# Patient Record
Sex: Male | Born: 1945
Health system: Southern US, Community
[De-identification: ages and names within clinical notes are randomized; demographics above are authoritative.]

## PROBLEM LIST (undated history)

## (undated) ENCOUNTER — Telehealth: Attending: Internal Medicine | Primary: Internal Medicine

## (undated) ENCOUNTER — Ambulatory Visit: Payer: MEDICARE

## (undated) ENCOUNTER — Ambulatory Visit

## (undated) ENCOUNTER — Encounter: Attending: Pharmacist | Primary: Pharmacist

## (undated) ENCOUNTER — Encounter: Attending: Oncology | Primary: Oncology

## (undated) ENCOUNTER — Encounter: Attending: Adult Health | Primary: Adult Health

## (undated) ENCOUNTER — Encounter: Attending: Internal Medicine | Primary: Internal Medicine

## (undated) ENCOUNTER — Telehealth: Attending: Adult Health | Primary: Adult Health

## (undated) ENCOUNTER — Telehealth

## (undated) ENCOUNTER — Encounter

## (undated) ENCOUNTER — Ambulatory Visit: Payer: MEDICARE | Attending: Internal Medicine | Primary: Internal Medicine

## (undated) ENCOUNTER — Ambulatory Visit: Payer: MEDICARE | Attending: Adult Health | Primary: Adult Health

## (undated) ENCOUNTER — Telehealth: Attending: Oncology | Primary: Oncology

## (undated) ENCOUNTER — Telehealth: Attending: Pharmacist | Primary: Pharmacist

## (undated) DIAGNOSIS — I4510 Unspecified right bundle-branch block: Secondary | ICD-10-CM

## (undated) DIAGNOSIS — C801 Malignant (primary) neoplasm, unspecified: Secondary | ICD-10-CM

## (undated) DIAGNOSIS — H409 Unspecified glaucoma: Secondary | ICD-10-CM

## (undated) DIAGNOSIS — N400 Enlarged prostate without lower urinary tract symptoms: Secondary | ICD-10-CM

## (undated) DIAGNOSIS — K5090 Crohn's disease, unspecified, without complications: Secondary | ICD-10-CM

## (undated) DIAGNOSIS — Z8619 Personal history of other infectious and parasitic diseases: Secondary | ICD-10-CM

## (undated) DIAGNOSIS — M109 Gout, unspecified: Secondary | ICD-10-CM

## (undated) DIAGNOSIS — D759 Disease of blood and blood-forming organs, unspecified: Secondary | ICD-10-CM

## (undated) DIAGNOSIS — R040 Epistaxis: Secondary | ICD-10-CM

## (undated) DIAGNOSIS — D7581 Myelofibrosis: Secondary | ICD-10-CM

## (undated) DIAGNOSIS — N189 Chronic kidney disease, unspecified: Secondary | ICD-10-CM

## (undated) HISTORY — DX: Unspecified glaucoma: H40.9

## (undated) HISTORY — DX: Personal history of other infectious and parasitic diseases: Z86.19

## (undated) HISTORY — DX: Crohn's disease, unspecified, without complications: K50.90

## (undated) HISTORY — DX: Benign prostatic hyperplasia without lower urinary tract symptoms: N40.0

## (undated) HISTORY — PX: MOHS SURGERY: SHX181

## (undated) HISTORY — PX: OTHER SURGICAL HISTORY: SHX169

## (undated) HISTORY — DX: Unspecified right bundle-branch block: I45.10

## (undated) HISTORY — DX: Gout, unspecified: M10.9

## (undated) MED ORDER — FOLIC ACID 1 MG TABLET: Freq: Every day | ORAL | 0 days | Status: SS

## (undated) MED ORDER — POSACONAZOLE ORAL
Freq: Every day | ORAL | 0 days
Start: ? — End: 2020-05-14

## (undated) MED ORDER — OMEGA 3-DHA-EPA-FISH OIL 300 MG-1,000 MG CAPSULE: Freq: Every day | ORAL | 0 days | Status: SS

## (undated) MED ORDER — CENTRUM SILVER ULTRA MEN'S 300 MCG-600 MCG-300 MCG TABLET: Freq: Every day | ORAL | 0.00000 days | Status: SS

## (undated) MED ORDER — CYANOCOBALAMIN (VIT B-12) 1,000 MCG TABLET: Freq: Every day | ORAL | 0 days

## (undated) MED ORDER — MIDODRINE 5 MG TABLET: Freq: Three times a day (TID) | ORAL | 0 days

## (undated) MED ORDER — ASPIRIN 81 MG CHEWABLE TABLET: ORAL | 0 days

## (undated) MED ORDER — POTASSIUM CHLORIDE ER 10 MEQ TABLET,EXTENDED RELEASE(PART/CRYST): Freq: Every evening | ORAL | 0.00000 days

## (undated) MED ORDER — CHOLECALCIFEROL (VITAMIN D3) 25 MCG (1,000 UNIT) TABLET: Freq: Every day | ORAL | 0 days | Status: SS

## (undated) MED ORDER — POTASSIUM CHLORIDE ER 20 MEQ TABLET,EXTENDED RELEASE(PART/CRYST): Freq: Every day | ORAL | 0 days

---

## 2006-09-22 HISTORY — PX: PROSTATE SURGERY: SHX751

## 2011-05-27 ENCOUNTER — Ambulatory Visit: Payer: Self-pay

## 2011-06-05 ENCOUNTER — Ambulatory Visit: Payer: Self-pay | Admitting: Family Medicine

## 2011-08-08 ENCOUNTER — Ambulatory Visit: Payer: Self-pay | Admitting: Unknown Physician Specialty

## 2011-08-08 LAB — HM COLONOSCOPY

## 2011-08-11 LAB — PATHOLOGY REPORT

## 2012-05-03 DIAGNOSIS — Z1159 Encounter for screening for other viral diseases: Secondary | ICD-10-CM | POA: Diagnosis not present

## 2012-05-03 DIAGNOSIS — Z136 Encounter for screening for cardiovascular disorders: Secondary | ICD-10-CM | POA: Diagnosis not present

## 2012-05-03 DIAGNOSIS — Z131 Encounter for screening for diabetes mellitus: Secondary | ICD-10-CM | POA: Diagnosis not present

## 2012-05-03 DIAGNOSIS — I4510 Unspecified right bundle-branch block: Secondary | ICD-10-CM | POA: Insufficient documentation

## 2012-05-03 DIAGNOSIS — Z0000 Encounter for general adult medical examination without abnormal findings: Secondary | ICD-10-CM | POA: Diagnosis not present

## 2012-05-03 DIAGNOSIS — Z23 Encounter for immunization: Secondary | ICD-10-CM | POA: Diagnosis not present

## 2012-05-03 DIAGNOSIS — Z125 Encounter for screening for malignant neoplasm of prostate: Secondary | ICD-10-CM | POA: Diagnosis not present

## 2012-05-05 DIAGNOSIS — Z85828 Personal history of other malignant neoplasm of skin: Secondary | ICD-10-CM | POA: Diagnosis not present

## 2012-05-05 DIAGNOSIS — L570 Actinic keratosis: Secondary | ICD-10-CM | POA: Diagnosis not present

## 2012-05-10 ENCOUNTER — Ambulatory Visit: Payer: Self-pay | Admitting: Family Medicine

## 2012-05-10 DIAGNOSIS — Z139 Encounter for screening, unspecified: Secondary | ICD-10-CM | POA: Diagnosis not present

## 2012-05-10 DIAGNOSIS — Z136 Encounter for screening for cardiovascular disorders: Secondary | ICD-10-CM | POA: Diagnosis not present

## 2012-06-18 DIAGNOSIS — Z23 Encounter for immunization: Secondary | ICD-10-CM | POA: Diagnosis not present

## 2012-07-14 DIAGNOSIS — H4010X0 Unspecified open-angle glaucoma, stage unspecified: Secondary | ICD-10-CM | POA: Diagnosis not present

## 2012-08-24 ENCOUNTER — Emergency Department: Payer: Self-pay | Admitting: Emergency Medicine

## 2012-08-24 DIAGNOSIS — R109 Unspecified abdominal pain: Secondary | ICD-10-CM | POA: Diagnosis not present

## 2012-08-24 DIAGNOSIS — K5090 Crohn's disease, unspecified, without complications: Secondary | ICD-10-CM | POA: Diagnosis not present

## 2012-08-24 DIAGNOSIS — R112 Nausea with vomiting, unspecified: Secondary | ICD-10-CM | POA: Diagnosis not present

## 2012-08-24 DIAGNOSIS — D72829 Elevated white blood cell count, unspecified: Secondary | ICD-10-CM | POA: Diagnosis not present

## 2012-08-24 LAB — CBC
HCT: 59.8 % — ABNORMAL HIGH (ref 40.0–52.0)
HGB: 19.3 g/dL — ABNORMAL HIGH (ref 13.0–18.0)
MCH: 27.1 pg (ref 26.0–34.0)
MCHC: 32.4 g/dL (ref 32.0–36.0)
Platelet: 551 10*3/uL — ABNORMAL HIGH (ref 150–440)
WBC: 21.8 10*3/uL — ABNORMAL HIGH (ref 3.8–10.6)

## 2012-08-24 LAB — COMPREHENSIVE METABOLIC PANEL
Albumin: 4.1 g/dL (ref 3.4–5.0)
Alkaline Phosphatase: 155 U/L — ABNORMAL HIGH (ref 50–136)
Calcium, Total: 9.6 mg/dL (ref 8.5–10.1)
Chloride: 108 mmol/L — ABNORMAL HIGH (ref 98–107)
Co2: 21 mmol/L (ref 21–32)
EGFR (African American): >60
Glucose: 113 mg/dL — ABNORMAL HIGH (ref 65–99)
Osmolality: 277 (ref 275–301)
Potassium: 4.2 mmol/L (ref 3.5–5.1)
SGOT(AST): 42 U/L — ABNORMAL HIGH (ref 15–37)
SGPT (ALT): 41 U/L (ref 12–78)
Sodium: 137 mmol/L (ref 136–145)

## 2012-08-24 LAB — URINALYSIS, COMPLETE
Bilirubin,UR: NEGATIVE
Blood: NEGATIVE
Glucose,UR: NEGATIVE mg/dL (ref 0–75)
Leukocyte Esterase: NEGATIVE
Nitrite: NEGATIVE
RBC,UR: 4 /HPF (ref 0–5)
Specific Gravity: 1.021 (ref 1.003–1.030)

## 2012-08-24 LAB — LIPASE, BLOOD: Lipase: 168 U/L (ref 73–393)

## 2012-08-27 DIAGNOSIS — K5090 Crohn's disease, unspecified, without complications: Secondary | ICD-10-CM | POA: Diagnosis not present

## 2013-10-12 DIAGNOSIS — D485 Neoplasm of uncertain behavior of skin: Secondary | ICD-10-CM | POA: Diagnosis not present

## 2013-10-12 DIAGNOSIS — Z85828 Personal history of other malignant neoplasm of skin: Secondary | ICD-10-CM | POA: Diagnosis not present

## 2013-10-12 DIAGNOSIS — L570 Actinic keratosis: Secondary | ICD-10-CM | POA: Diagnosis not present

## 2013-10-25 DIAGNOSIS — H4010X0 Unspecified open-angle glaucoma, stage unspecified: Secondary | ICD-10-CM | POA: Diagnosis not present

## 2013-11-01 DIAGNOSIS — C44320 Squamous cell carcinoma of skin of unspecified parts of face: Secondary | ICD-10-CM | POA: Diagnosis not present

## 2013-12-01 ENCOUNTER — Ambulatory Visit: Payer: Self-pay | Admitting: Family Medicine

## 2013-12-01 DIAGNOSIS — M19079 Primary osteoarthritis, unspecified ankle and foot: Secondary | ICD-10-CM | POA: Diagnosis not present

## 2013-12-01 DIAGNOSIS — M25579 Pain in unspecified ankle and joints of unspecified foot: Secondary | ICD-10-CM | POA: Diagnosis not present

## 2013-12-01 DIAGNOSIS — D1630 Benign neoplasm of short bones of unspecified lower limb: Secondary | ICD-10-CM | POA: Diagnosis not present

## 2013-12-01 DIAGNOSIS — Z125 Encounter for screening for malignant neoplasm of prostate: Secondary | ICD-10-CM | POA: Diagnosis not present

## 2013-12-01 DIAGNOSIS — M79609 Pain in unspecified limb: Secondary | ICD-10-CM | POA: Diagnosis not present

## 2013-12-28 DIAGNOSIS — M76829 Posterior tibial tendinitis, unspecified leg: Secondary | ICD-10-CM | POA: Diagnosis not present

## 2013-12-28 DIAGNOSIS — M109 Gout, unspecified: Secondary | ICD-10-CM | POA: Diagnosis not present

## 2014-01-04 DIAGNOSIS — M76829 Posterior tibial tendinitis, unspecified leg: Secondary | ICD-10-CM | POA: Diagnosis not present

## 2014-01-04 DIAGNOSIS — M109 Gout, unspecified: Secondary | ICD-10-CM | POA: Diagnosis not present

## 2014-01-19 DIAGNOSIS — M76829 Posterior tibial tendinitis, unspecified leg: Secondary | ICD-10-CM | POA: Diagnosis not present

## 2014-01-19 DIAGNOSIS — M25579 Pain in unspecified ankle and joints of unspecified foot: Secondary | ICD-10-CM | POA: Diagnosis not present

## 2014-01-30 DIAGNOSIS — Z85828 Personal history of other malignant neoplasm of skin: Secondary | ICD-10-CM | POA: Diagnosis not present

## 2014-01-30 DIAGNOSIS — L570 Actinic keratosis: Secondary | ICD-10-CM | POA: Diagnosis not present

## 2014-02-06 DIAGNOSIS — M76829 Posterior tibial tendinitis, unspecified leg: Secondary | ICD-10-CM | POA: Diagnosis not present

## 2014-02-06 DIAGNOSIS — M25579 Pain in unspecified ankle and joints of unspecified foot: Secondary | ICD-10-CM | POA: Diagnosis not present

## 2014-04-10 ENCOUNTER — Ambulatory Visit: Payer: Self-pay | Admitting: Family Medicine

## 2014-04-10 DIAGNOSIS — Z23 Encounter for immunization: Secondary | ICD-10-CM | POA: Diagnosis not present

## 2014-04-10 DIAGNOSIS — Z125 Encounter for screening for malignant neoplasm of prostate: Secondary | ICD-10-CM | POA: Diagnosis not present

## 2014-04-10 DIAGNOSIS — M549 Dorsalgia, unspecified: Secondary | ICD-10-CM | POA: Diagnosis not present

## 2014-04-10 DIAGNOSIS — Z131 Encounter for screening for diabetes mellitus: Secondary | ICD-10-CM | POA: Diagnosis not present

## 2014-04-10 DIAGNOSIS — M5137 Other intervertebral disc degeneration, lumbosacral region: Secondary | ICD-10-CM | POA: Diagnosis not present

## 2014-04-10 DIAGNOSIS — N419 Inflammatory disease of prostate, unspecified: Secondary | ICD-10-CM | POA: Diagnosis not present

## 2014-04-10 DIAGNOSIS — Z0000 Encounter for general adult medical examination without abnormal findings: Secondary | ICD-10-CM | POA: Diagnosis not present

## 2014-04-10 LAB — PSA: PSA: 2.1

## 2014-04-10 LAB — BASIC METABOLIC PANEL: GLUCOSE: 81 mg/dL

## 2014-05-02 DIAGNOSIS — H4010X0 Unspecified open-angle glaucoma, stage unspecified: Secondary | ICD-10-CM | POA: Diagnosis not present

## 2014-06-13 ENCOUNTER — Ambulatory Visit: Payer: Self-pay | Admitting: Physician Assistant

## 2014-06-13 DIAGNOSIS — L02619 Cutaneous abscess of unspecified foot: Secondary | ICD-10-CM | POA: Diagnosis not present

## 2014-06-13 DIAGNOSIS — L03119 Cellulitis of unspecified part of limb: Secondary | ICD-10-CM | POA: Diagnosis not present

## 2014-06-23 DIAGNOSIS — Z23 Encounter for immunization: Secondary | ICD-10-CM | POA: Diagnosis not present

## 2014-10-02 DIAGNOSIS — L821 Other seborrheic keratosis: Secondary | ICD-10-CM | POA: Diagnosis not present

## 2014-10-02 DIAGNOSIS — Z85828 Personal history of other malignant neoplasm of skin: Secondary | ICD-10-CM | POA: Diagnosis not present

## 2014-10-02 DIAGNOSIS — D485 Neoplasm of uncertain behavior of skin: Secondary | ICD-10-CM | POA: Diagnosis not present

## 2014-10-02 DIAGNOSIS — C44319 Basal cell carcinoma of skin of other parts of face: Secondary | ICD-10-CM | POA: Diagnosis not present

## 2014-10-02 DIAGNOSIS — X32XXXA Exposure to sunlight, initial encounter: Secondary | ICD-10-CM | POA: Diagnosis not present

## 2014-10-02 DIAGNOSIS — L570 Actinic keratosis: Secondary | ICD-10-CM | POA: Diagnosis not present

## 2014-10-16 DIAGNOSIS — L570 Actinic keratosis: Secondary | ICD-10-CM | POA: Diagnosis not present

## 2014-10-31 DIAGNOSIS — H4011X1 Primary open-angle glaucoma, mild stage: Secondary | ICD-10-CM | POA: Diagnosis not present

## 2014-11-15 DIAGNOSIS — L570 Actinic keratosis: Secondary | ICD-10-CM | POA: Diagnosis not present

## 2015-01-01 DIAGNOSIS — L814 Other melanin hyperpigmentation: Secondary | ICD-10-CM | POA: Diagnosis not present

## 2015-01-01 DIAGNOSIS — L908 Other atrophic disorders of skin: Secondary | ICD-10-CM | POA: Diagnosis not present

## 2015-01-01 DIAGNOSIS — C44219 Basal cell carcinoma of skin of left ear and external auricular canal: Secondary | ICD-10-CM | POA: Diagnosis not present

## 2015-01-01 DIAGNOSIS — L578 Other skin changes due to chronic exposure to nonionizing radiation: Secondary | ICD-10-CM | POA: Diagnosis not present

## 2015-02-22 DIAGNOSIS — K5010 Crohn's disease of large intestine without complications: Secondary | ICD-10-CM | POA: Diagnosis not present

## 2015-02-22 DIAGNOSIS — K5090 Crohn's disease, unspecified, without complications: Secondary | ICD-10-CM | POA: Diagnosis not present

## 2015-02-26 DIAGNOSIS — K5090 Crohn's disease, unspecified, without complications: Secondary | ICD-10-CM | POA: Diagnosis not present

## 2015-03-06 ENCOUNTER — Telehealth: Payer: Self-pay | Admitting: Family Medicine

## 2015-03-06 DIAGNOSIS — D751 Secondary polycythemia: Secondary | ICD-10-CM

## 2015-03-06 NOTE — Telephone Encounter (Signed)
Pt called because Dr. Edd Fabian office told pt they were sending [place] his labs results to see if Dr. Caryn Section felt they needed further review or if pt should go to a specialist. Pt stated he was concerned and wanted to know if he should come in for an OV or what Dr. Caryn Section thinks about the results. Thanks TNP

## 2015-03-06 NOTE — Telephone Encounter (Signed)
Please advise 

## 2015-03-06 NOTE — Telephone Encounter (Signed)
Please refer to hematology for evaluation of polycythemia. Please send note from [facility] from 02-26-15. Thanks.

## 2015-03-14 ENCOUNTER — Ambulatory Visit: Payer: Self-pay | Admitting: Hematology and Oncology

## 2015-03-27 ENCOUNTER — Telehealth: Payer: Self-pay | Admitting: Family Medicine

## 2015-03-28 ENCOUNTER — Encounter: Payer: Self-pay | Admitting: Family Medicine

## 2015-03-28 ENCOUNTER — Inpatient Hospital Stay: Payer: Medicare Other

## 2015-03-28 ENCOUNTER — Inpatient Hospital Stay: Payer: Medicare Other | Attending: Hematology and Oncology | Admitting: Hematology and Oncology

## 2015-03-28 ENCOUNTER — Encounter: Payer: Self-pay | Admitting: Hematology and Oncology

## 2015-03-28 VITALS — BP 146/82 | HR 71 | Temp 96.0°F | Resp 19 | Ht 72.0 in | Wt 178.5 lb

## 2015-03-28 DIAGNOSIS — D751 Secondary polycythemia: Secondary | ICD-10-CM | POA: Diagnosis not present

## 2015-03-28 DIAGNOSIS — K5090 Crohn's disease, unspecified, without complications: Secondary | ICD-10-CM | POA: Diagnosis not present

## 2015-03-28 DIAGNOSIS — R634 Abnormal weight loss: Secondary | ICD-10-CM | POA: Diagnosis not present

## 2015-03-28 DIAGNOSIS — Z148 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: Secondary | ICD-10-CM | POA: Diagnosis not present

## 2015-03-28 DIAGNOSIS — R61 Generalized hyperhidrosis: Secondary | ICD-10-CM | POA: Insufficient documentation

## 2015-03-28 DIAGNOSIS — Z79899 Other long term (current) drug therapy: Secondary | ICD-10-CM | POA: Insufficient documentation

## 2015-03-28 DIAGNOSIS — Z87891 Personal history of nicotine dependence: Secondary | ICD-10-CM | POA: Diagnosis not present

## 2015-03-28 LAB — CBC WITH DIFFERENTIAL/PLATELET
Basophils Absolute: 0.1 10*3/uL (ref 0–0.1)
Basophils Relative: 0 %
Eosinophils Absolute: 0.1 10*3/uL (ref 0–0.7)
Eosinophils Relative: 1 %
HCT: 62.1 % — ABNORMAL HIGH (ref 40.0–52.0)
Hemoglobin: 19.8 g/dL — ABNORMAL HIGH (ref 13.0–18.0)
Lymphocytes Relative: 10 %
Lymphs Abs: 1.2 10*3/uL (ref 1.0–3.6)
MCH: 25.1 pg — ABNORMAL LOW (ref 26.0–34.0)
MCHC: 31.9 g/dL — ABNORMAL LOW (ref 32.0–36.0)
MCV: 78.8 fL — ABNORMAL LOW (ref 80.0–100.0)
Monocytes Absolute: 1.5 10*3/uL — ABNORMAL HIGH (ref 0.2–1.0)
Monocytes Relative: 12 %
Neutro Abs: 9.4 10*3/uL — ABNORMAL HIGH (ref 1.4–6.5)
Neutrophils Relative %: 77 %
Platelets: 269 10*3/uL (ref 150–440)
RBC: 7.88 MIL/uL — ABNORMAL HIGH (ref 4.40–5.90)
RDW: 20.5 % — ABNORMAL HIGH (ref 11.5–14.5)
WBC: 12.3 10*3/uL — ABNORMAL HIGH (ref 3.8–10.6)

## 2015-03-28 NOTE — Progress Notes (Signed)
[place] [facility] day:  03/28/2015  Chief Complaint: Ruben [name] is a 69 y.o. male with polycythemia who is referred in consultation by Dr. Lelon Huh.  HPI: The patient has blood checked once a year by his primary care physician. Labs on 02/22/2015 revealed a hematocrit greater than 60, hemoglobin 19.6, platelets 298,000, and white count 13,700 with an [place] of 9720. Differential included 71% segs, 10% lymphs, 18% monocytes, and 1% eosinophils. Comprehensive metabolic panel revealed a creatinine of 1.2.  Alkaline phosphatase was slightly elevated at 100 (34-104).   Iron saturation was 17%.  TIBCwas 344 (normal).  Ferritin was 45. 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  Review of available labs notes a hematocrit of 59.8 and a hemoglobin of 19.3 on 08/24/2012.  The patient notes a history of regular blood donation at the [facility] in the past. He stopped donating blood about 6-7 years ago. He states that he has taken iron for years. He has also taken B12 a long time (since his diagnosis of Crohn's disease 28 years ago).   Symptomatically, he sweats a lot with working. He denies any respiratory symptoms. He has a 1-1/2 pack per week smoking history for 20 years (off-and-on).  He occasionally snores, but denies sleep apnea. He denies any cardiac history.  He denies any use of testosterone.  He has lost approximately 40 pounds since retirement. He has been more active with an improved diet. His weight has stabilized.  Past Medical History  Diagnosis Date  . Glaucoma   . Crohn's disease     No past surgical history on file.  Family History  Problem Relation Age of Onset  . Cancer Mother     Liver  . Cancer Father     Prostate  . Cancer Sister     Hodgkins lymphoma  . Cancer Paternal Aunt     Breast    Social History:  reports that he quit smoking about 11 years ago. His  smoking use included Cigarettes. He has a 5 pack-year smoking history. He does not have any smokeless tobacco history on file. He reports that he drinks about 1.2 oz of alcohol per week. He reports that he does not use illicit drugs.  He previously worked for [employer].  He volunteers for [employer].  The patient is accompanied by his wife, [name], today.  Allergies: No Known Allergies  Current Medications: Current Outpatient Prescriptions  Medication Sig Dispense Refill  . Cholecalciferol (VITAMIN D3) 1000 UNITS CAPS Take by mouth.    . Cyanocobalamin (RA VITAMIN B-12 TR) 1000 MCG TBCR Place one tablet under tongue once a day as needed.    . ferrous sulfate 325 (65 FE) MG tablet Take by mouth.    . folic acid (FOLVITE) 1 MG tablet TAKE 1 TABLET (1,000 MCG TOTAL) BY MOUTH ONCE DAILY. MUST KEEP APPT FOR FURTHER REFILLS.    Marland Kitchen latanoprost (XALATAN) 0.005 % ophthalmic solution Apply to eye.    . Multiple Vitamins-Minerals (CENTRUM SILVER ULTRA MENS) TABS Take by mouth.    . Omega-3 Fatty Acids (KP FISH OIL) 1200 MG CAPS Take by mouth.     No current facility-administered medications for this visit.    Review of Systems:  GENERAL:  Feels good.  Active.  No fevers or sweats.  Weight loss from 220# to 178# after retirement. PERFORMANCE STATUS (ECOG):  0 HEENT:  Runny nose secondary to  pollen.  No visual changes, sore throat, mouth sores or tenderness. Lungs: No shortness of breath or cough.  No hemoptysis. Cardiac:  No chest pain, palpitations, orthopnea, or PND. GI:  Diarrhea on occasion.  No nausea, vomiting, constipation, melena or hematochezia. GU:  No urgency, frequency, dysuria, or hematuria. Musculoskeletal:  No back pain.  Joints stiff at times.  No muscle tenderness. Extremities:  No pain or swelling. Skin:  Red face with working.  No rashes or skin changes. Neuro:  Occasional tingle in toes.  No headache, numbness or weakness, balance or coordination issues. Endocrine:   No diabetes, thyroid issues, hot flashes or night sweats. Psych:  No mood changes, depression or anxiety. Pain:  No focal pain. Review of systems:  All other systems reviewed and found to be negative.   Physical Exam: Blood pressure 146/82, pulse 71, temperature 96 F (35.6 C), temperature source Oral, resp. rate 19, height 6' (1.829 m), weight 178 lb 7.4 oz (80.95 kg), SpO2 96 %. GENERAL:  Well developed, well nourished, sitting comfortably in the exam room in no acute distress. MENTAL STATUS:  Alert and oriented to person, place and time. HEAD:  Pearline Cables hair.  Normocephalic, atraumatic, face symmetric, no Cushingoid features. EYES:  Glasses.  Blue eyes.  Pupils equal round and reactive to light and accomodation.  No conjunctivitis or scleral icterus. ENT:  Oropharynx clear without lesion.  Tongue normal. Mucous membranes moist.  RESPIRATORY:  Clear to auscultation without rales, wheezes or rhonchi. CARDIOVASCULAR:  Regular rate and rhythm without murmur, rub or gallop. ABDOMEN:  Soft, non-tender, with active bowel sounds, and no hepatosplenomegaly.  No masses. SKIN:  No rashes, ulcers or lesions. EXTREMITIES: No edema, no skin discoloration or tenderness.  No palpable cords. LYMPH NODES: No palpable cervical, supraclavicular, axillary or inguinal adenopathy  NEUROLOGICAL: Unremarkable. PSYCH:  Appropriate.   Appointment on 03/28/2015  Component Date Value Ref Range Status  . WBC 03/28/2015 12.3* 3.8 - 10.6 K/uL Final  . RBC 03/28/2015 7.88* 4.40 - 5.90 MIL/uL Final  . Hemoglobin 03/28/2015 19.8* 13.0 - 18.0 g/dL Final  . HCT 03/28/2015 62.1* 40.0 - 52.0 % Final  . MCV 03/28/2015 78.8* 80.0 - 100.0 fL Final  . MCH 03/28/2015 25.1* 26.0 - 34.0 pg Final  . MCHC 03/28/2015 31.9* 32.0 - 36.0 g/dL Final  . RDW 03/28/2015 20.5* 11.5 - 14.5 % Final  . Platelets 03/28/2015 269  150 - 440 K/uL Final  . Neutrophils Relative % 03/28/2015 77   Final  . Neutro Abs 03/28/2015 9.4* 1.4 - 6.5 K/uL  Final  . Lymphocytes Relative 03/28/2015 10   Final  . Lymphs Abs 03/28/2015 1.2  1.0 - 3.6 K/uL Final  . Monocytes Relative 03/28/2015 12   Final  . Monocytes Absolute 03/28/2015 1.5* 0.2 - 1.0 K/uL Final  . Eosinophils Relative 03/28/2015 1   Final  . Eosinophils Absolute 03/28/2015 0.1  0 - 0.7 K/uL Final  . Basophils Relative 03/28/2015 0   Final  . Basophils Absolute 03/28/2015 0.1  0 - 0.1 K/uL Final  . [facility] test code 03/28/2015 7187   Final  . [facility] test name 03/28/2015 CARBOXYHEMOGLOBIN   Final  . Misc [facility] result 03/28/2015 COMMENT   Final   Comment: (NOTE) Test Ordered: [id number] Carbon Monoxide, Blood Carbon Monoxide, Blood         2.6         [H ] %        BN     Reference Range:  0.0-1.9                                                          Environmental Exposure:                             Nonsmokers           <2.0                             Smokers              <9.0                            Occupational Exposure:                             BEI                   3.5                                Detection Limit =  0.2 Performed At: [facility] [address] [place], [place] [id number] Lindon Romp MD VW:[id number]     Assessment:  Ruben [name] is a 69 y.o. male with polycythemia dating back to 2013.  He has a minimal smoking history.  He denies any sleep apnea.  He has no known cardiac abnormalities (no exercise intolerance).  He does not take testosterone.  He previously donated blood at the [facility] (none in past 6-7 years).  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  Symptomatically, he feels well.  Exam is unremarkable.  Plan: 1. Discuss diagnosis of polycythemia and differential diagnosis.  Discuss likely diagnosis of PV and treatment (phlebotomy to maintain HCT < 45). 2. Discuss carrier state for hemochromatosis.  Discuss genetics.  No intervention necessary. 3. Labs today:  CBC  with diff, epo level, JAK2 with reflex, and carboxyhemoglobin. 4. Discontinue oral iron. 5. Phlebotomy 500 cc today. 6. RTC in 1 week for MD assess, review of work-up, labs (CBC) and +/- phlebotomy.  Lequita Asal, MD  03/28/2015, 10:00 AM

## 2015-03-29 LAB — MISC LABCORP TEST (SEND OUT): Labcorp test code: 7187

## 2015-04-01 ENCOUNTER — Encounter: Payer: Self-pay | Admitting: Hematology and Oncology

## 2015-04-02 LAB — JAK2  V617F QUAL. WITH REFLEX TO EXON 12

## 2015-04-02 LAB — ERYTHROPOIETIN: Erythropoietin: 1.1 m[IU]/mL — ABNORMAL LOW (ref 2.6–18.5)

## 2015-04-04 ENCOUNTER — Inpatient Hospital Stay: Payer: Medicare Other

## 2015-04-04 ENCOUNTER — Inpatient Hospital Stay (HOSPITAL_BASED_OUTPATIENT_CLINIC_OR_DEPARTMENT_OTHER): Payer: Medicare Other | Admitting: Hematology and Oncology

## 2015-04-04 ENCOUNTER — Other Ambulatory Visit: Payer: Self-pay

## 2015-04-04 VITALS — BP 134/73 | HR 58 | Temp 96.9°F | Resp 20 | Ht 72.0 in | Wt 177.5 lb

## 2015-04-04 DIAGNOSIS — Z148 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: Secondary | ICD-10-CM

## 2015-04-04 DIAGNOSIS — Z87891 Personal history of nicotine dependence: Secondary | ICD-10-CM

## 2015-04-04 DIAGNOSIS — D45 Polycythemia vera: Secondary | ICD-10-CM

## 2015-04-04 DIAGNOSIS — R634 Abnormal weight loss: Secondary | ICD-10-CM

## 2015-04-04 DIAGNOSIS — D751 Secondary polycythemia: Secondary | ICD-10-CM

## 2015-04-04 DIAGNOSIS — K5090 Crohn's disease, unspecified, without complications: Secondary | ICD-10-CM

## 2015-04-04 DIAGNOSIS — Z79899 Other long term (current) drug therapy: Secondary | ICD-10-CM

## 2015-04-04 DIAGNOSIS — R61 Generalized hyperhidrosis: Secondary | ICD-10-CM

## 2015-04-04 LAB — CBC WITH DIFFERENTIAL/PLATELET
Basophils Absolute: 0 10*3/uL (ref 0–0.1)
Basophils Relative: 0 %
Eosinophils Absolute: 0.1 10*3/uL (ref 0–0.7)
Eosinophils Relative: 1 %
HCT: 56.9 % — ABNORMAL HIGH (ref 40.0–52.0)
Hemoglobin: 18.2 g/dL — ABNORMAL HIGH (ref 13.0–18.0)
Lymphocytes Relative: 10 %
Lymphs Abs: 1.3 10*3/uL (ref 1.0–3.6)
MCH: 25.4 pg — ABNORMAL LOW (ref 26.0–34.0)
MCHC: 32 g/dL (ref 32.0–36.0)
MCV: 79.4 fL — ABNORMAL LOW (ref 80.0–100.0)
Monocytes Absolute: 1.7 10*3/uL — ABNORMAL HIGH (ref 0.2–1.0)
Monocytes Relative: 13 %
Neutro Abs: 10.2 10*3/uL — ABNORMAL HIGH (ref 1.4–6.5)
Neutrophils Relative %: 76 %
Platelets: 289 10*3/uL (ref 150–440)
RBC: 7.17 MIL/uL — ABNORMAL HIGH (ref 4.40–5.90)
RDW: 20.4 % — ABNORMAL HIGH (ref 11.5–14.5)
WBC: 13.3 10*3/uL — ABNORMAL HIGH (ref 3.8–10.6)

## 2015-04-04 NOTE — Progress Notes (Signed)
[facility] day:  04/04/2015  Chief Complaint: Ruben [name] is a 69 y.o. male with polycythemia who is seen for review of work-up and discussion regarding direction of therapy.  HPI: The patient  was last seen in the medical oncology clinic on 03/28/2015 for initial consultation for polycythemia.  He had polycythemia dating back to at least 2013.  He had a minimal smoking history.  He denied any sleep apnea.  He had no known cardiac abnormalities (no exercise intolerance).  He did not take testosterone.  He previously donated blood at the [employer] (none in past 6-7 years).  Hematocrit was 62.1 with a hemoglobin of 19.8.  White blood cell count was 12,300 and platelets were 269,000.  He was felt likely to have polycythemia rubra vera (PV).  He underwent a work-up.  Evalution confirmed polycythemia rubra vera.  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  Erythropoietin level was 1.1 (low).  Carboxyhemoglobin was slightly elevated at 2.6% (< 2%).  He underwent phlebotomy at last visit given his high hematocrit and likely diagnosis of PV.  He tolerated his phlebotomy well.  He denies any new complaints.  Past Medical History  Diagnosis Date  . Glaucoma   . Crohn's disease     No past surgical history on file.  Family History  Problem Relation Age of Onset  . Cancer Mother     Liver  . Cancer Father     Prostate  . Cancer Sister     Hodgkins lymphoma  . Cancer Paternal Aunt     Breast    Social History:  reports that he quit smoking about 11 years ago. His smoking use included Cigarettes. He has a 5 pack-year smoking history. He does not have any smokeless tobacco history on file. He reports that he drinks about 1.2 oz of alcohol per week. He reports that he does not use illicit drugs.  He previously worked for [employer].  He volunteers for [employer].  The patient is accompanied by his wife, [name], today.  Allergies: No  Known Allergies  Current Medications: Current Outpatient Prescriptions  Medication Sig Dispense Refill  . Cholecalciferol (VITAMIN D3) 1000 UNITS CAPS Take by mouth.    . Cyanocobalamin (RA VITAMIN B-12 TR) 1000 MCG TBCR Place one tablet under tongue once a day as needed.    . folic acid (FOLVITE) 1 MG tablet TAKE 1 TABLET (1,000 MCG TOTAL) BY MOUTH ONCE DAILY. MUST KEEP APPT FOR FURTHER REFILLS.    Marland Kitchen latanoprost (XALATAN) 0.005 % ophthalmic solution Apply to eye.    . Multiple Vitamins-Minerals (CENTRUM SILVER ULTRA MENS) TABS Take by mouth.    . Omega-3 Fatty Acids (KP FISH OIL) 1200 MG CAPS Take by mouth.     No current facility-administered medications for this visit.    Review of Systems:  GENERAL:  Feels good.  Active.  No fevers or sweats.  Weight stable. PERFORMANCE STATUS (ECOG):  0 HEENT:  Runny nose secondary to pollen.  No visual changes, sore throat, mouth sores or tenderness. Lungs: No shortness of breath or cough.  No hemoptysis. Cardiac:  No chest pain, palpitations, orthopnea, or PND. GI:  Diarrhea on occasion.  No nausea, vomiting, constipation, melena or hematochezia. GU:  No urgency, frequency, dysuria, or hematuria. Musculoskeletal:  No back pain.  Joints stiff at times.  No muscle tenderness. Extremities:  No pain or swelling. Skin:  No rashes or skin changes. Neuro:  Occasional tingle in toes.  No headache, numbness or weakness, balance or coordination issues. Endocrine:  No diabetes, thyroid issues, hot flashes or night sweats. Psych:  No mood changes, depression or anxiety. Pain:  No focal pain. Review of systems:  All other systems reviewed and found to be negative.   Physical Exam: Blood pressure 134/73, pulse 58, temperature 96.9 F (36.1 C), temperature source Tympanic, resp. rate 20, height 6' (1.829 m), weight 177 lb 7.5 oz (80.499 kg), SpO2 97 %. GENERAL:  Well developed, well nourished, sitting comfortably in the exam room in no acute  distress. MENTAL STATUS:  Alert and oriented to person, place and time. HEAD:  Pearline Cables hair.  Normocephalic, atraumatic, face symmetric, no Cushingoid features. EYES:  Glasses.  Blue eyes.  No conjunctivitis or scleral icterus. RESPIRATORY:  Clear to auscultation without rales, wheezes or rhonchi. CARDIOVASCULAR:  Regular rate and rhythm without murmur, rub or gallop. SKIN:  No rashes, ulcers or lesions. EXTREMITIES: No edema, no skin discoloration or tenderness.  No palpable cords. NEUROLOGICAL: Unremarkable. PSYCH:  Appropriate.   Appointment on 04/04/2015  Component Date Value Ref Range Status  . WBC 04/04/2015 13.3* 3.8 - 10.6 K/uL Final  . RBC 04/04/2015 7.17* 4.40 - 5.90 MIL/uL Final  . Hemoglobin 04/04/2015 18.2* 13.0 - 18.0 g/dL Final  . HCT 04/04/2015 56.9* 40.0 - 52.0 % Final  . MCV 04/04/2015 79.4* 80.0 - 100.0 fL Final  . MCH 04/04/2015 25.4* 26.0 - 34.0 pg Final  . MCHC 04/04/2015 32.0  32.0 - 36.0 g/dL Final  . RDW 04/04/2015 20.4* 11.5 - 14.5 % Final  . Platelets 04/04/2015 289  150 - 440 K/uL Final  . Neutrophils Relative % 04/04/2015 PENDING  43 - 77 % Incomplete  . Neutro Abs 04/04/2015 PENDING  1.7 - 7.7 K/uL Incomplete  . Band Neutrophils 04/04/2015 PENDING  0 - 10 % Incomplete  . Lymphocytes Relative 04/04/2015 PENDING  12 - 46 % Incomplete  . Lymphs Abs 04/04/2015 PENDING  0.7 - 4.0 K/uL Incomplete  . Monocytes Relative 04/04/2015 PENDING  3 - 12 % Incomplete  . Monocytes Absolute 04/04/2015 PENDING  0.1 - 1.0 K/uL Incomplete  . Eosinophils Relative 04/04/2015 PENDING  0 - 5 % Incomplete  . Eosinophils Absolute 04/04/2015 PENDING  0.0 - 0.7 K/uL Incomplete  . Basophils Relative 04/04/2015 PENDING  0 - 1 % Incomplete  . Basophils Absolute 04/04/2015 PENDING  0.0 - 0.1 K/uL Incomplete  . WBC Morphology 04/04/2015 PENDING   Incomplete  . RBC Morphology 04/04/2015 PENDING   Incomplete  . Smear Review 04/04/2015 PENDING   Incomplete  . Other 04/04/2015 PENDING    Incomplete  . nRBC 04/04/2015 PENDING  0 /100 WBC Incomplete  . Metamyelocytes Relative 04/04/2015 PENDING   Incomplete  . Myelocytes 04/04/2015 PENDING   Incomplete  . Promyelocytes Absolute 04/04/2015 PENDING   Incomplete  . 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  He has had polycythemia dating back to 2013.  Hematocrit was 62.1 with a hemoglobin of 19.8 on 03/28/2015.  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  Erythropoietin level was 1.1 (low).  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  He underwent phlebotomy on 03/28/2015.  Hematocrit goal is < 45.  Symptomatically, he feels well.  Exam is unremarkable.  Plan: 1. Discuss diagnosis of polycythemia natural history.  Discuss phlebotomy to maintain HCT < 45.  Begin baby  aspirin once a day.  Discuss potential use of hydroxyurea depending on other counts. 2. Labs today:  CBC with diff. 3. Phlebotomy 500 cc today. 4. Begin aspirin 81 mg a day. 5. Discuss avoiding dehydration. 6. RTC weekly for CBC +/- phlebotomy (500 cc) if HCT > 45. 7. RTC in 1 month for MD assess, labs (CBC with diff, ferritin, iron studies) and +/- phlebotomy.   Lequita Asal, MD  04/04/2015

## 2015-04-11 ENCOUNTER — Inpatient Hospital Stay: Payer: Medicare Other | Admitting: Hematology and Oncology

## 2015-04-11 ENCOUNTER — Inpatient Hospital Stay: Payer: Medicare Other

## 2015-04-11 ENCOUNTER — Ambulatory Visit: Payer: Medicare Other

## 2015-04-11 DIAGNOSIS — D45 Polycythemia vera: Secondary | ICD-10-CM

## 2015-04-11 DIAGNOSIS — D751 Secondary polycythemia: Secondary | ICD-10-CM | POA: Diagnosis not present

## 2015-04-11 DIAGNOSIS — K5090 Crohn's disease, unspecified, without complications: Secondary | ICD-10-CM | POA: Diagnosis not present

## 2015-04-11 DIAGNOSIS — Z79899 Other long term (current) drug therapy: Secondary | ICD-10-CM | POA: Diagnosis not present

## 2015-04-11 DIAGNOSIS — R61 Generalized hyperhidrosis: Secondary | ICD-10-CM | POA: Diagnosis not present

## 2015-04-11 DIAGNOSIS — Z87891 Personal history of nicotine dependence: Secondary | ICD-10-CM | POA: Diagnosis not present

## 2015-04-11 DIAGNOSIS — R634 Abnormal weight loss: Secondary | ICD-10-CM | POA: Diagnosis not present

## 2015-04-11 LAB — CBC
HCT: 54.7 % — ABNORMAL HIGH (ref 40.0–52.0)
Hemoglobin: 17.6 g/dL (ref 13.0–18.0)
MCH: 25.4 pg — ABNORMAL LOW (ref 26.0–34.0)
MCHC: 32.3 g/dL (ref 32.0–36.0)
MCV: 78.8 fL — ABNORMAL LOW (ref 80.0–100.0)
Platelets: 322 10*3/uL (ref 150–440)
RBC: 6.94 MIL/uL — ABNORMAL HIGH (ref 4.40–5.90)
RDW: 20.3 % — ABNORMAL HIGH (ref 11.5–14.5)
WBC: 15.4 10*3/uL — ABNORMAL HIGH (ref 3.8–10.6)

## 2015-04-11 NOTE — Progress Notes (Signed)
Pt here for 557m phlebotomy. HCT was 54.7 today. Pt tolerated procedure well.

## 2015-04-18 ENCOUNTER — Inpatient Hospital Stay: Payer: Medicare Other | Admitting: Hematology and Oncology

## 2015-04-18 ENCOUNTER — Inpatient Hospital Stay: Payer: Medicare Other

## 2015-04-18 VITALS — BP 124/65 | HR 61 | Temp 96.7°F | Resp 20

## 2015-04-18 DIAGNOSIS — K5090 Crohn's disease, unspecified, without complications: Secondary | ICD-10-CM | POA: Diagnosis not present

## 2015-04-18 DIAGNOSIS — Z87891 Personal history of nicotine dependence: Secondary | ICD-10-CM | POA: Diagnosis not present

## 2015-04-18 DIAGNOSIS — R634 Abnormal weight loss: Secondary | ICD-10-CM | POA: Diagnosis not present

## 2015-04-18 DIAGNOSIS — D45 Polycythemia vera: Secondary | ICD-10-CM

## 2015-04-18 DIAGNOSIS — D751 Secondary polycythemia: Secondary | ICD-10-CM | POA: Diagnosis not present

## 2015-04-18 DIAGNOSIS — Z148 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: Secondary | ICD-10-CM

## 2015-04-18 DIAGNOSIS — Z79899 Other long term (current) drug therapy: Secondary | ICD-10-CM | POA: Diagnosis not present

## 2015-04-18 DIAGNOSIS — R61 Generalized hyperhidrosis: Secondary | ICD-10-CM | POA: Diagnosis not present

## 2015-04-18 LAB — CBC
HCT: 50.9 % (ref 40.0–52.0)
Hemoglobin: 16.3 g/dL (ref 13.0–18.0)
MCH: 24.9 pg — ABNORMAL LOW (ref 26.0–34.0)
MCHC: 31.9 g/dL — ABNORMAL LOW (ref 32.0–36.0)
MCV: 77.9 fL — ABNORMAL LOW (ref 80.0–100.0)
Platelets: 289 10*3/uL (ref 150–440)
RBC: 6.54 MIL/uL — ABNORMAL HIGH (ref 4.40–5.90)
RDW: 19.9 % — ABNORMAL HIGH (ref 11.5–14.5)
WBC: 13.6 10*3/uL — ABNORMAL HIGH (ref 3.8–10.6)

## 2015-04-20 DIAGNOSIS — Z85828 Personal history of other malignant neoplasm of skin: Secondary | ICD-10-CM | POA: Diagnosis not present

## 2015-04-20 DIAGNOSIS — L570 Actinic keratosis: Secondary | ICD-10-CM | POA: Diagnosis not present

## 2015-04-20 DIAGNOSIS — L821 Other seborrheic keratosis: Secondary | ICD-10-CM | POA: Diagnosis not present

## 2015-04-20 DIAGNOSIS — X32XXXA Exposure to sunlight, initial encounter: Secondary | ICD-10-CM | POA: Diagnosis not present

## 2015-04-26 ENCOUNTER — Inpatient Hospital Stay: Payer: Medicare Other

## 2015-04-26 ENCOUNTER — Inpatient Hospital Stay: Payer: Medicare Other | Attending: Hematology and Oncology

## 2015-04-26 DIAGNOSIS — Z803 Family history of malignant neoplasm of breast: Secondary | ICD-10-CM | POA: Insufficient documentation

## 2015-04-26 DIAGNOSIS — K5090 Crohn's disease, unspecified, without complications: Secondary | ICD-10-CM | POA: Diagnosis not present

## 2015-04-26 DIAGNOSIS — Z807 Family history of other malignant neoplasms of lymphoid, hematopoietic and related tissues: Secondary | ICD-10-CM | POA: Diagnosis not present

## 2015-04-26 DIAGNOSIS — Z800 Family history of malignant neoplasm of digestive organs: Secondary | ICD-10-CM | POA: Insufficient documentation

## 2015-04-26 DIAGNOSIS — Z7982 Long term (current) use of aspirin: Secondary | ICD-10-CM | POA: Diagnosis not present

## 2015-04-26 DIAGNOSIS — Z8042 Family history of malignant neoplasm of prostate: Secondary | ICD-10-CM | POA: Diagnosis not present

## 2015-04-26 DIAGNOSIS — D45 Polycythemia vera: Secondary | ICD-10-CM | POA: Diagnosis not present

## 2015-04-26 DIAGNOSIS — Z87891 Personal history of nicotine dependence: Secondary | ICD-10-CM | POA: Diagnosis not present

## 2015-04-26 DIAGNOSIS — Z148 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: Secondary | ICD-10-CM

## 2015-04-26 LAB — CBC
HCT: 49.4 % (ref 40.0–52.0)
Hemoglobin: 15.5 g/dL (ref 13.0–18.0)
MCH: 24.1 pg — ABNORMAL LOW (ref 26.0–34.0)
MCHC: 31.4 g/dL — ABNORMAL LOW (ref 32.0–36.0)
MCV: 76.7 fL — ABNORMAL LOW (ref 80.0–100.0)
Platelets: 265 10*3/uL (ref 150–440)
RBC: 6.44 MIL/uL — ABNORMAL HIGH (ref 4.40–5.90)
RDW: 20.1 % — ABNORMAL HIGH (ref 11.5–14.5)
WBC: 12.6 10*3/uL — ABNORMAL HIGH (ref 3.8–10.6)

## 2015-04-30 DIAGNOSIS — H4011X1 Primary open-angle glaucoma, mild stage: Secondary | ICD-10-CM | POA: Diagnosis not present

## 2015-05-02 ENCOUNTER — Inpatient Hospital Stay: Payer: Medicare Other

## 2015-05-02 ENCOUNTER — Inpatient Hospital Stay: Payer: Medicare Other | Admitting: Hematology and Oncology

## 2015-05-02 VITALS — BP 120/69 | HR 63 | Temp 98.0°F | Resp 20

## 2015-05-02 DIAGNOSIS — Z148 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: Secondary | ICD-10-CM

## 2015-05-02 DIAGNOSIS — Z8042 Family history of malignant neoplasm of prostate: Secondary | ICD-10-CM | POA: Diagnosis not present

## 2015-05-02 DIAGNOSIS — Z87891 Personal history of nicotine dependence: Secondary | ICD-10-CM | POA: Diagnosis not present

## 2015-05-02 DIAGNOSIS — Z7982 Long term (current) use of aspirin: Secondary | ICD-10-CM | POA: Diagnosis not present

## 2015-05-02 DIAGNOSIS — K5090 Crohn's disease, unspecified, without complications: Secondary | ICD-10-CM | POA: Diagnosis not present

## 2015-05-02 DIAGNOSIS — Z800 Family history of malignant neoplasm of digestive organs: Secondary | ICD-10-CM | POA: Diagnosis not present

## 2015-05-02 DIAGNOSIS — D45 Polycythemia vera: Secondary | ICD-10-CM | POA: Diagnosis not present

## 2015-05-02 LAB — CBC WITH DIFFERENTIAL/PLATELET
Basophils Absolute: 0.1 10*3/uL (ref 0–0.1)
Basophils Relative: 1 %
Eosinophils Absolute: 0.1 10*3/uL (ref 0–0.7)
Eosinophils Relative: 1 %
HCT: 47.1 % (ref 40.0–52.0)
Hemoglobin: 14.8 g/dL (ref 13.0–18.0)
Lymphocytes Relative: 9 %
Lymphs Abs: 1.4 10*3/uL (ref 1.0–3.6)
MCH: 23.4 pg — ABNORMAL LOW (ref 26.0–34.0)
MCHC: 31.4 g/dL — ABNORMAL LOW (ref 32.0–36.0)
MCV: 74.5 fL — ABNORMAL LOW (ref 80.0–100.0)
Monocytes Absolute: 1.7 10*3/uL — ABNORMAL HIGH (ref 0.2–1.0)
Monocytes Relative: 11 %
Neutro Abs: 11.6 10*3/uL — ABNORMAL HIGH (ref 1.4–6.5)
Neutrophils Relative %: 78 %
Platelets: 267 10*3/uL (ref 150–440)
RBC: 6.32 MIL/uL — ABNORMAL HIGH (ref 4.40–5.90)
RDW: 19.9 % — ABNORMAL HIGH (ref 11.5–14.5)
WBC: 15 10*3/uL — ABNORMAL HIGH (ref 3.8–10.6)

## 2015-05-02 LAB — FERRITIN: Ferritin: 16 ng/mL — ABNORMAL LOW (ref 24–336)

## 2015-05-02 LAB — IRON AND TIBC
Iron: 26 ug/dL — ABNORMAL LOW (ref 45–182)
Saturation Ratios: 7 % — ABNORMAL LOW (ref 17.9–39.5)
TIBC: 358 ug/dL (ref 250–450)
UIBC: 332 ug/dL

## 2015-05-07 ENCOUNTER — Other Ambulatory Visit: Payer: Self-pay

## 2015-05-07 DIAGNOSIS — D45 Polycythemia vera: Secondary | ICD-10-CM

## 2015-05-09 ENCOUNTER — Inpatient Hospital Stay (HOSPITAL_BASED_OUTPATIENT_CLINIC_OR_DEPARTMENT_OTHER): Payer: Medicare Other | Admitting: Hematology and Oncology

## 2015-05-09 ENCOUNTER — Inpatient Hospital Stay: Payer: Medicare Other

## 2015-05-09 VITALS — BP 114/62 | HR 61 | Temp 95.6°F | Resp 18 | Ht 72.0 in | Wt 176.4 lb

## 2015-05-09 DIAGNOSIS — Z8042 Family history of malignant neoplasm of prostate: Secondary | ICD-10-CM | POA: Diagnosis not present

## 2015-05-09 DIAGNOSIS — Z87891 Personal history of nicotine dependence: Secondary | ICD-10-CM | POA: Diagnosis not present

## 2015-05-09 DIAGNOSIS — Z803 Family history of malignant neoplasm of breast: Secondary | ICD-10-CM

## 2015-05-09 DIAGNOSIS — Z148 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: Secondary | ICD-10-CM

## 2015-05-09 DIAGNOSIS — Z7982 Long term (current) use of aspirin: Secondary | ICD-10-CM

## 2015-05-09 DIAGNOSIS — Z800 Family history of malignant neoplasm of digestive organs: Secondary | ICD-10-CM | POA: Diagnosis not present

## 2015-05-09 DIAGNOSIS — K5090 Crohn's disease, unspecified, without complications: Secondary | ICD-10-CM

## 2015-05-09 DIAGNOSIS — D45 Polycythemia vera: Secondary | ICD-10-CM | POA: Diagnosis not present

## 2015-05-09 DIAGNOSIS — Z807 Family history of other malignant neoplasms of lymphoid, hematopoietic and related tissues: Secondary | ICD-10-CM

## 2015-05-09 LAB — IRON AND TIBC
Iron: 25 ug/dL — ABNORMAL LOW (ref 45–182)
Saturation Ratios: 7 % — ABNORMAL LOW (ref 17.9–39.5)
TIBC: 358 ug/dL (ref 250–450)
UIBC: 333 ug/dL

## 2015-05-09 LAB — CBC WITH DIFFERENTIAL/PLATELET
Band Neutrophils: 0 % (ref 0–10)
Basophils Absolute: 0 10*3/uL (ref 0.0–0.1)
Basophils Relative: 0 % (ref 0–1)
Blasts: 0 %
Eosinophils Absolute: 0.2 10*3/uL (ref 0.0–0.7)
Eosinophils Relative: 1 % (ref 0–5)
HCT: 43.9 % (ref 40.0–52.0)
Hemoglobin: 13.8 g/dL (ref 13.0–18.0)
Lymphocytes Relative: 7 % — ABNORMAL LOW (ref 12–46)
Lymphs Abs: 1.2 10*3/uL (ref 0.7–4.0)
MCH: 23 pg — ABNORMAL LOW (ref 26.0–34.0)
MCHC: 31.5 g/dL — ABNORMAL LOW (ref 32.0–36.0)
MCV: 73.2 fL — ABNORMAL LOW (ref 80.0–100.0)
Metamyelocytes Relative: 0 %
Monocytes Absolute: 2.2 10*3/uL — ABNORMAL HIGH (ref 0.1–1.0)
Monocytes Relative: 13 % — ABNORMAL HIGH (ref 3–12)
Myelocytes: 0 %
Neutro Abs: 13.2 10*3/uL — ABNORMAL HIGH (ref 1.7–7.7)
Neutrophils Relative %: 79 % — ABNORMAL HIGH (ref 43–77)
Platelets: 279 10*3/uL (ref 150–440)
Promyelocytes Absolute: 0 %
RBC: 6 MIL/uL — ABNORMAL HIGH (ref 4.40–5.90)
RDW: 20.6 % — ABNORMAL HIGH (ref 11.5–14.5)
WBC: 16.8 10*3/uL — ABNORMAL HIGH (ref 3.8–10.6)
nRBC: 0 /100 WBC

## 2015-05-09 LAB — FERRITIN: Ferritin: 17 ng/mL — ABNORMAL LOW (ref 24–336)

## 2015-05-09 NOTE — Progress Notes (Signed)
Pt reports no changes since last visit  

## 2015-05-25 DIAGNOSIS — H4011X1 Primary open-angle glaucoma, mild stage: Secondary | ICD-10-CM | POA: Diagnosis not present

## 2015-06-06 ENCOUNTER — Inpatient Hospital Stay: Payer: Medicare Other | Attending: Hematology and Oncology

## 2015-06-06 DIAGNOSIS — D45 Polycythemia vera: Secondary | ICD-10-CM

## 2015-06-06 DIAGNOSIS — D751 Secondary polycythemia: Secondary | ICD-10-CM | POA: Insufficient documentation

## 2015-06-06 LAB — CBC
HCT: 42.8 % (ref 40.0–52.0)
Hemoglobin: 13.1 g/dL (ref 13.0–18.0)
MCH: 20.5 pg — ABNORMAL LOW (ref 26.0–34.0)
MCHC: 30.7 g/dL — ABNORMAL LOW (ref 32.0–36.0)
MCV: 66.7 fL — ABNORMAL LOW (ref 80.0–100.0)
Platelets: 336 10*3/uL (ref 150–440)
RBC: 6.42 MIL/uL — ABNORMAL HIGH (ref 4.40–5.90)
RDW: 22.7 % — ABNORMAL HIGH (ref 11.5–14.5)
WBC: 24.2 10*3/uL — ABNORMAL HIGH (ref 3.8–10.6)

## 2015-07-12 DIAGNOSIS — Z23 Encounter for immunization: Secondary | ICD-10-CM | POA: Diagnosis not present

## 2015-07-31 ENCOUNTER — Encounter: Payer: Self-pay | Admitting: Hematology and Oncology

## 2015-07-31 NOTE — Progress Notes (Signed)
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  HPI: The patient  was last seen in the medical oncology clinic on 04/04/2015 for review of work-up.  Evalution confirmed polycythemia rubra vera.  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  Erythropoietin level was 1.1 (low).  Carboxyhemoglobin was slightly elevated at 2.6% (< 2%).  He began a weekly phlebotomy program to decrease his hematocrit below 45.   He has had 4 phlebotomies to date (last 05/02/2015).    Symptomatically, he is doing well. He continues to work for [employer] 1-2 days a week. He is avoiding dehydration by drinking lots of fluid.  He is taking a baby aspirin a day.  Past Medical History  Diagnosis Date  . Glaucoma   . Crohn's disease ([place])     No past surgical history on file.  Family History  Problem Relation Age of Onset  . Cancer Mother     Liver  . Cancer Father     Prostate  . Cancer Sister     Hodgkins lymphoma  . Cancer Paternal Aunt     Breast    Social History:  reports that he quit smoking about 11 years ago. His smoking use included Cigarettes. He has a 5 pack-year smoking history. He does not have any smokeless tobacco history on file. He reports that he drinks about 1.2 oz of alcohol per week. He reports that he does not use illicit drugs.  He previously worked for [employer].  He volunteers for [employer].  The patient is accompanied by his wife, [name], today.  Allergies: No Known Allergies  Current Medications: Current Outpatient Prescriptions  Medication Sig Dispense Refill  . aspirin 81 MG tablet Take 81 mg by mouth daily.    . Cholecalciferol (VITAMIN D3) 1000 UNITS CAPS Take by mouth.    . Cyanocobalamin (RA VITAMIN B-12 TR) 1000 MCG TBCR Place one tablet under tongue once a  day as needed.    . folic acid (FOLVITE) 1 MG tablet TAKE 1 TABLET (1,000 MCG TOTAL) BY MOUTH ONCE DAILY. MUST KEEP APPT FOR FURTHER REFILLS.    Marland Kitchen latanoprost (XALATAN) 0.005 % ophthalmic solution Apply to eye.    . Multiple Vitamins-Minerals (CENTRUM SILVER ULTRA MENS) TABS Take by mouth.    . Omega-3 Fatty Acids (KP FISH OIL) 1200 MG CAPS Take by mouth.     No current facility-administered medications for this visit.    Review of Systems:  GENERAL:  Feels good.  Active.  No fevers or sweats.  Weight stable. PERFORMANCE STATUS (ECOG):  0 HEENT:  Runny nose secondary to pollen.  No visual changes, sore throat, mouth sores or tenderness. Lungs: No shortness of breath or cough.  No hemoptysis. Cardiac:  No chest pain, palpitations, orthopnea, or PND. GI:  Diarrhea on occasion.  No nausea, vomiting, constipation, melena or hematochezia. GU:  No urgency, frequency, dysuria, or hematuria. Musculoskeletal:  No back pain.  Joints stiff at times.  No muscle tenderness. Extremities:  No pain or swelling. Skin:  No rashes or skin changes. Neuro:  Occasional tingle in toes.  No headache, numbness or weakness, balance or coordination issues. Endocrine:  No diabetes, thyroid issues, hot flashes or night sweats. Psych:  No mood changes, depression or anxiety. Pain:  No focal pain. Review  of systems:  All other systems reviewed and found to be negative.   Physical Exam: Blood pressure 114/62, pulse 61, temperature 95.6 F (35.3 C), temperature source Tympanic, resp. rate 18, height 6' (1.829 m), weight 176 lb 5.9 oz (80 kg), SpO2 99 %. GENERAL:  Well developed, well nourished, sitting comfortably in the exam room in no acute distress. MENTAL STATUS:  Alert and oriented to person, place and time. HEAD:  White hair.  Normocephalic, atraumatic, face symmetric, no Cushingoid features. EYES: Glasses. Blue eyes. Pupils equal round and reactive to light and accomodation. No conjunctivitis or scleral  icterus. ENT: Oropharynx clear without lesion. Tongue normal. Mucous membranes moist.  RESPIRATORY: Clear to auscultation without rales, wheezes or rhonchi. CARDIOVASCULAR: Regular rate and rhythm without murmur, rub or gallop. ABDOMEN: Soft, non-tender, with active bowel sounds, and no hepatosplenomegaly. No masses. SKIN: No rashes, ulcers or lesions. EXTREMITIES: No edema, no skin discoloration or tenderness. No palpable cords. LYMPH NODES: No palpable cervical, supraclavicular, axillary or inguinal adenopathy  NEUROLOGICAL: Unremarkable. PSYCH: Appropriate.   Appointment on 05/09/2015  Component Date Value Ref Range Status  . WBC 05/09/2015 16.8* 3.8 - 10.6 K/uL Final  . RBC 05/09/2015 6.00* 4.40 - 5.90 MIL/uL Final  . Hemoglobin 05/09/2015 13.8  13.0 - 18.0 g/dL Final  . HCT 05/09/2015 43.9  40.0 - 52.0 % Final  . MCV 05/09/2015 73.2* 80.0 - 100.0 fL Final  . MCH 05/09/2015 23.0* 26.0 - 34.0 pg Final  . MCHC 05/09/2015 31.5* 32.0 - 36.0 g/dL Final  . RDW 05/09/2015 20.6* 11.5 - 14.5 % Final  . Platelets 05/09/2015 279  150 - 440 K/uL Final  . Neutrophils Relative % 05/09/2015 79* 43 - 77 % Final  . Lymphocytes Relative 05/09/2015 7* 12 - 46 % Final  . Monocytes Relative 05/09/2015 13* 3 - 12 % Final  . Eosinophils Relative 05/09/2015 1  0 - 5 % Final  . Basophils Relative 05/09/2015 0  0 - 1 % Final  . Band Neutrophils 05/09/2015 0  0 - 10 % Final  . Metamyelocytes Relative 05/09/2015 0   Final  . Myelocytes 05/09/2015 0   Final  . Promyelocytes Absolute 05/09/2015 0   Final  . Blasts 05/09/2015 0   Final  . nRBC 05/09/2015 0  0 /100 WBC Final  . Neutro Abs 05/09/2015 13.2* 1.7 - 7.7 K/uL Final  . Lymphs Abs 05/09/2015 1.2  0.7 - 4.0 K/uL Final  . Monocytes Absolute 05/09/2015 2.2* 0.1 - 1.0 K/uL Final  . Eosinophils Absolute 05/09/2015 0.2  0.0 - 0.7 K/uL Final  . Basophils Absolute 05/09/2015 0.0  0.0 - 0.1 K/uL Final  . RBC Morphology 05/09/2015 MIXED RBC  POPULATION   Final   STOMATOCYTES  . Smear Review 05/09/2015 LARGE PLATELETS PRESENT   Final   Comment: GIANT PLATELETS SEEN RESULT REPEATED AND VERIFIED   . Ferritin 05/09/2015 17* 24 - 336 ng/mL Final  . Iron 05/09/2015 25* 45 - 182 ug/dL Final  . TIBC 05/09/2015 358  250 - 450 ug/dL Final  . Saturation Ratios 05/09/2015 7* 17.9 - 39.5 % Final  . 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  He has had polycythemia dating back to 2013.  Hematocrit was 62.1 with a hemoglobin of 19.8 on 03/28/2015.  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  Erythropoietin level was 1.1 (low).  He is a Administrator, sports  carrier (H63D).  He began a phlebotomy program on 03/28/2015 to maintain a hematocrit goal of < 45.  He last underwent phlebotomy on 05/02/2015.  He is on a baby aspirin.  Symptomatically, he feels well.  Exam is unremarkable.  He is iron deficient (ferritin 17).  Plan: 1. Labs today:  CBC with diff, ferritin, iron studies. 2. No phlebotomy today (hematocrit 43.9). 3. Continue aspirin 81 mg a day. 4. RTC in 1 month for CBC with diff. 5. RTC in 3 months for MD assess, labs (CBC with diff, ferritin) and +/- phlebotomy.   Lequita Asal, MD  05/09/2015

## 2015-08-01 ENCOUNTER — Inpatient Hospital Stay: Payer: Medicare Other | Attending: Hematology and Oncology

## 2015-08-01 ENCOUNTER — Inpatient Hospital Stay (HOSPITAL_BASED_OUTPATIENT_CLINIC_OR_DEPARTMENT_OTHER): Payer: Medicare Other | Admitting: Hematology and Oncology

## 2015-08-01 ENCOUNTER — Other Ambulatory Visit: Payer: Self-pay

## 2015-08-01 VITALS — BP 118/65 | HR 67 | Temp 97.7°F | Resp 18 | Ht 72.0 in | Wt 180.1 lb

## 2015-08-01 DIAGNOSIS — K5090 Crohn's disease, unspecified, without complications: Secondary | ICD-10-CM

## 2015-08-01 DIAGNOSIS — Z7982 Long term (current) use of aspirin: Secondary | ICD-10-CM | POA: Diagnosis not present

## 2015-08-01 DIAGNOSIS — Z86010 Personal history of colonic polyps: Secondary | ICD-10-CM | POA: Insufficient documentation

## 2015-08-01 DIAGNOSIS — D45 Polycythemia vera: Secondary | ICD-10-CM

## 2015-08-01 DIAGNOSIS — Z79899 Other long term (current) drug therapy: Secondary | ICD-10-CM

## 2015-08-01 DIAGNOSIS — Z87891 Personal history of nicotine dependence: Secondary | ICD-10-CM

## 2015-08-01 LAB — CBC WITH DIFFERENTIAL/PLATELET
Basophils Absolute: 0.3 10*3/uL — ABNORMAL HIGH (ref 0–0.1)
Basophils Relative: 1 %
Eosinophils Absolute: 0.3 10*3/uL (ref 0–0.7)
Eosinophils Relative: 1 %
HCT: 39.9 % — ABNORMAL LOW (ref 40.0–52.0)
Hemoglobin: 12 g/dL — ABNORMAL LOW (ref 13.0–18.0)
Lymphocytes Relative: 6 %
Lymphs Abs: 1.6 10*3/uL (ref 1.0–3.6)
MCH: 17.7 pg — ABNORMAL LOW (ref 26.0–34.0)
MCHC: 30 g/dL — ABNORMAL LOW (ref 32.0–36.0)
MCV: 59 fL — ABNORMAL LOW (ref 80.0–100.0)
Monocytes Absolute: 3.9 10*3/uL — ABNORMAL HIGH (ref 0.2–1.0)
Monocytes Relative: 15 %
Neutro Abs: 19.8 10*3/uL — ABNORMAL HIGH (ref 1.4–6.5)
Neutrophils Relative %: 77 %
Platelets: 236 10*3/uL (ref 150–440)
RBC: 6.77 MIL/uL — ABNORMAL HIGH (ref 4.40–5.90)
RDW: 22.5 % — ABNORMAL HIGH (ref 11.5–14.5)
WBC: 25.9 10*3/uL — ABNORMAL HIGH (ref 3.8–10.6)

## 2015-08-01 LAB — FERRITIN: Ferritin: 21 ng/mL — ABNORMAL LOW (ref 24–336)

## 2015-08-01 NOTE — Progress Notes (Signed)
[facility] day:  08/01/2015   Chief Complaint: Ruben [name] is a 69 y.o. 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  HPI: The patient  was last seen in the medical oncology clinic on 05/09/2015 for 1 month assessment after instituting a phlebotomy program.  He had 4 undergone phlebotomies (last 05/02/2015).  CBC included a hematocrit of 43.9, hemoglobin 13.8, MCV 73.2, platelets 279,000, white count 16,800 with an ANC of 13,200.  Hematocrit was less than 45, he did not undergo phlebotomy.   On 06/06/2015, hemoglobin was 42.8, hemoglobin 13.1, MCV 66.7, platelets 336,000 and white count 24,200.  During the interim, he has felt fine. He continues to take a baby aspirin a day. Regarding his diet he describes eating oatmeal, a salad or sandwich for lunch and regular dinner. He does not eat red meat. He is eating chicken and some fish. He states that he craves pretzels and ice cream.  He underwent colonoscopy about 3-4 years ago with Dr. Vira Agar. He sees him every 5 years for a history of polyps and Crohn's disease. He has been off oral iron since 04/25/2015.  Past Medical History  Diagnosis Date  . Glaucoma   . Crohn's disease ([place])     No past surgical history on file.  Family History  Problem Relation Age of Onset  . Cancer Mother     Liver  . Cancer Father     Prostate  . Cancer Sister     Hodgkins lymphoma  . Cancer Paternal Aunt     Breast    Social History:  reports that he quit smoking about 11 years ago. His smoking use included Cigarettes. He has a 5 pack-year smoking history. He does not have any smokeless tobacco history on file. He reports that he drinks about 1.2 oz of alcohol per week. He reports that he does not use illicit drugs.  He previously worked for [employer].  He volunteers for [employer].  The patient is accompanied by his  wife, [name], today.  Allergies: No Known Allergies  Current Medications: Current Outpatient Prescriptions  Medication Sig Dispense Refill  . aspirin 81 MG tablet Take 81 mg by mouth daily.    . Cholecalciferol (VITAMIN D3) 1000 UNITS CAPS Take by mouth.    . Cyanocobalamin (RA VITAMIN B-12 TR) 1000 MCG TBCR Place one tablet under tongue once a day as needed.    . folic acid (FOLVITE) 1 MG tablet TAKE 1 TABLET (1,000 MCG TOTAL) BY MOUTH ONCE DAILY. MUST KEEP APPT FOR FURTHER REFILLS.    Marland Kitchen latanoprost (XALATAN) 0.005 % ophthalmic solution Apply to eye.    . Multiple Vitamins-Minerals (CENTRUM SILVER ULTRA MENS) TABS Take by mouth.    . Omega-3 Fatty Acids (KP FISH OIL) 1200 MG CAPS Take by mouth.     No current facility-administered medications for this visit.    Review of Systems:  GENERAL:  Feels fine.  Active.  No fevers or sweats.  Weight stable. PERFORMANCE STATUS (ECOG):  0 HEENT:  No visual changes, runny nose, sore throat, mouth sores or tenderness. Lungs: No shortness of breath or cough.  No hemoptysis. Cardiac:  No chest pain, palpitations, orthopnea, or PND. GI:  Diarrhea on occasion.  He has 2 bowel movements a day.  No nausea, vomiting, constipation, melena or hematochezia.  Craves pretzels and ice cream. GU:  No urgency, frequency, dysuria,  or hematuria. Musculoskeletal:  No back pain.  Joints stiff at times.  No muscle tenderness. Extremities:  No pain or swelling. Skin:  No rashes or skin changes. Neuro:  Occasional tingle in toes.  No headache, numbness or weakness, balance or coordination issues. Endocrine:  No diabetes, thyroid issues, hot flashes or night sweats. Psych:  No mood changes, depression or anxiety. Pain:  No focal pain. Review of systems:  All other systems reviewed and found to be negative.   Physical Exam: Blood pressure 118/65, pulse 67, temperature 97.7 F (36.5 C), temperature source Tympanic, resp. rate 18, height 6' (1.829 m), weight 180 lb  1.9 oz (81.7 kg). GENERAL:  Well developed, well nourished, sitting comfortably in the exam room in no acute distress. MENTAL STATUS:  Alert and oriented to person, place and time. HEAD:  Pearline Cables hair.  Normocephalic, atraumatic, face symmetric, no Cushingoid features. EYES: Glasses. Blue eyes. Pupils equal round and reactive to light and accomodation. No conjunctivitis or scleral icterus. ENT: Oropharynx clear without lesion. Tongue normal. Mucous membranes moist.  RESPIRATORY: Clear to auscultation without rales, wheezes or rhonchi. CARDIOVASCULAR: Regular rate and rhythm without murmur, rub or gallop. ABDOMEN: Soft, non-tender, with active bowel sounds, and no hepatosplenomegaly. No masses. SKIN: No rashes, ulcers or lesions. EXTREMITIES: No edema, no skin discoloration or tenderness. No palpable cords. LYMPH NODES: No palpable cervical, supraclavicular, axillary or inguinal adenopathy  NEUROLOGICAL: Unremarkable. PSYCH: Appropriate.   Appointment on 08/01/2015  Component Date Value Ref Range Status  . WBC 08/01/2015 25.9* 3.8 - 10.6 K/uL Final  . RBC 08/01/2015 6.77* 4.40 - 5.90 MIL/uL Final  . Hemoglobin 08/01/2015 12.0* 13.0 - 18.0 g/dL Final  . HCT 08/01/2015 39.9* 40.0 - 52.0 % Final  . MCV 08/01/2015 59.0* 80.0 - 100.0 fL Final  . MCH 08/01/2015 17.7* 26.0 - 34.0 pg Final  . MCHC 08/01/2015 30.0* 32.0 - 36.0 g/dL Final   RESULT REPEATED AND VERIFIED  . RDW 08/01/2015 22.5* 11.5 - 14.5 % Final  . Platelets 08/01/2015 236  150 - 440 K/uL Final  . Neutrophils Relative % 08/01/2015 77   Final  . Lymphocytes Relative 08/01/2015 6   Final  . Monocytes Relative 08/01/2015 15   Final  . Eosinophils Relative 08/01/2015 1   Final  . Basophils Relative 08/01/2015 1   Final  . Neutro Abs 08/01/2015 19.8* 1.4 - 6.5 K/uL Final  . Lymphs Abs 08/01/2015 1.6  1.0 - 3.6 K/uL Final  . Monocytes Absolute 08/01/2015 3.9* 0.2 - 1.0 K/uL Final  . Eosinophils Absolute 08/01/2015 0.3  0  - 0.7 K/uL Final  . Basophils Absolute 08/01/2015 0.3* 0 - 0.1 K/uL Final    Assessment:  Ruben [name] is a 69 y.o. 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  He has had polycythemia dating back to 2013.  Hematocrit was 62.1 with a hemoglobin of 19.8 on 03/28/2015.  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  Erythropoietin level was 1.1 (low).  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  He began a phlebotomy program on 03/28/2015 to maintain a hematocrit goal of < 45.  He last underwent phlebotomy on 05/02/2015.  He is on a baby aspirin.  He is followed closely by GI (Dr. Vira Agar) for a history of polyps and Crohn's disease.  Last colonoscopy was 3-4 years ago.  He has become iron deficient on his phlebotomy program (ferritin 17 today).  He has pica (pretzels and ice cream).  Symptomatically, he feels fine.  Exam  is unremarkable.   Plan: 1. Labs today:  CBC with diff, ferritin, iron studies. 2. No phlebotomy today (hematocrit 39.9). 3. Continue aspirin 81 mg a day. 4. Guaiac cards x 3. 5. RTC in 1 month for labs (CBC with diff) +/- phlebtotomy. 6. RTC in 3 months for MD assess, labs (CBC with diff, ferritin) and +/- phlebotomy.   Lequita Asal, MD  05/09/2015

## 2015-08-01 NOTE — Progress Notes (Signed)
Follow-up of polycythemia vera. States he has been doing well.

## 2015-08-03 DIAGNOSIS — D45 Polycythemia vera: Secondary | ICD-10-CM | POA: Diagnosis not present

## 2015-08-03 DIAGNOSIS — Z7982 Long term (current) use of aspirin: Secondary | ICD-10-CM | POA: Diagnosis not present

## 2015-08-03 DIAGNOSIS — Z86010 Personal history of colonic polyps: Secondary | ICD-10-CM | POA: Diagnosis not present

## 2015-08-03 DIAGNOSIS — Z87891 Personal history of nicotine dependence: Secondary | ICD-10-CM | POA: Diagnosis not present

## 2015-08-03 DIAGNOSIS — K5090 Crohn's disease, unspecified, without complications: Secondary | ICD-10-CM | POA: Diagnosis not present

## 2015-08-03 DIAGNOSIS — Z79899 Other long term (current) drug therapy: Secondary | ICD-10-CM | POA: Diagnosis not present

## 2015-08-04 DIAGNOSIS — Z87891 Personal history of nicotine dependence: Secondary | ICD-10-CM | POA: Diagnosis not present

## 2015-08-04 DIAGNOSIS — Z7982 Long term (current) use of aspirin: Secondary | ICD-10-CM | POA: Diagnosis not present

## 2015-08-04 DIAGNOSIS — Z86010 Personal history of colonic polyps: Secondary | ICD-10-CM | POA: Diagnosis not present

## 2015-08-04 DIAGNOSIS — D45 Polycythemia vera: Secondary | ICD-10-CM | POA: Diagnosis not present

## 2015-08-04 DIAGNOSIS — Z79899 Other long term (current) drug therapy: Secondary | ICD-10-CM | POA: Diagnosis not present

## 2015-08-04 DIAGNOSIS — K5090 Crohn's disease, unspecified, without complications: Secondary | ICD-10-CM | POA: Diagnosis not present

## 2015-08-05 DIAGNOSIS — D45 Polycythemia vera: Secondary | ICD-10-CM | POA: Diagnosis not present

## 2015-08-05 DIAGNOSIS — K5090 Crohn's disease, unspecified, without complications: Secondary | ICD-10-CM | POA: Diagnosis not present

## 2015-08-05 DIAGNOSIS — Z7982 Long term (current) use of aspirin: Secondary | ICD-10-CM | POA: Diagnosis not present

## 2015-08-05 DIAGNOSIS — Z79899 Other long term (current) drug therapy: Secondary | ICD-10-CM | POA: Diagnosis not present

## 2015-08-05 DIAGNOSIS — Z86010 Personal history of colonic polyps: Secondary | ICD-10-CM | POA: Diagnosis not present

## 2015-08-05 DIAGNOSIS — Z87891 Personal history of nicotine dependence: Secondary | ICD-10-CM | POA: Diagnosis not present

## 2015-08-06 ENCOUNTER — Other Ambulatory Visit: Payer: Self-pay | Admitting: *Deleted

## 2015-08-06 ENCOUNTER — Ambulatory Visit: Payer: Medicare Other

## 2015-08-06 DIAGNOSIS — D751 Secondary polycythemia: Secondary | ICD-10-CM

## 2015-08-06 LAB — OCCULT BLOOD X 1 CARD TO LAB, STOOL
Fecal Occult Bld: NEGATIVE
Fecal Occult Bld: NEGATIVE
Fecal Occult Bld: NEGATIVE

## 2015-08-08 ENCOUNTER — Other Ambulatory Visit: Payer: Medicare Other

## 2015-08-08 ENCOUNTER — Ambulatory Visit: Payer: Medicare Other | Admitting: Hematology and Oncology

## 2015-08-13 ENCOUNTER — Inpatient Hospital Stay: Payer: Medicare Other

## 2015-08-15 ENCOUNTER — Other Ambulatory Visit: Payer: Medicare Other

## 2015-08-15 ENCOUNTER — Ambulatory Visit: Payer: Medicare Other | Admitting: Hematology and Oncology

## 2015-08-23 ENCOUNTER — Telehealth: Payer: Self-pay | Admitting: Hematology and Oncology

## 2015-08-23 DIAGNOSIS — L282 Other prurigo: Secondary | ICD-10-CM | POA: Insufficient documentation

## 2015-08-23 DIAGNOSIS — M549 Dorsalgia, unspecified: Secondary | ICD-10-CM | POA: Insufficient documentation

## 2015-08-23 DIAGNOSIS — N400 Enlarged prostate without lower urinary tract symptoms: Secondary | ICD-10-CM | POA: Insufficient documentation

## 2015-08-23 DIAGNOSIS — L309 Dermatitis, unspecified: Secondary | ICD-10-CM | POA: Insufficient documentation

## 2015-08-23 DIAGNOSIS — K5090 Crohn's disease, unspecified, without complications: Secondary | ICD-10-CM | POA: Insufficient documentation

## 2015-08-23 DIAGNOSIS — R972 Elevated prostate specific antigen [PSA]: Secondary | ICD-10-CM | POA: Insufficient documentation

## 2015-08-23 DIAGNOSIS — H409 Unspecified glaucoma: Secondary | ICD-10-CM | POA: Insufficient documentation

## 2015-08-23 DIAGNOSIS — Z87891 Personal history of nicotine dependence: Secondary | ICD-10-CM | POA: Insufficient documentation

## 2015-08-23 NOTE — Telephone Encounter (Signed)
She came to my desk today and said his diarhea is even worse than it was before. She said he looks very skeletal and weary. She said he can't really go anywhere because he is always in the bathroom. She would really like to know if they can change his appt this week (08/29/15) to lab/md/phleb. Instead of just lab/phleb. She also said he is seeing Dr. Caryn Section on 12/5 and she wants to make sure whatever he is doing there will not adversely affect his labs for [place] on the 7th. Please advise. Can we add him as an MD visit that day? Also, she told him that she was going to come by to talk to you about this so it is no longer "a secret." Thanks.

## 2015-08-23 NOTE — Telephone Encounter (Signed)
  Please call patient.  M

## 2015-08-24 DIAGNOSIS — K5010 Crohn's disease of large intestine without complications: Secondary | ICD-10-CM | POA: Diagnosis not present

## 2015-08-24 DIAGNOSIS — R197 Diarrhea, unspecified: Secondary | ICD-10-CM | POA: Diagnosis not present

## 2015-08-27 ENCOUNTER — Ambulatory Visit (INDEPENDENT_AMBULATORY_CARE_PROVIDER_SITE_OTHER): Payer: Medicare Other | Admitting: Family Medicine

## 2015-08-27 ENCOUNTER — Encounter: Payer: Self-pay | Admitting: Family Medicine

## 2015-08-27 VITALS — BP 128/72 | HR 72 | Temp 97.8°F | Resp 16 | Ht 72.0 in | Wt 174.0 lb

## 2015-08-27 DIAGNOSIS — Z0000 Encounter for general adult medical examination without abnormal findings: Secondary | ICD-10-CM | POA: Diagnosis not present

## 2015-08-27 DIAGNOSIS — I229 Subsequent ST elevation (STEMI) myocardial infarction of unspecified site: Secondary | ICD-10-CM

## 2015-08-27 DIAGNOSIS — R319 Hematuria, unspecified: Secondary | ICD-10-CM

## 2015-08-27 DIAGNOSIS — I214 Non-ST elevation (NSTEMI) myocardial infarction: Secondary | ICD-10-CM

## 2015-08-27 DIAGNOSIS — Z125 Encounter for screening for malignant neoplasm of prostate: Secondary | ICD-10-CM | POA: Diagnosis not present

## 2015-08-27 LAB — POCT URINALYSIS DIPSTICK
BILIRUBIN UA: NEGATIVE
GLUCOSE UA: NEGATIVE
KETONES UA: NEGATIVE
Nitrite, UA: NEGATIVE
SPEC GRAV UA: 1.025
Urobilinogen, UA: 0.2
pH, UA: 6.5

## 2015-08-27 NOTE — Telephone Encounter (Signed)
Unfortunately, we only see pt for polycythemia ruba vera. Please have pt follow up with PCP per MD

## 2015-08-27 NOTE — Progress Notes (Addendum)
Patient ID: Ruben [name], male   DOB: 11-09-1945, 69 y.o.   MRN: [id number]       Patient: Ruben [name], Male    DOB: [birth year], 69 y.o.   MRN: [id number] Visit Date: 08/27/2015  Today's Provider: Lelon Huh, MD   Chief Complaint  Patient presents with  . Annual Exam   Subjective:    Annual wellness visit Ruben [name] is a 69 y.o. male. He feels well. He reports exercising daily.Marland Kitchen He reports he is sleeping well. He reports that he sleeps on average 6-8 hours a night.   Last: Colonoscopy- 08/08/2011  EKG- 05/03/2012  Pneumovax- 05/03/2012  PVC13- 04/10/2014  Td-02/11/2002  Zoster- 08/10/2014  PSA- 04/10/2014 (2.1)  -----------------------------------------------------------  Hematuria He states he starting seeing blood in urine over the last 4-5 days an is urinating more frequently. Slight burning with urination. No fevers or chills. No flank pain.   He is being followed by Dr. Mike Gip for Polycythemia and having occasional phlebotomies.   Review of Systems  Constitutional: Negative.   HENT: Negative.   Eyes: Negative.   Respiratory: Negative.   Cardiovascular: Negative.   Gastrointestinal: Positive for diarrhea.  Endocrine: Negative.   Genitourinary: Positive for frequency and hematuria.  Musculoskeletal: Negative.   Skin: Negative.   Allergic/Immunologic: Negative.   Neurological: Negative.   Hematological: Negative.   Psychiatric/Behavioral: Negative.     Social History   Social History  . Marital Status: Married    Spouse Name: N/A  . Number of Children: 2  . Years of Education: N/A   Occupational History  . Retired    Social History Main Topics  . Smoking status: Former Smoker -- 0.25 packs/day for 20 years    Types: Cigarettes    Quit date: 09/28/2003  . Smokeless tobacco: Not on file  . Alcohol Use: 1.2 oz/week    2 Glasses of wine per week     Comment: 1 glass of red wine daily  . Drug Use: No  . Sexual Activity:  Not on file   Other Topics Concern  . Not on file   Social History Narrative    Patient Active Problem List   Diagnosis Date Noted  . Back pain with radiation 08/23/2015  . BPH (benign prostatic hyperplasia) 08/23/2015  . History of tobacco use 08/23/2015  . Glaucoma 08/23/2015  . Enlarged prostate 08/23/2015  . Elevated prostate specific antigen (PSA) 08/23/2015  . Dermatitis 08/23/2015  . Crohn disease ([place]) 08/23/2015  . Papular urticaria 08/23/2015  . Polycythemia rubra vera ([place]) 04/04/2015  . Erythrocytosis 03/28/2015  . 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 CC (Crohn's colitis) ([place]) 02/25/2015  . Bundle branch block, right 05/03/2012    Past Surgical History  Procedure Laterality Date  . Skin lesion basal cell removed    . Wart removal      from eyelid  . Prostate surgery  2008    Prostate Biopsy in [place] to elevated PSA.  per patient normal    His family history includes Cancer in his father, mother, paternal aunt, and sister.    Previous Medications   ASPIRIN 81 MG TABLET    Take 81 mg by mouth daily.   CHOLECALCIFEROL (VITAMIN D3) 1000 UNITS CAPS    Take by mouth.   CYANOCOBALAMIN (RA VITAMIN B-12 TR) 1000 MCG TBCR    Place one tablet under tongue once a day as needed.   FOLIC ACID (FOLVITE) 1 MG TABLET    TAKE 1 TABLET (1,000 MCG  TOTAL) BY MOUTH ONCE DAILY. MUST KEEP APPT FOR FURTHER REFILLS.   LATANOPROST (XALATAN) 0.005 % OPHTHALMIC SOLUTION    Apply to eye.   MULTIPLE VITAMINS-MINERALS (CENTRUM SILVER ULTRA MENS) TABS    Take by mouth.   OMEGA-3 FATTY ACIDS (KP FISH OIL) 1200 MG CAPS    Take by mouth.    Patient Care Team: [name], MD as PCP - General (Family Medicine) [name], MD as Referring Physician (Family Medicine) [name], MD (Gastroenterology) Estill Cotta, MD (Ophthalmology)     Objective:   Vitals: BP 128/72 mmHg  Pulse 72  Temp(Src) 97.8 F (36.6 C)  Resp 16  Ht 6' (1.829 m)  Wt 174 lb (78.926  kg)  BMI 23.59 kg/m2  SpO2 98%  Physical Exam   General Appearance:    Alert, cooperative, no distress, appears stated age  Head:    Normocephalic, without obvious abnormality, atraumatic  Eyes:    PERRL, conjunctiva/corneas clear, EOM's intact, fundi    benign, both eyes       Ears:    Normal TM's and external ear canals, both ears  Nose:   Nares normal, septum midline, mucosa normal, no drainage   or sinus tenderness  Throat:   Lips, mucosa, and tongue normal; teeth and gums normal  Neck:   Supple, symmetrical, trachea midline, no adenopathy;       thyroid:  No enlargement/tenderness/nodules; no carotid   bruit or JVD  Back:     Symmetric, no curvature, ROM normal, no CVA tenderness  Lungs:     Clear to auscultation bilaterally, respirations unlabored  Chest wall:    No tenderness or deformity  Heart:    Regular rate and rhythm, S1 and S2 normal, no murmur, rub   or gallop  Abdomen:     Soft, non-tender, bowel sounds active all four quadrants,    no masses, no organomegaly  Genitalia:    deferred  Rectal:    deferred  Extremities:   Extremities normal, atraumatic, no cyanosis or edema  Pulses:   2+ and symmetric all extremities  Skin:   Skin color, texture, turgor normal, no rashes or lesions  Lymph nodes:   Cervical, supraclavicular, and axillary nodes normal  Neurologic:   CNII-XII intact. Normal strength, sensation and reflexes      throughout    Activities of Daily Living In your present state of health, do you have any difficulty performing the following activities: 08/27/2015  Hearing? N  Vision? N  Difficulty concentrating or making decisions? N  Walking or climbing stairs? N  Dressing or bathing? N  Doing errands, shopping? N    Fall Risk Assessment Fall Risk  08/27/2015  Falls in the past year? No     Depression Screen PHQ 2/9 Scores 08/27/2015  PHQ - 2 Score 0    Cognitive Testing - 6-CIT  Correct? Score   What year is it? yes 0 0 or 4  What month is  it? yes 0 0 or 3  Memorize:    Pia Mau,  43,  [place],      What time is it? (within 1 hour) yes 0 0 or 3  Count backwards from 20 yes 0 0, 2, or 4  Name the months of the year yes 0 0, 2, or 4  Repeat name & address above yes 0 0, 2, 4, 6, 8, or 10       TOTAL SCORE  0/28  Interpretation:  Normal  Normal (0-7) Abnormal (8-28)    Results for orders placed or performed in visit on 08/27/15  PSA  Result Value Ref Range   Prostate Specific Ag, Serum 2.0 0.0 - 4.0 ng/mL  POCT Urinalysis Dipstick  Result Value Ref Range   Color, UA brown    Clarity, UA cloudy    Glucose, UA negative    Bilirubin, UA negative    Ketones, UA negative    Spec Grav, UA 1.025    Blood, UA hemolyzed large    pH, UA 6.5    Protein, UA 300(+++)    Urobilinogen, UA 0.2    Nitrite, UA negative    Leukocytes, UA small (1+) (A) Negative   RBC. TMTC WBC 5-10/hgf Many uric acid crystals.   Assessment & Plan:     Annual Wellness Visit  Reviewed patient's Family Medical History Reviewed and updated list of patient's medical providers Assessment of cognitive impairment was done Assessed patient's functional ability Established a written schedule for health screening [place] Completed and Reviewed  Exercise Activities and Dietary recommendations Goals    None      Immunization History  Administered Date(s) Administered  . Pneumococcal Conjugate-13 04/10/2014  . Pneumococcal Polysaccharide-23 05/03/2012  . Td 02/11/2002  . Zoster 07/10/2014    Health Maintenance  Topic Date Due  . Samul Dada  02/12/2012  . INFLUENZA VACCINE  12/21/2015 (Originally 04/23/2015)  . COLONOSCOPY  08/07/2021  . ZOSTAVAX  Completed  . Hepatitis C Screening  Completed  . PNA vac Low Risk Adult  Completed      Discussed health benefits of physical activity, and encouraged him to engage in regular exercise appropriate for his age and condition.      ------------------------------------------------------------------------------------------------------------  1. Subsequent Medicare Annual Wellness Visit  2. Hematuria If cultures negative will recheck u/a in about a week.  - POCT Urinalysis Dipstick - Urine Culture  3. Prostate cancer screening  - PSA

## 2015-08-28 LAB — PSA: Prostate Specific Ag, Serum: 2 ng/mL (ref 0.0–4.0)

## 2015-08-28 LAB — URINE CULTURE: ORGANISM ID, BACTERIA: NO GROWTH

## 2015-08-29 ENCOUNTER — Telehealth: Payer: Self-pay | Admitting: Family Medicine

## 2015-08-29 ENCOUNTER — Inpatient Hospital Stay: Payer: Medicare Other | Attending: Hematology and Oncology

## 2015-08-29 DIAGNOSIS — D45 Polycythemia vera: Secondary | ICD-10-CM | POA: Insufficient documentation

## 2015-08-29 LAB — CBC WITH DIFFERENTIAL/PLATELET
Band Neutrophils: 0 %
Basophils Absolute: 0 10*3/uL (ref 0–0.1)
Basophils Relative: 0 %
Blasts: 0 %
Eosinophils Absolute: 0.7 10*3/uL (ref 0–0.7)
Eosinophils Relative: 3 %
HCT: 43.9 % (ref 40.0–52.0)
Hemoglobin: 13.1 g/dL (ref 13.0–18.0)
Lymphocytes Relative: 10 %
Lymphs Abs: 2.5 10*3/uL (ref 1.0–3.6)
MCH: 18.2 pg — ABNORMAL LOW (ref 26.0–34.0)
MCHC: 29.8 g/dL — ABNORMAL LOW (ref 32.0–36.0)
MCV: 61.3 fL — ABNORMAL LOW (ref 80.0–100.0)
Metamyelocytes Relative: 0 %
Monocytes Absolute: 4.7 10*3/uL — ABNORMAL HIGH (ref 0.2–1.0)
Monocytes Relative: 19 %
Myelocytes: 0 %
Neutro Abs: 16.9 10*3/uL — ABNORMAL HIGH (ref 1.4–6.5)
Neutrophils Relative %: 68 %
Other: 0 %
Platelets: 263 10*3/uL (ref 150–440)
Promyelocytes Absolute: 0 %
RBC: 7.16 MIL/uL — ABNORMAL HIGH (ref 4.40–5.90)
RDW: 26.1 % — ABNORMAL HIGH (ref 11.5–14.5)
WBC: 24.8 10*3/uL — ABNORMAL HIGH (ref 3.8–10.6)
nRBC: 0 /100 WBC

## 2015-08-29 NOTE — Telephone Encounter (Signed)
-----   Message from April M Miller, CMA sent at 08/29/2015  9:03 AM EST ----- Regarding: Results.  Hey Dr. Caryn Section   Has the patient's urine culture results come in yet. I've already given him the PSA results.

## 2015-08-29 NOTE — Telephone Encounter (Signed)
There was no sign of infection on the culture, but there were many uric acid crystals, which is usually an indication of kidney or bladder stone. He should have a repeat urinalysis in 1 week to see if the blood clears up. If it does not we will need run some other tests.

## 2015-08-30 NOTE — Telephone Encounter (Signed)
Patient advised as below. Follow up appointment scheduled 09/03/2015 at 11am.

## 2015-09-01 ENCOUNTER — Other Ambulatory Visit
Admission: RE | Admit: 2015-09-01 | Discharge: 2015-09-01 | Disposition: A | Discharge status: Home / Self Care | Payer: Medicare Other | Source: Ambulatory Visit | Attending: Nurse Practitioner | Admitting: Nurse Practitioner

## 2015-09-01 DIAGNOSIS — K5010 Crohn's disease of large intestine without complications: Secondary | ICD-10-CM | POA: Insufficient documentation

## 2015-09-01 DIAGNOSIS — R197 Diarrhea, unspecified: Secondary | ICD-10-CM | POA: Diagnosis not present

## 2015-09-01 LAB — C DIFFICILE QUICK SCREEN W PCR REFLEX
C Diff antigen: NEGATIVE
C Diff interpretation: NEGATIVE
C Diff toxin: NEGATIVE

## 2015-09-03 ENCOUNTER — Encounter: Payer: Self-pay | Admitting: Family Medicine

## 2015-09-03 ENCOUNTER — Ambulatory Visit (INDEPENDENT_AMBULATORY_CARE_PROVIDER_SITE_OTHER): Payer: Medicare Other | Admitting: Family Medicine

## 2015-09-03 VITALS — BP 128/68 | HR 82 | Temp 97.6°F | Resp 16 | Wt 184.0 lb

## 2015-09-03 DIAGNOSIS — R8299 Other abnormal findings in urine: Secondary | ICD-10-CM | POA: Diagnosis not present

## 2015-09-03 DIAGNOSIS — R82998 Other abnormal findings in urine: Secondary | ICD-10-CM

## 2015-09-03 DIAGNOSIS — R319 Hematuria, unspecified: Secondary | ICD-10-CM | POA: Diagnosis not present

## 2015-09-03 LAB — POCT URINALYSIS DIPSTICK
Bilirubin, UA: NEGATIVE
Glucose, UA: NEGATIVE
KETONES UA: NEGATIVE
LEUKOCYTES UA: NEGATIVE
Nitrite, UA: NEGATIVE
PH UA: 5
PROTEIN UA: NEGATIVE
Spec Grav, UA: 1.025
UROBILINOGEN UA: NEGATIVE

## 2015-09-03 NOTE — Progress Notes (Signed)
       Patient: Ruben [name] Male    DOB: 1945/11/24   69 y.o.   MRN: [id number] Visit Date: 09/03/2015  Today's Provider: Lelon Huh, MD   Chief Complaint  Patient presents with  . Hematuria    recheck   Subjective:    HPI  Hematuria follow up: Patient was last seen 08/27/2015 for an annual wellness visit. A urinalysis was checked   showing large blood and many uric acid crystals  in his urine.  Urine culture was negative. He did report having gross blood in urine, diarrhea and lower abdominal pain prior to his recent office visit. He has had not symptoms since then.      No Known Allergies Previous Medications   ASPIRIN 81 MG TABLET    Take 81 mg by mouth daily.   CHOLECALCIFEROL (VITAMIN D3) 1000 UNITS CAPS    Take by mouth.   CYANOCOBALAMIN (RA VITAMIN B-12 TR) 1000 MCG TBCR    Place one tablet under tongue once a day as needed.   FOLIC ACID (FOLVITE) 1 MG TABLET    TAKE 1 TABLET (1,000 MCG TOTAL) BY MOUTH ONCE DAILY. MUST KEEP APPT FOR FURTHER REFILLS.   LATANOPROST (XALATAN) 0.005 % OPHTHALMIC SOLUTION    Apply to eye.   MULTIPLE VITAMINS-MINERALS (CENTRUM SILVER ULTRA MENS) TABS    Take by mouth.   OMEGA-3 FATTY ACIDS (KP FISH OIL) 1200 MG CAPS    Take by mouth.    Review of Systems  Constitutional: Negative for fever, chills and appetite change.  Respiratory: Negative for chest tightness, shortness of breath and wheezing.   Cardiovascular: Negative for chest pain and palpitations.  Gastrointestinal: Negative for nausea, vomiting and abdominal pain.    Social History  Substance Use Topics  . Smoking status: Former Smoker -- 0.25 packs/day for 20 years    Types: Cigarettes    Quit date: 09/28/2003  . Smokeless tobacco: Not on file  . Alcohol Use: 1.2 oz/week    2 Glasses of wine per week     Comment: 1 glass of red wine daily   Objective:   BP 128/68 mmHg  Pulse 82  Temp(Src) 97.6 F (36.4 C) (Oral)  Resp 16  Wt 184 lb (83.462 kg)  SpO2  99%  Physical Exam General appearance: alert, well developed, well nourished, cooperative and in no distress Head: Normocephalic, without obvious abnormality, atraumatic Lungs: Respirations even and unlabored Extremities: No gross deformities Skin: Skin color, texture, turgor normal. No rashes seen  Psych: Appropriate mood and affect. Neurologic: Mental status: Alert, oriented to person, place, and time, thought content appropriate.    Results for orders placed or performed in visit on 09/03/15  POCT Urinalysis Dipstick  Result Value Ref Range   Color, UA yellow    Clarity, UA clear    Glucose, UA Negative    Bilirubin, UA Negative    Ketones, UA Negative    Spec Grav, UA 1.025    Blood, UA Trace (Hem)    pH, UA 5.0    Protein, UA Negative    Urobilinogen, UA negative    Nitrite, UA Negative    Leukocytes, UA Negative Negative       Assessment & Plan:     1. Hematuria Resolved. Most like secondary to uric acid stone - POCT Urinalysis Dipstick  2. Uricosuria  - Uric acid       Lelon Huh, MD  [facility]

## 2015-09-04 LAB — URIC ACID
URIC ACID: 13.1 mg/dL — AB (ref 3.7–8.6)
URIC ACID: 11.9 mg/dL — AB (ref 3.7–8.6)

## 2015-09-04 LAB — STOOL CULTURE

## 2015-09-04 LAB — SPECIMEN STATUS REPORT

## 2015-09-05 ENCOUNTER — Telehealth: Payer: Self-pay | Admitting: Family Medicine

## 2015-09-05 DIAGNOSIS — E790 Hyperuricemia without signs of inflammatory arthritis and tophaceous disease: Secondary | ICD-10-CM | POA: Insufficient documentation

## 2015-09-05 LAB — O&P RESULT

## 2015-09-05 LAB — GIARDIA, EIA; OVA/PARASITE: Giardia Ag, Stl: NEGATIVE

## 2015-09-05 MED ORDER — ALLOPURINOL 100 MG PO TABS: 100.0000 mg | ORAL_TABLET | Freq: Every day | ORAL | Status: DC

## 2015-09-05 NOTE — Telephone Encounter (Signed)
Spoke with patient and advised him as below. Patient verbally voiced understanding. Medication sent into pharmacy.

## 2015-09-05 NOTE — Telephone Encounter (Signed)
Please advise patient that uric levels are very high. This is probably a result of polycythemia vera. This is associated with high risk of kidney stones and gout attacks and needs to be treated. He needs to start allopurinol 135m one a day, #30 no refills. . Need to recheck uric acid levels in one month to see how well it is working. We can contact him January for follow up

## 2015-09-06 ENCOUNTER — Telehealth: Payer: Self-pay | Admitting: Hematology and Oncology

## 2015-09-06 ENCOUNTER — Telehealth: Payer: Self-pay

## 2015-09-06 NOTE — Telephone Encounter (Signed)
  Ok to take allopurinol.   Please call patient.  M

## 2015-09-06 NOTE — Telephone Encounter (Signed)
Patient received a new Rx from Dr. Caryn Section and wants to make sure it is okay with you. Med is called Allopurinol 139m tablet. 1 per mouth daily. Will this be okay? Thanks!

## 2015-09-06 NOTE — Telephone Encounter (Signed)
Called pt per MD to report that it is ok to take allopurinol per MD.  Pt verbalized an understanding

## 2015-09-26 ENCOUNTER — Telehealth: Payer: Self-pay | Admitting: *Deleted

## 2015-09-26 ENCOUNTER — Ambulatory Visit (INDEPENDENT_AMBULATORY_CARE_PROVIDER_SITE_OTHER): Payer: Medicare Other | Admitting: Family Medicine

## 2015-09-26 ENCOUNTER — Encounter: Payer: Self-pay | Admitting: Family Medicine

## 2015-09-26 VITALS — BP 124/64 | HR 88 | Temp 98.0°F | Resp 16 | Ht 72.0 in | Wt 179.0 lb

## 2015-09-26 DIAGNOSIS — L03818 Cellulitis of other sites: Secondary | ICD-10-CM | POA: Diagnosis not present

## 2015-09-26 DIAGNOSIS — M25571 Pain in right ankle and joints of right foot: Secondary | ICD-10-CM | POA: Diagnosis not present

## 2015-09-26 MED ORDER — CEPHALEXIN 500 MG PO CAPS: 500.0000 mg | ORAL_CAPSULE | Freq: Four times a day (QID) | ORAL | Status: AC

## 2015-09-26 NOTE — Telephone Encounter (Signed)
Patient called office to schedule ov for swelling and redness in his right foot. Patient stated that his foot has been swollen for the past 3 weeks. He has a red spot above right ankle that started 3 weeks ago and has been slowly growing in size. Patient is having tingling in the bottom of his foot and pain when walking. No numbness. Scheduled appt for today at 3:45pm.

## 2015-09-26 NOTE — Progress Notes (Signed)
Patient: Ruben [name] Male    DOB: 11/16/1945   70 y.o.   MRN: [id number] Visit Date: 09/26/2015  Today's Provider: Lelon Huh, MD   Chief Complaint  Patient presents with  . Foot Swelling   Subjective:    HPI  Right foot has been swollen for the past 3 weeks. He has a red spot above inner aspect of right ankle that started 3 weeks ago and has been slowly growing in size. Patient is having tingling in the bottom of his foot and pain when walking. No numbness. Mild pain at rest.  Of interest is that he started taking allopurinol about 3 weeks ago due to very high serum uric acid in the setting of of uric acid crystals in his urine seen during an episode of hematuria (which has since completely resolved)  Lab Results  Component Value Date   LABURIC 11.9* 09/03/2015    No Known Allergies Previous Medications   ALLOPURINOL (ZYLOPRIM) 100 MG TABLET    Take 1 tablet (100 mg total) by mouth daily.   ASPIRIN 81 MG TABLET    Take 81 mg by mouth daily.   CHOLECALCIFEROL (VITAMIN D3) 1000 UNITS CAPS    Take by mouth.   CYANOCOBALAMIN (RA VITAMIN B-12 TR) 1000 MCG TBCR    Place one tablet under tongue once a day as needed.   FOLIC ACID (FOLVITE) 1 MG TABLET    TAKE 1 TABLET (1,000 MCG TOTAL) BY MOUTH ONCE DAILY. MUST KEEP APPT FOR FURTHER REFILLS.   LATANOPROST (XALATAN) 0.005 % OPHTHALMIC SOLUTION    Apply to eye.   MULTIPLE VITAMINS-MINERALS (CENTRUM SILVER ULTRA MENS) TABS    Take by mouth.   OMEGA-3 FATTY ACIDS (KP FISH OIL) 1200 MG CAPS    Take by mouth.    Review of Systems  Constitutional: Negative for fever, chills and appetite change.  Respiratory: Negative for chest tightness, shortness of breath and wheezing.   Cardiovascular: Negative for chest pain and palpitations.  Gastrointestinal: Negative for nausea, vomiting and abdominal pain.    Social History  Substance Use Topics  . Smoking status: Former Smoker -- 0.25 packs/day for 20 years    Types:  Cigarettes    Quit date: 09/28/2003  . Smokeless tobacco: Not on file  . Alcohol Use: 1.2 oz/week    2 Glasses of wine per week     Comment: 1 glass of red wine daily   Objective:   BP 124/64 mmHg  Pulse 88  Temp(Src) 98 F (36.7 C) (Oral)  Resp 16  Ht 6' (1.829 m)  Wt 179 lb (81.194 kg)  BMI 24.27 kg/m2  SpO2 97%  Physical Exam  General appearance: alert, well developed, well nourished, cooperative and in no distress Head: Normocephalic, without obvious abnormality, atraumatic Lungs: Respirations even and unlabored Extremities: No gross deformities Skin: About 3cm patch of tender purpura inner aspect right distal lower leg surround by about 1.5 cm blanching erythema. Moderate swelling in area down to ankle. Mild tenderness posterior ankle, but no calf tenderness.      Assessment & Plan:     1. Ankle pain, right Some erythema concerning for cellulitis. May also have have gout flare since recently started on allopurinol. If not much better in 2 days will consider starting colchicine.   2. Cellulitis of other specified site Will cover for cellulitis, consider adding anti-gout medication as above. Keep foot elevated when not ambulating.  - cephALEXin (KEFLEX) 500 MG capsule;  Take 1 capsule (500 mg total) by mouth 4 (four) times daily.  Dispense: 28 capsule; Refill: 0        Lelon Huh, MD  [facility]

## 2015-09-28 NOTE — Telephone Encounter (Signed)
-----   Message from [name], MD sent at 09/26/2015  4:30 PM EST ----- Regarding: call friday to see if ankle swelling is better.  Start colchicine if not.

## 2015-09-28 NOTE — Telephone Encounter (Signed)
Tried Calling patient. Left message to call back.

## 2015-09-28 NOTE — Telephone Encounter (Signed)
Please contact patient to see if ankle pain and swelling or improving since starting antibiotic Wednesday. If not we can send in rx for colchicine for gout.

## 2015-10-02 MED ORDER — COLCHICINE 0.6 MG PO TABS: ORAL_TABLET | ORAL | Status: DC

## 2015-10-02 NOTE — Telephone Encounter (Signed)
Have sent prescription to [facility], if this helps he should stay on it for a months. Need to stay on allopurinol as a preventative measure.

## 2015-10-02 NOTE — Telephone Encounter (Signed)
Spoke with patient's wide. Wife stated that she will have pt call us back.

## 2015-10-02 NOTE — Telephone Encounter (Signed)
Patient was notified. Expressed understanding.

## 2015-10-02 NOTE — Telephone Encounter (Signed)
Patient returned call. Patient stated that he still has swelling and pain in his ankle, not much improvement with antibiotic. He has two more days of the antibiotic left. Patient is willing to try the colchicine. [facility] [place].

## 2015-10-28 ENCOUNTER — Other Ambulatory Visit: Payer: Self-pay | Admitting: Hematology and Oncology

## 2015-10-28 DIAGNOSIS — D45 Polycythemia vera: Secondary | ICD-10-CM

## 2015-10-29 ENCOUNTER — Encounter: Payer: Self-pay | Admitting: Hematology and Oncology

## 2015-10-31 ENCOUNTER — Inpatient Hospital Stay: Payer: Medicare Other | Attending: Hematology and Oncology

## 2015-10-31 ENCOUNTER — Ambulatory Visit
Admission: RE | Admit: 2015-10-31 | Discharge: 2015-10-31 | Disposition: A | Discharge status: Home / Self Care | Payer: Medicare Other | Source: Ambulatory Visit | Attending: Family Medicine | Admitting: Family Medicine

## 2015-10-31 ENCOUNTER — Inpatient Hospital Stay (HOSPITAL_BASED_OUTPATIENT_CLINIC_OR_DEPARTMENT_OTHER): Payer: Medicare Other | Admitting: Family Medicine

## 2015-10-31 ENCOUNTER — Telehealth: Payer: Self-pay | Admitting: Hematology and Oncology

## 2015-10-31 ENCOUNTER — Ambulatory Visit
Admission: RE | Admit: 2015-10-31 | Discharge: 2015-10-31 | Disposition: A | Discharge status: Home / Self Care | Payer: Medicare Other | Source: Ambulatory Visit | Attending: Hematology and Oncology | Admitting: Hematology and Oncology

## 2015-10-31 ENCOUNTER — Encounter: Payer: Self-pay | Admitting: Family Medicine

## 2015-10-31 ENCOUNTER — Telehealth: Payer: Self-pay

## 2015-10-31 ENCOUNTER — Other Ambulatory Visit: Payer: Self-pay | Admitting: Family Medicine

## 2015-10-31 ENCOUNTER — Inpatient Hospital Stay: Payer: Medicare Other

## 2015-10-31 VITALS — BP 136/77 | HR 66 | Temp 97.6°F | Resp 18 | Ht 72.0 in | Wt 175.6 lb

## 2015-10-31 DIAGNOSIS — Z800 Family history of malignant neoplasm of digestive organs: Secondary | ICD-10-CM

## 2015-10-31 DIAGNOSIS — I4510 Unspecified right bundle-branch block: Secondary | ICD-10-CM | POA: Diagnosis not present

## 2015-10-31 DIAGNOSIS — M25571 Pain in right ankle and joints of right foot: Secondary | ICD-10-CM | POA: Insufficient documentation

## 2015-10-31 DIAGNOSIS — N400 Enlarged prostate without lower urinary tract symptoms: Secondary | ICD-10-CM | POA: Diagnosis not present

## 2015-10-31 DIAGNOSIS — Z79899 Other long term (current) drug therapy: Secondary | ICD-10-CM | POA: Insufficient documentation

## 2015-10-31 DIAGNOSIS — D45 Polycythemia vera: Secondary | ICD-10-CM | POA: Insufficient documentation

## 2015-10-31 DIAGNOSIS — M109 Gout, unspecified: Secondary | ICD-10-CM

## 2015-10-31 DIAGNOSIS — M25471 Effusion, right ankle: Secondary | ICD-10-CM | POA: Insufficient documentation

## 2015-10-31 DIAGNOSIS — D509 Iron deficiency anemia, unspecified: Secondary | ICD-10-CM | POA: Diagnosis not present

## 2015-10-31 DIAGNOSIS — Z8042 Family history of malignant neoplasm of prostate: Secondary | ICD-10-CM | POA: Insufficient documentation

## 2015-10-31 DIAGNOSIS — Z87891 Personal history of nicotine dependence: Secondary | ICD-10-CM | POA: Diagnosis not present

## 2015-10-31 DIAGNOSIS — Z808 Family history of malignant neoplasm of other organs or systems: Secondary | ICD-10-CM

## 2015-10-31 DIAGNOSIS — F5089 Other specified eating disorder: Secondary | ICD-10-CM | POA: Insufficient documentation

## 2015-10-31 DIAGNOSIS — Z807 Family history of other malignant neoplasms of lymphoid, hematopoietic and related tissues: Secondary | ICD-10-CM | POA: Insufficient documentation

## 2015-10-31 DIAGNOSIS — Z86010 Personal history of colonic polyps: Secondary | ICD-10-CM

## 2015-10-31 DIAGNOSIS — K5090 Crohn's disease, unspecified, without complications: Secondary | ICD-10-CM

## 2015-10-31 DIAGNOSIS — Z7982 Long term (current) use of aspirin: Secondary | ICD-10-CM | POA: Diagnosis not present

## 2015-10-31 DIAGNOSIS — M7989 Other specified soft tissue disorders: Secondary | ICD-10-CM | POA: Insufficient documentation

## 2015-10-31 LAB — CBC WITH DIFFERENTIAL/PLATELET
Basophils Absolute: 0.1 10*3/uL (ref 0–0.1)
Basophils Relative: 0 %
Eosinophils Absolute: 0.2 10*3/uL (ref 0–0.7)
Eosinophils Relative: 1 %
HCT: 42.7 % (ref 40.0–52.0)
Hemoglobin: 12.7 g/dL — ABNORMAL LOW (ref 13.0–18.0)
Lymphocytes Relative: 6 %
Lymphs Abs: 1.8 10*3/uL (ref 1.0–3.6)
MCH: 17.6 pg — ABNORMAL LOW (ref 26.0–34.0)
MCHC: 29.7 g/dL — ABNORMAL LOW (ref 32.0–36.0)
MCV: 59.3 fL — ABNORMAL LOW (ref 80.0–100.0)
Monocytes Absolute: 3.7 10*3/uL — ABNORMAL HIGH (ref 0.2–1.0)
Monocytes Relative: 13 %
Neutro Abs: 23.6 10*3/uL — ABNORMAL HIGH (ref 1.4–6.5)
Neutrophils Relative %: 80 %
Platelets: 383 10*3/uL (ref 150–440)
RBC: 7.2 MIL/uL — ABNORMAL HIGH (ref 4.40–5.90)
RDW: 24 % — ABNORMAL HIGH (ref 11.5–14.5)
WBC: 29.3 10*3/uL — ABNORMAL HIGH (ref 3.8–10.6)

## 2015-10-31 LAB — FERRITIN: Ferritin: 21 ng/mL — ABNORMAL LOW (ref 24–336)

## 2015-10-31 NOTE — Telephone Encounter (Signed)
Please revise order to ankle complete. Thanks!

## 2015-10-31 NOTE — Progress Notes (Signed)
[facility] day:  10/31/2015   Chief Complaint: Ruben [name] is a 70 y.o. 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  HPI: The patient  was last seen in the medical oncology clinic on 05/09/2015 for 1 month assessment after instituting a phlebotomy program.  He had 4 undergone phlebotomies (last 05/02/2015).  CBC included a hematocrit of 43.9, hemoglobin 13.8, MCV 73.2, platelets 279,000, white count 16,800 with an ANC of 13,200.  Hematocrit was less than 45, he did not undergo phlebotomy.   On 06/06/2015, hemoglobin was 42.8, hemoglobin 13.1, MCV 66.7, platelets 336,000 and white count 24,200.  During the interim, he has felt fine. He continues to take a baby aspirin a day. Regarding his diet he describes eating oatmeal, a salad or sandwich for lunch and regular dinner. He does not eat red meat. He is eating chicken and some fish. He states that he craves pretzels and ice cream.  He underwent colonoscopy about 3-4 years ago with Dr. Vira Agar. He sees him every 5 years for a history of polyps and Crohn's disease. He has been off oral iron since 04/25/2015. Overall patient is feeling fairly well other than swelling in his right ankle and pain. He has been treated for cellulitis as well as gout with no real improvement.  Past Medical History  Diagnosis Date  . Glaucoma   . Crohn's disease ([place])   . History of chicken pox   . Crohn disease ([place])   . BPH (benign prostatic hyperplasia)   . Glaucoma   . Right bundle branch block   . Gout     Past Surgical History  Procedure Laterality Date  . Skin lesion basal cell removed    . Wart removal      from eyelid  . Prostate surgery  2008    Prostate Biopsy in [place] to elevated PSA.  per patient normal    Family History  Problem Relation Age of Onset  . Cancer Mother     Liver  . Cancer Father     Prostate   . Cancer Sister     Hodgkins lymphoma  . Cancer Paternal Aunt     Breast    Social History:  reports that he quit smoking about 12 years ago. His smoking use included Cigarettes. He has a 5 pack-year smoking history. He does not have any smokeless tobacco history on file. He reports that he drinks about 1.2 oz of alcohol per week. He reports that he does not use illicit drugs.  He previously worked for [employer].  He volunteers for [employer].  The patient is accompanied by his wife, Ruben [name], today.  Allergies: No Known Allergies  Current Medications: Current Outpatient Prescriptions  Medication Sig Dispense Refill  . aspirin 81 MG tablet Take 81 mg by mouth daily.    . Cholecalciferol (VITAMIN D3) 1000 UNITS CAPS Take by mouth.    . Cyanocobalamin (RA VITAMIN B-12 TR) 1000 MCG TBCR Place one tablet under tongue once a day as needed.    . folic acid (FOLVITE) 1 MG tablet TAKE 1 TABLET (1,000 MCG TOTAL) BY MOUTH ONCE DAILY. MUST KEEP APPT FOR FURTHER REFILLS.    Marland Kitchen latanoprost (XALATAN) 0.005 % ophthalmic solution Apply to eye.    . Multiple Vitamins-Minerals (CENTRUM SILVER ULTRA MENS) TABS Take by mouth.    . Omega-3 Fatty Acids (KP FISH OIL) 1200  MG CAPS Take by mouth.    Marland Kitchen allopurinol (ZYLOPRIM) 100 MG tablet Take 1 tablet (100 mg total) by mouth daily. (Patient not taking: Reported on 10/31/2015) 30 tablet 0  . colchicine 0.6 MG tablet 2 tablets for one day, then one daily until gone (Patient not taking: Reported on 10/31/2015) 30 tablet 0   No current facility-administered medications for this visit.    Review of Systems:  GENERAL:  Feels fine.  Active.  No fevers or sweats.  Weight stable. PERFORMANCE STATUS (ECOG):  0 HEENT:  No visual changes, runny [name], sore throat, mouth sores or tenderness. Lungs: No shortness of breath or cough.  No hemoptysis. Cardiac:  No chest pain, palpitations, orthopnea, or PND. GI:  Diarrhea on occasion.  He has 2 bowel movements a day.  No  nausea, vomiting, constipation, melena or hematochezia.  Craves pretzels and ice cream. GU:  No urgency, frequency, dysuria, or hematuria. Musculoskeletal:  No back pain. Right ankle edema and tenderness  Skin:  No rashes or skin changes. Neuro:  Occasional tingle in toes.  No headache, numbness or weakness, balance or coordination issues. Endocrine:  No diabetes, thyroid issues, hot flashes or night sweats. Psych:  No mood changes, depression or anxiety. Pain:  No focal pain. Review of systems:  All other systems reviewed and found to be negative.   Physical Exam: Blood pressure 136/77, pulse 66, temperature 97.6 F (36.4 C), temperature source Tympanic, resp. rate 18, height 6' (1.829 m), weight 175 lb 9.5 oz (79.65 kg), SpO2 99 %. GENERAL:  Well developed, well nourished, sitting comfortably in the exam room in no acute distress. MENTAL STATUS:  Alert and oriented to person, place and time. HEAD:  Pearline Cables hair.  Normocephalic, atraumatic, face symmetric, no Cushingoid features. EYES: Glasses. Blue eyes. Pupils equal round and reactive to light and accomodation. No conjunctivitis or scleral icterus. ENT: Oropharynx clear without lesion. Tongue normal. Mucous membranes moist.  RESPIRATORY: Clear to auscultation without rales, wheezes or rhonchi. CARDIOVASCULAR: Regular rate and rhythm without murmur, rub or gallop. ABDOMEN: Soft, non-tender, with active bowel sounds, and no hepatosplenomegaly. No masses. SKIN: No rashes, ulcers or lesions. EXTREMITIES: Right ankle 2+ edema, tender to palpation, difficulty with ambulation. LYMPH NODES: No palpable cervical, supraclavicular, axillary or inguinal adenopathy  NEUROLOGICAL: Unremarkable. PSYCH: Appropriate.   Appointment on 10/31/2015  Component Date Value Ref Range Status  . WBC 10/31/2015 29.3* 3.8 - 10.6 K/uL Final  . RBC 10/31/2015 7.20* 4.40 - 5.90 MIL/uL Final  . Hemoglobin 10/31/2015 12.7* 13.0 - 18.0 g/dL Final  . HCT  10/31/2015 42.7  40.0 - 52.0 % Final  . MCV 10/31/2015 59.3* 80.0 - 100.0 fL Final  . MCH 10/31/2015 17.6* 26.0 - 34.0 pg Final  . MCHC 10/31/2015 29.7* 32.0 - 36.0 g/dL Final  . RDW 10/31/2015 24.0* 11.5 - 14.5 % Final  . Platelets 10/31/2015 383  150 - 440 K/uL Final  . Neutrophils Relative % 10/31/2015 80%   Final  . Neutro Abs 10/31/2015 23.6* 1.4 - 6.5 K/uL Final  . Lymphocytes Relative 10/31/2015 6%   Final  . Lymphs Abs 10/31/2015 1.8  1.0 - 3.6 K/uL Final  . Monocytes Relative 10/31/2015 13%   Final  . Monocytes Absolute 10/31/2015 3.7* 0.2 - 1.0 K/uL Final  . Eosinophils Relative 10/31/2015 1%   Final  . Eosinophils Absolute 10/31/2015 0.2  0 - 0.7 K/uL Final  . Basophils Relative 10/31/2015 0%   Final  . Basophils Absolute 10/31/2015 0.1  0 - 0.1 K/uL Final  . Ferritin 10/31/2015 21* 24 - 336 ng/mL Final    Assessment:  Ruben [name] is a 70 y.o. 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  He has had polycythemia dating back to 2013.  Hematocrit was 62.1 with a hemoglobin of 19.8 on 03/28/2015.  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  Erythropoietin level was 1.1 (low).  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  He began a phlebotomy program on 03/28/2015 to maintain a hematocrit goal of < 45.  He last underwent phlebotomy on 05/02/2015.  He is on a baby aspirin.  He is followed closely by GI (Dr. Vira Agar) for a history of polyps and Crohn's disease.  Last colonoscopy was 3-4 years ago.  He has become iron deficient on his phlebotomy program (ferritin 17 today).  He has pica (pretzels and ice cream).  Symptomatically, he feels fine.  Only concern on exam is right ankle swelling and tenderness.  Plan: 1. Labs today:  CBC with diff, ferritin, iron studies. 2. No phlebotomy today (hematocrit 42.7). 3. Continue aspirin 81 mg a day. 4. Guaiac negative 3. 5. Right ankle x-ray revealed tendinosis of Achilles tendon. Advised patient to elevate and wear brace for  support. 6. RTC monthly for labs (CBC with diff) +/- phlebtotomy. 7. RTC in 3 months for MD assess, labs (CBC with diff, ferritin) and +/- phlebotomy.   Evlyn Kanner, NP 10/31/2015

## 2015-10-31 NOTE — Telephone Encounter (Signed)
Called pt per NP pt does not need Phlebotomy.  No other concerns noted

## 2015-11-20 DIAGNOSIS — H401131 Primary open-angle glaucoma, bilateral, mild stage: Secondary | ICD-10-CM | POA: Diagnosis not present

## 2015-11-28 ENCOUNTER — Inpatient Hospital Stay: Payer: Medicare Other | Attending: Hematology and Oncology

## 2015-11-28 ENCOUNTER — Encounter: Payer: Self-pay | Admitting: *Deleted

## 2015-11-28 DIAGNOSIS — K5090 Crohn's disease, unspecified, without complications: Secondary | ICD-10-CM | POA: Insufficient documentation

## 2015-11-28 DIAGNOSIS — M1990 Unspecified osteoarthritis, unspecified site: Secondary | ICD-10-CM | POA: Diagnosis not present

## 2015-11-28 DIAGNOSIS — N400 Enlarged prostate without lower urinary tract symptoms: Secondary | ICD-10-CM | POA: Insufficient documentation

## 2015-11-28 DIAGNOSIS — Z808 Family history of malignant neoplasm of other organs or systems: Secondary | ICD-10-CM | POA: Insufficient documentation

## 2015-11-28 DIAGNOSIS — Z87891 Personal history of nicotine dependence: Secondary | ICD-10-CM | POA: Insufficient documentation

## 2015-11-28 DIAGNOSIS — Z7982 Long term (current) use of aspirin: Secondary | ICD-10-CM | POA: Insufficient documentation

## 2015-11-28 DIAGNOSIS — D45 Polycythemia vera: Secondary | ICD-10-CM | POA: Insufficient documentation

## 2015-11-28 DIAGNOSIS — Z800 Family history of malignant neoplasm of digestive organs: Secondary | ICD-10-CM | POA: Diagnosis not present

## 2015-11-28 DIAGNOSIS — M109 Gout, unspecified: Secondary | ICD-10-CM | POA: Insufficient documentation

## 2015-11-28 DIAGNOSIS — R040 Epistaxis: Secondary | ICD-10-CM | POA: Diagnosis not present

## 2015-11-28 DIAGNOSIS — D7581 Myelofibrosis: Secondary | ICD-10-CM | POA: Diagnosis not present

## 2015-11-28 DIAGNOSIS — Z79899 Other long term (current) drug therapy: Secondary | ICD-10-CM | POA: Insufficient documentation

## 2015-11-28 DIAGNOSIS — I4510 Unspecified right bundle-branch block: Secondary | ICD-10-CM | POA: Insufficient documentation

## 2015-11-28 DIAGNOSIS — Z8042 Family history of malignant neoplasm of prostate: Secondary | ICD-10-CM | POA: Diagnosis not present

## 2015-11-28 LAB — CBC WITH DIFFERENTIAL/PLATELET
BASOS ABS: 0 10*3/uL (ref 0–0.1)
BASOS PCT: 0 %
Band Neutrophils: 4 %
EOS ABS: 0 10*3/uL (ref 0–0.7)
EOS PCT: 0 %
HCT: 39.3 % — ABNORMAL LOW (ref 40.0–52.0)
HEMOGLOBIN: 11.9 g/dL — AB (ref 13.0–18.0)
Lymphocytes Relative: 4 %
Lymphs Abs: 1.3 10*3/uL (ref 1.0–3.6)
MCH: 17.9 pg — AB (ref 26.0–34.0)
MCHC: 30.3 g/dL — AB (ref 32.0–36.0)
MCV: 59.3 fL — ABNORMAL LOW (ref 80.0–100.0)
METAMYELOCYTES PCT: 2 %
MONO ABS: 5.4 10*3/uL — AB (ref 0.2–1.0)
MONOS PCT: 17 %
MYELOCYTES: 1 %
NEUTROS ABS: 25 10*3/uL — AB (ref 1.4–6.5)
Neutrophils Relative %: 71 %
Other: 1 %
PLATELETS: 463 10*3/uL — AB (ref 150–440)
RBC: 6.63 MIL/uL — AB (ref 4.40–5.90)
RDW: 23.9 % — ABNORMAL HIGH (ref 11.5–14.5)
WBC: 32 10*3/uL — AB (ref 3.8–10.6)
nRBC: 1 /100 WBC — ABNORMAL HIGH

## 2015-11-28 LAB — PATHOLOGIST SMEAR REVIEW

## 2015-11-28 LAB — FERRITIN: FERRITIN: 21 ng/mL — AB (ref 24–336)

## 2015-12-05 ENCOUNTER — Inpatient Hospital Stay: Payer: Medicare Other

## 2015-12-05 ENCOUNTER — Inpatient Hospital Stay (HOSPITAL_BASED_OUTPATIENT_CLINIC_OR_DEPARTMENT_OTHER): Payer: Medicare Other | Admitting: Hematology and Oncology

## 2015-12-05 VITALS — BP 136/77 | HR 73 | Temp 97.7°F | Resp 18 | Ht 72.0 in | Wt 186.5 lb

## 2015-12-05 DIAGNOSIS — Z87891 Personal history of nicotine dependence: Secondary | ICD-10-CM

## 2015-12-05 DIAGNOSIS — D45 Polycythemia vera: Secondary | ICD-10-CM

## 2015-12-05 DIAGNOSIS — K5090 Crohn's disease, unspecified, without complications: Secondary | ICD-10-CM | POA: Diagnosis not present

## 2015-12-05 DIAGNOSIS — Z7982 Long term (current) use of aspirin: Secondary | ICD-10-CM

## 2015-12-05 DIAGNOSIS — Z808 Family history of malignant neoplasm of other organs or systems: Secondary | ICD-10-CM

## 2015-12-05 DIAGNOSIS — M1990 Unspecified osteoarthritis, unspecified site: Secondary | ICD-10-CM

## 2015-12-05 DIAGNOSIS — N400 Enlarged prostate without lower urinary tract symptoms: Secondary | ICD-10-CM | POA: Diagnosis not present

## 2015-12-05 DIAGNOSIS — R040 Epistaxis: Secondary | ICD-10-CM | POA: Diagnosis not present

## 2015-12-05 DIAGNOSIS — I4510 Unspecified right bundle-branch block: Secondary | ICD-10-CM

## 2015-12-05 DIAGNOSIS — D7581 Myelofibrosis: Secondary | ICD-10-CM | POA: Diagnosis not present

## 2015-12-05 DIAGNOSIS — Z8042 Family history of malignant neoplasm of prostate: Secondary | ICD-10-CM

## 2015-12-05 DIAGNOSIS — Z79899 Other long term (current) drug therapy: Secondary | ICD-10-CM

## 2015-12-05 DIAGNOSIS — M109 Gout, unspecified: Secondary | ICD-10-CM

## 2015-12-05 DIAGNOSIS — Z800 Family history of malignant neoplasm of digestive organs: Secondary | ICD-10-CM

## 2015-12-05 LAB — COMPREHENSIVE METABOLIC PANEL
ALT: 18 U/L (ref 17–63)
AST: 34 U/L (ref 15–41)
Albumin: 3.7 g/dL (ref 3.5–5.0)
Alkaline Phosphatase: 97 U/L (ref 38–126)
Anion gap: 8 (ref 5–15)
BUN: 35 mg/dL — ABNORMAL HIGH (ref 6–20)
CO2: 21 mmol/L — ABNORMAL LOW (ref 22–32)
Calcium: 8.5 mg/dL — ABNORMAL LOW (ref 8.9–10.3)
Chloride: 106 mmol/L (ref 101–111)
Creatinine, Ser: 1.52 mg/dL — ABNORMAL HIGH (ref 0.61–1.24)
GFR calc Af Amer: 53 mL/min — ABNORMAL LOW (ref >60)
GFR calc non Af Amer: 45 mL/min — ABNORMAL LOW (ref >60)
Glucose, Bld: 112 mg/dL — ABNORMAL HIGH (ref 65–99)
Potassium: 4.7 mmol/L (ref 3.5–5.1)
Sodium: 135 mmol/L (ref 135–145)
Total Bilirubin: 0.5 mg/dL (ref 0.3–1.2)
Total Protein: 7.9 g/dL (ref 6.5–8.1)

## 2015-12-05 LAB — CBC WITH DIFFERENTIAL/PLATELET
Basophils Absolute: 0 10*3/uL (ref 0–0.1)
Basophils Relative: 0 %
Eosinophils Absolute: 0.2 10*3/uL (ref 0–0.7)
Eosinophils Relative: 1 %
HCT: 38.4 % — ABNORMAL LOW (ref 40.0–52.0)
Hemoglobin: 11.6 g/dL — ABNORMAL LOW (ref 13.0–18.0)
Lymphocytes Relative: 7 %
Lymphs Abs: 1.7 10*3/uL (ref 1.0–3.6)
MCH: 17.8 pg — ABNORMAL LOW (ref 26.0–34.0)
MCHC: 30.1 g/dL — ABNORMAL LOW (ref 32.0–36.0)
MCV: 59.2 fL — ABNORMAL LOW (ref 80.0–100.0)
Monocytes Absolute: 3.7 10*3/uL — ABNORMAL HIGH (ref 0.2–1.0)
Monocytes Relative: 15 %
Neutro Abs: 19 10*3/uL — ABNORMAL HIGH (ref 1.4–6.5)
Neutrophils Relative %: 77 %
Platelets: 315 10*3/uL (ref 150–440)
RBC: 6.48 MIL/uL — ABNORMAL HIGH (ref 4.40–5.90)
RDW: 24.5 % — ABNORMAL HIGH (ref 11.5–14.5)
WBC: 24.6 10*3/uL — ABNORMAL HIGH (ref 3.8–10.6)

## 2015-12-05 LAB — URIC ACID: Uric Acid, Serum: 11.3 mg/dL — ABNORMAL HIGH (ref 4.4–7.6)

## 2015-12-05 MED ORDER — HYDROXYUREA 500 MG PO CAPS: ORAL_CAPSULE | ORAL | Status: DC

## 2015-12-05 NOTE — Progress Notes (Signed)
[facility] day:  12/05/2015   Chief Complaint: Larell Baney is a 70 y.o. 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  At that time, he was seen for 3 month assessment after instituting a phlebotomy program.  At that time, he felt fine.  Exam was unremarkable. Labs included a hematocrit of hematocrit of 39.9, hemoglobin 12.0, platelets 236,000, WBC 25,900 with an ANC of 19,800.  MCV was 59.  He did not undergo phlebotomy (hematocrit goal < 45).  He has had folow-up counts monthly.  He has not required further phlebotomy.  Last CBC on 11/28/2015 revealed a hematocrit of 39.3, hemoglobin 11.9, platelets 463,000, WBC 32,000 with an ANC of 25,000.  Differential included 71% segs, 4% lymphocytes and 17% monocytes.  Peripheral smear revealed mild thrombocytosis, microcytic anemia, leukocytosis with predominantly mature neutrophils.  Monocytes were increased.  Rare blasts (<1%) were identified.  During the interim, he notes some bowel issues with diarrhea over Thanksgiving/Christmas.  Evaluation did not reveal anything.  He has had some right ankle swelling, initially thought to be gout.  He describes his ankle being red and being treated with cephalexin.  His uric acid has been elevated.  He is taking allopurinol.  He has not taken colchicine.  Symptomatically, he feels better.  His ankle pain has resolved/improved.  He states that he was diagnosed with tendonitis.  He comments that he has had some nose bleeds, but comments that it has been dry.  At one point, his wife notes that his nose was purple.  He has an occasional tingle in the bottom of his feet.  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  He denies any fevers, sweats or weight loss.  Past Medical History  Diagnosis Date  . Glaucoma   . Crohn's disease ([place])   . History of chicken pox   .  Crohn disease ([place])   . BPH (benign prostatic hyperplasia)   . Glaucoma   . Right bundle branch block   . Gout     Past Surgical History  Procedure Laterality Date  . Skin lesion basal cell removed    . Wart removal      from eyelid  . Prostate surgery  2008    Prostate Biopsy in [place] to elevated PSA.  per patient normal    Family History  Problem Relation Age of Onset  . Cancer Mother     Liver  . Cancer Father     Prostate  . Cancer Sister     Hodgkins lymphoma  . Cancer Paternal Aunt     Breast    Social History:  reports that he quit smoking about 12 years ago. His smoking use included Cigarettes. He has a 5 pack-year smoking history. He does not have any smokeless tobacco history on file. He reports that he drinks about 1.2 oz of alcohol per week. He reports that he does not use illicit drugs.  He previously worked for [employer].  He volunteers for [employer].  The patient is accompanied by his wife, [name], today.  Allergies: No Known Allergies  Current Medications: Current Outpatient Prescriptions  Medication Sig Dispense Refill  . aspirin 81 MG tablet Take 81 mg by mouth daily.    . Cholecalciferol 1000 units tablet Take 1,000 Units by mouth daily.    . Cyanocobalamin (  RA VITAMIN B-12 TR) 1000 MCG TBCR Place one tablet under tongue once a day as needed.    . folic acid (FOLVITE) 1 MG tablet TAKE 1 TABLET (1,000 MCG TOTAL) BY MOUTH ONCE DAILY. MUST KEEP APPT FOR FURTHER REFILLS.    . hydroxyurea (HYDREA) 500 MG capsule 500 mg po BID.  May take with food to minimize GI side effects. 60 capsule 0  . latanoprost (XALATAN) 0.005 % ophthalmic solution Apply 1 drop to eye daily.     . Multiple Vitamins-Minerals (CENTRUM SILVER ULTRA MENS) TABS Take by mouth.    . Omega-3 Fatty Acids (KP FISH OIL) 1200 MG CAPS Take by mouth.    . timolol (BETIMOL) 0.25 % ophthalmic solution Place 1 drop into both eyes daily.      No current facility-administered  medications for this visit.    Review of Systems:  GENERAL:  Feels better.  Active.  No fevers or sweats.  Weight up and down by 5 pounds. PERFORMANCE STATUS (ECOG):  0 HEENT:  Interval epistaxis.  No visual changes, runny nose, sore throat, mouth sores or tenderness. Lungs: No shortness of breath or cough.  No hemoptysis. Cardiac:  No chest pain, palpitations, orthopnea, or PND. GI:  Diarrhea over Thanksgiving/Christmas.  No nausea, vomiting, constipation, melena or hematochezia. GU:  No urgency, frequency, dysuria, or hematuria. Musculoskeletal:  No back pain.  Arthritis in hands.  Joints stiff at times.  No muscle tenderness. Extremities:  Tendonitis in right ankle.  No pain or swelling. Skin:  No rashes or skin changes. Neuro:  Tingle in bottom of feet.  No headache, numbness or weakness, balance or coordination issues. Endocrine:  No diabetes, thyroid issues, hot flashes or night sweats. Psych:  No mood changes, depression or anxiety. Pain:  No focal pain. Review of systems:  All other systems reviewed and found to be negative.   Physical Exam: Blood pressure 136/77, pulse 73, temperature 97.7 F (36.5 C), temperature source Tympanic, resp. rate 18, height 6' (1.829 m), weight 186 lb 8.2 oz (84.6 kg). GENERAL:  Well developed, well nourished, sitting comfortably in the exam room in no acute distress. MENTAL STATUS:  Alert and oriented to person, place and time. HEAD:  Pearline Cables hair.  Normocephalic, atraumatic, face symmetric, no Cushingoid features. EYES: Glasses. Blue eyes. Pupils equal round and reactive to light and accomodation. No conjunctivitis or scleral icterus. ENT: Oropharynx clear without lesion. Tongue normal. Mucous membranes moist.  RESPIRATORY: Clear to auscultation without rales, wheezes or rhonchi. CARDIOVASCULAR: Regular rate and rhythm without murmur, rub or gallop. ABDOMEN: Soft, non-tender, with active bowel sounds, and no hepatosplenomegaly. No  masses. SKIN: No rashes, ulcers or lesions. EXTREMITIES: No edema, no skin discoloration or tenderness. No palpable cords. LYMPH NODES: No palpable cervical, supraclavicular, axillary or inguinal adenopathy  NEUROLOGICAL: Unremarkable. PSYCH: Appropriate.   Appointment on 12/05/2015  Component Date Value Ref Range Status  . WBC 12/05/2015 24.6* 3.8 - 10.6 K/uL Final  . RBC 12/05/2015 6.48* 4.40 - 5.90 MIL/uL Final  . Hemoglobin 12/05/2015 11.6* 13.0 - 18.0 g/dL Final  . HCT 12/05/2015 38.4* 40.0 - 52.0 % Final  . MCV 12/05/2015 59.2* 80.0 - 100.0 fL Final  . MCH 12/05/2015 17.8* 26.0 - 34.0 pg Final  . MCHC 12/05/2015 30.1* 32.0 - 36.0 g/dL Final  . RDW 12/05/2015 24.5* 11.5 - 14.5 % Final  . Platelets 12/05/2015 315  150 - 440 K/uL Final  . Neutrophils Relative % 12/05/2015 77%   Final  . Neutro  Abs 12/05/2015 19.0* 1.4 - 6.5 K/uL Final  . Lymphocytes Relative 12/05/2015 7%   Final  . Lymphs Abs 12/05/2015 1.7  1.0 - 3.6 K/uL Corrected   CORRECTED ON 03/15 AT 2001: PREVIOUSLY REPORTED AS 1.6  . Monocytes Relative 12/05/2015 15%   Final  . Monocytes Absolute 12/05/2015 3.7* 0.2 - 1.0 K/uL Final  . Eosinophils Relative 12/05/2015 1%   Final  . Eosinophils Absolute 12/05/2015 0.2  0 - 0.7 K/uL Final  . Basophils Relative 12/05/2015 0%   Final  . Basophils Absolute 12/05/2015 0.0  0 - 0.1 K/uL Final  . Sodium 12/05/2015 135  135 - 145 mmol/L Final  . Potassium 12/05/2015 4.7  3.5 - 5.1 mmol/L Final  . Chloride 12/05/2015 106  101 - 111 mmol/L Final  . CO2 12/05/2015 21* 22 - 32 mmol/L Final  . Glucose, Bld 12/05/2015 112* 65 - 99 mg/dL Final  . BUN 12/05/2015 35* 6 - 20 mg/dL Final  . Creatinine, Ser 12/05/2015 1.52* 0.61 - 1.24 mg/dL Final  . Calcium 12/05/2015 8.5* 8.9 - 10.3 mg/dL Final  . Total Protein 12/05/2015 7.9  6.5 - 8.1 g/dL Final  . Albumin 12/05/2015 3.7  3.5 - 5.0 g/dL Final  . AST 12/05/2015 34  15 - 41 U/L Final  . ALT 12/05/2015 18  17 - 63 U/L Final  .  Alkaline Phosphatase 12/05/2015 97  38 - 126 U/L Final  . Total Bilirubin 12/05/2015 0.5  0.3 - 1.2 mg/dL Final  . GFR calc non Af Amer 12/05/2015 45* >60 mL/min Final  . GFR calc Af Amer 12/05/2015 53* >60 mL/min Final   Comment: (NOTE) The eGFR has been calculated using the CKD EPI equation. This calculation has not been validated in all clinical situations. eGFR's persistently <60 mL/min signify possible Chronic Kidney Disease.   . Anion gap 12/05/2015 8  5 - 15 Final  . Uric Acid, Serum 12/05/2015 11.3* 4.4 - 7.6 mg/dL Final    Assessment:  Dionicio Shelnutt is a 70 y.o. 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  He has had polycythemia dating back to 2013.  Hematocrit was 62.1 with a hemoglobin of 19.8 on 03/28/2015.  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  Erythropoietin level was 1.1 (low).  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  He began a phlebotomy program on 03/28/2015 to maintain a hematocrit goal of < 45.  He last underwent phlebotomy on 05/02/2015.  He is on a baby aspirin.  He is followed closely by GI (Dr. Vira Agar) for a history of polyps and Crohn's disease.  Last colonoscopy was 3-4 years ago.  He has become iron deficient on his phlebotomy program (ferritin 21 on 11/28/2015).   CBC on 11/28/2015 revealed a hematocrit of 39.3, hemoglobin 11.9, platelets 463,000, WBC 32,000 with an ANC of 25,000.  Differential included 71% segs, 4% lymphocytes and 17% monocytes.  Peripheral smear revealed leukocytosis with predominantly mature neutrophils, increased monocytes and rare blasts (<1%).  Symptomatically, he denies any B symptoms.  Exam is unremarkable.   Plan: 1.  Labs today:  CBC with diff, CMP, uric acid. 2.  Discuss recent CBC with persistent leukocytosis and rare blasts.  Discuss elevated platelet count (> goal of 400).  Discuss natural history of PV.  Discuss issues with transformation to AML and myelofibrosis typically occuring years from diagnosis.   Discuss plan for baseline bone marrow and initiation of hydroxyurea.  Discuss low risk of leukemic transformation (1-17%) with hydrea.  Discuss  typical dosing, but likely need for low dose (1-2 tabs a day).  Begin with 1 tablet a day.  Side effects reviewed. 3.  Hydroxyurea 500 mg a day; dis #60. 4.  Schedule bone marrow aspirate and biopsy. 5.  RTC weekly for CBC with diff. 6.  RTC in 2 weeks for MD assess, labs (CBC with diff), and review of bone marrow.   Lequita Asal, MD  12/05/2015

## 2015-12-05 NOTE — Patient Instructions (Signed)
Hydroxyurea capsules What is this medicine? HYDROXYUREA (hye drox ee yoor EE a) is a chemotherapy drug. This medicine is used to treat certain types of leukemias and head and neck cancer. It is also used to control the painful crises of sickle cell anemia. This medicine may be used for other purposes; ask your health care provider or pharmacist if you have questions. What should I tell my health care provider before I take this medicine? They need to know if you have any of these conditions: -HIV or AIDS -kidney disease or on hemodialysis -liver disease -low blood counts, like low white cell, platelet, or red cell counts -prior or current interferon therapy -recent or ongoing radiation therapy -an unusual or allergic reaction to hydroxyurea, other chemotherapy, other medicines, foods, dyes, or preservatives -pregnant or trying to get pregnant -breast-feeding How should I use this medicine? Take this medicine by mouth with a glass of water. Follow the directions on the prescription label. Take your medicine at regular intervals. Do not take it more often than directed. Do not stop taking except on your doctor's advice. People who are not taking this medicine should not be exposed to it. Wash your hands before and after handling your bottle or medicine. Caregivers should wear disposable gloves if they must touch the bottle or medicine. Clean up any medicine powder that spills with a damp disposable towel and throw the towel away in a closed container, such as a plastic bag. Talk to your pediatrician regarding the use of this medicine in children. Special care may be needed. Overdosage: If you think you have taken too much of this medicine contact a poison control center or emergency room at once. NOTE: This medicine is only for you. Do not share this medicine with others. What if I miss a dose? If you miss a dose, take it as soon as you can. If it is almost time for your next dose, take only that  dose. Do not take double or extra doses. What may interact with this medicine? This medicine may also interact with the following medications: -didanosine -stavudine -live virus vaccines This list may not describe all possible interactions. Give your health care provider a list of all the medicines, herbs, non-prescription drugs, or dietary supplements you use. Also tell them if you smoke, drink alcohol, or use illegal drugs. Some items may interact with your medicine. What should I watch for while using this medicine? This drug may make you feel generally unwell. This is not uncommon, as chemotherapy can affect healthy cells as well as cancer cells. Report any side effects. Continue your course of treatment even though you feel ill unless your doctor tells you to stop. You will receive regular blood tests during your treatment. Call your doctor or health care professional for advice if you get a fever, chills or sore throat, or other symptoms of a cold or flu. Do not treat yourself. This drug decreases your body's ability to fight infections. Try to avoid being around people who are sick. This medicine may increase your risk to bruise or bleed. Call your doctor or health care professional if you notice any unusual bleeding. Talk to your doctor about your risk of cancer. You may be more at risk for certain types of cancers if you take this medicine. You may need blood work done while you are taking this medicine. Do not become pregnant while taking this medicine or for at least 6 months after stopping it. Women should inform their  doctor if they wish to become pregnant or think they might be pregnant. Men should not father a child while taking this medicine and for at least a year after stopping it. There is a potential for serious side effects to an unborn child. Talk to your health care professional or pharmacist for more information. Do not breast-feed an infant while taking this medicine. This may  interfere with the ability to have or father a child. You should talk with your doctor or health care professional if you are concerned about your fertility. What side effects may I notice from receiving this medicine? Side effects that you should report to your doctor or health care professional as soon as possible: -allergic reactions like skin rash, itching or hives, swelling of the face, lips, or tongue -low blood counts - this medicine may decrease the number of white blood cells, red blood cells and platelets. You may be at increased risk for infections and bleeding. -signs of infection - fever or chills, cough, sore throat, pain or difficulty passing urine -signs of decreased platelets or bleeding - bruising, pinpoint red spots on the skin, black, tarry stools, blood in the urine -signs of decreased red blood cells - unusually weak or tired, fainting spells, lightheadedness -breathing problems -burning, redness or pain at the site of any radiation therapy -changes in skin color -confusion -mouth sores -pain, tingling, numbness in the hands or feet -seizures -skin ulcers -trouble passing urine or change in the amount of urine -vomiting Side effects that usually do not require medical attention (report to your doctor or health care professional if they continue or are bothersome): -headache -loss of appetite -red color to the face This list may not describe all possible side effects. Call your doctor for medical advice about side effects. You may report side effects to FDA at 1-800-FDA-1088. Where should I keep my medicine? Keep out of the reach of children. See product for storage instructions. Each product may have different instructions. Keep tightly closed. Throw away any unused medicine after the expiration date. NOTE: This sheet is a summary. It may not cover all possible information. If you have questions about this medicine, talk to your doctor, pharmacist, or health care  provider.    2016, Elsevier/[employer]. (2014-12-22 16:51:41)

## 2015-12-06 ENCOUNTER — Telehealth: Payer: Self-pay

## 2015-12-06 NOTE — Telephone Encounter (Addendum)
Called Pt per MD and faxed labs to PCP.  Informed MD that pt reported that he is not taking his Allopurinol.  I verbalized his Creatinine had increased and his uric acid is high.  I explained to pt that we had faxed labs to PCP and that he needs to follow up with them.  I also explained he may need to increase fluid intake per MD. I informed pt that per MD he is to take one Hydroxyurea a day.  Pt stated that the bottle says two.  I then again repeated to pt that per MD to start taking one a day. Pt verbalized no other concerns.  And verbalized he understanding on Hydroxyurea and  to follow up with PCP about it.  No other concerns noted

## 2015-12-07 ENCOUNTER — Other Ambulatory Visit: Payer: Self-pay

## 2015-12-07 ENCOUNTER — Telehealth: Payer: Self-pay | Admitting: Hematology and Oncology

## 2015-12-07 NOTE — Telephone Encounter (Signed)
At his last appointment he requested some changes be made to his chart regarding medicine on his list that he does not take and also incorrectly listed dosages of eye drops. Please contact patient's wife so that she can tell you correct information. Thanks! [phone]

## 2015-12-07 NOTE — Telephone Encounter (Signed)
Erroneous note encounter

## 2015-12-12 ENCOUNTER — Inpatient Hospital Stay: Payer: Medicare Other

## 2015-12-12 DIAGNOSIS — D45 Polycythemia vera: Secondary | ICD-10-CM | POA: Diagnosis not present

## 2015-12-12 DIAGNOSIS — D7581 Myelofibrosis: Secondary | ICD-10-CM | POA: Diagnosis not present

## 2015-12-12 DIAGNOSIS — M1990 Unspecified osteoarthritis, unspecified site: Secondary | ICD-10-CM | POA: Diagnosis not present

## 2015-12-12 DIAGNOSIS — K5090 Crohn's disease, unspecified, without complications: Secondary | ICD-10-CM | POA: Diagnosis not present

## 2015-12-12 DIAGNOSIS — R040 Epistaxis: Secondary | ICD-10-CM | POA: Diagnosis not present

## 2015-12-12 DIAGNOSIS — N400 Enlarged prostate without lower urinary tract symptoms: Secondary | ICD-10-CM | POA: Diagnosis not present

## 2015-12-12 LAB — CBC WITH DIFFERENTIAL/PLATELET
BASOS ABS: 0 10*3/uL (ref 0–0.1)
Basophils Relative: 0 %
EOS ABS: 0.2 10*3/uL (ref 0–0.7)
Eosinophils Relative: 1 %
HCT: 38.5 % — ABNORMAL LOW (ref 40.0–52.0)
HEMOGLOBIN: 11.7 g/dL — AB (ref 13.0–18.0)
LYMPHS ABS: 1.4 10*3/uL (ref 1.0–3.6)
MCH: 17.9 pg — AB (ref 26.0–34.0)
MCHC: 30.3 g/dL — ABNORMAL LOW (ref 32.0–36.0)
MCV: 59.1 fL — ABNORMAL LOW (ref 80.0–100.0)
Monocytes Absolute: 3 10*3/uL — ABNORMAL HIGH (ref 0.2–1.0)
Monocytes Relative: 12 %
Neutro Abs: 20 10*3/uL — ABNORMAL HIGH (ref 1.4–6.5)
PLATELETS: 259 10*3/uL (ref 150–440)
RBC: 6.52 MIL/uL — AB (ref 4.40–5.90)
RDW: 25 % — ABNORMAL HIGH (ref 11.5–14.5)
WBC: 24.5 10*3/uL — AB (ref 3.8–10.6)

## 2015-12-13 ENCOUNTER — Other Ambulatory Visit: Payer: Self-pay | Admitting: Radiology

## 2015-12-13 ENCOUNTER — Other Ambulatory Visit: Payer: Self-pay | Admitting: Physician Assistant

## 2015-12-14 ENCOUNTER — Ambulatory Visit
Admission: RE | Admit: 2015-12-14 | Discharge: 2015-12-14 | Disposition: A | Discharge status: Home / Self Care | Payer: Medicare Other | Source: Ambulatory Visit | Attending: Hematology and Oncology | Admitting: Hematology and Oncology

## 2015-12-14 DIAGNOSIS — D45 Polycythemia vera: Secondary | ICD-10-CM | POA: Diagnosis not present

## 2015-12-14 DIAGNOSIS — D509 Iron deficiency anemia, unspecified: Secondary | ICD-10-CM | POA: Diagnosis not present

## 2015-12-14 DIAGNOSIS — D471 Chronic myeloproliferative disease: Secondary | ICD-10-CM | POA: Diagnosis not present

## 2015-12-14 DIAGNOSIS — D72828 Other elevated white blood cell count: Secondary | ICD-10-CM | POA: Diagnosis not present

## 2015-12-14 DIAGNOSIS — D7589 Other specified diseases of blood and blood-forming organs: Secondary | ICD-10-CM | POA: Diagnosis not present

## 2015-12-14 DIAGNOSIS — Z7982 Long term (current) use of aspirin: Secondary | ICD-10-CM | POA: Insufficient documentation

## 2015-12-14 DIAGNOSIS — I4510 Unspecified right bundle-branch block: Secondary | ICD-10-CM | POA: Insufficient documentation

## 2015-12-14 DIAGNOSIS — H409 Unspecified glaucoma: Secondary | ICD-10-CM | POA: Insufficient documentation

## 2015-12-14 DIAGNOSIS — N400 Enlarged prostate without lower urinary tract symptoms: Secondary | ICD-10-CM | POA: Insufficient documentation

## 2015-12-14 DIAGNOSIS — M109 Gout, unspecified: Secondary | ICD-10-CM | POA: Insufficient documentation

## 2015-12-14 DIAGNOSIS — Z87891 Personal history of nicotine dependence: Secondary | ICD-10-CM | POA: Diagnosis not present

## 2015-12-14 DIAGNOSIS — D72829 Elevated white blood cell count, unspecified: Secondary | ICD-10-CM | POA: Diagnosis not present

## 2015-12-14 DIAGNOSIS — K5090 Crohn's disease, unspecified, without complications: Secondary | ICD-10-CM | POA: Insufficient documentation

## 2015-12-14 LAB — CBC
HCT: 38.2 % — ABNORMAL LOW (ref 40.0–52.0)
Hemoglobin: 11.7 g/dL — ABNORMAL LOW (ref 13.0–18.0)
MCH: 18.1 pg — AB (ref 26.0–34.0)
MCHC: 30.7 g/dL — AB (ref 32.0–36.0)
MCV: 58.7 fL — ABNORMAL LOW (ref 80.0–100.0)
PLATELETS: 267 10*3/uL (ref 150–440)
RBC: 6.5 MIL/uL — ABNORMAL HIGH (ref 4.40–5.90)
RDW: 25.4 % — ABNORMAL HIGH (ref 11.5–14.5)
WBC: 24.3 10*3/uL — ABNORMAL HIGH (ref 3.8–10.6)

## 2015-12-14 LAB — PROTIME-INR
INR: 1.32
PROTHROMBIN TIME: 16.5 s — AB (ref 11.4–15.0)

## 2015-12-14 LAB — APTT: aPTT: 46 seconds — ABNORMAL HIGH (ref 24–36)

## 2015-12-14 MED ORDER — HYDROCODONE-ACETAMINOPHEN 5-325 MG PO TABS
1.0000 | ORAL_TABLET | ORAL | Status: DC | PRN
  Filled 2015-12-14: qty 2

## 2015-12-14 MED ORDER — FENTANYL CITRATE (PF) 100 MCG/2ML IJ SOLN
INTRAMUSCULAR | Status: AC | PRN
  Administered 2015-12-14: 25 ug via INTRAVENOUS
  Administered 2015-12-14: 50 ug via INTRAVENOUS

## 2015-12-14 MED ORDER — SODIUM CHLORIDE 0.9 % IV SOLN
Freq: Once | INTRAVENOUS | Status: AC
  Administered 2015-12-14: 08:00:00 via INTRAVENOUS

## 2015-12-14 MED ORDER — MIDAZOLAM HCL 2 MG/2ML IJ SOLN
INTRAMUSCULAR | Status: AC | PRN
  Administered 2015-12-14 (×2): 1 mg via INTRAVENOUS

## 2015-12-14 NOTE — Procedures (Signed)
Successful CT guided bone marrow biopsy.  2 aspirates and 1 core obtained.  Minimal blood loss and no immediate complication.    

## 2015-12-14 NOTE — H&P (Signed)
Chief Complaint: Patient was seen in consultation today for bone marrow biopsy at the request of Colon C  Referring Physician(s): Corcoran,Melissa C   History of Present Illness: Ruben [name] is a 70 y.o. 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  Patient is essentially asymptomatic except for minor sinus problems.  He has loose stools associated with Crohn's disease but no Crohn's flare-up in over a year.    Past Medical History  Diagnosis Date  . Glaucoma   . Crohn's disease ([place])   . History of chicken pox   . Crohn disease ([place])   . BPH (benign prostatic hyperplasia)   . Glaucoma   . Right bundle branch block   . Gout     Past Surgical History  Procedure Laterality Date  . Skin lesion basal cell removed    . Wart removal      from eyelid  . Prostate surgery  2008    Prostate Biopsy in [place] to elevated PSA.  per patient normal    Allergies: Review of patient's allergies indicates no known allergies.  Medications: Prior to Admission medications   Medication Sig Start Date End Date Taking? Authorizing Provider  aspirin 81 MG tablet Take 81 mg by mouth daily.   Yes Historical Provider, MD  Cholecalciferol 1000 units tablet Take 1,000 Units by mouth daily.   Yes Historical Provider, MD  Cyanocobalamin (RA VITAMIN B-12 TR) 1000 MCG TBCR Place one tablet under tongue once a day as needed.   Yes Historical Provider, MD  folic acid (FOLVITE) 1 MG tablet TAKE 1 TABLET (1,000 MCG TOTAL) BY MOUTH ONCE DAILY. MUST KEEP APPT FOR FURTHER REFILLS. 03/20/15  Yes Historical Provider, MD  hydroxyurea (HYDREA) 500 MG capsule 500 mg po BID.  May take with food to minimize GI side effects. 12/05/15  Yes Lequita Asal, MD  latanoprost (XALATAN) 0.005 % ophthalmic solution Apply 1 drop to eye daily.    Yes Historical Provider, MD  Multiple Vitamins-Minerals (CENTRUM SILVER ULTRA MENS) TABS Take by mouth.   Yes Historical  Provider, MD  Omega-3 Fatty Acids (KP FISH OIL) 1200 MG CAPS Take by mouth.   Yes Historical Provider, MD  timolol (BETIMOL) 0.25 % ophthalmic solution Place 1 drop into both eyes daily.    Yes Historical Provider, MD     Family History  Problem Relation Age of Onset  . Cancer Mother     Liver  . Cancer Father     Prostate  . Cancer Sister     Hodgkins lymphoma  . Cancer Paternal Aunt     Breast    Social History   Social History  . Marital Status: Married    Spouse Name: N/A  . Number of Children: 2  . Years of Education: N/A   Occupational History  . Retired    Social History Main Topics  . Smoking status: Former Smoker -- 0.25 packs/day for 20 years    Types: Cigarettes    Quit date: 09/28/2003  . Smokeless tobacco: None  . Alcohol Use: 1.2 oz/week    2 Glasses of wine per week     Comment: 1 glass of red wine daily  . Drug Use: No  . Sexual Activity: Not Asked   Other Topics Concern  . None   Social History Narrative    ECOG Status: 0 - Asymptomatic  Review of Systems: A 12 point ROS discussed and pertinent positives are indicated in  the HPI above.  All other systems are negative.  Review of Systems  Constitutional: Negative.  Negative for fever and chills.  Respiratory: Negative.   Cardiovascular: Negative.   Gastrointestinal: Negative for abdominal pain and blood in stool.  Genitourinary: Negative.     Vital Signs: BP 134/68 mmHg  Pulse 70  Resp 18  Wt 180 lb (81.647 kg)  SpO2 99%  Physical Exam  Constitutional: He appears well-developed and well-nourished.  Cardiovascular: Normal rate and regular rhythm.   Subtle systolic murmur   Pulmonary/Chest: Effort normal and breath sounds normal.  Abdominal: Soft. He exhibits no distension. There is no tenderness.    Mallampati Score:  MD Evaluation Airway: WNL Heart: WNL Abdomen: WNL Chest/ Lungs: WNL ASA  Classification: 1 Mallampati/Airway Score: Two  Imaging: No results  found.  Labs:  CBC:  Recent Labs  10/31/15 0934 11/28/15 1035 12/05/15 1534 12/12/15 1402  WBC 29.3* 32.0* 24.6* 24.5*  HGB 12.7* 11.9* 11.6* 11.7*  HCT 42.7 39.3* 38.4* 38.5*  PLT 383 463* 315 259    COAGS:  Recent Labs  12/14/15 0732  INR 1.32  APTT 46*    BMP:  Recent Labs  12/05/15 1534  NA 135  K 4.7  CL 106  CO2 21*  GLUCOSE 112*  BUN 35*  CALCIUM 8.5*  CREATININE 1.52*  GFRNONAA 45*  GFRAA 53*    LIVER FUNCTION TESTS:  Recent Labs  12/05/15 1534  BILITOT 0.5  AST 34  ALT 18  ALKPHOS 97  PROT 7.9  ALBUMIN 3.7    TUMOR MARKERS: No results for input(s): AFPTM, CEA, CA199, CHROMGRNA in the last 8760 hours.  Assessment and Plan:  70 yo with polycythemia vera and scheduled for CT guided bone marrow biopsy.  Risks and benefits discussed with patient and informed consent obtained.  Plan for moderate sedation.   Electronically Signed: Carylon Perches 12/14/2015, 7:55 AM

## 2015-12-14 NOTE — Discharge Instructions (Signed)
Bone Marrow Aspiration and Bone Marrow Biopsy Bone marrow aspiration and bone marrow biopsy are procedures that are done to diagnose blood disorders. You may also have one of these procedures to help diagnose infections or some types of cancer. Bone marrow is the soft tissue that is inside your bones. Blood cells are produced in bone marrow. For bone marrow aspiration, a sample of tissue in liquid form is removed from inside your bone. For a bone marrow biopsy, a small core of bone marrow tissue is removed. Then these samples are examined under a microscope or tested in a lab. You may need these procedures if you have an abnormal complete blood count (CBC). The aspiration or biopsy sample is usually taken from the top of your hip bone. Sometimes, an aspiration sample is taken from your chest bone (sternum). LET YOUR HEALTH CARE PROVIDER KNOW ABOUT:  Any allergies you have.  All medicines you are taking, including vitamins, herbs, eye drops, creams, and over-the-counter medicines.  Previous problems you or members of your family have had with the use of anesthetics.  Any blood disorders you have.  Previous surgeries you have had.  Any medical conditions you may have.  Whether you are pregnant or you think that you may be pregnant. RISKS AND COMPLICATIONS Generally, this is a safe procedure. However, problems may occur, including:  Infection.  Bleeding. BEFORE THE PROCEDURE  Ask your health care provider about:  Changing or stopping your regular medicines. This is especially important if you are taking diabetes medicines or blood thinners.  Taking medicines such as aspirin and ibuprofen. These medicines can thin your blood. Do not take these medicines before your procedure if your health care provider instructs you not to.  Plan to have someone take you home after the procedure.  If you go home right after the procedure, plan to have someone with you for 24 hours. PROCEDURE   An  IV tube may be inserted into one of your veins.  The injection site will be cleaned with a germ-killing solution (antiseptic).  You will be given one or more of the following:  A medicine that helps you relax (sedative).  A medicine that numbs the area (local anesthetic).  The bone marrow sample will be removed as follows:  For an aspiration, a hollow needle will be inserted through your skin and into your bone. Bone marrow fluid will be drawn up into a syringe.  For a biopsy, your health care provider will use a hollow needle to remove a core of tissue from your bone marrow.  The needle will be removed.  A bandage (dressing) will be placed over the insertion site and taped in place. The procedure may vary among health care providers and hospitals. AFTER THE PROCEDURE  Your blood pressure, heart rate, breathing rate, and blood oxygen level will be monitored often until the medicines you were given have worn off.  Return to your normal activities as directed by your health care provider.   This information is not intended to replace advice given to you by your health care provider. Make sure you discuss any questions you have with your health care provider.   Document Released: 09/11/2004 Document Revised: 01/23/2015 Document Reviewed: 08/30/2014 Elsevier Interactive Patient Education 2016 [employer]  

## 2015-12-17 ENCOUNTER — Other Ambulatory Visit: Payer: Self-pay | Admitting: *Deleted

## 2015-12-17 DIAGNOSIS — D45 Polycythemia vera: Secondary | ICD-10-CM

## 2015-12-19 ENCOUNTER — Encounter: Payer: Self-pay | Admitting: Hematology and Oncology

## 2015-12-19 ENCOUNTER — Telehealth: Payer: Self-pay | Admitting: Hematology and Oncology

## 2015-12-19 ENCOUNTER — Inpatient Hospital Stay (HOSPITAL_BASED_OUTPATIENT_CLINIC_OR_DEPARTMENT_OTHER): Payer: Medicare Other | Admitting: Hematology and Oncology

## 2015-12-19 ENCOUNTER — Inpatient Hospital Stay: Payer: Medicare Other

## 2015-12-19 VITALS — BP 125/73 | HR 64 | Temp 95.8°F | Resp 18 | Ht 72.0 in | Wt 178.7 lb

## 2015-12-19 DIAGNOSIS — M1990 Unspecified osteoarthritis, unspecified site: Secondary | ICD-10-CM

## 2015-12-19 DIAGNOSIS — N400 Enlarged prostate without lower urinary tract symptoms: Secondary | ICD-10-CM

## 2015-12-19 DIAGNOSIS — Z87891 Personal history of nicotine dependence: Secondary | ICD-10-CM

## 2015-12-19 DIAGNOSIS — D7581 Myelofibrosis: Secondary | ICD-10-CM

## 2015-12-19 DIAGNOSIS — R040 Epistaxis: Secondary | ICD-10-CM

## 2015-12-19 DIAGNOSIS — D45 Polycythemia vera: Secondary | ICD-10-CM

## 2015-12-19 DIAGNOSIS — Z800 Family history of malignant neoplasm of digestive organs: Secondary | ICD-10-CM

## 2015-12-19 DIAGNOSIS — Z79899 Other long term (current) drug therapy: Secondary | ICD-10-CM

## 2015-12-19 DIAGNOSIS — E790 Hyperuricemia without signs of inflammatory arthritis and tophaceous disease: Secondary | ICD-10-CM

## 2015-12-19 DIAGNOSIS — Z8042 Family history of malignant neoplasm of prostate: Secondary | ICD-10-CM

## 2015-12-19 DIAGNOSIS — K5090 Crohn's disease, unspecified, without complications: Secondary | ICD-10-CM

## 2015-12-19 DIAGNOSIS — I4510 Unspecified right bundle-branch block: Secondary | ICD-10-CM

## 2015-12-19 DIAGNOSIS — Z7982 Long term (current) use of aspirin: Secondary | ICD-10-CM

## 2015-12-19 DIAGNOSIS — Z808 Family history of malignant neoplasm of other organs or systems: Secondary | ICD-10-CM

## 2015-12-19 DIAGNOSIS — M109 Gout, unspecified: Secondary | ICD-10-CM

## 2015-12-19 LAB — CBC WITH DIFFERENTIAL/PLATELET
Basophils Absolute: 0.1 10*3/uL (ref 0–0.1)
Basophils Relative: 0 %
Eosinophils Absolute: 0.3 10*3/uL (ref 0–0.7)
Eosinophils Relative: 1 %
HCT: 40 % (ref 40.0–52.0)
Hemoglobin: 12.1 g/dL — ABNORMAL LOW (ref 13.0–18.0)
Lymphocytes Relative: 7 %
Lymphs Abs: 2 10*3/uL (ref 1.0–3.6)
MCH: 17.9 pg — ABNORMAL LOW (ref 26.0–34.0)
MCHC: 30.1 g/dL — ABNORMAL LOW (ref 32.0–36.0)
MCV: 59.5 fL — ABNORMAL LOW (ref 80.0–100.0)
Monocytes Absolute: 3.7 10*3/uL — ABNORMAL HIGH (ref 0.2–1.0)
Monocytes Relative: 13 %
Neutro Abs: 23.2 10*3/uL — ABNORMAL HIGH (ref 1.4–6.5)
Neutrophils Relative %: 79 %
Platelets: 241 10*3/uL (ref 150–440)
RBC: 6.73 MIL/uL — ABNORMAL HIGH (ref 4.40–5.90)
RDW: 25.4 % — ABNORMAL HIGH (ref 11.5–14.5)
WBC: 29.4 10*3/uL — ABNORMAL HIGH (ref 3.8–10.6)

## 2015-12-19 LAB — BASIC METABOLIC PANEL
Anion gap: 1 — ABNORMAL LOW (ref 5–15)
BUN: 35 mg/dL — ABNORMAL HIGH (ref 6–20)
CO2: 27 mmol/L (ref 22–32)
Calcium: 8.6 mg/dL — ABNORMAL LOW (ref 8.9–10.3)
Chloride: 103 mmol/L (ref 101–111)
Creatinine, Ser: 1.69 mg/dL — ABNORMAL HIGH (ref 0.61–1.24)
GFR calc Af Amer: 46 mL/min — ABNORMAL LOW (ref >60)
GFR calc non Af Amer: 40 mL/min — ABNORMAL LOW (ref >60)
Glucose, Bld: 93 mg/dL (ref 65–99)
Potassium: 4.9 mmol/L (ref 3.5–5.1)
Sodium: 131 mmol/L — ABNORMAL LOW (ref 135–145)

## 2015-12-19 LAB — URIC ACID: Uric Acid, Serum: 12 mg/dL — ABNORMAL HIGH (ref 4.4–7.6)

## 2015-12-19 MED ORDER — ALLOPURINOL 300 MG PO TABS: 300.0000 mg | ORAL_TABLET | Freq: Every day | ORAL | Status: DC

## 2015-12-19 NOTE — Progress Notes (Signed)
Pt reports having a bone marrow no complications.  Otherwise no changes since last visit.  Pt has had an increase in diarrhea.  More than 4 bm's a day at times

## 2015-12-19 NOTE — Progress Notes (Signed)
[facility] day:  12/19/2015  Chief Complaint: Ruben [name] is a 70 y.o. 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  HPI: The patient  was last seen in the medical oncology clinic by me on 12/05/2015.  At that time, he was seen for reassessment.  He was doing well without need for phlebotomy since 05/02/2015.  Counts on 11/28/2015 revealed a hematocrit of 39.3, hemoglobin 11.9, platelets 463,000, WBC 32,000 with an ANC of 25,000.  Differential included 71% segs, 4% lymphocytes and 17% monocytes.  Peripheral smear revealed rare blasts (<1%).  As his platelet count was above 400,000 decision was made to start hydroxyurea at 500 mg a day.  We discussed a baseline bone marrow.  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  Marrow was packed (95-100% cellularity) with pan myelosis, multi-lineage dyspoiesis, and no significant increase in blasts.  There was moderate to focally marked reticulin fibrosis (grade 2-3/3).  Storage iron was not identified.  Flow cytometry revealed non-specific atypical myeloid findings with no increase in blasts.  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  Symptomatically, he has noted slight nausea with hydroxyurea. He has had some diarrhea.  He denies any fevers, sweats or weight loss.  He is not on allopurinol.   Past Medical History  Diagnosis Date  . Glaucoma   . Crohn's disease ([place])   . History of chicken pox   . Crohn disease ([place])   . BPH (benign prostatic hyperplasia)   . Glaucoma   . Right bundle branch block   . Gout     Past Surgical History  Procedure Laterality Date  . Skin lesion basal cell removed    . Wart removal      from  eyelid  . Prostate surgery  2008    Prostate Biopsy in [place] to elevated PSA.  per patient normal    Family History  Problem Relation Age of Onset  . Cancer Mother     Liver  . Cancer Father     Prostate  . Cancer Sister     Hodgkins lymphoma  . Cancer Paternal Aunt     Breast    Social History:  reports that he quit smoking about 12 years ago. His smoking use included Cigarettes. He has a 5 pack-year smoking history. He does not have any smokeless tobacco history on file. He reports that he drinks about 1.2 oz of alcohol per week. He reports that he does not use illicit drugs.  He previously worked for [employer].  He volunteers for [employer].  The patient is accompanied by his wife, [name], today.  Allergies: No Known Allergies  Current Medications: Current Outpatient Prescriptions  Medication Sig Dispense Refill  . aspirin 81 MG tablet Take 81 mg by mouth daily.    . Cholecalciferol 1000 units tablet Take 1,000 Units by mouth daily. Reported on 9/93/7169    . folic acid (FOLVITE) 1 MG tablet TAKE 1 TABLET (1,000 MCG TOTAL) BY MOUTH ONCE DAILY. MUST KEEP APPT FOR FURTHER REFILLS.    . hydroxyurea (HYDREA) 500 MG capsule 500 mg po BID.  May take with food to minimize GI side effects. 60 capsule 0  . latanoprost (XALATAN) 0.005 % ophthalmic solution Apply 1 drop to eye daily.     Marland Kitchen  Methylcobalamin 1000 MCG SUBL Place under the tongue.    . Multiple Vitamins-Minerals (CENTRUM SILVER ULTRA MENS) TABS Take by mouth.    . Omega-3 Fatty Acids (KP FISH OIL) 1200 MG CAPS Take by mouth.    . timolol (BETIMOL) 0.25 % ophthalmic solution Place 1 drop into both eyes daily.      No current facility-administered medications for this visit.    Review of Systems:  GENERAL:  Feels "about the same".  Active.  No fevers or sweats.  Weight up and down by 5 pounds. PERFORMANCE STATUS (ECOG):  0 HEENT:  No visual changes, runny nose, sore throat, mouth sores or  tenderness. Lungs: No shortness of breath or cough.  No hemoptysis. Cardiac:  No chest pain, palpitations, orthopnea, or PND. GI:  Mild nausea after hydroxyurea.  Little diarrhea.  No nausea, vomiting, constipation, melena or hematochezia. GU:  No urgency, frequency, dysuria, or hematuria. Musculoskeletal:  No back pain.  Arthritis in hands.  Joints stiff at times.  No muscle tenderness. Extremities:  Tendonitis in right ankle.  No pain or swelling. Skin:  No rashes or skin changes. Neuro:  Tingle in bottom of feet.  No headache, numbness or weakness, balance or coordination issues. Endocrine:  No diabetes, thyroid issues, hot flashes or night sweats. Psych:  No mood changes, depression or anxiety. Pain:  No focal pain. Review of systems:  All other systems reviewed and found to be negative.   Physical Exam: Blood pressure 125/73, pulse 64, temperature 95.8 F (35.4 C), temperature source Tympanic, resp. rate 18, height 6' (1.829 m), weight 178 lb 10.9 oz (81.05 kg). GENERAL:  Well developed, well nourished, sitting comfortably in the exam room in no acute distress. MENTAL STATUS:  Alert and oriented to person, place and time. HEAD:  Pearline Cables hair.  Normocephalic, atraumatic, face symmetric, no Cushingoid features. EYES: Glasses. Blue eyes. No conjunctivitis or scleral icterus. NEUROLOGICAL: Unremarkable. PSYCH: Appropriate.   Appointment on 12/19/2015  Component Date Value Ref Range Status  . WBC 12/19/2015 29.4* 3.8 - 10.6 K/uL Final  . RBC 12/19/2015 6.73* 4.40 - 5.90 MIL/uL Final  . Hemoglobin 12/19/2015 12.1* 13.0 - 18.0 g/dL Final  . HCT 12/19/2015 40.0  40.0 - 52.0 % Final  . MCV 12/19/2015 59.5* 80.0 - 100.0 fL Final  . MCH 12/19/2015 17.9* 26.0 - 34.0 pg Final  . MCHC 12/19/2015 30.1* 32.0 - 36.0 g/dL Final  . RDW 12/19/2015 25.4* 11.5 - 14.5 % Final  . Platelets 12/19/2015 241  150 - 440 K/uL Final  . Neutrophils Relative % 12/19/2015 79%   Final  . Neutro Abs 12/19/2015  23.2* 1.4 - 6.5 K/uL Final  . Lymphocytes Relative 12/19/2015 7%   Final  . Lymphs Abs 12/19/2015 2.0  1.0 - 3.6 K/uL Final  . Monocytes Relative 12/19/2015 13%   Final  . Monocytes Absolute 12/19/2015 3.7* 0.2 - 1.0 K/uL Final  . Eosinophils Relative 12/19/2015 1%   Final  . Eosinophils Absolute 12/19/2015 0.3  0 - 0.7 K/uL Final  . Basophils Relative 12/19/2015 0%   Final  . Basophils Absolute 12/19/2015 0.1  0 - 0.1 K/uL Final  . Sodium 12/19/2015 131* 135 - 145 mmol/L Final  . Potassium 12/19/2015 4.9  3.5 - 5.1 mmol/L Final  . Chloride 12/19/2015 103  101 - 111 mmol/L Final  . CO2 12/19/2015 27  22 - 32 mmol/L Final  . Glucose, Bld 12/19/2015 93  65 - 99 mg/dL Final  . BUN 12/19/2015 35*  6 - 20 mg/dL Final  . Creatinine, Ser 12/19/2015 1.69* 0.61 - 1.24 mg/dL Final  . Calcium 12/19/2015 8.6* 8.9 - 10.3 mg/dL Final  . GFR calc non Af Amer 12/19/2015 40* >60 mL/min Final  . GFR calc Af Amer 12/19/2015 46* >60 mL/min Final   Comment: (NOTE) The eGFR has been calculated using the CKD EPI equation. This calculation has not been validated in all clinical situations. eGFR's persistently <60 mL/min signify possible Chronic Kidney Disease.   . Anion gap 12/19/2015 1* 5 - 15 Final  . Uric Acid, Serum 12/19/2015 12.0* 4.4 - 7.6 mg/dL Final    Assessment:  Ruben [name] is a 70 y.o. 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  He has had polycythemia dating back to 2013.  Hematocrit was 62.1 with a hemoglobin of 19.8 on 03/28/2015.  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  Erythropoietin level was 1.1 (low).  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  He began a phlebotomy program on 03/28/2015 to maintain a hematocrit goal of < 45.  He last underwent phlebotomy on 05/02/2015.  He is on a baby aspirin.  He is followed closely by GI (Dr. Vira Agar) for a history of polyps and Crohn's disease.  Last colonoscopy was 3-4 years ago.  He has become iron deficient on his phlebotomy  program (ferritin 21 on 11/28/2015).   CBC on 11/28/2015 revealed a hematocrit of 39.3, hemoglobin 11.9, platelets 463,000, WBC 32,000 with an ANC of 25,000.  Differential included 71% segs, 4% lymphocytes and 17% monocytes.  Peripheral smear revealed leukocytosis with predominantly mature neutrophils, increased monocytes and rare blasts (<1%).  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  Marrow was packed (95-100% cellularity) with pan myelosis, multi-lineage dyspoiesis, and no significant increase in blasts.  There was moderate to focally marked reticulin fibrosis (grade 2-3/3).  Storage iron was not identified.  Flow cytometry revealed non-specific atypical myeloid findings with no increase in blasts.  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  He has been on hydroxyurea 500 mg a day since 12/05/2015.   CBC reveals a hematocrit of 40, hemoglobin 12.1, MCV 59.5, platelets 241,000, WBC 29,400 with AN ANC of 23,200.  BUN is 35 with a creatinine of 1.69 (Cr Cl 40 ml/min).  Uric acid is 12 (4.4-7.6).  Symptomatically, he denies any B symptoms.  He has had mild nausea secondary to hydroxyurea.  Exam is unremarkable. He has hyperuricemia.  Plan: 1.  Labs today:  CBC with diff, BMP, uric acid. 2.  Review interval bone marrow.  Discuss progression of PV to myelofibrosis and mutli-lineage dyspoiesis despite recent diagnosis.  Suspect disease has been present for a number of years.  Discuss prognostic score (INT-2).  Discuss biologic age and patient's excellent health.  Unclear if patient is a candidate for transplant or clinical trial.  No constitutional symptoms (doubt candidate for JAK inhibitor-Jakafi).   3.  Discuss using hydroxyurea for management of counts (hold at present time).  Discuss importance of hydration and  allopurinol. 4.  Rx:  Allopurinol 300 mg a day. 5.  Follow-up pending cytogenetics. 6.  RTC in 2 weeks for MD assess, labs (CBC with diff,MP, uric acid, LDH).   Lequita Asal, MD  12/19/2015, 12:41 PM

## 2015-12-20 NOTE — Telephone Encounter (Signed)
erroneous

## 2015-12-26 ENCOUNTER — Encounter: Payer: Self-pay | Admitting: Hematology and Oncology

## 2015-12-26 DIAGNOSIS — D7581 Myelofibrosis: Secondary | ICD-10-CM | POA: Insufficient documentation

## 2016-01-02 ENCOUNTER — Inpatient Hospital Stay: Payer: Medicare Other | Attending: Hematology and Oncology

## 2016-01-02 ENCOUNTER — Inpatient Hospital Stay (HOSPITAL_BASED_OUTPATIENT_CLINIC_OR_DEPARTMENT_OTHER): Payer: Medicare Other | Admitting: Hematology and Oncology

## 2016-01-02 VITALS — BP 131/78 | HR 65 | Temp 96.9°F | Resp 18 | Wt 180.3 lb

## 2016-01-02 DIAGNOSIS — Z87891 Personal history of nicotine dependence: Secondary | ICD-10-CM | POA: Insufficient documentation

## 2016-01-02 DIAGNOSIS — Z7982 Long term (current) use of aspirin: Secondary | ICD-10-CM | POA: Diagnosis not present

## 2016-01-02 DIAGNOSIS — M25572 Pain in left ankle and joints of left foot: Secondary | ICD-10-CM | POA: Diagnosis not present

## 2016-01-02 DIAGNOSIS — D45 Polycythemia vera: Secondary | ICD-10-CM | POA: Diagnosis not present

## 2016-01-02 DIAGNOSIS — D7581 Myelofibrosis: Secondary | ICD-10-CM | POA: Insufficient documentation

## 2016-01-02 DIAGNOSIS — K5090 Crohn's disease, unspecified, without complications: Secondary | ICD-10-CM | POA: Diagnosis not present

## 2016-01-02 DIAGNOSIS — Z808 Family history of malignant neoplasm of other organs or systems: Secondary | ICD-10-CM | POA: Insufficient documentation

## 2016-01-02 DIAGNOSIS — Z807 Family history of other malignant neoplasms of lymphoid, hematopoietic and related tissues: Secondary | ICD-10-CM | POA: Insufficient documentation

## 2016-01-02 DIAGNOSIS — Z8042 Family history of malignant neoplasm of prostate: Secondary | ICD-10-CM | POA: Insufficient documentation

## 2016-01-02 DIAGNOSIS — I4510 Unspecified right bundle-branch block: Secondary | ICD-10-CM | POA: Insufficient documentation

## 2016-01-02 DIAGNOSIS — Z800 Family history of malignant neoplasm of digestive organs: Secondary | ICD-10-CM

## 2016-01-02 DIAGNOSIS — M109 Gout, unspecified: Secondary | ICD-10-CM

## 2016-01-02 DIAGNOSIS — Z79899 Other long term (current) drug therapy: Secondary | ICD-10-CM | POA: Diagnosis not present

## 2016-01-02 DIAGNOSIS — N400 Enlarged prostate without lower urinary tract symptoms: Secondary | ICD-10-CM

## 2016-01-02 DIAGNOSIS — R197 Diarrhea, unspecified: Secondary | ICD-10-CM

## 2016-01-02 DIAGNOSIS — M25571 Pain in right ankle and joints of right foot: Secondary | ICD-10-CM | POA: Insufficient documentation

## 2016-01-02 DIAGNOSIS — E790 Hyperuricemia without signs of inflammatory arthritis and tophaceous disease: Secondary | ICD-10-CM

## 2016-01-02 LAB — COMPREHENSIVE METABOLIC PANEL
ALT: 13 U/L — ABNORMAL LOW (ref 17–63)
AST: 30 U/L (ref 15–41)
Albumin: 3.8 g/dL (ref 3.5–5.0)
Alkaline Phosphatase: 84 U/L (ref 38–126)
Anion gap: 5 (ref 5–15)
BUN: 31 mg/dL — ABNORMAL HIGH (ref 6–20)
CO2: 23 mmol/L (ref 22–32)
Calcium: 8.6 mg/dL — ABNORMAL LOW (ref 8.9–10.3)
Chloride: 105 mmol/L (ref 101–111)
Creatinine, Ser: 1.56 mg/dL — ABNORMAL HIGH (ref 0.61–1.24)
GFR calc Af Amer: 51 mL/min — ABNORMAL LOW (ref >60)
GFR calc non Af Amer: 44 mL/min — ABNORMAL LOW (ref >60)
Glucose, Bld: 101 mg/dL — ABNORMAL HIGH (ref 65–99)
Potassium: 4.7 mmol/L (ref 3.5–5.1)
Sodium: 133 mmol/L — ABNORMAL LOW (ref 135–145)
Total Bilirubin: 0.6 mg/dL (ref 0.3–1.2)
Total Protein: 7.7 g/dL (ref 6.5–8.1)

## 2016-01-02 LAB — CBC WITH DIFFERENTIAL/PLATELET
Basophils Absolute: 0.1 10*3/uL (ref 0–0.1)
Basophils Relative: 0 %
Eosinophils Absolute: 0.2 10*3/uL (ref 0–0.7)
Eosinophils Relative: 1 %
HCT: 36.9 % — ABNORMAL LOW (ref 40.0–52.0)
Hemoglobin: 11.2 g/dL — ABNORMAL LOW (ref 13.0–18.0)
Lymphocytes Relative: 6 %
Lymphs Abs: 1.5 10*3/uL (ref 1.0–3.6)
MCH: 18.6 pg — ABNORMAL LOW (ref 26.0–34.0)
MCHC: 30.4 g/dL — ABNORMAL LOW (ref 32.0–36.0)
MCV: 61.2 fL — ABNORMAL LOW (ref 80.0–100.0)
Monocytes Absolute: 4.7 10*3/uL — ABNORMAL HIGH (ref 0.2–1.0)
Monocytes Relative: 19 %
Neutro Abs: 18.7 10*3/uL — ABNORMAL HIGH (ref 1.4–6.5)
Neutrophils Relative %: 74 %
Platelets: 236 10*3/uL (ref 150–440)
RBC: 6.03 MIL/uL — ABNORMAL HIGH (ref 4.40–5.90)
RDW: 27.6 % — ABNORMAL HIGH (ref 11.5–14.5)
WBC: 25.1 10*3/uL — ABNORMAL HIGH (ref 3.8–10.6)

## 2016-01-02 LAB — URIC ACID: Uric Acid, Serum: 5.8 mg/dL (ref 4.4–7.6)

## 2016-01-02 LAB — LACTATE DEHYDROGENASE: LDH: 466 U/L — ABNORMAL HIGH (ref 98–192)

## 2016-01-02 LAB — FERRITIN: Ferritin: 24 ng/mL (ref 24–336)

## 2016-01-02 NOTE — Progress Notes (Signed)
States is having pain in both ankles that was 6 out of 10 this morning but is now 2 out of 10. Usually takes OTC pain reliever but has not taken today.

## 2016-01-06 ENCOUNTER — Encounter: Payer: Self-pay | Admitting: Hematology and Oncology

## 2016-01-06 NOTE — Progress Notes (Addendum)
[facility] day:  01/02/2016  Chief Complaint: Ruben [name] is a 70 y.o. 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  HPI: The patient  was last seen in the medical oncology clinic on 12/19/2015.  At that time, interval bone marrow was reviewed.  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  Symptomatically, he had slight nausea with hydroxyurea. He denies any fevers, sweats or weight loss.  He was not on allopurinol.  We discussed discontinuation of hydrea and institution of allopurinol.  He was to increase his fluid intake.    During the interim, he has felt about the same.  He notes bilateral ankle pain secondary to tendonitis.  He has chronic diarrhea.  He has been outside working with [employer] for [employer].   Past Medical History  Diagnosis Date  . Glaucoma   . Crohn's disease ([place])   . History of chicken pox   . Crohn disease ([place])   . BPH (benign prostatic hyperplasia)   . Glaucoma   . Right bundle branch block   . Gout     Past Surgical History  Procedure Laterality Date  . Skin lesion basal cell removed    . Wart removal      from eyelid  . Prostate surgery  2008    Prostate Biopsy in [place] to elevated PSA.  per patient normal    Family History  Problem Relation Age of Onset  . Cancer Mother     Liver  . Cancer Father     Prostate  . Cancer Sister     Hodgkins lymphoma  . Cancer Paternal Aunt     Breast    Social History:  reports that he quit smoking about 12 years ago. His smoking use included Cigarettes. He has a 5 pack-year smoking history. He does not have any smokeless tobacco history on file. He reports that he drinks about 1.2 oz of alcohol per week. He reports that he  does not use illicit drugs.  He previously worked for [employer].  He volunteers for [employer].  The patient is accompanied by his wife, [name], today.  Allergies: No Known Allergies  Current Medications: Current Outpatient Prescriptions  Medication Sig Dispense Refill  . allopurinol (ZYLOPRIM) 300 MG tablet Take 1 tablet (300 mg total) by mouth daily. 30 tablet 3  . aspirin 81 MG tablet Take 81 mg by mouth daily.    . Cholecalciferol 1000 units tablet Take 1,000 Units by mouth daily. Reported on 5/72/6203    . folic acid (FOLVITE) 1 MG tablet TAKE 1 TABLET (1,000 MCG TOTAL) BY MOUTH ONCE DAILY. MUST KEEP APPT FOR FURTHER REFILLS.    Marland Kitchen latanoprost (XALATAN) 0.005 % ophthalmic solution Apply 1 drop to eye daily.     . Multiple Vitamins-Minerals (CENTRUM SILVER ULTRA MENS) TABS Take by mouth.    . Omega-3 Fatty Acids (KP FISH OIL) 1200 MG CAPS Take by mouth.    . timolol (BETIMOL) 0.25 % ophthalmic solution Place 1 drop into both eyes daily.      No current facility-administered medications for this visit.    Review of Systems:  GENERAL:  Feels "about the same".  Very active.  No fevers or sweats.  Weight up 2 pounds. PERFORMANCE STATUS (ECOG):  0 HEENT:  No visual changes, runny nose, sore throat, mouth sores or tenderness. Lungs: No shortness of breath or cough.  No hemoptysis. Cardiac:  No chest pain, palpitations, orthopnea, or PND. GI:  Little diarrhea.  No nausea, vomiting, constipation, melena or hematochezia. GU:  No urgency, frequency, dysuria, or hematuria. Musculoskeletal:  No back pain.  Arthritis in hands.  Joints stiff at times.  No muscle tenderness. Extremities:  Tendonitis in ankles.  No pain or swelling. Skin:  No rashes or skin changes. Neuro:  Tingle in bottom of feet.  No headache, numbness or weakness, balance or coordination issues. Endocrine:  No diabetes, thyroid issues, hot flashes or night sweats. Psych:  No mood changes, depression or  anxiety. Pain:  No focal pain. Review of systems:  All other systems reviewed and found to be negative.   Physical Exam: Blood pressure 131/78, pulse 65, temperature 96.9 F (36.1 C), temperature source Tympanic, resp. rate 18, weight 180 lb 5.4 oz (81.8 kg). GENERAL:  Well developed, well nourished, sitting comfortably in the exam room in no acute distress. MENTAL STATUS:  Alert and oriented to person, place and time. HEAD:  Pearline Cables hair.  Normocephalic, atraumatic, face symmetric, no Cushingoid features. EYES: Glasses. Blue eyes. No conjunctivitis or scleral icterus. NEUROLOGICAL: Unremarkable. PSYCH: Appropriate.   Appointment on 01/02/2016  Component Date Value Ref Range Status  . WBC 01/02/2016 25.1* 3.8 - 10.6 K/uL Final  . RBC 01/02/2016 6.03* 4.40 - 5.90 MIL/uL Final  . Hemoglobin 01/02/2016 11.2* 13.0 - 18.0 g/dL Final  . HCT 01/02/2016 36.9* 40.0 - 52.0 % Final  . MCV 01/02/2016 61.2* 80.0 - 100.0 fL Final  . MCH 01/02/2016 18.6* 26.0 - 34.0 pg Final  . MCHC 01/02/2016 30.4* 32.0 - 36.0 g/dL Final  . RDW 01/02/2016 27.6* 11.5 - 14.5 % Final  . Platelets 01/02/2016 236  150 - 440 K/uL Final  . Neutrophils Relative % 01/02/2016 74%   Final  . Neutro Abs 01/02/2016 18.7* 1.4 - 6.5 K/uL Final  . Lymphocytes Relative 01/02/2016 6%   Final  . Lymphs Abs 01/02/2016 1.5  1.0 - 3.6 K/uL Final  . Monocytes Relative 01/02/2016 19%   Final  . Monocytes Absolute 01/02/2016 4.7* 0.2 - 1.0 K/uL Final  . Eosinophils Relative 01/02/2016 1%   Final  . Eosinophils Absolute 01/02/2016 0.2  0 - 0.7 K/uL Final  . Basophils Relative 01/02/2016 0%   Final  . Basophils Absolute 01/02/2016 0.1  0 - 0.1 K/uL Final  . Sodium 01/02/2016 133* 135 - 145 mmol/L Final  . Potassium 01/02/2016 4.7  3.5 - 5.1 mmol/L Final  . Chloride 01/02/2016 105  101 - 111 mmol/L Final  . CO2 01/02/2016 23  22 - 32 mmol/L Final  . Glucose, Bld 01/02/2016 101* 65 - 99 mg/dL Final  . BUN 01/02/2016 31* 6 - 20 mg/dL  Final  . Creatinine, Ser 01/02/2016 1.56* 0.61 - 1.24 mg/dL Final  . Calcium 01/02/2016 8.6* 8.9 - 10.3 mg/dL Final  . Total Protein 01/02/2016 7.7  6.5 - 8.1 g/dL Final  . Albumin 01/02/2016 3.8  3.5 - 5.0 g/dL Final  . AST 01/02/2016 30  15 - 41 U/L Final  . ALT 01/02/2016 13* 17 - 63 U/L Final  . Alkaline Phosphatase 01/02/2016 84  38 - 126 U/L Final  . Total Bilirubin 01/02/2016 0.6  0.3 - 1.2 mg/dL Final  . GFR calc non Af Amer 01/02/2016 44* >60 mL/min Final  . GFR calc Af Amer 01/02/2016 51* >  60 mL/min Final   Comment: (NOTE) The eGFR has been calculated using the CKD EPI equation. This calculation has not been validated in all clinical situations. eGFR's persistently <60 mL/min signify possible Chronic Kidney Disease.   . Anion gap 01/02/2016 5  5 - 15 Final  . Uric Acid, Serum 01/02/2016 5.8  4.4 - 7.6 mg/dL Final  . LDH 01/02/2016 466* 98 - 192 U/L Final  . Ferritin 01/02/2016 24  24 - 336 ng/mL Final    Assessment:  Ryer Asato is a 70 y.o. 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  He has had polycythemia dating back to 2013.  Hematocrit was 62.1 with a hemoglobin of 19.8 on 03/28/2015.  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  Erythropoietin level was 1.1 (low).  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  He began a phlebotomy program on 03/28/2015 to maintain a hematocrit goal of < 45.  He last underwent phlebotomy on 05/02/2015.  He is on a baby aspirin.  He is followed closely by GI (Dr. Vira Agar) for a history of polyps and Crohn's disease.  Last colonoscopy was 3-4 years ago.  He has become iron deficient on his phlebotomy program (ferritin 21 on 11/28/2015).   CBC on 11/28/2015 revealed a hematocrit of 39.3, hemoglobin 11.9, platelets 463,000, WBC 32,000 with an ANC of 25,000.  Differential included 71% segs, 4% lymphocytes and 17% monocytes.  Peripheral smear revealed leukocytosis with predominantly mature neutrophils, increased monocytes and rare  blasts (<1%).  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  Marrow was packed (95-100% cellularity) with pan myelosis, multi-lineage dyspoiesis, and no significant increase in blasts.  There was moderate to focally marked reticulin fibrosis (grade 2-3/3).  Storage iron was not identified.  Flow cytometry revealed non-specific atypical myeloid findings with no increase in blasts.  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  Cytogenetics were normal (46, XY).  He was briefly on hydroxyurea 500 mg a day (12/05/2015 - 12/19/2015).   He began allopurinol on 12/19/2015.  Uric acid has decreased from 12 to 5.8.  Creatinine has improved from 1.69 to 1.56.  LDH is 466.  Counts include a hematocrit of 36.9, platelets 236,000, and WBC of 25,100.  Symptomatically, he denies any B symptoms.  He has been active.  Exam is unremarkable.  Plan: 1.  Labs today:  CBC with diff, BMP, uric acid. 2.  Continue allopurinol. 3.  Avoid dehydration with outdoor work.  Push fluids. 4.  Review bone marrow findings and patient's questions. 5.  Phone follow-up with patient after contact from [facility].. 7.  RTC in 1 month for MD assess, labs (CBC with diff, BMP, uric acid).   Lequita Asal, MD  01/02/2016

## 2016-01-15 ENCOUNTER — Telehealth: Payer: Self-pay | Admitting: Hematology and Oncology

## 2016-01-15 NOTE — Telephone Encounter (Signed)
She said they were expecting a call back from Dr. Mike Gip on 4/12 or 4/13 after she talked to [facility] or [facility]. They would like someone to please call with an update. Thanks!

## 2016-01-15 NOTE — Telephone Encounter (Signed)
Spoke to Dr. Mike Gip and she is still working on this.  She got a phone call but the connection was so bad they could barely hear each other and then she got a text today stating they would call after clinic and it was running late and would about 7. So hopefully she will get call back and let pt know.  I called pt's house and spoke to wife and they have been given the update that we are still trying to get in touch to ask questions and will let pt know when we know

## 2016-01-23 ENCOUNTER — Telehealth: Payer: Self-pay | Admitting: *Deleted

## 2016-01-23 NOTE — Telephone Encounter (Signed)
Called pt and left message that Dr. Mike Gip got his message that he has not heard from [facility] and she texted Dr. Evelene Croon and got response late evening yest. That she had sent info to [facility] pt coordinator and pt should hear from them in next 3-4 days. If he has not heard back by Monday to call and we will f/u again if needed.

## 2016-01-30 ENCOUNTER — Inpatient Hospital Stay: Payer: Medicare Other | Attending: Hematology and Oncology

## 2016-01-30 ENCOUNTER — Inpatient Hospital Stay (HOSPITAL_BASED_OUTPATIENT_CLINIC_OR_DEPARTMENT_OTHER): Payer: Medicare Other | Admitting: Hematology and Oncology

## 2016-01-30 VITALS — BP 138/75 | HR 73 | Temp 95.6°F | Resp 18 | Ht 72.0 in | Wt 180.2 lb

## 2016-01-30 DIAGNOSIS — D45 Polycythemia vera: Secondary | ICD-10-CM | POA: Diagnosis not present

## 2016-01-30 DIAGNOSIS — Z800 Family history of malignant neoplasm of digestive organs: Secondary | ICD-10-CM | POA: Insufficient documentation

## 2016-01-30 DIAGNOSIS — Z808 Family history of malignant neoplasm of other organs or systems: Secondary | ICD-10-CM | POA: Diagnosis not present

## 2016-01-30 DIAGNOSIS — Z79899 Other long term (current) drug therapy: Secondary | ICD-10-CM

## 2016-01-30 DIAGNOSIS — Z7982 Long term (current) use of aspirin: Secondary | ICD-10-CM | POA: Diagnosis not present

## 2016-01-30 DIAGNOSIS — N400 Enlarged prostate without lower urinary tract symptoms: Secondary | ICD-10-CM | POA: Insufficient documentation

## 2016-01-30 DIAGNOSIS — M109 Gout, unspecified: Secondary | ICD-10-CM

## 2016-01-30 DIAGNOSIS — Z87891 Personal history of nicotine dependence: Secondary | ICD-10-CM

## 2016-01-30 DIAGNOSIS — Z85828 Personal history of other malignant neoplasm of skin: Secondary | ICD-10-CM | POA: Insufficient documentation

## 2016-01-30 DIAGNOSIS — D649 Anemia, unspecified: Secondary | ICD-10-CM | POA: Diagnosis not present

## 2016-01-30 DIAGNOSIS — K5090 Crohn's disease, unspecified, without complications: Secondary | ICD-10-CM | POA: Insufficient documentation

## 2016-01-30 DIAGNOSIS — E790 Hyperuricemia without signs of inflammatory arthritis and tophaceous disease: Secondary | ICD-10-CM

## 2016-01-30 DIAGNOSIS — D7581 Myelofibrosis: Secondary | ICD-10-CM | POA: Insufficient documentation

## 2016-01-30 DIAGNOSIS — Z803 Family history of malignant neoplasm of breast: Secondary | ICD-10-CM | POA: Diagnosis not present

## 2016-01-30 DIAGNOSIS — I4510 Unspecified right bundle-branch block: Secondary | ICD-10-CM | POA: Diagnosis not present

## 2016-01-30 LAB — FERRITIN: FERRITIN: 23 ng/mL — AB (ref 24–336)

## 2016-01-30 LAB — BASIC METABOLIC PANEL
Anion gap: 7 (ref 5–15)
BUN: 38 mg/dL — ABNORMAL HIGH (ref 6–20)
CO2: 22 mmol/L (ref 22–32)
Calcium: 8.6 mg/dL — ABNORMAL LOW (ref 8.9–10.3)
Chloride: 104 mmol/L (ref 101–111)
Creatinine, Ser: 1.62 mg/dL — ABNORMAL HIGH (ref 0.61–1.24)
GFR calc Af Amer: 49 mL/min — ABNORMAL LOW (ref >60)
GFR calc non Af Amer: 42 mL/min — ABNORMAL LOW (ref >60)
Glucose, Bld: 114 mg/dL — ABNORMAL HIGH (ref 65–99)
Potassium: 4.4 mmol/L (ref 3.5–5.1)
Sodium: 133 mmol/L — ABNORMAL LOW (ref 135–145)

## 2016-01-30 LAB — CBC WITH DIFFERENTIAL/PLATELET
Basophils Absolute: 0 10*3/uL (ref 0–0.1)
Basophils Relative: 0 %
Eosinophils Absolute: 0.2 10*3/uL (ref 0–0.7)
Eosinophils Relative: 1 %
HCT: 35.4 % — ABNORMAL LOW (ref 40.0–52.0)
Hemoglobin: 10.7 g/dL — ABNORMAL LOW (ref 13.0–18.0)
Lymphocytes Relative: 7 %
Lymphs Abs: 1.5 10*3/uL (ref 1.0–3.6)
MCH: 18.2 pg — ABNORMAL LOW (ref 26.0–34.0)
MCHC: 30.2 g/dL — ABNORMAL LOW (ref 32.0–36.0)
MCV: 60.3 fL — ABNORMAL LOW (ref 80.0–100.0)
Monocytes Absolute: 2.6 10*3/uL — ABNORMAL HIGH (ref 0.2–1.0)
Monocytes Relative: 12 %
Neutro Abs: 17.3 10*3/uL — ABNORMAL HIGH (ref 1.4–6.5)
Neutrophils Relative %: 80 %
Platelets: 228 10*3/uL (ref 150–440)
RBC: 5.86 MIL/uL (ref 4.40–5.90)
RDW: 25.7 % — ABNORMAL HIGH (ref 11.5–14.5)
WBC: 21.7 10*3/uL — ABNORMAL HIGH (ref 3.8–10.6)

## 2016-01-30 LAB — URIC ACID: Uric Acid, Serum: 5.1 mg/dL (ref 4.4–7.6)

## 2016-01-30 NOTE — Progress Notes (Addendum)
[facility]-  Cancer Center  Clinic day:  01/30/2016  Chief Complaint: Ruben [name] is a 70 y.o. 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  HPI: The patient  was last seen in the medical oncology clinic on 01/02/2016.  At that time, he denied any B symptoms.  He was active.  Exam was unremarkable.  CBC included a hematocrit of 36.9, hemoglobin 11.2, MCV 61.2, platelets 236,000, WBC 25,100 with an ANC of   He has remained off his hydroxyurea.  He is taking allopurinol.  Symptomatically, he notes that his only issue is his ankles and knees.  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  He has not had been seen at [facility] (appointment 03/03/2016).   Past Medical History  Diagnosis Date  . Glaucoma   . Crohn's disease (HCC)   . History of chicken pox   . Crohn disease (HCC)   . BPH (benign prostatic hyperplasia)   . Glaucoma   . Right bundle branch block   . Gout     Past Surgical History  Procedure Laterality Date  . Skin lesion basal cell removed    . Wart removal      from eyelid  . Prostate surgery  2008    Prostate Biopsy in [place] to elevated PSA.  per patient normal    Family History  Problem Relation Age of Onset  . Cancer Mother     Liver  . Cancer Father     Prostate  . Cancer Sister     Hodgkins lymphoma  . Cancer Paternal Aunt     Breast    Social History:  reports that he quit smoking about 12 years ago. His smoking use included Cigarettes. He has a 5 pack-year smoking history. He does not have any smokeless tobacco history on file. He reports that he drinks about 1.2 oz of alcohol per week. He reports that he does not use illicit drugs.  He previously worked for [employer].  He volunteers for [employer].  The patient is accompanied by his wife, [name], today.  Allergies: No Known Allergies  Current Medications: Current Outpatient Prescriptions  Medication Sig Dispense  Refill  . allopurinol (ZYLOPRIM) 300 MG tablet Take 1 tablet (300 mg total) by mouth daily. 30 tablet 3  . aspirin 81 MG tablet Take 81 mg by mouth daily.    . Cholecalciferol 1000 units tablet Take 1,000 Units by mouth daily. Reported on 12/19/2015    . folic acid (FOLVITE) 1 MG tablet TAKE 1 TABLET (1,000 MCG TOTAL) BY MOUTH ONCE DAILY. MUST KEEP APPT FOR FURTHER REFILLS.    . latanoprost (XALATAN) 0.005 % ophthalmic solution Apply 1 drop to eye daily.     . Omega-3 Fatty Acids (KP FISH OIL) 1200 MG CAPS Take by mouth.    . timolol (BETIMOL) 0.25 % ophthalmic solution Place 1 drop into both eyes daily.     . Multiple Vitamins-Minerals (CENTRUM SILVER ULTRA MENS) TABS Take by mouth. Reported on 01/30/2016     No current facility-administered medications for this visit.    Review of Systems:  GENERAL:  Feels "about the same".  Active.  No fevers or sweats.  Weight stable. PERFORMANCE STATUS (ECOG):  0 HEENT:  No visual changes, runny nose, sore throat, mouth sores or tenderness. Lungs: No shortness of breath or cough.  No hemoptysis. Cardiac:  No chest pain, palpitations, orthopnea, or PND. GI:  Diarrhea, improved.    No nausea, vomiting, constipation, melena or hematochezia. GU:  No urgency, frequency, dysuria, or hematuria. Musculoskeletal:  No back pain.  Arthritis in hands.  Joints stiff at times.  No muscle tenderness. Extremities:  Tendonitis in ankles.  Knee discomfort.  No swelling. Skin:  No rashes or skin changes. Neuro:  Tingle in bottom of feet.  No headache, numbness or weakness, balance or coordination issues. Endocrine:  No diabetes, thyroid issues, hot flashes or night sweats. Psych:  No mood changes, depression or anxiety. Pain:  No focal pain. Review of systems:  All other systems reviewed and found to be negative.   Physical Exam: Blood pressure 138/75, pulse 73, temperature 95.6 F (35.3 C), temperature source Tympanic, resp. rate 18, height 6' (1.829 m), weight 180 lb  3.6 oz (81.75 kg). GENERAL:  Well developed, well nourished, sitting comfortably in the exam room in no acute distress. MENTAL STATUS:  Alert and oriented to person, place and time. HEAD:  Gray hair.  Normocephalic, atraumatic, face symmetric, no Cushingoid features. EYES: Glasses. Blue eyes. Pupils equal round and reactive to light and accomodation.  No conjunctivitis or scleral icterus. ENT: Oropharynx clear without lesion. Tongue normal. Mucous membranes moist.  RESPIRATORY: Clear to auscultation without rales, wheezes or rhonchi. CARDIOVASCULAR: Regular rate and rhythm without murmur, rub or gallop. ABDOMEN: Soft, non-tender, with active bowel sounds, and no hepatosplenomegaly. No masses. SKIN: No rashes, ulcers or lesions. EXTREMITIES: No edema, no skin discoloration or tenderness. No palpable cords. LYMPH NODES: No palpable cervical, supraclavicular, axillary or inguinal adenopathy  NEUROLOGICAL: Unremarkable. PSYCH: Appropriate.   Clinical Support on 01/30/2016  Component Date Value Ref Range Status  . WBC 01/30/2016 21.7* 3.8 - 10.6 K/uL Final  . RBC 01/30/2016 5.86  4.40 - 5.90 MIL/uL Final  . Hemoglobin 01/30/2016 10.7* 13.0 - 18.0 g/dL Final   RESULT REPEATED AND VERIFIED  . HCT 01/30/2016 35.4* 40.0 - 52.0 % Final  . MCV 01/30/2016 60.3* 80.0 - 100.0 fL Final  . MCH 01/30/2016 18.2* 26.0 - 34.0 pg Final  . MCHC 01/30/2016 30.2* 32.0 - 36.0 g/dL Final  . RDW 01/30/2016 25.7* 11.5 - 14.5 % Final  . Platelets 01/30/2016 228  150 - 440 K/uL Final  . Neutrophils Relative % 01/30/2016 80%   Final  . Neutro Abs 01/30/2016 17.3* 1.4 - 6.5 K/uL Final  . Lymphocytes Relative 01/30/2016 7%   Final  . Lymphs Abs 01/30/2016 1.5  1.0 - 3.6 K/uL Final  . Monocytes Relative 01/30/2016 12%   Final  . Monocytes Absolute 01/30/2016 2.6* 0.2 - 1.0 K/uL Final  . Eosinophils Relative 01/30/2016 1%   Final  . Eosinophils Absolute 01/30/2016 0.2  0 - 0.7 K/uL Final  . Basophils  Relative 01/30/2016 0%   Final  . Basophils Absolute 01/30/2016 0.0  0 - 0.1 K/uL Final  . Sodium 01/30/2016 133* 135 - 145 mmol/L Final  . Potassium 01/30/2016 4.4  3.5 - 5.1 mmol/L Final  . Chloride 01/30/2016 104  101 - 111 mmol/L Final  . CO2 01/30/2016 22  22 - 32 mmol/L Final  . Glucose, Bld 01/30/2016 114* 65 - 99 mg/dL Final  . BUN 01/30/2016 38* 6 - 20 mg/dL Final  . Creatinine, Ser 01/30/2016 1.62* 0.61 - 1.24 mg/dL Final  . Calcium 01/30/2016 8.6* 8.9 - 10.3 mg/dL Final  . GFR calc non Af Amer 01/30/2016 42* >60 mL/min Final  . GFR calc Af Amer 01/30/2016 49* >60 mL/min Final   Comment: (NOTE) The eGFR has   been calculated using the CKD EPI equation. This calculation has not been validated in all clinical situations. eGFR's persistently <60 mL/min signify possible Chronic Kidney Disease.   . Anion gap 01/30/2016 7  5 - 15 Final  . Uric Acid, Serum 01/30/2016 5.1  4.4 - 7.6 mg/dL Final  . Ferritin 01/30/2016 23* 24 - 336 ng/mL Final    Assessment:  Mical William Lafortune is a 69 y.o. 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  He has had polycythemia dating back to 2013.  Hematocrit was 62.1 with a hemoglobin of 19.8 on 03/28/2015.  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  Erythropoietin level was 1.1 (low).  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  He began a phlebotomy program on 03/28/2015 to maintain a hematocrit goal of < 45.  He last underwent phlebotomy on 05/02/2015.  He is on a baby aspirin.  He is followed closely by GI (Dr. Elliott) for a history of polyps and Crohn's disease.  Last colonoscopy was 3-4 years ago.  He has become iron deficient on his phlebotomy program (ferritin 21 on 11/28/2015).   CBC on 11/28/2015 revealed a hematocrit of 39.3, hemoglobin 11.9, platelets 463,000, WBC 32,000 with an ANC of 25,000.  Differential included 71% segs, 4% lymphocytes and 17% monocytes.  Peripheral smear revealed leukocytosis with predominantly mature neutrophils,  increased monocytes and rare blasts (<1%).  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  Marrow was packed (95-100% cellularity) with pan myelosis, multi-lineage dyspoiesis, and no significant increase in blasts.  There was moderate to focally marked reticulin fibrosis (grade 2-3/3).  Storage iron was not identified.  Flow cytometry revealed non-specific atypical myeloid findings with no increase in blasts.  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  Cytogenetics were normal (46, XY).  He was briefly on hydroxyurea 500 mg a day (12/05/2015 - 12/19/2015).   He began allopurinol on 12/19/2015.  Uric acid has decreased from 12 to 5.8 to 5.1.  Creatinine is stable at 1.62 (CrCl 42 ml/min).  LDH was 466 on 01/02/2016.  Hematocrit is drifting down (36.9 to 35.4).  Platelets are 228,000 and WBC of 21,700.  Symptomatically, he denies any B symptoms.  He has some minor joint complaints.  Exam is unremarkable.  Plan: 1.  Labs today:  CBC with diff, BMP, uric acid. 2.  Continue allopurinol. 3.  Avoid dehydration with outdoor work.  Push fluids. 4.  Follow-up at [facility] on 03/03/2016. 5.  RTC in 2 months for MD assess, labs (CBC with diff).   Melissa C Corcoran, MD  01/30/2016, 1:54 PM  

## 2016-01-30 NOTE — Progress Notes (Signed)
Diarrhea has improved.  Pain in ankles and knees have worsened per pt.   Left ankle is worse today.  Pts wife reports back of leg on left leg was reddened this morning.

## 2016-02-12 ENCOUNTER — Telehealth: Payer: Self-pay | Admitting: Hematology and Oncology

## 2016-02-12 NOTE — Telephone Encounter (Signed)
Please call patient and his wife. They have some questions and would like to get feedback from Dr. Mike Gip. Thanks.

## 2016-02-13 DIAGNOSIS — D2261 Melanocytic nevi of right upper limb, including shoulder: Secondary | ICD-10-CM | POA: Diagnosis not present

## 2016-02-13 DIAGNOSIS — D225 Melanocytic nevi of trunk: Secondary | ICD-10-CM | POA: Diagnosis not present

## 2016-02-13 DIAGNOSIS — D2272 Melanocytic nevi of left lower limb, including hip: Secondary | ICD-10-CM | POA: Diagnosis not present

## 2016-02-13 DIAGNOSIS — X32XXXA Exposure to sunlight, initial encounter: Secondary | ICD-10-CM | POA: Diagnosis not present

## 2016-02-13 DIAGNOSIS — Z85828 Personal history of other malignant neoplasm of skin: Secondary | ICD-10-CM | POA: Diagnosis not present

## 2016-02-13 DIAGNOSIS — L570 Actinic keratosis: Secondary | ICD-10-CM | POA: Diagnosis not present

## 2016-02-13 NOTE — Telephone Encounter (Signed)
The patient received a report from Dr. Evelene Croon that they are seeing 6/12 that was a review of the bone marrow bx done at our facility and she wrote a lot of info they do not understand but at the end rec: maybe another BM in future.  They wanted to know why another one because of the expense.  I spoke to [place] and she states that maybe she is looking into clinical trial and one is needed or add. Testing that is needed and must have fresh specimen.  Mike Gip was going to text Dr. Evelene Croon and see if she could tell her and we would pass info to pt.  They are agreeable to plan, it was just they are worried and the appt is over 2 weeks away

## 2016-02-21 ENCOUNTER — Encounter: Payer: Self-pay | Admitting: Hematology and Oncology

## 2016-03-03 DIAGNOSIS — Z87891 Personal history of nicotine dependence: Secondary | ICD-10-CM | POA: Diagnosis not present

## 2016-03-03 DIAGNOSIS — Z8042 Family history of malignant neoplasm of prostate: Secondary | ICD-10-CM | POA: Diagnosis not present

## 2016-03-03 DIAGNOSIS — D479 Neoplasm of uncertain behavior of lymphoid, hematopoietic and related tissue, unspecified: Secondary | ICD-10-CM | POA: Diagnosis not present

## 2016-03-03 DIAGNOSIS — E611 Iron deficiency: Secondary | ICD-10-CM | POA: Diagnosis not present

## 2016-03-03 DIAGNOSIS — Z800 Family history of malignant neoplasm of digestive organs: Secondary | ICD-10-CM | POA: Diagnosis not present

## 2016-03-03 DIAGNOSIS — D472 Monoclonal gammopathy: Secondary | ICD-10-CM | POA: Diagnosis not present

## 2016-04-02 ENCOUNTER — Other Ambulatory Visit: Payer: Self-pay | Admitting: Hematology and Oncology

## 2016-04-02 ENCOUNTER — Encounter: Payer: Self-pay | Admitting: Hematology and Oncology

## 2016-04-02 ENCOUNTER — Inpatient Hospital Stay: Payer: Medicare Other | Attending: Hematology and Oncology

## 2016-04-02 ENCOUNTER — Inpatient Hospital Stay (HOSPITAL_BASED_OUTPATIENT_CLINIC_OR_DEPARTMENT_OTHER): Payer: Medicare Other | Admitting: Hematology and Oncology

## 2016-04-02 VITALS — BP 114/68 | HR 73 | Temp 97.1°F | Resp 18 | Wt 175.9 lb

## 2016-04-02 DIAGNOSIS — D45 Polycythemia vera: Secondary | ICD-10-CM | POA: Insufficient documentation

## 2016-04-02 DIAGNOSIS — Z86010 Personal history of colonic polyps: Secondary | ICD-10-CM | POA: Insufficient documentation

## 2016-04-02 DIAGNOSIS — D7581 Myelofibrosis: Secondary | ICD-10-CM

## 2016-04-02 DIAGNOSIS — I4510 Unspecified right bundle-branch block: Secondary | ICD-10-CM | POA: Insufficient documentation

## 2016-04-02 DIAGNOSIS — Z803 Family history of malignant neoplasm of breast: Secondary | ICD-10-CM | POA: Diagnosis not present

## 2016-04-02 DIAGNOSIS — Z8042 Family history of malignant neoplasm of prostate: Secondary | ICD-10-CM | POA: Insufficient documentation

## 2016-04-02 DIAGNOSIS — Z800 Family history of malignant neoplasm of digestive organs: Secondary | ICD-10-CM | POA: Diagnosis not present

## 2016-04-02 DIAGNOSIS — Z7982 Long term (current) use of aspirin: Secondary | ICD-10-CM | POA: Insufficient documentation

## 2016-04-02 DIAGNOSIS — M109 Gout, unspecified: Secondary | ICD-10-CM | POA: Insufficient documentation

## 2016-04-02 DIAGNOSIS — Z87891 Personal history of nicotine dependence: Secondary | ICD-10-CM | POA: Insufficient documentation

## 2016-04-02 DIAGNOSIS — D72829 Elevated white blood cell count, unspecified: Secondary | ICD-10-CM | POA: Diagnosis not present

## 2016-04-02 DIAGNOSIS — Z85828 Personal history of other malignant neoplasm of skin: Secondary | ICD-10-CM

## 2016-04-02 DIAGNOSIS — Z79899 Other long term (current) drug therapy: Secondary | ICD-10-CM | POA: Insufficient documentation

## 2016-04-02 DIAGNOSIS — N400 Enlarged prostate without lower urinary tract symptoms: Secondary | ICD-10-CM | POA: Insufficient documentation

## 2016-04-02 DIAGNOSIS — M2550 Pain in unspecified joint: Secondary | ICD-10-CM | POA: Diagnosis not present

## 2016-04-02 DIAGNOSIS — K5090 Crohn's disease, unspecified, without complications: Secondary | ICD-10-CM

## 2016-04-02 DIAGNOSIS — R5383 Other fatigue: Secondary | ICD-10-CM

## 2016-04-02 DIAGNOSIS — E790 Hyperuricemia without signs of inflammatory arthritis and tophaceous disease: Secondary | ICD-10-CM

## 2016-04-02 DIAGNOSIS — Z807 Family history of other malignant neoplasms of lymphoid, hematopoietic and related tissues: Secondary | ICD-10-CM | POA: Insufficient documentation

## 2016-04-02 LAB — CBC WITH DIFFERENTIAL/PLATELET
Basophils Absolute: 0 10*3/uL (ref 0–0.1)
Basophils Relative: 0 %
Eosinophils Absolute: 0.4 10*3/uL (ref 0–0.7)
Eosinophils Relative: 1 %
HCT: 35.7 % — ABNORMAL LOW (ref 40.0–52.0)
Hemoglobin: 10.6 g/dL — ABNORMAL LOW (ref 13.0–18.0)
Lymphocytes Relative: 4 %
Lymphs Abs: 1.7 10*3/uL (ref 1.0–3.6)
MCH: 17.7 pg — ABNORMAL LOW (ref 26.0–34.0)
MCHC: 29.7 g/dL — ABNORMAL LOW (ref 32.0–36.0)
MCV: 59.4 fL — ABNORMAL LOW (ref 80.0–100.0)
Monocytes Absolute: 5.2 10*3/uL — ABNORMAL HIGH (ref 0.2–1.0)
Monocytes Relative: 12 %
Neutro Abs: 36.4 10*3/uL — ABNORMAL HIGH (ref 1.4–6.5)
Neutrophils Relative %: 83 %
Platelets: 580 10*3/uL — ABNORMAL HIGH (ref 150–440)
RBC: 6.01 MIL/uL — ABNORMAL HIGH (ref 4.40–5.90)
RDW: 25.2 % — ABNORMAL HIGH (ref 11.5–14.5)
WBC: 43.7 10*3/uL — ABNORMAL HIGH (ref 3.8–10.6)

## 2016-04-02 NOTE — Progress Notes (Signed)
[place] Clinic day:  04/02/2016  Chief Complaint: Ruben [name] is a 70 y.o. 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  HPI: The patient  was last seen in the medical oncology clinic on 01/30/2016.  At that time,  he denied any B symptoms.  He had some minor joint complaints.  Exam was unremarkable.  CBC included a hematocrit of 35.4, hemoglobin 10.7, MCV 60.3, platelets 228,000, WBC 21,700 with an ANC of 17,300.  Uric acid was 5.1.  He was seen by Dr. Adriana Simas at [facility] on 03/03/2016.  Given his symptoms of fatigue and joint pain, consideration was made for starting ruxolitinib [facility]).  A starting dose of 10 mg BID was discussed.  She agreed with a baby aspirin and keeping his hematocrit < 45 to prevent thrombosis.  Prior bone marrow staining was felt sub-potimal.  He is scheduled for bone marrow at [facility] on Monday, 04/07/2016.  Symptomatically, he notes that his only issues are fatigue and arthitis.  He continues to Psychologist, occupational for [employer].     Past Medical History  Diagnosis Date  . Glaucoma   . Crohn's disease ([place])   . History of chicken pox   . Crohn disease ([place])   . BPH (benign prostatic hyperplasia)   . Glaucoma   . Right bundle branch block   . Gout     Past Surgical History  Procedure Laterality Date  . Skin lesion basal cell removed    . Wart removal      from eyelid  . Prostate surgery  2008    Prostate Biopsy in [place] to elevated PSA.  per patient normal    Family History  Problem Relation Age of Onset  . Cancer Mother     Liver  . Cancer Father     Prostate  . Cancer Sister     Hodgkins lymphoma  . Cancer Paternal Aunt     Breast    Social History:  reports that he quit smoking about 12 years ago. His smoking use included Cigarettes. He has a 5 pack-year smoking history. He does not have any smokeless tobacco history on file. He  reports that he drinks about 1.2 oz of alcohol per week. He reports that he does not use illicit drugs.  He previously worked for [employer].  He volunteers for [employer].  The patient is accompanied by his wife, Ruben [name], today.  Allergies: No Known Allergies  Current Medications: Current Outpatient Prescriptions  Medication Sig Dispense Refill  . allopurinol (ZYLOPRIM) 300 MG tablet Take 1 tablet (300 mg total) by mouth daily. 30 tablet 3  . aspirin 81 MG tablet Take 81 mg by mouth daily.    . Cholecalciferol 1000 units tablet Take 1,000 Units by mouth daily. Reported on 12/19/2015    . Cyanocobalamin (RA VITAMIN B-12 TR) 1000 MCG TBCR     . folic acid (FOLVITE) 1 MG tablet TAKE 1 TABLET (1,000 MCG TOTAL) BY MOUTH ONCE DAILY. MUST KEEP APPT FOR FURTHER REFILLS.    Marland Kitchen latanoprost (XALATAN) 0.005 % ophthalmic solution Apply 1 drop to eye daily.     . Multiple Vitamins-Minerals (CENTRUM SILVER ULTRA MENS) TABS Take by mouth. Reported on 01/30/2016    . Omega-3 Fatty Acids (KP FISH OIL) 1200 MG CAPS Take by mouth.    . timolol (BETIMOL) 0.25 % ophthalmic solution Place 1 drop into both  eyes daily.      No current facility-administered medications for this visit.    Review of Systems:  GENERAL:  Feels "about the same".  Active.  No fevers or sweats.  Weight stable. PERFORMANCE STATUS (ECOG):  0 HEENT:  No visual changes, runny [name], sore throat, mouth sores or tenderness. Lungs: No shortness of breath or cough.  No hemoptysis. Cardiac:  No chest pain, palpitations, orthopnea, or PND. GI:  Diarrhea, improved.  No nausea, vomiting, constipation, melena or hematochezia. GU:  No urgency, frequency, dysuria, or hematuria. Musculoskeletal:  No back pain.  Arthritis in hands.  Joints stiff at times.  No muscle tenderness. Extremities:  Tendonitis in ankles.  Knee discomfort.  No swelling. Skin:  No rashes or skin changes. Neuro:  Tingle in bottom of feet.  No headache, numbness or  weakness, balance or coordination issues. Endocrine:  No diabetes, thyroid issues, hot flashes or night sweats. Psych:  No mood changes, depression or anxiety. Pain:  No focal pain. Review of systems:  All other systems reviewed and found to be negative.   Physical Exam: Blood pressure 114/68, pulse 73, temperature 97.1 F (36.2 C), temperature source Tympanic, resp. rate 18, weight 175 lb 14.8 oz (79.8 kg). GENERAL:  Well developed, well nourished, sitting comfortably in the exam room in no acute distress. MENTAL STATUS:  Alert and oriented to person, place and time. HEAD:  Pearline Cables hair.  Normocephalic, atraumatic, face symmetric, no Cushingoid features. EYES: Glasses. Blue eyes. Pupils equal round and reactive to light and accomodation.  No conjunctivitis or scleral icterus. ENT: Oropharynx clear without lesion. Tongue normal. Mucous membranes moist.  RESPIRATORY: Clear to auscultation without rales, wheezes or rhonchi. CARDIOVASCULAR: Regular rate and rhythm without murmur, rub or gallop. ABDOMEN: Soft, non-tender, with active bowel sounds, and no hepatosplenomegaly. No masses. SKIN: No rashes, ulcers or lesions. EXTREMITIES: No edema, no skin discoloration or tenderness. No palpable cords. LYMPH NODES: No palpable cervical, supraclavicular, axillary or inguinal adenopathy  NEUROLOGICAL: Unremarkable. PSYCH: Appropriate.   Appointment on 04/02/2016  Component Date Value Ref Range Status  . WBC 04/02/2016 43.7* 3.8 - 10.6 K/uL Final  . RBC 04/02/2016 6.01* 4.40 - 5.90 MIL/uL Final  . Hemoglobin 04/02/2016 10.6* 13.0 - 18.0 g/dL Final   RESULT REPEATED AND VERIFIED  . HCT 04/02/2016 35.7* 40.0 - 52.0 % Final  . MCV 04/02/2016 59.4* 80.0 - 100.0 fL Final  . MCH 04/02/2016 17.7* 26.0 - 34.0 pg Final  . MCHC 04/02/2016 29.7* 32.0 - 36.0 g/dL Final  . RDW 04/02/2016 25.2* 11.5 - 14.5 % Final  . Platelets 04/02/2016 580* 150 - 440 K/uL Final  . Neutrophils Relative %  04/02/2016 83   Final  . Lymphocytes Relative 04/02/2016 4   Final  . Monocytes Relative 04/02/2016 12   Final  . Eosinophils Relative 04/02/2016 1   Final  . Basophils Relative 04/02/2016 0   Final  . Neutro Abs 04/02/2016 36.4* 1.4 - 6.5 K/uL Final  . Lymphs Abs 04/02/2016 1.7  1.0 - 3.6 K/uL Final  . Monocytes Absolute 04/02/2016 5.2* 0.2 - 1.0 K/uL Final  . Eosinophils Absolute 04/02/2016 0.4  0 - 0.7 K/uL Final  . Basophils Absolute 04/02/2016 0.0  0 - 0.1 K/uL Final    Assessment:  Equan Cogbill is a 70 y.o. 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  He has had polycythemia dating back to 2013.  Hematocrit was 62.1 with a hemoglobin of 19.8 on 03/28/2015.  JAK 2 testing on 03/28/2015  revealed the V617F mutation.  Erythropoietin level was 1.1 (low).  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  He began a phlebotomy program on 03/28/2015 to maintain a hematocrit goal of < 45.  He last underwent phlebotomy on 05/02/2015.  He is on a baby aspirin.  He is followed closely by GI (Dr. Vira Agar) for a history of polyps and Crohn's disease.  Last colonoscopy was 3-4 years ago.  He has become iron deficient on his phlebotomy program (ferritin 21 on 11/28/2015).   CBC on 11/28/2015 revealed a hematocrit of 39.3, hemoglobin 11.9, platelets 463,000, WBC 32,000 with an ANC of 25,000.  Differential included 71% segs, 4% lymphocytes and 17% monocytes.  Peripheral smear revealed leukocytosis with predominantly mature neutrophils, increased monocytes and rare blasts (<1%).  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  Marrow was packed (95-100% cellularity) with pan myelosis, multi-lineage dyspoiesis, and no significant increase in blasts.  There was moderate to focally marked reticulin fibrosis (grade 2-3/3).  Storage iron was not identified.  Flow cytometry revealed non-specific atypical myeloid  findings with no increase in blasts.  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  Cytogenetics were normal (46, XY).  He was briefly on hydroxyurea 500 mg a day (12/05/2015 - 12/19/2015).   He began allopurinol on 12/19/2015.  Uric acid has decreased from 12 to 5.8 to 5.1.  Creatinine is stable at 1.62 (CrCl 42 ml/min).  LDH was 466 on 01/02/2016.  Hematocrit is drifting down (36.9 to 35.4).  Platelets are 228,000 and WBC of 21,700.  Symptomatically, he is fatigued.  He denies any B symptoms.  He has some minor joint complaints.  Exam is stable.  Plan: 1.  Labs today:  CBC with diff. 2.  Discuss consult at [facility].  Discuss today's counts (rising platelet count).  No plan for Hydrea at this point.  Discuss possibility of Jak[place]i in the future. Await upcoming bone marrow. 3.  Phone follow-up with Dr. Adriana Simas. 4.  Follow-up bone marrow aspirate and biopsy on 04/07/2016. 5.  RTC in 1 month for MD assess and labs (CBC with diff, CMP, LDH, uric acid).   Ruben Asal, MD  04/02/2016, 9:01 AM

## 2016-04-02 NOTE — Progress Notes (Signed)
Patient is here for a follow up, no complaints. Asking if you have talked with doctors at [facility]?

## 2016-04-07 DIAGNOSIS — D45 Polycythemia vera: Secondary | ICD-10-CM | POA: Diagnosis not present

## 2016-04-07 DIAGNOSIS — D471 Chronic myeloproliferative disease: Secondary | ICD-10-CM | POA: Diagnosis not present

## 2016-04-16 ENCOUNTER — Other Ambulatory Visit: Payer: Self-pay | Admitting: Hematology and Oncology

## 2016-04-25 NOTE — Telephone Encounter (Signed)
error 

## 2016-05-05 DIAGNOSIS — D471 Chronic myeloproliferative disease: Secondary | ICD-10-CM | POA: Diagnosis not present

## 2016-05-05 DIAGNOSIS — M2550 Pain in unspecified joint: Secondary | ICD-10-CM | POA: Diagnosis not present

## 2016-05-05 DIAGNOSIS — Z79899 Other long term (current) drug therapy: Secondary | ICD-10-CM | POA: Diagnosis not present

## 2016-05-05 DIAGNOSIS — D7581 Myelofibrosis: Secondary | ICD-10-CM | POA: Diagnosis not present

## 2016-05-05 DIAGNOSIS — R61 Generalized hyperhidrosis: Secondary | ICD-10-CM | POA: Diagnosis not present

## 2016-05-05 DIAGNOSIS — D509 Iron deficiency anemia, unspecified: Secondary | ICD-10-CM | POA: Diagnosis not present

## 2016-05-05 DIAGNOSIS — D45 Polycythemia vera: Secondary | ICD-10-CM | POA: Diagnosis not present

## 2016-05-07 ENCOUNTER — Inpatient Hospital Stay (HOSPITAL_BASED_OUTPATIENT_CLINIC_OR_DEPARTMENT_OTHER): Payer: Medicare Other | Admitting: Hematology and Oncology

## 2016-05-07 ENCOUNTER — Encounter: Payer: Self-pay | Admitting: Hematology and Oncology

## 2016-05-07 ENCOUNTER — Inpatient Hospital Stay: Payer: Medicare Other | Attending: Hematology and Oncology

## 2016-05-07 VITALS — BP 115/71 | HR 72 | Temp 96.7°F | Resp 18 | Wt 176.5 lb

## 2016-05-07 DIAGNOSIS — Z7982 Long term (current) use of aspirin: Secondary | ICD-10-CM | POA: Insufficient documentation

## 2016-05-07 DIAGNOSIS — D45 Polycythemia vera: Secondary | ICD-10-CM

## 2016-05-07 DIAGNOSIS — M129 Arthropathy, unspecified: Secondary | ICD-10-CM | POA: Diagnosis not present

## 2016-05-07 DIAGNOSIS — F17210 Nicotine dependence, cigarettes, uncomplicated: Secondary | ICD-10-CM | POA: Diagnosis not present

## 2016-05-07 DIAGNOSIS — R5383 Other fatigue: Secondary | ICD-10-CM

## 2016-05-07 DIAGNOSIS — D7581 Myelofibrosis: Secondary | ICD-10-CM

## 2016-05-07 DIAGNOSIS — I4510 Unspecified right bundle-branch block: Secondary | ICD-10-CM

## 2016-05-07 DIAGNOSIS — Z79899 Other long term (current) drug therapy: Secondary | ICD-10-CM | POA: Insufficient documentation

## 2016-05-07 DIAGNOSIS — M1990 Unspecified osteoarthritis, unspecified site: Secondary | ICD-10-CM

## 2016-05-07 DIAGNOSIS — N400 Enlarged prostate without lower urinary tract symptoms: Secondary | ICD-10-CM | POA: Diagnosis not present

## 2016-05-07 DIAGNOSIS — Z86010 Personal history of colonic polyps: Secondary | ICD-10-CM | POA: Diagnosis not present

## 2016-05-07 DIAGNOSIS — K5090 Crohn's disease, unspecified, without complications: Secondary | ICD-10-CM | POA: Insufficient documentation

## 2016-05-07 MED ORDER — RUXOLITINIB PHOSPHATE 20 MG PO TABS: 20.0000 mg | ORAL_TABLET | Freq: Two times a day (BID) | ORAL | 3 refills | Status: DC

## 2016-05-07 NOTE — Progress Notes (Signed)
Patient is here for follow up he is doing well no problems.   His oxygen level was 100%

## 2016-05-07 NOTE — Progress Notes (Signed)
[employer] [employer] day:  05/07/2016  Chief Complaint: Ruben [name] is a 70 y.o. 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  HPI: The patient  was last seen in the medical oncology clinic on 071/10/2015.  At that time,  he was fatigued.  He denied any B symptoms.  He had some minor joint complaints.  Exam was stable.  CBC revealed a hematocrit of 35.7, hemoglobin 10.6, MCV 59.4, platelets 580,000, WBC 43,700 with an ANC of 36,400.    He underwent bone marrow aspirate and biopsy on 04/07/2016 at [facility].  Marrow revealed a hypercellular marrow (> 95%) with persistent involvement by myeloid neoplasm with 5% blasts.  There was mild reticulin fibrosis.  FISH t(9;22) results were normal.  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  Cytogenetics are pending.  He saw Dr. Adriana Simas at [facility].  He was felt to be at high risk of progressing to leukemia secondary to his multiple mutations.  Secondary to symptoms of sweats and joint pain, it was recommended that he begin ruxolitinib 20 mg po BID.   Hematocrit goal remains < 45.  Once ruxolitinib begins, he was felt to benefit from ferrous sulfate 325 mg 3 times a week.  He was also felt to be a possible candidate for an MD [place] clinical trial.  He has a follow-up with Dr. Evelene Croon on 08/04/2016.  Symptomatically, he notes ongoing issues with fatigue, sweats, and arthritis.  He continues to Psychologist, occupational for [employer].     Past Medical History:  Diagnosis Date  . BPH (benign prostatic hyperplasia)   . Crohn disease ([place])   . Crohn's disease ([place])   . Glaucoma   . Glaucoma   . Gout   . History of chicken pox   . Right bundle branch block     Past Surgical History:  Procedure Laterality Date  . PROSTATE SURGERY  2008   Prostate Biopsy in [place] to elevated PSA.  per patient normal  . Skin Lesion Basal cell  removed    . wart removal     from eyelid    Family History  Problem Relation Age of Onset  . Cancer Mother     Liver  . Cancer Father     Prostate  . Cancer Sister     Hodgkins lymphoma  . Cancer Paternal Aunt     Breast    Social History:  reports that he quit smoking about 12 years ago. His smoking use included Cigarettes. He has a 5.00 pack-year smoking history. He does not have any smokeless tobacco history on file. He reports that he drinks about 1.2 oz of alcohol per week . He reports that he does not use drugs.  He previously worked for [employer].  He volunteers for [employer].  The patient is accompanied by his wife, Ruben [name], today.  Allergies: No Known Allergies  Current Medications: Current Outpatient Prescriptions  Medication Sig Dispense Refill  . allopurinol (ZYLOPRIM) 300 MG tablet TAKE 1 TABLET (300 MG TOTAL) BY MOUTH DAILY. 30 tablet 3  . aspirin 81 MG tablet Take 81 mg by mouth daily.    . Cholecalciferol 1000 units tablet Take 1,000 Units by mouth daily. Reported on 12/19/2015    . Cyanocobalamin (RA VITAMIN B-12 TR) 1000 MCG TBCR     . folic acid (FOLVITE) 1 MG tablet TAKE 1 TABLET (1,000  MCG TOTAL) BY MOUTH ONCE DAILY. MUST KEEP APPT FOR FURTHER REFILLS.    Marland Kitchen latanoprost (XALATAN) 0.005 % ophthalmic solution Apply 1 drop to eye daily.     . Multiple Vitamins-Minerals (CENTRUM SILVER ULTRA MENS) TABS Take by mouth. Reported on 01/30/2016    . Omega-3 Fatty Acids (KP FISH OIL) 1200 MG CAPS Take by mouth.    . timolol (BETIMOL) 0.25 % ophthalmic solution Place 1 drop into both eyes daily.     . ruxolitinib phosphate (JAKAFI) 20 MG tablet Take 1 tablet (20 mg total) by mouth 2 (two) times daily. 60 tablet 3   No current facility-administered medications for this visit.     Review of Systems:  GENERAL:  Feels "ok".  Fatigue.  Active.  No fevers.  Sweats.  Weight up 1 pound. PERFORMANCE STATUS (ECOG):  1 HEENT:  No visual changes, runny [name], sore  throat, mouth sores or tenderness. Lungs: No shortness of breath or cough.  No hemoptysis. Cardiac:  No chest pain, palpitations, orthopnea, or PND. GI:  Diarrhea.  No nausea, vomiting, constipation, melena or hematochezia. GU:  No urgency, frequency, dysuria, or hematuria. Musculoskeletal:  No back pain.  Arthritis in hands.  Joints stiff at times.  No muscle tenderness. Extremities:  Tendonitis in ankles.  Knee discomfort.  No swelling. Skin:  No rashes or skin changes. Neuro:  Tingle in bottom of feet.  No headache, numbness or weakness, balance or coordination issues. Endocrine:  No diabetes, thyroid issues, hot flashes or night sweats. Psych:  No mood changes, depression or anxiety. Pain:  No focal pain. Review of systems:  All other systems reviewed and found to be negative.   Physical Exam: Blood pressure 115/71, pulse 72, temperature (!) 96.7 F (35.9 C), temperature source Tympanic, resp. rate 18, weight 176 lb 7.7 oz (80 kg), SpO2 100 %. GENERAL:  Well developed, well nourished, gentleman sitting comfortably in the exam room in no acute distress. MENTAL STATUS:  Alert and oriented to person, place and time. HEAD:  Pearline Cables hair.  Normocephalic, atraumatic, face symmetric, no Cushingoid features. EYES: Glasses. Blue eyes. Pupils equal round and reactive to light and accomodation.  No conjunctivitis or scleral icterus. ENT: Oropharynx clear without lesion. Tongue normal. Mucous membranes moist.  RESPIRATORY: Clear to auscultation without rales, wheezes or rhonchi. CARDIOVASCULAR: Regular rate and rhythm without murmur, rub or gallop. ABDOMEN: Soft, non-tender, with active bowel sounds, and no hepatosplenomegaly. No masses. SKIN: No rashes, ulcers or lesions. EXTREMITIES: No edema, no skin discoloration or tenderness. No palpable cords. LYMPH NODES: No palpable cervical, supraclavicular, axillary or inguinal adenopathy  NEUROLOGICAL: Unremarkable. PSYCH: Appropriate.     No visits with results within 3 Day(s) from this visit.  Latest known visit with results is:  Appointment on 04/02/2016  Component Date Value Ref Range Status  . WBC 04/02/2016 43.7* 3.8 - 10.6 K/uL Final  . RBC 04/02/2016 6.01* 4.40 - 5.90 MIL/uL Final  . Hemoglobin 04/02/2016 10.6* 13.0 - 18.0 g/dL Final  . HCT 04/02/2016 35.7* 40.0 - 52.0 % Final  . MCV 04/02/2016 59.4* 80.0 - 100.0 fL Final  . MCH 04/02/2016 17.7* 26.0 - 34.0 pg Final  . MCHC 04/02/2016 29.7* 32.0 - 36.0 g/dL Final  . RDW 04/02/2016 25.2* 11.5 - 14.5 % Final  . Platelets 04/02/2016 580* 150 - 440 K/uL Final  . Neutrophils Relative % 04/02/2016 83  % Final  . Lymphocytes Relative 04/02/2016 4  % Final  . Monocytes Relative 04/02/2016 12  %  Final  . Eosinophils Relative 04/02/2016 1  % Final  . Basophils Relative 04/02/2016 0  % Final  . Neutro Abs 04/02/2016 36.4* 1.4 - 6.5 K/uL Final  . Lymphs Abs 04/02/2016 1.7  1.0 - 3.6 K/uL Final  . Monocytes Absolute 04/02/2016 5.2* 0.2 - 1.0 K/uL Final  . Eosinophils Absolute 04/02/2016 0.4  0 - 0.7 K/uL Final  . Basophils Absolute 04/02/2016 0.0  0 - 0.1 K/uL Final    Assessment:  Ruben [name] is a 70 y.o. 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  He has had polycythemia dating back to 2013.  Hematocrit was 62.1 with a hemoglobin of 19.8 on 03/28/2015.  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  Erythropoietin level was 1.1 (low).  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  He began a phlebotomy program on 03/28/2015 to maintain a hematocrit goal of < 45.  He last underwent phlebotomy on 05/02/2015.  He is on a baby aspirin.  He is followed closely by GI (Dr. Vira Agar) for a history of polyps and Crohn's disease.  Last colonoscopy was 3-4 years ago.  He has become iron deficient on his phlebotomy program (ferritin 21 on 11/28/2015).   CBC on 11/28/2015 revealed a hematocrit of 39.3, hemoglobin 11.9, platelets 463,000, WBC 32,000 with an ANC of 25,000.   Differential included 71% segs, 4% lymphocytes and 17% monocytes.  Peripheral smear revealed leukocytosis with predominantly mature neutrophils, increased monocytes and rare blasts (<1%).  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  Marrow was packed (95-100% cellularity) with pan myelosis, multi-lineage dyspoiesis, and no significant increase in blasts.  There was moderate to focally marked reticulin fibrosis (grade 2-3/3).  Storage iron was not identified.  Flow cytometry revealed non-specific atypical myeloid findings with no increase in blasts.  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  Cytogenetics were normal (46, XY).  Bone marrow on 04/07/2016 at [facility] revealed a hypercellular marrow (> 95%) with persistent involvement by myeloid neoplasm with 5% blasts.  There was mild reticulin fibrosis.  FISH t(9;22) results were normal.  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  Cytogenetics are pending.  He was briefly on hydroxyurea 500 mg a day (12/05/2015 - 12/19/2015).   He began allopurinol on 12/19/2015.    Symptomatically, he is fatigued.  He has sweats and arthritis.  Exam is stable.  Plan: 1.  Review interval labs and bone marrow.  Discuss mutation panel noting IDH2, RUNX1, and SRSF2 in addition to JAK2 V617F consistent with clonal evolution.  Discuss Jakafi (ruxolitinib) for symptom management while reducing risk of thrombosis and not increasing risk of acute leukemia.  Discuss initiation of Jakafi at 20 mg BID based on myelofibrosis.  Discuss following blood counts.  Discuss adding a small amount of oral iron secondary to anemia associated with medication.  2.  Follow-up bone marrow cytogenetics. 3.  Rx:  ruxolitinib 20 mg po BID. 4.  Preauth and complete  paperwork for Jakafi. 5.  RTC for MD assess, labs (CBC with diff, CMP, ferritin, Mg) when drug available + chemo teaching.   Lequita Asal, MD  05/07/2016, 1:37 PM

## 2016-05-12 ENCOUNTER — Encounter: Payer: Self-pay | Admitting: Hematology and Oncology

## 2016-05-15 ENCOUNTER — Other Ambulatory Visit: Payer: Self-pay | Admitting: *Deleted

## 2016-05-19 DIAGNOSIS — H401131 Primary open-angle glaucoma, bilateral, mild stage: Secondary | ICD-10-CM | POA: Diagnosis not present

## 2016-05-23 ENCOUNTER — Other Ambulatory Visit: Payer: Self-pay | Admitting: Hematology and Oncology

## 2016-05-23 ENCOUNTER — Encounter: Payer: Self-pay | Admitting: Hematology and Oncology

## 2016-05-23 ENCOUNTER — Inpatient Hospital Stay: Payer: Medicare Other

## 2016-05-23 ENCOUNTER — Other Ambulatory Visit: Payer: Self-pay | Admitting: *Deleted

## 2016-05-23 ENCOUNTER — Ambulatory Visit: Payer: Medicare Other

## 2016-05-23 ENCOUNTER — Inpatient Hospital Stay: Payer: Medicare Other | Attending: Hematology and Oncology | Admitting: Hematology and Oncology

## 2016-05-23 ENCOUNTER — Telehealth: Payer: Self-pay | Admitting: *Deleted

## 2016-05-23 VITALS — BP 121/69 | HR 73 | Temp 97.8°F | Resp 18 | Wt 177.8 lb

## 2016-05-23 DIAGNOSIS — K5090 Crohn's disease, unspecified, without complications: Secondary | ICD-10-CM

## 2016-05-23 DIAGNOSIS — M129 Arthropathy, unspecified: Secondary | ICD-10-CM | POA: Insufficient documentation

## 2016-05-23 DIAGNOSIS — Z807 Family history of other malignant neoplasms of lymphoid, hematopoietic and related tissues: Secondary | ICD-10-CM | POA: Diagnosis not present

## 2016-05-23 DIAGNOSIS — Z87891 Personal history of nicotine dependence: Secondary | ICD-10-CM | POA: Diagnosis not present

## 2016-05-23 DIAGNOSIS — R61 Generalized hyperhidrosis: Secondary | ICD-10-CM | POA: Insufficient documentation

## 2016-05-23 DIAGNOSIS — D45 Polycythemia vera: Secondary | ICD-10-CM | POA: Insufficient documentation

## 2016-05-23 DIAGNOSIS — D7581 Myelofibrosis: Secondary | ICD-10-CM

## 2016-05-23 DIAGNOSIS — I4510 Unspecified right bundle-branch block: Secondary | ICD-10-CM | POA: Insufficient documentation

## 2016-05-23 DIAGNOSIS — N400 Enlarged prostate without lower urinary tract symptoms: Secondary | ICD-10-CM | POA: Diagnosis not present

## 2016-05-23 DIAGNOSIS — Z7982 Long term (current) use of aspirin: Secondary | ICD-10-CM

## 2016-05-23 DIAGNOSIS — R5383 Other fatigue: Secondary | ICD-10-CM | POA: Diagnosis not present

## 2016-05-23 DIAGNOSIS — Z8042 Family history of malignant neoplasm of prostate: Secondary | ICD-10-CM | POA: Insufficient documentation

## 2016-05-23 DIAGNOSIS — Z23 Encounter for immunization: Secondary | ICD-10-CM | POA: Diagnosis not present

## 2016-05-23 DIAGNOSIS — M109 Gout, unspecified: Secondary | ICD-10-CM | POA: Insufficient documentation

## 2016-05-23 DIAGNOSIS — Z803 Family history of malignant neoplasm of breast: Secondary | ICD-10-CM | POA: Diagnosis not present

## 2016-05-23 DIAGNOSIS — Z79899 Other long term (current) drug therapy: Secondary | ICD-10-CM | POA: Diagnosis not present

## 2016-05-23 DIAGNOSIS — Z800 Family history of malignant neoplasm of digestive organs: Secondary | ICD-10-CM | POA: Diagnosis not present

## 2016-05-23 LAB — COMPREHENSIVE METABOLIC PANEL
ALT: 12 U/L — ABNORMAL LOW (ref 17–63)
AST: 31 U/L (ref 15–41)
Albumin: 3.9 g/dL (ref 3.5–5.0)
Alkaline Phosphatase: 86 U/L (ref 38–126)
Anion gap: 6 (ref 5–15)
BUN: 41 mg/dL — ABNORMAL HIGH (ref 6–20)
CO2: 23 mmol/L (ref 22–32)
Calcium: 8.7 mg/dL — ABNORMAL LOW (ref 8.9–10.3)
Chloride: 108 mmol/L (ref 101–111)
Creatinine, Ser: 1.84 mg/dL — ABNORMAL HIGH (ref 0.61–1.24)
GFR calc Af Amer: 41 mL/min — ABNORMAL LOW (ref >60)
GFR calc non Af Amer: 36 mL/min — ABNORMAL LOW (ref >60)
Glucose, Bld: 94 mg/dL (ref 65–99)
Potassium: 4.8 mmol/L (ref 3.5–5.1)
Sodium: 137 mmol/L (ref 135–145)
Total Bilirubin: 0.4 mg/dL (ref 0.3–1.2)
Total Protein: 8.3 g/dL — ABNORMAL HIGH (ref 6.5–8.1)

## 2016-05-23 LAB — LACTATE DEHYDROGENASE: LDH: 596 U/L — ABNORMAL HIGH (ref 98–192)

## 2016-05-23 LAB — CBC WITH DIFFERENTIAL/PLATELET
Band Neutrophils: 3 %
Basophils Absolute: 0 10*3/uL (ref 0–0.1)
Basophils Relative: 0 %
Eosinophils Absolute: 0 10*3/uL (ref 0–0.7)
Eosinophils Relative: 0 %
HCT: 34.5 % — ABNORMAL LOW (ref 40.0–52.0)
Hemoglobin: 10.4 g/dL — ABNORMAL LOW (ref 13.0–18.0)
Lymphocytes Relative: 8 %
Lymphs Abs: 2.5 10*3/uL (ref 1.0–3.6)
MCH: 17.8 pg — ABNORMAL LOW (ref 26.0–34.0)
MCHC: 30.1 g/dL — ABNORMAL LOW (ref 32.0–36.0)
MCV: 58.9 fL — ABNORMAL LOW (ref 80.0–100.0)
Monocytes Absolute: 5.6 10*3/uL — ABNORMAL HIGH (ref 0.2–1.0)
Monocytes Relative: 18 %
Neutro Abs: 23.1 10*3/uL — ABNORMAL HIGH (ref 1.4–6.5)
Neutrophils Relative %: 71 %
Platelets: 326 10*3/uL (ref 150–440)
RBC: 5.86 MIL/uL (ref 4.40–5.90)
RDW: 27.7 % — ABNORMAL HIGH (ref 11.5–14.5)
WBC: 31.2 10*3/uL — ABNORMAL HIGH (ref 3.8–10.6)

## 2016-05-23 LAB — LIPID PANEL
Cholesterol: 78 mg/dL (ref 0–200)
HDL: 26 mg/dL — ABNORMAL LOW (ref >40)
LDL Cholesterol: 18 mg/dL (ref 0–99)
Total CHOL/HDL Ratio: 3 RATIO
Triglycerides: 168 mg/dL — ABNORMAL HIGH (ref <150)
VLDL: 34 mg/dL (ref 0–40)

## 2016-05-23 LAB — FERRITIN: Ferritin: 22 ng/mL — ABNORMAL LOW (ref 24–336)

## 2016-05-23 LAB — MAGNESIUM: Magnesium: 2.4 mg/dL (ref 1.7–2.4)

## 2016-05-23 LAB — URIC ACID: Uric Acid, Serum: 6.3 mg/dL (ref 4.4–7.6)

## 2016-05-23 NOTE — Telephone Encounter (Signed)
Called pt and let him know that creat elevated 1.8 today and he is usually elevated some and that he should increase fluids 2-3 glasses of water, juice, or liquids that do not have caffeine in it.  He said he has slacked off and he can inc. Is fluid intake.

## 2016-05-23 NOTE — Progress Notes (Signed)
[place] [facility] day:  05/23/2016  Chief Complaint: Ruben [name] is a 70 y.o. 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  HPI: The patient  was last seen in the medical oncology clinic on 05/07/2016.  At that time, he was fatigued.  He had sweats and arthritis.  Exam was stable.  We discussed initiation of Jakafi (ruxolitinib) for symptom management while reducing risk of thrombosis and not increasing risk of acute leukemia.  During the interim, he has felt the same.  He denies any new complaints.  Past Medical History:  Diagnosis Date  . BPH (benign prostatic hyperplasia)   . Crohn disease ([place])   . Crohn's disease ([place])   . Glaucoma   . Glaucoma   . Gout   . History of chicken pox   . Right bundle branch block     Past Surgical History:  Procedure Laterality Date  . PROSTATE SURGERY  2008   Prostate Biopsy in [place] to elevated PSA.  per patient normal  . Skin Lesion Basal cell removed    . wart removal     from eyelid    Family History  Problem Relation Age of Onset  . Cancer Mother     Liver  . Cancer Father     Prostate  . Cancer Sister     Hodgkins lymphoma  . Cancer Paternal Aunt     Breast    Social History:  reports that he quit smoking about 12 years ago. His smoking use included Cigarettes. He has a 5.00 pack-year smoking history. He does not have any smokeless tobacco history on file. He reports that he drinks about 1.2 oz of alcohol per week . He reports that he does not use drugs.  He previously worked for [employer].  He volunteers for [employer].  The patient is accompanied by his wife, Ruben [name], today.  Allergies: No Known Allergies  Current Medications: Current Outpatient Prescriptions  Medication Sig Dispense Refill  . allopurinol (ZYLOPRIM) 300 MG tablet TAKE 1 TABLET (300 MG TOTAL) BY MOUTH DAILY. 30 tablet 3  . aspirin 81 MG tablet Take 81 mg by mouth  daily.    . Cholecalciferol 1000 units tablet Take 1,000 Units by mouth daily. Reported on 12/19/2015    . Cyanocobalamin (RA VITAMIN B-12 TR) 1000 MCG TBCR     . folic acid (FOLVITE) 1 MG tablet TAKE 1 TABLET (1,000 MCG TOTAL) BY MOUTH ONCE DAILY. MUST KEEP APPT FOR FURTHER REFILLS.    Marland Kitchen latanoprost (XALATAN) 0.005 % ophthalmic solution Apply 1 drop to eye daily.     . Multiple Vitamins-Minerals (CENTRUM SILVER ULTRA MENS) TABS Take by mouth. Reported on 01/30/2016    . Omega-3 Fatty Acids (KP FISH OIL) 1200 MG CAPS Take by mouth.    . timolol (BETIMOL) 0.25 % ophthalmic solution Place 1 drop into both eyes daily.     . ruxolitinib phosphate (JAKAFI) 20 MG tablet Take 1 tablet (20 mg total) by mouth 2 (two) times daily. (Patient not taking: Reported on 05/23/2016) 60 tablet 3   No current facility-administered medications for this visit.     Review of Systems:  GENERAL:  Feels "the same".  Fatigue.  No fevers.  Sweats.  Weight up 1 pound. PERFORMANCE STATUS (ECOG):  1 HEENT:  No visual changes, runny [name], sore throat, mouth sores or tenderness. Lungs: No shortness of breath or cough.  No hemoptysis.  Cardiac:  No chest pain, palpitations, orthopnea, or PND. GI:  Diarrhea (no change).  No nausea, vomiting, constipation, melena or hematochezia. GU:  No urgency, frequency, dysuria, or hematuria. Musculoskeletal:  No back pain.  Arthritis in hands.  Joints stiff at times.  No muscle tenderness. Extremities:  Tendonitis in ankles.  Knee discomfort.  No swelling. Skin:  No rashes or skin changes. Neuro:  Tingle in bottom of feet.  No headache, numbness or weakness, balance or coordination issues. Endocrine:  No diabetes, thyroid issues, hot flashes or night sweats. Psych:  No mood changes, depression or anxiety. Pain:  No focal pain. Review of systems:  All other systems reviewed and found to be negative.   Physical Exam: Blood pressure 121/69, pulse 73, temperature 97.8 F (36.6 C),  temperature source Tympanic, resp. rate 18, weight 177 lb 12.8 oz (80.7 kg), SpO2 100 %. GENERAL:  Well developed, well nourished, gentleman sitting comfortably in the exam room in no acute distress. MENTAL STATUS:  Alert and oriented to person, place and time. HEAD:  Pearline Cables hair.  Normocephalic, atraumatic, face symmetric, no Cushingoid features. EYES: Glasses. Blue eyes. Pupils equal round and reactive to light and accomodation.  No conjunctivitis or scleral icterus. ENT: Oropharynx clear without lesion. Tongue normal. Mucous membranes moist.  RESPIRATORY: Clear to auscultation without rales, wheezes or rhonchi. CARDIOVASCULAR: Regular rate and rhythm without murmur, rub or gallop. ABDOMEN: Soft, non-tender, with active bowel sounds, and no hepatomegaly. Spleen palpable 1-2 fingerbreaths below left costal margin.  No masses. SKIN: No rashes, ulcers or lesions. EXTREMITIES: No edema, no skin discoloration or tenderness. No palpable cords. LYMPH NODES: No palpable cervical, supraclavicular, axillary or inguinal adenopathy  NEUROLOGICAL: Unremarkable. PSYCH: Appropriate.    No visits with results within 3 Day(s) from this visit.  Latest known visit with results is:  Appointment on 04/02/2016  Component Date Value Ref Range Status  . WBC 04/02/2016 43.7* 3.8 - 10.6 K/uL Final  . RBC 04/02/2016 6.01* 4.40 - 5.90 MIL/uL Final  . Hemoglobin 04/02/2016 10.6* 13.0 - 18.0 g/dL Final  . HCT 04/02/2016 35.7* 40.0 - 52.0 % Final  . MCV 04/02/2016 59.4* 80.0 - 100.0 fL Final  . MCH 04/02/2016 17.7* 26.0 - 34.0 pg Final  . MCHC 04/02/2016 29.7* 32.0 - 36.0 g/dL Final  . RDW 04/02/2016 25.2* 11.5 - 14.5 % Final  . Platelets 04/02/2016 580* 150 - 440 K/uL Final  . Neutrophils Relative % 04/02/2016 83  % Final  . Lymphocytes Relative 04/02/2016 4  % Final  . Monocytes Relative 04/02/2016 12  % Final  . Eosinophils Relative 04/02/2016 1  % Final  . Basophils Relative 04/02/2016 0  % Final   . Neutro Abs 04/02/2016 36.4* 1.4 - 6.5 K/uL Final  . Lymphs Abs 04/02/2016 1.7  1.0 - 3.6 K/uL Final  . Monocytes Absolute 04/02/2016 5.2* 0.2 - 1.0 K/uL Final  . Eosinophils Absolute 04/02/2016 0.4  0 - 0.7 K/uL Final  . Basophils Absolute 04/02/2016 0.0  0 - 0.1 K/uL Final    Assessment:  Ruben [name] is a 70 y.o. 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  He has had polycythemia dating back to 2013.  Hematocrit was 62.1 with a hemoglobin of 19.8 on 03/28/2015.  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  Erythropoietin level was 1.1 (low).  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  He began a phlebotomy program on 03/28/2015 to maintain a hematocrit goal of < 45.  He last underwent phlebotomy  on 05/02/2015.  He is on a baby aspirin.  He is followed closely by GI (Dr. Vira Agar) for a history of polyps and Crohn's disease.  Last colonoscopy was 3-4 years ago.  He has become iron deficient on his phlebotomy program (ferritin 21 on 11/28/2015).   CBC on 11/28/2015 revealed a hematocrit of 39.3, hemoglobin 11.9, platelets 463,000, WBC 32,000 with an ANC of 25,000.  Differential included 71% segs, 4% lymphocytes and 17% monocytes.  Peripheral smear revealed leukocytosis with predominantly mature neutrophils, increased monocytes and rare blasts (<1%).  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  Marrow was packed (95-100% cellularity) with pan myelosis, multi-lineage dyspoiesis, and no significant increase in blasts.  There was moderate to focally marked reticulin fibrosis (grade 2-3/3).  Storage iron was not identified.  Flow cytometry revealed non-specific atypical myeloid findings with no increase in blasts.  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  Cytogenetics were normal (46, XY).  Bone marrow on  04/07/2016 at [facility] revealed a hypercellular marrow (> 95%) with persistent involvement by myeloid neoplasm with 5% blasts.  There was mild reticulin fibrosis.  FISH t(9;22) results were normal.  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  Cytogenetics are pending.  He was briefly on hydroxyurea 500 mg a day (12/05/2015 - 12/19/2015).   He began allopurinol on 12/19/2015.    Symptomatically, he remains fatigued.  He has sweats and arthritis.  Exam is stable.  Plan: 1.  Labs today:  CBC with diff, CMP, ferritin, Mg, LDH, hepatitis B core antibody, hepatitis B surface atigen. 2.  Review treatment plan.  Discuss close monitoring of counts (every 2 weeks).  Recheck lipid panel 8-12 weeks after initiation of therapy.  Discuss annual dermatologic check for non-melanoma skin cancer. 3.  Begin ruxolitinib 20 mg po BID when available.  Patient to call company for delivery. 4.  Begin ferrous sulfate 383m po three times a week (after starting ruxolitinib). 5.  [place] and labs (CBC with diff, CMP, LDH).   MLequita Asal MD  05/23/2016 , 11:52 AM

## 2016-05-23 NOTE — Telephone Encounter (Signed)
-----   Message from Lequita Asal, MD sent at 05/23/2016  1:16 PM EDT ----- Regarding: call patient  Increase fluids.  M ----- Message ----- From: Interface, Lab In [place] Sent: 05/23/2016   1:08 PM To: Lequita Asal, MD

## 2016-05-23 NOTE — Progress Notes (Signed)
Pt here today and we felt that pt had got his Jakafi and he has been approved for copay asst but pt does not have med. I have went over the drug jakafi and he will take 20 mg tablets and take 1 tablet twice a day.  Went over side effects of anemia, thrombocytopenia, neutropenia, diarrhea , increase liver test, inc. Cholesterol, dizziness, bruising.  Pt should avoid grapefruit, grapefruit juice, any supplements that contain grapefruit. Pt should take medication whole with or without food.  [facility] and the shipment of drug will deliver 9/6 and pt will begin taking oral med 9/7. Pt states that I have answered all his questions and feels good about starting med next week

## 2016-05-24 LAB — HEPATITIS B CORE ANTIBODY, TOTAL: Hep B Core Total Ab: NEGATIVE

## 2016-05-24 LAB — HEPATITIS B SURFACE ANTIGEN: Hepatitis B Surface Ag: NEGATIVE

## 2016-05-25 ENCOUNTER — Encounter: Payer: Self-pay | Admitting: Hematology and Oncology

## 2016-05-28 ENCOUNTER — Encounter: Payer: Self-pay | Admitting: Hematology and Oncology

## 2016-05-29 DIAGNOSIS — H401131 Primary open-angle glaucoma, bilateral, mild stage: Secondary | ICD-10-CM | POA: Diagnosis not present

## 2016-06-11 ENCOUNTER — Encounter: Payer: Self-pay | Admitting: Hematology and Oncology

## 2016-06-11 ENCOUNTER — Other Ambulatory Visit: Payer: Self-pay | Admitting: *Deleted

## 2016-06-11 ENCOUNTER — Inpatient Hospital Stay (HOSPITAL_BASED_OUTPATIENT_CLINIC_OR_DEPARTMENT_OTHER): Payer: Medicare Other | Admitting: Hematology and Oncology

## 2016-06-11 ENCOUNTER — Inpatient Hospital Stay: Payer: Medicare Other

## 2016-06-11 VITALS — BP 128/75 | HR 58 | Temp 97.2°F | Resp 18 | Wt 175.0 lb

## 2016-06-11 DIAGNOSIS — D45 Polycythemia vera: Secondary | ICD-10-CM | POA: Diagnosis not present

## 2016-06-11 DIAGNOSIS — Z79899 Other long term (current) drug therapy: Secondary | ICD-10-CM

## 2016-06-11 DIAGNOSIS — M129 Arthropathy, unspecified: Secondary | ICD-10-CM

## 2016-06-11 DIAGNOSIS — R61 Generalized hyperhidrosis: Secondary | ICD-10-CM

## 2016-06-11 DIAGNOSIS — Z807 Family history of other malignant neoplasms of lymphoid, hematopoietic and related tissues: Secondary | ICD-10-CM

## 2016-06-11 DIAGNOSIS — D7581 Myelofibrosis: Secondary | ICD-10-CM

## 2016-06-11 DIAGNOSIS — N400 Enlarged prostate without lower urinary tract symptoms: Secondary | ICD-10-CM

## 2016-06-11 DIAGNOSIS — R5383 Other fatigue: Secondary | ICD-10-CM | POA: Diagnosis not present

## 2016-06-11 DIAGNOSIS — K5090 Crohn's disease, unspecified, without complications: Secondary | ICD-10-CM

## 2016-06-11 DIAGNOSIS — I4510 Unspecified right bundle-branch block: Secondary | ICD-10-CM

## 2016-06-11 DIAGNOSIS — M109 Gout, unspecified: Secondary | ICD-10-CM

## 2016-06-11 DIAGNOSIS — Z800 Family history of malignant neoplasm of digestive organs: Secondary | ICD-10-CM

## 2016-06-11 DIAGNOSIS — R972 Elevated prostate specific antigen [PSA]: Secondary | ICD-10-CM

## 2016-06-11 DIAGNOSIS — Z7982 Long term (current) use of aspirin: Secondary | ICD-10-CM

## 2016-06-11 DIAGNOSIS — Z8042 Family history of malignant neoplasm of prostate: Secondary | ICD-10-CM

## 2016-06-11 DIAGNOSIS — Z87891 Personal history of nicotine dependence: Secondary | ICD-10-CM

## 2016-06-11 DIAGNOSIS — Z803 Family history of malignant neoplasm of breast: Secondary | ICD-10-CM

## 2016-06-11 LAB — COMPREHENSIVE METABOLIC PANEL
ALT: 21 U/L (ref 17–63)
AST: 31 U/L (ref 15–41)
Albumin: 4.2 g/dL (ref 3.5–5.0)
Alkaline Phosphatase: 69 U/L (ref 38–126)
Anion gap: 8 (ref 5–15)
BUN: 45 mg/dL — ABNORMAL HIGH (ref 6–20)
CO2: 22 mmol/L (ref 22–32)
Calcium: 9.1 mg/dL (ref 8.9–10.3)
Chloride: 106 mmol/L (ref 101–111)
Creatinine, Ser: 1.77 mg/dL — ABNORMAL HIGH (ref 0.61–1.24)
GFR calc Af Amer: 43 mL/min — ABNORMAL LOW (ref >60)
GFR calc non Af Amer: 37 mL/min — ABNORMAL LOW (ref >60)
Glucose, Bld: 111 mg/dL — ABNORMAL HIGH (ref 65–99)
Potassium: 4.8 mmol/L (ref 3.5–5.1)
Sodium: 136 mmol/L (ref 135–145)
Total Bilirubin: 0.6 mg/dL (ref 0.3–1.2)
Total Protein: 7.8 g/dL (ref 6.5–8.1)

## 2016-06-11 LAB — CBC WITH DIFFERENTIAL/PLATELET
Basophils Absolute: 0.1 10*3/uL (ref 0–0.1)
Basophils Relative: 1 %
Eosinophils Absolute: 0.2 10*3/uL (ref 0–0.7)
Eosinophils Relative: 1 %
HCT: 34.8 % — ABNORMAL LOW (ref 40.0–52.0)
Hemoglobin: 10.5 g/dL — ABNORMAL LOW (ref 13.0–18.0)
Lymphocytes Relative: 10 %
Lymphs Abs: 2.1 10*3/uL (ref 1.0–3.6)
MCH: 18.1 pg — ABNORMAL LOW (ref 26.0–34.0)
MCHC: 30.3 g/dL — ABNORMAL LOW (ref 32.0–36.0)
MCV: 59.8 fL — ABNORMAL LOW (ref 80.0–100.0)
Monocytes Absolute: 3.6 10*3/uL — ABNORMAL HIGH (ref 0.2–1.0)
Monocytes Relative: 17 %
Neutro Abs: 15.9 10*3/uL — ABNORMAL HIGH (ref 1.4–6.5)
Neutrophils Relative %: 73 %
Platelets: 415 10*3/uL (ref 150–440)
RBC: 5.82 MIL/uL (ref 4.40–5.90)
RDW: 26.7 % — ABNORMAL HIGH (ref 11.5–14.5)
WBC: 21.9 10*3/uL — ABNORMAL HIGH (ref 3.8–10.6)

## 2016-06-11 NOTE — Progress Notes (Signed)
Patient states he is doing well on his Jakafi.  Offers no complaints today.

## 2016-06-11 NOTE — Progress Notes (Signed)
[name] day:  06/11/2016  Chief Complaint: Ruben [name] is a 70 y.o. 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  HPI: The patient  was last seen in the medical oncology clinic on 05/23/2016.  At that time, he remains fatigued.  He had sweats and arthritis.  Exam was stable.  We discussed initiation of Jakafi.  He was to call for delivery.  CBC on 05/23/2016 revealed a hematocrit of 34.5, hemoglobin 10.4, MCV 58.9, platelets 326,000, WBC 31,200 with an ANC of 23,100.  Creatinine was 1.84 (CrCl 36 ml/min).  Hepatitis surface antigen and hepatitis B core antibody total were negative.  Ferritin was 22.  Magnesium was 2.4.  Triglycerides were 168 (<150).  LDH was 596.  Uric acid was 6.3.  He states that he began [place] on 05/28/2016.  He is taking iron Mondays, Wednesdays, and Fridays.  He notes "no big issues". He feels that his knees and ankle pain are better.  He denies any fevers or sweats.  He denies any bruising or bleeding.   Past Medical History:  Diagnosis Date  . BPH (benign prostatic hyperplasia)   . Crohn disease ([place])   . Crohn's disease ([place])   . Glaucoma   . Glaucoma   . Gout   . History of chicken pox   . Right bundle branch block     Past Surgical History:  Procedure Laterality Date  . PROSTATE SURGERY  2008   Prostate Biopsy in [place] to elevated PSA.  per patient normal  . Skin Lesion Basal cell removed    . wart removal     from eyelid    Family History  Problem Relation Age of Onset  . Cancer Mother     Liver  . Cancer Father     Prostate  . Cancer Sister     Hodgkins lymphoma  . Cancer Paternal Aunt     Breast    Social History:  reports that he quit smoking about 12 years ago. His smoking use included Cigarettes. He has a 5.00 pack-year smoking history. He does not have any smokeless tobacco history on file. He reports that he drinks  about 1.2 oz of alcohol per week . He reports that he does not use drugs.  He previously worked for [employer].  He volunteers for [employer].  The patient is accompanied by his wife, Ruben [name], today.  Allergies: No Known Allergies  Current Medications: Current Outpatient Prescriptions  Medication Sig Dispense Refill  . allopurinol (ZYLOPRIM) 300 MG tablet TAKE 1 TABLET (300 MG TOTAL) BY MOUTH DAILY. 30 tablet 3  . aspirin 81 MG tablet Take 81 mg by mouth daily.    . Cholecalciferol 1000 units tablet Take 1,000 Units by mouth daily. Reported on 12/19/2015    . Cyanocobalamin (RA VITAMIN B-12 TR) 1000 MCG TBCR     . folic acid (FOLVITE) 1 MG tablet TAKE 1 TABLET (1,000 MCG TOTAL) BY MOUTH ONCE DAILY. MUST KEEP APPT FOR FURTHER REFILLS.    Marland Kitchen latanoprost (XALATAN) 0.005 % ophthalmic solution Apply 1 drop to eye daily.     . Multiple Vitamins-Minerals (CENTRUM SILVER ULTRA MENS) TABS Take by mouth. Reported on 01/30/2016    . Omega-3 Fatty Acids (KP FISH OIL) 1200 MG CAPS Take by mouth.    . ruxolitinib phosphate (JAKAFI) 20 MG tablet Take 1 tablet (20 mg total) by mouth 2 (two)  times daily. 60 tablet 3  . timolol (BETIMOL) 0.25 % ophthalmic solution Place 1 drop into both eyes daily.      No current facility-administered medications for this visit.     Review of Systems:  GENERAL:  Feels better.  No fevers.  Sweats.  Weight down 2 pounds. PERFORMANCE STATUS (ECOG):  1 HEENT:  No visual changes, runny [name], sore throat, mouth sores or tenderness. Lungs: No shortness of breath or cough.  No hemoptysis. Cardiac:  No chest pain, palpitations, orthopnea, or PND. GI:  Diarrhea (no change).  No nausea, vomiting, constipation, melena or hematochezia. GU:  No urgency, frequency, dysuria, or hematuria. Musculoskeletal:  No back pain.  Knee and ankle better.  No muscle tenderness. Extremities:  Tendonitis in ankles.  Knee pain better.  No swelling. Skin:  No rashes or skin changes. Neuro:   Tingle in bottom of feet.  No headache, numbness or weakness, balance or coordination issues. Endocrine:  No diabetes, thyroid issues, hot flashes or night sweats. Psych:  No mood changes, depression or anxiety. Pain:  Joint pain, better. Review of systems:  All other systems reviewed and found to be negative.   Physical Exam: Blood pressure 128/75, pulse (!) 58, temperature 97.2 F (36.2 C), temperature source Tympanic, resp. rate 18, weight 175 lb 0.7 oz (79.4 kg). GENERAL:  Well developed, well nourished, gentleman sitting comfortably in the exam room in no acute distress. MENTAL STATUS:  Alert and oriented to person, place and time. HEAD:  Pearline Cables hair.  Normocephalic, atraumatic, face symmetric, no Cushingoid features. EYES: Glasses. Blue eyes. Pupils equal round and reactive to light and accomodation.  No conjunctivitis or scleral icterus. ENT: Oropharynx clear without lesion. Tongue normal. Mucous membranes moist.  RESPIRATORY: Clear to auscultation without rales, wheezes or rhonchi. CARDIOVASCULAR: Regular rate and rhythm without murmur, rub or gallop. ABDOMEN: Soft, non-tender, with active bowel sounds, and no hepatomegaly. Spleen palpable 1-2 fingerbreaths below left costal margin.  No masses.  Suprapubic fullness/bladder. SKIN: No rashes, ulcers or lesions. EXTREMITIES: No edema, no skin discoloration or tenderness. No palpable cords. LYMPH NODES: No palpable cervical, supraclavicular, axillary or inguinal adenopathy  NEUROLOGICAL: Unremarkable. PSYCH: Appropriate.    Appointment on 06/11/2016  Component Date Value Ref Range Status  . Sodium 06/11/2016 136  135 - 145 mmol/L Final  . Potassium 06/11/2016 4.8  3.5 - 5.1 mmol/L Final  . Chloride 06/11/2016 106  101 - 111 mmol/L Final  . CO2 06/11/2016 22  22 - 32 mmol/L Final  . Glucose, Bld 06/11/2016 111* 65 - 99 mg/dL Final  . BUN 06/11/2016 45* 6 - 20 mg/dL Final  . Creatinine, Ser 06/11/2016 1.77* 0.61 - 1.24  mg/dL Final  . Calcium 06/11/2016 9.1  8.9 - 10.3 mg/dL Final  . Total Protein 06/11/2016 7.8  6.5 - 8.1 g/dL Final  . Albumin 06/11/2016 4.2  3.5 - 5.0 g/dL Final  . AST 06/11/2016 31  15 - 41 U/L Final  . ALT 06/11/2016 21  17 - 63 U/L Final  . Alkaline Phosphatase 06/11/2016 69  38 - 126 U/L Final  . Total Bilirubin 06/11/2016 0.6  0.3 - 1.2 mg/dL Final  . GFR calc non Af Amer 06/11/2016 37* >60 mL/min Final  . GFR calc Af Amer 06/11/2016 43* >60 mL/min Final   Comment: (NOTE) The eGFR has been calculated using the CKD EPI equation. This calculation has not been validated in all clinical situations. eGFR's persistently <60 mL/min signify possible Chronic Kidney Disease.   Marland Kitchen  Anion gap 06/11/2016 8  5 - 15 Final    Assessment:  Ruben [name] is a 70 y.o. 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  He has had polycythemia dating back to 2013.  Hematocrit was 62.1 with a hemoglobin of 19.8 on 03/28/2015.  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  Erythropoietin level was 1.1 (low).  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  He began a phlebotomy program on 03/28/2015 to maintain a hematocrit goal of < 45.  He last underwent phlebotomy on 05/02/2015.  He is on a baby aspirin.  He is followed closely by GI (Dr. Vira Agar) for a history of polyps and Crohn's disease.  Last colonoscopy was 3-4 years ago.  He has become iron deficient on his phlebotomy program (ferritin 21 on 11/28/2015).   CBC on 11/28/2015 revealed a hematocrit of 39.3, hemoglobin 11.9, platelets 463,000, WBC 32,000 with an ANC of 25,000.  Differential included 71% segs, 4% lymphocytes and 17% monocytes.  Peripheral smear revealed leukocytosis with predominantly mature neutrophils, increased monocytes and rare blasts (<1%).  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  Marrow was packed (95-100%  cellularity) with pan myelosis, multi-lineage dyspoiesis, and no significant increase in blasts.  There was moderate to focally marked reticulin fibrosis (grade 2-3/3).  Storage iron was not identified.  Flow cytometry revealed non-specific atypical myeloid findings with no increase in blasts.  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  Cytogenetics were normal (46, XY).  B[facility] marrow on 04/07/2016 at [facility] revealed a hypercellular marrow (> 95%) with persistent involvement by myeloid neoplasm with 5% blasts.  There was mild reticulin fibrosis.  FISH t(9;22) results were normal.  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  Cytogenetics are pending.  He was briefly on hydroxyurea 500 mg a day (12/05/2015 - 12/19/2015).   He began allopurinol on 12/19/2015.  He began [place] on 05/28/2016.  Symptomatically, he feels better.  Arthritis pain has diminished.  Exam is stable.  Plan: 1.  Labs today:  CBC with diff, CMP,LDH. 2.  Consult urology- prostate enlargement; unable to empty bladder completely. 3.  Continue Jakafi 20 mg BID. 4.  Continue ferrous sulfate 325 mg po q Mondays, Wednesday, Fridays. 5.  RTC in 2 weeks for labs (CBC with diff, BMP, PSA) 6.  RTC in 4 weeks for MD assess, labs (CBC with diff, CMP, uric acid, LDH).   Lequita Asal, MD  06/11/2016 , 12:16 PM

## 2016-06-15 ENCOUNTER — Encounter: Payer: Self-pay | Admitting: Hematology and Oncology

## 2016-06-25 ENCOUNTER — Inpatient Hospital Stay: Payer: Medicare Other | Attending: Hematology and Oncology

## 2016-06-25 ENCOUNTER — Other Ambulatory Visit: Payer: Self-pay | Admitting: Hematology and Oncology

## 2016-06-25 ENCOUNTER — Telehealth: Payer: Self-pay | Admitting: *Deleted

## 2016-06-25 ENCOUNTER — Other Ambulatory Visit: Payer: Self-pay | Admitting: *Deleted

## 2016-06-25 DIAGNOSIS — I4510 Unspecified right bundle-branch block: Secondary | ICD-10-CM | POA: Diagnosis not present

## 2016-06-25 DIAGNOSIS — R972 Elevated prostate specific antigen [PSA]: Secondary | ICD-10-CM

## 2016-06-25 DIAGNOSIS — Z79899 Other long term (current) drug therapy: Secondary | ICD-10-CM | POA: Insufficient documentation

## 2016-06-25 DIAGNOSIS — Z800 Family history of malignant neoplasm of digestive organs: Secondary | ICD-10-CM | POA: Insufficient documentation

## 2016-06-25 DIAGNOSIS — Z807 Family history of other malignant neoplasms of lymphoid, hematopoietic and related tissues: Secondary | ICD-10-CM | POA: Insufficient documentation

## 2016-06-25 DIAGNOSIS — Z86010 Personal history of colonic polyps: Secondary | ICD-10-CM | POA: Insufficient documentation

## 2016-06-25 DIAGNOSIS — Z87891 Personal history of nicotine dependence: Secondary | ICD-10-CM | POA: Diagnosis not present

## 2016-06-25 DIAGNOSIS — D45 Polycythemia vera: Secondary | ICD-10-CM | POA: Diagnosis not present

## 2016-06-25 DIAGNOSIS — K5090 Crohn's disease, unspecified, without complications: Secondary | ICD-10-CM | POA: Diagnosis not present

## 2016-06-25 DIAGNOSIS — Z803 Family history of malignant neoplasm of breast: Secondary | ICD-10-CM | POA: Diagnosis not present

## 2016-06-25 DIAGNOSIS — Z7982 Long term (current) use of aspirin: Secondary | ICD-10-CM | POA: Diagnosis not present

## 2016-06-25 DIAGNOSIS — Z8042 Family history of malignant neoplasm of prostate: Secondary | ICD-10-CM | POA: Diagnosis not present

## 2016-06-25 DIAGNOSIS — N400 Enlarged prostate without lower urinary tract symptoms: Secondary | ICD-10-CM | POA: Diagnosis not present

## 2016-06-25 DIAGNOSIS — M1990 Unspecified osteoarthritis, unspecified site: Secondary | ICD-10-CM | POA: Diagnosis not present

## 2016-06-25 DIAGNOSIS — M129 Arthropathy, unspecified: Secondary | ICD-10-CM | POA: Insufficient documentation

## 2016-06-25 DIAGNOSIS — M109 Gout, unspecified: Secondary | ICD-10-CM | POA: Diagnosis not present

## 2016-06-25 DIAGNOSIS — D7581 Myelofibrosis: Secondary | ICD-10-CM

## 2016-06-25 LAB — CBC WITH DIFFERENTIAL/PLATELET
Basophils Absolute: 0.5 10*3/uL — ABNORMAL HIGH (ref 0–0.1)
Basophils Relative: 1 %
Eosinophils Absolute: 0 10*3/uL (ref 0–0.7)
Eosinophils Relative: 0 %
HCT: 33.1 % — ABNORMAL LOW (ref 40.0–52.0)
Hemoglobin: 9.8 g/dL — ABNORMAL LOW (ref 13.0–18.0)
Lymphocytes Relative: 4 %
Lymphs Abs: 2.2 10*3/uL (ref 1.0–3.6)
MCH: 18.3 pg — ABNORMAL LOW (ref 26.0–34.0)
MCHC: 29.6 g/dL — ABNORMAL LOW (ref 32.0–36.0)
MCV: 61.7 fL — ABNORMAL LOW (ref 80.0–100.0)
Monocytes Absolute: 4.9 10*3/uL — ABNORMAL HIGH (ref 0.2–1.0)
Monocytes Relative: 9 %
Neutro Abs: 47.2 10*3/uL — ABNORMAL HIGH (ref 1.4–6.5)
Neutrophils Relative %: 86 %
Platelets: 1183 10*3/uL (ref 150–440)
RBC: 5.36 MIL/uL (ref 4.40–5.90)
RDW: 28.5 % — ABNORMAL HIGH (ref 11.5–14.5)
WBC: 54.8 10*3/uL (ref 3.8–10.6)

## 2016-06-25 LAB — BASIC METABOLIC PANEL
Anion gap: 7 (ref 5–15)
BUN: 40 mg/dL — ABNORMAL HIGH (ref 6–20)
CO2: 23 mmol/L (ref 22–32)
Calcium: 9.2 mg/dL (ref 8.9–10.3)
Chloride: 106 mmol/L (ref 101–111)
Creatinine, Ser: 1.94 mg/dL — ABNORMAL HIGH (ref 0.61–1.24)
GFR calc Af Amer: 39 mL/min — ABNORMAL LOW (ref >60)
GFR calc non Af Amer: 34 mL/min — ABNORMAL LOW (ref >60)
Glucose, Bld: 98 mg/dL (ref 65–99)
Potassium: 4.9 mmol/L (ref 3.5–5.1)
Sodium: 136 mmol/L (ref 135–145)

## 2016-06-25 NOTE — Telephone Encounter (Signed)
Called patient to inform him that his platelets are elevated.  MD recommends he go back on his hydroxurea - one tablet daily.  He is also to rtc next Wed. October 11 to have CBC.  Patient verbalized understanding.

## 2016-06-29 LAB — PATHOLOGIST SMEAR REVIEW

## 2016-07-02 ENCOUNTER — Telehealth: Payer: Self-pay | Admitting: *Deleted

## 2016-07-02 ENCOUNTER — Inpatient Hospital Stay: Payer: Medicare Other

## 2016-07-02 DIAGNOSIS — N400 Enlarged prostate without lower urinary tract symptoms: Secondary | ICD-10-CM | POA: Diagnosis not present

## 2016-07-02 DIAGNOSIS — D45 Polycythemia vera: Secondary | ICD-10-CM

## 2016-07-02 DIAGNOSIS — I4510 Unspecified right bundle-branch block: Secondary | ICD-10-CM | POA: Diagnosis not present

## 2016-07-02 DIAGNOSIS — K5090 Crohn's disease, unspecified, without complications: Secondary | ICD-10-CM | POA: Diagnosis not present

## 2016-07-02 DIAGNOSIS — M109 Gout, unspecified: Secondary | ICD-10-CM | POA: Diagnosis not present

## 2016-07-02 DIAGNOSIS — M129 Arthropathy, unspecified: Secondary | ICD-10-CM | POA: Diagnosis not present

## 2016-07-02 LAB — CBC WITH DIFFERENTIAL/PLATELET
Band Neutrophils: 7 %
Basophils Absolute: 0.3 10*3/uL — ABNORMAL HIGH (ref 0–0.1)
Basophils Relative: 1 %
Blasts: 0 %
Eosinophils Absolute: 0 10*3/uL (ref 0–0.7)
Eosinophils Relative: 0 %
HCT: 31.7 % — ABNORMAL LOW (ref 40.0–52.0)
Hemoglobin: 9.8 g/dL — ABNORMAL LOW (ref 13.0–18.0)
Lymphocytes Relative: 7 %
Lymphs Abs: 2.1 10*3/uL (ref 1.0–3.6)
MCH: 19.2 pg — ABNORMAL LOW (ref 26.0–34.0)
MCHC: 30.8 g/dL — ABNORMAL LOW (ref 32.0–36.0)
MCV: 62.2 fL — ABNORMAL LOW (ref 80.0–100.0)
Metamyelocytes Relative: 0 %
Monocytes Absolute: 2.1 10*3/uL — ABNORMAL HIGH (ref 0.2–1.0)
Monocytes Relative: 7 %
Myelocytes: 1 %
Neutro Abs: 26.1 10*3/uL — ABNORMAL HIGH (ref 1.4–6.5)
Neutrophils Relative %: 77 %
Other: 0 %
Platelets: 429 10*3/uL (ref 150–440)
Promyelocytes Absolute: 0 %
RBC: 5.1 MIL/uL (ref 4.40–5.90)
RDW: 30.4 % — ABNORMAL HIGH (ref 11.5–14.5)
WBC: 30.6 10*3/uL — ABNORMAL HIGH (ref 3.8–10.6)
nRBC: 2 /100 WBC — ABNORMAL HIGH

## 2016-07-02 NOTE — Telephone Encounter (Signed)
Called patient to inform him his counts are much improved.  MD wants to know how many 500 mg Hydrea he has taken?  Instructed patient to call back to [place] tomorrow and let us know.  Repeat labs in one week.

## 2016-07-04 ENCOUNTER — Telehealth: Payer: Self-pay | Admitting: *Deleted

## 2016-07-04 NOTE — Telephone Encounter (Signed)
Called to say that hydrea once a day pulled plt down to 429 from 1 million and it went down that much she would like him to hold the hydrea until he comes back next week and we check his values again.  She will tell pt

## 2016-07-09 ENCOUNTER — Inpatient Hospital Stay (HOSPITAL_BASED_OUTPATIENT_CLINIC_OR_DEPARTMENT_OTHER): Payer: Medicare Other | Admitting: Hematology and Oncology

## 2016-07-09 ENCOUNTER — Inpatient Hospital Stay: Payer: Medicare Other

## 2016-07-09 ENCOUNTER — Other Ambulatory Visit: Payer: Self-pay | Admitting: *Deleted

## 2016-07-09 VITALS — BP 124/73 | HR 57 | Temp 97.2°F | Ht 72.0 in | Wt 175.5 lb

## 2016-07-09 DIAGNOSIS — D45 Polycythemia vera: Secondary | ICD-10-CM

## 2016-07-09 DIAGNOSIS — M1990 Unspecified osteoarthritis, unspecified site: Secondary | ICD-10-CM

## 2016-07-09 DIAGNOSIS — Z87891 Personal history of nicotine dependence: Secondary | ICD-10-CM

## 2016-07-09 DIAGNOSIS — Z807 Family history of other malignant neoplasms of lymphoid, hematopoietic and related tissues: Secondary | ICD-10-CM

## 2016-07-09 DIAGNOSIS — M109 Gout, unspecified: Secondary | ICD-10-CM | POA: Diagnosis not present

## 2016-07-09 DIAGNOSIS — N400 Enlarged prostate without lower urinary tract symptoms: Secondary | ICD-10-CM | POA: Diagnosis not present

## 2016-07-09 DIAGNOSIS — K5090 Crohn's disease, unspecified, without complications: Secondary | ICD-10-CM | POA: Diagnosis not present

## 2016-07-09 DIAGNOSIS — R972 Elevated prostate specific antigen [PSA]: Secondary | ICD-10-CM

## 2016-07-09 DIAGNOSIS — I4510 Unspecified right bundle-branch block: Secondary | ICD-10-CM

## 2016-07-09 DIAGNOSIS — Z8042 Family history of malignant neoplasm of prostate: Secondary | ICD-10-CM

## 2016-07-09 DIAGNOSIS — Z800 Family history of malignant neoplasm of digestive organs: Secondary | ICD-10-CM

## 2016-07-09 DIAGNOSIS — M129 Arthropathy, unspecified: Secondary | ICD-10-CM | POA: Diagnosis not present

## 2016-07-09 DIAGNOSIS — Z86010 Personal history of colonic polyps: Secondary | ICD-10-CM

## 2016-07-09 DIAGNOSIS — D7581 Myelofibrosis: Secondary | ICD-10-CM

## 2016-07-09 DIAGNOSIS — Z803 Family history of malignant neoplasm of breast: Secondary | ICD-10-CM

## 2016-07-09 DIAGNOSIS — Z7982 Long term (current) use of aspirin: Secondary | ICD-10-CM

## 2016-07-09 DIAGNOSIS — Z79899 Other long term (current) drug therapy: Secondary | ICD-10-CM

## 2016-07-09 LAB — COMPREHENSIVE METABOLIC PANEL
ALT: 16 U/L — ABNORMAL LOW (ref 17–63)
AST: 36 U/L (ref 15–41)
Albumin: 4.2 g/dL (ref 3.5–5.0)
Alkaline Phosphatase: 67 U/L (ref 38–126)
Anion gap: 8 (ref 5–15)
BUN: 38 mg/dL — ABNORMAL HIGH (ref 6–20)
CO2: 24 mmol/L (ref 22–32)
Calcium: 9 mg/dL (ref 8.9–10.3)
Chloride: 104 mmol/L (ref 101–111)
Creatinine, Ser: 1.86 mg/dL — ABNORMAL HIGH (ref 0.61–1.24)
GFR calc Af Amer: 41 mL/min — ABNORMAL LOW (ref >60)
GFR calc non Af Amer: 35 mL/min — ABNORMAL LOW (ref >60)
Glucose, Bld: 79 mg/dL (ref 65–99)
Potassium: 4.9 mmol/L (ref 3.5–5.1)
Sodium: 136 mmol/L (ref 135–145)
Total Bilirubin: 0.4 mg/dL (ref 0.3–1.2)
Total Protein: 7.7 g/dL (ref 6.5–8.1)

## 2016-07-09 LAB — CBC WITH DIFFERENTIAL/PLATELET
Basophils Absolute: 0.1 10*3/uL (ref 0–0.1)
Basophils Relative: 1 %
Eosinophils Absolute: 0.2 10*3/uL (ref 0–0.7)
Eosinophils Relative: 1 %
HCT: 31.3 % — ABNORMAL LOW (ref 40.0–52.0)
Hemoglobin: 9.8 g/dL — ABNORMAL LOW (ref 13.0–18.0)
Lymphocytes Relative: 10 %
Lymphs Abs: 1.7 10*3/uL (ref 1.0–3.6)
MCH: 19.7 pg — ABNORMAL LOW (ref 26.0–34.0)
MCHC: 31.3 g/dL — ABNORMAL LOW (ref 32.0–36.0)
MCV: 62.9 fL — ABNORMAL LOW (ref 80.0–100.0)
Monocytes Absolute: 3.7 10*3/uL — ABNORMAL HIGH (ref 0.2–1.0)
Monocytes Relative: 22 %
Neutro Abs: 11.2 10*3/uL — ABNORMAL HIGH (ref 1.4–6.5)
Neutrophils Relative %: 66 %
Platelets: 268 10*3/uL (ref 150–440)
RBC: 4.98 MIL/uL (ref 4.40–5.90)
RDW: 33.7 % — ABNORMAL HIGH (ref 11.5–14.5)
WBC: 16.8 10*3/uL — ABNORMAL HIGH (ref 3.8–10.6)

## 2016-07-09 LAB — LACTATE DEHYDROGENASE: LDH: 663 U/L — ABNORMAL HIGH (ref 98–192)

## 2016-07-09 LAB — URIC ACID: Uric Acid, Serum: 5.6 mg/dL (ref 4.4–7.6)

## 2016-07-09 NOTE — Progress Notes (Signed)
Patient here today for labs and follow up. Patient states he is "normal for his condition."

## 2016-07-09 NOTE — Progress Notes (Signed)
[facility] day:  07/09/2016  Chief Complaint: Ruben [name] is a 70 y.o. 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  HPI: The patient  was last seen in the medical oncology clinic on 06/11/2016.  At that time, he felt better.  Arthritis pain had diminished.  Exam was stable. CBC included a hematocrit of 34.8, hemoglobin 10.5, MCV 59.8, platelets 415,000, white count 21,900 with an ANC of 15,900.  Urology was consulted for difficulty with bladder emptying.  CBC on 06/25/2016 included a hematocrit of 33.1, hemoglobin 9.1, platelets 1.18 million, white count 54,800 with an ANC of 47,200.  He was contacted regarding these labs. Referrals smear revealed marked thrombocytosis. Red cell showed poikilocytosis without nucleated red blood cells.  There was marked leukocytosis predominantly neutrophils. There was an occasional blast, less than 1%.  He was started on hydroxyurea 500 mg a day.  CBC on 07/02/2016 revealed a hematocrit of 31.7, hemoglobin 9.8, MCV 62.2, platelets 429,000, white count 30,600 with an ANC of 26,100. Hydroxyurea was stopped.  Symptomatically, he is doing well.  He notes the pain in his joints has dramatically improved.  His energy level is the same.  He has not yet seen in urology. He follows up with Adriana Simas on 08/04/2016 or 08/05/2016.   Past Medical History:  Diagnosis Date  . BPH (benign prostatic hyperplasia)   . Crohn disease ([place])   . Crohn's disease ([place])   . Glaucoma   . Glaucoma   . Gout   . History of chicken pox   . Right bundle branch block     Past Surgical History:  Procedure Laterality Date  . PROSTATE SURGERY  2008   Prostate Biopsy in [place] to elevated PSA.  per patient normal  . Skin Lesion Basal cell removed    . wart removal     from eyelid    Family History  Problem Relation Age of Onset  . Cancer Mother     Liver  .  Cancer Father     Prostate  . Cancer Sister     Hodgkins lymphoma  . Cancer Paternal Aunt     Breast    Social History:  reports that he quit smoking about 12 years ago. His smoking use included Cigarettes. He has a 5.00 pack-year smoking history. He does not have any smokeless tobacco history on file. He reports that he drinks about 1.2 oz of alcohol per week . He reports that he does not use drugs.  He previously worked for [employer].  He volunteers for [employer].  The patient is accompanied by his wife, [name], today.  Allergies: No Known Allergies  Current Medications: Current Outpatient Prescriptions  Medication Sig Dispense Refill  . allopurinol (ZYLOPRIM) 300 MG tablet TAKE 1 TABLET (300 MG TOTAL) BY MOUTH DAILY. 30 tablet 3  . aspirin 81 MG tablet Take 81 mg by mouth daily.    . Cyanocobalamin (RA VITAMIN B-12 TR) 1000 MCG TBCR     . ferrous sulfate 325 (65 FE) MG tablet Take 325 mg by mouth 3 (three) times a week.    . folic acid (FOLVITE) 1 MG tablet TAKE 1 TABLET (1,000 MCG TOTAL) BY MOUTH ONCE DAILY. MUST KEEP APPT FOR FURTHER REFILLS.    Marland Kitchen latanoprost (XALATAN) 0.005 % ophthalmic solution Apply 1 drop to eye daily.     . Multiple Vitamins-Minerals (CENTRUM SILVER ULTRA  MENS) TABS Take by mouth. Reported on 01/30/2016    . Omega-3 Fatty Acids (KP FISH OIL) 1200 MG CAPS Take by mouth.    . ruxolitinib phosphate (JAKAFI) 20 MG tablet Take 1 tablet (20 mg total) by mouth 2 (two) times daily. 60 tablet 3  . timolol (BETIMOL) 0.25 % ophthalmic solution Place 1 drop into both eyes daily.      No current facility-administered medications for this visit.     Review of Systems:  GENERAL:  Feels good.  No fevers or sweats.  Weight stable. PERFORMANCE STATUS (ECOG):  1 HEENT:  No visual changes, runny nose, sore throat, mouth sores or tenderness. Lungs: No shortness of breath or cough.  No hemoptysis. Cardiac:  No chest pain, palpitations, orthopnea, or PND. GI:   Diarrhea (no change).  No nausea, vomiting, constipation, melena or hematochezia. GU:  No urgency, frequency, dysuria, or hematuria. Musculoskeletal:  No back pain.  Knee and ankle better.  No muscle tenderness. Extremities:  Tendonitis in ankles.  Joints are better.  No swelling. Skin:  No rashes or skin changes. Neuro:  No headache, numbness or weakness, balance or coordination issues. Endocrine:  No diabetes, thyroid issues, hot flashes or night sweats. Psych:  No mood changes, depression or anxiety. Pain:  Joint pain, better. Review of systems:  All other systems reviewed and found to be negative.   Physical Exam: Blood pressure 124/73, pulse (!) 57, temperature 97.2 F (36.2 C), temperature source Tympanic, height 6' (1.829 m), weight 175 lb 7.8 oz (79.6 kg). GENERAL:  Well developed, well nourished, gentleman sitting comfortably in the exam room in no acute distress. MENTAL STATUS:  Alert and oriented to person, place and time. HEAD:  Pearline Cables hair.  Normocephalic, atraumatic, face symmetric, no Cushingoid features. EYES: Glasses. Blue eyes. Pupils equal round and reactive to light and accomodation.  No conjunctivitis or scleral icterus. ENT: Oropharynx clear without lesion. Tongue normal. Mucous membranes moist.  RESPIRATORY: Clear to auscultation without rales, wheezes or rhonchi. CARDIOVASCULAR: Regular rate and rhythm without murmur, rub or gallop. ABDOMEN: Soft, non-tender, with active bowel sounds, and no hepatomegaly. Spleen palpable 1-2 fingerbreaths below left costal margin.  No masses.  SKIN: No rashes, ulcers or lesions. EXTREMITIES: No edema, no skin discoloration or tenderness. No palpable cords. LYMPH NODES: No palpable cervical, supraclavicular, axillary or inguinal adenopathy  NEUROLOGICAL: Unremarkable. PSYCH: Appropriate.    Appointment on 07/09/2016  Component Date Value Ref Range Status  . WBC 07/09/2016 16.8* 3.8 - 10.6 K/uL Final  . RBC 07/09/2016  4.98  4.40 - 5.90 MIL/uL Final  . Hemoglobin 07/09/2016 9.8* 13.0 - 18.0 g/dL Final  . HCT 07/09/2016 31.3* 40.0 - 52.0 % Final  . MCV 07/09/2016 62.9* 80.0 - 100.0 fL Final  . MCH 07/09/2016 19.7* 26.0 - 34.0 pg Final  . MCHC 07/09/2016 31.3* 32.0 - 36.0 g/dL Final  . RDW 07/09/2016 33.7* 11.5 - 14.5 % Final  . Platelets 07/09/2016 268  150 - 440 K/uL Final  . Neutrophils Relative % 07/09/2016 66  % Final  . Neutro Abs 07/09/2016 11.2* 1.4 - 6.5 K/uL Final  . Lymphocytes Relative 07/09/2016 10  % Final  . Lymphs Abs 07/09/2016 1.7  1.0 - 3.6 K/uL Final  . Monocytes Relative 07/09/2016 22  % Final  . Monocytes Absolute 07/09/2016 3.7* 0.2 - 1.0 K/uL Final  . Eosinophils Relative 07/09/2016 1  % Final  . Eosinophils Absolute 07/09/2016 0.2  0 - 0.7 K/uL Final  . Basophils  Relative 07/09/2016 1  % Final  . Basophils Absolute 07/09/2016 0.1  0 - 0.1 K/uL Final  . Sodium 07/09/2016 136  135 - 145 mmol/L Final  . Potassium 07/09/2016 4.9  3.5 - 5.1 mmol/L Final  . Chloride 07/09/2016 104  101 - 111 mmol/L Final  . CO2 07/09/2016 24  22 - 32 mmol/L Final  . Glucose, Bld 07/09/2016 79  65 - 99 mg/dL Final  . BUN 07/09/2016 38* 6 - 20 mg/dL Final  . Creatinine, Ser 07/09/2016 1.86* 0.61 - 1.24 mg/dL Final  . Calcium 07/09/2016 9.0  8.9 - 10.3 mg/dL Final  . Total Protein 07/09/2016 7.7  6.5 - 8.1 g/dL Final  . Albumin 07/09/2016 4.2  3.5 - 5.0 g/dL Final  . AST 07/09/2016 36  15 - 41 U/L Final  . ALT 07/09/2016 16* 17 - 63 U/L Final  . Alkaline Phosphatase 07/09/2016 67  38 - 126 U/L Final  . Total Bilirubin 07/09/2016 0.4  0.3 - 1.2 mg/dL Final  . GFR calc non Af Amer 07/09/2016 35* >60 mL/min Final  . GFR calc Af Amer 07/09/2016 41* >60 mL/min Final   Comment: (NOTE) The eGFR has been calculated using the CKD EPI equation. This calculation has not been validated in all clinical situations. eGFR's persistently <60 mL/min signify possible Chronic Kidney Disease.   . Anion gap  07/09/2016 8  5 - 15 Final  . Uric Acid, Serum 07/09/2016 5.6  4.4 - 7.6 mg/dL Final  . LDH 07/09/2016 663* 98 - 192 U/L Final    Assessment:  Ruben [name] is a 70 y.o. 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  He has had polycythemia dating back to 2013.  Hematocrit was 62.1 with a hemoglobin of 19.8 on 03/28/2015.  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  Erythropoietin level was 1.1 (low).  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  He began a phlebotomy program on 03/28/2015 to maintain a hematocrit goal of < 45.  He last underwent phlebotomy on 05/02/2015.  He is on a baby aspirin.  He is followed closely by GI (Dr. Vira Agar) for a history of polyps and Crohn's disease.  Last colonoscopy was 3-4 years ago.  He has become iron deficient on his phlebotomy program (ferritin 21 on 11/28/2015).   CBC on 11/28/2015 revealed a hematocrit of 39.3, hemoglobin 11.9, platelets 463,000, WBC 32,000 with an ANC of 25,000.  Differential included 71% segs, 4% lymphocytes and 17% monocytes.  Peripheral smear revealed leukocytosis with predominantly mature neutrophils, increased monocytes and rare blasts (<1%).  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  Marrow was packed (95-100% cellularity) with pan myelosis, multi-lineage dyspoiesis, and no significant increase in blasts.  There was moderate to focally marked reticulin fibrosis (grade 2-3/3).  Storage iron was not identified.  Flow cytometry revealed non-specific atypical myeloid findings with no increase in blasts.  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  Cytogenetics were normal (46, XY).  B[facility] on [facility]/07/2016 [facility] revealed a hypercellular marrow (> 95%) with persistent involvement by myeloid neoplasm with 5% blasts.  There was mild reticulin  fibrosis.  FISH t(9;22) results were normal.  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  Cytogenetics are pending.  He was briefly on hydroxyurea 500 mg a day (12/05/2015 - 12/19/2015 and 06/25/2016 - 07/03/2016).  Platelet count increased to 1.18 million and white count to 54,800 without increased blasts on 06/25/2016.  CBC on 07/02/2016 revealed a platelets of 429,000 with a white count 30,600.  He began allopurinol on 12/19/2015.  He began [place] on 05/28/2016.  He has renal insufficiency (Cr 1.86; CrCl 35 ml/min).  Symptomatically, he feels good.  Arthritis pain is minimal.  Exam is stable.  Hematocrit is 31.3.  Platelet count is 268,000.  WBC is 16,800.  LDH is 663.  Plan: 1.  Labs today:  CBC with diff, CMP, LDH, uric acid, PSA. 2.  Discuss interval counts and close monitoring secondary to unexpected jump in counts and transient need for hydroxyurea. 3.  Continue ferrous sulfate 325 mg po q Mondays, Wednesday, Fridays. 4.  Follow-up with urology consult- prostate enlargement; unable to empty bladder completely. 5.  RTC in 1 week for labs (CBC with diff, BMP). 6.  RTC in 2 weeks for MD assessment and labs (CBC with diff, CMP, LDH, uric acid).   Lequita Asal, MD  07/09/2016 , 12:09 PM

## 2016-07-10 LAB — PSA: PSA: 2.02 ng/mL (ref 0.00–4.00)

## 2016-07-12 ENCOUNTER — Encounter: Payer: Self-pay | Admitting: Hematology and Oncology

## 2016-07-16 ENCOUNTER — Inpatient Hospital Stay: Payer: Medicare Other

## 2016-07-16 ENCOUNTER — Telehealth: Payer: Self-pay | Admitting: *Deleted

## 2016-07-16 DIAGNOSIS — D45 Polycythemia vera: Secondary | ICD-10-CM

## 2016-07-16 DIAGNOSIS — M109 Gout, unspecified: Secondary | ICD-10-CM | POA: Diagnosis not present

## 2016-07-16 DIAGNOSIS — N400 Enlarged prostate without lower urinary tract symptoms: Secondary | ICD-10-CM | POA: Diagnosis not present

## 2016-07-16 DIAGNOSIS — M129 Arthropathy, unspecified: Secondary | ICD-10-CM | POA: Diagnosis not present

## 2016-07-16 DIAGNOSIS — I4510 Unspecified right bundle-branch block: Secondary | ICD-10-CM | POA: Diagnosis not present

## 2016-07-16 DIAGNOSIS — K5090 Crohn's disease, unspecified, without complications: Secondary | ICD-10-CM | POA: Diagnosis not present

## 2016-07-16 LAB — CBC WITH DIFFERENTIAL/PLATELET
Band Neutrophils: 2 %
Basophils Absolute: 0.4 10*3/uL — ABNORMAL HIGH (ref 0–0.1)
Basophils Relative: 3 %
Eosinophils Absolute: 0.3 10*3/uL (ref 0–0.7)
Eosinophils Relative: 2 %
HCT: 30.9 % — ABNORMAL LOW (ref 40.0–52.0)
Hemoglobin: 9.8 g/dL — ABNORMAL LOW (ref 13.0–18.0)
Lymphocytes Relative: 12 %
Lymphs Abs: 1.5 10*3/uL (ref 1.0–3.6)
MCH: 20.5 pg — ABNORMAL LOW (ref 26.0–34.0)
MCHC: 31.5 g/dL — ABNORMAL LOW (ref 32.0–36.0)
MCV: 65 fL — ABNORMAL LOW (ref 80.0–100.0)
Metamyelocytes Relative: 1 %
Monocytes Absolute: 1.3 10*3/uL — ABNORMAL HIGH (ref 0.2–1.0)
Monocytes Relative: 10 %
Neutro Abs: 8.9 10*3/uL — ABNORMAL HIGH (ref 1.4–6.5)
Neutrophils Relative %: 67 %
Other: 3 %
Platelets: 348 10*3/uL (ref 150–440)
RBC: 4.76 MIL/uL (ref 4.40–5.90)
RDW: 34.8 % — ABNORMAL HIGH (ref 11.5–14.5)
WBC: 12.7 10*3/uL — ABNORMAL HIGH (ref 3.8–10.6)
nRBC: 1 /100 WBC — ABNORMAL HIGH

## 2016-07-16 LAB — BASIC METABOLIC PANEL
Anion gap: 8 (ref 5–15)
BUN: 40 mg/dL — ABNORMAL HIGH (ref 6–20)
CO2: 23 mmol/L (ref 22–32)
Calcium: 9.1 mg/dL (ref 8.9–10.3)
Chloride: 105 mmol/L (ref 101–111)
Creatinine, Ser: 2 mg/dL — ABNORMAL HIGH (ref 0.61–1.24)
GFR calc Af Amer: 37 mL/min — ABNORMAL LOW (ref >60)
GFR calc non Af Amer: 32 mL/min — ABNORMAL LOW (ref >60)
Glucose, Bld: 102 mg/dL — ABNORMAL HIGH (ref 65–99)
Potassium: 4.4 mmol/L (ref 3.5–5.1)
Sodium: 136 mmol/L (ref 135–145)

## 2016-07-16 LAB — PATHOLOGIST SMEAR REVIEW

## 2016-07-16 NOTE — Telephone Encounter (Signed)
-----   Message from Lequita Asal, MD sent at 07/16/2016  3:21 PM EDT ----- Regarding: Call patient  Creatinine increased. Please forward creatine to PCP.  Patient has bladder emptying issues. I would get a renal ultrasound to r/o hydronephrosis.  I believe he has an appt with urology.  M  ----- Message ----- From: Interface, Lab In [place] Sent: 07/16/2016   9:52 AM To: Lequita Asal, MD

## 2016-07-16 NOTE — Telephone Encounter (Signed)
Called patient to let him know his creatiine has increased.  Forwarded labs to PCP.  Patient states he does not have a urologist.  After talking with Judeen Hammans, RN, a referral was made but did not go through.  [facility] to make referral and they will call the patient with that appointment date and time.

## 2016-07-23 ENCOUNTER — Inpatient Hospital Stay: Payer: Medicare Other | Attending: Hematology and Oncology

## 2016-07-23 ENCOUNTER — Inpatient Hospital Stay (HOSPITAL_BASED_OUTPATIENT_CLINIC_OR_DEPARTMENT_OTHER): Payer: Medicare Other | Admitting: Hematology and Oncology

## 2016-07-23 ENCOUNTER — Other Ambulatory Visit: Payer: Self-pay | Admitting: *Deleted

## 2016-07-23 VITALS — BP 117/70 | HR 60 | Temp 97.0°F | Ht 72.0 in | Wt 179.6 lb

## 2016-07-23 DIAGNOSIS — D45 Polycythemia vera: Secondary | ICD-10-CM

## 2016-07-23 DIAGNOSIS — M129 Arthropathy, unspecified: Secondary | ICD-10-CM

## 2016-07-23 DIAGNOSIS — N189 Chronic kidney disease, unspecified: Secondary | ICD-10-CM | POA: Insufficient documentation

## 2016-07-23 DIAGNOSIS — Z7982 Long term (current) use of aspirin: Secondary | ICD-10-CM | POA: Diagnosis not present

## 2016-07-23 DIAGNOSIS — D7581 Myelofibrosis: Secondary | ICD-10-CM

## 2016-07-23 DIAGNOSIS — N1330 Unspecified hydronephrosis: Secondary | ICD-10-CM | POA: Insufficient documentation

## 2016-07-23 DIAGNOSIS — Z807 Family history of other malignant neoplasms of lymphoid, hematopoietic and related tissues: Secondary | ICD-10-CM

## 2016-07-23 DIAGNOSIS — D649 Anemia, unspecified: Secondary | ICD-10-CM | POA: Diagnosis not present

## 2016-07-23 DIAGNOSIS — R718 Other abnormality of red blood cells: Secondary | ICD-10-CM | POA: Diagnosis not present

## 2016-07-23 DIAGNOSIS — Z8042 Family history of malignant neoplasm of prostate: Secondary | ICD-10-CM | POA: Diagnosis not present

## 2016-07-23 DIAGNOSIS — N401 Enlarged prostate with lower urinary tract symptoms: Secondary | ICD-10-CM | POA: Diagnosis not present

## 2016-07-23 DIAGNOSIS — Z79899 Other long term (current) drug therapy: Secondary | ICD-10-CM | POA: Diagnosis not present

## 2016-07-23 DIAGNOSIS — F17210 Nicotine dependence, cigarettes, uncomplicated: Secondary | ICD-10-CM | POA: Diagnosis not present

## 2016-07-23 DIAGNOSIS — N400 Enlarged prostate without lower urinary tract symptoms: Secondary | ICD-10-CM | POA: Insufficient documentation

## 2016-07-23 DIAGNOSIS — Z800 Family history of malignant neoplasm of digestive organs: Secondary | ICD-10-CM

## 2016-07-23 DIAGNOSIS — N289 Disorder of kidney and ureter, unspecified: Secondary | ICD-10-CM | POA: Insufficient documentation

## 2016-07-23 DIAGNOSIS — K5090 Crohn's disease, unspecified, without complications: Secondary | ICD-10-CM | POA: Insufficient documentation

## 2016-07-23 DIAGNOSIS — I4510 Unspecified right bundle-branch block: Secondary | ICD-10-CM

## 2016-07-23 DIAGNOSIS — Z803 Family history of malignant neoplasm of breast: Secondary | ICD-10-CM | POA: Insufficient documentation

## 2016-07-23 DIAGNOSIS — Z86010 Personal history of colonic polyps: Secondary | ICD-10-CM | POA: Diagnosis not present

## 2016-07-23 LAB — COMPREHENSIVE METABOLIC PANEL
ALT: 15 U/L — ABNORMAL LOW (ref 17–63)
AST: 37 U/L (ref 15–41)
Albumin: 4.4 g/dL (ref 3.5–5.0)
Alkaline Phosphatase: 70 U/L (ref 38–126)
Anion gap: 7 (ref 5–15)
BUN: 32 mg/dL — ABNORMAL HIGH (ref 6–20)
CO2: 24 mmol/L (ref 22–32)
Calcium: 9.2 mg/dL (ref 8.9–10.3)
Chloride: 105 mmol/L (ref 101–111)
Creatinine, Ser: 1.87 mg/dL — ABNORMAL HIGH (ref 0.61–1.24)
GFR calc Af Amer: 41 mL/min — ABNORMAL LOW (ref >60)
GFR calc non Af Amer: 35 mL/min — ABNORMAL LOW (ref >60)
Glucose, Bld: 89 mg/dL (ref 65–99)
Potassium: 4.9 mmol/L (ref 3.5–5.1)
Sodium: 136 mmol/L (ref 135–145)
Total Bilirubin: 0.4 mg/dL (ref 0.3–1.2)
Total Protein: 8 g/dL (ref 6.5–8.1)

## 2016-07-23 LAB — CBC WITH DIFFERENTIAL/PLATELET
Basophils Absolute: 0 10*3/uL (ref 0–0.1)
Basophils Relative: 0 %
Eosinophils Absolute: 0.2 10*3/uL (ref 0–0.7)
Eosinophils Relative: 1 %
HCT: 31.4 % — ABNORMAL LOW (ref 40.0–52.0)
Hemoglobin: 9.9 g/dL — ABNORMAL LOW (ref 13.0–18.0)
Lymphocytes Relative: 10 %
Lymphs Abs: 1.5 10*3/uL (ref 1.0–3.6)
MCH: 21.2 pg — ABNORMAL LOW (ref 26.0–34.0)
MCHC: 31.5 g/dL — ABNORMAL LOW (ref 32.0–36.0)
MCV: 67.1 fL — ABNORMAL LOW (ref 80.0–100.0)
Monocytes Absolute: 2.4 10*3/uL — ABNORMAL HIGH (ref 0.2–1.0)
Monocytes Relative: 16 %
Neutro Abs: 11.2 10*3/uL — ABNORMAL HIGH (ref 1.4–6.5)
Neutrophils Relative %: 73 %
Platelets: 450 10*3/uL — ABNORMAL HIGH (ref 150–440)
RBC: 4.67 MIL/uL (ref 4.40–5.90)
RDW: 35.9 % — ABNORMAL HIGH (ref 11.5–14.5)
WBC: 15.3 10*3/uL — ABNORMAL HIGH (ref 3.8–10.6)

## 2016-07-23 LAB — URIC ACID: Uric Acid, Serum: 5.9 mg/dL (ref 4.4–7.6)

## 2016-07-23 LAB — LACTATE DEHYDROGENASE: LDH: 663 U/L — ABNORMAL HIGH (ref 98–192)

## 2016-07-23 NOTE — Progress Notes (Addendum)
[facility] day:  07/23/2016  Chief Complaint: Ruben [name] is a 70 y.o. male with polycythemia rubra vera (PV) who is seen for 2 week assessment on Jakafi (ruxolitinib).  HPI: The patient was last seen in the medical oncology clinic on 07/09/2016.  At that time, he was doing well. Joint pain had dramatically improved.  His energy level was the same.  He had not yet seen urology.   CBC revealed a hematocrit 31.3, hemoglobin 9.8, MCV 62.9, platelets 268,000, white count 16,800 with an ANC of 11,200.  Comprehensive metabolic panel included a BUN of 38 and a creatinine of 1.86.  LDH was 663.  CBC on 07/16/2016 included a hematocrit 30.9, hemoglobin 9.8, MCV 65, platelets 348,000, white count 12,700 with an ANC of 8900.  Peripheral smear revealed abnormal giant platelets. There was marked poikilocytosis without significant numbers of nucleated red blood cells. There were no myelocytes. There were rare blasts.  A few megakaryocytic nuclei were noted.  Basic metabolic panel included a BUN of 40 and a creatinine of 2.0.  He states that he has an appointment with urology on 08/08/2016.  He has an appointment with Dr. Evelene Croon on 08/04/2016.  Symptomatically, he denies any new complaints.  He denies any bruising or bleeding.  He notes some urinary frequency.   Past Medical History:  Diagnosis Date  . BPH (benign prostatic hyperplasia)   . Crohn disease ([place])   . Crohn's disease ([place])   . Glaucoma   . Glaucoma   . Gout   . History of chicken pox   . Right bundle branch block     Past Surgical History:  Procedure Laterality Date  . PROSTATE SURGERY  2008   Prostate Biopsy in [place] to elevated PSA.  per patient normal  . Skin Lesion Basal cell removed    . wart removal     from eyelid    Family History  Problem Relation Age of Onset  . Cancer Mother     Liver  . Cancer Father     Prostate  . Cancer Sister     Hodgkins lymphoma   . Cancer Paternal Aunt     Breast    Social History:  reports that he quit smoking about 12 years ago. His smoking use included Cigarettes. He has a 5.00 pack-year smoking history. He does not have any smokeless tobacco history on file. He reports that he drinks about 1.2 oz of alcohol per week . He reports that he does not use drugs.  He previously worked for [employer].  He volunteers for [employer].  He will be travelling to [facility] from 08/13/2016 - 08/17/2016.  The patient is accompanied by his wife, Ruben [name], today.  Allergies: No Known Allergies  Current Medications: Current Outpatient Prescriptions  Medication Sig Dispense Refill  . allopurinol (ZYLOPRIM) 300 MG tablet TAKE 1 TABLET (300 MG TOTAL) BY MOUTH DAILY. 30 tablet 3  . aspirin 81 MG tablet Take 81 mg by mouth daily.    . Cyanocobalamin (RA VITAMIN B-12 TR) 1000 MCG TBCR     . ferrous sulfate 325 (65 FE) MG tablet Take 325 mg by mouth 3 (three) times a week.    . folic acid (FOLVITE) 1 MG tablet TAKE 1 TABLET (1,000 MCG TOTAL) BY MOUTH ONCE DAILY. MUST KEEP APPT FOR FURTHER REFILLS.    Marland Kitchen latanoprost (XALATAN) 0.005 % ophthalmic solution Apply 1 drop to eye daily.     Marland Kitchen  Multiple Vitamins-Minerals (CENTRUM SILVER ULTRA MENS) TABS Take by mouth. Reported on 01/30/2016    . Omega-3 Fatty Acids (KP FISH OIL) 1200 MG CAPS Take by mouth.    . ruxolitinib phosphate (JAKAFI) 20 MG tablet Take 1 tablet (20 mg total) by mouth 2 (two) times daily. 60 tablet 3  . timolol (BETIMOL) 0.25 % ophthalmic solution Place 1 drop into both eyes daily.      No current facility-administered medications for this visit.     Review of Systems:  GENERAL:  Feels fine.  No fevers or sweats.  Weight up 4 pounds. PERFORMANCE STATUS (ECOG):  1 HEENT:  No visual changes, runny [name], sore throat, mouth sores or tenderness. Lungs: No shortness of breath or cough.  No hemoptysis. Cardiac:  No chest pain, palpitations, orthopnea, or PND. GI:   Diarrhea (chronic).  No nausea, vomiting, constipation, melena or hematochezia. GU:  Urinary frequency.  Nocturia.  No urgency, dysuria, or hematuria. Musculoskeletal:  No back pain.  Knee and ankle better.  No muscle tenderness. Extremities:  Tendonitis in ankles.  Joints feel better since initiation of treatment.  No swelling. Skin:  No rashes or skin changes. Neuro:  No headache, numbness or weakness, balance or coordination issues. Endocrine:  No diabetes, thyroid issues, hot flashes or night sweats. Psych:  No mood changes, depression or anxiety. Pain:  Joint pain, improved. Review of systems:  All other systems reviewed and found to be negative.   Physical Exam: Blood pressure 117/70, pulse 60, temperature 97 F (36.1 C), temperature source Tympanic, height 6' (1.829 m), weight 179 lb 9 oz (81.4 kg). GENERAL:  Well developed, well nourished, gentleman sitting comfortably in the exam room in no acute distress. MENTAL STATUS:  Alert and oriented to person, place and time. HEAD:  Pearline Cables hair.  Normocephalic, atraumatic, face symmetric, no Cushingoid features. EYES: Glasses. Blue eyes. Pupils equal round and reactive to light and accomodation.  No conjunctivitis or scleral icterus. ENT: Oropharynx clear without lesion. Tongue normal. Mucous membranes moist.  RESPIRATORY: Clear to auscultation without rales, wheezes or rhonchi. CARDIOVASCULAR: Regular rate and rhythm without murmur, rub or gallop. ABDOMEN: Soft, non-tender, with active bowel sounds, and no hepatomegaly. Spleen palpable 1-2 fingerbreaths below left costal margin (stable).  No masses.  SKIN: No rashes, ulcers or lesions. EXTREMITIES: No edema, no skin discoloration or tenderness. No palpable cords. LYMPH NODES: No palpable cervical, supraclavicular, axillary or inguinal adenopathy  NEUROLOGICAL: Unremarkable. PSYCH: Appropriate.    Appointment on 07/23/2016  Component Date Value Ref Range Status  . WBC  07/23/2016 15.3* 3.8 - 10.6 K/uL Final  . RBC 07/23/2016 4.67  4.40 - 5.90 MIL/uL Final  . Hemoglobin 07/23/2016 9.9* 13.0 - 18.0 g/dL Final  . HCT 07/23/2016 31.4* 40.0 - 52.0 % Final  . MCV 07/23/2016 67.1* 80.0 - 100.0 fL Final  . MCH 07/23/2016 21.2* 26.0 - 34.0 pg Final  . MCHC 07/23/2016 31.5* 32.0 - 36.0 g/dL Final  . RDW 07/23/2016 35.9* 11.5 - 14.5 % Final  . Platelets 07/23/2016 450* 150 - 440 K/uL Final  . Neutrophils Relative % 07/23/2016 73  % Final  . Lymphocytes Relative 07/23/2016 10  % Final  . Monocytes Relative 07/23/2016 16  % Final  . Eosinophils Relative 07/23/2016 1  % Final  . Basophils Relative 07/23/2016 0  % Final  . Neutro Abs 07/23/2016 11.2* 1.4 - 6.5 K/uL Final  . Lymphs Abs 07/23/2016 1.5  1.0 - 3.6 K/uL Final  . Monocytes Absolute  07/23/2016 2.4* 0.2 - 1.0 K/uL Final  . Eosinophils Absolute 07/23/2016 0.2  0 - 0.7 K/uL Final  . Basophils Absolute 07/23/2016 0.0  0 - 0.1 K/uL Final  . Sodium 07/23/2016 136  135 - 145 mmol/L Final  . Potassium 07/23/2016 4.9  3.5 - 5.1 mmol/L Final  . Chloride 07/23/2016 105  101 - 111 mmol/L Final  . CO2 07/23/2016 24  22 - 32 mmol/L Final  . Glucose, Bld 07/23/2016 89  65 - 99 mg/dL Final  . BUN 07/23/2016 32* 6 - 20 mg/dL Final  . Creatinine, Ser 07/23/2016 1.87* 0.61 - 1.24 mg/dL Final  . Calcium 07/23/2016 9.2  8.9 - 10.3 mg/dL Final  . Total Protein 07/23/2016 8.0  6.5 - 8.1 g/dL Final  . Albumin 07/23/2016 4.4  3.5 - 5.0 g/dL Final  . AST 07/23/2016 37  15 - 41 U/L Final  . ALT 07/23/2016 15* 17 - 63 U/L Final  . Alkaline Phosphatase 07/23/2016 70  38 - 126 U/L Final  . Total Bilirubin 07/23/2016 0.4  0.3 - 1.2 mg/dL Final  . GFR calc non Af Amer 07/23/2016 35* >60 mL/min Final  . GFR calc Af Amer 07/23/2016 41* >60 mL/min Final   Comment: (NOTE) The eGFR has been calculated using the CKD EPI equation. This calculation has not been validated in all clinical situations. eGFR's persistently <60 mL/min signify  possible Chronic Kidney Disease.   . Anion gap 07/23/2016 7  5 - 15 Final  . LDH 07/23/2016 663* 98 - 192 U/L Final  . Uric Acid, Serum 07/23/2016 5.9  4.4 - 7.6 mg/dL Final    Assessment:  Ruben [name] is a 70 y.o. 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  He has had polycythemia dating back to 2013.  Hematocrit was 62.1 with a hemoglobin of 19.8 on 03/28/2015.  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  Erythropoietin level was 1.1 (low).  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  He began a phlebotomy program on 03/28/2015 to maintain a hematocrit goal of < 45.  He last underwent phlebotomy on 05/02/2015.  He is on a baby aspirin.  He is followed closely by GI (Dr. Vira Agar) for a history of polyps and Crohn's disease.  Last colonoscopy was 3-4 years ago.  He has become iron deficient on his phlebotomy program (ferritin 21 on 11/28/2015).   CBC on 11/28/2015 revealed a hematocrit of 39.3, hemoglobin 11.9, platelets 463,000, WBC 32,000 with an ANC of 25,000.  Differential included 71% segs, 4% lymphocytes and 17% monocytes.  Peripheral smear revealed leukocytosis with predominantly mature neutrophils, increased monocytes and rare blasts (<1%).  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  Marrow was packed (95-100% cellularity) with pan myelosis, multi-lineage dyspoiesis, and no significant increase in blasts.  There was moderate to focally marked reticulin fibrosis (grade 2-3/3).  Storage iron was not identified.  Flow cytometry revealed non-specific atypical myeloid findings with no increase in blasts.  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  Cytogenetics were normal (46, XY).  Bone marrow on 04/07/2016 at [facility] revealed a hypercellular marrow (> 95%) with persistent involvement by myeloid  neoplasm with 5% blasts.  There was mild reticulin fibrosis.  FISH t(9;22) results were normal.  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  Cytogenetics are pending.  He was briefly on hydroxyurea  500 mg a day (12/05/2015 - 12/19/2015 and 06/25/2016 - 07/03/2016).   Platelet count increased to 1.18 million and white count to 54,800 without increased blasts on 06/25/2016.  CBC on 07/02/2016 revealed a platelets of 429,000 with a white count 30,600.  He began allopurinol on 12/19/2015.  He began Jakafi 20 mg BID on 05/28/2016.  He has renal insufficiency (Cr 1.86; CrCl 35 ml/min).  PSA was 2.02 on 07/09/2016.  He has bladder emptying issues.  Symptomatically, he feels good.  Arthritis pain is minimal.  Exam is stable.  Counts include a hematocrit 31.5, platelets 450,000 and WBC 15,300.  LDH is 663.  Plan: 1.  Labs today:  CBC with diff, CMP, LDH. 2.  Discuss counts and close monitoring secondary to unexpected jump in counts and transient need for hydroxyurea. 3.  Continue ferrous sulfate 325 mg po q Mondays, Wednesday, Fridays. 4.  Renal ultrasound- r/o hydronephrosis. 5.  Urology consult on 08/08/2016. 6.  Follow-up with Dr. Evelene Croon at [facility] on 08/04/2016. 7.  RTC in 1 week for CBC with diff. 8.  RTC in 4 weeks for MD assessment and labs (CBC with diff, CMP, LDH).   Lequita Asal, MD  07/23/2016 , 11:26 AM

## 2016-07-23 NOTE — Progress Notes (Signed)
No changes since last appointment

## 2016-07-24 ENCOUNTER — Other Ambulatory Visit: Payer: Self-pay | Admitting: Hematology and Oncology

## 2016-07-24 ENCOUNTER — Telehealth: Payer: Self-pay | Admitting: Hematology and Oncology

## 2016-07-24 DIAGNOSIS — N289 Disorder of kidney and ureter, unspecified: Secondary | ICD-10-CM

## 2016-07-24 NOTE — Telephone Encounter (Signed)
Patient needs [place] before seeing urologist on 11/10. The order in Epic is under class "Hospital Performed" which is the designation for inpatient only. They can't schedule it until you change the class to "Ancillary performed." Please change asap so pt can get scheduled. They are growing anxious about getting results in time for urology appt. Thanks.

## 2016-07-28 ENCOUNTER — Encounter: Payer: Self-pay | Admitting: Urology

## 2016-07-28 ENCOUNTER — Ambulatory Visit
Admission: RE | Admit: 2016-07-28 | Discharge: 2016-07-28 | Disposition: A | Discharge status: Home / Self Care | Payer: Medicare Other | Source: Ambulatory Visit | Attending: Hematology and Oncology | Admitting: Hematology and Oncology

## 2016-07-28 ENCOUNTER — Telehealth: Payer: Self-pay | Admitting: Urology

## 2016-07-28 ENCOUNTER — Ambulatory Visit (INDEPENDENT_AMBULATORY_CARE_PROVIDER_SITE_OTHER): Payer: Medicare Other | Admitting: Urology

## 2016-07-28 ENCOUNTER — Telehealth: Payer: Self-pay | Admitting: *Deleted

## 2016-07-28 VITALS — BP 153/81 | HR 65 | Ht 72.0 in | Wt 175.0 lb

## 2016-07-28 DIAGNOSIS — N1330 Unspecified hydronephrosis: Secondary | ICD-10-CM | POA: Diagnosis not present

## 2016-07-28 DIAGNOSIS — N138 Other obstructive and reflux uropathy: Secondary | ICD-10-CM

## 2016-07-28 DIAGNOSIS — N289 Disorder of kidney and ureter, unspecified: Secondary | ICD-10-CM | POA: Diagnosis not present

## 2016-07-28 DIAGNOSIS — N183 Chronic kidney disease, stage 3 unspecified: Secondary | ICD-10-CM

## 2016-07-28 DIAGNOSIS — R339 Retention of urine, unspecified: Secondary | ICD-10-CM

## 2016-07-28 DIAGNOSIS — N401 Enlarged prostate with lower urinary tract symptoms: Secondary | ICD-10-CM | POA: Diagnosis not present

## 2016-07-28 LAB — URINALYSIS, COMPLETE
BILIRUBIN UA: NEGATIVE
Glucose, UA: NEGATIVE
Ketones, UA: NEGATIVE
Leukocytes, UA: NEGATIVE
Nitrite, UA: NEGATIVE
PH UA: 5.5 (ref 5.0–7.5)
PROTEIN UA: NEGATIVE
Specific Gravity, UA: <1.005 — ABNORMAL LOW (ref 1.005–1.030)
UUROB: 0.2 mg/dL (ref 0.2–1.0)

## 2016-07-28 LAB — MICROSCOPIC EXAMINATION
Epithelial Cells (non renal): NONE SEEN /hpf (ref 0–10)
WBC UA: NONE SEEN /HPF (ref 0–?)

## 2016-07-28 MED ORDER — TAMSULOSIN HCL 0.4 MG PO CAPS: 0.4000 mg | ORAL_CAPSULE | Freq: Every day | ORAL | 11 refills | Status: DC

## 2016-07-28 NOTE — Telephone Encounter (Signed)
See previous quick note. I called urology and they could see him today in [place] but pt would have to get there in 45 min. I contacted pt and he can't get there today. He willl keep Friday visit.

## 2016-07-28 NOTE — Telephone Encounter (Signed)
Please call and remind this patient to pick up Flomax.  Sent to his pharmacy.  Hollice Espy, MD

## 2016-07-28 NOTE — Telephone Encounter (Signed)
Called report CLINICAL DATA:  Renal insufficiency  EXAM: RENAL / URINARY TRACT ULTRASOUND COMPLETE  COMPARISON:  CT 08/24/2012  FINDINGS: Right Kidney:  Length: 12.9 cm. There is severe hydronephrosis. No focal parenchymal lesion. Renal parenchyma is isoechoic compared to adjacent liver.  Left Kidney:  Length: 12.3 cm. Mild-moderately severe hydronephrosis. No focal lesion.  Bladder:  Distended.  Calculated postvoid  residual 1370 cc.  IMPRESSION: 1. Bilateral hydronephrosis right worse than left and a large postvoid residual suggesting bladder dysfunction or outlet obstruction.

## 2016-07-28 NOTE — Progress Notes (Signed)
07/28/2016 8:33 PM   Ruben [name] 30-Dec-1945 [id number]  Referring provider: [name], MD [id number] [place], [place] [address]  Chief Complaint  Patient presents with  . Urinary Retention    HPI: 70 year old male with history of polycythemia vera, Crohn's diease who was seen earlier today by medical oncology, Dr. Mike Gip.  He was initially referred to our office for further evaluation of BPH with incomplete bladder emptying. He underwent a renal bladder ultrasound today as part of that evaluation earlier today which shows bilateral hydronephrosis, right greater than left with massively distended bladder, calculated residual of 1370 cc.  Most recent PSA 2.02 on 07/09/2016.  He has had a recent upward trending creatinine, 0. 97 back in 2013 which is increased to 1.52 in March and slowly up to 1.87 most recently on 07/23/2016.  He does report that over the last year or so, he's had increasing issues urinating. He reports that he has not been able to fully empty is bladder and often return to the bathroom shortly after voiding 2 to incomplete bladder emptying. He gets up 2-4 times nightly to void. No incontinence or leakage. His stream is also weak.  He currently takes no medication for his prostate.  He did see a urologist about 15 years ago for elevated PSA. He underwent prostate biopsy which was negative. His PSA has since normalized.   PMH: Past Medical History:  Diagnosis Date  . BPH (benign prostatic hyperplasia)   . Crohn disease ([place])   . Crohn's disease ([place])   . Glaucoma   . Glaucoma   . Gout   . History of chicken pox   . Right bundle branch block     Surgical History: Past Surgical History:  Procedure Laterality Date  . PROSTATE SURGERY  2008   Prostate Biopsy in [place] to elevated PSA.  per patient normal  . Skin Lesion Basal cell removed    . wart removal     from eyelid    Home Medications:    Medication List         Accurate as of 07/28/16  8:33 PM. Always use your most recent med list.          allopurinol 300 MG tablet Commonly known as:  ZYLOPRIM TAKE 1 TABLET (300 MG TOTAL) BY MOUTH DAILY.   aspirin 81 MG tablet Take 81 mg by mouth daily.   CENTRUM SILVER ULTRA MENS Tabs Take by mouth. Reported on 01/30/2016   ferrous sulfate 325 (65 FE) MG tablet Take 325 mg by mouth 3 (three) times a week.   folic acid 1 MG tablet Commonly known as:  FOLVITE TAKE 1 TABLET (1,000 MCG TOTAL) BY MOUTH ONCE DAILY. MUST KEEP APPT FOR FURTHER REFILLS.   KP FISH OIL 1200 MG Caps Take by mouth.   latanoprost 0.005 % ophthalmic solution Commonly known as:  XALATAN Apply 1 drop to eye daily.   RA VITAMIN B-12 TR 1000 MCG Tbcr Generic drug:  Cyanocobalamin   ruxolitinib phosphate 20 MG tablet Commonly known as:  JAKAFI Take 1 tablet (20 mg total) by mouth 2 (two) times daily.   timolol 0.25 % ophthalmic solution Commonly known as:  BETIMOL Place 1 drop into both eyes daily.       Allergies: No Known Allergies  Family History: Family History  Problem Relation Age of Onset  . Cancer Mother     Liver  . Cancer Father     Prostate  .  Cancer Sister     Hodgkins lymphoma  . Cancer Paternal Aunt     Breast    Social History:  reports that he quit smoking about 12 years ago. His smoking use included Cigarettes. He has a 5.00 pack-year smoking history. He does not have any smokeless tobacco history on file. He reports that he drinks about 1.2 oz of alcohol per week . He reports that he does not use drugs.  ROS: UROLOGY Frequent Urination?: Yes Hard to postpone urination?: No Burning/pain with urination?: No Get up at night to urinate?: Yes Leakage of urine?: Yes Urine stream starts and stops?: Yes Trouble starting stream?: No Do you have to strain to urinate?: No Blood in urine?: No Urinary tract infection?: No Sexually transmitted disease?: No Injury to kidneys or bladder?:  No Painful intercourse?: No Weak stream?: Yes Erection problems?: No Penile pain?: No  Gastrointestinal Nausea?: No Vomiting?: No Indigestion/heartburn?: No Diarrhea?: No Constipation?: No  Constitutional Fever: No Night sweats?: No Weight loss?: No Fatigue?: No  Skin Skin rash/lesions?: No Itching?: No  Eyes Blurred vision?: No Double vision?: No  Ears/Nose/Throat Sore throat?: No Sinus problems?: No  Hematologic/Lymphatic Swollen glands?: No Easy bruising?: No  Cardiovascular Leg swelling?: No Chest pain?: No  Respiratory Cough?: No Shortness of breath?: No  Endocrine Excessive thirst?: No  Musculoskeletal Back pain?: No Joint pain?: No  Neurological Headaches?: No Dizziness?: No  Psychologic Depression?: No Anxiety?: No  Physical Exam: BP (!) 153/81   Pulse 65   Ht 6' (1.829 m)   Wt 175 lb (79.4 kg)   BMI 23.73 kg/m   Constitutional:  Alert and oriented, No acute distress.  Wife present today. HEENT: [place] AT, moist mucus membranes.  Trachea midline, no masses. Cardiovascular: No clubbing, cyanosis, or edema. Respiratory: Normal respiratory effort, no increased work of breathing. GI: Abdomen is soft, nontender.  Fullness and suprapubic area up to umbilicus appreciated. GU: Normal circumcised phallus with orthotopic meatus. Bilateral descended testicles. Skin: No rashes, bruises or suspicious lesions. Lymph: No cervical or inguinal adenopathy. Neurologic: Grossly intact, no focal deficits, moving all 4 extremities. Psychiatric: Normal mood and affect.  Laboratory Data: Lab Results  Component Value Date   WBC 15.3 (H) 07/23/2016   HGB 9.9 (L) 07/23/2016   HCT 31.4 (L) 07/23/2016   MCV 67.1 (L) 07/23/2016   PLT 450 (H) 07/23/2016    Lab Results  Component Value Date   CREATININE 1.87 (H) 07/23/2016    Lab Results  Component Value Date   PSA 2.02 07/09/2016   PSA 2.1 04/10/2014    Urinalysis Results for orders placed or  performed in visit on 07/28/16  Microscopic Examination  Result Value Ref Range   WBC, UA None seen 0 - 5 /hpf   RBC, UA 0-2 0 - 2 /hpf   Epithelial Cells (non renal) None seen 0 - 10 /hpf   Bacteria, UA Few (A) None seen/Few  Urinalysis, Complete  Result Value Ref Range   Specific Gravity, UA <1.005 (L) 1.005 - 1.030   pH, UA 5.5 5.0 - 7.5   Color, UA Yellow Yellow   Appearance Ur Clear Clear   Leukocytes, UA Negative Negative   Protein, UA Negative Negative/Trace   Glucose, UA Negative Negative   Ketones, UA Negative Negative   RBC, UA Trace (A) Negative   Bilirubin, UA Negative Negative   Urobilinogen, Ur 0.2 0.2 - 1.0 mg/dL   Nitrite, UA Negative Negative   Microscopic Examination See below:  Pertinent Imaging: CLINICAL DATA:  Renal insufficiency  EXAM: RENAL / URINARY TRACT ULTRASOUND COMPLETE  COMPARISON:  CT 08/24/2012  FINDINGS: Right Kidney:  Length: 12.9 cm. There is severe hydronephrosis. No focal parenchymal lesion. Renal parenchyma is isoechoic compared to adjacent liver.  Left Kidney:  Length: 12.3 cm. Mild-moderately severe hydronephrosis. No focal lesion.  Bladder:  Distended.  Calculated postvoid  residual 1370 cc.  IMPRESSION: 1. Bilateral hydronephrosis right worse than left and a large postvoid residual suggesting bladder dysfunction or outlet obstruction.   Electronically Signed   By: Lucrezia Europe M.D.   On: 07/28/2016 11:35  RUS personally reviewed today  Procedure: Patient's phallus was prepped and draped in the standard fashion. An 85 French coud catheter was advanced per urethra into the bladder. The balloon was inflated with 10 cc of sterile water. 2 L of clear yellow urine was drained from the bladder. The catheter was secured to the patient's left inner thigh. Catheter bag and leg bag teaching were performed.  Assessment & Plan:    1. Urinary retention 2000 L in bladder upon Foley placement today Bilateral  hydroureteronephrosis Recommend maintaining Foley catheter for at least a week voiding trial thereafter, will likely need to CIC and or UDS thereafter given suspected chronicity of retention  - US Renal; Future - Urinalysis, Complete - CULTURE, URINE COMPREHENSIVE  2. Bilateral hydronephrosis Plan for renal ultrasound in 1 week following catheter placement to ensure that this is the etiology of his hydronephrosis especially given his history of Crohn's - US Renal; Future  3. CKD (chronic kidney disease) stage 3, GFR 30-59 ml/min Possibly obstructive component. We'll recheck next visit after catheter has been in place.  4. BPH with obstruction/lower urinary tract symptoms DRE next visit, deferred today Consider addition of finasteride Start Flomax   Return in about 10 days (around 08/07/2016) for f/u RUS, DRE, BMP, possible voiding trial.  Hollice Espy, MD  [place] [address], [place], [place] [address] [phone]   I spent 45 min with this patient of which greater than 50% was spent in counseling and coordination of care with the patient.

## 2016-07-28 NOTE — Telephone Encounter (Signed)
I called [facility]rology and [facility] and she states the referral is in correct. I asked her if appt can be moved up due to the u/s results.  Sharyn Lull says that pt can come to [place] office in the next 45 min. They can see him today otherwise they do not have another appt until Friday when his appt is in [place].  I called pt and got him on cell phone and he states he can not make it today. I asked pt how he was doing on drinking fluids and was he having any issues with urinating and he states he can urinate but he goes frequently sometimes in the day.

## 2016-07-29 ENCOUNTER — Telehealth: Payer: Self-pay

## 2016-07-29 MED ORDER — TAMSULOSIN 0.4 MG CAPSULE
0 days
Start: 2016-07-29 — End: ?

## 2016-07-29 NOTE — Telephone Encounter (Signed)
LMOM- dont forget flomax as discussed at appt.

## 2016-07-29 NOTE — Telephone Encounter (Signed)
Pt called stating he is having a lot of blood in his urine and wanted to see if this is normal. Reinforced with pt bleeding with a catheter is normal. Reinforced with pt to drink plenty of fluids as this will help thin out the blood. Pt voiced understanding.

## 2016-07-30 ENCOUNTER — Inpatient Hospital Stay: Payer: Medicare Other

## 2016-07-30 DIAGNOSIS — R718 Other abnormality of red blood cells: Secondary | ICD-10-CM | POA: Diagnosis not present

## 2016-07-30 DIAGNOSIS — N1330 Unspecified hydronephrosis: Secondary | ICD-10-CM | POA: Diagnosis not present

## 2016-07-30 DIAGNOSIS — N401 Enlarged prostate with lower urinary tract symptoms: Secondary | ICD-10-CM | POA: Diagnosis not present

## 2016-07-30 DIAGNOSIS — D45 Polycythemia vera: Secondary | ICD-10-CM | POA: Diagnosis not present

## 2016-07-30 DIAGNOSIS — D649 Anemia, unspecified: Secondary | ICD-10-CM | POA: Diagnosis not present

## 2016-07-30 DIAGNOSIS — N189 Chronic kidney disease, unspecified: Secondary | ICD-10-CM | POA: Diagnosis not present

## 2016-07-30 LAB — COMPREHENSIVE METABOLIC PANEL
ALT: 15 U/L — ABNORMAL LOW (ref 17–63)
AST: 35 U/L (ref 15–41)
Albumin: 4 g/dL (ref 3.5–5.0)
Alkaline Phosphatase: 69 U/L (ref 38–126)
Anion gap: 6 (ref 5–15)
BUN: 37 mg/dL — ABNORMAL HIGH (ref 6–20)
CO2: 25 mmol/L (ref 22–32)
Calcium: 9.1 mg/dL (ref 8.9–10.3)
Chloride: 106 mmol/L (ref 101–111)
Creatinine, Ser: 1.92 mg/dL — ABNORMAL HIGH (ref 0.61–1.24)
GFR calc Af Amer: 39 mL/min — ABNORMAL LOW (ref >60)
GFR calc non Af Amer: 34 mL/min — ABNORMAL LOW (ref >60)
Glucose, Bld: 90 mg/dL (ref 65–99)
Potassium: 4.7 mmol/L (ref 3.5–5.1)
Sodium: 137 mmol/L (ref 135–145)
Total Bilirubin: 0.5 mg/dL (ref 0.3–1.2)
Total Protein: 7.6 g/dL (ref 6.5–8.1)

## 2016-07-30 LAB — CBC WITH DIFFERENTIAL/PLATELET
Band Neutrophils: 4 %
Basophils Absolute: 0.3 10*3/uL — ABNORMAL HIGH (ref 0–0.1)
Basophils Relative: 2 %
Eosinophils Absolute: 0.2 10*3/uL (ref 0–0.7)
Eosinophils Relative: 1 %
HCT: 32 % — ABNORMAL LOW (ref 40.0–52.0)
Hemoglobin: 10.1 g/dL — ABNORMAL LOW (ref 13.0–18.0)
Lymphocytes Relative: 9 %
Lymphs Abs: 1.6 10*3/uL (ref 1.0–3.6)
MCH: 21.5 pg — ABNORMAL LOW (ref 26.0–34.0)
MCHC: 31.4 g/dL — ABNORMAL LOW (ref 32.0–36.0)
MCV: 68.4 fL — ABNORMAL LOW (ref 80.0–100.0)
Monocytes Absolute: 2.1 10*3/uL — ABNORMAL HIGH (ref 0.2–1.0)
Monocytes Relative: 12 %
Myelocytes: 4 %
Neutro Abs: 13.2 10*3/uL — ABNORMAL HIGH (ref 1.4–6.5)
Neutrophils Relative %: 68 %
Platelets: 471 10*3/uL — ABNORMAL HIGH (ref 150–440)
RBC: 4.68 MIL/uL (ref 4.40–5.90)
RDW: 36 % — ABNORMAL HIGH (ref 11.5–14.5)
WBC: 17.4 10*3/uL — ABNORMAL HIGH (ref 3.8–10.6)

## 2016-07-30 LAB — LACTATE DEHYDROGENASE: LDH: 663 U/L — ABNORMAL HIGH (ref 98–192)

## 2016-08-01 ENCOUNTER — Ambulatory Visit: Payer: Medicare Other | Admitting: Urology

## 2016-08-01 LAB — CULTURE, URINE COMPREHENSIVE

## 2016-08-04 ENCOUNTER — Ambulatory Visit
Admission: RE | Admit: 2016-08-04 | Discharge: 2016-08-04 | Disposition: A | Discharge status: Home / Self Care | Payer: Medicare Other | Source: Ambulatory Visit | Attending: Urology | Admitting: Urology

## 2016-08-04 DIAGNOSIS — D469 Myelodysplastic syndrome, unspecified: Secondary | ICD-10-CM | POA: Diagnosis not present

## 2016-08-04 DIAGNOSIS — R339 Retention of urine, unspecified: Secondary | ICD-10-CM | POA: Insufficient documentation

## 2016-08-04 DIAGNOSIS — D509 Iron deficiency anemia, unspecified: Secondary | ICD-10-CM | POA: Diagnosis not present

## 2016-08-04 DIAGNOSIS — N2889 Other specified disorders of kidney and ureter: Secondary | ICD-10-CM | POA: Insufficient documentation

## 2016-08-04 DIAGNOSIS — M25561 Pain in right knee: Secondary | ICD-10-CM | POA: Diagnosis not present

## 2016-08-04 DIAGNOSIS — M25571 Pain in right ankle and joints of right foot: Secondary | ICD-10-CM | POA: Diagnosis not present

## 2016-08-04 DIAGNOSIS — N1330 Unspecified hydronephrosis: Secondary | ICD-10-CM | POA: Diagnosis not present

## 2016-08-04 DIAGNOSIS — Z86010 Personal history of colonic polyps: Secondary | ICD-10-CM | POA: Diagnosis not present

## 2016-08-04 DIAGNOSIS — Z79899 Other long term (current) drug therapy: Secondary | ICD-10-CM | POA: Diagnosis not present

## 2016-08-04 DIAGNOSIS — K5090 Crohn's disease, unspecified, without complications: Secondary | ICD-10-CM | POA: Diagnosis not present

## 2016-08-04 DIAGNOSIS — M25562 Pain in left knee: Secondary | ICD-10-CM | POA: Diagnosis not present

## 2016-08-04 DIAGNOSIS — D45 Polycythemia vera: Secondary | ICD-10-CM | POA: Diagnosis not present

## 2016-08-04 DIAGNOSIS — H918X9 Other specified hearing loss, unspecified ear: Secondary | ICD-10-CM | POA: Diagnosis not present

## 2016-08-04 DIAGNOSIS — D7581 Myelofibrosis: Secondary | ICD-10-CM | POA: Diagnosis not present

## 2016-08-04 DIAGNOSIS — Z87891 Personal history of nicotine dependence: Secondary | ICD-10-CM | POA: Diagnosis not present

## 2016-08-04 DIAGNOSIS — N183 Chronic kidney disease, stage 3 (moderate): Secondary | ICD-10-CM | POA: Diagnosis not present

## 2016-08-04 DIAGNOSIS — M25572 Pain in left ankle and joints of left foot: Secondary | ICD-10-CM | POA: Diagnosis not present

## 2016-08-04 DIAGNOSIS — H409 Unspecified glaucoma: Secondary | ICD-10-CM | POA: Diagnosis not present

## 2016-08-06 ENCOUNTER — Ambulatory Visit: Payer: Medicare Other | Admitting: Urology

## 2016-08-07 ENCOUNTER — Other Ambulatory Visit: Payer: Self-pay

## 2016-08-07 DIAGNOSIS — R339 Retention of urine, unspecified: Secondary | ICD-10-CM

## 2016-08-08 ENCOUNTER — Other Ambulatory Visit
Admission: RE | Admit: 2016-08-08 | Discharge: 2016-08-08 | Disposition: A | Discharge status: Home / Self Care | Payer: Medicare Other | Source: Ambulatory Visit | Attending: Urology | Admitting: Urology

## 2016-08-08 ENCOUNTER — Ambulatory Visit (INDEPENDENT_AMBULATORY_CARE_PROVIDER_SITE_OTHER): Payer: Medicare Other | Admitting: Urology

## 2016-08-08 VITALS — BP 133/73 | HR 84 | Ht 72.0 in | Wt 175.0 lb

## 2016-08-08 DIAGNOSIS — R339 Retention of urine, unspecified: Secondary | ICD-10-CM

## 2016-08-08 DIAGNOSIS — N183 Chronic kidney disease, stage 3 unspecified: Secondary | ICD-10-CM

## 2016-08-08 DIAGNOSIS — N1330 Unspecified hydronephrosis: Secondary | ICD-10-CM | POA: Diagnosis not present

## 2016-08-08 DIAGNOSIS — N138 Other obstructive and reflux uropathy: Secondary | ICD-10-CM

## 2016-08-08 DIAGNOSIS — N401 Enlarged prostate with lower urinary tract symptoms: Secondary | ICD-10-CM

## 2016-08-08 LAB — BASIC METABOLIC PANEL
ANION GAP: 8 (ref 5–15)
BUN: 35 mg/dL — AB (ref 6–20)
CALCIUM: 9.2 mg/dL (ref 8.9–10.3)
CO2: 23 mmol/L (ref 22–32)
Chloride: 104 mmol/L (ref 101–111)
Creatinine, Ser: 1.63 mg/dL — ABNORMAL HIGH (ref 0.61–1.24)
GFR calc Af Amer: 48 mL/min — ABNORMAL LOW (ref >60)
GFR, EST NON AFRICAN AMERICAN: 41 mL/min — AB (ref >60)
GLUCOSE: 101 mg/dL — AB (ref 65–99)
POTASSIUM: 5 mmol/L (ref 3.5–5.1)
Sodium: 135 mmol/L (ref 135–145)

## 2016-08-08 MED ORDER — FINASTERIDE 5 MG PO TABS: 5.0000 mg | ORAL_TABLET | Freq: Every day | ORAL | 3 refills | Status: DC

## 2016-08-08 NOTE — Progress Notes (Signed)
Catheter Removal  Patient is present today for a catheter removal.  49m of water was drained from the balloon. A 18FR coude foley cath was removed from the bladder no complications were noted . Patient tolerated well.  Preformed by: SFonnie Jarvis CMA   Continuous Intermittent Catheterization  Due to urinary retention patient is present today for a teaching of self I & O Catheterization. Patient was given detailed verbal and printed instructions of self catheterization. Patient was cleaned and prepped in a sterile fashion.  With instruction and assistance patient inserted a 14FR coude and urine return was noted 25 ml, urine was yellow in color. Patient tolerated well, no complications were noted Patient was given a sample bag with supplies to take home.  Instructions were given per Dr BErlene Quanfor patient to cath 3-4 times daily.  An order was placed with [facility] for catheters to be sent to the patient's home. Patient is to follow up 1 month. If patient is able to urinate on his own he will keep a bladder diary with residual volumes and bring to follow up for re-evaluation.  Preformed by: SFonnie Jarvis CMA  Additional Notes: 1 month

## 2016-08-08 NOTE — Progress Notes (Signed)
08/08/2016 5:15 PM   Janett Labella 05/30/1946 [id number]  Referring provider: [name], MD [address] [place], [place] [address]  Chief Complaint  Patient presents with  . Urinary Retention    HPI: 70 year old male with history of polycythemia vera, Crohn's diease who presented with massive chronic urinary retention (2 L in his bladder) who underwent Foley catheter placement on 08/07/2016. He returns to the office today for Foley catheter removal.   He underwent a renal bladder ultrasound ordered by Dr. Mike Gip on 07/28/16 which demonstrated bilateral hydronephrosis, right greater than left with massively distended bladder, calculated residual of 1370 cc. He was also noted to have upward trending creatinine, 0. 97 back in 2013 which is increased to 1.52 in March and slowly up to 1.87 most recently on 07/23/2016.  He presented to our office that same day at which time a Foley catheter was placed and 2 L history from his bladder.   At that point, his bladder was distended and he is only mildly uncomfortable.  Today, follow-up renal ultrasound shows dramatic improvement of his bilateral hydronephrosis with only trace right residual caliectasis with bilateral parenchymal echogenicity. His creatinine is also improved slightly today to 1.63. He's been tolerating the catheter but is anxious to have it removed.  Most recent PSA 2.02 on 07/09/2016.  He does report that over the last year or so, he's had increasing issues urinating. He reports that he has not been able to fully empty is bladder and often return to the bathroom shortly after voiding 2 to incomplete bladder emptying. He gets up 2-4 times nightly to void. No incontinence or leakage. His stream is also weak.  He did see a urologist about 15 years ago for elevated PSA. He underwent prostate biopsy which was negative. His PSA has since normalized.     PMH: Past Medical History:  Diagnosis Date  . BPH  (benign prostatic hyperplasia)   . Crohn disease ([place])   . Crohn's disease ([place])   . Glaucoma   . Glaucoma   . Gout   . History of chicken pox   . Right bundle branch block     Surgical History: Past Surgical History:  Procedure Laterality Date  . PROSTATE SURGERY  2008   Prostate Biopsy in [place] to elevated PSA.  per patient normal  . Skin Lesion Basal cell removed    . wart removal     from eyelid    Home Medications:    Medication List       Accurate as of 08/08/16  5:15 PM. Always use your most recent med list.          allopurinol 300 MG tablet Commonly known as:  ZYLOPRIM TAKE 1 TABLET (300 MG TOTAL) BY MOUTH DAILY.   aspirin 81 MG tablet Take 81 mg by mouth daily.   CENTRUM SILVER ULTRA MENS Tabs Take by mouth. Reported on 01/30/2016   ferrous sulfate 325 (65 FE) MG tablet Take 325 mg by mouth 3 (three) times a week.   finasteride 5 MG tablet Commonly known as:  PROSCAR Take 1 tablet (5 mg total) by mouth daily.   folic acid 1 MG tablet Commonly known as:  FOLVITE TAKE 1 TABLET (1,000 MCG TOTAL) BY MOUTH ONCE DAILY. MUST KEEP APPT FOR FURTHER REFILLS.   KP FISH OIL 1200 MG Caps Take by mouth.   latanoprost 0.005 % ophthalmic solution Commonly known as:  XALATAN Apply 1 drop to eye daily.  RA VITAMIN B-12 TR 1000 MCG Tbcr Generic drug:  Cyanocobalamin   ruxolitinib phosphate 20 MG tablet Commonly known as:  JAKAFI Take 1 tablet (20 mg total) by mouth 2 (two) times daily.   tamsulosin 0.4 MG Caps capsule Commonly known as:  FLOMAX Take 1 capsule (0.4 mg total) by mouth daily.   timolol 0.25 % ophthalmic solution Commonly known as:  BETIMOL Place 1 drop into both eyes daily.       Allergies: No Known Allergies  Family History: Family History  Problem Relation Age of Onset  . Cancer Mother     Liver  . Cancer Father     Prostate  . Cancer Sister     Hodgkins lymphoma  . Cancer Paternal Aunt     Breast    Social  History:  reports that he quit smoking about 12 years ago. His smoking use included Cigarettes. He has a 5.00 pack-year smoking history. He does not have any smokeless tobacco history on file. He reports that he drinks about 1.2 oz of alcohol per week . He reports that he does not use drugs.  ROS: UROLOGY Frequent Urination?: No Hard to postpone urination?: No Burning/pain with urination?: No Get up at night to urinate?: No Leakage of urine?: No Urine stream starts and stops?: No Trouble starting stream?: No Do you have to strain to urinate?: No Blood in urine?: No Urinary tract infection?: No Sexually transmitted disease?: No Injury to kidneys or bladder?: No Painful intercourse?: No Weak stream?: No Erection problems?: No Penile pain?: No  Gastrointestinal Nausea?: No Vomiting?: No Indigestion/heartburn?: No Constipation?: No  Constitutional Fever: No Night sweats?: No Weight loss?: No Fatigue?: No  Skin Skin rash/lesions?: No Itching?: No  Eyes Blurred vision?: No Double vision?: No  Ears/Nose/Throat Sore throat?: No Sinus problems?: No  Hematologic/Lymphatic Swollen glands?: No Easy bruising?: No  Cardiovascular Leg swelling?: No Chest pain?: No  Respiratory Cough?: No Shortness of breath?: No  Endocrine Excessive thirst?: No  Musculoskeletal Back pain?: No Joint pain?: No  Neurological Headaches?: No Dizziness?: No  Psychologic Depression?: No Anxiety?: No  Physical Exam: BP 133/73   Pulse 84   Ht 6' (1.829 m)   Wt 175 lb (79.4 kg)   BMI 23.73 kg/m   Constitutional:  Alert and oriented, No acute distress.  Wife present today. HEENT: [place] AT, moist mucus membranes.  Trachea midline, no masses. Cardiovascular: No clubbing, cyanosis, or edema. Respiratory: Normal respiratory effort, no increased work of breathing. GI: Abdomen is soft, nontender.   Rectal: Normal sphincter tone. Enlarged 50+ cc prostate, no obvious nodules,  nontender. Skin: No rashes, bruises or suspicious lesions. Lymph: No cervical or inguinal adenopathy. Neurologic: Grossly intact, no focal deficits, moving all 4 extremities. Psychiatric: Normal mood and affect.  Laboratory Data: Lab Results  Component Value Date   WBC 17.4 (H) 07/30/2016   HGB 10.1 (L) 07/30/2016   HCT 32.0 (L) 07/30/2016   MCV 68.4 (L) 07/30/2016   PLT 471 (H) 07/30/2016    Lab Results  Component Value Date   CREATININE 1.63 (H) 08/08/2016    Lab Results  Component Value Date   PSA 2.02 07/09/2016   PSA 2.1 04/10/2014    Urinalysis Results for orders placed or performed in visit on 07/30/16  CBC with Differential  Result Value Ref Range   WBC 17.4 (H) 3.8 - 10.6 K/uL   RBC 4.68 4.40 - 5.90 MIL/uL   Hemoglobin 10.1 (L) 13.0 - 18.0 g/dL  HCT 32.0 (L) 40.0 - 52.0 %   MCV 68.4 (L) 80.0 - 100.0 fL   MCH 21.5 (L) 26.0 - 34.0 pg   MCHC 31.4 (L) 32.0 - 36.0 g/dL   RDW 36.0 (H) 11.5 - 14.5 %   Platelets 471 (H) 150 - 440 K/uL   Neutrophils Relative % 68 %   Lymphocytes Relative 9 %   Monocytes Relative 12 %   Eosinophils Relative 1 %   Basophils Relative 2 %   Band Neutrophils 4 %   Myelocytes 4 %   Neutro Abs 13.2 (H) 1.4 - 6.5 K/uL   Lymphs Abs 1.6 1.0 - 3.6 K/uL   Monocytes Absolute 2.1 (H) 0.2 - 1.0 K/uL   Eosinophils Absolute 0.2 0 - 0.7 K/uL   Basophils Absolute 0.3 (H) 0 - 0.1 K/uL  Comprehensive metabolic panel  Result Value Ref Range   Sodium 137 135 - 145 mmol/L   Potassium 4.7 3.5 - 5.1 mmol/L   Chloride 106 101 - 111 mmol/L   CO2 25 22 - 32 mmol/L   Glucose, Bld 90 65 - 99 mg/dL   BUN 37 (H) 6 - 20 mg/dL   Creatinine, Ser 1.92 (H) 0.61 - 1.24 mg/dL   Calcium 9.1 8.9 - 10.3 mg/dL   Total Protein 7.6 6.5 - 8.1 g/dL   Albumin 4.0 3.5 - 5.0 g/dL   AST 35 15 - 41 U/L   ALT 15 (L) 17 - 63 U/L   Alkaline Phosphatase 69 38 - 126 U/L   Total Bilirubin 0.5 0.3 - 1.2 mg/dL   GFR calc non Af Amer 34 (L) >60 mL/min   GFR calc Af Amer 39  (L) >60 mL/min   Anion gap 6 5 - 15  Lactate dehydrogenase  Result Value Ref Range   LDH 663 (H) 98 - 192 U/L    Pertinent Imaging: CLINICAL DATA:  Urinary retention.  Follow-up.  EXAM: RENAL / URINARY TRACT ULTRASOUND COMPLETE  COMPARISON:  None.  FINDINGS: Right Kidney:  Length: 11.2 cm. Mild residual caliectasis. Increased parenchymal echogenicity. No mass or hydronephrosis visualized.  Left Kidney:  Length: 12.0 cm. Increased parenchymal echogenicity. No mass or hydronephrosis visualized.  Bladder:  Appears normal for degree of bladder distention.  IMPRESSION: 1. Bilateral increased parenchymal echogenicity. 2. Mild residual right-sided caliectasis   Electronically Signed   By: Kerby Moors M.D.   On: 08/04/2016 10:18  RUS personally reviewed today and with the patient and compared to previous renal ultrasound on 07/28/2016.   Assessment & Plan:    1. Urinary retention Presenting with 2000 L in bladder upon Foley placement, bilateral hydroureteronephrosis Lengthy discussion today about bladder management moving forward- suspect urinary retention long-standing/ chronic Differential diagnosis for massive urinary retention discussed, neurogenic bladder versus outlet obstruction or combination Foley catheter was removed today and patient was taught clean intermittent catheterization I have advised him that I would like him to self catheterize at least 3-4 times daily and keep the voiding diary recording spontaneous urine output volumes and catheterized volumes for post void residuals, ideally, keep bladder volume 500 cc or less Clean intermittent catheterization  indefinite for the time being We'll reassess in 4 weeks with review voiding diary Patient will likely need urodynamics depending on above to determine role of outlet procedure moving forward  2. Bilateral hydronephrosis Resolved with Foley catheter placement  3. CKD (chronic kidney  disease) stage 3, GFR 30-59 ml/min Slight improvement with Foley catheter, creatinine 1.6 down from 1.8  4. BPH with obstruction/lower urinary tract symptoms Continue Flomax Start finasteride in addition to above   Return in about 4 weeks (around 09/05/2016) for voiding diary review.  Hollice Espy, MD  [facility] [address], [place], [place] [address] [phone]

## 2016-08-10 ENCOUNTER — Other Ambulatory Visit: Payer: Self-pay | Admitting: Hematology and Oncology

## 2016-08-18 ENCOUNTER — Telehealth: Payer: Self-pay

## 2016-08-18 ENCOUNTER — Other Ambulatory Visit: Payer: Self-pay | Admitting: Hematology and Oncology

## 2016-08-18 NOTE — Telephone Encounter (Signed)
Pt requires a coude catheter due to inability to pass a straight catheter.

## 2016-08-19 ENCOUNTER — Other Ambulatory Visit: Payer: Self-pay | Admitting: *Deleted

## 2016-08-19 DIAGNOSIS — D45 Polycythemia vera: Secondary | ICD-10-CM

## 2016-08-20 ENCOUNTER — Inpatient Hospital Stay: Payer: Medicare Other

## 2016-08-20 ENCOUNTER — Encounter: Payer: Self-pay | Admitting: Hematology and Oncology

## 2016-08-20 ENCOUNTER — Inpatient Hospital Stay (HOSPITAL_BASED_OUTPATIENT_CLINIC_OR_DEPARTMENT_OTHER): Payer: Medicare Other | Admitting: Hematology and Oncology

## 2016-08-20 VITALS — BP 108/62 | HR 80 | Temp 97.7°F | Wt 173.5 lb

## 2016-08-20 DIAGNOSIS — N189 Chronic kidney disease, unspecified: Secondary | ICD-10-CM

## 2016-08-20 DIAGNOSIS — D7581 Myelofibrosis: Secondary | ICD-10-CM

## 2016-08-20 DIAGNOSIS — N1330 Unspecified hydronephrosis: Secondary | ICD-10-CM

## 2016-08-20 DIAGNOSIS — Z79899 Other long term (current) drug therapy: Secondary | ICD-10-CM

## 2016-08-20 DIAGNOSIS — R718 Other abnormality of red blood cells: Secondary | ICD-10-CM | POA: Diagnosis not present

## 2016-08-20 DIAGNOSIS — Z7982 Long term (current) use of aspirin: Secondary | ICD-10-CM

## 2016-08-20 DIAGNOSIS — I4510 Unspecified right bundle-branch block: Secondary | ICD-10-CM

## 2016-08-20 DIAGNOSIS — N400 Enlarged prostate without lower urinary tract symptoms: Secondary | ICD-10-CM

## 2016-08-20 DIAGNOSIS — F17210 Nicotine dependence, cigarettes, uncomplicated: Secondary | ICD-10-CM

## 2016-08-20 DIAGNOSIS — Z86010 Personal history of colonic polyps: Secondary | ICD-10-CM

## 2016-08-20 DIAGNOSIS — N289 Disorder of kidney and ureter, unspecified: Secondary | ICD-10-CM

## 2016-08-20 DIAGNOSIS — N401 Enlarged prostate with lower urinary tract symptoms: Secondary | ICD-10-CM

## 2016-08-20 DIAGNOSIS — D649 Anemia, unspecified: Secondary | ICD-10-CM

## 2016-08-20 DIAGNOSIS — D45 Polycythemia vera: Secondary | ICD-10-CM

## 2016-08-20 DIAGNOSIS — K5090 Crohn's disease, unspecified, without complications: Secondary | ICD-10-CM

## 2016-08-20 DIAGNOSIS — M129 Arthropathy, unspecified: Secondary | ICD-10-CM

## 2016-08-20 DIAGNOSIS — Z800 Family history of malignant neoplasm of digestive organs: Secondary | ICD-10-CM

## 2016-08-20 LAB — COMPREHENSIVE METABOLIC PANEL
ALT: 16 U/L — ABNORMAL LOW (ref 17–63)
AST: 35 U/L (ref 15–41)
Albumin: 3.8 g/dL (ref 3.5–5.0)
Alkaline Phosphatase: 74 U/L (ref 38–126)
Anion gap: 8 (ref 5–15)
BUN: 53 mg/dL — ABNORMAL HIGH (ref 6–20)
CO2: 22 mmol/L (ref 22–32)
Calcium: 9.1 mg/dL (ref 8.9–10.3)
Chloride: 106 mmol/L (ref 101–111)
Creatinine, Ser: 2.04 mg/dL — ABNORMAL HIGH (ref 0.61–1.24)
GFR calc Af Amer: 37 mL/min — ABNORMAL LOW (ref >60)
GFR calc non Af Amer: 32 mL/min — ABNORMAL LOW (ref >60)
Glucose, Bld: 101 mg/dL — ABNORMAL HIGH (ref 65–99)
Potassium: 5 mmol/L (ref 3.5–5.1)
Sodium: 136 mmol/L (ref 135–145)
Total Bilirubin: 0.5 mg/dL (ref 0.3–1.2)
Total Protein: 7.7 g/dL (ref 6.5–8.1)

## 2016-08-20 LAB — CBC WITH DIFFERENTIAL/PLATELET
Band Neutrophils: 2 %
Basophils Absolute: 0.3 10*3/uL — ABNORMAL HIGH (ref 0–0.1)
Basophils Relative: 1 %
Blasts: 0 %
Eosinophils Absolute: 0 10*3/uL (ref 0–0.7)
Eosinophils Relative: 0 %
HCT: 33.1 % — ABNORMAL LOW (ref 40.0–52.0)
Hemoglobin: 10.3 g/dL — ABNORMAL LOW (ref 13.0–18.0)
Lymphocytes Relative: 4 %
Lymphs Abs: 1.1 10*3/uL (ref 1.0–3.6)
MCH: 22.5 pg — ABNORMAL LOW (ref 26.0–34.0)
MCHC: 31.2 g/dL — ABNORMAL LOW (ref 32.0–36.0)
MCV: 71.9 fL — ABNORMAL LOW (ref 80.0–100.0)
Metamyelocytes Relative: 0 %
Monocytes Absolute: 4.8 10*3/uL — ABNORMAL HIGH (ref 0.2–1.0)
Monocytes Relative: 18 %
Myelocytes: 0 %
Neutro Abs: 20.6 10*3/uL — ABNORMAL HIGH (ref 1.4–6.5)
Neutrophils Relative %: 75 %
Other: 0 %
Platelets: 418 10*3/uL (ref 150–440)
Promyelocytes Absolute: 0 %
RBC: 4.61 MIL/uL (ref 4.40–5.90)
RDW: 32 % — ABNORMAL HIGH (ref 11.5–14.5)
WBC: 26.8 10*3/uL — ABNORMAL HIGH (ref 3.8–10.6)
nRBC: 0 /100 WBC

## 2016-08-20 LAB — LIPID PANEL
Cholesterol: 100 mg/dL (ref 0–200)
HDL: 32 mg/dL — ABNORMAL LOW (ref >40)
LDL Cholesterol: 31 mg/dL (ref 0–99)
Total CHOL/HDL Ratio: 3.1 RATIO
Triglycerides: 183 mg/dL — ABNORMAL HIGH (ref <150)
VLDL: 37 mg/dL (ref 0–40)

## 2016-08-20 LAB — LACTATE DEHYDROGENASE: LDH: 636 U/L — ABNORMAL HIGH (ref 98–192)

## 2016-08-20 LAB — URIC ACID: Uric Acid, Serum: 5.9 mg/dL (ref 4.4–7.6)

## 2016-08-20 NOTE — Progress Notes (Signed)
[facility] day:  08/20/2016  Chief Complaint: Mercer Peifer is a 70 y.o. male with polycythemia rubra vera (PV) who is seen for 4 week assessment on Jakafi (ruxolitinib).  HPI: The patient was last seen in the medical oncology clinic on 07/23/2016.  At that time,  he felt good.  Arthritis pain was minimal.  He noted ongoing bladder emptying issues.  Exam was stable.  Counts included a hematocrit 31.5, platelets 450,000 and WBC 15,300.  Creatinine was 1.87.  He continued Jakafi 20 mg BID.  He was referred for renal ultrasound.  Renal ultrasound on 07/28/2016 revealed bilateral hydronephrosis right worse than left and a large postvoid residual suggesting bladder dysfunction or outlet obstruction.   He was seen by Dr. Hollice Espy on 07/28/2016.  Foley catheter was placed. He had 2 liters of urine in his bladder.  He was started on Flomax.  He was seen in follow-up on 08/08/2016.  Follow-up renal ultrasound revealed improvement in bilateral hydronephrosis.  Creatinine was 1.67.  Foley catheter was removed.  He was instructed on self catheterization 3-4 times a day.  A voiding diary was requested.  Finasteride was added.  He saw Dr. Evelene Croon on 08/04/2016.  He was slightly more anemic, hgb 9.9 g/dL but asymptomatic. Recommendation was to continue ruxolitinib dose at 61m po BID. If hemoglobin continues to trend down; consider reducing dose to 163mpo BID.  Plan per protocol was to check lipids 12 weeks after starting Jakafi.  Herpes zoster vaccine was not recommended.  Inactivated influenza vaccine was recommended.  Annual dermatologic exam was discussed.  He has a follow-up appointment in 4-6 months.  CBC on 07/30/2016 revealed a hematocrit of 32.0, hemoglobin 10.1, platelets 471,000, WBC 17,400 with an ANC of 13,200.  Symptomatically, he denies any new complaints.  He denies any bruising or bleeding.  He is self catheterizing 4 times a day.  His  voiding diary is usually 300-400 cc with each catheterization during the day and 800 cc in the morning on awakening.    Past Medical History:  Diagnosis Date  . BPH (benign prostatic hyperplasia)   . [place] disease [place]  . Cro[place]'s disease ([place]  . Glaucoma   . Glaucoma   . Gout   . History of chicken pox   . Right bundle branch block     Past Surgical History:  Procedure Laterality Date  . PROSTATE SURGERY  2008   Prostate Biopsy in [place] to elevated PSA.  per patient normal  . Skin Lesion Basal cell removed    . wart removal     from eyelid    Family History  Problem Relation Age of Onset  . Cancer Mother     Liver  . Cancer Father     Prostate  . Cancer Sister     Hodgkins lymphoma  . Cancer Paternal Aunt     Breast    Social History:  reports that he quit smoking about 12 years ago. His smoking use included Cigarettes. He has a 5.00 pack-year smoking history. He does not have any smokeless tobacco history on file. He reports that he drinks about 1.2 oz of alcohol per week . He reports that he does not use drugs.  He previously worked for [employer] He volunteers for [employer].  [place]  The patient is accompanied by his wife, JuCharlett Nosetoday.  Allergies:  No Known Allergies  Current Medications: Current Outpatient Prescriptions  Medication Sig Dispense Refill  . allopurinol (ZYLOPRIM) 300 MG tablet TAKE 1 TABLET (300 MG TOTAL) BY MOUTH DAILY. 30 tablet 3  . aspirin 81 MG tablet Take 81 mg by mouth daily.    . Cyanocobalamin (RA VITAMIN B-12 TR) 1000 MCG TBCR     . ferrous sulfate 325 (65 FE) MG tablet Take 325 mg by mouth 3 (three) times a week.    . finasteride (PROSCAR) 5 MG tablet Take 1 tablet (5 mg total) by mouth daily. 90 tablet 3  . folic acid (FOLVITE) 1 MG tablet TAKE 1 TABLET (1,000 MCG TOTAL) BY MOUTH ONCE DAILY. MUST KEEP APPT FOR FURTHER REFILLS.    Marland Kitchen latanoprost (XALATAN)  0.005 % ophthalmic solution Apply 1 drop to eye daily.     . Multiple Vitamins-Minerals (CENTRUM SILVER ULTRA MENS) TABS Take by mouth. Reported on 01/30/2016    . Omega-3 Fatty Acids (KP FISH OIL) 1200 MG CAPS Take by mouth.    . ruxolitinib phosphate (JAKAFI) 20 MG tablet Take 1 tablet (20 mg total) by mouth 2 (two) times daily. 60 tablet 3  . tamsulosin (FLOMAX) 0.4 MG CAPS capsule Take 1 capsule (0.4 mg total) by mouth daily. 30 capsule 11  . timolol (BETIMOL) 0.25 % ophthalmic solution Place 1 drop into both eyes daily.      No current facility-administered medications for this visit.     Review of Systems:  GENERAL:  Feels fine.  No fevers or sweats.  Weight down 6 pounds. PERFORMANCE STATUS (ECOG):  1 HEENT:  No visual changes, runny nose, sore throat, mouth sores or tenderness. Lungs: No shortness of breath or cough.  No hemoptysis. Cardiac:  No chest pain, palpitations, orthopnea, or PND. GI:  Diarrhea (chronic).  No nausea, vomiting, constipation, melena or hematochezia. GU:  Self catheterization 4x/day.  Typical volume 300-400 cc (800 cc in AM).  No urgency, dysuria, or hematuria. Musculoskeletal:  No back pain.  Knee and ankle, improved.  No muscle tenderness. Extremities:  Tendonitis in ankles.  Joints feel better since initiation of treatment.  No swelling. Skin:  No rashes or skin changes. Neuro:  No headache, numbness or weakness, balance or coordination issues. Endocrine:  No diabetes, thyroid issues, hot flashes or night sweats. Psych:  No mood changes, depression or anxiety. Pain:  Joint pain, improved. Review of systems:  All other systems reviewed and found to be negative.   Physical Exam: Blood pressure 108/62, pulse 80, temperature 97.7 F (36.5 C), temperature source Tympanic, weight 173 lb 8 oz (78.7 kg). GENERAL:  Well developed, well nourished, gentleman sitting comfortably in the exam room in no acute distress. MENTAL STATUS:  Alert and oriented to person,  place and time. HEAD:  Pearline Cables hair.  Normocephalic, atraumatic, face symmetric, no Cushingoid features. EYES: Glasses. Blue eyes. Pupils equal round and reactive to light and accomodation.  No conjunctivitis or scleral icterus. ENT: Oropharynx clear without lesion. Tongue normal. Mucous membranes moist.  RESPIRATORY: Clear to auscultation without rales, wheezes or rhonchi. CARDIOVASCULAR: Regular rate and rhythm without murmur, rub or gallop. ABDOMEN: Soft, non-tender, with active bowel sounds, and no appreciable hepatosplenomegaly.  No masses.  SKIN: No rashes, ulcers or lesions. EXTREMITIES: No edema, no skin discoloration or tenderness. No palpable cords. LYMPH NODES: No palpable cervical, supraclavicular, axillary or inguinal adenopathy  NEUROLOGICAL: Unremarkable. PSYCH: Appropriate.    Appointment on 08/20/2016  Component Date Value Ref Range Status  . WBC  08/20/2016 26.8* 3.8 - 10.6 K/uL Final  . RBC 08/20/2016 4.61  4.40 - 5.90 MIL/uL Final  . Hemoglobin 08/20/2016 10.3* 13.0 - 18.0 g/dL Final  . HCT 08/20/2016 33.1* 40.0 - 52.0 % Final  . MCV 08/20/2016 71.9* 80.0 - 100.0 fL Final  . MCH 08/20/2016 22.5* 26.0 - 34.0 pg Final  . MCHC 08/20/2016 31.2* 32.0 - 36.0 g/dL Final  . RDW 08/20/2016 32.0* 11.5 - 14.5 % Final  . Platelets 08/20/2016 418  150 - 440 K/uL Final  . Neutrophils Relative % 08/20/2016 PENDING  % Incomplete  . Neutro Abs 08/20/2016 PENDING  1.7 - 7.7 K/uL Incomplete  . Band Neutrophils 08/20/2016 PENDING  % Incomplete  . Lymphocytes Relative 08/20/2016 PENDING  % Incomplete  . Lymphs Abs 08/20/2016 PENDING  0.7 - 4.0 K/uL Incomplete  . Monocytes Relative 08/20/2016 PENDING  % Incomplete  . Monocytes Absolute 08/20/2016 PENDING  0.1 - 1.0 K/uL Incomplete  . Eosinophils Relative 08/20/2016 PENDING  % Incomplete  . Eosinophils Absolute 08/20/2016 PENDING  0.0 - 0.7 K/uL Incomplete  . Basophils Relative 08/20/2016 PENDING  % Incomplete  . Basophils  Absolute 08/20/2016 PENDING  0.0 - 0.1 K/uL Incomplete  . WBC Morphology 08/20/2016 PENDING   Incomplete  . RBC Morphology 08/20/2016 PENDING   Incomplete  . Smear Review 08/20/2016 PENDING   Incomplete  . Other 08/20/2016 PENDING  % Incomplete  . nRBC 08/20/2016 PENDING  0 /100 WBC Incomplete  . Metamyelocytes Relative 08/20/2016 PENDING  % Incomplete  . Myelocytes 08/20/2016 PENDING  % Incomplete  . Promyelocytes Absolute 08/20/2016 PENDING  % Incomplete  . Blasts 08/20/2016 PENDING  % Incomplete  . Sodium 08/20/2016 136  135 - 145 mmol/L Final  . Potassium 08/20/2016 5.0  3.5 - 5.1 mmol/L Final  . Chloride 08/20/2016 106  101 - 111 mmol/L Final  . CO2 08/20/2016 22  22 - 32 mmol/L Final  . Glucose, Bld 08/20/2016 101* 65 - 99 mg/dL Final  . BUN 08/20/2016 53* 6 - 20 mg/dL Final  . Creatinine, Ser 08/20/2016 2.04* 0.61 - 1.24 mg/dL Final  . Calcium 08/20/2016 9.1  8.9 - 10.3 mg/dL Final  . Total Protein 08/20/2016 7.7  6.5 - 8.1 g/dL Final  . Albumin 08/20/2016 3.8  3.5 - 5.0 g/dL Final  . AST 08/20/2016 35  15 - 41 U/L Final  . ALT 08/20/2016 16* 17 - 63 U/L Final  . Alkaline Phosphatase 08/20/2016 74  38 - 126 U/L Final  . Total Bilirubin 08/20/2016 0.5  0.3 - 1.2 mg/dL Final  . GFR calc non Af Amer 08/20/2016 32* >60 mL/min Final  . GFR calc Af Amer 08/20/2016 37* >60 mL/min Final   Comment: (NOTE) The eGFR has been calculated using the CKD EPI equation. This calculation has not been validated in all clinical situations. eGFR's persistently <60 mL/min signify possible Chronic Kidney Disease.   . Anion gap 08/20/2016 8  5 - 15 Final  . LDH 08/20/2016 636* 98 - 192 U/L Final  . Uric Acid, Serum 08/20/2016 5.9  4.4 - 7.6 mg/dL Final    Assessment:  Iver Miklas is a 70 y.o. 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  He has had polycythemia dating back to 2013.  Hematocrit was 62.1 with a hemoglobin of 19.8 on 03/28/2015.  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  Erythropoietin level was 1.1 (low).  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  He began a phlebotomy program on 03/28/2015 to  maintain a hematocrit goal of < 45.  He last underwent phlebotomy on 05/02/2015.  He is on a baby aspirin.  He is followed closely by GI (Dr. Vira Agar) for a history of polyps and Crohn's disease.  Last colonoscopy was 3-4 years ago.  He has become iron deficient on his phlebotomy program (ferritin 21 on 11/28/2015).   CBC on 11/28/2015 revealed a hematocrit of 39.3, hemoglobin 11.9, platelets 463,000, WBC 32,000 with an ANC of 25,000.  Differential included 71% segs, 4% lymphocytes and 17% monocytes.  Peripheral smear revealed leukocytosis with predominantly mature neutrophils, increased monocytes and rare blasts (<1%).  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  Marrow was packed (95-100% cellularity) with pan myelosis, multi-lineage dyspoiesis, and no significant increase in blasts.  There was moderate to focally marked reticulin fibrosis (grade 2-3/3).  Storage iron was not identified.  Flow cytometry revealed non-specific atypical myeloid findings with no increase in blasts.  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  Cytogenetics were normal (46, XY).  B[facility] marrow on 04/07/2016 at [facility] revealed a hypercellular marrow (> 95%) with persistent involvement by myeloid neoplasm with 5% blasts.  There was mild reticulin fibrosis.  FISH t(9;22) results were normal.  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  Cytogenetics are pending.  He was briefly on hydroxyurea 500 mg a day (12/05/2015 - 12/19/2015 and 06/25/2016 - 07/03/2016).   Platelet count increased to 1.18 million and white count to 54,800 without increased blasts on  06/25/2016.  CBC on 07/02/2016 revealed a platelets of 429,000 with a white count 30,600.  He began allopurinol on 12/19/2015.  He began Jakafi 20 mg BID on 05/28/2016.  He has urinary retention.  Renal ultrasound on 07/28/2016 revealed bilateral hydronephrosis (right > left) and a large postvoid residual (2 liters) suggesting bladder dysfunction or outlet obstruction.  He underwent temporary Foley catheter placement.  He performs I/O self catheterizations.  He is on Flomax and finasteride.  PSA was 2.02 on 07/09/2016.  He has chronic renal insufficiency (Cr 1.86; CrCl 35 ml/min; Cr 2.04 today).   Symptomatically, he feels good.  Counts include a hematocrit 33.1, platelets 418,000 and WBC is 26,800.  LDH is 656.  Uric acid is normal.  Plan: 1.  Labs today:  CBC with diff, CMP, LDH, uric acid, lipid panel. 2.  Discuss stability in counts and plan to switch to monthly CBCs. 3.  Continue Jakafi 20 mg BID. 4.  Continue ferrous sulfate 325 mg po q Mondays, Wednesday, Fridays. 5.  Discuss interval urology evaluation and management. 6.  Discuss chronic kidney disease.  Discuss referral to nephrology for long term follow-up. 7.  Nephrology consult 8.  RTC in 1 month for MD assessment and labs (CBC with diff, CMP, LDH).   Lequita Asal, MD  08/20/2016 , 10:37 AM

## 2016-08-20 NOTE — Progress Notes (Signed)
BP sitting 99/59  HR 73.  Standing 108/62 HR 80.  Patient states he is not drinking as much since he is now doing self catherizations.

## 2016-08-21 ENCOUNTER — Encounter: Payer: Self-pay | Admitting: Hematology and Oncology

## 2016-08-21 NOTE — Telephone Encounter (Signed)
Documents faxed to [facility] for consult

## 2016-08-22 ENCOUNTER — Telehealth: Payer: Self-pay | Admitting: *Deleted

## 2016-08-22 NOTE — Telephone Encounter (Signed)
[facility] notified of consult for this pt, voiced that referral was received, [facility] will call pt with appt.

## 2016-08-26 NOTE — Telephone Encounter (Signed)
Got a fax that states pt appt in [place] to see Dr. Holley Raring is 09/23/16 at 11:40. Pt was contacted by nephrology office about the appt.

## 2016-09-03 ENCOUNTER — Ambulatory Visit: Payer: Medicare Other | Admitting: Hematology and Oncology

## 2016-09-03 ENCOUNTER — Other Ambulatory Visit: Payer: Medicare Other

## 2016-09-03 DIAGNOSIS — Z800 Family history of malignant neoplasm of digestive organs: Secondary | ICD-10-CM | POA: Diagnosis not present

## 2016-09-03 DIAGNOSIS — K5090 Crohn's disease, unspecified, without complications: Secondary | ICD-10-CM | POA: Diagnosis not present

## 2016-09-05 ENCOUNTER — Ambulatory Visit (INDEPENDENT_AMBULATORY_CARE_PROVIDER_SITE_OTHER): Payer: Medicare Other | Admitting: Urology

## 2016-09-05 ENCOUNTER — Other Ambulatory Visit
Admission: RE | Admit: 2016-09-05 | Discharge: 2016-09-05 | Disposition: A | Discharge status: Home / Self Care | Payer: Medicare Other | Source: Ambulatory Visit | Attending: Urology | Admitting: Urology

## 2016-09-05 ENCOUNTER — Encounter: Payer: Self-pay | Admitting: Urology

## 2016-09-05 VITALS — BP 120/72 | HR 77 | Ht 72.0 in | Wt 175.0 lb

## 2016-09-05 DIAGNOSIS — N401 Enlarged prostate with lower urinary tract symptoms: Secondary | ICD-10-CM | POA: Diagnosis not present

## 2016-09-05 DIAGNOSIS — R339 Retention of urine, unspecified: Secondary | ICD-10-CM | POA: Diagnosis not present

## 2016-09-05 DIAGNOSIS — N183 Chronic kidney disease, stage 3 unspecified: Secondary | ICD-10-CM

## 2016-09-05 DIAGNOSIS — N138 Other obstructive and reflux uropathy: Secondary | ICD-10-CM | POA: Diagnosis not present

## 2016-09-05 DIAGNOSIS — N1330 Unspecified hydronephrosis: Secondary | ICD-10-CM | POA: Diagnosis not present

## 2016-09-05 LAB — BASIC METABOLIC PANEL
Anion gap: 7 (ref 5–15)
BUN: 41 mg/dL — AB (ref 6–20)
CHLORIDE: 105 mmol/L (ref 101–111)
CO2: 23 mmol/L (ref 22–32)
Calcium: 8.8 mg/dL — ABNORMAL LOW (ref 8.9–10.3)
Creatinine, Ser: 1.75 mg/dL — ABNORMAL HIGH (ref 0.61–1.24)
GFR calc Af Amer: 44 mL/min — ABNORMAL LOW (ref >60)
GFR calc non Af Amer: 38 mL/min — ABNORMAL LOW (ref >60)
Glucose, Bld: 100 mg/dL — ABNORMAL HIGH (ref 65–99)
POTASSIUM: 4.9 mmol/L (ref 3.5–5.1)
SODIUM: 135 mmol/L (ref 135–145)

## 2016-09-05 NOTE — Progress Notes (Signed)
09/05/2016 5:40 PM   Ruben [name] 1946/07/08 [id number]  Referring provider: [name], MD [address] [place], [place], [place] [address]  Chief Complaint  Patient presents with  . Urinary Retention    1 month     HPI: 70 year old male with history of polycythemia vera, Crohn's diease who presented with massive chronic urinary retention (2 L in his bladder) who underwent Foley catheter placement on 08/07/2016 and now has been intermittently catheterizing.   He was also noted to have upward trending creatinine, 0. 97 back in 2013 which is increased to 1.52 in March and slowly up to 1.87 most recently on 07/23/2016.  His creatinine improved slightly with Foley to 1.63 but had risen back up to 2.042 weeks ago. Plan for recheck today.  Follow-up renal ultrasound shows dramatic improvement of his bilateral hydronephrosis with only trace right residual caliectasis with bilateral parenchymal echogenicity.   Most recent PSA 2.02 on 07/09/2016.  Rectal exam showed a benign, enlarged gland without nodules.  He does report that over the last year or so, he's had increasing issues urinating. He reports that he has not been able to fully empty is bladder and often return to the bathroom shortly after voiding 2 to incomplete bladder emptying. He gets up 2-4 times nightly to void. No incontinence or leakage. His stream is also weak.  He did see a urologist about 15 years ago for elevated PSA. He underwent prostate biopsy which was negative. His PSA has since normalized.  He's been self cathetering 4 times a day. In the morning, he will catheter for large volumes as much as 1 L. In the morning, he does have some urinary urgency but not able to generate a urinary stream. Otherwise throughout the day, he's on timed self catheter without any urge or spontaneous voiding. Volumes range from the 200-600 cc range.   PMH: Past Medical History:  Diagnosis Date  . BPH (benign prostatic  hyperplasia)   . Crohn disease ([place])   . Crohn's disease ([place])   . Glaucoma   . Glaucoma   . Gout   . History of chicken pox   . Right bundle branch block     Surgical History: Past Surgical History:  Procedure Laterality Date  . PROSTATE SURGERY  2008   Prostate Biopsy in [place] to elevated PSA.  per patient normal  . Skin Lesion Basal cell removed    . wart removal     from eyelid    Home Medications:  Allergies as of 09/05/2016   No Known Allergies     Medication List       Accurate as of 09/05/16  5:40 PM. Always use your most recent med list.          allopurinol 300 MG tablet Commonly known as:  ZYLOPRIM TAKE 1 TABLET (300 MG TOTAL) BY MOUTH DAILY.   aspirin 81 MG tablet Take 81 mg by mouth daily.   CENTRUM SILVER ULTRA MENS Tabs Take by mouth. Reported on 01/30/2016   ferrous sulfate 325 (65 FE) MG tablet Take 325 mg by mouth 3 (three) times a week.   finasteride 5 MG tablet Commonly known as:  PROSCAR Take 1 tablet (5 mg total) by mouth daily.   folic acid 1 MG tablet Commonly known as:  FOLVITE TAKE 1 TABLET (1,000 MCG TOTAL) BY MOUTH ONCE DAILY. MUST KEEP APPT FOR FURTHER REFILLS.   KP FISH OIL 1200 MG Caps Take by mouth.   latanoprost  0.005 % ophthalmic solution Commonly known as:  XALATAN Apply 1 drop to eye daily.   RA VITAMIN B-12 TR 1000 MCG Tbcr Generic drug:  Cyanocobalamin   ruxolitinib phosphate 20 MG tablet Commonly known as:  JAKAFI Take 1 tablet (20 mg total) by mouth 2 (two) times daily.   tamsulosin 0.4 MG Caps capsule Commonly known as:  FLOMAX Take 1 capsule (0.4 mg total) by mouth daily.   timolol 0.25 % ophthalmic solution Commonly known as:  BETIMOL Place 1 drop into both eyes daily.       Allergies: No Known Allergies  Family History: Family History  Problem Relation Age of Onset  . Cancer Mother     Liver  . Cancer Father     Prostate  . Cancer Sister     Hodgkins lymphoma  . Cancer Paternal  Aunt     Breast    Social History:  reports that he quit smoking about 12 years ago. His smoking use included Cigarettes. He has a 5.00 pack-year smoking history. He does not have any smokeless tobacco history on file. He reports that he drinks about 1.2 oz of alcohol per week . He reports that he does not use drugs.  ROS: UROLOGY Frequent Urination?: Yes Hard to postpone urination?: No Burning/pain with urination?: No Get up at night to urinate?: No Leakage of urine?: No Urine stream starts and stops?: No Trouble starting stream?: No Do you have to strain to urinate?: No Blood in urine?: No Urinary tract infection?: No Sexually transmitted disease?: No Injury to kidneys or bladder?: No Painful intercourse?: No Weak stream?: No Erection problems?: No Penile pain?: No  Gastrointestinal Nausea?: No Vomiting?: No Indigestion/heartburn?: No Diarrhea?: No Constipation?: No  Constitutional Fever: No Night sweats?: No Weight loss?: No Fatigue?: No  Skin Skin rash/lesions?: No Itching?: No  Eyes Blurred vision?: No Double vision?: No  Ears/Nose/Throat Sore throat?: No Sinus problems?: No  Hematologic/Lymphatic Swollen glands?: No Easy bruising?: No  Cardiovascular Leg swelling?: No Chest pain?: No  Respiratory Cough?: No Shortness of breath?: No  Endocrine Excessive thirst?: No  Musculoskeletal Back pain?: No Joint pain?: No  Neurological Headaches?: No Dizziness?: No  Psychologic Depression?: No Anxiety?: No  Physical Exam: BP 120/72   Pulse 77   Ht 6' (1.829 m)   Wt 175 lb (79.4 kg)   BMI 23.73 kg/m   Constitutional:  Alert and oriented, No acute distress.  Wife present today. HEENT: [place] AT, moist mucus membranes.  Trachea midline, no masses. Cardiovascular: No clubbing, cyanosis, or edema. Respiratory: Normal respiratory effort, no increased work of breathing. GI: Abdomen is soft, nontender.   Rectal: Normal sphincter tone. Enlarged  50+ cc prostate, no obvious nodules, nontender. Skin: No rashes, bruises or suspicious lesions. Lymph: No cervical or inguinal adenopathy. Neurologic: Grossly intact, no focal deficits, moving all 4 extremities. Psychiatric: Normal mood and affect.  Laboratory Data: Lab Results  Component Value Date   WBC 26.8 (H) 08/20/2016   HGB 10.3 (L) 08/20/2016   HCT 33.1 (L) 08/20/2016   MCV 71.9 (L) 08/20/2016   PLT 418 08/20/2016    Lab Results  Component Value Date   CREATININE 1.75 (H) 09/05/2016    Lab Results  Component Value Date   PSA 2.02 07/09/2016   PSA 2.1 04/10/2014    Urinalysis Results for orders placed or performed in visit on 52/84/13  Basic metabolic panel  Result Value Ref Range   Sodium 135 135 - 145 mmol/L  Potassium 4.9 3.5 - 5.1 mmol/L   Chloride 105 101 - 111 mmol/L   CO2 23 22 - 32 mmol/L   Glucose, Bld 100 (H) 65 - 99 mg/dL   BUN 41 (H) 6 - 20 mg/dL   Creatinine, Ser 1.75 (H) 0.61 - 1.24 mg/dL   Calcium 8.8 (L) 8.9 - 10.3 mg/dL   GFR calc non Af Amer 38 (L) >60 mL/min   GFR calc Af Amer 44 (L) >60 mL/min   Anion gap 7 5 - 15    Pertinent Imaging: No new imaging   Assessment & Plan:    1. Urinary retention Presenting with 2000 L in bladder upon Foley placement, bilateral hydroureteronephrosis Self cathetering 4 times a day with some large volumes, advised to increase frequency to up to 5-6 times daily keep volumes 500 cc or less Options moving forward discussed in detail. These include proceeding with urodynamics to further assess his overall bladder function, pressures, and appearance. This will give [place] information prognostically about the role of outlet procedure as well as overall bladder function. Ultimately, given that he is currently unable to void and will be unable to perform a pressure flow study, consideration of proceeding with an outlet procedure presumptively was also discussed. He would need a workup with prostate sizing and  cystoscopy for surgical planning purposes. We discussed that it unclear whether his retention will resolve after an outlet procedure as the underlying issue may in fact be related to neurogenic bladder as opposed to outlet obstruction.   2. Bilateral hydronephrosis Resolved with Foley catheter placement  3. CKD (chronic kidney disease) stage 3, GFR 30-59 ml/min Recheck today given upper trending creatinine to 2.04 -BMP  4. BPH with obstruction/lower urinary tract symptoms Continue Flomax and finaseride   He will call us and let us know how he like to proceed.  The meantime, continue self catheter.  Hollice Espy, MD  [facility] [address], [place], [place] [address] [phone]

## 2016-09-08 ENCOUNTER — Telehealth: Payer: Self-pay

## 2016-09-08 NOTE — Telephone Encounter (Signed)
-----   Message from Hollice Espy, MD sent at 09/05/2016  2:48 PM EST ----- Cr improved to 1.75, good news.  Hollice Espy, MD

## 2016-09-08 NOTE — Telephone Encounter (Signed)
LMOM- cr improved.

## 2016-09-23 DIAGNOSIS — N183 Chronic kidney disease, stage 3 (moderate): Secondary | ICD-10-CM | POA: Diagnosis not present

## 2016-09-23 DIAGNOSIS — N1330 Unspecified hydronephrosis: Secondary | ICD-10-CM | POA: Diagnosis not present

## 2016-09-24 ENCOUNTER — Inpatient Hospital Stay (HOSPITAL_BASED_OUTPATIENT_CLINIC_OR_DEPARTMENT_OTHER): Payer: Medicare Other | Admitting: Hematology and Oncology

## 2016-09-24 ENCOUNTER — Inpatient Hospital Stay: Payer: Medicare Other | Attending: Hematology and Oncology

## 2016-09-24 ENCOUNTER — Encounter: Payer: Self-pay | Admitting: Hematology and Oncology

## 2016-09-24 VITALS — BP 120/70 | HR 64 | Temp 97.2°F | Resp 18 | Wt 181.0 lb

## 2016-09-24 DIAGNOSIS — Z800 Family history of malignant neoplasm of digestive organs: Secondary | ICD-10-CM

## 2016-09-24 DIAGNOSIS — Z803 Family history of malignant neoplasm of breast: Secondary | ICD-10-CM | POA: Diagnosis not present

## 2016-09-24 DIAGNOSIS — I4510 Unspecified right bundle-branch block: Secondary | ICD-10-CM

## 2016-09-24 DIAGNOSIS — N1330 Unspecified hydronephrosis: Secondary | ICD-10-CM | POA: Diagnosis not present

## 2016-09-24 DIAGNOSIS — Z7982 Long term (current) use of aspirin: Secondary | ICD-10-CM | POA: Diagnosis not present

## 2016-09-24 DIAGNOSIS — N189 Chronic kidney disease, unspecified: Secondary | ICD-10-CM | POA: Insufficient documentation

## 2016-09-24 DIAGNOSIS — D7581 Myelofibrosis: Secondary | ICD-10-CM

## 2016-09-24 DIAGNOSIS — Z8619 Personal history of other infectious and parasitic diseases: Secondary | ICD-10-CM

## 2016-09-24 DIAGNOSIS — Z87891 Personal history of nicotine dependence: Secondary | ICD-10-CM | POA: Diagnosis not present

## 2016-09-24 DIAGNOSIS — Z85828 Personal history of other malignant neoplasm of skin: Secondary | ICD-10-CM | POA: Insufficient documentation

## 2016-09-24 DIAGNOSIS — K5090 Crohn's disease, unspecified, without complications: Secondary | ICD-10-CM | POA: Diagnosis not present

## 2016-09-24 DIAGNOSIS — E781 Pure hyperglyceridemia: Secondary | ICD-10-CM

## 2016-09-24 DIAGNOSIS — Z807 Family history of other malignant neoplasms of lymphoid, hematopoietic and related tissues: Secondary | ICD-10-CM

## 2016-09-24 DIAGNOSIS — Z8042 Family history of malignant neoplasm of prostate: Secondary | ICD-10-CM | POA: Diagnosis not present

## 2016-09-24 DIAGNOSIS — D45 Polycythemia vera: Secondary | ICD-10-CM | POA: Diagnosis not present

## 2016-09-24 DIAGNOSIS — Z86010 Personal history of colonic polyps: Secondary | ICD-10-CM

## 2016-09-24 DIAGNOSIS — Z148 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: Secondary | ICD-10-CM | POA: Diagnosis not present

## 2016-09-24 DIAGNOSIS — Z79899 Other long term (current) drug therapy: Secondary | ICD-10-CM

## 2016-09-24 DIAGNOSIS — M129 Arthropathy, unspecified: Secondary | ICD-10-CM | POA: Diagnosis not present

## 2016-09-24 DIAGNOSIS — N401 Enlarged prostate with lower urinary tract symptoms: Secondary | ICD-10-CM

## 2016-09-24 DIAGNOSIS — N289 Disorder of kidney and ureter, unspecified: Secondary | ICD-10-CM

## 2016-09-24 LAB — COMPREHENSIVE METABOLIC PANEL
ALT: 18 U/L (ref 17–63)
AST: 36 U/L (ref 15–41)
Albumin: 4.2 g/dL (ref 3.5–5.0)
Alkaline Phosphatase: 80 U/L (ref 38–126)
Anion gap: 6 (ref 5–15)
BUN: 32 mg/dL — ABNORMAL HIGH (ref 6–20)
CO2: 25 mmol/L (ref 22–32)
Calcium: 8.8 mg/dL — ABNORMAL LOW (ref 8.9–10.3)
Chloride: 104 mmol/L (ref 101–111)
Creatinine, Ser: 1.85 mg/dL — ABNORMAL HIGH (ref 0.61–1.24)
GFR calc Af Amer: 41 mL/min — ABNORMAL LOW (ref >60)
GFR calc non Af Amer: 36 mL/min — ABNORMAL LOW (ref >60)
Glucose, Bld: 98 mg/dL (ref 65–99)
Potassium: 4.8 mmol/L (ref 3.5–5.1)
Sodium: 135 mmol/L (ref 135–145)
Total Bilirubin: 0.4 mg/dL (ref 0.3–1.2)
Total Protein: 7.7 g/dL (ref 6.5–8.1)

## 2016-09-24 LAB — CBC WITH DIFFERENTIAL/PLATELET
Basophils Absolute: 0.1 10*3/uL (ref 0–0.1)
Basophils Relative: 1 %
Eosinophils Absolute: 0.1 10*3/uL (ref 0–0.7)
Eosinophils Relative: 1 %
HCT: 32.8 % — ABNORMAL LOW (ref 40.0–52.0)
Hemoglobin: 10.3 g/dL — ABNORMAL LOW (ref 13.0–18.0)
Lymphocytes Relative: 16 %
Lymphs Abs: 2.2 10*3/uL (ref 1.0–3.6)
MCH: 23.4 pg — ABNORMAL LOW (ref 26.0–34.0)
MCHC: 31.5 g/dL — ABNORMAL LOW (ref 32.0–36.0)
MCV: 74.4 fL — ABNORMAL LOW (ref 80.0–100.0)
Monocytes Absolute: 2.5 10*3/uL — ABNORMAL HIGH (ref 0.2–1.0)
Monocytes Relative: 18 %
Neutro Abs: 9 10*3/uL — ABNORMAL HIGH (ref 1.4–6.5)
Neutrophils Relative %: 64 %
Platelets: 380 10*3/uL (ref 150–440)
RBC: 4.41 MIL/uL (ref 4.40–5.90)
RDW: 22.3 % — ABNORMAL HIGH (ref 11.5–14.5)
WBC: 13.9 10*3/uL — ABNORMAL HIGH (ref 3.8–10.6)

## 2016-09-24 LAB — LACTATE DEHYDROGENASE: LDH: 522 U/L — ABNORMAL HIGH (ref 98–192)

## 2016-09-24 NOTE — Progress Notes (Signed)
[facility] day:  09/24/2016  Chief Complaint: Ruben [name] is a 71 y.o. male with polycythemia rubra vera (PV) who is seen for 4 week assessment on Jakafi (ruxolitinib).  HPI: The patient was last seen in the medical oncology clinic on 08/20/2016.  At that time,  he felt good.  Counts included a hematocrit 33.1, platelets 418,000 and WBC 26,800.  LDH was 656.  Uric acid was normal.  Triglycerides were 183 (< 150).  Creatinine was 2.04.  At last visit, we discussed continuation of Jakafi 20 mg BID.  We discussed continuation of ferrous sulfate 325 mg po q Mondays, Wednesday, Fridays.  He was referred to nephrology for his chronic kidney disease.  He states that he met with Dr. Holley Raring yesterday.  He was told to "do what you are doing".  Creatinine was better (1.75).  He continues self catheterization.  Symptomatically, he feels pretty good.  He denies any joint pain.  He is eating well.  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    Past Medical History:  Diagnosis Date  . BPH (benign prostatic hyperplasia)   . Crohn disease ([place])   . Crohn's disease ([place])   . Glaucoma   . Glaucoma   . Gout   . History of chicken pox   . Right bundle branch block     Past Surgical History:  Procedure Laterality Date  . PROSTATE SURGERY  2008   Prostate Biopsy in [place] to elevated PSA.  per patient normal  . Skin Lesion Basal cell removed    . wart removal     from eyelid    Family History  Problem Relation Age of Onset  . Cancer Mother     Liver  . Cancer Father     Prostate  . Cancer Sister     Hodgkins lymphoma  . Cancer Paternal Aunt     Breast    Social History:  reports that he quit smoking about 13 years ago. His smoking use included Cigarettes. He has a 5.00 pack-year smoking history. He does not have any smokeless tobacco history on file. He reports that he drinks about 1.2 oz  of alcohol per week . He reports that he does not use drugs.  He previously worked for [employer].  He volunteers for [employer].  He will be travelling to [facility] from 08/13/2016 - 08/17/2016.  The patient is accompanied by his wife, [name], today.  Allergies: No Known Allergies  Current Medications: Current Outpatient Prescriptions  Medication Sig Dispense Refill  . allopurinol (ZYLOPRIM) 300 MG tablet TAKE 1 TABLET (300 MG TOTAL) BY MOUTH DAILY. 30 tablet 3  . aspirin 81 MG tablet Take 81 mg by mouth daily.    . Cyanocobalamin (RA VITAMIN B-12 TR) 1000 MCG TBCR     . ferrous sulfate 325 (65 FE) MG tablet Take 325 mg by mouth 3 (three) times a week.    . finasteride (PROSCAR) 5 MG tablet Take 1 tablet (5 mg total) by mouth daily. 90 tablet 3  . folic acid (FOLVITE) 1 MG tablet TAKE 1 TABLET (1,000 MCG TOTAL) BY MOUTH ONCE DAILY. MUST KEEP APPT FOR FURTHER REFILLS.    Marland Kitchen latanoprost (XALATAN) 0.005 % ophthalmic solution Apply 1 drop to eye daily.     . Multiple Vitamins-Minerals (CENTRUM SILVER ULTRA MENS) TABS Take by mouth. Reported on 01/30/2016    .  Omega-3 Fatty Acids (KP FISH OIL) 1200 MG CAPS Take by mouth.    . ruxolitinib phosphate (JAKAFI) 20 MG tablet Take 1 tablet (20 mg total) by mouth 2 (two) times daily. 60 tablet 3  . tamsulosin (FLOMAX) 0.4 MG CAPS capsule Take 1 capsule (0.4 mg total) by mouth daily. 30 capsule 11  . timolol (BETIMOL) 0.25 % ophthalmic solution Place 1 drop into both eyes daily.      No current facility-administered medications for this visit.     Review of Systems:  GENERAL:  Feels good.  No fevers or sweats.  Weight up 8 pounds. PERFORMANCE STATUS (ECOG):  1 HEENT:  No visual changes, runny nose, sore throat, mouth sores or tenderness. Lungs: No shortness of breath or cough.  No hemoptysis. Cardiac:  No chest pain, palpitations, orthopnea, or PND. GI:  Diarrhea (chronic).  No nausea, vomiting, constipation, melena or hematochezia. GU:  Self  catheterization 4x/day.  Typical volume 300-400 cc (800 cc in AM).  No urgency, dysuria, or hematuria. Musculoskeletal:  No back pain.  Knee and ankle, improved.  No muscle tenderness. Extremities:  Tendonitis in ankles.  No joint pain or swelling. Skin:  No rashes or skin changes. Neuro:  No headache, numbness or weakness, balance or coordination issues. Endocrine:  No diabetes, thyroid issues, hot flashes or night sweats. Psych:  No mood changes, depression or anxiety. Pain:  Joint pain, resolved. Review of systems:  All other systems reviewed and found to be negative.   Physical Exam: Blood pressure 120/70, pulse 64, temperature 97.2 F (36.2 C), temperature source Tympanic, resp. rate 18, weight 181 lb (82.1 kg). GENERAL:  Well developed, well nourished, gentleman sitting comfortably in the exam room in no acute distress. MENTAL STATUS:  Alert and oriented to person, place and time. HEAD:  Pearline Cables hair.  Normocephalic, atraumatic, face symmetric, no Cushingoid features. EYES: Glasses. Blue eyes. Pupils equal round and reactive to light and accomodation.  No conjunctivitis or scleral icterus. ENT: Oropharynx clear without lesion. Tongue normal. Mucous membranes moist.  RESPIRATORY: Clear to auscultation without rales, wheezes or rhonchi. CARDIOVASCULAR: Regular rate and rhythm without murmur, rub or gallop. ABDOMEN: Soft, non-tender, with active bowel sounds, and no appreciable hepatosplenomegaly.  No masses.  SKIN: No rashes, ulcers or lesions. EXTREMITIES: No edema, no skin discoloration or tenderness. No palpable cords. LYMPH NODES: No palpable cervical, supraclavicular, axillary or inguinal adenopathy  NEUROLOGICAL: Unremarkable. PSYCH: Appropriate.    Appointment on 09/24/2016  Component Date Value Ref Range Status  . Sodium 09/24/2016 135  135 - 145 mmol/L Final  . Potassium 09/24/2016 4.8  3.5 - 5.1 mmol/L Final  . Chloride 09/24/2016 104  101 - 111 mmol/L Final   . CO2 09/24/2016 25  22 - 32 mmol/L Final  . Glucose, Bld 09/24/2016 98  65 - 99 mg/dL Final  . BUN 09/24/2016 32* 6 - 20 mg/dL Final  . Creatinine, Ser 09/24/2016 1.85* 0.61 - 1.24 mg/dL Final  . Calcium 09/24/2016 8.8* 8.9 - 10.3 mg/dL Final  . Total Protein 09/24/2016 7.7  6.5 - 8.1 g/dL Final  . Albumin 09/24/2016 4.2  3.5 - 5.0 g/dL Final  . AST 09/24/2016 36  15 - 41 U/L Final  . ALT 09/24/2016 18  17 - 63 U/L Final  . Alkaline Phosphatase 09/24/2016 80  38 - 126 U/L Final  . Total Bilirubin 09/24/2016 0.4  0.3 - 1.2 mg/dL Final  . GFR calc non Af Amer 09/24/2016 36* >60 mL/min Final  .  GFR calc Af Amer 09/24/2016 41* >60 mL/min Final   Comment: (NOTE) The eGFR has been calculated using the CKD EPI equation. This calculation has not been validated in all clinical situations. eGFR's persistently <60 mL/min signify possible Chronic Kidney Disease.   . Anion gap 09/24/2016 6  5 - 15 Final  . LDH 09/24/2016 522* 98 - 192 U/L Final  . WBC 09/24/2016 13.9* 3.8 - 10.6 K/uL Final  . RBC 09/24/2016 4.41  4.40 - 5.90 MIL/uL Final  . Hemoglobin 09/24/2016 10.3* 13.0 - 18.0 g/dL Final  . HCT 09/24/2016 32.8* 40.0 - 52.0 % Final  . MCV 09/24/2016 74.4* 80.0 - 100.0 fL Final  . MCH 09/24/2016 23.4* 26.0 - 34.0 pg Final  . MCHC 09/24/2016 31.5* 32.0 - 36.0 g/dL Final  . RDW 09/24/2016 22.3* 11.5 - 14.5 % Final  . Platelets 09/24/2016 380  150 - 440 K/uL Final  . Neutrophils Relative % 09/24/2016 64  % Final  . Lymphocytes Relative 09/24/2016 16  % Final  . Monocytes Relative 09/24/2016 18  % Final  . Eosinophils Relative 09/24/2016 1  % Final  . Basophils Relative 09/24/2016 1  % Final  . Neutro Abs 09/24/2016 9.0* 1.4 - 6.5 K/uL Final  . Lymphs Abs 09/24/2016 2.2  1.0 - 3.6 K/uL Final  . Monocytes Absolute 09/24/2016 2.5* 0.2 - 1.0 K/uL Final  . Eosinophils Absolute 09/24/2016 0.1  0 - 0.7 K/uL Final  . Basophils Absolute 09/24/2016 0.1  0 - 0.1 K/uL Final    Assessment:  Ruben [name] is a 71 y.o. 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  He has had polycythemia dating back to 2013.  Hematocrit was 62.1 with a hemoglobin of 19.8 on 03/28/2015.  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  Erythropoietin level was 1.1 (low).  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  He began a phlebotomy program on 03/28/2015 to maintain a hematocrit goal of < 45.  He last underwent phlebotomy on 05/02/2015.  He is on a baby aspirin.  He is followed by GI (Dr. Vira Agar) for a history of polyps and Crohn's disease.  Last colonoscopy was 3-4 years ago.  He has become iron deficient on his phlebotomy program (ferritin 21 on 11/28/2015).   CBC on 11/28/2015 revealed a hematocrit of 39.3, hemoglobin 11.9, platelets 463,000, WBC 32,000 with an ANC of 25,000.  Differential included 71% segs, 4% lymphocytes and 17% monocytes.  Peripheral smear revealed leukocytosis with predominantly mature neutrophils, increased monocytes and rare blasts (<1%).  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  Marrow was packed (95-100% cellularity) with pan myelosis, multi-lineage dyspoiesis, and no significant increase in blasts.  There was moderate to focally marked reticulin fibrosis (grade 2-3/3).  Storage iron was not identified.  Flow cytometry revealed non-specific atypical myeloid findings with no increase in blasts.  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  Cytogenetics were normal (46, XY).  B[facility] marrow on 04/07/2016 at [facility] revealed a hypercellular marrow (> 95%) with persistent involvement by myeloid neoplasm with 5% blasts.  There was mild reticulin fibrosis.  FISH t(9;22) results were normal.  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  Cytogenetics are  pending.  He was briefly on hydroxyurea 500 mg a day (12/05/2015 - 12/19/2015 and 06/25/2016 - 07/03/2016).   Platelet count increased to 1.18 million  and white count to 54,800 without increased blasts on 06/25/2016.  CBC on 07/02/2016 revealed a platelets of 429,000 with a white count 30,600.  He began allopurinol on 12/19/2015.  He began Jakafi 20 mg BID on 05/28/2016.  LDH is followed:  466 on 01/02/2016, 596 on 05/23/2016, 663 on 07/30/2016, and 522 on 09/24/2016.  Triglycerides were 183 (< 150) on 08/20/2016.  He has urinary retention.  Renal ultrasound on 07/28/2016 revealed bilateral hydronephrosis (right > left) and a large postvoid residual (2 liters) suggesting bladder dysfunction or outlet obstruction.  He underwent temporary Foley catheter placement.  He performs I/O self catheterizations.  He is on Flomax and finasteride.  PSA was 2.02 on 07/09/2016.  He has chronic renal insufficiency (Cr 1.85; CrCl 36 ml/min).  He is followed by nephrology and urology.  Symptomatically, he feels good.  Counts include a hematocrit 32.8, platelets 380,000 and WBC is 13,900.  LDH is 656.  Uric acid is normal.  Plan: 1.  Labs today:  CBC with diff, CMP, LDH. 2.  Continue Jakafi 20 mg BID. 3.  Continue ferrous sulfate 325 mg po q Mondays, Wednesday, Fridays. 4.  Discuss mildly elevated triglycerides.  Follow-up with PCP.  Anticipate re-evaluation in 3 months. 5.  RTC monthly x 2 for labs (CBC with diff, BMP). 6.  RTC in 3 months for MD assessment and labs (CBC with diff, CMP, triglycerides, LDH, uric acid).   Lequita Asal, MD  09/24/2016 , 11:39 AM

## 2016-09-24 NOTE — Progress Notes (Signed)
Patient offers no complaints today. 

## 2016-10-01 ENCOUNTER — Ambulatory Visit: Payer: Medicare Other | Admitting: Hematology and Oncology

## 2016-10-01 ENCOUNTER — Other Ambulatory Visit: Payer: Medicare Other

## 2016-10-15 ENCOUNTER — Inpatient Hospital Stay (HOSPITAL_BASED_OUTPATIENT_CLINIC_OR_DEPARTMENT_OTHER): Payer: Medicare Other | Admitting: Hematology and Oncology

## 2016-10-15 ENCOUNTER — Encounter: Payer: Self-pay | Admitting: Hematology and Oncology

## 2016-10-15 VITALS — BP 123/75 | HR 66 | Temp 97.3°F | Resp 18 | Wt 181.9 lb

## 2016-10-15 DIAGNOSIS — D45 Polycythemia vera: Secondary | ICD-10-CM | POA: Diagnosis not present

## 2016-10-15 DIAGNOSIS — K5090 Crohn's disease, unspecified, without complications: Secondary | ICD-10-CM | POA: Diagnosis not present

## 2016-10-15 DIAGNOSIS — Z7982 Long term (current) use of aspirin: Secondary | ICD-10-CM

## 2016-10-15 DIAGNOSIS — Z79899 Other long term (current) drug therapy: Secondary | ICD-10-CM

## 2016-10-15 DIAGNOSIS — N1330 Unspecified hydronephrosis: Secondary | ICD-10-CM | POA: Diagnosis not present

## 2016-10-15 DIAGNOSIS — D7581 Myelofibrosis: Secondary | ICD-10-CM | POA: Diagnosis not present

## 2016-10-15 DIAGNOSIS — N189 Chronic kidney disease, unspecified: Secondary | ICD-10-CM | POA: Diagnosis not present

## 2016-10-15 DIAGNOSIS — I4510 Unspecified right bundle-branch block: Secondary | ICD-10-CM

## 2016-10-15 DIAGNOSIS — Z800 Family history of malignant neoplasm of digestive organs: Secondary | ICD-10-CM

## 2016-10-15 DIAGNOSIS — Z803 Family history of malignant neoplasm of breast: Secondary | ICD-10-CM

## 2016-10-15 DIAGNOSIS — Z807 Family history of other malignant neoplasms of lymphoid, hematopoietic and related tissues: Secondary | ICD-10-CM

## 2016-10-15 DIAGNOSIS — Z8042 Family history of malignant neoplasm of prostate: Secondary | ICD-10-CM

## 2016-10-15 DIAGNOSIS — Z86010 Personal history of colonic polyps: Secondary | ICD-10-CM

## 2016-10-15 DIAGNOSIS — Z8619 Personal history of other infectious and parasitic diseases: Secondary | ICD-10-CM

## 2016-10-15 DIAGNOSIS — M129 Arthropathy, unspecified: Secondary | ICD-10-CM

## 2016-10-15 DIAGNOSIS — Z87891 Personal history of nicotine dependence: Secondary | ICD-10-CM

## 2016-10-15 DIAGNOSIS — N401 Enlarged prostate with lower urinary tract symptoms: Secondary | ICD-10-CM

## 2016-10-15 DIAGNOSIS — Z148 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: Secondary | ICD-10-CM

## 2016-10-15 DIAGNOSIS — Z8639 Personal history of other endocrine, nutritional and metabolic disease: Secondary | ICD-10-CM

## 2016-10-15 DIAGNOSIS — Z85828 Personal history of other malignant neoplasm of skin: Secondary | ICD-10-CM

## 2016-10-15 DIAGNOSIS — R803 Bence Jones proteinuria: Secondary | ICD-10-CM

## 2016-10-15 NOTE — Progress Notes (Signed)
Patient recently saw Dr. Holley Raring and protein was found in urine. K+ at that time was also high.  Patient advised to decrease K+ rich foods.

## 2016-10-15 NOTE — Progress Notes (Signed)
Ruben [name] is a 71 y.o. 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  HPI: The patient was last seen in the medical oncology clinic on 09/24/2016.  At that time, he felt pretty good.  Counts were stable.  Creatinine was 1.85.  Potassium was 4.8.  We discussed follow-up counts monthly.  Jakafi continued.  He was seen by Dr. Holley [name] on 09/23/2016.  His wife notes a recent call about concerns in his blood and urine.  Potassium was 5.8 on 09/23/2016.  SPEP was negative.  Spot urine revealed 17.4% of 27.3 mg/dL.of a monoclonal protein.  He meets with Dr. Holley [name] tomorrow.  Symptomatically, he denies any new complaints.  He denies any new medications.   Past Medical History:  Diagnosis Date  . BPH (benign prostatic hyperplasia)   . Crohn disease ([place])   . Crohn's disease ([place])   . Glaucoma   . Glaucoma   . Gout   . History of chicken pox   . Right bundle branch block     Past Surgical History:  Procedure Laterality Date  . PROSTATE SURGERY  2008   Prostate Biopsy in [place] to elevated PSA.  per patient normal  . Skin Lesion Basal cell removed    . wart removal     from eyelid    Family History  Problem Relation Age of Onset  . Cancer Mother     Liver  . Cancer Father     Prostate  . Cancer Sister     Hodgkins lymphoma  . Cancer Paternal Aunt     Breast    Social History:  reports that he quit smoking about 13 years ago. His smoking use included Cigarettes. He has a 5.00 pack-year smoking history. He has never used smokeless tobacco. He reports that he drinks about 1.2 oz of alcohol per week . He reports that he does not use drugs.  He previously worked for [employer].  He volunteers for [employer].  He will be travelling to [facility] from 08/13/2016 - 08/17/2016.  The patient is accompanied by his  wife, Ruben [name], today.  Allergies: No Known Allergies  Current Medications: Current Outpatient Prescriptions  Medication Sig Dispense Refill  . allopurinol (ZYLOPRIM) 300 MG tablet TAKE 1 TABLET (300 MG TOTAL) BY MOUTH DAILY. 30 tablet 3  . aspirin 81 MG tablet Take 81 mg by mouth daily.    . Cholecalciferol (VITAMIN D3) 1000 units CAPS Take 1 capsule by mouth daily.    . Cyanocobalamin (RA VITAMIN B-12 TR) 1000 MCG TBCR     . ferrous sulfate 325 (65 FE) MG tablet Take 325 mg by mouth 3 (three) times a week.    . finasteride (PROSCAR) 5 MG tablet Take 1 tablet (5 mg total) by mouth daily. 90 tablet 3  . folic acid (FOLVITE) 1 MG tablet TAKE 1 TABLET (1,000 MCG TOTAL) BY MOUTH ONCE DAILY. MUST KEEP APPT FOR FURTHER REFILLS.    Marland Kitchen latanoprost (XALATAN) 0.005 % ophthalmic solution Apply 1 drop to eye daily.     . Multiple Vitamins-Minerals (CENTRUM SILVER ULTRA MENS) TABS Take by mouth. Reported on 01/30/2016    . Omega-3 Fatty Acids (KP FISH OIL) 1200 MG CAPS Take by mouth.    . ruxolitinib phosphate (JAKAFI) 20 MG tablet Take 1 tablet (20 mg total) by mouth 2 (two) times daily.  60 tablet 3  . tamsulosin (FLOMAX) 0.4 MG CAPS capsule Take 1 capsule (0.4 mg total) by mouth daily. 30 capsule 11  . timolol (BETIMOL) 0.25 % ophthalmic solution Place 1 drop into both eyes daily.      No current facility-administered medications for this visit.     Review of Systems:  GENERAL:  Feels "ok".  No fevers or sweats.  Weight stable. PERFORMANCE STATUS (ECOG):  1 HEENT:  No visual changes, runny [name], sore throat, mouth sores or tenderness. Lungs: No shortness of breath or cough.  No hemoptysis. Cardiac:  No chest pain, palpitations, orthopnea, or PND. GI:  Diarrhea (chronic).  No nausea, vomiting, constipation, melena or hematochezia. GU:  Self catheterization 4x/day.  Typical volume 300-400 cc (800 cc in AM).  No urgency, dysuria, or hematuria. Musculoskeletal:  No back pain. No muscle  tenderness. Extremities:  Tendonitis in ankles.  No joint pain or swelling. Skin:  No rashes or skin changes. Neuro:  No headache, numbness or weakness, balance or coordination issues. Endocrine:  No diabetes, thyroid issues, hot flashes or night sweats. Psych:  No mood changes, depression or anxiety. Pain:  No pain. Review of systems:  All other systems reviewed and found to be negative.   Physical Exam: Blood pressure 123/75, pulse 66, temperature 97.3 F (36.3 C), temperature source Tympanic, resp. rate 18, weight 181 lb 14.1 oz (82.5 kg). GENERAL:  Well developed, well nourished, gentleman sitting comfortably in the exam room in no acute distress. MENTAL STATUS:  Alert and oriented to person, place and time. HEAD:  Pearline Cables hair.  Normocephalic, atraumatic, face symmetric, no Cushingoid features. EYES: Glasses. Blue eyes. No conjunctivitis or scleral icterus. NEUROLOGICAL: Unremarkable. PSYCH: Appropriate.    No visits with results within 3 Day(s) from this visit.  Latest known visit with results is:  Appointment on 09/24/2016  Component Date Value Ref Range Status  . Sodium 09/24/2016 135  135 - 145 mmol/L Final  . Potassium 09/24/2016 4.8  3.5 - 5.1 mmol/L Final  . Chloride 09/24/2016 104  101 - 111 mmol/L Final  . CO2 09/24/2016 25  22 - 32 mmol/L Final  . Glucose, Bld 09/24/2016 98  65 - 99 mg/dL Final  . BUN 09/24/2016 32* 6 - 20 mg/dL Final  . Creatinine, Ser 09/24/2016 1.85* 0.61 - 1.24 mg/dL Final  . Calcium 09/24/2016 8.8* 8.9 - 10.3 mg/dL Final  . Total Protein 09/24/2016 7.7  6.5 - 8.1 g/dL Final  . Albumin 09/24/2016 4.2  3.5 - 5.0 g/dL Final  . AST 09/24/2016 36  15 - 41 U/L Final  . ALT 09/24/2016 18  17 - 63 U/L Final  . Alkaline Phosphatase 09/24/2016 80  38 - 126 U/L Final  . Total Bilirubin 09/24/2016 0.4  0.3 - 1.2 mg/dL Final  . GFR calc non Af Amer 09/24/2016 36* >60 mL/min Final  . GFR calc Af Amer 09/24/2016 41* >60 mL/min Final   Comment:  (NOTE) The eGFR has been calculated using the CKD EPI equation. This calculation has not been validated in all clinical situations. eGFR's persistently <60 mL/min signify possible Chronic Kidney Disease.   . Anion gap 09/24/2016 6  5 - 15 Final  . LDH 09/24/2016 522* 98 - 192 U/L Final  . WBC 09/24/2016 13.9* 3.8 - 10.6 K/uL Final  . RBC 09/24/2016 4.41  4.40 - 5.90 MIL/uL Final  . Hemoglobin 09/24/2016 10.3* 13.0 - 18.0 g/dL Final  . HCT 09/24/2016 32.8* 40.0 - 52.0 % Final  .  MCV 09/24/2016 74.4* 80.0 - 100.0 fL Final  . MCH 09/24/2016 23.4* 26.0 - 34.0 pg Final  . MCHC 09/24/2016 31.5* 32.0 - 36.0 g/dL Final  . RDW 09/24/2016 22.3* 11.5 - 14.5 % Final  . Platelets 09/24/2016 380  150 - 440 K/uL Final  . Neutrophils Relative % 09/24/2016 64  % Final  . Lymphocytes Relative 09/24/2016 16  % Final  . Monocytes Relative 09/24/2016 18  % Final  . Eosinophils Relative 09/24/2016 1  % Final  . Basophils Relative 09/24/2016 1  % Final  . Neutro Abs 09/24/2016 9.0* 1.4 - 6.5 K/uL Final  . Lymphs Abs 09/24/2016 2.2  1.0 - 3.6 K/uL Final  . Monocytes Absolute 09/24/2016 2.5* 0.2 - 1.0 K/uL Final  . Eosinophils Absolute 09/24/2016 0.1  0 - 0.7 K/uL Final  . Basophils Absolute 09/24/2016 0.1  0 - 0.1 K/uL Final    Assessment:  Acxel Dingee is a 71 y.o. 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  He has had polycythemia dating back to 2013.  Hematocrit was 62.1 with a hemoglobin of 19.8 on 03/28/2015.  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  Erythropoietin level was 1.1 (low).  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  He began a phlebotomy program on 03/28/2015 to maintain a hematocrit goal of < 45.  He last underwent phlebotomy on 05/02/2015.  He is on a baby aspirin.  He is followed by GI (Dr. Vira Agar) for a history of polyps and Crohn's disease.  Last colonoscopy was 3-4 years ago.  He has become iron deficient on his phlebotomy program (ferritin 21 on 11/28/2015).   CBC  on 11/28/2015 revealed a hematocrit of 39.3, hemoglobin 11.9, platelets 463,000, WBC 32,000 with an ANC of 25,000.  Differential included 71% segs, 4% lymphocytes and 17% monocytes.  Peripheral smear revealed leukocytosis with predominantly mature neutrophils, increased monocytes and rare blasts (<1%).  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  Marrow was packed (95-100% cellularity) with pan myelosis, multi-lineage dyspoiesis, and no significant increase in blasts.  There was moderate to focally marked reticulin fibrosis (grade 2-3/3).  Storage iron was not identified.  Flow cytometry revealed non-specific atypical myeloid findings with no increase in blasts.  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  Cytogenetics were normal (46, XY).  B[facility] marrow on 04/07/2016 at [facility] revealed a hypercellular marrow (> 95%) with persistent involvement by myeloid neoplasm with 5% blasts.  There was mild reticulin fibrosis.  FISH t(9;22) results were normal.  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  Cytogenetics are pending.  He was briefly on hydroxyurea 500 mg a day (12/05/2015 - 12/19/2015 and 06/25/2016 - 07/03/2016).   Platelet count increased to 1.18 million and white count to 54,800 without increased blasts on 06/25/2016.  CBC on 07/02/2016 revealed a platelets of 429,000 with a white count 30,600.  He began allopurinol on 12/19/2015.  He began Jakafi 20 mg BID on 05/28/2016.  LDH is followed:  466 on 01/02/2016, 596 on 05/23/2016, 663 on 07/30/2016, and 522 on 09/24/2016.  Triglycerides were 183 (< 150) on 08/20/2016.  He has urinary retention.  Renal ultrasound on 07/28/2016 revealed bilateral hydronephrosis (right > left) and a large postvoid residual (2 liters)  suggesting bladder dysfunction or outlet obstruction.  He underwent temporary Foley catheter placement.  He performs I/O self catheterizations.  He is on Flomax and finasteride.  PSA was 2.02 on 07/09/2016.  He has chronic renal insufficiency (Cr 1.85; CrCl 36 ml/min).  SPEP on 09/23/2016 was negative.  Spot urine revealed 17.4% of 27.3 mg/dL.of a monoclonal protein.  He is followed by nephrology and urology.  Symptomatically, he feels good.  Counts include a hematocrit 32.8, platelets 380,000 and WBC is 13,900.  LDH is 656.  Uric acid is normal.  Plan: 1.  Discuss labs from Dr. Elwyn Lade office.  Potassium was 5.8 in his office and 4.8 in our office the next day.  Possible hemolyzed specimen.  Creatinine was 1.63 in Dr. Elwyn Lade office (better).  Anticipate lab check in his office tomorrow. 2.  Discuss monoclonal protein in urine.  Discuss plan for 24 hour urine.  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 3.  Collect 24 hour urine for UPEP, immunofixation, and free light chains. 4.  Continue Jakafi 20 mg BID. 5.  Continue ferrous sulfate 325 mg po q Mondays, Wednesday, Fridays. 6.  RTC as previously scheduled.   Lequita Asal, MD  10/15/2016 , 11:53 AM

## 2016-10-16 DIAGNOSIS — N1330 Unspecified hydronephrosis: Secondary | ICD-10-CM | POA: Diagnosis not present

## 2016-10-16 DIAGNOSIS — D7581 Myelofibrosis: Secondary | ICD-10-CM | POA: Diagnosis not present

## 2016-10-16 DIAGNOSIS — E875 Hyperkalemia: Secondary | ICD-10-CM | POA: Diagnosis not present

## 2016-10-16 DIAGNOSIS — N183 Chronic kidney disease, stage 3 (moderate): Secondary | ICD-10-CM | POA: Diagnosis not present

## 2016-10-16 DIAGNOSIS — D45 Polycythemia vera: Secondary | ICD-10-CM | POA: Diagnosis not present

## 2016-10-16 DIAGNOSIS — N2581 Secondary hyperparathyroidism of renal origin: Secondary | ICD-10-CM | POA: Diagnosis not present

## 2016-10-16 DIAGNOSIS — N189 Chronic kidney disease, unspecified: Secondary | ICD-10-CM | POA: Diagnosis not present

## 2016-10-16 DIAGNOSIS — K5090 Crohn's disease, unspecified, without complications: Secondary | ICD-10-CM | POA: Diagnosis not present

## 2016-10-16 DIAGNOSIS — N401 Enlarged prostate with lower urinary tract symptoms: Secondary | ICD-10-CM | POA: Diagnosis not present

## 2016-10-17 ENCOUNTER — Inpatient Hospital Stay: Payer: Medicare Other

## 2016-10-17 DIAGNOSIS — R803 Bence Jones proteinuria: Secondary | ICD-10-CM

## 2016-10-20 LAB — IFE+PROTEIN ELECTRO, 24-HR UR
% BETA, Urine: 31.7 %
ALPHA 1 URINE: 2.5 %
Albumin, U: 41 %
Alpha 2, Urine: 9 %
GAMMA GLOBULIN URINE: 15.8 %
Total Protein, Urine-Ur/day: 974 mg/24 hr — ABNORMAL HIGH (ref 30–150)
Total Protein, Urine: 47.5 mg/dL
Total Volume: 2050

## 2016-10-29 ENCOUNTER — Telehealth: Payer: Self-pay | Admitting: *Deleted

## 2016-10-29 ENCOUNTER — Inpatient Hospital Stay: Payer: Medicare Other | Attending: Hematology and Oncology

## 2016-10-29 DIAGNOSIS — D45 Polycythemia vera: Secondary | ICD-10-CM | POA: Insufficient documentation

## 2016-10-29 DIAGNOSIS — D7581 Myelofibrosis: Secondary | ICD-10-CM

## 2016-10-29 LAB — CBC WITH DIFFERENTIAL/PLATELET
Basophils Absolute: 0.3 10*3/uL — ABNORMAL HIGH (ref 0–0.1)
Basophils Relative: 1 %
Eosinophils Absolute: 0.1 10*3/uL (ref 0–0.7)
Eosinophils Relative: 1 %
HCT: 33.2 % — ABNORMAL LOW (ref 40.0–52.0)
Hemoglobin: 10.4 g/dL — ABNORMAL LOW (ref 13.0–18.0)
Lymphocytes Relative: 11 %
Lymphs Abs: 2.4 10*3/uL (ref 1.0–3.6)
MCH: 22.7 pg — ABNORMAL LOW (ref 26.0–34.0)
MCHC: 31.2 g/dL — ABNORMAL LOW (ref 32.0–36.0)
MCV: 72.8 fL — ABNORMAL LOW (ref 80.0–100.0)
Monocytes Absolute: 4.3 10*3/uL — ABNORMAL HIGH (ref 0.2–1.0)
Monocytes Relative: 19 %
Neutro Abs: 15.8 10*3/uL — ABNORMAL HIGH (ref 1.4–6.5)
Neutrophils Relative %: 68 %
Platelets: 445 10*3/uL — ABNORMAL HIGH (ref 150–440)
RBC: 4.56 MIL/uL (ref 4.40–5.90)
RDW: 21.1 % — ABNORMAL HIGH (ref 11.5–14.5)
WBC: 22.9 10*3/uL — ABNORMAL HIGH (ref 3.8–10.6)

## 2016-10-29 LAB — BASIC METABOLIC PANEL
Anion gap: 5 (ref 5–15)
BUN: 33 mg/dL — ABNORMAL HIGH (ref 6–20)
CO2: 23 mmol/L (ref 22–32)
Calcium: 8.8 mg/dL — ABNORMAL LOW (ref 8.9–10.3)
Chloride: 105 mmol/L (ref 101–111)
Creatinine, Ser: 1.77 mg/dL — ABNORMAL HIGH (ref 0.61–1.24)
GFR calc Af Amer: 43 mL/min — ABNORMAL LOW (ref >60)
GFR calc non Af Amer: 37 mL/min — ABNORMAL LOW (ref >60)
Glucose, Bld: 91 mg/dL (ref 65–99)
Potassium: 4.6 mmol/L (ref 3.5–5.1)
Sodium: 133 mmol/L — ABNORMAL LOW (ref 135–145)

## 2016-10-29 NOTE — Telephone Encounter (Signed)
  My notes indicate that he has a history of polyps and Crohn's disease.  Last colonoscopy was 3-4 years ago.  I think of his PV and treatment as a chronic problem.  He should follow-up with GI recommendations unless there are acute concerns.  M

## 2016-10-29 NOTE — Telephone Encounter (Signed)
Wife states patient has been scheduled for a colonoscopy.  She wants to know in your opinion if he should proceed, given all his other problems?  Please advise.

## 2016-10-29 NOTE — Telephone Encounter (Signed)
Called patient's wife back to let her know that MD recommends he proceed with colonoscopy given his history unless GI has any acute concerns

## 2016-11-03 DIAGNOSIS — N1330 Unspecified hydronephrosis: Secondary | ICD-10-CM | POA: Diagnosis not present

## 2016-11-03 DIAGNOSIS — N183 Chronic kidney disease, stage 3 (moderate): Secondary | ICD-10-CM | POA: Diagnosis not present

## 2016-11-11 ENCOUNTER — Encounter: Payer: Self-pay | Admitting: Hematology and Oncology

## 2016-11-11 DIAGNOSIS — X32XXXA Exposure to sunlight, initial encounter: Secondary | ICD-10-CM | POA: Diagnosis not present

## 2016-11-11 DIAGNOSIS — Z85828 Personal history of other malignant neoplasm of skin: Secondary | ICD-10-CM | POA: Diagnosis not present

## 2016-11-11 DIAGNOSIS — D2271 Melanocytic nevi of right lower limb, including hip: Secondary | ICD-10-CM | POA: Diagnosis not present

## 2016-11-11 DIAGNOSIS — C44712 Basal cell carcinoma of skin of right lower limb, including hip: Secondary | ICD-10-CM | POA: Diagnosis not present

## 2016-11-11 DIAGNOSIS — D2262 Melanocytic nevi of left upper limb, including shoulder: Secondary | ICD-10-CM | POA: Diagnosis not present

## 2016-11-11 DIAGNOSIS — L570 Actinic keratosis: Secondary | ICD-10-CM | POA: Diagnosis not present

## 2016-11-11 DIAGNOSIS — D225 Melanocytic nevi of trunk: Secondary | ICD-10-CM | POA: Diagnosis not present

## 2016-11-11 DIAGNOSIS — D485 Neoplasm of uncertain behavior of skin: Secondary | ICD-10-CM | POA: Diagnosis not present

## 2016-11-13 DIAGNOSIS — H401131 Primary open-angle glaucoma, bilateral, mild stage: Secondary | ICD-10-CM | POA: Diagnosis not present

## 2016-11-18 ENCOUNTER — Telehealth: Payer: Self-pay | Admitting: *Deleted

## 2016-11-18 NOTE — Telephone Encounter (Signed)
[employer] fax received that patient has been approved for his coverage.

## 2016-11-18 NOTE — Telephone Encounter (Signed)
Called wife and informed her that he had been approved for his foundation for medication, voiced understanding, copy of fax mailed to patient.

## 2016-11-26 ENCOUNTER — Inpatient Hospital Stay: Payer: Medicare Other | Attending: Hematology and Oncology

## 2016-11-26 DIAGNOSIS — I4510 Unspecified right bundle-branch block: Secondary | ICD-10-CM | POA: Insufficient documentation

## 2016-11-26 DIAGNOSIS — M109 Gout, unspecified: Secondary | ICD-10-CM | POA: Diagnosis not present

## 2016-11-26 DIAGNOSIS — N2889 Other specified disorders of kidney and ureter: Secondary | ICD-10-CM | POA: Insufficient documentation

## 2016-11-26 DIAGNOSIS — Z79899 Other long term (current) drug therapy: Secondary | ICD-10-CM | POA: Insufficient documentation

## 2016-11-26 DIAGNOSIS — R339 Retention of urine, unspecified: Secondary | ICD-10-CM | POA: Diagnosis not present

## 2016-11-26 DIAGNOSIS — F17210 Nicotine dependence, cigarettes, uncomplicated: Secondary | ICD-10-CM | POA: Insufficient documentation

## 2016-11-26 DIAGNOSIS — D45 Polycythemia vera: Secondary | ICD-10-CM | POA: Diagnosis not present

## 2016-11-26 DIAGNOSIS — D7581 Myelofibrosis: Secondary | ICD-10-CM

## 2016-11-26 DIAGNOSIS — K5090 Crohn's disease, unspecified, without complications: Secondary | ICD-10-CM | POA: Insufficient documentation

## 2016-11-26 DIAGNOSIS — Z148 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: Secondary | ICD-10-CM | POA: Insufficient documentation

## 2016-11-26 DIAGNOSIS — Z86010 Personal history of colonic polyps: Secondary | ICD-10-CM | POA: Diagnosis not present

## 2016-11-26 DIAGNOSIS — Z7982 Long term (current) use of aspirin: Secondary | ICD-10-CM | POA: Diagnosis not present

## 2016-11-26 DIAGNOSIS — N400 Enlarged prostate without lower urinary tract symptoms: Secondary | ICD-10-CM | POA: Diagnosis not present

## 2016-11-26 DIAGNOSIS — N1330 Unspecified hydronephrosis: Secondary | ICD-10-CM | POA: Insufficient documentation

## 2016-11-26 LAB — CBC WITH DIFFERENTIAL/PLATELET
Basophils Absolute: 0.3 10*3/uL — ABNORMAL HIGH (ref 0–0.1)
Basophils Relative: 1 %
Eosinophils Absolute: 0.3 10*3/uL (ref 0–0.7)
Eosinophils Relative: 1 %
HCT: 35.4 % — ABNORMAL LOW (ref 40.0–52.0)
Hemoglobin: 10.9 g/dL — ABNORMAL LOW (ref 13.0–18.0)
Lymphocytes Relative: 9 %
Lymphs Abs: 2.7 10*3/uL (ref 1.0–3.6)
MCH: 22.4 pg — ABNORMAL LOW (ref 26.0–34.0)
MCHC: 30.8 g/dL — ABNORMAL LOW (ref 32.0–36.0)
MCV: 72.6 fL — ABNORMAL LOW (ref 80.0–100.0)
Monocytes Absolute: 5.5 10*3/uL — ABNORMAL HIGH (ref 0.2–1.0)
Monocytes Relative: 18 %
Neutro Abs: 21.6 10*3/uL — ABNORMAL HIGH (ref 1.4–6.5)
Neutrophils Relative %: 71 %
Platelets: 573 10*3/uL — ABNORMAL HIGH (ref 150–440)
RBC: 4.88 MIL/uL (ref 4.40–5.90)
RDW: 20.8 % — ABNORMAL HIGH (ref 11.5–14.5)
WBC: 30.4 10*3/uL — ABNORMAL HIGH (ref 3.8–10.6)

## 2016-11-26 LAB — BASIC METABOLIC PANEL
Anion gap: 8 (ref 5–15)
BUN: 27 mg/dL — ABNORMAL HIGH (ref 6–20)
CO2: 23 mmol/L (ref 22–32)
Calcium: 8.8 mg/dL — ABNORMAL LOW (ref 8.9–10.3)
Chloride: 105 mmol/L (ref 101–111)
Creatinine, Ser: 1.73 mg/dL — ABNORMAL HIGH (ref 0.61–1.24)
GFR calc Af Amer: 45 mL/min — ABNORMAL LOW (ref >60)
GFR calc non Af Amer: 39 mL/min — ABNORMAL LOW (ref >60)
Glucose, Bld: 88 mg/dL (ref 65–99)
Potassium: 4.2 mmol/L (ref 3.5–5.1)
Sodium: 136 mmol/L (ref 135–145)

## 2016-11-27 ENCOUNTER — Encounter: Payer: Self-pay | Admitting: Nephrology

## 2016-11-28 ENCOUNTER — Telehealth: Payer: Self-pay | Admitting: *Deleted

## 2016-11-28 ENCOUNTER — Encounter: Payer: Self-pay | Admitting: Hematology and Oncology

## 2016-11-28 ENCOUNTER — Encounter: Payer: Self-pay | Admitting: *Deleted

## 2016-11-28 NOTE — Telephone Encounter (Signed)
Patient notified of need to cancel colonoscopy for Monday and see Dr. Mike Gip on Wednesday related to increase in blood counts, voiced understanding.

## 2016-12-01 ENCOUNTER — Ambulatory Visit
Admission: RE | Admit: 2016-12-01 | Payer: Medicare Other | Source: Ambulatory Visit | Admitting: Unknown Physician Specialty

## 2016-12-01 ENCOUNTER — Encounter: Admission: RE | Payer: Self-pay | Source: Ambulatory Visit

## 2016-12-01 HISTORY — DX: Unspecified right bundle-branch block: I45.10

## 2016-12-01 SURGERY — COLONOSCOPY WITH PROPOFOL
Anesthesia: General

## 2016-12-03 ENCOUNTER — Inpatient Hospital Stay: Payer: Medicare Other

## 2016-12-03 ENCOUNTER — Encounter: Payer: Self-pay | Admitting: Hematology and Oncology

## 2016-12-03 ENCOUNTER — Other Ambulatory Visit: Payer: Self-pay | Admitting: *Deleted

## 2016-12-03 ENCOUNTER — Telehealth: Payer: Self-pay | Admitting: *Deleted

## 2016-12-03 ENCOUNTER — Inpatient Hospital Stay (HOSPITAL_BASED_OUTPATIENT_CLINIC_OR_DEPARTMENT_OTHER): Payer: Medicare Other | Admitting: Hematology and Oncology

## 2016-12-03 VITALS — BP 111/68 | HR 66 | Temp 97.0°F | Ht 72.0 in | Wt 183.1 lb

## 2016-12-03 DIAGNOSIS — N1330 Unspecified hydronephrosis: Secondary | ICD-10-CM

## 2016-12-03 DIAGNOSIS — M109 Gout, unspecified: Secondary | ICD-10-CM | POA: Diagnosis not present

## 2016-12-03 DIAGNOSIS — D45 Polycythemia vera: Secondary | ICD-10-CM | POA: Diagnosis not present

## 2016-12-03 DIAGNOSIS — N400 Enlarged prostate without lower urinary tract symptoms: Secondary | ICD-10-CM

## 2016-12-03 DIAGNOSIS — Z148 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: Secondary | ICD-10-CM

## 2016-12-03 DIAGNOSIS — F17210 Nicotine dependence, cigarettes, uncomplicated: Secondary | ICD-10-CM

## 2016-12-03 DIAGNOSIS — K5090 Crohn's disease, unspecified, without complications: Secondary | ICD-10-CM | POA: Diagnosis not present

## 2016-12-03 DIAGNOSIS — N2889 Other specified disorders of kidney and ureter: Secondary | ICD-10-CM | POA: Diagnosis not present

## 2016-12-03 DIAGNOSIS — Z7982 Long term (current) use of aspirin: Secondary | ICD-10-CM | POA: Diagnosis not present

## 2016-12-03 DIAGNOSIS — R339 Retention of urine, unspecified: Secondary | ICD-10-CM

## 2016-12-03 DIAGNOSIS — I4510 Unspecified right bundle-branch block: Secondary | ICD-10-CM

## 2016-12-03 DIAGNOSIS — R803 Bence Jones proteinuria: Secondary | ICD-10-CM

## 2016-12-03 DIAGNOSIS — Z86010 Personal history of colonic polyps: Secondary | ICD-10-CM

## 2016-12-03 DIAGNOSIS — Z79899 Other long term (current) drug therapy: Secondary | ICD-10-CM | POA: Diagnosis not present

## 2016-12-03 LAB — BASIC METABOLIC PANEL
Anion gap: 6 (ref 5–15)
BUN: 26 mg/dL — ABNORMAL HIGH (ref 6–20)
CO2: 25 mmol/L (ref 22–32)
Calcium: 9 mg/dL (ref 8.9–10.3)
Chloride: 105 mmol/L (ref 101–111)
Creatinine, Ser: 1.74 mg/dL — ABNORMAL HIGH (ref 0.61–1.24)
GFR calc Af Amer: 44 mL/min — ABNORMAL LOW (ref >60)
GFR calc non Af Amer: 38 mL/min — ABNORMAL LOW (ref >60)
Glucose, Bld: 115 mg/dL — ABNORMAL HIGH (ref 65–99)
Potassium: 4.4 mmol/L (ref 3.5–5.1)
Sodium: 136 mmol/L (ref 135–145)

## 2016-12-03 LAB — CBC WITH DIFFERENTIAL/PLATELET
Band Neutrophils: 0 %
Basophils Absolute: 0.5 10*3/uL — ABNORMAL HIGH (ref 0–0.1)
Basophils Relative: 2 %
Blasts: 2 %
Eosinophils Absolute: 0 10*3/uL (ref 0–0.7)
Eosinophils Relative: 0 %
HCT: 35.9 % — ABNORMAL LOW (ref 40.0–52.0)
Hemoglobin: 11 g/dL — ABNORMAL LOW (ref 13.0–18.0)
Lymphocytes Relative: 8 %
Lymphs Abs: 1.8 10*3/uL (ref 1.0–3.6)
MCH: 22.1 pg — ABNORMAL LOW (ref 26.0–34.0)
MCHC: 30.7 g/dL — ABNORMAL LOW (ref 32.0–36.0)
MCV: 72 fL — ABNORMAL LOW (ref 80.0–100.0)
Metamyelocytes Relative: 1 %
Monocytes Absolute: 2.1 10*3/uL — ABNORMAL HIGH (ref 0.2–1.0)
Monocytes Relative: 9 %
Myelocytes: 1 %
Neutro Abs: 18 10*3/uL — ABNORMAL HIGH (ref 1.4–6.5)
Neutrophils Relative %: 77 %
Other: 0 %
Platelets: 407 10*3/uL (ref 150–440)
Promyelocytes Absolute: 0 %
RBC: 4.99 MIL/uL (ref 4.40–5.90)
RDW: 21.1 % — ABNORMAL HIGH (ref 11.5–14.5)
WBC: 22.8 10*3/uL — ABNORMAL HIGH (ref 3.8–10.6)
nRBC: 0 /100 WBC

## 2016-12-03 NOTE — Progress Notes (Signed)
[facility] day:  12/03/2016  Chief Complaint: Ruben [name] is a 71 y.o. 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  HPI: The patient was last seen in the medical oncology clinic on 10/15/2016.  At that time, he felt well.  CBC revealed a hematocrit 32.8, platelets 380,000 and WBC  13,900.  24 hour urine on 10/16/2016 revealed no monoclonal protein.  He has had monthly counts.  CBC on 10/16/2016 revealed a hematocrit of 33.2, hemoglobin 10.4, platelets 445,000, WBC 22,900.  CBC on 11/26/2016 revealed a hematocrit of 35.4, hemoglobin 10.9, platelets 573,000, and WBC 30,400.  Differential revealed 71% segs, 9% lymphs, 18% monocytes, 1% eosinophils, and 1% basophils.  He was scheduled for colonoscopy on 12/01/2016.  Colonoscopy was cancelled.  Symptomatically, he feels good.  He denies any B symptoms.  He is active.  He is eating well and gaining weight.  He denies any early satiety.   Past Medical History:  Diagnosis Date  . BPH (benign prostatic hyperplasia)   . Crohn disease ([place])   . Crohn's disease ([place])   . Glaucoma   . Glaucoma   . Gout   . History of chicken pox   . RBBB   . Right bundle branch block     Past Surgical History:  Procedure Laterality Date  . PROSTATE SURGERY  2008   Prostate Biopsy in [place] to elevated PSA.  per patient normal  . Skin Lesion Basal cell removed    . wart removal     from eyelid    Family History  Problem Relation Age of Onset  . Cancer Mother     Liver  . Cancer Father     Prostate  . Cancer Sister     Hodgkins lymphoma  . Cancer Paternal Aunt     Breast    Social History:  reports that he quit smoking about 13 years ago. His smoking use included Cigarettes. He has a 5.00 pack-year smoking history. He has never used smokeless tobacco. He reports that he drinks about 1.2 oz of alcohol per week . He reports that  he does not use drugs.  He previously worked for [employer].  He volunteers for [employer].  He will be travelling to [facility] from 08/13/2016 - 08/17/2016.  The patient is accompanied by his wife, [name], today.  Allergies: No Known Allergies  Current Medications: Current Outpatient Prescriptions  Medication Sig Dispense Refill  . allopurinol (ZYLOPRIM) 300 MG tablet TAKE 1 TABLET (300 MG TOTAL) BY MOUTH DAILY. 30 tablet 3  . aspirin 81 MG tablet Take 81 mg by mouth daily.    . Cholecalciferol (VITAMIN D3) 1000 units CAPS Take 1 capsule by mouth daily.    . Cyanocobalamin (RA VITAMIN B-12 TR) 1000 MCG TBCR     . ferrous sulfate 325 (65 FE) MG tablet Take 325 mg by mouth 3 (three) times a week.    . finasteride (PROSCAR) 5 MG tablet Take 1 tablet (5 mg total) by mouth daily. 90 tablet 3  . folic acid (FOLVITE) 1 MG tablet TAKE 1 TABLET (1,000 MCG TOTAL) BY MOUTH ONCE DAILY. MUST KEEP APPT FOR FURTHER REFILLS.    Marland Kitchen latanoprost (XALATAN) 0.005 % ophthalmic solution Apply 1 drop to eye daily.     . Multiple Vitamins-Minerals (CENTRUM SILVER ULTRA MENS) TABS Take by mouth. Reported on 01/30/2016    . Omega-3 Fatty  Acids (KP FISH OIL) 1200 MG CAPS Take by mouth.    . ruxolitinib phosphate (JAKAFI) 20 MG tablet Take 1 tablet (20 mg total) by mouth 2 (two) times daily. 60 tablet 3  . tamsulosin (FLOMAX) 0.4 MG CAPS capsule Take 1 capsule (0.4 mg total) by mouth daily. 30 capsule 11  . timolol (BETIMOL) 0.25 % ophthalmic solution Place 1 drop into both eyes daily.      No current facility-administered medications for this visit.     Review of Systems:  GENERAL:  Feels "lovely".  No fevers or sweats.  Weight up 2 pounds. PERFORMANCE STATUS (ECOG):  1 HEENT:  No visual changes, runny nose, sore throat, mouth sores or tenderness. Lungs: No shortness of breath or cough.  No hemoptysis. Cardiac:  No chest pain, palpitations, orthopnea, or PND. GI:  Diarrhea (chronic).  No nausea, vomiting,  constipation, melena or hematochezia. GU:  Self catheterization 4x/day.  Typical volume 300-400 cc (800 cc in AM).  No urgency, dysuria, or hematuria. Musculoskeletal:  No back pain. No muscle tenderness. Extremities:  No bone pain.  No joint pain or swelling. Skin:  No rashes or skin changes. Neuro:  No headache, numbness or weakness, balance or coordination issues. Endocrine:  No diabetes, thyroid issues, hot flashes or night sweats. Psych:  No mood changes, depression or anxiety. Pain:  No pain. Review of systems:  All other systems reviewed and found to be negative.   Physical Exam: Blood pressure 111/68, pulse 66, temperature 97 F (36.1 C), temperature source Tympanic, height 6' (1.829 m), weight 183 lb 1.5 oz (83 kg). GENERAL:  Well developed, well nourished, gentleman sitting comfortably in the exam room in no acute distress. MENTAL STATUS:  Alert and oriented to person, place and time. HEAD:  Pearline Cables hair.  Normocephalic, atraumatic, face symmetric, no Cushingoid features. EYES: Glasses. Blue eyes. Pupils equal round and reactive to light and accomodation.  No conjunctivitis or scleral icterus. ENT: Oropharynx clear without lesion. Tongue normal. Mucous membranes moist.  RESPIRATORY: Clear to auscultation without rales, wheezes or rhonchi. CARDIOVASCULAR: Regular rate and rhythm without murmur, rub or gallop. ABDOMEN: Soft, non-tender, with active bowel sounds, and no appreciable hepatosplenomegaly.  No masses.  SKIN: No rashes, ulcers or lesions. EXTREMITIES: No edema, no skin discoloration or tenderness. No palpable cords. LYMPH NODES: No palpable cervical, supraclavicular, axillary or inguinal adenopathy  NEUROLOGICAL: Unremarkable. PSYCH: Appropriate.    Office Visit on 12/03/2016  Component Date Value Ref Range Status  . WBC 12/03/2016 22.8* 3.8 - 10.6 K/uL Final  . RBC 12/03/2016 4.99  4.40 - 5.90 MIL/uL Final  . Hemoglobin 12/03/2016 11.0* 13.0 - 18.0 g/dL  Final  . HCT 12/03/2016 35.9* 40.0 - 52.0 % Final  . MCV 12/03/2016 72.0* 80.0 - 100.0 fL Final  . MCH 12/03/2016 22.1* 26.0 - 34.0 pg Final  . MCHC 12/03/2016 30.7* 32.0 - 36.0 g/dL Final  . RDW 12/03/2016 21.1* 11.5 - 14.5 % Final  . Platelets 12/03/2016 407  150 - 440 K/uL Final  . Neutrophils Relative % 12/03/2016 PENDING  % Incomplete  . Neutro Abs 12/03/2016 PENDING  1.7 - 7.7 K/uL Incomplete  . Band Neutrophils 12/03/2016 PENDING  % Incomplete  . Lymphocytes Relative 12/03/2016 PENDING  % Incomplete  . Lymphs Abs 12/03/2016 PENDING  0.7 - 4.0 K/uL Incomplete  . Monocytes Relative 12/03/2016 PENDING  % Incomplete  . Monocytes Absolute 12/03/2016 PENDING  0.1 - 1.0 K/uL Incomplete  . Eosinophils Relative 12/03/2016 PENDING  % Incomplete  .  Eosinophils Absolute 12/03/2016 PENDING  0.0 - 0.7 K/uL Incomplete  . Basophils Relative 12/03/2016 PENDING  % Incomplete  . Basophils Absolute 12/03/2016 PENDING  0.0 - 0.1 K/uL Incomplete  . WBC Morphology 12/03/2016 PENDING   Incomplete  . RBC Morphology 12/03/2016 PENDING   Incomplete  . Smear Review 12/03/2016 PENDING   Incomplete  . Other 12/03/2016 PENDING  % Incomplete  . nRBC 12/03/2016 PENDING  0 /100 WBC Incomplete  . Metamyelocytes Relative 12/03/2016 PENDING  % Incomplete  . Myelocytes 12/03/2016 PENDING  % Incomplete  . Promyelocytes Absolute 12/03/2016 PENDING  % Incomplete  . Blasts 12/03/2016 PENDING  % Incomplete  . Sodium 12/03/2016 136  135 - 145 mmol/L Final  . Potassium 12/03/2016 4.4  3.5 - 5.1 mmol/L Final  . Chloride 12/03/2016 105  101 - 111 mmol/L Final  . CO2 12/03/2016 25  22 - 32 mmol/L Final  . Glucose, Bld 12/03/2016 115* 65 - 99 mg/dL Final  . BUN 12/03/2016 26* 6 - 20 mg/dL Final  . Creatinine, Ser 12/03/2016 1.74* 0.61 - 1.24 mg/dL Final  . Calcium 12/03/2016 9.0  8.9 - 10.3 mg/dL Final  . GFR calc non Af Amer 12/03/2016 38* >60 mL/min Final  . GFR calc Af Amer 12/03/2016 44* >60 mL/min Final   Comment:  (NOTE) The eGFR has been calculated using the CKD EPI equation. This calculation has not been validated in all clinical situations. eGFR's persistently <60 mL/min signify possible Chronic Kidney Disease.   Georgiann Hahn gap 12/03/2016 6  5 - 15 Final    Assessment:  Daily Crate is a 71 y.o. 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  He has had polycythemia dating back to 2013.  Hematocrit was 62.1 with a hemoglobin of 19.8 on 03/28/2015.  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  Erythropoietin level was 1.1 (low).  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  He began a phlebotomy program on 03/28/2015 to maintain a hematocrit goal of < 45.  He last underwent phlebotomy on 05/02/2015.  He is on a baby aspirin.  He is followed by GI (Dr. Vira Agar) for a history of polyps and Crohn's disease.  Last colonoscopy was 3-4 years ago.  He has become iron deficient on his phlebotomy program (ferritin 21 on 11/28/2015).   CBC on 11/28/2015 revealed a hematocrit of 39.3, hemoglobin 11.9, platelets 463,000, WBC 32,000 with an ANC of 25,000.  Differential included 71% segs, 4% lymphocytes and 17% monocytes.  Peripheral smear revealed leukocytosis with predominantly mature neutrophils, increased monocytes and rare blasts (<1%).  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  Marrow was packed (95-100% cellularity) with pan myelosis, multi-lineage dyspoiesis, and no significant increase in blasts.  There was moderate to focally marked reticulin fibrosis (grade 2-3/3).  Storage iron was not identified.  Flow cytometry revealed non-specific atypical myeloid findings with no increase in blasts.  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  Cytogenetics were normal (46, XY).  Bone marrow on 04/07/2016 at [facility] revealed a  hypercellular marrow (> 95%) with persistent involvement by myeloid neoplasm with 5% blasts.  There was mild reticulin fibrosis.  FISH t(9;22) results were normal.  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  Cytogenetics are pending.  He was briefly on hydroxyurea 500 mg a day (12/05/2015 - 12/19/2015 and 06/25/2016 - 07/03/2016).   Platelet  count increased to 1.18 million and white count to 54,800 without increased blasts on 06/25/2016.  CBC on 07/02/2016 revealed a platelets of 429,000 with a white count 30,600.  He began allopurinol on 12/19/2015.  He began Jakafi 20 mg BID on 05/28/2016.  LDH is followed:  466 on 01/02/2016, 596 on 05/23/2016, 663 on 07/30/2016, and 522 on 09/24/2016.  Triglycerides were 183 (< 150) on 08/20/2016.  He has urinary retention.  Renal ultrasound on 07/28/2016 revealed bilateral hydronephrosis (right > left) and a large postvoid residual (2 liters) suggesting bladder dysfunction or outlet obstruction.  He underwent temporary Foley catheter placement.  He performs I/O self catheterizations.  He is on Flomax and finasteride.  PSA was 2.02 on 07/09/2016.  He has chronic renal insufficiency (Cr 1.85; CrCl 36 ml/min).  SPEP on 09/23/2016 was negative.  Spot urine revealed 17.4% of 27.3 mg/dL.of a monoclonal protein.  24 hour urine on 10/16/2016 revealed no monoclonal protein.  He is followed by nephrology and urology.  Symptomatically, he feels good.  WBC and platelet count have been increasing over the past 2 months.  Colonoscopy was postponed.  Exam is stable.  Plan: 1.  Labs today:  CBC with diff, BMP, SPEP. 2.  Discuss labs from last week.  Platelets was 573,000.  WBC was 30,400.  Discuss goal platelet count of < 400,000.  Discuss plans for repeat labs today and adding hydroxyurea if counts are still elevated.  Consider increasing dose of Jakafi. 3.  Discuss plans for colonoscopy after counts stabilize. 4.   Continue Jakafi 20 mg BID. 5.  Continue ferrous sulfate 325 mg po q Mondays, Wednesday, Fridays. 6.  RTC in 1 week for labs (CBC with diff) if hydroxyurea added given rapid response to hydroxyurea in the past. 7.  RTC on 12/24/2016 for MD assessment and labs (previously scheduled).  Addendum: CBC today reveals return to patient's normal counts.  Will hold off on starting hydroxyurea.  Patient called and message left.   Lequita Asal, MD  12/03/2016 , 1:05 PM

## 2016-12-03 NOTE — Progress Notes (Signed)
Patient here for follow up no changes since last appointment

## 2016-12-03 NOTE — Telephone Encounter (Signed)
-----   Message from Lequita Asal, MD sent at 12/03/2016  3:31 PM EDT ----- Regarding: Please call patient  Labs look good today.  No need to start hydroxyurea. No need for lab recheck next week.  M

## 2016-12-03 NOTE — Telephone Encounter (Signed)
Called patient to inform him that his labs looked good today.  No need to start hydroxyurea,  Also will not have to return next week for labs as previously scheduled.

## 2016-12-05 LAB — MULTIPLE MYELOMA PANEL, SERUM
Albumin SerPl Elph-Mcnc: 4.1 g/dL (ref 2.9–4.4)
Albumin/Glob SerPl: 1.3 (ref 0.7–1.7)
Alpha 1: 0.2 g/dL (ref 0.0–0.4)
Alpha2 Glob SerPl Elph-Mcnc: 0.7 g/dL (ref 0.4–1.0)
B-Globulin SerPl Elph-Mcnc: 0.9 g/dL (ref 0.7–1.3)
Gamma Glob SerPl Elph-Mcnc: 1.5 g/dL (ref 0.4–1.8)
Globulin, Total: 3.3 g/dL (ref 2.2–3.9)
IgA: 409 mg/dL (ref 61–437)
IgG (Immunoglobin G), Serum: 1426 mg/dL (ref 700–1600)
IgM, Serum: 80 mg/dL (ref 20–172)
Total Protein ELP: 7.4 g/dL (ref 6.0–8.5)

## 2016-12-08 ENCOUNTER — Other Ambulatory Visit: Payer: Self-pay | Admitting: Hematology and Oncology

## 2016-12-10 ENCOUNTER — Other Ambulatory Visit: Payer: Medicare Other

## 2016-12-19 ENCOUNTER — Telehealth: Payer: Self-pay | Admitting: Family Medicine

## 2016-12-19 NOTE — Telephone Encounter (Signed)
Called Pt to schedule AWV with [facility] - knb

## 2016-12-24 ENCOUNTER — Ambulatory Visit: Payer: Medicare Other | Admitting: Hematology and Oncology

## 2016-12-24 ENCOUNTER — Other Ambulatory Visit: Payer: Medicare Other

## 2016-12-24 DIAGNOSIS — L905 Scar conditions and fibrosis of skin: Secondary | ICD-10-CM | POA: Diagnosis not present

## 2016-12-24 DIAGNOSIS — C44712 Basal cell carcinoma of skin of right lower limb, including hip: Secondary | ICD-10-CM | POA: Diagnosis not present

## 2016-12-29 DIAGNOSIS — D469 Myelodysplastic syndrome, unspecified: Secondary | ICD-10-CM | POA: Diagnosis not present

## 2016-12-29 DIAGNOSIS — N183 Chronic kidney disease, stage 3 (moderate): Secondary | ICD-10-CM | POA: Diagnosis not present

## 2016-12-29 DIAGNOSIS — D45 Polycythemia vera: Secondary | ICD-10-CM | POA: Diagnosis not present

## 2016-12-31 ENCOUNTER — Encounter: Payer: Self-pay | Admitting: Hematology and Oncology

## 2016-12-31 ENCOUNTER — Inpatient Hospital Stay: Payer: Medicare Other

## 2016-12-31 ENCOUNTER — Ambulatory Visit: Payer: Medicare Other | Admitting: Hematology and Oncology

## 2016-12-31 ENCOUNTER — Inpatient Hospital Stay: Payer: Medicare Other | Attending: Hematology and Oncology | Admitting: Hematology and Oncology

## 2016-12-31 VITALS — BP 132/81 | HR 60 | Resp 18 | Wt 184.1 lb

## 2016-12-31 DIAGNOSIS — D45 Polycythemia vera: Secondary | ICD-10-CM | POA: Insufficient documentation

## 2016-12-31 DIAGNOSIS — Z79899 Other long term (current) drug therapy: Secondary | ICD-10-CM | POA: Insufficient documentation

## 2016-12-31 DIAGNOSIS — Z8719 Personal history of other diseases of the digestive system: Secondary | ICD-10-CM | POA: Insufficient documentation

## 2016-12-31 DIAGNOSIS — Z8041 Family history of malignant neoplasm of ovary: Secondary | ICD-10-CM | POA: Diagnosis not present

## 2016-12-31 DIAGNOSIS — Z7982 Long term (current) use of aspirin: Secondary | ICD-10-CM | POA: Diagnosis not present

## 2016-12-31 DIAGNOSIS — Z803 Family history of malignant neoplasm of breast: Secondary | ICD-10-CM | POA: Diagnosis not present

## 2016-12-31 DIAGNOSIS — F17210 Nicotine dependence, cigarettes, uncomplicated: Secondary | ICD-10-CM | POA: Diagnosis not present

## 2016-12-31 DIAGNOSIS — Z800 Family history of malignant neoplasm of digestive organs: Secondary | ICD-10-CM | POA: Diagnosis not present

## 2016-12-31 DIAGNOSIS — Z86010 Personal history of colonic polyps: Secondary | ICD-10-CM | POA: Insufficient documentation

## 2016-12-31 DIAGNOSIS — I4510 Unspecified right bundle-branch block: Secondary | ICD-10-CM

## 2016-12-31 DIAGNOSIS — Z148 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: Secondary | ICD-10-CM | POA: Diagnosis not present

## 2016-12-31 DIAGNOSIS — K5090 Crohn's disease, unspecified, without complications: Secondary | ICD-10-CM | POA: Insufficient documentation

## 2016-12-31 DIAGNOSIS — N400 Enlarged prostate without lower urinary tract symptoms: Secondary | ICD-10-CM

## 2016-12-31 DIAGNOSIS — N189 Chronic kidney disease, unspecified: Secondary | ICD-10-CM

## 2016-12-31 DIAGNOSIS — M109 Gout, unspecified: Secondary | ICD-10-CM | POA: Insufficient documentation

## 2016-12-31 DIAGNOSIS — N1330 Unspecified hydronephrosis: Secondary | ICD-10-CM | POA: Insufficient documentation

## 2016-12-31 DIAGNOSIS — D7581 Myelofibrosis: Secondary | ICD-10-CM | POA: Insufficient documentation

## 2016-12-31 NOTE — Progress Notes (Signed)
Patient offers no concerns today. Patient had labs at [facility] on Monday.  Patient brought a copy today.

## 2016-12-31 NOTE — Progress Notes (Signed)
[facility] day:  12/31/2016  Chief Complaint: Ruben [name] is a 71 y.o. 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  HPI: The patient was last seen in the medical oncology clinic on 12/03/2016.  At that time, he felt good.  Counts had returned back to normal.  He continue Jakafi.  Hydroxyurea was not added.  He had follow-up counts without intervention.  CBC on 10/16/2016 revealed a hematocrit of 33.2, hemoglobin 10.4, platelets 445,000, WBC 22,900.  CBC on 11/26/2016 revealed a hematocrit of 35.4, hemoglobin 10.9, platelets 573,000, and WBC 30,400.    During the interim, he has done well.   He was seen by Dr. Adriana Simas at [facility] on 12/29/2016.  Labs at that time included a hematocrit of 37.1, hemoglobin 11.3, MCV 77.8, platelets 493,000, white count 20,700 with an [place] of 15,700.  Differential revealed 71% segs, 9% lymphs, 18% monocytes, 1% eosinophils, and 1% basophils.  Potassium was 5.7. Creatinine was 1.84. Liver function tests were normal.  Plan was to increase his Jakafi to 25 mg twice a day.  He was to continue ferrous sulfate 325 mg 3 times weekly.  Recommendation was for annual dermatology exam for non-melanoma skin cancers.  Symptomatically, he denies any concerns. He is watching food intake rich in potassium.   Past Medical History:  Diagnosis Date  . BPH (benign prostatic hyperplasia)   . Crohn disease ([place])   . Crohn's disease ([place])   . Glaucoma   . Glaucoma   . Gout   . History of chicken pox   . RBBB   . Right bundle branch block     Past Surgical History:  Procedure Laterality Date  . PROSTATE SURGERY  2008   Prostate Biopsy in [place] to elevated PSA.  per patient normal  . Skin Lesion Basal cell removed    . wart removal     from eyelid    Family History  Problem Relation Age of Onset  . Cancer Mother     Liver  . Cancer Father     Prostate  . Cancer Sister      Hodgkins lymphoma  . Cancer Paternal Aunt     Breast    Social History:  reports that he quit smoking about 13 years ago. His smoking use included Cigarettes. He has a 5.00 pack-year smoking history. He has never used smokeless tobacco. He reports that he drinks about 1.2 oz of alcohol per week . He reports that he does not use drugs.  He previously worked for [employer].  He volunteers for [employer].  He will be travelling to [facility] from 08/13/2016 - 08/17/2016.  The patient is accompanied by his wife, [name], today.  Allergies: No Known Allergies  Current Medications: Current Outpatient Prescriptions  Medication Sig Dispense Refill  . allopurinol (ZYLOPRIM) 300 MG tablet TAKE 1 TABLET (300 MG TOTAL) BY MOUTH DAILY. 30 tablet 3  . aspirin 81 MG tablet Take 81 mg by mouth daily.    . Cholecalciferol (VITAMIN D3) 1000 units CAPS Take 1 capsule by mouth daily.    . Cyanocobalamin (RA VITAMIN B-12 TR) 1000 MCG TBCR     . ferrous sulfate 325 (65 FE) MG tablet Take 325 mg by mouth 3 (three) times a week.    . finasteride (PROSCAR) 5 MG tablet Take 1 tablet (5 mg total) by mouth daily. 90 tablet 3  . folic acid (FOLVITE)  1 MG tablet TAKE 1 TABLET (1,000 MCG TOTAL) BY MOUTH ONCE DAILY. MUST KEEP APPT FOR FURTHER REFILLS.    Marland Kitchen latanoprost (XALATAN) 0.005 % ophthalmic solution Apply 1 drop to eye daily.     . Multiple Vitamins-Minerals (CENTRUM SILVER ULTRA MENS) TABS Take by mouth. Reported on 01/30/2016    . Omega-3 Fatty Acids (KP FISH OIL) 1200 MG CAPS Take by mouth.    . ruxolitinib phosphate (JAKAFI) 20 MG tablet Take 1 tablet (20 mg total) by mouth 2 (two) times daily. 60 tablet 3  . tamsulosin (FLOMAX) 0.4 MG CAPS capsule Take 1 capsule (0.4 mg total) by mouth daily. 30 capsule 11  . timolol (BETIMOL) 0.25 % ophthalmic solution Place 1 drop into both eyes daily.      No current facility-administered medications for this visit.     Review of Systems:  GENERAL:  Feels "lovely".   No fevers or sweats.  Weight up 2 pounds. PERFORMANCE STATUS (ECOG):  1 HEENT:  No visual changes, runny nose, sore throat, mouth sores or tenderness. Lungs: No shortness of breath or cough.  No hemoptysis. Cardiac:  No chest pain, palpitations, orthopnea, or PND. GI:  Diarrhea (chronic).  No nausea, vomiting, constipation, melena or hematochezia. GU:  Self catheterization 4x/day.  Typical volume 300-400 cc (800 cc in AM).  No urgency, dysuria, or hematuria. Musculoskeletal:  No back pain. No muscle tenderness. Extremities:  No bone pain.  No joint pain or swelling. Skin:  No rashes or skin changes. Neuro:  No headache, numbness or weakness, balance or coordination issues. Endocrine:  No diabetes, thyroid issues, hot flashes or night sweats. Psych:  No mood changes, depression or anxiety. Pain:  No pain. Review of systems:  All other systems reviewed and found to be negative.   Physical Exam: Blood pressure 132/81, pulse 60, resp. rate 18, weight 184 lb 1.4 oz (83.5 kg). GENERAL:  Well developed, well nourished, gentleman sitting comfortably in the exam room in no acute distress. MENTAL STATUS:  Alert and oriented to person, place and time. HEAD:  Pearline Cables hair.  Normocephalic, atraumatic, face symmetric, no Cushingoid features. EYES: Glasses. Blue eyes. Pupils equal round and reactive to light and accomodation.  No conjunctivitis or scleral icterus. ENT: Oropharynx clear without lesion. Tongue normal. Mucous membranes moist.  RESPIRATORY: Clear to auscultation without rales, wheezes or rhonchi. CARDIOVASCULAR: Regular rate and rhythm without murmur, rub or gallop. ABDOMEN: Soft, non-tender, with active bowel sounds, and no appreciable hepatosplenomegaly.  No masses.  SKIN: No rashes, ulcers or lesions. EXTREMITIES: No edema, no skin discoloration or tenderness. No palpable cords. LYMPH NODES: No palpable cervical, supraclavicular, axillary or inguinal adenopathy  NEUROLOGICAL:  Unremarkable. PSYCH: Appropriate.    No visits with results within 3 Day(s) from this visit.  Latest known visit with results is:  Office Visit on 12/03/2016  Component Date Value Ref Range Status  . WBC 12/03/2016 22.8* 3.8 - 10.6 K/uL Final  . RBC 12/03/2016 4.99  4.40 - 5.90 MIL/uL Final  . Hemoglobin 12/03/2016 11.0* 13.0 - 18.0 g/dL Final  . HCT 12/03/2016 35.9* 40.0 - 52.0 % Final  . MCV 12/03/2016 72.0* 80.0 - 100.0 fL Final  . MCH 12/03/2016 22.1* 26.0 - 34.0 pg Final  . MCHC 12/03/2016 30.7* 32.0 - 36.0 g/dL Final  . RDW 12/03/2016 21.1* 11.5 - 14.5 % Final  . Platelets 12/03/2016 407  150 - 440 K/uL Final  . Neutrophils Relative % 12/03/2016 77  % Final  . Lymphocytes Relative  12/03/2016 8  % Final  . Monocytes Relative 12/03/2016 9  % Final  . Eosinophils Relative 12/03/2016 0  % Final  . Basophils Relative 12/03/2016 2  % Final  . Band Neutrophils 12/03/2016 0  % Final  . Metamyelocytes Relative 12/03/2016 1  % Final  . Myelocytes 12/03/2016 1  % Final  . Promyelocytes Absolute 12/03/2016 0  % Final  . Blasts 12/03/2016 2  % Final  . nRBC 12/03/2016 0  0 /100 WBC Final  . Other 12/03/2016 0  % Final  . Neutro Abs 12/03/2016 18.0* 1.4 - 6.5 K/uL Final  . Lymphs Abs 12/03/2016 1.8  1.0 - 3.6 K/uL Final  . Monocytes Absolute 12/03/2016 2.1* 0.2 - 1.0 K/uL Final  . Eosinophils Absolute 12/03/2016 0.0  0 - 0.7 K/uL Final  . Basophils Absolute 12/03/2016 0.5* 0 - 0.1 K/uL Final  . IgG (Immunoglobin G), Serum 12/03/2016 1426  700 - 1,600 mg/dL Final  . IgA 12/03/2016 409  61 - 437 mg/dL Final  . IgM, Serum 12/03/2016 80  20 - 172 mg/dL Final  . Total Protein ELP 12/03/2016 7.4  6.0 - 8.5 g/dL Corrected  . Albumin SerPl Elph-Mcnc 12/03/2016 4.1  2.9 - 4.4 g/dL Corrected  . Alpha 1 12/03/2016 0.2  0.0 - 0.4 g/dL Corrected  . Alpha2 Glob SerPl Elph-Mcnc 12/03/2016 0.7  0.4 - 1.0 g/dL Corrected  . B-Globulin SerPl Elph-Mcnc 12/03/2016 0.9  0.7 - 1.3 g/dL Corrected  . Gamma  Glob SerPl Elph-Mcnc 12/03/2016 1.5  0.4 - 1.8 g/dL Corrected  . M Protein SerPl Elph-Mcnc 12/03/2016 Not Observed  Not Observed g/dL Corrected  . Globulin, Total 12/03/2016 3.3  2.2 - 3.9 g/dL Corrected  . Albumin/Glob SerPl 12/03/2016 1.3  0.7 - 1.7 Corrected  . IFE 1 12/03/2016 Comment   Corrected   Comment: (NOTE) An apparent polyclonal gammopathy: IgA. Kappa and lambda typing appear increased.   . Please Note 12/03/2016 Comment   Corrected   Comment: (NOTE) Protein electrophoresis scan will follow via computer, mail, or courier delivery. Performed At: [facility] [place], [place] [id number] Lindon Romp MD [id number]   . Sodium 12/03/2016 136  135 - 145 mmol/L Final  . Potassium 12/03/2016 4.4  3.5 - 5.1 mmol/L Final  . Chloride 12/03/2016 105  101 - 111 mmol/L Final  . CO2 12/03/2016 25  22 - 32 mmol/L Final  . Glucose, Bld 12/03/2016 115* 65 - 99 mg/dL Final  . BUN 12/03/2016 26* 6 - 20 mg/dL Final  . Creatinine, Ser 12/03/2016 1.74* 0.61 - 1.24 mg/dL Final  . Calcium 12/03/2016 9.0  8.9 - 10.3 mg/dL Final  . GFR calc non Af Amer 12/03/2016 38* >60 mL/min Final  . GFR calc Af Amer 12/03/2016 44* >60 mL/min Final   Comment: (NOTE) The eGFR has been calculated using the CKD EPI equation. This calculation has not been validated in all clinical situations. eGFR's persistently <60 mL/min signify possible Chronic Kidney Disease.   Georgiann Hahn gap 12/03/2016 6  5 - 15 Final    Assessment:  Ruben [name] is a 71 y.o. 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  He has had polycythemia dating back to 2013.  Hematocrit was 62.1 with a hemoglobin of 19.8 on 03/28/2015.  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  Erythropoietin level was 1.1 (low).  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  He began a phlebotomy program on 03/28/2015 to maintain a hematocrit goal of <  74.  He last underwent phlebotomy on 05/02/2015.  He is on a baby  aspirin.  He is followed by GI (Dr. Vira Agar) for a history of polyps and Crohn's disease.  Last colonoscopy was 3-4 years ago.  He has become iron deficient on his phlebotomy program (ferritin 21 on 11/28/2015).   CBC on 11/28/2015 revealed a hematocrit of 39.3, hemoglobin 11.9, platelets 463,000, WBC 32,000 with an ANC of 25,000.  Differential included 71% segs, 4% lymphocytes and 17% monocytes.  Peripheral smear revealed leukocytosis with predominantly mature neutrophils, increased monocytes and rare blasts (<1%).  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  Marrow was packed (95-100% cellularity) with pan myelosis, multi-lineage dyspoiesis, and no significant increase in blasts.  There was moderate to focally marked reticulin fibrosis (grade 2-3/3).  Storage iron was not identified.  Flow cytometry revealed non-specific atypical myeloid findings with no increase in blasts.  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  Cytogenetics were normal (46, XY).  Bone marrow on 04/07/2016 at [facility] revealed a hypercellular marrow (> 95%) with persistent involvement by myeloid neoplasm with 5% blasts.  There was mild reticulin fibrosis.  FISH t(9;22) results were normal.  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  Cytogenetics are pending.  He was briefly on hydroxyurea 500 mg a day (12/05/2015 - 12/19/2015 and 06/25/2016 - 07/03/2016).   Platelet count increased to 1.18 million and white count to 54,800 without increased blasts on 06/25/2016.  CBC on 07/02/2016 revealed a platelets of 429,000 with a white count 30,600.  He began allopurinol on 12/19/2015.  He began Jakafi 20 mg BID on 05/28/2016.  LDH is followed:  466 on 01/02/2016, 596 on 05/23/2016, 663 on 07/30/2016, and  522 on 09/24/2016.  Triglycerides were 183 (< 150) on 08/20/2016.  He has urinary retention.  Renal ultrasound on 07/28/2016 revealed bilateral hydronephrosis (right > left) and a large postvoid residual (2 liters) suggesting bladder dysfunction or outlet obstruction.  He underwent temporary Foley catheter placement.  He performs I/O self catheterizations.  He is on Flomax and finasteride.  PSA was 2.02 on 07/09/2016.  He has chronic renal insufficiency (Cr 1.85; CrCl 36 ml/min).  SPEP on 09/23/2016 was negative.  Spot urine revealed 17.4% of 27.3 mg/dL.of a monoclonal protein.  24 hour urine on 10/16/2016 revealed no monoclonal protein.  He is followed by nephrology and urology.  Symptomatically, he feels good.  Exam is stable.  Plan: 1.  Review [facility] labs. 2.  Discuss plans for future colonoscopy.  Check counts prior to procedure. 3.  Continue Jakafi. 4.  Continue ferrous sulfate 325 mg po q Mondays, Wednesday, Fridays. 5.  RTC on Monday for labs (BMP, LDH, triglycerides). 6.  RTC in 2 months for MD assessment and labs (CBC with diff, CMP, LDH, uric acid).  AddendumShanon Brow will be increased from 20 mg BID to 25 mg BID at next Rx refill.  Counts will be monitored closely.   Lequita Asal, MD  12/31/2016 , 3:03 PM

## 2017-01-02 ENCOUNTER — Other Ambulatory Visit: Payer: Self-pay | Admitting: *Deleted

## 2017-01-02 MED ORDER — ALLOPURINOL 300 MG PO TABS: 300.0000 mg | ORAL_TABLET | Freq: Every day | ORAL | 0 refills | Status: DC

## 2017-01-02 NOTE — Telephone Encounter (Signed)
Requesting 90 day supply.

## 2017-01-05 ENCOUNTER — Inpatient Hospital Stay: Payer: Medicare Other

## 2017-01-05 DIAGNOSIS — N1330 Unspecified hydronephrosis: Secondary | ICD-10-CM | POA: Diagnosis not present

## 2017-01-05 DIAGNOSIS — N400 Enlarged prostate without lower urinary tract symptoms: Secondary | ICD-10-CM | POA: Diagnosis not present

## 2017-01-05 DIAGNOSIS — D7581 Myelofibrosis: Secondary | ICD-10-CM | POA: Diagnosis not present

## 2017-01-05 DIAGNOSIS — K5090 Crohn's disease, unspecified, without complications: Secondary | ICD-10-CM | POA: Diagnosis not present

## 2017-01-05 DIAGNOSIS — N189 Chronic kidney disease, unspecified: Secondary | ICD-10-CM | POA: Diagnosis not present

## 2017-01-05 DIAGNOSIS — D45 Polycythemia vera: Secondary | ICD-10-CM

## 2017-01-05 LAB — BASIC METABOLIC PANEL
Anion gap: 3 — ABNORMAL LOW (ref 5–15)
BUN: 27 mg/dL — ABNORMAL HIGH (ref 6–20)
CO2: 25 mmol/L (ref 22–32)
Calcium: 8.8 mg/dL — ABNORMAL LOW (ref 8.9–10.3)
Chloride: 107 mmol/L (ref 101–111)
Creatinine, Ser: 1.67 mg/dL — ABNORMAL HIGH (ref 0.61–1.24)
GFR calc Af Amer: 47 mL/min — ABNORMAL LOW (ref >60)
GFR calc non Af Amer: 40 mL/min — ABNORMAL LOW (ref >60)
Glucose, Bld: 93 mg/dL (ref 65–99)
Potassium: 4 mmol/L (ref 3.5–5.1)
Sodium: 135 mmol/L (ref 135–145)

## 2017-01-05 LAB — TRIGLYCERIDES: Triglycerides: 180 mg/dL — ABNORMAL HIGH (ref <150)

## 2017-01-05 LAB — LACTATE DEHYDROGENASE: LDH: 638 U/L — ABNORMAL HIGH (ref 98–192)

## 2017-01-08 ENCOUNTER — Ambulatory Visit: Payer: Medicare Other

## 2017-01-08 ENCOUNTER — Encounter: Payer: Medicare Other | Admitting: Family Medicine

## 2017-02-20 ENCOUNTER — Ambulatory Visit (INDEPENDENT_AMBULATORY_CARE_PROVIDER_SITE_OTHER): Payer: Medicare Other

## 2017-02-20 ENCOUNTER — Encounter: Payer: Self-pay | Admitting: Family Medicine

## 2017-02-20 ENCOUNTER — Ambulatory Visit (INDEPENDENT_AMBULATORY_CARE_PROVIDER_SITE_OTHER): Payer: Medicare Other | Admitting: Family Medicine

## 2017-02-20 VITALS — BP 144/72 | HR 68 | Temp 98.7°F | Ht 72.0 in | Wt 183.2 lb

## 2017-02-20 VITALS — BP 130/68

## 2017-02-20 DIAGNOSIS — E790 Hyperuricemia without signs of inflammatory arthritis and tophaceous disease: Secondary | ICD-10-CM | POA: Diagnosis not present

## 2017-02-20 DIAGNOSIS — Z0000 Encounter for general adult medical examination without abnormal findings: Secondary | ICD-10-CM

## 2017-02-20 DIAGNOSIS — D45 Polycythemia vera: Secondary | ICD-10-CM | POA: Diagnosis not present

## 2017-02-20 DIAGNOSIS — R972 Elevated prostate specific antigen [PSA]: Secondary | ICD-10-CM

## 2017-02-20 DIAGNOSIS — N289 Disorder of kidney and ureter, unspecified: Secondary | ICD-10-CM

## 2017-02-20 DIAGNOSIS — D7581 Myelofibrosis: Secondary | ICD-10-CM

## 2017-02-20 NOTE — Progress Notes (Signed)
Patient: Ruben [name] Male    DOB: November 03, 1945   71 y.o.   MRN: [id number] Visit Date: 02/20/2017  Today's Provider: Lelon Huh, MD   Chief Complaint  Patient presents with  . Follow-up   Subjective:    HPI Follow up of Hyperuricemia:  Patient was last seen for this problem 09/05/2015. 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 He has had no additional episodes of hematuria and is now follwed by Dr. Mike Gip for PCV and Dr. Erlene Quan for urinary retention. Is up to date on all labs including uric acid levels.  Lab Results  Component Value Date   LABURIC 5.9 08/20/2016   Lab Results  Component Value Date   WBC 22.8 (H) 12/03/2016   HGB 11.0 (L) 12/03/2016   HCT 35.9 (L) 12/03/2016   MCV 72.0 (L) 12/03/2016   PLT 407 12/03/2016   CMP Latest Ref Rng & Units 01/05/2017 12/03/2016 11/26/2016  Glucose 65 - 99 mg/dL 93 115(H) 88  BUN 6 - 20 mg/dL 27(H) 26(H) 27(H)  Creatinine 0.61 - 1.24 mg/dL 1.67(H) 1.74(H) 1.73(H)  Sodium 135 - 145 mmol/L 135 136 136  Potassium 3.5 - 5.1 mmol/L 4.0 4.4 4.2  Chloride 101 - 111 mmol/L 107 105 105  CO2 22 - 32 mmol/L 25 25 23   Calcium 8.9 - 10.3 mg/dL 8.8(L) 9.0 8.8(L)  Total Protein 6.5 - 8.1 g/dL - - -  Total Bilirubin 0.3 - 1.2 mg/dL - - -  Alkaline Phos 38 - 126 U/L - - -  AST 15 - 41 U/L - - -  ALT 17 - 63 U/L - - -       No Known Allergies   Current Outpatient Prescriptions:  .  allopurinol (ZYLOPRIM) 300 MG tablet, Take 1 tablet (300 mg total) by mouth daily., Disp: 90 tablet, Rfl: 0 .  aspirin 81 MG tablet, Take 81 mg by mouth daily., Disp: , Rfl:  .  Cholecalciferol (VITAMIN D3) 1000 units CAPS, Take 1 capsule by mouth daily., Disp: , Rfl:  .  Cyanocobalamin (RA VITAMIN B-12 TR) 1000 MCG TBCR, , Disp: , Rfl:  .  ferrous sulfate 325 (65 FE) MG tablet, Take 325 mg by mouth 3 (three) times a week., Disp: , Rfl:    .  finasteride (PROSCAR) 5 MG tablet, Take 1 tablet (5 mg total) by mouth daily., Disp: 90 tablet, Rfl: 3 .  folic acid (FOLVITE) 1 MG tablet, TAKE 1 TABLET (1,000 MCG TOTAL) BY MOUTH ONCE DAILY. MUST KEEP APPT FOR FURTHER REFILLS., Disp: , Rfl:  .  latanoprost (XALATAN) 0.005 % ophthalmic solution, Apply 1 drop to eye daily. , Disp: , Rfl:  .  Multiple Vitamins-Minerals (CENTRUM SILVER ULTRA MENS) TABS, Take by mouth. Reported on 01/30/2016, Disp: , Rfl:  .  Omega-3 Fatty Acids (KP FISH OIL) 1200 MG CAPS, Take by mouth., Disp: , Rfl:  .  ruxolitinib phosphate (JAKAFI) 20 MG tablet, Take 1 tablet (20 mg total) by mouth 2 (two) times daily., Disp: 60 tablet, Rfl: 3 .  tamsulosin (FLOMAX) 0.4 MG CAPS capsule, Take 1 capsule (0.4 mg total) by mouth daily., Disp: 30 capsule, Rfl: 11 .  timolol (BETIMOL) 0.25 % ophthalmic solution, Place 1 drop into both eyes daily. , Disp: , Rfl:   Review of Systems  Constitutional: Negative for appetite change, chills, fatigue and fever.  HENT: Negative for congestion,  ear pain, hearing loss, nosebleeds and trouble swallowing.   Eyes: Negative for pain and visual disturbance.  Respiratory: Negative for cough, chest tightness and shortness of breath.   Cardiovascular: Negative for chest pain, palpitations and leg swelling.  Gastrointestinal: Negative for abdominal pain, blood in stool, constipation, diarrhea, nausea and vomiting.  Endocrine: Negative for polydipsia, polyphagia and polyuria.  Genitourinary: Negative for dysuria and flank pain.  Musculoskeletal: Negative for arthralgias, back pain, joint swelling, myalgias and neck stiffness.  Skin: Negative for color change, rash and wound.  Neurological: Negative for dizziness, tremors, seizures, speech difficulty, weakness, light-headedness and headaches.  Psychiatric/Behavioral: Negative for behavioral problems, confusion, decreased concentration, dysphoric mood and sleep disturbance. The patient is not  nervous/anxious.   All other systems reviewed and are negative.   Social History  Substance Use Topics  . Smoking status: Former Smoker    Packs/day: 0.25    Years: 20.00    Types: Cigarettes    Quit date: 09/28/2003  . Smokeless tobacco: Never Used  . Alcohol use 0.6 oz/week    1 Glasses of wine per week   Objective:    Vital Signs - Last Recorded  Most recent update: 02/20/2017 1:37 PM by Fabio Neighbors, LPN  BP    326/71 (BP Location: Right Arm)     Pulse  68     Temp  98.7 F (37.1 C) (Oral)     Ht  6' (1.829 m)     Wt  183 lb 3.2 oz (83.1 kg)      BMI  24.85 kg/m      Vitals:   02/20/17 1402  BP: 130/68     Physical Exam   General Appearance:    Alert, cooperative, no distress  Eyes:    PERRL, conjunctiva/corneas clear, EOM's intact       Lungs:     Clear to auscultation bilaterally, respirations unlabored  Heart:    Regular rate and rhythm  Neurologic:   Awake, alert, oriented x 3. No apparent focal neurological           defect.       Patient Care Team    Relationship Specialty Notifications Start End  [name], MD PCP - General Family Medicine  08/27/15   [name], MD  Gastroenterology  08/23/15    Comment: Charyl Bigger, MD  Ophthalmology  08/23/15    Comment: Ophthalmology  Lequita Asal, MD Referring Physician Hematology and Oncology  09/03/15   Dasher, Rayvon Char, MD Referring Physician Dermatology  09/03/15   Anthonette Legato, MD Consulting Physician Internal Medicine  10/15/16   Hollice Espy, MD Consulting Physician Urology  02/20/17         Assessment & Plan:      1. Elevated prostate specific antigen (PSA) Followed by Dr. Erlene Quan   2. Polycythemia vera ([place]) Stable. Continue follow up Dr. Gerrit Friends.   3. Hyperuricemia .stable on allopurinol. Continue follow up Dr. 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 Renal insufficiency        Lelon Huh, MD  [facility]

## 2017-02-20 NOTE — Progress Notes (Signed)
Subjective:   Ruben [name] is a 71 y.o. male who presents for Medicare Annual/Subsequent preventive examination.  Review of Systems:  N/A  Cardiac Risk Factors include: advanced age (>22mn, >>56women);male gender;sedentary lifestyle     Objective:    Vitals: BP (!) 144/72 (BP Location: Right Arm)   Pulse 68   Temp 98.7 F (37.1 C) (Oral)   Ht 6' (1.829 m)   Wt 183 lb 3.2 oz (83.1 kg)   BMI 24.85 kg/m   Body mass index is 24.85 kg/m.  Tobacco History  Smoking Status  . Former Smoker  . Packs/day: 0.25  . Years: 20.00  . Types: Cigarettes  . Quit date: 09/28/2003  Smokeless Tobacco  . Never Used     Counseling given: Not Answered   Past Medical History:  Diagnosis Date  . BPH (benign prostatic hyperplasia)   . Crohn disease ([place]   . Crohn's disease ([place]   . Glaucoma   . Glaucoma   . Gout   . History of chicken pox   . RBBB   . Right bundle branch block    Past Surgical History:  Procedure Laterality Date  . PROSTATE SURGERY  2008   Prostate Biopsy in [place] to elevated PSA.  per patient normal  . Skin Lesion Basal cell removed    . wart removal     from eyelid   Family History  Problem Relation Age of Onset  . Cancer Mother        Liver  . Cancer Father        Prostate  . Cancer Sister        Hodgkins lymphoma  . Cancer Paternal Aunt        Breast   History  Sexual Activity  . Sexual activity: Not on file    Outpatient Encounter Prescriptions as of 02/20/2017  Medication Sig  . allopurinol (ZYLOPRIM) 300 MG tablet Take 1 tablet (300 mg total) by mouth daily.  .Marland Kitchenaspirin 81 MG tablet Take 81 mg by mouth daily.  . Cholecalciferol (VITAMIN D3) 1000 units CAPS Take 1 capsule by mouth daily.  . Cyanocobalamin (RA VITAMIN B-12 TR) 1000 MCG TBCR   . ferrous sulfate 325 (65 FE) MG tablet Take 325 mg by mouth 3 (three) times a week.  . finasteride (PROSCAR) 5 MG tablet Take 1 tablet (5 mg total) by mouth daily.  . folic acid  (FOLVITE) 1 MG tablet TAKE 1 TABLET (1,000 MCG TOTAL) BY MOUTH ONCE DAILY. MUST KEEP APPT FOR FURTHER REFILLS.  .Marland Kitchenlatanoprost (XALATAN) 0.005 % ophthalmic solution Apply 1 drop to eye daily.   . Multiple Vitamins-Minerals (CENTRUM SILVER ULTRA MENS) TABS Take by mouth. Reported on 01/30/2016  . Omega-3 Fatty Acids (KP FISH OIL) 1200 MG CAPS Take by mouth.  . ruxolitinib phosphate (JAKAFI) 20 MG tablet Take 1 tablet (20 mg total) by mouth 2 (two) times daily.  . tamsulosin (FLOMAX) 0.4 MG CAPS capsule Take 1 capsule (0.4 mg total) by mouth daily.  . timolol (BETIMOL) 0.25 % ophthalmic solution Place 1 drop into both eyes daily.    No facility-administered encounter medications on file as of 02/20/2017.     Activities of Daily Living In your present state of health, do you have any difficulty performing the following activities: 02/20/2017  Hearing? N  Vision? N  Difficulty concentrating or making decisions? N  Walking or climbing stairs? N  Dressing or bathing? N  Doing errands, shopping? N  Preparing  Food and eating ? N  Using the Toilet? N  In the past six months, have you accidently leaked urine? N  Do you have problems with loss of bowel control? N  Managing your Medications? N  Managing your Finances? N  Housekeeping or managing your Housekeeping? N  Some recent data might be hidden    Patient Care Team: [name], MD as PCP - General (Family Medicine) [name], MD (Gastroenterology) Dingeldein, Remo Lipps, MD (Ophthalmology) Lequita Asal, MD as Referring Physician (Hematology and Oncology) Dasher, Rayvon Char, MD as Referring Physician (Dermatology) Anthonette Legato, MD as Consulting Physician (Internal Medicine) Hollice Espy, MD as Consulting Physician (Urology)   Assessment:     Exercise Activities and Dietary recommendations Current Exercise Habits: Home exercise routine, Type of exercise: walking, Time (Minutes): 30, Frequency (Times/Week): 5, Weekly  Exercise (Minutes/Week): 150, Intensity: Mild, Exercise limited by: None identified  Goals    . Increase water intake          Recommend increasing water intake to 4-6 glasses a day.      Fall Risk Fall Risk  02/20/2017 08/27/2015  Falls in the past year? Yes No  Number falls in past yr: 1 -  Injury with Fall? No -  Follow up Falls prevention discussed -   Depression Screen PHQ 2/9 Scores 02/20/2017 02/20/2017 08/27/2015  PHQ - 2 Score 0 0 0  PHQ- 9 Score 0 - -    Cognitive Function     6CIT Screen 02/20/2017  What Year? 0 points  What month? 0 points  What time? 0 points  Count back from 20 0 points  Months in reverse 0 points  Repeat phrase 2 points  Total Score 2    Immunization History  Administered Date(s) Administered  . Pneumococcal Conjugate-13 04/10/2014  . Pneumococcal Polysaccharide-23 05/03/2012  . Td 02/11/2002  . Zoster 07/10/2014   Screening Tests Health Maintenance  Topic Date Due  . INFLUENZA VACCINE  04/22/2017  . TETANUS/TDAP  09/22/2020  . COLONOSCOPY  08/07/2021  . Hepatitis C Screening  Completed  . PNA vac Low Risk Adult  Completed      Plan:  I have personally reviewed and addressed the Medicare Annual Wellness questionnaire and have noted the following in the patient's chart:  A. Medical and social history B. Use of alcohol, tobacco or illicit drugs  C. Current medications and supplements D. Functional ability and status E.  Nutritional status F.  Physical activity G. Advance directives H. List of other physicians I.  Hospitalizations, surgeries, and ER visits in previous 12 months J.  [place] such as hearing and vision if needed, cognitive and depression L. Referrals and appointments - none  In addition, I have reviewed and discussed with patient certain preventive protocols, quality metrics, and best practice recommendations. A written personalized care plan for preventive services as well as general preventive health  recommendations were provided to patient.  See attached scanned questionnaire for additional information.   Signed,  Fabio Neighbors, LPN Nurse Health Advisor   MD Recommendations: None.

## 2017-02-20 NOTE — Patient Instructions (Signed)
Mr. Ruben [name] , Thank you for taking time to come for your Medicare Wellness Visit. I appreciate your ongoing commitment to your health goals. Please review the following plan we discussed and let me know if I can assist you in the future.   Screening recommendations/referrals: Colonoscopy: completed 08/08/11, due 07/2021 Recommended yearly ophthalmology/optometry visit for glaucoma screening and checkup Recommended yearly dental visit for hygiene and checkup  Vaccinations: Influenza vaccine: due 05/2017 Pneumococcal vaccine: completed series Tdap vaccine: completed 09/2010, due 09/2020 Shingles vaccine: completed 06/30/14  Advanced directives: LW and POA already on file  Conditions/risks identified: Fall risk prevention; Recommend increasing water intake to 4-6 glasses a day.  Next appointment: None, need to schedule 1 year AWV.  Preventive Care 26 Years and Older, Male Preventive care refers to lifestyle choices and visits with your health care provider that can promote health and wellness. What does preventive care include?  A yearly physical exam. This is also called an annual well check.  Dental exams once or twice a year.  Routine eye exams. Ask your health care provider how often you should have your eyes checked.  Personal lifestyle choices, including:  Daily care of your teeth and gums.  Regular physical activity.  Eating a healthy diet.  Avoiding tobacco and drug use.  Limiting alcohol use.  Practicing safe sex.  Taking low doses of aspirin every day.  Taking vitamin and mineral supplements as recommended by your health care provider. What happens during an annual well check? The services and screenings done by your health care provider during your annual well check will depend on your age, overall health, lifestyle risk factors, and family history of disease. Counseling  Your health care provider may ask you questions about your:  Alcohol use.  Tobacco  use.  Drug use.  Emotional well-being.  Home and relationship well-being.  Sexual activity.  Eating habits.  History of falls.  Memory and ability to understand (cognition).  Work and work Statistician. Screening  You may have the following tests or measurements:  Height, weight, and BMI.  Blood pressure.  Lipid and cholesterol levels. These may be checked every 5 years, or more frequently if you are over 93 years old.  Skin check.  Lung cancer screening. You may have this screening every year starting at age 82 if you have a 30-pack-year history of smoking and currently smoke or have quit within the past 15 years.  Fecal occult blood test (FOBT) of the stool. You may have this test every year starting at age 33.  Flexible sigmoidoscopy or colonoscopy. You may have a sigmoidoscopy every 5 years or a colonoscopy every 10 years starting at age 50.  Prostate cancer screening. Recommendations will vary depending on your family history and other risks.  Hepatitis C blood test.  Hepatitis B blood test.  Sexually transmitted disease (STD) testing.  Diabetes screening. This is done by checking your blood sugar (glucose) after you have not eaten for a while (fasting). You may have this done every 1-3 years.  Abdominal aortic aneurysm (AAA) screening. You may need this if you are a current or former smoker.  Osteoporosis. You may be screened starting at age 20 if you are at high risk. Talk with your health care provider about your test results, treatment options, and if necessary, the need for more tests. Vaccines  Your health care provider may recommend certain vaccines, such as:  Influenza vaccine. This is recommended every year.  Tetanus, diphtheria, and acellular pertussis (Tdap,  Td) vaccine. You may need a Td booster every 10 years.  Zoster vaccine. You may need this after age 24.  Pneumococcal 13-valent conjugate (PCV13) vaccine. One dose is recommended after age  51.  Pneumococcal polysaccharide (PPSV23) vaccine. One dose is recommended after age 53. Talk to your health care provider about which screenings and vaccines you need and how often you need them. This information is not intended to replace advice given to you by your health care provider. Make sure you discuss any questions you have with your health care provider. Document Released: 10/05/2015 Document Revised: 05/28/2016 Document Reviewed: 07/10/2015 Elsevier Interactive Patient Education  2017 [employer] Prevention in the Home Falls can cause injuries. They can happen to people of all ages. There are many things you can do to make your home safe and to help prevent falls. What can I do on the outside of my home?  Regularly fix the edges of walkways and driveways and fix any cracks.  Remove anything that might make you trip as you walk through a door, such as a raised step or threshold.  Trim any bushes or trees on the path to your home.  Use bright outdoor lighting.  Clear any walking paths of anything that might make someone trip, such as rocks or tools.  Regularly check to see if handrails are loose or broken. Make sure that both sides of any steps have handrails.  Any raised decks and porches should have guardrails on the edges.  Have any leaves, snow, or ice cleared regularly.  Use sand or salt on walking paths during winter.  Clean up any spills in your garage right away. This includes oil or grease spills. What can I do in the bathroom?  Use night lights.  Install grab bars by the toilet and in the tub and shower. Do not use towel bars as grab bars.  Use non-skid mats or decals in the tub or shower.  If you need to sit down in the shower, use a plastic, non-slip stool.  Keep the floor dry. Clean up any water that spills on the floor as soon as it happens.  Remove soap buildup in the tub or shower regularly.  Attach bath mats securely with double-sided  non-slip rug tape.  Do not have throw rugs and other things on the floor that can make you trip. What can I do in the bedroom?  Use night lights.  Make sure that you have a light by your bed that is easy to reach.  Do not use any sheets or blankets that are too big for your bed. They should not hang down onto the floor.  Have a firm chair that has side arms. You can use this for support while you get dressed.  Do not have throw rugs and other things on the floor that can make you trip. What can I do in the kitchen?  Clean up any spills right away.  Avoid walking on wet floors.  Keep items that you use a lot in easy-to-reach places.  If you need to reach something above you, use a strong step stool that has a grab bar.  Keep electrical cords out of the way.  Do not use floor polish or wax that makes floors slippery. If you must use wax, use non-skid floor wax.  Do not have throw rugs and other things on the floor that can make you trip. What can I do with my stairs?  Do not  leave any items on the stairs.  Make sure that there are handrails on both sides of the stairs and use them. Fix handrails that are broken or loose. Make sure that handrails are as long as the stairways.  Check any carpeting to make sure that it is firmly attached to the stairs. Fix any carpet that is loose or worn.  Avoid having throw rugs at the top or bottom of the stairs. If you do have throw rugs, attach them to the floor with carpet tape.  Make sure that you have a light switch at the top of the stairs and the bottom of the stairs. If you do not have them, ask someone to add them for you. What else can I do to help prevent falls?  Wear shoes that:  Do not have high heels.  Have rubber bottoms.  Are comfortable and fit you well.  Are closed at the toe. Do not wear sandals.  If you use a stepladder:  Make sure that it is fully opened. Do not climb a closed stepladder.  Make sure that both  sides of the stepladder are locked into place.  Ask someone to hold it for you, if possible.  Clearly mark and make sure that you can see:  Any grab bars or handrails.  First and last steps.  Where the edge of each step is.  Use tools that help you move around (mobility aids) if they are needed. These include:  Canes.  Walkers.  Scooters.  Crutches.  Turn on the lights when you go into a dark area. Replace any light bulbs as soon as they burn out.  Set up your furniture so you have a clear path. Avoid moving your furniture around.  If any of your floors are uneven, fix them.  If there are any pets around you, be aware of where they are.  Review your medicines with your doctor. Some medicines can make you feel dizzy. This can increase your chance of falling. Ask your doctor what other things that you can do to help prevent falls. This information is not intended to replace advice given to you by your health care provider. Make sure you discuss any questions you have with your health care provider. Document Released: 07/05/2009 Document Revised: 02/14/2016 Document Reviewed: 10/13/2014 Elsevier Interactive Patient Education  2017 [employer].

## 2017-02-25 ENCOUNTER — Inpatient Hospital Stay: Payer: Medicare Other | Attending: Hematology and Oncology | Admitting: Hematology and Oncology

## 2017-02-25 ENCOUNTER — Inpatient Hospital Stay: Payer: Medicare Other

## 2017-02-25 ENCOUNTER — Encounter: Payer: Self-pay | Admitting: Hematology and Oncology

## 2017-02-25 VITALS — BP 114/65 | HR 64 | Temp 96.8°F | Resp 18 | Wt 180.8 lb

## 2017-02-25 DIAGNOSIS — I4510 Unspecified right bundle-branch block: Secondary | ICD-10-CM | POA: Insufficient documentation

## 2017-02-25 DIAGNOSIS — Z8042 Family history of malignant neoplasm of prostate: Secondary | ICD-10-CM | POA: Insufficient documentation

## 2017-02-25 DIAGNOSIS — Z148 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: Secondary | ICD-10-CM | POA: Diagnosis not present

## 2017-02-25 DIAGNOSIS — D7581 Myelofibrosis: Secondary | ICD-10-CM

## 2017-02-25 DIAGNOSIS — D45 Polycythemia vera: Secondary | ICD-10-CM

## 2017-02-25 DIAGNOSIS — M109 Gout, unspecified: Secondary | ICD-10-CM | POA: Insufficient documentation

## 2017-02-25 DIAGNOSIS — N400 Enlarged prostate without lower urinary tract symptoms: Secondary | ICD-10-CM | POA: Diagnosis not present

## 2017-02-25 DIAGNOSIS — Z803 Family history of malignant neoplasm of breast: Secondary | ICD-10-CM | POA: Diagnosis not present

## 2017-02-25 DIAGNOSIS — N401 Enlarged prostate with lower urinary tract symptoms: Secondary | ICD-10-CM | POA: Diagnosis not present

## 2017-02-25 DIAGNOSIS — Z79899 Other long term (current) drug therapy: Secondary | ICD-10-CM | POA: Diagnosis not present

## 2017-02-25 DIAGNOSIS — Z801 Family history of malignant neoplasm of trachea, bronchus and lung: Secondary | ICD-10-CM | POA: Diagnosis not present

## 2017-02-25 DIAGNOSIS — Z809 Family history of malignant neoplasm, unspecified: Secondary | ICD-10-CM | POA: Diagnosis not present

## 2017-02-25 DIAGNOSIS — Z87891 Personal history of nicotine dependence: Secondary | ICD-10-CM

## 2017-02-25 DIAGNOSIS — K5090 Crohn's disease, unspecified, without complications: Secondary | ICD-10-CM | POA: Diagnosis not present

## 2017-02-25 DIAGNOSIS — Z7982 Long term (current) use of aspirin: Secondary | ICD-10-CM | POA: Diagnosis not present

## 2017-02-25 LAB — COMPREHENSIVE METABOLIC PANEL
ALT: 17 U/L (ref 17–63)
AST: 38 U/L (ref 15–41)
Albumin: 4.2 g/dL (ref 3.5–5.0)
Alkaline Phosphatase: 59 U/L (ref 38–126)
Anion gap: 7 (ref 5–15)
BUN: 31 mg/dL — ABNORMAL HIGH (ref 6–20)
CO2: 23 mmol/L (ref 22–32)
Calcium: 8.9 mg/dL (ref 8.9–10.3)
Chloride: 105 mmol/L (ref 101–111)
Creatinine, Ser: 1.76 mg/dL — ABNORMAL HIGH (ref 0.61–1.24)
GFR calc Af Amer: 44 mL/min — ABNORMAL LOW (ref >60)
GFR calc non Af Amer: 38 mL/min — ABNORMAL LOW (ref >60)
Glucose, Bld: 110 mg/dL — ABNORMAL HIGH (ref 65–99)
Potassium: 4.3 mmol/L (ref 3.5–5.1)
Sodium: 135 mmol/L (ref 135–145)
Total Bilirubin: 0.4 mg/dL (ref 0.3–1.2)
Total Protein: 7.7 g/dL (ref 6.5–8.1)

## 2017-02-25 LAB — CBC WITH DIFFERENTIAL/PLATELET
Basophils Absolute: 0.1 10*3/uL (ref 0–0.1)
Basophils Relative: 0 %
Eosinophils Absolute: 0.1 10*3/uL (ref 0–0.7)
Eosinophils Relative: 1 %
HCT: 35.9 % — ABNORMAL LOW (ref 40.0–52.0)
Hemoglobin: 11.4 g/dL — ABNORMAL LOW (ref 13.0–18.0)
Lymphocytes Relative: 9 %
Lymphs Abs: 1.9 10*3/uL (ref 1.0–3.6)
MCH: 23 pg — ABNORMAL LOW (ref 26.0–34.0)
MCHC: 31.8 g/dL — ABNORMAL LOW (ref 32.0–36.0)
MCV: 72.3 fL — ABNORMAL LOW (ref 80.0–100.0)
Monocytes Absolute: 3.7 10*3/uL — ABNORMAL HIGH (ref 0.2–1.0)
Monocytes Relative: 18 %
Neutro Abs: 14.4 10*3/uL — ABNORMAL HIGH (ref 1.4–6.5)
Neutrophils Relative %: 72 %
Platelets: 382 10*3/uL (ref 150–440)
RBC: 4.97 MIL/uL (ref 4.40–5.90)
RDW: 20.6 % — ABNORMAL HIGH (ref 11.5–14.5)
WBC: 20.2 10*3/uL — ABNORMAL HIGH (ref 3.8–10.6)

## 2017-02-25 LAB — URIC ACID: Uric Acid, Serum: 5 mg/dL (ref 4.4–7.6)

## 2017-02-25 LAB — LACTATE DEHYDROGENASE: LDH: 625 U/L — ABNORMAL HIGH (ref 98–192)

## 2017-02-25 NOTE — Progress Notes (Signed)
[facility] day:  02/25/2017  Chief Complaint: Ruben [name] is a 71 y.o. 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  HPI: The patient was last seen in the medical oncology clinic on 12/31/2016.  At that time, he felt good.  Exam was stable.  Labs from [facility] on 12/29/2016 revealed a hematocrit of 37.1, hemoglobin 11.3, MCV 77.8, platelets 493,000, white count 20,700 with an ANC of 15,700.  Differential revealed 71% segs, 9% lymphs, 18% monocytes, 1% eosinophils, and 1% basophils.  Potassium was 5.7. Creatinine was 1.84.  Liver function tests were normal.  Plan was to increase his Jakafi to 25 mg twice a day.  He was to continue ferrous sulfate 325 mg 3 times a week.    During the interim, he has felt well.  He denies any symptoms.  Patient is scheduled for colonoscopy.   Past Medical History:  Diagnosis Date  . BPH (benign prostatic hyperplasia)   . Crohn disease ([place])   . Crohn's disease ([place])   . Glaucoma   . Glaucoma   . Gout   . History of chicken pox   . RBBB   . Right bundle branch block     Past Surgical History:  Procedure Laterality Date  . PROSTATE SURGERY  2008   Prostate Biopsy in [place] to elevated PSA.  per patient normal  . Skin Lesion Basal cell removed    . wart removal     from eyelid    Family History  Problem Relation Age of Onset  . Cancer Mother        Liver  . Cancer Father        Prostate  . Cancer Sister        Hodgkins lymphoma  . Cancer Paternal Aunt        Breast    Social History:  reports that he quit smoking about 13 years ago. His smoking use included Cigarettes. He has a 5.00 pack-year smoking history. He has never used smokeless tobacco. He reports that he drinks about 0.6 oz of alcohol per week . He reports that he does not use drugs.  He previously worked for [employer].  He volunteers for [employer].  He will be travelling to [facility] from  08/13/2016 - 08/17/2016.  The patient is accompanied by his wife, [name], today.  Allergies: No Known Allergies  Current Medications: Current Outpatient Prescriptions  Medication Sig Dispense Refill  . allopurinol (ZYLOPRIM) 300 MG tablet Take 1 tablet (300 mg total) by mouth daily. 90 tablet 0  . aspirin 81 MG tablet Take 81 mg by mouth daily.    . Cholecalciferol (VITAMIN D3) 1000 units CAPS Take 1 capsule by mouth daily.    . Cyanocobalamin (RA VITAMIN B-12 TR) 1000 MCG TBCR     . ferrous sulfate 325 (65 FE) MG tablet Take 325 mg by mouth 3 (three) times a week.    . finasteride (PROSCAR) 5 MG tablet Take 1 tablet (5 mg total) by mouth daily. 90 tablet 3  . folic acid (FOLVITE) 1 MG tablet TAKE 1 TABLET (1,000 MCG TOTAL) BY MOUTH ONCE DAILY. MUST KEEP APPT FOR FURTHER REFILLS.    Marland Kitchen latanoprost (XALATAN) 0.005 % ophthalmic solution Apply 1 drop to eye daily.     . Multiple Vitamins-Minerals (CENTRUM SILVER ULTRA MENS) TABS Take by mouth. Reported on 01/30/2016    . Omega-3 Fatty Acids (KP FISH  OIL) 1200 MG CAPS Take by mouth.    . ruxolitinib phosphate (JAKAFI) 20 MG tablet Take 1 tablet (20 mg total) by mouth 2 (two) times daily. 60 tablet 3  . tamsulosin (FLOMAX) 0.4 MG CAPS capsule Take 1 capsule (0.4 mg total) by mouth daily. 30 capsule 11  . timolol (BETIMOL) 0.25 % ophthalmic solution Place 1 drop into both eyes daily.      No current facility-administered medications for this visit.     Review of Systems:  GENERAL:  Feels "good".  No fevers or sweats.  Weight down 4 pounds. PERFORMANCE STATUS (ECOG):  1 HEENT:  No visual changes, runny nose, sore throat, mouth sores or tenderness. Lungs: No shortness of breath or cough.  No hemoptysis. Cardiac:  No chest pain, palpitations, orthopnea, or PND. GI:  Diarrhea (chronic).  No nausea, vomiting, constipation, melena or hematochezia. GU:  Self catheterization 4x/day.  Typical volume 300-400 cc (800 cc in AM).  No urgency, dysuria, or  hematuria. Musculoskeletal:  No back pain. No muscle tenderness. Extremities:  No bone pain.  No joint pain or swelling. Skin:  No rashes or skin changes. Neuro:  No headache, numbness or weakness, balance or coordination issues. Endocrine:  No diabetes, thyroid issues, hot flashes or night sweats. Psych:  No mood changes, depression or anxiety. Pain:  No pain. Review of systems:  All other systems reviewed and found to be negative.   Physical Exam: Blood pressure 114/65, pulse 64, temperature (!) 96.8 F (36 C), temperature source Tympanic, resp. rate 18, weight 180 lb 12.4 oz (82 kg). GENERAL:  Well developed, well nourished, gentleman sitting comfortably in the exam room in no acute distress. MENTAL STATUS:  Alert and oriented to person, place and time. HEAD:  Pearline Cables hair.  Normocephalic, atraumatic, face symmetric, no Cushingoid features. EYES: Glasses. Blue eyes. Pupils equal round and reactive to light and accomodation.  No conjunctivitis or scleral icterus. ENT: Oropharynx clear without lesion. Tongue normal. Mucous membranes moist.  RESPIRATORY: Clear to auscultation without rales, wheezes or rhonchi. CARDIOVASCULAR: Regular rate and rhythm without murmur, rub or gallop. ABDOMEN: Soft, non-tender, with active bowel sounds, and no appreciable hepatosplenomegaly.  No masses.  SKIN: No rashes, ulcers or lesions. EXTREMITIES: No edema, no skin discoloration or tenderness. No palpable cords. LYMPH NODES: No palpable cervical, supraclavicular, axillary or inguinal adenopathy  NEUROLOGICAL: Unremarkable. PSYCH: Appropriate.    Appointment on 02/25/2017  Component Date Value Ref Range Status  . WBC 02/25/2017 20.2* 3.8 - 10.6 K/uL Final   SMEAR REVIEWED  . RBC 02/25/2017 4.97  4.40 - 5.90 MIL/uL Final  . Hemoglobin 02/25/2017 11.4* 13.0 - 18.0 g/dL Final  . HCT 02/25/2017 35.9* 40.0 - 52.0 % Final  . MCV 02/25/2017 72.3* 80.0 - 100.0 fL Final  . MCH 02/25/2017 23.0*  26.0 - 34.0 pg Final  . MCHC 02/25/2017 31.8* 32.0 - 36.0 g/dL Final  . RDW 02/25/2017 20.6* 11.5 - 14.5 % Final  . Platelets 02/25/2017 382  150 - 440 K/uL Final  . Neutrophils Relative % 02/25/2017 72  % Final  . Neutro Abs 02/25/2017 14.4* 1.4 - 6.5 K/uL Final  . Lymphocytes Relative 02/25/2017 9  % Final  . Lymphs Abs 02/25/2017 1.9  1.0 - 3.6 K/uL Final  . Monocytes Relative 02/25/2017 18  % Final  . Monocytes Absolute 02/25/2017 3.7* 0.2 - 1.0 K/uL Final  . Eosinophils Relative 02/25/2017 1  % Final  . Eosinophils Absolute 02/25/2017 0.1  0 - 0.7 K/uL  Final  . Basophils Relative 02/25/2017 0  % Final  . Basophils Absolute 02/25/2017 0.1  0 - 0.1 K/uL Final  . Sodium 02/25/2017 135  135 - 145 mmol/L Final  . Potassium 02/25/2017 4.3  3.5 - 5.1 mmol/L Final  . Chloride 02/25/2017 105  101 - 111 mmol/L Final  . CO2 02/25/2017 23  22 - 32 mmol/L Final  . Glucose, Bld 02/25/2017 110* 65 - 99 mg/dL Final  . BUN 02/25/2017 31* 6 - 20 mg/dL Final  . Creatinine, Ser 02/25/2017 1.76* 0.61 - 1.24 mg/dL Final  . Calcium 02/25/2017 8.9  8.9 - 10.3 mg/dL Final  . Total Protein 02/25/2017 7.7  6.5 - 8.1 g/dL Final  . Albumin 02/25/2017 4.2  3.5 - 5.0 g/dL Final  . AST 02/25/2017 38  15 - 41 U/L Final  . ALT 02/25/2017 17  17 - 63 U/L Final  . Alkaline Phosphatase 02/25/2017 59  38 - 126 U/L Final  . Total Bilirubin 02/25/2017 0.4  0.3 - 1.2 mg/dL Final  . GFR calc non Af Amer 02/25/2017 38* >60 mL/min Final  . GFR calc Af Amer 02/25/2017 44* >60 mL/min Final   Comment: (NOTE) The eGFR has been calculated using the CKD EPI equation. This calculation has not been validated in all clinical situations. eGFR's persistently <60 mL/min signify possible Chronic Kidney Disease.   . Anion gap 02/25/2017 7  5 - 15 Final  . LDH 02/25/2017 625* 98 - 192 U/L Final  . Uric Acid, Serum 02/25/2017 5.0  4.4 - 7.6 mg/dL Final    Assessment:  Ruben [name] is a 71 y.o. male with polycythemia  rubra vera.  He has had polycythemia dating back to 2013.  Hematocrit was 62.1 with a hemoglobin of 19.8 on 03/28/2015.  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  Erythropoietin level was 1.1 (low).  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  He began a phlebotomy program on 03/28/2015 to maintain a hematocrit goal of < 45.  He last underwent phlebotomy on 05/02/2015.  He is on a baby aspirin.  He is followed by GI (Dr. Vira Agar) for a history of polyps and Crohn's disease.  Last colonoscopy was 3-4 years ago.  He has become iron deficient on his phlebotomy program (ferritin 21 on 11/28/2015).   CBC on 11/28/2015 revealed a hematocrit of 39.3, hemoglobin 11.9, platelets 463,000, WBC 32,000 with an ANC of 25,000.  Differential included 71% segs, 4% lymphocytes and 17% monocytes.  Peripheral smear revealed leukocytosis with predominantly mature neutrophils, increased monocytes and rare blasts (<1%).  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  Marrow was packed (95-100% cellularity) with pan myelosis, multi-lineage dyspoiesis, and no significant increase in blasts.  There was moderate to focally marked reticulin fibrosis (grade 2-3/3).  Storage iron was not identified.  Flow cytometry revealed non-specific atypical myeloid findings with no increase in blasts.  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  Cytogenetics were normal (46, XY).  B[facility] marrow on 04/07/2016 at [facility] revealed a hypercellular marrow (> 95%) with persistent involvement by myeloid neoplasm with 5% blasts.  There was mild reticulin fibrosis.  FISH t(9;22) results were normal.  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  Cytogenetics are pending.  He was briefly on hydroxyurea 500 mg a day  (12/05/2015 - 12/19/2015 and 06/25/2016 -  07/03/2016).   Platelet count increased to 1.18 million and white count to 54,800 without increased blasts on 06/25/2016.  CBC on 07/02/2016 revealed a platelets of 429,000 with a white count 30,600.  He began allopurinol on 12/19/2015.  He began Jakafi 20 mg BID on 05/28/2016.  LDH is followed:  466 on 01/02/2016, 596 on 05/23/2016, 663 on 07/30/2016, and 522 on 09/24/2016.  Triglycerides were 183 (< 150) on 08/20/2016 and 180 on 01/05/2017.  He has urinary retention.  Renal ultrasound on 07/28/2016 revealed bilateral hydronephrosis (right > left) and a large postvoid residual (2 liters) suggesting bladder dysfunction or outlet obstruction.  He underwent temporary Foley catheter placement.  He performs I/O self catheterizations.  He is on Flomax and finasteride.  PSA was 2.02 on 07/09/2016.  He has chronic renal insufficiency (Cr 1.85; CrCl 36 ml/min).  SPEP on 09/23/2016 was negative.  Spot urine revealed 17.4% of 27.3 mg/dL.of a monoclonal protein.  24 hour urine on 10/16/2016 revealed no monoclonal protein.  He is followed by nephrology and urology.  Symptomatically, he feels good.  Exam is stable.  WBC is 20,200.  Plan: 1.  Labs today: CBC with diff, CMP, LDH, uric acid. 2.  Discuss plans for future colonoscopy.  Check counts prior to procedure. 3.  Continue Jakafi.  Increase dose from 20 mg BID to 25 mg BID at next refill secondary to inadequate WBC response. 4.  Continue ferrous sulfate 325 mg po q Mondays, Wednesday, Fridays. 5.  RTC monthly for CBC with diff. 6.  RTC in 3 months for MD assessment and labs (CBC with diff, CMP, LDH, uric acid).   Lequita Asal, MD  02/25/2017 , 2:17 PM

## 2017-02-25 NOTE — Progress Notes (Signed)
Patient offers no complaints today. 

## 2017-03-09 DIAGNOSIS — D631 Anemia in chronic kidney disease: Secondary | ICD-10-CM | POA: Diagnosis not present

## 2017-03-09 DIAGNOSIS — N2581 Secondary hyperparathyroidism of renal origin: Secondary | ICD-10-CM | POA: Diagnosis not present

## 2017-03-09 DIAGNOSIS — N183 Chronic kidney disease, stage 3 (moderate): Secondary | ICD-10-CM | POA: Diagnosis not present

## 2017-03-09 DIAGNOSIS — N1330 Unspecified hydronephrosis: Secondary | ICD-10-CM | POA: Diagnosis not present

## 2017-03-14 ENCOUNTER — Other Ambulatory Visit: Payer: Self-pay | Admitting: Hematology and Oncology

## 2017-03-14 DIAGNOSIS — D7581 Myelofibrosis: Secondary | ICD-10-CM

## 2017-03-14 DIAGNOSIS — D45 Polycythemia vera: Secondary | ICD-10-CM

## 2017-03-18 ENCOUNTER — Encounter: Payer: Self-pay | Admitting: Hematology and Oncology

## 2017-03-24 ENCOUNTER — Inpatient Hospital Stay: Payer: Medicare Other | Attending: Hematology and Oncology

## 2017-03-24 DIAGNOSIS — D45 Polycythemia vera: Secondary | ICD-10-CM | POA: Diagnosis not present

## 2017-03-24 LAB — CBC WITH DIFFERENTIAL/PLATELET
Basophils Absolute: 0.3 10*3/uL — ABNORMAL HIGH (ref 0–0.1)
Basophils Relative: 1 %
Eosinophils Absolute: 0.3 10*3/uL (ref 0–0.7)
Eosinophils Relative: 1 %
HCT: 37.3 % — ABNORMAL LOW (ref 40.0–52.0)
Hemoglobin: 11.6 g/dL — ABNORMAL LOW (ref 13.0–18.0)
Lymphocytes Relative: 7 %
Lymphs Abs: 2.3 10*3/uL (ref 1.0–3.6)
MCH: 22.8 pg — ABNORMAL LOW (ref 26.0–34.0)
MCHC: 31.1 g/dL — ABNORMAL LOW (ref 32.0–36.0)
MCV: 73.3 fL — ABNORMAL LOW (ref 80.0–100.0)
Monocytes Absolute: 5.7 10*3/uL — ABNORMAL HIGH (ref 0.2–1.0)
Monocytes Relative: 17 %
Neutro Abs: 24.8 10*3/uL — ABNORMAL HIGH (ref 1.4–6.5)
Neutrophils Relative %: 74 %
Platelets: 495 10*3/uL — ABNORMAL HIGH (ref 150–440)
RBC: 5.09 MIL/uL (ref 4.40–5.90)
RDW: 21.1 % — ABNORMAL HIGH (ref 11.5–14.5)
WBC: 33.4 10*3/uL — ABNORMAL HIGH (ref 3.8–10.6)

## 2017-03-27 ENCOUNTER — Encounter
Admission: RE | Disposition: A | Discharge status: Home / Self Care | Payer: Self-pay | Source: Ambulatory Visit | Attending: Unknown Physician Specialty

## 2017-03-27 ENCOUNTER — Ambulatory Visit: Payer: Medicare Other | Admitting: Anesthesiology

## 2017-03-27 ENCOUNTER — Ambulatory Visit
Admission: RE | Admit: 2017-03-27 | Discharge: 2017-03-27 | Disposition: A | Discharge status: Home / Self Care | Payer: Medicare Other | Source: Ambulatory Visit | Attending: Unknown Physician Specialty | Admitting: Unknown Physician Specialty

## 2017-03-27 DIAGNOSIS — Z79899 Other long term (current) drug therapy: Secondary | ICD-10-CM | POA: Insufficient documentation

## 2017-03-27 DIAGNOSIS — N400 Enlarged prostate without lower urinary tract symptoms: Secondary | ICD-10-CM | POA: Insufficient documentation

## 2017-03-27 DIAGNOSIS — K5090 Crohn's disease, unspecified, without complications: Secondary | ICD-10-CM | POA: Diagnosis not present

## 2017-03-27 DIAGNOSIS — Z7982 Long term (current) use of aspirin: Secondary | ICD-10-CM | POA: Diagnosis not present

## 2017-03-27 DIAGNOSIS — Z800 Family history of malignant neoplasm of digestive organs: Secondary | ICD-10-CM | POA: Diagnosis not present

## 2017-03-27 DIAGNOSIS — Z85828 Personal history of other malignant neoplasm of skin: Secondary | ICD-10-CM | POA: Diagnosis not present

## 2017-03-27 DIAGNOSIS — M109 Gout, unspecified: Secondary | ICD-10-CM | POA: Insufficient documentation

## 2017-03-27 DIAGNOSIS — K640 First degree hemorrhoids: Secondary | ICD-10-CM | POA: Diagnosis not present

## 2017-03-27 DIAGNOSIS — Z1211 Encounter for screening for malignant neoplasm of colon: Secondary | ICD-10-CM | POA: Insufficient documentation

## 2017-03-27 DIAGNOSIS — K5010 Crohn's disease of large intestine without complications: Secondary | ICD-10-CM | POA: Diagnosis not present

## 2017-03-27 DIAGNOSIS — Z87891 Personal history of nicotine dependence: Secondary | ICD-10-CM | POA: Diagnosis not present

## 2017-03-27 DIAGNOSIS — K648 Other hemorrhoids: Secondary | ICD-10-CM | POA: Diagnosis not present

## 2017-03-27 DIAGNOSIS — H409 Unspecified glaucoma: Secondary | ICD-10-CM | POA: Insufficient documentation

## 2017-03-27 HISTORY — PX: COLONOSCOPY WITH PROPOFOL: SHX5780

## 2017-03-27 SURGERY — COLONOSCOPY WITH PROPOFOL
Anesthesia: General

## 2017-03-27 MED ORDER — PROPOFOL 500 MG/50ML IV EMUL
INTRAVENOUS | Status: AC
  Filled 2017-03-27: qty 50

## 2017-03-27 MED ORDER — PROPOFOL 500 MG/50ML IV EMUL
INTRAVENOUS | Status: DC | PRN
  Administered 2017-03-27: 120 ug/kg/min via INTRAVENOUS

## 2017-03-27 MED ORDER — SODIUM CHLORIDE 0.9 % IV SOLN
INTRAVENOUS | Status: DC
  Administered 2017-03-27: 08:00:00 via INTRAVENOUS

## 2017-03-27 MED ORDER — SODIUM CHLORIDE 0.9 % IV SOLN: INTRAVENOUS | Status: DC

## 2017-03-27 MED ORDER — PROPOFOL 10 MG/ML IV BOLUS
INTRAVENOUS | Status: DC | PRN
  Administered 2017-03-27: 90 mg via INTRAVENOUS

## 2017-03-27 NOTE — Anesthesia Postprocedure Evaluation (Signed)
Anesthesia Post Note  Patient: Ruben [name]  Procedure(s) Performed: Procedure(s) (LRB): COLONOSCOPY WITH PROPOFOL (N/A)  Patient location during evaluation: Endoscopy Anesthesia Type: General Level of consciousness: awake and alert Pain management: pain level controlled Vital Signs Assessment: post-procedure vital signs reviewed and stable Respiratory status: spontaneous breathing, nonlabored ventilation, respiratory function stable and patient connected to nasal cannula oxygen Cardiovascular status: blood pressure returned to baseline and stable Postop Assessment: no signs of nausea or vomiting Anesthetic complications: no     Last Vitals:  Vitals:   03/27/17 0928 03/27/17 0938  BP: 128/74   Pulse: 61 (!) 59  Resp: 20 12  Temp:      Last Pain:  Vitals:   03/27/17 0858  TempSrc: Tympanic                 Tarrin Menn S

## 2017-03-27 NOTE — Anesthesia Post-op Follow-up Note (Cosign Needed)
Anesthesia QCDR form completed.        

## 2017-03-27 NOTE — H&P (Signed)
Primary Care Physician:  [name], MD Primary Gastroenterologist:  Dr. Vira Agar  Pre-Procedure History & Physical: HPI:  Ruben [name] is a 71 y.o. male is here for an colonoscopy.   Past Medical History:  Diagnosis Date  . BPH (benign prostatic hyperplasia)   . Crohn disease ([place])   . Crohn's disease ([place])   . Glaucoma   . Glaucoma   . Gout   . History of chicken pox   . RBBB   . Right bundle branch block     Past Surgical History:  Procedure Laterality Date  . PROSTATE SURGERY  2008   Prostate Biopsy in [place] to elevated PSA.  per patient normal  . Skin Lesion Basal cell removed    . wart removal     from eyelid    Prior to Admission medications   Medication Sig Start Date End Date Taking? Authorizing Provider  allopurinol (ZYLOPRIM) 300 MG tablet Take 1 tablet (300 mg total) by mouth daily. 01/02/17  Yes Lequita Asal, MD  aspirin 81 MG tablet Take 81 mg by mouth daily.   Yes [provider]  JAKAFI 20 MG tablet TAKE 1 TABLET BY MOUTH TWICE DAILY. 03/14/17  Yes Corcoran, Drue Second, MD  timolol (BETIMOL) 0.25 % ophthalmic solution Place 1 drop into both eyes daily.    Yes [provider]  Cholecalciferol (VITAMIN D3) 1000 units CAPS Take 1 capsule by mouth daily.    [provider]  Cyanocobalamin (RA VITAMIN B-12 TR) 1000 MCG TBCR     [provider]  ferrous sulfate 325 (65 FE) MG tablet Take 325 mg by mouth 3 (three) times a week.    [provider]  finasteride (PROSCAR) 5 MG tablet Take 1 tablet (5 mg total) by mouth daily. 08/08/16   Hollice Espy, MD  folic acid (FOLVITE) 1 MG tablet TAKE 1 TABLET (1,000 MCG TOTAL) BY MOUTH ONCE DAILY. MUST KEEP APPT FOR FURTHER REFILLS. 03/20/15   [provider]  latanoprost (XALATAN) 0.005 % ophthalmic solution Apply 1 drop to eye daily.     [provider]  Multiple Vitamins-Minerals (CENTRUM SILVER ULTRA MENS) TABS Take by mouth.  Reported on 01/30/2016    [provider]  Omega-3 Fatty Acids (KP FISH OIL) 1200 MG CAPS Take by mouth.    [provider]  tamsulosin (FLOMAX) 0.4 MG CAPS capsule Take 1 capsule (0.4 mg total) by mouth daily. 07/28/16   Hollice Espy, MD    Allergies as of 01/08/2017  . (No Known Allergies)    Family History  Problem Relation Age of Onset  . Cancer Mother        Liver  . Cancer Father        Prostate  . Cancer Sister        Hodgkins lymphoma  . Cancer Paternal Aunt        Breast    Social History   Social History  . Marital status: Married    Spouse name: N/A  . Number of children: 2  . Years of education: N/A   Occupational History  . Retired    Social History Main Topics  . Smoking status: Former Smoker    Packs/day: 0.25    Years: 20.00    Types: Cigarettes    Quit date: 09/28/2003  . Smokeless tobacco: Never Used  . Alcohol use 0.6 oz/week    1 Glasses of wine per week  . Drug use: No  .  Sexual activity: Not on file   Other Topics Concern  . Not on file   Social History Narrative  . No narrative on file    Review of Systems: See HPI, otherwise negative ROS  Physical Exam: BP 124/64   Pulse 67   Temp 97.7 F (36.5 C) (Tympanic)   Resp 18   Ht 6' (1.829 m)   Wt 81.6 kg (180 lb)   SpO2 100%   BMI 24.41 kg/m  General:   Alert,  pleasant and cooperative in NAD Head:  Normocephalic and atraumatic. Neck:  Supple; no masses or thyromegaly. Lungs:  Clear throughout to auscultation.    Heart:  Regular rate and rhythm. Abdomen:  Soft, nontender and nondistended. Normal bowel sounds, without guarding, and without rebound.   Neurologic:  Alert and  oriented x4;  grossly normal neurologically.  Impression/Plan: Ruben [name] is here for an colonoscopy to be performed for [facility] in mother and Hx of Crohn's disease in remission.  Risks, benefits, limitations, and alternatives regarding  colonoscopy have been reviewed with the  patient.  Questions have been answered.  All parties agreeable.   Gaylyn Cheers, MD  03/27/2017, 8:31 AM

## 2017-03-27 NOTE — Anesthesia Preprocedure Evaluation (Signed)
Anesthesia Evaluation  Patient identified by MRN, date of birth, ID band Patient awake    Reviewed: Allergy & Precautions, NPO status , Patient's Chart, lab work & pertinent test results, reviewed documented beta blocker date and time   Airway Mallampati: II  TM Distance: >3 FB     Dental  (+) Chipped   Pulmonary former smoker,           Cardiovascular + dysrhythmias      Neuro/Psych    GI/Hepatic   Endo/Other    Renal/GU Renal disease     Musculoskeletal   Abdominal   Peds  Hematology   Anesthesia Other Findings Gout.  Reproductive/Obstetrics                             Anesthesia Physical Anesthesia Plan  ASA: III  Anesthesia Plan: General   Post-op Pain Management:    Induction: Intravenous  PONV Risk Score and Plan:   Airway Management Planned:   Additional Equipment:   Intra-op Plan:   Post-operative Plan:   Informed Consent: I have reviewed the patients History and Physical, chart, labs and discussed the procedure including the risks, benefits and alternatives for the proposed anesthesia with the patient or authorized representative who has indicated his/her understanding and acceptance.     Plan Discussed with: CRNA  Anesthesia Plan Comments:         Anesthesia Quick Evaluation

## 2017-03-27 NOTE — Op Note (Signed)
[facility] Gastroenterology Patient Name: Ruben [name] Procedure Date: 03/27/2017 8:33 AM MRN: [id number] Account #: [id number] Date of Birth: [birth year] Admit Type: Outpatient Age: 71 Room: [facility] ENDO ROOM 3 Gender: Male Note Status: Finalized Procedure:            Colonoscopy Indications:          Screening in patient at increased risk: Family history                        of 1st-degree relative with colorectal cancer Providers:            [name], MD Referring MD:         Kirstie Peri. Caryn Section, MD (Referring MD) Medicines:            Propofol per Anesthesia Complications:        No immediate complications. Procedure:            Pre-Anesthesia Assessment:                       - After reviewing the risks and benefits, the patient                        was deemed in satisfactory condition to undergo the                        procedure.                       After obtaining informed consent, the colonoscope was                        passed under direct vision. Throughout the procedure,                        the patient's blood pressure, pulse, and oxygen                        saturations were monitored continuously. The                        Colonoscope was introduced through the anus and                        advanced to the the cecum, identified by appendiceal                        orifice and ileocecal valve. The colonoscopy was                        performed without difficulty. The patient tolerated the                        procedure well. The quality of the bowel preparation                        was excellent. Findings:      Internal hemorrhoids were found during endoscopy. The hemorrhoids were       small and Grade I (internal hemorrhoids that do not prolapse).      The exam was otherwise without abnormality. Impression:           -  Internal hemorrhoids.                       - The examination was otherwise normal.   - No specimens collected. Recommendation:       - Repeat colonoscopy in 5 years for screening purposes. [name], MD 03/27/2017 8:57:17 AM This report has been signed electronically. Number of Addenda: 0 Note Initiated On: 03/27/2017 8:33 AM Scope Withdrawal Time: 0 hours 8 minutes 42 seconds  Total Procedure Duration: 0 hours 15 minutes 51 seconds       [facility]

## 2017-03-27 NOTE — Transfer of Care (Signed)
Immediate Anesthesia Transfer of Care Note  Patient: Ruben [name]  Procedure(s) Performed: Procedure(s): COLONOSCOPY WITH PROPOFOL (N/A)  Patient Location: PACU and Endoscopy Unit  Anesthesia Type:General  Level of Consciousness: drowsy and patient cooperative  Airway & Oxygen Therapy: Patient Spontanous Breathing and Patient connected to face mask oxygen  Post-op Assessment: Report given to RN and Post -op Vital signs reviewed and stable  Post vital signs: Reviewed and stable  Last Vitals:  Vitals:   03/27/17 0743 03/27/17 0858  BP: 124/64 (!) 97/54  Pulse: 67 67  Resp: 18 16  Temp: 36.5 C (!) 36 C    Last Pain:  Vitals:   03/27/17 0858  TempSrc: Tympanic         Complications: No apparent anesthesia complications

## 2017-03-31 ENCOUNTER — Encounter: Payer: Self-pay | Admitting: Unknown Physician Specialty

## 2017-04-13 ENCOUNTER — Other Ambulatory Visit: Payer: Self-pay

## 2017-04-22 ENCOUNTER — Other Ambulatory Visit: Payer: Self-pay | Admitting: *Deleted

## 2017-04-22 ENCOUNTER — Telehealth: Payer: Self-pay | Admitting: *Deleted

## 2017-04-22 ENCOUNTER — Inpatient Hospital Stay: Payer: Medicare Other | Attending: Hematology and Oncology

## 2017-04-22 DIAGNOSIS — D45 Polycythemia vera: Secondary | ICD-10-CM

## 2017-04-22 LAB — CBC WITH DIFFERENTIAL/PLATELET
Basophils Absolute: 0 10*3/uL (ref 0–0.1)
Basophils Relative: 0 %
Eosinophils Absolute: 0 10*3/uL (ref 0–0.7)
Eosinophils Relative: 0 %
HCT: 36.9 % — ABNORMAL LOW (ref 40.0–52.0)
Hemoglobin: 11.5 g/dL — ABNORMAL LOW (ref 13.0–18.0)
Lymphocytes Relative: 9 %
Lymphs Abs: 2.7 10*3/uL (ref 1.0–3.6)
MCH: 23 pg — ABNORMAL LOW (ref 26.0–34.0)
MCHC: 31.3 g/dL — ABNORMAL LOW (ref 32.0–36.0)
MCV: 73.4 fL — ABNORMAL LOW (ref 80.0–100.0)
Monocytes Absolute: 4.8 10*3/uL — ABNORMAL HIGH (ref 0.2–1.0)
Monocytes Relative: 16 %
Neutro Abs: 22.3 10*3/uL — ABNORMAL HIGH (ref 1.4–6.5)
Neutrophils Relative %: 75 %
Platelets: 585 10*3/uL — ABNORMAL HIGH (ref 150–440)
RBC: 5.03 MIL/uL (ref 4.40–5.90)
RDW: 20.9 % — ABNORMAL HIGH (ref 11.5–14.5)
WBC: 29.8 10*3/uL — ABNORMAL HIGH (ref 3.8–10.6)

## 2017-04-22 NOTE — Telephone Encounter (Signed)
Called patient and LVM that his platelets are up a little.  MD would like to repeat labs in one week.  Will have scheduler give him a call with time.

## 2017-04-22 NOTE — Telephone Encounter (Signed)
-----   Message from Lequita Asal, MD sent at 04/22/2017 12:15 PM EDT ----- Regarding: Please call patient  Platelets up a little.  Let's check in a week as last time it resolved on its own.  M  ----- Message ----- From: Interface, Lab In [place] Sent: 04/22/2017   9:41 AM To: Lequita Asal, MD

## 2017-04-24 ENCOUNTER — Encounter: Payer: Self-pay | Admitting: Hematology and Oncology

## 2017-04-26 ENCOUNTER — Other Ambulatory Visit: Payer: Self-pay | Admitting: Hematology and Oncology

## 2017-04-27 DIAGNOSIS — D225 Melanocytic nevi of trunk: Secondary | ICD-10-CM | POA: Diagnosis not present

## 2017-04-27 DIAGNOSIS — X32XXXA Exposure to sunlight, initial encounter: Secondary | ICD-10-CM | POA: Diagnosis not present

## 2017-04-27 DIAGNOSIS — Z85828 Personal history of other malignant neoplasm of skin: Secondary | ICD-10-CM | POA: Diagnosis not present

## 2017-04-27 DIAGNOSIS — D2261 Melanocytic nevi of right upper limb, including shoulder: Secondary | ICD-10-CM | POA: Diagnosis not present

## 2017-04-27 DIAGNOSIS — L570 Actinic keratosis: Secondary | ICD-10-CM | POA: Diagnosis not present

## 2017-04-29 ENCOUNTER — Inpatient Hospital Stay: Payer: Medicare Other

## 2017-04-29 ENCOUNTER — Telehealth: Payer: Self-pay | Admitting: *Deleted

## 2017-04-29 DIAGNOSIS — D45 Polycythemia vera: Secondary | ICD-10-CM | POA: Diagnosis not present

## 2017-04-29 LAB — CBC WITH DIFFERENTIAL/PLATELET
Basophils Absolute: 0.3 10*3/uL — ABNORMAL HIGH (ref 0–0.1)
Basophils Relative: 1 %
Eosinophils Absolute: 0 10*3/uL (ref 0–0.7)
Eosinophils Relative: 0 %
HCT: 37.2 % — ABNORMAL LOW (ref 40.0–52.0)
Hemoglobin: 11.7 g/dL — ABNORMAL LOW (ref 13.0–18.0)
Lymphocytes Relative: 8 %
Lymphs Abs: 2.4 10*3/uL (ref 1.0–3.6)
MCH: 23.3 pg — ABNORMAL LOW (ref 26.0–34.0)
MCHC: 31.6 g/dL — ABNORMAL LOW (ref 32.0–36.0)
MCV: 73.7 fL — ABNORMAL LOW (ref 80.0–100.0)
Monocytes Absolute: 4.9 10*3/uL — ABNORMAL HIGH (ref 0.2–1.0)
Monocytes Relative: 16 %
Neutro Abs: 22.9 10*3/uL — ABNORMAL HIGH (ref 1.4–6.5)
Neutrophils Relative %: 75 %
Platelets: 499 10*3/uL — ABNORMAL HIGH (ref 150–440)
RBC: 5.05 MIL/uL (ref 4.40–5.90)
RDW: 21.3 % — ABNORMAL HIGH (ref 11.5–14.5)
WBC: 30.5 10*3/uL — ABNORMAL HIGH (ref 3.8–10.6)

## 2017-04-29 NOTE — Telephone Encounter (Signed)
-----   Message from Lequita Asal, MD sent at 04/29/2017 11:45 AM EDT ----- Regarding: Please call patient  Platelet count improving.  We are increasing Jakafi dose.  M  ----- Message ----- From: Interface, Lab In [place] Sent: 04/29/2017  10:43 AM To: Lequita Asal, MD

## 2017-04-29 NOTE — Telephone Encounter (Signed)
Called patient to inform him that his platelet count is improving.  MD will increase Jakafi dosage.

## 2017-05-03 ENCOUNTER — Encounter: Payer: Self-pay | Admitting: Hematology and Oncology

## 2017-05-05 NOTE — Telephone Encounter (Signed)
Called pt and spoke to  Him. I told him I was on the phone withPAN the copay asst fund and they were going to get information to decide about if he could re- qualify for asst..  The patient gave me his 2017 irs amount -61, 312.00,  He stil lhad 2 people living in his house and he was qualified for $8,500 for copay asst for [place].  I told the pt's wife that is was approved and it starts 05/22/2017 and good until 05/21/2018.  Need to allow 7-10 business days to get letter in mail

## 2017-05-11 ENCOUNTER — Telehealth: Payer: Self-pay | Admitting: Hematology and Oncology

## 2017-05-11 NOTE — Telephone Encounter (Signed)
Oral Oncology Patient Advocate Encounter  We received a fax that the patient was approved for $ 8500.00 grant from Patient [place] [facility]) to provide copayment coverage for his Jakafi.  This will keep the out of pocket expense at $0.    I have spoken with the patient.    The billing information is as follows and has been shared with [place].   Member ID: [id number] Group ID: [id number] Dates of Eligibility: 05/22/2017 through 05/21/2018   [place] Patient Advocate 05/11/2017 10:25 AM

## 2017-05-14 DIAGNOSIS — H401131 Primary open-angle glaucoma, bilateral, mild stage: Secondary | ICD-10-CM | POA: Diagnosis not present

## 2017-05-20 ENCOUNTER — Telehealth: Payer: Self-pay

## 2017-05-20 NOTE — Telephone Encounter (Signed)
Pt requires a coude catheter due to inability to pass a straight tip catheter.

## 2017-05-21 ENCOUNTER — Telehealth: Payer: Self-pay | Admitting: Pharmacist

## 2017-05-21 DIAGNOSIS — D7581 Myelofibrosis: Secondary | ICD-10-CM

## 2017-05-21 DIAGNOSIS — H401131 Primary open-angle glaucoma, bilateral, mild stage: Secondary | ICD-10-CM | POA: Diagnosis not present

## 2017-05-21 DIAGNOSIS — D45 Polycythemia vera: Secondary | ICD-10-CM

## 2017-05-21 MED ORDER — RUXOLITINIB PHOSPHATE 25 MG PO TABS: 25.0000 mg | ORAL_TABLET | Freq: Two times a day (BID) | ORAL | 5 refills | Status: DC

## 2017-05-21 MED FILL — JAKAFI 25 MG TABLET: 25 | 30 days supply | Qty: 60 | Fill #0

## 2017-05-21 NOTE — Telephone Encounter (Signed)
Oral Oncology Pharmacist Encounter  Received request from MD to send new prescription dose increase for [facility] for the treatment of polycythemia vera in, planned duration until disease progression or unacceptable drug toxicity. Patient original started medication on 91/17  CBC from 04/29/17 assessed, no lab abnormalities to prevent increase of medication.  Current medication list in Epic reviewed, no DDIs with Jakafi identified.  Prescription has been e-scribed to the [facility].   Darl Pikes, PharmD, BCPS Hematology/Oncology Clinical Pharmacist [facility] [phone]  05/21/2017 10:03 AM

## 2017-05-21 NOTE — Telephone Encounter (Signed)
Oral Chemotherapy Pharmacist Encounter  I spoke with patient for overview of refill prescription for Jakafifor the treatment of polycythemia vera in, planned duration until disease progression or unacceptable drug toxicity. Patient original started medication on 91/17. Provider would like for that patient to increase from 29m bid to 215m   [facility]. Reviewed with patient administration, dosing, side effects, safe handling, and monitoring. Patient will take 1 tablet (25 mg total) by mouth 2 (two) times daily.   Patient will switch to 25 mg when he receives new medication. His next appt in clinic is scheduled for 06/10/17  Side effects include but not limited to: bruising, dizziness, headache. Patient does not endorse any current side effects.   Reviewed with patient importance of keeping a medication schedule and plan for any missed doses.  Mr. RoHarrisonoiced understanding and appreciation. All questions answered. Patient knows to call the office with questions or concerns. Oral Oncology Clinic will continue to follow.  Thank you,  AlDarl PikesPharmD, BCPS Hematology/Oncology Clinical Pharmacist [phone]/30/2018 11:46 AM

## 2017-05-21 NOTE — Telephone Encounter (Signed)
Oral Oncology Patient Advocate Encounter  Called PANF to get information to fill Jakafi with our pharmancy to cover his co-payment  of $608.68. His PANF was approved for 05/22/2017-05/21/2018 for $8500.00.  We filed today thru his PANF that ends today, he still has $5000.00 left from this year. We will mail his Shanon Brow to him when in comes in. We had to order.     [place] Patient Advocate [phone] 05/21/2017 12:46 PM

## 2017-05-21 NOTE — Telephone Encounter (Signed)
Oral Chemotherapy Pharmacist Encounter   Attempted to reach patient for follow up on oral medication: Jakafi. Dr. Mike Gip would like the patient to increase his medication dose to 26m twice a day. Called patient to see how much of 286mcurrent supply he has left. No answer. Left VM for patient to call back with any questions or issues.   Thank you,  AlDarl PikesPharmD, BCPS Hematology/Oncology Clinical Pharmacist [phone]/30/2018 9:52 AM

## 2017-05-26 ENCOUNTER — Telehealth: Payer: Self-pay | Admitting: Pharmacist

## 2017-05-26 NOTE — Telephone Encounter (Signed)
Oral Chemotherapy Pharmacist Encounter  Received call from patient's wife letting me know that they were going out of time Friday and he had about 7 days left of medication. I let her know that his Jakafi 59m tablets should arrive today via UPS.  [facility] also [facility] [place] [facility]. We previously let [facility] know that the patient was no longer on Jakafi 283m I called again today to make sure they knew the patient was no longer filling with them.   Provide wife with our clinic number. She knows to call the office with questions or concerns. Oral Oncology Clinic will continue to follow patient.  Thank you,  AlDarl PikesPharmD, BCPS Hematology/Oncology Clinical Pharmacist [phone]/12/2016 10:55 AM

## 2017-05-27 ENCOUNTER — Other Ambulatory Visit: Payer: Medicare Other

## 2017-05-27 ENCOUNTER — Ambulatory Visit: Payer: Medicare Other | Admitting: Hematology and Oncology

## 2017-05-29 ENCOUNTER — Other Ambulatory Visit: Payer: Self-pay

## 2017-05-29 ENCOUNTER — Ambulatory Visit (INDEPENDENT_AMBULATORY_CARE_PROVIDER_SITE_OTHER): Payer: Medicare Other

## 2017-05-29 VITALS — BP 123/73 | HR 81 | Ht 72.0 in | Wt 182.2 lb

## 2017-05-29 DIAGNOSIS — N401 Enlarged prostate with lower urinary tract symptoms: Secondary | ICD-10-CM

## 2017-05-29 DIAGNOSIS — N400 Enlarged prostate without lower urinary tract symptoms: Secondary | ICD-10-CM

## 2017-05-29 LAB — MICROSCOPIC EXAMINATION
EPITHELIAL CELLS (NON RENAL): NONE SEEN /HPF (ref 0–10)
RBC, UA: >30 /hpf — ABNORMAL HIGH (ref 0–?)
WBC, UA: >30 /hpf — ABNORMAL HIGH (ref 0–?)

## 2017-05-29 LAB — URINALYSIS, COMPLETE
Bilirubin, UA: NEGATIVE
GLUCOSE, UA: NEGATIVE
KETONES UA: NEGATIVE
Nitrite, UA: POSITIVE — AB
SPEC GRAV UA: 1.025 (ref 1.005–1.030)
Urobilinogen, Ur: 0.2 mg/dL (ref 0.2–1.0)
pH, UA: 6 (ref 5.0–7.5)

## 2017-05-29 MED ORDER — TAMSULOSIN HCL 0.4 MG PO CAPS: 0.4000 mg | ORAL_CAPSULE | Freq: Every day | ORAL | 3 refills | Status: DC

## 2017-05-29 NOTE — Progress Notes (Signed)
Pt presents today with c/o gross hematuria and slight dysuria. Pt denied n/v, f/c. Pt performed CIC to provide a urine sample. Sample was sent for u/a and cx.  Blood pressure 123/73, pulse 81, height 6' (1.829 m), weight 182 lb 3.2 oz (82.6 kg).  Pt stated [place] beach [place], therefore call cell phone number and if any abx are needed will need to be called in down [place].

## 2017-06-02 ENCOUNTER — Telehealth: Payer: Self-pay

## 2017-06-02 MED ORDER — SULFAMETHOXAZOLE-TRIMETHOPRIM 800-160 MG PO TABS: 1.0000 | ORAL_TABLET | Freq: Two times a day (BID) | ORAL | 0 refills | Status: DC

## 2017-06-02 NOTE — Telephone Encounter (Deleted)
-----   Message from Hollice Espy, MD sent at 05/29/2017  4:21 PM EDT ----- Yes treat!  Bactrim DS bid x 7  Hollice Espy, MD  ----- Message ----- From: Lestine Box, LPN Sent: 0/12/4713   2:53 PM To: Hollice Espy, MD  Its lit up. Nitrite positive, >30 wbc, >30 rbc, many bacteria.  ----- Message ----- From: Hollice Espy, MD Sent: 05/29/2017   2:20 PM To: Lestine Box, LPN  What does the urine look like?   ----- Message ----- From: Lestine Box, LPN Sent: 8/0/6386   1:35 PM To: Hollice Espy, MD  I meant to show you this pt's u/a before you left. His only symptom is blood and he does CIC. Do you think he still needs an abx prior to ucx results?

## 2017-06-02 NOTE — Telephone Encounter (Signed)
Per Dr. Erlene Quan patient was notified that UA was suspicious for infection and would like for him to start abx, this was sent to his pharm. Will call with culture results

## 2017-06-03 ENCOUNTER — Other Ambulatory Visit: Payer: Medicare Other

## 2017-06-03 ENCOUNTER — Ambulatory Visit: Payer: Medicare Other | Admitting: Hematology and Oncology

## 2017-06-04 LAB — CULTURE, URINE COMPREHENSIVE

## 2017-06-05 ENCOUNTER — Telehealth: Payer: Self-pay

## 2017-06-05 NOTE — Telephone Encounter (Signed)
LMOM- on appropriate abx.

## 2017-06-05 NOTE — Telephone Encounter (Signed)
-----   Message from Hollice Espy, MD sent at 06/05/2017  8:38 AM EDT ----- +UCx, should already by on Bactrim which is appropriate.

## 2017-06-09 NOTE — Progress Notes (Signed)
[place] [facility] day:  06/10/2017  Chief Complaint: Ruben [name] is a 71 y.o. 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  HPI: The patient was last seen in the medical oncology clinic on 02/25/2017.  At that time, he felt good.  Exam was stable. WBC was 20,200 with an [place] of 14,400. Hemoglobin was 11.4, medical 35.9, MCV 72.3, platelets 382,000. Creatinine was 1.76. Electrolytes and liver function tests were normal. LDH was 625.  Uric acid normal. Jakafi was increased to 25 mg twice a day.  Patient was to continue ferrous sulfate 325 mg 3 times a week.  CBCs were monitored monthly since his last visit. CBC done on 04/29/2017 revealed a white count of 30,500 with an Smyth of 22,900. Hemoglobin was 11.7, hematocrit 37.2, MCV 73.7, and platelet count 499,000.  Colonoscopy on 03/27/2017 by Dr. Vira Agar revealed small grade I internal hemorrhoids.    He was treated for UTI on 05/29/2017 by Dr. Erlene Quan following a positive urine culture which showed greater than 100,000 CFU/mL Enterobacter aerogenes.  He was subsequently treated with Bactrim BID x 7 days; last dose was on 06/09/2017.  During the interim, he has done well. He denies any complaints. He denies urinary symptoms; no gross hematuria.  He started the increased dose of Jakafi 63m x 2 weeks ago.    Past Medical History:  Diagnosis Date  . BPH (benign prostatic hyperplasia)   . [place]   . [place]   . Glaucoma   . Glaucoma   . Gout   . History of chicken pox   . RBBB   . Right bundle branch block     Past Surgical History:  Procedure Laterality Date  . COLONOSCOPY WITH PROPOFOL N/A 03/27/2017   Procedure: COLONOSCOPY WITH PROPOFOL;  Surgeon: EManya Silvas MD;  [facility];  Service: Endoscopy;  Laterality: N/A;  . PROSTATE SURGERY  2008   Prostate [place] elevated PSA.  per patient normal  .  Skin Lesion Basal cell removed    . wart removal     from eyelid    Family History  Problem Relation Age of Onset  . Cancer Mother        Liver  . Cancer Father        Prostate  . Cancer Sister        Hodgkins lymphoma  . Cancer Paternal Aunt        Breast    Social History:  reports that he quit smoking about 13 years ago. His smoking use included Cigarettes. He has a 5.00 pack-year smoking history. He has never used smokeless tobacco. He reports that he drinks about 0.6 oz of alcohol per week . He reports that he does not use drugs.  He previously worked for [employer]  He volunteers for [employer].  [place]  The patient is accompanied by his wife, [name] today.  Allergies: No Known Allergies  Current Medications: Current Outpatient Prescriptions  Medication Sig Dispense Refill  . allopurinol (ZYLOPRIM) 300 MG tablet TAKE 1 TABLET (300 MG TOTAL) BY MOUTH DAILY. 90 tablet 0  . aspirin 81 MG tablet Take 81 mg by mouth daily.    . Cholecalciferol (VITAMIN D3) 1000 units CAPS Take 1 capsule by mouth daily.    . Cyanocobalamin (RA VITAMIN B-12 TR) 1000 MCG TBCR     .  ferrous sulfate 325 (65 FE) MG tablet Take 325 mg by mouth 3 (three) times a week.    . finasteride (PROSCAR) 5 MG tablet Take 1 tablet (5 mg total) by mouth daily. 90 tablet 3  . folic acid (FOLVITE) 1 MG tablet TAKE 1 TABLET (1,000 MCG TOTAL) BY MOUTH ONCE DAILY. MUST KEEP APPT FOR FURTHER REFILLS.    Marland Kitchen latanoprost (XALATAN) 0.005 % ophthalmic solution Apply 1 drop to eye daily.     . Multiple Vitamins-Minerals (CENTRUM SILVER ULTRA MENS) TABS Take by mouth. Reported on 01/30/2016    . Omega-3 Fatty Acids (KP FISH OIL) 1200 MG CAPS Take by mouth.    . ruxolitinib phosphate (JAKAFI) 25 MG tablet Take 1 tablet (25 mg total) by mouth 2 (two) times daily. 60 tablet 5  . tamsulosin (FLOMAX) 0.4 MG CAPS capsule Take 1 capsule (0.4 mg total) by mouth daily. 30  capsule 3  . timolol (BETIMOL) 0.25 % ophthalmic solution Place 1 drop into both eyes daily.     . sodium bicarbonate 650 MG tablet Take 650 mg by mouth 2 (two) times daily.     No current facility-administered medications for this visit.     Review of Systems:  GENERAL:  Feels "good".  No fevers or sweats.  Weight down 1 pound.  PERFORMANCE STATUS (ECOG):  1 HEENT:  No visual changes, runny nose, sore throat, mouth sores or tenderness. Lungs: No shortness of breath or cough.  No hemoptysis. Cardiac:  No chest pain, palpitations, orthopnea, or PND. GI:  Diarrhea (chronic).  No nausea, vomiting, constipation, melena or hematochezia. GU:  Self catheterization 4x/day.  Typical volume 300-400 cc (800 cc in AM).  No urgency, dysuria, or hematuria.  Interval UTI. Musculoskeletal:  No back pain. No muscle tenderness. Extremities:  No bone pain.  No joint pain or swelling. Skin:  No rashes or skin changes. Neuro:  No headache, numbness or weakness, balance or coordination issues. Endocrine:  No diabetes, thyroid issues, hot flashes or night sweats. Psych:  No mood changes, depression or anxiety. Pain:  No pain. Review of systems:  All other systems reviewed and found to be negative.   Physical Exam: Blood pressure 104/66, pulse 66, temperature (!) 97.2 F (36.2 C), temperature source Tympanic, resp. rate 18, weight 181 lb (82.1 kg). GENERAL:  Well developed, well nourished, gentleman sitting comfortably in the exam room in no acute distress. MENTAL STATUS:  Alert and oriented to person, place and time. HEAD:  Pearline Cables hair.  Normocephalic, atraumatic, face symmetric, no Cushingoid features. EYES: Glasses. Blue eyes. Pupils equal round and reactive to light and accomodation.  No conjunctivitis or scleral icterus. ENT: Oropharynx clear without lesion. Tongue normal. Mucous membranes moist.  RESPIRATORY: Clear to auscultation without rales, wheezes or rhonchi. CARDIOVASCULAR: Regular rate  and rhythm without murmur, rub or gallop. ABDOMEN: Soft, non-tender, with active bowel sounds, and no appreciable hepatosplenomegaly.  No masses.  SKIN: No rashes, ulcers or lesions. EXTREMITIES: No edema, no skin discoloration or tenderness. No palpable cords. LYMPH NODES: No palpable cervical, supraclavicular, axillary or inguinal adenopathy  NEUROLOGICAL: Unremarkable. PSYCH: Appropriate.    Appointment on 06/10/2017  Component Date Value Ref Range Status  . WBC 06/10/2017 43.0* 3.8 - 10.6 K/uL Final  . RBC 06/10/2017 4.86  4.40 - 5.90 MIL/uL Final  . Hemoglobin 06/10/2017 11.3* 13.0 - 18.0 g/dL Final  . HCT 06/10/2017 35.7* 40.0 - 52.0 % Final  . MCV 06/10/2017 73.3* 80.0 - 100.0 fL Final  .  MCH 06/10/2017 23.2* 26.0 - 34.0 pg Final  . MCHC 06/10/2017 31.7* 32.0 - 36.0 g/dL Final  . RDW 06/10/2017 20.9* 11.5 - 14.5 % Final  . Platelets 06/10/2017 550* 150 - 440 K/uL Final  . Neutrophils Relative % 06/10/2017 PENDING  % Incomplete  . Neutro Abs 06/10/2017 PENDING  1.7 - 7.7 K/uL Incomplete  . Band Neutrophils 06/10/2017 PENDING  % Incomplete  . Lymphocytes Relative 06/10/2017 PENDING  % Incomplete  . Lymphs Abs 06/10/2017 PENDING  0.7 - 4.0 K/uL Incomplete  . Monocytes Relative 06/10/2017 PENDING  % Incomplete  . Monocytes Absolute 06/10/2017 PENDING  0.1 - 1.0 K/uL Incomplete  . Eosinophils Relative 06/10/2017 PENDING  % Incomplete  . Eosinophils Absolute 06/10/2017 PENDING  0.0 - 0.7 K/uL Incomplete  . Basophils Relative 06/10/2017 PENDING  % Incomplete  . Basophils Absolute 06/10/2017 PENDING  0.0 - 0.1 K/uL Incomplete  . WBC Morphology 06/10/2017 PENDING   Incomplete  . RBC Morphology 06/10/2017 PENDING   Incomplete  . Smear Review 06/10/2017 PENDING   Incomplete  . Other 06/10/2017 PENDING  % Incomplete  . nRBC 06/10/2017 PENDING  0 /100 WBC Incomplete  . Metamyelocytes Relative 06/10/2017 PENDING  % Incomplete  . Myelocytes 06/10/2017 PENDING  % Incomplete  .  Promyelocytes Absolute 06/10/2017 PENDING  % Incomplete  . Blasts 06/10/2017 PENDING  % Incomplete  . Sodium 06/10/2017 137  135 - 145 mmol/L Final  . Potassium 06/10/2017 4.9  3.5 - 5.1 mmol/L Final  . Chloride 06/10/2017 107  101 - 111 mmol/L Final  . CO2 06/10/2017 21* 22 - 32 mmol/L Final  . Glucose, Bld 06/10/2017 88  65 - 99 mg/dL Final  . BUN 06/10/2017 47* 6 - 20 mg/dL Final  . Creatinine, Ser 06/10/2017 2.52* 0.61 - 1.24 mg/dL Final  . Calcium 06/10/2017 8.8* 8.9 - 10.3 mg/dL Final  . Total Protein 06/10/2017 8.0  6.5 - 8.1 g/dL Final  . Albumin 06/10/2017 4.5  3.5 - 5.0 g/dL Final  . AST 06/10/2017 54* 15 - 41 U/L Final  . ALT 06/10/2017 21  17 - 63 U/L Final  . Alkaline Phosphatase 06/10/2017 62  38 - 126 U/L Final  . Total Bilirubin 06/10/2017 0.4  0.3 - 1.2 mg/dL Final  . GFR calc non Af Amer 06/10/2017 24* >60 mL/min Final  . GFR calc Af Amer 06/10/2017 28* >60 mL/min Final   Comment: (NOTE) The eGFR has been calculated using the CKD EPI equation. This calculation has not been validated in all clinical situations. eGFR's persistently <60 mL/min signify possible Chronic Kidney Disease.   . Anion gap 06/10/2017 9  5 - 15 Final  . LDH 06/10/2017 1144* 98 - 192 U/L Final  . Uric Acid, Serum 06/10/2017 5.4  4.4 - 7.6 mg/dL Final    Assessment:  Cassiel Fernandez is a 71 y.o. 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  He has had polycythemia dating back to 2013.  Hematocrit was 62.1 with a hemoglobin of 19.8 on 03/28/2015.  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  Erythropoietin level was 1.1 (low).  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  He began a phlebotomy program on 03/28/2015 to maintain a hematocrit goal of < 45.  He last underwent phlebotomy on 05/02/2015.  He is on a baby aspirin.  He is followed by GI (Dr. Vira Agar) for a history of polyps and Crohn's disease.  Last colonoscopy was 3-4 years ago.  He has become iron deficient on his phlebotomy  program  (ferritin 21 on 11/28/2015).   CBC on 11/28/2015 revealed a hematocrit of 39.3, hemoglobin 11.9, platelets 463,000, WBC 32,000 with an ANC of 25,000.  Differential included 71% segs, 4% lymphocytes and 17% monocytes.  Peripheral smear revealed leukocytosis with predominantly mature neutrophils, increased monocytes and rare blasts (<1%).  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  Marrow was packed (95-100% cellularity) with pan myelosis, multi-lineage dyspoiesis, and no significant increase in blasts.  There was moderate to focally marked reticulin fibrosis (grade 2-3/3).  Storage iron was not identified.  Flow cytometry revealed non-specific atypical myeloid findings with no increase in blasts.  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  Cytogenetics were normal (46, XY).  B[facility] on 04/07/2016 [facility] revealed a hypercellular marrow (> 95%) with persistent involvement by myeloid neoplasm with 5% blasts.  There was mild reticulin fibrosis.  FISH t(9;22) results were normal.  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  Cytogenetics are pending.  He was briefly on hydroxyurea 500 mg a day (12/05/2015 - 12/19/2015 and 06/25/2016 - 07/03/2016).   Platelet count increased to 1.18 million and white count to 54,800 without increased blasts on 06/25/2016.  CBC on 07/02/2016 revealed a platelets of 429,000 with a white count 30,600.  He began allopurinol on 12/19/2015.  He began Jakafi 20 mg BID on 05/28/2016.  LDH is followed:  466 on 01/02/2016, 596 on 05/23/2016, 663 on 07/30/2016, 522 on 09/24/2016, 638 on 01/05/2017, 625 on 02/25/2017, and 1144 on 06/10/2017.  Triglycerides were 183 (< 150) on 08/20/2016 and 180 on 01/05/2017.  He has urinary retention.  Renal  ultrasound on 07/28/2016 revealed bilateral hydronephrosis (right > left) and a large postvoid residual (2 liters) suggesting bladder dysfunction or outlet obstruction.  He underwent temporary Foley catheter placement.  He performs I/O self catheterizations.  He is on Flomax and finasteride.  PSA was 2.02 on 07/09/2016.  He has chronic renal insufficiency (Cr 1.85; CrCl 36 ml/min).  SPEP on 09/23/2016 was negative.  Spot urine revealed 17.4% of 27.3 mg/dL.of a monoclonal protein.  24 hour urine on 10/16/2016 revealed no monoclonal protein.  He is followed by nephrology and urology.  Symptomatically, he feels good.  Exam is stable.  WBC is 43,000. Hemoglobin is 11.3, hematocrit 35.7, MCV 73.3, and platelets 550,000. LDH is 1144.  Creatinine has increased from 1.76 to 2.52 (CrCl 29 ml/min) likely secondary to Bactrim.  Plan: 1.  Labs today: CBC with diff, CMP, LDH, uric acid. 2.  Discuss worsening kidney function. Patient followed by Dr. Holley Raring.  Creatine elevated to 2.52.  Etiology likely secondary to Bactrim.  Discuss importance of fluids.  Phone follow-up with Dr. Holley Raring- done. 3.  Continue Jakafi 25 mg BID. 4.  Continue ferrous sulfate 325 mg on Mondays, Wednesday, Fridays. 5.  RTC in 1 week for labs  (CBC with diff, CMP, LDH, triglycerides). 6.  RTC in 1 month for MD assessment and labs (CBC with diff, CMP, LDH, uric acid).   Honor Loh, NP  06/10/2017 , 9:46 AM   I saw and evaluated the patient, participating in the key portions of the service and reviewing pertinent diagnostic studies and records.  I reviewed the nurse practitioner's note and agree with the findings and the plan.  The assessment and plan were discussed with the patient.  Multiple questions were asked by  the patient and answered.   Lequita Asal, MD 06/10/2017,2:19 PM

## 2017-06-10 ENCOUNTER — Inpatient Hospital Stay: Payer: Medicare Other | Attending: Hematology and Oncology | Admitting: Hematology and Oncology

## 2017-06-10 ENCOUNTER — Other Ambulatory Visit: Payer: Self-pay | Admitting: *Deleted

## 2017-06-10 ENCOUNTER — Encounter: Payer: Self-pay | Admitting: Hematology and Oncology

## 2017-06-10 ENCOUNTER — Inpatient Hospital Stay: Payer: Medicare Other

## 2017-06-10 VITALS — BP 104/66 | HR 66 | Temp 97.2°F | Resp 18 | Wt 181.0 lb

## 2017-06-10 DIAGNOSIS — D45 Polycythemia vera: Secondary | ICD-10-CM

## 2017-06-10 DIAGNOSIS — Z803 Family history of malignant neoplasm of breast: Secondary | ICD-10-CM

## 2017-06-10 DIAGNOSIS — Z8619 Personal history of other infectious and parasitic diseases: Secondary | ICD-10-CM | POA: Insufficient documentation

## 2017-06-10 DIAGNOSIS — Z8042 Family history of malignant neoplasm of prostate: Secondary | ICD-10-CM | POA: Diagnosis not present

## 2017-06-10 DIAGNOSIS — Z148 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: Secondary | ICD-10-CM | POA: Insufficient documentation

## 2017-06-10 DIAGNOSIS — N400 Enlarged prostate without lower urinary tract symptoms: Secondary | ICD-10-CM

## 2017-06-10 DIAGNOSIS — Z87891 Personal history of nicotine dependence: Secondary | ICD-10-CM | POA: Insufficient documentation

## 2017-06-10 DIAGNOSIS — I4510 Unspecified right bundle-branch block: Secondary | ICD-10-CM | POA: Insufficient documentation

## 2017-06-10 DIAGNOSIS — Z807 Family history of other malignant neoplasms of lymphoid, hematopoietic and related tissues: Secondary | ICD-10-CM | POA: Diagnosis not present

## 2017-06-10 DIAGNOSIS — Z800 Family history of malignant neoplasm of digestive organs: Secondary | ICD-10-CM | POA: Insufficient documentation

## 2017-06-10 DIAGNOSIS — H409 Unspecified glaucoma: Secondary | ICD-10-CM | POA: Diagnosis not present

## 2017-06-10 DIAGNOSIS — N189 Chronic kidney disease, unspecified: Secondary | ICD-10-CM

## 2017-06-10 DIAGNOSIS — K5090 Crohn's disease, unspecified, without complications: Secondary | ICD-10-CM | POA: Diagnosis not present

## 2017-06-10 DIAGNOSIS — M109 Gout, unspecified: Secondary | ICD-10-CM | POA: Insufficient documentation

## 2017-06-10 DIAGNOSIS — Z79899 Other long term (current) drug therapy: Secondary | ICD-10-CM | POA: Diagnosis not present

## 2017-06-10 DIAGNOSIS — K640 First degree hemorrhoids: Secondary | ICD-10-CM | POA: Insufficient documentation

## 2017-06-10 DIAGNOSIS — Z7982 Long term (current) use of aspirin: Secondary | ICD-10-CM | POA: Diagnosis not present

## 2017-06-10 DIAGNOSIS — D7581 Myelofibrosis: Secondary | ICD-10-CM

## 2017-06-10 DIAGNOSIS — Z87440 Personal history of urinary (tract) infections: Secondary | ICD-10-CM | POA: Diagnosis not present

## 2017-06-10 DIAGNOSIS — I129 Hypertensive chronic kidney disease with stage 1 through stage 4 chronic kidney disease, or unspecified chronic kidney disease: Secondary | ICD-10-CM | POA: Diagnosis not present

## 2017-06-10 DIAGNOSIS — N289 Disorder of kidney and ureter, unspecified: Secondary | ICD-10-CM

## 2017-06-10 LAB — COMPREHENSIVE METABOLIC PANEL
ALT: 21 U/L (ref 17–63)
AST: 54 U/L — ABNORMAL HIGH (ref 15–41)
Albumin: 4.5 g/dL (ref 3.5–5.0)
Alkaline Phosphatase: 62 U/L (ref 38–126)
Anion gap: 9 (ref 5–15)
BUN: 47 mg/dL — ABNORMAL HIGH (ref 6–20)
CO2: 21 mmol/L — ABNORMAL LOW (ref 22–32)
Calcium: 8.8 mg/dL — ABNORMAL LOW (ref 8.9–10.3)
Chloride: 107 mmol/L (ref 101–111)
Creatinine, Ser: 2.52 mg/dL — ABNORMAL HIGH (ref 0.61–1.24)
GFR calc Af Amer: 28 mL/min — ABNORMAL LOW (ref >60)
GFR calc non Af Amer: 24 mL/min — ABNORMAL LOW (ref >60)
Glucose, Bld: 88 mg/dL (ref 65–99)
Potassium: 4.9 mmol/L (ref 3.5–5.1)
Sodium: 137 mmol/L (ref 135–145)
Total Bilirubin: 0.4 mg/dL (ref 0.3–1.2)
Total Protein: 8 g/dL (ref 6.5–8.1)

## 2017-06-10 LAB — CBC WITH DIFFERENTIAL/PLATELET
Basophils Absolute: 0.4 10*3/uL — ABNORMAL HIGH (ref 0–0.1)
Basophils Relative: 1 %
Eosinophils Absolute: 0 10*3/uL (ref 0–0.7)
Eosinophils Relative: 0 %
HCT: 35.7 % — ABNORMAL LOW (ref 40.0–52.0)
Hemoglobin: 11.3 g/dL — ABNORMAL LOW (ref 13.0–18.0)
Lymphocytes Relative: 6 %
Lymphs Abs: 2.6 10*3/uL (ref 1.0–3.6)
MCH: 23.2 pg — ABNORMAL LOW (ref 26.0–34.0)
MCHC: 31.7 g/dL — ABNORMAL LOW (ref 32.0–36.0)
MCV: 73.3 fL — ABNORMAL LOW (ref 80.0–100.0)
Monocytes Absolute: 3.9 10*3/uL — ABNORMAL HIGH (ref 0.2–1.0)
Monocytes Relative: 9 %
Neutro Abs: 36.1 10*3/uL — ABNORMAL HIGH (ref 1.4–6.5)
Neutrophils Relative %: 84 %
Platelets: 550 10*3/uL — ABNORMAL HIGH (ref 150–440)
RBC: 4.86 MIL/uL (ref 4.40–5.90)
RDW: 20.9 % — ABNORMAL HIGH (ref 11.5–14.5)
WBC: 43 10*3/uL — ABNORMAL HIGH (ref 3.8–10.6)

## 2017-06-10 LAB — LACTATE DEHYDROGENASE: LDH: 1144 U/L — ABNORMAL HIGH (ref 98–192)

## 2017-06-10 LAB — URIC ACID: Uric Acid, Serum: 5.4 mg/dL (ref 4.4–7.6)

## 2017-06-10 NOTE — Progress Notes (Signed)
Patient states he has just finished a round of bactrim for a UTI.  Offers no complaints today.

## 2017-06-17 ENCOUNTER — Telehealth: Payer: Self-pay | Admitting: *Deleted

## 2017-06-17 ENCOUNTER — Other Ambulatory Visit: Payer: Self-pay

## 2017-06-17 ENCOUNTER — Other Ambulatory Visit: Payer: Self-pay | Admitting: *Deleted

## 2017-06-17 ENCOUNTER — Inpatient Hospital Stay: Payer: Medicare Other

## 2017-06-17 DIAGNOSIS — K5090 Crohn's disease, unspecified, without complications: Secondary | ICD-10-CM | POA: Diagnosis not present

## 2017-06-17 DIAGNOSIS — D45 Polycythemia vera: Secondary | ICD-10-CM

## 2017-06-17 DIAGNOSIS — K640 First degree hemorrhoids: Secondary | ICD-10-CM | POA: Diagnosis not present

## 2017-06-17 DIAGNOSIS — I129 Hypertensive chronic kidney disease with stage 1 through stage 4 chronic kidney disease, or unspecified chronic kidney disease: Secondary | ICD-10-CM | POA: Diagnosis not present

## 2017-06-17 DIAGNOSIS — N189 Chronic kidney disease, unspecified: Secondary | ICD-10-CM | POA: Diagnosis not present

## 2017-06-17 DIAGNOSIS — N400 Enlarged prostate without lower urinary tract symptoms: Secondary | ICD-10-CM | POA: Diagnosis not present

## 2017-06-17 LAB — TRIGLYCERIDES: Triglycerides: 312 mg/dL — ABNORMAL HIGH (ref <150)

## 2017-06-17 LAB — COMPREHENSIVE METABOLIC PANEL
ALT: 20 U/L (ref 17–63)
AST: 45 U/L — ABNORMAL HIGH (ref 15–41)
Albumin: 3.9 g/dL (ref 3.5–5.0)
Alkaline Phosphatase: 56 U/L (ref 38–126)
Anion gap: 6 (ref 5–15)
BUN: 27 mg/dL — ABNORMAL HIGH (ref 6–20)
CO2: 23 mmol/L (ref 22–32)
Calcium: 8.6 mg/dL — ABNORMAL LOW (ref 8.9–10.3)
Chloride: 106 mmol/L (ref 101–111)
Creatinine, Ser: 1.49 mg/dL — ABNORMAL HIGH (ref 0.61–1.24)
GFR calc Af Amer: 53 mL/min — ABNORMAL LOW (ref >60)
GFR calc non Af Amer: 46 mL/min — ABNORMAL LOW (ref >60)
Glucose, Bld: 108 mg/dL — ABNORMAL HIGH (ref 65–99)
Potassium: 4.2 mmol/L (ref 3.5–5.1)
Sodium: 135 mmol/L (ref 135–145)
Total Bilirubin: 0.4 mg/dL (ref 0.3–1.2)
Total Protein: 7.2 g/dL (ref 6.5–8.1)

## 2017-06-17 LAB — CBC WITH DIFFERENTIAL/PLATELET
Basophils Absolute: 0.2 10*3/uL — ABNORMAL HIGH (ref 0–0.1)
Basophils Relative: 1 %
Eosinophils Absolute: 0.1 10*3/uL (ref 0–0.7)
Eosinophils Relative: 0 %
HCT: 32.6 % — ABNORMAL LOW (ref 40.0–52.0)
Hemoglobin: 10.8 g/dL — ABNORMAL LOW (ref 13.0–18.0)
Lymphocytes Relative: 10 %
Lymphs Abs: 1.7 10*3/uL (ref 1.0–3.6)
MCH: 24.1 pg — ABNORMAL LOW (ref 26.0–34.0)
MCHC: 32.9 g/dL (ref 32.0–36.0)
MCV: 73.3 fL — ABNORMAL LOW (ref 80.0–100.0)
Monocytes Absolute: 2.5 10*3/uL — ABNORMAL HIGH (ref 0.2–1.0)
Monocytes Relative: 15 %
Neutro Abs: 12.5 10*3/uL — ABNORMAL HIGH (ref 1.4–6.5)
Neutrophils Relative %: 74 %
Platelets: 239 10*3/uL (ref 150–440)
RBC: 4.46 MIL/uL (ref 4.40–5.90)
RDW: 20.8 % — ABNORMAL HIGH (ref 11.5–14.5)
WBC: 16.9 10*3/uL — ABNORMAL HIGH (ref 3.8–10.6)

## 2017-06-17 LAB — LACTATE DEHYDROGENASE: LDH: 720 U/L — ABNORMAL HIGH (ref 98–192)

## 2017-06-17 NOTE — Telephone Encounter (Signed)
-----   Message from Lequita Asal, MD sent at 06/17/2017  4:25 PM EDT ----- Regarding: Please call patient  Dramatic improvement in labs.  Check labs in 1 week (ensure counts won't go low).  M  ----- Message ----- From: Interface, Lab In [place] Sent: 06/17/2017   3:48 PM To: Lequita Asal, MD

## 2017-06-17 NOTE — Telephone Encounter (Signed)
Called patient and LVM that there has been a significant improvement in his labs.  Will recheck labs in one week.  Scheduler to call with that appt.

## 2017-06-18 ENCOUNTER — Telehealth: Payer: Self-pay | Admitting: *Deleted

## 2017-06-18 MED FILL — JAKAFI 25 MG TABLET: 25 | 30 days supply | Qty: 60 | Fill #1

## 2017-06-18 NOTE — Telephone Encounter (Signed)
-----   Message from Lequita Asal, MD sent at 06/18/2017  3:26 AM EDT ----- Regarding: Please call patient and send triglycerides to PCP  Triglycerides elevated.  Let patient and PCP know.  M  ----- Message ----- From: Interface, Lab In [place] Sent: 06/17/2017   3:48 PM To: Lequita Asal, MD

## 2017-06-18 NOTE — Telephone Encounter (Signed)
Called patient and LVM that triglycerides are elevated.  Informed patient that results have been sent to PCP.

## 2017-06-19 DIAGNOSIS — N1330 Unspecified hydronephrosis: Secondary | ICD-10-CM | POA: Diagnosis not present

## 2017-06-19 DIAGNOSIS — N183 Chronic kidney disease, stage 3 (moderate): Secondary | ICD-10-CM | POA: Diagnosis not present

## 2017-06-24 ENCOUNTER — Other Ambulatory Visit: Payer: Self-pay

## 2017-06-24 ENCOUNTER — Inpatient Hospital Stay: Payer: Medicare Other | Attending: Hematology and Oncology

## 2017-06-24 DIAGNOSIS — Z8042 Family history of malignant neoplasm of prostate: Secondary | ICD-10-CM | POA: Diagnosis not present

## 2017-06-24 DIAGNOSIS — M109 Gout, unspecified: Secondary | ICD-10-CM | POA: Diagnosis not present

## 2017-06-24 DIAGNOSIS — Z148 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: Secondary | ICD-10-CM | POA: Diagnosis not present

## 2017-06-24 DIAGNOSIS — K5090 Crohn's disease, unspecified, without complications: Secondary | ICD-10-CM | POA: Diagnosis not present

## 2017-06-24 DIAGNOSIS — D45 Polycythemia vera: Secondary | ICD-10-CM | POA: Insufficient documentation

## 2017-06-24 DIAGNOSIS — Z7982 Long term (current) use of aspirin: Secondary | ICD-10-CM | POA: Insufficient documentation

## 2017-06-24 DIAGNOSIS — Z803 Family history of malignant neoplasm of breast: Secondary | ICD-10-CM | POA: Diagnosis not present

## 2017-06-24 DIAGNOSIS — I4510 Unspecified right bundle-branch block: Secondary | ICD-10-CM | POA: Diagnosis not present

## 2017-06-24 DIAGNOSIS — Z807 Family history of other malignant neoplasms of lymphoid, hematopoietic and related tissues: Secondary | ICD-10-CM | POA: Diagnosis not present

## 2017-06-24 DIAGNOSIS — N401 Enlarged prostate with lower urinary tract symptoms: Secondary | ICD-10-CM | POA: Insufficient documentation

## 2017-06-24 DIAGNOSIS — Z800 Family history of malignant neoplasm of digestive organs: Secondary | ICD-10-CM | POA: Insufficient documentation

## 2017-06-24 DIAGNOSIS — Z79899 Other long term (current) drug therapy: Secondary | ICD-10-CM | POA: Insufficient documentation

## 2017-06-24 DIAGNOSIS — N2889 Other specified disorders of kidney and ureter: Secondary | ICD-10-CM | POA: Insufficient documentation

## 2017-06-24 DIAGNOSIS — Z85828 Personal history of other malignant neoplasm of skin: Secondary | ICD-10-CM | POA: Diagnosis not present

## 2017-06-24 DIAGNOSIS — Z87891 Personal history of nicotine dependence: Secondary | ICD-10-CM | POA: Insufficient documentation

## 2017-06-24 LAB — CBC WITH DIFFERENTIAL/PLATELET
Band Neutrophils: 2 %
Basophils Absolute: 0 10*3/uL (ref 0–0.1)
Basophils Relative: 0 %
Eosinophils Absolute: 0 10*3/uL (ref 0–0.7)
Eosinophils Relative: 0 %
HCT: 31.3 % — ABNORMAL LOW (ref 40.0–52.0)
Hemoglobin: 10.4 g/dL — ABNORMAL LOW (ref 13.0–18.0)
Lymphocytes Relative: 16 %
Lymphs Abs: 2 10*3/uL (ref 1.0–3.6)
MCH: 24.8 pg — ABNORMAL LOW (ref 26.0–34.0)
MCHC: 33.2 g/dL (ref 32.0–36.0)
MCV: 74.5 fL — ABNORMAL LOW (ref 80.0–100.0)
Metamyelocytes Relative: 2 %
Monocytes Absolute: 1.4 10*3/uL — ABNORMAL HIGH (ref 0.2–1.0)
Monocytes Relative: 11 %
Myelocytes: 2 %
Neutro Abs: 8.9 10*3/uL — ABNORMAL HIGH (ref 1.4–6.5)
Neutrophils Relative %: 65 %
Other: 2 %
Platelets: 242 10*3/uL (ref 150–440)
RBC: 4.2 MIL/uL — ABNORMAL LOW (ref 4.40–5.90)
RDW: 21.6 % — ABNORMAL HIGH (ref 11.5–14.5)
Smear Review: ADEQUATE
WBC: 12.6 10*3/uL — ABNORMAL HIGH (ref 3.8–10.6)
nRBC: 1 /100 WBC — ABNORMAL HIGH

## 2017-06-24 LAB — COMPREHENSIVE METABOLIC PANEL
ALT: 17 U/L (ref 17–63)
AST: 40 U/L (ref 15–41)
Albumin: 3.9 g/dL (ref 3.5–5.0)
Alkaline Phosphatase: 51 U/L (ref 38–126)
Anion gap: 7 (ref 5–15)
BUN: 28 mg/dL — ABNORMAL HIGH (ref 6–20)
CO2: 24 mmol/L (ref 22–32)
Calcium: 8.3 mg/dL — ABNORMAL LOW (ref 8.9–10.3)
Chloride: 106 mmol/L (ref 101–111)
Creatinine, Ser: 1.45 mg/dL — ABNORMAL HIGH (ref 0.61–1.24)
GFR calc Af Amer: 55 mL/min — ABNORMAL LOW (ref >60)
GFR calc non Af Amer: 47 mL/min — ABNORMAL LOW (ref >60)
Glucose, Bld: 107 mg/dL — ABNORMAL HIGH (ref 65–99)
Potassium: 4.4 mmol/L (ref 3.5–5.1)
Sodium: 137 mmol/L (ref 135–145)
Total Bilirubin: 0.4 mg/dL (ref 0.3–1.2)
Total Protein: 7.2 g/dL (ref 6.5–8.1)

## 2017-06-24 LAB — LACTATE DEHYDROGENASE: LDH: 728 U/L — ABNORMAL HIGH (ref 98–192)

## 2017-06-25 LAB — PATHOLOGIST SMEAR REVIEW

## 2017-06-26 DIAGNOSIS — N2581 Secondary hyperparathyroidism of renal origin: Secondary | ICD-10-CM | POA: Diagnosis not present

## 2017-06-26 DIAGNOSIS — R7989 Other specified abnormal findings of blood chemistry: Secondary | ICD-10-CM | POA: Diagnosis not present

## 2017-06-26 DIAGNOSIS — N179 Acute kidney failure, unspecified: Secondary | ICD-10-CM | POA: Diagnosis not present

## 2017-06-26 DIAGNOSIS — N183 Chronic kidney disease, stage 3 (moderate): Secondary | ICD-10-CM | POA: Diagnosis not present

## 2017-06-26 DIAGNOSIS — N1330 Unspecified hydronephrosis: Secondary | ICD-10-CM | POA: Diagnosis not present

## 2017-07-02 DIAGNOSIS — H401131 Primary open-angle glaucoma, bilateral, mild stage: Secondary | ICD-10-CM | POA: Diagnosis not present

## 2017-07-06 ENCOUNTER — Ambulatory Visit
Admission: RE | Admit: 2017-07-06 | Discharge: 2017-07-06 | Disposition: A | Discharge status: Home / Self Care | Payer: MEDICARE

## 2017-07-06 ENCOUNTER — Ambulatory Visit
Admission: RE | Admit: 2017-07-06 | Discharge: 2017-07-06 | Disposition: A | Discharge status: Home / Self Care | Payer: MEDICARE | Attending: Adult Health | Admitting: Adult Health

## 2017-07-06 DIAGNOSIS — D469 Myelodysplastic syndrome, unspecified: Secondary | ICD-10-CM | POA: Diagnosis not present

## 2017-07-06 DIAGNOSIS — E663 Overweight: Secondary | ICD-10-CM | POA: Diagnosis not present

## 2017-07-06 DIAGNOSIS — E611 Iron deficiency: Secondary | ICD-10-CM | POA: Diagnosis not present

## 2017-07-06 DIAGNOSIS — Z23 Encounter for immunization: Secondary | ICD-10-CM | POA: Diagnosis not present

## 2017-07-06 DIAGNOSIS — D45 Polycythemia vera: Principal | ICD-10-CM

## 2017-07-08 ENCOUNTER — Other Ambulatory Visit: Payer: Medicare Other

## 2017-07-08 ENCOUNTER — Ambulatory Visit: Payer: Medicare Other | Admitting: Hematology and Oncology

## 2017-07-11 ENCOUNTER — Other Ambulatory Visit: Payer: Self-pay | Admitting: Hematology and Oncology

## 2017-07-14 NOTE — Progress Notes (Signed)
Ruben [name] day:  07/15/2017  Chief Complaint: Ruben [name] is a 71 y.o. 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  HPI: The patient was last seen in the medical oncology [name] on 06/10/2017.  At that time, he felt good.  Exam was stable. Patient had been on the increased dose of Jakafi 13m BID x 2 weeks. WBC was 43,000 with an AGarvinof 36,100. Hemoglobin was 11.3, hematocrit 35.7, platelets 550,000.  Renal function was decreased (BUN 47 with a Cr of 2.52) - thought to be secondary to Bactrim. LDH had significantly increased to 1144 (previously 625).  Uric acid was normal at 5.4.  Repeat labs on 06/17/2017 revealed WBC of 16,900 with an ANC of 12,500. Hemoglobin was 10.8 with hematocrit 32.6. Platelets were 239,000. Renal function had improved (BUN 27 with Cr 1.49). LDH back down to 720. Triglycerides 312. Results were forwarded to Dr. FCaryn Sectionfor review.    Labs on 06/24/2017 revealed a WBC of 12,600 with an ACliffof 8,900. Hemoglobin was 10.4, hematocrit 31.3, and platelets 242,000. Electrolytes normal. Renal function had improved (BUN 28 and creatinine 1.45). LDH  Was 728. Pathologist smear review demonstrated changes consistent with known myeloproliferative neoplasm. There were less than 2% blasts.  During the interim, patient doing well. He has no physical complaints of anything today. Patient has no weakness, chest pain, or shortness of breath. Patient continues on oral iron supplementation on Monday, Wednesday, and Friday. Patient is eating well, with no unexplained weight loss.  Patient has been recently seen in follow up by Dr. BAdriana Simas    Past Medical History:  Diagnosis Date  . BPH (benign prostatic hyperplasia)   . [place] disease [place]   . [place]   . Glaucoma   . Glaucoma   . Gout   . History of chicken pox   . RBBB   . Right bundle branch block     Past Surgical History:   Procedure Laterality Date  . COLONOSCOPY WITH PROPOFOL N/A 03/27/2017   Procedure: COLONOSCOPY WITH PROPOFOL;  Surgeon: EManya Silvas MD;  [facility];  Service: Endoscopy;  Laterality: N/A;  . PROSTATE SURGERY  2008   Prostate Biopsy in [place] to elevated PSA.  per patient normal  . Skin Lesion Basal cell removed    . wart removal     from eyelid    Family History  Problem Relation Age of Onset  . Cancer Mother        Liver  . Cancer Father        Prostate  . Cancer Sister        Hodgkins lymphoma  . Cancer Paternal Aunt        Breast    Social History:  reports that he quit smoking about 13 years ago. His smoking use included Cigarettes. He has a 5.00 pack-year smoking history. He has never used smokeless tobacco. He reports that he drinks about 0.6 oz of alcohol per week . He reports that he does not use drugs.  He previously worked for [employer]  He volunteers for [employer].  [place]  The patient is accompanied by his wife, [name] today.  Allergies: No Known Allergies  Current Medications: Current Outpatient Prescriptions  Medication Sig Dispense Refill  . allopurinol (ZYLOPRIM) 300 MG tablet TAKE 1 TABLET (300 MG TOTAL) BY MOUTH  DAILY. 90 tablet 0  . aspirin 81 MG tablet Take 81 mg by mouth daily.    . Cholecalciferol (VITAMIN D3) 1000 units CAPS Take 1 capsule by mouth daily.    . Cyanocobalamin (RA VITAMIN B-12 TR) 1000 MCG TBCR     . ferrous sulfate 325 (65 FE) MG tablet Take 325 mg by mouth 3 (three) times a week.    . finasteride (PROSCAR) 5 MG tablet Take 1 tablet (5 mg total) by mouth daily. 90 tablet 3  . folic acid (FOLVITE) 1 MG tablet TAKE 1 TABLET (1,000 MCG TOTAL) BY MOUTH ONCE DAILY. MUST KEEP APPT FOR FURTHER REFILLS.    Marland Kitchen latanoprost (XALATAN) 0.005 % ophthalmic solution Apply 1 drop to eye daily.     . Multiple Vitamins-Minerals (CENTRUM SILVER ULTRA MENS) TABS Take  by mouth. Reported on 01/30/2016    . Omega-3 Fatty Acids (KP FISH OIL) 1200 MG CAPS Take by mouth.    . ruxolitinib phosphate (JAKAFI) 25 MG tablet Take 1 tablet (25 mg total) by mouth 2 (two) times daily. 60 tablet 5  . sodium bicarbonate 650 MG tablet Take 650 mg by mouth 2 (two) times daily.    . tamsulosin (FLOMAX) 0.4 MG CAPS capsule Take 1 capsule (0.4 mg total) by mouth daily. 30 capsule 3  . timolol (BETIMOL) 0.25 % ophthalmic solution Place 1 drop into both eyes daily.      No current facility-administered medications for this visit.     Review of Systems:  GENERAL:  Feels "good".  No fevers or sweats.  Weight up 3 pounds. PERFORMANCE STATUS (ECOG):  1 HEENT:  No visual changes, runny nose, sore throat, mouth sores or tenderness. Lungs: No shortness of breath or cough.  No hemoptysis. Cardiac:  No chest pain, palpitations, orthopnea, or PND. GI:  Diarrhea (chronic).  No nausea, vomiting, constipation, melena or hematochezia. GU:  Self catheterization 4x/day.  Typical volume 300-400 cc (800 cc in AM).  No urgency, dysuria, or hematuria. Musculoskeletal:  No back pain. No muscle tenderness. Extremities:  No bone pain.  No joint pain or swelling. Skin:  No rashes or skin changes. Neuro:  No headache, numbness or weakness, balance or coordination issues. Endocrine:  No diabetes, thyroid issues, hot flashes or night sweats. Psych:  No mood changes, depression or anxiety. Pain:  No pain. Review of systems:  All other systems reviewed and found to be negative.   Physical Exam: Blood pressure 132/78, pulse 60, temperature 98.1 F (36.7 C), temperature source Tympanic, resp. rate 20, weight 184 lb 8.4 oz (83.7 kg). GENERAL:  Well developed, well nourished, gentleman sitting comfortably in the exam room in no acute distress. MENTAL STATUS:  Alert and oriented to person, place and time. HEAD:  Pearline Cables hair.  Normocephalic, atraumatic, face symmetric, no Cushingoid features. EYES:  Glasses. Blue eyes. Pupils equal round and reactive to light and accomodation.  No conjunctivitis or scleral icterus. ENT: Oropharynx clear without lesion. Tongue normal. Mucous membranes moist.  RESPIRATORY: Clear to auscultation without rales, wheezes or rhonchi. CARDIOVASCULAR: Regular rate and rhythm without murmur, rub or gallop. ABDOMEN: Soft, non-tender, with active bowel sounds, and no appreciable hepatosplenomegaly.  No masses.  SKIN: No rashes, ulcers or lesions. EXTREMITIES: No edema, no skin discoloration or tenderness. No palpable cords. LYMPH NODES: No palpable cervical, supraclavicular, axillary or inguinal adenopathy  NEUROLOGICAL: Unremarkable. PSYCH: Appropriate.    Appointment on 07/15/2017  Component Date Value Ref Range Status  . Uric Acid, Serum 07/15/2017 4.4  4.4 - 7.6 mg/dL Final  . WBC 07/15/2017 17.2* 3.8 - 10.6 K/uL Final  . RBC 07/15/2017 4.56  4.40 - 5.90 MIL/uL Final  . Hemoglobin 07/15/2017 11.2* 13.0 - 18.0 g/dL Final  . HCT 07/15/2017 34.6* 40.0 - 52.0 % Final  . MCV 07/15/2017 76.0* 80.0 - 100.0 fL Final  . MCH 07/15/2017 24.6* 26.0 - 34.0 pg Final  . MCHC 07/15/2017 32.4  32.0 - 36.0 g/dL Final  . RDW 07/15/2017 22.6* 11.5 - 14.5 % Final  . Platelets 07/15/2017 302  150 - 440 K/uL Final  . Neutrophils Relative % 07/15/2017 74  % Final  . Neutro Abs 07/15/2017 12.7* 1.4 - 6.5 K/uL Final  . Lymphocytes Relative 07/15/2017 10  % Final  . Lymphs Abs 07/15/2017 1.7  1.0 - 3.6 K/uL Final  . Monocytes Relative 07/15/2017 15  % Final  . Monocytes Absolute 07/15/2017 2.6* 0.2 - 1.0 K/uL Final  . Eosinophils Relative 07/15/2017 0  % Final  . Eosinophils Absolute 07/15/2017 0.0  0 - 0.7 K/uL Final  . Basophils Relative 07/15/2017 1  % Final  . Basophils Absolute 07/15/2017 0.1  0 - 0.1 K/uL Final  . Smear Review 07/15/2017 SMEAR SCAN   Final  . Sodium 07/15/2017 137  135 - 145 mmol/L Final  . Potassium 07/15/2017 4.7  3.5 - 5.1 mmol/L Final  .  Chloride 07/15/2017 106  101 - 111 mmol/L Final  . CO2 07/15/2017 25  22 - 32 mmol/L Final  . Glucose, Bld 07/15/2017 101* 65 - 99 mg/dL Final  . BUN 07/15/2017 22* 6 - 20 mg/dL Final  . Creatinine, Ser 07/15/2017 1.44* 0.61 - 1.24 mg/dL Final  . Calcium 07/15/2017 8.9  8.9 - 10.3 mg/dL Final  . Total Protein 07/15/2017 7.5  6.5 - 8.1 g/dL Final  . Albumin 07/15/2017 4.2  3.5 - 5.0 g/dL Final  . AST 07/15/2017 40  15 - 41 U/L Final  . ALT 07/15/2017 17  17 - 63 U/L Final  . Alkaline Phosphatase 07/15/2017 53  38 - 126 U/L Final  . Total Bilirubin 07/15/2017 0.5  0.3 - 1.2 mg/dL Final  . GFR calc non Af Amer 07/15/2017 48* >60 mL/min Final  . GFR calc Af Amer 07/15/2017 55* >60 mL/min Final   Comment: (NOTE) The eGFR has been calculated using the CKD EPI equation. This calculation has not been validated in all clinical situations. eGFR's persistently <60 mL/min signify possible Chronic Kidney Disease.   . Anion gap 07/15/2017 6  5 - 15 Final  . LDH 07/15/2017 723* 98 - 192 U/L Final    Assessment:  Amarii Bordas is a 71 y.o. 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  He has had polycythemia dating back to 2013.  Hematocrit was 62.1 with a hemoglobin of 19.8 on 03/28/2015.  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  Erythropoietin level was 1.1 (low).  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  He began a phlebotomy program on 03/28/2015 to maintain a hematocrit goal of < 45.  He last underwent phlebotomy on 05/02/2015.  He is on a baby aspirin.  He is followed by GI (Dr. Vira Agar) for a history of polyps and Crohn's disease.  Last colonoscopy was 3-4 years ago.  He has become iron deficient on his phlebotomy program (ferritin 21 on 11/28/2015).   CBC on 11/28/2015 revealed a hematocrit of 39.3, hemoglobin 11.9, platelets 463,000, WBC 32,000 with an ANC of 25,000.  Differential included 71% segs,  4% lymphocytes and 17% monocytes.  Peripheral smear revealed leukocytosis with  predominantly mature neutrophils, increased monocytes and rare blasts (<1%).  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  Marrow was packed (95-100% cellularity) with pan myelosis, multi-lineage dyspoiesis, and no significant increase in blasts.  There was moderate to focally marked reticulin fibrosis (grade 2-3/3).  Storage iron was not identified.  Flow cytometry revealed non-specific atypical myeloid findings with no increase in blasts.  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  Cytogenetics were normal (46, XY).  B[facility] on [facility]/07/2016 [facility] revealed a hypercellular marrow (> 95%) with persistent involvement by myeloid neoplasm with 5% blasts.  There was mild reticulin fibrosis.  FISH t(9;22) results were normal.  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  Cytogenetics are pending.  He was briefly on hydroxyurea 500 mg a day (12/05/2015 - 12/19/2015 and 06/25/2016 - 07/03/2016).   Platelet count increased to 1.18 million and white count to 54,800 without increased blasts on 06/25/2016.  CBC on 07/02/2016 revealed a platelets of 429,000 with a white count 30,600.  He began allopurinol on 12/19/2015.  He began Jakafi 20 mg BID on 05/28/2016.  Jakafi was increased to 25 mg BID on 05/26/2017.  LDH is followed:  466 on 01/02/2016, 596 on 05/23/2016, 663 on 07/30/2016, 522 on 09/24/2016, 638 on 01/05/2017, 625 on 02/25/2017, 1144 on 06/10/2017, 720 on 06/17/2017, and 728 on 06/24/2017.  Triglycerides were 183 (< 150) on 08/20/2016, 180 on 01/05/2017, and 312 on 06/17/2017.  He has urinary retention.  Renal ultrasound on 07/28/2016 revealed bilateral hydronephrosis (right > left) and a large postvoid residual (2 liters) suggesting bladder dysfunction or outlet  obstruction.  He underwent temporary Foley catheter placement.  He performs I/O self catheterizations.  He is on Flomax and finasteride.  PSA was 2.02 on 07/09/2016.  He has chronic renal insufficiency (Cr 1.85; CrCl 36 ml/min).  SPEP on 09/23/2016 was negative.  Spot urine revealed 17.4% of 27.3 mg/dL.of a monoclonal protein.  24 hour urine on 10/16/2016 revealed no monoclonal protein.  Renal function transiently decreased in 06/2017 after Bactrim.  He is followed by nephrology and urology.  Symptomatically, he feels good.  Patient has no physical complaints. Exam is stable.  WBC is 4400. Hemoglobin is 11.2, hematocrit 34.6, MCV 76.0, and platelets 302,000. Creatinine 1.44 (CrCl 52.4 ml/min)- improved.   Plan: 1.  Labs today: CBC with diff, CMP, LDH, uric acid. 2.  Discuss interval elevated creatinine s/p Bactrim.  Creatinine back to baseline. 3.  Continue Jakafi 25 mg BID. 4.  Continue ferrous sulfate 325 mg on Mondays, Wednesday, Fridays. 5.  Follow-up with Dr. Caryn Section re: elevated triglycerides. 6.  Review notes from Dr Adriana Simas- done. 7.  RTC in 6 weeks for labs (CBC with diff). 8.  RTC in 3 months for MD assessment and labs (CBC with diff, CMP, LDH, uric acid).   Honor Loh, NP  07/15/2017 , 3:23 PM   I saw and evaluated the patient, participating in the key portions of the service and reviewing pertinent diagnostic studies and records.  I reviewed the nurse practitioner's note and agree with the findings and the plan.  The assessment and plan were discussed with the patient.  Several questions were asked by the patient and answered.   Lequita Asal, MD 07/15/2017,3:23 PM

## 2017-07-15 ENCOUNTER — Encounter: Payer: Self-pay | Admitting: Hematology and Oncology

## 2017-07-15 ENCOUNTER — Other Ambulatory Visit: Payer: Self-pay | Admitting: *Deleted

## 2017-07-15 ENCOUNTER — Inpatient Hospital Stay: Payer: Medicare Other

## 2017-07-15 ENCOUNTER — Inpatient Hospital Stay (HOSPITAL_BASED_OUTPATIENT_CLINIC_OR_DEPARTMENT_OTHER): Payer: Medicare Other | Admitting: Hematology and Oncology

## 2017-07-15 VITALS — BP 132/78 | HR 60 | Temp 98.1°F | Resp 20 | Wt 184.5 lb

## 2017-07-15 DIAGNOSIS — N401 Enlarged prostate with lower urinary tract symptoms: Secondary | ICD-10-CM | POA: Diagnosis not present

## 2017-07-15 DIAGNOSIS — Z7982 Long term (current) use of aspirin: Secondary | ICD-10-CM

## 2017-07-15 DIAGNOSIS — D7581 Myelofibrosis: Secondary | ICD-10-CM

## 2017-07-15 DIAGNOSIS — Z807 Family history of other malignant neoplasms of lymphoid, hematopoietic and related tissues: Secondary | ICD-10-CM

## 2017-07-15 DIAGNOSIS — Z85828 Personal history of other malignant neoplasm of skin: Secondary | ICD-10-CM | POA: Diagnosis not present

## 2017-07-15 DIAGNOSIS — D45 Polycythemia vera: Secondary | ICD-10-CM

## 2017-07-15 DIAGNOSIS — Z148 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: Secondary | ICD-10-CM | POA: Diagnosis not present

## 2017-07-15 DIAGNOSIS — Z79899 Other long term (current) drug therapy: Secondary | ICD-10-CM | POA: Diagnosis not present

## 2017-07-15 DIAGNOSIS — K5090 Crohn's disease, unspecified, without complications: Secondary | ICD-10-CM | POA: Diagnosis not present

## 2017-07-15 DIAGNOSIS — Z87891 Personal history of nicotine dependence: Secondary | ICD-10-CM | POA: Diagnosis not present

## 2017-07-15 DIAGNOSIS — Z800 Family history of malignant neoplasm of digestive organs: Secondary | ICD-10-CM | POA: Diagnosis not present

## 2017-07-15 DIAGNOSIS — M109 Gout, unspecified: Secondary | ICD-10-CM | POA: Diagnosis not present

## 2017-07-15 DIAGNOSIS — I4510 Unspecified right bundle-branch block: Secondary | ICD-10-CM

## 2017-07-15 DIAGNOSIS — N2889 Other specified disorders of kidney and ureter: Secondary | ICD-10-CM | POA: Diagnosis not present

## 2017-07-15 DIAGNOSIS — Z803 Family history of malignant neoplasm of breast: Secondary | ICD-10-CM

## 2017-07-15 DIAGNOSIS — Z8042 Family history of malignant neoplasm of prostate: Secondary | ICD-10-CM

## 2017-07-15 LAB — COMPREHENSIVE METABOLIC PANEL
ALT: 17 U/L (ref 17–63)
AST: 40 U/L (ref 15–41)
Albumin: 4.2 g/dL (ref 3.5–5.0)
Alkaline Phosphatase: 53 U/L (ref 38–126)
Anion gap: 6 (ref 5–15)
BUN: 22 mg/dL — ABNORMAL HIGH (ref 6–20)
CO2: 25 mmol/L (ref 22–32)
Calcium: 8.9 mg/dL (ref 8.9–10.3)
Chloride: 106 mmol/L (ref 101–111)
Creatinine, Ser: 1.44 mg/dL — ABNORMAL HIGH (ref 0.61–1.24)
GFR calc Af Amer: 55 mL/min — ABNORMAL LOW (ref >60)
GFR calc non Af Amer: 48 mL/min — ABNORMAL LOW (ref >60)
Glucose, Bld: 101 mg/dL — ABNORMAL HIGH (ref 65–99)
Potassium: 4.7 mmol/L (ref 3.5–5.1)
Sodium: 137 mmol/L (ref 135–145)
Total Bilirubin: 0.5 mg/dL (ref 0.3–1.2)
Total Protein: 7.5 g/dL (ref 6.5–8.1)

## 2017-07-15 LAB — CBC WITH DIFFERENTIAL/PLATELET
Basophils Absolute: 0.1 10*3/uL (ref 0–0.1)
Basophils Relative: 1 %
Eosinophils Absolute: 0 10*3/uL (ref 0–0.7)
Eosinophils Relative: 0 %
HCT: 34.6 % — ABNORMAL LOW (ref 40.0–52.0)
Hemoglobin: 11.2 g/dL — ABNORMAL LOW (ref 13.0–18.0)
Lymphocytes Relative: 10 %
Lymphs Abs: 1.7 10*3/uL (ref 1.0–3.6)
MCH: 24.6 pg — ABNORMAL LOW (ref 26.0–34.0)
MCHC: 32.4 g/dL (ref 32.0–36.0)
MCV: 76 fL — ABNORMAL LOW (ref 80.0–100.0)
Monocytes Absolute: 2.6 10*3/uL — ABNORMAL HIGH (ref 0.2–1.0)
Monocytes Relative: 15 %
Neutro Abs: 12.7 10*3/uL — ABNORMAL HIGH (ref 1.4–6.5)
Neutrophils Relative %: 74 %
Platelets: 302 10*3/uL (ref 150–440)
RBC: 4.56 MIL/uL (ref 4.40–5.90)
RDW: 22.6 % — ABNORMAL HIGH (ref 11.5–14.5)
WBC: 17.2 10*3/uL — ABNORMAL HIGH (ref 3.8–10.6)

## 2017-07-15 LAB — URIC ACID: Uric Acid, Serum: 4.4 mg/dL (ref 4.4–7.6)

## 2017-07-15 LAB — LACTATE DEHYDROGENASE: LDH: 723 U/L — ABNORMAL HIGH (ref 98–192)

## 2017-07-15 NOTE — Progress Notes (Signed)
Patient offers no concerns today. 

## 2017-07-17 ENCOUNTER — Other Ambulatory Visit: Payer: Self-pay

## 2017-07-17 MED ORDER — FINASTERIDE 5 MG PO TABS: 5.0000 mg | ORAL_TABLET | Freq: Every day | ORAL | 0 refills | Status: DC

## 2017-07-17 MED FILL — JAKAFI 25 MG TABLET: 25 | 30 days supply | Qty: 60 | Fill #2

## 2017-07-24 ENCOUNTER — Other Ambulatory Visit: Payer: Self-pay

## 2017-07-24 DIAGNOSIS — N401 Enlarged prostate with lower urinary tract symptoms: Secondary | ICD-10-CM

## 2017-07-24 MED ORDER — TAMSULOSIN HCL 0.4 MG PO CAPS: 0.4000 mg | ORAL_CAPSULE | Freq: Every day | ORAL | 0 refills | Status: DC

## 2017-08-18 ENCOUNTER — Telehealth: Payer: Self-pay | Admitting: Pharmacist

## 2017-08-18 NOTE — Telephone Encounter (Signed)
Oral Chemotherapy Pharmacist Encounter  Follow-Up Form  Called patient today to follow up regarding patient's oral chemotherapy medication: Jakafi (ruxolitinib)  Original Start date of oral chemotherapy: 05/28/2016  Pt reports 0 tablets/doses of Jakafi missed in the last month. Reviewed plan for missed doses.   Pt reports the following side effects: N/A, he is doing well since the dose increase and reports no issues.  Recent labs reviewed: N/A  Other Issues: N/A  Patient knows to call the office with questions or concerns. Oral Oncology Clinic will continue to follow.  Darl Pikes, PharmD, BCPS Hematology/Oncology Clinical Pharmacist [facility] [phone]  08/18/2017 2:00 PM

## 2017-08-19 MED FILL — JAKAFI 25 MG TABLET: 25 | 30 days supply | Qty: 60 | Fill #3

## 2017-08-20 ENCOUNTER — Other Ambulatory Visit: Payer: Self-pay

## 2017-08-20 DIAGNOSIS — N400 Enlarged prostate without lower urinary tract symptoms: Secondary | ICD-10-CM

## 2017-08-21 ENCOUNTER — Other Ambulatory Visit
Admission: RE | Admit: 2017-08-21 | Discharge: 2017-08-21 | Disposition: A | Discharge status: Home / Self Care | Payer: Medicare Other | Source: Ambulatory Visit | Attending: Urology | Admitting: Urology

## 2017-08-21 ENCOUNTER — Encounter: Payer: Self-pay | Admitting: Urology

## 2017-08-21 ENCOUNTER — Ambulatory Visit (INDEPENDENT_AMBULATORY_CARE_PROVIDER_SITE_OTHER): Payer: Medicare Other | Admitting: Urology

## 2017-08-21 VITALS — BP 119/67 | HR 69 | Ht 72.0 in | Wt 180.0 lb

## 2017-08-21 DIAGNOSIS — N183 Chronic kidney disease, stage 3 unspecified: Secondary | ICD-10-CM

## 2017-08-21 DIAGNOSIS — N138 Other obstructive and reflux uropathy: Secondary | ICD-10-CM | POA: Diagnosis not present

## 2017-08-21 DIAGNOSIS — N401 Enlarged prostate with lower urinary tract symptoms: Secondary | ICD-10-CM

## 2017-08-21 DIAGNOSIS — N400 Enlarged prostate without lower urinary tract symptoms: Secondary | ICD-10-CM | POA: Diagnosis not present

## 2017-08-21 LAB — URINALYSIS, COMPLETE (UACMP) WITH MICROSCOPIC
BILIRUBIN URINE: NEGATIVE
Bacteria, UA: NONE SEEN
Glucose, UA: NEGATIVE mg/dL
HGB URINE DIPSTICK: NEGATIVE
Ketones, ur: NEGATIVE mg/dL
Leukocytes, UA: NEGATIVE
NITRITE: NEGATIVE
PH: 7 (ref 5.0–8.0)
Protein, ur: 30 mg/dL — AB
RBC / HPF: NONE SEEN RBC/hpf (ref 0–5)
SPECIFIC GRAVITY, URINE: 1.015 (ref 1.005–1.030)
Squamous Epithelial / LPF: NONE SEEN

## 2017-08-21 NOTE — Progress Notes (Signed)
08/21/2017 1:36 PM   Ruben [name] 1945/12/13 [id number]  Referring provider: [name], [address] [place], [place] [address]  Chief Complaint  Patient presents with  . Follow-up    HPI: 71 year old male with history of polycythemia vera, Crohn's diease who with history of chronic urinary retention now managed with self-catheterization.  In the past including urodynamics and outlet procedure were discussed.  He is elected not to pursue any further intervention and has been on clean intermittent catheterization.  He is catheterizing 4-5 times daily which is new since but he is successful in this.  He voids a very small amount in the morning spontaneously, otherwise, has no desire or sensation of needing to void throughout the day.  He does have chronic kidney disease, most recent creatinine 1.44 in 06/2017 which appears to be his new baseline.    He continues to take Flomax and finasteride.  Most recent PSA 2.02 on 07/09/2016.  Rectal exam showed a benign, enlarged gland without nodules.  He did see a urologist about 15 years ago for elevated PSA. He underwent prostate biopsy which was negative. His PSA has since normalized.  He was treated for urinary tract infection once this year.  Urine culture from 05/2017 grew Enterobacter with variable sensitivity.   PMH: Past Medical History:  Diagnosis Date  . BPH (benign prostatic hyperplasia)   . Crohn disease ([place])   . Crohn's disease ([place])   . Glaucoma   . Glaucoma   . Gout   . History of chicken pox   . RBBB   . Right bundle branch block     Surgical History: Past Surgical History:  Procedure Laterality Date  . COLONOSCOPY WITH PROPOFOL N/A 03/27/2017   Procedure: COLONOSCOPY WITH PROPOFOL;  Surgeon: [name], MD;  [facility];  Service: Endoscopy;  Laterality: N/A;  . PROSTATE SURGERY  2008   Prostate Biopsy in [place] to elevated PSA.  per patient normal  .  Skin Lesion Basal cell removed    . wart removal     from eyelid    Home Medications:  Allergies as of 08/21/2017   No Known Allergies     Medication List        Accurate as of 08/21/17  1:36 PM. Always use your most recent med list.          allopurinol 300 MG tablet Commonly known as:  ZYLOPRIM TAKE 1 TABLET (300 MG TOTAL) BY MOUTH DAILY.   aspirin 81 MG tablet Take 81 mg by mouth daily.   CENTRUM SILVER ULTRA MENS Tabs Take by mouth. Reported on 01/30/2016   ferrous sulfate 325 (65 FE) MG tablet Take 325 mg by mouth 3 (three) times a week.   finasteride 5 MG tablet Commonly known as:  PROSCAR Take 1 tablet (5 mg total) by mouth daily.   folic acid 1 MG tablet Commonly known as:  FOLVITE TAKE 1 TABLET (1,000 MCG TOTAL) BY MOUTH ONCE DAILY. MUST KEEP APPT FOR FURTHER REFILLS.   KP FISH OIL 1200 MG Caps Take by mouth.   latanoprost 0.005 % ophthalmic solution Commonly known as:  XALATAN Apply 1 drop to eye daily.   RA VITAMIN B-12 TR 1000 MCG Tbcr Generic drug:  Cyanocobalamin   ruxolitinib phosphate 25 MG tablet Commonly known as:  JAKAFI Take 1 tablet (25 mg total) by mouth 2 (two) times daily.   sodium bicarbonate 650 MG tablet Take 650 mg by mouth 2 (  two) times daily.   timolol 0.25 % ophthalmic solution Commonly known as:  BETIMOL Place 1 drop into both eyes daily.   Vitamin D3 1000 units Caps Take 1 capsule by mouth daily.       Allergies: No Known Allergies  Family History: Family History  Problem Relation Age of Onset  . Cancer Mother        Liver  . Cancer Father        Prostate  . Cancer Sister        Hodgkins lymphoma  . Cancer Paternal Aunt        Breast    Social History:  reports that he quit smoking about 13 years ago. His smoking use included cigarettes. He has a 5.00 pack-year smoking history. he has never used smokeless tobacco. He reports that he drinks about 0.6 oz of alcohol per week. He reports that he does not use  drugs.  ROS: UROLOGY Frequent Urination?: No Hard to postpone urination?: No Burning/pain with urination?: No Get up at night to urinate?: No Leakage of urine?: No Urine stream starts and stops?: No Trouble starting stream?: No Do you have to strain to urinate?: No Blood in urine?: No Urinary tract infection?: No Sexually transmitted disease?: No Injury to kidneys or bladder?: No Painful intercourse?: No Weak stream?: No Erection problems?: No Penile pain?: No  Gastrointestinal Nausea?: No Vomiting?: No Indigestion/heartburn?: No Diarrhea?: No Constipation?: No  Constitutional Fever: No Night sweats?: No Weight loss?: No Fatigue?: No  Skin Skin rash/lesions?: No Itching?: No  Eyes Blurred vision?: No Double vision?: No  Ears/Nose/Throat Sore throat?: No Sinus problems?: No  Hematologic/Lymphatic Swollen glands?: No Easy bruising?: No  Cardiovascular Leg swelling?: No Chest pain?: No  Respiratory Cough?: No Shortness of breath?: No  Endocrine Excessive thirst?: No  Musculoskeletal Back pain?: No Joint pain?: No  Neurological Headaches?: No Dizziness?: No  Psychologic Depression?: No Anxiety?: No  Physical Exam: BP 119/67   Pulse 69   Ht 6' (1.829 m)   Wt 180 lb (81.6 kg)   BMI 24.41 kg/m   Constitutional:  Alert and oriented, No acute distress.  Wife present today. HEENT: [place] AT, moist mucus membranes.  Trachea midline, no masses. Cardiovascular: No clubbing, cyanosis, or edema. Respiratory: Normal respiratory effort, no increased work of breathing. GI: Abdomen is soft, nontender.   Rectal: Normal sphincter tone. Enlarged 50+ cc prostate, no obvious nodules, nontender.  Skin: No rashes, bruises or suspicious lesions. Neurologic: Grossly intact, no focal deficits, moving all 4 extremities. Psychiatric: Normal mood and affect.  Laboratory Data: Lab Results  Component Value Date   WBC 17.2 (H) 07/15/2017   HGB 11.2 (L)  07/15/2017   HCT 34.6 (L) 07/15/2017   MCV 76.0 (L) 07/15/2017   PLT 302 07/15/2017    Lab Results  Component Value Date   CREATININE 1.44 (H) 07/15/2017    Lab Results  Component Value Date   PSA 2.02 07/09/2016   PSA 2.1 04/10/2014    Urinalysis N/a  Pertinent Imaging: No new imaging  Assessment & Plan:    1. Urinary retention Chronic retention managed with clean intermittent catheterization Not currently interested in any further intervention including outlet procedure, personally feels that his retention is related to his bladder rather than obstruction Continue CIC 4-5 times daily Would recommend cystoscopy in 1 year  2. CKD (chronic kidney disease) stage 3, GFR 30-59 ml/min Creatinine stable  3. BPH with obstruction/lower urinary tract symptoms Continue finasteride Given that he  does not voiding spontaneously, limited benefit to Flomax Recommend stopping Flomax at this time PSA screening performed today    Return in about 1 year (around 08/21/2018) for cysto.  Hollice Espy, MD  [facility] [place] [place], [place] [address] [phone]

## 2017-08-24 DIAGNOSIS — C44329 Squamous cell carcinoma of skin of other parts of face: Secondary | ICD-10-CM | POA: Diagnosis not present

## 2017-08-24 DIAGNOSIS — D485 Neoplasm of uncertain behavior of skin: Secondary | ICD-10-CM | POA: Diagnosis not present

## 2017-08-26 ENCOUNTER — Inpatient Hospital Stay: Payer: Medicare Other | Attending: Hematology and Oncology

## 2017-08-26 DIAGNOSIS — D45 Polycythemia vera: Secondary | ICD-10-CM

## 2017-08-26 DIAGNOSIS — C61 Malignant neoplasm of prostate: Secondary | ICD-10-CM | POA: Diagnosis not present

## 2017-08-26 DIAGNOSIS — N138 Other obstructive and reflux uropathy: Secondary | ICD-10-CM

## 2017-08-26 DIAGNOSIS — N401 Enlarged prostate with lower urinary tract symptoms: Secondary | ICD-10-CM

## 2017-08-26 LAB — CBC WITH DIFFERENTIAL/PLATELET
Band Neutrophils: 3 %
Basophils Absolute: 0.2 10*3/uL — ABNORMAL HIGH (ref 0–0.1)
Basophils Relative: 1 %
Blasts: 2 %
Eosinophils Absolute: 0.2 10*3/uL (ref 0–0.7)
Eosinophils Relative: 1 %
HCT: 35.4 % — ABNORMAL LOW (ref 40.0–52.0)
Hemoglobin: 11.5 g/dL — ABNORMAL LOW (ref 13.0–18.0)
Lymphocytes Relative: 13 %
Lymphs Abs: 2.5 10*3/uL (ref 1.0–3.6)
MCH: 25.3 pg — ABNORMAL LOW (ref 26.0–34.0)
MCHC: 32.4 g/dL (ref 32.0–36.0)
MCV: 78.1 fL — ABNORMAL LOW (ref 80.0–100.0)
Metamyelocytes Relative: 3 %
Monocytes Absolute: 3.1 10*3/uL — ABNORMAL HIGH (ref 0.2–1.0)
Monocytes Relative: 16 %
Myelocytes: 3 %
Neutro Abs: 13.1 10*3/uL — ABNORMAL HIGH (ref 1.4–6.5)
Neutrophils Relative %: 58 %
Platelets: 346 10*3/uL (ref 150–440)
RBC: 4.53 MIL/uL (ref 4.40–5.90)
RDW: 22.5 % — ABNORMAL HIGH (ref 11.5–14.5)
WBC: 19.6 10*3/uL — ABNORMAL HIGH (ref 3.8–10.6)

## 2017-08-27 LAB — PSA: PROSTATIC SPECIFIC ANTIGEN: 0.56 ng/mL (ref 0.00–4.00)

## 2017-08-28 ENCOUNTER — Telehealth: Payer: Self-pay

## 2017-08-28 NOTE — Telephone Encounter (Signed)
-----   Message from Hollice Espy, MD sent at 08/27/2017 10:30 AM EST ----- PSA is excellent.  Hollice Espy, MD

## 2017-08-28 NOTE — Telephone Encounter (Signed)
Will send a letter

## 2017-09-04 DIAGNOSIS — C44329 Squamous cell carcinoma of skin of other parts of face: Secondary | ICD-10-CM | POA: Diagnosis not present

## 2017-09-04 DIAGNOSIS — L905 Scar conditions and fibrosis of skin: Secondary | ICD-10-CM | POA: Diagnosis not present

## 2017-09-17 ENCOUNTER — Other Ambulatory Visit: Payer: Self-pay

## 2017-09-17 MED ORDER — FINASTERIDE 5 MG PO TABS: 5.0000 mg | ORAL_TABLET | Freq: Every day | ORAL | 3 refills | Status: DC

## 2017-09-17 MED FILL — JAKAFI 25 MG TABLET: 25 | 30 days supply | Qty: 60 | Fill #4

## 2017-10-05 ENCOUNTER — Other Ambulatory Visit: Payer: Self-pay | Admitting: *Deleted

## 2017-10-05 MED ORDER — ALLOPURINOL 300 MG PO TABS: 300.0000 mg | ORAL_TABLET | Freq: Every day | ORAL | 0 refills | Status: DC

## 2017-10-14 ENCOUNTER — Encounter: Payer: Self-pay | Admitting: Hematology and Oncology

## 2017-10-14 ENCOUNTER — Inpatient Hospital Stay: Payer: Medicare Other

## 2017-10-14 ENCOUNTER — Other Ambulatory Visit: Payer: Self-pay | Admitting: Urgent Care

## 2017-10-14 ENCOUNTER — Inpatient Hospital Stay: Payer: Medicare Other | Attending: Hematology and Oncology | Admitting: Hematology and Oncology

## 2017-10-14 ENCOUNTER — Other Ambulatory Visit: Payer: Self-pay | Admitting: *Deleted

## 2017-10-14 VITALS — BP 142/86 | HR 61 | Temp 98.6°F | Resp 18 | Wt 193.1 lb

## 2017-10-14 DIAGNOSIS — Z79899 Other long term (current) drug therapy: Secondary | ICD-10-CM

## 2017-10-14 DIAGNOSIS — M109 Gout, unspecified: Secondary | ICD-10-CM | POA: Diagnosis not present

## 2017-10-14 DIAGNOSIS — Z148 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: Secondary | ICD-10-CM | POA: Diagnosis not present

## 2017-10-14 DIAGNOSIS — Z87891 Personal history of nicotine dependence: Secondary | ICD-10-CM | POA: Insufficient documentation

## 2017-10-14 DIAGNOSIS — Z803 Family history of malignant neoplasm of breast: Secondary | ICD-10-CM

## 2017-10-14 DIAGNOSIS — Z8042 Family history of malignant neoplasm of prostate: Secondary | ICD-10-CM | POA: Insufficient documentation

## 2017-10-14 DIAGNOSIS — D7581 Myelofibrosis: Secondary | ICD-10-CM

## 2017-10-14 DIAGNOSIS — D45 Polycythemia vera: Secondary | ICD-10-CM

## 2017-10-14 DIAGNOSIS — N189 Chronic kidney disease, unspecified: Secondary | ICD-10-CM | POA: Insufficient documentation

## 2017-10-14 DIAGNOSIS — N401 Enlarged prostate with lower urinary tract symptoms: Secondary | ICD-10-CM | POA: Diagnosis not present

## 2017-10-14 DIAGNOSIS — Z7982 Long term (current) use of aspirin: Secondary | ICD-10-CM | POA: Diagnosis not present

## 2017-10-14 DIAGNOSIS — N1330 Unspecified hydronephrosis: Secondary | ICD-10-CM | POA: Insufficient documentation

## 2017-10-14 DIAGNOSIS — E781 Pure hyperglyceridemia: Secondary | ICD-10-CM

## 2017-10-14 DIAGNOSIS — Z806 Family history of leukemia: Secondary | ICD-10-CM | POA: Diagnosis not present

## 2017-10-14 DIAGNOSIS — I4510 Unspecified right bundle-branch block: Secondary | ICD-10-CM

## 2017-10-14 DIAGNOSIS — Z800 Family history of malignant neoplasm of digestive organs: Secondary | ICD-10-CM

## 2017-10-14 DIAGNOSIS — K5090 Crohn's disease, unspecified, without complications: Secondary | ICD-10-CM | POA: Diagnosis not present

## 2017-10-14 LAB — COMPREHENSIVE METABOLIC PANEL
ALT: 17 U/L (ref 17–63)
AST: 37 U/L (ref 15–41)
Albumin: 4.2 g/dL (ref 3.5–5.0)
Alkaline Phosphatase: 54 U/L (ref 38–126)
Anion gap: 7 (ref 5–15)
BUN: 26 mg/dL — ABNORMAL HIGH (ref 6–20)
CO2: 26 mmol/L (ref 22–32)
Calcium: 9.1 mg/dL (ref 8.9–10.3)
Chloride: 103 mmol/L (ref 101–111)
Creatinine, Ser: 1.46 mg/dL — ABNORMAL HIGH (ref 0.61–1.24)
GFR calc Af Amer: 54 mL/min — ABNORMAL LOW (ref >60)
GFR calc non Af Amer: 47 mL/min — ABNORMAL LOW (ref >60)
Glucose, Bld: 95 mg/dL (ref 65–99)
Potassium: 4.1 mmol/L (ref 3.5–5.1)
Sodium: 136 mmol/L (ref 135–145)
Total Bilirubin: 0.6 mg/dL (ref 0.3–1.2)
Total Protein: 8 g/dL (ref 6.5–8.1)

## 2017-10-14 LAB — CBC WITH DIFFERENTIAL/PLATELET
Basophils Absolute: 0.1 10*3/uL (ref 0–0.1)
Basophils Relative: 1 %
Eosinophils Absolute: 0.1 10*3/uL (ref 0–0.7)
Eosinophils Relative: 1 %
HCT: 36.4 % — ABNORMAL LOW (ref 40.0–52.0)
Hemoglobin: 11.9 g/dL — ABNORMAL LOW (ref 13.0–18.0)
Lymphocytes Relative: 9 %
Lymphs Abs: 1.4 10*3/uL (ref 1.0–3.6)
MCH: 25.9 pg — ABNORMAL LOW (ref 26.0–34.0)
MCHC: 32.8 g/dL (ref 32.0–36.0)
MCV: 78.9 fL — ABNORMAL LOW (ref 80.0–100.0)
Monocytes Absolute: 2.1 10*3/uL — ABNORMAL HIGH (ref 0.2–1.0)
Monocytes Relative: 13 %
Neutro Abs: 12.6 10*3/uL — ABNORMAL HIGH (ref 1.4–6.5)
Neutrophils Relative %: 76 %
Platelets: 237 10*3/uL (ref 150–440)
RBC: 4.61 MIL/uL (ref 4.40–5.90)
RDW: 21.4 % — ABNORMAL HIGH (ref 11.5–14.5)
WBC: 16.4 10*3/uL — ABNORMAL HIGH (ref 3.8–10.6)

## 2017-10-14 LAB — LACTATE DEHYDROGENASE: LDH: 703 U/L — ABNORMAL HIGH (ref 98–192)

## 2017-10-14 LAB — URIC ACID: Uric Acid, Serum: 4.8 mg/dL (ref 4.4–7.6)

## 2017-10-14 NOTE — Progress Notes (Signed)
Patient offers no complaints today. 

## 2017-10-14 NOTE — Progress Notes (Signed)
[facility] day:  10/14/2017  Chief Complaint: Ruben [name] is a 72 y.o. 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  HPI: The patient was last seen in the medical oncology clinic on 07/15/2017.  At that time,  he felt good.  Patient had no physical complaints. Exam was stable.  WBC was 4400.  Hemoglobin was 11.2, hematocrit 34.6, MCV 76.0, and platelets 302,000. Creatinine was 1.44 (CrCl 52.4 ml/min)- improved.   He continued Jakafi 25 mg BID and ferrous sulfate 325 mg on Mondays, Wednesday, Fridays.  He was scheduled to follow-up with Dr. Caryn Section regarding his elevated triglycerides.  CBC on 08/26/2017 revealed a hematocrit of 35.4, hemoglobin 11.5, MCV 78.1, platelets 346,000, and WBC 19,600.  ANC was 13,100.  There were 2% blasts  During the interim, he has felt "good". He denies acute physical concerns. Patient continues on Jakafi as prescribed with no perceived side effects. Patient is taking his oral iron supplementation on Monday, Wednesday, and Fridays.  He is eating well. He has gained 13 pounds since his last visit in November. Patient notes that his ideal body weight is 175-180 pounds. Patients states, "I have not been as active".   He feels well. He denies B symptoms and interval infections. Patient has no pain today in the clinic.    Past Medical History:  Diagnosis Date  . BPH (benign prostatic hyperplasia)   . Crohn disease ([place])   . Crohn's disease ([place])   . Glaucoma   . Glaucoma   . Gout   . History of chicken pox   . RBBB   . Right bundle branch block     Past Surgical History:  Procedure Laterality Date  . COLONOSCOPY WITH PROPOFOL N/A 03/27/2017   Procedure: COLONOSCOPY WITH PROPOFOL;  Surgeon: [name], MD;  Location: [facility] ENDOSCOPY;  Service: Endoscopy;  Laterality: N/A;  . PROSTATE SURGERY  2008   Prostate Biopsy in [place] to elevated PSA.  per patient normal  .  Skin Lesion Basal cell removed    . wart removal     from eyelid    Family History  Problem Relation Age of Onset  . Cancer Mother        Liver  . Cancer Father        Prostate  . Cancer Sister        Hodgkins lymphoma  . Cancer Paternal Aunt        Breast    Social History:  reports that he quit smoking about 14 years ago. His smoking use included cigarettes. He has a 5.00 pack-year smoking history. he has never used smokeless tobacco. He reports that he drinks about 0.6 oz of alcohol per week. He reports that he does not use drugs.  He previously worked for [employer].  He volunteers for [employer].  He will be travelling to [facility] from 08/13/2016 - 08/17/2016.  The patient is accompanied by his wife, [name], today.  Allergies: No Known Allergies  Current Medications: Current Outpatient Medications  Medication Sig Dispense Refill  . allopurinol (ZYLOPRIM) 300 MG tablet Take 1 tablet (300 mg total) by mouth daily. 90 tablet 0  . aspirin 81 MG tablet Take 81 mg by mouth daily.    . Cholecalciferol (VITAMIN D3) 1000 units CAPS Take 1 capsule by mouth daily.    . Cyanocobalamin (RA VITAMIN B-12 TR) 1000 MCG TBCR     .  ferrous sulfate 325 (65 FE) MG tablet Take 325 mg by mouth 3 (three) times a week.    . finasteride (PROSCAR) 5 MG tablet Take 1 tablet (5 mg total) by mouth daily. 90 tablet 3  . folic acid (FOLVITE) 1 MG tablet TAKE 1 TABLET (1,000 MCG TOTAL) BY MOUTH ONCE DAILY. MUST KEEP APPT FOR FURTHER REFILLS.    Marland Kitchen latanoprost (XALATAN) 0.005 % ophthalmic solution Apply 1 drop to eye daily.     . Multiple Vitamins-Minerals (CENTRUM SILVER ULTRA MENS) TABS Take by mouth. Reported on 01/30/2016    . Omega-3 Fatty Acids (KP FISH OIL) 1200 MG CAPS Take by mouth.    . ruxolitinib phosphate (JAKAFI) 25 MG tablet Take 1 tablet (25 mg total) by mouth 2 (two) times daily. 60 tablet 5  . sodium bicarbonate 650 MG tablet Take 650 mg by mouth 2 (two) times daily.    . timolol  (BETIMOL) 0.25 % ophthalmic solution Place 1 drop into both eyes daily.      No current facility-administered medications for this visit.     Review of Systems:  GENERAL:  Feels "good".  No fevers or sweats.  Weight up 13 pounds. PERFORMANCE STATUS (ECOG):  1 HEENT:  No visual changes, runny nose, sore throat, mouth sores or tenderness. Lungs: No shortness of breath or cough.  No hemoptysis. Cardiac:  No chest pain, palpitations, orthopnea, or PND. GI:  Diarrhea (chronic).  No nausea, vomiting, constipation, melena or hematochezia. GU:  Self catheterization 4x/day.  Typical volume 300-400 cc (800 cc in AM).  No urgency, dysuria, or hematuria. Musculoskeletal:  No back pain. No muscle tenderness. Extremities:  No bone pain.  No joint pain or swelling. Skin:  No rashes or skin changes. Neuro:  No headache, numbness or weakness, balance or coordination issues. Endocrine:  No diabetes, thyroid issues, hot flashes or night sweats. Psych:  No mood changes, depression or anxiety. Pain:  No pain. Review of systems:  All other systems reviewed and found to be negative.   Physical Exam: Blood pressure (!) 142/86, pulse 61, temperature 98.6 F (37 C), temperature source Tympanic, resp. rate 18, weight 193 lb 2 oz (87.6 kg). GENERAL:  Well developed, well nourished, gentleman sitting comfortably in the exam room in no acute distress. MENTAL STATUS:  Alert and oriented to person, place and time. HEAD:  Pearline Cables hair.  Normocephalic, atraumatic, face symmetric, no Cushingoid features. EYES: Glasses. Blue eyes. Pupils equal round and reactive to light and accomodation.  No conjunctivitis or scleral icterus. ENT: Oropharynx clear without lesion. Tongue normal. Mucous membranes moist.  RESPIRATORY: Clear to auscultation without rales, wheezes or rhonchi. CARDIOVASCULAR: Regular rate and rhythm without murmur, rub or gallop. ABDOMEN: Soft, non-tender, with active bowel sounds, and no appreciable  hepatosplenomegaly.  No masses.  SKIN: No rashes, ulcers or lesions. EXTREMITIES: No edema, no skin discoloration or tenderness. No palpable cords. LYMPH NODES: No palpable cervical, supraclavicular, axillary or inguinal adenopathy  NEUROLOGICAL: Unremarkable. PSYCH: Appropriate.    Appointment on 10/14/2017  Component Date Value Ref Range Status  . Uric Acid, Serum 10/14/2017 4.8  4.4 - 7.6 mg/dL Final   Performed at [facility], [address], [place], [place] [address]  . LDH 10/14/2017 703* 98 - 192 U/L Final   Performed at [facility], [address] [place], [place] [address]  . Sodium 10/14/2017 136  135 - 145 mmol/L Final  . Potassium 10/14/2017 4.1  3.5 - 5.1 mmol/L Final  .  Chloride 10/14/2017 103  101 - 111 mmol/L Final  . CO2 10/14/2017 26  22 - 32 mmol/L Final  . Glucose, Bld 10/14/2017 95  65 - 99 mg/dL Final  . BUN 10/14/2017 26* 6 - 20 mg/dL Final  . Creatinine, Ser 10/14/2017 1.46* 0.61 - 1.24 mg/dL Final  . Calcium 10/14/2017 9.1  8.9 - 10.3 mg/dL Final  . Total Protein 10/14/2017 8.0  6.5 - 8.1 g/dL Final  . Albumin 10/14/2017 4.2  3.5 - 5.0 g/dL Final  . AST 10/14/2017 37  15 - 41 U/L Final  . ALT 10/14/2017 17  17 - 63 U/L Final  . Alkaline Phosphatase 10/14/2017 54  38 - 126 U/L Final  . Total Bilirubin 10/14/2017 0.6  0.3 - 1.2 mg/dL Final  . GFR calc non Af Amer 10/14/2017 47* >60 mL/min Final  . GFR calc Af Amer 10/14/2017 54* >60 mL/min Final   Comment: (NOTE) The eGFR has been calculated using the CKD EPI equation. This calculation has not been validated in all clinical situations. eGFR's persistently <60 mL/min signify possible Chronic Kidney Disease.   Georgiann Hahn gap 10/14/2017 7  5 - 15 Final   Performed at [facility], [address], [place], [place] [address]  . WBC 10/14/2017 16.4* 3.8 - 10.6 K/uL Final  . RBC 10/14/2017 4.61  4.40 - 5.90 MIL/uL Final  . Hemoglobin 10/14/2017 11.9* 13.0 - 18.0 g/dL  Final  . HCT 10/14/2017 36.4* 40.0 - 52.0 % Final  . MCV 10/14/2017 78.9* 80.0 - 100.0 fL Final  . MCH 10/14/2017 25.9* 26.0 - 34.0 pg Final  . MCHC 10/14/2017 32.8  32.0 - 36.0 g/dL Final  . RDW 10/14/2017 21.4* 11.5 - 14.5 % Final  . Platelets 10/14/2017 237  150 - 440 K/uL Final  . Neutrophils Relative % 10/14/2017 76  % Final  . Neutro Abs 10/14/2017 12.6* 1.4 - 6.5 K/uL Final  . Lymphocytes Relative 10/14/2017 9  % Final  . Lymphs Abs 10/14/2017 1.4  1.0 - 3.6 K/uL Final  . Monocytes Relative 10/14/2017 13  % Final  . Monocytes Absolute 10/14/2017 2.1* 0.2 - 1.0 K/uL Final  . Eosinophils Relative 10/14/2017 1  % Final  . Eosinophils Absolute 10/14/2017 0.1  0 - 0.7 K/uL Final  . Basophils Relative 10/14/2017 1  % Final  . Basophils Absolute 10/14/2017 0.1  0 - 0.1 K/uL Final   Performed at [facility], [address], [place], [place] [address]    Assessment:  Wilhelm Ganaway is a 72 y.o. 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  He has had polycythemia dating back to 2013.  Hematocrit was 62.1 with a hemoglobin of 19.8 on 03/28/2015.  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  Erythropoietin level was 1.1 (low).  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  He began a phlebotomy program on 03/28/2015 to maintain a hematocrit goal of < 45.  He last underwent phlebotomy on 05/02/2015.  He is on a baby aspirin.  He is followed by GI (Dr. Vira Agar) for a history of polyps and Crohn's disease.  Last colonoscopy was 3-4 years ago.  He has become iron deficient on his phlebotomy program (ferritin 21 on 11/28/2015).   CBC on 11/28/2015 revealed a hematocrit of 39.3, hemoglobin 11.9, platelets 463,000, WBC 32,000 with an ANC of 25,000.  Differential included 71% segs, 4% lymphocytes and 17% monocytes.  Peripheral smear revealed leukocytosis with predominantly mature neutrophils, increased monocytes and rare  blasts (<1%).  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  Marrow was packed (95-100% cellularity) with pan myelosis, multi-lineage dyspoiesis, and no significant increase in blasts.  There was moderate to focally marked reticulin fibrosis (grade 2-3/3).  Storage iron was not identified.  Flow cytometry revealed non-specific atypical myeloid findings with no increase in blasts.  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  Cytogenetics were normal (46, XY).  Bone marrow on 04/07/2016 at [facility] revealed a hypercellular marrow (> 95%) with persistent involvement by myeloid neoplasm with 5% blasts.  There was mild reticulin fibrosis.  FISH t(9;22) results were normal.  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  Cytogenetics are pending.  He was briefly on hydroxyurea 500 mg a day (12/05/2015 - 12/19/2015 and 06/25/2016 - 07/03/2016).   Platelet count increased to 1.18 million and white count to 54,800 without increased blasts on 06/25/2016.  CBC on 07/02/2016 revealed a platelets of 429,000 with a white count 30,600.  He began allopurinol on 12/19/2015.  He began Jakafi 20 mg BID on 05/28/2016.  Jakafi was increased to 25 mg BID on 05/26/2017.  LDH is followed:  466 on 01/02/2016, 596 on 05/23/2016, 663 on 07/30/2016, 522 on 09/24/2016, 638 on 01/05/2017, 625 on 02/25/2017, 1144 on 06/10/2017, 720 on 06/17/2017, and 728 on 06/24/2017.  Triglycerides were 183 (< 150) on 08/20/2016, 180 on 01/05/2017, and 312 on 06/17/2017.  He has urinary retention.  Renal ultrasound on 07/28/2016 revealed bilateral hydronephrosis (right > left) and a large postvoid residual (2 liters) suggesting bladder dysfunction or outlet obstruction.  He underwent temporary Foley catheter placement.  He performs I/O self catheterizations.  He is on Flomax  and finasteride.  PSA was 2.02 on 07/09/2016.  He has chronic renal insufficiency (Cr 1.85; CrCl 36 ml/min).  SPEP on 09/23/2016 was negative.  Spot urine revealed 17.4% of 27.3 mg/dL.of a monoclonal protein.  24 hour urine on 10/16/2016 revealed no monoclonal protein.  Renal function transiently decreased in 06/2017 after Bactrim.  He is followed by nephrology and urology.  Symptomatically, he feels good.  Exam is stable.  WBC is 16,400. Hemoglobin is 11.9, hematocrit 36.4, MCV 78.9, and platelets 237,000. Creatinine is 1.46 (CrCl 51.7 ml/min).  Plan: 1.  Labs today: CBC with diff, CMP, LDH, uric acid. 2.  Continue Jakafi 25 mg BID.  3.  Continue ferrous sulfate 325 mg on Mondays, Wednesday, Fridays. 4.  Discussed follow up with Dr. Caryn Section re: elevated triglycerides. Patient has not followed up with PCP.  5.  RTC in 6 weeks for fasting labs (CBC with diff, lipid panel). 6.  RTC in 3 months for MD assessment and labs (CBC with diff, CMP, LDH, uric acid).   Honor Loh, NP  10/14/2017 , 2:46 PM   I saw and evaluated the patient, participating in the key portions of the service and reviewing pertinent diagnostic studies and records.  I reviewed the nurse practitioner's note and agree with the findings and the plan.  The assessment and plan were discussed with the patient.  A few questions were asked by the patient and answered.   Lequita Asal, MD 10/14/2017 , 2:46 PM

## 2017-10-19 MED FILL — JAKAFI 25 MG TABLET: 25 | 30 days supply | Qty: 60 | Fill #5

## 2017-10-28 DIAGNOSIS — L0201 Cutaneous abscess of face: Secondary | ICD-10-CM | POA: Diagnosis not present

## 2017-10-30 DIAGNOSIS — N1330 Unspecified hydronephrosis: Secondary | ICD-10-CM | POA: Diagnosis not present

## 2017-10-30 DIAGNOSIS — N2581 Secondary hyperparathyroidism of renal origin: Secondary | ICD-10-CM | POA: Diagnosis not present

## 2017-10-30 DIAGNOSIS — N183 Chronic kidney disease, stage 3 (moderate): Secondary | ICD-10-CM | POA: Diagnosis not present

## 2017-10-30 DIAGNOSIS — E872 Acidosis: Secondary | ICD-10-CM | POA: Diagnosis not present

## 2017-11-02 DIAGNOSIS — H401131 Primary open-angle glaucoma, bilateral, mild stage: Secondary | ICD-10-CM | POA: Diagnosis not present

## 2017-11-10 ENCOUNTER — Other Ambulatory Visit: Payer: Self-pay | Admitting: Hematology and Oncology

## 2017-11-10 ENCOUNTER — Telehealth: Payer: Self-pay | Admitting: Pharmacist

## 2017-11-10 DIAGNOSIS — D45 Polycythemia vera: Secondary | ICD-10-CM

## 2017-11-10 NOTE — Telephone Encounter (Signed)
Oral Chemotherapy Pharmacist Encounter  Follow-Up Form  Called patient today to follow up regarding patient's oral chemotherapy medication: Jakafi  Original Start date of oral chemotherapy: 05/28/2016  Pt reports 0 tablets/doses of Jakafi missed in the last month.   Pt reports the following side effects: none reported  Recent labs reviewed: CBC from 10/14/17  New medications?: none reported  Other Issues: N/A  Patient knows to call the office with questions or concerns. Oral Oncology Clinic will continue to follow.  Darl Pikes, PharmD, BCPS Hematology/Oncology Clinical Pharmacist [facility] [phone]  11/10/2017 4:14 PM

## 2017-11-10 NOTE — Telephone Encounter (Signed)
Oral Chemotherapy Pharmacist Encounter   Attempted to reach patient for follow up on oral medication: Jakafi. No answer. Left VM for patient to call back.    Darl Pikes, PharmD, BCPS Hematology/Oncology Clinical Pharmacist [facility] [phone]  11/10/2017 3:49 PM

## 2017-11-18 MED FILL — JAKAFI 25 MG TABLET: 25 | 30 days supply | Qty: 60 | Fill #0

## 2017-11-25 ENCOUNTER — Inpatient Hospital Stay: Payer: Medicare Other | Attending: Hematology and Oncology

## 2017-11-25 DIAGNOSIS — E781 Pure hyperglyceridemia: Secondary | ICD-10-CM

## 2017-11-25 DIAGNOSIS — D45 Polycythemia vera: Secondary | ICD-10-CM | POA: Diagnosis not present

## 2017-11-25 LAB — CBC WITH DIFFERENTIAL/PLATELET
Basophils Absolute: 0.2 10*3/uL — ABNORMAL HIGH (ref 0–0.1)
Basophils Relative: 2 %
Eosinophils Absolute: 0.1 10*3/uL (ref 0–0.7)
Eosinophils Relative: 1 %
HCT: 36.1 % — ABNORMAL LOW (ref 40.0–52.0)
Hemoglobin: 11.9 g/dL — ABNORMAL LOW (ref 13.0–18.0)
Lymphocytes Relative: 6 %
Lymphs Abs: 1 10*3/uL (ref 1.0–3.6)
MCH: 26.3 pg (ref 26.0–34.0)
MCHC: 33.1 g/dL (ref 32.0–36.0)
MCV: 79.5 fL — ABNORMAL LOW (ref 80.0–100.0)
Monocytes Absolute: 1.5 10*3/uL — ABNORMAL HIGH (ref 0.2–1.0)
Monocytes Relative: 10 %
Neutro Abs: 12.3 10*3/uL — ABNORMAL HIGH (ref 1.4–6.5)
Neutrophils Relative %: 81 %
Platelets: 172 10*3/uL (ref 150–440)
RBC: 4.54 MIL/uL (ref 4.40–5.90)
RDW: 22 % — ABNORMAL HIGH (ref 11.5–14.5)
WBC: 15.1 10*3/uL — ABNORMAL HIGH (ref 3.8–10.6)

## 2017-11-25 LAB — LIPID PANEL
CHOL/HDL RATIO: 4.1 ratio
CHOLESTEROL: 153 mg/dL (ref 0–200)
HDL: 37 mg/dL — ABNORMAL LOW (ref >40)
LDL Cholesterol: 83 mg/dL (ref 0–99)
TRIGLYCERIDES: 165 mg/dL — AB (ref <150)
VLDL: 33 mg/dL (ref 0–40)

## 2017-12-07 DIAGNOSIS — Z85828 Personal history of other malignant neoplasm of skin: Secondary | ICD-10-CM | POA: Diagnosis not present

## 2017-12-07 DIAGNOSIS — L570 Actinic keratosis: Secondary | ICD-10-CM | POA: Diagnosis not present

## 2017-12-07 DIAGNOSIS — L821 Other seborrheic keratosis: Secondary | ICD-10-CM | POA: Diagnosis not present

## 2017-12-07 DIAGNOSIS — D225 Melanocytic nevi of trunk: Secondary | ICD-10-CM | POA: Diagnosis not present

## 2017-12-09 ENCOUNTER — Encounter: Payer: Self-pay | Admitting: Hematology and Oncology

## 2017-12-16 MED FILL — JAKAFI 25 MG TABLET: 25 | 30 days supply | Qty: 60 | Fill #1

## 2018-01-04 ENCOUNTER — Ambulatory Visit: Admit: 2018-01-04 | Discharge: 2018-01-05 | Payer: MEDICARE | Attending: Adult Health | Primary: Adult Health

## 2018-01-04 ENCOUNTER — Other Ambulatory Visit: Admit: 2018-01-04 | Discharge: 2018-01-05 | Payer: MEDICARE

## 2018-01-04 DIAGNOSIS — D469 Myelodysplastic syndrome, unspecified: Secondary | ICD-10-CM | POA: Diagnosis not present

## 2018-01-04 DIAGNOSIS — Z79899 Other long term (current) drug therapy: Secondary | ICD-10-CM | POA: Diagnosis not present

## 2018-01-04 DIAGNOSIS — N183 Chronic kidney disease, stage 3 (moderate): Secondary | ICD-10-CM | POA: Diagnosis not present

## 2018-01-04 DIAGNOSIS — D45 Polycythemia vera: Secondary | ICD-10-CM | POA: Diagnosis not present

## 2018-01-04 DIAGNOSIS — D509 Iron deficiency anemia, unspecified: Secondary | ICD-10-CM | POA: Diagnosis not present

## 2018-01-04 DIAGNOSIS — K5090 Crohn's disease, unspecified, without complications: Secondary | ICD-10-CM | POA: Diagnosis not present

## 2018-01-04 DIAGNOSIS — D471 Chronic myeloproliferative disease: Secondary | ICD-10-CM | POA: Diagnosis not present

## 2018-01-04 DIAGNOSIS — Z87891 Personal history of nicotine dependence: Secondary | ICD-10-CM | POA: Diagnosis not present

## 2018-01-04 DIAGNOSIS — E663 Overweight: Secondary | ICD-10-CM | POA: Diagnosis not present

## 2018-01-06 ENCOUNTER — Inpatient Hospital Stay: Payer: Medicare Other

## 2018-01-06 ENCOUNTER — Encounter (INDEPENDENT_AMBULATORY_CARE_PROVIDER_SITE_OTHER): Payer: Self-pay

## 2018-01-06 ENCOUNTER — Inpatient Hospital Stay: Payer: Medicare Other | Attending: Hematology and Oncology | Admitting: Hematology and Oncology

## 2018-01-06 ENCOUNTER — Encounter: Payer: Self-pay | Admitting: Hematology and Oncology

## 2018-01-06 VITALS — BP 117/73 | HR 68 | Temp 97.6°F | Resp 18 | Wt 186.5 lb

## 2018-01-06 DIAGNOSIS — K5090 Crohn's disease, unspecified, without complications: Secondary | ICD-10-CM

## 2018-01-06 DIAGNOSIS — Z79899 Other long term (current) drug therapy: Secondary | ICD-10-CM | POA: Diagnosis not present

## 2018-01-06 DIAGNOSIS — Z7982 Long term (current) use of aspirin: Secondary | ICD-10-CM | POA: Diagnosis not present

## 2018-01-06 DIAGNOSIS — D45 Polycythemia vera: Secondary | ICD-10-CM | POA: Diagnosis not present

## 2018-01-06 DIAGNOSIS — Z87891 Personal history of nicotine dependence: Secondary | ICD-10-CM

## 2018-01-06 DIAGNOSIS — Z148 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: Secondary | ICD-10-CM | POA: Diagnosis not present

## 2018-01-06 DIAGNOSIS — N1330 Unspecified hydronephrosis: Secondary | ICD-10-CM | POA: Diagnosis not present

## 2018-01-06 DIAGNOSIS — Z7189 Other specified counseling: Secondary | ICD-10-CM | POA: Insufficient documentation

## 2018-01-06 DIAGNOSIS — D7581 Myelofibrosis: Secondary | ICD-10-CM

## 2018-01-06 DIAGNOSIS — N189 Chronic kidney disease, unspecified: Secondary | ICD-10-CM

## 2018-01-06 DIAGNOSIS — N400 Enlarged prostate without lower urinary tract symptoms: Secondary | ICD-10-CM

## 2018-01-06 DIAGNOSIS — E781 Pure hyperglyceridemia: Secondary | ICD-10-CM

## 2018-01-06 LAB — URIC ACID: Uric Acid, Serum: 5.4 mg/dL (ref 4.4–7.6)

## 2018-01-06 LAB — LACTATE DEHYDROGENASE: LDH: 724 U/L — ABNORMAL HIGH (ref 98–192)

## 2018-01-06 NOTE — Progress Notes (Signed)
Patient offers no complaints today. 

## 2018-01-06 NOTE — Progress Notes (Signed)
[facility] day:  01/06/2018  Chief Complaint: Ruben [name] is a 72 y.o. 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  HPI: The patient was last seen in the medical oncology clinic on 10/14/2017.  At that time, he felt good.  Exam was stable.  WBC was 16,400. Hemoglobin was 11.9, hematocrit 36.4, MCV 78.9, and platelets 237,000. Creatinine was 1.46 (CrCl 51.7 ml/min).  He continued Jakafi 25 mg BID.  CBC on 11/25/2017 revealed a hematocrit of 36.1, hemoglobin 11.9, MCV 79.5, platelets 172,000, WBC 15,100 with an ANC of 12,300.  Cholesterol was 153.  Triglycerides were 165.  HDL 37 and LDL 83.  Labs done at [facility] on 01/04/2018. WBC was 17,300. Hemoglobin 12.4, hematocrit 38, MCV 84.2, and platelets 233,000. BUN 23 and creatinine 1.50. Potassium was 5.3. Total protein was 8.5  During the interim, patient has been doing well. Patient has had some concerns with regards the skin on his face. He used Efudex (5FU) cream, prescribed by Dr. Evorn Gong, that "made his face fall off". Patient notes that his face turned "red and brown".   Patient denies B symptoms. He has not experienced any recent infections. Patient denies chest pain and shortness of breath.   Patient is eating well. His weight is down 7 pounds. Patient notes that he is not as active as he is normally.    Past Medical History:  Diagnosis Date  . BPH (benign prostatic hyperplasia)   . Crohn disease ([place])   . Crohn's disease ([place])   . Glaucoma   . Glaucoma   . Gout   . History of chicken pox   . RBBB   . Right bundle branch block     Past Surgical History:  Procedure Laterality Date  . COLONOSCOPY WITH PROPOFOL N/A 03/27/2017   Procedure: COLONOSCOPY WITH PROPOFOL;  Surgeon: [name], MD;  Location: [facility] ENDOSCOPY;  Service: Endoscopy;  Laterality: N/A;  . PROSTATE SURGERY  2008   Prostate Biopsy in [place] to elevated PSA.  per patient  normal  . Skin Lesion Basal cell removed    . wart removal     from eyelid    Family History  Problem Relation Age of Onset  . Cancer Mother        Liver  . Cancer Father        Prostate  . Cancer Sister        Hodgkins lymphoma  . Cancer Paternal Aunt        Breast    Social History:  reports that he quit smoking about 14 years ago. His smoking use included cigarettes. He has a 5.00 pack-year smoking history. He has never used smokeless tobacco. He reports that he drinks about 0.6 oz of alcohol per week. He reports that he does not use drugs.  He previously worked for [employer].  He volunteers for [employer].  The patient is accompanied by his wife, Ruben [name], today.  Allergies: No Known Allergies  Current Medications: Current Outpatient Medications  Medication Sig Dispense Refill  . allopurinol (ZYLOPRIM) 300 MG tablet Take 1 tablet (300 mg total) by mouth daily. 90 tablet 0  . aspirin 81 MG tablet Take 81 mg by mouth daily.    . Cholecalciferol (VITAMIN D3) 1000 units CAPS Take 1 capsule by mouth daily.    . Cyanocobalamin (RA VITAMIN B-12 TR) 1000 MCG TBCR     . ferrous sulfate  325 (65 FE) MG tablet Take 325 mg by mouth 3 (three) times a week.    . finasteride (PROSCAR) 5 MG tablet Take 1 tablet (5 mg total) by mouth daily. 90 tablet 3  . folic acid (FOLVITE) 1 MG tablet TAKE 1 TABLET (1,000 MCG TOTAL) BY MOUTH ONCE DAILY. MUST KEEP APPT FOR FURTHER REFILLS.    Marland Kitchen JAKAFI 25 MG tablet TAKE 1 TABLET (25 MG TOTAL) BY MOUTH 2 (TWO) TIMES DAILY. 60 tablet 5  . latanoprost (XALATAN) 0.005 % ophthalmic solution Apply 1 drop to eye daily.     . Multiple Vitamins-Minerals (CENTRUM SILVER ULTRA MENS) TABS Take by mouth. Reported on 01/30/2016    . Omega-3 Fatty Acids (KP FISH OIL) 1200 MG CAPS Take by mouth.    . sodium bicarbonate 650 MG tablet Take 650 mg by mouth 2 (two) times daily.    . timolol (BETIMOL) 0.25 % ophthalmic solution Place 1 drop into both eyes daily.       No current facility-administered medications for this visit.     Review of Systems:  GENERAL:  Feels good.  Less active.  No fevers, sweats or weight loss.  Weight down 7 pounds. PERFORMANCE STATUS (ECOG):  1 HEENT:  No visual changes, runny [name], sore throat, mouth sores or tenderness. Lungs: No shortness of breath or cough.  No hemoptysis. Cardiac:  No chest pain, palpitations, orthopnea, or PND. GI:  Chronic diarrhea.  No nausea, vomiting, constipation, melena or hematochezia. GU:  Self caths 4x/day.  No urgency, frequency, dysuria, or hematuria. Musculoskeletal:  No back pain.  No joint pain.  No muscle tenderness. Extremities:  No pain or swelling. Skin:  Facial irritation due to Efudex use (see HPI).  No other rashes or skin changes. Neuro:  No headache, numbness or weakness, balance or coordination issues. Endocrine:  No diabetes, thyroid issues, hot flashes or night sweats. Psych:  No mood changes, depression or anxiety. Pain:  No focal pain. Review of systems:  All other systems reviewed and found to be negative.   Physical Exam: Blood pressure 117/73, pulse 68, temperature 97.6 F (36.4 C), temperature source Tympanic, resp. rate 18, weight 186 lb 8.2 oz (84.6 kg), SpO2 100 %. GENERAL:  Well developed, well nourished, gentleman sitting comfortably in the exam room in no acute distress. MENTAL STATUS:  Alert and oriented to person, place and time. HEAD:  Pearline Cables hair.  Normocephalic, atraumatic, face symmetric, no Cushingoid features. EYES:  Glasses.  Blue eyes.  Pupils equal round and reactive to light and accomodation.  No conjunctivitis or scleral icterus. ENT:  Oropharynx clear without lesion.  Tongue normal. Mucous membranes moist.  RESPIRATORY:  Clear to auscultation without rales, wheezes or rhonchi. CARDIOVASCULAR:  Regular rate and rhythm without murmur, rub or gallop. ABDOMEN:  Soft, non-tender, with active bowel sounds, and no hepatosplenomegaly.  No masses. SKIN:   No rashes, ulcers or lesions. EXTREMITIES: No edema, no skin discoloration or tenderness.  No palpable cords. LYMPH NODES: No palpable cervical, supraclavicular, axillary or inguinal adenopathy  NEUROLOGICAL: Unremarkable. PSYCH:  Appropriate.    Appointment on 01/06/2018  Component Date Value Ref Range Status  . Uric Acid, Serum 01/06/2018 5.4  4.4 - 7.6 mg/dL Final   Performed at [facility], [address], [place], [place] [address]  . LDH 01/06/2018 724* 98 - 192 U/L Final   Performed at [facility], [address], [place],  [address]    Assessment:  Stony Stegmann  Mecham is a 72 y.o. 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  He has had polycythemia dating back to 2013.  Hematocrit was 62.1 with a hemoglobin of 19.8 on 03/28/2015.  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  Erythropoietin level was 1.1 (low).  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  He began a phlebotomy program on 03/28/2015 to maintain a hematocrit goal of < 45.  He last underwent phlebotomy on 05/02/2015.  He is on a baby aspirin.  He is followed by GI (Dr. Vira Agar) for a history of polyps and Crohn's disease.  Last colonoscopy was 3-4 years ago.  He has become iron deficient on his phlebotomy program (ferritin 21 on 11/28/2015).   CBC on 11/28/2015 revealed a hematocrit of 39.3, hemoglobin 11.9, platelets 463,000, WBC 32,000 with an ANC of 25,000.  Differential included 71% segs, 4% lymphocytes and 17% monocytes.  Peripheral smear revealed leukocytosis with predominantly mature neutrophils, increased monocytes and rare blasts (<1%).  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  Marrow was packed (95-100% cellularity) with pan myelosis, multi-lineage dyspoiesis, and no significant increase in blasts.  There was moderate to focally marked reticulin fibrosis (grade  2-3/3).  Storage iron was not identified.  Flow cytometry revealed non-specific atypical myeloid findings with no increase in blasts.  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  Cytogenetics were normal (46, XY).  B[facility] marrow on 04/07/2016 at [facility] revealed a hypercellular marrow (> 95%) with persistent involvement by myeloid neoplasm with 5% blasts.  There was mild reticulin fibrosis.  FISH t(9;22) results were normal.  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  Cytogenetics are pending.  He was briefly on hydroxyurea 500 mg a day (12/05/2015 - 12/19/2015 and 06/25/2016 - 07/03/2016).   Platelet count increased to 1.18 million and white count to 54,800 without increased blasts on 06/25/2016.  CBC on 07/02/2016 revealed a platelets of 429,000 with a white count 30,600.  He began allopurinol on 12/19/2015.  He began Jakafi 20 mg BID on 05/28/2016.  Jakafi was increased to 25 mg BID on 05/26/2017.  LDH is followed:  466 on 01/02/2016, 596 on 05/23/2016, 663 on 07/30/2016, 522 on 09/24/2016, 638 on 01/05/2017, 625 on 02/25/2017, 1144 on 06/10/2017, 720 on 06/17/2017, and 728 on 06/24/2017.  Triglycerides were 183 (< 150) on 08/20/2016, 180 on 01/05/2017, 312 on 06/17/2017, and 165 on 11/25/2017.  He has urinary retention.  Renal ultrasound on 07/28/2016 revealed bilateral hydronephrosis (right > left) and a large postvoid residual (2 liters) suggesting bladder dysfunction or outlet obstruction.  He underwent temporary Foley catheter placement.  He performs I/O self catheterizations.  He is on Flomax and finasteride.  PSA was 2.02 on 07/09/2016.  He has chronic renal insufficiency (Cr 1.85; CrCl 36 ml/min).  SPEP on 09/23/2016 was negative.  Spot urine revealed 17.4% of 27.3 mg/dL.of a monoclonal protein.  24 hour urine on 10/16/2016 revealed no monoclonal protein.  Renal function  transiently decreased in 06/2017 after Bactrim.  He is followed by nephrology and urology.  Symptomatically, he feels well. He denies any chest pain or shortness of breath. Exam is stable. Labs from [facility] done on 01/04/2018 were reviewed.     Plan: 1.  Labs today: CBC with diff, CMP, LDH, uric acid. 2.  Discuss platelet count. Platelets stable at 233,000. Continue Jakafi 25 mg BID.  3.  Continue ferrous sulfate 325 mg on Mondays, Wednesday, Fridays. 4.  RTC in 6 weeks for fasting labs (CBC with diff, lipid panel). 5.  RTC in 3 months for MD assessment and labs (CBC with diff, CMP, LDH, uric acid).   Honor Loh, NP  01/06/2018 , 3:27 PM   I saw and evaluated the patient, participating in the key portions of the service and reviewing pertinent diagnostic studies and records.  I reviewed the nurse practitioner's note and agree with the findings and the plan.  The assessment and plan were discussed with the patient.  A few questions were asked by the patient and answered.   Lequita Asal, MD 01/06/2018, 3:27 PM

## 2018-01-18 MED FILL — JAKAFI 25 MG TABLET: 25 | 30 days supply | Qty: 60 | Fill #2

## 2018-01-23 ENCOUNTER — Encounter: Payer: Self-pay | Admitting: Urgent Care

## 2018-01-23 ENCOUNTER — Other Ambulatory Visit: Payer: Self-pay | Admitting: Urgent Care

## 2018-02-01 DIAGNOSIS — K5090 Crohn's disease, unspecified, without complications: Secondary | ICD-10-CM | POA: Diagnosis not present

## 2018-02-02 ENCOUNTER — Telehealth: Payer: Self-pay | Admitting: Pharmacist

## 2018-02-02 NOTE — Telephone Encounter (Signed)
Oral Chemotherapy Pharmacist Encounter  Follow-Up Form  Called patient's wife [name] today to follow up regarding patient's oral chemotherapy medication: Shanon Brow  Original Start date of oral chemotherapy: 05/2016  Pt's wife reports 0 tablets/doses of Jakafi missed in the last month.   Pt's wife reports the following side effects: None reported  Recent labs reviewed: CBC from 11/25/17   New medications?: None reported  Other Issues: none, patient is doing great he and his wife have an upcoming trip to celebrate their 40th wedding anniversary!   Patient knows to call the office with questions or concerns. Oral Oncology Clinic will continue to follow.  Darl Pikes, PharmD, BCPS Hematology/Oncology Clinical Pharmacist [facility] [place] [facility] [phone]  02/02/2018 2:22 PM

## 2018-02-15 MED FILL — JAKAFI 25 MG TABLET: 25 | 30 days supply | Qty: 60 | Fill #3

## 2018-02-17 ENCOUNTER — Telehealth: Payer: Self-pay | Admitting: *Deleted

## 2018-02-17 ENCOUNTER — Inpatient Hospital Stay: Payer: Medicare Other | Attending: Hematology and Oncology

## 2018-02-17 DIAGNOSIS — E785 Hyperlipidemia, unspecified: Secondary | ICD-10-CM | POA: Insufficient documentation

## 2018-02-17 DIAGNOSIS — D45 Polycythemia vera: Secondary | ICD-10-CM | POA: Insufficient documentation

## 2018-02-17 LAB — CBC WITH DIFFERENTIAL/PLATELET
Basophils Absolute: 0.2 10*3/uL — ABNORMAL HIGH (ref 0–0.1)
Basophils Relative: 1 %
Eosinophils Absolute: 0.2 10*3/uL (ref 0–0.7)
Eosinophils Relative: 1 %
HCT: 34.9 % — ABNORMAL LOW (ref 40.0–52.0)
Hemoglobin: 11.4 g/dL — ABNORMAL LOW (ref 13.0–18.0)
Lymphocytes Relative: 8 %
Lymphs Abs: 1.3 10*3/uL (ref 1.0–3.6)
MCH: 26.8 pg (ref 26.0–34.0)
MCHC: 32.6 g/dL (ref 32.0–36.0)
MCV: 82.2 fL (ref 80.0–100.0)
Monocytes Absolute: 2.3 10*3/uL — ABNORMAL HIGH (ref 0.2–1.0)
Monocytes Relative: 14 %
Neutro Abs: 12.4 10*3/uL — ABNORMAL HIGH (ref 1.4–6.5)
Neutrophils Relative %: 76 %
Platelets: 185 10*3/uL (ref 150–440)
RBC: 4.24 MIL/uL — ABNORMAL LOW (ref 4.40–5.90)
RDW: 21.1 % — ABNORMAL HIGH (ref 11.5–14.5)
WBC: 16.4 10*3/uL — ABNORMAL HIGH (ref 3.8–10.6)

## 2018-02-17 LAB — LIPID PANEL
Cholesterol: 144 mg/dL (ref 0–200)
HDL: 31 mg/dL — ABNORMAL LOW (ref >40)
LDL Cholesterol: 90 mg/dL (ref 0–99)
Total CHOL/HDL Ratio: 4.6 RATIO
Triglycerides: 113 mg/dL (ref <150)
VLDL: 23 mg/dL (ref 0–40)

## 2018-02-17 NOTE — Telephone Encounter (Signed)
Called patient and LVM to let him know his labs are good.

## 2018-02-17 NOTE — Telephone Encounter (Signed)
-----   Message from Lequita Asal, MD sent at 02/17/2018  4:08 PM EDT ----- Regarding: Please call patient with counts   ----- Message ----- From: Interface, Lab In [facility] Sent: 02/17/2018   8:28 AM To: Lequita Asal, MD

## 2018-02-19 NOTE — Progress Notes (Signed)
Patient: Ruben [name] Male    DOB: Feb 07, 1946   71 y.o.   MRN: [id number] Visit Date: 02/22/2018  Today's Provider: Lelon Huh, MD   Chief Complaint  Patient presents with  . Follow-up   Subjective:   Patient saw McKenzie for AWV today at 9:15 am.  HPI  He states he feels well with no complaints. He has gout years ago due to hyperuricemia, but none since starting allopurinol which is he tolerating well.  Lab Results  Component Value Date   LABURIC 5.4 01/06/2018     Elevated prostate specific antigen (PSA) From 02/20/2017-no changes. Followed by Dr. Erlene Quan.  Polycythemia vera ([place]) From 02/20/2017-Stable. Continue follows up Dr. Gerrit Friends.    Renal insufficiency From 02/20/2017-no changes made, is still following up with Dr. Pierce Crane.     Lelon Huh, MD  [facility]   No Known Allergies   Current Outpatient Medications:  .  allopurinol (ZYLOPRIM) 300 MG tablet, TAKE 1 TABLET BY MOUTH EVERY DAY, Disp: 90 tablet, Rfl: 0 .  aspirin 81 MG tablet, Take 81 mg by mouth daily., Disp: , Rfl:  .  Cholecalciferol (VITAMIN D3) 1000 units CAPS, Take 1 capsule by mouth daily., Disp: , Rfl:  .  Cyanocobalamin (RA VITAMIN B-12 TR) 1000 MCG TBCR, daily. , Disp: , Rfl:  .  ferrous sulfate 325 (65 FE) MG tablet, Take 325 mg by mouth 3 (three) times a week., Disp: , Rfl:  .  finasteride (PROSCAR) 5 MG tablet, Take 1 tablet (5 mg total) by mouth daily., Disp: 90 tablet, Rfl: 3 .  folic acid (FOLVITE) 1 MG tablet, TAKE 1 TABLET (1,000 MCG TOTAL) BY MOUTH ONCE DAILY. MUST KEEP APPT FOR FURTHER REFILLS., Disp: , Rfl:  .  JAKAFI 25 MG tablet, TAKE 1 TABLET (25 MG TOTAL) BY MOUTH 2 (TWO) TIMES DAILY. (Patient taking differently: TAKE 2 TABLET (50 MG TOTAL) BY MOUTH DAILY.), Disp: 60 tablet, Rfl: 5 .  latanoprost (XALATAN) 0.005 % ophthalmic solution, Place 1 drop into both eyes daily. , Disp: , Rfl:  .  Multiple Vitamins-Minerals (CENTRUM SILVER ULTRA  MENS) TABS, Take by mouth. Reported on 01/30/2016, Disp: , Rfl:  .  Omega-3 Fatty Acids (KP FISH OIL) 1200 MG CAPS, Take by mouth daily. , Disp: , Rfl:  .  sodium bicarbonate 650 MG tablet, Take 650 mg by mouth 2 (two) times daily., Disp: , Rfl:  .  timolol (BETIMOL) 0.25 % ophthalmic solution, Place 1 drop into both eyes daily. , Disp: , Rfl:   Review of Systems  Genitourinary: Positive for difficulty urinating.  All other systems reviewed and are negative.   Social History   Tobacco Use  . Smoking status: Former Smoker    Packs/day: 0.25    Years: 20.00    Pack years: 5.00    Types: Cigarettes    Last attempt to quit: 09/28/2003    Years since quitting: 14.4  . Smokeless tobacco: Never Used  Substance Use Topics  . Alcohol use: Yes    Alcohol/week: 2.4 - 3.0 oz    Types: 4 - 5 Glasses of wine per week   Objective:   Vitals:   BP 120/66 (BP Location: Right Arm)   Pulse 69   Temp 99 F (37.2 C) (Oral)   Ht 6' (1.829 m)   Wt 186 lb 12.8 oz (84.7 kg)   BMI 25.33 kg/m   BSA 2.07 m      Physical Exam  General Appearance:    Alert, cooperative, no distress  Eyes:    PERRL, conjunctiva/corneas clear, EOM's intact       Lungs:     Clear to auscultation bilaterally, respirations unlabored  Heart:    Regular rate and rhythm  Neurologic:   Awake, alert, oriented x 3. No apparent focal neurological           defect.           Assessment & Plan:     1. Renal insufficiency Stable, continue follow up with Dr. Holley Raring.   2. Secondary polycythemia Continue follow up with Dr. Gildardo Pounds.   3. Benign prostatic hyperplasia with lower urinary tract symptoms, symptom details unspecified Continue follow up with Dr. Erlene Quan.   4. Hyperuricemia Controlled, no gout flares since starting allopurinol which is continued today.        Lelon Huh, MD  [facility]

## 2018-02-22 ENCOUNTER — Ambulatory Visit (INDEPENDENT_AMBULATORY_CARE_PROVIDER_SITE_OTHER): Payer: Medicare Other | Admitting: Family Medicine

## 2018-02-22 ENCOUNTER — Encounter: Payer: Self-pay | Admitting: Family Medicine

## 2018-02-22 ENCOUNTER — Ambulatory Visit (INDEPENDENT_AMBULATORY_CARE_PROVIDER_SITE_OTHER): Payer: Medicare Other

## 2018-02-22 VITALS — BP 120/66 | HR 69 | Temp 99.0°F | Ht 72.0 in | Wt 186.8 lb

## 2018-02-22 DIAGNOSIS — N401 Enlarged prostate with lower urinary tract symptoms: Secondary | ICD-10-CM | POA: Diagnosis not present

## 2018-02-22 DIAGNOSIS — Z23 Encounter for immunization: Secondary | ICD-10-CM

## 2018-02-22 DIAGNOSIS — Z0000 Encounter for general adult medical examination without abnormal findings: Secondary | ICD-10-CM | POA: Diagnosis not present

## 2018-02-22 DIAGNOSIS — D751 Secondary polycythemia: Secondary | ICD-10-CM

## 2018-02-22 DIAGNOSIS — N289 Disorder of kidney and ureter, unspecified: Secondary | ICD-10-CM

## 2018-02-22 DIAGNOSIS — E790 Hyperuricemia without signs of inflammatory arthritis and tophaceous disease: Secondary | ICD-10-CM

## 2018-02-22 NOTE — Progress Notes (Signed)
Subjective:   Zakar Brosch is a 72 y.o. male who presents for Medicare Annual/Subsequent preventive examination.  Review of Systems:  N/A  Cardiac Risk Factors include: advanced age (>32mn, >>92women)     Objective:    Vitals: BP 120/66 (BP Location: Right Arm)   Pulse 69   Temp 99 F (37.2 C) (Oral)   Ht 6' (1.829 m)   Wt 186 lb 12.8 oz (84.7 kg)   BMI 25.33 kg/m   Body mass index is 25.33 kg/m.  Advanced Directives 02/22/2018 01/06/2018 10/14/2017 07/15/2017 06/10/2017 02/25/2017 02/20/2017  Does Patient Have a Medical Advance Directive? Yes Yes Yes Yes Yes Yes Yes  Type of [place]amed[place]of [place] will - - - - - Living will;Healthcare Power of Attorney  Does patient want to make changes to medical advance directive? - - - - - - -  Copy of [place]? Yes - - - - - Yes    Tobacco Social History   Tobacco Use  Smoking Status Former Smoker  . Packs/day: 0.25  . Years: 20.00  . Pack years: 5.00  . Types: Cigarettes  . Last attempt to quit: 09/28/2003  . Years since quitting: 14.4  Smokeless Tobacco Never Used     Counseling given: Not Answered   Clinical Intake:  Pre-visit preparation completed: Yes  Pain : No/denies pain Pain Score: 0-No pain     Nutritional Status: BMI 25 -29 Overweight Diabetes: No  How often do you need to have someone help you when you read instructions, pamphlets, or other written materials from your doctor or pharmacy?: 1 - Never  Interpreter Needed?: No  Information [facility] :: [facility]  Past Medical History:  Diagnosis Date  . BPH (benign prostatic hyperplasia)   . Crohn disease ([place]   . Crohn's disease ([place]   . Glaucoma   . Glaucoma   . Gout   . History of chicken pox   . RBBB   . Right bundle branch block    Past Surgical History:  Procedure Laterality Date  . COLONOSCOPY WITH PROPOFOL N/A 03/27/2017   Procedure: COLONOSCOPY WITH PROPOFOL;   Surgeon: EManya Silvas MD;  [facility]  Service: Endoscopy;  Laterality: N/A;  . PROSTATE SURGERY  2008   Prostate Biopsy in [place] to elevated PSA.  per patient normal  . Skin Lesion Basal cell removed    . wart removal     from eyelid   Family History  Problem Relation Age of Onset  . Cancer Mother        Liver  . Cancer Father        Prostate  . Cancer Sister        Hodgkins lymphoma  . Cancer Paternal Aunt        Breast   Social History   Socioeconomic History  . Marital status: Married    Spouse name: Not on file  . Number of children: 2  . Years of education: Not on file  . Highest education level: Bachelor's degree (e.g., BA, AB, BS)  Occupational History  . Occupation: Retired  SScientific laboratory technician . Financial resource strain: Not hard at all  . Food insecurity:    Worry: Never true    Inability: Never true  . Transportation needs:    Medical: No    Non-medical: No  Tobacco Use  . Smoking status: Former Smoker    Packs/day: 0.25  Years: 20.00    Pack years: 5.00    Types: Cigarettes    Last attempt to quit: 09/28/2003    Years since quitting: 14.4  . Smokeless tobacco: Never Used  Substance and Sexual Activity  . Alcohol use: Yes    Alcohol/week: 2.4 - 3.0 oz    Types: 4 - 5 Glasses of wine per week  . Drug use: No  . Sexual activity: Not on file  Lifestyle  . Physical activity:    Days per week: Not on file    Minutes per session: Not on file  . Stress: Not at all  Relationships  . Social connections:    Talks on phone: Not on file    Gets together: Not on file    Attends religious service: Not on file    Active member of club or organization: Not on file    Attends meetings of clubs or organizations: Not on file    Relationship status: Not on file  Other Topics Concern  . Not on file  Social History Narrative  . Not on file    Outpatient Encounter Medications as of 02/22/2018  Medication Sig  . allopurinol (ZYLOPRIM)  300 MG tablet TAKE 1 TABLET BY MOUTH EVERY DAY  . aspirin 81 MG tablet Take 81 mg by mouth daily.  . Cholecalciferol (VITAMIN D3) 1000 units CAPS Take 1 capsule by mouth daily.  . Cyanocobalamin (RA VITAMIN B-12 TR) 1000 MCG TBCR daily.   . ferrous sulfate 325 (65 FE) MG tablet Take 325 mg by mouth 3 (three) times a week.  . finasteride (PROSCAR) 5 MG tablet Take 1 tablet (5 mg total) by mouth daily.  . folic acid (FOLVITE) 1 MG tablet TAKE 1 TABLET (1,000 MCG TOTAL) BY MOUTH ONCE DAILY. MUST KEEP APPT FOR FURTHER REFILLS.  Marland Kitchen JAKAFI 25 MG tablet TAKE 1 TABLET (25 MG TOTAL) BY MOUTH 2 (TWO) TIMES DAILY. (Patient taking differently: TAKE 2 TABLET (50 MG TOTAL) BY MOUTH DAILY.)  . latanoprost (XALATAN) 0.005 % ophthalmic solution Place 1 drop into both eyes daily.   . Multiple Vitamins-Minerals (CENTRUM SILVER ULTRA MENS) TABS Take by mouth. Reported on 01/30/2016  . Omega-3 Fatty Acids (KP FISH OIL) 1200 MG CAPS Take by mouth daily.   . sodium bicarbonate 650 MG tablet Take 650 mg by mouth 2 (two) times daily.  . timolol (BETIMOL) 0.25 % ophthalmic solution Place 1 drop into both eyes daily.    No facility-administered encounter medications on file as of 02/22/2018.     Activities of Daily Living In your present state of health, do you have any difficulty performing the following activities: 02/22/2018  Hearing? N  Vision? N  Difficulty concentrating or making decisions? N  Walking or climbing stairs? N  Dressing or bathing? N  Doing errands, shopping? N  Preparing Food and eating ? N  Using the Toilet? N  In the past six months, have you accidently leaked urine? Y  Comment Only when not using a catheter.  Do you have problems with loss of bowel control? N  Managing your Medications? N  Managing your Finances? N  Housekeeping or managing your Housekeeping? N  Some recent data might be hidden    Patient Care Team: [name], MD as PCP - General (Family Medicine) [name], MD (Gastroenterology) Estill Cotta, MD (Ophthalmology) Lequita Asal, MD as Referring Physician (Hematology and Oncology) Dasher, Rayvon Char, MD as Referring Physician (Dermatology) Anthonette Legato, MD as  Consulting Physician (Internal Medicine) Hollice Espy, MD as Consulting Physician (Urology)   Assessment:   This is a routine wellness examination for Jahaan.  Exercise Activities and Dietary recommendations Current Exercise Habits: The patient does not participate in regular exercise at present, Exercise limited by: None identified  Goals    . DIET - INCREASE WATER INTAKE     Recommend to increase water intake to 4-6 glasses a day.        Fall Risk Fall Risk  02/22/2018 04/13/2017 02/20/2017 08/27/2015  Falls in the past year? No Yes Yes No  Comment - Emmi Telephone Survey: data to providers prior to load - -  Number falls in past yr: - 1 1 -  Comment - Emmi Telephone Survey Actual Response = 1 - -  Injury with Fall? - Yes No -  Follow up - - Falls prevention discussed -   Is the patient's home free of loose throw rugs in walkways, pet beds, electrical cords, etc?   yes      Grab bars in the bathroom? no      Handrails on the stairs?   yes      Adequate lighting?   yes  Timed Get Up and Go Performed: N/A  Depression Screen PHQ 2/9 Scores 02/22/2018 02/20/2017 02/20/2017 08/27/2015  PHQ - 2 Score 0 0 0 0  PHQ- 9 Score - 0 - -    Cognitive Function: Pt declined screening today.      6CIT Screen 02/20/2017  What Year? 0 points  What month? 0 points  What time? 0 points  Count back from 20 0 points  Months in reverse 0 points  Repeat phrase 2 points  Total Score 2    Immunization History  Administered Date(s) Administered  . Influenza,inj,Quad PF,6+ Mos 07/06/2017  . Pneumococcal Conjugate-13 04/10/2014  . Pneumococcal Polysaccharide-23 05/03/2012  . Td 02/11/2002  . Zoster 07/10/2014    Qualifies for Shingles Vaccine? Due for Shingles vaccine. Declined my  offer to administer today. Education has been provided regarding the importance of this vaccine. Pt has been advised to call her insurance company to determine her out of pocket expense. Advised she may also receive this vaccine at her local pharmacy or Health Dept. Verbalized acceptance and understanding.  Screening Tests Health Maintenance  Topic Date Due  . INFLUENZA VACCINE  04/22/2018  . TETANUS/TDAP  09/22/2020  . COLONOSCOPY  03/28/2027  . Hepatitis C Screening  Completed  . PNA vac Low Risk Adult  Completed   Cancer Screenings: Lung: Low Dose CT Chest recommended if Age 12-80 years, 30 pack-year currently smoking OR have quit w/in 15years. Patient does qualify, however declines today. Colorectal: Up to date  Additional Screenings:  Hepatitis C Screening: Up to date     Plan:  I have personally reviewed and addressed the Medicare Annual Wellness questionnaire and have noted the following in the patient's chart:  A. Medical and social history B. Use of alcohol, tobacco or illicit drugs  C. Current medications and supplements D. Functional ability and status E.  Nutritional status F.  Physical activity G. Advance directives H. List of other physicians I.  Hospitalizations, surgeries, and ER visits in previous 12 months J.  [place] such as hearing and vision if needed, cognitive and depression L. Referrals and appointments - none  In addition, I have reviewed and discussed with patient certain preventive protocols, quality metrics, and best practice recommendations. A written personalized care plan for preventive services  as well as general preventive health recommendations were provided to patient.  See attached scanned questionnaire for additional information.   Signed,  Fabio Neighbors, LPN Nurse Health Advisor   Nurse Recommendations: None.

## 2018-02-22 NOTE — Patient Instructions (Addendum)
 The CDC recommends two doses of Shingrix (the shingles vaccine) separated by 2 to 6 months for adults age 71 years and older. I recommend checking with your insurance plan regarding coverage for this vaccine.    Preventive Care 65 Years and Older, Male Preventive care refers to lifestyle choices and visits with your health care provider that can promote health and wellness. What does preventive care include?  A yearly physical exam. This is also called an annual well check.  Dental exams once or twice a year.  Routine eye exams. Ask your health care provider how often you should have your eyes checked.  Personal lifestyle choices, including: ? Daily care of your teeth and gums. ? Regular physical activity. ? Eating a healthy diet. ? Avoiding tobacco and drug use. ? Limiting alcohol use. ? Practicing safe sex. ? Taking low doses of aspirin every day. ? Taking vitamin and mineral supplements as recommended by your health care provider. What happens during an annual well check? The services and screenings done by your health care provider during your annual well check will depend on your age, overall health, lifestyle risk factors, and family history of disease. Counseling Your health care provider may ask you questions about your:  Alcohol use.  Tobacco use.  Drug use.  Emotional well-being.  Home and relationship well-being.  Sexual activity.  Eating habits.  History of falls.  Memory and ability to understand (cognition).  Work and work environment.  Screening You may have the following tests or measurements:  Height, weight, and BMI.  Blood pressure.  Lipid and cholesterol levels. These may be checked every 5 years, or more frequently if you are over 71 years old.  Skin check.  Lung cancer screening. You may have this screening every year starting at age 55 if you have a 30-pack-year history of smoking and currently smoke or have quit within the past 15  years.  Fecal occult blood test (FOBT) of the stool. You may have this test every year starting at age 71.  Flexible sigmoidoscopy or colonoscopy. You may have a sigmoidoscopy every 5 years or a colonoscopy every 10 years starting at age 71.  Prostate cancer screening. Recommendations will vary depending on your family history and other risks.  Hepatitis C blood test.  Hepatitis B blood test.  Sexually transmitted disease (STD) testing.  Diabetes screening. This is done by checking your blood sugar (glucose) after you have not eaten for a while (fasting). You may have this done every 1-3 years.  Abdominal aortic aneurysm (AAA) screening. You may need this if you are a current or former smoker.  Osteoporosis. You may be screened starting at age 70 if you are at high risk.  Talk with your health care provider about your test results, treatment options, and if necessary, the need for more tests. Vaccines Your health care provider may recommend certain vaccines, such as:  Influenza vaccine. This is recommended every year.  Tetanus, diphtheria, and acellular pertussis (Tdap, Td) vaccine. You may need a Td booster every 10 years.  Varicella vaccine. You may need this if you have not been vaccinated.  Zoster vaccine. You may need this after age 60.  Measles, mumps, and rubella (MMR) vaccine. You may need at least one dose of MMR if you were born in 1957 or later. You may also need a second dose.  Pneumococcal 13-valent conjugate (PCV13) vaccine. One dose is recommended after age 65.  Pneumococcal polysaccharide (PPSV23) vaccine. One dose is   is recommended after age 27.  Meningococcal vaccine. You may need this if you have certain conditions.  Hepatitis A vaccine. You may need this if you have certain conditions or if you travel or work in places where you may be exposed to hepatitis A.  Hepatitis B vaccine. You may need this if you have certain conditions or if you travel or work in  places where you may be exposed to hepatitis B.  Haemophilus influenzae type b (Hib) vaccine. You may need this if you have certain risk factors.  Talk to your health care provider about which screenings and vaccines you need and how often you need them. This information is not intended to replace advice given to you by your health care provider. Make sure you discuss any questions you have with your health care provider. Document Released: 10/05/2015 Document Revised: 05/28/2016 Document Reviewed: 07/10/2015 Elsevier Interactive Patient Education  [employer].

## 2018-02-22 NOTE — Patient Instructions (Signed)
Mr. Ruben [name] , Thank you for taking time to come for your Medicare Wellness Visit. I appreciate your ongoing commitment to your health goals. Please review the following plan we discussed and let me know if I can assist you in the future.   Screening recommendations/referrals: Colonoscopy: Up to date Recommended yearly ophthalmology/optometry visit for glaucoma screening and checkup Recommended yearly dental visit for hygiene and checkup  Vaccinations: Influenza vaccine: Up to date Pneumococcal vaccine: Up to date Tdap vaccine: Up to date Shingles vaccine: Pt declines today.     Advanced directives: Already on file.  Conditions/risks identified: Recommend to increase water intake to 4-6 glasses a day.   Next appointment: 10:00 AM today with Dr Caryn Section.   Preventive Care 71 Years and Older, Male Preventive care refers to lifestyle choices and visits with your health care provider that can promote health and wellness. What does preventive care include?  A yearly physical exam. This is also called an annual well check.  Dental exams once or twice a year.  Routine eye exams. Ask your health care provider how often you should have your eyes checked.  Personal lifestyle choices, including:  Daily care of your teeth and gums.  Regular physical activity.  Eating a healthy diet.  Avoiding tobacco and drug use.  Limiting alcohol use.  Practicing safe sex.  Taking low doses of aspirin every day.  Taking vitamin and mineral supplements as recommended by your health care provider. What happens during an annual well check? The services and screenings done by your health care provider during your annual well check will depend on your age, overall health, lifestyle risk factors, and family history of disease. Counseling  Your health care provider may ask you questions about your:  Alcohol use.  Tobacco use.  Drug use.  Emotional well-being.  Home and relationship  well-being.  Sexual activity.  Eating habits.  History of falls.  Memory and ability to understand (cognition).  Work and work Statistician. Screening  You may have the following tests or measurements:  Height, weight, and BMI.  Blood pressure.  Lipid and cholesterol levels. These may be checked every 5 years, or more frequently if you are over 68 years old.  Skin check.  Lung cancer screening. You may have this screening every year starting at age 32 if you have a 30-pack-year history of smoking and currently smoke or have quit within the past 15 years.  Fecal occult blood test (FOBT) of the stool. You may have this test every year starting at age 9.  Flexible sigmoidoscopy or colonoscopy. You may have a sigmoidoscopy every 5 years or a colonoscopy every 10 years starting at age 33.  Prostate cancer screening. Recommendations will vary depending on your family history and other risks.  Hepatitis C blood test.  Hepatitis B blood test.  Sexually transmitted disease (STD) testing.  Diabetes screening. This is done by checking your blood sugar (glucose) after you have not eaten for a while (fasting). You may have this done every 1-3 years.  Abdominal aortic aneurysm (AAA) screening. You may need this if you are a current or former smoker.  Osteoporosis. You may be screened starting at age 6 if you are at high risk. Talk with your health care provider about your test results, treatment options, and if necessary, the need for more tests. Vaccines  Your health care provider may recommend certain vaccines, such as:  Influenza vaccine. This is recommended every year.  Tetanus, diphtheria, and acellular pertussis (Tdap,  Td) vaccine. You may need a Td booster every 10 years.  Zoster vaccine. You may need this after age 61.  Pneumococcal 13-valent conjugate (PCV13) vaccine. One dose is recommended after age 74.  Pneumococcal polysaccharide (PPSV23) vaccine. One dose is  recommended after age 40. Talk to your health care provider about which screenings and vaccines you need and how often you need them. This information is not intended to replace advice given to you by your health care provider. Make sure you discuss any questions you have with your health care provider. Document Released: 10/05/2015 Document Revised: 05/28/2016 Document Reviewed: 07/10/2015 Elsevier Interactive Patient Education  2017 [place] Prevention in the Home Falls can cause injuries. They can happen to people of all ages. There are many things you can do to make your home safe and to help prevent falls. What can I do on the outside of my home?  Regularly fix the edges of walkways and driveways and fix any cracks.  Remove anything that might make you trip as you walk through a door, such as a raised step or threshold.  Trim any bushes or trees on the path to your home.  Use bright outdoor lighting.  Clear any walking paths of anything that might make someone trip, such as rocks or tools.  Regularly check to see if handrails are loose or broken. Make sure that both sides of any steps have handrails.  Any raised decks and porches should have guardrails on the edges.  Have any leaves, snow, or ice cleared regularly.  Use sand or salt on walking paths during winter.  Clean up any spills in your garage right away. This includes oil or grease spills. What can I do in the bathroom?  Use night lights.  Install grab bars by the toilet and in the tub and shower. Do not use towel bars as grab bars.  Use non-skid mats or decals in the tub or shower.  If you need to sit down in the shower, use a plastic, non-slip stool.  Keep the floor dry. Clean up any water that spills on the floor as soon as it happens.  Remove soap buildup in the tub or shower regularly.  Attach bath mats securely with double-sided non-slip rug tape.  Do not have throw rugs and other things on  the floor that can make you trip. What can I do in the bedroom?  Use night lights.  Make sure that you have a light by your bed that is easy to reach.  Do not use any sheets or blankets that are too big for your bed. They should not hang down onto the floor.  Have a firm chair that has side arms. You can use this for support while you get dressed.  Do not have throw rugs and other things on the floor that can make you trip. What can I do in the kitchen?  Clean up any spills right away.  Avoid walking on wet floors.  Keep items that you use a lot in easy-to-reach places.  If you need to reach something above you, use a strong step stool that has a grab bar.  Keep electrical cords out of the way.  Do not use floor polish or wax that makes floors slippery. If you must use wax, use non-skid floor wax.  Do not have throw rugs and other things on the floor that can make you trip. What can I do with my stairs?  Do not  leave any items on the stairs.  Make sure that there are handrails on both sides of the stairs and use them. Fix handrails that are broken or loose. Make sure that handrails are as long as the stairways.  Check any carpeting to make sure that it is firmly attached to the stairs. Fix any carpet that is loose or worn.  Avoid having throw rugs at the top or bottom of the stairs. If you do have throw rugs, attach them to the floor with carpet tape.  Make sure that you have a light switch at the top of the stairs and the bottom of the stairs. If you do not have them, ask someone to add them for you. What else can I do to help prevent falls?  Wear shoes that:  Do not have high heels.  Have rubber bottoms.  Are comfortable and fit you well.  Are closed at the toe. Do not wear sandals.  If you use a stepladder:  Make sure that it is fully opened. Do not climb a closed stepladder.  Make sure that both sides of the stepladder are locked into place.  Ask someone to  hold it for you, if possible.  Clearly mark and make sure that you can see:  Any grab bars or handrails.  First and last steps.  Where the edge of each step is.  Use tools that help you move around (mobility aids) if they are needed. These include:  Canes.  Walkers.  Scooters.  Crutches.  Turn on the lights when you go into a dark area. Replace any light bulbs as soon as they burn out.  Set up your furniture so you have a clear path. Avoid moving your furniture around.  If any of your floors are uneven, fix them.  If there are any pets around you, be aware of where they are.  Review your medicines with your doctor. Some medicines can make you feel dizzy. This can increase your chance of falling. Ask your doctor what other things that you can do to help prevent falls. This information is not intended to replace advice given to you by your health care provider. Make sure you discuss any questions you have with your health care provider. Document Released: 07/05/2009 Document Revised: 02/14/2016 Document Reviewed: 10/13/2014 Elsevier Interactive Patient Education  2017 [employer].

## 2018-02-26 DIAGNOSIS — E872 Acidosis: Secondary | ICD-10-CM | POA: Diagnosis not present

## 2018-02-26 DIAGNOSIS — N183 Chronic kidney disease, stage 3 (moderate): Secondary | ICD-10-CM | POA: Diagnosis not present

## 2018-02-26 DIAGNOSIS — N2581 Secondary hyperparathyroidism of renal origin: Secondary | ICD-10-CM | POA: Diagnosis not present

## 2018-02-26 DIAGNOSIS — N1330 Unspecified hydronephrosis: Secondary | ICD-10-CM | POA: Diagnosis not present

## 2018-03-18 ENCOUNTER — Telehealth: Payer: Self-pay | Admitting: Pharmacy Technician

## 2018-03-18 MED FILL — JAKAFI 25 MG TABLET: 25 | 30 days supply | Qty: 60 | Fill #4

## 2018-03-18 NOTE — Telephone Encounter (Signed)
Oral Oncology Patient Advocate Encounter  Was successful in securing patient a second grant of $ 8500.00 from Patient [facility]) to provide copayment coverage for his Jakafi.  This will keep the out of pocket expense at $0.    Billing information remains the same. Second grant is approved until 05/22/18 when patient is due to re-apply for another grant.  [place] [facility] [address] Phone [phone] Fax [phone] 03/18/2018 9:31 AM

## 2018-03-19 ENCOUNTER — Encounter: Payer: Self-pay | Admitting: Hematology and Oncology

## 2018-03-23 ENCOUNTER — Ambulatory Visit
Admission: EM | Admit: 2018-03-23 | Discharge: 2018-03-23 | Disposition: A | Discharge status: Home / Self Care | Payer: Medicare Other | Attending: Family Medicine | Admitting: Family Medicine

## 2018-03-23 ENCOUNTER — Other Ambulatory Visit: Payer: Self-pay

## 2018-03-23 ENCOUNTER — Encounter: Payer: Self-pay | Admitting: Emergency Medicine

## 2018-03-23 DIAGNOSIS — R040 Epistaxis: Secondary | ICD-10-CM

## 2018-03-23 NOTE — ED Triage Notes (Signed)
Patient c/o nose bleed that started 30 min. Ago.

## 2018-03-23 NOTE — ED Provider Notes (Signed)
[facility]    CSN: [id number] Arrival date & time: 03/23/18  1433  History   Chief Complaint Chief Complaint  Patient presents with  . Epistaxis   HPI  72 year old male presents with a nosebleed.  Patient's wife states that he has been bleeding from his left nostril past hour.  Has continued to persist despite compression.  Patient actively bleeding.  No medications tried.  He has been compressing.  No known inciting event.  No known exacerbating or relieving factors.  No other associated symptoms.  No other complaints.  Past Medical History:  Diagnosis Date  . BPH (benign prostatic hyperplasia)   . Crohn disease ([place])   . Crohn's disease ([place])   . Glaucoma   . Glaucoma   . Gout   . History of chicken pox   . RBBB   . Right bundle branch block    Patient Active Problem List   Diagnosis Date Noted  . Goals of care, counseling/discussion 01/06/2018  . High triglycerides 10/14/2017  . 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 Renal insufficiency 07/23/2016  . 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 Right ankle pain 10/31/2015  . Hyperuricemia 09/05/2015  . Back pain with radiation 08/23/2015  . BPH (benign prostatic hyperplasia) 08/23/2015  . History of tobacco use 08/23/2015  . Glaucoma 08/23/2015  . Enlarged prostate without lower urinary tract symptoms (luts) 08/23/2015  . Elevated prostate specific antigen (PSA) 08/23/2015  . Dermatitis 08/23/2015  . Crohn disease ([place]) 08/23/2015  . Other prurigo 08/23/2015  . Dorsalgia 08/23/2015  . Personal history of nicotine dependence 08/23/2015  . 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 Secondary polycythemia 03/28/2015  . 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 Right bundle-branch block 05/03/2012   Past Surgical History:  Procedure Laterality Date  . COLONOSCOPY WITH PROPOFOL N/A 03/27/2017   Procedure: COLONOSCOPY WITH PROPOFOL;  Surgeon: [name], MD;  Location: [facility] ENDOSCOPY;   Service: Endoscopy;  Laterality: N/A;  . PROSTATE SURGERY  2008   Prostate Biopsy in [place] to elevated PSA.  per patient normal  . Skin Lesion Basal cell removed    . wart removal     from eyelid   Home Medications    Prior to Admission medications   Medication Sig Start Date End Date Taking? Authorizing Provider  allopurinol (ZYLOPRIM) 300 MG tablet TAKE 1 TABLET BY MOUTH EVERY DAY 01/23/18  Yes Karen Kitchens, NP  aspirin 81 MG tablet Take 81 mg by mouth daily.   Yes [provider]  Cholecalciferol (VITAMIN D3) 1000 units CAPS Take 1 capsule by mouth daily.   Yes [provider]  Cyanocobalamin (RA VITAMIN B-12 TR) 1000 MCG TBCR daily.    Yes [provider]  ferrous sulfate 325 (65 FE) MG tablet Take 325 mg by mouth 3 (three) times a week.   Yes [provider]  finasteride (PROSCAR) 5 MG tablet Take 1 tablet (5 mg total) by mouth daily. 09/17/17  Yes Hollice Espy, MD  folic acid (FOLVITE) 1 MG tablet TAKE 1 TABLET (1,000 MCG TOTAL) BY MOUTH ONCE DAILY. MUST KEEP APPT FOR FURTHER REFILLS. 03/20/15  Yes [provider]  JAKAFI 25 MG tablet TAKE 1 TABLET (25 MG TOTAL) BY MOUTH 2 (TWO) TIMES DAILY. Patient taking differently: TAKE 2 TABLET (50 MG TOTAL) BY MOUTH DAILY. 11/10/17  Yes Karen Kitchens, NP  latanoprost (XALATAN) 0.005 % ophthalmic solution Place 1 drop into both eyes daily.  Yes [provider]  Multiple Vitamins-Minerals (CENTRUM SILVER ULTRA MENS) TABS Take by mouth. Reported on 01/30/2016   Yes [provider]  Omega-3 Fatty Acids (KP FISH OIL) 1200 MG CAPS Take by mouth daily.    Yes [provider]  sodium bicarbonate 650 MG tablet Take 650 mg by mouth 2 (two) times daily. 06/06/17  Yes [provider]  timolol (BETIMOL) 0.25 % ophthalmic solution Place 1 drop into both eyes daily.    Yes [provider]   Family History Family History  Problem Relation Age of Onset  . Cancer  Mother        Liver  . Cancer Father        Prostate  . Cancer Sister        Hodgkins lymphoma  . Cancer Paternal Aunt        Breast   Social History Social History   Tobacco Use  . Smoking status: Former Smoker    Packs/day: 0.25    Years: 20.00    Pack years: 5.00    Types: Cigarettes    Last attempt to quit: 09/28/2003    Years since quitting: 14.4  . Smokeless tobacco: Never Used  Substance Use Topics  . Alcohol use: Yes    Alcohol/week: 2.4 - 3.0 oz    Types: 4 - 5 Glasses of wine per week  . Drug use: No   Allergies   Patient has no known allergies.   Review of Systems Review of Systems  Constitutional: Negative.   HENT:       Nosebleed.   Physical Exam Triage Vital Signs ED Triage Vitals  Enc Vitals Group     BP 03/23/18 1442 (!) 143/89     Pulse Rate 03/23/18 1442 79     Resp 03/23/18 1442 16     Temp --      Temp Source 03/23/18 1442 Oral     SpO2 03/23/18 1442 100 %     Weight 03/23/18 1440 185 lb (83.9 kg)     Height 03/23/18 1440 6' (1.829 m)     Head Circumference --      Peak Flow --      Pain Score 03/23/18 1440 0     Pain Loc --      Pain Edu? --      Excl. in [place]? --    Updated Vital Signs BP (!) 143/89 (BP Location: Right Arm)   Pulse 79   Resp 16   Ht 6' (1.829 m)   Wt 185 lb (83.9 kg)   SpO2 100%   BMI 25.09 kg/m   Physical Exam  Constitutional: He is oriented to person, place, and time. He appears well-developed. No distress.  HENT:  Head: Normocephalic and atraumatic.  Nose with profuse, active bleeding from the left nostril.  Eyes: Conjunctivae are normal. Right eye exhibits no discharge. Left eye exhibits no discharge.  Pulmonary/Chest: Effort normal. No respiratory distress.  Neurological: He is alert and oriented to person, place, and time.  Psychiatric: He has a normal mood and affect. His behavior is normal.  Nursing note and vitals reviewed.  UC Treatments / Results  Labs (all labs ordered are listed, but only  abnormal results are displayed) Labs Reviewed - No data to display  EKG None  Radiology No results found.  Procedures Procedures (including critical care time)  Medications Ordered in UC Medications - No data to display  Initial Impression / Assessment and Plan /  UC Course  I have reviewed the triage vital signs and the nursing notes.  Pertinent labs & imaging results that were available during my care of the patient were reviewed by me and considered in my medical decision making (see chart for details).    72 year old male presents with a suspected anterior nosebleed.  Left sided was packed.  Patient had mild persistent bleeding despite packing.  Sent directly to ENT.  Final Clinical Impressions(s) / UC Diagnoses   Final diagnoses:  Left-sided epistaxis     Discharge Instructions     Patient with prolonged anterior nose bleed.  I have packed it but still bleeding.  Thank you  Dr Lacinda Axon   ED Prescriptions    None     Controlled Substance Prescriptions [place] Controlled Substance Registry consulted? Not Applicable   Coral Spikes, DO 03/23/18 1601

## 2018-03-23 NOTE — Discharge Instructions (Signed)
Patient with prolonged anterior nose bleed.  I have packed it but still bleeding.  Thank you  Dr Lacinda Axon

## 2018-03-24 DIAGNOSIS — R040 Epistaxis: Secondary | ICD-10-CM | POA: Diagnosis not present

## 2018-03-26 ENCOUNTER — Encounter: Payer: Self-pay | Admitting: Hematology and Oncology

## 2018-03-29 DIAGNOSIS — R040 Epistaxis: Secondary | ICD-10-CM | POA: Diagnosis not present

## 2018-04-06 NOTE — Progress Notes (Signed)
[facility] day:  04/07/2018  Chief Complaint: Ruben [name] is a 72 y.o. 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  HPI: The patient was last seen in the medical oncology clinic on 01/06/2018.  At that time, he was doing well.  Patient had skin irritation to his face related to the use of Efudex cream.  Patient had no other acute concerns. Exam was stable. Labs from [facility] done on 01/04/2018 were reviewed.  CBC on 02/17/2018 revealed a WBC of 16.4 with an [place] of 12,400.  Hemoglobin 11.4, hematocrit 34.9, MCV 82.2, and platelets 185,000.  Lipid panel was normal (total cholesterol 144, triglycerides 113, HDL 31, LDL 90).  Patient was seen at the med in urgent care center on 03/23/2018 for an episode of epistaxis.  Notes reviewed.  Patient with a prolonged anterior nosebleed. Blood pressure slightly elevated 143/89.  [place] was packed, however continued to bleed.  He was sent to ENT for further evaluation.  ENT notes not available for review, however patient notes that he did not require further treatment. Dr. Kathyrn Sheriff advised patient that epistaxis event was precipitated by the heat and mucosal dryness. Patient was advised to hold daily ASA therapy until advised by hematology to restart.   In the interim, patient has been doing well. He denies acute concerns today. Patient denies bleeding; no hematochezia, melena, or gross hematuria. He has not appreciated any new areas of unexplained bruising. Patient denies that he has experienced any B symptoms. He denies any interval infections. Patient denies episodes of chest pain, shortness of breath, or palpitations.   Patient advises that he maintains an adequate appetite. He is eating well. Weight today is 184 lb 8.4 oz (83.7 kg), which compared to his last visit to the clinic, represents a 2 pound weight loss.   Patient denies pain in the clinic today.   Past Medical History:   Diagnosis Date  . BPH (benign prostatic hyperplasia)   . Crohn disease ([place])   . Crohn's disease ([place])   . Glaucoma   . Glaucoma   . Gout   . History of chicken pox   . RBBB   . Right bundle branch block     Past Surgical History:  Procedure Laterality Date  . COLONOSCOPY WITH PROPOFOL N/A 03/27/2017   Procedure: COLONOSCOPY WITH PROPOFOL;  Surgeon: [name], MD;  Location: [facility] ENDOSCOPY;  Service: Endoscopy;  Laterality: N/A;  . PROSTATE SURGERY  2008   Prostate Biopsy in [place] to elevated PSA.  per patient normal  . Skin Lesion Basal cell removed    . wart removal     from eyelid    Family History  Problem Relation Age of Onset  . Cancer Mother        Liver  . Cancer Father        Prostate  . Cancer Sister        Hodgkins lymphoma  . Cancer Paternal Aunt        Breast    Social History:  reports that he quit smoking about 14 years ago. His smoking use included cigarettes. He has a 5.00 pack-year smoking history. He has never used smokeless tobacco. He reports that he drinks about 2.4 - 3.0 oz of alcohol per week. He reports that he does not use drugs.  He previously worked for [employer].  He volunteers for [employer].  The patient is accompanied  by his wife, Ruben [name], today.  Allergies: No Known Allergies  Current Medications: Current Outpatient Medications  Medication Sig Dispense Refill  . allopurinol (ZYLOPRIM) 300 MG tablet TAKE 1 TABLET BY MOUTH EVERY DAY 90 tablet 0  . aspirin 81 MG tablet Take 81 mg by mouth daily.    . Cholecalciferol (VITAMIN D3) 1000 units CAPS Take 1 capsule by mouth daily.    . Cyanocobalamin (RA VITAMIN B-12 TR) 1000 MCG TBCR daily.     . ferrous sulfate 325 (65 FE) MG tablet Take 325 mg by mouth 3 (three) times a week.    . finasteride (PROSCAR) 5 MG tablet Take 1 tablet (5 mg total) by mouth daily. 90 tablet 3  . folic acid (FOLVITE) 1 MG tablet TAKE 1 TABLET (1,000 MCG TOTAL) BY MOUTH ONCE DAILY. MUST KEEP  APPT FOR FURTHER REFILLS.    Marland Kitchen JAKAFI 25 MG tablet TAKE 1 TABLET (25 MG TOTAL) BY MOUTH 2 (TWO) TIMES DAILY. (Patient taking differently: TAKE 2 TABLET (50 MG TOTAL) BY MOUTH DAILY.) 60 tablet 5  . latanoprost (XALATAN) 0.005 % ophthalmic solution Place 1 drop into both eyes daily.     . Multiple Vitamins-Minerals (CENTRUM SILVER ULTRA MENS) TABS Take by mouth. Reported on 01/30/2016    . Omega-3 Fatty Acids (KP FISH OIL) 1200 MG CAPS Take by mouth daily.     . sodium bicarbonate 650 MG tablet Take 650 mg by mouth 2 (two) times daily.    . timolol (BETIMOL) 0.25 % ophthalmic solution Place 1 drop into both eyes daily.      No current facility-administered medications for this visit.     Review of Systems  Constitutional: Positive for weight loss (down 2 pounds). Negative for diaphoresis, fever and malaise/fatigue.       "Im doing well". Active.   HENT: Positive for nosebleeds. Negative for sore throat.   Eyes: Negative.   Respiratory: Negative for cough, hemoptysis, sputum production and shortness of breath.   Cardiovascular: Negative for chest pain, palpitations, orthopnea, leg swelling and PND.  Gastrointestinal: Negative for abdominal pain, blood in stool, constipation, diarrhea, melena, nausea and vomiting.  Genitourinary: Negative for dysuria, frequency, hematuria and urgency.       Urinary catheterization (self) 4x/day  Musculoskeletal: Negative for back pain, falls, joint pain and myalgias.  Skin: Negative for itching and rash.  Neurological: Negative for dizziness, tremors, weakness and headaches.  Endo/Heme/Allergies: Does not bruise/bleed easily.  Psychiatric/Behavioral: Negative for depression, memory loss and suicidal ideas. The patient is not nervous/anxious and does not have insomnia.   All other systems reviewed and are negative.  Performance status (ECOG): 1 - Symptomatic but completely ambulatory  Vital Signs BP 133/81 (BP Location: Left Arm, Patient Position: Sitting)    Pulse (!) 59   Temp (!) 97.3 F (36.3 C) (Tympanic)   Resp 18   Wt 184 lb 8.4 oz (83.7 kg)   SpO2 100%   BMI 25.03 kg/m   Physical Exam  Constitutional: He is oriented to person, place, and time and well-developed, well-nourished, and in no distress.  HENT:  Head: Normocephalic and atraumatic.  Shaheem Pichon hair  Eyes: Pupils are equal, round, and reactive to light. EOM are normal. No scleral icterus.  Glasses.  Blue eyes.  Neck: Normal range of motion. Neck supple. No tracheal deviation present. No thyromegaly present.  Cardiovascular: Normal rate, regular rhythm and normal heart sounds. Exam reveals no gallop and no friction rub.  No murmur heard. Pulmonary/Chest: Effort normal and  breath sounds normal. No respiratory distress. He has no wheezes. He has no rales.  Abdominal: Soft. Bowel sounds are normal. He exhibits no distension. There is no tenderness.  Musculoskeletal: Normal range of motion. He exhibits no edema or tenderness.  Lymphadenopathy:    He has no cervical adenopathy.    He has no axillary adenopathy.       Right: No inguinal and no supraclavicular adenopathy present.       Left: No inguinal and no supraclavicular adenopathy present.  Neurological: He is alert and oriented to person, place, and time.  Skin: Skin is warm and dry. No rash noted. No erythema.  Psychiatric: Mood, affect and judgment normal.  Nursing note and vitals reviewed.   Appointment on 04/07/2018  Component Date Value Ref Range Status  . Uric Acid, Serum 04/07/2018 4.9  3.7 - 8.6 mg/dL Final   Comment: Please note change in reference range. Performed at [facility], [address], [place], [place] [address]   . LDH 04/07/2018 616* 98 - 192 U/L Final   Performed at [facility], [address], [place], [place] [address]  . Sodium 04/07/2018 137  135 - 145 mmol/L Final  . Potassium 04/07/2018 4.4  3.5 - 5.1 mmol/L Final  . Chloride 04/07/2018 105  98 - 111 mmol/L  Final   Please note change in reference range.  . CO2 04/07/2018 23  22 - 32 mmol/L Final  . Glucose, Bld 04/07/2018 95  70 - 99 mg/dL Final   Please note change in reference range.  . BUN 04/07/2018 29* 8 - 23 mg/dL Final   Please note change in reference range.  . Creatinine, Ser 04/07/2018 1.43* 0.61 - 1.24 mg/dL Final  . Calcium 04/07/2018 9.0  8.9 - 10.3 mg/dL Final  . Total Protein 04/07/2018 7.9  6.5 - 8.1 g/dL Final  . Albumin 04/07/2018 4.3  3.5 - 5.0 g/dL Final  . AST 04/07/2018 36  15 - 41 U/L Final  . ALT 04/07/2018 16  0 - 44 U/L Final   Please note change in reference range.  . Alkaline Phosphatase 04/07/2018 52  38 - 126 U/L Final  . Total Bilirubin 04/07/2018 0.7  0.3 - 1.2 mg/dL Final  . GFR calc non Af Amer 04/07/2018 48* >60 mL/min Final  . GFR calc Af Amer 04/07/2018 55* >60 mL/min Final   Comment: (NOTE) The eGFR has been calculated using the CKD EPI equation. This calculation has not been validated in all clinical situations. eGFR's persistently <60 mL/min signify possible Chronic Kidney Disease.   Georgiann Hahn gap 04/07/2018 9  5 - 15 Final   Performed at [facility], [address] [place], [place] [address]  . WBC 04/07/2018 18.8* 3.8 - 10.6 K/uL Final  . RBC 04/07/2018 3.98* 4.40 - 5.90 MIL/uL Final  . Hemoglobin 04/07/2018 10.9* 13.0 - 18.0 g/dL Final  . HCT 04/07/2018 33.1* 40.0 - 52.0 % Final  . MCV 04/07/2018 83.1  80.0 - 100.0 fL Final  . MCH 04/07/2018 27.3  26.0 - 34.0 pg Final  . MCHC 04/07/2018 32.8  32.0 - 36.0 g/dL Final  . RDW 04/07/2018 21.6* 11.5 - 14.5 % Final  . Platelets 04/07/2018 170  150 - 440 K/uL Final  . Neutrophils Relative % 04/07/2018 65  % Final  . Lymphocytes Relative 04/07/2018 6  % Final  . Monocytes Relative 04/07/2018 6  % Final  . Eosinophils Relative 04/07/2018 1  %  Final  . Basophils Relative 04/07/2018 2  % Final  . Band Neutrophils 04/07/2018 11  % Final  . Metamyelocytes Relative 04/07/2018 5  % Final   . Myelocytes 04/07/2018 2  % Final  . Blasts 04/07/2018 2  % Final  . nRBC 04/07/2018 1* 0 /100 WBC Final  . Neutro Abs 04/07/2018 15.6* 1.4 - 6.5 K/uL Final  . Lymphs Abs 04/07/2018 1.1  1.0 - 3.6 K/uL Final  . Monocytes Absolute 04/07/2018 1.1* 0.2 - 1.0 K/uL Final  . Eosinophils Absolute 04/07/2018 0.2  0 - 0.7 K/uL Final  . 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  He has had polycythemia dating back to 2013.  Hematocrit was 62.1 with a hemoglobin of 19.8 on 03/28/2015.  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  Erythropoietin level was 1.1 (low).  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  He began a phlebotomy program on 03/28/2015 to maintain a hematocrit goal of < 45.  He last underwent phlebotomy on 05/02/2015.  He is on a baby aspirin.  He is followed by GI (Dr. Vira Agar) for a history of polyps and Crohn's disease.  Last colonoscopy was 3-4 years ago.  He has become iron deficient on his phlebotomy program (ferritin 21 on 11/28/2015).   CBC on 11/28/2015 revealed a hematocrit of 39.3, hemoglobin 11.9, platelets 463,000, WBC 32,000 with an ANC of 25,000.  Differential included 71% segs, 4% lymphocytes and 17% monocytes.  Peripheral smear revealed leukocytosis with predominantly mature neutrophils, increased monocytes and rare blasts (<1%).  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  Marrow was packed (95-100% cellularity) with pan myelosis, multi-lineage dyspoiesis, and no significant increase in blasts.  There was moderate to focally marked reticulin fibrosis (grade 2-3/3).  Storage iron was not identified.  Flow cytometry revealed non-specific atypical myeloid findings with no  increase in blasts.  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  Cytogenetics were normal (46, XY).  Bone marrow on 04/07/2016 at [facility] revealed a hypercellular marrow (> 95%) with persistent involvement by myeloid neoplasm with 5% blasts.  There was mild reticulin fibrosis.  FISH t(9;22) results were normal.  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  Cytogenetics are pending.  He was briefly on hydroxyurea 500 mg a day (12/05/2015 - 12/19/2015 and 06/25/2016 - 07/03/2016).   Platelet count increased to 1.18 million and white count to 54,800 without increased blasts on 06/25/2016.  CBC on 07/02/2016 revealed a platelets of 429,000 with a white count 30,600.  He began allopurinol on 12/19/2015.  He began Jakafi 20 mg BID on 05/28/2016.  Jakafi was increased to 25 mg BID on 05/26/2017.  LDH is followed:  466 on 01/02/2016, 596 on 05/23/2016, 663 on 07/30/2016, 522 on 09/24/2016, 638 on 01/05/2017, 625 on 02/25/2017, 1144 on 06/10/2017, 720 on 06/17/2017, 728 on 06/24/2017, and 724 on 01/06/2018.  Triglycerides were 183 (< 150) on 08/20/2016, 180 on 01/05/2017, 312 on 06/17/2017, 165 on 11/25/2017, 113 on 02/17/2018.  He has urinary retention.  Renal ultrasound on 07/28/2016 revealed bilateral hydronephrosis (right > left) and a large postvoid residual (2 liters) suggesting bladder dysfunction or outlet obstruction.  He underwent temporary Foley catheter placement.  He performs I/O self catheterizations.  He is on Flomax and finasteride.  PSA was 2.02 on 07/09/2016.  He has chronic renal insufficiency (Cr 1.85; CrCl 36 ml/min).  SPEP on 09/23/2016 was negative.  Spot urine revealed 17.4% of 27.3 mg/dL.of a monoclonal protein.  24 hour urine on 10/16/2016 revealed no monoclonal protein.  Renal function transiently decreased in 06/2017 after Bactrim.  He is followed by nephrology and  urology.  Symptomatically, patient is doing well. Recent prolonged epistaxis event that resolved with nasal packing. No acute complaints. He feels generally well. Exam is stable. WBC 18,800 (ANC 15,600). Hemoglobin 10.9, hematocrit 33.1, MCV 83.1, and platelets 170,000.  BUN 29 and creatinine 1.43 (CrCl 52 mL/min). LDH 616. Uric acid normal at 4.9.  Plan: 1. Labs today: CBC with diff, CMP, LDH, uric acid. 2. Discuss platelet count. Platelets stable at 170,000. Continue Jakafi 25 mg BID.  3. Continue ferrous sulfate 325 mg on Mondays, Wednesday, Fridays. 4. Discuss BPH. Patient continues to self catheterize several times a day. Last PSA was in 2017. Will add PSA to next lab draw here in 3 months.  5. RTC in 3 months for MD assessment and labs (CBC with diff, CMP, LDH, uric acid, PSA).   Honor Loh, NP  04/07/2018 , 2:03 PM

## 2018-04-07 ENCOUNTER — Inpatient Hospital Stay: Payer: Medicare Other | Attending: Hematology and Oncology | Admitting: Urgent Care

## 2018-04-07 ENCOUNTER — Inpatient Hospital Stay: Payer: Medicare Other

## 2018-04-07 VITALS — BP 133/81 | HR 59 | Temp 97.3°F | Resp 18 | Wt 184.5 lb

## 2018-04-07 DIAGNOSIS — Z87891 Personal history of nicotine dependence: Secondary | ICD-10-CM | POA: Diagnosis not present

## 2018-04-07 DIAGNOSIS — D7581 Myelofibrosis: Secondary | ICD-10-CM | POA: Insufficient documentation

## 2018-04-07 DIAGNOSIS — Z8042 Family history of malignant neoplasm of prostate: Secondary | ICD-10-CM | POA: Insufficient documentation

## 2018-04-07 DIAGNOSIS — Z803 Family history of malignant neoplasm of breast: Secondary | ICD-10-CM

## 2018-04-07 DIAGNOSIS — R040 Epistaxis: Secondary | ICD-10-CM | POA: Insufficient documentation

## 2018-04-07 DIAGNOSIS — N289 Disorder of kidney and ureter, unspecified: Secondary | ICD-10-CM

## 2018-04-07 DIAGNOSIS — Z800 Family history of malignant neoplasm of digestive organs: Secondary | ICD-10-CM | POA: Diagnosis not present

## 2018-04-07 DIAGNOSIS — Z79899 Other long term (current) drug therapy: Secondary | ICD-10-CM | POA: Diagnosis not present

## 2018-04-07 DIAGNOSIS — M109 Gout, unspecified: Secondary | ICD-10-CM | POA: Insufficient documentation

## 2018-04-07 DIAGNOSIS — Z7982 Long term (current) use of aspirin: Secondary | ICD-10-CM | POA: Diagnosis not present

## 2018-04-07 DIAGNOSIS — D45 Polycythemia vera: Secondary | ICD-10-CM

## 2018-04-07 DIAGNOSIS — D72829 Elevated white blood cell count, unspecified: Secondary | ICD-10-CM | POA: Diagnosis not present

## 2018-04-07 DIAGNOSIS — Z806 Family history of leukemia: Secondary | ICD-10-CM | POA: Insufficient documentation

## 2018-04-07 DIAGNOSIS — K5090 Crohn's disease, unspecified, without complications: Secondary | ICD-10-CM | POA: Diagnosis not present

## 2018-04-07 DIAGNOSIS — N189 Chronic kidney disease, unspecified: Secondary | ICD-10-CM | POA: Diagnosis not present

## 2018-04-07 DIAGNOSIS — Z148 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: Secondary | ICD-10-CM | POA: Diagnosis not present

## 2018-04-07 DIAGNOSIS — N400 Enlarged prostate without lower urinary tract symptoms: Secondary | ICD-10-CM | POA: Insufficient documentation

## 2018-04-07 DIAGNOSIS — R7402 Elevation of levels of lactic acid dehydrogenase (LDH): Secondary | ICD-10-CM

## 2018-04-07 DIAGNOSIS — I4510 Unspecified right bundle-branch block: Secondary | ICD-10-CM | POA: Diagnosis not present

## 2018-04-07 DIAGNOSIS — R740 Nonspecific elevation of levels of transaminase and lactic acid dehydrogenase [LDH]: Secondary | ICD-10-CM

## 2018-04-07 LAB — CBC WITH DIFFERENTIAL/PLATELET
Band Neutrophils: 11 %
Basophils Absolute: 0.4 10*3/uL — ABNORMAL HIGH (ref 0–0.1)
Basophils Relative: 2 %
Blasts: 2 %
Eosinophils Absolute: 0.2 10*3/uL (ref 0–0.7)
Eosinophils Relative: 1 %
HCT: 33.1 % — ABNORMAL LOW (ref 40.0–52.0)
Hemoglobin: 10.9 g/dL — ABNORMAL LOW (ref 13.0–18.0)
Lymphocytes Relative: 6 %
Lymphs Abs: 1.1 10*3/uL (ref 1.0–3.6)
MCH: 27.3 pg (ref 26.0–34.0)
MCHC: 32.8 g/dL (ref 32.0–36.0)
MCV: 83.1 fL (ref 80.0–100.0)
Metamyelocytes Relative: 5 %
Monocytes Absolute: 1.1 10*3/uL — ABNORMAL HIGH (ref 0.2–1.0)
Monocytes Relative: 6 %
Myelocytes: 2 %
Neutro Abs: 15.6 10*3/uL — ABNORMAL HIGH (ref 1.4–6.5)
Neutrophils Relative %: 65 %
Platelets: 170 10*3/uL (ref 150–440)
RBC: 3.98 MIL/uL — ABNORMAL LOW (ref 4.40–5.90)
RDW: 21.6 % — ABNORMAL HIGH (ref 11.5–14.5)
WBC: 18.8 10*3/uL — ABNORMAL HIGH (ref 3.8–10.6)
nRBC: 1 /100 WBC — ABNORMAL HIGH

## 2018-04-07 LAB — URIC ACID: Uric Acid, Serum: 4.9 mg/dL (ref 3.7–8.6)

## 2018-04-07 LAB — COMPREHENSIVE METABOLIC PANEL
ALT: 16 U/L (ref 0–44)
AST: 36 U/L (ref 15–41)
Albumin: 4.3 g/dL (ref 3.5–5.0)
Alkaline Phosphatase: 52 U/L (ref 38–126)
Anion gap: 9 (ref 5–15)
BUN: 29 mg/dL — ABNORMAL HIGH (ref 8–23)
CO2: 23 mmol/L (ref 22–32)
Calcium: 9 mg/dL (ref 8.9–10.3)
Chloride: 105 mmol/L (ref 98–111)
Creatinine, Ser: 1.43 mg/dL — ABNORMAL HIGH (ref 0.61–1.24)
GFR calc Af Amer: 55 mL/min — ABNORMAL LOW (ref >60)
GFR calc non Af Amer: 48 mL/min — ABNORMAL LOW (ref >60)
Glucose, Bld: 95 mg/dL (ref 70–99)
Potassium: 4.4 mmol/L (ref 3.5–5.1)
Sodium: 137 mmol/L (ref 135–145)
Total Bilirubin: 0.7 mg/dL (ref 0.3–1.2)
Total Protein: 7.9 g/dL (ref 6.5–8.1)

## 2018-04-07 LAB — LACTATE DEHYDROGENASE: LDH: 616 U/L — ABNORMAL HIGH (ref 98–192)

## 2018-04-07 NOTE — Progress Notes (Signed)
Patient states he has head congestion today.  The week of July 4th patient had a nosebleed he could not get stopped.  He went to ED and had his nose packed.  It was packed for a week and he saw Dr. Kathyrn Sheriff to have it removed.  No further problems.

## 2018-04-20 MED FILL — JAKAFI 25 MG TABLET: 25 | 30 days supply | Qty: 60 | Fill #5

## 2018-04-22 ENCOUNTER — Telehealth: Payer: Self-pay | Admitting: Pharmacy Technician

## 2018-04-22 NOTE — Telephone Encounter (Signed)
Oral Oncology Patient Advocate Encounter   Was successful in renewing patients grant from Patient [facility]) for [phone] to provide copayment coverage for Jakafi.    This will keep the out of pocket expense at $0.    The billing information remains the same and is listed below.    Member ID: [id number] Group ID: [id number] RxBin ID: [id number] PCN: [id number]  Assistance Amount: $8,500.00  Eligibility Start Date: 05/22/2018 Eligibility End Date: 05/22/2019  [place] Nancy Preble Patient [place] Phone [phone] Fax [phone] 04/22/2018 10:51 AM

## 2018-04-28 ENCOUNTER — Telehealth: Payer: Self-pay | Admitting: Pharmacist

## 2018-04-28 NOTE — Telephone Encounter (Signed)
Oral Chemotherapy Pharmacist Encounter   Attempted to reach patient for follow up on oral medication: Jakafi. No answer. Left VM for patient to call back.    Darl Pikes, PharmD, BCPS, [facility] Hematology/Oncology Clinical Pharmacist [facility] [phone]  04/28/2018 3:32 PM

## 2018-04-28 NOTE — Telephone Encounter (Signed)
Oral Chemotherapy Pharmacist Encounter  Follow-Up Form  Called patient today to follow up regarding patient's oral chemotherapy medication: Jakafi (ruxolitinib)  Original Start date of oral chemotherapy: 05/2016  Pt reports 0 tablets/doses of Jakafi missed in the last month.   Pt reports the following side effects: None reported  Recent labs reviewed: CBC from 04/07/18  New medications?: None reported Other Issues: None reported  Patient knows to call the office with questions or concerns. Oral Oncology Clinic will continue to follow.  Darl Pikes, PharmD, BCPS, [facility] Hematology/Oncology Clinical Pharmacist [facility] [phone]  04/28/2018 3:39 PM

## 2018-05-03 DIAGNOSIS — H401131 Primary open-angle glaucoma, bilateral, mild stage: Secondary | ICD-10-CM | POA: Diagnosis not present

## 2018-05-05 ENCOUNTER — Other Ambulatory Visit: Payer: Self-pay

## 2018-05-05 ENCOUNTER — Encounter: Payer: Self-pay | Admitting: Emergency Medicine

## 2018-05-05 ENCOUNTER — Ambulatory Visit
Admission: EM | Admit: 2018-05-05 | Discharge: 2018-05-05 | Disposition: A | Discharge status: Home / Self Care | Payer: Medicare Other | Attending: Family Medicine | Admitting: Family Medicine

## 2018-05-05 DIAGNOSIS — J342 Deviated nasal septum: Secondary | ICD-10-CM | POA: Diagnosis not present

## 2018-05-05 DIAGNOSIS — R040 Epistaxis: Secondary | ICD-10-CM | POA: Diagnosis not present

## 2018-05-05 MED ORDER — PHENYLEPHRINE HCL 0.25 % NA SOLN: 1.0000 | Freq: Once | NASAL | Status: DC

## 2018-05-05 MED ORDER — PHENYLEPHRINE HCL 0.25 % NA SOLN: 1.0000 | Freq: Four times a day (QID) | NASAL | Status: DC | PRN

## 2018-05-05 NOTE — ED Triage Notes (Signed)
Patient in today c/o nose bleed x 2 hours.

## 2018-05-05 NOTE — Discharge Instructions (Addendum)
Go directly to ENT as discussed.

## 2018-05-05 NOTE — ED Provider Notes (Signed)
[facility] ____________________________________________  Time seen: Approximately 11:34 AM  I have reviewed the triage vital signs and the nursing notes.   HISTORY  Chief Complaint Epistaxis   HPI Mihail Prettyman is a 72 y.o. male presenting for evaluation of nasal bleeding that started approximately 1.5 hours prior to arrival.  Patient and wife reports he had similar occurrence just over a month ago on March 23, 2018.  Denies trauma.  However does report prior to onset he was doing some strenuous activity of moving oriental rugs in their house.  Again no direct injury.  Denies recent sickness, cough or congestion symptoms.  Has applied pressure, no other alleviating measures attempted prior to arrival.  States large amount of bleeding including blood clots in the back of his throat and having to spit out.  Reports otherwise feels well.  No recent medication changes.  No other complaints.Denies recent sickness. Previously seen by Dr Ladene Artist for same post urgent care visit.   [name], MD: PCP   Past Medical History:  Diagnosis Date  . BPH (benign prostatic hyperplasia)   . Crohn disease ([place])   . Crohn's disease ([place])   . Glaucoma   . Glaucoma   . Gout   . History of chicken pox   . RBBB   . Right bundle branch block     Patient Active Problem List   Diagnosis Date Noted  . Goals of care, counseling/discussion 01/06/2018  . High triglycerides 10/14/2017  . 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 Renal insufficiency 07/23/2016  . 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 Right ankle pain 10/31/2015  . Hyperuricemia 09/05/2015  . Back pain with radiation 08/23/2015  . BPH (benign prostatic hyperplasia) 08/23/2015  . History of tobacco use 08/23/2015  . Glaucoma 08/23/2015  . Enlarged prostate without lower urinary tract symptoms (luts) 08/23/2015  . Elevated prostate specific antigen (PSA) 08/23/2015  . Dermatitis 08/23/2015  .  Crohn disease ([place]) 08/23/2015  . Other prurigo 08/23/2015  . Dorsalgia 08/23/2015  . Personal history of nicotine dependence 08/23/2015  . 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 Secondary polycythemia 03/28/2015  . 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 Right bundle-branch block 05/03/2012    Past Surgical History:  Procedure Laterality Date  . COLONOSCOPY WITH PROPOFOL N/A 03/27/2017   Procedure: COLONOSCOPY WITH PROPOFOL;  Surgeon: [name], MD;  Location: [facility] ENDOSCOPY;  Service: Endoscopy;  Laterality: N/A;  . PROSTATE SURGERY  2008   Prostate Biopsy in [place] to elevated PSA.  per patient normal  . Skin Lesion Basal cell removed    . wart removal     from eyelid      Current Facility-Administered Medications:  .  phenylephrine (NEO-SYNEPHRINE) 0.25 % nasal spray 1 spray, 1 spray, Left Nare, Once, Marylene Land, NP  Current Outpatient Medications:  .  allopurinol (ZYLOPRIM) 300 MG tablet, TAKE 1 TABLET BY MOUTH EVERY DAY, Disp: 90 tablet, Rfl: 0 .  aspirin 81 MG tablet, Take 81 mg by mouth daily., Disp: , Rfl:  .  Cholecalciferol (VITAMIN D3) 1000 units CAPS, Take 1 capsule by mouth daily., Disp: , Rfl:  .  Cyanocobalamin (RA VITAMIN B-12 TR) 1000 MCG TBCR, daily. , Disp: , Rfl:  .  ferrous sulfate 325 (65 FE) MG tablet, Take 325 mg by mouth 3 (three) times a week., Disp: , Rfl:  .  finasteride (PROSCAR) 5 MG tablet, Take 1 tablet (5 mg total) by mouth daily., Disp:  90 tablet, Rfl: 3 .  folic acid (FOLVITE) 1 MG tablet, TAKE 1 TABLET (1,000 MCG TOTAL) BY MOUTH ONCE DAILY. MUST KEEP APPT FOR FURTHER REFILLS., Disp: , Rfl:  .  JAKAFI 25 MG tablet, TAKE 1 TABLET (25 MG TOTAL) BY MOUTH 2 (TWO) TIMES DAILY. (Patient taking differently: TAKE 2 TABLET (50 MG TOTAL) BY MOUTH DAILY.), Disp: 60 tablet, Rfl: 5 .  latanoprost (XALATAN) 0.005 % ophthalmic solution, Place 1 drop into both eyes daily. , Disp: , Rfl:  .  Multiple Vitamins-Minerals (CENTRUM SILVER ULTRA  MENS) TABS, Take by mouth. Reported on 01/30/2016, Disp: , Rfl:  .  Omega-3 Fatty Acids (KP FISH OIL) 1200 MG CAPS, Take by mouth daily. , Disp: , Rfl:  .  sodium bicarbonate 650 MG tablet, Take 650 mg by mouth 2 (two) times daily., Disp: , Rfl:  .  timolol (BETIMOL) 0.25 % ophthalmic solution, Place 1 drop into both eyes daily. , Disp: , Rfl:   Allergies Patient has no known allergies.  Family History  Problem Relation Age of Onset  . Cancer Mother        Liver  . Cancer Father        Prostate  . Cancer Sister        Hodgkins lymphoma  . Cancer Paternal Aunt        Breast    Social History Social History   Tobacco Use  . Smoking status: Former Smoker    Packs/day: 0.25    Years: 20.00    Pack years: 5.00    Types: Cigarettes    Last attempt to quit: 09/28/2003    Years since quitting: 14.6  . Smokeless tobacco: Never Used  Substance Use Topics  . Alcohol use: Yes    Alcohol/week: 4.0 - 5.0 standard drinks    Types: 4 - 5 Glasses of wine per week  . Drug use: No    Review of Systems Constitutional: No fever ENT: No sore throat. Epistaxis.  Cardiovascular: Denies chest pain. Respiratory: Denies shortness of breath. Skin: Negative for rash.  ____________________________________________   PHYSICAL EXAM:  VITAL SIGNS: ED Triage Vitals  Enc Vitals Group     BP 05/05/18 1102 (!) 164/91     Pulse Rate 05/05/18 1102 76     Resp 05/05/18 1102 16     Temp 05/05/18 1102 (!) 97.4 F (36.3 C)     Temp Source 05/05/18 1102 Oral     SpO2 05/05/18 1102 100 %     Weight 05/05/18 1103 184 lb 15.5 oz (83.9 kg)     Height 05/05/18 1103 6' (1.829 m)     Head Circumference --      Peak Flow --      Pain Score 05/05/18 1103 0     Pain Loc --      Pain Edu? --      Excl. in [place]? --     Constitutional: Alert and oriented. Eyes: Conjunctivae are normal. ENT      Head: Normocephalic and atraumatic.No facial tenderness.       Nose: Blood present in bilateral nares, appears  actively bleeding from left nare.  Post Neo-Synephrine nasal spray, no visible site of epistaxis, bilateral nares patent.      Mouth/Throat: Mucous membranes are moist.Oropharynx non-erythematous. Cardiovascular: Normal rate, regular rhythm. Grossly normal heart sounds.  Good peripheral circulation. Respiratory: Normal respiratory effort without tachypnea nor retractions. Breath sounds are clear and equal bilaterally. No wheezes, rales, rhonchi. Musculoskeletal:  Steady gait.  Neurologic:  Normal speech and language.  Skin:  Skin is warm, dry Psychiatric: Mood and affect are normal. Speech and behavior are normal. Patient exhibits appropriate insight and judgment   ___________________________________________   LABS (all labs ordered are listed, but only abnormal results are displayed)  Labs Reviewed - No data to display  PROCEDURES Procedures    INITIAL IMPRESSION / ASSESSMENT AND PLAN / ED COURSE  Pertinent labs & imaging results that were available during my care of the patient were reviewed by me and considered in my medical decision making (see chart for details).  Overall well-appearing patient.  Wife at bedside.  Epistaxis that occurred 1.5 hours prior to arrival.  History of similar in the past that required packing and ENT follow-up.  No trauma. Some strenuous activity prior to onset.  Continued bleeding in urgent care with production of large clots as well as pharyngeal drainage.  Neo-Synephrine utilized and no more active bleeding noted.  However unable to visualize site of bleed and recommend ENT further evaluation, as appears posterior.  Nursing staff contacted the ENT Dr. Ladene Artist, who agreed to work patient in.  Patient taken upstairs to ENT.  Patient verbalized understanding and agreed to plan.   ____________________________________________   FINAL CLINICAL IMPRESSION(S) / ED DIAGNOSES  Final diagnoses:  Left-sided epistaxis     ED Discharge Orders    None        Note: This dictation was prepared with Dragon dictation along with smaller phrase technology. Any transcriptional errors that result from this process are unintentional.         Marylene Land, NP 05/05/18 1159

## 2018-05-05 NOTE — ED Triage Notes (Signed)
Patient's wife states that it has been bleeding for 1 hour and 35 minutes.

## 2018-05-10 ENCOUNTER — Other Ambulatory Visit: Payer: Self-pay | Admitting: Urgent Care

## 2018-05-10 DIAGNOSIS — H2513 Age-related nuclear cataract, bilateral: Secondary | ICD-10-CM | POA: Diagnosis not present

## 2018-05-12 ENCOUNTER — Other Ambulatory Visit: Payer: Self-pay | Admitting: Urgent Care

## 2018-05-12 DIAGNOSIS — D45 Polycythemia vera: Secondary | ICD-10-CM

## 2018-05-20 MED FILL — JAKAFI 25 MG TABLET: 25 | 30 days supply | Qty: 60 | Fill #0

## 2018-05-21 ENCOUNTER — Encounter: Payer: Self-pay | Admitting: Hematology and Oncology

## 2018-06-18 DIAGNOSIS — H2512 Age-related nuclear cataract, left eye: Secondary | ICD-10-CM | POA: Diagnosis not present

## 2018-06-21 MED FILL — JAKAFI 25 MG TABLET: 25 | 30 days supply | Qty: 60 | Fill #1

## 2018-06-23 DIAGNOSIS — Z23 Encounter for immunization: Secondary | ICD-10-CM | POA: Diagnosis not present

## 2018-06-30 ENCOUNTER — Encounter: Payer: Self-pay | Admitting: *Deleted

## 2018-07-07 ENCOUNTER — Other Ambulatory Visit: Payer: Self-pay | Admitting: Hematology and Oncology

## 2018-07-07 ENCOUNTER — Inpatient Hospital Stay: Payer: Medicare Other | Attending: Hematology and Oncology | Admitting: Hematology and Oncology

## 2018-07-07 ENCOUNTER — Encounter: Payer: Self-pay | Admitting: Hematology and Oncology

## 2018-07-07 ENCOUNTER — Inpatient Hospital Stay: Payer: Medicare Other

## 2018-07-07 VITALS — BP 131/75 | HR 60 | Temp 94.6°F | Resp 18 | Wt 185.8 lb

## 2018-07-07 DIAGNOSIS — Z79899 Other long term (current) drug therapy: Secondary | ICD-10-CM | POA: Insufficient documentation

## 2018-07-07 DIAGNOSIS — N400 Enlarged prostate without lower urinary tract symptoms: Secondary | ICD-10-CM

## 2018-07-07 DIAGNOSIS — R040 Epistaxis: Secondary | ICD-10-CM

## 2018-07-07 DIAGNOSIS — N1330 Unspecified hydronephrosis: Secondary | ICD-10-CM | POA: Insufficient documentation

## 2018-07-07 DIAGNOSIS — D45 Polycythemia vera: Secondary | ICD-10-CM

## 2018-07-07 DIAGNOSIS — Z87891 Personal history of nicotine dependence: Secondary | ICD-10-CM

## 2018-07-07 DIAGNOSIS — K5090 Crohn's disease, unspecified, without complications: Secondary | ICD-10-CM | POA: Insufficient documentation

## 2018-07-07 DIAGNOSIS — D7581 Myelofibrosis: Secondary | ICD-10-CM

## 2018-07-07 DIAGNOSIS — Z8719 Personal history of other diseases of the digestive system: Secondary | ICD-10-CM | POA: Insufficient documentation

## 2018-07-07 DIAGNOSIS — N189 Chronic kidney disease, unspecified: Secondary | ICD-10-CM

## 2018-07-07 DIAGNOSIS — E611 Iron deficiency: Secondary | ICD-10-CM | POA: Diagnosis not present

## 2018-07-07 DIAGNOSIS — N401 Enlarged prostate with lower urinary tract symptoms: Secondary | ICD-10-CM | POA: Diagnosis not present

## 2018-07-07 DIAGNOSIS — E790 Hyperuricemia without signs of inflammatory arthritis and tophaceous disease: Secondary | ICD-10-CM

## 2018-07-07 LAB — CBC WITH DIFFERENTIAL/PLATELET
BAND NEUTROPHILS: 13 %
BASOS ABS: 0.5 10*3/uL — AB (ref 0.0–0.1)
Basophils Relative: 3 %
Blasts: 2 %
Eosinophils Absolute: 0.4 10*3/uL (ref 0.0–0.5)
Eosinophils Relative: 2 %
HEMATOCRIT: 37.6 % — AB (ref 39.0–52.0)
Hemoglobin: 11.7 g/dL — ABNORMAL LOW (ref 13.0–17.0)
LYMPHS PCT: 9 %
Lymphs Abs: 1.6 10*3/uL (ref 0.7–4.0)
MCH: 26.5 pg (ref 26.0–34.0)
MCHC: 31.1 g/dL (ref 30.0–36.0)
MCV: 85.3 fL (ref 80.0–100.0)
MYELOCYTES: 0 %
Metamyelocytes Relative: 0 %
Monocytes Absolute: 2.5 10*3/uL — ABNORMAL HIGH (ref 0.1–1.0)
Monocytes Relative: 14 %
NEUTROS ABS: 12.5 10*3/uL — AB (ref 1.7–7.7)
NEUTROS PCT: 57 %
NRBC: 0.9 % — AB (ref 0.0–0.2)
NRBC: 1 /100{WBCs} — AB
Other: 0 %
PROMYELOCYTES RELATIVE: 0 %
Platelets: 168 10*3/uL (ref 150–400)
RBC: 4.41 MIL/uL (ref 4.22–5.81)
RDW: 21.1 % — AB (ref 11.5–15.5)
WBC: 17.8 10*3/uL — ABNORMAL HIGH (ref 4.0–10.5)

## 2018-07-07 LAB — COMPREHENSIVE METABOLIC PANEL
ALT: 15 U/L (ref 0–44)
ANION GAP: 8 (ref 5–15)
AST: 38 U/L (ref 15–41)
Albumin: 4.4 g/dL (ref 3.5–5.0)
Alkaline Phosphatase: 56 U/L (ref 38–126)
BUN: 25 mg/dL — ABNORMAL HIGH (ref 8–23)
CHLORIDE: 101 mmol/L (ref 98–111)
CO2: 24 mmol/L (ref 22–32)
CREATININE: 1.52 mg/dL — AB (ref 0.61–1.24)
Calcium: 9.3 mg/dL (ref 8.9–10.3)
GFR calc non Af Amer: 44 mL/min — ABNORMAL LOW (ref >60)
GFR, EST AFRICAN AMERICAN: 51 mL/min — AB (ref >60)
Glucose, Bld: 118 mg/dL — ABNORMAL HIGH (ref 70–99)
POTASSIUM: 4 mmol/L (ref 3.5–5.1)
SODIUM: 133 mmol/L — AB (ref 135–145)
Total Bilirubin: 0.7 mg/dL (ref 0.3–1.2)
Total Protein: 8.6 g/dL — ABNORMAL HIGH (ref 6.5–8.1)

## 2018-07-07 LAB — LACTATE DEHYDROGENASE: LDH: 671 U/L — AB (ref 98–192)

## 2018-07-07 LAB — URIC ACID: URIC ACID, SERUM: 5 mg/dL (ref 3.7–8.6)

## 2018-07-07 LAB — FERRITIN: Ferritin: 29 ng/mL (ref 24–336)

## 2018-07-07 NOTE — Progress Notes (Signed)
[facility] day:  07/07/2018  Chief Complaint: Ruben [name] is a 72 y.o. 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  HPI: The patient was last seen in the medical oncology clinic on 04/07/2018.  At that time, he was doing well. He had recent prolonged epistaxis event that resolved with nasal packing. Exam was stable. WBC was 18,800 (ANC 15,600). Hemoglobin was 10.9, hematocrit 33.1, MCV 83.1, and platelets 170,000.  BUN 29 and creatinine 1.43 (CrCl 52 mL/min). LDH was 616. Uric acid was normal at 4.9.  During the interim, he states that he underwent nasal packing x 2 within 5 weeks.  He underwent cautery with the second event.  Bleeding occurred from the left nares.  He has not been taking aspiring since the nose bleed.   Past Medical History:  Diagnosis Date  . Blood dyscrasia   . BPH (benign prostatic hyperplasia)   . Cancer ([place])    SKIN/ POLYCYTHEMIA VERA  . Chronic kidney disease    RENAL INSUFF  . Crohn disease ([place])   . Crohn's disease ([place])   . Glaucoma   . Glaucoma   . Gout   . History of chicken pox   . Nosebleed   . RBBB   . Right bundle branch block     Past Surgical History:  Procedure Laterality Date  . CATARACT EXTRACTION W/PHACO Left 07/13/2018   Procedure: CATARACT EXTRACTION PHACO AND INTRAOCULAR LENS PLACEMENT (IOC);  Surgeon: [name], MD;  Location: [facility] ORS;  Service: Ophthalmology;  Laterality: Left;  [place] 00:39  CDE 5.14 Fluid pack lot # [id number]  . CATARACT EXTRACTION W/PHACO Right 08/03/2018   Procedure: CATARACT EXTRACTION PHACO AND INTRAOCULAR LENS PLACEMENT (IOC);  Surgeon: [name], MD;  Location: [facility] ORS;  Service: Ophthalmology;  Laterality: Right;  [place] 00:43.4 CDE 5.23 Fluid pack Lot # [id number]  . COLONOSCOPY WITH PROPOFOL N/A 03/27/2017   Procedure: COLONOSCOPY WITH PROPOFOL;  Surgeon: [name], MD;  Location: [facility] ENDOSCOPY;  Service:  Endoscopy;  Laterality: N/A;  . MOHS SURGERY     EAR  . PROSTATE SURGERY  2008   Prostate Biopsy in [place] to elevated PSA.  per patient normal  . Skin Lesion Basal cell removed    . wart removal     from eyelid    Family History  Problem Relation Age of Onset  . Cancer Mother        Liver  . Cancer Father        Prostate  . Cancer Sister        Hodgkins lymphoma  . Cancer Paternal Aunt        Breast    Social History:  reports that he quit smoking about 14 years ago. His smoking use included cigarettes. He has a 5.00 pack-year smoking history. He has never used smokeless tobacco. He reports current alcohol use of about 4.0 - 5.0 standard drinks of alcohol per week. He reports that he does not use drugs.  He previously worked for [employer].  He volunteers for [employer].  The patient is accompanied by his wife, [name], today.  Allergies: No Known Allergies  Current Medications: Current Outpatient Medications  Medication Sig Dispense Refill  . acetaminophen (TYLENOL) 500 MG tablet Take 500 mg by mouth daily as needed for moderate pain.    . Cholecalciferol (VITAMIN D3) 1000 units CAPS Take 1,000 Units by mouth daily at 12  noon.     . Cyanocobalamin (RA VITAMIN B-12 TR) 1000 MCG TBCR Take 1,000 mcg by mouth daily at 12 noon.     . ferrous sulfate 325 (65 FE) MG tablet Take 325 mg by mouth every Monday, Wednesday, and Friday.     . folic acid (FOLVITE) 1 MG tablet Take 1 mg by mouth daily at 12 noon.     Marland Kitchen JAKAFI 25 MG tablet TAKE 1 TABLET (25 MG TOTAL) BY MOUTH 2 TIMES DAILY. (Patient taking differently: Take 25 mg by mouth 2 (two) times daily. ) 60 tablet 5  . latanoprost (XALATAN) 0.005 % ophthalmic solution Place 1 drop into both eyes at bedtime.     . Multiple Vitamins-Minerals (CENTRUM SILVER ULTRA MENS) TABS Take 1 tablet by mouth daily at 12 noon.     . Omega-3 Fatty Acids (KP FISH OIL) 1200 MG CAPS Take 1,200 mg by mouth daily.     . sodium bicarbonate  650 MG tablet Take 650 mg by mouth 2 (two) times daily.    . timolol (TIMOPTIC) 0.5 % ophthalmic solution Place 1 drop into both eyes daily.  5  . allopurinol (ZYLOPRIM) 300 MG tablet TAKE 1 TABLET BY MOUTH EVERY DAY 90 tablet 0  . finasteride (PROSCAR) 5 MG tablet Take 1 tablet (5 mg total) by mouth every evening. 90 tablet 3   No current facility-administered medications for this visit.     Review of Systems  Constitutional: Negative for chills, diaphoresis, fever, malaise/fatigue and weight loss (up 1 pound).       Doing well.  HENT: Positive for nosebleeds (x 2 with interval packing and cautery). Negative for ear discharge, ear pain, sinus pain, sore throat and tinnitus.   Eyes: Negative.  Negative for double vision, photophobia, pain and discharge.  Respiratory: Negative for cough, hemoptysis, sputum production and shortness of breath.   Cardiovascular: Negative.  Negative for chest pain, palpitations, orthopnea, leg swelling and PND.  Gastrointestinal: Negative.  Negative for abdominal pain, blood in stool, constipation, diarrhea, heartburn, melena, nausea and vomiting.  Genitourinary: Negative for dysuria, frequency, hematuria and urgency.       Urinary catheterization (self) 4x/day  Musculoskeletal: Negative for back pain, falls, joint pain and myalgias.  Skin: Negative.  Negative for itching and rash.  Neurological: Negative for dizziness, tremors, sensory change, focal weakness and weakness.  Endo/Heme/Allergies: Does not bruise/bleed easily.  Psychiatric/Behavioral: Negative for depression and memory loss. The patient is not nervous/anxious and does not have insomnia.   All other systems reviewed and are negative.  Performance status (ECOG): 1 - Symptomatic but completely ambulatory  Vital Signs BP 131/75 (BP Location: Left Arm, Patient Position: Sitting)   Pulse 60   Temp (!) 94.6 F (34.8 C) (Tympanic)   Resp 18   Wt 185 lb 13.6 oz (84.3 kg)   BMI 25.21 kg/m    Physical Exam  Constitutional: He is oriented to person, place, and time and well-developed, well-nourished, and in no distress. No distress.  HENT:  Head: Normocephalic and atraumatic.  Mouth/Throat: Oropharynx is clear and moist. No oropharyngeal exudate.  Gray hair.  Eyes: Pupils are equal, round, and reactive to light. Conjunctivae and EOM are normal. No scleral icterus.  Glasses.  Blue eyes.  Neck: Normal range of motion. Neck supple. No JVD present.  Cardiovascular: Normal rate, regular rhythm and normal heart sounds. Exam reveals no gallop and no friction rub.  No murmur heard. Pulmonary/Chest: Effort normal and breath sounds normal. No respiratory  distress. He has no wheezes. He has no rales.  Abdominal: Soft. Bowel sounds are normal. He exhibits no distension and no mass. There is no abdominal tenderness. There is no rebound and no guarding.  Musculoskeletal: Normal range of motion.        General: No tenderness or edema.  Lymphadenopathy:    He has no cervical adenopathy.    He has no axillary adenopathy.       Right: No inguinal and no supraclavicular adenopathy present.       Left: No inguinal and no supraclavicular adenopathy present.  Neurological: He is alert and oriented to person, place, and time. Gait normal.  Skin: Skin is warm and dry. No rash noted. He is not diaphoretic. No erythema.  Psychiatric: Mood, affect and judgment normal.  Nursing note and vitals reviewed.   Appointment on 07/07/2018  Component Date Value Ref Range Status  . Ferritin 07/07/2018 29  24 - 336 ng/mL Final   Performed at [facility], [address] [place], [place] [address]  . Prostatic Specific Antigen 07/07/2018 0.57  0.00 - 4.00 ng/mL Final   Comment: (NOTE) While PSA levels of <=4.0 ng/ml are reported as reference range, some men with levels below 4.0 ng/ml can have prostate cancer and many men with PSA above 4.0 ng/ml do not have prostate cancer.  Other tests such as  free PSA, age specific reference ranges, PSA velocity and PSA doubling time may be helpful especially in men less than 68 years old. Performed at [facility], [place] [address] [place], [place] [address]   . Uric Acid, Serum 07/07/2018 5.0  3.7 - 8.6 mg/dL Final   Performed at [facility], [address] [place], [place] [address]  . LDH 07/07/2018 671* 98 - 192 U/L Final   Performed at [facility], [address] [place], [place] [address]  . Sodium 07/07/2018 133* 135 - 145 mmol/L Final  . Potassium 07/07/2018 4.0  3.5 - 5.1 mmol/L Final  . Chloride 07/07/2018 101  98 - 111 mmol/L Final  . CO2 07/07/2018 24  22 - 32 mmol/L Final  . Glucose, Bld 07/07/2018 118* 70 - 99 mg/dL Final  . BUN 07/07/2018 25* 8 - 23 mg/dL Final  . Creatinine, Ser 07/07/2018 1.52* 0.61 - 1.24 mg/dL Final  . Calcium 07/07/2018 9.3  8.9 - 10.3 mg/dL Final  . Total Protein 07/07/2018 8.6* 6.5 - 8.1 g/dL Final  . Albumin 07/07/2018 4.4  3.5 - 5.0 g/dL Final  . AST 07/07/2018 38  15 - 41 U/L Final  . ALT 07/07/2018 15  0 - 44 U/L Final  . Alkaline Phosphatase 07/07/2018 56  38 - 126 U/L Final  . Total Bilirubin 07/07/2018 0.7  0.3 - 1.2 mg/dL Final  . GFR calc non Af Amer 07/07/2018 44* >60 mL/min Final  . GFR calc Af Amer 07/07/2018 51* >60 mL/min Final   Comment: (NOTE) The eGFR has been calculated using the CKD EPI equation. This calculation has not been validated in all clinical situations. eGFR's persistently <60 mL/min signify possible Chronic Kidney Disease.   Georgiann Hahn gap 07/07/2018 8  5 - 15 Final   Performed at [facility], [address], [place],  [address]  . WBC 07/07/2018 17.8* 4.0 - 10.5 K/uL Final  . RBC 07/07/2018 4.41  4.22 - 5.81 MIL/uL Final  . Hemoglobin 07/07/2018 11.7* 13.0 - 17.0 g/dL Final  . HCT 07/07/2018 37.6*  39.0 - 52.0 % Final  . MCV 07/07/2018 85.3  80.0 - 100.0 fL Final  . MCH 07/07/2018 26.5  26.0 - 34.0 pg Final  .  MCHC 07/07/2018 31.1  30.0 - 36.0 g/dL Final  . RDW 07/07/2018 21.1* 11.5 - 15.5 % Final  . Platelets 07/07/2018 168  150 - 400 K/uL Final  . nRBC 07/07/2018 0.9* 0.0 - 0.2 % Final  . Neutrophils Relative % 07/07/2018 57  % Final  . Band Neutrophils 07/07/2018 13  % Final  . Lymphocytes Relative 07/07/2018 9  % Final  . Monocytes Relative 07/07/2018 14  % Final  . Eosinophils Relative 07/07/2018 2  % Final  . Basophils Relative 07/07/2018 3  % Final  . nRBC 07/07/2018 1* 0 /100 WBC Final  . Blasts 07/07/2018 2  % Final  . Metamyelocytes Relative 07/07/2018 0  % Final  . Myelocytes 07/07/2018 0  % Final  . Promyelocytes Relative 07/07/2018 0  % Final  . Other 07/07/2018 0  % Final  . Neutro Abs 07/07/2018 12.5* 1.7 - 7.7 K/uL Final  . Lymphs Abs 07/07/2018 1.6  0.7 - 4.0 K/uL Final  . Monocytes Absolute 07/07/2018 2.5* 0.1 - 1.0 K/uL Final  . Eosinophils Absolute 07/07/2018 0.4  0.0 - 0.5 K/uL Final  . Basophils Absolute 07/07/2018 0.5* 0.0 - 0.1 K/uL Final  . RBC Morphology 07/07/2018 TEARDROP CELLS   Corrected   Comment: POLYCHROMASIA PRESENT ELLIPTOCYTES Performed at [facility], [address] [place], [place] [address]   . Path Review 07/07/2018    Final                   Value:Blood smear and history reviewed. 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  2017 bone marrow sampling at [facility] showed 95% cellularity, fibrosis, and IDH2, JAK2, RUNX1, and SRSF2 mutations, consistent with clonal evolution. Platelets are  adequate, with giant abnormal platelets and circulating micromegakaryocytes. Nucleated RBCs are noted. There is leukocytosis with increased granulocytes and monocytes, mostly mature neutrophils, with occastional myelocytes and a rare blast. Basophils are  increased. Hypogranular neutrophils are noted. 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    Comment: Reviewed by Lemmie Evens. Dicie Beam,  MD. 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  He has had polycythemia dating back to 2013.  Hematocrit was 62.1 with a hemoglobin of 19.8 on 03/28/2015.  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  Erythropoietin level was 1.1 (low).  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  He began a phlebotomy program on 03/28/2015 to maintain a hematocrit goal of < 45.  He last underwent phlebotomy on 05/02/2015.  He is on a baby aspirin.  He is followed by GI (Dr. Vira Agar) for a history of polyps and Crohn's disease.  Last colonoscopy was 3-4 years ago.  He has become iron deficient on his phlebotomy program (ferritin 21 on 11/28/2015).   CBC on 11/28/2015 revealed a hematocrit of 39.3, hemoglobin 11.9, platelets 463,000, WBC 32,000 with an ANC of 25,000.  Differential included 71% segs, 4% lymphocytes and 17% monocytes.  Peripheral smear revealed leukocytosis with predominantly mature neutrophils, increased monocytes and rare blasts (<1%).  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  Marrow was  packed (95-100% cellularity) with pan myelosis, multi-lineage dyspoiesis, and no significant increase in blasts.  There was moderate to focally marked reticulin fibrosis (grade 2-3/3).  Storage iron was not identified.  Flow cytometry revealed non-specific atypical myeloid findings with no increase in blasts.  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  Cytogenetics were normal (46, XY).  Bone marrow on 04/07/2016 at [facility] revealed a hypercellular marrow (> 95%) with persistent involvement by myeloid neoplasm with 5% blasts.  There was mild reticulin fibrosis.  FISH t(9;22) results were normal.  Myeloid mutation  panel revealed JAK2 V617F, IDH2, RUNX1, and SRSF2 consistent with clonal evolution.  Cytogenetics are pending.  He was briefly on hydroxyurea 500 mg a day (12/05/2015 - 12/19/2015 and 06/25/2016 - 07/03/2016).   Platelet count increased to 1.18 million and white count to 54,800 without increased blasts on 06/25/2016.  CBC on 07/02/2016 revealed a platelets of 429,000 with a white count 30,600.  He began allopurinol on 12/19/2015.  He began Jakafi 20 mg BID on 05/28/2016.  Jakafi was increased to 25 mg BID on 05/26/2017.  LDH is followed:  466 on 01/02/2016, 596 on 05/23/2016, 663 on 07/30/2016, 522 on 09/24/2016, 638 on 01/05/2017, 625 on 02/25/2017, 1144 on 06/10/2017, 720 on 06/17/2017, 728 on 06/24/2017, and 724 on 01/06/2018.  Triglycerides were 183 (< 150) on 08/20/2016, 180 on 01/05/2017, 312 on 06/17/2017, 165 on 11/25/2017, 113 on 02/17/2018.  He has urinary retention.  Renal ultrasound on 07/28/2016 revealed bilateral hydronephrosis (right > left) and a large postvoid residual (2 liters) suggesting bladder dysfunction or outlet obstruction.  He underwent temporary Foley catheter placement.  He performs I/O self catheterizations.  He is on Flomax and finasteride.  PSA was 2.02 on 07/09/2016.  He has chronic renal insufficiency (Cr 1.85; CrCl 36 ml/min).  SPEP on 09/23/2016 was negative.  Spot urine revealed 17.4% of 27.3 mg/dL.of a monoclonal protein.  24 hour urine on 10/16/2016 revealed no monoclonal protein.  Renal function transiently decreased in 06/2017 after Bactrim.  He is followed by nephrology and urology.  Symptomatically, he is doing well.  He has had significant epistaxis requiring packing x 2 and cautery on the left.  Exam reveals no adenopathy or hepatosplenomegaly. Hematocrit os 37.6 and hemoglobin 11.7.  Platelets 168,000.  WBC 17,8000 with an ANC of 12,500.  LDH is 671 (stable).  Plan: 1. Labs today:  CBC with diff, CMP, LDH, uric acid, ferritin, PSA. 2.  Polycythemia rubra  vera:   Clinically stable on Jakafi.   Patient has follow-up appointment with Dr. Evelene Croon on 07/12/2018. 3.  Iron deficiency:   Patient on Monday, Wednesday, and Friday iron.   Hemoglobin has improved.   Check ferritin today. 4.  Epistaxis:   Interval significant epistaxis requiring packing x 2 and cautery.   No other excess bruising or bleeding.   Suspect anatomic issue rather than bleeding diathesis.   Currently off aspirin. 5.  Health maintenance:   Known BPH.  Last PSA 2017.  Recheck today. 6.  RTC in 3 months for MD assessment and labs (CBC with diff, CMP, LDH, uric acid, ferritin).   Lequita Asal, MD  07/07/2018, 4:35 PM

## 2018-07-07 NOTE — Progress Notes (Signed)
Patient offers no complaints today. 

## 2018-07-08 LAB — PSA: Prostatic Specific Antigen: 0.57 ng/mL (ref 0.00–4.00)

## 2018-07-08 LAB — PATHOLOGIST SMEAR REVIEW

## 2018-07-12 ENCOUNTER — Ambulatory Visit: Admit: 2018-07-12 | Discharge: 2018-07-13 | Payer: MEDICARE | Attending: Adult Health | Primary: Adult Health

## 2018-07-12 ENCOUNTER — Other Ambulatory Visit: Admit: 2018-07-12 | Discharge: 2018-07-13 | Payer: MEDICARE

## 2018-07-12 DIAGNOSIS — D509 Iron deficiency anemia, unspecified: Secondary | ICD-10-CM | POA: Diagnosis not present

## 2018-07-12 DIAGNOSIS — K5010 Crohn's disease of large intestine without complications: Secondary | ICD-10-CM | POA: Diagnosis not present

## 2018-07-12 DIAGNOSIS — Z87891 Personal history of nicotine dependence: Secondary | ICD-10-CM | POA: Diagnosis not present

## 2018-07-12 DIAGNOSIS — D45 Polycythemia vera: Secondary | ICD-10-CM | POA: Diagnosis not present

## 2018-07-12 DIAGNOSIS — Z7189 Other specified counseling: Secondary | ICD-10-CM | POA: Diagnosis not present

## 2018-07-12 DIAGNOSIS — Z7982 Long term (current) use of aspirin: Secondary | ICD-10-CM | POA: Diagnosis not present

## 2018-07-12 DIAGNOSIS — N183 Chronic kidney disease, stage 3 (moderate): Secondary | ICD-10-CM | POA: Diagnosis not present

## 2018-07-12 DIAGNOSIS — D471 Chronic myeloproliferative disease: Secondary | ICD-10-CM | POA: Diagnosis not present

## 2018-07-12 DIAGNOSIS — D631 Anemia in chronic kidney disease: Secondary | ICD-10-CM | POA: Diagnosis not present

## 2018-07-12 DIAGNOSIS — Z79899 Other long term (current) drug therapy: Secondary | ICD-10-CM | POA: Diagnosis not present

## 2018-07-13 ENCOUNTER — Encounter
Admission: RE | Disposition: A | Discharge status: Home / Self Care | Payer: Self-pay | Source: Ambulatory Visit | Attending: Ophthalmology

## 2018-07-13 ENCOUNTER — Encounter: Payer: Self-pay | Admitting: Anesthesiology

## 2018-07-13 ENCOUNTER — Ambulatory Visit: Payer: Medicare Other | Admitting: Anesthesiology

## 2018-07-13 ENCOUNTER — Other Ambulatory Visit: Payer: Self-pay

## 2018-07-13 ENCOUNTER — Ambulatory Visit
Admission: RE | Admit: 2018-07-13 | Discharge: 2018-07-13 | Disposition: A | Discharge status: Home / Self Care | Payer: Medicare Other | Source: Ambulatory Visit | Attending: Ophthalmology | Admitting: Ophthalmology

## 2018-07-13 DIAGNOSIS — Z87891 Personal history of nicotine dependence: Secondary | ICD-10-CM | POA: Diagnosis not present

## 2018-07-13 DIAGNOSIS — Z85828 Personal history of other malignant neoplasm of skin: Secondary | ICD-10-CM | POA: Diagnosis not present

## 2018-07-13 DIAGNOSIS — Z79899 Other long term (current) drug therapy: Secondary | ICD-10-CM | POA: Insufficient documentation

## 2018-07-13 DIAGNOSIS — N400 Enlarged prostate without lower urinary tract symptoms: Secondary | ICD-10-CM | POA: Diagnosis not present

## 2018-07-13 DIAGNOSIS — M109 Gout, unspecified: Secondary | ICD-10-CM | POA: Diagnosis not present

## 2018-07-13 DIAGNOSIS — D45 Polycythemia vera: Secondary | ICD-10-CM | POA: Insufficient documentation

## 2018-07-13 DIAGNOSIS — H2512 Age-related nuclear cataract, left eye: Secondary | ICD-10-CM | POA: Diagnosis not present

## 2018-07-13 DIAGNOSIS — D7581 Myelofibrosis: Secondary | ICD-10-CM | POA: Insufficient documentation

## 2018-07-13 HISTORY — DX: Chronic kidney disease, unspecified: N18.9

## 2018-07-13 HISTORY — DX: Malignant (primary) neoplasm, unspecified: C80.1

## 2018-07-13 HISTORY — DX: Disease of blood and blood-forming organs, unspecified: D75.9

## 2018-07-13 HISTORY — PX: CATARACT EXTRACTION W/PHACO: SHX586

## 2018-07-13 SURGERY — PHACOEMULSIFICATION, CATARACT, WITH IOL INSERTION
Anesthesia: Monitor Anesthesia Care | Site: Eye | Laterality: Left

## 2018-07-13 MED ORDER — EPINEPHRINE PF 1 MG/ML IJ SOLN
INTRAOCULAR | Status: DC | PRN
  Administered 2018-07-13: 1 mL via OPHTHALMIC

## 2018-07-13 MED ORDER — MOXIFLOXACIN HCL 0.5 % OP SOLN: 1.0000 [drp] | OPHTHALMIC | Status: DC | PRN

## 2018-07-13 MED ORDER — EPINEPHRINE PF 1 MG/ML IJ SOLN
INTRAMUSCULAR | Status: AC
  Filled 2018-07-13: qty 1

## 2018-07-13 MED ORDER — ARMC OPHTHALMIC DILATING DROPS
OPHTHALMIC | Status: AC
  Administered 2018-07-13: 1 via OPHTHALMIC
  Filled 2018-07-13: qty 0.5

## 2018-07-13 MED ORDER — LIDOCAINE HCL (PF) 4 % IJ SOLN
INTRAOCULAR | Status: DC | PRN
  Administered 2018-07-13: 2 mL via OPHTHALMIC

## 2018-07-13 MED ORDER — LIDOCAINE HCL (PF) 4 % IJ SOLN
INTRAMUSCULAR | Status: AC
  Filled 2018-07-13: qty 5

## 2018-07-13 MED ORDER — FENTANYL CITRATE (PF) 100 MCG/2ML IJ SOLN
INTRAMUSCULAR | Status: AC
  Filled 2018-07-13: qty 2

## 2018-07-13 MED ORDER — TETRACAINE HCL 0.5 % OP SOLN
OPHTHALMIC | Status: AC
  Administered 2018-07-13: 1 [drp] via OPHTHALMIC
  Filled 2018-07-13: qty 4

## 2018-07-13 MED ORDER — NA CHONDROIT SULF-NA HYALURON 40-17 MG/ML IO SOLN
INTRAOCULAR | Status: AC
  Filled 2018-07-13: qty 1

## 2018-07-13 MED ORDER — TETRACAINE HCL 0.5 % OP SOLN
1.0000 [drp] | OPHTHALMIC | Status: AC | PRN
  Administered 2018-07-13 (×3): 1 [drp] via OPHTHALMIC

## 2018-07-13 MED ORDER — ARMC OPHTHALMIC DILATING DROPS
1.0000 "application " | OPHTHALMIC | Status: AC
  Administered 2018-07-13 (×3): 1 via OPHTHALMIC

## 2018-07-13 MED ORDER — POVIDONE-IODINE 5 % OP SOLN
OPHTHALMIC | Status: AC
  Filled 2018-07-13: qty 30

## 2018-07-13 MED ORDER — NA CHONDROIT SULF-NA HYALURON 40-17 MG/ML IO SOLN
INTRAOCULAR | Status: DC | PRN
  Administered 2018-07-13: 1 mL via INTRAOCULAR

## 2018-07-13 MED ORDER — SODIUM CHLORIDE 0.9 % IV SOLN
INTRAVENOUS | Status: DC
  Administered 2018-07-13: 07:00:00 via INTRAVENOUS

## 2018-07-13 MED ORDER — MIDAZOLAM HCL 2 MG/2ML IJ SOLN
INTRAMUSCULAR | Status: DC | PRN
  Administered 2018-07-13 (×2): 1 mg via INTRAVENOUS

## 2018-07-13 MED ORDER — POVIDONE-IODINE 5 % OP SOLN
OPHTHALMIC | Status: DC | PRN
  Administered 2018-07-13: 1 via OPHTHALMIC

## 2018-07-13 MED ORDER — MIDAZOLAM HCL 2 MG/2ML IJ SOLN
INTRAMUSCULAR | Status: AC
  Filled 2018-07-13: qty 2

## 2018-07-13 MED ORDER — FENTANYL CITRATE (PF) 100 MCG/2ML IJ SOLN
INTRAMUSCULAR | Status: DC | PRN
  Administered 2018-07-13: 50 ug via INTRAVENOUS

## 2018-07-13 MED ORDER — MOXIFLOXACIN HCL 0.5 % OP SOLN
OPHTHALMIC | Status: DC | PRN
  Administered 2018-07-13: .2 mL via OPHTHALMIC

## 2018-07-13 MED ORDER — MOXIFLOXACIN HCL 0.5 % OP SOLN
OPHTHALMIC | Status: AC
  Filled 2018-07-13: qty 3

## 2018-07-13 MED ORDER — CARBACHOL 0.01 % IO SOLN
INTRAOCULAR | Status: DC | PRN
  Administered 2018-07-13: .5 mL via INTRAOCULAR

## 2018-07-13 SURGICAL SUPPLY — 18 items
GLOVE BIO SURGEON STRL SZ8 (GLOVE) ×3 IMPLANT
GLOVE BIOGEL M 6.5 STRL (GLOVE) ×3 IMPLANT
GLOVE SURG LX 8.0 MICRO (GLOVE) ×2
GLOVE SURG LX STRL 8.0 MICRO (GLOVE) ×1 IMPLANT
GOWN STRL REUS W/ TWL LRG LVL3 (GOWN DISPOSABLE) ×2 IMPLANT
GOWN STRL REUS W/TWL LRG LVL3 (GOWN DISPOSABLE) ×4
LABEL CATARACT MEDS ST (LABEL) ×3 IMPLANT
LENS IOL ACRSF IQ TRC 3 18.0 IMPLANT
LENS IOL ACRYSOF IQ TORIC 18.0 ×2 IMPLANT
LENS IOL IQ TORIC 3 18.0 ×1 IMPLANT
PACK CATARACT (MISCELLANEOUS) ×3 IMPLANT
PACK CATARACT BRASINGTON LX (MISCELLANEOUS) ×3 IMPLANT
PACK EYE AFTER SURG (MISCELLANEOUS) ×3 IMPLANT
SOL BSS BAG (MISCELLANEOUS) ×3
SOLUTION BSS BAG (MISCELLANEOUS) ×1 IMPLANT
SYR 5ML LL (SYRINGE) ×3 IMPLANT
WATER STERILE IRR 250ML POUR (IV SOLUTION) ×3 IMPLANT
WIPE NON LINTING 3.25X3.25 (MISCELLANEOUS) ×3 IMPLANT

## 2018-07-13 NOTE — Anesthesia Preprocedure Evaluation (Signed)
Anesthesia Evaluation  Patient identified by MRN, date of birth, ID band Patient awake    Reviewed: Allergy & Precautions, NPO status , Patient's Chart, lab work & pertinent test results  History of Anesthesia Complications Negative for: history of anesthetic complications  Airway Mallampati: II  TM Distance: >3 FB Neck ROM: Full    Dental no notable dental hx.    Pulmonary neg sleep apnea, neg COPD, former smoker,    breath sounds clear to auscultation- rhonchi (-) wheezing      Cardiovascular Exercise Tolerance: Good (-) hypertension(-) CAD, (-) Past MI, (-) Cardiac Stents and (-) CABG Dysrhythmias: RBBB.  Rhythm:Regular Rate:Normal - Systolic murmurs and - Diastolic murmurs    Neuro/Psych negative neurological ROS  negative psych ROS   GI/Hepatic negative GI ROS, Neg liver ROS,   Endo/Other  negative endocrine ROS  Renal/GU Renal InsufficiencyRenal disease     Musculoskeletal negative musculoskeletal ROS (+)   Abdominal (+) - obese,   Peds  Hematology negative hematology ROS (+)   Anesthesia Other Findings Past Medical History: No date: Blood dyscrasia No date: BPH (benign prostatic hyperplasia) No date: Cancer ([place])     Comment:  SKIN/ POLYCYTHEMIA VERA No date: Chronic kidney disease     Comment:  RENAL INSUFF No date: Crohn disease ([place]) No date: Crohn's disease ([facility]) No date: Glaucoma No date: Glaucoma No date: Gout No date: History of chicken pox No date: RBBB No date: Right bundle branch block   Reproductive/Obstetrics                             Anesthesia Physical Anesthesia Plan  ASA: II  Anesthesia Plan: MAC   Post-op Pain Management:    Induction: Intravenous  PONV Risk Score and Plan: 1 and Midazolam  Airway Management Planned: Natural Airway  Additional Equipment:   Intra-op Plan:   Post-operative Plan:   Informed Consent: I have reviewed the  patients History and Physical, chart, labs and discussed the procedure including the risks, benefits and alternatives for the proposed anesthesia with the patient or authorized representative who has indicated his/her understanding and acceptance.     Plan Discussed with: CRNA and Anesthesiologist  Anesthesia Plan Comments:         Anesthesia Quick Evaluation

## 2018-07-13 NOTE — Anesthesia Postprocedure Evaluation (Signed)
Anesthesia Post Note  Patient: Ruben [name]  Procedure(s) Performed: CATARACT EXTRACTION PHACO AND INTRAOCULAR LENS PLACEMENT ([place]) (Left Eye)  Patient location during evaluation: PACU Anesthesia Type: MAC Level of consciousness: awake and alert and oriented Pain management: pain level controlled Vital Signs Assessment: post-procedure vital signs reviewed and stable Respiratory status: spontaneous breathing, nonlabored ventilation and respiratory function stable Cardiovascular status: blood pressure returned to baseline and stable Postop Assessment: no signs of nausea or vomiting Anesthetic complications: no     Last Vitals:  Vitals:   07/13/18 0643 07/13/18 0816  BP: 126/72 129/62  Pulse:  (!) 59  Resp:  16  Temp:  (!) 35.8 C  SpO2:  100%    Last Pain:  Vitals:   07/13/18 0816  TempSrc: Temporal  PainSc: 0-No pain                 Ambers Iyengar

## 2018-07-13 NOTE — H&P (Signed)
All labs reviewed. Abnormal studies sent to patients PCP when indicated.  Previous H&P reviewed, patient examined, there are NO CHANGES.  Ruben [name] Porfilio10/22/20197:48 AM

## 2018-07-13 NOTE — Anesthesia Procedure Notes (Signed)
Procedure Name: MAC Performed by: Vaughan Sine Pre-anesthesia Checklist: Patient identified, Emergency Drugs available, Suction available, Patient being monitored and Timeout performed Patient Re-evaluated:Patient Re-evaluated prior to induction Oxygen Delivery Method: Nasal cannula Preoxygenation: Pre-oxygenation with 100% oxygen Induction Type: IV induction Placement Confirmation: positive ETCO2 and CO2 detector

## 2018-07-13 NOTE — Transfer of Care (Signed)
Immediate Anesthesia Transfer of Care Note  Patient: Ruben [name]  Procedure(s) Performed: CATARACT EXTRACTION PHACO AND INTRAOCULAR LENS PLACEMENT ([place]) (Left Eye)  Patient Location: PACU  Anesthesia Type:MAC  Level of Consciousness: awake, alert  and oriented  Airway & Oxygen Therapy: Patient Spontanous Breathing  Post-op Assessment: Report given to RN and Post -op Vital signs reviewed and stable  Post vital signs: Reviewed and stable  Last Vitals:  Vitals Value Taken Time  BP    Temp    Pulse    Resp    SpO2      Last Pain:  Vitals:   07/13/18 0640  TempSrc: Tympanic  PainSc: 0-No pain         Complications: No apparent anesthesia complications

## 2018-07-13 NOTE — Discharge Instructions (Addendum)
Eye Surgery Discharge Instructions  Expect mild scratchy sensation or mild soreness. DO NOT RUB YOUR EYE!  The day of surgery:  Minimal physical activity, but bed rest is not required  No reading, computer work, or close hand work  No bending, lifting, or straining.  May watch TV  For 24 hours:  No driving, legal decisions, or alcoholic beverages  Safety precautions  Eat anything you prefer: It is better to start with liquids, then soup then solid foods.  Solar shield eyeglasses should be worn for comfort in the sunlight/patch while sleeping  Resume all regular medications including aspirin or Coumadin if these were discontinued prior to surgery. You may shower, bathe, shave, or wash your hair. Tylenol may be taken for mild discomfort. F0LLOW DR. PORFILIO'S EYE DROP INSTRUCTION SHEET AS REVIEWED.  Call your doctor if you experience significant pain, nausea, or vomiting, fever > 101 or other signs of infection. [phone] or [phone] Specific instructions:  Follow-up Information    [name], MD Follow up.   Specialty:  Ophthalmology Why:  07/14/18 @ 9:45 AM Contact information: [address] [place] [address] [phone]

## 2018-07-13 NOTE — Op Note (Signed)
PREOPERATIVE DIAGNOSIS:  Nuclear sclerotic cataract of the left eye.   POSTOPERATIVE DIAGNOSIS:  Nuclear sclerotic cataract of the left eye.   OPERATIVE PROCEDURE: Procedure(s): CATARACT EXTRACTION PHACO AND INTRAOCULAR LENS PLACEMENT (IOC)   SURGEON:  [name], MD.   ANESTHESIA: 1.      Managed anesthesia care. 2.     0.19m os Shugarcaine was instilled following the paracentesis 2oranesstaff@   COMPLICATIONS:  None.   TECHNIQUE:   Stop and chop    DESCRIPTION OF PROCEDURE:  The patient was examined and consented in the preoperative holding area where the aforementioned topical anesthesia was applied to the left eye.  The patient was brought back to the Operating Room where he was sat upright on the gurney and given a target to fixate upon while the eye was marked at the 3:00 and 9:00 position.  The patient was then reclined on the operating table.  The eye was prepped and draped in the usual sterile ophthalmic fashion and a lid speculum was placed. A paracentesis was created with the side port blade and the anterior chamber was filled with viscoelastic. A near clear corneal incision was performed with the steel keratome. A continuous curvilinear capsulorrhexis was performed with a cystotome followed by the capsulorrhexis forceps. Hydrodissection and hydrodelineation were carried out with BSS on a blunt cannula. The lens was removed in a stop and chop technique and the remaining cortical material was removed with the irrigation-aspiration handpiece. The eye was inflated with viscoelastic and the Alcon lens was placed in the eye and rotated to within a few degrees of the predetermined orientation.  The remaining viscoelastic was removed from the eye.  The Sinskey hook was used to rotate the toric lens into its final resting place at 100 degrees.  0.1 ml of Vigamox was placed in the anterior chamber. The eye was inflated to a physiologic pressure and found to be watertight.  The eye was  dressed with Vigamox. The patient was given protective glasses to wear throughout the day and a shield with which to sleep tonight. The patient was also given drops with which to begin a drop regimen today and will follow-up with me in one day. Implant Name Type Inv. Item Serial No. Manufacturer Lot No. LRB No. Used  LENS IOL TORIC 18.0 - [id number]  LENS IOL TORIC 18.0 [id number] ALCON  Left 1   Procedure(s) with comments: CATARACT EXTRACTION PHACO AND INTRAOCULAR LENS PLACEMENT (IOC) (Left) - [place]:39  CDE 5.14 Fluid pack lot # [id number]  Electronically signed: WBirder Robson10/22/20198:14 AM

## 2018-07-13 NOTE — Anesthesia Post-op Follow-up Note (Signed)
Anesthesia QCDR form completed.        

## 2018-07-16 MED FILL — JAKAFI 25 MG TABLET: 25 | 30 days supply | Qty: 60 | Fill #2

## 2018-07-21 DIAGNOSIS — H2511 Age-related nuclear cataract, right eye: Secondary | ICD-10-CM | POA: Diagnosis not present

## 2018-07-23 ENCOUNTER — Telehealth: Payer: Self-pay | Admitting: Pharmacist

## 2018-07-23 NOTE — Telephone Encounter (Signed)
Oral Chemotherapy Pharmacist Encounter  Follow-Up Form  Called patient today to follow up regarding patient's oral chemotherapy medication: Jakafi (ruxolitinib)  Original Start date of oral chemotherapy: 05/2016  Pt reports 0 tablets/doses of Jakafi missed in the last month.   Pt reports the following side effects: None reported  Recent labs reviewed: CBC from 07/07/18  New medications?: None reported Other Issues: None reported  Patient knows to call the office with questions or concerns. Oral Oncology Clinic will continue to follow.  Darl Pikes, PharmD, BCPS, [facility] Hematology/Oncology Clinical Pharmacist [facility] [phone]  07/23/2018 10:47 AM

## 2018-07-28 ENCOUNTER — Encounter: Payer: Self-pay | Admitting: *Deleted

## 2018-08-03 ENCOUNTER — Ambulatory Visit: Payer: Medicare Other | Admitting: Anesthesiology

## 2018-08-03 ENCOUNTER — Other Ambulatory Visit: Payer: Self-pay

## 2018-08-03 ENCOUNTER — Ambulatory Visit
Admission: RE | Admit: 2018-08-03 | Discharge: 2018-08-03 | Disposition: A | Discharge status: Home / Self Care | Payer: Medicare Other | Source: Ambulatory Visit | Attending: Ophthalmology | Admitting: Ophthalmology

## 2018-08-03 ENCOUNTER — Encounter
Admission: RE | Disposition: A | Discharge status: Home / Self Care | Payer: Self-pay | Source: Ambulatory Visit | Attending: Ophthalmology

## 2018-08-03 ENCOUNTER — Encounter: Payer: Self-pay | Admitting: Anesthesiology

## 2018-08-03 DIAGNOSIS — H2511 Age-related nuclear cataract, right eye: Secondary | ICD-10-CM | POA: Diagnosis not present

## 2018-08-03 DIAGNOSIS — Z85828 Personal history of other malignant neoplasm of skin: Secondary | ICD-10-CM | POA: Diagnosis not present

## 2018-08-03 DIAGNOSIS — Z8719 Personal history of other diseases of the digestive system: Secondary | ICD-10-CM | POA: Diagnosis not present

## 2018-08-03 DIAGNOSIS — M109 Gout, unspecified: Secondary | ICD-10-CM | POA: Insufficient documentation

## 2018-08-03 DIAGNOSIS — Z87891 Personal history of nicotine dependence: Secondary | ICD-10-CM | POA: Insufficient documentation

## 2018-08-03 DIAGNOSIS — D45 Polycythemia vera: Secondary | ICD-10-CM | POA: Diagnosis not present

## 2018-08-03 DIAGNOSIS — H409 Unspecified glaucoma: Secondary | ICD-10-CM | POA: Diagnosis not present

## 2018-08-03 DIAGNOSIS — N189 Chronic kidney disease, unspecified: Secondary | ICD-10-CM | POA: Diagnosis not present

## 2018-08-03 HISTORY — DX: Epistaxis: R04.0

## 2018-08-03 HISTORY — PX: CATARACT EXTRACTION W/PHACO: SHX586

## 2018-08-03 SURGERY — PHACOEMULSIFICATION, CATARACT, WITH IOL INSERTION
Anesthesia: Monitor Anesthesia Care | Site: Eye | Laterality: Right

## 2018-08-03 MED ORDER — ARMC OPHTHALMIC DILATING DROPS
OPHTHALMIC | Status: AC
  Administered 2018-08-03: 1 via OPHTHALMIC
  Filled 2018-08-03: qty 0.5

## 2018-08-03 MED ORDER — POVIDONE-IODINE 5 % OP SOLN
OPHTHALMIC | Status: DC | PRN
  Administered 2018-08-03: 1 via OPHTHALMIC

## 2018-08-03 MED ORDER — LIDOCAINE HCL (PF) 4 % IJ SOLN
INTRAMUSCULAR | Status: AC
  Filled 2018-08-03: qty 5

## 2018-08-03 MED ORDER — FENTANYL CITRATE (PF) 100 MCG/2ML IJ SOLN
INTRAMUSCULAR | Status: DC | PRN
  Administered 2018-08-03: 50 ug via INTRAVENOUS

## 2018-08-03 MED ORDER — EPINEPHRINE PF 1 MG/ML IJ SOLN
INTRAOCULAR | Status: DC | PRN
  Administered 2018-08-03: 10:00:00 via OPHTHALMIC

## 2018-08-03 MED ORDER — TETRACAINE HCL 0.5 % OP SOLN
OPHTHALMIC | Status: AC
  Administered 2018-08-03: 1 [drp] via OPHTHALMIC
  Filled 2018-08-03: qty 4

## 2018-08-03 MED ORDER — FENTANYL CITRATE (PF) 100 MCG/2ML IJ SOLN
INTRAMUSCULAR | Status: AC
  Filled 2018-08-03: qty 2

## 2018-08-03 MED ORDER — MOXIFLOXACIN HCL 0.5 % OP SOLN
OPHTHALMIC | Status: DC | PRN
  Administered 2018-08-03: 0.2 mL via OPHTHALMIC

## 2018-08-03 MED ORDER — MOXIFLOXACIN HCL 0.5 % OP SOLN: 1.0000 [drp] | OPHTHALMIC | Status: DC | PRN

## 2018-08-03 MED ORDER — TETRACAINE HCL 0.5 % OP SOLN
1.0000 [drp] | OPHTHALMIC | Status: DC | PRN
  Administered 2018-08-03 (×2): 1 [drp] via OPHTHALMIC

## 2018-08-03 MED ORDER — POVIDONE-IODINE 5 % OP SOLN
OPHTHALMIC | Status: AC
  Filled 2018-08-03: qty 30

## 2018-08-03 MED ORDER — ARMC OPHTHALMIC DILATING DROPS
1.0000 "application " | OPHTHALMIC | Status: AC
  Administered 2018-08-03 (×3): 1 via OPHTHALMIC

## 2018-08-03 MED ORDER — NA CHONDROIT SULF-NA HYALURON 40-17 MG/ML IO SOLN
INTRAOCULAR | Status: DC | PRN
  Administered 2018-08-03: 1 mL via INTRAOCULAR

## 2018-08-03 MED ORDER — EPINEPHRINE PF 1 MG/ML IJ SOLN
INTRAMUSCULAR | Status: AC
  Filled 2018-08-03: qty 1

## 2018-08-03 MED ORDER — MIDAZOLAM HCL 2 MG/2ML IJ SOLN
INTRAMUSCULAR | Status: DC | PRN
  Administered 2018-08-03 (×2): 1 mg via INTRAVENOUS

## 2018-08-03 MED ORDER — SODIUM CHLORIDE 0.9 % IV SOLN
INTRAVENOUS | Status: DC
  Administered 2018-08-03: 09:00:00 via INTRAVENOUS

## 2018-08-03 MED ORDER — LIDOCAINE HCL (PF) 4 % IJ SOLN
INTRAOCULAR | Status: DC | PRN
  Administered 2018-08-03: 4 mL via OPHTHALMIC

## 2018-08-03 MED ORDER — CARBACHOL 0.01 % IO SOLN
INTRAOCULAR | Status: DC | PRN
  Administered 2018-08-03: 0.5 mL via INTRAOCULAR

## 2018-08-03 MED ORDER — MOXIFLOXACIN HCL 0.5 % OP SOLN
OPHTHALMIC | Status: AC
  Filled 2018-08-03: qty 3

## 2018-08-03 MED ORDER — NA CHONDROIT SULF-NA HYALURON 40-17 MG/ML IO SOLN
INTRAOCULAR | Status: AC
  Filled 2018-08-03: qty 1

## 2018-08-03 MED ORDER — MIDAZOLAM HCL 2 MG/2ML IJ SOLN
INTRAMUSCULAR | Status: AC
  Filled 2018-08-03: qty 2

## 2018-08-03 SURGICAL SUPPLY — 18 items
GLOVE BIO SURGEON STRL SZ8 (GLOVE) ×3 IMPLANT
GLOVE BIOGEL M 6.5 STRL (GLOVE) ×3 IMPLANT
GLOVE SURG LX 8.0 MICRO (GLOVE) ×2
GLOVE SURG LX STRL 8.0 MICRO (GLOVE) ×1 IMPLANT
GOWN STRL REUS W/ TWL LRG LVL3 (GOWN DISPOSABLE) ×2 IMPLANT
GOWN STRL REUS W/TWL LRG LVL3 (GOWN DISPOSABLE) ×4
LABEL CATARACT MEDS ST (LABEL) ×3 IMPLANT
LENS IOL ACRSF IQ TRC 3 18.0 ×1 IMPLANT
LENS IOL ACRYSOF IQ TORIC 18.0 ×2 IMPLANT
LENS IOL IQ TORIC 3 18.0 ×1 IMPLANT
PACK CATARACT (MISCELLANEOUS) ×3 IMPLANT
PACK CATARACT BRASINGTON LX (MISCELLANEOUS) ×3 IMPLANT
PACK EYE AFTER SURG (MISCELLANEOUS) ×3 IMPLANT
SOL BSS BAG (MISCELLANEOUS) ×3
SOLUTION BSS BAG (MISCELLANEOUS) ×1 IMPLANT
SYR 5ML LL (SYRINGE) ×3 IMPLANT
WATER STERILE IRR 250ML POUR (IV SOLUTION) ×3 IMPLANT
WIPE NON LINTING 3.25X3.25 (MISCELLANEOUS) ×3 IMPLANT

## 2018-08-03 NOTE — Anesthesia Procedure Notes (Signed)
Procedure Name: MAC Date/Time: 08/03/2018 10:12 AM Performed by: Johnna Acosta, CRNA Pre-anesthesia Checklist: Patient identified, Emergency Drugs available, Suction available, Patient being monitored and Timeout performed Patient Re-evaluated:Patient Re-evaluated prior to induction Oxygen Delivery Method: Nasal cannula

## 2018-08-03 NOTE — Op Note (Signed)
PREOPERATIVE DIAGNOSIS:  Nuclear sclerotic cataract of the right eye.   POSTOPERATIVE DIAGNOSIS:  Nuclear sclerotic cataract of the right eye.   OPERATIVE PROCEDURE: Procedure(s): CATARACT EXTRACTION PHACO AND INTRAOCULAR LENS PLACEMENT (IOC)   SURGEON:  [name], MD.   ANESTHESIA: 1.      Managed anesthesia care. 2.     0.66m of Shugarcaine was instilled following the paracentesis  Anesthesiologist: TGunnar Bulla MD CRNA: MJohnna Acosta CRNA  COMPLICATIONS:  None.   TECHNIQUE:   Stop and chop    DESCRIPTION OF PROCEDURE:  The patient was examined and consented in the preoperative holding area where the aforementioned topical anesthesia was applied to the right eye.  The patient was brought back to the Operating Room where he was sat upright on the gurney and given a target to fixate upon while the eye was marked at the 3:00 and 9:00 position.  The patient was then reclined on the operating table.  The eye was prepped and draped in the usual sterile ophthalmic fashion and a lid speculum was placed. A paracentesis was created with the side port blade and the anterior chamber was filled with viscoelastic. A near clear corneal incision was performed with the steel keratome. A continuous curvilinear capsulorrhexis was performed with a cystotome followed by the capsulorrhexis forceps. Hydrodissection and hydrodelineation were carried out with BSS on a blunt cannula. The lens was removed in a stop and chop technique and the remaining cortical material was removed with the irrigation-aspiration handpiece. The eye was inflated with viscoelastic and the ZCT  lens  was placed in the eye and rotated to within a few degrees of the predetermined orientation.  The remaining viscoelastic was removed from the eye.  The Sinskey hook was used to rotate the toric lens into its final resting place at 113 degrees.  The eye was inflated to a physiologic pressure and found to be watertight. 0.183mof  Vigamox was placed in the anterior chamber.  The eye was dressed with Vigamox. The patient was given protective glasses to wear throughout the day and a shield with which to sleep tonight. The patient was also given drops with which to begin a drop regimen today and will follow-up with me in one day. Implant Name Type Inv. Item Serial No. Manufacturer Lot No. LRB No. Used  LENS IOL TORIC 18.0 - [id number]  LENS IOL TORIC 18.0 [id number] ALCON  Right 1   Procedure(s) with comments: CATARACT EXTRACTION PHACO AND INTRAOCULAR LENS PLACEMENT (IOC) (Right) - [place]:43.4 CDE 5.23 Fluid pack Lot # [id number]  Electronically signed: WiBirder Robson1/08/2018 10:33 AM

## 2018-08-03 NOTE — Anesthesia Postprocedure Evaluation (Signed)
Anesthesia Post Note  Patient: Ruben [name]  Procedure(s) Performed: CATARACT EXTRACTION PHACO AND INTRAOCULAR LENS PLACEMENT (IOC) (Right Eye)  Patient location during evaluation: PACU Anesthesia Type: MAC Level of consciousness: awake and alert Pain management: pain level controlled Vital Signs Assessment: post-procedure vital signs reviewed and stable Respiratory status: spontaneous breathing, nonlabored ventilation, respiratory function stable and patient connected to nasal cannula oxygen Cardiovascular status: stable and blood pressure returned to baseline Postop Assessment: no apparent nausea or vomiting Anesthetic complications: no     Last Vitals:  Vitals:   08/03/18 0849 08/03/18 1014  BP: 131/78 111/66  Pulse: 65 61  Resp: 16 16  Temp: (!) 35.8 C (!) 36 C  SpO2: 100% 100%    Last Pain:  Vitals:   08/03/18 1014  TempSrc: Temporal  PainSc: 0-No pain                 Chella Chapdelaine S

## 2018-08-03 NOTE — Anesthesia Post-op Follow-up Note (Signed)
Anesthesia QCDR form completed.        

## 2018-08-03 NOTE — H&P (Signed)
All labs reviewed. Abnormal studies sent to patients PCP when indicated.  Previous H&P reviewed, patient examined, there are NO CHANGES.  Ruben Sollers Porfilio11/12/201910:05 AM

## 2018-08-03 NOTE — Discharge Instructions (Signed)
Eye Surgery Discharge Instructions    Expect mild scratchy sensation or mild soreness. DO NOT RUB YOUR EYE!  The day of surgery:  Minimal physical activity, but bed rest is not required  No reading, computer work, or close hand work  No bending, lifting, or straining.  May watch TV  For 24 hours:  No driving, legal decisions, or alcoholic beverages  Safety precautions  Eat anything you prefer: It is better to start with liquids, then soup then solid foods.  _____ Eye patch should be worn until postoperative exam tomorrow.  ____ Solar shield eyeglasses should be worn for comfort in the sunlight/patch while sleeping  Resume all regular medications including aspirin or Coumadin if these were discontinued prior to surgery. You may shower, bathe, shave, or wash your hair. Tylenol may be taken for mild discomfort.  Call your doctor if you experience significant pain, nausea, or vomiting, fever > 101 or other signs of infection. [phone] or [phone] Specific instructions:  Follow-up Information    [name], MD Follow up on 08/04/2018.   Specialty:  Ophthalmology Why:  appointment time @ 8:05 AM Contact information: [place] [address] [phone]

## 2018-08-03 NOTE — Transfer of Care (Signed)
Immediate Anesthesia Transfer of Care Note  Patient: Ruben [name]  Procedure(s) Performed: CATARACT EXTRACTION PHACO AND INTRAOCULAR LENS PLACEMENT (IOC) (Right Eye)  Patient Location: PACU  Anesthesia Type:MAC  Level of Consciousness: awake, alert  and oriented  Airway & Oxygen Therapy: Patient Spontanous Breathing and Patient connected to nasal cannula oxygen  Post-op Assessment: Report given to RN and Post -op Vital signs reviewed and stable  Post vital signs: Reviewed and stable  Last Vitals:  Vitals Value Taken Time  BP    Temp    Pulse    Resp    SpO2      Last Pain:  Vitals:   08/03/18 1014  TempSrc: Temporal  PainSc: 0-No pain         Complications: No apparent anesthesia complications

## 2018-08-03 NOTE — Anesthesia Preprocedure Evaluation (Signed)
Anesthesia Evaluation  Patient identified by MRN, date of birth, ID band Patient awake    Reviewed: Allergy & Precautions, NPO status , Patient's Chart, lab work & pertinent test results, reviewed documented beta blocker date and time   Airway Mallampati: II  TM Distance: >3 FB     Dental  (+) Chipped   Pulmonary former smoker,           Cardiovascular + dysrhythmias      Neuro/Psych    GI/Hepatic   Endo/Other    Renal/GU Renal disease     Musculoskeletal   Abdominal   Peds  Hematology  (+) Blood dyscrasia, ,   Anesthesia Other Findings Gout. Rbbb.  Reproductive/Obstetrics                             Anesthesia Physical Anesthesia Plan  ASA: III  Anesthesia Plan:    Post-op Pain Management:    Induction:   PONV Risk Score and Plan:   Airway Management Planned:   Additional Equipment:   Intra-op Plan:   Post-operative Plan:   Informed Consent: I have reviewed the patients History and Physical, chart, labs and discussed the procedure including the risks, benefits and alternatives for the proposed anesthesia with the patient or authorized representative who has indicated his/her understanding and acceptance.     Plan Discussed with: CRNA  Anesthesia Plan Comments:         Anesthesia Quick Evaluation

## 2018-08-04 ENCOUNTER — Encounter: Payer: Self-pay | Admitting: Ophthalmology

## 2018-08-09 ENCOUNTER — Other Ambulatory Visit: Admit: 2018-08-09 | Discharge: 2018-08-09 | Payer: MEDICARE

## 2018-08-09 ENCOUNTER — Ambulatory Visit: Admit: 2018-08-09 | Discharge: 2018-08-09 | Payer: MEDICARE

## 2018-08-09 DIAGNOSIS — D45 Polycythemia vera: Secondary | ICD-10-CM | POA: Diagnosis not present

## 2018-08-10 ENCOUNTER — Other Ambulatory Visit: Payer: Self-pay | Admitting: Urgent Care

## 2018-08-16 MED FILL — JAKAFI 25 MG TABLET: 25 | 30 days supply | Qty: 60 | Fill #3

## 2018-08-26 ENCOUNTER — Other Ambulatory Visit: Payer: Self-pay

## 2018-08-26 DIAGNOSIS — N138 Other obstructive and reflux uropathy: Secondary | ICD-10-CM

## 2018-08-26 DIAGNOSIS — N401 Enlarged prostate with lower urinary tract symptoms: Principal | ICD-10-CM

## 2018-08-27 ENCOUNTER — Ambulatory Visit (INDEPENDENT_AMBULATORY_CARE_PROVIDER_SITE_OTHER): Payer: Medicare Other | Admitting: Urology

## 2018-08-27 ENCOUNTER — Encounter: Payer: Self-pay | Admitting: Urology

## 2018-08-27 ENCOUNTER — Other Ambulatory Visit: Payer: Self-pay

## 2018-08-27 ENCOUNTER — Other Ambulatory Visit
Admission: RE | Admit: 2018-08-27 | Discharge: 2018-08-27 | Disposition: A | Discharge status: Home / Self Care | Payer: Medicare Other | Attending: Urology | Admitting: Urology

## 2018-08-27 VITALS — BP 130/69 | HR 69 | Wt 180.0 lb

## 2018-08-27 DIAGNOSIS — N138 Other obstructive and reflux uropathy: Secondary | ICD-10-CM | POA: Diagnosis not present

## 2018-08-27 DIAGNOSIS — N401 Enlarged prostate with lower urinary tract symptoms: Secondary | ICD-10-CM | POA: Diagnosis not present

## 2018-08-27 DIAGNOSIS — R339 Retention of urine, unspecified: Secondary | ICD-10-CM

## 2018-08-27 DIAGNOSIS — Z01812 Encounter for preprocedural laboratory examination: Secondary | ICD-10-CM

## 2018-08-27 LAB — URINALYSIS, COMPLETE (UACMP) WITH MICROSCOPIC
Bacteria, UA: NONE SEEN
Bilirubin Urine: NEGATIVE
GLUCOSE, UA: NEGATIVE mg/dL
Ketones, ur: NEGATIVE mg/dL
Leukocytes, UA: NEGATIVE
Nitrite: NEGATIVE
PROTEIN: 30 mg/dL — AB
Specific Gravity, Urine: 1.015 (ref 1.005–1.030)
Squamous Epithelial / HPF: NONE SEEN (ref 0–5)
pH: 5.5 (ref 5.0–8.0)

## 2018-08-27 NOTE — Progress Notes (Signed)
   08/27/18  CC:  Chief Complaint  Patient presents with  . 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  He is been doing well with self cath, 4-5x daily.  He has no issues. He does not void spontaneously.  He was offered outlet procedures as well as urodynamics in the past but declined.  Previously on Flomax and finasteride but has declined further Flomax given that he is not voiding.  He continues to take finasteride.  His recent PSA 0.57 on 06/2018.   He does have chronic CKD, most recent creatinine stable around 1.5.   He reports that he ultimately elected not to pursue surgical intervention during a time of uncertainty when he was first diagnosed with a myeloproliferative neoplasm.  He seems to be doing well and is stabilized on jakafi.   At this point, he is open to consideration of further intervention for his chronic urinary retention.  Blood pressure 130/69, pulse 69, weight 180 lb (81.6 kg). NED. A&Ox3.   No respiratory distress   Abd soft, NT, ND Normal phallus with bilateral descended testicles  Cystoscopy Procedure Note  Patient identification was confirmed, informed consent was obtained, and patient was prepped using Betadine solution.  Lidocaine jelly was administered per urethral meatus.     Pre-Procedure: - Inspection reveals a normal caliber ureteral meatus.  Procedure: The flexible cystoscope was introduced without difficulty - No urethral strictures/lesions are present. - Enlarged prostate bilobar coaptation - Elevated bladder neck - Bilateral ureteral orifices identified - Bladder mucosa  reveals no ulcers, tumors, or lesions - No bladder stones - Moderate/ severe trabeculation  Retroflexion shows small median lobe protruding into bladder  Post-Procedure: - Patient tolerated the procedure well  Assessment/  Plan:  1. Urinary retention We discussed that he does have an obstructing appearance prostate and may potentially benefit from an outlet procedure if he would consider surgery At this point given that he has not been voiding spontaneously in quite some time and clearly has some sensory impairment, I would mow strongly recommend urodynamics to ensure that he has adequate bladder function and pursuing further evaluation for consideration of outlet procedure We discussed the procedure itself at length today including the risk of the procedure He would like to proceed urodynamics at [facility] u[facility] in [place] and will return to follow-up with the results Continue CIC as per previous - Ambulatory referral to Urology  2. Benign prostatic hyperplasia with urinary obstruction Remains on finasteride If he is considering outlet procedure, it may be beneficial to keep him on this medication, otherwise if he does not like to pursue further intervention, may consider stopping this medication down the road given limited benefit in a patient on chronic CIC   Return for referral for UDS, f/u therafter.  Hollice Espy, MD

## 2018-08-28 LAB — URINE CULTURE: Culture: NO GROWTH

## 2018-08-30 DIAGNOSIS — E872 Acidosis: Secondary | ICD-10-CM | POA: Diagnosis not present

## 2018-08-30 DIAGNOSIS — N183 Chronic kidney disease, stage 3 (moderate): Secondary | ICD-10-CM | POA: Diagnosis not present

## 2018-08-30 DIAGNOSIS — N2581 Secondary hyperparathyroidism of renal origin: Secondary | ICD-10-CM | POA: Diagnosis not present

## 2018-08-30 DIAGNOSIS — N1330 Unspecified hydronephrosis: Secondary | ICD-10-CM | POA: Diagnosis not present

## 2018-09-13 ENCOUNTER — Other Ambulatory Visit: Payer: Self-pay | Admitting: Family Medicine

## 2018-09-13 MED ORDER — FINASTERIDE 5 MG PO TABS: 5.0000 mg | ORAL_TABLET | Freq: Every evening | ORAL | 3 refills | Status: DC

## 2018-09-16 MED FILL — JAKAFI 25 MG TABLET: 25 | 30 days supply | Qty: 60 | Fill #4

## 2018-09-28 DIAGNOSIS — Z85828 Personal history of other malignant neoplasm of skin: Secondary | ICD-10-CM | POA: Diagnosis not present

## 2018-09-28 DIAGNOSIS — D0439 Carcinoma in situ of skin of other parts of face: Secondary | ICD-10-CM | POA: Diagnosis not present

## 2018-09-28 DIAGNOSIS — X32XXXA Exposure to sunlight, initial encounter: Secondary | ICD-10-CM | POA: Diagnosis not present

## 2018-09-28 DIAGNOSIS — C44329 Squamous cell carcinoma of skin of other parts of face: Secondary | ICD-10-CM | POA: Diagnosis not present

## 2018-09-28 DIAGNOSIS — D485 Neoplasm of uncertain behavior of skin: Secondary | ICD-10-CM | POA: Diagnosis not present

## 2018-09-28 DIAGNOSIS — Z08 Encounter for follow-up examination after completed treatment for malignant neoplasm: Secondary | ICD-10-CM | POA: Diagnosis not present

## 2018-09-28 DIAGNOSIS — L570 Actinic keratosis: Secondary | ICD-10-CM | POA: Diagnosis not present

## 2018-10-04 DIAGNOSIS — R338 Other retention of urine: Secondary | ICD-10-CM | POA: Diagnosis not present

## 2018-10-06 ENCOUNTER — Encounter: Payer: Self-pay | Admitting: Hematology and Oncology

## 2018-10-06 ENCOUNTER — Inpatient Hospital Stay: Payer: Medicare Other | Attending: Hematology and Oncology | Admitting: Hematology and Oncology

## 2018-10-06 ENCOUNTER — Inpatient Hospital Stay: Payer: Medicare Other

## 2018-10-06 ENCOUNTER — Other Ambulatory Visit: Payer: Self-pay | Admitting: Hematology and Oncology

## 2018-10-06 VITALS — BP 132/73 | HR 63 | Temp 97.5°F | Resp 98 | Ht 72.0 in | Wt 189.3 lb

## 2018-10-06 DIAGNOSIS — E611 Iron deficiency: Secondary | ICD-10-CM | POA: Diagnosis not present

## 2018-10-06 DIAGNOSIS — Z7189 Other specified counseling: Secondary | ICD-10-CM

## 2018-10-06 DIAGNOSIS — R040 Epistaxis: Secondary | ICD-10-CM

## 2018-10-06 DIAGNOSIS — D471 Chronic myeloproliferative disease: Secondary | ICD-10-CM

## 2018-10-06 DIAGNOSIS — D7581 Myelofibrosis: Secondary | ICD-10-CM

## 2018-10-06 DIAGNOSIS — D45 Polycythemia vera: Secondary | ICD-10-CM | POA: Diagnosis not present

## 2018-10-06 DIAGNOSIS — D509 Iron deficiency anemia, unspecified: Secondary | ICD-10-CM

## 2018-10-06 LAB — CBC WITH DIFFERENTIAL/PLATELET
Abs Immature Granulocytes: 3.92 10*3/uL — ABNORMAL HIGH (ref 0.00–0.07)
Basophils Absolute: 0.4 10*3/uL — ABNORMAL HIGH (ref 0.0–0.1)
Basophils Relative: 2 %
Eosinophils Absolute: 0.1 10*3/uL (ref 0.0–0.5)
Eosinophils Relative: 0 %
HCT: 39.7 % (ref 39.0–52.0)
Hemoglobin: 12.4 g/dL — ABNORMAL LOW (ref 13.0–17.0)
Immature Granulocytes: 17 %
Lymphocytes Relative: 6 %
Lymphs Abs: 1.4 10*3/uL (ref 0.7–4.0)
MCH: 27.2 pg (ref 26.0–34.0)
MCHC: 31.2 g/dL (ref 30.0–36.0)
MCV: 87.1 fL (ref 80.0–100.0)
Monocytes Absolute: 3.5 10*3/uL — ABNORMAL HIGH (ref 0.1–1.0)
Monocytes Relative: 15 %
Neutro Abs: 14.4 10*3/uL — ABNORMAL HIGH (ref 1.7–7.7)
Neutrophils Relative %: 60 %
Platelets: 216 10*3/uL (ref 150–400)
RBC: 4.56 MIL/uL (ref 4.22–5.81)
RDW: 21.3 % — ABNORMAL HIGH (ref 11.5–15.5)
WBC: 23.6 10*3/uL — ABNORMAL HIGH (ref 4.0–10.5)
nRBC: 0.8 % — ABNORMAL HIGH (ref 0.0–0.2)

## 2018-10-06 LAB — PROTIME-INR
INR: 1.1
Prothrombin Time: 14.1 seconds (ref 11.4–15.2)

## 2018-10-06 LAB — COMPREHENSIVE METABOLIC PANEL
ALT: 16 U/L (ref 0–44)
AST: 41 U/L (ref 15–41)
Albumin: 4.4 g/dL (ref 3.5–5.0)
Alkaline Phosphatase: 67 U/L (ref 38–126)
Anion gap: 9 (ref 5–15)
BUN: 25 mg/dL — ABNORMAL HIGH (ref 8–23)
CO2: 25 mmol/L (ref 22–32)
Calcium: 9.4 mg/dL (ref 8.9–10.3)
Chloride: 104 mmol/L (ref 98–111)
Creatinine, Ser: 1.53 mg/dL — ABNORMAL HIGH (ref 0.61–1.24)
GFR calc Af Amer: 52 mL/min — ABNORMAL LOW (ref >60)
GFR calc non Af Amer: 45 mL/min — ABNORMAL LOW (ref >60)
Glucose, Bld: 96 mg/dL (ref 70–99)
Potassium: 4.3 mmol/L (ref 3.5–5.1)
Sodium: 138 mmol/L (ref 135–145)
Total Bilirubin: 0.7 mg/dL (ref 0.3–1.2)
Total Protein: 8.4 g/dL — ABNORMAL HIGH (ref 6.5–8.1)

## 2018-10-06 LAB — URIC ACID: Uric Acid, Serum: 4.7 mg/dL (ref 3.7–8.6)

## 2018-10-06 LAB — APTT: aPTT: 41 seconds — ABNORMAL HIGH (ref 24–36)

## 2018-10-06 LAB — LACTATE DEHYDROGENASE: LDH: 820 U/L — ABNORMAL HIGH (ref 98–192)

## 2018-10-06 LAB — FERRITIN: Ferritin: 37 ng/mL (ref 24–336)

## 2018-10-06 NOTE — Progress Notes (Signed)
[facility] day:  10/06/2018   Chief Complaint: ENRIGUE HASHIMI is a 73 y.o. 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  HPI: The patient was last seen in the medical oncology clinic on 07/07/2018.  At that time, he was doing well.  He had significant epistaxis requiring packing x 2 and cautery on the left.  Exam revealed no adenopathy or hepatosplenomegaly. Hematocrit was 37.6 and hemoglobin 11.7.  Platelets 168,000.  WBC 17,8000 with an ANC of 12,500.  LDH was 671 (stable).  He continued Jakafi.  He was seen by Dr Adriana Simas on 07/12/2018.  Notes reviewed.  He was doing very well overall and continued on ruxolitinib 53m po BID. Given the MDS/MPN overlap features as well as the mutations in IDH2, RUNX1, and SRSF2, coupled with 5% blasts noted from the bone marrow on 04/07/2016 and the down-trending (but normal) platelet count, bone marrow biopsy was recommended to re-assess disease status (? accelerated disease).   CBC on 08/09/2018 revealed a hematocrit of 37.7, hemoglobin 12.2, MCV 83.6, platelets 211,000, and WBC 24,000 with 71% neutrophils, 12% lymphocytes, and 7% monocytes.  Bone marrow on 08/09/2018 revealed a hypercellular bone marrow (>95%) with persistent involvement by myeloid neoplasm with <1% blasts by manual touch preparation differential.  There was marked reticulin fibrosis with focal collagen deposition.  Cytogenetics were normal (46, XY).  -  Myeloid mutation panel is pending.  During the interim, he has felt "ok".  He notes cataract surgery in October/November.  He notes squamous cell carcinoma x 2 in 10/2018.  He is being followed by Dr. DEvorn Gong  He is no longer volunteering at [employer].  He denies any B symptoms.  He has had no bleeding off aspirin.   Past Medical History:  Diagnosis Date  . Blood dyscrasia   . BPH (benign prostatic hyperplasia)   . Cancer ([place]    SKIN/ POLYCYTHEMIA  VERA  . Chronic kidney disease    RENAL INSUFF  . Crohn disease ([place]   . Crohn's disease ([place]   . Glaucoma   . Glaucoma   . Gout   . History of chicken pox   . Nosebleed   . RBBB   . Right bundle branch block     Past Surgical History:  Procedure Laterality Date  . CATARACT EXTRACTION W/PHACO Left 07/13/2018   Procedure: CATARACT EXTRACTION PHACO AND INTRAOCULAR LENS PLACEMENT (IOC);  Surgeon: PBirder Robson MD;  Location: [facility] ORS;  Service: Ophthalmology;  Laterality: Left;  [place]:39  CDE 5.14 Fluid pack lot # [id number]  . CATARACT EXTRACTION W/PHACO Right 08/03/2018   Procedure: CATARACT EXTRACTION PHACO AND INTRAOCULAR LENS PLACEMENT (IOC);  Surgeon: PBirder Robson MD;  Location: [facility] ORS;  Service: Ophthalmology;  Laterality: Right;  [place]00:43.4 CDE 5.23 Fluid pack Lot # [id number]  . COLONOSCOPY WITH PROPOFOL N/A 03/27/2017   Procedure: COLONOSCOPY WITH PROPOFOL;  Surgeon: EManya Silvas MD;  [facility];  Service: Endoscopy;  Laterality: N/A;  . MOHS SURGERY     EAR  . PROSTATE SURGERY  2008   Prostate Biopsy in [place] to elevated PSA.  per patient normal  . Skin Lesion Basal cell removed    . wart removal     from eyelid    Family History  Problem Relation Age of Onset  . Cancer Mother        Liver  . Cancer Father  Prostate  . Cancer Sister        Hodgkins lymphoma  . Cancer Paternal Aunt        Breast    Social History:  reports that he quit smoking about 15 years ago. His smoking use included cigarettes. He has a 5.00 pack-year smoking history. He has never used smokeless tobacco. He reports current alcohol use of about 4.0 - 5.0 standard drinks of alcohol per week. He reports that he does not use drugs.  He previously worked for [employer].  He no longer volunteers for [employer].  The patient is accompanied by his wife, [name], today.  Allergies: No Known Allergies  Current Medications: Current Outpatient  Medications  Medication Sig Dispense Refill  . Cholecalciferol (VITAMIN D3) 1000 units CAPS Take 1,000 Units by mouth daily at 12 noon.     . Cyanocobalamin (RA VITAMIN B-12 TR) 1000 MCG TBCR Take 1,000 mcg by mouth daily at 12 noon.     . ferrous sulfate 325 (65 FE) MG tablet Take 325 mg by mouth every Monday, Wednesday, and Friday.     . finasteride (PROSCAR) 5 MG tablet Take 1 tablet (5 mg total) by mouth every evening. 90 tablet 3  . folic acid (FOLVITE) 1 MG tablet Take 1 mg by mouth daily at 12 noon.     . latanoprost (XALATAN) 0.005 % ophthalmic solution Place 1 drop into both eyes at bedtime.     . Multiple Vitamins-Minerals (CENTRUM SILVER ULTRA MENS) TABS Take 1 tablet by mouth daily at 12 noon.     . Omega-3 Fatty Acids (KP FISH OIL) 1200 MG CAPS Take 1,200 mg by mouth daily.     . sodium bicarbonate 650 MG tablet Take 650 mg by mouth 2 (two) times daily.    . timolol (TIMOPTIC) 0.5 % ophthalmic solution Place 1 drop into both eyes daily.  5  . acetaminophen (TYLENOL) 500 MG tablet Take 500 mg by mouth daily as needed for moderate pain.    Marland Kitchen allopurinol (ZYLOPRIM) 300 MG tablet TAKE 1 TABLET BY MOUTH EVERY DAY 90 tablet 0  . JAKAFI 25 MG tablet TAKE 1 TABLET (25 MG TOTAL) BY MOUTH 2 TIMES DAILY. 60 tablet 5   No current facility-administered medications for this visit.     Review of Systems  Constitutional: Negative.  Negative for chills, diaphoresis, fever, malaise/fatigue and weight loss (up 4 pounds).  HENT: Negative for congestion, ear discharge, nosebleeds, sinus pain, sore throat and tinnitus.   Eyes: Negative for double vision, photophobia, pain and discharge.       S/p cataract surgery.  Distant vision is good.  Uses reading glasses.  Respiratory: Negative.  Negative for cough, hemoptysis, sputum production and shortness of breath.   Cardiovascular: Negative.  Negative for chest pain, palpitations, orthopnea, leg swelling and PND.  Gastrointestinal: Negative.  Negative  for abdominal pain, blood in stool, constipation, diarrhea (at times), heartburn, melena, nausea and vomiting.  Genitourinary: Negative for dysuria, frequency, hematuria and urgency.       Urinary catheterization (self) 4x/day  Musculoskeletal: Negative.  Negative for back pain, falls, joint pain, myalgias and neck pain.  Skin: Negative for itching and rash.       Interval squamous cell cancer x 2.  Neurological: Negative for dizziness, tremors, sensory change, speech change, focal weakness, weakness and headaches.  Endo/Heme/Allergies: Negative.  Does not bruise/bleed easily.  Psychiatric/Behavioral: Negative.  Negative for depression and memory loss. The patient is not nervous/anxious and  does not have insomnia.   All other systems reviewed and are negative.  Performance status (ECOG): 1  Vital Signs BP 132/73 (BP Location: Right Arm, Patient Position: Sitting)   Pulse 63   Temp (!) 97.5 F (36.4 C) (Tympanic)   Resp (!) 98   Ht 6' (1.829 m)   Wt 189 lb 4.2 oz (85.9 kg)   SpO2 100%   BMI 25.67 kg/m   Physical Exam  Constitutional: He is oriented to person, place, and time and well-developed, well-nourished, and in no distress. No distress.  HENT:  Head: Normocephalic and atraumatic.  Mouth/Throat: Oropharynx is clear and moist. No oropharyngeal exudate.  Short gray hair.  Eyes: Pupils are equal, round, and reactive to light. Conjunctivae and EOM are normal. No scleral icterus.  Glasses.  Blue eyes.  Neck: Normal range of motion. Neck supple. No JVD present.  Cardiovascular: Normal rate, regular rhythm and normal heart sounds. Exam reveals no gallop and no friction rub.  No murmur heard. Pulmonary/Chest: Effort normal and breath sounds normal. No respiratory distress. He has no wheezes. He has no rales.  Abdominal: Soft. Bowel sounds are normal. He exhibits no distension and no mass. There is no abdominal tenderness. There is no rebound and no guarding.  Musculoskeletal: Normal  range of motion.        General: No tenderness or edema.  Lymphadenopathy:    He has no cervical adenopathy.    He has no axillary adenopathy.       Right: No supraclavicular adenopathy present.       Left: No supraclavicular adenopathy present.  Neurological: He is alert and oriented to person, place, and time.  Skin: Skin is warm and dry. No rash noted. He is not diaphoretic. No erythema. No pallor.  Right cheek eschar.  Psychiatric: Mood, affect and judgment normal.  Nursing note and vitals reviewed.   Clinical Support on 10/06/2018  Component Date Value Ref Range Status  . Coagulation Factor VIII 10/06/2018 145* 56 - 140 % Final  . Ristocetin Co-factor, Plasma 10/06/2018 174  50 - 200 % Final   Comment: (NOTE) Performed At: [facility] [place], [place] [id number] Rush Farmer MD ZH:[id number]   . Von Willebrand Antigen, Plasma 10/06/2018 177  50 - 200 % Final   Comment: (NOTE) This test was developed and its performance characteristics determined by [facility]. It has not been cleared or approved by the Food and Drug Administration.   Marland Kitchen aPTT 10/06/2018 41* 24 - 36 seconds Final   Comment:        IF BASELINE aPTT IS ELEVATED, SUGGEST PATIENT RISK ASSESSMENT BE USED TO DETERMINE APPROPRIATE ANTICOAGULANT THERAPY. Performed at [facility], [address], [place], [place] [address]   . Prothrombin Time 10/06/2018 14.1  11.4 - 15.2 seconds Final  . INR 10/06/2018 1.10   Final   Performed at [facility], [address] [place], [place] [address]  . Uric Acid, Serum 10/06/2018 4.7  3.7 - 8.6 mg/dL Final   Performed at [facility], [address] [place] [address]  . Ferritin 10/06/2018 37  24 - 336 ng/mL Final   Performed at [facility], [address] [place], [place] [address]  . LDH 10/06/2018 820* 98 - 192 U/L Final   Comment: RESULTS CONFIRMED BY MANUAL DILUTION Performed at [facility], [address], [place],  [address]   . Sodium 10/06/2018 138  135 -  145 mmol/L Final  . Potassium 10/06/2018 4.3  3.5 - 5.1 mmol/L Final  . Chloride 10/06/2018 104  98 - 111 mmol/L Final  . CO2 10/06/2018 25  22 - 32 mmol/L Final  . Glucose, Bld 10/06/2018 96  70 - 99 mg/dL Final  . BUN 10/06/2018 25* 8 - 23 mg/dL Final  . Creatinine, Ser 10/06/2018 1.53* 0.61 - 1.24 mg/dL Final  . Calcium 10/06/2018 9.4  8.9 - 10.3 mg/dL Final  . Total Protein 10/06/2018 8.4* 6.5 - 8.1 g/dL Final  . Albumin 10/06/2018 4.4  3.5 - 5.0 g/dL Final  . AST 10/06/2018 41  15 - 41 U/L Final  . ALT 10/06/2018 16  0 - 44 U/L Final  . Alkaline Phosphatase 10/06/2018 67  38 - 126 U/L Final  . Total Bilirubin 10/06/2018 0.7  0.3 - 1.2 mg/dL Final  . GFR calc non Af Amer 10/06/2018 45* >60 mL/min Final  . GFR calc Af Amer 10/06/2018 52* >60 mL/min Final  . Anion gap 10/06/2018 9  5 - 15 Final   Performed at [facility], [address] [place], [place] [address]  . WBC 10/06/2018 23.6* 4.0 - 10.5 K/uL Final  . RBC 10/06/2018 4.56  4.22 - 5.81 MIL/uL Final  . Hemoglobin 10/06/2018 12.4* 13.0 - 17.0 g/dL Final  . HCT 10/06/2018 39.7  39.0 - 52.0 % Final  . MCV 10/06/2018 87.1  80.0 - 100.0 fL Final  . MCH 10/06/2018 27.2  26.0 - 34.0 pg Final  . MCHC 10/06/2018 31.2  30.0 - 36.0 g/dL Final  . RDW 10/06/2018 21.3* 11.5 - 15.5 % Final  . Platelets 10/06/2018 216  150 - 400 K/uL Final  . nRBC 10/06/2018 0.8* 0.0 - 0.2 % Final  . Neutrophils Relative % 10/06/2018 60  % Final  . Neutro Abs 10/06/2018 14.4* 1.7 - 7.7 K/uL Final  . Lymphocytes Relative 10/06/2018 6  % Final  . Lymphs Abs 10/06/2018 1.4  0.7 - 4.0 K/uL Final  . Monocytes Relative 10/06/2018 15  % Final  . Monocytes Absolute 10/06/2018 3.5* 0.1 - 1.0 K/uL Final  . Eosinophils Relative 10/06/2018 0  % Final  . Eosinophils Absolute 10/06/2018 0.1  0.0 - 0.5 K/uL Final  . Basophils Relative 10/06/2018 2  % Final   . Basophils Absolute 10/06/2018 0.4* 0.0 - 0.1 K/uL Final  . Immature Granulocytes 10/06/2018 17  % Final  . Abs Immature Granulocytes 10/06/2018 3.92* 0.00 - 0.07 K/uL Final   Performed at [facility], [address] [place], [place] [address]  . Interpretation 10/06/2018 Note   Final   Comment: (NOTE) ------------------------------- COAGULATION: VON WILLEBRAND FACTOR ASSESSMENT CURRENT RESULTS ASSESSMENT The VWF:Ag is normal. The VWF:RCo is normal. The FVIII is elevated. VON WILLEBRAND FACTOR ASSESSMENT CURRENT RESULTS INTERPRETATION - These results are not consistent with a diagnosis of VWD according to the current NHLBI guideline. Persistently elevated FVIII activity is a risk factor for venous thrombosis as well as recurrence of venous thrombosis. Risk is graded and increases with the degree of elevation. Although elevated FVIII activity has been identified to cluster within families, a genetic basis for the elevation has not yet been elucidated (Br J Haematol. 2012; 157(6):653-663). VON WILLEBRAND FACTOR ASSESSMENT - Results may be falsely elevated and possibly falsely normal as VWF and FVIII may increase in samples drawn from patients (particularly children) who are visibly stressed at the time of phlebotomy, as acute phase reactants, or in respons  e to certain drug therapies such as desmopressin. Repeat testing may be necessary before excluding a diagnosis of VWD especially if the clinical suspicion is high for an underlying bleeding disorder. The setting for phlebotomy should be as calm as possible and patients should be encouraged to sit quietly prior to the blood draw. VON WILLEBRAND FACTOR ASSESSMENT DEFINITIONS - VWD - von Willebrand disease; VWF - von Willebrand factor; VWF:Ag - VWF antigen; VWF:RCo - VWF ristocetin cofactor activity; FVIII - factor VIII activity. - MEDICAL DIRECTOR: For questions regarding panel  interpretation, please contact Jake Bathe, M.D. at [facility] at [phone]. ------------------------------- DISCLAIMER These assessments and interpretations are provided as a convenience in support of the physician-patient relationship and are not intended to replace the physician's clinical judgment. They are derived from national guidelines in addition to other ev                          idence and expert opinion. The clinician should consider this information within the context of clinical opinion and the individual patient. SEE GUIDANCE FOR VON WILLEBRAND FACTOR ASSESSMENT: (1) The National Heart, Lung and Blood Institute. The Diagnosis, Evaluation and Management of von Willebrand Disease. Janeal Holmes, MD: [place] Publication 33-0076. 2263. Available at vSpecials.com.pt. (2) Daryl Eastern et al. [place] J Hematol. 2009; 84(6):366-370. (3) [place]. 2004;10(3):199-217. (4) Pasi KJ et al. Haemophilia. 2004; 10(3):218-231. Performed At: [facility] [place], [place] [id number] Thomasene Ripple MD [id number]     Assessment:  Ruben [name] is a 73 y.o. 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  He has had polycythemia dating back to 2013.  Hematocrit was 62.1 with a hemoglobin of 19.8 on 03/28/2015.  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  Erythropoietin level was 1.1 (low).  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  He began a phlebotomy program on 03/28/2015 to maintain a hematocrit goal of < 45.  He last underwent phlebotomy on 05/02/2015.  He is on a baby aspirin.  He is followed by GI (Dr. Vira Agar) for a history of polyps and Crohn's disease.  Last colonoscopy was 3-4 years ago.  He has become iron deficient on his phlebotomy program (ferritin 21 on 11/28/2015).   CBC on 11/28/2015 revealed a hematocrit of 39.3, hemoglobin 11.9, platelets 463,000, WBC  32,000 with an ANC of 25,000.  Differential included 71% segs, 4% lymphocytes and 17% monocytes.  Peripheral smear revealed leukocytosis with predominantly mature neutrophils, increased monocytes and rare blasts (<1%).  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  Marrow was packed (95-100% cellularity) with pan myelosis, multi-lineage dyspoiesis, and no significant increase in blasts.  There was moderate to focally marked reticulin fibrosis (grade 2-3/3).  Storage iron was not identified.  Flow cytometry revealed non-specific atypical myeloid findings with no increase in blasts.  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  Cytogenetics were normal (46, XY).  Bone marrow on 04/07/2016 at [facility] revealed a hypercellular marrow (> 95%) with persistent involvement by myeloid neoplasm with 5% blasts.  There was mild reticulin fibrosis.  FISH t(9;22) results were normal.  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  Cytogenetics are pending.  Bone marrow on 08/09/2018 revealed a hypercellular bone marrow (>95%) with persistent involvement by myeloid neoplasm with <  1% blasts by manual touch preparation differential.  There was marked reticulin fibrosis with focal collagen deposition.  Cytogenetics were normal (46, XY).  -  Myeloid mutation panel study is pending.  He was briefly on hydroxyurea 500 mg a day (12/05/2015 - 12/19/2015 and 06/25/2016 - 07/03/2016).   Platelet count increased to 1.18 million and white count to 54,800 without increased blasts on 06/25/2016.  CBC on 07/02/2016 revealed a platelets of 429,000 with a white count 30,600.  He began allopurinol on 12/19/2015.  He began Jakafi 20 mg BID on 05/28/2016.  Jakafi was increased to 25 mg BID on 05/26/2017.  LDH is  followed:  466 on 01/02/2016, 596 on 05/23/2016, 663 on 07/30/2016, 522 on 09/24/2016, 638 on 01/05/2017, 625 on 02/25/2017, 1144 on 06/10/2017, 720 on 06/17/2017, 728 on 06/24/2017, and 724 on 01/06/2018.  Triglycerides were 183 (< 150) on 08/20/2016, 180 on 01/05/2017, 312 on 06/17/2017, 165 on 11/25/2017, 113 on 02/17/2018.  He has urinary retention.  Renal ultrasound on 07/28/2016 revealed bilateral hydronephrosis (right > left) and a large postvoid residual (2 liters) suggesting bladder dysfunction or outlet obstruction.  He underwent temporary Foley catheter placement.  He performs I/O self catheterizations.  He is on Flomax and finasteride.  PSA was 2.02 on 07/09/2016.  He has chronic renal insufficiency (Cr 1.85; CrCl 36 ml/min).  SPEP on 09/23/2016 was negative.  Spot urine revealed 17.4% of 27.3 mg/dL.of a monoclonal protein.  24 hour urine on 10/16/2016 revealed no monoclonal protein.  Renal function transiently decreased in 06/2017 after Bactrim.  He is followed by nephrology and urology.  Symptomatically, he is doing well.  He has had squamous cell cancer x 2 removed.  He has had no further epistaxis off aspirin.  Exam reveals no adenopathy or hepatosplenomegaly. Hematocrit is 39.7 and hemoglobin 12.4.  Platelets 216,000.  WBC 23,600 with an [place] of 14,400.  LDH is 820.  Plan: 1. Labs today:  CBC with diff, CMP, LDH, uric acid, ferritin, PT, PTT, von Willebrand panel. 2.  Polycythemia rubra vera:   Patient clinically stable.   WBC and LDH slightly higher.   Interval bone marrow reviewed- no increased blasts.  Marked reticulin fibrosis.  Cytogenetics normal.   Follow-up myeloid mutation panel.  3.  Iron deficiency:   Patient on oral iron on Mondays, Wednesdays, and Fridays.   Hemoglobin is 12.4.  Ferritin is 37.   Continue close monitoring. 4.  Epistaxis:   No interval epistaxis.   Off aspirin.   Patient denies any other bleeding or excess bruising.   Suspect anatomic issue rather  than bleeding diathesis.   Check PT, PTT, and von Willebrand panel. 5.  RTC in 3 months for MD assessment and labs (CBC with diff, CMP, LDH, uric acid, ferritin, lipid panel).   Lequita Asal, MD  10/06/2018, 4:35 PM

## 2018-10-06 NOTE — Progress Notes (Signed)
No new changes noted today 

## 2018-10-07 LAB — VON WILLEBRAND PANEL
Coagulation Factor VIII: 145 % — ABNORMAL HIGH (ref 56–140)
Ristocetin Co-factor, Plasma: 174 % (ref 50–200)
Von Willebrand Antigen, Plasma: 177 % (ref 50–200)

## 2018-10-07 LAB — COAG STUDIES INTERP REPORT

## 2018-10-11 ENCOUNTER — Other Ambulatory Visit: Payer: Self-pay

## 2018-10-11 DIAGNOSIS — D471 Chronic myeloproliferative disease: Secondary | ICD-10-CM

## 2018-10-11 NOTE — Progress Notes (Signed)
10/12/2018  11:29 AM   [name] May 21, 1946 [id number]  Referring provider: [name], [address] [place], [place] [address]  Chief Complaint  Patient presents with  . Follow-up   HPI: Ruben [name] is a 73 y.o. White or Caucasian male that presents today to discuss his options following his UDS results. He has a history of chronic urinary retention managed with clean intermittent catheterization since 2017 and BPH with urinary obstruction.  Cystoscopy on 08/27/2018 with small median lobe protruding into bladder. Mutual agreement for plan to undergo UDS to assess bladder function.  UDS results reveal first bladder sensation at 259.  His first desire to void was at 252 mL's.  He was able to generate a well sustained bladder contracture with standing, voided 80s ML's with a max flow rate of 3 mL's per second.  Max detrusor pressure was 89 cm of water.  PVR was approximately 963 mL's.  Uroscopy, diverticula and trabeculation was appreciated.  There was no reflux.  Please see full report for details.  PMH: Past Medical History:  Diagnosis Date  . Blood dyscrasia   . BPH (benign prostatic hyperplasia)   . Cancer ([place])    SKIN/ POLYCYTHEMIA VERA  . Chronic kidney disease    RENAL INSUFF  . Crohn disease ([place])   . Crohn's disease ([place])   . Glaucoma   . Glaucoma   . Gout   . History of chicken pox   . Nosebleed   . RBBB   . Right bundle branch block    Surgical History: Past Surgical History:  Procedure Laterality Date  . CATARACT EXTRACTION W/PHACO Left 07/13/2018   Procedure: CATARACT EXTRACTION PHACO AND INTRAOCULAR LENS PLACEMENT (IOC);  Surgeon: [name], MD;  Location: [facility] ORS;  Service: Ophthalmology;  Laterality: Left;  [place] 00:39  CDE 5.14 Fluid pack lot # [id number]  . CATARACT EXTRACTION W/PHACO Right 08/03/2018   Procedure: CATARACT EXTRACTION PHACO AND INTRAOCULAR LENS PLACEMENT (IOC);  Surgeon: [name], MD;  Location:  [facility] ORS;  Service: Ophthalmology;  Laterality: Right;  [place] 00:43.4 CDE 5.23 Fluid pack Lot # [id number]  . COLONOSCOPY WITH PROPOFOL N/A 03/27/2017   Procedure: COLONOSCOPY WITH PROPOFOL;  Surgeon: [name], MD;  Location: [facility] ENDOSCOPY;  Service: Endoscopy;  Laterality: N/A;  . MOHS SURGERY     EAR  . PROSTATE SURGERY  2008   Prostate Biopsy in [place] to elevated PSA.  per patient normal  . Skin Lesion Basal cell removed    . wart removal     from eyelid   Home Medications:  Allergies as of 10/12/2018   No Known Allergies     Medication List       Accurate as of October 12, 2018 11:29 AM. Always use your most recent med list.        acetaminophen 500 MG tablet Commonly known as:  TYLENOL Take 500 mg by mouth daily as needed for moderate pain.   allopurinol 300 MG tablet Commonly known as:  ZYLOPRIM TAKE 1 TABLET BY MOUTH EVERY DAY   CENTRUM SILVER ULTRA MENS Tabs Take 1 tablet by mouth daily at 12 noon.   ferrous sulfate 325 (65 FE) MG tablet Take 325 mg by mouth every Monday, Wednesday, and Friday.   finasteride 5 MG tablet Commonly known as:  PROSCAR Take 1 tablet (5 mg total) by mouth every evening.   folic acid 1 MG tablet Commonly known as:  FOLVITE Take 1 mg  by mouth daily at 12 noon.   JAKAFI 25 MG tablet Generic drug:  ruxolitinib phosphate TAKE 1 TABLET (25 MG TOTAL) BY MOUTH 2 TIMES DAILY.   KP FISH OIL 1200 MG Caps Take 1,200 mg by mouth daily.   latanoprost 0.005 % ophthalmic solution Commonly known as:  XALATAN Place 1 drop into both eyes at bedtime.   RA VITAMIN B-12 TR 1000 MCG Tbcr Generic drug:  Cyanocobalamin Take 1,000 mcg by mouth daily at 12 noon.   sodium bicarbonate 650 MG tablet Take 650 mg by mouth 2 (two) times daily.   timolol 0.5 % ophthalmic solution Commonly known as:  TIMOPTIC Place 1 drop into both eyes daily.   Vitamin D3 25 MCG (1000 UT) Caps Take 1,000 Units by mouth daily at 12 noon.       Allergies: No Known Allergies  Family History: Family History  Problem Relation Age of Onset  . Cancer Mother        Liver  . Cancer Father        Prostate  . Cancer Sister        Hodgkins lymphoma  . Cancer Paternal Aunt        Breast   Social History:  reports that he quit smoking about 15 years ago. His smoking use included cigarettes. He has a 5.00 pack-year smoking history. He has never used smokeless tobacco. He reports current alcohol use of about 4.0 - 5.0 standard drinks of alcohol per week. He reports that he does not use drugs.  ROS: UROLOGY Frequent Urination?: No Hard to postpone urination?: No Burning/pain with urination?: No Get up at night to urinate?: No Leakage of urine?: No Urine stream starts and stops?: No Trouble starting stream?: No Do you have to strain to urinate?: No Blood in urine?: No Urinary tract infection?: No Sexually transmitted disease?: No Injury to kidneys or bladder?: No Painful intercourse?: No Weak stream?: No Erection problems?: No Penile pain?: No  Gastrointestinal Nausea?: No Vomiting?: No Indigestion/heartburn?: No Diarrhea?: No Constipation?: No  Constitutional Fever: No Night sweats?: No Weight loss?: No Fatigue?: No  Skin Skin rash/lesions?: No Itching?: No  Eyes Blurred vision?: No Double vision?: No  Ears/Nose/Throat Sore throat?: No Sinus problems?: No  Hematologic/Lymphatic Swollen glands?: No Easy bruising?: No  Cardiovascular Leg swelling?: No Chest pain?: No  Respiratory Cough?: No Shortness of breath?: No  Endocrine Excessive thirst?: No  Musculoskeletal Back pain?: No Joint pain?: No  Neurological Headaches?: No Dizziness?: No  Psychologic Depression?: No Anxiety?: No  Physical Exam: BP 126/75 (BP Location: Left Arm, Patient Position: Sitting, Cuff Size: Normal)   Pulse 69   Ht 6' (1.829 m)   Wt 188 lb (85.3 kg)   BMI 25.50 kg/m   Physical Exam Vitals signs  reviewed.  Constitutional:      General: He is not in acute distress. HENT:     Head: Normocephalic and atraumatic.  Pulmonary:     Effort: Pulmonary effort is normal. No respiratory distress.  Abdominal:     Tenderness: There is no right CVA tenderness or left CVA tenderness.  Skin:    Findings: No bruising, lesion or rash.  Neurological:     General: No focal deficit present.     Mental Status: He is alert and oriented to person, place, and time.  Psychiatric:        Mood and Affect: Mood and affect normal.   Laboratory Data: Lab Results  Component Value Date   WBC  23.6 (H) 10/06/2018   HGB 12.4 (L) 10/06/2018   HCT 39.7 10/06/2018   MCV 87.1 10/06/2018   PLT 216 10/06/2018   Lab Results  Component Value Date   CREATININE 1.53 (H) 10/06/2018   Lab Results  Component Value Date   PSA 2.02 07/09/2016   PSA 2.1 04/10/2014   Assessment & Plan:   1. BPH with outlet obstruction  - UDS supports that he is able to generate a sustained contraction with an obstructive flow pattern.   - Would likely void at least some spontaneously following an outlet procedure with possible complete emptying based on UDS results  - We discussed alternatives including TURP vs. holmium laser enucleation of the prostate (HoLEP) vs. greenlight laser ablation. Differences between the surgical procedures were discussed as well as the risks and benefits of each.  - Discussed that due to patient's prostate size (>100grams), recommend against standard TURP. I recommend HoLEP vs. Open simple prostatectomy vs. Robotic simple prostatectomy.    - We discussed the common postoperative course following holep including need for overnight Foley catheter, temporary worsening of irritative voiding symptoms, and occasional stress incontinence which typically lasts up to 6 months but can persist. We discussed retrograde ejaculation and damage to surrounding structures including the urinary sphincter. Other uncommon  complications including hematuria and urinary tract infection.  - Risk of bleeding, infection, damage surrounding structures, injury to the bladder/ urethral, bladder neck contracture, ureteral stricture, retrograde ejaculation, stress/ urge incontinence, exacerbation of irritative voiding symptoms were all discussed in detail.    - Patient expressed concern for incontinence and its effect on his quality of life due to his fathers experience following radical prostatectomy for prostate cancer. We discussed the rarity of permanenant urinary incontinence.  - He understands all of the above and would like to consider this options. Provided patient with educational literature on HoLEP.  - Patient will call if he decides to undergo HoLEP for scheduling. Otherwise follow up in one year for surveillance cystoscopy  2. Chronic Urinary Retention  - See above  Hollice Espy, MD [facility] [address], [place], [place] [address] [phone]  I, Temidayo Atanda-Ogunleye , am acting as a scribe for Hollice Espy, MD  I have reviewed the above documentation for accuracy and completeness, and I agree with the above.   Hollice Espy, MD  I spent 25 min with this patient of which greater than 50% was spent in counseling and coordination of care with the patient.

## 2018-10-12 ENCOUNTER — Ambulatory Visit (INDEPENDENT_AMBULATORY_CARE_PROVIDER_SITE_OTHER): Payer: Medicare Other | Admitting: Urology

## 2018-10-12 ENCOUNTER — Encounter: Payer: Self-pay | Admitting: Urology

## 2018-10-12 VITALS — BP 126/75 | HR 69 | Ht 72.0 in | Wt 188.0 lb

## 2018-10-12 DIAGNOSIS — R339 Retention of urine, unspecified: Secondary | ICD-10-CM | POA: Diagnosis not present

## 2018-10-12 DIAGNOSIS — N138 Other obstructive and reflux uropathy: Secondary | ICD-10-CM

## 2018-10-12 DIAGNOSIS — N401 Enlarged prostate with lower urinary tract symptoms: Secondary | ICD-10-CM

## 2018-10-18 ENCOUNTER — Encounter: Payer: Self-pay | Admitting: Hematology and Oncology

## 2018-10-18 MED FILL — JAKAFI 25 MG TABLET: 25 | 30 days supply | Qty: 60 | Fill #5

## 2018-10-28 DIAGNOSIS — C44329 Squamous cell carcinoma of skin of other parts of face: Secondary | ICD-10-CM | POA: Diagnosis not present

## 2018-11-08 ENCOUNTER — Inpatient Hospital Stay: Payer: Medicare Other | Attending: Hematology and Oncology

## 2018-11-08 DIAGNOSIS — D45 Polycythemia vera: Secondary | ICD-10-CM | POA: Insufficient documentation

## 2018-11-08 DIAGNOSIS — E611 Iron deficiency: Secondary | ICD-10-CM | POA: Diagnosis not present

## 2018-11-08 DIAGNOSIS — D0439 Carcinoma in situ of skin of other parts of face: Secondary | ICD-10-CM | POA: Diagnosis not present

## 2018-11-08 DIAGNOSIS — D471 Chronic myeloproliferative disease: Secondary | ICD-10-CM

## 2018-11-08 LAB — CBC WITH DIFFERENTIAL/PLATELET
Abs Immature Granulocytes: 1.3 10*3/uL — ABNORMAL HIGH (ref 0.00–0.07)
Band Neutrophils: 10 %
Basophils Absolute: 0.5 10*3/uL — ABNORMAL HIGH (ref 0.0–0.1)
Basophils Relative: 2 %
Eosinophils Absolute: 0.3 10*3/uL (ref 0.0–0.5)
Eosinophils Relative: 1 %
HCT: 35.9 % — ABNORMAL LOW (ref 39.0–52.0)
Hemoglobin: 11.4 g/dL — ABNORMAL LOW (ref 13.0–17.0)
Lymphocytes Relative: 13 %
Lymphs Abs: 3.3 10*3/uL (ref 0.7–4.0)
MCH: 27.3 pg (ref 26.0–34.0)
MCHC: 31.8 g/dL (ref 30.0–36.0)
MCV: 85.9 fL (ref 80.0–100.0)
Metamyelocytes Relative: 1 %
Monocytes Absolute: 1 10*3/uL (ref 0.1–1.0)
Monocytes Relative: 4 %
Myelocytes: 4 %
Neutro Abs: 19.2 10*3/uL — ABNORMAL HIGH (ref 1.7–7.7)
Neutrophils Relative %: 65 %
Platelets: 213 10*3/uL (ref 150–400)
RBC: 4.18 MIL/uL — ABNORMAL LOW (ref 4.22–5.81)
RDW: 21.6 % — ABNORMAL HIGH (ref 11.5–15.5)
WBC: 25.6 10*3/uL — ABNORMAL HIGH (ref 4.0–10.5)
nRBC: 1.5 % — ABNORMAL HIGH (ref 0.0–0.2)

## 2018-11-09 ENCOUNTER — Other Ambulatory Visit: Payer: Self-pay | Admitting: Urgent Care

## 2018-11-09 DIAGNOSIS — D45 Polycythemia vera: Secondary | ICD-10-CM

## 2018-11-09 LAB — PATHOLOGIST SMEAR REVIEW

## 2018-11-12 ENCOUNTER — Other Ambulatory Visit: Payer: Self-pay | Admitting: Urgent Care

## 2018-11-15 ENCOUNTER — Telehealth: Payer: Self-pay | Admitting: Radiology

## 2018-11-15 NOTE — Telephone Encounter (Signed)
LMOM to return call regarding possible surgery.

## 2018-11-15 NOTE — Telephone Encounter (Signed)
-----   Message from Hollice Espy, MD sent at 10/25/2018  6:32 PM EST ----- If you havent't heard from this guy in a few weeks, can you follow up with him?  He likely wants a HoLEP

## 2018-11-17 NOTE — Telephone Encounter (Signed)
Patient returned the call.  He stated that he would like to proceed with scheduling HoLEP surgery with Dr. Erlene Quan.  He can be reached at  [phone].

## 2018-11-18 MED FILL — JAKAFI 25 MG TABLET: 25 | 30 days supply | Qty: 60 | Fill #0

## 2018-11-23 ENCOUNTER — Other Ambulatory Visit: Payer: Self-pay | Admitting: Radiology

## 2018-11-23 DIAGNOSIS — R339 Retention of urine, unspecified: Secondary | ICD-10-CM

## 2018-11-23 DIAGNOSIS — N138 Other obstructive and reflux uropathy: Secondary | ICD-10-CM

## 2018-11-23 DIAGNOSIS — N401 Enlarged prostate with lower urinary tract symptoms: Principal | ICD-10-CM

## 2018-11-23 NOTE — Telephone Encounter (Signed)
Discussed the [facility] Information form below with patient over the phone.    [facility], [address] [place], [place] [address] Telephone: [phone] Fax: [phone]   Thank you for choosing [facility] for your upcoming surgery!  We are always here to assist in your urological needs.  Please read the following information with specific details for your upcoming appointments related to your surgery. Please contact Sumayyah Custodio at [phone] Option 3 with any questions.  The Name of Your Surgery: Holmium laser enucleation of prostate Your Surgery Date: 12/29/2018 Your Surgeon: Hollice Espy  Please call Same Day Surgery at [phone] between the hours of 1pm-3pm one day prior to your surgery. They will inform you of the time to arrive at Same Day Surgery which is located on the second floor of the [facility].   Please refer to the attached letter regarding instructions for Pre-Admission Testing. You will receive a call from the [place] office regarding your appointment with them.  The Pre-Admission Testing office is located at [place], on the first floor of the [facility] in [place] (office is to the right as you enter through the [employer] of the [facility]). Please have all medications you are currently taking and your insurance card available.   Patient was advised to have nothing to eat or drink after midnight the night prior to surgery except that he may have only water until 2 hours before surgery with nothing to drink within 2 hours of surgery.  The patient states he currently takes no blood thinners.  Patient's questions were answered and he expressed understanding of these instructions.

## 2018-11-24 ENCOUNTER — Telehealth: Payer: Self-pay | Admitting: Radiology

## 2018-11-24 NOTE — Telephone Encounter (Signed)
Mr. Conran says Shanon Brow has caused him to have nose bleeds in the past. He is scheduled for a Holmium laser enucleation of the prostate on 12/29/2018. Should this be stopped prior to surgery? If so, how long does it need to be stopped?

## 2018-11-25 ENCOUNTER — Inpatient Hospital Stay: Payer: Medicare Other | Attending: Hematology and Oncology

## 2018-11-25 ENCOUNTER — Other Ambulatory Visit: Payer: Self-pay | Admitting: Urgent Care

## 2018-11-25 DIAGNOSIS — D751 Secondary polycythemia: Secondary | ICD-10-CM

## 2018-11-25 DIAGNOSIS — D45 Polycythemia vera: Secondary | ICD-10-CM | POA: Insufficient documentation

## 2018-11-25 DIAGNOSIS — R040 Epistaxis: Secondary | ICD-10-CM

## 2018-11-25 DIAGNOSIS — D7581 Myelofibrosis: Secondary | ICD-10-CM

## 2018-11-25 LAB — PLATELET FUNCTION ASSAY: Collagen / Epinephrine: 171 seconds (ref 0–193)

## 2018-11-25 NOTE — Telephone Encounter (Signed)
  Ruben [name],  I doubt that this is related to [facility].  He had nose bleeds in the past.  He has subsequently been off baby aspirin.  His von Willebrand panel was normal.  Platelet count normal.  PT normal.  PTT slightly elevated.  I think we should send a platelet function assay (PFA) and PTT with mix.  If the PTT with mix corrects, we will need to send factor levels.  If the PTT does not correct, we have an inhibitor and would consider an initial check of a lupus anticoagulant panel.  M

## 2018-11-26 DIAGNOSIS — D509 Iron deficiency anemia, unspecified: Secondary | ICD-10-CM | POA: Insufficient documentation

## 2018-11-26 LAB — PTT FACTOR INHIBITOR (MIXING STUDY): aPTT: 27.4 s (ref 22.9–30.2)

## 2018-12-09 ENCOUNTER — Telehealth: Payer: Self-pay | Admitting: Radiology

## 2018-12-09 NOTE — Telephone Encounter (Signed)
Notified patient that hospital administration has made the decision to postpone surgery at this time due to a shortage of supplies. Patient will be contacted to reschedule. He states he had planned to call to cancel anyway. Questions answered. Patient expresses understanding of conversation.

## 2018-12-17 MED FILL — JAKAFI 25 MG TABLET: 25 | 30 days supply | Qty: 60 | Fill #1

## 2018-12-20 ENCOUNTER — Other Ambulatory Visit: Payer: Medicare Other

## 2018-12-20 DIAGNOSIS — N1330 Unspecified hydronephrosis: Secondary | ICD-10-CM | POA: Diagnosis not present

## 2018-12-20 DIAGNOSIS — E872 Acidosis: Secondary | ICD-10-CM | POA: Diagnosis not present

## 2018-12-20 DIAGNOSIS — N2581 Secondary hyperparathyroidism of renal origin: Secondary | ICD-10-CM | POA: Diagnosis not present

## 2018-12-20 DIAGNOSIS — N183 Chronic kidney disease, stage 3 (moderate): Secondary | ICD-10-CM | POA: Diagnosis not present

## 2018-12-29 ENCOUNTER — Ambulatory Visit: Admit: 2018-12-29 | Payer: Medicare Other | Admitting: Urology

## 2018-12-29 SURGERY — ENUCLEATION, PROSTATE, USING LASER, WITH MORCELLATION
Anesthesia: Choice

## 2018-12-31 ENCOUNTER — Ambulatory Visit: Payer: Medicare Other

## 2019-01-03 ENCOUNTER — Other Ambulatory Visit: Payer: Self-pay

## 2019-01-04 ENCOUNTER — Inpatient Hospital Stay: Payer: Medicare Other | Attending: Hematology and Oncology

## 2019-01-04 ENCOUNTER — Other Ambulatory Visit: Payer: Self-pay

## 2019-01-04 ENCOUNTER — Telehealth: Payer: Self-pay | Admitting: Hematology and Oncology

## 2019-01-04 DIAGNOSIS — D45 Polycythemia vera: Secondary | ICD-10-CM | POA: Insufficient documentation

## 2019-01-04 DIAGNOSIS — D471 Chronic myeloproliferative disease: Secondary | ICD-10-CM

## 2019-01-04 LAB — CBC WITH DIFFERENTIAL/PLATELET
Abs Immature Granulocytes: 3.4 10*3/uL — ABNORMAL HIGH (ref 0.00–0.07)
Band Neutrophils: 6 %
Basophils Absolute: 0.5 10*3/uL — ABNORMAL HIGH (ref 0.0–0.1)
Basophils Relative: 2 %
Blasts: 1 %
Eosinophils Absolute: 0 10*3/uL (ref 0.0–0.5)
Eosinophils Relative: 0 %
HCT: 34.3 % — ABNORMAL LOW (ref 39.0–52.0)
Hemoglobin: 11 g/dL — ABNORMAL LOW (ref 13.0–17.0)
Lymphocytes Relative: 7 %
Lymphs Abs: 1.6 10*3/uL (ref 0.7–4.0)
MCH: 28 pg (ref 26.0–34.0)
MCHC: 32.1 g/dL (ref 30.0–36.0)
MCV: 87.3 fL (ref 80.0–100.0)
Metamyelocytes Relative: 6 %
Monocytes Absolute: 1.1 10*3/uL — ABNORMAL HIGH (ref 0.1–1.0)
Monocytes Relative: 5 %
Myelocytes: 8 %
Neutro Abs: 16 10*3/uL — ABNORMAL HIGH (ref 1.7–7.7)
Neutrophils Relative %: 64 %
Platelets: 148 10*3/uL — ABNORMAL LOW (ref 150–400)
Promyelocytes Relative: 1 %
RBC: 3.93 MIL/uL — ABNORMAL LOW (ref 4.22–5.81)
RDW: 21.3 % — ABNORMAL HIGH (ref 11.5–15.5)
WBC: 22.9 10*3/uL — ABNORMAL HIGH (ref 4.0–10.5)
nRBC: 0.7 % — ABNORMAL HIGH (ref 0.0–0.2)
nRBC: 1 /100 WBC — ABNORMAL HIGH

## 2019-01-04 LAB — COMPREHENSIVE METABOLIC PANEL
ALT: 16 U/L (ref 0–44)
AST: 41 U/L (ref 15–41)
Albumin: 4 g/dL (ref 3.5–5.0)
Alkaline Phosphatase: 64 U/L (ref 38–126)
Anion gap: 7 (ref 5–15)
BUN: 31 mg/dL — ABNORMAL HIGH (ref 8–23)
CO2: 23 mmol/L (ref 22–32)
Calcium: 9 mg/dL (ref 8.9–10.3)
Chloride: 105 mmol/L (ref 98–111)
Creatinine, Ser: 1.68 mg/dL — ABNORMAL HIGH (ref 0.61–1.24)
GFR calc Af Amer: 47 mL/min — ABNORMAL LOW (ref >60)
GFR calc non Af Amer: 40 mL/min — ABNORMAL LOW (ref >60)
Glucose, Bld: 98 mg/dL (ref 70–99)
Potassium: 3.9 mmol/L (ref 3.5–5.1)
Sodium: 135 mmol/L (ref 135–145)
Total Bilirubin: 0.9 mg/dL (ref 0.3–1.2)
Total Protein: 7.9 g/dL (ref 6.5–8.1)

## 2019-01-04 LAB — LIPID PANEL
Cholesterol: 150 mg/dL (ref 0–200)
HDL: 29 mg/dL — ABNORMAL LOW (ref >40)
LDL Cholesterol: 93 mg/dL (ref 0–99)
Total CHOL/HDL Ratio: 5.2 RATIO
Triglycerides: 141 mg/dL (ref <150)
VLDL: 28 mg/dL (ref 0–40)

## 2019-01-04 LAB — LACTATE DEHYDROGENASE: LDH: 702 U/L — ABNORMAL HIGH (ref 98–192)

## 2019-01-04 LAB — FERRITIN: Ferritin: 89 ng/mL (ref 24–336)

## 2019-01-05 ENCOUNTER — Other Ambulatory Visit: Payer: Medicare Other

## 2019-01-05 ENCOUNTER — Encounter: Payer: Self-pay | Admitting: Hematology and Oncology

## 2019-01-05 ENCOUNTER — Inpatient Hospital Stay (HOSPITAL_BASED_OUTPATIENT_CLINIC_OR_DEPARTMENT_OTHER): Payer: Medicare Other | Admitting: Hematology and Oncology

## 2019-01-05 DIAGNOSIS — D509 Iron deficiency anemia, unspecified: Secondary | ICD-10-CM

## 2019-01-05 DIAGNOSIS — Z7189 Other specified counseling: Secondary | ICD-10-CM

## 2019-01-05 DIAGNOSIS — D7581 Myelofibrosis: Secondary | ICD-10-CM

## 2019-01-05 DIAGNOSIS — D45 Polycythemia vera: Secondary | ICD-10-CM | POA: Diagnosis not present

## 2019-01-05 NOTE — Progress Notes (Signed)
[name]  [address], [address] [place], [place] [address] Phone: [phone]  Fax: [phone]   Telemedicine Office Visit:  01/05/2019  Referring physician: [name], MD  I connected with [name] on 01/05/19 at 2:55 PM EDT by videoconferencing and verified that I was speaking with the correct person using 2 identifiers.  The patient was at home.  I discussed the limitations, risk, security and privacy concerns of performing an evaluation and management service by videoconferencing and the availability of in person appointments.  I also discussed with the patient that there may be a patient responsible charge related to this service.  The patient expressed understanding and agreed to proceed.    Chief Complaint: Ruben [name] is a 73 y.o. 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  HPI: The patient was last seen in the medical oncology clinic on 10/06/2017.  At that time, he was doing well.  He had squamous cell cancers x 2 removed.  He had no further epistaxis off aspirin.  Exam revealed no adenopathy or hepatosplenomegaly. Hematocrit was 39.7 and hemoglobin 12.4.  Platelets were 216,000.  WBC was 23,600 with an Latimer of 14,400.  Ferritin was 37.  LDH was 820.  Von Willebrand panel was normal.  PT was 14.1 (INR 1.1). PTT was 41 with a repeat of 27.4 on 11/25/2018.  He saw Dr. Hollice Espy on 10/12/2018.  He has BPH with outlet obstruction.  Discuss was held regarding holmium laser enucleation of the prostate (HoLEP) vs. open simple prostatectomy vs. robotic simple prostatectomy.  During the interim, he has done well.  He describes having "cabin fever" because of the stay at home order because of the COVID-19 pandemic. Weight is stable or slightly up.  He denies any fevers or sweats.  He denies any excess bruising or bleeding.  He states that he has decided to "drop surgery" (laser prostate surgery).   Past  Medical History:  Diagnosis Date   Blood dyscrasia    BPH (benign prostatic hyperplasia)    Cancer (HCC)    SKIN/ POLYCYTHEMIA VERA   Chronic kidney disease    RENAL INSUFF   Crohn disease ([place])    Crohn's disease ([place])    Glaucoma    Glaucoma    Gout    History of chicken pox    Nosebleed    RBBB    Right bundle branch block     Past Surgical History:  Procedure Laterality Date   CATARACT EXTRACTION W/PHACO Left 07/13/2018   Procedure: CATARACT EXTRACTION PHACO AND INTRAOCULAR LENS PLACEMENT ([place]);  Surgeon: [name], MD;  Location: [facility] ORS;  Service: Ophthalmology;  Laterality: Left;  [place] 00:39  CDE 5.14 Fluid pack lot # [id number]   CATARACT EXTRACTION W/PHACO Right 08/03/2018   Procedure: CATARACT EXTRACTION PHACO AND INTRAOCULAR LENS PLACEMENT ([facility]);  Surgeon: [name], MD;  Location: [facility] ORS;  Service: Ophthalmology;  Laterality: Right;  [place] 00:43.4 CDE 5.23 Fluid pack Lot # [id number]   COLONOSCOPY WITH PROPOFOL N/A 03/27/2017   Procedure: COLONOSCOPY WITH PROPOFOL;  Surgeon: [name], MD;  Location: [facility]COPY;  Service: Endoscopy;  Laterality: N/A;   [place] [facility]  2008   Prostate Biopsy in [place] to elevated PSA.  per patient normal   Skin Lesion Basal cell removed     wart removal     from eyelid  Family History  Problem Relation Age of Onset   Cancer Mother        Liver   Cancer Father        Prostate   Cancer Sister        Hodgkins lymphoma   Cancer Paternal Aunt        Breast    Social History:  reports that he quit smoking about 15 years ago. His smoking use included cigarettes. He has a 5.00 pack-year smoking history. He has never used smokeless tobacco. He reports current alcohol use of about 4.0 - 5.0 standard drinks of alcohol per week. He reports that he does not use drugs.  He previously worked for [employer].  He no longer volunteers for [employer].   The patient's wife is [name].  Participants in the patient's visit and their role in the encounter included the patient, his wife [name]), and [name], CMA, today.  The intake visit was provided by [name], CMA.    Allergies: No Known Allergies  Current Medications: Current Outpatient Medications  Medication Sig Dispense Refill   allopurinol (ZYLOPRIM) 300 MG tablet TAKE 1 TABLET BY MOUTH EVERY DAY 90 tablet 0   Cholecalciferol (VITAMIN D3) 1000 units CAPS Take 1,000 Units by mouth daily at 12 noon.      Cyanocobalamin (RA VITAMIN B-12 TR) 1000 MCG TBCR Take 1,000 mcg by mouth daily at 12 noon.      ferrous sulfate 325 (65 FE) MG tablet Take 325 mg by mouth every Monday, Wednesday, and Friday.      finasteride (PROSCAR) 5 MG tablet Take 1 tablet (5 mg total) by mouth every evening. 90 tablet 3   folic acid (FOLVITE) 1 MG tablet Take 1 mg by mouth daily at 12 noon.      JAKAFI 25 MG tablet TAKE 1 TABLET (25 MG TOTAL) BY MOUTH 2 TIMES DAILY. 60 tablet 5   latanoprost (XALATAN) 0.005 % ophthalmic solution Place 1 drop into both eyes at bedtime.      Multiple Vitamins-Minerals (CENTRUM SILVER ULTRA MENS) TABS Take 1 tablet by mouth daily at 12 noon.      Omega-3 Fatty Acids (KP FISH OIL) 1200 MG CAPS Take 1,200 mg by mouth daily.      sodium bicarbonate 650 MG tablet Take 650 mg by mouth 2 (two) times daily.     timolol (TIMOPTIC) 0.5 % ophthalmic solution Place 1 drop into both eyes daily.  5   acetaminophen (TYLENOL) 500 MG tablet Take 500 mg by mouth daily as needed for moderate pain.     No current facility-administered medications for this visit.     Review of Systems  Constitutional: Negative.  Negative for chills, diaphoresis, fever, malaise/fatigue and weight loss.       Feels "ok". He has "cabin fever".  HENT: Negative.  Negative for congestion, ear discharge, ear pain, nosebleeds, sinus pain, sore throat and tinnitus.   Eyes: Negative for blurred  vision, double vision, photophobia, pain and discharge.       S/p cataract surgery.  Uses reading glasses.  Respiratory: Negative.  Negative for cough, hemoptysis, sputum production and shortness of breath.   Cardiovascular: Negative.  Negative for chest pain, palpitations, orthopnea, leg swelling and PND.  Gastrointestinal: Negative.  Negative for abdominal pain, blood in stool, constipation, diarrhea (at times), heartburn, melena, nausea and vomiting.  Genitourinary: Negative for dysuria, frequency, hematuria and urgency.       Urinary catheterization (self) 4x/day.  Postponed prostate surgery.  Musculoskeletal: Negative.  Negative for back pain, falls, joint pain, myalgias and neck pain.  Skin: Negative.  Negative for itching and rash.  Neurological: Negative.  Negative for dizziness, tingling, tremors, sensory change, speech change, focal weakness, weakness and headaches.  Endo/Heme/Allergies: Negative.  Does not bruise/bleed easily.  Psychiatric/Behavioral: Negative.  Negative for depression and memory loss. The patient is not nervous/anxious and does not have insomnia.   All other systems reviewed and are negative.  Performance status (ECOG): 1  Vital Signs There were no vitals taken for this visit.  Physical Exam  Constitutional: He is oriented to person, place, and time and well-developed, well-nourished, and in no distress. No distress.  HENT:  Head: Normocephalic and atraumatic.  Short gray hair.  Eyes: Conjunctivae are normal. No scleral icterus.  Dark rimmed glasses.  Blue eyes.  Neurological: He is alert and oriented to person, place, and time.  Skin: He is not diaphoretic.  Psychiatric: Mood, affect and judgment normal.    Appointment on 01/04/2019  Component Date Value Ref Range Status   Ferritin 01/04/2019 89  24 - 336 ng/mL Final   Performed at [facility], [address] [place], [place] [address]   LDH 01/04/2019 702* 98 - 192 U/L Final   Performed  at [facility], [address], [place], [place] [address]   Sodium 01/04/2019 135  135 - 145 mmol/L Final   Potassium 01/04/2019 3.9  3.5 - 5.1 mmol/L Final   Chloride 01/04/2019 105  98 - 111 mmol/L Final   CO2 01/04/2019 23  22 - 32 mmol/L Final   Glucose, Bld 01/04/2019 98  70 - 99 mg/dL Final   BUN 01/04/2019 31* 8 - 23 mg/dL Final   Creatinine, Ser 01/04/2019 1.68* 0.61 - 1.24 mg/dL Final   Calcium 01/04/2019 9.0  8.9 - 10.3 mg/dL Final   Total Protein 01/04/2019 7.9  6.5 - 8.1 g/dL Final   Albumin 01/04/2019 4.0  3.5 - 5.0 g/dL Final   AST 01/04/2019 41  15 - 41 U/L Final   ALT 01/04/2019 16  0 - 44 U/L Final   Alkaline Phosphatase 01/04/2019 64  38 - 126 U/L Final   Total Bilirubin 01/04/2019 0.9  0.3 - 1.2 mg/dL Final   GFR calc non Af Amer 01/04/2019 40* >60 mL/min Final   GFR calc Af Amer 01/04/2019 47* >60 mL/min Final   Anion gap 01/04/2019 7  5 - 15 Final   Performed at [facility], [address], [place], [place] [address]   WBC 01/04/2019 22.9* 4.0 - 10.5 K/uL Final   RBC 01/04/2019 3.93* 4.22 - 5.81 MIL/uL Final   Hemoglobin 01/04/2019 11.0* 13.0 - 17.0 g/dL Final   HCT 01/04/2019 34.3* 39.0 - 52.0 % Final   MCV 01/04/2019 87.3  80.0 - 100.0 fL Final   MCH 01/04/2019 28.0  26.0 - 34.0 pg Final   MCHC 01/04/2019 32.1  30.0 - 36.0 g/dL Final   RDW 01/04/2019 21.3* 11.5 - 15.5 % Final   Platelets 01/04/2019 148* 150 - 400 K/uL Final   nRBC 01/04/2019 0.7* 0.0 - 0.2 % Final   Neutrophils Relative % 01/04/2019 64  % Final   Neutro Abs 01/04/2019 16.0* 1.7 - 7.7 K/uL Final   Band Neutrophils 01/04/2019 6  % Final   Lymphocytes Relative 01/04/2019 7  % Final   Lymphs Abs 01/04/2019 1.6  0.7 - 4.0 K/uL Final   Monocytes Relative 01/04/2019  5  % Final   Monocytes Absolute 01/04/2019 1.1* 0.1 - 1.0 K/uL Final   Eosinophils Relative 01/04/2019 0  % Final   Eosinophils Absolute 01/04/2019 0.0  0.0 - 0.5 K/uL  Final   Basophils Relative 01/04/2019 2  % Final   Basophils Absolute 01/04/2019 0.5* 0.0 - 0.1 K/uL Final   nRBC 01/04/2019 1* 0 /100 WBC Final   Metamyelocytes Relative 01/04/2019 6  % Final   Myelocytes 01/04/2019 8  % Final   Promyelocytes Relative 01/04/2019 1  % Final   Blasts 01/04/2019 1  % Final   Abs Immature Granulocytes 01/04/2019 3.40* 0.00 - 0.07 K/uL Final   Polychromasia 01/04/2019 PRESENT   Final   Performed at [facility], [address] [place], [place] [address]   Cholesterol 01/04/2019 150  0 - 200 mg/dL Final   Triglycerides 01/04/2019 141  <150 mg/dL Final   HDL 01/04/2019 29* >40 mg/dL Final   Total CHOL/HDL Ratio 01/04/2019 5.2  RATIO Final   VLDL 01/04/2019 28  0 - 40 mg/dL Final   LDL Cholesterol 01/04/2019 93  0 - 99 mg/dL Final   Comment:        Total Cholesterol/HDL:CHD Risk Coronary Heart Disease Risk Table                     Men   Women  1/2 Average Risk   3.4   3.3  Average Risk       5.0   4.4  2 X Average Risk   9.6   7.1  3 X Average Risk  23.4   11.0        Use the calculated Patient Ratio above and the CHD Risk Table to determine the patient's CHD Risk.        ATP III CLASSIFICATION (LDL):  <100     mg/dL   Optimal  100-129  mg/dL   Near or Above                    Optimal  130-159  mg/dL   Borderline  160-189  mg/dL   High  >190     mg/dL   Very High Performed at [facility], [address], [place], [place] [address]     Assessment:  SUSAN ARANA is a 73 y.o. 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  He has had polycythemia dating back to 2013.  Hematocrit was 62.1 with a hemoglobin of 19.8 on 03/28/2015.  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  Erythropoietin level was 1.1 (low).  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  He began a phlebotomy program on 03/28/2015 to maintain a hematocrit goal of < 45.  He last underwent phlebotomy on 05/02/2015.  He is on a baby aspirin.  He  is followed by GI (Dr. Vira Agar) for a history of polyps and Crohn's disease.  Last colonoscopy was 3-4 years ago.  He has become iron deficient on his phlebotomy program (ferritin 21 on 11/28/2015).   CBC on 11/28/2015 revealed a hematocrit of 39.3, hemoglobin 11.9, platelets 463,000, WBC 32,000 with an ANC of 25,000.  Differential included 71% segs, 4% lymphocytes and 17% monocytes.  Peripheral smear revealed leukocytosis with predominantly mature neutrophils, increased monocytes and rare blasts (<1%).  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  Marrow was packed (95-100% cellularity) with pan myelosis, multi-lineage dyspoiesis, and no significant  increase in blasts.  There was moderate to focally marked reticulin fibrosis (grade 2-3/3).  Storage iron was not identified.  Flow cytometry revealed non-specific atypical myeloid findings with no increase in blasts.  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  Cytogenetics were normal (46, XY).  Bone marrow on 04/07/2016 at [facility] revealed a hypercellular marrow (> 95%) with persistent involvement by myeloid neoplasm with 5% blasts.  There was mild reticulin fibrosis.  FISH t(9;22) results were normal.  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  Cytogenetics are pending.  Bone marrow on 08/09/2018 revealed a hypercellular bone marrow (>95%) with persistent involvement by myeloid neoplasm with <1% blasts by manual touch preparation differential.  There was marked reticulin fibrosis with focal collagen deposition.  Cytogenetics were normal (46, XY).  -  Myeloid mutation panel study is pending.  He was briefly on hydroxyurea 500 mg a day (12/05/2015 - 12/19/2015 and 06/25/2016 - 07/03/2016).   Platelet count increased to 1.18  million and white count to 54,800 without increased blasts on 06/25/2016.  CBC on 07/02/2016 revealed a platelets of 429,000 with a white count 30,600.  He began allopurinol on 12/19/2015.  He began Jakafi 20 mg BID on 05/28/2016.  He has been on Jakafi 25 mg BID since 05/26/2017.  LDH is followed:  466 on 01/02/2016, 596 on 05/23/2016, 663 on 07/30/2016, 522 on 09/24/2016, 638 on 01/05/2017, 625 on 02/25/2017, 1144 on 06/10/2017, 720 on 06/17/2017, 728 on 06/24/2017, and 724 on 01/06/2018.  Triglycerides were 183 (< 150) on 08/20/2016, 180 on 01/05/2017, 312 on 06/17/2017, 165 on 11/25/2017, 113 on 02/17/2018.  He has urinary retention.  Renal ultrasound on 07/28/2016 revealed bilateral hydronephrosis (right > left) and a large postvoid residual (2 liters) suggesting bladder dysfunction or outlet obstruction.  He underwent temporary Foley catheter placement.  He performs I/O self catheterizations.  He is on Flomax and finasteride.  PSA was 2.02 on 07/09/2016.  He has chronic renal insufficiency (Cr 1.85; CrCl 36 ml/min).  SPEP on 09/23/2016 was negative.  Spot urine revealed 17.4% of 27.3 mg/dL.of a monoclonal protein.  24 hour urine on 10/16/2016 revealed no monoclonal protein.  Renal function transiently decreased in 06/2017 after Bactrim.  He is followed by nephrology and urology.  He has a history of epistaxis.  Normal studies included:  PT, PTT, von Willebrand panel, and platelet function assay (PFA).  Symptomatically, he is doing well.  He has decided to postpone his prostate surgery.  Hemoglobin is 11.0.  WBC 22,900 with 1% blasts.  Platelet count is 148,000.  Plan: 1. Review labs from yesterday. 2.  Polycythemia rubra vera   Clinically stable.   WBC 22,900 (stable).  LDH 702 (improved).   Peripheral blasts 1%.   Continue Jakafi.   Consider fedratinib (Inrebic) if progressive splenomegaly or cytopenias.   Consider azacitidine (Vidaza) if accelerated phase.   Follow-up with Dr Adriana Simas on 01/10/2019 (note sent). 3.  Iron deficiency   Patient on oral iron on Mondays, Wednesdays, and Fridays.   Hemoglobin 11.0.  Ferritin 89.   Continue monitoring. 4.  Epistaxis   No interval epistaxis.   Patient is off aspirin.   No other bleeding or excess bruising.   Suspect anatomic issue rather than bleeding diathesis.   PT, PTT, von Willebrand panel, and PFA normal. 5.  RTC 3 months for MD assessment and labs (CBC with diff, CMP, LDH, uric acid,  ferritin).   I discussed the assessment and treatment plan with the patient.  The patient was provided an opportunity to ask questions and all were answered.  The patient agreed with the plan and demonstrated an understanding of the instructions.  The patient was advised to call back or seek an in person evaluation if the symptoms worsen or if the condition fails to improve as anticipated.  I provided 22 minutes (2:55 PM - 3:17 PM) of face-to-face video visit time during this this encounter and > 50% was spent counseling as documented under my assessment and plan.  I provided these services from the [facility] office.   Lequita Asal, MD, PhD  01/05/2019, 2:55 PM

## 2019-01-10 ENCOUNTER — Telehealth: Admit: 2019-01-10 | Discharge: 2019-01-11 | Payer: MEDICARE | Attending: Adult Health | Primary: Adult Health

## 2019-01-10 ENCOUNTER — Ambulatory Visit: Admit: 2019-01-10 | Discharge: 2019-01-11 | Payer: MEDICARE

## 2019-01-10 DIAGNOSIS — D45 Polycythemia vera: Secondary | ICD-10-CM | POA: Diagnosis not present

## 2019-01-10 DIAGNOSIS — D508 Other iron deficiency anemias: Secondary | ICD-10-CM | POA: Diagnosis not present

## 2019-01-10 DIAGNOSIS — D471 Chronic myeloproliferative disease: Secondary | ICD-10-CM | POA: Diagnosis not present

## 2019-01-19 MED FILL — JAKAFI 25 MG TABLET: 25 | 30 days supply | Qty: 60 | Fill #2

## 2019-01-24 ENCOUNTER — Telehealth: Payer: Self-pay | Admitting: Pharmacy Technician

## 2019-01-24 NOTE — Telephone Encounter (Signed)
Oral Oncology Patient Advocate Encounter   Was successful in securing patient a $7000 grant from [employer] to provide copayment coverage for Jakafi. This will keep the out of pocket expense at $0.     The billing information is as follows and has been shared with [place].   Member ID: [id number] Group ID: [facility] RxBin: [id number] PCN: [id number] Dates of Eligibility: 01/24/2019 through 01/24/2020  [place] Patient Ruben [name] Phone [phone] Fax [phone] 01/24/2019 9:49 AM

## 2019-02-08 ENCOUNTER — Ambulatory Visit: Payer: Medicare Other | Admitting: Urology

## 2019-02-16 ENCOUNTER — Encounter: Payer: Self-pay | Admitting: Hematology and Oncology

## 2019-02-16 MED FILL — JAKAFI 25 MG TABLET: 25 | 30 days supply | Qty: 60 | Fill #3

## 2019-02-21 ENCOUNTER — Telehealth: Payer: Self-pay

## 2019-02-21 ENCOUNTER — Other Ambulatory Visit: Payer: Self-pay | Admitting: Radiology

## 2019-02-21 ENCOUNTER — Other Ambulatory Visit: Payer: Self-pay

## 2019-02-21 DIAGNOSIS — N401 Enlarged prostate with lower urinary tract symptoms: Secondary | ICD-10-CM

## 2019-02-21 DIAGNOSIS — N138 Other obstructive and reflux uropathy: Secondary | ICD-10-CM

## 2019-02-21 DIAGNOSIS — R339 Retention of urine, unspecified: Secondary | ICD-10-CM

## 2019-02-21 MED ORDER — ALLOPURINOL 300 MG PO TABS: 300.0000 mg | ORAL_TABLET | Freq: Every day | ORAL | 0 refills | Status: DC

## 2019-02-21 NOTE — Telephone Encounter (Signed)
Allopution 300 mg daily has been refilled #90 with no refills ( 90 day supply)

## 2019-02-23 ENCOUNTER — Telehealth: Payer: Self-pay | Admitting: Radiology

## 2019-02-23 NOTE — Telephone Encounter (Signed)
Discussed the [facility] Information form below over the phone with patient.   [facility], [place], [place] [address] Telephone: [phone] Fax: [phone]   Thank you for choosing [facility] for your upcoming surgery!  We are always here to assist in your urological needs.  Please read the following information with specific details for your upcoming appointments related to your surgery. Please contact Mike Berntsen at [phone] Option 3 with any questions.  The Name of Your Surgery: Holmium laser enucleation of prostate Your Surgery Date: 03/07/2019 Your Surgeon: Hollice Espy  Please call Same Day Surgery at [phone] between the hours of 1pm-3pm one day prior to your surgery. They will inform you of the time to arrive at Same Day Surgery which is located on the second floor of the [facility].   Please refer to the attached letter regarding instructions for Pre-Admission Testing. You will receive a call from the [place] office regarding your appointment with them.  The Pre-Admission Testing office is located at [place], on the first floor of the [place] [facility] in [place] (office is to the right as you enter through the [employer] of the [facility]). Please have all medications you are currently taking and your insurance card available.  Informed patient that COVID-19 testing is required prior to surgery and he will need to quarantine until the day of surgery.  Patient was advised to have nothing to eat or drink after midnight the night prior to surgery except that he may have only water until 2 hours before surgery with nothing to drink within 2 hours of surgery.  The patient states he currently takes no blood thinners. Patient's questions were answered and he expressed understanding of  these instructions.

## 2019-02-23 NOTE — Telephone Encounter (Signed)
LMOM to return call regarding surgery with Dr Erlene Quan.

## 2019-02-24 ENCOUNTER — Telehealth: Payer: Self-pay

## 2019-02-24 NOTE — Telephone Encounter (Signed)
Patient called to cancel surgery due to COVID-19 & other personal reasons. He agreed to keep cystoscopy appointment in January 2021.

## 2019-02-24 NOTE — Telephone Encounter (Signed)
-----   Message from Secundino Ginger sent at 02/24/2019  8:32 AM EDT ----- Regarding: refill Contact: [phone] Pt sent a mychart msg and I sent a msg about his refill on Allopurinol 300 mg tablet once a day. He wants to know if he still needs to take it and if so can he get a refill?

## 2019-02-24 NOTE — Telephone Encounter (Signed)
Spoke with ms Bethena Roys to inform her that Allopurinol 300 mg 1 ab po daily has been refilled for Mr Cordts. Mrs Kirkpatrick was understanding

## 2019-02-28 ENCOUNTER — Ambulatory Visit (INDEPENDENT_AMBULATORY_CARE_PROVIDER_SITE_OTHER): Payer: Medicare Other

## 2019-02-28 ENCOUNTER — Other Ambulatory Visit: Payer: Medicare Other

## 2019-02-28 ENCOUNTER — Other Ambulatory Visit: Payer: Self-pay

## 2019-02-28 DIAGNOSIS — Z0000 Encounter for general adult medical examination without abnormal findings: Secondary | ICD-10-CM

## 2019-02-28 NOTE — Progress Notes (Signed)
Subjective:   Ruben [name] is a 73 y.o. male who presents for Medicare Annual/Subsequent preventive examination.    This visit is being conducted through telemedicine due to the COVID-19 pandemic. This patient has given me verbal consent via doximity to conduct this visit, patient states they are participating from their home address. Some vital signs may be absent or patient reported.    Patient identification: identified by name, DOB, and current address  Review of Systems:  N/A  Cardiac Risk Factors include: advanced age (>24mn, >>85women);male gender     Objective:    Vitals: There were no vitals taken for this visit.  There is no height or weight on file to calculate BMI. Unable to obtain vitals due to visit being conducted via telephonically.   Advanced Directives 02/28/2019 01/05/2019 10/06/2018 08/03/2018 07/13/2018 07/07/2018 04/07/2018  Does Patient Have a Medical Advance Directive? Yes No No Yes Yes Yes Yes  Type of AParamedicof [place]Living will - - [place] will [place] will - -  Does patient want to make changes to medical advance directive? - - - No - Patient declined No - Patient declined - -  Copy of [place]? Yes - validated most recent copy scanned in chart (See row information) - - No - copy requested No - copy requested - -  Would patient like information on creating a medical advance directive? - No - Patient declined No - Patient declined - - - -    Tobacco Social History   Tobacco Use  Smoking Status Former Smoker  . Packs/day: 0.25  . Years: 20.00  . Pack years: 5.00  . Types: Cigarettes  . Last attempt to quit: 09/28/2003  . Years since quitting: 15.4  Smokeless Tobacco Never Used     Counseling given: Not Answered   Clinical Intake:  Pre-visit preparation completed: Yes  Pain : No/denies pain Pain Score: 0-No pain      Nutritional Status: BMI 25 -29 Overweight Nutritional Risks: Nausea/ vomitting/ diarrhea(Diarrhea occasionally due to diet.) Diabetes: No  How often do you need to have someone help you when you read instructions, pamphlets, or other written materials from your doctor or pharmacy?: 1 - Never  Interpreter Needed?: No  Information entered by :: [facility] L[facility]  Past Medical History:  Diagnosis Date  . Blood dyscrasia   . BPH (benign prostatic hyperplasia)   . 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 Chronic kidney disease    RENAL INSUFF  . Crohn disease ([place]   . Crohn's disease ([place]   . Glaucoma   . Glaucoma   . Gout   . History of chicken pox   . Nosebleed   . RBBB   . Right bundle branch block    Past Surgical History:  Procedure Laterality Date  . CATARACT EXTRACTION W/PHACO Left 07/13/2018   Procedure: CATARACT EXTRACTION PHACO AND INTRAOCULAR LENS PLACEMENT (IOC);  Surgeon: PBirder Robson MD;  Location: [facility] ORS;  Service: Ophthalmology;  Laterality: Left;  [id number]  CDE 5.14 Fluid pack lot # [id number]  . CATARACT EXTRACTION W/PHACO Right 08/03/2018   Procedure: CATARACT EXTRACTION PHACO AND INTRAOCULAR LENS PLACEMENT (IOC);  Surgeon: PBirder Robson MD;  Location: [facility] ORS;  Service: Ophthalmology;  Laterality: Right;  [id number]:43.4 CDE 5.23 Fluid pack Lot # [id number]  . COLONOSCOPY WITH PROPOFOL N/A 03/27/2017   Procedure: COLONOSCOPY WITH PROPOFOL;  Surgeon: EGaylyn Cheers  T, MD;  Location: [facility] ENDOSCOPY;  Service: Endoscopy;  Laterality: N/A;  . MOHS SURGERY     EAR  . PROSTATE SURGERY  2008   Prostate Biopsy in [place] to elevated PSA.  per patient normal  . Skin Lesion Basal cell removed    . wart removal     from eyelid   Family History  Problem Relation Age of Onset  . Cancer Mother        Liver  . Cancer Father        Prostate  . Cancer Sister        Hodgkins lymphoma  . Cancer Paternal Aunt        Breast   Social History    Socioeconomic History  . Marital status: Married    Spouse name: Not on file  . Number of children: 2  . Years of education: Not on file  . Highest education level: Bachelor's degree (e.g., BA, AB, BS)  Occupational History  . Occupation: Retired  Scientific laboratory technician  . Financial resource strain: Not hard at all  . Food insecurity:    Worry: Never true    Inability: Never true  . Transportation needs:    Medical: No    Non-medical: No  Tobacco Use  . Smoking status: Former Smoker    Packs/day: 0.25    Years: 20.00    Pack years: 5.00    Types: Cigarettes    Last attempt to quit: 09/28/2003    Years since quitting: 15.4  . Smokeless tobacco: Never Used  Substance and Sexual Activity  . Alcohol use: Yes    Alcohol/week: 4.0 - 5.0 standard drinks    Types: 4 - 5 Glasses of wine per week  . Drug use: No  . Sexual activity: Not on file  Lifestyle  . Physical activity:    Days per week: 0 days    Minutes per session: 0 min  . Stress: Not at all  Relationships  . Social connections:    Talks on phone: Patient refused    Gets together: Patient refused    Attends religious service: Patient refused    Active member of club or organization: Patient refused    Attends meetings of clubs or organizations: Patient refused    Relationship status: Patient refused  Other Topics Concern  . Not on file  Social History Narrative  . Not on file    Outpatient Encounter Medications as of 02/28/2019  Medication Sig  . acetaminophen (TYLENOL) 500 MG tablet Take 500 mg by mouth daily as needed for moderate pain.  Marland Kitchen allopurinol (ZYLOPRIM) 300 MG tablet Take 1 tablet (300 mg total) by mouth daily.  . Cholecalciferol (VITAMIN D3) 1000 units CAPS Take 1,000 Units by mouth daily at 12 noon.   . Cyanocobalamin (RA VITAMIN B-12 TR) 1000 MCG TBCR Take 1,000 mcg by mouth daily at 12 noon.   . ferrous sulfate 325 (65 FE) MG tablet Take 325 mg by mouth every Monday, Wednesday, and Friday.   . finasteride  (PROSCAR) 5 MG tablet Take 1 tablet (5 mg total) by mouth every evening.  . folic acid (FOLVITE) 1 MG tablet Take 1 mg by mouth daily at 12 noon.   Marland Kitchen JAKAFI 25 MG tablet TAKE 1 TABLET (25 MG TOTAL) BY MOUTH 2 TIMES DAILY.  Marland Kitchen latanoprost (XALATAN) 0.005 % ophthalmic solution Place 1 drop into both eyes at bedtime.   . Multiple Vitamins-Minerals (CENTRUM SILVER ULTRA MENS) TABS Take 1 tablet by  mouth daily at 12 noon.   . Omega-3 Fatty Acids (KP FISH OIL) 1200 MG CAPS Take 1,200 mg by mouth daily.   . sodium bicarbonate 650 MG tablet Take 650 mg by mouth 2 (two) times daily.  . timolol (TIMOPTIC) 0.5 % ophthalmic solution Place 1 drop into both eyes daily. In AM   No facility-administered encounter medications on file as of 02/28/2019.     Activities of Daily Living In your present state of health, do you have any difficulty performing the following activities: 02/28/2019  Hearing? N  Vision? N  Comment Wears eye glasses daily.   Difficulty concentrating or making decisions? N  Walking or climbing stairs? N  Dressing or bathing? N  Doing errands, shopping? N  Preparing Food and eating ? N  Using the Toilet? N  In the past six months, have you accidently leaked urine? Y  Comment Uses a catheter to void.   Do you have problems with loss of bowel control? N  Managing your Medications? N  Managing your Finances? N  Housekeeping or managing your Housekeeping? N  Some recent data might be hidden    Patient Care Team: [name], MD as PCP - General (Family Medicine) [name], MD (Gastroenterology) Dingeldein, Remo Lipps, MD (Ophthalmology) Lequita Asal, MD as Referring Physician (Hematology and Oncology) Dasher, Rayvon Char, MD as Referring Physician (Dermatology) Anthonette Legato, MD as Consulting Physician (Internal Medicine) Hollice Espy, MD as Consulting Physician (Urology) Crissie Sickles, MD as Referring Physician (Hematology and Oncology)   Assessment:   This is  a routine wellness examination for Parsa.  Exercise Activities and Dietary recommendations Current Exercise Habits: The patient does not participate in regular exercise at present, Exercise limited by: None identified  Goals    . DIET - INCREASE WATER INTAKE     Recommend to increase water intake to 4-6 glasses a day.     . Increase water intake     Recommend increasing water intake to 4-6 glasses a day.    Marland Kitchen LIFESTYLE - DECREASE FALLS RISK     Recommend to remove any items from the home that may cause slips or trips.        Fall Risk: Fall Risk  02/28/2019 02/22/2018 04/13/2017 02/20/2017 08/27/2015  Falls in the past year? 1 No Yes Yes No  Comment - - Emmi Telephone Survey: data to providers prior to load - -  Number falls in past yr: 0 - 1 1 -  Comment - - Emmi Telephone Survey Actual Response = 1 - -  Injury with Fall? 0 - Yes No -  Follow up Falls prevention discussed - - Falls prevention discussed -    FALL RISK PREVENTION PERTAINING TO THE HOME:  Any stairs in or around the home? Yes  If so, are there any without handrails? Yes   Home free of loose throw rugs in walkways, pet beds, electrical cords, etc? Yes  Adequate lighting in your home to reduce risk of falls? Yes   ASSISTIVE DEVICES UTILIZED TO PREVENT FALLS:  Life alert? No  Use of a cane, walker or w/c? No  Grab bars in the bathroom? Yes  Shower chair or bench in shower? Yes  Elevated toilet seat or a handicapped toilet? No   TIMED UP AND GO:  Was the test performed? No .    Depression Screen PHQ 2/9 Scores 02/28/2019 02/22/2018 02/20/2017 02/20/2017  PHQ - 2 Score 1 0 0 0  PHQ- 9  Score - - 0 -    Cognitive Function: Declined today.      6CIT Screen 02/20/2017  What Year? 0 points  What month? 0 points  What time? 0 points  Count back from 20 0 points  Months in reverse 0 points  Repeat phrase 2 points  Total Score 2    Immunization History  Administered Date(s) Administered  . Influenza, High Dose  Seasonal PF 06/23/2018  . Influenza,inj,Quad PF,6+ Mos 07/06/2017  . Pneumococcal Conjugate-13 04/10/2014  . Pneumococcal Polysaccharide-23 05/03/2012, 02/22/2018  . Td 02/11/2002  . Zoster 07/10/2014    Qualifies for Shingles Vaccine? Yes  Zostavax completed 07/10/14. Due for Shingrix. Education has been provided regarding the importance of this vaccine. Pt has been advised to call insurance company to determine out of pocket expense. Advised may also receive vaccine at local pharmacy or Health Dept. Verbalized acceptance and understanding.  Tdap: Up to date  Flu Vaccine: Up to date  Pneumococcal Vaccine: Completed series  Screening Tests Health Maintenance  Topic Date Due  . INFLUENZA VACCINE  04/23/2019  . TETANUS/TDAP  09/22/2020  . COLONOSCOPY  03/27/2022  . Hepatitis C Screening  Completed  . PNA vac Low Risk Adult  Completed   Cancer Screenings:  Colorectal Screening: Completed 03/27/17. Repeat every 5 years.  Lung Cancer Screening: (Low Dose CT Chest recommended if Age 37-80 years, 30 pack-year currently smoking OR have quit w/in 15years.) does not qualify.   Additional Screening:  Hepatitis C Screening: Up to date  Vision Screening: Recommended annual ophthalmology exams for early detection of glaucoma and other disorders of the eye.  Dental Screening: Recommended annual dental exams for proper oral hygiene  Community Resource Referral:  CRR required this visit?  No        Plan:  I have personally reviewed and addressed the Medicare Annual Wellness questionnaire and have noted the following in the patient's chart:  A. Medical and social history B. Use of alcohol, tobacco or illicit drugs  C. Current medications and supplements D. Functional ability and status E.  Nutritional status F.  Physical activity G. Advance directives H. List of other physicians I.  Hospitalizations, surgeries, and ER visits in previous 12 months J.  [place] such as  hearing and vision if needed, cognitive and depression L. Referrals and appointments   In addition, I have reviewed and discussed with patient certain preventive protocols, quality metrics, and best practice recommendations. A written personalized care plan for preventive services as well as general preventive health recommendations were provided to patient.   Glendora Score, LPN  11/24/3612 Nurse Health Advisor   Nurse Notes: None.

## 2019-02-28 NOTE — Patient Instructions (Addendum)
Ruben [name] , Thank you for taking time to come for your Medicare Wellness Visit. I appreciate your ongoing commitment to your health goals. Please review the following plan we discussed and let me know if I can assist you in the future.   Screening recommendations/referrals: Colonoscopy: Up to date, due 03/2022 Recommended yearly ophthalmology/optometry visit for glaucoma screening and checkup Recommended yearly dental visit for hygiene and checkup  Vaccinations: Influenza vaccine: Up to date Pneumococcal vaccine: Completed series Tdap vaccine: Up to date, due 09/2020 Shingles vaccine: Pt declines today.     Advanced directives: Currently on file.   Conditions/risks identified: Fall risk preventatives discussed with you today. Continue to increase water intake to 6-8 8 oz glasses a day.   Next appointment: 03/01/71   for AWV. Declined scheduling a CPE at this time.   Preventive Care 72 Years and Older, Male Preventive care refers to lifestyle choices and visits with your health care provider that can promote health and wellness. What does preventive care include?  A yearly physical exam. This is also called an annual well check.  Dental exams once or twice a year.  Routine eye exams. Ask your health care provider how often you should have your eyes checked.  Personal lifestyle choices, including:  Daily care of your teeth and gums.  Regular physical activity.  Eating a healthy diet.  Avoiding tobacco and drug use.  Limiting alcohol use.  Practicing safe sex.  Taking low doses of aspirin every day.  Taking vitamin and mineral supplements as recommended by your health care provider. What happens during an annual well check? The services and screenings done by your health care provider during your annual well check will depend on your age, overall health, lifestyle risk factors, and family history of disease. Counseling  Your health care provider may ask you questions  about your:  Alcohol use.  Tobacco use.  Drug use.  Emotional well-being.  Home and relationship well-being.  Sexual activity.  Eating habits.  History of falls.  Memory and ability to understand (cognition).  Work and work Statistician. Screening  You may have the following tests or measurements:  Height, weight, and BMI.  Blood pressure.  Lipid and cholesterol levels. These may be checked every 5 years, or more frequently if you are over 34 years old.  Skin check.  Lung cancer screening. You may have this screening every year starting at age 72 if you have a 30-pack-year history of smoking and currently smoke or have quit within the past 15 years.  Fecal occult blood test (FOBT) of the stool. You may have this test every year starting at age 72.  Flexible sigmoidoscopy or colonoscopy. You may have a sigmoidoscopy every 5 years or a colonoscopy every 10 years starting at age 72.  Prostate cancer screening. Recommendations will vary depending on your family history and other risks.  Hepatitis C blood test.  Hepatitis B blood test.  Sexually transmitted disease (STD) testing.  Diabetes screening. This is done by checking your blood sugar (glucose) after you have not eaten for a while (fasting). You may have this done every 1-3 years.  Abdominal aortic aneurysm (AAA) screening. You may need this if you are a current or former smoker.  Osteoporosis. You may be screened starting at age 72 if you are at high risk. Talk with your health care provider about your test results, treatment options, and if necessary, the need for more tests. Vaccines  Your health care provider may recommend certain vaccines,  such as:  Influenza vaccine. This is recommended every year.  Tetanus, diphtheria, and acellular pertussis (Tdap, Td) vaccine. You may need a Td booster every 10 years.  Zoster vaccine. You may need this after age 72.  Pneumococcal 13-valent conjugate (PCV13)  vaccine. One dose is recommended after age 72.  Pneumococcal polysaccharide (PPSV23) vaccine. One dose is recommended after age 72. Talk to your health care provider about which screenings and vaccines you need and how often you need them. This information is not intended to replace advice given to you by your health care provider. Make sure you discuss any questions you have with your health care provider. Document Released: 10/05/2015 Document Revised: 05/28/2016 Document Reviewed: 07/10/2015 Elsevier Interactive Patient Education  2017 [place] Prevention in the Home Falls can cause injuries. They can happen to people of all ages. There are many things you can do to make your home safe and to help prevent falls. What can I do on the outside of my home?  Regularly fix the edges of walkways and driveways and fix any cracks.  Remove anything that might make you trip as you walk through a door, such as a raised step or threshold.  Trim any bushes or trees on the path to your home.  Use bright outdoor lighting.  Clear any walking paths of anything that might make someone trip, such as rocks or tools.  Regularly check to see if handrails are loose or broken. Make sure that both sides of any steps have handrails.  Any raised decks and porches should have guardrails on the edges.  Have any leaves, snow, or ice cleared regularly.  Use sand or salt on walking paths during winter.  Clean up any spills in your garage right away. This includes oil or grease spills. What can I do in the bathroom?  Use night lights.  Install grab bars by the toilet and in the tub and shower. Do not use towel bars as grab bars.  Use non-skid mats or decals in the tub or shower.  If you need to sit down in the shower, use a plastic, non-slip stool.  Keep the floor dry. Clean up any water that spills on the floor as soon as it happens.  Remove soap buildup in the tub or shower regularly.   Attach bath mats securely with double-sided non-slip rug tape.  Do not have throw rugs and other things on the floor that can make you trip. What can I do in the bedroom?  Use night lights.  Make sure that you have a light by your bed that is easy to reach.  Do not use any sheets or blankets that are too big for your bed. They should not hang down onto the floor.  Have a firm chair that has side arms. You can use this for support while you get dressed.  Do not have throw rugs and other things on the floor that can make you trip. What can I do in the kitchen?  Clean up any spills right away.  Avoid walking on wet floors.  Keep items that you use a lot in easy-to-reach places.  If you need to reach something above you, use a strong step stool that has a grab bar.  Keep electrical cords out of the way.  Do not use floor polish or wax that makes floors slippery. If you must use wax, use non-skid floor wax.  Do not have throw rugs and other things on  the floor that can make you trip. What can I do with my stairs?  Do not leave any items on the stairs.  Make sure that there are handrails on both sides of the stairs and use them. Fix handrails that are broken or loose. Make sure that handrails are as long as the stairways.  Check any carpeting to make sure that it is firmly attached to the stairs. Fix any carpet that is loose or worn.  Avoid having throw rugs at the top or bottom of the stairs. If you do have throw rugs, attach them to the floor with carpet tape.  Make sure that you have a light switch at the top of the stairs and the bottom of the stairs. If you do not have them, ask someone to add them for you. What else can I do to help prevent falls?  Wear shoes that:  Do not have high heels.  Have rubber bottoms.  Are comfortable and fit you well.  Are closed at the toe. Do not wear sandals.  If you use a stepladder:  Make sure that it is fully opened. Do not climb  a closed stepladder.  Make sure that both sides of the stepladder are locked into place.  Ask someone to hold it for you, if possible.  Clearly mark and make sure that you can see:  Any grab bars or handrails.  First and last steps.  Where the edge of each step is.  Use tools that help you move around (mobility aids) if they are needed. These include:  Canes.  Walkers.  Scooters.  Crutches.  Turn on the lights when you go into a dark area. Replace any light bulbs as soon as they burn out.  Set up your furniture so you have a clear path. Avoid moving your furniture around.  If any of your floors are uneven, fix them.  If there are any pets around you, be aware of where they are.  Review your medicines with your doctor. Some medicines can make you feel dizzy. This can increase your chance of falling. Ask your doctor what other things that you can do to help prevent falls. This information is not intended to replace advice given to you by your health care provider. Make sure you discuss any questions you have with your health care provider. Document Released: 07/05/2009 Document Revised: 02/14/2016 Document Reviewed: 10/13/2014 Elsevier Interactive Patient Education  2017 [employer].

## 2019-03-02 ENCOUNTER — Other Ambulatory Visit: Payer: Medicare Other

## 2019-03-03 ENCOUNTER — Other Ambulatory Visit: Payer: Medicare Other

## 2019-03-07 ENCOUNTER — Ambulatory Visit: Admit: 2019-03-07 | Payer: Medicare Other | Admitting: Urology

## 2019-03-07 SURGERY — ENUCLEATION, PROSTATE, USING LASER, WITH MORCELLATION
Anesthesia: Choice

## 2019-03-10 ENCOUNTER — Ambulatory Visit: Payer: Medicare Other

## 2019-03-14 DIAGNOSIS — H43813 Vitreous degeneration, bilateral: Secondary | ICD-10-CM | POA: Diagnosis not present

## 2019-03-23 MED FILL — JAKAFI 25 MG TABLET: 25 | 30 days supply | Qty: 60 | Fill #4

## 2019-03-28 DIAGNOSIS — K5090 Crohn's disease, unspecified, without complications: Secondary | ICD-10-CM | POA: Diagnosis not present

## 2019-04-03 ENCOUNTER — Emergency Department: Payer: Medicare Other

## 2019-04-03 ENCOUNTER — Other Ambulatory Visit: Payer: Self-pay

## 2019-04-03 ENCOUNTER — Encounter: Payer: Self-pay | Admitting: Emergency Medicine

## 2019-04-03 ENCOUNTER — Emergency Department
Admission: EM | Admit: 2019-04-03 | Discharge: 2019-04-03 | Disposition: A | Discharge status: Home / Self Care | Payer: Medicare Other | Attending: Emergency Medicine | Admitting: Emergency Medicine

## 2019-04-03 DIAGNOSIS — Z87891 Personal history of nicotine dependence: Secondary | ICD-10-CM | POA: Insufficient documentation

## 2019-04-03 DIAGNOSIS — Z79899 Other long term (current) drug therapy: Secondary | ICD-10-CM | POA: Diagnosis not present

## 2019-04-03 DIAGNOSIS — K5010 Crohn's disease of large intestine without complications: Secondary | ICD-10-CM | POA: Insufficient documentation

## 2019-04-03 DIAGNOSIS — N3289 Other specified disorders of bladder: Secondary | ICD-10-CM | POA: Diagnosis not present

## 2019-04-03 DIAGNOSIS — R197 Diarrhea, unspecified: Secondary | ICD-10-CM | POA: Diagnosis not present

## 2019-04-03 DIAGNOSIS — R1013 Epigastric pain: Secondary | ICD-10-CM | POA: Diagnosis present

## 2019-04-03 LAB — COMPREHENSIVE METABOLIC PANEL
ALT: 17 U/L (ref 0–44)
AST: 42 U/L — ABNORMAL HIGH (ref 15–41)
Albumin: 4.6 g/dL (ref 3.5–5.0)
Alkaline Phosphatase: 74 U/L (ref 38–126)
Anion gap: 12 (ref 5–15)
BUN: 23 mg/dL (ref 8–23)
CO2: 21 mmol/L — ABNORMAL LOW (ref 22–32)
Calcium: 9.7 mg/dL (ref 8.9–10.3)
Chloride: 99 mmol/L (ref 98–111)
Creatinine, Ser: 1.47 mg/dL — ABNORMAL HIGH (ref 0.61–1.24)
GFR calc Af Amer: 54 mL/min — ABNORMAL LOW (ref >60)
GFR calc non Af Amer: 47 mL/min — ABNORMAL LOW (ref >60)
Glucose, Bld: 107 mg/dL — ABNORMAL HIGH (ref 70–99)
Potassium: 4.2 mmol/L (ref 3.5–5.1)
Sodium: 132 mmol/L — ABNORMAL LOW (ref 135–145)
Total Bilirubin: 1.4 mg/dL — ABNORMAL HIGH (ref 0.3–1.2)
Total Protein: 8.8 g/dL — ABNORMAL HIGH (ref 6.5–8.1)

## 2019-04-03 LAB — CBC
HCT: 39.2 % (ref 39.0–52.0)
Hemoglobin: 12.5 g/dL — ABNORMAL LOW (ref 13.0–17.0)
MCH: 27.3 pg (ref 26.0–34.0)
MCHC: 31.9 g/dL (ref 30.0–36.0)
MCV: 85.6 fL (ref 80.0–100.0)
Platelets: 146 10*3/uL — ABNORMAL LOW (ref 150–400)
RBC: 4.58 MIL/uL (ref 4.22–5.81)
RDW: 20.6 % — ABNORMAL HIGH (ref 11.5–15.5)
WBC: 30.6 10*3/uL — ABNORMAL HIGH (ref 4.0–10.5)
nRBC: 1.3 % — ABNORMAL HIGH (ref 0.0–0.2)

## 2019-04-03 LAB — LIPASE, BLOOD: Lipase: 42 U/L (ref 11–51)

## 2019-04-03 MED ORDER — IOHEXOL 300 MG/ML  SOLN
100.0000 mL | Freq: Once | INTRAMUSCULAR | Status: AC | PRN
  Administered 2019-04-03: 11:00:00 100 mL via INTRAVENOUS

## 2019-04-03 MED ORDER — AMOXICILLIN-POT CLAVULANATE 875-125 MG PO TABS: 1.0000 | ORAL_TABLET | Freq: Two times a day (BID) | ORAL | 0 refills | Status: AC

## 2019-04-03 MED ORDER — SODIUM CHLORIDE 0.9 % IV BOLUS
1000.0000 mL | Freq: Once | INTRAVENOUS | Status: AC
  Administered 2019-04-03: 1000 mL via INTRAVENOUS

## 2019-04-03 MED ORDER — METHYLPREDNISOLONE SODIUM SUCC 125 MG IJ SOLR
125.0000 mg | Freq: Once | INTRAMUSCULAR | Status: AC
  Administered 2019-04-03: 125 mg via INTRAVENOUS
  Filled 2019-04-03: qty 2

## 2019-04-03 MED ORDER — ONDANSETRON 4 MG PO TBDP: 4.0000 mg | ORAL_TABLET | Freq: Three times a day (TID) | ORAL | 0 refills | Status: DC | PRN

## 2019-04-03 MED ORDER — MORPHINE SULFATE (PF) 4 MG/ML IV SOLN
4.0000 mg | Freq: Once | INTRAVENOUS | Status: AC
  Administered 2019-04-03: 4 mg via INTRAVENOUS
  Filled 2019-04-03: qty 1

## 2019-04-03 MED ORDER — PREDNISONE 20 MG PO TABS: 40.0000 mg | ORAL_TABLET | Freq: Every day | ORAL | 0 refills | Status: DC

## 2019-04-03 MED ORDER — ONDANSETRON HCL 4 MG/2ML IJ SOLN
4.0000 mg | Freq: Once | INTRAMUSCULAR | Status: AC
  Administered 2019-04-03: 4 mg via INTRAVENOUS
  Filled 2019-04-03: qty 2

## 2019-04-03 MED ORDER — SODIUM CHLORIDE 0.9% FLUSH
3.0000 mL | Freq: Once | INTRAVENOUS | Status: AC
  Administered 2019-04-03: 3 mL via INTRAVENOUS

## 2019-04-03 NOTE — ED Provider Notes (Signed)
[facility] Emergency Department Provider Note  ____________________________________________   First MD Initiated Contact with Patient 04/03/19 1013     (approximate)  I have reviewed the triage vital signs and the nursing notes.   HISTORY  Chief Complaint Abdominal Pain    HPI Ruben [name] is a 73 y.o. male with history of Crohn's disease here with diffuse abdominal pain.  Patient states his symptoms started approximately 3 days ago as gradual onset of progressive worsening aching, gnawing, epigastric pain.  Said associated nausea and poor appetite.  He has had some loose bowel movements as well.  Denies any blood in his stool or emesis.  Denies any fevers.  He has had some subjective chills, but temp has not been over 100 Fahrenheit.  He states he has a history of similar symptoms due to his Crohn's.  Has not recently changed the medications.  States that last time, he was given prednisone which did improve his symptoms.  No other complaints.  No recent travel.  No cough or shortness of breath.  Pain worse with palpation and eating. No alleviating factors.  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   Renal insufficiency 07/23/2016   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   Right ankle pain 10/31/2015   Hyperuricemia 09/05/2015   BPH (benign prostatic  hyperplasia) 08/23/2015   History of tobacco use 08/23/2015   Glaucoma 08/23/2015   Enlarged prostate without lower urinary tract symptoms (luts) 08/23/2015   Elevated prostate specific antigen (PSA) 08/23/2015   Dermatitis 08/23/2015   Crohn disease ([place]) 08/23/2015   Other prurigo 08/23/2015   Dorsalgia 08/23/2015   Personal history of nicotine dependence 08/23/2015   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   Secondary polycythemia 03/28/2015   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 03/28/2015   Right bundle-branch block 05/03/2012    Past Surgical History:  Procedure Laterality Date   CATARACT EXTRACTION W/PHACO Left 07/13/2018   Procedure: CATARACT EXTRACTION PHACO AND INTRAOCULAR LENS PLACEMENT ([place]);  Surgeon: [name], MD;  Location: [facility] ORS;  Service: Ophthalmology;  Laterality: Left;  [place] 00:39  CDE 5.14 Fluid pack lot # [id number]   CATARACT EXTRACTION W/PHACO Right 08/03/2018   Procedure: CATARACT EXTRACTION PHACO AND INTRAOCULAR LENS PLACEMENT (IOC);  Surgeon: [name], MD;  Location: [facility] ORS;  Service: Ophthalmology;  Laterality: Right;  [place] 00:43.4 CDE 5.23 Fluid pack Lot # [id number]   COLONOSCOPY WITH PROPOFOL N/A 03/27/2017   Procedure: COLONOSCOPY WITH PROPOFOL;  Surgeon: [name], MD;  Location: [facility] ENDOSCOPY;  Service: Endoscopy;  Laterality: N/A;   [place] SURGERY  2008   Prostate Biopsy in [place] to elevated PSA.  per patient normal   Skin Lesion Basal cell removed  wart removal     from eyelid    Prior to Admission medications   Medication Sig Start Date End Date Taking? Authorizing Provider  acetaminophen (TYLENOL) 500 MG tablet Take 500 mg by mouth daily as needed for moderate pain.    [provider]  allopurinol (ZYLOPRIM) 300 MG tablet Take 1 tablet (300 mg total) by mouth daily. 02/21/19   Lequita Asal, MD  Cholecalciferol (VITAMIN D3) 1000 units CAPS Take 1,000 Units  by mouth daily at 12 noon.     [provider]  Cyanocobalamin (RA VITAMIN B-12 TR) 1000 MCG TBCR Take 1,000 mcg by mouth daily at 12 noon.     [provider]  ferrous sulfate 325 (65 FE) MG tablet Take 325 mg by mouth every Monday, Wednesday, and Friday.     [provider]  finasteride (PROSCAR) 5 MG tablet Take 1 tablet (5 mg total) by mouth every evening. 09/13/18   Hollice Espy, MD  folic acid (FOLVITE) 1 MG tablet Take 1 mg by mouth daily at 12 noon.  03/20/15   [provider]  JAKAFI 25 MG tablet TAKE 1 TABLET (25 MG TOTAL) BY MOUTH 2 TIMES DAILY. 11/09/18   Karen Kitchens, NP  latanoprost (XALATAN) 0.005 % ophthalmic solution Place 1 drop into both eyes at bedtime.     [provider]  Multiple Vitamins-Minerals (CENTRUM SILVER ULTRA MENS) TABS Take 1 tablet by mouth daily at 12 noon.     [provider]  Omega-3 Fatty Acids (KP FISH OIL) 1200 MG CAPS Take 1,200 mg by mouth daily.     [provider]  sodium bicarbonate 650 MG tablet Take 650 mg by mouth 2 (two) times daily. 06/06/17   [provider]  timolol (TIMOPTIC) 0.5 % ophthalmic solution Place 1 drop into both eyes daily. In AM 05/27/18   [provider]    Allergies Patient has no known allergies.  Family History  Problem Relation Age of Onset   Cancer Mother        Liver   Cancer Father        Prostate   Cancer Sister        Hodgkins lymphoma   Cancer Paternal Aunt        Breast    Social History Social History   Tobacco Use   Smoking status: Former Smoker    Packs/day: 0.25    Years: 20.00    Pack years: 5.00    Types: Cigarettes    Quit date: 09/28/2003    Years since quitting: 15.5   Smokeless tobacco: Never Used  Substance Use Topics   Alcohol use: Yes    Alcohol/week: 4.0 - 5.0 standard drinks    Types: 4 - 5 Glasses of wine per week   Drug use: No    Review of Systems  Review of Systems  Constitutional:  Positive for chills and fatigue. Negative for fever.  HENT: Negative for sore throat.   Respiratory: Negative for shortness of breath.   Cardiovascular: Negative for chest pain.  Gastrointestinal: Positive for abdominal pain, diarrhea, nausea and vomiting.  Genitourinary: Negative for flank pain.  Musculoskeletal: Negative for neck pain.  Skin: Negative for rash and wound.  Allergic/Immunologic: Negative for immunocompromised state.  Neurological: Positive for weakness. Negative for numbness.  Hematological: Does not bruise/bleed easily.     ____________________________________________  PHYSICAL EXAM:      VITAL SIGNS: ED Triage Vitals  Enc Vitals Group  BP 04/03/19 0950 134/77     Pulse Rate 04/03/19 0950 100     Resp 04/03/19 0950 16     Temp 04/03/19 0950 98.8 F (37.1 C)     Temp Source 04/03/19 0950 Oral     SpO2 04/03/19 0950 100 %     Weight 04/03/19 0951 180 lb (81.6 kg)     Height 04/03/19 0951 6' (1.829 m)     Head Circumference --      Peak Flow --      Pain Score 04/03/19 0950 6     Pain Loc --      Pain Edu? --      Excl. in [place]? --      Physical Exam Vitals signs and nursing note reviewed.  Constitutional:      General: He is not in acute distress.    Appearance: He is well-developed.  HENT:     Head: Normocephalic and atraumatic.  Eyes:     Conjunctiva/sclera: Conjunctivae normal.  Neck:     Musculoskeletal: Neck supple.  Cardiovascular:     Rate and Rhythm: Normal rate and regular rhythm.     Heart sounds: Normal heart sounds. No murmur. No friction rub.  Pulmonary:     Effort: Pulmonary effort is normal. No respiratory distress.     Breath sounds: Normal breath sounds. No wheezing or rales.  Abdominal:     General: There is no distension.     Palpations: Abdomen is soft.     Tenderness: There is abdominal tenderness in the epigastric area and periumbilical area. There is no guarding or rebound.  Skin:    General: Skin is warm.      Capillary Refill: Capillary refill takes less than 2 seconds.  Neurological:     Mental Status: He is alert and oriented to person, place, and time.     Motor: No abnormal muscle tone.       ____________________________________________   LABS (all labs ordered are listed, but only abnormal results are displayed)  Labs Reviewed  COMPREHENSIVE METABOLIC PANEL - Abnormal; Notable for the following components:      Result Value   Sodium 132 (*)    CO2 21 (*)    Glucose, Bld 107 (*)    Creatinine, Ser 1.47 (*)    Total Protein 8.8 (*)    AST 42 (*)    Total Bilirubin 1.4 (*)    GFR calc non Af Amer 47 (*)    GFR calc Af Amer 54 (*)    All other components within normal limits  CBC - Abnormal; Notable for the following components:   WBC 30.6 (*)    Hemoglobin 12.5 (*)    RDW 20.6 (*)    Platelets 146 (*)    nRBC 1.3 (*)    All other components within normal limits  LIPASE, BLOOD  URINALYSIS, COMPLETE (UACMP) WITH MICROSCOPIC    ____________________________________________  EKG:  ________________________________________  RADIOLOGY All imaging, including plain films, CT scans, and ultrasounds, independently reviewed by me, and interpretations confirmed via formal radiology reads.  ED MD interpretation:   CT: Acute Crohn's flair, no complications  Official radiology report(s): Ct Abdomen Pelvis W Contrast  Result Date: 04/03/2019 CLINICAL DATA:  Pt to ED via POV c/o lower abdominal pain since Friday. Pt states that the pain has continued to get worse. Pt states that he vomited x 1 on Friday but none since. Pt denies diarrhea but states that his stools are soft.  Pt denies fever. Hx Crohn's EXAM: CT ABDOMEN AND PELVIS WITH CONTRAST TECHNIQUE: Multidetector CT imaging of the abdomen and pelvis was performed using the standard protocol following bolus administration of intravenous contrast. CONTRAST:  176m OMNIPAQUE IOHEXOL 300 MG/ML  SOLN COMPARISON:  CT, 08/24/2012 FINDINGS:  Lower chest: Clear lung bases.  Heart normal in size. Hepatobiliary: No focal liver abnormality is seen. No gallstones, gallbladder wall thickening, or biliary dilatation. Pancreas: Unremarkable. No pancreatic ductal dilatation or surrounding inflammatory changes. Spleen: Enlarged spleen measuring 16.7 x 7.7 x 15.6 cm, increased in size from the prior CT. No splenic mass or focal lesion. Adrenals/Urinary Tract: No adrenal masses. Kidneys normal in position and orientation and show symmetric enhancement and excretion. There is right greater than left renal cortical thinning. There several small low-density renal lesions, too small to fully characterize, likely cysts. A definitive cyst is noted in the midpole the right kidney measuring 1.4 cm. No other renal masses. No stones. No hydronephrosis. Normal ureters. Bladder is only mildly distended. Wall appears mildly thickened. No bladder mass or stone. Stomach/Bowel: Normal stomach. In the pelvis, there are short segments mild small bowel wall thickening with intervening mildly dilated segments showing air-fluid levels. No other small bowel wall thickening. No mesenteric inflammation. Colon is normal in caliber. No colonic wall thickening or adjacent appendix not defined. No evidence of appendicitis. Inflammation. Vascular/Lymphatic: Prominent gastrohepatic ligament lymph nodes, largest 9 mm in short axis. No other adenopathy. Aortic atherosclerosis. No aneurysm. Reproductive: Prostate is mildly enlarged with somewhat ill-defined margins. It bulges against the bladder base. It measures 4.8 x 4.5 x 4.2 cm. Other: No abdominal wall hernia or abnormality. No abdominopelvic ascites. Musculoskeletal: No fracture or acute finding. No osteoblastic or osteolytic lesions. Moderate disc degenerative changes at L5-S1. IMPRESSION: 1. Mild wall thickening of portions of a loop of small bowel within the pelvis with intervening areas of mildly dilated small bowel. This is consistent  with active Crohn's disease. No evidence of an abscess or fistula. No other evidence of bowel inflammation. 2. Splenomegaly, which has developed since the prior CT. The etiology of this is unclear. 3. Mild bladder wall thickening. There is enlargement of the prostate. Suspect chronic bladder outlet obstruction as the etiology of bladder wall thickening. 4. Aortic atherosclerosis. 5. Right greater than left renal cortical thinning. Electronically Signed   By: DLajean ManesM.D.   On: 04/03/2019 11:48    ____________________________________________  PROCEDURES   Procedure(s) performed (including Critical Care):  Procedures  ____________________________________________  INITIAL IMPRESSION / [place] / [place]/ ED COURSE  As part of my medical decision making, I reviewed the following data within the electronic MEDICAL RECORD NUMBER Notes from prior ED visits and [place] Controlled Substance Database      *Ruben CHOYCEwas evaluated in Emergency Department on 04/03/2019 for the symptoms described in the history of present illness. He was evaluated in the context of the global COVID-19 pandemic, which necessitated consideration that the patient might be at risk for infection with the SARS-CoV-2 virus that causes COVID-19. Institutional protocols and algorithms that pertain to the evaluation of patients at risk for COVID-19 are in a state of rapid change based on information released by regulatory bodies including the CDC and federal and state organizations. These policies and algorithms were followed during the patient's care in the ED.  Some ED evaluations and interventions may be delayed as a result of limited staffing during the pandemic.*      Medical Decision Making:  73 yo M here with diffuse abd pain. Labs show persistent leukocytosis 2/2 known dyscrasia, mild dehydration but no evidence of sepsis. CT scan is c/w acute Crohn's exacerbation w/o complicating features. Pt feels much better  after IVF. He has responded well to steroids in the past per his report. Will start on prednisone, ABX, and refer for f/u. ____________________________________________  FINAL CLINICAL IMPRESSION(S) / ED DIAGNOSES  Final diagnoses:  None     MEDICATIONS GIVEN DURING THIS VISIT:  Medications  methylPREDNISolone sodium succinate (SOLU-MEDROL) 125 mg/2 mL injection 125 mg (has no administration in time range)  sodium chloride flush (NS) 0.9 % injection 3 mL (3 mLs Intravenous Given 04/03/19 1118)  sodium chloride 0.9 % bolus 1,000 mL (1,000 mLs Intravenous New Bag/Given 04/03/19 1049)  ondansetron (ZOFRAN) injection 4 mg (4 mg Intravenous Given 04/03/19 1049)  morphine 4 MG/ML injection 4 mg (4 mg Intravenous Given 04/03/19 1052)  iohexol (OMNIPAQUE) 300 MG/ML solution 100 mL (100 mLs Intravenous Contrast Given 04/03/19 1127)     ED Discharge Orders    None       Note:  This document was prepared using Dragon voice recognition software and may include unintentional dictation errors.   Duffy Bruce, MD 04/03/19 1248

## 2019-04-03 NOTE — ED Triage Notes (Signed)
Pt to ED via POV c/o lower abdominal pain since Friday. Pt states that the pain has continued to get worse. Pt states that he vomited x 1 on Friday but none since. Pt denies diarrhea but states that his stools are soft. Pt denies fever.

## 2019-04-04 DIAGNOSIS — R1084 Generalized abdominal pain: Secondary | ICD-10-CM | POA: Diagnosis not present

## 2019-04-04 DIAGNOSIS — R197 Diarrhea, unspecified: Secondary | ICD-10-CM | POA: Diagnosis not present

## 2019-04-04 NOTE — Progress Notes (Signed)
[facility]  [address] [place], [place] [address] Phone: [phone]  Fax: [phone]   Clinic Day:  04/06/2019  Referring physician: [name], MD  Chief Complaint: Ruben [name] is a 73 y.o. 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  HPI: The patient was last seen in the hematology clinic on 01/05/2019. At that time, he was doing well.  He had decided to postpone his prostate surgery.  Hemoglobin was 11.0.  WBC 22,900 with 1% blasts.  Platelet count 148,000.  He continued Jakafi.  He was seen via telemedicine at [facility] hematology on 01/10/2019 by Eli Phillips, NP. He continued on ruxolitinib 17m BID and oral iron 3 times/week.  He will be eligible for Shingrix vaccine after 5 years (he previously received Zostavax).  He was approved for Jakafi at $0 copay on 01/24/2019.  His prostate surgery was rescheduled for 03/07/2019, but the patient canceled it again due to COVID-19.   He was seen in gastroenterology by MTammi Klippel PBlancoon 03/28/2019 for annual follow-up of his Crohn's disease.  He was asymptomatic.  Plan was ongoing surveillance.   [facility].  Abdomen and pelvis CT revealed mild wall thickening of portions of a loop of small bowel within the pelvis with intervening areas of mildly dilated small bowel, consistent with active Crohn's disease. No evidence of an abscess or fistula. No other evidence of bowel inflammation. There was splenomegaly (16.7 x 7.7 x 15.6 cm), developed since 08/24/2012. There was mild bladder wall thickening and enlargement of the prostate. Chronic bladder outlet obstruction was felt to be the etiology of bladder wall thickening. He was started on prednisone 476m Augmentin, and referred for follow-up.   Labs on 04/03/2019: WBC 30,600, hemoglobin 12.5, hematocrit 39.2, platelets 146,000. Sodium 132, creatinine 1.47, AST 42,  ALT 17, bilirubin 1.4.   [facility] for abdominal pain, emesis, and diarrhea.  There was concern for possible hematemesis with vomiting in his home.  Rectal exam was negative.  Sodium was 123.  He declined hospitalization.  He received a liter on NS.  During the interim, his is feeling "achey and wheezy."  He is fatigued.  He has had sweats and chills, and a fever of 99 two days ago. He denies any headaches, visual changes, chest pain, or shortness of breath.  He notes dry mouth and a cough. He denies any urinary issues; he continues I/O catheterization. He denies any recent falls.   His wife reports small red spots that look like insect bites but are not and appear from time to time on his extremities.   He has lost 20 lbs since his last clinic visit in 09/2018.  His wife reports he has not been eating much, just about 2 yogurt cups and 1/2 a peanut butter sandwich in the past 2 days.  He has been drinking plain water. He denies any vomiting since yesterday morning.  He has taken ondansetron. He reports he has not had a bowel movement in 2 days.   He notes pain in his back, 4 out of 10, which started around the time of his Crohn's flare up with abdominal pain. He has been using Biofreeze, which has improved the pain. His last Crohn's flare up was in 2003. He has slept only 2 hours per night due to the pain.  Pain is episodic.  He  is having mild memory issues.   He is agreeable to be hospitalized.    Past Medical History:  Diagnosis Date   Blood dyscrasia    BPH (benign prostatic hyperplasia)    Cancer (HCC)    SKIN/ POLYCYTHEMIA VERA   Chronic kidney disease    RENAL INSUFF (40%)   Crohn disease (HCC)    Crohn's disease ([place])    Glaucoma    Glaucoma    Gout    History of chicken pox    Myelofibrosis ([place])    Nosebleed    RBBB    Right bundle branch block     Past Surgical History:  Procedure Laterality Date   CATARACT EXTRACTION  W/PHACO Left 07/13/2018   Procedure: CATARACT EXTRACTION PHACO AND INTRAOCULAR LENS PLACEMENT ([place]);  Surgeon: [name], MD;  Location: [facility] ORS;  Service: Ophthalmology;  Laterality: Left;  [place] 00:39  CDE 5.14 Fluid pack lot # [id number]   CATARACT EXTRACTION W/PHACO Right 08/03/2018   Procedure: CATARACT EXTRACTION PHACO AND INTRAOCULAR LENS PLACEMENT ([facility]);  Surgeon: [name], MD;  Location: [facility] ORS;  Service: Ophthalmology;  Laterality: Right;  [place] 00:43.4 CDE 5.23 Fluid pack Lot # [id number]   COLONOSCOPY WITH PROPOFOL N/A 03/27/2017   Procedure: COLONOSCOPY WITH PROPOFOL;  Surgeon: [name], MD;  Location: [facility] ENDOSCOPY;  Service: Endoscopy;  Laterality: N/A;   [place] SURGERY  2008   Prostate Biopsy in [place] to elevated PSA.  per patient normal   Skin Lesion Basal cell removed     wart removal     from eyelid    Family History  Problem Relation Age of Onset   Cancer Mother        Liver   Cancer Father        Prostate   Cancer Sister        Hodgkins lymphoma   Cancer Paternal Aunt        Breast    Social History:  reports that he quit smoking about 15 years ago. His smoking use included cigarettes. He has a 5.00 pack-year smoking history. He has never used smokeless tobacco. He reports current alcohol use of about 4.0 - 5.0 standard drinks of alcohol per week. He reports that he does not use drugs. He previously worked for [employer].  He no longer volunteers for [employer].  The patient's wife is [name]. He is alone today, with his wife over the phone.   Allergies: No Known Allergies  Current Medications: Current Outpatient Medications  Medication Sig Dispense Refill   acetaminophen (TYLENOL) 500 MG tablet Take 500 mg by mouth daily as needed for moderate pain.     allopurinol (ZYLOPRIM) 300 MG tablet Take 1 tablet (300 mg total) by mouth daily. 90 tablet 0   amoxicillin-clavulanate (AUGMENTIN)  875-125 MG tablet Take 1 tablet by mouth 2 (two) times daily for 7 days. 14 tablet 0   Cholecalciferol (VITAMIN D3) 1000 units CAPS Take 1,000 Units by mouth daily at 12 noon.      Cyanocobalamin (RA VITAMIN B-12 TR) 1000 MCG TBCR Take 1,000 mcg by mouth daily at 12 noon.      ferrous sulfate 325 (65 FE) MG tablet Take 325 mg by mouth every Monday, Wednesday, and Friday.      finasteride (PROSCAR) 5 MG tablet Take 1 tablet (5 mg total) by mouth every evening. 90 tablet 3   folic acid (FOLVITE) 1 MG tablet  Take 1 mg by mouth daily at 12 noon.      JAKAFI 25 MG tablet TAKE 1 TABLET (25 MG TOTAL) BY MOUTH 2 TIMES DAILY. 60 tablet 5   latanoprost (XALATAN) 0.005 % ophthalmic solution Place 1 drop into both eyes at bedtime.      Multiple Vitamins-Minerals (CENTRUM SILVER ULTRA MENS) TABS Take 1 tablet by mouth daily at 12 noon.      ondansetron (ZOFRAN ODT) 4 MG disintegrating tablet Take 1 tablet (4 mg total) by mouth every 8 (eight) hours as needed for nausea or vomiting. 12 tablet 0   predniSONE (DELTASONE) 20 MG tablet Take 2 tablets (40 mg total) by mouth daily for 7 days. 14 tablet 0   sodium bicarbonate 650 MG tablet Take 650 mg by mouth 2 (two) times daily.     timolol (TIMOPTIC) 0.5 % ophthalmic solution Place 1 drop into both eyes daily. In AM  5   Omega-3 Fatty Acids (KP FISH OIL) 1200 MG CAPS Take 1,200 mg by mouth daily.      No current facility-administered medications for this visit.     Review of Systems  Constitutional: Positive for chills (feels "achey"), diaphoresis, malaise/fatigue and weight loss (20lbs since 09/2018). Negative for fever (resolved).       Fatigue.  Feels "achey and wheezy."  HENT: Negative.  Negative for congestion, hearing loss, nosebleeds, sinus pain and sore throat.   Eyes: Negative for blurred vision, double vision, photophobia, pain and discharge.       S/p cataract surgery.  Uses reading glasses.  Respiratory: Negative.  Negative for cough,  hemoptysis, sputum production and shortness of breath.   Cardiovascular: Negative.  Negative for chest pain, palpitations, orthopnea, leg swelling and PND.  Gastrointestinal: Positive for abdominal pain (Crohn's flare up) and vomiting (1 episode of black emesis). Negative for blood in stool, diarrhea (recent, now resolved), heartburn, melena and nausea. Constipation: no bowel in 2 days.  Genitourinary: Negative for dysuria, frequency, hematuria and urgency.       Urinary catheterization (self) 4x/day.  Postponed prostate surgery.  Musculoskeletal: Positive for back pain (4/10). Negative for falls, joint pain, myalgias and neck pain.  Skin: Negative.  Negative for itching and rash.  Neurological: Positive for weakness (generalized). Negative for dizziness, tingling, tremors, sensory change, speech change, focal weakness and headaches.  Endo/Heme/Allergies: Negative.  Does not bruise/bleed easily.  Psychiatric/Behavioral: Positive for memory loss (mild). Negative for depression and substance abuse. The patient has insomnia (difficulty sleeping). The patient is not nervous/anxious.   All other systems reviewed and are negative.  Performance status (ECOG): 2  Vitals Blood pressure (!) 155/91, pulse 74, temperature (!) 97.3 F (36.3 C), temperature source Tympanic, resp. rate 16, weight 169 lb 8.5 oz (76.9 kg), SpO2 100 %.   Physical Exam  Constitutional: He is oriented to person, place, and time.  Fatigued appearing sitting in the exam room in no acute distress.  HENT:  Head: Normocephalic and atraumatic.  Mouth/Throat: No oropharyngeal exudate.  Short gray hair.  Mask.  Eyes: Pupils are equal, round, and reactive to light. Conjunctivae and EOM are normal. No scleral icterus.  Glasses.  Blue eyes.  Neck: Normal range of motion. Neck supple. No JVD present.  Cardiovascular: Normal rate, regular rhythm and normal heart sounds. Exam reveals no gallop.  No murmur heard. Pulmonary/Chest: Effort  normal and breath sounds normal. No respiratory distress. He has no wheezes. He has no rales.  Abdominal: Soft. Bowel sounds are normal. He  exhibits no distension and no mass. There is splenomegaly (very mild). There is no abdominal tenderness. There is no rebound and no guarding.  Musculoskeletal: Normal range of motion.        General: No edema.  Lymphadenopathy:    He has no cervical adenopathy.    He has no axillary adenopathy.       Right: No supraclavicular adenopathy present.       Left: No supraclavicular adenopathy present.  Neurological: He is alert and oriented to person, place, and time.  Skin: Skin is warm and dry. No rash noted. He is not diaphoretic. No erythema. No pallor.  Psychiatric: His behavior is normal. Judgment and thought content normal.  Nursing note and vitals reviewed.   Appointment on 04/06/2019  Component Date Value Ref Range Status   Uric Acid, Serum 04/06/2019 3.9  3.7 - 8.6 mg/dL Final   Performed at [facility], [address] [place], [place] [address]   LDH 04/06/2019 1,082* 98 - 192 U/L Final   Performed at [facility], [address], [place], [place] [address]   Sodium 04/06/2019 120* 135 - 145 mmol/L Final   Potassium 04/06/2019 3.5  3.5 - 5.1 mmol/L Final   Chloride 04/06/2019 90* 98 - 111 mmol/L Final   CO2 04/06/2019 20* 22 - 32 mmol/L Final   Glucose, Bld 04/06/2019 150* 70 - 99 mg/dL Final   BUN 04/06/2019 33* 8 - 23 mg/dL Final   Creatinine, Ser 04/06/2019 1.27* 0.61 - 1.24 mg/dL Final   Calcium 04/06/2019 8.8* 8.9 - 10.3 mg/dL Final   Total Protein 04/06/2019 8.5* 6.5 - 8.1 g/dL Final   Albumin 04/06/2019 4.4  3.5 - 5.0 g/dL Final   AST 04/06/2019 49* 15 - 41 U/L Final   ALT 04/06/2019 21  0 - 44 U/L Final   Alkaline Phosphatase 04/06/2019 79  38 - 126 U/L Final   Total Bilirubin 04/06/2019 1.0  0.3 - 1.2 mg/dL Final   GFR calc non Af Amer 04/06/2019 56* >60 mL/min Final   GFR calc Af  Amer 04/06/2019 >60  >60 mL/min Final   Anion gap 04/06/2019 10  5 - 15 Final   Performed at [facility], [address] [place], [place] [address]   WBC 04/06/2019 45.1* 4.0 - 10.5 K/uL Final   RBC 04/06/2019 4.40  4.22 - 5.81 MIL/uL Final   Hemoglobin 04/06/2019 12.3* 13.0 - 17.0 g/dL Final   HCT 04/06/2019 36.5* 39.0 - 52.0 % Final   MCV 04/06/2019 83.0  80.0 - 100.0 fL Final   MCH 04/06/2019 28.0  26.0 - 34.0 pg Final   MCHC 04/06/2019 33.7  30.0 - 36.0 g/dL Final   RDW 04/06/2019 20.4* 11.5 - 15.5 % Final   Platelets 04/06/2019 166  150 - 400 K/uL Final   nRBC 04/06/2019 0.6* 0.0 - 0.2 % Final   Performed at [facility], [address], [place], [place] [address]   Neutrophils Relative % 04/06/2019 PENDING  % Incomplete   Neutro Abs 04/06/2019 PENDING  1.7 - 7.7 K/uL Incomplete   Band Neutrophils 04/06/2019 PENDING  % Incomplete   Lymphocytes Relative 04/06/2019 PENDING  % Incomplete   Lymphs Abs 04/06/2019 PENDING  0.7 - 4.0 K/uL Incomplete   Monocytes Relative 04/06/2019 PENDING  % Incomplete   Monocytes Absolute 04/06/2019 PENDING  0.1 - 1.0 K/uL Incomplete   Eosinophils Relative 04/06/2019 PENDING  % Incomplete   Eosinophils Absolute 04/06/2019 PENDING  0.0 - 0.5 K/uL Incomplete   Basophils Relative 04/06/2019 PENDING  % Incomplete   Basophils Absolute 04/06/2019 PENDING  0.0 - 0.1 K/uL Incomplete   WBC Morphology 04/06/2019 PENDING   Incomplete   RBC Morphology 04/06/2019 PENDING   Incomplete   Smear Review 04/06/2019 PENDING   Incomplete   Other 04/06/2019 PENDING  % Incomplete   nRBC 04/06/2019 PENDING  0 /100 WBC Incomplete   Metamyelocytes Relative 04/06/2019 PENDING  % Incomplete   Myelocytes 04/06/2019 PENDING  % Incomplete   Promyelocytes Relative 04/06/2019 PENDING  % Incomplete   Blasts 04/06/2019 PENDING  % Incomplete  Admission on 04/05/2019, Discharged on 04/05/2019  Component Date Value Ref Range  Status   Sodium 04/05/2019 123* 135 - 145 mmol/L Final   Potassium 04/05/2019 4.0  3.5 - 5.1 mmol/L Final   Chloride 04/05/2019 89* 98 - 111 mmol/L Final   CO2 04/05/2019 19* 22 - 32 mmol/L Final   Glucose, Bld 04/05/2019 112* 70 - 99 mg/dL Final   BUN 04/05/2019 37* 8 - 23 mg/dL Final   Creatinine, Ser 04/05/2019 1.30* 0.61 - 1.24 mg/dL Final   Calcium 04/05/2019 8.9  8.9 - 10.3 mg/dL Final   Total Protein 04/05/2019 8.6* 6.5 - 8.1 g/dL Final   Albumin 04/05/2019 4.5  3.5 - 5.0 g/dL Final   AST 04/05/2019 51* 15 - 41 U/L Final   ALT 04/05/2019 22  0 - 44 U/L Final   Alkaline Phosphatase 04/05/2019 79  38 - 126 U/L Final   Total Bilirubin 04/05/2019 1.2  0.3 - 1.2 mg/dL Final   GFR calc non Af Amer 04/05/2019 55* >60 mL/min Final   GFR calc Af Amer 04/05/2019 >60  >60 mL/min Final   Anion gap 04/05/2019 15  5 - 15 Final   Performed at [facility], [address] [place], [place] [address]   WBC 04/05/2019 48.7* 4.0 - 10.5 K/uL Final   RBC 04/05/2019 4.53  4.22 - 5.81 MIL/uL Final   Hemoglobin 04/05/2019 12.5* 13.0 - 17.0 g/dL Final   HCT 04/05/2019 37.5* 39.0 - 52.0 % Final   MCV 04/05/2019 82.8  80.0 - 100.0 fL Final   MCH 04/05/2019 27.6  26.0 - 34.0 pg Final   MCHC 04/05/2019 33.3  30.0 - 36.0 g/dL Final   RDW 04/05/2019 20.3* 11.5 - 15.5 % Final   Platelets 04/05/2019 169  150 - 400 K/uL Final   nRBC 04/05/2019 0.5* 0.0 - 0.2 % Final   Performed at [facility], [place],  [address]   Color, Urine 04/05/2019 YELLOW* YELLOW Final   APPearance 04/05/2019 CLEAR* CLEAR Final   Specific Gravity, Urine 04/05/2019 1.018  1.005 - 1.030 Final   pH 04/05/2019 6.0  5.0 - 8.0 Final   Glucose, UA 04/05/2019 NEGATIVE  NEGATIVE mg/dL Final   Hgb urine dipstick 04/05/2019 SMALL* NEGATIVE Final   Bilirubin Urine 04/05/2019 NEGATIVE  NEGATIVE Final   Ketones, ur 04/05/2019 NEGATIVE  NEGATIVE mg/dL Final   Protein,  ur 04/05/2019 100* NEGATIVE mg/dL Final   Nitrite 04/05/2019 NEGATIVE  NEGATIVE Final   Leukocytes,Ua 04/05/2019 NEGATIVE  NEGATIVE Final   RBC / HPF 04/05/2019 0-5  0 - 5 RBC/hpf Final   WBC, UA 04/05/2019 0-5  0 - 5 WBC/hpf Final   Bacteria, UA 04/05/2019 NONE SEEN  NONE SEEN Final   Squamous Epithelial / LPF 04/05/2019 NONE SEEN  0 - 5 Final   Performed at [facility], [address] [place], [place] [address]   Lipase 04/05/2019 38  11 - 51 U/L Final   Performed at [facility], [address] [place], [place] [address]    Assessment:  ROSWELL NDIAYE is a 73 y.o. 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  He has had polycythemia dating back to 2013.  Hematocrit was 62.1 with a hemoglobin of 19.8 on 03/28/2015.  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  Erythropoietin level was 1.1 (low).  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  He began a phlebotomy program on 03/28/2015 to maintain a hematocrit goal of < 45.  Last phlebotomy was on 05/02/2015.  He is on a baby aspirin.  CBC on 11/28/2015 revealed a hematocrit of 39.3, hemoglobin 11.9, platelets 463,000, WBC 32,000 with an ANC of 25,000.  Differential included 71% segs, 4% lymphocytes and 17% monocytes.  Peripheral smear revealed leukocytosis with predominantly mature neutrophils, increased monocytes and rare blasts (<1%).  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  Marrow was packed (95-100% cellularity) with pan myelosis, multi-lineage dyspoiesis, and no significant increase in blasts.  There was moderate to focally marked reticulin fibrosis (grade 2-3/3).  Storage iron was not identified.  Flow cytometry revealed non-specific atypical myeloid findings with no increase in blasts.  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  Cytogenetics were normal (46, XY).  Bone marrow on 04/07/2016 at [facility] revealed a hypercellular marrow (> 95%) with persistent involvement by myeloid neoplasm with 5% blasts.  There was mild reticulin fibrosis.  FISH t(9;22) results were normal.  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  Cytogenetics are pending.  Bone marrow on 08/09/2018 revealed a hypercellular bone marrow (>95%) with persistent involvement by myeloid neoplasm with <1% blasts by manual touch preparation differential.  There was marked reticulin fibrosis with focal collagen deposition.  Cytogenetics were normal (46, XY).  -  Myeloid mutation panel study is pending.  He was briefly on hydroxyurea 500 mg a day (12/05/2015 - 12/19/2015 and 06/25/2016 - 07/03/2016).   Platelet count increased to 1.18 million and white count to 54,800 without increased blasts on 06/25/2016.  CBC on 07/02/2016 revealed a platelets of 429,000 with a white count 30,600.  He began allopurinol on 12/19/2015.  He began Jakafi 20 mg BID on 05/28/2016.  He has been on Jakafi 25 mg BID since 05/26/2017.  LDH is followed:  466 on 01/02/2016, 596 on 05/23/2016, 663 on 07/30/2016, 522 on 09/24/2016, 638 on 01/05/2017, 625 on 02/25/2017, 1144 on 06/10/2017, 720 on 06/17/2017, 728 on 06/24/2017, and 724 on 01/06/2018.  Triglycerides were 183 (< 150) on 08/20/2016, 180 on 01/05/2017, 312 on 06/17/2017, 165 on 11/25/2017, 113 on 02/17/2018.  He has urinary retention.  Renal ultrasound on 07/28/2016 revealed bilateral hydronephrosis (right > left) and a large postvoid residual (2 liters) suggesting bladder dysfunction or outlet obstruction.  He underwent temporary Foley catheter placement.  He performs I/O self catheterizations.  He is on Flomax and finasteride.  PSA was 2.02 on 07/09/2016.  He is followed by GI (Dr. Vira Agar) for a history of polyps and Crohn's disease.  He had an unremarkable  colonoscopy in 03/2017.  Abdomen and pelvis CT on 04/03/2019 revealed mild wall thickening of portions of a loop of small bowel within the pelvis with intervening areas of mildly dilated small bowel, consistent with active Crohn's disease.  No evidence of an abscess or fistula. No other evidence of bowel inflammation. There was splenomegaly (16.7 x 7.7 x 15.6 cm), developed since 08/24/2012. There was mild bladder wall thickening and enlargement of the prostate. Chronic bladder outlet obstruction was felt to be the etiology of bladder wall thickening.  He has chronic renal insufficiency (Cr 1.85; CrCl 36 ml/min).  SPEP on 09/23/2016 was negative.  Spot urine revealed 17.4% of 27.3 mg/dL.of a monoclonal protein.  24 hour urine on 10/16/2016 revealed no monoclonal protein.  Renal function transiently decreased in 06/2017 after Bactrim.  He is followed by nephrology and urology.  He has a history of epistaxis.  Normal studies included:  PT, PTT, von Willebrand panel, and platelet function assay (PFA).  Symptomatically, he has an active Crohn's flare associated with nausea, vomiting, diarrhea, and abdominal pain.  He had black emesis x 1.  He is eating and drinking little (only water).  Diarrhea appears to have improved.  Pain is episodic.  He has had sweats and chills.  He is on prednisone and Augmentin.  He has had no further fever on steroids.  He has lost 20 pounds since last visit (3 months).  Abdominal exam is benign.  WBC is 45,100 (ANC 28,000).  Sodium is 120.  Plan: 1.   Labs today: CBC w/ diff, CMP, LDH, uric acid, ferritin.  2.   Polycythemia rubra vera  WBC 45,100 (stable).  LDH 1,082 (up from 702 on 01/04/2019).  Peripheral smear for path review.  Suspect elevated WBC secondary to underlying PV, steroids, and Crohn's flare.  Continue Shanon Brow (patient's wife to bring to hospital). 3.   Iron deficiency  Patient on oral iron 3 days/week (Mondays, Wednesdays and Fridays).  Hemoglobin 12.3.   Ferritin 163.  Ferritin may be falsely elevated secondary to Crohn's flare.  Given underlying PV, will hold iron today (not increase as may cause elevated hemoglobin). 4.   Crohn's disease  Patient followed by GI [facility].  Anticipate GI consult while hospitalized- spoke with Tammi Klippel, PA.     Dr Alice Reichert consulting.  Etiology of black emesis unclear.  Patient currently on steroids (which can suppress fever). 5.   Dehydration and hyponatremia  Sodium 135 to 132 to 123 to 120.  Patient has been drinking small amounts of water only.  IVF NS in clinic. 6.   Right sided back pain  Etiology unclear.  Anticipate evaluation in hospital. 7.   Admit to [facility].  Addendum:  Patient received 1 liter NS in clinic.  Patient feeling better.  He has voided once.  I discussed the assessment and treatment plan with the patient.  The patient was provided an opportunity to ask questions and all were answered.  The patient agreed with the plan and demonstrated an understanding of the instructions.  The patient was advised to call back if the symptoms worsen or if the condition fails to improve as anticipated.  I provided 35 minutes of face-to-face time during this this encounter and > 50% was spent counseling as documented under my assessment and plan.    Lequita Asal, MD, PhD    04/06/2019, 9:30 AM  I, Molly Dorshimer, am acting as Education administrator for [employer]. Mike Gip, MD, PhD.  I, Chamari Cutbirth C. Mike Gip, MD, have reviewed the above documentation for accuracy and completeness, and I agree with the above.

## 2019-04-05 ENCOUNTER — Encounter: Payer: Self-pay | Admitting: Emergency Medicine

## 2019-04-05 ENCOUNTER — Emergency Department
Admission: EM | Admit: 2019-04-05 | Discharge: 2019-04-05 | Disposition: A | Discharge status: Home / Self Care | Payer: Medicare Other | Attending: Emergency Medicine | Admitting: Emergency Medicine

## 2019-04-05 ENCOUNTER — Other Ambulatory Visit: Payer: Self-pay

## 2019-04-05 DIAGNOSIS — Z79899 Other long term (current) drug therapy: Secondary | ICD-10-CM | POA: Diagnosis not present

## 2019-04-05 DIAGNOSIS — K5010 Crohn's disease of large intestine without complications: Secondary | ICD-10-CM

## 2019-04-05 DIAGNOSIS — E871 Hypo-osmolality and hyponatremia: Secondary | ICD-10-CM

## 2019-04-05 DIAGNOSIS — R1084 Generalized abdominal pain: Secondary | ICD-10-CM | POA: Insufficient documentation

## 2019-04-05 DIAGNOSIS — D72829 Elevated white blood cell count, unspecified: Secondary | ICD-10-CM

## 2019-04-05 DIAGNOSIS — Z85828 Personal history of other malignant neoplasm of skin: Secondary | ICD-10-CM | POA: Insufficient documentation

## 2019-04-05 DIAGNOSIS — R109 Unspecified abdominal pain: Secondary | ICD-10-CM | POA: Diagnosis present

## 2019-04-05 DIAGNOSIS — E860 Dehydration: Secondary | ICD-10-CM | POA: Diagnosis not present

## 2019-04-05 DIAGNOSIS — Z87891 Personal history of nicotine dependence: Secondary | ICD-10-CM | POA: Diagnosis not present

## 2019-04-05 HISTORY — DX: Myelofibrosis: D75.81

## 2019-04-05 LAB — COMPREHENSIVE METABOLIC PANEL
ALT: 22 U/L (ref 0–44)
AST: 51 U/L — ABNORMAL HIGH (ref 15–41)
Albumin: 4.5 g/dL (ref 3.5–5.0)
Alkaline Phosphatase: 79 U/L (ref 38–126)
Anion gap: 15 (ref 5–15)
BUN: 37 mg/dL — ABNORMAL HIGH (ref 8–23)
CO2: 19 mmol/L — ABNORMAL LOW (ref 22–32)
Calcium: 8.9 mg/dL (ref 8.9–10.3)
Chloride: 89 mmol/L — ABNORMAL LOW (ref 98–111)
Creatinine, Ser: 1.3 mg/dL — ABNORMAL HIGH (ref 0.61–1.24)
GFR calc Af Amer: >60 mL/min (ref >60)
GFR calc non Af Amer: 55 mL/min — ABNORMAL LOW (ref >60)
Glucose, Bld: 112 mg/dL — ABNORMAL HIGH (ref 70–99)
Potassium: 4 mmol/L (ref 3.5–5.1)
Sodium: 123 mmol/L — ABNORMAL LOW (ref 135–145)
Total Bilirubin: 1.2 mg/dL (ref 0.3–1.2)
Total Protein: 8.6 g/dL — ABNORMAL HIGH (ref 6.5–8.1)

## 2019-04-05 LAB — URINALYSIS, COMPLETE (UACMP) WITH MICROSCOPIC
Bacteria, UA: NONE SEEN
Bilirubin Urine: NEGATIVE
Glucose, UA: NEGATIVE mg/dL
Ketones, ur: NEGATIVE mg/dL
Leukocytes,Ua: NEGATIVE
Nitrite: NEGATIVE
Protein, ur: 100 mg/dL — AB
Specific Gravity, Urine: 1.018 (ref 1.005–1.030)
Squamous Epithelial / HPF: NONE SEEN (ref 0–5)
pH: 6 (ref 5.0–8.0)

## 2019-04-05 LAB — CBC
HCT: 37.5 % — ABNORMAL LOW (ref 39.0–52.0)
Hemoglobin: 12.5 g/dL — ABNORMAL LOW (ref 13.0–17.0)
MCH: 27.6 pg (ref 26.0–34.0)
MCHC: 33.3 g/dL (ref 30.0–36.0)
MCV: 82.8 fL (ref 80.0–100.0)
Platelets: 169 10*3/uL (ref 150–400)
RBC: 4.53 MIL/uL (ref 4.22–5.81)
RDW: 20.3 % — ABNORMAL HIGH (ref 11.5–15.5)
WBC: 48.7 10*3/uL — ABNORMAL HIGH (ref 4.0–10.5)
nRBC: 0.5 % — ABNORMAL HIGH (ref 0.0–0.2)

## 2019-04-05 LAB — LIPASE, BLOOD: Lipase: 38 U/L (ref 11–51)

## 2019-04-05 MED ORDER — SODIUM CHLORIDE 0.9 % IV BOLUS: 1000.0000 mL | Freq: Once | INTRAVENOUS | Status: DC

## 2019-04-05 MED ORDER — SODIUM CHLORIDE 0.9 % IV SOLN
2.0000 g | Freq: Once | INTRAVENOUS | Status: DC
  Filled 2019-04-05: qty 20

## 2019-04-05 MED ORDER — METRONIDAZOLE IN NACL 5-0.79 MG/ML-% IV SOLN
500.0000 mg | Freq: Once | INTRAVENOUS | Status: DC
  Filled 2019-04-05: qty 100

## 2019-04-05 NOTE — ED Triage Notes (Signed)
Patient to ER for already diagnosed Crohn's flare. Patient was seen in ER on 7/12, but has since vomited black colored emesis. Patient states he has not been on immunosuppressant drugs in 10 years, but does occasionally require Prednisone for flares. Patient also reports he is returning to ER d/t high level of pain.

## 2019-04-05 NOTE — ED Provider Notes (Signed)
[facility] Emergency Department Provider Note  ____________________________________________  Time seen: Approximately 6:14 PM  I have reviewed the triage vital signs and the nursing notes.   HISTORY  Chief Complaint Abdominal Pain, Emesis, and Diarrhea    HPI Ruben [name] is a 73 y.o. male with a history of chronic leukocytosis, Crohn's disease, myelofibrosis, CKD who was sent to the ED today by primary care due to possible hematemesis in clinic.  Patient reports that he has been having abdominal pain consistent with a Crohn's flare for the past 4 days.  He came to the ED 2 days ago, had labs and a CT scan which confirmed Crohn's flare.  He was started on Augmentin and prednisone.  He reports that he has been taking the medicine and after yesterday was feeling pretty bad but today feels much better.  He is eating carefully and drinking water to stay hydrated.  He also notes that he drank a cup of coffee this morning with breakfast.  He then went to primary care clinic where they asked him to do a stool test.  He went to the bathroom to strain, was unable to produce a specimen, but then instead had an episode of vomiting.  The patient denies any black or bloody stool.  Denies any upper abdominal pain.  He also denies any weakness or change in balance or coordination, headache, vision disturbance.     Past Medical History:  Diagnosis Date  . Blood dyscrasia   . BPH (benign prostatic hyperplasia)   . Cancer ([place])    SKIN/ POLYCYTHEMIA VERA  . Chronic kidney disease    RENAL INSUFF (40%)  . Crohn disease ([place])   . Crohn's disease ([place])   . Glaucoma   . Glaucoma   . Gout   . History of chicken pox   . Myelofibrosis ([place])   . Nosebleed   . RBBB   . Right bundle branch block      Patient Active Problem List   Diagnosis Date Noted  . Iron deficiency anemia 11/26/2018  . Goals of care, counseling/discussion 01/06/2018  . 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 High triglycerides 10/14/2017  . 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 Renal insufficiency 07/23/2016  . 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 Right ankle pain 10/31/2015  . Hyperuricemia 09/05/2015  . BPH (benign prostatic hyperplasia) 08/23/2015  . History of tobacco use 08/23/2015  . Glaucoma 08/23/2015  . Enlarged prostate without lower urinary tract symptoms (luts) 08/23/2015  . Elevated prostate specific antigen (PSA) 08/23/2015  . Dermatitis 08/23/2015  . Crohn disease ([place]) 08/23/2015  . Other prurigo 08/23/2015  . Dorsalgia 08/23/2015  . Personal history of nicotine dependence 08/23/2015  . 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 Secondary polycythemia 03/28/2015  . 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 Right bundle-branch block 05/03/2012     Past Surgical History:  Procedure Laterality Date  . CATARACT EXTRACTION W/PHACO Left 07/13/2018   Procedure: CATARACT EXTRACTION PHACO AND INTRAOCULAR LENS PLACEMENT (IOC);  Surgeon: [name], MD;  Location: [facility] ORS;  Service: Ophthalmology;  Laterality: Left;  [place] 00:39  CDE 5.14 Fluid pack lot # [id number]  . CATARACT EXTRACTION W/PHACO Right 08/03/2018   Procedure: CATARACT EXTRACTION PHACO AND INTRAOCULAR LENS PLACEMENT (IOC);  Surgeon: [name], MD;  Location: [facility] ORS;  Service: Ophthalmology;  Laterality: Right;  [place] 00:43.4 CDE 5.23 Fluid pack Lot # [id number]  . COLONOSCOPY WITH PROPOFOL N/A 03/27/2017  Procedure: COLONOSCOPY WITH PROPOFOL;  Surgeon: [name], MD;  Location: [facility];  Service: Endoscopy;  Laterality: N/A;  . MOHS SURGERY     EAR  . PROSTATE SURGERY  2008   Prostate Biopsy in [place] to elevated PSA.  per patient normal  . Skin Lesion Basal cell removed    . wart removal     from eyelid     Prior to Admission medications   Medication Sig Start Date End Date Taking? Authorizing Provider  acetaminophen  (TYLENOL) 500 MG tablet Take 500 mg by mouth daily as needed for moderate pain.    [provider]  allopurinol (ZYLOPRIM) 300 MG tablet Take 1 tablet (300 mg total) by mouth daily. 02/21/19   Lequita Asal, MD  amoxicillin-clavulanate (AUGMENTIN) 875-125 MG tablet Take 1 tablet by mouth 2 (two) times daily for 7 days. 04/03/19 04/10/19  Duffy Bruce, MD  Cholecalciferol (VITAMIN D3) 1000 units CAPS Take 1,000 Units by mouth daily at 12 noon.     [provider]  Cyanocobalamin (RA VITAMIN B-12 TR) 1000 MCG TBCR Take 1,000 mcg by mouth daily at 12 noon.     [provider]  ferrous sulfate 325 (65 FE) MG tablet Take 325 mg by mouth every Monday, Wednesday, and Friday.     [provider]  finasteride (PROSCAR) 5 MG tablet Take 1 tablet (5 mg total) by mouth every evening. 09/13/18   Hollice Espy, MD  folic acid (FOLVITE) 1 MG tablet Take 1 mg by mouth daily at 12 noon.  03/20/15   [provider]  JAKAFI 25 MG tablet TAKE 1 TABLET (25 MG TOTAL) BY MOUTH 2 TIMES DAILY. 11/09/18   Karen Kitchens, NP  latanoprost (XALATAN) 0.005 % ophthalmic solution Place 1 drop into both eyes at bedtime.     [provider]  Multiple Vitamins-Minerals (CENTRUM SILVER ULTRA MENS) TABS Take 1 tablet by mouth daily at 12 noon.     [provider]  Omega-3 Fatty Acids (KP FISH OIL) 1200 MG CAPS Take 1,200 mg by mouth daily.     [provider]  ondansetron (ZOFRAN ODT) 4 MG disintegrating tablet Take 1 tablet (4 mg total) by mouth every 8 (eight) hours as needed for nausea or vomiting. 04/03/19   Duffy Bruce, MD  predniSONE (DELTASONE) 20 MG tablet Take 2 tablets (40 mg total) by mouth daily for 7 days. 04/03/19 04/10/19  Duffy Bruce, MD  sodium bicarbonate 650 MG tablet Take 650 mg by mouth 2 (two) times daily. 06/06/17   [provider]  timolol (TIMOPTIC) 0.5 % ophthalmic solution Place 1 drop into both eyes daily. In AM 05/27/18    [provider]     Allergies Patient has no known allergies.   Family History  Problem Relation Age of Onset  . Cancer Mother        Liver  . Cancer Father        Prostate  . Cancer Sister        Hodgkins lymphoma  . Cancer Paternal Aunt        Breast    Social History Social History   Tobacco Use  . Smoking status: Former Smoker    Packs/day: 0.25    Years: 20.00    Pack years: 5.00    Types: Cigarettes    Quit date: 09/28/2003    Years since quitting: 15.5  . Smokeless tobacco: Never Used  Substance Use Topics  . Alcohol  use: Yes    Alcohol/week: 4.0 - 5.0 standard drinks    Types: 4 - 5 Glasses of wine per week  . Drug use: No    Review of Systems  Constitutional:   No fever or chills.  ENT:   No sore throat. No rhinorrhea. Cardiovascular:   No chest pain or syncope. Respiratory:   No dyspnea or cough. Gastrointestinal: Positive as above for abdominal pain.  Had one episode of vomiting earlier today, but none more recently.  No constipation.  Musculoskeletal:   Negative for focal pain or swelling All other systems reviewed and are negative except as documented above in ROS and HPI.  ____________________________________________   PHYSICAL EXAM:  VITAL SIGNS: ED Triage Vitals  Enc Vitals Group     BP 04/05/19 1440 (!) 136/113     Pulse Rate 04/05/19 1440 74     Resp 04/05/19 1440 20     Temp 04/05/19 1440 97.8 F (36.6 C)     Temp Source 04/05/19 1440 Oral     SpO2 04/05/19 1440 100 %     Weight 04/05/19 1442 179 lb 14.3 oz (81.6 kg)     Height 04/05/19 1442 6' (1.829 m)     Head Circumference --      Peak Flow --      Pain Score 04/05/19 1447 3     Pain Loc --      Pain Edu? --      Excl. in [place]? --     Vital signs reviewed, nursing assessments reviewed.   Constitutional:   Alert and oriented. Non-toxic appearance. Eyes:   Conjunctivae are normal. EOMI. PERRL. ENT      Head:   Normocephalic and atraumatic.      Nose:   No  congestion/rhinnorhea.       Mouth/Throat:   MMM, no pharyngeal erythema. No peritonsillar mass.       Neck:   No meningismus. Full ROM. Hematological/Lymphatic/Immunilogical:   No cervical lymphadenopathy. Cardiovascular:   RRR. Symmetric bilateral radial and DP pulses.  No murmurs. Cap refill less than 2 seconds. Respiratory:   Normal respiratory effort without tachypnea/retractions. Breath sounds are clear and equal bilaterally. No wheezes/rales/rhonchi. Gastrointestinal:   Soft and nontender. Non distended. There is no CVA tenderness.  No rebound, rigidity, or guarding.  Rectal exam shows no frank blood, brown-tinged secretions, Hemoccult negative.  There is prostatic enlargement without tenderness  Musculoskeletal:   Normal range of motion in all extremities. No joint effusions.  No lower extremity tenderness.  No edema. Neurologic:   Normal speech and language.  Normal gait.  Normal balance and coordination. Motor grossly intact. No acute focal neurologic deficits are appreciated.  Skin:    Skin is warm, dry and intact. No rash noted.  No petechiae, purpura, or bullae.  ____________________________________________    LABS (pertinent positives/negatives) (all labs ordered are listed, but only abnormal results are displayed) Labs Reviewed  COMPREHENSIVE METABOLIC PANEL - Abnormal; Notable for the following components:      Result Value   Sodium 123 (*)    Chloride 89 (*)    CO2 19 (*)    Glucose, Bld 112 (*)    BUN 37 (*)    Creatinine, Ser 1.30 (*)    Total Protein 8.6 (*)    AST 51 (*)    GFR calc non Af Amer 55 (*)    All other components within normal limits  CBC - Abnormal; Notable for the following components:  WBC 48.7 (*)    Hemoglobin 12.5 (*)    HCT 37.5 (*)    RDW 20.3 (*)    nRBC 0.5 (*)    All other components within normal limits  URINALYSIS, COMPLETE (UACMP) WITH MICROSCOPIC - Abnormal; Notable for the following components:   Color, Urine YELLOW (*)     APPearance CLEAR (*)    Hgb urine dipstick SMALL (*)    Protein, ur 100 (*)    All other components within normal limits  LIPASE, BLOOD   ____________________________________________   EKG    ____________________________________________    RADIOLOGY  No results found.  ____________________________________________   PROCEDURES Procedures  ____________________________________________    CLINICAL IMPRESSION / ASSESSMENT AND PLAN / ED COURSE  Medications ordered in the ED: Medications  sodium chloride 0.9 % bolus 1,000 mL (has no administration in time range)    Pertinent labs & imaging results that were available during my care of the patient were reviewed by me and considered in my medical decision making (see chart for details).  Ruben [name] was evaluated in Emergency Department on 04/05/2019 for the symptoms described in the history of present illness. He was evaluated in the context of the global COVID-19 pandemic, which necessitated consideration that the patient might be at risk for infection with the SARS-CoV-2 virus that causes COVID-19. Institutional protocols and algorithms that pertain to the evaluation of patients at risk for COVID-19 are in a state of rapid change based on information released by regulatory bodies including the CDC and federal and state organizations. These policies and algorithms were followed during the patient's care in the ED.   Patient presents for evaluation of suspected hematemesis in primary care clinic.  Rectal exam is negative.  History suggest that this may have been coffee.  Labs show leukocytosis of 48,000, increased from chronic baseline of 30,000.  I think this is attributable to the prednisone use given that the patient is otherwise nontoxic and actually quite well-appearing with normal vital signs.  Additionally, his sodium level is 123 which I attribute to dehydration and decreased food intake over the last several days.  I  offered hospitalization but patient does feel better today and feels comfortable managing this at home now that his symptoms are improving.  I will give him a liter of saline for hydration and plan to discharge home.  Return precautions have been discussed.  He is taking his Augmentin and Zofran and prednisone.  He has an appointment with his hematologist, Dr. Mike Gip, tomorrow morning.  Clinical Course as of Apr 04 1925  Tue Apr 05, 2019  1756 Chloride(!): 89 [PS]  1809 Potassium: 4.0 [PS]    Clinical Course User Index [PS] Carrie Mew, MD     ____________________________________________   FINAL CLINICAL IMPRESSION(S) / ED DIAGNOSES    Final diagnoses:  Generalized abdominal pain  Crohn's disease of colon without complication ([place])  Dehydration  Hyponatremia  Leukocytosis, unspecified type     ED Discharge Orders    None      Portions of this note were generated with dragon dictation software. Dictation errors may occur despite best attempts at proofreading.   Carrie Mew, MD 04/05/19 [phone]

## 2019-04-05 NOTE — ED Triage Notes (Signed)
First Nurse Note:  Sent to ED by Dr. Wynetta Emery: [phone]) for continued testing due to patient vomiting up black looking emesis this morning.  Seen through ED on 04/03/2019.  Patient is AAOx3.  Skin warm and dry.  NAD

## 2019-04-05 NOTE — ED Notes (Signed)
md at bedside to assess and plan care.

## 2019-04-05 NOTE — Discharge Instructions (Signed)
Continue taking the Augmentin and prednisone as prescribed, and Zofran as needed.  Be sure to drink plenty of fluids.  It will be important to eat regularly or at least take in salt with your fluids to ensure replacement of your electrolytes due to your low sodium level today.  Your white blood cell count was 48,000 today, and increased from your usual level of about 30,000.  This appears to be due to the prednisone use, but you should continue following up with your doctors as scheduled to monitor your progress.  Return to the emergency room if you have any new or worsening symptoms or other concerns.

## 2019-04-06 ENCOUNTER — Inpatient Hospital Stay: Payer: Medicare Other

## 2019-04-06 ENCOUNTER — Encounter: Payer: Self-pay | Admitting: Internal Medicine

## 2019-04-06 ENCOUNTER — Observation Stay
Admission: AD | Admit: 2019-04-06 | Discharge: 2019-04-07 | Disposition: A | Discharge status: Home / Self Care | Payer: Medicare Other | Source: Ambulatory Visit | Attending: Internal Medicine | Admitting: Internal Medicine

## 2019-04-06 ENCOUNTER — Inpatient Hospital Stay: Payer: Medicare Other | Attending: Hematology and Oncology

## 2019-04-06 ENCOUNTER — Encounter: Payer: Self-pay | Admitting: Hematology and Oncology

## 2019-04-06 ENCOUNTER — Telehealth: Payer: Self-pay

## 2019-04-06 ENCOUNTER — Inpatient Hospital Stay (HOSPITAL_BASED_OUTPATIENT_CLINIC_OR_DEPARTMENT_OTHER): Payer: Medicare Other | Admitting: Hematology and Oncology

## 2019-04-06 ENCOUNTER — Other Ambulatory Visit: Payer: Self-pay

## 2019-04-06 VITALS — BP 155/91 | HR 74 | Temp 97.3°F | Resp 16 | Wt 169.5 lb

## 2019-04-06 DIAGNOSIS — E860 Dehydration: Secondary | ICD-10-CM | POA: Diagnosis not present

## 2019-04-06 DIAGNOSIS — K5000 Crohn's disease of small intestine without complications: Secondary | ICD-10-CM

## 2019-04-06 DIAGNOSIS — Z79899 Other long term (current) drug therapy: Secondary | ICD-10-CM | POA: Insufficient documentation

## 2019-04-06 DIAGNOSIS — I4510 Unspecified right bundle-branch block: Secondary | ICD-10-CM | POA: Insufficient documentation

## 2019-04-06 DIAGNOSIS — M545 Low back pain: Secondary | ICD-10-CM | POA: Diagnosis not present

## 2019-04-06 DIAGNOSIS — N400 Enlarged prostate without lower urinary tract symptoms: Secondary | ICD-10-CM | POA: Insufficient documentation

## 2019-04-06 DIAGNOSIS — Z87891 Personal history of nicotine dependence: Secondary | ICD-10-CM | POA: Insufficient documentation

## 2019-04-06 DIAGNOSIS — M549 Dorsalgia, unspecified: Secondary | ICD-10-CM | POA: Insufficient documentation

## 2019-04-06 DIAGNOSIS — D7581 Myelofibrosis: Secondary | ICD-10-CM | POA: Insufficient documentation

## 2019-04-06 DIAGNOSIS — R634 Abnormal weight loss: Secondary | ICD-10-CM | POA: Diagnosis not present

## 2019-04-06 DIAGNOSIS — H409 Unspecified glaucoma: Secondary | ICD-10-CM | POA: Diagnosis not present

## 2019-04-06 DIAGNOSIS — Z7189 Other specified counseling: Secondary | ICD-10-CM

## 2019-04-06 DIAGNOSIS — Z8739 Personal history of other diseases of the musculoskeletal system and connective tissue: Secondary | ICD-10-CM | POA: Diagnosis not present

## 2019-04-06 DIAGNOSIS — N189 Chronic kidney disease, unspecified: Secondary | ICD-10-CM | POA: Diagnosis not present

## 2019-04-06 DIAGNOSIS — D45 Polycythemia vera: Secondary | ICD-10-CM | POA: Diagnosis not present

## 2019-04-06 DIAGNOSIS — R062 Wheezing: Secondary | ICD-10-CM | POA: Diagnosis not present

## 2019-04-06 DIAGNOSIS — K5090 Crohn's disease, unspecified, without complications: Secondary | ICD-10-CM

## 2019-04-06 DIAGNOSIS — R05 Cough: Secondary | ICD-10-CM | POA: Diagnosis not present

## 2019-04-06 DIAGNOSIS — Z8719 Personal history of other diseases of the digestive system: Secondary | ICD-10-CM

## 2019-04-06 DIAGNOSIS — M109 Gout, unspecified: Secondary | ICD-10-CM | POA: Insufficient documentation

## 2019-04-06 DIAGNOSIS — N401 Enlarged prostate with lower urinary tract symptoms: Secondary | ICD-10-CM | POA: Insufficient documentation

## 2019-04-06 DIAGNOSIS — D72829 Elevated white blood cell count, unspecified: Secondary | ICD-10-CM | POA: Insufficient documentation

## 2019-04-06 DIAGNOSIS — D751 Secondary polycythemia: Secondary | ICD-10-CM | POA: Diagnosis not present

## 2019-04-06 DIAGNOSIS — E871 Hypo-osmolality and hyponatremia: Secondary | ICD-10-CM | POA: Insufficient documentation

## 2019-04-06 DIAGNOSIS — Z148 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: Secondary | ICD-10-CM

## 2019-04-06 DIAGNOSIS — D500 Iron deficiency anemia secondary to blood loss (chronic): Secondary | ICD-10-CM

## 2019-04-06 DIAGNOSIS — D509 Iron deficiency anemia, unspecified: Secondary | ICD-10-CM

## 2019-04-06 DIAGNOSIS — R338 Other retention of urine: Secondary | ICD-10-CM | POA: Insufficient documentation

## 2019-04-06 DIAGNOSIS — Z1159 Encounter for screening for other viral diseases: Secondary | ICD-10-CM | POA: Insufficient documentation

## 2019-04-06 DIAGNOSIS — M546 Pain in thoracic spine: Secondary | ICD-10-CM | POA: Insufficient documentation

## 2019-04-06 LAB — CBC WITH DIFFERENTIAL/PLATELET
Abs Immature Granulocytes: 8.1 10*3/uL — ABNORMAL HIGH (ref 0.00–0.07)
Basophils Absolute: 0.6 10*3/uL — ABNORMAL HIGH (ref 0.0–0.1)
Basophils Relative: 1 %
Eosinophils Absolute: 0.3 10*3/uL (ref 0.0–0.5)
Eosinophils Relative: 1 %
HCT: 36.5 % — ABNORMAL LOW (ref 39.0–52.0)
Hemoglobin: 12.3 g/dL — ABNORMAL LOW (ref 13.0–17.0)
Immature Granulocytes: 18 %
Lymphocytes Relative: 14 %
Lymphs Abs: 6.4 10*3/uL — ABNORMAL HIGH (ref 0.7–4.0)
MCH: 28 pg (ref 26.0–34.0)
MCHC: 33.7 g/dL (ref 30.0–36.0)
MCV: 83 fL (ref 80.0–100.0)
Monocytes Absolute: 1.8 10*3/uL — ABNORMAL HIGH (ref 0.1–1.0)
Monocytes Relative: 4 %
Neutro Abs: 28 10*3/uL — ABNORMAL HIGH (ref 1.7–7.7)
Neutrophils Relative %: 62 %
Platelets: 166 10*3/uL (ref 150–400)
RBC: 4.4 MIL/uL (ref 4.22–5.81)
RDW: 20.4 % — ABNORMAL HIGH (ref 11.5–15.5)
WBC: 45.1 10*3/uL — ABNORMAL HIGH (ref 4.0–10.5)
nRBC: 0.6 % — ABNORMAL HIGH (ref 0.0–0.2)

## 2019-04-06 LAB — COMPREHENSIVE METABOLIC PANEL
ALT: 21 U/L (ref 0–44)
AST: 49 U/L — ABNORMAL HIGH (ref 15–41)
Albumin: 4.4 g/dL (ref 3.5–5.0)
Alkaline Phosphatase: 79 U/L (ref 38–126)
Anion gap: 10 (ref 5–15)
BUN: 33 mg/dL — ABNORMAL HIGH (ref 8–23)
CO2: 20 mmol/L — ABNORMAL LOW (ref 22–32)
Calcium: 8.8 mg/dL — ABNORMAL LOW (ref 8.9–10.3)
Chloride: 90 mmol/L — ABNORMAL LOW (ref 98–111)
Creatinine, Ser: 1.27 mg/dL — ABNORMAL HIGH (ref 0.61–1.24)
GFR calc Af Amer: >60 mL/min (ref >60)
GFR calc non Af Amer: 56 mL/min — ABNORMAL LOW (ref >60)
Glucose, Bld: 150 mg/dL — ABNORMAL HIGH (ref 70–99)
Potassium: 3.5 mmol/L (ref 3.5–5.1)
Sodium: 120 mmol/L — ABNORMAL LOW (ref 135–145)
Total Bilirubin: 1 mg/dL (ref 0.3–1.2)
Total Protein: 8.5 g/dL — ABNORMAL HIGH (ref 6.5–8.1)

## 2019-04-06 LAB — SARS CORONAVIRUS 2 BY RT PCR (HOSPITAL ORDER, PERFORMED IN ~~LOC~~ HOSPITAL LAB): SARS Coronavirus 2: NEGATIVE

## 2019-04-06 LAB — LACTATE DEHYDROGENASE: LDH: 1082 U/L — ABNORMAL HIGH (ref 98–192)

## 2019-04-06 LAB — URIC ACID: Uric Acid, Serum: 3.9 mg/dL (ref 3.7–8.6)

## 2019-04-06 LAB — FERRITIN: Ferritin: 163 ng/mL (ref 24–336)

## 2019-04-06 MED ORDER — SODIUM BICARBONATE 650 MG PO TABS
650.0000 mg | ORAL_TABLET | Freq: Two times a day (BID) | ORAL | Status: DC
  Administered 2019-04-06 – 2019-04-07 (×2): 650 mg via ORAL
  Filled 2019-04-06 (×3): qty 1

## 2019-04-06 MED ORDER — ENOXAPARIN SODIUM 40 MG/0.4ML ~~LOC~~ SOLN
40.0000 mg | SUBCUTANEOUS | Status: DC
  Filled 2019-04-06: qty 0.4

## 2019-04-06 MED ORDER — VITAMIN B-12 1000 MCG PO TABS
1000.0000 ug | ORAL_TABLET | Freq: Every day | ORAL | Status: DC
  Administered 2019-04-07: 10:00:00 1000 ug via ORAL
  Filled 2019-04-06: qty 1

## 2019-04-06 MED ORDER — ADULT MULTIVITAMIN W/MINERALS CH
1.0000 | ORAL_TABLET | Freq: Every day | ORAL | Status: DC
  Administered 2019-04-07: 10:00:00 1 via ORAL
  Filled 2019-04-06: qty 1

## 2019-04-06 MED ORDER — ALLOPURINOL 100 MG PO TABS
300.0000 mg | ORAL_TABLET | Freq: Every day | ORAL | Status: DC
  Administered 2019-04-07: 10:00:00 300 mg via ORAL
  Filled 2019-04-06: qty 3

## 2019-04-06 MED ORDER — SENNOSIDES-DOCUSATE SODIUM 8.6-50 MG PO TABS: 1.0000 | ORAL_TABLET | Freq: Every evening | ORAL | Status: DC | PRN

## 2019-04-06 MED ORDER — SODIUM CHLORIDE 0.9 % IV SOLN
Freq: Once | INTRAVENOUS | Status: AC
  Administered 2019-04-06: 11:00:00 via INTRAVENOUS
  Filled 2019-04-06: qty 250

## 2019-04-06 MED ORDER — VITAMIN D 25 MCG (1000 UNIT) PO TABS
1000.0000 [IU] | ORAL_TABLET | Freq: Every day | ORAL | Status: DC
  Administered 2019-04-07: 1000 [IU] via ORAL
  Filled 2019-04-06: qty 1

## 2019-04-06 MED ORDER — OMEGA-3-ACID ETHYL ESTERS 1 G PO CAPS
1000.0000 mg | ORAL_CAPSULE | Freq: Every day | ORAL | Status: DC
  Administered 2019-04-07: 1000 mg via ORAL
  Filled 2019-04-06: qty 1

## 2019-04-06 MED ORDER — ONDANSETRON HCL 4 MG/2ML IJ SOLN: 4.0000 mg | Freq: Four times a day (QID) | INTRAMUSCULAR | Status: DC | PRN

## 2019-04-06 MED ORDER — ONDANSETRON HCL 4 MG PO TABS: 4.0000 mg | ORAL_TABLET | Freq: Four times a day (QID) | ORAL | Status: DC | PRN

## 2019-04-06 MED ORDER — ACETAMINOPHEN 650 MG RE SUPP: 650.0000 mg | Freq: Four times a day (QID) | RECTAL | Status: DC | PRN

## 2019-04-06 MED ORDER — METHYLPREDNISOLONE SODIUM SUCC 125 MG IJ SOLR
60.0000 mg | INTRAMUSCULAR | Status: DC
  Administered 2019-04-06: 21:00:00 60 mg via INTRAVENOUS
  Filled 2019-04-06: qty 2

## 2019-04-06 MED ORDER — POTASSIUM CHLORIDE CRYS ER 20 MEQ PO TBCR
20.0000 meq | EXTENDED_RELEASE_TABLET | Freq: Once | ORAL | Status: AC
  Administered 2019-04-06: 20 meq via ORAL
  Filled 2019-04-06: qty 1

## 2019-04-06 MED ORDER — SODIUM CHLORIDE 0.9 % IV SOLN
INTRAVENOUS | Status: DC
  Administered 2019-04-06 – 2019-04-07 (×2): via INTRAVENOUS

## 2019-04-06 MED ORDER — FERROUS SULFATE 325 (65 FE) MG PO TABS: 325.0000 mg | ORAL_TABLET | ORAL | Status: DC

## 2019-04-06 MED ORDER — ACETAMINOPHEN 325 MG PO TABS: 650.0000 mg | ORAL_TABLET | Freq: Four times a day (QID) | ORAL | Status: DC | PRN

## 2019-04-06 MED ORDER — RUXOLITINIB PHOSPHATE 25 MG PO TABS
25.0000 mg | ORAL_TABLET | Freq: Two times a day (BID) | ORAL | Status: DC
  Administered 2019-04-06 – 2019-04-07 (×2): 25 mg via ORAL
  Filled 2019-04-06 (×2): qty 1

## 2019-04-06 MED ORDER — FOLIC ACID 1 MG PO TABS
1.0000 mg | ORAL_TABLET | Freq: Every day | ORAL | Status: DC
  Administered 2019-04-07: 10:00:00 1 mg via ORAL
  Filled 2019-04-06: qty 1

## 2019-04-06 MED ORDER — LATANOPROST 0.005 % OP SOLN
1.0000 [drp] | Freq: Every day | OPHTHALMIC | Status: DC
  Administered 2019-04-06: 21:00:00 1 [drp] via OPHTHALMIC
  Filled 2019-04-06: qty 2.5

## 2019-04-06 MED ORDER — PREDNISONE 20 MG PO TABS: 40.0000 mg | ORAL_TABLET | Freq: Every day | ORAL | Status: DC

## 2019-04-06 MED ORDER — FINASTERIDE 5 MG PO TABS
5.0000 mg | ORAL_TABLET | Freq: Every evening | ORAL | Status: DC
  Administered 2019-04-06: 18:00:00 5 mg via ORAL
  Filled 2019-04-06: qty 1

## 2019-04-06 MED ORDER — TIMOLOL MALEATE 0.5 % OP SOLN
1.0000 [drp] | Freq: Every day | OPHTHALMIC | Status: DC
  Administered 2019-04-07: 10:00:00 1 [drp] via OPHTHALMIC
  Filled 2019-04-06: qty 5

## 2019-04-06 NOTE — Progress Notes (Signed)
Advanced care plan. Purpose of the Encounter: CODE STATUS Parties in Attendance: Patient Patient's Decision Capacity: Good Subjective/Patient's story:  Ruben [name]  is a 73 y.o. male with a known history of Crohn's disease, benign prostate hypertrophy, myelofibrosis, glaucoma, polycythemia presented to the hospital after being referred from oncology clinic.  Patient has abdominal discomfort nausea and vomiting and diarrhea for the last couple of days.  Has generalized weakness.  Appears dry and dehydrated.  No blood in the stool.  No chest pain and shortness of breath. Objective/Medical story Patient appears dry and dehydrated.  Needs aggressive IV fluids.  Sodium level needs to be monitored closely.  Needs oncology and gastroenterology evaluation. Goals of care determination:  Advance care directives goals of care treatment plan discussed Patient wants everything done which includes CPR, intubation ventilator if the need arises CODE STATUS:  Full code Time spent discussing advanced care planning: 16 minutes

## 2019-04-06 NOTE — Patient Instructions (Signed)
Dehydration, Adult  Dehydration is when there is not enough fluid or water in your body. This happens when you lose more fluids than you take in. Dehydration can range from mild to very bad. It should be treated right away to keep it from getting very bad. Symptoms of mild dehydration may include:  Thirst.  Dry lips.  Slightly dry mouth.  Dry, warm skin.  Dizziness. Symptoms of moderate dehydration may include:  Very dry mouth.  Muscle cramps.  Dark pee (urine). Pee may be the color of tea.  Your body making less pee.  Your eyes making fewer tears.  Heartbeat that is uneven or faster than normal (palpitations).  Headache.  Light-headedness, especially when you stand up from sitting.  Fainting (syncope). Symptoms of very bad dehydration may include:  Changes in skin, such as: ? Cold and clammy skin. ? Blotchy (mottled) or pale skin. ? Skin that does not quickly return to normal after being lightly pinched and let go (poor skin turgor).  Changes in body fluids, such as: ? Feeling very thirsty. ? Your eyes making fewer tears. ? Not sweating when body temperature is high, such as in hot weather. ? Your body making very little pee.  Changes in vital signs, such as: ? Weak pulse. ? Pulse that is more than 100 beats a minute when you are sitting still. ? Fast breathing. ? Low blood pressure.  Other changes, such as: ? Sunken eyes. ? Cold hands and feet. ? Confusion. ? Lack of energy (lethargy). ? Trouble waking up from sleep. ? Short-term weight loss. ? Unconsciousness. Follow these instructions at home:   If told by your doctor, drink an ORS: ? Make an ORS by using instructions on the package. ? Start by drinking small amounts, about  cup (120 mL) every 5-10 minutes. ? Slowly drink more until you have had the amount that your doctor said to have.  Drink enough clear fluid to keep your pee clear or pale yellow. If you were told to drink an ORS, finish the  ORS first, then start slowly drinking clear fluids. Drink fluids such as: ? Water. Do not drink only water by itself. Doing that can make the salt (sodium) level in your body get too low (hyponatremia). ? Ice chips. ? Fruit juice that you have added water to (diluted). ? Low-calorie sports drinks.  Avoid: ? Alcohol. ? Drinks that have a lot of sugar. These include high-calorie sports drinks, fruit juice that does not have water added, and soda. ? Caffeine. ? Foods that are greasy or have a lot of fat or sugar.  Take over-the-counter and prescription medicines only as told by your doctor.  Do not take salt tablets. Doing that can make the salt level in your body get too high (hypernatremia).  Eat foods that have minerals (electrolytes). Examples include bananas, oranges, potatoes, tomatoes, and spinach.  Keep all follow-up visits as told by your doctor. This is important. Contact a doctor if:  You have belly (abdominal) pain that: ? Gets worse. ? Stays in one area (localizes).  You have a rash.  You have a stiff neck.  You get angry or annoyed more easily than normal (irritability).  You are more sleepy than normal.  You have a harder time waking up than normal.  You feel: ? Weak. ? Dizzy. ? Very thirsty.  You have peed (urinated) only a small amount of very dark pee during 6-8 hours. Get help right away if:  You have  symptoms of very bad dehydration.  You cannot drink fluids without throwing up (vomiting).  Your symptoms get worse with treatment.  You have a fever.  You have a very bad headache.  You are throwing up or having watery poop (diarrhea) and it: ? Gets worse. ? Does not go away.  You have blood or something green (bile) in your throw-up.  You have blood in your poop (stool). This may cause poop to look black and tarry.  You have not peed in 6-8 hours.  You pass out (faint).  Your heart rate when you are sitting still is more than 100 beats a  minute.  You have trouble breathing. This information is not intended to replace advice given to you by your health care provider. Make sure you discuss any questions you have with your health care provider. Document Released: 07/05/2009 Document Revised: 08/21/2017 Document Reviewed: 11/02/2015 Elsevier Patient Education  2020 [employer].

## 2019-04-06 NOTE — Progress Notes (Signed)
Pt here for follow up. Patient currently being treated with antibiotic and prednisone for Crohns flare up. Patient also reports back pain. Denies any other concerns.

## 2019-04-06 NOTE — Telephone Encounter (Signed)
Admission report given to Estill Bamberg, RN on 1C. RN verbalizes understanding and denies any further questions. Patient is to go to room 129.

## 2019-04-06 NOTE — H&P (Signed)
[facility] NAME: Ruben [name]    MR#:  [id number]  DATE OF BIRTH:  1946-09-02  DATE OF ADMISSION:  04/06/2019  PRIMARY CARE PHYSICIAN: [name], MD   REQUESTING/REFERRING PHYSICIAN:   CHIEF COMPLAINT: Patient referred from oncology clinic for nausea vomiting and diarrhea  HISTORY OF PRESENT ILLNESS: Ruben [name]  is a 73 y.o. male with a known history of Crohn's disease, benign prostate hypertrophy, myelofibrosis, glaucoma, polycythemia presented to the hospital after being referred from oncology clinic.  Patient has abdominal discomfort nausea and vomiting and diarrhea for the last couple of days.  Has generalized weakness.  Appears dry and dehydrated.  Hospitalist service was consulted for further care.  No blood in the stool.  No chest pain and shortness of breath.  PAST MEDICAL HISTORY:   Past Medical History:  Diagnosis Date  . Blood dyscrasia   . BPH (benign prostatic hyperplasia)   . Cancer ([place])    SKIN/ POLYCYTHEMIA VERA  . Chronic kidney disease    RENAL INSUFF (40%)  . Crohn disease ([place])   . Crohn's disease ([place])   . Glaucoma   . Glaucoma   . Gout   . History of chicken pox   . Myelofibrosis ([place])   . Nosebleed   . RBBB   . Right bundle branch block     PAST SURGICAL HISTORY:  Past Surgical History:  Procedure Laterality Date  . CATARACT EXTRACTION W/PHACO Left 07/13/2018   Procedure: CATARACT EXTRACTION PHACO AND INTRAOCULAR LENS PLACEMENT (IOC);  Surgeon: [name], MD;  Location: [facility] ORS;  Service: Ophthalmology;  Laterality: Left;  [place] 00:39  CDE 5.14 Fluid pack lot # [id number]  . CATARACT EXTRACTION W/PHACO Right 08/03/2018   Procedure: CATARACT EXTRACTION PHACO AND INTRAOCULAR LENS PLACEMENT (IOC);  Surgeon: [name], MD;  Location: [facility] ORS;  Service: Ophthalmology;  Laterality: Right;  [place] 00:43.4 CDE 5.23 Fluid pack Lot # [id number]  . COLONOSCOPY WITH PROPOFOL N/A  03/27/2017   Procedure: COLONOSCOPY WITH PROPOFOL;  Surgeon: [name], MD;  Location: [facility] ENDOSCOPY;  Service: Endoscopy;  Laterality: N/A;  . MOHS SURGERY     EAR  . PROSTATE SURGERY  2008   Prostate Biopsy in [place] to elevated PSA.  per patient normal  . Skin Lesion Basal cell removed    . wart removal     from eyelid    SOCIAL HISTORY:  Social History   Tobacco Use  . Smoking status: Former Smoker    Packs/day: 0.25    Years: 20.00    Pack years: 5.00    Types: Cigarettes    Quit date: 09/28/2003    Years since quitting: 15.5  . Smokeless tobacco: Never Used  Substance Use Topics  . Alcohol use: Yes    Alcohol/week: 4.0 - 5.0 standard drinks    Types: 4 - 5 Glasses of wine per week    FAMILY HISTORY:  Family History  Problem Relation Age of Onset  . Cancer Mother        Liver  . Cancer Father        Prostate  . Cancer Sister        Hodgkins lymphoma  . Cancer Paternal Aunt        Breast    DRUG ALLERGIES: No Known Allergies  REVIEW OF SYSTEMS:   CONSTITUTIONAL: No fever, has fatigue and weakness.  EYES: No blurred or double vision.  EARS, NOSE, AND  THROAT: No tinnitus or ear pain.  RESPIRATORY: No cough, shortness of breath, wheezing or hemoptysis.  CARDIOVASCULAR: No chest pain, orthopnea, edema.  GASTROINTESTINAL: Has nausea, vomiting, diarrhea and abdominal pain.  GENITOURINARY: No dysuria, hematuria.  ENDOCRINE: No polyuria, nocturia,  HEMATOLOGY: No anemia, easy bruising or bleeding SKIN: No rash or lesion. MUSCULOSKELETAL: No joint pain or arthritis.   NEUROLOGIC: No tingling, numbness, weakness.  PSYCHIATRY: No anxiety or depression.   MEDICATIONS AT HOME:  Prior to Admission medications   Medication Sig Start Date End Date Taking? Authorizing Provider  acetaminophen (TYLENOL) 500 MG tablet Take 500 mg by mouth daily as needed for moderate pain.   Yes [provider]  allopurinol (ZYLOPRIM) 300 MG tablet Take 1 tablet  (300 mg total) by mouth daily. 02/21/19  Yes Lequita Asal, MD  amoxicillin-clavulanate (AUGMENTIN) 875-125 MG tablet Take 1 tablet by mouth 2 (two) times daily for 7 days. 04/03/19 04/10/19 Yes Duffy Bruce, MD  Cholecalciferol (VITAMIN D3) 1000 units CAPS Take 1,000 Units by mouth daily at 12 noon.    Yes [provider]  Cyanocobalamin (RA VITAMIN B-12 TR) 1000 MCG TBCR Take 1,000 mcg by mouth daily at 12 noon.    Yes [provider]  ferrous sulfate 325 (65 FE) MG tablet Take 325 mg by mouth every Monday, Wednesday, and Friday.    Yes [provider]  finasteride (PROSCAR) 5 MG tablet Take 1 tablet (5 mg total) by mouth every evening. 09/13/18  Yes Hollice Espy, MD  folic acid (FOLVITE) 1 MG tablet Take 1 mg by mouth daily at 12 noon.  03/20/15  Yes [provider]  JAKAFI 25 MG tablet TAKE 1 TABLET (25 MG TOTAL) BY MOUTH 2 TIMES DAILY. 11/09/18  Yes Karen Kitchens, NP  latanoprost (XALATAN) 0.005 % ophthalmic solution Place 1 drop into both eyes at bedtime.    Yes [provider]  Multiple Vitamins-Minerals (CENTRUM SILVER ULTRA MENS) TABS Take 1 tablet by mouth daily at 12 noon.    Yes [provider]  Omega-3 Fatty Acids (KP FISH OIL) 1200 MG CAPS Take 1,200 mg by mouth daily.    Yes [provider]  ondansetron (ZOFRAN ODT) 4 MG disintegrating tablet Take 1 tablet (4 mg total) by mouth every 8 (eight) hours as needed for nausea or vomiting. 04/03/19  Yes Duffy Bruce, MD  predniSONE (DELTASONE) 20 MG tablet Take 2 tablets (40 mg total) by mouth daily for 7 days. 04/03/19 04/10/19 Yes Duffy Bruce, MD  sodium bicarbonate 650 MG tablet Take 650 mg by mouth 2 (two) times daily. 06/06/17  Yes [provider]  timolol (TIMOPTIC) 0.5 % ophthalmic solution Place 1 drop into both eyes daily. In AM 05/27/18  Yes [provider]      PHYSICAL EXAMINATION:   VITAL SIGNS: Blood pressure (!) 160/87, pulse 81,  temperature (!) 97.5 F (36.4 C), temperature source Oral, resp. rate 19, height 6' (1.829 m), weight 83.2 kg, SpO2 100 %.  GENERAL:  73 y.o.-year-old patient lying in the bed with no acute distress.  EYES: Pupils equal, round, reactive to light and accommodation. No scleral icterus. Extraocular muscles intact.  HEENT: Head atraumatic, normocephalic. Oropharynx dry and nasopharynx clear.  NECK:  Supple, no jugular venous distention. No thyroid enlargement, no tenderness.  LUNGS: Normal breath sounds bilaterally, no wheezing, rales,rhonchi or crepitation. No use of accessory muscles of respiration.  CARDIOVASCULAR: S1, S2 normal. No murmurs, rubs, or gallops.  ABDOMEN: Soft, mild tenderness  around umbilicus, nondistended. Bowel sounds present. No organomegaly or mass.  EXTREMITIES: No pedal edema, cyanosis, or clubbing.  NEUROLOGIC: Cranial nerves II through XII are intact. Muscle strength 5/5 in all extremities. Sensation intact. Gait not checked.  PSYCHIATRIC: The patient is alert and oriented x 3.  SKIN: No obvious rash, lesion, or ulcer.   LABORATORY PANEL:   CBC Recent Labs  Lab 04/03/19 0952 04/05/19 1451 04/06/19 0844  WBC 30.6* 48.7* 45.1*  HGB 12.5* 12.5* 12.3*  HCT 39.2 37.5* 36.5*  PLT 146* 169 166  MCV 85.6 82.8 83.0  MCH 27.3 27.6 28.0  MCHC 31.9 33.3 33.7  RDW 20.6* 20.3* 20.4*  LYMPHSABS  --   --  6.4*  MONOABS  --   --  1.8*  EOSABS  --   --  0.3  BASOSABS  --   --  0.6*   ------------------------------------------------------------------------------------------------------------------  Chemistries  Recent Labs  Lab 04/03/19 0952 04/05/19 1451 04/06/19 0844  NA 132* 123* 120*  K 4.2 4.0 3.5  CL 99 89* 90*  CO2 21* 19* 20*  GLUCOSE 107* 112* 150*  BUN 23 37* 33*  CREATININE 1.47* 1.30* 1.27*  CALCIUM 9.7 8.9 8.8*  AST 42* 51* 49*  ALT 17 22 21   ALKPHOS 74 79 79  BILITOT 1.4* 1.2 1.0    ------------------------------------------------------------------------------------------------------------------ estimated creatinine clearance is 57.7 mL/min (A) (by C-G formula based on SCr of 1.27 mg/dL (H)). ------------------------------------------------------------------------------------------------------------------ No results for input(s): TSH, T4TOTAL, T3FREE, THYROIDAB in the last 72 hours.  Invalid input(s): FREET3   Coagulation profile No results for input(s): INR, PROTIME in the last 168 hours. ------------------------------------------------------------------------------------------------------------------- No results for input(s): DDIMER in the last 72 hours. -------------------------------------------------------------------------------------------------------------------  Cardiac Enzymes No results for input(s): CKMB, TROPONINI, MYOGLOBIN in the last 168 hours.  Invalid input(s): CK ------------------------------------------------------------------------------------------------------------------ Invalid input(s): POCBNP  ---------------------------------------------------------------------------------------------------------------  Urinalysis    Component Value Date/Time   COLORURINE YELLOW (A) 04/05/2019 1451   APPEARANCEUR CLEAR (A) 04/05/2019 1451   APPEARANCEUR Cloudy (A) 05/29/2017 0957   LABSPEC 1.018 04/05/2019 1451   LABSPEC 1.021 08/24/2012 1636   PHURINE 6.0 04/05/2019 1451   GLUCOSEU NEGATIVE 04/05/2019 1451   GLUCOSEU Negative 08/24/2012 1636   HGBUR SMALL (A) 04/05/2019 1451   BILIRUBINUR NEGATIVE 04/05/2019 1451   BILIRUBINUR Negative 05/29/2017 0957   BILIRUBINUR Negative 08/24/2012 1636   KETONESUR NEGATIVE 04/05/2019 1451   PROTEINUR 100 (A) 04/05/2019 1451   UROBILINOGEN negative 09/03/2015 1124   NITRITE NEGATIVE 04/05/2019 1451   LEUKOCYTESUR NEGATIVE 04/05/2019 1451   LEUKOCYTESUR Negative 08/24/2012 1636     RADIOLOGY: No  results found.  EKG: No orders found for this or any previous visit.  IMPRESSION AND PLAN: 73 year old male patient with a known history of Crohn's disease, benign prostate hypertrophy, myelofibrosis, glaucoma, polycythemia presented to the hospital after being referred from oncology clinic for nausea vomiting and diarrhea  -Acute Crohn's disease exacerbation IV fluid hydration Gastroenterology consult IV Solu-Medrol 60 mg daily  -Dehydration IV fluids  -Acute hyponatremia Secondary to dehydration and Crohn's exacerbation IV fluids and follow-up sodium level  -History of myelofibrosis Continue chemo medication follow-up with oncology  -DVT prophylaxis subcu Lovenox daily All the records are reviewed and case discussed with ED provider. Management plans discussed with the patient, family and they are in agreement.  CODE STATUS:Full code    Code Status Orders  (From admission, onward)         Start     Ordered   04/06/19 1745  Full code  Continuous     04/06/19 1744        Code Status History    This patient has a current code status but no historical code status.   Advance Care Planning Activity    Advance Directive Documentation     Most Recent Value  Type of Advance Directive  Healthcare Power of Attorney, Living will  Pre-existing out of facility DNR order (yellow form or pink MOST form)  -  "MOST" Form in Place?  -      TOTAL TIME TAKING CARE OF THIS PATIENT: 52 minutes.    Saundra Shelling M.D on 04/06/2019 at 7:27 PM  Between 7am to 6pm - Pager - [phone]  After 6pm go to [email or web address] - password [email or web address] [facility]  Office  [phone]  CC: Primary care physician; [name], MD

## 2019-04-06 NOTE — Progress Notes (Unsigned)
Patient here for IV fluid hydration.  IV access obtained and extension applied so that patient can transfer to inpatient admission without having to get a new IV access.  One liter of fluids given while waiting on room to become available. Patient states, "I feel better."

## 2019-04-07 DIAGNOSIS — K5090 Crohn's disease, unspecified, without complications: Secondary | ICD-10-CM | POA: Diagnosis not present

## 2019-04-07 DIAGNOSIS — D7581 Myelofibrosis: Secondary | ICD-10-CM

## 2019-04-07 DIAGNOSIS — E860 Dehydration: Secondary | ICD-10-CM | POA: Diagnosis not present

## 2019-04-07 DIAGNOSIS — D751 Secondary polycythemia: Secondary | ICD-10-CM | POA: Diagnosis not present

## 2019-04-07 DIAGNOSIS — D45 Polycythemia vera: Secondary | ICD-10-CM | POA: Diagnosis not present

## 2019-04-07 DIAGNOSIS — Z8739 Personal history of other diseases of the musculoskeletal system and connective tissue: Secondary | ICD-10-CM | POA: Diagnosis not present

## 2019-04-07 DIAGNOSIS — K5000 Crohn's disease of small intestine without complications: Secondary | ICD-10-CM | POA: Diagnosis not present

## 2019-04-07 DIAGNOSIS — E871 Hypo-osmolality and hyponatremia: Secondary | ICD-10-CM

## 2019-04-07 DIAGNOSIS — R161 Splenomegaly, not elsewhere classified: Secondary | ICD-10-CM | POA: Diagnosis not present

## 2019-04-07 DIAGNOSIS — Z1159 Encounter for screening for other viral diseases: Secondary | ICD-10-CM | POA: Diagnosis not present

## 2019-04-07 DIAGNOSIS — D72829 Elevated white blood cell count, unspecified: Secondary | ICD-10-CM | POA: Diagnosis not present

## 2019-04-07 LAB — BASIC METABOLIC PANEL
Anion gap: 9 (ref 5–15)
BUN: 31 mg/dL — ABNORMAL HIGH (ref 8–23)
CO2: 19 mmol/L — ABNORMAL LOW (ref 22–32)
Calcium: 8.5 mg/dL — ABNORMAL LOW (ref 8.9–10.3)
Chloride: 105 mmol/L (ref 98–111)
Creatinine, Ser: 1.18 mg/dL (ref 0.61–1.24)
GFR calc Af Amer: >60 mL/min (ref >60)
GFR calc non Af Amer: >60 mL/min (ref >60)
Glucose, Bld: 117 mg/dL — ABNORMAL HIGH (ref 70–99)
Potassium: 4.2 mmol/L (ref 3.5–5.1)
Sodium: 133 mmol/L — ABNORMAL LOW (ref 135–145)

## 2019-04-07 LAB — CBC
HCT: 36.4 % — ABNORMAL LOW (ref 39.0–52.0)
Hemoglobin: 11.5 g/dL — ABNORMAL LOW (ref 13.0–17.0)
MCH: 27 pg (ref 26.0–34.0)
MCHC: 31.6 g/dL (ref 30.0–36.0)
MCV: 85.4 fL (ref 80.0–100.0)
Platelets: 150 10*3/uL (ref 150–400)
RBC: 4.26 MIL/uL (ref 4.22–5.81)
RDW: 20.5 % — ABNORMAL HIGH (ref 11.5–15.5)
WBC: 41.9 10*3/uL — ABNORMAL HIGH (ref 4.0–10.5)
nRBC: 0.4 % — ABNORMAL HIGH (ref 0.0–0.2)

## 2019-04-07 LAB — SODIUM: Sodium: 133 mmol/L — ABNORMAL LOW (ref 135–145)

## 2019-04-07 MED ORDER — ENSURE ENLIVE PO LIQD
237.0000 mL | Freq: Two times a day (BID) | ORAL | Status: DC
  Administered 2019-04-07: 11:00:00 237 mL via ORAL

## 2019-04-07 MED ORDER — PREDNISONE 20 MG PO TABS: 60.0000 mg | ORAL_TABLET | Freq: Every day | ORAL | 0 refills | Status: AC

## 2019-04-07 MED ORDER — VITAMIN C 500 MG PO TABS: 250.0000 mg | ORAL_TABLET | Freq: Two times a day (BID) | ORAL | Status: DC

## 2019-04-07 NOTE — Progress Notes (Signed)
Initial Nutrition Assessment  DOCUMENTATION CODES:   Not applicable  INTERVENTION:   Ensure Enlive po BID, each supplement provides 350 kcal and 20 grams of protein  MVI daily   Vitamin C 242m po BID  NUTRITION DIAGNOSIS:   Inadequate oral intake related to acute illness(Crohn's flare) as evidenced by per patient/family report.  GOAL:   Patient will meet greater than or equal to 90% of their needs  MONITOR:   PO intake, Supplement acceptance, Labs, Weight trends, Skin, I & O's  REASON FOR ASSESSMENT:   Malnutrition Screening Tool    ASSESSMENT:   73year old male patient with a known history of Crohn's disease, benign prostate hypertrophy, myelofibrosis, glaucoma, polycythemia presented to the hospital after being referred from oncology clinic for nausea vomiting and diarrhea. Pt admitted with Crohn's flare  RD working remotely.  Pt reports poor appetite and oral intake for 4-5 days pta r/t abdominal pain and nausea. Pt with improved appetite and oral intake; pt ate 80% of his breakfast this morning. RD will add supplements and vitamins to help pt meet his estimated needs. Per chart, pt has been weight stable for the past year.   Medications reviewed and include: allopurinol, D3, lovenox, ferrous sulfate, folic acid, solu-medrol, MVI, lovaza, Nabicarb, B12   Labs reviewed: Na 133(L), BUN 31(H) Wbc- 41.9(H)  Unable to complete Nutrition-Focused physical exam at this time.   Diet Order:   Diet Order            Diet regular Room service appropriate? Yes; Fluid consistency: Thin  Diet effective now             EDUCATION NEEDS:   No education needs have been identified at this time  Skin:  Skin Assessment: Reviewed RN Assessment(ecchymosis)  Last BM:  7/13  Height:   Ht Readings from Last 1 Encounters:  04/06/19 6' (1.829 m)    Weight:   Wt Readings from Last 1 Encounters:  04/06/19 83.2 kg    Ideal Body Weight:  80.9 kg  BMI:  Body mass index  is 24.89 kg/m.  Estimated Nutritional Needs:   Kcal:  2000-2300kcal/day  Protein:  100-115g/day  Fluid:  2L/day  CKoleen DistanceMS, RD, LDN Pager #- [phone]#- [phone] Hours Pager: [phone]

## 2019-04-07 NOTE — Progress Notes (Addendum)
Pt being discharged home, discharge instructions reviewed with pt, states understanding, pt with no complaints, wife will be here this afternoon to transport pt home, home med [place] returned to pt

## 2019-04-07 NOTE — Care Management Obs Status (Signed)
MEDICARE OBSERVATION STATUS NOTIFICATION   Patient Details  Name: Ruben [name] MRN: [id number] Date of Birth: January 16, 1946   Medicare Observation Status Notification Given:  Yes    Shelbie Hutching, RN 04/07/2019, 2:09 PM

## 2019-04-07 NOTE — Care Management CC44 (Signed)
Condition Code 44 Documentation Completed  Patient Details  Name: Ruben [name] MRN: [id number] Date of Birth: 09-04-1946   Condition Code 44 given:  Yes Patient signature on Condition Code 44 notice:  Yes Documentation of 2 MD's agreement:  Yes Code 44 added to claim:  Yes    Shelbie Hutching, RN 04/07/2019, 2:09 PM

## 2019-04-07 NOTE — Consult Note (Signed)
Hematology/Oncology Consult note [facility] [phone] [phone]  Patient Care Team: [name], MD as PCP - General (Family Medicine) [name], MD (Gastroenterology) Dingeldein, Remo Lipps, MD (Ophthalmology) Lequita Asal, MD as Referring Physician (Hematology and Oncology) Dasher, Rayvon Char, MD as Referring Physician (Dermatology) Anthonette Legato, MD as Consulting Physician (Internal Medicine) Hollice Espy, MD as Consulting Physician (Urology) Crissie Sickles, MD as Referring Physician (Hematology and Oncology)   Name of the patient: Ruben [name]  [id number]  1945-10-30    Reason for consult: 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 Mike Gip as an outpatient.  He is currently on Jakafi for the same.  Patient also has a history of Crohn's disease and follows up with [facility].  He was admitted from the office yesterday after he was found to have hyponatremia with a sodium of 120.  He was also complaining of abdominal pain and there was a concern for Crohn's flare and therefore admitted.  He has had poor oral intake over the last couple of days.  Patient's baseline white count is around 30  Patient reports significant improvement in his symptoms today.  Abdominal pain has nearly resolved.  He has not had a bowel movement since his hospitalization.  Denies any nausea or vomiting  ECOG PS- 1  Pain scale- 0   Review of systems- Review of Systems  Constitutional: Positive for malaise/fatigue. Negative for chills, fever and weight loss.  HENT: Negative for congestion, ear discharge and nosebleeds.   Eyes: Negative for blurred vision.  Respiratory: Negative for cough, hemoptysis, sputum production,  shortness of breath and wheezing.   Cardiovascular: Negative for chest pain, palpitations, orthopnea and claudication.  Gastrointestinal: Negative for abdominal pain, blood in stool, constipation, diarrhea, heartburn, melena, nausea and vomiting.  Genitourinary: Negative for dysuria, flank pain, frequency, hematuria and urgency.  Musculoskeletal: Negative for back pain, joint pain and myalgias.  Skin: Negative for rash.  Neurological: Negative for dizziness, tingling, focal weakness, seizures, weakness and headaches.  Endo/Heme/Allergies: Does not bruise/bleed easily.  Psychiatric/Behavioral: Negative for depression and suicidal ideas. The patient does not have insomnia.     No Known Allergies  Patient Active Problem List   Diagnosis Date Noted  . Crohn's disease of small intestine without complication ([place]) 62/13/0865  . Right-sided thoracic back pain 04/06/2019  . Hyponatremia 04/06/2019  . Iron deficiency anemia 11/26/2018  . Goals of care, counseling/discussion 01/06/2018  . 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 High triglycerides 10/14/2017  . 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 Renal insufficiency 07/23/2016  . 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 Right ankle pain 10/31/2015  . Hyperuricemia 09/05/2015  . BPH (benign prostatic hyperplasia) 08/23/2015  . History of tobacco use 08/23/2015  . Glaucoma 08/23/2015  . Enlarged prostate without lower urinary tract symptoms (luts) 08/23/2015  . Elevated prostate specific antigen (PSA) 08/23/2015  . Dermatitis 08/23/2015  . Crohn disease ([place]) 08/23/2015  . Other prurigo 08/23/2015  . Dorsalgia 08/23/2015  . Personal history of nicotine dependence 08/23/2015  . Polycythemia vera ([place]) 04/04/2015  . Secondary polycythemia 03/28/2015  . 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 Right bundle-branch block 05/03/2012     Past Medical History:  Diagnosis Date  . Blood dyscrasia   .  BPH  (benign prostatic hyperplasia)   . Cancer ([place])    SKIN/ POLYCYTHEMIA VERA  . Chronic kidney disease    RENAL INSUFF (40%)  . Crohn disease ([place])   . Crohn's disease ([place])   . Glaucoma   . Glaucoma   . Gout   . History of chicken pox   . Myelofibrosis ([place])   . Nosebleed   . RBBB   . Right bundle branch block      Past Surgical History:  Procedure Laterality Date  . CATARACT EXTRACTION W/PHACO Left 07/13/2018   Procedure: CATARACT EXTRACTION PHACO AND INTRAOCULAR LENS PLACEMENT (IOC);  Surgeon: [name], MD;  Location: [facility] ORS;  Service: Ophthalmology;  Laterality: Left;  [place] 00:39  CDE 5.14 Fluid pack lot # [id number]  . CATARACT EXTRACTION W/PHACO Right 08/03/2018   Procedure: CATARACT EXTRACTION PHACO AND INTRAOCULAR LENS PLACEMENT (IOC);  Surgeon: [name], MD;  Location: [facility] ORS;  Service: Ophthalmology;  Laterality: Right;  [place] 00:43.4 CDE 5.23 Fluid pack Lot # [id number]  . COLONOSCOPY WITH PROPOFOL N/A 03/27/2017   Procedure: COLONOSCOPY WITH PROPOFOL;  Surgeon: [name], MD;  Location[facility] ENDOSCOPY;  Service: Endoscopy;  Laterality: N/A;  . MOHS SURGERY     EAR  . PROSTATE SURGERY  2008   Prostate Biopsy in [place] to elevated PSA.  per patient normal  . Skin Lesion Basal cell removed    . wart removal     from eyelid    Social History   Socioeconomic History  . Marital status: Married    Spouse name: Not on file  . Number of children: 2  . Years of education: Not on file  . Highest education level: Bachelor's degree (e.g., BA, AB, BS)  Occupational History  . Occupation: Retired  Scientific laboratory technician  . Financial resource strain: Not hard at all  . Food insecurity    Worry: Never true    Inability: Never true  . Transportation needs    Medical: No    Non-medical: No  Tobacco Use  . Smoking status: Former Smoker    Packs/day: 0.25    Years: 20.00    Pack years: 5.00    Types: Cigarettes    Quit date: 09/28/2003    Years since  quitting: 15.5  . Smokeless tobacco: Never Used  Substance and Sexual Activity  . Alcohol use: Yes    Alcohol/week: 4.0 - 5.0 standard drinks    Types: 4 - 5 Glasses of wine per week  . Drug use: No  . Sexual activity: Not on file  Lifestyle  . Physical activity    Days per week: 0 days    Minutes per session: 0 min  . Stress: Not at all  Relationships  . Social Herbalist on phone: Patient refused    Gets together: Patient refused    Attends religious service: Patient refused    Active member of club or organization: Patient refused    Attends meetings of clubs or organizations: Patient refused    Relationship status: Patient refused  . Intimate partner violence    Fear of current or ex partner: Patient refused    Emotionally abused: Patient refused    Physically abused: Patient refused    Forced sexual activity: Patient refused  Other Topics Concern  . Not on file  Social History Narrative  . Not on file     Family History  Problem Relation Age of Onset  . Cancer Mother  Liver  . Cancer Father        Prostate  . Cancer Sister        Hodgkins lymphoma  . Cancer Paternal Aunt        Breast     Current Facility-Administered Medications:  .  0.9 %  sodium chloride infusion, , Intravenous, Continuous, Pyreddy, Pavan, MD, Last Rate: 100 mL/hr at 04/07/19 0233 .  acetaminophen (TYLENOL) tablet 650 mg, 650 mg, Oral, Q6H PRN **OR** acetaminophen (TYLENOL) suppository 650 mg, 650 mg, Rectal, Q6H PRN, Pyreddy, Pavan, MD .  allopurinol (ZYLOPRIM) tablet 300 mg, 300 mg, Oral, Daily, Pyreddy, Pavan, MD .  cholecalciferol (VITAMIN D3) tablet 1,000 Units, 1,000 Units, Oral, Q1200, Pyreddy, Pavan, MD .  enoxaparin (LOVENOX) injection 40 mg, 40 mg, Subcutaneous, Q24H, Pyreddy, Pavan, MD .  Derrill Memo ON 04/08/2019] ferrous sulfate tablet 325 mg, 325 mg, Oral, Q M,W,F, Pyreddy, Pavan, MD .  finasteride (PROSCAR) tablet 5 mg, 5 mg, Oral, QPM, Pyreddy, Pavan, MD, 5 mg at  04/06/19 1754 .  folic acid (FOLVITE) tablet 1 mg, 1 mg, Oral, Q1200, Pyreddy, Pavan, MD .  latanoprost (XALATAN) 0.005 % ophthalmic solution 1 drop, 1 drop, Both Eyes, QHS, Pyreddy, Pavan, MD, 1 drop at 04/06/19 2048 .  methylPREDNISolone sodium succinate (SOLU-MEDROL) 125 mg/2 mL injection 60 mg, 60 mg, Intravenous, Q24H, Pyreddy, Pavan, MD, 60 mg at 04/06/19 2040 .  multivitamin with minerals tablet 1 tablet, 1 tablet, Oral, Q1200, Pyreddy, Pavan, MD .  omega-3 acid ethyl esters (LOVAZA) capsule 1,000 mg, 1,000 mg, Oral, Daily, Pyreddy, Pavan, MD .  ondansetron (ZOFRAN) tablet 4 mg, 4 mg, Oral, Q6H PRN **OR** ondansetron (ZOFRAN) injection 4 mg, 4 mg, Intravenous, Q6H PRN, Pyreddy, Pavan, MD .  ruxolitinib phosphate (JAKAFI) tablet 25 mg, 25 mg, Oral, BID, Pyreddy, Pavan, MD, 25 mg at 04/06/19 2120 .  senna-docusate (Senokot-S) tablet 1 tablet, 1 tablet, Oral, QHS PRN, Pyreddy, Pavan, MD .  sodium bicarbonate tablet 650 mg, 650 mg, Oral, BID, Pyreddy, Pavan, MD, 650 mg at 04/06/19 2047 .  timolol (TIMOPTIC) 0.5 % ophthalmic solution 1 drop, 1 drop, Both Eyes, Daily, Pyreddy, Pavan, MD .  vitamin B-12 (CYANOCOBALAMIN) tablet 1,000 mcg, 1,000 mcg, Oral, Q1200, Saundra Shelling, MD   Physical exam:  Vitals:   04/06/19 1731 04/06/19 1954 04/07/19 0358  BP: (!) 160/87 (!) 147/91 (!) 146/84  Pulse: 81 75 70  Resp: 19 16 16   Temp: (!) 97.5 F (36.4 C) 98.5 F (36.9 C) 97.7 F (36.5 C)  TempSrc: Oral Oral Oral  SpO2: 100% 100% 96%  Weight: 183 lb 8 oz (83.2 kg)    Height: 6' (1.829 m)     Physical Exam HENT:     Head: Normocephalic and atraumatic.  Eyes:     Pupils: Pupils are equal, round, and reactive to light.  Neck:     Musculoskeletal: Normal range of motion.  Cardiovascular:     Rate and Rhythm: Normal rate and regular rhythm.     Heart sounds: Normal heart sounds.  Pulmonary:     Effort: Pulmonary effort is normal.     Breath sounds: Normal breath sounds.  Abdominal:      General: Bowel sounds are normal. There is no distension.     Palpations: Abdomen is soft.     Tenderness: There is no abdominal tenderness.  Skin:    General: Skin is warm and dry.  Neurological:     Mental Status: He is alert and oriented to person, place, and  time.        CMP Latest Ref Rng & Units 04/07/2019  Glucose 70 - 99 mg/dL 117(H)  BUN 8 - 23 mg/dL 31(H)  Creatinine 0.61 - 1.24 mg/dL 1.18  Sodium 135 - 145 mmol/L 133(L)  Potassium 3.5 - 5.1 mmol/L 4.2  Chloride 98 - 111 mmol/L 105  CO2 22 - 32 mmol/L 19(L)  Calcium 8.9 - 10.3 mg/dL 8.5(L)  Total Protein 6.5 - 8.1 g/dL -  Total Bilirubin 0.3 - 1.2 mg/dL -  Alkaline Phos 38 - 126 U/L -  AST 15 - 41 U/L -  ALT 0 - 44 U/L -   CBC Latest Ref Rng & Units 04/07/2019  WBC 4.0 - 10.5 K/uL 41.9(H)  Hemoglobin 13.0 - 17.0 g/dL 11.5(L)  Hematocrit 39.0 - 52.0 % 36.4(L)  Platelets 150 - 400 K/uL 150    @IMAGES @  Ct Abdomen Pelvis W Contrast  Result Date: 04/03/2019 CLINICAL DATA:  Pt to ED via POV c/o lower abdominal pain since Friday. Pt states that the pain has continued to get worse. Pt states that he vomited x 1 on Friday but none since. Pt denies diarrhea but states that his stools are soft. Pt denies fever. Hx Crohn's EXAM: CT ABDOMEN AND PELVIS WITH CONTRAST TECHNIQUE: Multidetector CT imaging of the abdomen and pelvis was performed using the standard protocol following bolus administration of intravenous contrast. CONTRAST:  129m OMNIPAQUE IOHEXOL 300 MG/ML  SOLN COMPARISON:  CT, 08/24/2012 FINDINGS: Lower chest: Clear lung bases.  Heart normal in size. Hepatobiliary: No focal liver abnormality is seen. No gallstones, gallbladder wall thickening, or biliary dilatation. Pancreas: Unremarkable. No pancreatic ductal dilatation or surrounding inflammatory changes. Spleen: Enlarged spleen measuring 16.7 x 7.7 x 15.6 cm, increased in size from the prior CT. No splenic mass or focal lesion. Adrenals/Urinary Tract: No adrenal  masses. Kidneys normal in position and orientation and show symmetric enhancement and excretion. There is right greater than left renal cortical thinning. There several small low-density renal lesions, too small to fully characterize, likely cysts. A definitive cyst is noted in the midpole the right kidney measuring 1.4 cm. No other renal masses. No stones. No hydronephrosis. Normal ureters. Bladder is only mildly distended. Wall appears mildly thickened. No bladder mass or stone. Stomach/Bowel: Normal stomach. In the pelvis, there are short segments mild small bowel wall thickening with intervening mildly dilated segments showing air-fluid levels. No other small bowel wall thickening. No mesenteric inflammation. Colon is normal in caliber. No colonic wall thickening or adjacent appendix not defined. No evidence of appendicitis. Inflammation. Vascular/Lymphatic: Prominent gastrohepatic ligament lymph nodes, largest 9 mm in short axis. No other adenopathy. Aortic atherosclerosis. No aneurysm. Reproductive: Prostate is mildly enlarged with somewhat ill-defined margins. It bulges against the bladder base. It measures 4.8 x 4.5 x 4.2 cm. Other: No abdominal wall hernia or abnormality. No abdominopelvic ascites. Musculoskeletal: No fracture or acute finding. No osteoblastic or osteolytic lesions. Moderate disc degenerative changes at L5-S1. IMPRESSION: 1. Mild wall thickening of portions of a loop of small bowel within the pelvis with intervening areas of mildly dilated small bowel. This is consistent with active Crohn's disease. No evidence of an abscess or fistula. No other evidence of bowel inflammation. 2. Splenomegaly, which has developed since the prior CT. The etiology of this is unclear. 3. Mild bladder wall thickening. There is enlargement of the prostate. Suspect chronic bladder outlet obstruction as the etiology of bladder wall thickening. 4. Aortic atherosclerosis. 5. Right greater than left renal  cortical  thinning. Electronically Signed   By: Lajean Manes M.D.   On: 04/03/2019 11:48    Assessment and plan- Patient is a 73 y.o. 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  Myelofibrosis: Continue Jakafi as an inpatient, acute on chronic leukocytosis possibly secondary to steroid use.  Continue to monitor  2.  Crohn's exacerbation: Symptomatically better after IV steroids have been started.  GI following  3.  Hyponatremia: Sodium is improved from 1 20-1 33.  Continue to monitor   Thank you for this kind referral and the opportunity to participate in the care of this patient   Visit Diagnosis Myelofibrosis Hyponatremia  Dr. Randa Evens, MD, MPH [facility] at [facility] [phone] 04/07/2019 2:51 PM

## 2019-04-07 NOTE — Consult Note (Signed)
[facility] GI Inpatient Consult Note   Kathline Magic, M.D.  Reason for Consult: Abdominal pain, leukocytosis, Crohn's flare   Attending Requesting Consult: Saundra Shelling, M.D.  Outpatient Primary Physician: Lelon Huh, M.D.  History of Present Illness: Ruben [name] is a 73 y.o. male with hx of Crohn's disease and PRV presenting for several days of progressive abdominal pain, diarrhea and one episode of black emesis. He was started on empiric prednisone po which did not succeed in improving his condition. Patient was admitted yesterday out of Dr. Mike Gip office (oncology) due to progressive pain and leukocytosis.  Patient has been taking Jakafi for PRV for the past several months.  CT scan of the abdomen and pelvis revealed dilated loops of small bowel with intermittent episodes of inflammation consistent with Crohn's disease.  There was also some splenomegaly present without any evidence of splenic mass.  Patient reports today that he is feeling much better after IV steroids and rehydration.  He has no abdominal pain and has not had a bowel movement since hospital admission.  There is been no further episodes of vomiting or significant nausea.  He is tolerating he regular diet at this time.  Past Medical History:  Past Medical History:  Diagnosis Date  . Blood dyscrasia   . BPH (benign prostatic hyperplasia)   . Cancer ([place])    SKIN/ POLYCYTHEMIA VERA  . Chronic kidney disease    RENAL INSUFF (40%)  . Crohn disease ([place])   . Crohn's disease ([place])   . Glaucoma   . Glaucoma   . Gout   . History of chicken pox   . Myelofibrosis ([place])   . Nosebleed   . RBBB   . Right bundle branch block     Problem List: Patient Active Problem List   Diagnosis Date Noted  . Crohn's disease of small intestine without complication ([place]) 10/93/2355  . Right-sided thoracic back pain 04/06/2019  . Hyponatremia 04/06/2019  . Iron deficiency anemia 11/26/2018  . Goals of care,  counseling/discussion 01/06/2018  . 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 High triglycerides 10/14/2017  . 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 Renal insufficiency 07/23/2016  . 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 Right ankle pain 10/31/2015  . Hyperuricemia 09/05/2015  . BPH (benign prostatic hyperplasia) 08/23/2015  . History of tobacco use 08/23/2015  . Glaucoma 08/23/2015  . Enlarged prostate without lower urinary tract symptoms (luts) 08/23/2015  . Elevated prostate specific antigen (PSA) 08/23/2015  . Dermatitis 08/23/2015  . Crohn disease ([place]) 08/23/2015  . Other prurigo 08/23/2015  . Dorsalgia 08/23/2015  . Personal history of nicotine dependence 08/23/2015  . 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 Secondary polycythemia 03/28/2015  . 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 Right bundle-branch block 05/03/2012    Past Surgical History: Past Surgical History:  Procedure Laterality Date  . CATARACT EXTRACTION W/PHACO Left 07/13/2018   Procedure: CATARACT EXTRACTION PHACO AND INTRAOCULAR LENS PLACEMENT (IOC);  Surgeon: [name], MD;  Location: [facility] ORS;  Service: Ophthalmology;  Laterality: Left;  [place] 00:39  CDE 5.14 Fluid pack lot # [id number]  . CATARACT EXTRACTION W/PHACO Right 08/03/2018   Procedure: CATARACT EXTRACTION PHACO AND INTRAOCULAR LENS PLACEMENT (IOC);  Surgeon: [name], MD;  Location: [facility] ORS;  Service: Ophthalmology;  Laterality: Right;  [place] 00:43.4 CDE 5.23 Fluid pack Lot # [id number]  . COLONOSCOPY WITH PROPOFOL N/A 03/27/2017   Procedure: COLONOSCOPY WITH PROPOFOL;  Surgeon: [name], MD;  Location: [facility] ENDOSCOPY;  Service: Endoscopy;  Laterality: N/A;  . MOHS SURGERY     EAR  . PROSTATE SURGERY  2008   Prostate Biopsy in [place] to elevated PSA.  per patient normal  . Skin Lesion Basal cell removed    . wart removal     from eyelid    Allergies: No Known Allergies  Home  Medications: Medications Prior to Admission  Medication Sig Dispense Refill Last Dose  . acetaminophen (TYLENOL) 500 MG tablet Take 500 mg by mouth daily as needed for moderate pain.   Past Week at Unknown time  . allopurinol (ZYLOPRIM) 300 MG tablet Take 1 tablet (300 mg total) by mouth daily. 90 tablet 0 04/06/2019 at Unknown time  . amoxicillin-clavulanate (AUGMENTIN) 875-125 MG tablet Take 1 tablet by mouth 2 (two) times daily for 7 days. 14 tablet 0 04/06/2019 at Unknown time  . Cholecalciferol (VITAMIN D3) 1000 units CAPS Take 1,000 Units by mouth daily at 12 noon.    04/06/2019 at Unknown time  . Cyanocobalamin (RA VITAMIN B-12 TR) 1000 MCG TBCR Take 1,000 mcg by mouth daily at 12 noon.    04/06/2019 at Unknown time  . ferrous sulfate 325 (65 FE) MG tablet Take 325 mg by mouth every Monday, Wednesday, and Friday.    04/06/2019 at Unknown time  . finasteride (PROSCAR) 5 MG tablet Take 1 tablet (5 mg total) by mouth every evening. 90 tablet 3 04/05/2019 at Unknown time  . folic acid (FOLVITE) 1 MG tablet Take 1 mg by mouth daily at 12 noon.    04/06/2019 at Unknown time  . JAKAFI 25 MG tablet TAKE 1 TABLET (25 MG TOTAL) BY MOUTH 2 TIMES DAILY. 60 tablet 5 04/06/2019 at Unknown time  . latanoprost (XALATAN) 0.005 % ophthalmic solution Place 1 drop into both eyes at bedtime.    04/05/2019 at Unknown time  . Multiple Vitamins-Minerals (CENTRUM SILVER ULTRA MENS) TABS Take 1 tablet by mouth daily at 12 noon.    04/06/2019 at Unknown time  . Omega-3 Fatty Acids (KP FISH OIL) 1200 MG CAPS Take 1,200 mg by mouth daily.    04/06/2019 at Unknown time  . ondansetron (ZOFRAN ODT) 4 MG disintegrating tablet Take 1 tablet (4 mg total) by mouth every 8 (eight) hours as needed for nausea or vomiting. 12 tablet 0 Past Week at Unknown time  . predniSONE (DELTASONE) 20 MG tablet Take 2 tablets (40 mg total) by mouth daily for 7 days. 14 tablet 0 04/06/2019 at Unknown time  . sodium bicarbonate 650 MG tablet Take 650 mg by  mouth 2 (two) times daily.   04/06/2019 at Unknown time  . timolol (TIMOPTIC) 0.5 % ophthalmic solution Place 1 drop into both eyes daily. In AM  5 04/06/2019 at Unknown time   Home medication reconciliation was completed with the patient.   Scheduled Inpatient Medications:   . allopurinol  300 mg Oral Daily  . cholecalciferol  1,000 Units Oral Q1200  . enoxaparin (LOVENOX) injection  40 mg Subcutaneous Q24H  . [START ON 04/08/2019] ferrous sulfate  325 mg Oral Q M,W,F  . finasteride  5 mg Oral QPM  . folic acid  1 mg Oral [id number]  . latanoprost  1 drop Both Eyes QHS  . methylPREDNISolone (SOLU-MEDROL) injection  60 mg Intravenous Q24H  . multivitamin with minerals  1 tablet Oral Q1200  . omega-3 acid ethyl esters  1,000 mg Oral Daily  . ruxolitinib phosphate  25 mg Oral BID  .  sodium bicarbonate  650 mg Oral BID  . timolol  1 drop Both Eyes Daily  . vitamin B-12  1,000 mcg Oral Q1200    Continuous Inpatient Infusions:   . sodium chloride 100 mL/hr at 04/07/19 0233    PRN Inpatient Medications:  acetaminophen **OR** acetaminophen, ondansetron **OR** ondansetron (ZOFRAN) IV, senna-docusate  Family History: family history includes Cancer in his father, mother, paternal aunt, and sister.   GI Family History: Negative  Social History:   reports that he quit smoking about 15 years ago. His smoking use included cigarettes. He has a 5.00 pack-year smoking history. He has never used smokeless tobacco. He reports current alcohol use of about 4.0 - 5.0 standard drinks of alcohol per week. He reports that he does not use drugs. The patient denies ETOH, tobacco, or drug use.    Review of Systems: Review of Systems - Negative except HPI  Physical Examination: BP (!) 146/84 (BP Location: Left Arm)   Pulse 70   Temp 97.7 F (36.5 C) (Oral)   Resp 16   Ht 6' (1.829 m)   Wt 83.2 kg   SpO2 96%   BMI 24.89 kg/m  Physical Exam Constitutional:      Appearance: Normal appearance.  HENT:      Head: Normocephalic and atraumatic.     Nose: Nose normal.  Eyes:     Extraocular Movements: Extraocular movements intact.     Pupils: Pupils are equal, round, and reactive to light.  Cardiovascular:     Rate and Rhythm: Normal rate.     Heart sounds: No murmur. No gallop.   Pulmonary:     Effort: Pulmonary effort is normal.     Breath sounds: Normal breath sounds.  Abdominal:     General: Abdomen is flat. There is no distension.     Palpations: Abdomen is soft.     Tenderness: There is no abdominal tenderness.  Musculoskeletal: Normal range of motion.  Neurological:     General: No focal deficit present.     Mental Status: He is alert.  Psychiatric:        Mood and Affect: Mood normal.        Thought Content: Thought content normal.     Data: Lab Results  Component Value Date   WBC 41.9 (H) 04/07/2019   HGB 11.5 (L) 04/07/2019   HCT 36.4 (L) 04/07/2019   MCV 85.4 04/07/2019   PLT 150 04/07/2019   Recent Labs  Lab 04/05/19 1451 04/06/19 0844 04/07/19 0359  HGB 12.5* 12.3* 11.5*   Lab Results  Component Value Date   NA 133 (L) 04/07/2019   K 4.2 04/07/2019   CL 105 04/07/2019   CO2 19 (L) 04/07/2019   BUN 31 (H) 04/07/2019   CREATININE 1.18 04/07/2019   GLU 81 04/10/2014   Lab Results  Component Value Date   ALT 21 04/06/2019   AST 49 (H) 04/06/2019   ALKPHOS 79 04/06/2019   BILITOT 1.0 04/06/2019   No results for input(s): APTT, INR, PTT in the last 168 hours. CBC Latest Ref Rng & Units 04/07/2019 04/06/2019 04/05/2019  WBC 4.0 - 10.5 K/uL 41.9(H) 45.1(H) 48.7(H)  Hemoglobin 13.0 - 17.0 g/dL 11.5(L) 12.3(L) 12.5(L)  Hematocrit 39.0 - 52.0 % 36.4(L) 36.5(L) 37.5(L)  Platelets 150 - 400 K/uL 150 166 169    STUDIES: No results found. @IMAGES @  Assessment:  1. Crohn's flare -appears much improved today on IV steroids and IV rehydration.  2. Polycythemia [place] -  Recently on Jakafi.  3. Leukocytosis - Increasing WBC - appears more related  to hematological malignancy, maybe steroids. Will monitor.   COVID-19 status:: Negative  Recommendations:  1. Final recommendations to follow progress of clinical status. I agree with IV steroids for now.  2.  Continue diet.   3.  We will follow along.  Await oncology recommendations regarding leukocytosis and splenomegaly in the setting of treatment with Jakafi.  Thank you for the consult. Please call with questions or concerns.  Olean Ree, "Lanny Hurst MD [facility] [place], [place] [address] [phone]  04/07/2019 7:57 AM

## 2019-04-08 ENCOUNTER — Telehealth: Payer: Self-pay | Admitting: *Deleted

## 2019-04-08 ENCOUNTER — Telehealth: Payer: Self-pay

## 2019-04-08 NOTE — Telephone Encounter (Signed)
Patient called reporting he was discharged from hospital yesterday and he is in pain today. Please return his call [phone]

## 2019-04-08 NOTE — Telephone Encounter (Signed)
Talked to wife, [name], regarding patient's pain level. Wife reports patient no longer is complaining of pain. States she feels his pain was due to him have a "regular diet" during hospital stay last night. Denies any other complaints or concerns.

## 2019-04-08 NOTE — Telephone Encounter (Signed)
VM left requesting call back.

## 2019-04-13 ENCOUNTER — Ambulatory Visit: Payer: Medicare Other | Admitting: Urology

## 2019-04-14 DIAGNOSIS — N289 Disorder of kidney and ureter, unspecified: Secondary | ICD-10-CM | POA: Diagnosis not present

## 2019-04-14 DIAGNOSIS — K5000 Crohn's disease of small intestine without complications: Secondary | ICD-10-CM | POA: Diagnosis not present

## 2019-04-14 DIAGNOSIS — D72828 Other elevated white blood cell count: Secondary | ICD-10-CM | POA: Diagnosis not present

## 2019-04-14 DIAGNOSIS — R740 Nonspecific elevation of levels of transaminase and lactic acid dehydrogenase [LDH]: Secondary | ICD-10-CM | POA: Diagnosis not present

## 2019-04-15 ENCOUNTER — Telehealth: Payer: Self-pay

## 2019-04-15 ENCOUNTER — Telehealth: Payer: Self-pay | Admitting: Hematology and Oncology

## 2019-04-15 ENCOUNTER — Telehealth: Payer: Self-pay | Admitting: *Deleted

## 2019-04-15 ENCOUNTER — Inpatient Hospital Stay: Payer: Medicare Other

## 2019-04-15 ENCOUNTER — Other Ambulatory Visit: Payer: Self-pay

## 2019-04-15 DIAGNOSIS — R338 Other retention of urine: Secondary | ICD-10-CM | POA: Diagnosis not present

## 2019-04-15 DIAGNOSIS — I4510 Unspecified right bundle-branch block: Secondary | ICD-10-CM | POA: Diagnosis not present

## 2019-04-15 DIAGNOSIS — E860 Dehydration: Secondary | ICD-10-CM | POA: Diagnosis not present

## 2019-04-15 DIAGNOSIS — D45 Polycythemia vera: Secondary | ICD-10-CM | POA: Diagnosis not present

## 2019-04-15 DIAGNOSIS — N189 Chronic kidney disease, unspecified: Secondary | ICD-10-CM | POA: Diagnosis not present

## 2019-04-15 DIAGNOSIS — M545 Low back pain: Secondary | ICD-10-CM | POA: Diagnosis not present

## 2019-04-15 DIAGNOSIS — Z87891 Personal history of nicotine dependence: Secondary | ICD-10-CM | POA: Diagnosis not present

## 2019-04-15 DIAGNOSIS — D72829 Elevated white blood cell count, unspecified: Secondary | ICD-10-CM

## 2019-04-15 DIAGNOSIS — R062 Wheezing: Secondary | ICD-10-CM | POA: Diagnosis not present

## 2019-04-15 DIAGNOSIS — Z8719 Personal history of other diseases of the digestive system: Secondary | ICD-10-CM | POA: Diagnosis not present

## 2019-04-15 DIAGNOSIS — N401 Enlarged prostate with lower urinary tract symptoms: Secondary | ICD-10-CM | POA: Diagnosis not present

## 2019-04-15 DIAGNOSIS — Z79899 Other long term (current) drug therapy: Secondary | ICD-10-CM | POA: Diagnosis not present

## 2019-04-15 DIAGNOSIS — D7581 Myelofibrosis: Secondary | ICD-10-CM | POA: Diagnosis not present

## 2019-04-15 DIAGNOSIS — E871 Hypo-osmolality and hyponatremia: Secondary | ICD-10-CM | POA: Diagnosis not present

## 2019-04-15 DIAGNOSIS — R634 Abnormal weight loss: Secondary | ICD-10-CM | POA: Diagnosis not present

## 2019-04-15 DIAGNOSIS — M549 Dorsalgia, unspecified: Secondary | ICD-10-CM | POA: Diagnosis not present

## 2019-04-15 DIAGNOSIS — R05 Cough: Secondary | ICD-10-CM | POA: Diagnosis not present

## 2019-04-15 DIAGNOSIS — K5090 Crohn's disease, unspecified, without complications: Secondary | ICD-10-CM | POA: Diagnosis not present

## 2019-04-15 LAB — URIC ACID: Uric Acid, Serum: 4.6 mg/dL (ref 3.7–8.6)

## 2019-04-15 LAB — CBC WITH DIFFERENTIAL/PLATELET
Abs Immature Granulocytes: 4 10*3/uL — ABNORMAL HIGH (ref 0.00–0.07)
Band Neutrophils: 4 %
Basophils Absolute: 0.8 10*3/uL — ABNORMAL HIGH (ref 0.0–0.1)
Basophils Relative: 1 %
Blasts: 4 %
Eosinophils Absolute: 0 10*3/uL (ref 0.0–0.5)
Eosinophils Relative: 0 %
HCT: 37.3 % — ABNORMAL LOW (ref 39.0–52.0)
Hemoglobin: 11.8 g/dL — ABNORMAL LOW (ref 13.0–17.0)
Lymphocytes Relative: 13 %
Lymphs Abs: 10.4 10*3/uL — ABNORMAL HIGH (ref 0.7–4.0)
MCH: 27.5 pg (ref 26.0–34.0)
MCHC: 31.6 g/dL (ref 30.0–36.0)
MCV: 86.9 fL (ref 80.0–100.0)
Metamyelocytes Relative: 3 %
Monocytes Absolute: 9.6 10*3/uL — ABNORMAL HIGH (ref 0.1–1.0)
Monocytes Relative: 12 %
Myelocytes: 2 %
Neutro Abs: 52.2 10*3/uL — ABNORMAL HIGH (ref 1.7–7.7)
Neutrophils Relative %: 61 %
Platelets: 239 10*3/uL (ref 150–400)
RBC: 4.29 MIL/uL (ref 4.22–5.81)
RDW: 20.6 % — ABNORMAL HIGH (ref 11.5–15.5)
Smear Review: NORMAL
WBC: 80.3 10*3/uL (ref 4.0–10.5)
nRBC: 0.3 % — ABNORMAL HIGH (ref 0.0–0.2)

## 2019-04-15 LAB — LACTATE DEHYDROGENASE: LDH: 635 U/L — ABNORMAL HIGH (ref 98–192)

## 2019-04-15 LAB — PATHOLOGIST SMEAR REVIEW

## 2019-04-15 NOTE — Telephone Encounter (Signed)
Received call for a critical WBC = 80.27  From Juliann Pulse in the main [place] [facility]. Dr Mike Gip notified. Plans made with Lab in [place] to call courier ( will pick up slide with in 30 mins) to take to [facility] lab for smear readings by pathologist. Dr Mike Gip spoke to Dr Betsy Pries who has been notified in [facility] to stay and review smear/slide. She was given Dr Drenda Freeze cell number for results.

## 2019-04-15 NOTE — Telephone Encounter (Signed)
Re:  Peripheral smear review  There is a significant leukocytosis, comprised predominantly of mature neutrophils. There are scattered metamyelocytes and myelocytes.  A rare circulating blast is noted (1%). There is no increase of basophils or eosinophils.  Red cells demonstrate unremarkable morphology, with only a rare nucleated form.  Platelets are adequate in number, with unremarkable morphology.  The findings are consistent with a myeloproliferative neoplasm.  Spoke to patient's wife.  Elevated WBC felt secondary to patient's underlying myeloproliferative disorder + steroids.  GI has patient on prednisone 40 mg a day.  Plan to decrease to 20 mg on Monday, 04/18/2019.  Uric acid normal.  LDH improving.   Lequita Asal, MD

## 2019-04-15 NOTE — Telephone Encounter (Signed)
Spoke with GI clinic Scientist, water quality) at 2:00 pm to inform that they ccould not do the labs in a timely manner and get the results back.

## 2019-04-15 NOTE — Telephone Encounter (Signed)
Called to inform Ruben Swindell that Dr Mike Gip has already spoken with Sharyn Lull NP at  the GI  office this AM and they are getting a plan together for Ruben [name]. Some one should be getting touch with her soon to discuss a plan of care. The patient was understanding and agreeable and has agreed to wait on either party to return a call.

## 2019-04-19 NOTE — Progress Notes (Signed)
[facility]  [address] [place], [place] [address] Phone: [phone]  Fax: [phone]   Clinic Day:  04/21/2019  Referring physician: [name], MD  Chief Complaint: Ruben [name] is a 73 y.o. 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  HPI: The patient was last seen in the hematology clinic on 04/06/2019. At that time, he had an active Crohn's flare associated with nausea, vomiting, diarrhea, and abdominal pain.  He had black emesis x 1.  He was eating and drinking little (only water).  Diarrhea appeared to have improved.  Pain was episodic.  He had sweats and chills.  He was on prednisone and Augmentin.  He had lost 20 pounds since last visit (3 months).  Abdominal exam was benign.  WBC was 45,100 (ANC 28,000).  Sodium was 120.  He was admitted to [facility] from 04/06/2019 - 04/07/2019.  He had a chroin's flare,and hyponatremia secondary to dehydration. Patient had abdominal discomfort nausea and vomiting and diarrhea for the last couple of days.Had generalized weakness.Appeared dry and dehydrated.No blood in the stool.No chest pain and shortness of breath. He was given IV fluid hydration and he continued Jakafi.   Labs followed:  04/07/2019: Hematocrit 36.4, hemoglobin 11.5, platelets 150,000, WBC 41,900.  04/15/2019: Hematocrit 37.3, Hemoglobin 11.8, platelets 239,000, WBC 80,300 (ANC 52,200). LDH 635 (improving). Uric acid 4.6.   Path review showed significant leukocytosis, comprised predominantly of mature neutrophils. Scattered metamyelocytes and myelocytes. There was a rare circulating blast (1%). No increase of basophils or eosinophils. Red cells demonstrated unremarkable morphology, with only a rare nucleated form. Platelets were adequate in number, with unremarkable morphology. The findings were consistent with a myeloproliferative neoplasm.  The patients prednisone was decreased from 40 mg to 20 mg on  Monday, 04/18/2019.   During the interim, the patient is doing good. His abdominal pain is improving. He notes his right foot feels "funny" sometimes. 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 His notes constipation for x 2 days. His Crohn's flare up has improved.   He notes a decrease in appetite. He has been put on a diet. He is not eating any sweets; he is not over eating (getting second helpings). He has been drinking water and Gatorade.    Past Medical History:  Diagnosis Date  . Blood dyscrasia   . BPH (benign prostatic hyperplasia)   . Cancer ([place])    SKIN/ POLYCYTHEMIA VERA  . Chronic kidney disease    RENAL INSUFF (40%)  . Crohn disease ([place])   . Crohn's disease ([place])   . Glaucoma   . Glaucoma   . Gout   . History of chicken pox   . Myelofibrosis ([place])   . Nosebleed   . RBBB   . Right bundle branch block     Past Surgical History:  Procedure Laterality Date  . CATARACT EXTRACTION W/PHACO Left 07/13/2018   Procedure: CATARACT EXTRACTION PHACO AND INTRAOCULAR LENS PLACEMENT (IOC);  Surgeon: Ruben Robson, MD;  Location: [facility] ORS;  Service: Ophthalmology;  Laterality: Left;  [place] 00:39  CDE 5.14 Fluid pack lot # [id number]  . CATARACT EXTRACTION W/PHACO Right 08/03/2018   Procedure: CATARACT EXTRACTION PHACO AND INTRAOCULAR LENS PLACEMENT (IOC);  Surgeon: Ruben Robson, MD;  Location: [facility] ORS;  Service: Ophthalmology;  Laterality: Right;  [place] 00:43.4 CDE 5.23 Fluid pack Lot # [id number]  . COLONOSCOPY WITH PROPOFOL N/A 03/27/2017   Procedure: COLONOSCOPY WITH PROPOFOL;  Surgeon: Vira Agar,  Ruben Pound, MD;  Location: [facility] ENDOSCOPY;  Service: Endoscopy;  Laterality: N/A;  . MOHS SURGERY     EAR  . PROSTATE SURGERY  2008   Prostate Biopsy in [place] to elevated PSA.  per patient normal  . Skin Lesion Basal cell removed    . wart removal     from eyelid    Family History  Problem Relation Age of Onset  . Cancer Mother        Liver  . Cancer Father         Prostate  . Cancer Sister        Hodgkins lymphoma  . Cancer Paternal Aunt        Breast    Social History:  reports that he quit smoking about 15 years ago. His smoking use included cigarettes. He has a 5.00 pack-year smoking history. He has never used smokeless tobacco. He reports current alcohol use of about 4.0 - 5.0 standard drinks of alcohol per week. He reports that he does not use drugs. He previously worked for [employer]. He no longer volunteers for [employer]. The patient's wife isJudith. The patient is alone today.  Allergies: No Known Allergies  Current Medications: Current Outpatient Medications  Medication Sig Dispense Refill  . acetaminophen (TYLENOL) 500 MG tablet Take 500 mg by mouth daily as needed for moderate pain.    Marland Kitchen allopurinol (ZYLOPRIM) 300 MG tablet Take 1 tablet (300 mg total) by mouth daily. 90 tablet 0  . ferrous sulfate 325 (65 FE) MG tablet Take 325 mg by mouth every Monday, Wednesday, and Friday.     Marland Kitchen JAKAFI 25 MG tablet TAKE 1 TABLET (25 MG TOTAL) BY MOUTH 2 TIMES DAILY. 60 tablet 5  . latanoprost (XALATAN) 0.005 % ophthalmic solution Place 1 drop into both eyes at bedtime.     . predniSONE (DELTASONE) 20 MG tablet Take 3 tablets (60 mg total) by mouth daily for 15 days. 3 tabs for 5 days and then 2 tabs for 5 days and then 1 tab for 5 days 30 tablet 0  . sodium bicarbonate 650 MG tablet Take 650 mg by mouth 2 (two) times daily.    . timolol (TIMOPTIC) 0.5 % ophthalmic solution Place 1 drop into both eyes daily. In AM  5  . Cholecalciferol (VITAMIN D3) 1000 units CAPS Take 1,000 Units by mouth daily at 12 noon.     . Cyanocobalamin (RA VITAMIN B-12 TR) 1000 MCG TBCR Take 1,000 mcg by mouth daily at 12 noon.     . finasteride (PROSCAR) 5 MG tablet Take 1 tablet (5 mg total) by mouth every evening. (Patient not taking: Reported on 04/21/2019) 90 tablet 3  . folic acid (FOLVITE) 1 MG tablet Take 1 mg by mouth daily at 12 noon.     . Multiple  Vitamins-Minerals (CENTRUM SILVER ULTRA MENS) TABS Take 1 tablet by mouth daily at 12 noon.     . Omega-3 Fatty Acids (KP FISH OIL) 1200 MG CAPS Take 1,200 mg by mouth daily.     . ondansetron (ZOFRAN ODT) 4 MG disintegrating tablet Take 1 tablet (4 mg total) by mouth every 8 (eight) hours as needed for nausea or vomiting. (Patient not taking: Reported on 04/21/2019) 12 tablet 0   No current facility-administered medications for this visit.     Review of Systems  Constitutional: Negative for chills, diaphoresis, fever (resolved), malaise/fatigue and weight loss (up 1 lb).       Doing  well.  HENT: Negative.  Negative for congestion, hearing loss, nosebleeds, sinus pain and sore throat.   Eyes: Negative for blurred vision, double vision, photophobia, pain and discharge.       S/p cataract surgery.  Uses reading glasses.  Respiratory: Negative.  Negative for cough, hemoptysis, sputum production and shortness of breath.   Cardiovascular: Negative.  Negative for chest pain, palpitations, orthopnea, leg swelling and PND.  Gastrointestinal: Positive for constipation (no bowel movement in 2 days). Negative for abdominal pain (Crohn's flare up, improving), blood in stool, diarrhea (resolved), heartburn, melena, nausea and vomiting.  Genitourinary: Negative for dysuria, frequency, hematuria and urgency.       Urinary catheterization (self) 4x/day.  Postponed prostate surgery.  Musculoskeletal: Negative for back pain, falls, joint pain, myalgias and neck pain.  Skin: Negative.  Negative for itching and rash.  Neurological: Negative for dizziness, tingling, tremors, sensory change, speech change, focal weakness, weakness (generalized) and headaches.  Endo/Heme/Allergies: Negative.  Does not bruise/bleed easily.  Psychiatric/Behavioral: Positive for memory loss (mild). Negative for depression and substance abuse. The patient has insomnia (difficulty sleeping). The patient is not nervous/anxious.   All other  systems reviewed and are negative.  Performance status (ECOG): 2  Vitals Blood pressure 124/72, pulse 77, temperature (!) 97.3 F (36.3 C), temperature source Tympanic, resp. rate 16, weight 160 lb 2.6 oz (72.7 kg), SpO2 100 %.   Physical Exam  Constitutional: He is oriented to person, place, and time. He appears well-developed and well-nourished.  Well appearing sitting in the exam room in no acute distress.  He appears back to his baseline health.  HENT:  Head: Normocephalic and atraumatic.  Mouth/Throat: Oropharynx is clear and moist. No oropharyngeal exudate.  Short gray hair.  Mask.  Eyes: Pupils are equal, round, and reactive to light. Conjunctivae and EOM are normal. No scleral icterus.  Glasses.  Blue eyes.  Neck: Normal range of motion. Neck supple. No JVD present.  Cardiovascular: Normal rate, regular rhythm and normal heart sounds. Exam reveals no gallop.  No murmur heard. Pulmonary/Chest: Effort normal and breath sounds normal. No respiratory distress. He has no wheezes. He has no rales.  Abdominal: Soft. Bowel sounds are normal. He exhibits no distension and no mass. There is no splenomegaly. There is no abdominal tenderness. There is no rebound and no guarding.  Musculoskeletal: Normal range of motion.        General: No edema.  Lymphadenopathy:    He has no cervical adenopathy.    He has no axillary adenopathy.       Right: No supraclavicular adenopathy present.       Left: No supraclavicular adenopathy present.  Neurological: He is alert and oriented to person, place, and time.  Skin: Skin is warm and dry. No rash noted. He is not diaphoretic. No erythema. No pallor.  Psychiatric: He has a normal mood and affect. His behavior is normal. Judgment and thought content normal.  Nursing note and vitals reviewed.   No visits with results within 3 Day(s) from this visit.  Latest known visit with results is:  Appointment on 04/15/2019  Component Date Value Ref Range  Status  . Path Review 04/15/2019 Peripheral blood smear is reviewed.   Final   Comment: There is a significant leukocytosis, comprised predominantly of mature neutrophils.  There are scattered metamyelocytes and myelocytes. A rare circulating blast is noted (1%).  There is no increase of basophils or eosinophils. Red cells demonstrate unremarkable morphology, with only a rare  nucleated form.  Platelets are adequate in number, with unremarkable morphology. The findings are consistent with a myeloproliferative neoplasm. Reviewed by Elmon Kirschner, M.D. Performed at [facility], [address], [place], [place] [address]   . Uric Acid, Serum 04/15/2019 4.6  3.7 - 8.6 mg/dL Final   Performed at [facility], [address] [place], [place] [address]  . LDH 04/15/2019 635* 98 - 192 U/L Final   Performed at [facility], [address], [place], [place] [address]  . WBC 04/15/2019 80.3* 4.0 - 10.5 K/uL Final   This critical result has verified and been called to [facility] by Volney Presser on 07 24 2020 at 1500, and has been read back. NOTIFIED AT 1430  . RBC 04/15/2019 4.29  4.22 - 5.81 MIL/uL Final  . Hemoglobin 04/15/2019 11.8* 13.0 - 17.0 g/dL Final  . HCT 04/15/2019 37.3* 39.0 - 52.0 % Final  . MCV 04/15/2019 86.9  80.0 - 100.0 fL Final  . MCH 04/15/2019 27.5  26.0 - 34.0 pg Final  . MCHC 04/15/2019 31.6  30.0 - 36.0 g/dL Final  . RDW 04/15/2019 20.6* 11.5 - 15.5 % Final  . Platelets 04/15/2019 239  150 - 400 K/uL Final  . nRBC 04/15/2019 0.3* 0.0 - 0.2 % Final  . Neutrophils Relative % 04/15/2019 61  % Final  . Neutro Abs 04/15/2019 52.2* 1.7 - 7.7 K/uL Final  . Band Neutrophils 04/15/2019 4  % Final  . Lymphocytes Relative 04/15/2019 13  % Final  . Lymphs Abs 04/15/2019 10.4* 0.7 - 4.0 K/uL Final  . Monocytes Relative 04/15/2019 12  % Final  . Monocytes Absolute 04/15/2019 9.6* 0.1 - 1.0 K/uL Final  . Eosinophils Relative 04/15/2019 0  %  Final  . Eosinophils Absolute 04/15/2019 0.0  0.0 - 0.5 K/uL Final  . Basophils Relative 04/15/2019 1  % Final  . Basophils Absolute 04/15/2019 0.8* 0.0 - 0.1 K/uL Final  . RBC Morphology 04/15/2019 MIXED RBC POPULATION   Final  . Smear Review 04/15/2019 Normal platelet morphology   Final  . Metamyelocytes Relative 04/15/2019 3  % Final  . Myelocytes 04/15/2019 2  % Final  . Blasts 04/15/2019 4  % Final  . Abs Immature Granulocytes 04/15/2019 4.00* 0.00 - 0.07 K/uL Final  . Polychromasia 04/15/2019 PRESENT   Final   Performed at [facility], [address] [place], [place] [address]    Assessment:  Ruben [name] is a 73 y.o. 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 He has had polycythemia dating back to 2013. Hematocrit was 62.1 with a hemoglobin of 19.8 on 03/28/2015. 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 Erythropoietin level was 1.1 (low). 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  He began a phlebotomyprogram on 03/28/2015 to maintain a hematocrit goal of <45. Last phlebotomy was on 05/02/2015. He is on a baby aspirin.  CBC on 11/28/2015 revealed a hematocrit of 39.3, hemoglobin 11.9, platelets 463,000, WBC 32,000 with an ANC of 25,000. Differential included 71% segs, 4% lymphocytes and 17% monocytes. Peripheral smear revealed leukocytosis with predominantly mature neutrophils, increased monocytes and rare blasts (<1%).  Bone marrowon 12/14/2015 revealed a persistent myeloproliferative neoplasm with myelofibrosis and alterations compatible with myelodysplatic progression. Marrow was packed (95-100% cellularity) with pan myelosis, multi-lineage dyspoiesis, and no significant increase in blasts. There was moderate to focally marked reticulin fibrosis (grade 2-3/3). Storage iron was not identified. Flow cytometry revealed non-specific atypical myeloid findings with no increase in  blasts. 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 Cytogenetics were normal (46, XY).  [facility]on 04/07/2016 [facility] revealed a hypercellular marrow (>95%) with persistent involvement by myeloid neoplasm with 5% blasts. There was mild reticulin fibrosis. FISH t(9;22) results were normal. 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 Cytogenetics are pending.  Bone marrowon 08/09/2018 revealed a hypercellular bone marrow (>95%) with persistent involvement by myeloid neoplasm with <1% blasts by manual touch preparation differential. There was marked reticulin fibrosis with focal collagen deposition. Cytogenetics were normal (46, XY). - Myeloid mutation panel study is pending.  He was briefly on hydroxyurea500 mg a day (12/05/2015 - 12/19/2015 and 06/25/2016 - 07/03/2016). Platelet count increased to 1.18 million and white count to 54,800 without increased blasts on 06/25/2016. CBC on 07/02/2016 revealed a platelets of 429,000 with a white count 30,600. He began allopurinolon 12/19/2015. He began Jakafi 20 mg BID on 05/28/2016. He has been onJakafi25 mg BID since09/12/2016.  LDHis followed: 466 on 01/02/2016, 596 on 05/23/2016, 663 on 07/30/2016, 522 on 09/24/2016, 638 on 01/05/2017, 625 on 02/25/2017, 1144 on 06/10/2017, 720 on 06/17/2017, 728 on 06/24/2017, and 724 on 01/06/2018. Triglycerideswere 183 (<150) on 08/20/2016, 180 on 01/05/2017, 312 on 06/17/2017, 165 on 11/25/2017, 113 on 02/17/2018.  He has urinary retention. Renal ultrasoundon 07/28/2016 revealed bilateral hydronephrosis (right >left) and a large postvoid residual (2 liters) suggesting bladder dysfunction or outlet obstruction. He underwent temporary Foley catheter placement. He performs I/O self catheterizations. He is on Flomax and finasteride. PSAwas 2.02 on 07/09/2016.  He is followed by GI (Dr. Vira Agar) for a history of  polypsand Crohn's disease. He had an unremarkable colonoscopy in 03/2017. Abdomen and pelvis CT on 04/03/2019 revealed mild wall thickening of portions of a loop of small bowel within the pelvis with intervening areas of mildly dilated small bowel, consistent with active Crohn's disease. No evidence of an abscess or fistula. No other evidence of bowel inflammation. There was splenomegaly (16.7 x 7.7 x 15.6 cm), developed since 08/24/2012. There was mild bladder wall thickening and enlargement of the prostate. Chronic bladder outlet obstruction was felt to be the etiology of bladder wall thickening.  He has chronicrenal insufficiency(Cr 1.85; CrCl 36 ml/min). SPEP on 09/23/2016 was negative. Spot urine revealed 17.4% of 27.3 mg/dL.of a monoclonal protein. 24 hour urine on 10/16/2016 revealed no monoclonal protein. Renal function transiently decreased in 06/2017 after Bactrim. He is followed by nephrology and urology.  He has a history of epistaxis. Normal studies included: PT, PTT, von Willebrand panel, and platelet function assay (PFA).  Symptomatically, he appears back to his baseline health.  He denies any diarrhea.  Not had a bowel movement in 2 days.  Exam is unremarkable.  Plan: 1.   Labs today: CBC with diff. 2.   Polycythemia rubra vera             WBC 72,900. LDH 635 (improved from 1`082 on 04/06/2019).             Peripheral smear from 04/15/2019 revealed no increased blasts.             Elevated WBC secondary to underlying PV, steroids and Crohn's flare.   Suspect improvement in WBC with tapering of steroids 3.   Iron deficiency             Hematocrit 37.3.  Hemoglobin 11.9.  MCV 87.8.   He is on oral iron 3  days/week (Mondays, Wednesdays and Fridays).             Ferritin was 163 on 04/06/2019.             Ferritin may be falsely elevated secondary to Crohn's flare.             Check ferritin at next lab draw. 4.   Crohn's disease             Patient followed by GI  [facility].  Steroid taper per GI. 5.   RTC in 2 weeks for CBC with diff. 6.   RTC in 6 weeks for MD assessment and labs (CBC with diff, CMP, LDH).  I discussed the assessment and treatment plan with the patient.  The patient was provided an opportunity to ask questions and all were answered.  The patient agreed with the plan and demonstrated an understanding of the instructions.  The patient was advised to call back if the symptoms worsen or if the condition fails to improve as anticipated.   Lequita Asal, MD, PhD    04/21/2019, 10:07 AM  I, Selena Batten, am acting as scribe for [employer]. Mike Gip, MD, PhD.  I, Melissa C. Mike Gip, MD, have reviewed the above documentation for accuracy and completeness, and I agree with the above.

## 2019-04-20 NOTE — Discharge Summary (Signed)
[facility] NAME: Scotland Korver    MR#:  [id number]  DATE OF BIRTH:  1946-02-19  DATE OF ADMISSION:  04/06/2019 ADMITTING PHYSICIAN: Max Sane, MD  DATE OF DISCHARGE: 04/07/2019  4:37 PM  PRIMARY CARE PHYSICIAN: [name], MD   ADMISSION DIAGNOSIS:  chroins flar hyponatremia dheydration  DISCHARGE DIAGNOSIS:  Active Problems:   Hyponatremia   SECONDARY DIAGNOSIS:   Past Medical History:  Diagnosis Date  . Blood dyscrasia   . BPH (benign prostatic hyperplasia)   . Cancer ([place])    SKIN/ POLYCYTHEMIA VERA  . Chronic kidney disease    RENAL INSUFF (40%)  . Crohn disease ([place])   . Crohn's disease ([place])   . Glaucoma   . Glaucoma   . Gout   . History of chicken pox   . Myelofibrosis ([place])   . Nosebleed   . RBBB   . Right bundle branch block      ADMITTING HISTORY  HISTORY OF PRESENT ILLNESS: Nicklos Gaxiola  is a 73 y.o. male with a known history of Crohn's disease, benign prostate hypertrophy, myelofibrosis, glaucoma, polycythemia presented to the hospital after being referred from oncology clinic.  Patient has abdominal discomfort nausea and vomiting and diarrhea for the last couple of days.  Has generalized weakness.  Appears dry and dehydrated.  Hospitalist service was consulted for further care.  No blood in the stool.  No chest pain and shortness of breath.  HOSPITAL COURSE:   73 year old male patient with a known history of Crohn's disease, benign prostate hypertrophy, myelofibrosis, glaucoma, polycythemia presented to the hospital after being referred from oncology clinic for nausea vomiting and diarrhea  -Acute Crohn's disease exacerbation IV fluid hydration Gastroenterology consulted.  With improvement in symptoms Dr. Alice Reichert who saw the patient advised switching to oral steroids at discharge and follow-up as outpatient.  Prescription given. Patient did not have any nausea/vomiting/abdominal pain or  diarrhea at time of discharge.  -Dehydration IV fluids.  Resolved  -Acute hyponatremia Secondary to dehydration and Crohn's exacerbation Started on IV fluids and sodium close to normal by time of discharge  -History of myelofibrosis Continue Jkafi.  Seen by heme-onc in the hospital  Stable for discharge home.  CONSULTS OBTAINED:  Treatment Team:  Sindy Guadeloupe, MD  DRUG ALLERGIES:  No Known Allergies  DISCHARGE MEDICATIONS:   Allergies as of 04/07/2019   No Known Allergies     Medication List    TAKE these medications   acetaminophen 500 MG tablet Commonly known as: TYLENOL Take 500 mg by mouth daily as needed for moderate pain.   allopurinol 300 MG tablet Commonly known as: ZYLOPRIM Take 1 tablet (300 mg total) by mouth daily.   Centrum Silver Ultra Mens Tabs Take 1 tablet by mouth daily at 12 noon.   ferrous sulfate 325 (65 FE) MG tablet Take 325 mg by mouth every Monday, Wednesday, and Friday.   finasteride 5 MG tablet Commonly known as: PROSCAR Take 1 tablet (5 mg total) by mouth every evening.   folic acid 1 MG tablet Commonly known as: FOLVITE Take 1 mg by mouth daily at 12 noon.   Jakafi 25 MG tablet Generic drug: ruxolitinib phosphate TAKE 1 TABLET (25 MG TOTAL) BY MOUTH 2 TIMES DAILY.   KP Fish Oil 1200 MG Caps Take 1,200 mg by mouth daily.   latanoprost 0.005 % ophthalmic solution Commonly known as: XALATAN Place 1 drop into both eyes at bedtime.  ondansetron 4 MG disintegrating tablet Commonly known as: Zofran ODT Take 1 tablet (4 mg total) by mouth every 8 (eight) hours as needed for nausea or vomiting.   predniSONE 20 MG tablet Commonly known as: DELTASONE Take 3 tablets (60 mg total) by mouth daily for 15 days. 3 tabs for 5 days and then 2 tabs for 5 days and then 1 tab for 5 days What changed:   how much to take  additional instructions   RA Vitamin B-12 TR 1000 MCG Tbcr Generic drug: Cyanocobalamin Take 1,000 mcg by  mouth daily at 12 noon.   sodium bicarbonate 650 MG tablet Take 650 mg by mouth 2 (two) times daily.   timolol 0.5 % ophthalmic solution Commonly known as: TIMOPTIC Place 1 drop into both eyes daily. In AM   Vitamin D3 25 MCG (1000 UT) Caps Take 1,000 Units by mouth daily at 12 noon.     ASK your doctor about these medications   amoxicillin-clavulanate 875-125 MG tablet Commonly known as: Augmentin Take 1 tablet by mouth 2 (two) times daily for 7 days. Ask about: Should I take this medication?       Today   VITAL SIGNS:  Blood pressure (!) 146/84, pulse 70, temperature 97.7 F (36.5 C), temperature source Oral, resp. rate 16, height 6' (1.829 m), weight 83.2 kg, SpO2 96 %.  I/O:  No intake or output data in the 24 hours ending 04/20/19 1509  PHYSICAL EXAMINATION:  Physical Exam  GENERAL:  73 y.o.-year-old patient lying in the bed with no acute distress.  LUNGS: Normal breath sounds bilaterally, no wheezing, rales,rhonchi or crepitation. No use of accessory muscles of respiration.  CARDIOVASCULAR: S1, S2 normal. No murmurs, rubs, or gallops.  ABDOMEN: Soft, non-tender, non-distended. Bowel sounds present. No organomegaly or mass.  NEUROLOGIC: Moves all 4 extremities. PSYCHIATRIC: The patient is alert and oriented x 3.  SKIN: No obvious rash, lesion, or ulcer.   DATA REVIEW:   CBC Recent Labs  Lab 04/15/19 1408  WBC 80.3*  HGB 11.8*  HCT 37.3*  PLT 239    Chemistries  No results for input(s): NA, K, CL, CO2, GLUCOSE, BUN, CREATININE, CALCIUM, MG, AST, ALT, ALKPHOS, BILITOT in the last 168 hours.  Invalid input(s): GFRCGP  Cardiac Enzymes No results for input(s): TROPONINI in the last 168 hours.  Microbiology Results  Results for orders placed or performed during the hospital encounter of 04/06/19  SARS Coronavirus 2 (CEPHEID - Performed in [place] hospital lab), Hosp Order     Status: None   Collection Time: 04/06/19  5:58 PM   Specimen:  Nasopharyngeal Swab  Result Value Ref Range Status   SARS Coronavirus 2 NEGATIVE NEGATIVE Final    Comment: (NOTE) If result is NEGATIVE SARS-CoV-2 target nucleic acids are NOT DETECTED. The SARS-CoV-2 RNA is generally detectable in upper and lower  respiratory specimens during the acute phase of infection. The lowest  concentration of SARS-CoV-2 viral copies this assay can detect is 250  copies / mL. A negative result does not preclude SARS-CoV-2 infection  and should not be used as the sole basis for treatment or other  patient management decisions.  A negative result may occur with  improper specimen collection / handling, submission of specimen other  than nasopharyngeal swab, presence of viral mutation(s) within the  areas targeted by this assay, and inadequate number of viral copies  (<250 copies / mL). A negative result must be combined with clinical  observations, patient history,  and epidemiological information. If result is POSITIVE SARS-CoV-2 target nucleic acids are DETECTED. The SARS-CoV-2 RNA is generally detectable in upper and lower  respiratory specimens dur ing the acute phase of infection.  Positive  results are indicative of active infection with SARS-CoV-2.  Clinical  correlation with patient history and other diagnostic information is  necessary to determine patient infection status.  Positive results do  not rule out bacterial infection or co-infection with other viruses. If result is PRESUMPTIVE POSTIVE SARS-CoV-2 nucleic acids MAY BE PRESENT.   A presumptive positive result was obtained on the submitted specimen  and confirmed on repeat testing.  While 2019 novel coronavirus  (SARS-CoV-2) nucleic acids may be present in the submitted sample  additional confirmatory testing may be necessary for epidemiological  and / or clinical management purposes  to differentiate between  SARS-CoV-2 and other Sarbecovirus currently known to infect humans.  If clinically  indicated additional testing with an alternate test  methodology [id number]) is advised. The SARS-CoV-2 RNA is generally  detectable in upper and lower respiratory sp ecimens during the acute  phase of infection. The expected result is Negative. Fact Sheet for Patients:  StrictlyIdeas.no Fact Sheet for Healthcare Providers: BankingDealers.co.za This test is not yet approved or cleared by the [place] FDA and has been authorized for detection and/or diagnosis of SARS-CoV-2 by FDA under an Emergency Use Authorization (EUA).  This EUA will remain in effect (meaning this test can be used) for the duration of the COVID-19 declaration under Section 564(b)(1) of the Act, 21 U.S.C. section 360bbb-3(b)(1), unless the authorization is terminated or revoked sooner. Performed at [facility], [address], [place],  [address]     RADIOLOGY:  No results found.  Follow up with PCP in 1 week.  Management plans discussed with the patient, family and they are in agreement.  CODE STATUS:  Code Status History    Date Active Date Inactive Code Status Order ID Comments User Context   04/06/2019 1744 04/07/2019 1942 Full Code [id number]  Saundra Shelling, MD Inpatient   Advance Care Planning Activity    Advance Directive Documentation     Most Recent Value  Type of Advance Directive  Healthcare Power of Attorney, Living will  Pre-existing out of facility DNR order (yellow form or pink MOST form)  -  "MOST" Form in Place?  -      TOTAL TIME TAKING CARE OF THIS PATIENT ON DAY OF DISCHARGE: more than 30 minutes.   Leia Alf Quintara Bost M.D on 04/20/2019 at 3:09 PM  Between 7am to 6pm - Pager - [phone]  After 6pm go to [email or web address] - password [email or web address] [facility]  [facility]  Office  [phone]  CC: Primary care physician; [name], MD  Note: This dictation was prepared with Dragon dictation along with smaller  phrase technology. Any transcriptional errors that result from this process are unintentional.

## 2019-04-21 ENCOUNTER — Other Ambulatory Visit: Payer: Self-pay

## 2019-04-21 ENCOUNTER — Encounter: Payer: Self-pay | Admitting: Hematology and Oncology

## 2019-04-21 ENCOUNTER — Inpatient Hospital Stay (HOSPITAL_BASED_OUTPATIENT_CLINIC_OR_DEPARTMENT_OTHER): Payer: Medicare Other | Admitting: Hematology and Oncology

## 2019-04-21 ENCOUNTER — Telehealth: Payer: Self-pay

## 2019-04-21 VITALS — BP 124/72 | HR 77 | Temp 97.3°F | Resp 16 | Wt 160.2 lb

## 2019-04-21 DIAGNOSIS — R062 Wheezing: Secondary | ICD-10-CM

## 2019-04-21 DIAGNOSIS — R05 Cough: Secondary | ICD-10-CM

## 2019-04-21 DIAGNOSIS — Z79899 Other long term (current) drug therapy: Secondary | ICD-10-CM

## 2019-04-21 DIAGNOSIS — D45 Polycythemia vera: Secondary | ICD-10-CM | POA: Diagnosis not present

## 2019-04-21 DIAGNOSIS — K5090 Crohn's disease, unspecified, without complications: Secondary | ICD-10-CM | POA: Diagnosis not present

## 2019-04-21 DIAGNOSIS — E860 Dehydration: Secondary | ICD-10-CM

## 2019-04-21 DIAGNOSIS — Z8719 Personal history of other diseases of the digestive system: Secondary | ICD-10-CM

## 2019-04-21 DIAGNOSIS — K50919 Crohn's disease, unspecified, with unspecified complications: Secondary | ICD-10-CM

## 2019-04-21 DIAGNOSIS — M545 Low back pain: Secondary | ICD-10-CM | POA: Diagnosis not present

## 2019-04-21 DIAGNOSIS — E871 Hypo-osmolality and hyponatremia: Secondary | ICD-10-CM

## 2019-04-21 DIAGNOSIS — N401 Enlarged prostate with lower urinary tract symptoms: Secondary | ICD-10-CM | POA: Diagnosis not present

## 2019-04-21 DIAGNOSIS — D500 Iron deficiency anemia secondary to blood loss (chronic): Secondary | ICD-10-CM

## 2019-04-21 DIAGNOSIS — R338 Other retention of urine: Secondary | ICD-10-CM | POA: Diagnosis not present

## 2019-04-21 DIAGNOSIS — Z87891 Personal history of nicotine dependence: Secondary | ICD-10-CM

## 2019-04-21 DIAGNOSIS — M549 Dorsalgia, unspecified: Secondary | ICD-10-CM

## 2019-04-21 DIAGNOSIS — N189 Chronic kidney disease, unspecified: Secondary | ICD-10-CM

## 2019-04-21 DIAGNOSIS — R634 Abnormal weight loss: Secondary | ICD-10-CM

## 2019-04-21 DIAGNOSIS — D7581 Myelofibrosis: Secondary | ICD-10-CM

## 2019-04-21 DIAGNOSIS — I4510 Unspecified right bundle-branch block: Secondary | ICD-10-CM

## 2019-04-21 LAB — CBC WITH DIFFERENTIAL/PLATELET
Abs Immature Granulocytes: 6.6 10*3/uL — ABNORMAL HIGH (ref 0.00–0.07)
Band Neutrophils: 17 %
Basophils Absolute: 0 10*3/uL (ref 0.0–0.1)
Basophils Relative: 0 %
Blasts: 1 %
Eosinophils Absolute: 1.5 10*3/uL — ABNORMAL HIGH (ref 0.0–0.5)
Eosinophils Relative: 2 %
HCT: 37.3 % — ABNORMAL LOW (ref 39.0–52.0)
Hemoglobin: 11.9 g/dL — ABNORMAL LOW (ref 13.0–17.0)
Lymphocytes Relative: 11 %
Lymphs Abs: 8 10*3/uL — ABNORMAL HIGH (ref 0.7–4.0)
MCH: 28 pg (ref 26.0–34.0)
MCHC: 31.9 g/dL (ref 30.0–36.0)
MCV: 87.8 fL (ref 80.0–100.0)
Metamyelocytes Relative: 5 %
Monocytes Absolute: 2.2 10*3/uL — ABNORMAL HIGH (ref 0.1–1.0)
Monocytes Relative: 3 %
Myelocytes: 4 %
Neutro Abs: 53.9 10*3/uL — ABNORMAL HIGH (ref 1.7–7.7)
Neutrophils Relative %: 57 %
Platelets: 173 10*3/uL (ref 150–400)
RBC: 4.25 MIL/uL (ref 4.22–5.81)
RDW: 21.1 % — ABNORMAL HIGH (ref 11.5–15.5)
WBC: 72.9 10*3/uL (ref 4.0–10.5)
nRBC: 0.3 % — ABNORMAL HIGH (ref 0.0–0.2)

## 2019-04-21 LAB — BASIC METABOLIC PANEL
Anion gap: 7 (ref 5–15)
BUN: 37 mg/dL — ABNORMAL HIGH (ref 8–23)
CO2: 24 mmol/L (ref 22–32)
Calcium: 8.9 mg/dL (ref 8.9–10.3)
Chloride: 102 mmol/L (ref 98–111)
Creatinine, Ser: 1.51 mg/dL — ABNORMAL HIGH (ref 0.61–1.24)
GFR calc Af Amer: 53 mL/min — ABNORMAL LOW (ref >60)
GFR calc non Af Amer: 45 mL/min — ABNORMAL LOW (ref >60)
Glucose, Bld: 92 mg/dL (ref 70–99)
Potassium: 3.6 mmol/L (ref 3.5–5.1)
Sodium: 133 mmol/L — ABNORMAL LOW (ref 135–145)

## 2019-04-21 MED FILL — JAKAFI 25 MG TABLET: 25 | 30 days supply | Qty: 60 | Fill #5

## 2019-04-21 NOTE — Telephone Encounter (Signed)
Received call from the lab ( cathy ) which she reports Mr Lill WBC 72.9 which has decrease from 80.3. Labs was reported to Dr Mike Gip.

## 2019-04-21 NOTE — Progress Notes (Signed)
Pt here for follow up. Patient denies any concerns.

## 2019-04-22 ENCOUNTER — Telehealth: Payer: Self-pay | Admitting: Pharmacy Technician

## 2019-04-22 NOTE — Telephone Encounter (Signed)
Oral Oncology Patient Advocate Encounter   Was successful in securing patient an $36 grant from Patient [facility]) to provide copayment coverage for Jakafi.  This will keep the out of pocket expense at $0.     I have spoken with the patient.    The billing information is as follows and has been shared with [place].   Member ID: [id number] Group ID: [id number] RxBin: [id number] Dates of Eligibility: 05/23/2019 through 05/21/2020  [place] [facility] [place] Phone [phone] Fax [phone] 04/22/2019 9:40 AM

## 2019-05-05 ENCOUNTER — Telehealth: Payer: Self-pay

## 2019-05-05 ENCOUNTER — Inpatient Hospital Stay: Payer: Medicare Other | Attending: Hematology and Oncology

## 2019-05-05 ENCOUNTER — Other Ambulatory Visit: Payer: Self-pay

## 2019-05-05 DIAGNOSIS — D45 Polycythemia vera: Secondary | ICD-10-CM | POA: Insufficient documentation

## 2019-05-05 DIAGNOSIS — Z23 Encounter for immunization: Secondary | ICD-10-CM | POA: Diagnosis not present

## 2019-05-05 LAB — CBC WITH DIFFERENTIAL/PLATELET
Abs Immature Granulocytes: 1.3 10*3/uL — ABNORMAL HIGH (ref 0.00–0.07)
Band Neutrophils: 4 %
Basophils Absolute: 0.2 10*3/uL — ABNORMAL HIGH (ref 0.0–0.1)
Basophils Relative: 1 %
Eosinophils Absolute: 0 10*3/uL (ref 0.0–0.5)
Eosinophils Relative: 0 %
HCT: 31.6 % — ABNORMAL LOW (ref 39.0–52.0)
Hemoglobin: 10.2 g/dL — ABNORMAL LOW (ref 13.0–17.0)
Lymphocytes Relative: 12 %
Lymphs Abs: 2.6 10*3/uL (ref 0.7–4.0)
MCH: 27.6 pg (ref 26.0–34.0)
MCHC: 32.3 g/dL (ref 30.0–36.0)
MCV: 85.4 fL (ref 80.0–100.0)
Metamyelocytes Relative: 4 %
Monocytes Absolute: 1.5 10*3/uL — ABNORMAL HIGH (ref 0.1–1.0)
Monocytes Relative: 7 %
Myelocytes: 2 %
Neutro Abs: 16.3 10*3/uL — ABNORMAL HIGH (ref 1.7–7.7)
Neutrophils Relative %: 70 %
Platelets: 187 10*3/uL (ref 150–400)
RBC: 3.7 MIL/uL — ABNORMAL LOW (ref 4.22–5.81)
RDW: 20.9 % — ABNORMAL HIGH (ref 11.5–15.5)
Smear Review: ADEQUATE
WBC: 22 10*3/uL — ABNORMAL HIGH (ref 4.0–10.5)
nRBC: 0.9 % — ABNORMAL HIGH (ref 0.0–0.2)

## 2019-05-05 NOTE — Telephone Encounter (Signed)
Left message requesting callback from patient. This is regarding WBC and HGB. Also to find out if patient is taking oral iron 3 days/week.

## 2019-05-05 NOTE — Telephone Encounter (Signed)
Inbound call from patient returning call. Patient informed of WBC and HGB results. Questioned patient with HGB decrease if he noticed any bleeding. Patient denies any s/s. Patient is questioning if he is eligible to receive Shingles vaccine with current medications. Informed patient I would discuss with Dr. Mike Gip and send a my chart message to inform him. Patient verbalizes understanding and denies any further questions or concerns.

## 2019-05-05 NOTE — Telephone Encounter (Signed)
-----   Message from Lequita Asal, MD sent at 05/05/2019 11:56 AM EDT ----- Regarding: Please call patient  Please let him know about his counts.  WBC is better.  Hemoglobin is down.  Any bleeding?  Is he on his oral iron 3 days/week?  M ----- Message ----- From: Interface, Lab In [place] Sent: 05/05/2019  11:12 AM EDT To: Lequita Asal, MD

## 2019-05-05 NOTE — Telephone Encounter (Signed)
-----   Message from Lequita Asal, MD sent at 05/05/2019 11:56 AM EDT ----- Regarding: Please call patient  Please let him know about his counts.  WBC is better.  Hemoglobin is down.  Any bleeding?  Is he on his oral iron 3 days/week?  M ----- Message ----- From: Interface, Lab In [place] Sent: 05/05/2019  11:12 AM EDT To: Lequita Asal, MD

## 2019-05-10 DIAGNOSIS — M21371 Foot drop, right foot: Secondary | ICD-10-CM | POA: Diagnosis not present

## 2019-05-10 DIAGNOSIS — M79674 Pain in right toe(s): Secondary | ICD-10-CM | POA: Diagnosis not present

## 2019-05-10 DIAGNOSIS — B351 Tinea unguium: Secondary | ICD-10-CM | POA: Diagnosis not present

## 2019-05-19 DIAGNOSIS — E872 Acidosis: Secondary | ICD-10-CM | POA: Diagnosis not present

## 2019-05-19 DIAGNOSIS — N2581 Secondary hyperparathyroidism of renal origin: Secondary | ICD-10-CM | POA: Diagnosis not present

## 2019-05-19 DIAGNOSIS — R809 Proteinuria, unspecified: Secondary | ICD-10-CM | POA: Diagnosis not present

## 2019-05-19 DIAGNOSIS — N183 Chronic kidney disease, stage 3 (moderate): Secondary | ICD-10-CM | POA: Diagnosis not present

## 2019-05-19 DIAGNOSIS — N1330 Unspecified hydronephrosis: Secondary | ICD-10-CM | POA: Diagnosis not present

## 2019-05-23 ENCOUNTER — Other Ambulatory Visit: Payer: Self-pay | Admitting: *Deleted

## 2019-05-23 DIAGNOSIS — D45 Polycythemia vera: Secondary | ICD-10-CM

## 2019-05-24 MED FILL — JAKAFI 25 MG TABLET: 25 | 30 days supply | Qty: 60 | Fill #0

## 2019-05-25 DIAGNOSIS — E531 Pyridoxine deficiency: Secondary | ICD-10-CM | POA: Diagnosis not present

## 2019-05-25 DIAGNOSIS — D45 Polycythemia vera: Secondary | ICD-10-CM | POA: Diagnosis not present

## 2019-05-25 DIAGNOSIS — K5090 Crohn's disease, unspecified, without complications: Secondary | ICD-10-CM | POA: Diagnosis not present

## 2019-05-25 DIAGNOSIS — E538 Deficiency of other specified B group vitamins: Secondary | ICD-10-CM | POA: Diagnosis not present

## 2019-05-25 DIAGNOSIS — M21371 Foot drop, right foot: Secondary | ICD-10-CM | POA: Diagnosis not present

## 2019-05-25 DIAGNOSIS — E559 Vitamin D deficiency, unspecified: Secondary | ICD-10-CM | POA: Diagnosis not present

## 2019-05-25 DIAGNOSIS — R531 Weakness: Secondary | ICD-10-CM | POA: Diagnosis not present

## 2019-05-26 ENCOUNTER — Other Ambulatory Visit: Payer: Self-pay | Admitting: Neurology

## 2019-05-26 DIAGNOSIS — R531 Weakness: Secondary | ICD-10-CM

## 2019-05-26 DIAGNOSIS — M21371 Foot drop, right foot: Secondary | ICD-10-CM

## 2019-05-31 NOTE — Progress Notes (Signed)
[facility]  [address] [place], [place] [address] Phone: [phone]  Fax: [phone]   Clinic Day:  06/02/2019  Referring physician: [name], MD  Chief Complaint: Ruben [name] is a 73 y.o. 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  HPI: The patient was last seen in the hematology clinic on 04/21/2019. At that time, he appeared back to his baseline health. He denied any diarrhea. He had no bowel movement in 2 days. Exam was unremarkable. Hematocrit 37.3, hemoglobin 11.9, MCV 87.58, platelets 173,000, WBC 72,900 (ANC 53,900).   Recent peripheral smear revealed no increased blasts.  His elevated WBC was secondary to underlying PV, steroids and Crohn's flare.  He continued oral iron.  Labs followed: 05/05/2019: Hematocrit 31.6, hemoglobin 10.2, MCV 85.4, platelets 187,000, WBC 22,000 (ANC 16,300). 05/19/2019: Hematocrit 31.8, hemoglobin 10.4, MCV 85.5, platelets 254,000, WBC 35,400 (ANC 27,931).   During the interim, the patient feels "good". His BP is 101/63 (a little low) in the clinic today. He denies dizziness and light headedness. He is eating well, his GI tract has improved. He reports taking his iron pill 3 days a week. He notes normal bowl movements. His insomnia has improved.  He ran out of his allopurinol.  He reports losing mobility in his right foot which he attributes to his last flare up. He is not able to flex his right foot in the upward direction. He has right foot drop.  He notes balance and coordination are "ok". He denies any headaches or visual changes. He is being seen by a neurologist. He has upcoming appointments for labs and multiple scans at [facility].    Past Medical History:  Diagnosis Date  . Blood dyscrasia   . BPH (benign prostatic hyperplasia)   . Cancer ([place])    SKIN/ POLYCYTHEMIA VERA  . Chronic kidney disease    RENAL INSUFF (40%)  . Crohn disease ([place])   . Crohn's disease  ([place])   . Glaucoma   . Glaucoma   . Gout   . History of chicken pox   . Myelofibrosis ([place])   . Nosebleed   . RBBB   . Right bundle branch block     Past Surgical History:  Procedure Laterality Date  . CATARACT EXTRACTION W/PHACO Left 07/13/2018   Procedure: CATARACT EXTRACTION PHACO AND INTRAOCULAR LENS PLACEMENT (IOC);  Surgeon: Ruben Robson, MD;  Location: [facility] ORS;  Service: Ophthalmology;  Laterality: Left;  [place] 00:39  CDE 5.14 Fluid pack lot # [id number]  . CATARACT EXTRACTION W/PHACO Right 08/03/2018   Procedure: CATARACT EXTRACTION PHACO AND INTRAOCULAR LENS PLACEMENT (IOC);  Surgeon: Ruben Robson, MD;  Location: [facility] ORS;  Service: Ophthalmology;  Laterality: Right;  [place] 00:43.4 CDE 5.23 Fluid pack Lot # [id number]  . COLONOSCOPY WITH PROPOFOL N/A 03/27/2017   Procedure: COLONOSCOPY WITH PROPOFOL;  Surgeon: Ruben Silvas, MD;  Location: [facility] ENDOSCOPY;  Service: Endoscopy;  Laterality: N/A;  . MOHS SURGERY     EAR  . PROSTATE SURGERY  2008   Prostate Biopsy in [place] to elevated PSA.  per patient normal  . Skin Lesion Basal cell removed    . wart removal     from eyelid    Family History  Problem Relation Age of Onset  . Cancer Mother        Liver  . Cancer Father        Prostate  . Cancer Sister  Hodgkins lymphoma  . Cancer Paternal Aunt        Breast    Social History:  reports that he quit smoking about 15 years ago. His smoking use included cigarettes. He has a 5.00 pack-year smoking history. He has never used smokeless tobacco. He reports current alcohol use of about 4.0 - 5.0 standard drinks of alcohol per week. He reports that he does not use drugs. He previously worked for [employer]. He no longer volunteers for [employer]. The patient's wife isJudith.  He lives in [place].  The patient is alone today.  Allergies: No Known Allergies  Current Medications: Current Outpatient Medications  Medication Sig Dispense Refill   . acetaminophen (TYLENOL) 500 MG tablet Take 500 mg by mouth daily as needed for moderate pain.    Marland Kitchen allopurinol (ZYLOPRIM) 300 MG tablet Take 1 tablet (300 mg total) by mouth daily. 90 tablet 0  . Cholecalciferol (VITAMIN D3) 1000 units CAPS Take 1,000 Units by mouth daily at 12 noon.     . Cyanocobalamin (RA VITAMIN B-12 TR) 1000 MCG TBCR Take 1,000 mcg by mouth daily at 12 noon.     . ferrous sulfate 325 (65 FE) MG tablet Take 325 mg by mouth every Monday, Wednesday, and Friday.     . finasteride (PROSCAR) 5 MG tablet Take 1 tablet (5 mg total) by mouth every evening. 90 tablet 3  . folic acid (FOLVITE) 1 MG tablet Take 1 mg by mouth daily at 12 noon.     Marland Kitchen JAKAFI 25 MG tablet TAKE 1 TABLET (25 MG TOTAL) BY MOUTH 2 TIMES DAILY. 60 tablet 5  . latanoprost (XALATAN) 0.005 % ophthalmic solution Place 1 drop into both eyes at bedtime.     . Multiple Vitamins-Minerals (CENTRUM SILVER ULTRA MENS) TABS Take 1 tablet by mouth daily at 12 noon.     . Omega-3 Fatty Acids (KP FISH OIL) 1200 MG CAPS Take 1,200 mg by mouth daily.     . sodium bicarbonate 650 MG tablet Take 650 mg by mouth 2 (two) times daily.    . timolol (TIMOPTIC) 0.5 % ophthalmic solution Place 1 drop into both eyes daily. In AM  5  . ondansetron (ZOFRAN ODT) 4 MG disintegrating tablet Take 1 tablet (4 mg total) by mouth every 8 (eight) hours as needed for nausea or vomiting. (Patient not taking: Reported on 04/21/2019) 12 tablet 0   No current facility-administered medications for this visit.     Review of Systems  Constitutional: Negative for chills, diaphoresis, fever, malaise/fatigue and weight loss (up 4 lb).       Feels "good".  HENT: Negative.  Negative for congestion, hearing loss, nosebleeds, sinus pain and sore throat.   Eyes: Negative for blurred vision, double vision, photophobia, pain and discharge.       S/p cataract surgery.  Uses reading glasses.  Respiratory: Negative.  Negative for cough, hemoptysis, sputum  production and shortness of breath.   Cardiovascular: Negative.  Negative for chest pain, palpitations, orthopnea, leg swelling and PND.  Gastrointestinal: Negative for abdominal pain (Crohn's flare, resolved), blood in stool, constipation, diarrhea, heartburn, melena, nausea and vomiting.       Eating well. GI symptoms resolved- normal bowels.  Genitourinary: Negative for dysuria, frequency, hematuria and urgency.       Urinary catheterization (self) 4x/day.  Postponed prostate surgery.  Musculoskeletal: Negative.  Negative for back pain, falls, joint pain, myalgias and neck pain.  Skin: Negative.  Negative for itching  and rash.  Neurological: Negative for dizziness, tingling, tremors, sensory change, speech change, focal weakness, weakness and headaches.       Right foot drop.  Balance and coordination are "ok".  Endo/Heme/Allergies: Negative.  Does not bruise/bleed easily.  Psychiatric/Behavioral: Positive for memory loss (mild). Negative for depression and substance abuse. The patient is not nervous/anxious and does not have insomnia (difficulty sleeping, improved).   All other systems reviewed and are negative.  Performance status (ECOG):  1  Vitals Blood pressure 101/63, pulse 75, temperature (!) 97.3 F (36.3 C), resp. rate 18, height 6' (1.829 m), weight 164 lb 14.5 oz (74.8 kg), SpO2 100 %.  Physical Exam  Constitutional: He is oriented to person, place, and time. He appears well-developed and well-nourished.  HENT:  Head: Normocephalic and atraumatic.  Mouth/Throat: Oropharynx is clear and moist. No oropharyngeal exudate.  Thin white hair.  Mask.  Eyes: Pupils are equal, round, and reactive to light. Conjunctivae and EOM are normal. No scleral icterus.  Glasses.  Blue eyes.  Neck: Normal range of motion. Neck supple. No JVD present.  Cardiovascular: Normal rate, regular rhythm and normal heart sounds. Exam reveals no gallop.  No murmur heard. Pulmonary/Chest: Effort normal  and breath sounds normal. No respiratory distress. He has no wheezes. He has no rales.  Abdominal: Soft. Bowel sounds are normal. He exhibits no distension and no mass. There is no hepatosplenomegaly. There is no abdominal tenderness. There is no rebound and no guarding.  Musculoskeletal: Normal range of motion.        General: No edema.  Lymphadenopathy:    He has no cervical adenopathy.    He has no axillary adenopathy.       Right: No supraclavicular adenopathy present.       Left: No supraclavicular adenopathy present.  Neurological: He is alert and oriented to person, place, and time.  Right foot drop.  Skin: Skin is warm and dry. No rash noted. He is not diaphoretic. No erythema. No pallor.  Psychiatric: He has a normal mood and affect. His behavior is normal. Judgment and thought content normal.  Nursing note and vitals reviewed.   Appointment on 06/02/2019  Component Date Value Ref Range Status  . LDH 06/02/2019 686* 98 - 192 U/L Final   Performed at [facility], [address] [place], [place] [address]  . Sodium 06/02/2019 136  135 - 145 mmol/L Final  . Potassium 06/02/2019 4.2  3.5 - 5.1 mmol/L Final  . Chloride 06/02/2019 104  98 - 111 mmol/L Final  . CO2 06/02/2019 23  22 - 32 mmol/L Final  . Glucose, Bld 06/02/2019 86  70 - 99 mg/dL Final  . BUN 06/02/2019 27* 8 - 23 mg/dL Final  . Creatinine, Ser 06/02/2019 1.52* 0.61 - 1.24 mg/dL Final  . Calcium 06/02/2019 9.1  8.9 - 10.3 mg/dL Final  . Total Protein 06/02/2019 7.9  6.5 - 8.1 g/dL Final  . Albumin 06/02/2019 4.2  3.5 - 5.0 g/dL Final  . AST 06/02/2019 37  15 - 41 U/L Final  . ALT 06/02/2019 14  0 - 44 U/L Final  . Alkaline Phosphatase 06/02/2019 72  38 - 126 U/L Final  . Total Bilirubin 06/02/2019 0.9  0.3 - 1.2 mg/dL Final  . GFR calc non Af Amer 06/02/2019 45* >60 mL/min Final  . GFR calc Af Amer 06/02/2019 52* >60 mL/min Final  . Anion gap 06/02/2019 9  5 - 15 Final   Performed at  [facility]  [facility], [address] [place], [place] [address]  . WBC 06/02/2019 27.5* 4.0 - 10.5 K/uL Final  . RBC 06/02/2019 3.72* 4.22 - 5.81 MIL/uL Final  . Hemoglobin 06/02/2019 10.3* 13.0 - 17.0 g/dL Final  . HCT 06/02/2019 32.8* 39.0 - 52.0 % Final  . MCV 06/02/2019 88.2  80.0 - 100.0 fL Final  . MCH 06/02/2019 27.7  26.0 - 34.0 pg Final  . MCHC 06/02/2019 31.4  30.0 - 36.0 g/dL Final  . RDW 06/02/2019 21.8* 11.5 - 15.5 % Final  . Platelets 06/02/2019 161  150 - 400 K/uL Final  . nRBC 06/02/2019 2.5* 0.0 - 0.2 % Final   Performed at [facility], [address] [place], [place] [address]  . Neutrophils Relative % 06/02/2019 PENDING  % Incomplete  . Neutro Abs 06/02/2019 PENDING  1.7 - 7.7 K/uL Incomplete  . Band Neutrophils 06/02/2019 PENDING  % Incomplete  . Lymphocytes Relative 06/02/2019 PENDING  % Incomplete  . Lymphs Abs 06/02/2019 PENDING  0.7 - 4.0 K/uL Incomplete  . Monocytes Relative 06/02/2019 PENDING  % Incomplete  . Monocytes Absolute 06/02/2019 PENDING  0.1 - 1.0 K/uL Incomplete  . Eosinophils Relative 06/02/2019 PENDING  % Incomplete  . Eosinophils Absolute 06/02/2019 PENDING  0.0 - 0.5 K/uL Incomplete  . Basophils Relative 06/02/2019 PENDING  % Incomplete  . Basophils Absolute 06/02/2019 PENDING  0.0 - 0.1 K/uL Incomplete  . WBC Morphology 06/02/2019 PENDING   Incomplete  . RBC Morphology 06/02/2019 PENDING   Incomplete  . Smear Review 06/02/2019 PENDING   Incomplete  . Other 06/02/2019 PENDING  % Incomplete  . nRBC 06/02/2019 PENDING  0 /100 WBC Incomplete  . Metamyelocytes Relative 06/02/2019 PENDING  % Incomplete  . Myelocytes 06/02/2019 PENDING  % Incomplete  . Promyelocytes Relative 06/02/2019 PENDING  % Incomplete  . Blasts 06/02/2019 PENDING  % Incomplete    Assessment:  Ruben [name] is a 73 y.o. 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 He has had polycythemia dating back to 2013. Hematocrit was 62.1 with a hemoglobin of 19.8 on 03/28/2015. 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 Erythropoietin level was 1.1 (low). 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  He began a phlebotomyprogram on 03/28/2015 to maintain a hematocrit goal of <45.Last phlebotomywason 05/02/2015. He is on a baby aspirin.  CBC on 11/28/2015 revealed a hematocrit of 39.3, hemoglobin 11.9, platelets 463,000, WBC 32,000 with an ANC of 25,000. Differential included 71% segs, 4% lymphocytes and 17% monocytes. Peripheral smear revealed leukocytosis with predominantly mature neutrophils, increased monocytes and rare blasts (<1%).  Bone marrowon 12/14/2015 revealed a persistent myeloproliferative neoplasm with myelofibrosis and alterations compatible with myelodysplatic progression. Marrow was packed (95-100% cellularity) with pan myelosis, multi-lineage dyspoiesis, and no significant increase in blasts. There was moderate to focally marked reticulin fibrosis (grade 2-3/3). Storage iron was not identified. Flow cytometry revealed non-specific atypical myeloid findings with no increase in blasts. 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 Cytogenetics were normal (46, XY).  B[facility]on 04/07/2016 at [facility] revealed a hypercellular marrow (>95%) with persistent involvement by myeloid neoplasm with 5% blasts. There was mild reticulin fibrosis. FISH t(9;22) results were normal. 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 Cytogenetics are pending.  Bone marrowon 08/09/2018 revealed a hypercellular bone marrow (>95%) with persistent involvement by myeloid neoplasm with <1% blasts by manual touch preparation differential. There was marked reticulin fibrosis  with focal collagen deposition. Cytogenetics were normal (46, XY). - Myeloid mutation panel study is pending.  He was briefly on hydroxyurea500 mg a day  (12/05/2015 - 12/19/2015 and 06/25/2016 - 07/03/2016). Platelet count increased to 1.18 million and white count to 54,800 without increased blasts on 06/25/2016. CBC on 07/02/2016 revealed a platelets of 429,000 with a white count 30,600. He began allopurinolon 12/19/2015. He began Jakafi 20 mg BID on 05/28/2016. He has been onJakafi25 mg BID since09/12/2016.  LDHis followed: 466 on 01/02/2016, 596 on 05/23/2016, 663 on 07/30/2016, 522 on 09/24/2016, 638 on 01/05/2017, 625 on 02/25/2017, 1144 on 06/10/2017, 720 on 06/17/2017, 728 on 06/24/2017, and 724 on 01/06/2018. Triglycerideswere 183 (<150) on 08/20/2016, 180 on 01/05/2017, 312 on 06/17/2017, 165 on 11/25/2017, 113 on 02/17/2018.  He has urinary retention. Renal ultrasoundon 07/28/2016 revealed bilateral hydronephrosis (right >left) and a large postvoid residual (2 liters) suggesting bladder dysfunction or outlet obstruction. He underwent temporary Foley catheter placement. He performs I/O self catheterizations. He is on Flomax and finasteride. PSAwas 2.02 on 07/09/2016.  He is followed by GI (Dr. Vira Agar) for a history of polypsand Crohn's disease.He had an unremarkable colonoscopy in 03/2017.AbdomenandpelvisCTon 04/03/2019 revealed mild wall thickening of portions of a loop of small bowel within the pelvis with intervening areas of mildly dilated small bowel,consistent with active Crohn's disease. No evidence of an abscess or fistula. No other evidence of bowel inflammation.There was splenomegaly(16.7 x 7.7 x 15.6 cm), developed since12/11/2011.There was mild bladder wall thickeningandenlargement of the prostate. Chronic bladder outlet obstructionwas felt to bethe etiology of bladder wall thickening.  He has chronicrenal insufficiency(Cr 1.85; CrCl 36 ml/min). SPEP on 09/23/2016 was negative. Spot urine revealed 17.4% of 27.3 mg/dL.of a monoclonal protein. 24 hour urine on 10/16/2016 revealed no  monoclonal protein. Renal function transiently decreased in 06/2017 after Bactrim. He is followed by nephrology and urology.  He has a history of epistaxis. Normal studies included: PT, PTT, von Willebrand panel, and platelet function assay (PFA).  Symptomatically, he is doing well.  Crohn's flare has resolved.  He has a right foot drop.  Exam reveals no adenopathy or hepatosplenomegaly.  Plan: 1.  Labs today: CBC with diff, CMP, LDH, ferritin. 2. Polycythemia rubra vera Clinically, he is doing well.    Hematocrit 32.8.  Hemoglobin 10.3.  Platelets 161,000.  White count 27,500 with an ANC of 18,400.     Peripheral smear reveals rare blast (stable).   LDH 686 (down from 1082; approximate baseline 635-702). Elevated white count of 72,900 during Crohn's flare has resolved  Continue Jakafi.  Patient has been out of his allopurinol.     Uric acid is 7.1 (normal) today.   Continue to monitor without reinstitution of allopurinol.  Continues close surveillance. 3. Iron deficiency Hematocrit 32.8.  Hemoglobin 10.3.  MCV 88.2.              Ferritin 88 (prior value 163 during Crohn's flare)  Continue oral iron 3 days/week (Mondays, Wednesdays and Fridays). Continue to monitor. 4.Crohn's disease Patient followed by GI [facility].             Steroid taper complete.  Continue to monitor. 5.   Right foot drop  Etiology unclear.  Scheduled for EMG and MRI of the head and lumbar spine.  Follow-up with Dr. Manuella Ghazi, neurologist. 6.   RTC in 6 weeks for labs (CBC with diff, uric acid). 7.   RTC 2 months for MD assessment and labs (CBC with diff, CMP, LDH, uric acid,  ferritin).  I discussed the assessment and treatment plan with the patient.  The patient was provided an opportunity to ask questions and all were answered.  The patient agreed with the plan and demonstrated an understanding of the instructions.  The patient was  advised to call back if the symptoms worsen or if the condition fails to improve as anticipated.   Lequita Asal, MD, PhD    06/02/2019, 9:34 AM  I, Selena Batten, am acting as scribe for [employer]. Mike Gip, MD, PhD.  I,  C. Mike Gip, MD, have reviewed the above documentation for accuracy and completeness, and I agree with the above.

## 2019-06-02 ENCOUNTER — Other Ambulatory Visit: Payer: Self-pay | Admitting: Hematology and Oncology

## 2019-06-02 ENCOUNTER — Other Ambulatory Visit: Payer: Self-pay

## 2019-06-02 ENCOUNTER — Encounter: Payer: Self-pay | Admitting: Hematology and Oncology

## 2019-06-02 ENCOUNTER — Inpatient Hospital Stay: Payer: Medicare Other

## 2019-06-02 ENCOUNTER — Telehealth: Payer: Self-pay

## 2019-06-02 ENCOUNTER — Inpatient Hospital Stay: Payer: Medicare Other | Attending: Hematology and Oncology | Admitting: Hematology and Oncology

## 2019-06-02 VITALS — BP 101/63 | HR 75 | Temp 97.3°F | Resp 18 | Ht 72.0 in | Wt 164.9 lb

## 2019-06-02 DIAGNOSIS — D500 Iron deficiency anemia secondary to blood loss (chronic): Secondary | ICD-10-CM | POA: Diagnosis not present

## 2019-06-02 DIAGNOSIS — K5090 Crohn's disease, unspecified, without complications: Secondary | ICD-10-CM | POA: Insufficient documentation

## 2019-06-02 DIAGNOSIS — N401 Enlarged prostate with lower urinary tract symptoms: Secondary | ICD-10-CM | POA: Diagnosis not present

## 2019-06-02 DIAGNOSIS — Z87891 Personal history of nicotine dependence: Secondary | ICD-10-CM | POA: Insufficient documentation

## 2019-06-02 DIAGNOSIS — M21371 Foot drop, right foot: Secondary | ICD-10-CM | POA: Diagnosis not present

## 2019-06-02 DIAGNOSIS — G4700 Insomnia, unspecified: Secondary | ICD-10-CM | POA: Insufficient documentation

## 2019-06-02 DIAGNOSIS — D45 Polycythemia vera: Secondary | ICD-10-CM

## 2019-06-02 DIAGNOSIS — R338 Other retention of urine: Secondary | ICD-10-CM | POA: Diagnosis not present

## 2019-06-02 DIAGNOSIS — N189 Chronic kidney disease, unspecified: Secondary | ICD-10-CM | POA: Diagnosis not present

## 2019-06-02 DIAGNOSIS — Z79899 Other long term (current) drug therapy: Secondary | ICD-10-CM | POA: Insufficient documentation

## 2019-06-02 DIAGNOSIS — Z7189 Other specified counseling: Secondary | ICD-10-CM

## 2019-06-02 LAB — CBC WITH DIFFERENTIAL/PLATELET
Abs Immature Granulocytes: 4.1 10*3/uL — ABNORMAL HIGH (ref 0.00–0.07)
Band Neutrophils: 7 %
Basophils Absolute: 0.3 10*3/uL — ABNORMAL HIGH (ref 0.0–0.1)
Basophils Relative: 1 %
Blasts: 1 %
Eosinophils Absolute: 0.6 10*3/uL — ABNORMAL HIGH (ref 0.0–0.5)
Eosinophils Relative: 2 %
HCT: 32.8 % — ABNORMAL LOW (ref 39.0–52.0)
Hemoglobin: 10.3 g/dL — ABNORMAL LOW (ref 13.0–17.0)
Lymphocytes Relative: 10 %
Lymphs Abs: 2.8 10*3/uL (ref 0.7–4.0)
MCH: 27.7 pg (ref 26.0–34.0)
MCHC: 31.4 g/dL (ref 30.0–36.0)
MCV: 88.2 fL (ref 80.0–100.0)
Metamyelocytes Relative: 7 %
Monocytes Absolute: 1.1 10*3/uL — ABNORMAL HIGH (ref 0.1–1.0)
Monocytes Relative: 4 %
Myelocytes: 8 %
Neutro Abs: 18.4 10*3/uL — ABNORMAL HIGH (ref 1.7–7.7)
Neutrophils Relative %: 60 %
Platelets: 161 10*3/uL (ref 150–400)
RBC: 3.72 MIL/uL — ABNORMAL LOW (ref 4.22–5.81)
RDW: 21.8 % — ABNORMAL HIGH (ref 11.5–15.5)
WBC: 27.5 10*3/uL — ABNORMAL HIGH (ref 4.0–10.5)
nRBC: 1 /100 WBC — ABNORMAL HIGH
nRBC: 2.5 % — ABNORMAL HIGH (ref 0.0–0.2)

## 2019-06-02 LAB — COMPREHENSIVE METABOLIC PANEL
ALT: 14 U/L (ref 0–44)
AST: 37 U/L (ref 15–41)
Albumin: 4.2 g/dL (ref 3.5–5.0)
Alkaline Phosphatase: 72 U/L (ref 38–126)
Anion gap: 9 (ref 5–15)
BUN: 27 mg/dL — ABNORMAL HIGH (ref 8–23)
CO2: 23 mmol/L (ref 22–32)
Calcium: 9.1 mg/dL (ref 8.9–10.3)
Chloride: 104 mmol/L (ref 98–111)
Creatinine, Ser: 1.52 mg/dL — ABNORMAL HIGH (ref 0.61–1.24)
GFR calc Af Amer: 52 mL/min — ABNORMAL LOW (ref >60)
GFR calc non Af Amer: 45 mL/min — ABNORMAL LOW (ref >60)
Glucose, Bld: 86 mg/dL (ref 70–99)
Potassium: 4.2 mmol/L (ref 3.5–5.1)
Sodium: 136 mmol/L (ref 135–145)
Total Bilirubin: 0.9 mg/dL (ref 0.3–1.2)
Total Protein: 7.9 g/dL (ref 6.5–8.1)

## 2019-06-02 LAB — URIC ACID: Uric Acid, Serum: 7.1 mg/dL (ref 3.7–8.6)

## 2019-06-02 LAB — LACTATE DEHYDROGENASE: LDH: 686 U/L — ABNORMAL HIGH (ref 98–192)

## 2019-06-02 LAB — FERRITIN: Ferritin: 88 ng/mL (ref 24–336)

## 2019-06-02 NOTE — Telephone Encounter (Signed)
With patient's permission, detailed VM left informing patient Uric Acid levels were normal and, per Dr. Mike Gip, he does not need Allopurinol at this time. Phone number provided should he have any questions.

## 2019-06-02 NOTE — Telephone Encounter (Signed)
-----   Message from Lequita Asal, MD sent at 06/02/2019 11:48 AM EDT ----- Regarding: Please call patient  Uric acid is normal.  Allopurinol not needed.  M ----- Message ----- From: Buel Ream, Lab In [place] Sent: 06/02/2019   8:41 AM EDT To: Lequita Asal, MD

## 2019-06-02 NOTE — Progress Notes (Signed)
No new changes noted today 

## 2019-06-09 ENCOUNTER — Other Ambulatory Visit: Payer: Self-pay

## 2019-06-09 ENCOUNTER — Ambulatory Visit
Admission: RE | Admit: 2019-06-09 | Discharge: 2019-06-09 | Disposition: A | Discharge status: Home / Self Care | Payer: Medicare Other | Source: Ambulatory Visit | Attending: Neurology | Admitting: Neurology

## 2019-06-09 DIAGNOSIS — M21371 Foot drop, right foot: Secondary | ICD-10-CM | POA: Insufficient documentation

## 2019-06-09 DIAGNOSIS — R531 Weakness: Secondary | ICD-10-CM | POA: Diagnosis not present

## 2019-06-09 DIAGNOSIS — M48061 Spinal stenosis, lumbar region without neurogenic claudication: Secondary | ICD-10-CM | POA: Diagnosis not present

## 2019-06-09 DIAGNOSIS — M8938 Hypertrophy of bone, other site: Secondary | ICD-10-CM | POA: Diagnosis not present

## 2019-06-13 DIAGNOSIS — M545 Low back pain: Secondary | ICD-10-CM | POA: Diagnosis not present

## 2019-06-13 DIAGNOSIS — M21371 Foot drop, right foot: Secondary | ICD-10-CM | POA: Diagnosis not present

## 2019-06-13 DIAGNOSIS — R531 Weakness: Secondary | ICD-10-CM | POA: Diagnosis not present

## 2019-06-22 DIAGNOSIS — L538 Other specified erythematous conditions: Secondary | ICD-10-CM | POA: Diagnosis not present

## 2019-06-22 DIAGNOSIS — Z85828 Personal history of other malignant neoplasm of skin: Secondary | ICD-10-CM | POA: Diagnosis not present

## 2019-06-22 DIAGNOSIS — D0462 Carcinoma in situ of skin of left upper limb, including shoulder: Secondary | ICD-10-CM | POA: Diagnosis not present

## 2019-06-22 DIAGNOSIS — D2262 Melanocytic nevi of left upper limb, including shoulder: Secondary | ICD-10-CM | POA: Diagnosis not present

## 2019-06-22 DIAGNOSIS — D485 Neoplasm of uncertain behavior of skin: Secondary | ICD-10-CM | POA: Diagnosis not present

## 2019-06-22 DIAGNOSIS — D2272 Melanocytic nevi of left lower limb, including hip: Secondary | ICD-10-CM | POA: Diagnosis not present

## 2019-06-22 DIAGNOSIS — D2261 Melanocytic nevi of right upper limb, including shoulder: Secondary | ICD-10-CM | POA: Diagnosis not present

## 2019-06-22 DIAGNOSIS — X32XXXA Exposure to sunlight, initial encounter: Secondary | ICD-10-CM | POA: Diagnosis not present

## 2019-06-22 DIAGNOSIS — L820 Inflamed seborrheic keratosis: Secondary | ICD-10-CM | POA: Diagnosis not present

## 2019-06-22 DIAGNOSIS — L570 Actinic keratosis: Secondary | ICD-10-CM | POA: Diagnosis not present

## 2019-06-22 MED FILL — JAKAFI 25 MG TABLET: 25 | 30 days supply | Qty: 60 | Fill #1

## 2019-06-28 ENCOUNTER — Ambulatory Visit: Payer: Medicare Other | Attending: Neurology | Admitting: Physical Therapy

## 2019-06-28 ENCOUNTER — Other Ambulatory Visit: Payer: Self-pay

## 2019-06-28 ENCOUNTER — Encounter: Payer: Self-pay | Admitting: Physical Therapy

## 2019-06-28 DIAGNOSIS — R29898 Other symptoms and signs involving the musculoskeletal system: Secondary | ICD-10-CM

## 2019-06-28 DIAGNOSIS — M21371 Foot drop, right foot: Secondary | ICD-10-CM | POA: Diagnosis not present

## 2019-06-28 DIAGNOSIS — R269 Unspecified abnormalities of gait and mobility: Secondary | ICD-10-CM | POA: Insufficient documentation

## 2019-06-28 DIAGNOSIS — M25661 Stiffness of right knee, not elsewhere classified: Secondary | ICD-10-CM | POA: Insufficient documentation

## 2019-06-28 NOTE — Patient Instructions (Signed)
Access Code: [email or web address]  URL: [email or web address]  Date: 06/28/2019  Prepared by: Dorcas Carrow   Exercises  Mini Squat with Counter Support - 10 reps - 2 sets - 1x daily - 4x weekly  Single Leg Heel Raise with Counter Support - 20 reps - 2 sets - 1x daily - 4x weekly  Standing Gastroc Stretch on Step with Counter Support - 20 reps - 2 sets - 5 second hold - 1x daily - 4x weekly  Heel Toe Raises with Unilateral Counter Support - 20 reps - 2 sets - 1x daily - 4x weekly  Ankle Alphabet in Elevation - 1 reps - 2 sets - 1x daily - 7x weekly  Standing Gastroc Stretch on Step - 1 reps - 2 sets - 30 hold - 1x daily - 7x weekly

## 2019-06-30 NOTE — Therapy (Signed)
[facility] [address] [place], [place], [address] Phone: [phone]   Fax:  [phone]  Physical Therapy Evaluation  Patient Details  Name: Ruben [name] MRN: [id number] Date of Birth: 1946/03/25 Referring Provider (PT): Hemang K. Manuella Ghazi, MD   Encounter Date: 06/28/2019  PT End of Session - 06/30/19 1856    Visit Number  1    Number of Visits  8    Date for PT Re-Evaluation  07/26/19    Authorization - Visit Number  1    Authorization - Number of Visits  10    PT Start Time  0900    PT Stop Time  1005    PT Time Calculation (min)  65 min    Equipment Utilized During Treatment  Gait belt    Activity Tolerance  Patient tolerated treatment well    Behavior During Therapy  WFL for tasks assessed/performed       Past Medical History:  Diagnosis Date  . Blood dyscrasia   . BPH (benign prostatic hyperplasia)   . Cancer ([place])    SKIN/ POLYCYTHEMIA VERA  . Chronic kidney disease    RENAL INSUFF (40%)  . Crohn disease ([place])   . Crohn's disease ([place])   . Glaucoma   . Glaucoma   . Gout   . History of chicken pox   . Myelofibrosis ([place])   . Nosebleed   . RBBB   . Right bundle branch block     Past Surgical History:  Procedure Laterality Date  . CATARACT EXTRACTION W/PHACO Left 07/13/2018   Procedure: CATARACT EXTRACTION PHACO AND INTRAOCULAR LENS PLACEMENT (IOC);  Surgeon: [name], MD;  Location: [facility] ORS;  Service: Ophthalmology;  Laterality: Left;  [place] 00:39  CDE 5.14 Fluid pack lot # [id number]  . CATARACT EXTRACTION W/PHACO Right 08/03/2018   Procedure: CATARACT EXTRACTION PHACO AND INTRAOCULAR LENS PLACEMENT (IOC);  Surgeon: [name], MD;  Location: [facility] ORS;  Service: Ophthalmology;  Laterality: Right;  [place] 00:43.4 CDE 5.23 Fluid pack Lot # [id number]  . COLONOSCOPY WITH PROPOFOL N/A 03/27/2017   Procedure: COLONOSCOPY WITH PROPOFOL;  Surgeon: [name], MD;  Location: [facility] ENDOSCOPY;   Service: Endoscopy;  Laterality: N/A;  . MOHS SURGERY     EAR  . PROSTATE SURGERY  2008   Prostate Biopsy in [place] to elevated PSA.  per patient normal  . Skin Lesion Basal cell removed    . wart removal     from eyelid    There were no vitals filed for this visit.   Subjective Assessment - 06/30/19 1829    Subjective  Pt reports no pain today (0/10).  Pt presents to therapy with c/c of right foot drop and right LE weakness.  Pt reports that with onset in September he had "lots of falls initially" but has improved with "not many falls in the last couple of weeks."  Pt reports no injuries with falls.  Pt reports he is improving.    Pertinent History  Pt presents to therapy with dx of right foot drop and R sided weakness.  Onset was in September and has been improving.  Pt reports frequent falls initially that has decreased over time with few in the last couple of weeks (no significant injuries from previous falls).  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  Pt somewhat active; walks a mile with wife everyday and does yardwork.  Worked with [employer] up until 3 years  ago due to balance issues and poor skin integrity.    Limitations  Walking;House hold activities    Patient Stated Goals  Decrease falls, improve balance, safety with ADLs    Currently in Pain?  No/denies         [facility] PT Assessment - 06/30/19 0001      Assessment   Medical Diagnosis  R foot drop; R sided weakness    Referring Provider (PT)  Hemang K. Manuella Ghazi, MD    Onset Date/Surgical Date  05/26/19    Prior Therapy  no      Balance Screen   Has the patient fallen in the past 6 months  Yes      Prior Function   Level of Independence  Independent      Cognition   Overall Cognitive Status  Within Functional Limits for tasks assessed      Sensation   Light Touch  Appears Intact    Proprioception  Appears Intact      Standardized Balance Assessment   Standardized Balance Assessment  Berg  Balance Test      Berg Balance Test   Sit to Stand  Able to stand without using hands and stabilize independently    Standing Unsupported  Able to stand safely 2 minutes    Sitting with Back Unsupported but Feet Supported on Floor or Stool  Able to sit safely and securely 2 minutes    Stand to Sit  Sits safely with minimal use of hands    Transfers  Able to transfer safely, minor use of hands    Standing Unsupported with Eyes Closed  Able to stand 10 seconds safely    Standing Unsupported with Feet Together  Able to place feet together independently and stand 1 minute safely    From Standing, Reach Forward with Outstretched Arm  Can reach confidently >25 cm (10")    From Standing Position, Pick up Object from Floor  Able to pick up shoe safely and easily    From Standing Position, Turn to Look Behind Over each Shoulder  Looks behind from both sides and weight shifts well    Turn 360 Degrees  Able to turn 360 degrees safely in 4 seconds or less    Standing Unsupported, Alternately Place Feet on Step/Stool  Able to stand independently and safely and complete 8 steps in 20 seconds    Standing Unsupported, One Foot in Front  Able to place foot tandem independently and hold 30 seconds    Standing on One Leg  Able to lift leg independently and hold 5-10 seconds    Total Score  55         Mental Status Patient is oriented to person, place and time.  Recent memory is intact.  Remote memory is intact.  Attention span and concentration are intact.  Expressive speech is intact.  Patient's fund of knowledge is within normal limits for educational level.  SENSATION: R/L WNL  PROPRIOCEPTION: R/L WNL  REFLEXES: Not tested  MUSCULOSKELETAL: Tremor: None Bulk: Normal Tone: None  Posture: Pt presents with rounded shoulders and forward head posture.   Gait: Pt presents with consistent heel strike and fair eccentric control of R DF in loading phase. To be further examined next  visit.   Palpation: No TTP in LEs   Strength (out of 5): R/L Hip flexion: 4+/5 Hip abduction (seated):5/5 Hip adduction (seated):5/5 Knee extension:5/5 Knee flexion:4+/5 Ankle dorsiflexion:3/5 Ankle plantarflexion:5/5 *Indicates pain  AROM: WNL, except for R  dorsiflexion  PROM: WFL B  Repeated Movements: Not tested  Passive Accessory Movements: Not tested    Hamstring: Distal R: -35, L: -39  Berg: 55/56  Heel Raise Test: 25 with decreased ROM at 20 B  Modified CTSIB: No concerns in any condition      Objective measurements completed on examination: See above findings.     PT Education - 06/30/19 1841    Education Details  Pt educated on HEP    Person(s) Educated  Patient    Methods  Explanation;Demonstration    Comprehension  Verbalized understanding;Returned demonstration       PT Short Term Goals - 06/30/19 1926      PT SHORT TERM GOAL #1   Title  Pt will present with no significant postural limitations to demonstrate postural control.    Baseline  Pt presents with rounded shoulders/ forward head posture 06/28/2019    Time  2    Period  Weeks    Status  New    Target Date  07/12/19        PT Long Term Goals - 06/30/19 1929      PT LONG TERM GOAL #1   Title  Pt will demonstrate FOTO score of 81 to indicate improved functional mobility.    Baseline  Pt FOTO score 70 06/28/2019    Time  4    Period  Weeks    Status  New    Target Date  07/26/19      PT LONG TERM GOAL #2   Title  Pt R LE MMT assessment will reveal 5/5 grossly to indicate increased strength for functional activity (ie. yardwork).    Baseline  Pt R LE hip/ knee flexion 4+/5, ankle dorsiflexion 3/5 06/28/2019    Time  4    Period  Weeks    Status  New    Target Date  07/26/19      PT LONG TERM GOAL #3   Title  Pt will demonstrate heel raise test with 25 full ROM reps B to demonstrate increased LE endurance.    Baseline  Heel raise test 20 reps with decreased ROM 06/28/2019     Time  4    Period  Weeks    Status  New    Target Date  07/26/19      PT LONG TERM GOAL #4   Title  Pt will report no falls in 3 weeks to demonstrate increased balance and safety at home.    Baseline  Pt reports multiple falls in the last couple of weeks 06/28/2019    Time  4    Period  Weeks    Status  New    Target Date  07/26/19             Plan - 06/30/19 1910    Clinical Impression Statement  Pt is a pleasant 73 y.o. male presenting to therpay with c/c of R foot drop and R sided weakness.  Pt has no complaints of pain and no significant gait limitations or balance impairments with initial testing; will assess further in upcoming visits.  Upon assessment pt demonstrates decreased R LE strength and endurance as well as decreased R dorsiflexion AROM in comparison to L.  Pt will benefit from further skilled therapy to increase R LE strength, improve dorsiflexion AROM for improved functional mobility, and to further assess and address reported balance impairments.    Personal Factors and Comorbidities  Age;Comorbidity 1    Comorbidities  Bone marrow neofibrosis    Examination-Participation Restrictions  Yard Work    Stability/Clinical Decision Making  Stable/Uncomplicated    Clinical Decision Making  Low    Rehab Potential  Good    PT Frequency  2x / week    PT Duration  4 weeks    PT Treatment/Interventions  ADLs/Self Care Home Management;Electrical Stimulation;Moist Heat;Gait training;Stair training;Functional mobility training;Neuromuscular re-education;Balance training;Therapeutic exercise;Therapeutic activities;Patient/family education;Manual techniques;Passive range of motion    PT Next Visit Plan  DF ROM, DGI, LE strengthening    PT Home Exercise Plan  [id number]    Consulted and Agree with Plan of Care  Patient       Patient will benefit from skilled therapeutic intervention in order to improve the following deficits and impairments:  Abnormal gait, Decreased balance,  Decreased endurance, Decreased mobility, Difficulty walking, Hypomobility, Decreased range of motion, Improper body mechanics, Decreased activity tolerance, Decreased coordination, Decreased strength, Impaired flexibility, Postural dysfunction  Visit Diagnosis: Foot drop, right  Weakness of right lower extremity  Decreased range of motion of right lower extremity  Abnormality of gait     Problem List Patient Active Problem List   Diagnosis Date Noted  . Foot drop, right 06/09/2019  . Crohn's disease of small intestine without complication ([place]) 66/44/0347  . Right-sided thoracic back pain 04/06/2019  . Hyponatremia 04/06/2019  . Iron deficiency anemia 11/26/2018  . Goals of care, counseling/discussion 01/06/2018  . 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 High triglycerides 10/14/2017  . 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 Renal insufficiency 07/23/2016  . 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 Right ankle pain 10/31/2015  . Hyperuricemia 09/05/2015  . BPH (benign prostatic hyperplasia) 08/23/2015  . History of tobacco use 08/23/2015  . Glaucoma 08/23/2015  . Enlarged prostate without lower urinary tract symptoms ([place]) 08/23/2015  . Elevated prostate specific antigen (PSA) 08/23/2015  . Dermatitis 08/23/2015  . Crohn disease ([place]) 08/23/2015  . Other prurigo 08/23/2015  . Dorsalgia 08/23/2015  . Personal history of nicotine dependence 08/23/2015  . Polycythemia vera ([place]) 04/04/2015  . Secondary polycythemia 03/28/2015  . 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 Right bundle-branch block 05/03/2012   Pura Spice, PT, DPT # [id number] Chinita Greenland, SPT 07/01/2019, 7:40 AM  [facility] [address] [place], [place], [address] Phone: [phone]   Fax:  [phone]  Name: Ruben [name] MRN: [id number] Date of Birth: 1946-07-14

## 2019-07-04 ENCOUNTER — Telehealth: Payer: Self-pay

## 2019-07-04 ENCOUNTER — Other Ambulatory Visit: Payer: Self-pay

## 2019-07-04 DIAGNOSIS — R5383 Other fatigue: Secondary | ICD-10-CM

## 2019-07-04 DIAGNOSIS — D500 Iron deficiency anemia secondary to blood loss (chronic): Secondary | ICD-10-CM

## 2019-07-04 DIAGNOSIS — D45 Polycythemia vera: Secondary | ICD-10-CM

## 2019-07-04 NOTE — Telephone Encounter (Signed)
Spoke with eife, [name], and was informed patient was feeling fine on Friday and they went to the [facility]. Saturday patient was "lethargic" per wife. Sunday patient continued to lay around and slept for majority of the day. Reports patient is feeling slightly better today but would like patient to be seen. Dr. Mike Gip made aware. Message sent to Robin to schedule for 07/05/2019 at 10.

## 2019-07-04 NOTE — Progress Notes (Signed)
[facility]  [address], [address] [place], [place] [address] Phone: [phone]  Fax: [phone]   Clinic Day:  07/05/2019  Referring physician: [name], MD  Chief Complaint: Ruben [name] is a 73 y.o. 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   HPI: The patient was last seen in the hematology clinic on 06/02/2019. At that time, he was doing well. Crohn's flare had resolved. He had a right foot drop. Exam revealed no adenopathy or hepatosplenomegaly. Hematocrit 32.8, hemoglobin 10.3, platelets 161,000, WBC 27,500 (ANC 18,400). Ferritin was 88. Uric acid was normal and he didn't need any Allopurinol at that time. Patient continued Jakafi 25 mg daily and oral iron 3 days/week.   Patient was seen by Dr Manuella Ghazi, neurologist, on 05/25/2019 for foot drop.  Head MRI and lumbar spine MRI were ordered.  Nerve conduction studies were ordered.  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 There was no non-contrast MRI evidence of brain metastasis. There was only mild for age nonspecific cerebral white signal changes.   Lumbar spine MRI on 06/09/2019 showed diffusely abnormal marrow signal in the visible skeleton, as seen on the brain MRI (06/09/2019). There was no pathologic fracture and no extraosseous extension of tumor on the lumbar spine or visible sacrum. In conjunction with the unexplained splenomegaly by CT in July marrow infiltration by leukemia/lymphoma may be the most likely etiology. Differential considerations included multiple myeloma and metastatic disease unknown primary. The involvement of the pelvis should be amenable to bone marrow biopsy. There was superimposed mild for age lumbar spine degeneration with no spinal stenosis. There was mild degenerative L4 and L5 neural foraminal stenosis.   He was seen by Dr. Manuella Ghazi on 06/13/2019.  MRI findings were reviewed.  It was unclear if the  changes noted on imaging were related to his underlying hematologic disorder.  Physical therapy was ordered.  He had physical therapy with Chinita Greenland, Student-PT on 06/28/2019 for decreased range of motion and weakness of the right lower extremity (right foot drop).   Patient's wife called the clinic on 07/03/2019. She reported that he felt "lethargic" x 3 days. He laid around and slept the majority of the day x 2 days ago. She noted he was beginning to feel better, but would like for him to be seen in the clinic.   During the interim, he has felt  "much better". He does not feel at his baseline health.  He described fluctuating body temperatures, fatigue, no energy, and sleepiness 5 days ago. He had a low grade fever of 99.0 and some chills 3 days ago. Symptoms have subsequently resolved. He was worried that the cancer was starting to spread.  He has occasional rhinorrhea related to environmental allergies. His bowels are "semi regular". He has been off allopurinol since 05/2019. He notes mild improvement from physical therapy for his right foot drop. His balance and coordination is improving.  He reports an upcoming appointment with Dr. Adriana Simas.    Past Medical History:  Diagnosis Date   Blood dyscrasia    BPH (benign prostatic hyperplasia)    Cancer (HCC)    SKIN/ POLYCYTHEMIA VERA   Chronic kidney disease    RENAL INSUFF (40%)   Crohn disease (HCC)    Crohn's disease ([place])    Glaucoma    Glaucoma    Gout    History of chicken pox    Myelofibrosis ([place])  Nosebleed    RBBB    Right bundle branch block     Past Surgical History:  Procedure Laterality Date   CATARACT EXTRACTION W/PHACO Left 07/13/2018   Procedure: CATARACT EXTRACTION PHACO AND INTRAOCULAR LENS PLACEMENT ([place]);  Surgeon: [name], MD;  Location: [facility] ORS;  Service: Ophthalmology;  Laterality: Left;  [place] 00:39  CDE 5.14 Fluid pack lot # [id number]   CATARACT EXTRACTION W/PHACO  Right 08/03/2018   Procedure: CATARACT EXTRACTION PHACO AND INTRAOCULAR LENS PLACEMENT ([facility]);  Surgeon: [name], MD;  Location: [facility] ORS;  Service: Ophthalmology;  Laterality: Right;  [place] 00:43.4 CDE 5.23 Fluid pack Lot # [id number]   COLONOSCOPY WITH PROPOFOL N/A 03/27/2017   Procedure: COLONOSCOPY WITH PROPOFOL;  Surgeon: [name], MD;  Location: [facility] ENDOSCOPY;  Service: Endoscopy;  Laterality: N/A;   [facility]  2008   Prostate Biopsy in [place] to elevated PSA.  per patient normal   Skin Lesion Basal cell removed     wart removal     from eyelid    Family History  Problem Relation Age of Onset   Cancer Mother        Liver   Cancer Father        Prostate   Cancer Sister        Hodgkins lymphoma   Cancer Paternal Aunt        Breast    Social History:  reports that he quit smoking about 15 years ago. His smoking use included cigarettes. He has a 5.00 pack-year smoking history. He has never used smokeless tobacco. He reports current alcohol use of about 4.0 - 5.0 standard drinks of alcohol per week. He reports that he does not use drugs. He previously worked for [employer]. He no longer volunteers for [employer]. The patient's wife isJudith [phone]). He lives in [place]. The patient is accompanied by his wife, [name], on the phone [phone]) today.  Allergies: No Known Allergies  Current Medications: Current Outpatient Medications  Medication Sig Dispense Refill   acetaminophen (TYLENOL) 500 MG tablet Take 500 mg by mouth daily as needed for moderate pain.     Cholecalciferol (VITAMIN D3) 1000 units CAPS Take 1,000 Units by mouth daily at 12 noon.      Cyanocobalamin (RA VITAMIN B-12 TR) 1000 MCG TBCR Take 1,000 mcg by mouth daily at 12 noon.      ferrous sulfate 325 (65 FE) MG tablet Take 325 mg by mouth every Monday, Wednesday, and Friday.      finasteride (PROSCAR) 5 MG tablet Take 1 tablet  (5 mg total) by mouth every evening. 90 tablet 3   folic acid (FOLVITE) 1 MG tablet Take 1 mg by mouth daily at 12 noon.      JAKAFI 25 MG tablet TAKE 1 TABLET (25 MG TOTAL) BY MOUTH 2 TIMES DAILY. 60 tablet 5   latanoprost (XALATAN) 0.005 % ophthalmic solution Place 1 drop into both eyes at bedtime.      Multiple Vitamins-Minerals (CENTRUM SILVER ULTRA MENS) TABS Take 1 tablet by mouth daily at 12 noon.      Omega-3 Fatty Acids (KP FISH OIL) 1200 MG CAPS Take 1,200 mg by mouth daily.      sodium bicarbonate 650 MG tablet Take 650 mg by mouth 2 (two) times daily.     timolol (TIMOPTIC) 0.5 % ophthalmic solution Place 1 drop into both eyes daily. In AM  5  allopurinol (ZYLOPRIM) 300 MG tablet Take 0.5 tablets (150 mg total) by mouth daily. 30 tablet 1   No current facility-administered medications for this visit.     Review of Systems  Constitutional: Negative for chills, diaphoresis, fever, malaise/fatigue and weight loss (up 1 pound).       Feels "much better" today.  HENT: Negative.  Negative for congestion, ear pain, hearing loss, nosebleeds, sinus pain and sore throat.   Eyes: Negative for blurred vision, double vision and photophobia.       S/p cataract surgery.  Uses reading glasses.  Respiratory: Negative.  Negative for cough, hemoptysis, sputum production and shortness of breath.   Cardiovascular: Negative.  Negative for chest pain, palpitations, orthopnea, leg swelling and PND.  Gastrointestinal: Negative for abdominal pain (Crohn's ), blood in stool, constipation, diarrhea, heartburn, melena, nausea and vomiting.       Bowels are "semi-regular".  Genitourinary: Negative.  Negative for dysuria, frequency, hematuria and urgency.  Musculoskeletal: Negative.  Negative for back pain, falls, joint pain, myalgias and neck pain.  Skin: Negative.  Negative for itching and rash.  Neurological: Positive for focal weakness (right foot drop). Negative for dizziness, tingling, tremors,  sensory change, speech change, weakness and headaches.       Right foot drop, improved.    Endo/Heme/Allergies: Positive for environmental allergies (rhinorrhea, occasional). Does not bruise/bleed easily.  Psychiatric/Behavioral: Positive for memory loss (mild). Negative for depression and substance abuse. The patient is not nervous/anxious and does not have insomnia.   All other systems reviewed and are negative.  Performance status (ECOG): 1  Vitals Blood pressure 111/75, pulse 71, temperature 97.8 F (36.6 C), temperature source Tympanic, resp. rate 16, weight 165 lb 5.5 oz (75 kg), SpO2 100 %.  Physical Exam  Constitutional: He is oriented to person, place, and time. He appears well-developed and well-nourished.  HENT:  Head: Normocephalic and atraumatic.  Mouth/Throat: Oropharynx is clear and moist. No oropharyngeal exudate.  Thin gray hair.  Mask.  Eyes: Pupils are equal, round, and reactive to light. Conjunctivae and EOM are normal. No scleral icterus.  Glasses.  Blue eyes.  Neck: Normal range of motion. Neck supple. No JVD present.  Cardiovascular: Normal rate, regular rhythm and normal heart sounds. Exam reveals no gallop.  No murmur heard. Pulmonary/Chest: Effort normal and breath sounds normal. No respiratory distress. He has no wheezes. He has no rales.  Abdominal: Soft. Bowel sounds are normal. He exhibits no distension and no mass. There is no hepatosplenomegaly. There is no abdominal tenderness. There is no rebound and no guarding.  Musculoskeletal: Normal range of motion.        General: No edema.  Lymphadenopathy:    He has no cervical adenopathy.    He has no axillary adenopathy.       Right: No supraclavicular adenopathy present.       Left: No supraclavicular adenopathy present.  Neurological: He is alert and oriented to person, place, and time.  Right foot drop (50% improvement). Able to lift toes off of floor.  Skin: Skin is warm and dry. No rash noted. He is  not diaphoretic. No erythema. No pallor.  Psychiatric: He has a normal mood and affect. His behavior is normal. Judgment and thought content normal.  Nursing note and vitals reviewed.   Orders Only on 07/05/2019  Component Date Value Ref Range Status   Path Review 07/05/2019 Blood smear is reviewed.   Final   Comment: Patient with known polycythemia vera on Jakafi.  Per CareEverywhere prior bone marrow biopsies document MPN with dysplasia. Leukocytosis, progressed since last month. Left shift with myelocytes and rare blast present. Blasts less than 5 %.   Neutrophils with dysplastic features including pseudo Pelger Huet change and hypogranulation. Stable normocytic anemia with rare teardrop cell. Stable platelet count with variation in size and focal hypogranularity. Findings are consistent with the patient's known myeloproliferative neoplasm.  Reviewed by Dellia Nims Reuel Derby, M.D. Performed at [facility], [address] [place], [place] [address]   Appointment on 07/05/2019  Component Date Value Ref Range Status   Sodium 07/05/2019 133* 135 - 145 mmol/L Final   Potassium 07/05/2019 4.7  3.5 - 5.1 mmol/L Final   Chloride 07/05/2019 103  98 - 111 mmol/L Final   CO2 07/05/2019 23  22 - 32 mmol/L Final   Glucose, Bld 07/05/2019 99  70 - 99 mg/dL Final   BUN 07/05/2019 35* 8 - 23 mg/dL Final   Creatinine, Ser 07/05/2019 1.69* 0.61 - 1.24 mg/dL Final   Calcium 07/05/2019 9.1  8.9 - 10.3 mg/dL Final   Total Protein 07/05/2019 8.3* 6.5 - 8.1 g/dL Final   Albumin 07/05/2019 4.0  3.5 - 5.0 g/dL Final   AST 07/05/2019 33  15 - 41 U/L Final   ALT 07/05/2019 14  0 - 44 U/L Final   Alkaline Phosphatase 07/05/2019 70  38 - 126 U/L Final   Total Bilirubin 07/05/2019 1.0  0.3 - 1.2 mg/dL Final   GFR calc non Af Amer 07/05/2019 40* >60 mL/min Final   GFR calc Af Amer 07/05/2019 46* >60 mL/min Final   Anion gap 07/05/2019 7  5 - 15 Final   Performed at [facility], [address] [place], [place] [address]   WBC 07/05/2019 40.8* 4.0 - 10.5 K/uL Final   RBC 07/05/2019 3.84* 4.22 - 5.81 MIL/uL Final   Hemoglobin 07/05/2019 10.4* 13.0 - 17.0 g/dL Final   HCT 07/05/2019 33.7* 39.0 - 52.0 % Final   MCV 07/05/2019 87.8  80.0 - 100.0 fL Final   MCH 07/05/2019 27.1  26.0 - 34.0 pg Final   MCHC 07/05/2019 30.9  30.0 - 36.0 g/dL Final   RDW 07/05/2019 20.7* 11.5 - 15.5 % Final   Platelets 07/05/2019 185  150 - 400 K/uL Final   nRBC 07/05/2019 0.4* 0.0 - 0.2 % Final   Neutrophils Relative % 07/05/2019 56  % Final   Neutro Abs 07/05/2019 22.7* 1.7 - 7.7 K/uL Final   Lymphocytes Relative 07/05/2019 17  % Final   Lymphs Abs 07/05/2019 7.0* 0.7 - 4.0 K/uL Final   Monocytes Relative 07/05/2019 5  % Final   Monocytes Absolute 07/05/2019 1.9* 0.1 - 1.0 K/uL Final   Eosinophils Relative 07/05/2019 0  % Final   Eosinophils Absolute 07/05/2019 0.0  0.0 - 0.5 K/uL Final   Basophils Relative 07/05/2019 2  % Final   Basophils Absolute 07/05/2019 1.0* 0.0 - 0.1 K/uL Final   Immature Granulocytes 07/05/2019 20  % Final   Abs Immature Granulocytes 07/05/2019 8.06* 0.00 - 0.07 K/uL Final   Dimorphism 07/05/2019 PRESENT   Final   Polychromasia 07/05/2019 PRESENT   Final   Performed at [facility], [address] [place], [place] [address]   Uric Acid, Serum 07/05/2019 10.0* 3.7 - 8.6 mg/dL Final   Performed at [facility], [address], [place], [place] [address]    Assessment:  KHAMBREL AMSDEN is a 73 y.o.  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 He has had polycythemia dating back to 2013. Hematocrit was 62.1 with a hemoglobin of 19.8 on 03/28/2015. 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 Erythropoietin level was 1.1 (low). 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  He began a phlebotomyprogram on 03/28/2015 to maintain a hematocrit goal of <45.Last phlebotomywason 05/02/2015. He is on  a baby aspirin.  CBC on 11/28/2015 revealed a hematocrit of 39.3, hemoglobin 11.9, platelets 463,000, WBC 32,000 with an ANC of 25,000. Differential included 71% segs, 4% lymphocytes and 17% monocytes. Peripheral smear revealed leukocytosis with predominantly mature neutrophils, increased monocytes and rare blasts (<1%).  Bone marrowon 12/14/2015 revealed a persistent myeloproliferative neoplasm with myelofibrosis and alterations compatible with myelodysplatic progression. Marrow was packed (95-100% cellularity) with pan myelosis, multi-lineage dyspoiesis, and no significant increase in blasts. There was moderate to focally marked reticulin fibrosis (grade 2-3/3). Storage iron was not identified. Flow cytometry revealed non-specific atypical myeloid findings with no increase in blasts. 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 Cytogenetics were normal (46, XY).  Bone marrowon 04/07/2016 at [facility] revealed a hypercellular marrow (>95%) with persistent involvement by myeloid neoplasm with 5% blasts. There was mild reticulin fibrosis. FISH t(9;22) results were normal. 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 Cytogenetics are pending.  Bone marrowon 08/09/2018 revealed a hypercellular bone marrow (>95%) with persistent involvement by myeloid neoplasm with <1% blasts by manual touch preparation differential. There was marked reticulin fibrosis with focal collagen deposition. Cytogenetics were normal (46, XY). - Myeloid mutation panel study is pending.  He was briefly on hydroxyurea500 mg a day (12/05/2015 - 12/19/2015 and 06/25/2016 - 07/03/2016). Platelet count increased to 1.18 million and white count to 54,800 without increased blasts on 06/25/2016. CBC on 07/02/2016 revealed a platelets of 429,000 with a white count 30,600. He began allopurinolon  12/19/2015. He began Jakafi 20 mg BID on 05/28/2016. He has been onJakafi25 mg BID since09/12/2016.  LDHis followed: 466 on 01/02/2016, 596 on 05/23/2016, 663 on 07/30/2016, 522 on 09/24/2016, 638 on 01/05/2017, 625 on 02/25/2017, 1144 on 06/10/2017, 720 on 06/17/2017, 728 on 06/24/2017, and 724 on 01/06/2018. Triglycerideswere 183 (<150) on 08/20/2016, 180 on 01/05/2017, 312 on 06/17/2017, 165 on 11/25/2017, 113 on 02/17/2018.  He has urinary retention. Renal ultrasoundon 07/28/2016 revealed bilateral hydronephrosis (right >left) and a large postvoid residual (2 liters) suggesting bladder dysfunction or outlet obstruction. He underwent temporary Foley catheter placement. He performs I/O self catheterizations. He is on Flomax and finasteride. PSAwas 2.02 on 07/09/2016.  He is followed by GI (Dr. Vira Agar) for a history of polypsand Crohn's disease.He had an unremarkable colonoscopy in 03/2017.AbdomenandpelvisCTon 04/03/2019 revealed mild wall thickening of portions of a loop of small bowel within the pelvis with intervening areas of mildly dilated small bowel,consistent with active Crohn's disease. No evidence of an abscess or fistula. No other evidence of bowel inflammation.There was splenomegaly(16.7 x 7.7 x 15.6 cm), developed since12/11/2011.There was mild bladder wall thickeningandenlargement of the prostate. Chronic bladder outlet obstructionwas felt to bethe etiology of bladder wall thickening.  He has chronicrenal insufficiency(Cr 1.85; CrCl 36 ml/min). SPEP on 09/23/2016 was negative. Spot urine revealed 17.4% of 27.3 mg/dL.of a monoclonal protein. 24 hour urine on 10/16/2016 revealed no monoclonal protein. Renal function transiently decreased in 06/2017 after Bactrim. He is followed by nephrology and urology.  He has a history of epistaxis. Normal studies included: PT, PTT, von Willebrand panel, and platelet function assay (  PFA).  He has right  foot drop.  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 There was no non-contrast MRI evidence of brain metastasis. There was only mild for age nonspecific cerebral white signal changes.  Lumbar spine MRI on 06/09/2019 showed diffusely abnormal marrow signal in the visible skeleton, as seen on the brain MRI. There was no pathologic fracture and no extraosseous extension of tumor on the lumbar spine or visible sacrum. In conjunction with the splenomegaly by CT in July, marrow infiltration by leukemia/lymphoma may be the most likely etiology. Differential considerations included multiple myeloma and metastatic disease unknown primary. The involvement of the pelvis should be amenable to bone marrow biopsy. There was superimposed mild for age lumbar spine degeneration with no spinal stenosis. There was mild degenerative L4 and L5 neural foraminal stenosis.   Symptomatically, he feels better today.  He notes resolution of his low grade fever.  Fatigue has improved.  Exam reveals no adenopathy or hepatosplenomegaly.  Plan: 1.   Labs today: CBC with diff, uric acid. 2.   Peripheral smear for path review. 2. Polycythemia rubra vera Clinically, he has recently felt fatigued with a low grade temperature.  Symptoms appear to be improving with resolution of fever and improved energy level.  He appears to be near his baseline health.             Hematocrit 33.7.  Hemoglobin 10.4.  Platelets 185,000.  White count 40,800 with an ANC of 18,400.                           Peripheral smear reveals leukocytosis.  There is left shift with myelocytes and rare blasts (< 5%).   Neutrophils have dysplastic features including Pelger Huet change and hypogranulation.  Rare teardrop. WBC was elevated at 72,900 during Crohn's flare.  WBC improved to 22,000 - 27,500 in 04/2019 and 05/2019.  WBC has again increased with no obvious infection.             Continue  Jakafi.             Patient has been out of his allopurinol.                           Uric acid is 10 (elevated) today.             Reinstitute allopurinol 150 mg a day secondary to creatinine 1.69 (CrCl 41.9 ml/min)/             Continue close surveillance. 3. Iron deficiency Hematocrit 33.7. Hemoglobin 10.4. MCV 87.8.  Ferritin was 88 on 06/02/2019 (prior value 163 during Crohn's flare)             Continue oral iron 3 days/week (Mondays, Wednesdays and Fridays). Continue to monitor. 4.Crohn's disease Patient is followed by the GI [facility]. Patient is off steroids.  Bowel movements are semi-regular. 5.Right foot drop             Etiology remains unclear.  Foot drop is improving.  Patient is s/p head MRI and lumbar spine MRI (results noted above).  No clear lesion accounting for foot drop.  Patient is unsure if he had an EMG.             Follow-up with Dr. Manuella Ghazi, neurologist. 6.   Abnormal head and lumbar spine MRI  Concern for evidence of diffuse osseous metastatic  disease vs abnormal marrow signal from underlying myeloproliferative disorder.  Last bone marrow on 08/09/2018.  No correlating imaging at that time.  Consider repeat bone marrow aspirate and biopsy.  Patient has follow-up with Dr. Adriana Simas on 07/11/2019- phone follow-up. 7.   RTC on 08/25/2019 for MD assessment and labs (CBC with diff, CMP, LDH, uric acid, ferritin).  I discussed the assessment and treatment plan with the patient.  The patient was provided an opportunity to ask questions and all were answered.  The patient agreed with the plan and demonstrated an understanding of the instructions.  The patient was advised to call back if the symptoms worsen or if the condition fails to improve as anticipated.  I provided 33 minutes of face-to-face time during this this encounter and > 50% was spent counseling as documented under my assessment  and plan.    Lequita Asal, MD, PhD    07/05/2019, 3:35 PM  I, Selena Batten, am acting as scribe for [employer]. Mike Gip, MD, PhD.  I, Susanna Benge C. Mike Gip, MD, have reviewed the above documentation for accuracy and completeness, and I agree with the above.

## 2019-07-04 NOTE — Telephone Encounter (Signed)
-----   Message from Secundino Ginger sent at 07/04/2019  8:20 AM EDT ----- Contact: [place]' wife called and said Ruben [name] had a rough w/e. He was extremely tired and lethargic. She asked that he be seen and lab work. Or wife cell phone [phone]

## 2019-07-05 ENCOUNTER — Ambulatory Visit: Payer: Medicare Other | Admitting: Physical Therapy

## 2019-07-05 ENCOUNTER — Other Ambulatory Visit: Payer: Self-pay

## 2019-07-05 ENCOUNTER — Inpatient Hospital Stay: Payer: Medicare Other

## 2019-07-05 ENCOUNTER — Inpatient Hospital Stay: Payer: Medicare Other | Attending: Hematology and Oncology | Admitting: Hematology and Oncology

## 2019-07-05 VITALS — BP 111/75 | HR 71 | Temp 97.8°F | Resp 16 | Wt 165.3 lb

## 2019-07-05 DIAGNOSIS — Z807 Family history of other malignant neoplasms of lymphoid, hematopoietic and related tissues: Secondary | ICD-10-CM | POA: Insufficient documentation

## 2019-07-05 DIAGNOSIS — R269 Unspecified abnormalities of gait and mobility: Secondary | ICD-10-CM | POA: Diagnosis not present

## 2019-07-05 DIAGNOSIS — Z8042 Family history of malignant neoplasm of prostate: Secondary | ICD-10-CM | POA: Insufficient documentation

## 2019-07-05 DIAGNOSIS — D72829 Elevated white blood cell count, unspecified: Secondary | ICD-10-CM | POA: Insufficient documentation

## 2019-07-05 DIAGNOSIS — K5090 Crohn's disease, unspecified, without complications: Secondary | ICD-10-CM | POA: Insufficient documentation

## 2019-07-05 DIAGNOSIS — M21371 Foot drop, right foot: Secondary | ICD-10-CM | POA: Diagnosis not present

## 2019-07-05 DIAGNOSIS — E790 Hyperuricemia without signs of inflammatory arthritis and tophaceous disease: Secondary | ICD-10-CM | POA: Diagnosis not present

## 2019-07-05 DIAGNOSIS — R29898 Other symptoms and signs involving the musculoskeletal system: Secondary | ICD-10-CM

## 2019-07-05 DIAGNOSIS — N189 Chronic kidney disease, unspecified: Secondary | ICD-10-CM | POA: Diagnosis not present

## 2019-07-05 DIAGNOSIS — D45 Polycythemia vera: Secondary | ICD-10-CM

## 2019-07-05 DIAGNOSIS — Z79899 Other long term (current) drug therapy: Secondary | ICD-10-CM | POA: Diagnosis not present

## 2019-07-05 DIAGNOSIS — M25661 Stiffness of right knee, not elsewhere classified: Secondary | ICD-10-CM | POA: Diagnosis not present

## 2019-07-05 DIAGNOSIS — N401 Enlarged prostate with lower urinary tract symptoms: Secondary | ICD-10-CM | POA: Insufficient documentation

## 2019-07-05 DIAGNOSIS — F17210 Nicotine dependence, cigarettes, uncomplicated: Secondary | ICD-10-CM | POA: Diagnosis not present

## 2019-07-05 DIAGNOSIS — D500 Iron deficiency anemia secondary to blood loss (chronic): Secondary | ICD-10-CM | POA: Diagnosis not present

## 2019-07-05 DIAGNOSIS — Z803 Family history of malignant neoplasm of breast: Secondary | ICD-10-CM | POA: Diagnosis not present

## 2019-07-05 DIAGNOSIS — Z800 Family history of malignant neoplasm of digestive organs: Secondary | ICD-10-CM | POA: Diagnosis not present

## 2019-07-05 DIAGNOSIS — R930 Abnormal findings on diagnostic imaging of skull and head, not elsewhere classified: Secondary | ICD-10-CM | POA: Diagnosis not present

## 2019-07-05 DIAGNOSIS — Z7189 Other specified counseling: Secondary | ICD-10-CM

## 2019-07-05 DIAGNOSIS — R5383 Other fatigue: Secondary | ICD-10-CM

## 2019-07-05 LAB — COMPREHENSIVE METABOLIC PANEL
ALT: 14 U/L (ref 0–44)
AST: 33 U/L (ref 15–41)
Albumin: 4 g/dL (ref 3.5–5.0)
Alkaline Phosphatase: 70 U/L (ref 38–126)
Anion gap: 7 (ref 5–15)
BUN: 35 mg/dL — ABNORMAL HIGH (ref 8–23)
CO2: 23 mmol/L (ref 22–32)
Calcium: 9.1 mg/dL (ref 8.9–10.3)
Chloride: 103 mmol/L (ref 98–111)
Creatinine, Ser: 1.69 mg/dL — ABNORMAL HIGH (ref 0.61–1.24)
GFR calc Af Amer: 46 mL/min — ABNORMAL LOW (ref >60)
GFR calc non Af Amer: 40 mL/min — ABNORMAL LOW (ref >60)
Glucose, Bld: 99 mg/dL (ref 70–99)
Potassium: 4.7 mmol/L (ref 3.5–5.1)
Sodium: 133 mmol/L — ABNORMAL LOW (ref 135–145)
Total Bilirubin: 1 mg/dL (ref 0.3–1.2)
Total Protein: 8.3 g/dL — ABNORMAL HIGH (ref 6.5–8.1)

## 2019-07-05 LAB — CBC WITH DIFFERENTIAL/PLATELET
Abs Immature Granulocytes: 8.06 10*3/uL — ABNORMAL HIGH (ref 0.00–0.07)
Basophils Absolute: 1 10*3/uL — ABNORMAL HIGH (ref 0.0–0.1)
Basophils Relative: 2 %
Eosinophils Absolute: 0 10*3/uL (ref 0.0–0.5)
Eosinophils Relative: 0 %
HCT: 33.7 % — ABNORMAL LOW (ref 39.0–52.0)
Hemoglobin: 10.4 g/dL — ABNORMAL LOW (ref 13.0–17.0)
Immature Granulocytes: 20 %
Lymphocytes Relative: 17 %
Lymphs Abs: 7 10*3/uL — ABNORMAL HIGH (ref 0.7–4.0)
MCH: 27.1 pg (ref 26.0–34.0)
MCHC: 30.9 g/dL (ref 30.0–36.0)
MCV: 87.8 fL (ref 80.0–100.0)
Monocytes Absolute: 1.9 10*3/uL — ABNORMAL HIGH (ref 0.1–1.0)
Monocytes Relative: 5 %
Neutro Abs: 22.7 10*3/uL — ABNORMAL HIGH (ref 1.7–7.7)
Neutrophils Relative %: 56 %
Platelets: 185 10*3/uL (ref 150–400)
RBC: 3.84 MIL/uL — ABNORMAL LOW (ref 4.22–5.81)
RDW: 20.7 % — ABNORMAL HIGH (ref 11.5–15.5)
WBC: 40.8 10*3/uL — ABNORMAL HIGH (ref 4.0–10.5)
nRBC: 0.4 % — ABNORMAL HIGH (ref 0.0–0.2)

## 2019-07-05 LAB — URIC ACID: Uric Acid, Serum: 10 mg/dL — ABNORMAL HIGH (ref 3.7–8.6)

## 2019-07-05 MED ORDER — ALLOPURINOL 300 MG PO TABS: 150.0000 mg | ORAL_TABLET | Freq: Every day | ORAL | 1 refills | Status: DC

## 2019-07-05 NOTE — Patient Instructions (Signed)
Access Code: [email or web address]  URL: [email or web address]  Date: 07/05/2019  Prepared by: Dorcas Carrow   Exercises  Single Leg Heel Raise with Counter Support - 20 reps - 2 sets - 1x daily - 4x weekly  Standing Gastroc Stretch on Step with Counter Support - 20 reps - 2 sets - 5 second hold - 1x daily - 4x weekly  Heel Toe Raises with Unilateral Counter Support - 20 reps - 2 sets - 1x daily - 4x weekly  Ankle Alphabet in Elevation - 1 reps - 2 sets - 1x daily - 7x weekly  Squat with Chair Touch - 12 reps - 2 sets - 1x daily - 4x weekly  Standing Gastroc Stretch on Step - 1 reps - 2 sets - 30 hold - 1x daily - 7x weekly  Seated Ankle Dorsiflexion with Resistance - 20 reps - 2 sets - 1x daily - 4x weekly  Seated Ankle Eversion with Resistance - 20 reps - 2 sets - 1x daily - 4x weekly

## 2019-07-05 NOTE — Therapy (Signed)
[facility] [address] [place], [place], [address] Phone: [phone]   Fax:  [phone]  Physical Therapy Treatment  Patient Details  Name: Ruben [name] MRN: [id number] Date of Birth: [birth year] Referring Provider (PT): Hemang K. Manuella Ghazi, MD   Encounter Date: 07/05/2019  PT End of Session - 07/06/19 0821    Visit Number  2    Number of Visits  8    Date for PT Re-Evaluation  07/26/19    Authorization - Visit Number  2    Authorization - Number of Visits  10    PT Start Time  0831    PT Stop Time  0917    PT Time Calculation (min)  46 min    Equipment Utilized During Treatment  Gait belt    Activity Tolerance  Patient tolerated treatment well    Behavior During Therapy  WFL for tasks assessed/performed       Past Medical History:  Diagnosis Date  . Blood dyscrasia   . BPH (benign prostatic hyperplasia)   . Cancer ([place])    SKIN/ POLYCYTHEMIA VERA  . Chronic kidney disease    RENAL INSUFF (40%)  . Crohn disease ([place])   . Crohn's disease ([place])   . Glaucoma   . Glaucoma   . Gout   . History of chicken pox   . Myelofibrosis ([place])   . Nosebleed   . RBBB   . Right bundle branch block     Past Surgical History:  Procedure Laterality Date  . CATARACT EXTRACTION W/PHACO Left 07/13/2018   Procedure: CATARACT EXTRACTION PHACO AND INTRAOCULAR LENS PLACEMENT (IOC);  Surgeon: [name], MD;  Location: [facility] ORS;  Service: Ophthalmology;  Laterality: Left;  [place] 00:39  CDE 5.14 Fluid pack lot # [id number]  . CATARACT EXTRACTION W/PHACO Right 08/03/2018   Procedure: CATARACT EXTRACTION PHACO AND INTRAOCULAR LENS PLACEMENT (IOC);  Surgeon: [name], MD;  Location: [facility] ORS;  Service: Ophthalmology;  Laterality: Right;  [place] 00:43.4 CDE 5.23 Fluid pack Lot # [id number]  . COLONOSCOPY WITH PROPOFOL N/A 03/27/2017   Procedure: COLONOSCOPY WITH PROPOFOL;  Surgeon: [name], MD;  Location: [facility] ENDOSCOPY;   Service: Endoscopy;  Laterality: N/A;  . MOHS SURGERY     EAR  . PROSTATE SURGERY  2008   Prostate Biopsy in [place] to elevated PSA.  per patient normal  . Skin Lesion Basal cell removed    . wart removal     from eyelid    There were no vitals filed for this visit.  Subjective Assessment - 07/06/19 0810    Subjective  Pt reports no pain today.  Pt reports feeling fatigued this weekend and that he may have come down with a cold - no reports of fever or COVID sx.  Pt is feeling better today, but is still feeling somewhat fatigued.    Pertinent History  Pt presents to therapy with dx of right foot drop and R sided weakness.  Onset was in September and has been improving.  Pt reports frequent falls initially that has decreased over time with few in the last couple of weeks (no significant injuries from previous falls).  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  Pt somewhat active; walks a mile with wife everyday and does yardwork.  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    Limitations  Walking;House hold activities  Patient Stated Goals  Decrease falls, improve balance, safety with ADLs    Currently in Pain?  No/denies         Supine DF ROM: R: 9, L: 10  Seated DF AROM: R significantly limited in comparison to L  Therapeutic Exercise: Supine AAROM DF Supine calf stretching - 3x30sec B Supine DF with RTB Supine eversion with RTB  Neuro: Gait in hallway - x4 with B LE ER (R>L) and consistent heel strike with excessive supination at heel strike on R Marching in hallway - x4 Marching with alternated UE/ LE touches - x2 Gait with head turns in hallway - x4 with no marked gait deviations     PT Education - 07/06/19 0817    Education Details  Pt educated on band exercises    Person(s) Educated  Patient    Methods  Explanation;Demonstration;Verbal cues    Comprehension  Verbalized understanding;Returned  demonstration       PT Short Term Goals - 06/30/19 1926      PT SHORT TERM GOAL #1   Title  Pt will present with no significant postural limitations to demonstrate postural control.    Baseline  Pt presents with rounded shoulders/ forward head posture 06/28/2019    Time  2    Period  Weeks    Status  New    Target Date  07/12/19        PT Long Term Goals - 06/30/19 1929      PT LONG TERM GOAL #1   Title  Pt will demonstrate FOTO score of 81 to indicate improved functional mobility.    Baseline  Pt FOTO score 70 06/28/2019    Time  4    Period  Weeks    Status  New    Target Date  07/26/19      PT LONG TERM GOAL #2   Title  Pt R LE MMT assessment will reveal 5/5 grossly to indicate increased strength for functional activity (ie. yardwork).    Baseline  Pt R LE hip/ knee flexion 4+/5, ankle dorsiflexion 3/5 06/28/2019    Time  4    Period  Weeks    Status  New    Target Date  07/26/19      PT LONG TERM GOAL #3   Title  Pt will demonstrate heel raise test with 25 full ROM reps B to demonstrate increased LE endurance.    Baseline  Heel raise test 20 reps with decreased ROM 06/28/2019    Time  4    Period  Weeks    Status  New    Target Date  07/26/19      PT LONG TERM GOAL #4   Title  Pt will report no falls in 3 weeks to demonstrate increased balance and safety at home.    Baseline  Pt reports multiple falls in the last couple of weeks 06/28/2019    Time  4    Period  Weeks    Status  New    Target Date  07/26/19         Plan - 07/06/19 5374    Clinical Impression Statement  Pt gait reveals consistent heel strike with B LE ER (R>L) and excessive supination at heel strike on R.  Pt demonstrates appropriate balance and coordination with gait activities; reports his issue is not balance, but his ability to recover from balance disturbances.  Pt DF PROM is equal bilaterally, but AROM is limited significantly in  R>L.  Pt able to perform R DF in limited range with  resistance.  Pt will benefit from skilled therapy to improve DF AROM, increase LE strength, and improve dynamic balance.    Personal Factors and Comorbidities  Age;Comorbidity 1    Comorbidities  Bone marrow neofibrosis    Examination-Participation Restrictions  Yard Work    Stability/Clinical Decision Making  Stable/Uncomplicated    Clinical Decision Making  Low    Rehab Potential  Good    PT Frequency  2x / week    PT Duration  4 weeks    PT Treatment/Interventions  ADLs/Self Care Home Management;Electrical Stimulation;Moist Heat;Gait training;Stair training;Functional mobility training;Neuromuscular re-education;Balance training;Therapeutic exercise;Therapeutic activities;Patient/family education;Manual techniques;Passive range of motion    PT Next Visit Plan  LE strengthening (DF emphasis - isometrics/ active), dynamic balance    PT Home Exercise Plan  [id number]    Consulted and Agree with Plan of Care  Patient       Patient will benefit from skilled therapeutic intervention in order to improve the following deficits and impairments:  Abnormal gait, Decreased balance, Decreased endurance, Decreased mobility, Difficulty walking, Hypomobility, Decreased range of motion, Improper body mechanics, Decreased activity tolerance, Decreased coordination, Decreased strength, Impaired flexibility, Postural dysfunction  Visit Diagnosis: Foot drop, right  Weakness of right lower extremity  Decreased range of motion of right lower extremity  Abnormality of gait     Problem List Patient Active Problem List   Diagnosis Date Noted  . Foot drop, right 06/09/2019  . Crohn's disease of small intestine without complication ([place]) 56/21/3086  . Right-sided thoracic back pain 04/06/2019  . Hyponatremia 04/06/2019  . Iron deficiency anemia 11/26/2018  . Goals of care, counseling/discussion 01/06/2018  . 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 High triglycerides 10/14/2017  . 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 Renal insufficiency 07/23/2016  . 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 Right ankle pain 10/31/2015  . Hyperuricemia 09/05/2015  . BPH (benign prostatic hyperplasia) 08/23/2015  . History of tobacco use 08/23/2015  . Glaucoma 08/23/2015  . Enlarged prostate without lower urinary tract symptoms (luts) 08/23/2015  . Elevated prostate specific antigen (PSA) 08/23/2015  . Dermatitis 08/23/2015  . Crohn disease ([place]) 08/23/2015  . Other prurigo 08/23/2015  . Dorsalgia 08/23/2015  . Personal history of nicotine dependence 08/23/2015  . Polycythemia vera ([place]) 04/04/2015  . Secondary polycythemia 03/28/2015  . 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 Right bundle-branch block 05/03/2012   Pura Spice, PT, DPT # [id number] Chinita Greenland, SPT 07/06/2019, 8:37 AM  [facility] [address] [place], [place], [address] Phone: [phone]   Fax:  [phone]  Name: Ruben [name] MRN: [id number] Date of Birth: 12-Jul-1946

## 2019-07-05 NOTE — Progress Notes (Signed)
Patient here due to not feeling well over the weekend. Reports [place] the [place] on [place]. Saturday because extremely fatigued and slept/rested majority of Saturday and Sunday. Reports he is feeling much better today.

## 2019-07-06 ENCOUNTER — Encounter: Payer: Self-pay | Admitting: Physical Therapy

## 2019-07-06 DIAGNOSIS — D0462 Carcinoma in situ of skin of left upper limb, including shoulder: Secondary | ICD-10-CM | POA: Diagnosis not present

## 2019-07-06 DIAGNOSIS — D649 Anemia, unspecified: Secondary | ICD-10-CM | POA: Diagnosis not present

## 2019-07-06 DIAGNOSIS — D72829 Elevated white blood cell count, unspecified: Secondary | ICD-10-CM | POA: Diagnosis not present

## 2019-07-06 DIAGNOSIS — K5000 Crohn's disease of small intestine without complications: Secondary | ICD-10-CM | POA: Diagnosis not present

## 2019-07-06 LAB — PATHOLOGIST SMEAR REVIEW

## 2019-07-07 ENCOUNTER — Encounter: Payer: Self-pay | Admitting: Physical Therapy

## 2019-07-07 ENCOUNTER — Ambulatory Visit: Payer: Medicare Other | Admitting: Physical Therapy

## 2019-07-07 ENCOUNTER — Other Ambulatory Visit: Payer: Self-pay

## 2019-07-07 DIAGNOSIS — R29898 Other symptoms and signs involving the musculoskeletal system: Secondary | ICD-10-CM

## 2019-07-07 DIAGNOSIS — M21371 Foot drop, right foot: Secondary | ICD-10-CM | POA: Diagnosis not present

## 2019-07-07 DIAGNOSIS — M25661 Stiffness of right knee, not elsewhere classified: Secondary | ICD-10-CM | POA: Diagnosis not present

## 2019-07-07 DIAGNOSIS — R269 Unspecified abnormalities of gait and mobility: Secondary | ICD-10-CM | POA: Diagnosis not present

## 2019-07-07 NOTE — Therapy (Signed)
[facility] [address] [place], [place], [address] Phone: [phone]   Fax:  [phone]  Physical Therapy Treatment  Patient Details  Name: Ruben [name] MRN: [id number] Date of Birth: 1946/06/02 Referring Provider (PT): Hemang K. Manuella Ghazi, MD   Encounter Date: 07/07/2019  PT End of Session - 07/07/19 1655    Visit Number  3    Number of Visits  8    Date for PT Re-Evaluation  07/26/19    Authorization - Visit Number  3    Authorization - Number of Visits  10    PT Start Time  0623    PT Stop Time  1521    PT Time Calculation (min)  47 min    Equipment Utilized During Treatment  Gait belt    Activity Tolerance  Patient tolerated treatment well    Behavior During Therapy  WFL for tasks assessed/performed       Past Medical History:  Diagnosis Date  . Blood dyscrasia   . BPH (benign prostatic hyperplasia)   . Cancer ([place])    SKIN/ POLYCYTHEMIA VERA  . Chronic kidney disease    RENAL INSUFF (40%)  . Crohn disease ([place])   . Crohn's disease ([place])   . Glaucoma   . Glaucoma   . Gout   . History of chicken pox   . Myelofibrosis ([place])   . Nosebleed   . RBBB   . Right bundle branch block     Past Surgical History:  Procedure Laterality Date  . CATARACT EXTRACTION W/PHACO Left 07/13/2018   Procedure: CATARACT EXTRACTION PHACO AND INTRAOCULAR LENS PLACEMENT (IOC);  Surgeon: [name], MD;  Location: [facility] ORS;  Service: Ophthalmology;  Laterality: Left;  [place] 00:39  CDE 5.14 Fluid pack lot # [id number]  . CATARACT EXTRACTION W/PHACO Right 08/03/2018   Procedure: CATARACT EXTRACTION PHACO AND INTRAOCULAR LENS PLACEMENT (IOC);  Surgeon: [name], MD;  Location: [facility] ORS;  Service: Ophthalmology;  Laterality: Right;  [place] 00:43.4 CDE 5.23 Fluid pack Lot # [id number]  . COLONOSCOPY WITH PROPOFOL N/A 03/27/2017   Procedure: COLONOSCOPY WITH PROPOFOL;  Surgeon: [name], MD;  Location: [facility] ENDOSCOPY;   Service: Endoscopy;  Laterality: N/A;  . MOHS SURGERY     EAR  . PROSTATE SURGERY  2008   Prostate Biopsy in [place] to elevated PSA.  per patient normal  . Skin Lesion Basal cell removed    . wart removal     from eyelid    There were no vitals filed for this visit.  Subjective Assessment - 07/07/19 1430    Subjective  Pt reports no pain today.  Pt reports he has not been feeling as fatigued today.  Pt states he has been performing HEP, but has been having difficulty with squatting.    Pertinent History  Pt presents to therapy with dx of right foot drop and R sided weakness.  Onset was in September and has been improving.  Pt reports frequent falls initially that has decreased over time with few in the last couple of weeks (no significant injuries from previous falls).  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  Pt somewhat active; walks a mile with wife everyday and does yardwork.  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    Limitations  Walking;House hold activities    Patient Stated Goals  Decrease falls, improve balance, safety with ADLs  Currently in Pain?  No/denies          Therapeutic Exercise:  Gait in hallway x4 and in clinic x4 with emphasis on heel strike Supine gastroc stretch - 2x30 sec Supine soleus stretch - 2x30 sec Supine dorsiflexion with RTB - 2x15 Supine eversion with RTB - 2x15  Neuro.  Tandem standing on foam - 2x30 sec B Standing on foam EC - x30 sec Semi tandem standing on foam EC - 2x30 sec B Wobble board: DF/ PF, lateral weight shifting - x1 min each with emphasis on eccentric control Sit<>stand - 2x8 with emphasis on hip hinge     PT Education - 07/07/19 1655    Education Details  Pt educated on sit to stand mechanics.    Person(s) Educated  Patient    Methods  Explanation;Demonstration    Comprehension  Verbalized understanding;Returned demonstration       PT Short  Term Goals - 06/30/19 1926      PT SHORT TERM GOAL #1   Title  Pt will present with no significant postural limitations to demonstrate postural control.    Baseline  Pt presents with rounded shoulders/ forward head posture 06/28/2019    Time  2    Period  Weeks    Status  New    Target Date  07/12/19        PT Long Term Goals - 06/30/19 1929      PT LONG TERM GOAL #1   Title  Pt will demonstrate FOTO score of 81 to indicate improved functional mobility.    Baseline  Pt FOTO score 70 06/28/2019    Time  4    Period  Weeks    Status  New    Target Date  07/26/19      PT LONG TERM GOAL #2   Title  Pt R LE MMT assessment will reveal 5/5 grossly to indicate increased strength for functional activity (ie. yardwork).    Baseline  Pt R LE hip/ knee flexion 4+/5, ankle dorsiflexion 3/5 06/28/2019    Time  4    Period  Weeks    Status  New    Target Date  07/26/19      PT LONG TERM GOAL #3   Title  Pt will demonstrate heel raise test with 25 full ROM reps B to demonstrate increased LE endurance.    Baseline  Heel raise test 20 reps with decreased ROM 06/28/2019    Time  4    Period  Weeks    Status  New    Target Date  07/26/19      PT LONG TERM GOAL #4   Title  Pt will report no falls in 3 weeks to demonstrate increased balance and safety at home.    Baseline  Pt reports multiple falls in the last couple of weeks 06/28/2019    Time  4    Period  Weeks    Status  New    Target Date  07/26/19            Plan - 07/07/19 1704    Clinical Impression Statement  Pt demonstrates gait in clinic with consistent heel strike.  Pt tolerated DF/ eversion RTB resisted exercises well within limited ROM.  Pt demonstrates sufficient ankle strategies/ control with foam balance exercise and good eccentric control with wobble board exercises.  Pt performes sit<>stand with proper technique and reports it is much easier than the squats he was doing for his  HEP; pt educated and HEP updated to reduce  fatigue and ensure proper mechanics with sit<>stand.  Pt will benefit from further skilled therapy to improve DF AROM and improve dynamic balance.    Personal Factors and Comorbidities  Age;Comorbidity 1    Comorbidities  Bone marrow neofibrosis    Examination-Participation Restrictions  Yard Work    Stability/Clinical Decision Making  Stable/Uncomplicated    Clinical Decision Making  Low    Rehab Potential  Good    PT Frequency  2x / week    PT Duration  4 weeks    PT Treatment/Interventions  ADLs/Self Care Home Management;Electrical Stimulation;Moist Heat;Gait training;Stair training;Functional mobility training;Neuromuscular re-education;Balance training;Therapeutic exercise;Therapeutic activities;Patient/family education;Manual techniques;Passive range of motion    PT Next Visit Plan  LE strengthening (DF emphasis - isometrics/ active), dynamic balance, stepping strategies    PT Home Exercise Plan  [id number]: squats removed, sit<>stand added    Consulted and Agree with Plan of Care  Patient       Patient will benefit from skilled therapeutic intervention in order to improve the following deficits and impairments:  Abnormal gait, Decreased balance, Decreased endurance, Decreased mobility, Difficulty walking, Hypomobility, Decreased range of motion, Improper body mechanics, Decreased activity tolerance, Decreased coordination, Decreased strength, Impaired flexibility, Postural dysfunction  Visit Diagnosis: Foot drop, right  Weakness of right lower extremity  Decreased range of motion of right lower extremity  Abnormality of gait     Problem List Patient Active Problem List   Diagnosis Date Noted  . Foot drop, right 06/09/2019  . Crohn's disease of small intestine without complication ([place]) 54/62/7035  . Right-sided thoracic back pain 04/06/2019  . Hyponatremia 04/06/2019  . Iron deficiency anemia 11/26/2018  . Goals of care, counseling/discussion 01/06/2018  . MPN  (myeloproliferative neoplasm) ([place]) 01/04/2018  . High triglycerides 10/14/2017  . 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 Renal insufficiency 07/23/2016  . 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 Right ankle pain 10/31/2015  . Hyperuricemia 09/05/2015  . BPH (benign prostatic hyperplasia) 08/23/2015  . History of tobacco use 08/23/2015  . Glaucoma 08/23/2015  . Enlarged prostate without lower urinary tract symptoms (luts) 08/23/2015  . Elevated prostate specific antigen (PSA) 08/23/2015  . Dermatitis 08/23/2015  . Crohn disease ([place]) 08/23/2015  . Other prurigo 08/23/2015  . Dorsalgia 08/23/2015  . Personal history of nicotine dependence 08/23/2015  . Polycythemia vera ([place]) 04/04/2015  . Secondary polycythemia 03/28/2015  . 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 Right bundle-branch block 05/03/2012   Pura Spice, PT, DPT # [id number] Chinita Greenland, SPT 07/07/2019, 5:11 PM  [place] [facility] [address] [place], [place], [address] Phone: [phone]   Fax:  [phone]  Name: Ruben [name] MRN: [id number] Date of Birth: 1946-06-01

## 2019-07-11 ENCOUNTER — Other Ambulatory Visit: Admit: 2019-07-11 | Discharge: 2019-07-12 | Payer: MEDICARE

## 2019-07-11 ENCOUNTER — Ambulatory Visit: Admit: 2019-07-11 | Discharge: 2019-07-12 | Payer: MEDICARE | Attending: Internal Medicine | Primary: Internal Medicine

## 2019-07-11 ENCOUNTER — Encounter: Payer: Self-pay | Admitting: Hematology and Oncology

## 2019-07-11 DIAGNOSIS — D471 Chronic myeloproliferative disease: Secondary | ICD-10-CM | POA: Diagnosis not present

## 2019-07-11 DIAGNOSIS — N189 Chronic kidney disease, unspecified: Secondary | ICD-10-CM | POA: Diagnosis not present

## 2019-07-11 DIAGNOSIS — D509 Iron deficiency anemia, unspecified: Secondary | ICD-10-CM | POA: Diagnosis not present

## 2019-07-11 DIAGNOSIS — D7581 Myelofibrosis: Secondary | ICD-10-CM | POA: Diagnosis not present

## 2019-07-12 ENCOUNTER — Encounter: Payer: Self-pay | Admitting: Physical Therapy

## 2019-07-12 ENCOUNTER — Other Ambulatory Visit: Payer: Self-pay

## 2019-07-12 ENCOUNTER — Ambulatory Visit: Payer: Medicare Other | Admitting: Physical Therapy

## 2019-07-12 DIAGNOSIS — R29898 Other symptoms and signs involving the musculoskeletal system: Secondary | ICD-10-CM

## 2019-07-12 DIAGNOSIS — R269 Unspecified abnormalities of gait and mobility: Secondary | ICD-10-CM

## 2019-07-12 DIAGNOSIS — M21371 Foot drop, right foot: Secondary | ICD-10-CM | POA: Diagnosis not present

## 2019-07-12 DIAGNOSIS — M25661 Stiffness of right knee, not elsewhere classified: Secondary | ICD-10-CM

## 2019-07-12 NOTE — Therapy (Signed)
[facility] [address] [place], [place], [address] Phone: [phone]   Fax:  [phone]  Physical Therapy Treatment  Patient Details  Name: Ruben [name] MRN: [id number] Date of Birth: 12-24-1945 Referring Provider (PT): Hemang K. Manuella Ghazi, MD   Encounter Date: 07/12/2019  PT End of Session - 07/12/19 1757    Visit Number  4    Number of Visits  8    Date for PT Re-Evaluation  07/26/19    Authorization - Visit Number  4    Authorization - Number of Visits  10    PT Start Time  0845    PT Stop Time  0935    PT Time Calculation (min)  50 min    Equipment Utilized During Treatment  Gait belt    Activity Tolerance  Patient tolerated treatment well    Behavior During Therapy  WFL for tasks assessed/performed       Past Medical History:  Diagnosis Date  . Blood dyscrasia   . BPH (benign prostatic hyperplasia)   . Cancer ([place])    SKIN/ POLYCYTHEMIA VERA  . Chronic kidney disease    RENAL INSUFF (40%)  . Crohn disease ([place])   . Crohn's disease ([place])   . Glaucoma   . Glaucoma   . Gout   . History of chicken pox   . Myelofibrosis ([place])   . Nosebleed   . RBBB   . Right bundle branch block     Past Surgical History:  Procedure Laterality Date  . CATARACT EXTRACTION W/PHACO Left 07/13/2018   Procedure: CATARACT EXTRACTION PHACO AND INTRAOCULAR LENS PLACEMENT (IOC);  Surgeon: [name], MD;  Location: [facility] ORS;  Service: Ophthalmology;  Laterality: Left;  [place] 00:39  CDE 5.14 Fluid pack lot # [id number]  . CATARACT EXTRACTION W/PHACO Right 08/03/2018   Procedure: CATARACT EXTRACTION PHACO AND INTRAOCULAR LENS PLACEMENT (IOC);  Surgeon: [name], MD;  Location: [facility] ORS;  Service: Ophthalmology;  Laterality: Right;  [place] 00:43.4 CDE 5.23 Fluid pack Lot # [id number]  . COLONOSCOPY WITH PROPOFOL N/A 03/27/2017   Procedure: COLONOSCOPY WITH PROPOFOL;  Surgeon: [name], MD;  Location: [facility] ENDOSCOPY;   Service: Endoscopy;  Laterality: N/A;  . MOHS SURGERY     EAR  . PROSTATE SURGERY  2008   Prostate Biopsy in [place] to elevated PSA.  per patient normal  . Skin Lesion Basal cell removed    . wart removal     from eyelid    There were no vitals filed for this visit.  Subjective Assessment - 07/12/19 1746    Subjective  Pt reports no pain or fatigue today.  Pt is having bone marrow biopsy on Thursday so cancelled upcoming appointment.    Pertinent History  Pt presents to therapy with dx of right foot drop and R sided weakness.  Onset was in September and has been improving.  Pt reports frequent falls initially that has decreased over time with few in the last couple of weeks (no significant injuries from previous falls).  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  Pt somewhat active; walks a mile with wife everyday and does yardwork.  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    Limitations  Walking;House hold activities    Patient Stated Goals  Decrease falls, improve balance, safety with ADLs        Therapeutic Exercise:  Nustep  L3 x10 min Long sitting AAROM with calf stretch - 2x30 sec hold Long sitting RTB dorsiflexion - 2x10 Long sitting RTB eversion - 2x10 Discussed HEP  Neuro:  Tandem walking on stable surface in // bars forward/ backward - x4 Tandem walking on foam in // bars - x4 Hallway activities: hip marching/ alt. UE and LE touches/ braiding L/R (no UE assist)      PT Education - 07/12/19 1756    Education Details  Pt educated on gait mechanics    Person(s) Educated  Patient    Methods  Explanation;Demonstration    Comprehension  Verbalized understanding;Returned demonstration       PT Short Term Goals - 06/30/19 1926      PT SHORT TERM GOAL #1   Title  Pt will present with no significant postural limitations to demonstrate postural control.    Baseline  Pt presents with rounded  shoulders/ forward head posture 06/28/2019    Time  2    Period  Weeks    Status  New    Target Date  07/12/19        PT Long Term Goals - 06/30/19 1929      PT LONG TERM GOAL #1   Title  Pt will demonstrate FOTO score of 81 to indicate improved functional mobility.    Baseline  Pt FOTO score 70 06/28/2019    Time  4    Period  Weeks    Status  New    Target Date  07/26/19      PT LONG TERM GOAL #2   Title  Pt R LE MMT assessment will reveal 5/5 grossly to indicate increased strength for functional activity (ie. yardwork).    Baseline  Pt R LE hip/ knee flexion 4+/5, ankle dorsiflexion 3/5 06/28/2019    Time  4    Period  Weeks    Status  New    Target Date  07/26/19      PT LONG TERM GOAL #3   Title  Pt will demonstrate heel raise test with 25 full ROM reps B to demonstrate increased LE endurance.    Baseline  Heel raise test 20 reps with decreased ROM 06/28/2019    Time  4    Period  Weeks    Status  New    Target Date  07/26/19      PT LONG TERM GOAL #4   Title  Pt will report no falls in 3 weeks to demonstrate increased balance and safety at home.    Baseline  Pt reports multiple falls in the last couple of weeks 06/28/2019    Time  4    Period  Weeks    Status  New    Target Date  07/26/19            Plan - 07/12/19 1757    Clinical Impression Statement  Pt demonstrates consistent heel strike with gait in clinic; pt reports feeling more confident with walking.  Pt has sufficient ankle strategies/ control with foam balance and tandem gait activities.  Pt will benefit from further skilled therapy to improve DF AROM and improve dynamic balance/ gait.    Personal Factors and Comorbidities  Age;Comorbidity 1    Comorbidities  Bone marrow neofibrosis    Examination-Participation Restrictions  Yard Work    Stability/Clinical Decision Making  Stable/Uncomplicated    Clinical Decision Making  Low    Rehab Potential  Good    PT Frequency  2x /  week    PT Duration  4  weeks    PT Treatment/Interventions  ADLs/Self Care Home Management;Electrical Stimulation;Moist Heat;Gait training;Stair training;Functional mobility training;Neuromuscular re-education;Balance training;Therapeutic exercise;Therapeutic activities;Patient/family education;Manual techniques;Passive range of motion    PT Next Visit Plan  LE strengthening (DF emphasis - isometrics/ active), dynamic balance, stepping strategies    PT Home Exercise Plan  [id number]: squats removed, sit<>stand added    Consulted and Agree with Plan of Care  Patient       Patient will benefit from skilled therapeutic intervention in order to improve the following deficits and impairments:  Abnormal gait, Decreased balance, Decreased endurance, Decreased mobility, Difficulty walking, Hypomobility, Decreased range of motion, Improper body mechanics, Decreased activity tolerance, Decreased coordination, Decreased strength, Impaired flexibility, Postural dysfunction  Visit Diagnosis: Foot drop, right  Weakness of right lower extremity  Decreased range of motion of right lower extremity  Abnormality of gait     Problem List Patient Active Problem List   Diagnosis Date Noted  . Foot drop, right 06/09/2019  . Crohn's disease of small intestine without complication ([place]) 27/25/3664  . Right-sided thoracic back pain 04/06/2019  . Hyponatremia 04/06/2019  . Iron deficiency anemia 11/26/2018  . Goals of care, counseling/discussion 01/06/2018  . 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 High triglycerides 10/14/2017  . 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 Renal insufficiency 07/23/2016  . 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 Right ankle pain 10/31/2015  . Hyperuricemia 09/05/2015  . BPH (benign prostatic hyperplasia) 08/23/2015  . History of tobacco use 08/23/2015  . Glaucoma 08/23/2015  . Enlarged prostate without lower urinary tract symptoms (luts) 08/23/2015  . Elevated  prostate specific antigen (PSA) 08/23/2015  . Dermatitis 08/23/2015  . Crohn disease ([place]) 08/23/2015  . Other prurigo 08/23/2015  . Dorsalgia 08/23/2015  . Personal history of nicotine dependence 08/23/2015  . Polycythemia vera ([place]) 04/04/2015  . Secondary polycythemia 03/28/2015  . 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 Right bundle-branch block 05/03/2012   Pura Spice, PT, DPT # [id number] Chinita [place]land, SPT 07/12/2019, 6:00 PM  [facility] [address] [place], [place], [address] Phone: [phone]   Fax:  [phone]  Name: Ruben [name] MRN: [id number] Date of Birth: 1946/03/03

## 2019-07-14 ENCOUNTER — Other Ambulatory Visit: Admit: 2019-07-14 | Discharge: 2019-07-15 | Payer: MEDICARE

## 2019-07-14 ENCOUNTER — Ambulatory Visit: Admit: 2019-07-14 | Discharge: 2019-07-15 | Payer: MEDICARE | Attending: Adult Health | Primary: Adult Health

## 2019-07-14 ENCOUNTER — Other Ambulatory Visit: Payer: Medicare Other

## 2019-07-14 ENCOUNTER — Encounter: Payer: Medicare Other | Admitting: Physical Therapy

## 2019-07-14 DIAGNOSIS — D471 Chronic myeloproliferative disease: Secondary | ICD-10-CM | POA: Diagnosis not present

## 2019-07-14 DIAGNOSIS — D45 Polycythemia vera: Secondary | ICD-10-CM | POA: Diagnosis not present

## 2019-07-18 ENCOUNTER — Other Ambulatory Visit: Payer: Self-pay | Admitting: Hematology and Oncology

## 2019-07-18 DIAGNOSIS — D45 Polycythemia vera: Secondary | ICD-10-CM

## 2019-07-18 MED ORDER — RUXOLITINIB PHOSPHATE 20 MG PO TABS: 20.0000 mg | ORAL_TABLET | Freq: Two times a day (BID) | ORAL | 0 refills | Status: DC

## 2019-07-19 ENCOUNTER — Ambulatory Visit: Payer: Medicare Other | Admitting: Physical Therapy

## 2019-07-19 ENCOUNTER — Encounter: Payer: Self-pay | Admitting: Physical Therapy

## 2019-07-19 ENCOUNTER — Other Ambulatory Visit: Payer: Self-pay

## 2019-07-19 DIAGNOSIS — R29898 Other symptoms and signs involving the musculoskeletal system: Secondary | ICD-10-CM

## 2019-07-19 DIAGNOSIS — M21371 Foot drop, right foot: Secondary | ICD-10-CM

## 2019-07-19 DIAGNOSIS — M25661 Stiffness of right knee, not elsewhere classified: Secondary | ICD-10-CM

## 2019-07-19 DIAGNOSIS — R269 Unspecified abnormalities of gait and mobility: Secondary | ICD-10-CM | POA: Diagnosis not present

## 2019-07-19 NOTE — Therapy (Signed)
[facility] [address] [place], [place], [address] Phone: [phone]   Fax:  [phone]  Physical Therapy Treatment  Patient Details  Name: Ruben [name] MRN: [id number] Date of Birth: 1945-12-16 Referring Provider (PT): Hemang K. Manuella Ghazi, MD   Encounter Date: 07/19/2019  PT End of Session - 07/19/19 1055    Visit Number  5    Number of Visits  8    Date for PT Re-Evaluation  07/26/19    Authorization - Visit Number  5    Authorization - Number of Visits  10    PT Start Time  1000    PT Stop Time  1048    PT Time Calculation (min)  48 min    Equipment Utilized During Treatment  Gait belt    Activity Tolerance  Patient tolerated treatment well    Behavior During Therapy  WFL for tasks assessed/performed       Past Medical History:  Diagnosis Date  . Blood dyscrasia   . BPH (benign prostatic hyperplasia)   . Cancer ([place])    SKIN/ POLYCYTHEMIA VERA  . Chronic kidney disease    RENAL INSUFF (40%)  . Crohn disease ([place])   . Crohn's disease ([place])   . Glaucoma   . Glaucoma   . Gout   . History of chicken pox   . Myelofibrosis ([place])   . Nosebleed   . RBBB   . Right bundle branch block     Past Surgical History:  Procedure Laterality Date  . CATARACT EXTRACTION W/PHACO Left 07/13/2018   Procedure: CATARACT EXTRACTION PHACO AND INTRAOCULAR LENS PLACEMENT (IOC);  Surgeon: [name], MD;  Location: [facility] ORS;  Service: Ophthalmology;  Laterality: Left;  [place] 00:39  CDE 5.14 Fluid pack lot # [id number]  . CATARACT EXTRACTION W/PHACO Right 08/03/2018   Procedure: CATARACT EXTRACTION PHACO AND INTRAOCULAR LENS PLACEMENT (IOC);  Surgeon: [name], MD;  Location: [facility] ORS;  Service: Ophthalmology;  Laterality: Right;  [place] 00:43.4 CDE 5.23 Fluid pack Lot # [id number]  . COLONOSCOPY WITH PROPOFOL N/A 03/27/2017   Procedure: COLONOSCOPY WITH PROPOFOL;  Surgeon: [name], MD;  Location: [facility] ENDOSCOPY;   Service: Endoscopy;  Laterality: N/A;  . MOHS SURGERY     EAR  . PROSTATE SURGERY  2008   Prostate Biopsy in [place] to elevated PSA.  per patient normal  . Skin Lesion Basal cell removed    . wart removal     from eyelid    There were no vitals filed for this visit.  Subjective Assessment - 07/19/19 1001    Subjective  Pt reports no pain today and that he is feeling better after his bone marrow biopsy.  Pt reports performing HEP.  Pt reports catching his toe on an unlevel surface this weekend, but was able to recover.    Pertinent History  Pt presents to therapy with dx of right foot drop and R sided weakness.  Onset was in September and has been improving.  Pt reports frequent falls initially that has decreased over time with few in the last couple of weeks (no significant injuries from previous falls).  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  Pt somewhat active; walks a mile with wife everyday and does yardwork.  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    Limitations  Walking;House hold activities    Patient Stated Goals  Decrease falls, improve balance, safety with ADLs    Currently in Pain?  No/denies      Therapeutic Exercise:   Long sittingcalf stretch - 1x30 sec hold Long sitting RTB dorsiflexion, eversion - 2x10 each Long sitting RTB plantarflexion, inversion - 1x10 each    Neuro:   Tandem walking on stable surface in // bars forward/ backward - x5 with cueing for heel strike Tandem walking on foam in // bars - x4 Wobble board: DF/ PF, lateral - 2x1 min Tandem standing on foam without UE support - x1 min B Reaching for cones at various heights on foam - 1x15 B Forward reaching for cones with modified tandem standing on foam - 1x15 B Forward reaching for cones with tandem standing on foam - 1x15 B Stepping strategies: posterior, anterior - 1x10 B each   PT Education - 07/19/19 1058    Education  Details  Pt educated on stepping strategies    Person(s) Educated  Patient    Methods  Explanation;Demonstration    Comprehension  Verbalized understanding;Returned demonstration       PT Short Term Goals - 07/13/19 1331      PT SHORT TERM GOAL #1   Title  Pt will present with no significant postural limitations to demonstrate postural control.    Baseline  Pt presents with rounded shoulders/ forward head posture 06/28/2019.  Pt. able to corrrect with cuing    Time  2    Period  Weeks    Status  Achieved    Target Date  07/12/19        PT Long Term Goals - 06/30/19 1929      PT LONG TERM GOAL #1   Title  Pt will demonstrate FOTO score of 81 to indicate improved functional mobility.    Baseline  Pt FOTO score 70 06/28/2019    Time  4    Period  Weeks    Status  New    Target Date  07/26/19      PT LONG TERM GOAL #2   Title  Pt R LE MMT assessment will reveal 5/5 grossly to indicate increased strength for functional activity (ie. yardwork).    Baseline  Pt R LE hip/ knee flexion 4+/5, ankle dorsiflexion 3/5 06/28/2019    Time  4    Period  Weeks    Status  New    Target Date  07/26/19      PT LONG TERM GOAL #3   Title  Pt will demonstrate heel raise test with 25 full ROM reps B to demonstrate increased LE endurance.    Baseline  Heel raise test 20 reps with decreased ROM 06/28/2019    Time  4    Period  Weeks    Status  New    Target Date  07/26/19      PT LONG TERM GOAL #4   Title  Pt will report no falls in 3 weeks to demonstrate increased balance and safety at home.    Baseline  Pt reports multiple falls in the last couple of weeks 06/28/2019    Time  4    Period  Weeks    Status  New    Target Date  07/26/19            Plan - 07/19/19 1100    Clinical Impression Statement  Pt demonstrates consistent heel strike with gait in clinic and reports feeling 50% improved from starting therapy.  Pt has sufficient ankle  control and stepping strategies with balance and  balance disturbance exercises.  Pt will beneftit from further skilled therapy to become independent with HEP for self management.    Personal Factors and Comorbidities  Age;Comorbidity 1    Comorbidities  Bone marrow neofibrosis    Examination-Participation Restrictions  Yard Work    Stability/Clinical Decision Making  Stable/Uncomplicated    Clinical Decision Making  Low    Rehab Potential  Good    PT Frequency  2x / week    PT Duration  4 weeks    PT Treatment/Interventions  ADLs/Self Care Home Management;Electrical Stimulation;Moist Heat;Gait training;Stair training;Functional mobility training;Neuromuscular re-education;Balance training;Therapeutic exercise;Therapeutic activities;Patient/family education;Manual techniques;Passive range of motion    PT Next Visit Plan  Review/ progress comprehensive HEP, DISCHARGE    PT Home Exercise Plan  [id number]    Consulted and Agree with Plan of Care  Patient       Patient will benefit from skilled therapeutic intervention in order to improve the following deficits and impairments:  Abnormal gait, Decreased balance, Decreased endurance, Decreased mobility, Difficulty walking, Hypomobility, Decreased range of motion, Improper body mechanics, Decreased activity tolerance, Decreased coordination, Decreased strength, Impaired flexibility, Postural dysfunction  Visit Diagnosis: Foot drop, right  Weakness of right lower extremity  Decreased range of motion of right lower extremity  Abnormality of gait     Problem List Patient Active Problem List   Diagnosis Date Noted  . Foot drop, right 06/09/2019  . Crohn's disease of small intestine without complication ([place]) 20/80/2233  . Right-sided thoracic back pain 04/06/2019  . Hyponatremia 04/06/2019  . Iron deficiency anemia 11/26/2018  . Goals of care, counseling/discussion 01/06/2018  . 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 High triglycerides 10/14/2017  . Bence Jones protein  present in urine 10/15/2016  . Renal insufficiency 07/23/2016  . 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 Right ankle pain 10/31/2015  . Hyperuricemia 09/05/2015  . BPH (benign prostatic hyperplasia) 08/23/2015  . History of tobacco use 08/23/2015  . Glaucoma 08/23/2015  . Enlarged prostate without lower urinary tract symptoms (luts) 08/23/2015  . Elevated prostate specific antigen (PSA) 08/23/2015  . Dermatitis 08/23/2015  . Crohn disease ([place]) 08/23/2015  . Other prurigo 08/23/2015  . Dorsalgia 08/23/2015  . Personal history of nicotine dependence 08/23/2015  . Polycythemia vera ([place]) 04/04/2015  . Secondary polycythemia 03/28/2015  . 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 Right bundle-branch block 05/03/2012   Pura Spice, PT, DPT # [id number] Chinita [place], SPT 07/19/2019, 11:18 AM  [facility] [address] [place], [place], [address] Phone: [phone]   Fax:  [phone]  Name: Ruben [name] MRN: [id number] Date of Birth: 1946-05-01

## 2019-07-21 ENCOUNTER — Ambulatory Visit: Payer: Medicare Other

## 2019-07-21 ENCOUNTER — Encounter: Payer: Self-pay | Admitting: Physical Therapy

## 2019-07-21 ENCOUNTER — Other Ambulatory Visit: Payer: Self-pay

## 2019-07-21 DIAGNOSIS — R269 Unspecified abnormalities of gait and mobility: Secondary | ICD-10-CM

## 2019-07-21 DIAGNOSIS — M21371 Foot drop, right foot: Secondary | ICD-10-CM

## 2019-07-21 DIAGNOSIS — M25661 Stiffness of right knee, not elsewhere classified: Secondary | ICD-10-CM

## 2019-07-21 DIAGNOSIS — R29898 Other symptoms and signs involving the musculoskeletal system: Secondary | ICD-10-CM | POA: Diagnosis not present

## 2019-07-21 MED FILL — JAKAFI 20 MG TABLET: 20 | 30 days supply | Qty: 60 | Fill #0

## 2019-07-21 NOTE — Therapy (Signed)
[facility] [address] [place], [place], [address] Phone: [phone]   Fax:  [phone]  Physical Therapy Treatment and Discharge  Patient Details  Name: Ruben [name] MRN: [id number] Date of Birth: 16-Dec-1945 Referring Provider (PT): Hemang K. Manuella Ghazi, MD   Encounter Date: 07/21/2019  PT End of Session - 07/21/19 1047    Visit Number  6    Number of Visits  8    Date for PT Re-Evaluation  07/26/19    Authorization - Visit Number  6    Authorization - Number of Visits  10    PT Start Time  2549    PT Stop Time  1130    PT Time Calculation (min)  46 min    Equipment Utilized During Treatment  Gait belt    Activity Tolerance  Patient tolerated treatment well    Behavior During Therapy  WFL for tasks assessed/performed       Past Medical History:  Diagnosis Date  . Blood dyscrasia   . BPH (benign prostatic hyperplasia)   . Cancer ([place])    SKIN/ POLYCYTHEMIA VERA  . Chronic kidney disease    RENAL INSUFF (40%)  . Crohn disease ([place])   . Crohn's disease ([place])   . Glaucoma   . Glaucoma   . Gout   . History of chicken pox   . Myelofibrosis ([place])   . Nosebleed   . RBBB   . Right bundle branch block     Past Surgical History:  Procedure Laterality Date  . CATARACT EXTRACTION W/PHACO Left 07/13/2018   Procedure: CATARACT EXTRACTION PHACO AND INTRAOCULAR LENS PLACEMENT (IOC);  Surgeon: [name], MD;  Location: [facility] ORS;  Service: Ophthalmology;  Laterality: Left;  [place] 00:39  CDE 5.14 Fluid pack lot # [id number]  . CATARACT EXTRACTION W/PHACO Right 08/03/2018   Procedure: CATARACT EXTRACTION PHACO AND INTRAOCULAR LENS PLACEMENT (IOC);  Surgeon: [name], MD;  Location: [facility] ORS;  Service: Ophthalmology;  Laterality: Right;  [place] 00:43.4 CDE 5.23 Fluid pack Lot # [id number]  . COLONOSCOPY WITH PROPOFOL N/A 03/27/2017   Procedure: COLONOSCOPY WITH PROPOFOL;  Surgeon: [name], MD;  Location: [facility]  ENDOSCOPY;  Service: Endoscopy;  Laterality: N/A;  . MOHS SURGERY     EAR  . PROSTATE SURGERY  2008   Prostate Biopsy in [place] to elevated PSA.  per patient normal  . Skin Lesion Basal cell removed    . wart removal     from eyelid    There were no vitals filed for this visit.  Subjective Assessment - 07/21/19 1044    Subjective  Pt reports hamstrings are "sore" today (3/10) and that he did his HEP and walked for 45 minutes yesterday.  Pt states he did lots of bending activities with his yardwork on Tuesday.    Pertinent History  Pt presents to therapy with dx of right foot drop and R sided weakness.  Onset was in September and has been improving.  Pt reports frequent falls initially that has decreased over time with few in the last couple of weeks (no significant injuries from previous falls).  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  Pt somewhat active; walks a mile with wife everyday and does yardwork.  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    Limitations  Walking;House hold activities    Patient Stated Goals  Decrease falls, improve  balance, safety with ADLs    Currently in Pain?  Yes    Pain Score  3     Pain Location  Leg    Pain Orientation  Left;Right    Pain Descriptors / Indicators  Sore    Pain Onset  Today        Goal Assessment: FOTO: 86  MMT  R/L Hip flexion 4+/5 Ankle DF 4-/5  Heel Raise Test: 25 full ROM reps B LE  Pt reports no falls in the past 3 weeks.    TREATMENT: Therapeutic Exercise: Nustep L5 x10 above 80 rpm Seated self RTB inversion and plantarflexion Standing marching - 1x10 with B UE support, 1x10 with unilateral support  Pt educated on comprehensive HEP: discussed, demonstrated, consented.    Clinical Impression: Pt is ready and willing to discharge; pt reports feeling 90% improved and feels able to walk and move safely. Pt grossly met all goals and given HEP to  address remaining deficits in LE strength. Pt agrees to discharge and educated on comprehensive HEP for self-management and progression.    PT Education - 07/21/19 1046    Education Details  Pt educated on comprehensive HEP    Person(s) Educated  Patient    Methods  Explanation;Demonstration    Comprehension  Verbalized understanding;Returned demonstration       PT Short Term Goals - 07/13/19 1331      PT SHORT TERM GOAL #1   Title  Pt will present with no significant postural limitations to demonstrate postural control.    Baseline  Pt presents with rounded shoulders/ forward head posture 06/28/2019.  Pt. able to corrrect with cuing    Time  2    Period  Weeks    Status  Achieved    Target Date  07/12/19        PT Long Term Goals - 07/21/19 1706      PT LONG TERM GOAL #1   Title  Pt will demonstrate FOTO score of 81 to indicate improved functional mobility.    Baseline  Pt FOTO score 70 06/28/2019; pt FOTO score 86 07/21/2019    Time  4    Period  Weeks    Status  Achieved    Target Date  07/21/19      PT LONG TERM GOAL #2   Title  Pt R LE MMT assessment will reveal 5/5 grossly to indicate increased strength for functional activity (ie. yardwork).    Baseline  Pt R LE hip/ knee flexion 4+/5, ankle dorsiflexion 3/5 06/28/2019; Pt R LE hip flexion 4+/5, ankle DF 4-/5 07/21/2019    Time  4    Period  Weeks    Status  Partially Met    Target Date  07/21/19      PT LONG TERM GOAL #3   Title  Pt will demonstrate heel raise test with 25 full ROM reps B to demonstrate increased LE endurance.    Baseline  Heel raise test 20 reps with decreased ROM 06/28/2019; Heel raise test 25 full ROM reps B LE 07/21/2019    Time  4    Period  Weeks    Status  Achieved    Target Date  07/21/19      PT LONG TERM GOAL #4   Title  Pt will report no falls in 3 weeks to demonstrate increased balance and safety at home.    Baseline  Pt reports multiple falls in the last couple of weeks  06/28/2019;  pt reports no falls in the past 3 weeks    Time  4    Period  Weeks    Status  Achieved    Target Date  07/21/19            Plan - 07/21/19 1048    Clinical Impression Statement  Pt is ready and willing to discharge; pt reports feeling 90% improved and feels able to walk and move safely.  Pt grossly met all goals and given HEP to address remaining deficits in LE strength.  Pt agrees to discharge and educated on comprehensive HEP for self-management and progression.    Personal Factors and Comorbidities  Age;Comorbidity 1    Comorbidities  Bone marrow neofibrosis    Examination-Participation Restrictions  Yard Work    Stability/Clinical Decision Making  Stable/Uncomplicated    Clinical Decision Making  Low    Rehab Potential  Good    PT Frequency  --    PT Duration  --   discharge   PT Treatment/Interventions  ADLs/Self Care Home Management;Electrical Stimulation;Moist Heat;Gait training;Stair training;Functional mobility training;Neuromuscular re-education;Balance training;Therapeutic exercise;Therapeutic activities;Patient/family education;Manual techniques;Passive range of motion    PT Next Visit Plan  Pt discharged with comprehensive HEP for self-management at home    PT Howard  [id number]    Consulted and Agree with Plan of Care  Patient       Patient will benefit from skilled therapeutic intervention in order to improve the following deficits and impairments:  Abnormal gait, Decreased balance, Decreased endurance, Decreased mobility, Difficulty walking, Hypomobility, Decreased range of motion, Improper body mechanics, Decreased activity tolerance, Decreased coordination, Decreased strength, Impaired flexibility, Postural dysfunction  Visit Diagnosis: Weakness of right lower extremity  Foot drop, right  Decreased range of motion of right lower extremity  Abnormality of gait     Problem List Patient Active Problem List   Diagnosis Date Noted  .  Foot drop, right 06/09/2019  . Crohn's disease of small intestine without complication ([place]) 07/61/5183  . Right-sided thoracic back pain 04/06/2019  . Hyponatremia 04/06/2019  . Iron deficiency anemia 11/26/2018  . Goals of care, counseling/discussion 01/06/2018  . 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 High triglycerides 10/14/2017  . 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 Renal insufficiency 07/23/2016  . 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 Right ankle pain 10/31/2015  . Hyperuricemia 09/05/2015  . BPH (benign prostatic hyperplasia) 08/23/2015  . History of tobacco use 08/23/2015  . Glaucoma 08/23/2015  . Enlarged prostate without lower urinary tract symptoms ([place]) 08/23/2015  . Elevated prostate specific antigen (PSA) 08/23/2015  . Dermatitis 08/23/2015  . Crohn disease ([place]) 08/23/2015  . Other prurigo 08/23/2015  . Dorsalgia 08/23/2015  . Personal history of nicotine dependence 08/23/2015  . Polycythemia vera ([place]) 04/04/2015  . Secondary polycythemia 03/28/2015  . 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 Right bundle-branch block 05/03/2012    Chinita Greenland, SPT 07/21/2019, 5:16 PM  [facility] [address] [place], [place], [address] Phone: [phone]   Fax:  [phone]  Name: Ruben [name] MRN: [id number] Date of Birth: 1946/08/10

## 2019-07-21 NOTE — Patient Instructions (Signed)
Access Code: [email or web address]  URL: [email or web address]  Date: 07/21/2019  Prepared by: Lieutenant Diego   Exercises  Single Leg Heel Raise with Counter Support - 20 reps - 2 sets - 1x daily - 4x weekly  Standing Gastroc Stretch on Step with Counter Support - 20 reps - 2 sets - 5 second hold - 1x daily - 4x weekly  Ankle Alphabet in Elevation - 1 reps - 2 sets - 1x daily - 4x weekly  Seated Ankle Dorsiflexion with Resistance - 20 reps - 2 sets - 1x daily - 4x weekly  Seated Ankle Eversion with Resistance - 20 reps - 2 sets - 1x daily - 4x weekly  Seated Ankle Inversion with Resistance and Legs Crossed - 20 reps - 2 sets - 1x daily - 4x weekly  Seated Ankle Plantar Flexion with Resistance Loop - 20 reps - 2 sets - 1x daily - 4x weekly  Sit to Stand without Arm Support - 10 reps - 2 sets - 1x daily - 4x weekly  Standing March with Unilateral Counter Support - 10 reps - 3 sets - 1x daily - 2x weekly

## 2019-07-25 ENCOUNTER — Telehealth: Admit: 2019-07-25 | Discharge: 2019-07-26 | Payer: MEDICARE | Attending: Adult Health | Primary: Adult Health

## 2019-07-25 DIAGNOSIS — D45 Polycythemia vera: Secondary | ICD-10-CM | POA: Diagnosis not present

## 2019-07-25 DIAGNOSIS — D471 Chronic myeloproliferative disease: Secondary | ICD-10-CM | POA: Diagnosis not present

## 2019-07-26 ENCOUNTER — Encounter: Payer: Self-pay | Admitting: Hematology and Oncology

## 2019-07-26 ENCOUNTER — Ambulatory Visit: Payer: Medicare Other | Admitting: Physical Therapy

## 2019-07-26 NOTE — Progress Notes (Signed)
[facility]  [address] [place], [place] [address] Phone: [phone]  Fax: [phone]   Clinic Day:  07/28/2019  Referring physician: [name], MD  Chief Complaint: Ruben [name] is a 73 y.o. 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  HPI: The patient was last seen in the hematology clinic on 07/05/2019. At that time, he felt better today. He noted resolution of his low grade fever. Fatigue had improved. Exam revealed no adenopathy or hepatosplenomegaly. Hematocrit 33.7. Hemoglobin 10.4. MCV 87.8. WBC 40,800.  Platelets 185,000.  Uric acid was 10 (elevated). He continued Jakafi.  Dr Evelene Croon was contacted regarding need for bone marrow.  Patient continued physical therapy with Dorcas Carrow, PT regarding right foot drop and right sided weakness.   Patient had a Crohn's follow up with Dr. Gustavo Lah on 07/06/2019. His bowels were improving. His appetite and energy did not improve. His weight was stable. He had no fevers, chills, dysphagia, heartburn, or acid reflux. Patient was advised to avoid NSAIDs. There were no further recommendation at that time.   Patient was seen by Dr. Evelene Croon on 07/11/2019.  Fatigue was worsening. Bone marrow biopsy at [facility] was ordered to check disease status. If myelofibrosis, plan was for Inrebic (fedrantinib). If MDS, accelerated phase, Dr. Evelene Croon suggested low-dose chemotherapy. Jakafi decreased to 20 mg BID.  Patient was to follow up virtually in 2 weeks.   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  Myeloid mutation panel revealed the patient's previously reported variants in IDH2, JAK2, KRAS, NRAS and SRSF2 were again identified.  There are no new variants.  Cytogenetics are pending.  Patient spoke with Precious Reel, AG NP at [facility] on  07/25/2019 via telemedicine.  Bone marrow had confirmed that he was failing Jakafi.  Options included fedratinib off study or fedratinib on the FREEDOM001 study. If enrolled on clinical trial, he would require monthly follow-up at [facility].  Advantage of enrolling on the trial was the use of luspatercept for anemia.  He would require an MRI of the abdomen to evaluate spleen size prior to the study (endpoint of fedratinib is to decrease spleen size).  He has a follow-up in 1 month.  During the interim, he has been doing well. Patient reports that he bruises easily. His foot drop is improving. He is able to ambulate better. His bowels are normal. Patient and wife are both concerned about going to [facility], because they did not like the atmosphere. Patient's wife expresses her appreciation for the care that I have provided locally. Patient would like to be seen in the clinic as needed.    Past Medical History:  Diagnosis Date   Blood dyscrasia    BPH (benign prostatic hyperplasia)    Cancer (HCC)    SKIN/ POLYCYTHEMIA VERA   Chronic kidney disease    RENAL INSUFF (40%)   Crohn disease (HCC)    Crohn's disease ([place])    Glaucoma    Glaucoma    Gout    History of chicken pox    Myelofibrosis ([place])    Nosebleed    RBBB    Right bundle branch block     Past Surgical History:  Procedure Laterality Date   CATARACT EXTRACTION W/PHACO Left 07/13/2018   Procedure: CATARACT EXTRACTION PHACO AND INTRAOCULAR LENS PLACEMENT ([place]);  Surgeon:  [name], MD;  Location: [facility] ORS;  Service: Ophthalmology;  Laterality: Left;  [place] 00:39  CDE 5.14 Fluid pack lot # [id number]   CATARACT EXTRACTION W/PHACO Right 08/03/2018   Procedure: CATARACT EXTRACTION PHACO AND INTRAOCULAR LENS PLACEMENT ([facility]);  Surgeon: [name], MD;  Location: [facility] ORS;  Service: Ophthalmology;  Laterality: Right;  [place] 00:43.4 CDE 5.23 Fluid pack Lot # [id number]   COLONOSCOPY WITH PROPOFOL N/A 03/27/2017   Procedure:  COLONOSCOPY WITH PROPOFOL;  Surgeon: [name], MD;  Location: [facility] ENDOSCOPY;  Service: Endoscopy;  Laterality: N/A;   [place] SURGERY  2008   Prostate Biopsy in [place] to elevated PSA.  per patient normal   Skin Lesion Basal cell removed     wart removal     from eyelid    Family History  Problem Relation Age of Onset   Cancer Mother        Liver   Cancer Father        Prostate   Cancer Sister        Hodgkins lymphoma   Cancer Paternal Aunt        Breast    Social History:  reports that he quit smoking about 15 years ago. His smoking use included cigarettes. He has a 5.00 pack-year smoking history. He has never used smokeless tobacco. He reports current alcohol use of about 4.0 - 5.0 standard drinks of alcohol per week. He reports that he does not use drugs. He previously worked for [employer]. He no longer volunteers for [employer]. The patient's wife isJudith [phone]).He lives in [place]. The patient is accompanied by his wife, [name] [name], today.  Allergies: No Known Allergies  Current Medications: Current Outpatient Medications  Medication Sig Dispense Refill   acetaminophen (TYLENOL) 500 MG tablet Take 500 mg by mouth daily as needed for moderate pain.     allopurinol (ZYLOPRIM) 300 MG tablet Take 0.5 tablets (150 mg total) by mouth daily. 30 tablet 1   Cholecalciferol (VITAMIN D3) 1000 units CAPS Take 1,000 Units by mouth daily at 12 noon.      Cyanocobalamin (RA VITAMIN B-12 TR) 1000 MCG TBCR Take 1,000 mcg by mouth daily at 12 noon.      ferrous sulfate 325 (65 FE) MG tablet Take 325 mg by mouth every Monday, Wednesday, and Friday.      finasteride (PROSCAR) 5 MG tablet Take 1 tablet (5 mg total) by mouth every evening. 90 tablet 3   folic acid (FOLVITE) 1 MG tablet Take 1 mg by mouth daily at 12 noon.      latanoprost (XALATAN) 0.005 % ophthalmic solution Place 1 drop into both eyes at bedtime.       Multiple Vitamins-Minerals (CENTRUM SILVER ULTRA MENS) TABS Take 1 tablet by mouth daily at 12 noon.      Omega-3 Fatty Acids (KP FISH OIL) 1200 MG CAPS Take 1,200 mg by mouth daily.      ruxolitinib phosphate (JAKAFI) 20 MG tablet Take 1 tablet (20 mg total) by mouth 2 (two) times daily. 60 tablet 0   sodium bicarbonate 650 MG tablet Take 650 mg by mouth 2 (two) times daily.     timolol (TIMOPTIC) 0.5 % ophthalmic solution Place 1 drop into both eyes daily. In AM  5   No current facility-administered medications for this visit.     Review of Systems  Constitutional: Negative for chills, diaphoresis, fever, malaise/fatigue and weight loss (  up 4 pounds).       Doing well.  HENT: Negative.  Negative for congestion, ear pain, hearing loss, nosebleeds, sinus pain and sore throat.   Eyes: Negative for blurred vision, double vision and photophobia.       S/p cataract surgery.  Uses reading glasses.  Respiratory: Negative.  Negative for cough, hemoptysis, sputum production and shortness of breath.   Cardiovascular: Negative.  Negative for chest pain, palpitations, orthopnea, leg swelling and PND.  Gastrointestinal: Negative for abdominal pain, blood in stool, constipation, diarrhea, heartburn, melena, nausea and vomiting.       Crohn's. Normal bowel movements.  Genitourinary: Negative.  Negative for dysuria, frequency, hematuria and urgency.  Musculoskeletal: Negative.  Negative for back pain, falls, joint pain, myalgias and neck pain.  Skin: Negative.  Negative for itching and rash.  Neurological: Positive for focal weakness (right foot drop, improving). Negative for dizziness, tingling, tremors, sensory change, speech change, weakness and headaches.  Endo/Heme/Allergies: Negative for environmental allergies. Bruises/bleeds easily.  Psychiatric/Behavioral: Positive for memory loss (mild). Negative for depression and substance abuse. The patient is not nervous/anxious and does not have insomnia.    All other systems reviewed and are negative.  Performance status (ECOG):  1  Vitals Blood pressure 125/68, pulse 70, temperature (!) 97.3 F (36.3 C), temperature source Tympanic, resp. rate 14, weight 169 lb 10.3 oz (76.9 kg), SpO2 100 %.  Physical Exam  Constitutional: He is oriented to person, place, and time. He appears well-developed and well-nourished. No distress.  HENT:  Head: Normocephalic and atraumatic.  Mouth/Throat: Oropharynx is clear and moist. No oropharyngeal exudate.  Thin gray hair.  Mask.  Eyes: Pupils are equal, round, and reactive to light. Conjunctivae and EOM are normal. No scleral icterus.  Glasses.  Blue eyes.  Neck: Normal range of motion. Neck supple. No JVD present.  Cardiovascular: Normal rate, regular rhythm and normal heart sounds. Exam reveals no gallop.  No murmur heard. Pulmonary/Chest: Effort normal and breath sounds normal. No respiratory distress. He has no wheezes. He has no rales.  Abdominal: Soft. Bowel sounds are normal. He exhibits no distension and no mass. There is no hepatosplenomegaly. There is no abdominal tenderness. There is no rebound and no guarding.  Musculoskeletal: Normal range of motion.        General: No edema.  Lymphadenopathy:       Head (right side): No preauricular, no posterior auricular and no occipital adenopathy present.       Head (left side): No preauricular, no posterior auricular and no occipital adenopathy present.    He has no cervical adenopathy.    He has no axillary adenopathy.       Right: No inguinal and no supraclavicular adenopathy present.       Left: No inguinal and no supraclavicular adenopathy present.  Neurological: He is alert and oriented to person, place, and time.  Right foot drop, slight. Able to dorsiflex foot against some resistance.  Skin: Skin is warm and dry. No rash noted. He is not diaphoretic. No erythema. No pallor.  Psychiatric: He has a normal mood and affect. His behavior is  normal. Judgment and thought content normal.  Nursing note and vitals reviewed.   No visits with results within 3 Day(s) from this visit.  Latest known visit with results is:  Orders Only on 07/05/2019  Component Date Value Ref Range Status   Path Review 07/05/2019 Blood smear is reviewed.   Final   Comment: Patient with known polycythemia  vera on Jakafi. Per [facility]Everywhere prior bone marrow biopsies document MPN with dysplasia. Leukocytosis, progressed since last month. Left shift with myelocytes and rare blast present. Blasts less than 5 %.   Neutrophils with dysplastic features including pseudo Pelger Huet change and hypogranulation. Stable normocytic anemia with rare teardrop cell. Stable platelet count with variation in size and focal hypogranularity. Findings are consistent with the patient's known myeloproliferative neoplasm.  Reviewed by Dellia Nims Reuel Derby, M.D. Performed at [facility], [address] [place], [place] [address]     Assessment:  CALDER OBLINGER is a 73 y.o. 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 He has had polycythemia dating back to 2013. Hematocrit was 62.1 with a hemoglobin of 19.8 on 03/28/2015. 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 Erythropoietin level was 1.1 (low). 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  He began a phlebotomyprogram on 03/28/2015 to maintain a hematocrit goal of <45.Last phlebotomywason 05/02/2015. He is on a baby aspirin.  CBC on 11/28/2015 revealed a hematocrit of 39.3, hemoglobin 11.9, platelets 463,000, WBC 32,000 with an ANC of 25,000. Differential included 71% segs, 4% lymphocytes and 17% monocytes. Peripheral smear revealed leukocytosis with predominantly mature neutrophils, increased monocytes and rare blasts (<1%).  Bone marrowon 12/14/2015 revealed a persistent myeloproliferative neoplasm with myelofibrosis and alterations compatible with myelodysplatic progression. Marrow was packed  (95-100% cellularity) with pan myelosis, multi-lineage dyspoiesis, and no significant increase in blasts. There was moderate to focally marked reticulin fibrosis (grade 2-3/3). Storage iron was not identified. Flow cytometry revealed non-specific atypical myeloid findings with no increase in blasts. 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 Cytogenetics were normal (46, XY).  Bone marrowon 04/07/2016 at [facility] revealed a hypercellular marrow (>95%) with persistent involvement by myeloid neoplasm with 5% blasts. There was mild reticulin fibrosis. FISH t(9;22) results were normal. 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 Cytogenetics are pending.  Bone marrowon 08/09/2018 revealed a hypercellular bone marrow (>95%) with persistent involvement by myeloid neoplasm with <1% blasts by manual touch preparation differential. There was marked reticulin fibrosis with focal collagen deposition. Cytogenetics were normal (46, XY). - Myeloid mutation panel study is pending.  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  Myeloid mutation panel revealed the patient's previously reported variants in IDH2, JAK2, KRAS, NRAS and SRSF2 were again identified.  There are no new variants.  Cytogenetics are pending.  He was briefly on hydroxyurea500 mg a day (12/05/2015 - 12/19/2015 and 06/25/2016 - 07/03/2016). Platelet count increased to 1.18 million and white count to 54,800 without increased blasts on 06/25/2016. CBC on 07/02/2016 revealed a platelets of 429,000 with a white count 30,600. He began allopurinolon 12/19/2015. He began Jakafi 20 mg BID on 05/28/2016. He has been onJakafi25 mg BID since09/12/2016.  LDHis followed: 466  on 01/02/2016, 596 on 05/23/2016, 663 on 07/30/2016, 522 on 09/24/2016, 638 on 01/05/2017, 625 on 02/25/2017, 1144 on 06/10/2017, 720 on 06/17/2017, 728 on 06/24/2017, and 724 on 01/06/2018. Triglycerideswere 183 (<150) on 08/20/2016, 180 on 01/05/2017, 312 on 06/17/2017, 165 on 11/25/2017, 113 on 02/17/2018.  He has urinary retention. Renal ultrasoundon 07/28/2016 revealed bilateral hydronephrosis (right >left) and a large postvoid residual (2 liters) suggesting bladder dysfunction or outlet obstruction. He underwent temporary Foley catheter placement. He performs I/O self catheterizations. He is on Flomax and  finasteride. PSAwas 2.02 on 07/09/2016.  He is followed by GI (Dr. Vira Agar) for a history of polypsand Crohn's disease.He had an unremarkable colonoscopy in 03/2017.AbdomenandpelvisCTon 04/03/2019 revealed mild wall thickening of portions of a loop of small bowel within the pelvis with intervening areas of mildly dilated small bowel,consistent with active Crohn's disease. No evidence of an abscess or fistula. No other evidence of bowel inflammation.There was splenomegaly(16.7 x 7.7 x 15.6 cm), developed since12/11/2011.There was mild bladder wall thickeningandenlargement of the prostate. Chronic bladder outlet obstructionwas felt to bethe etiology of bladder wall thickening.  He has chronicrenal insufficiency(Cr 1.85; CrCl 36 ml/min). SPEP on 09/23/2016 was negative. Spot urine revealed 17.4% of 27.3 mg/dL.of a monoclonal protein. 24 hour urine on 10/16/2016 revealed no monoclonal protein. Renal function transiently decreased in 06/2017 after Bactrim. He is followed by nephrology and urology.  He has a history of epistaxis. Normal studies included: PT, PTT, von Willebrand panel, and platelet function assay (PFA).  He has right foot drop.  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 There was no non-contrast MRI  evidence of brain metastasis. There was only mild for age nonspecific cerebral white signal changes.  Lumbar spine MRI on 06/09/2019 showed diffusely abnormal marrow signal in the visible skeleton, as seen on the brain MRI. There was no pathologic fracture and no extraosseous extension of tumor on the lumbar spine or visible sacrum. In conjunction with the splenomegaly by CT in July, marrow infiltration by leukemia/lymphoma may be the most likely etiology. Differential considerations included multiple myeloma and metastatic disease unknown primary. The involvement of the pelvis should be amenable to bone marrow biopsy. There was superimposed mild for age lumbar spine degeneration with no spinal stenosis. There was mild degenerative L4 and L5 neural foraminal stenosis.   Symptomatically, he feels good.  Foot drop has improved.  Exam reveals no adenopathy or hepatosplenomegaly.  Plan: 1.   Review labs from [facility]. 2. Myeloproliferative neoplasm / Polycythemia rubra vera Clinically, he is doing well.  Hematocrit 33.7. Hemoglobin 10.4. Platelets 185,000. WBC 40,800 (ANC 22,700) on 07/05/2019.   Hematocrit 35.0.  Hemoglobin 10.8.  Platelets 253,000.  WBC 58,600 ([place] 50,400) on 07/14/2019.  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     Myeloid mutation panel revealed the patient's previously reported variants.     Cytogenetics are pending.  Decrease Jakafi to 20 mg BID beginning tomorrow. Patient back on allopurinol   Discuss plan for fedratinib off study or fedratinib on the FREEDOM001 study.    Encourage protocol enrollment secondary to access to luspatercept for anemia.     MRI of the abdomen needed to evaluate spleen size prior to the study (endpoint of fedratinib is to decrease spleen size).    Phone follow-up with Dr Adriana Simas. 3. Iron  deficiency Hematocrit 35.0. Hemoglobin 10.8. MCV88.6. Ferritin was 88 on 06/02/2019 (prior value 163 during Crohn's flare) Continue oral iron 3 days/week (Mondays, Wednesdays and Fridays). Continue to monitor. 4.Crohn's disease Disease is currently quiescent.  He is off steroids.  He is followed by the GI [facility]. Continue to monitor. 5.Right foot drop Etiology remains unclear.             Foot drop has clearly improved.  He is followed by Dr. Manuella Ghazi, neurologist. 6.Patient to enroll on the [place] study at [facility]. 7.   Patient to call for labs and appts  if needed.  I discussed the assessment and treatment plan with the patient.  The patient was provided an opportunity to ask questions and all were answered.  The patient agreed with the plan and demonstrated an understanding of the instructions.  The patient was advised to call back if the symptoms worsen or if the condition fails to improve as anticipated.  I provided 31 minutes of face-to-face time during this this encounter and > 50% was spent counseling as documented under my assessment and plan.    Lequita Asal, MD, PhD    07/28/2019, 9:42 AM  I, Selena Batten, am acting as scribe for [employer]. Mike Gip, MD, PhD.  I, Ailana Cuadrado C. Mike Gip, MD, have reviewed the above documentation for accuracy and completeness, and I agree with the above.

## 2019-07-28 ENCOUNTER — Other Ambulatory Visit: Payer: Self-pay

## 2019-07-28 ENCOUNTER — Encounter: Payer: Self-pay | Admitting: Hematology and Oncology

## 2019-07-28 ENCOUNTER — Inpatient Hospital Stay: Payer: Medicare Other | Attending: Hematology and Oncology | Admitting: Hematology and Oncology

## 2019-07-28 ENCOUNTER — Encounter: Payer: Medicare Other | Admitting: Physical Therapy

## 2019-07-28 VITALS — BP 125/68 | HR 70 | Temp 97.3°F | Resp 14 | Wt 169.6 lb

## 2019-07-28 DIAGNOSIS — R5383 Other fatigue: Secondary | ICD-10-CM | POA: Diagnosis not present

## 2019-07-28 DIAGNOSIS — R338 Other retention of urine: Secondary | ICD-10-CM | POA: Insufficient documentation

## 2019-07-28 DIAGNOSIS — Z7189 Other specified counseling: Secondary | ICD-10-CM | POA: Diagnosis not present

## 2019-07-28 DIAGNOSIS — D471 Chronic myeloproliferative disease: Secondary | ICD-10-CM | POA: Diagnosis not present

## 2019-07-28 DIAGNOSIS — M21371 Foot drop, right foot: Secondary | ICD-10-CM | POA: Diagnosis not present

## 2019-07-28 DIAGNOSIS — Z79899 Other long term (current) drug therapy: Secondary | ICD-10-CM | POA: Diagnosis not present

## 2019-07-28 DIAGNOSIS — Z87891 Personal history of nicotine dependence: Secondary | ICD-10-CM | POA: Diagnosis not present

## 2019-07-28 DIAGNOSIS — K5090 Crohn's disease, unspecified, without complications: Secondary | ICD-10-CM | POA: Insufficient documentation

## 2019-07-28 DIAGNOSIS — N189 Chronic kidney disease, unspecified: Secondary | ICD-10-CM | POA: Diagnosis not present

## 2019-07-28 DIAGNOSIS — D649 Anemia, unspecified: Secondary | ICD-10-CM | POA: Insufficient documentation

## 2019-07-28 DIAGNOSIS — D45 Polycythemia vera: Secondary | ICD-10-CM | POA: Diagnosis not present

## 2019-07-28 DIAGNOSIS — N401 Enlarged prostate with lower urinary tract symptoms: Secondary | ICD-10-CM | POA: Diagnosis not present

## 2019-07-28 DIAGNOSIS — D500 Iron deficiency anemia secondary to blood loss (chronic): Secondary | ICD-10-CM | POA: Diagnosis not present

## 2019-07-28 NOTE — Progress Notes (Signed)
Patient here for follow up. Denies any concerns.  

## 2019-08-17 ENCOUNTER — Ambulatory Visit: Admit: 2019-08-17 | Discharge: 2019-08-18 | Payer: MEDICARE

## 2019-08-17 DIAGNOSIS — D508 Other iron deficiency anemias: Secondary | ICD-10-CM | POA: Diagnosis not present

## 2019-08-17 DIAGNOSIS — D45 Polycythemia vera: Secondary | ICD-10-CM | POA: Diagnosis not present

## 2019-08-19 ENCOUNTER — Other Ambulatory Visit: Payer: Self-pay | Admitting: Hematology and Oncology

## 2019-08-19 DIAGNOSIS — D45 Polycythemia vera: Secondary | ICD-10-CM

## 2019-08-22 ENCOUNTER — Telehealth: Payer: Self-pay

## 2019-08-22 NOTE — Telephone Encounter (Signed)
-----   Message from Lequita Asal, MD sent at 08/21/2019  7:17 AM EST ----- Regarding: Please call patient  Does he need a refill of his Jakafi?  If so, what dose (he was being tapered by Dr Adriana Simas)?  M

## 2019-08-22 NOTE — Telephone Encounter (Signed)
Spoke with the patient to see if he was taken his Jakafi . The patient states that Dr Evelene Croon was tapering him down from the [facility] so she can start him on a new medication. He does states this is his 1st month doing 20 mg daily. The patient states he is unaware when he will start a new medication but he is waiting to hear back from dr Evelene Croon office for more instructions. The patient was understanding.

## 2019-08-23 ENCOUNTER — Telehealth: Admit: 2019-08-23 | Discharge: 2019-08-24 | Payer: MEDICARE | Attending: Adult Health | Primary: Adult Health

## 2019-08-23 ENCOUNTER — Encounter: Payer: Self-pay | Admitting: Hematology and Oncology

## 2019-08-23 DIAGNOSIS — D45 Polycythemia vera: Secondary | ICD-10-CM | POA: Diagnosis not present

## 2019-08-23 DIAGNOSIS — D508 Other iron deficiency anemias: Secondary | ICD-10-CM | POA: Diagnosis not present

## 2019-08-23 DIAGNOSIS — D471 Chronic myeloproliferative disease: Secondary | ICD-10-CM | POA: Diagnosis not present

## 2019-08-23 MED ORDER — FEDRATINIB (INREBIC) 100 MG CAPSULE
ORAL_CAPSULE | Freq: Every day | ORAL | 0 refills | 30.00000 days | Status: CP
Start: 2019-08-23 — End: ?
  Filled 2019-09-05: qty 120, 30d supply, fill #0

## 2019-08-23 NOTE — Progress Notes (Signed)
Ruben [name] [facility]  [address], [address] [place], [place] [address] Phone: [phone]  Fax: [phone]   Telemedicine Office Visit:  08/25/2019  Referring physician: [name], MD  I connected with Ruben [name] on 08/25/2019 at 9:13 AM by videoconferencing and verified that I was speaking with the correct person using 2 identifiers.  The patient was at home.  I discussed the limitations, risk, security and privacy concerns of performing an evaluation and management service by videoconferencing and the availability of in person appointments.  I also discussed with the patient that there may be a patient responsible charge related to this service.  The patient expressed understanding and agreed to proceed.   Chief Complaint: Ruben [name] is a 73 y.o. male  with polycythemia rubra vera (PV) who is seen for 1 month assessment.  HPI: The patient was last seen in the hematology clinic on 07/28/2019. At that time, he felt good.  Foot drop had improved.  Exam revealed no adenopathy or hepatosplenomegaly.  CBC at [facility] revealed a hematocrit 35.0, hemoglobin 10.8, platelets 253,000, and WBC 58,600 (ANC 50,400).  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  Myeloid mutation panel revealed the patient's previously reported variants.  Cytogenetics were pending. Jakafi was tapered to 20 mg BID beginning 07/29/2019.  We discussed plan for fedratinib off study or fedratinib on the FREEDOM001 study. I encouraged protocol enrollment.  He was seen via telemedicine visit on 08/23/2019 by Ruben [name], AGNP at [facility] on 08/23/2019. Patient was notified that the additional drug that he would have had access to on the clinical trial (luspatercept for anemia) was pulled from the study.  The study was also pulled.  Labs on 08/24/2019: hematocrit 37.4, hemoglobin 11.2, MCV  85.0, platelets 194,000, WBC 43,800 (ANC 1,300, ALC 5,300). Ferritin was 22. Creatinine 1.66. LDH 888. Uric acid was 7.0.   During the interim, he has been doing well. His wife notes that he has a bit more energy. He has gained weight as he is eating well. He continues to take oral iron three times a week. Patient notes having 6 tablets of Jakafi left for treatment this week.  He is currently on a Jakafi taper schedule.  He does not feel any different on the lower dose Jakafi.  The patient's wife notes that they are not in favor of the care they received at [facility].  The patient and his wife would like to receive fedratinib through the [facility] clinic.    Past Medical History:  Diagnosis Date  . Blood dyscrasia   . BPH (benign prostatic hyperplasia)   . Cancer ([place])    SKIN/ POLYCYTHEMIA VERA  . Chronic kidney disease    RENAL INSUFF (40%)  . Crohn disease ([place])   . Crohn's disease ([place])   . Glaucoma   . Glaucoma   . Gout   . History of chicken pox   . Myelofibrosis ([place])   . Nosebleed   . RBBB   . Right bundle branch block     Past Surgical History:  Procedure Laterality Date  . CATARACT EXTRACTION W/PHACO Left 07/13/2018   Procedure: CATARACT EXTRACTION PHACO AND INTRAOCULAR LENS PLACEMENT (IOC);  Surgeon: Ruben Robson, MD;  Location: [facility] ORS;  Service: Ophthalmology;  Laterality: Left;  [place] 00:39  CDE 5.14 Fluid pack lot # [id number]  . CATARACT EXTRACTION W/PHACO Right 08/03/2018   Procedure:  CATARACT EXTRACTION PHACO AND INTRAOCULAR LENS PLACEMENT (IOC);  Surgeon: Ruben Robson, MD;  Location: [facility] ORS;  Service: Ophthalmology;  Laterality: Right;  [place] 00:43.4 CDE 5.23 Fluid pack Lot # [id number]  . COLONOSCOPY WITH PROPOFOL N/A 03/27/2017   Procedure: COLONOSCOPY WITH PROPOFOL;  Surgeon: Ruben Silvas, MD;  Location: [facility] ENDOSCOPY;  Service: Endoscopy;  Laterality: N/A;  . MOHS SURGERY     EAR  . PROSTATE SURGERY  2008   Prostate Biopsy in [place] to elevated  PSA.  per patient normal  . Skin Lesion Basal cell removed    . wart removal     from eyelid    Family History  Problem Relation Age of Onset  . Cancer Mother        Liver  . Cancer Father        Prostate  . Cancer Sister        Hodgkins lymphoma  . Cancer Paternal Aunt        Breast    Social History:  reports that he quit smoking about 15 years ago. His smoking use included cigarettes. He has a 5.00 pack-year smoking history. He has never used smokeless tobacco. He reports current alcohol use of about 4.0 - 5.0 standard drinks of alcohol per week. He reports that he does not use drugs.  He previously worked for [employer]. He no longer volunteers for [employer]. The patient's wife isJudith([phone]).He lives in [place]. The patient is accompanied by Ruben [name] today.  Participants in the patient's visit and their role in the encounter included the patient, Ruben [name], and Ruben [name], Therapist, sports, today.  The intake visit was provided by Ruben Budge, RN.  Allergies: No Known Allergies  Current Medications: Current Outpatient Medications  Medication Sig Dispense Refill  . acetaminophen (TYLENOL) 500 MG tablet Take 500 mg by mouth daily as needed for moderate pain.    Ruben [name] allopurinol (ZYLOPRIM) 300 MG tablet Take 0.5 tablets (150 mg total) by mouth daily. 30 tablet 1  . Cholecalciferol (VITAMIN D3) 1000 units CAPS Take 1,000 Units by mouth daily at 12 noon.     . Cyanocobalamin (RA VITAMIN B-12 TR) 1000 MCG TBCR Take 1,000 mcg by mouth daily at 12 noon.     . ferrous sulfate 325 (65 FE) MG tablet Take 325 mg by mouth every Monday, Wednesday, and Friday.     . finasteride (PROSCAR) 5 MG tablet Take 1 tablet (5 mg total) by mouth every evening. 90 tablet 3  . folic acid (FOLVITE) 1 MG tablet Take 1 mg by mouth daily at 12 noon.     . latanoprost (XALATAN) 0.005 % ophthalmic solution Place 1 drop into both eyes at bedtime.     . Multiple Vitamins-Minerals (CENTRUM SILVER  ULTRA MENS) TABS Take 1 tablet by mouth daily at 12 noon.     . Omega-3 Fatty Acids (KP FISH OIL) 1200 MG CAPS Take 1,200 mg by mouth daily.     . ruxolitinib phosphate (JAKAFI) 20 MG tablet Take 1 tablet (20 mg total) by mouth 2 (two) times daily. 60 tablet 0  . sodium bicarbonate 650 MG tablet Take 650 mg by mouth 2 (two) times daily.    . timolol (TIMOPTIC) 0.5 % ophthalmic solution Place 1 drop into both eyes daily. In AM  5  . Fedratinib HCl (INREBIC) 100 MG CAPS Take by mouth.    . ruxolitinib phosphate (JAKAFI) 5 MG tablet Take 5 mg by mouth. Take 3 tablets twice daily for  3 days, then 2 tablets twice daily for 3 days, then 1 tablet twice daily for 3 days, then 1 tablet daily for 3 days, then stop     No current facility-administered medications for this visit.     Review of Systems  Constitutional: Negative.  Negative for chills, diaphoresis, fever, malaise/fatigue and weight loss (weight gain).       Doing well. More energy.  HENT: Negative.  Negative for congestion, ear pain, hearing loss, nosebleeds, sinus pain and sore throat.   Eyes: Negative for blurred vision, double vision and photophobia.       S/p cataract surgery.  Uses reading glasses.  Respiratory: Negative.  Negative for cough, hemoptysis, sputum production and shortness of breath.   Cardiovascular: Negative.  Negative for chest pain, palpitations, orthopnea, leg swelling and PND.  Gastrointestinal: Negative for abdominal pain, blood in stool, constipation, diarrhea, heartburn, melena, nausea and vomiting.       Crohn's (no symptoms). Eating well.  Genitourinary: Negative.  Negative for dysuria, frequency, hematuria and urgency.  Musculoskeletal: Negative.  Negative for back pain, falls, joint pain, myalgias and neck pain.  Skin: Negative.  Negative for itching and rash.  Neurological: Negative for dizziness, tingling, tremors, sensory change, speech change, focal weakness (right foot drop, improving), weakness and  headaches.  Endo/Heme/Allergies: Negative for environmental allergies. Bruises/bleeds easily.  Psychiatric/Behavioral: Positive for memory loss (mild). Negative for depression and substance abuse. The patient is not nervous/anxious and does not have insomnia.   All other systems reviewed and are negative.   Performance status (ECOG): 1  Physical Exam  Constitutional: He is oriented to person, place, and time. He appears well-developed and well-nourished. No distress.  HENT:  Head: Normocephalic and atraumatic.  Gray hair.  Eyes: Conjunctivae and EOM are normal. No scleral icterus.  Glasses.  Blue eyes.  Neurological: He is alert and oriented to person, place, and time.  Skin: He is not diaphoretic.  Psychiatric: He has a normal mood and affect. His behavior is normal. Judgment and thought content normal.  Nursing note reviewed.   Appointment on 08/24/2019  Component Date Value Ref Range Status  . Uric Acid, Serum 08/24/2019 7.0  3.7 - 8.6 mg/dL Final   Performed at [facility], [address] [place], [place] [address]  . Sodium 08/24/2019 138  135 - 145 mmol/L Final  . Potassium 08/24/2019 4.4  3.5 - 5.1 mmol/L Final  . Chloride 08/24/2019 104  98 - 111 mmol/L Final  . CO2 08/24/2019 25  22 - 32 mmol/L Final  . Glucose, Bld 08/24/2019 101* 70 - 99 mg/dL Final  . BUN 08/24/2019 28* 8 - 23 mg/dL Final  . Creatinine, Ser 08/24/2019 1.66* 0.61 - 1.24 mg/dL Final  . Calcium 08/24/2019 9.2  8.9 - 10.3 mg/dL Final  . Total Protein 08/24/2019 8.5* 6.5 - 8.1 g/dL Final  . Albumin 08/24/2019 4.1  3.5 - 5.0 g/dL Final  . AST 08/24/2019 39  15 - 41 U/L Final  . ALT 08/24/2019 15  0 - 44 U/L Final  . Alkaline Phosphatase 08/24/2019 80  38 - 126 U/L Final  . Total Bilirubin 08/24/2019 0.7  0.3 - 1.2 mg/dL Final  . GFR calc non Af Amer 08/24/2019 41* >60 mL/min Final  . GFR calc Af Amer 08/24/2019 47* >60 mL/min Final  . Anion gap 08/24/2019 9  5 - 15 Final   Performed at  [facility], [address] [place], [place] [address]  .  LDH 08/24/2019 888* 98 - 192 U/L Final   Performed at [facility], [address] [place], [place] [address]  . Ferritin 08/24/2019 22* 24 - 336 ng/mL Final   Performed at [facility], [address] [place], [place] [address]  . WBC 08/24/2019 43.8* 4.0 - 10.5 K/uL Final  . RBC 08/24/2019 4.40  4.22 - 5.81 MIL/uL Final  . Hemoglobin 08/24/2019 11.2* 13.0 - 17.0 g/dL Final  . HCT 08/24/2019 37.4* 39.0 - 52.0 % Final  . MCV 08/24/2019 85.0  80.0 - 100.0 fL Final  . MCH 08/24/2019 25.5* 26.0 - 34.0 pg Final  . MCHC 08/24/2019 29.9* 30.0 - 36.0 g/dL Final  . RDW 08/24/2019 19.2* 11.5 - 15.5 % Final  . Platelets 08/24/2019 194  150 - 400 K/uL Final  . nRBC 08/24/2019 1.4* 0.0 - 0.2 % Final  . Neutrophils Relative % 08/24/2019 56  % Final  . Neutro Abs 08/24/2019 28.9* 1.7 - 7.7 K/uL Final  . Band Neutrophils 08/24/2019 10  % Final  . Lymphocytes Relative 08/24/2019 12  % Final  . Lymphs Abs 08/24/2019 5.3* 0.7 - 4.0 K/uL Final  . Monocytes Relative 08/24/2019 3  % Final  . Monocytes Absolute 08/24/2019 1.3* 0.1 - 1.0 K/uL Final  . Eosinophils Relative 08/24/2019 1  % Final  . Eosinophils Absolute 08/24/2019 0.4  0.0 - 0.5 K/uL Final  . Basophils Relative 08/24/2019 0  % Final  . Basophils Absolute 08/24/2019 0.0  0.0 - 0.1 K/uL Final  . RBC Morphology 08/24/2019 HIDE   Final  . nRBC 08/24/2019 2* 0 /100 WBC Final  . Metamyelocytes Relative 08/24/2019 5  % Final  . Myelocytes 08/24/2019 10  % Final  . Promyelocytes Relative 08/24/2019 1  % Final  . Blasts 08/24/2019 2  % Final  . Abs Immature Granulocytes 08/24/2019 7.00* 0.00 - 0.07 K/uL Final  . Polychromasia 08/24/2019 PRESENT   Final  . Platelet Morphology 08/24/2019 NORMAL   Final   Performed at [facility], [address] [place], [place] [address]    Assessment:  TRAVARUS TRUDO is a 73 y.o. male with polycythemia  rubra vera. He has had polycythemia dating back to 2013. Hematocrit was 62.1 with a hemoglobin of 19.8 on 03/28/2015. JAK 2 testing on 03/28/2015 revealed [id number] mutation. Erythropoietin level was 1.1 (low). 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  He began a phlebotomyprogram on 03/28/2015 to maintain a hematocrit goal of <45.Last phlebotomywason 05/02/2015. He is on a baby aspirin.  CBC on 11/28/2015 revealed a hematocrit of 39.3, hemoglobin 11.9, platelets 463,000, WBC 32,000 with an ANC of 25,000. Differential included 71% segs, 4% lymphocytes and 17% monocytes. Peripheral smear revealed leukocytosis with predominantly mature neutrophils, increased monocytes and rare blasts (<1%).  Bone marrowon 12/14/2015 revealed a persistent myeloproliferative neoplasm with myelofibrosis and alterations compatible with myelodysplatic progression. Marrow was packed (95-100% cellularity) with pan myelosis, multi-lineage dyspoiesis, and no significant increase in blasts. There was moderate to focally marked reticulin fibrosis (grade 2-3/3). Storage iron was not identified. Flow cytometry revealed non-specific atypical myeloid findings with no increase in blasts. 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 Cytogenetics were normal (46, XY).  Bone marrowon 04/07/2016 at [facility] revealed a hypercellular marrow (>95%) with persistent involvement by myeloid neoplasm with 5% blasts. There was mild reticulin fibrosis. FISH t(9;22) results were normal. Myeloid mutation panel revealed JAK2 V617F, IDH2, RUNX1, and SRSF2 consistent with clonal  evolution. Cytogenetics are pending.  Bone marrowon 08/09/2018 revealed a hypercellular bone marrow (>95%) with persistent involvement by myeloid neoplasm with <1% blasts by manual touch preparation differential. There was marked reticulin fibrosis with focal collagen deposition.  Cytogenetics were normal (46, XY). - Myeloid mutation panel study is pending.  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  Myeloid mutation panel revealed the patient's previously reported variants in IDH2, JAK2, KRAS, NRAS and SRSF2 were again identified.  There are no new variants.  Cytogenetics are pending.  He was briefly on hydroxyurea500 mg a day (12/05/2015 - 12/19/2015 and 06/25/2016 - 07/03/2016). Platelet count increased to 1.18 million and white count to 54,800 without increased blasts on 06/25/2016. CBC on 07/02/2016 revealed a platelets of 429,000 with a white count 30,600. He began allopurinolon 12/19/2015. He began Jakafi 20 mg BID on 05/28/2016. He has been onJakafi25 mg BID since09/12/2016.  LDHis followed: 466 on 01/02/2016, 596 on 05/23/2016, 663 on 07/30/2016, 522 on 09/24/2016, 638 on 01/05/2017, 625 on 02/25/2017, 1144 on 06/10/2017, 720 on 06/17/2017, 728 on 06/24/2017, and 724 on 01/06/2018. Triglycerideswere 183 (<150) on 08/20/2016, 180 on 01/05/2017, 312 on 06/17/2017, 165 on 11/25/2017, 113 on 02/17/2018.  He has urinary retention. Renal ultrasoundon 07/28/2016 revealed bilateral hydronephrosis (right >left) and a large postvoid residual (2 liters) suggesting bladder dysfunction or outlet obstruction. He underwent temporary Foley catheter placement. He performs I/O self catheterizations. He is on Flomax and finasteride. PSAwas 2.02 on 07/09/2016.  He is followed by GI (Dr. Vira Agar) for a history of polypsand Crohn's disease.He had an unremarkable colonoscopy in 03/2017.AbdomenandpelvisCTon 04/03/2019 revealed mild wall thickening of portions of a loop of small bowel within the pelvis with intervening areas of mildly dilated small bowel,consistent with active Crohn's disease. No evidence of an  abscess or fistula. No other evidence of bowel inflammation.There was splenomegaly(16.7 x 7.7 x 15.6 cm), developed since12/11/2011.There was mild bladder wall thickeningandenlargement of the prostate. Chronic bladder outlet obstructionwas felt to bethe etiology of bladder wall thickening.  He has chronicrenal insufficiency(Cr 1.85; CrCl 36 ml/min). SPEP on 09/23/2016 was negative. Spot urine revealed 17.4% of 27.3 mg/dL.of a monoclonal protein. 24 hour urine on 10/16/2016 revealed no monoclonal protein. Renal function transiently decreased in 06/2017 after Bactrim. He is followed by nephrology and urology.  He has a history of epistaxis. Normal studies included: PT, PTT, von Willebrand panel, and platelet function assay (PFA).  He has right foot drop. 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 There was no non-contrast MRI evidence of brain metastasis. There was only mild for age nonspecific cerebral white signal changes. Lumbar spine MRIon 06/09/2019 showed diffusely abnormal marrow signal in the visible skeleton, as seen on the brain MRI. There was no pathologic fracture and no extraosseous extension of tumor on the lumbar spine or visible sacrum. In conjunction with the splenomegaly by CT in [place] infiltration byleukemia/lymphoma may be the most likely etiology. Differential considerations included multiple myeloma and metastatic disease unknown primary. The involvement of the pelvis should be amenable to bone marrow biopsy.There was superimposed mild for age lumbar spine degeneration with no spinal stenosis.There was mild degenerative L4 and L5 neural foraminal stenosis.  Symptomatically, he is doing well.  He is currently tapering Jakafi with plans to start fedratinib.  Foot drop is improving.  Plan: 1.   Review labs from 08/24/2019. 2. Myeloproliferative neoplasm /  Polycythemia rubra vera Clinically,he  continues to do well.             Hematocrit33.7. Hemoglobin10.4. Platelets 185,000. TDS28,768 ([place] 22,700) on 07/05/2019.              Hematocrit 35.0.  Hemoglobin 10.8.  Platelets 253,000.  WBC 58,600 ([place] 50,400) on 07/14/2019.             Hematocrit 37.4.  Hemoglobin 11.2.  Platelets 194,000.  WBC 43,800 ([place] 28,900) on 08/24/2019.  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                           Myeloid mutation panel revealed the patient's previously reported variants.                           Cytogenetics are currently not available.             Continue Jakai taper.  Contact Dr Adriana Simas at [facility] regarding fedratinib.   Contact Alyson leonard, oral chemotherapy pharmacist, regarding obtaining fedratinib locally. Continue allopurinol.   Uric acid 7.0 (normal).       3. Iron deficiency Hematocrit37.2. Hemoglobin 11.2. MCV85.0. Ferritinwas22. Continue oral iron 3 days/week (Mondays, Wednesdays and Fridays). Continue to monitor. 4.Crohn's disease Disease remains quiescent. Continue to monitor. 5.Right foot drop Etiology unclear. Symptomatically, his foot drop has improved.              He is followed by Dr. Manuella Ghazi, neurologist.  Continue to monitor. 6.RTC in 2 weeks for MD assessment, labs (CBC with diff, CMP), and initiation of fedratinib.  Addendum:  I spoke with Dr Adriana Simas.  Paperwork is ongoing at [facility] to obtain fedratinib for him.  No plan to duplicate approval/coverage as grant assistance pending.  Will plan to follow-up and monitor patient locally.  I discussed the assessment and treatment plan with the patient.  The patient was provided an opportunity to ask questions and all were answered.  The patient agreed with the plan  and demonstrated an understanding of the instructions.  The patient was advised to call back or seek an in person evaluation if the symptoms worsen or if the condition fails to improve as anticipated.  I provided 25 minutes (9:13 AM - 9:38 AM) of face-to-face video visit time during this this encounter and > 50% was spent counseling as documented under my assessment and plan.  I provided these services from the [facility] office.   Nolon Stalls, MD, PhD  08/25/2019, 9:13 AM  I, Selena Batten, am acting as scribe for [place]. Mike Gip, MD, PhD.  I, Melissa C. Mike Gip, MD, have reviewed the above documentation for accuracy and completeness, and I agree with the above.

## 2019-08-24 ENCOUNTER — Inpatient Hospital Stay: Payer: Medicare Other | Attending: Hematology and Oncology

## 2019-08-24 ENCOUNTER — Other Ambulatory Visit: Payer: Self-pay

## 2019-08-24 ENCOUNTER — Telehealth: Payer: Self-pay | Admitting: Pharmacist

## 2019-08-24 DIAGNOSIS — M5136 Other intervertebral disc degeneration, lumbar region: Secondary | ICD-10-CM | POA: Insufficient documentation

## 2019-08-24 DIAGNOSIS — Z8042 Family history of malignant neoplasm of prostate: Secondary | ICD-10-CM | POA: Diagnosis not present

## 2019-08-24 DIAGNOSIS — M48061 Spinal stenosis, lumbar region without neurogenic claudication: Secondary | ICD-10-CM | POA: Insufficient documentation

## 2019-08-24 DIAGNOSIS — R161 Splenomegaly, not elsewhere classified: Secondary | ICD-10-CM | POA: Insufficient documentation

## 2019-08-24 DIAGNOSIS — Z803 Family history of malignant neoplasm of breast: Secondary | ICD-10-CM | POA: Diagnosis not present

## 2019-08-24 DIAGNOSIS — Z87891 Personal history of nicotine dependence: Secondary | ICD-10-CM | POA: Diagnosis not present

## 2019-08-24 DIAGNOSIS — D45 Polycythemia vera: Secondary | ICD-10-CM

## 2019-08-24 DIAGNOSIS — M21371 Foot drop, right foot: Secondary | ICD-10-CM | POA: Insufficient documentation

## 2019-08-24 DIAGNOSIS — K5090 Crohn's disease, unspecified, without complications: Secondary | ICD-10-CM | POA: Diagnosis not present

## 2019-08-24 DIAGNOSIS — Z800 Family history of malignant neoplasm of digestive organs: Secondary | ICD-10-CM | POA: Diagnosis not present

## 2019-08-24 DIAGNOSIS — Z807 Family history of other malignant neoplasms of lymphoid, hematopoietic and related tissues: Secondary | ICD-10-CM | POA: Diagnosis not present

## 2019-08-24 DIAGNOSIS — D509 Iron deficiency anemia, unspecified: Secondary | ICD-10-CM | POA: Diagnosis not present

## 2019-08-24 DIAGNOSIS — N189 Chronic kidney disease, unspecified: Secondary | ICD-10-CM | POA: Insufficient documentation

## 2019-08-24 DIAGNOSIS — D471 Chronic myeloproliferative disease: Secondary | ICD-10-CM | POA: Diagnosis not present

## 2019-08-24 LAB — COMPREHENSIVE METABOLIC PANEL
ALT: 15 U/L (ref 0–44)
AST: 39 U/L (ref 15–41)
Albumin: 4.1 g/dL (ref 3.5–5.0)
Alkaline Phosphatase: 80 U/L (ref 38–126)
Anion gap: 9 (ref 5–15)
BUN: 28 mg/dL — ABNORMAL HIGH (ref 8–23)
CO2: 25 mmol/L (ref 22–32)
Calcium: 9.2 mg/dL (ref 8.9–10.3)
Chloride: 104 mmol/L (ref 98–111)
Creatinine, Ser: 1.66 mg/dL — ABNORMAL HIGH (ref 0.61–1.24)
GFR calc Af Amer: 47 mL/min — ABNORMAL LOW (ref >60)
GFR calc non Af Amer: 41 mL/min — ABNORMAL LOW (ref >60)
Glucose, Bld: 101 mg/dL — ABNORMAL HIGH (ref 70–99)
Potassium: 4.4 mmol/L (ref 3.5–5.1)
Sodium: 138 mmol/L (ref 135–145)
Total Bilirubin: 0.7 mg/dL (ref 0.3–1.2)
Total Protein: 8.5 g/dL — ABNORMAL HIGH (ref 6.5–8.1)

## 2019-08-24 LAB — CBC WITH DIFFERENTIAL/PLATELET
Abs Immature Granulocytes: 7 10*3/uL — ABNORMAL HIGH (ref 0.00–0.07)
Band Neutrophils: 10 %
Basophils Absolute: 0 10*3/uL (ref 0.0–0.1)
Basophils Relative: 0 %
Blasts: 2 %
Eosinophils Absolute: 0.4 10*3/uL (ref 0.0–0.5)
Eosinophils Relative: 1 %
HCT: 37.4 % — ABNORMAL LOW (ref 39.0–52.0)
Hemoglobin: 11.2 g/dL — ABNORMAL LOW (ref 13.0–17.0)
Lymphocytes Relative: 12 %
Lymphs Abs: 5.3 10*3/uL — ABNORMAL HIGH (ref 0.7–4.0)
MCH: 25.5 pg — ABNORMAL LOW (ref 26.0–34.0)
MCHC: 29.9 g/dL — ABNORMAL LOW (ref 30.0–36.0)
MCV: 85 fL (ref 80.0–100.0)
Metamyelocytes Relative: 5 %
Monocytes Absolute: 1.3 10*3/uL — ABNORMAL HIGH (ref 0.1–1.0)
Monocytes Relative: 3 %
Myelocytes: 10 %
Neutro Abs: 28.9 10*3/uL — ABNORMAL HIGH (ref 1.7–7.7)
Neutrophils Relative %: 56 %
Platelet Morphology: NORMAL
Platelets: 194 10*3/uL (ref 150–400)
Promyelocytes Relative: 1 %
RBC: 4.4 MIL/uL (ref 4.22–5.81)
RDW: 19.2 % — ABNORMAL HIGH (ref 11.5–15.5)
WBC: 43.8 10*3/uL — ABNORMAL HIGH (ref 4.0–10.5)
nRBC: 1.4 % — ABNORMAL HIGH (ref 0.0–0.2)
nRBC: 2 /100 WBC — ABNORMAL HIGH

## 2019-08-24 LAB — URIC ACID: Uric Acid, Serum: 7 mg/dL (ref 3.7–8.6)

## 2019-08-24 LAB — LACTATE DEHYDROGENASE: LDH: 888 U/L — ABNORMAL HIGH (ref 98–192)

## 2019-08-24 LAB — FERRITIN: Ferritin: 22 ng/mL — ABNORMAL LOW (ref 24–336)

## 2019-08-24 MED ORDER — RUXOLITINIB PHOSPHATE 5 MG PO TABS: ORAL_TABLET | ORAL | 0 refills | Status: DC

## 2019-08-24 NOTE — Progress Notes (Signed)
Confirmed Name, DOB, and Address. Denies any concerns.  

## 2019-08-24 NOTE — Telephone Encounter (Signed)
Oral Chemotherapy Pharmacist Encounter   Patient seen by [facility] hematology provider who prescribed Jakafi taper.   Patient instructed to taper as followed, using 46m Jakafi tablets: Take 3 tablets twice daily for 3 days, then 2 tablets twice daily for 3 days, then 1 tablet twice daily for 3 days, then 1 tablet daily for 3 days.  He was also instructed to take the remainder of his 269mtablets twice daily, then start this taper.   AlDarl PikesPharmD, BCPS, BCPhoenix Behavioral Hospitalematology/Oncology Clinical Pharmacist [phone]/10/2018 2:19 PM

## 2019-08-25 ENCOUNTER — Other Ambulatory Visit: Payer: Medicare Other

## 2019-08-25 ENCOUNTER — Encounter: Payer: Self-pay | Admitting: Hematology and Oncology

## 2019-08-25 ENCOUNTER — Telehealth: Payer: Self-pay | Admitting: Pharmacist

## 2019-08-25 ENCOUNTER — Inpatient Hospital Stay (HOSPITAL_BASED_OUTPATIENT_CLINIC_OR_DEPARTMENT_OTHER): Payer: Medicare Other | Admitting: Hematology and Oncology

## 2019-08-25 DIAGNOSIS — D45 Polycythemia vera: Secondary | ICD-10-CM | POA: Diagnosis not present

## 2019-08-25 DIAGNOSIS — D500 Iron deficiency anemia secondary to blood loss (chronic): Secondary | ICD-10-CM

## 2019-08-25 MED FILL — JAKAFI 5 MG TABLET: 5 | 20 days supply | Qty: 40 | Fill #0

## 2019-08-25 NOTE — Telephone Encounter (Signed)
Oral Chemotherapy Pharmacist Encounter   Spoke with Ruben [name] today. He will take the remainder of the Jakafi 81m tablets he has at home twice daily then start his taper. His taper will begin on Sunday 08/28/19. I reviewed the taper instruction with [name] He will taper using Jakafi 57mtablets. He will take 3 tablets twice daily for 3 days, then 2 tablets twice daily for 3 days, then 1 tablet twice daily for 3 days, then 1 tablet daily for 3 days.   Of note, [facility] [place] his Jakafi with the shipment today but will send him the remainder of the tablets. [name] his understanding.  I also let me know that we will start looking into Inrebic (fedratinib) access and keep him updated. He is to start fedratinib following his Jakafi taper.   AlDarl PikesPharmD, BCPS, BCOakdale Community Hospitalematology/Oncology Clinical Pharmacist [facility]/AP Oral [phone]/11/2018 12:16 PM

## 2019-08-25 NOTE — Telephone Encounter (Signed)
Oral Oncology Pharmacist Encounter  Received new prescription for Inrebic (fedratinib) for the treatment of myeloproliferative neoplasm, planned duration until disease progression or unacceptable drug toxicity.  CBC/CMP from 08/24/2019 assessed, no relevant lab abnormalities. Recommend obtaining a baseline thiamine/vitamin B1 level.  Current medication list in Epic reviewed, no DDIs with fedratinib identified.  Oral Oncology Clinic will continue to follow for insurance authorization, copayment issues, initial counseling and start date.  Darl Pikes, PharmD, BCPS, [facility] Hematology/Oncology Clinical Pharmacist [facility]/HP/AP [facility] [place] [facility] [phone]  08/25/2019 1:18 PM

## 2019-08-30 ENCOUNTER — Encounter: Payer: Self-pay | Admitting: Hematology and Oncology

## 2019-08-30 MED ORDER — RUXOLITINIB 5 MG TABLET
ORAL_TABLET | ORAL | 0 refills | 0 days | Status: CP
Start: 2019-08-30 — End: 2019-09-11

## 2019-09-01 NOTE — Unmapped (Addendum)
[facility] Specialty Medication Onboarding    Specialty Medication: INREBIC  Prior Authorization: Approved   Financial Assistance: Yes - grant approved as secondary   Final Copay/Day Supply: $0 / 30 DAYS    Insurance Restrictions: None     Notes to Pharmacist: Per provider comments: TEST CLAIM, please do not dispense yet, thanks.    The triage team has completed the benefits investigation and has determined that the patient is able to fill this medication at [facility]. Please contact the patient to complete the onboarding or follow up with the prescribing physician as needed.

## 2019-09-01 NOTE — Unmapped (Signed)
Onboarding not completed at this time due to script sent for Benefits Investigation only.  [facility] will profile prescription until notification from provider to onboard.    Specialty Medication: [facility]  Prior Authorization: Approved   Financial Assistance: Yes - grant approved as secondary   Final Copay/Day Supply: $0 / 30 DAYS    [name], PharmD  [facility]

## 2019-09-02 ENCOUNTER — Encounter: Payer: Self-pay | Admitting: Hematology and Oncology

## 2019-09-05 DIAGNOSIS — H401132 Primary open-angle glaucoma, bilateral, moderate stage: Secondary | ICD-10-CM | POA: Diagnosis not present

## 2019-09-05 MED FILL — INREBIC 100 MG CAPSULE: 30 days supply | Qty: 120 | Fill #0 | Status: AC

## 2019-09-05 NOTE — Unmapped (Signed)
[facility]   Patient Onboarding/Medication Counseling    Howard Villarreal is a 73 y.o. 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  I am speaking to the patient.    Was a Nurse, learning disability used for this call? No    Verified patient's date of birth / HIPAA.    Specialty medication(s) to be sent: Hematology/Oncology: Inbrebic      Non-specialty medications/supplies to be sent: none      Medications not needed at this time: none       Inrebic (fedratinib)    Medication & Administration     Dosage: Take 4 capsules (400 mg) by mouth daily.    Administration: This is an oral medication and may be taken with or without food but taken with a high fat meal may lessen your chances of developing nausea and vomiting.  Swallow the tablet whole and do not break, crush, or chew the tablet.     Adherence/Missed dose instructions:  If you miss a dose, skip that dose.  The next scheduled dose should be taken at the next scheduled time. Do not take two doses at one time to make up for a missed dose.  If you vomit after taking your dose, do not take another dose and take next dose at its normally scheduled time. DO NOT STOP ABRUPTLY without talking to your provider as the dose may need to be tapered.      Goals of Therapy     Prevent disease progression    Side Effects & Monitoring Parameters   ? Nausea/vomiting  ? Diarrhea  ? Fatigue  ? Infection precautions (fever >100.4, chills, sore throat, cold)  ? Headache  ? Muscle spasms  ? Anemia  ? Pancreas problems (bloating, indigestion, loss of appetite, abdominal pain)  ? Liver toxicity (yellowing of skin or eyes, dark urine)  ? Kidney toxicity (decreased urine output, blood  in urine, swelling in ankles)  ? Brain and nervous system problems (confusion, memory problems, drowsiness, balance problems, vision problems such as blurred vision or double vision)    The following side effects should be reported to the provider: ? Heartbeat that doesn't feel normal (heart feels like it's racing, skipping a beat or fluttering)  ? Decrease in urination, change in color of urine, blood in urine   ? Yellowing of skin or eyes, your urine appears dark or brown or pain in abdomen  ? Signs of allergic reaction (rash, hives, shortness of breath)  ? Neurological symptoms (confusion, memory problems, drowsiness, balance problems, vision problems such as blurred vision or double vision)      Contraindications, Warnings, & Precautions     ? Do not eat grapefruit or grapefruit juice  ? Exposure of an unborn child to this medication could cause birth defects so you should not become pregnant or father a child while on this medication.  Effective birth control should be used during treatment and for at least 1 month after treatment    Drug/Food Interactions     ? Medication list reviewed in Epic. The patient was instructed to inform the care team before taking any new medications or supplements including over the counter medications, vitamins, and herbal supplements. Inrebic may increase the serum concentration of Ruxolitinib (pt is being tapered off ruxolitinib).     Storage, Handling Precautions, & Disposal     ? This medication should be stored at room temperature and in a dry location. Keep out of  reach of others including children and pets. Keep the medicine in the original container with a child-proof top (no pillbox). Do not throw away or flush unused medication down the toilet or sink. This drug is considered hazardous and should be handled as little as possible.  If someone else helps with medication administration, they should wear gloves    Current Medications (including OTC/herbals), Comorbidities and Allergies     Current Outpatient Medications   Medication Sig Dispense Refill   ??? acetaminophen (TYLENOL) 500 MG tablet Take 500 mg by mouth every eight (8) hours as needed. ??? allopurinoL (ZYLOPRIM) 300 MG tablet Take 150 mg by mouth daily.      ??? cholecalciferol, vitamin D3, 1,000 unit capsule Take 1,000 Units by mouth daily with evening meal.     ??? cyanocobalamin 1000 MCG tablet Take 1,000 mcg by mouth daily.      ??? fedratinib (INREBIC) 100 mg cap Take 4 capsules (400 mg) by mouth daily. 120 capsule 0   ??? ferrous sulfate 325 (65 FE) MG EC tablet Take 1 tablet by mouth Three (3) times a week.     ??? finasteride (PROSCAR) 5 mg tablet Take 5 mg by mouth daily with evening meal.     ??? fish oil-dha-epa 1,200-144-216 mg cap Take 1 capsule by mouth daily with evening meal.      ??? folic acid (FOLVITE) 1 MG tablet Take 1 mg by mouth daily.      ??? latanoprost (XALATAN) 0.005 % ophthalmic solution 1 drop nightly.     ??? MULTIVIT-MIN/FA/LYCOPEN/LUTEIN (CENTRUM SILVER ULTRA MEN'S ORAL) Take 1 tablet by mouth daily.     ??? ruxolitinib (JAKAFI) 5 mg tablet Take 3 tablets (15 mg total) by mouth Two (2) times a day for 3 days, THEN 2 tablets (10 mg total) Two (2) times a day for 3 days, THEN 1 tablet (5 mg total) Two (2) times a day for 3 days, THEN 1 tablet (5 mg total) daily for 3 days. 60 tablet 0   ??? sodium bicarbonate 650 mg tablet Take 650 mg by mouth Two (2) times a day.     ??? timolol (BETIMOL) 0.5 % ophthalmic solution 1 drop Two (2) times a day.     ??? timolol (TIMOPTIC) 0.5 % ophthalmic solution Apply 1 drop to eye daily.       No current facility-administered medications for this visit.        No Known Allergies    Patient Active Problem List   Diagnosis   ??? Polycythemia vera (CMS-HCC)   ??? MPN (myeloproliferative neoplasm) (CMS-HCC)   ??? Iron deficiency anemia       Reviewed and up to date in Epic.    Appropriateness of Therapy     Is medication and dose appropriate based on diagnosis? Yes    Prescription has been clinically reviewed: Yes    Baseline Quality of Life Assessment      How many days over the past month did your polycythemia vera keep you from your normal activities? 0 Financial Information     Medication Assistance provided: Prior Authorization and Psychologist, clinical    Anticipated copay of $0 / 30 DAYS reviewed with patient. Verified delivery address.    Delivery Information     Scheduled delivery date: 09/06/19    Expected start date:  As soon as completes Jakafi taper (last dose Jakafi 09/08/19)    Medication will be delivered via Next Day Courier to the prescription address in Epic  [facility].  This shipment will not require a signature.      Explained the services we provide at [facility] and that each month we would call to set up refills.  Stressed importance of returning phone calls so that we could ensure they receive their medications in time each month.  Informed patient that we should be setting up refills 7-10 days prior to when they will run out of medication.  A pharmacist will reach out to perform a clinical assessment periodically.  Informed patient that a welcome packet and a drug information handout will be sent.      Patient verbalized understanding of the above information as well as how to contact the pharmacy at [phone] option 4 with any questions/concerns.  The pharmacy is open Monday through Friday 8:30am-4:30pm.  A pharmacist is available 24/7 via pager to answer any clinical questions they may have.    Patient Specific Needs     ? Does the patient have any physical, cognitive, or cultural barriers? No    ? Patient prefers to have medications discussed with  Patient     ? Is the patient or caregiver able to read and understand education materials at a high school level or above? Yes    ? Patient's primary language is  English     ? Is the patient high risk? Yes, patient is taking oral chemotherapy. Appropriateness of therapy has been assessed.     ? Does the patient require a Care Management Plan? No ? Does the patient require physician intervention or other additional services (i.e. nutrition, smoking cessation, social work)? No      Catalyna Reilly A Sh[facility] Specialty Pharmacist

## 2019-09-06 ENCOUNTER — Encounter: Payer: Self-pay | Admitting: Hematology and Oncology

## 2019-09-06 DIAGNOSIS — D471 Chronic myeloproliferative disease: Principal | ICD-10-CM

## 2019-09-06 NOTE — Unmapped (Signed)
Howard Villarreal is to start fedratinib on 09/19/19 (Last dose of Ruxolitinib on 09/08/19).   [name] asked if he could receive Shingrix vaccine as well as COVID-19 vaccine once available.  Both are inactive vaccines and are not contraindicated while taking fedratinib.  He may receive either/both vaccinations.    [name], PharmD  [facility]

## 2019-09-08 ENCOUNTER — Encounter: Payer: Self-pay | Admitting: Hematology and Oncology

## 2019-09-11 NOTE — Progress Notes (Signed)
[facility]  [address] [place], [place] [address] Phone: [phone]  Fax: [phone]   Clinic Day:  09/13/2019  Referring physician: [name], MD  Chief Complaint: Ruben [name] is a 73 y.o. 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  HPI: The patient was last seen in the hematology clinic on 08/25/2019 via telemedicine. At that time,  he was doing well.  He was tapering Jakafi with plans to start fedratinib.  Foot drop was improving. Labs included a hematocrit 37.2, hemoglobin 11.2. MCV 85.0, WBC 43,800 and ANC 28,900. Ferritin was 22. He continued on oral iron 3 days/week (Monday, Wednesday, Friday).  Jakafi taper continued.  The patient was contacted by Penelope Galas, Pharm D at [facility] on 09/05/2019.  Last dose of Jakafi on 09/08/2019.  Fedratinib 400 mg daily (four 100 mg capsules) was to start on 09/19/2019.  He was told that he could take the Shingrix vaccine as well as the COVID-19 vaccine.   During the interim, the patient was doing well.  His wife notes he started fedratinib on 09/09/2019 before knowing his vitamin B1 test. She notes that Darrick Penna called to check on him to see how he was tolerating the medication. The wife is very unhappy with the [facility] system.  Since being on the medication, he has had no nausea or vomiting. He notes feeling fatigued all the time and feeling "different". She notes that he has been more disoriented. He reports an increase in flatulence and feeling itchy. He uses Gold Bond to relieve itching. She was directed by [facility] not to use ibuprofen.    He is having no issues with his Crohn's disease. He is eating well. His wife encourages to him to eat more.    Past Medical History:  Diagnosis Date  . Blood dyscrasia   . BPH (benign prostatic hyperplasia)   . Cancer ([place])    SKIN/ POLYCYTHEMIA VERA  . Chronic kidney disease    RENAL INSUFF (40%)  . Crohn disease ([place])   .  Crohn's disease ([place])   . Glaucoma   . Glaucoma   . Gout   . History of chicken pox   . Myelofibrosis ([place])   . Nosebleed   . RBBB   . Right bundle branch block     Past Surgical History:  Procedure Laterality Date  . CATARACT EXTRACTION W/PHACO Left 07/13/2018   Procedure: CATARACT EXTRACTION PHACO AND INTRAOCULAR LENS PLACEMENT (IOC);  Surgeon: [name], MD;  Location: [facility] ORS;  Service: Ophthalmology;  Laterality: Left;  [place] 00:39  CDE 5.14 Fluid pack lot # [id number]  . CATARACT EXTRACTION W/PHACO Right 08/03/2018   Procedure: CATARACT EXTRACTION PHACO AND INTRAOCULAR LENS PLACEMENT (IOC);  Surgeon: [name], MD;  Location: [facility] ORS;  Service: Ophthalmology;  Laterality: Right;  [place] 00:43.4 CDE 5.23 Fluid pack Lot # [id number]  . COLONOSCOPY WITH PROPOFOL N/A 03/27/2017   Procedure: COLONOSCOPY WITH PROPOFOL;  Surgeon: [name], MD;  Location: [facility] ENDOSCOPY;  Service: Endoscopy;  Laterality: N/A;  . MOHS SURGERY     EAR  . PROSTATE SURGERY  2008   Prostate Biopsy in [place] to elevated PSA.  per patient normal  . Skin Lesion Basal cell removed    . wart removal     from eyelid    Family History  Problem Relation Age of Onset  . Cancer Mother        Liver  . Cancer  Father        Prostate  . Cancer Sister        Hodgkins lymphoma  . Cancer Paternal Aunt        Breast    Social History:  reports that he quit smoking about 15 years ago. His smoking use included cigarettes. He has a 5.00 pack-year smoking history. He has never used smokeless tobacco. He reports current alcohol use of about 4.0 - 5.0 standard drinks of alcohol per week. He reports that he does not use drugs.  He reports that he does not use drugs. He previously worked for [employer]. He no longer volunteers for [employer]. The patient's wife isJudith([phone]).He lives in [place].The patient is accompanied by his wife today.  Allergies: No Known  Allergies  Current Medications: Current Outpatient Medications  Medication Sig Dispense Refill  . acetaminophen (TYLENOL) 500 MG tablet Take 500 mg by mouth daily as needed for moderate pain.    Marland Kitchen allopurinol (ZYLOPRIM) 300 MG tablet Take 0.5 tablets (150 mg total) by mouth daily. 30 tablet 1  . Cholecalciferol (VITAMIN D3) 1000 units CAPS Take 1,000 Units by mouth daily at 12 noon.     . Cyanocobalamin (RA VITAMIN B-12 TR) 1000 MCG TBCR Take 1,000 mcg by mouth daily at 12 noon.     . Fedratinib HCl (INREBIC) 100 MG CAPS Take 4 tablets by mouth daily.     . ferrous sulfate 325 (65 FE) MG tablet Take 325 mg by mouth every Monday, Wednesday, and Friday.     . finasteride (PROSCAR) 5 MG tablet Take 1 tablet (5 mg total) by mouth every evening. 90 tablet 3  . folic acid (FOLVITE) 1 MG tablet Take 1 mg by mouth daily at 12 noon.     . latanoprost (XALATAN) 0.005 % ophthalmic solution Place 1 drop into both eyes at bedtime.     . Multiple Vitamins-Minerals (CENTRUM SILVER ULTRA MENS) TABS Take 1 tablet by mouth daily at 12 noon.     . Omega-3 Fatty Acids (KP FISH OIL) 1200 MG CAPS Take 1,200 mg by mouth daily.     . sodium bicarbonate 650 MG tablet Take 650 mg by mouth 2 (two) times daily.    . timolol (TIMOPTIC) 0.5 % ophthalmic solution Place 1 drop into both eyes daily. In AM  5   No current facility-administered medications for this visit.    Review of Systems  Constitutional: Positive for malaise/fatigue (no energy). Negative for chills, diaphoresis, fever and weight loss (up 5 pounds).       Doing well.  HENT: Negative.  Negative for congestion, ear pain, hearing loss, nosebleeds, sinus pain and sore throat.   Eyes: Negative for blurred vision, double vision and photophobia.       S/p cataract surgery.  Uses reading glasses.  Respiratory: Negative.  Negative for cough, hemoptysis, sputum production and shortness of breath.   Cardiovascular: Negative.  Negative for chest pain,  palpitations, orthopnea, leg swelling and PND.  Gastrointestinal: Negative for abdominal pain, blood in stool, constipation, diarrhea, heartburn, melena, nausea and vomiting.       Crohn's (no symptoms). Eating well. Increased flatulence.  Genitourinary: Negative.  Negative for dysuria, frequency, hematuria and urgency.  Musculoskeletal: Negative.  Negative for back pain, falls, joint pain, myalgias and neck pain.  Skin: Positive for itching (secondary to side effects; using Gold Bond). Negative for rash.  Neurological: Negative for dizziness, tingling, tremors, sensory change, speech change, focal weakness (right foot  drop, minimal), weakness and headaches.  Endo/Heme/Allergies: Negative for environmental allergies. Bruises/bleeds easily.  Psychiatric/Behavioral: Positive for memory loss (mild). Negative for depression and substance abuse. The patient is not nervous/anxious and does not have insomnia.   All other systems reviewed and are negative.  Performance status (ECOG): 1  Vitals Blood pressure 133/77, pulse 84, temperature (!) 97.4 F (36.3 C), temperature source Tympanic, resp. rate 18, weight 174 lb 13.2 oz (79.3 kg), SpO2 100 %.   Physical Exam  Constitutional: He is oriented to person, place, and time. He appears well-developed and well-nourished. No distress.  HENT:  Head: Normocephalic and atraumatic.  Mouth/Throat: Oropharynx is clear and moist. No oropharyngeal exudate.  Gray hair. Mask.  Eyes: Pupils are equal, round, and reactive to light. Conjunctivae and EOM are normal. No scleral icterus.  Glasses.  Blue eyes.  Neck: No JVD present.  Cardiovascular: Normal rate, regular rhythm and normal heart sounds.  No murmur heard. Pulmonary/Chest: Effort normal and breath sounds normal. No respiratory distress. He has no wheezes. He has no rales. He exhibits no tenderness.  Abdominal: Soft. Bowel sounds are normal. He exhibits no distension and no mass. There is no abdominal  tenderness. There is no rebound and no guarding.  Musculoskeletal:        General: No tenderness or edema. Normal range of motion.     Cervical back: Normal range of motion and neck supple.  Lymphadenopathy:       Head (right side): No preauricular, no posterior auricular and no occipital adenopathy present.       Head (left side): No preauricular, no posterior auricular and no occipital adenopathy present.    He has no cervical adenopathy.    He has no axillary adenopathy.       Right: No inguinal and no supraclavicular adenopathy present.       Left: No inguinal and no supraclavicular adenopathy present.  Neurological: He is alert and oriented to person, place, and time.  Skin: Skin is warm and dry. No rash noted. He is not diaphoretic. No erythema. No pallor.  Psychiatric: He has a normal mood and affect. His behavior is normal. Judgment and thought content normal.  Nursing note and vitals reviewed.   Appointment on 09/13/2019  Component Date Value Ref Range Status  . WBC 09/13/2019 50.2* 4.0 - 10.5 K/uL Final   Comment: REPEATED TO VERIFY [facility]ER CENTER CRITICAL VALUE PROTOCOL   . RBC 09/13/2019 4.70  4.22 - 5.81 MIL/uL Final  . Hemoglobin 09/13/2019 10.9* 13.0 - 17.0 g/dL Final  . HCT 09/13/2019 36.6* 39.0 - 52.0 % Final  . MCV 09/13/2019 77.9* 80.0 - 100.0 fL Final  . MCH 09/13/2019 23.2* 26.0 - 34.0 pg Final  . MCHC 09/13/2019 29.8* 30.0 - 36.0 g/dL Final  . RDW 09/13/2019 20.6* 11.5 - 15.5 % Final  . Platelets 09/13/2019 228  150 - 400 K/uL Final  . nRBC 09/13/2019 1.7* 0.0 - 0.2 % Final   Performed at [facility], [address] [place], [place] [address]  . Neutrophils Relative % 09/13/2019 PENDING  % Incomplete  . Neutro Abs 09/13/2019 PENDING  1.7 - 7.7 K/uL Incomplete  . Band Neutrophils 09/13/2019 PENDING  % Incomplete  . Lymphocytes Relative 09/13/2019 PENDING  % Incomplete  . Lymphs Abs 09/13/2019 PENDING  0.7 - 4.0 K/uL Incomplete  . Monocytes  Relative 09/13/2019 PENDING  % Incomplete  . Monocytes Absolute 09/13/2019 PENDING  0.1 - 1.0 K/uL Incomplete  . Eosinophils Relative  09/13/2019 PENDING  % Incomplete  . Eosinophils Absolute 09/13/2019 PENDING  0.0 - 0.5 K/uL Incomplete  . Basophils Relative 09/13/2019 PENDING  % Incomplete  . Basophils Absolute 09/13/2019 PENDING  0.0 - 0.1 K/uL Incomplete  . WBC Morphology 09/13/2019 PENDING   Incomplete  . RBC Morphology 09/13/2019 PENDING   Incomplete  . Smear Review 09/13/2019 PENDING   Incomplete  . Other 09/13/2019 PENDING  % Incomplete  . nRBC 09/13/2019 PENDING  0 /100 WBC Incomplete  . Metamyelocytes Relative 09/13/2019 PENDING  % Incomplete  . Myelocytes 09/13/2019 PENDING  % Incomplete  . Promyelocytes Relative 09/13/2019 PENDING  % Incomplete  . Blasts 09/13/2019 PENDING  % Incomplete  . Immature Granulocytes 09/13/2019 PENDING  % Incomplete  . Abs Immature Granulocytes 09/13/2019 PENDING  0.00 - 0.07 K/uL Incomplete  . Sodium 09/13/2019 132* 135 - 145 mmol/L Final  . Potassium 09/13/2019 4.3  3.5 - 5.1 mmol/L Final  . Chloride 09/13/2019 102  98 - 111 mmol/L Final  . CO2 09/13/2019 22  22 - 32 mmol/L Final  . Glucose, Bld 09/13/2019 102* 70 - 99 mg/dL Final  . BUN 09/13/2019 34* 8 - 23 mg/dL Final  . Creatinine, Ser 09/13/2019 1.84* 0.61 - 1.24 mg/dL Final  . Calcium 09/13/2019 8.7* 8.9 - 10.3 mg/dL Final  . Total Protein 09/13/2019 7.9  6.5 - 8.1 g/dL Final  . Albumin 09/13/2019 3.4* 3.5 - 5.0 g/dL Final  . AST 09/13/2019 45* 15 - 41 U/L Final  . ALT 09/13/2019 16  0 - 44 U/L Final  . Alkaline Phosphatase 09/13/2019 98  38 - 126 U/L Final  . Total Bilirubin 09/13/2019 0.8  0.3 - 1.2 mg/dL Final  . GFR calc non Af Amer 09/13/2019 36* >60 mL/min Final  . GFR calc Af Amer 09/13/2019 42* >60 mL/min Final  . Anion gap 09/13/2019 8  5 - 15 Final   Performed at [facility], [address] [place], [place] [address]    Assessment:  AYYAN SITES is a 73  y.o. 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 He has had polycythemia dating back to 2013. Hematocrit was 62.1 with a hemoglobin of 19.8 on 03/28/2015. 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 Erythropoietin level was 1.1 (low). 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  He began a phlebotomyprogram on 03/28/2015 to maintain a hematocrit goal of <45.Last phlebotomywason 05/02/2015. He is on a baby aspirin.  CBC on 11/28/2015 revealed a hematocrit of 39.3, hemoglobin 11.9, platelets 463,000, WBC 32,000 with an ANC of 25,000. Differential included 71% segs, 4% lymphocytes and 17% monocytes. Peripheral smear revealed leukocytosis with predominantly mature neutrophils, increased monocytes and rare blasts (<1%).  Bone marrowon 12/14/2015 revealed a persistent myeloproliferative neoplasm with myelofibrosis and alterations compatible with myelodysplatic progression. Marrow was packed (95-100% cellularity) with pan myelosis, multi-lineage dyspoiesis, and no significant increase in blasts. There was moderate to focally marked reticulin fibrosis (grade 2-3/3). Storage iron was not identified. Flow cytometry revealed non-specific atypical myeloid findings with no increase in blasts. 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 Cytogenetics were normal (46, XY).  Bone marrowon 04/07/2016 at [facility] revealed a hypercellular marrow (>95%) with persistent involvement by myeloid neoplasm with 5% blasts. There was mild reticulin fibrosis. FISH t(9;22) results were normal. 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 Cytogenetics are pending.  Bone marrowon 08/09/2018 revealed a hypercellular bone marrow (>95%) with persistent involvement by  myeloid neoplasm with <1% blasts by manual touch preparation differential. There was marked reticulin fibrosis with focal  collagen deposition. Cytogenetics were normal (46, XY). - Myeloid mutation panel study is pending.  Bone marrowon 07/14/2019 revealed a hypercellular marrow (>95%) with persistent involvement by a myeloid neoplasm with extensive fibrosis and rare (less than 5%) CD34 +blast byIHC. Myeloid mutation panel revealed the patient's previously reported variants inIDH2, JAK2, KRAS, NRAS and SRSF2 were again identified. There are no new variants. Cytogenetics are pending.  He was briefly on hydroxyurea500 mg a day (12/05/2015 - 12/19/2015 and 06/25/2016 - 07/03/2016). Platelet count increased to 1.18 million and white count to 54,800 without increased blasts on 06/25/2016. CBC on 07/02/2016 revealed a platelets of 429,000 with a white count 30,600. He began allopurinolon 12/19/2015. He began Jakafi 20 mg BID on 05/28/2016. He was onJakafi25 mg BID beginning09/12/2016.  Shanon Brow was tapered to off on 09/08/2019.  He began fedratinib on 09/09/2019.  LDHis followed: 466 on 01/02/2016, 596 on 05/23/2016, 663 on 07/30/2016, 522 on 09/24/2016, 638 on 01/05/2017, 625 on 02/25/2017, 1144 on 06/10/2017, 720 on 06/17/2017, 728 on 06/24/2017, and 724 on 01/06/2018. Triglycerideswere 183 (<150) on 08/20/2016, 180 on 01/05/2017, 312 on 06/17/2017, 165 on 11/25/2017, 113 on 02/17/2018.  He has urinary retention. Renal ultrasoundon 07/28/2016 revealed bilateral hydronephrosis (right >left) and a large postvoid residual (2 liters) suggesting bladder dysfunction or outlet obstruction. He underwent temporary Foley catheter placement. He performs I/O self catheterizations. He is on Flomax and finasteride. PSAwas 2.02 on 07/09/2016.  He is followed by GI (Dr. Vira Agar) for a history of polypsand Crohn's disease.He had an unremarkable colonoscopy in 03/2017.AbdomenandpelvisCTon 04/03/2019 revealed mild wall thickening of portions of a loop of small bowel within the pelvis with intervening  areas of mildly dilated small bowel,consistent with active Crohn's disease. No evidence of an abscess or fistula. No other evidence of bowel inflammation.There was splenomegaly(16.7 x 7.7 x 15.6 cm), developed since12/11/2011.There was mild bladder wall thickeningandenlargement of the prostate. Chronic bladder outlet obstructionwas felt to bethe etiology of bladder wall thickening.  He has chronicrenal insufficiency(Cr 1.85; CrCl 36 ml/min). SPEP on 09/23/2016 was negative. Spot urine revealed 17.4% of 27.3 mg/dL.of a monoclonal protein. 24 hour urine on 10/16/2016 revealed no monoclonal protein. Renal function transiently decreased in 06/2017 after Bactrim. He is followed by nephrology and urology.  He has a history of epistaxis. Normal studies included: PT, PTT, von Willebrand panel, and platelet function assay (PFA).  He has right foot drop. 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 There was no non-contrast MRI evidence of brain metastasis. There was only mild for age nonspecific cerebral white signal changes. Lumbar spine MRIon 06/09/2019 showed diffusely abnormal marrow signal in the visible skeleton, as seen on the brain MRI. There was no pathologic fracture and no extraosseous extension of tumor on the lumbar spine or visible sacrum. In conjunction with the splenomegaly by CT in [place] infiltration byleukemia/lymphoma may be the most likely etiology. Differential considerations included multiple myeloma and metastatic disease unknown primary. The involvement of the pelvis should be amenable to bone marrow biopsy.There was superimposed mild for age lumbar spine degeneration with no spinal stenosis.There was mild degenerative L4 and L5 neural foraminal stenosis.  Symptomatically, he feels fatigued and "different" since initiation of fedratinib.  Creatinine is 1.84 (CrCl 39.8 ml/min).  Plan: 1.   Labs today: CBC with diff,  CMP. 2. Myeloproliferative neoplasm /Polycythemia rubra vera Clinically, he is doing fatr since initiation of fedratinib on 09/09/2019.  Hematocrit 37.4.  Hemoglobin 11.2.  Platelets 194,000.  WBC 43,800 ([place] 28,900) on 08/24/2019.  Hematocrit36.6. Hemoglobin10.9. Platelets 228,000. WBC50,200(ANC34,600) on 09/13/2019.              Bone marrow on 07/14/2019 revealeda hypercellular marrow (>95%) with persistent involvement by a myeloid neoplasm with extensive fibrosis and <5% blasts.  Myeloid mutation panel revealed the patient's previously reported variants.  Review potential side effects associated with fedratinib.  Phone follow-up with [facility] [phone]; [phone]).  Follow-up B1 (thiamine) level. 3. Iron deficiency Hematocrit36.6. Hemoglobin 10.9. MCV77.9. Ferritinwas22 on 08/24/2019. Continue oral iron 3 days/week (Mondays, Wednesdays and Fridays). Continue to monitor. 4.Crohn's disease Disease is quiescent. Continue to monitor. 5.RTC in 1 week for MD assessment and labs (CBC with diff, CMP).    Addendum: B1 level was 132.4 (66.5 - 200) today.  I discussed the assessment and treatment plan with the patient.  The patient was provided an opportunity to ask questions and all were answered.  The patient agreed with the plan and demonstrated an understanding of the instructions.  The patient was advised to call back if the symptoms worsen or if the condition fails to improve as anticipated.  I provided 22 minutes of face-to-face time during this this encounter and > 50% was spent counseling as documented under my assessment and plan.    Lequita Asal, MD, PhD    09/13/2019, 9:48 AM  I, Selena Batten, am acting as scribe for [employer]. Mike Gip, MD, PhD.  I, Keilyn Haggard C. Mike Gip, MD, have reviewed the above  documentation for accuracy and completeness, and I agree with the above.

## 2019-09-12 ENCOUNTER — Institutional Professional Consult (permissible substitution): Admit: 2019-09-12 | Discharge: 2019-09-13 | Payer: MEDICARE | Attending: Oncology | Primary: Oncology

## 2019-09-12 NOTE — Unmapped (Signed)
Hi,     [name] wife of the patient contacted the Communication Center regarding the following:    - State they never received phone appointment call for today at 12:00    Please contact Akil at [phone].    Thanks in advance,    Drema Balzarine  [facility]   [phone]

## 2019-09-12 NOTE — Progress Notes (Signed)
Confirmed Name and DOB. States he started fedratinib on Friday, 09/09/2019. Denies any concerns.

## 2019-09-13 ENCOUNTER — Inpatient Hospital Stay (HOSPITAL_BASED_OUTPATIENT_CLINIC_OR_DEPARTMENT_OTHER): Payer: Medicare Other | Admitting: Hematology and Oncology

## 2019-09-13 ENCOUNTER — Other Ambulatory Visit: Payer: Self-pay

## 2019-09-13 ENCOUNTER — Inpatient Hospital Stay: Payer: Medicare Other

## 2019-09-13 VITALS — BP 133/77 | HR 84 | Temp 97.4°F | Resp 18 | Wt 174.8 lb

## 2019-09-13 DIAGNOSIS — D500 Iron deficiency anemia secondary to blood loss (chronic): Secondary | ICD-10-CM

## 2019-09-13 DIAGNOSIS — D471 Chronic myeloproliferative disease: Secondary | ICD-10-CM | POA: Diagnosis not present

## 2019-09-13 DIAGNOSIS — D45 Polycythemia vera: Secondary | ICD-10-CM

## 2019-09-13 LAB — COMPREHENSIVE METABOLIC PANEL
ALT: 16 U/L (ref 0–44)
AST: 45 U/L — ABNORMAL HIGH (ref 15–41)
Albumin: 3.4 g/dL — ABNORMAL LOW (ref 3.5–5.0)
Alkaline Phosphatase: 98 U/L (ref 38–126)
Anion gap: 8 (ref 5–15)
BUN: 34 mg/dL — ABNORMAL HIGH (ref 8–23)
CO2: 22 mmol/L (ref 22–32)
Calcium: 8.7 mg/dL — ABNORMAL LOW (ref 8.9–10.3)
Chloride: 102 mmol/L (ref 98–111)
Creatinine, Ser: 1.84 mg/dL — ABNORMAL HIGH (ref 0.61–1.24)
GFR calc Af Amer: 42 mL/min — ABNORMAL LOW (ref >60)
GFR calc non Af Amer: 36 mL/min — ABNORMAL LOW (ref >60)
Glucose, Bld: 102 mg/dL — ABNORMAL HIGH (ref 70–99)
Potassium: 4.3 mmol/L (ref 3.5–5.1)
Sodium: 132 mmol/L — ABNORMAL LOW (ref 135–145)
Total Bilirubin: 0.8 mg/dL (ref 0.3–1.2)
Total Protein: 7.9 g/dL (ref 6.5–8.1)

## 2019-09-13 LAB — CBC WITH DIFFERENTIAL/PLATELET
Abs Immature Granulocytes: 8 10*3/uL — ABNORMAL HIGH (ref 0.00–0.07)
Band Neutrophils: 16 %
Basophils Absolute: 1 10*3/uL — ABNORMAL HIGH (ref 0.0–0.1)
Basophils Relative: 2 %
Blasts: 3 %
Eosinophils Absolute: 0 10*3/uL (ref 0.0–0.5)
Eosinophils Relative: 0 %
HCT: 36.6 % — ABNORMAL LOW (ref 39.0–52.0)
Hemoglobin: 10.9 g/dL — ABNORMAL LOW (ref 13.0–17.0)
Lymphocytes Relative: 4 %
Lymphs Abs: 2 10*3/uL (ref 0.7–4.0)
MCH: 23.2 pg — ABNORMAL LOW (ref 26.0–34.0)
MCHC: 29.8 g/dL — ABNORMAL LOW (ref 30.0–36.0)
MCV: 77.9 fL — ABNORMAL LOW (ref 80.0–100.0)
Metamyelocytes Relative: 4 %
Monocytes Absolute: 3 10*3/uL — ABNORMAL HIGH (ref 0.1–1.0)
Monocytes Relative: 6 %
Myelocytes: 10 %
Neutro Abs: 34.6 10*3/uL — ABNORMAL HIGH (ref 1.7–7.7)
Neutrophils Relative %: 53 %
Platelets: 228 10*3/uL (ref 150–400)
Promyelocytes Relative: 2 %
RBC: 4.7 MIL/uL (ref 4.22–5.81)
RDW: 20.6 % — ABNORMAL HIGH (ref 11.5–15.5)
Smear Review: ADEQUATE
WBC: 50.2 10*3/uL (ref 4.0–10.5)
nRBC: 1.7 % — ABNORMAL HIGH (ref 0.0–0.2)

## 2019-09-15 ENCOUNTER — Telehealth: Payer: Self-pay

## 2019-09-15 NOTE — Unmapped (Signed)
Clinical Pharmacist Practitioner: [facility]     Mr.Epperly is a 73 y.o. 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    Current fedratinib dose: 400 mg once daily (started 09/09/19)    Plan:  -continue fedratinib 400 mg once daily    Counseled patient on:  -fedratinib therapeutic rationale, adherence/missed doses, cost of medications/cost implications, dose and administration, lab monitoring/follow-up, adverse effects and management, drug-drug interaction or drug-food interaction  -Continue to monitor energy level, if worse, to call MD    Prescriptions:  -fedratinib 100 mg #120 (30 days supply, 0 refills)  -Prescriptions sent electronically to patient's preferred pharmacy [facility]    Mr.Tartaglia and wife verbalized understanding of the education provided and had no further questions.       ___________________________________________________________________    Interim patient history  Patient reported following  -feeling more lethargic since starting fedratinib  -no nausea  -itch on scalp, back, some skin spots on forearms (pt's wife to send me pictures      Approximate face time spent with patient: 15 minutes    Audie Box, PharmD, BCOP, CPP  Clinical Pharmacist Practitioner, [facility]    The patient was physically located in [place] or a state in which I am permitted to provide care. The patient understood that s/he may incur co-pays and cost sharing, and agreed to the telemedicine visit. The visit was completed via phone and/or video, which was appropriate and reasonable under the circumstances given the patient's presentation at the time.     The patient has been advised of the potential risks and limitations of this mode of treatment (including, but not limited to, the absence of in-person examination) and has agreed to be treated using telemedicine. The patient's/patient's family's questions regarding telemedicine have been answered.      If the phone/video visit was completed in an ambulatory setting, the patient has also been advised to contact their provider???s office for worsening conditions, and seek emergency medical treatment and/or call 911 if the patient deems either necessary.     Visit conducted by: Telephone  Person contacted: Eliezer Lofts  Contact phone number: [phone]  Is there someone else in the room? patient's wife

## 2019-09-15 NOTE — Telephone Encounter (Signed)
Spoke with Ms Ruben [name] which she had some concerns about her husband. She reports the patient is very lethargic and is eating less, with a sore throat and coughing. I have informed her if the patient is more lethargic than he was when he was in the office this week he need to go to the ED immediately. She report she will watch him closely for about the next 1-2 hours and if any changes she will call 911. I have informed her she need to call 911 now, if the patient has had any changes and don't wait because anything can happen in 1-2 hours. I informed her again to please call 911 if the patient has had any changes. Ruben [name] has expressed understanding and was agreeable.

## 2019-09-17 LAB — VITAMIN B1: Vitamin B1 (Thiamine): 132.4 nmol/L (ref 66.5–200.0)

## 2019-09-19 NOTE — Unmapped (Signed)
I called to follow up on an incoming call from 12/24.       Howard Villarreal says he is much better today and did not have any nausea or vomiting. He did have soft bowel movements and was tired and run down.     His wife called Dr. Danton Sewer office and was told he was supposed to begin fedratinib on 12/28 and not 12/18 as he did.    I will let Dr Anise Salvo know and call Howard Villarreal back if she has any further thoughts.

## 2019-09-19 NOTE — Progress Notes (Signed)
[facility]  [address], [address] [place], [place] [address] Phone: [phone]  Fax: [phone]   Clinic Day:  09/20/2019  Referring physician: [name], MD  Chief Complaint: Ruben [name] is a 73 y.o. 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    HPI: The patient was last seen in the hematology clinic on 09/13/2019. At that time, he felt fatigued and "different" since initiation of fedratinib on 09/09/2019.  Hematocrit 36.6, hemoglobin 10.9, MCV 77.9, platelets 228,000, WBC 50,200 (ANC 34,600). Sodium was 132. BUN 34, creatinine 1.84, calcium 8.7, albumin 3.4, AST 45. Vitamin B1 was 132.4. He continued Jakafi taper.   Patient's wife reported to the clinic on 09/15/2019 that he appeared very lethargic and was eating less. He had a sore throat associated with a cough. [name], CMA strongly advised [name] to call 911 and go to the ED.   During the interim, he has felt good. He is tolerating fedratinib much better now. His energy is improving. His wife notes that he is more active. He is reading, completing chores around the house, and they are walking occasionally. He continues to have increased flatulence. He denies any abdominal pain. I encouraged him to call the clinic if he experiences any abdominal pain.   He continues to have itching secondary to side effects of the new drug. Gold bond helps relieve the itching. He reports hitting his right lower leg and it is red. His wife notes he hit his leg a few weeks ago. She noted that the area was very red and it looked like a few layers of skin was knocked off. She gave him some antibiotic cream.   Labs today show sodium 129 (low). He is drinking Boost, gatorade, water, and occasionally drinking red wine (2-3 glasses).  I advised the patient to drink more fluids with electrolytes including gatorade. His wife notes that he eats small meals. He is eating snacks in  between meals. He continues to take oral iron 3 days/week. I recommended he take oral iron 4 days/week secondary to lab results.    Past Medical History:  Diagnosis Date  . Blood dyscrasia   . BPH (benign prostatic hyperplasia)   . Cancer ([place])    SKIN/ POLYCYTHEMIA VERA  . Chronic kidney disease    RENAL INSUFF (40%)  . Crohn disease ([place])   . Crohn's disease ([place])   . Glaucoma   . Glaucoma   . Gout   . History of chicken pox   . Myelofibrosis ([place])   . Nosebleed   . RBBB   . Right bundle branch block     Past Surgical History:  Procedure Laterality Date  . CATARACT EXTRACTION W/PHACO Left 07/13/2018   Procedure: CATARACT EXTRACTION PHACO AND INTRAOCULAR LENS PLACEMENT (IOC);  Surgeon: [name], MD;  Location: [facility] ORS;  Service: Ophthalmology;  Laterality: Left;  [place] 00:39  CDE 5.14 Fluid pack lot # [id number]  . CATARACT EXTRACTION W/PHACO Right 08/03/2018   Procedure: CATARACT EXTRACTION PHACO AND INTRAOCULAR LENS PLACEMENT (IOC);  Surgeon: [name], MD;  Location: [facility] ORS;  Service: Ophthalmology;  Laterality: Right;  [place] 00:43.4 CDE 5.23 Fluid pack Lot # [id number]  . COLONOSCOPY WITH PROPOFOL N/A 03/27/2017   Procedure: COLONOSCOPY WITH PROPOFOL;  Surgeon: [name], MD;  Location: [facility] ENDOSCOPY;  Service: Endoscopy;  Laterality: N/A;  . MOHS SURGERY     EAR  . PROSTATE SURGERY  2008  Prostate Biopsy in [place] to elevated PSA.  per patient normal  . Skin Lesion Basal cell removed    . wart removal     from eyelid    Family History  Problem Relation Age of Onset  . Cancer Mother        Liver  . Cancer Father        Prostate  . Cancer Sister        Hodgkins lymphoma  . Cancer Paternal Aunt        Breast    Social History:  reports that he quit smoking about 15 years ago. His smoking use included cigarettes. He has a 5.00 pack-year smoking history. He has never used smokeless tobacco. He reports current alcohol use of about 4.0 -  5.0 standard drinks of alcohol per week. He reports that he does not use drugs. He previously worked for [employer]. He no longer volunteers for [employer]. The patient's wife [phone]).He lives in [place]. The patient is accompanied by his wife via face time today.  Allergies: No Known Allergies  Current Medications: Current Outpatient Medications  Medication Sig Dispense Refill  . acetaminophen (TYLENOL) 500 MG tablet Take 500 mg by mouth daily as needed for moderate pain.    Marland Kitchen allopurinol (ZYLOPRIM) 300 MG tablet Take 0.5 tablets (150 mg total) by mouth daily. 30 tablet 1  . Cholecalciferol (VITAMIN D3) 1000 units CAPS Take 1,000 Units by mouth daily at 12 noon.     . Cyanocobalamin (RA VITAMIN B-12 TR) 1000 MCG TBCR Take 1,000 mcg by mouth daily at 12 noon.     . Fedratinib HCl (INREBIC) 100 MG CAPS Take 4 tablets by mouth daily.     . ferrous sulfate 325 (65 FE) MG tablet Take 325 mg by mouth every Monday, Wednesday, and Friday.     . finasteride (PROSCAR) 5 MG tablet Take 1 tablet (5 mg total) by mouth every evening. 90 tablet 3  . folic acid (FOLVITE) 1 MG tablet Take 1 mg by mouth daily at 12 noon.     . latanoprost (XALATAN) 0.005 % ophthalmic solution Place 1 drop into both eyes at bedtime.     . Multiple Vitamins-Minerals (CENTRUM SILVER ULTRA MENS) TABS Take 1 tablet by mouth daily at 12 noon.     . Omega-3 Fatty Acids (KP FISH OIL) 1200 MG CAPS Take 1,200 mg by mouth daily.     . sodium bicarbonate 650 MG tablet Take 650 mg by mouth 2 (two) times daily.    . timolol (TIMOPTIC) 0.5 % ophthalmic solution Place 1 drop into both eyes daily. In AM  5   No current facility-administered medications for this visit.    Review of Systems  Constitutional: Positive for malaise/fatigue (energy improving) and weight loss (2 pounds). Negative for chills, diaphoresis and fever.       Feels good. Active.  HENT: Negative.  Negative for congestion, hearing loss,  nosebleeds and sore throat.   Eyes: Negative for blurred vision and double vision.       S/p cataract surgery. Uses reading glasses.  Respiratory: Negative.  Negative for cough, hemoptysis, sputum production, shortness of breath and wheezing.   Cardiovascular: Negative.  Negative for chest pain, palpitations, claudication and leg swelling.  Gastrointestinal: Negative for abdominal pain, blood in stool, constipation, diarrhea, heartburn, melena, nausea and vomiting.       Crohn's (no symptoms). Eating well. Small portions. Snacking between meals. Increased flatulence.  Genitourinary: Negative.  Negative  for dysuria, frequency, hematuria and urgency.  Musculoskeletal: Negative.  Negative for back pain, joint pain, myalgias and neck pain.  Skin: Positive for itching (secondary to side effects; using gold bond). Negative for rash.       Red spot on right lower leg; used antibiotic cream.  Neurological: Negative for dizziness, tingling, sensory change, focal weakness, weakness and headaches.       Right foot drop, minimal.  Endo/Heme/Allergies: Bruises/bleeds easily.  Psychiatric/Behavioral: Positive for memory loss (mild). Negative for depression. The patient is not nervous/anxious and does not have insomnia.   All other systems reviewed and are negative.  Performance status (ECOG):  1  Vitals Blood pressure 126/76, pulse 97, temperature (!) 97.4 F (36.3 C), temperature source Tympanic, resp. rate 18, weight 172 lb 13.5 oz (78.4 kg), SpO2 100 %.   Physical Exam  Constitutional: He is oriented to person, place, and time. He appears well-developed and well-nourished. No distress.  HENT:  Head: Normocephalic and atraumatic.  Mouth/Throat: Oropharynx is clear and moist. No oropharyngeal exudate.  Short gray hair.  Eyes: Pupils are equal, round, and reactive to light. Conjunctivae and EOM are normal. No scleral icterus.  Glasses. Blue eyes.  Cardiovascular: Normal rate, regular rhythm and  normal heart sounds.  No murmur heard. Pulmonary/Chest: Effort normal and breath sounds normal. No respiratory distress. He has no wheezes. He has no rales. He exhibits no tenderness.  Abdominal: Soft. Bowel sounds are normal. He exhibits no distension and no mass. There is no abdominal tenderness. There is no rebound and no guarding.  Musculoskeletal:        General: No tenderness or edema. Normal range of motion.     Cervical back: Normal range of motion and neck supple.  Lymphadenopathy:       Head (right side): No preauricular, no posterior auricular and no occipital adenopathy present.       Head (left side): No preauricular, no posterior auricular and no occipital adenopathy present.    He has no cervical adenopathy.    He has no axillary adenopathy.       Right: No inguinal and no supraclavicular adenopathy present.       Left: No inguinal and no supraclavicular adenopathy present.  Neurological: He is alert and oriented to person, place, and time.  Skin: Skin is warm and dry. He is not diaphoretic.  Psychiatric: He has a normal mood and affect. His behavior is normal. Judgment and thought content normal.  Nursing note and vitals reviewed.   Appointment on 09/20/2019  Component Date Value Ref Range Status  . Sodium 09/20/2019 129* 135 - 145 mmol/L Final  . Potassium 09/20/2019 3.7  3.5 - 5.1 mmol/L Final  . Chloride 09/20/2019 102  98 - 111 mmol/L Final  . CO2 09/20/2019 19* 22 - 32 mmol/L Final  . Glucose, Bld 09/20/2019 128* 70 - 99 mg/dL Final  . BUN 09/20/2019 30* 8 - 23 mg/dL Final  . Creatinine, Ser 09/20/2019 1.82* 0.61 - 1.24 mg/dL Final  . Calcium 09/20/2019 8.4* 8.9 - 10.3 mg/dL Final  . Total Protein 09/20/2019 8.2* 6.5 - 8.1 g/dL Final  . Albumin 09/20/2019 3.4* 3.5 - 5.0 g/dL Final  . AST 09/20/2019 45* 15 - 41 U/L Final  . ALT 09/20/2019 15  0 - 44 U/L Final  . Alkaline Phosphatase 09/20/2019 101  38 - 126 U/L Final  . Total Bilirubin 09/20/2019 0.8  0.3 - 1.2  mg/dL Final  . GFR calc non Af Wyvonnia Lora  09/20/2019 36* >60 mL/min Final  . GFR calc Af Amer 09/20/2019 42* >60 mL/min Final  . Anion gap 09/20/2019 8  5 - 15 Final   Performed at [facility], [address], [place], [place] [address]  . WBC 09/20/2019 43.2* 4.0 - 10.5 K/uL Final  . RBC 09/20/2019 4.99  4.22 - 5.81 MIL/uL Final  . Hemoglobin 09/20/2019 11.0* 13.0 - 17.0 g/dL Final  . HCT 09/20/2019 37.4* 39.0 - 52.0 % Final  . MCV 09/20/2019 74.9* 80.0 - 100.0 fL Final  . MCH 09/20/2019 22.0* 26.0 - 34.0 pg Final  . MCHC 09/20/2019 29.4* 30.0 - 36.0 g/dL Final  . RDW 09/20/2019 22.4* 11.5 - 15.5 % Final  . Platelets 09/20/2019 310  150 - 400 K/uL Final  . nRBC 09/20/2019 1.5* 0.0 - 0.2 % Final  . Neutrophils Relative % 09/20/2019 51  % Final  . Neutro Abs 09/20/2019 29.8* 1.7 - 7.7 K/uL Final  . Band Neutrophils 09/20/2019 18  % Final  . Lymphocytes Relative 09/20/2019 10  % Final  . Lymphs Abs 09/20/2019 4.3* 0.7 - 4.0 K/uL Final  . Monocytes Relative 09/20/2019 8  % Final  . Monocytes Absolute 09/20/2019 3.5* 0.1 - 1.0 K/uL Final  . Eosinophils Relative 09/20/2019 0  % Final  . Eosinophils Absolute 09/20/2019 0.0  0.0 - 0.5 K/uL Final  . Basophils Relative 09/20/2019 1  % Final  . Basophils Absolute 09/20/2019 0.4* 0.0 - 0.1 K/uL Final  . WBC Morphology 09/20/2019 MILD LEFT SHIFT (1-5% METAS, OCC MYELO, OCC BANDS)   Final  . RBC Morphology 09/20/2019 NO SCHISTOCYTES SEEN   Final  . nRBC 09/20/2019 5* 0 /100 WBC Final  . Metamyelocytes Relative 09/20/2019 9  % Final  . Myelocytes 09/20/2019 3  % Final  . Abs Immature Granulocytes 09/20/2019 5.20* 0.00 - 0.07 K/uL Final  . Tear Drop Cells 09/20/2019 PRESENT   Final  . Ovalocytes 09/20/2019 PRESENT   Final  . Giant PLTs 09/20/2019 PRESENT   Final   Performed at [facility], [address], [place], [place] [address]    Assessment:  DJANGO NGUYEN is a 73 y.o. 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 He has had  polycythemia dating back to 2013. Hematocrit was 62.1 with a hemoglobin of 19.8 on 03/28/2015. 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 Erythropoietin level was 1.1 (low). 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  He began a phlebotomyprogram on 03/28/2015 to maintain a hematocrit goal of <45.Last phlebotomywason 05/02/2015. He is on a baby aspirin.  CBC on 11/28/2015 revealed a hematocrit of 39.3, hemoglobin 11.9, platelets 463,000, WBC 32,000 with an ANC of 25,000. Differential included 71% segs, 4% lymphocytes and 17% monocytes. Peripheral smear revealed leukocytosis with predominantly mature neutrophils, increased monocytes and rare blasts (<1%).  Bone marrowon 12/14/2015 revealed a persistent myeloproliferative neoplasm with myelofibrosis and alterations compatible with myelodysplatic progression. Marrow was packed (95-100% cellularity) with pan myelosis, multi-lineage dyspoiesis, and no significant increase in blasts. There was moderate to focally marked reticulin fibrosis (grade 2-3/3). Storage iron was not identified. Flow cytometry revealed non-specific atypical myeloid findings with no increase in blasts. 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 Cytogenetics were normal (46, XY).  Bone marrowon 04/07/2016 at [facility] revealed a hypercellular marrow (>95%) with persistent involvement by myeloid neoplasm with 5% blasts. There was mild reticulin fibrosis. FISH t(9;22) results were normal. Myeloid mutation panel revealed JAK2 V617F, IDH2, RUNX1,  and SRSF2 consistent with clonal evolution. Cytogenetics are pending.  Bone marrowon 08/09/2018 revealed a hypercellular bone marrow (>95%) with persistent involvement by myeloid neoplasm with <1% blasts by manual touch preparation differential. There was marked reticulin fibrosis with focal collagen deposition. Cytogenetics were normal (46,  XY). - Myeloid mutation panel study is pending.  Bone marrowon 07/14/2019 revealed a hypercellular marrow (>95%) with persistent involvement by a myeloid neoplasm with extensive fibrosis and rare (less than 5%) CD34 +blast byIHC. Myeloid mutation panel revealed the patient's previously reported variants inIDH2, JAK2, KRAS, NRAS and SRSF2 were again identified. There are no new variants. Cytogenetics are pending.  He was briefly on hydroxyurea500 mg a day (12/05/2015 - 12/19/2015 and 06/25/2016 - 07/03/2016). Platelet count increased to 1.18 million and white count to 54,800 without increased blasts on 06/25/2016. CBC on 07/02/2016 revealed a platelets of 429,000 with a white count 30,600. He began allopurinolon 12/19/2015. He began Jakafi 20 mg BID on 05/28/2016. He has been onJakafi25 mg BID since09/12/2016.  Jakafi was tapered to off.  He began fedratinib on 09/09/2019.  LDHis followed: 466 on 01/02/2016, 596 on 05/23/2016, 663 on 07/30/2016, 522 on 09/24/2016, 638 on 01/05/2017, 625 on 02/25/2017, 1144 on 06/10/2017, 720 on 06/17/2017, 728 on 06/24/2017, and 724 on 01/06/2018. Triglycerideswere 183 (<150) on 08/20/2016, 180 on 01/05/2017, 312 on 06/17/2017, 165 on 11/25/2017, 113 on 02/17/2018.  He has urinary retention. Renal ultrasoundon 07/28/2016 revealed bilateral hydronephrosis (right >left) and a large postvoid residual (2 liters) suggesting bladder dysfunction or outlet obstruction. He underwent temporary Foley catheter placement. He performs I/O self catheterizations. He is on Flomax and finasteride. PSAwas 2.02 on 07/09/2016.  He is followed by GI (Dr. Vira Agar) for a history of polypsand Crohn's disease.He had an unremarkable colonoscopy in 03/2017.AbdomenandpelvisCTon 04/03/2019 revealed mild wall thickening of portions of a loop of small bowel within the pelvis with intervening areas of mildly dilated small bowel,consistent with active Crohn's  disease. No evidence of an abscess or fistula. No other evidence of bowel inflammation.There was splenomegaly(16.7 x 7.7 x 15.6 cm), developed since12/11/2011.There was mild bladder wall thickeningandenlargement of the prostate. Chronic bladder outlet obstructionwas felt to bethe etiology of bladder wall thickening.  He has chronicrenal insufficiency(Cr 1.85; CrCl 36 ml/min). SPEP on 09/23/2016 was negative. Spot urine revealed 17.4% of 27.3 mg/dL.of a monoclonal protein. 24 hour urine on 10/16/2016 revealed no monoclonal protein. Renal function transiently decreased in 06/2017 after Bactrim. He is followed by nephrology and urology.  He has a history of epistaxis. Normal studies included: PT, PTT, von Willebrand panel, and platelet function assay (PFA).  He has right foot drop. 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 There was no non-contrast MRI evidence of brain metastasis. There was only mild for age nonspecific cerebral white signal changes. Lumbar spine MRIon 06/09/2019 showed diffusely abnormal marrow signal in the visible skeleton, as seen on the brain MRI. There was no pathologic fracture and no extraosseous extension of tumor on the lumbar spine or visible sacrum. In conjunction with the splenomegaly by CT in [place] infiltration byleukemia/lymphoma may be the most likely etiology. Differential considerations included multiple myeloma and metastatic disease unknown primary. The involvement of the pelvis should be amenable to bone marrow biopsy.There was superimposed mild for age lumbar spine degeneration with no spinal stenosis.There was mild degenerative L4 and L5 neural foraminal stenosis.  Symptomatically, he is feeling better.  He denies any B symptoms.    Plan: 1.   Labs today: CBC with diff, CMP.  2. Myeloproliferative neoplasm /Polycythemia rubra vera  Clinically,he is doing well on  fedratinib. Hematocrit33.7. Hemoglobin10.4. Platelets 185,000. WBC40,800(ANC22,700) on 07/05/2019.  Hematocrit 35.0. Hemoglobin 10.8. Platelets 253,000. WBC 58,600 ([place] 50,400) on 07/14/2019. Hematocrit 37.4. Hemoglobin 11.2. Platelets 194,000. WBC 43,800 ([place] 28,900) on 08/24/2019.  Hematocrit 36.6. Hemoglobin 10.9. Platelets 228,000. WBC 50,200 ([place] 34,600) on 09/13/2019.  Hematocrit 37.4. Hemoglobin 11.0. Platelets 310,000. WBC 43,200 ([place] 29,800) on 09/19/2019. Bone marrow on 07/14/2019 revealeda hypercellular marrow (>95%) with persistent involvement by a myeloid neoplasm with extensive fibrosis and <5% blasts.  Myeloid mutation panel revealed the patient's previously reported variants.  Cytogenetics arecurrently not available. He began fedratinib on 09/09/2019.   WBC is coming down.    He is feeling much better since drug initiation.   He has new hyponatremia (incidence 26%). 3. Iron deficiency Hematocrit37.4. Hemoglobin 11.0. MCV74.9.  Ferritinwas22 on 08/24/2019. Patient denies any bleeding.  Continueoral iron 3 days/week (Mondays, Wednesdays and Fridays).   Increase by 1 tablet per week. Continue to monitor. 4.Crohn's disease Diseaseremains quiescent. Continue to monitor. 5.Right foot drop, improving Etiologyunclear. He is followed byDr. Manuella Ghazi, neurologist. Continue to monitor. 6.Hyponatremia  Sodium 129  Patient is euvolemic.  Discuss fluids with electrolytes and decreased free water intake.  Contact [facility] given high incidence associated with fedratinib. 7.   RTC in 2 weeks for MD assessment and labs (CBC with diff, CMP).  I discussed the assessment and treatment plan with the patient.  The patient was provided an  opportunity to ask questions and all were answered.  The patient agreed with the plan and demonstrated an understanding of the instructions.  The patient was advised to call back if the symptoms worsen or if the condition fails to improve as anticipated.   Lequita Asal, MD, PhD    09/20/2019, 9:22 AM  I, Selena Batten, am acting as scribe for [employer]. Mike Gip, MD, PhD.  I, Yago Ludvigsen C. Mike Gip, MD, have reviewed the above documentation for accuracy and completeness, and I agree with the above.

## 2019-09-20 ENCOUNTER — Other Ambulatory Visit: Payer: Self-pay

## 2019-09-20 ENCOUNTER — Encounter: Payer: Self-pay | Admitting: Hematology and Oncology

## 2019-09-20 ENCOUNTER — Inpatient Hospital Stay: Payer: Medicare Other

## 2019-09-20 ENCOUNTER — Inpatient Hospital Stay (HOSPITAL_BASED_OUTPATIENT_CLINIC_OR_DEPARTMENT_OTHER): Payer: Medicare Other | Admitting: Hematology and Oncology

## 2019-09-20 VITALS — BP 126/76 | HR 97 | Temp 97.4°F | Resp 18 | Wt 172.8 lb

## 2019-09-20 DIAGNOSIS — D500 Iron deficiency anemia secondary to blood loss (chronic): Secondary | ICD-10-CM | POA: Diagnosis not present

## 2019-09-20 DIAGNOSIS — N2581 Secondary hyperparathyroidism of renal origin: Secondary | ICD-10-CM | POA: Insufficient documentation

## 2019-09-20 DIAGNOSIS — D471 Chronic myeloproliferative disease: Secondary | ICD-10-CM

## 2019-09-20 DIAGNOSIS — D45 Polycythemia vera: Secondary | ICD-10-CM | POA: Diagnosis not present

## 2019-09-20 DIAGNOSIS — E790 Hyperuricemia without signs of inflammatory arthritis and tophaceous disease: Secondary | ICD-10-CM | POA: Diagnosis not present

## 2019-09-20 DIAGNOSIS — E871 Hypo-osmolality and hyponatremia: Secondary | ICD-10-CM | POA: Diagnosis not present

## 2019-09-20 DIAGNOSIS — N183 Chronic kidney disease, stage 3 unspecified: Secondary | ICD-10-CM | POA: Insufficient documentation

## 2019-09-20 LAB — COMPREHENSIVE METABOLIC PANEL
ALT: 15 U/L (ref 0–44)
AST: 45 U/L — ABNORMAL HIGH (ref 15–41)
Albumin: 3.4 g/dL — ABNORMAL LOW (ref 3.5–5.0)
Alkaline Phosphatase: 101 U/L (ref 38–126)
Anion gap: 8 (ref 5–15)
BUN: 30 mg/dL — ABNORMAL HIGH (ref 8–23)
CO2: 19 mmol/L — ABNORMAL LOW (ref 22–32)
Calcium: 8.4 mg/dL — ABNORMAL LOW (ref 8.9–10.3)
Chloride: 102 mmol/L (ref 98–111)
Creatinine, Ser: 1.82 mg/dL — ABNORMAL HIGH (ref 0.61–1.24)
GFR calc Af Amer: 42 mL/min — ABNORMAL LOW (ref >60)
GFR calc non Af Amer: 36 mL/min — ABNORMAL LOW (ref >60)
Glucose, Bld: 128 mg/dL — ABNORMAL HIGH (ref 70–99)
Potassium: 3.7 mmol/L (ref 3.5–5.1)
Sodium: 129 mmol/L — ABNORMAL LOW (ref 135–145)
Total Bilirubin: 0.8 mg/dL (ref 0.3–1.2)
Total Protein: 8.2 g/dL — ABNORMAL HIGH (ref 6.5–8.1)

## 2019-09-20 LAB — CBC WITH DIFFERENTIAL/PLATELET
Abs Immature Granulocytes: 5.2 10*3/uL — ABNORMAL HIGH (ref 0.00–0.07)
Band Neutrophils: 18 %
Basophils Absolute: 0.4 10*3/uL — ABNORMAL HIGH (ref 0.0–0.1)
Basophils Relative: 1 %
Eosinophils Absolute: 0 10*3/uL (ref 0.0–0.5)
Eosinophils Relative: 0 %
HCT: 37.4 % — ABNORMAL LOW (ref 39.0–52.0)
Hemoglobin: 11 g/dL — ABNORMAL LOW (ref 13.0–17.0)
Lymphocytes Relative: 10 %
Lymphs Abs: 4.3 10*3/uL — ABNORMAL HIGH (ref 0.7–4.0)
MCH: 22 pg — ABNORMAL LOW (ref 26.0–34.0)
MCHC: 29.4 g/dL — ABNORMAL LOW (ref 30.0–36.0)
MCV: 74.9 fL — ABNORMAL LOW (ref 80.0–100.0)
Metamyelocytes Relative: 9 %
Monocytes Absolute: 3.5 10*3/uL — ABNORMAL HIGH (ref 0.1–1.0)
Monocytes Relative: 8 %
Myelocytes: 3 %
Neutro Abs: 29.8 10*3/uL — ABNORMAL HIGH (ref 1.7–7.7)
Neutrophils Relative %: 51 %
Platelets: 310 10*3/uL (ref 150–400)
RBC Morphology: NONE SEEN
RBC: 4.99 MIL/uL (ref 4.22–5.81)
RDW: 22.4 % — ABNORMAL HIGH (ref 11.5–15.5)
WBC: 43.2 10*3/uL — ABNORMAL HIGH (ref 4.0–10.5)
nRBC: 1.5 % — ABNORMAL HIGH (ref 0.0–0.2)
nRBC: 5 /100 WBC — ABNORMAL HIGH

## 2019-09-21 NOTE — Unmapped (Signed)
Hi Sheh-Li Chen and [name],    Dr. Merlene Pulling with [facility] has contacted the Communication Center requesting to speak with you directly regarding the following:    Requesting to speak with the team regarding this mutual patient    Dr. Merlene Pulling is available for a call back anytime.  The best number to call back is [phone]    A page has also been sent.    Thank you,  [name]  [facility] Center  [phone]

## 2019-09-26 ENCOUNTER — Encounter: Payer: Self-pay | Admitting: Hematology and Oncology

## 2019-09-26 ENCOUNTER — Telehealth: Payer: Self-pay

## 2019-09-26 NOTE — Telephone Encounter (Signed)
Spoke with the patient to inform him if he could check with the [facility] which will have a better ideal when they will start given vaccine to 9 and older. The patient has informed me that him and his wife will be able to get the vaccine by Wednesday of this week. The patient was understanding and agreeable.

## 2019-09-29 DIAGNOSIS — D45 Polycythemia vera: Principal | ICD-10-CM

## 2019-09-29 NOTE — Unmapped (Signed)
[facility] Clinical Assessment & Refill Coordination Note    Howard Villarreal, DOB: 07/31/1946  Phone: [phone] (home)     All above HIPAA information was verified with patient.     Was a Nurse, learning disability used for this call? No    Specialty Medication(s):   Hematology/Oncology: Inbrebic     Current Outpatient Medications   Medication Sig Dispense Refill   ??? acetaminophen (TYLENOL) 500 MG tablet Take 500 mg by mouth every eight (8) hours as needed.     ??? allopurinoL (ZYLOPRIM) 300 MG tablet Take 150 mg by mouth daily.      ??? cholecalciferol, vitamin D3, 1,000 unit capsule Take 1,000 Units by mouth daily with evening meal.     ??? cyanocobalamin 1000 MCG tablet Take 1,000 mcg by mouth daily.      ??? fedratinib (INREBIC) 100 mg cap Take 4 capsules (400 mg) by mouth daily. 120 capsule 0   ??? ferrous sulfate 325 (65 FE) MG EC tablet Take 1 tablet by mouth Three (3) times a week.     ??? finasteride (PROSCAR) 5 mg tablet Take 5 mg by mouth daily with evening meal.     ??? fish oil-dha-epa 1,200-144-216 mg cap Take 1 capsule by mouth daily with evening meal.      ??? folic acid (FOLVITE) 1 MG tablet Take 1 mg by mouth daily.      ??? latanoprost (XALATAN) 0.005 % ophthalmic solution 1 drop nightly.     ??? MULTIVIT-MIN/FA/LYCOPEN/LUTEIN (CENTRUM SILVER ULTRA MEN'S ORAL) Take 1 tablet by mouth daily.     ??? sodium bicarbonate 650 mg tablet Take 650 mg by mouth Two (2) times a day.     ??? timolol (BETIMOL) 0.5 % ophthalmic solution 1 drop Two (2) times a day.     ??? timolol (TIMOPTIC) 0.5 % ophthalmic solution Apply 1 drop to eye daily.       No current facility-administered medications for this visit.         Changes to medications: Macari reports no changes at this time.    No Known Allergies    Changes to allergies: No    SPECIALTY MEDICATION ADHERENCE     Inrebic 400 mg: 9 days of medicine on hand     Medication Adherence    Patient reported X missed doses in the last month: 0 Specialty Medication: inrebic 400 mg  Informant: patient  Confirmed plan for next specialty medication refill: delivery by pharmacy          Specialty medication(s) dose(s) confirmed: Regimen is correct and unchanged.     Are there any concerns with adherence? No    Adherence counseling provided? Not needed    CLINICAL MANAGEMENT AND INTERVENTION      Clinical Benefit Assessment:    Do you feel the medicine is effective or helping your condition? Yes    Clinical Benefit counseling provided? Not needed    Adverse Effects Assessment:    Are you experiencing any side effects? Yes, patient reports experiencing some diarrhea and fatigue/tired. Side effect counseling provided: diarrhea is manageable. Is aware he may use Imodium AD (OTC) if needed.     Are you experiencing difficulty administering your medicine? No    Quality of Life Assessment:    How many days over the past month did your polycythemia vera keep you from your normal activities? For example, brushing your teeth or getting up in the morning. 0    Have you  discussed this with your provider? Not needed    Therapy Appropriateness:    Is therapy appropriate? Yes, therapy is appropriate and should be continued    DISEASE/MEDICATION-SPECIFIC INFORMATION      N/A    PATIENT SPECIFIC NEEDS     ? Does the patient have any physical, cognitive, or cultural barriers? No    ? Is the patient high risk? No     ? Does the patient require a Care Management Plan? No     ? Does the patient require physician intervention or other additional services (i.e. nutrition, smoking cessation, social work)? No      SHIPPING     Specialty Medication(s) to be Shipped:   Hematology/Oncology: Inbrebic    Other medication(s) to be shipped: none     Changes to insurance: No    Delivery Scheduled: Yes, Expected medication delivery date: 10/05/19.     Medication will be delivered via Next Day Courier to the confirmed prescription address in [facility]. The patient will receive a drug information handout for each medication shipped and additional FDA Medication Guides as required.  Verified that patient has previously received a Conservation officer, historic buildings.    All of the patient's questions and concerns have been addressed.    Breck Coons [facility] Specialty Pharmacist

## 2019-09-29 NOTE — Progress Notes (Signed)
[facility]  [address] [place], [place] [address] Phone: [phone]  Fax: [phone]   Clinic Day:  10/04/2019  Referring physician: [name], MD  Chief Complaint: Ruben [name] is a 74 y.o. 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  HPI: The patient was last seen in the hematology clinic on 09/20/2019. At that time, he denied any B symptoms. CBC revealed a hematocrit 37.4, hemoglobin 11.0, MCV 74.9, platelets 310,000, WBC 43,200 (ANC 29,800; monocyte count 3,500). Sodium was 129.  BUN was 30 with a creatinine 1.82.  He continued fedratinib.  Oral iron was increased to 4 days/week.   During the interim, he was "hanging in there". He notes being fatigued. He has a cough and occasional diarrhea and nausea. His wife notes that he has a dry cough; he gargles with salt water with a spoon full of honey.   He reports having a good appetite. He is eating well. He drinks Boost, water, OJ, and fluids that contain electrolytes. He remains on oral iron 4 days/week. I discussed increasing his oral iron 1 pill 5 days/week related to lab results. Patient agreed.   His foot drop has resolved. His wife notes his gait is normal. He notes that he can almost walk on both heels. He continues to be active around the house. The patient and his wife like to take daily walks.   He has a facial, chest, and back rash (minor). He noticed that rashes appeared with taking fedratinib. The itching associated with the fedratinib is mild.    Past Medical History:  Diagnosis Date  . Blood dyscrasia   . BPH (benign prostatic hyperplasia)   . Cancer ([place])    SKIN/ POLYCYTHEMIA VERA  . Chronic kidney disease    RENAL INSUFF (40%)  . Crohn disease ([place])   . Crohn's disease ([place])   . Glaucoma   . Glaucoma   . Gout   . History of chicken pox   . Myelofibrosis ([place])   . Nosebleed   . RBBB   . Right bundle branch block       Past Surgical History:  Procedure Laterality Date  . CATARACT EXTRACTION W/PHACO Left 07/13/2018   Procedure: CATARACT EXTRACTION PHACO AND INTRAOCULAR LENS PLACEMENT (IOC);  Surgeon: [name], MD;  Location: [facility] ORS;  Service: Ophthalmology;  Laterality: Left;  [place] 00:39  CDE 5.14 Fluid pack lot # [id number]  . CATARACT EXTRACTION W/PHACO Right 08/03/2018   Procedure: CATARACT EXTRACTION PHACO AND INTRAOCULAR LENS PLACEMENT (IOC);  Surgeon: [name], MD;  Location: [facility] ORS;  Service: Ophthalmology;  Laterality: Right;  [place] 00:43.4 CDE 5.23 Fluid pack Lot # [id number]  . COLONOSCOPY WITH PROPOFOL N/A 03/27/2017   Procedure: COLONOSCOPY WITH PROPOFOL;  Surgeon: [name], MD;  Location: [facility];  Service: Endoscopy;  Laterality: N/A;  . MOHS SURGERY     EAR  . PROSTATE SURGERY  2008   Prostate Biopsy in [place] to elevated PSA.  per patient normal  . Skin Lesion Basal cell removed    . wart removal     from eyelid    Family History  Problem Relation Age of Onset  . Cancer Mother        Liver  . Cancer Father        Prostate  . Cancer Sister        Hodgkins lymphoma  . Cancer Paternal Aunt  Breast    Social History:  reports that he quit smoking about 16 years ago. His smoking use included cigarettes. He has a 5.00 pack-year smoking history. He has never used smokeless tobacco. He reports current alcohol use of about 4.0 - 5.0 standard drinks of alcohol per week. He reports that he does not use drugs. He previously worked for [employer]. He no longer volunteers for [employer]. The patient's wife isJudith([phone]).He lives in [place]. The patient is accompanied by Ruben [name] via [place] today.  Allergies: No Known Allergies  Current Medications: Current Outpatient Medications  Medication Sig Dispense Refill  . acetaminophen (TYLENOL) 500 MG tablet Take 500 mg by mouth daily as needed for moderate pain.    Marland Kitchen allopurinol  (ZYLOPRIM) 300 MG tablet Take 0.5 tablets (150 mg total) by mouth daily. 30 tablet 1  . Cholecalciferol (VITAMIN D3) 1000 units CAPS Take 1,000 Units by mouth daily at 12 noon.     . Cyanocobalamin (RA VITAMIN B-12 TR) 1000 MCG TBCR Take 1,000 mcg by mouth daily at 12 noon.     . Fedratinib HCl (INREBIC) 100 MG CAPS Take 4 tablets by mouth daily.     . ferrous sulfate 325 (65 FE) MG tablet Take 325 mg by mouth 4 (four) times a week.     . finasteride (PROSCAR) 5 MG tablet Take 1 tablet (5 mg total) by mouth every evening. 90 tablet 3  . folic acid (FOLVITE) 1 MG tablet Take 1 mg by mouth daily at 12 noon.     . latanoprost (XALATAN) 0.005 % ophthalmic solution Place 1 drop into both eyes at bedtime.     . Multiple Vitamins-Minerals (CENTRUM SILVER ULTRA MENS) TABS Take 1 tablet by mouth daily at 12 noon.     . Omega-3 Fatty Acids (KP FISH OIL) 1200 MG CAPS Take 1,200 mg by mouth daily.     . sodium bicarbonate 650 MG tablet Take 650 mg by mouth 2 (two) times daily.    . timolol (TIMOPTIC) 0.5 % ophthalmic solution Place 1 drop into both eyes daily. In AM  5   No current facility-administered medications for this visit.    Review of Systems  Constitutional: Positive for malaise/fatigue (energy improving). Negative for chills, diaphoresis, fever and weight loss (up 1 pound).       "Hanging in there". Active.  HENT: Negative.  Negative for congestion, ear discharge, hearing loss, nosebleeds, sinus pain and sore throat.   Eyes: Negative.  Negative for blurred vision, double vision and photophobia.       S/p cataract surgery. Uses reading glasses.  Respiratory: Positive for cough (dry). Negative for hemoptysis, sputum production, shortness of breath and wheezing.   Cardiovascular: Negative.  Negative for chest pain, palpitations, orthopnea and leg swelling.  Gastrointestinal: Positive for diarrhea (occasional) and nausea. Negative for abdominal pain, blood in stool, constipation, heartburn, melena  and vomiting.       Crohn's (no symptoms). Eating well.  Genitourinary: Negative.  Negative for dysuria, frequency, hematuria and urgency.  Musculoskeletal: Negative.  Negative for back pain, joint pain, myalgias and neck pain.  Skin: Positive for itching (secondary to side effects; using gold bond; mild). Negative for rash.       Facial, chest, and back rash associated with fedratinib.  Neurological: Negative for dizziness, tingling, sensory change, focal weakness, weakness and headaches.       Right foot drop, resolved.  Endo/Heme/Allergies: Bruises/bleeds easily.  Psychiatric/Behavioral: Positive for memory loss (mild). Negative for  depression. The patient is not nervous/anxious and does not have insomnia.   All other systems reviewed and are negative.  Performance status (ECOG):  1  Vitals Blood pressure 130/68, pulse 92, temperature (!) 97.2 F (36.2 C), temperature source Tympanic, resp. rate 16, weight 173 lb 8 oz (78.7 kg), SpO2 100 %.   Physical Exam  Constitutional: He is oriented to person, place, and time. He appears well-developed and well-nourished. No distress.  HENT:  Head: Normocephalic and atraumatic.  Mouth/Throat: Oropharynx is clear and moist. No oropharyngeal exudate.  Gray hair. Mask.  Eyes: Pupils are equal, round, and reactive to light. Conjunctivae and EOM are normal. No scleral icterus.  Glasses. Blue eyes.  Neck: No JVD present.  Cardiovascular: Normal rate, regular rhythm and normal heart sounds.  No murmur heard. Pulmonary/Chest: Effort normal and breath sounds normal. No respiratory distress. He has no wheezes. He has no rales. He exhibits no tenderness.  Abdominal: Soft. Bowel sounds are normal. He exhibits no distension and no mass. There is no abdominal tenderness. There is no rebound and no guarding.  Spleen palpable 3FB below LCM.  Musculoskeletal:        General: No tenderness or edema. Normal range of motion.     Cervical back: Normal range of  motion and neck supple.  Lymphadenopathy:       Head (right side): No preauricular, no posterior auricular and no occipital adenopathy present.       Head (left side): No preauricular, no posterior auricular and no occipital adenopathy present.    He has no cervical adenopathy.    He has no axillary adenopathy.       Right: No inguinal and no supraclavicular adenopathy present.       Left: No inguinal and no supraclavicular adenopathy present.  Neurological: He is alert and oriented to person, place, and time.  Skin: Skin is warm and dry. Rash (associated with fedratinib; face, back, and chest; mild) noted. He is not diaphoretic. No erythema. No pallor.  Psychiatric: He has a normal mood and affect. His behavior is normal. Judgment and thought content normal.  Nursing note and vitals reviewed.   No visits with results within 3 Day(s) from this visit.  Latest known visit with results is:  Appointment on 09/20/2019  Component Date Value Ref Range Status  . Sodium 09/20/2019 129* 135 - 145 mmol/L Final  . Potassium 09/20/2019 3.7  3.5 - 5.1 mmol/L Final  . Chloride 09/20/2019 102  98 - 111 mmol/L Final  . CO2 09/20/2019 19* 22 - 32 mmol/L Final  . Glucose, Bld 09/20/2019 128* 70 - 99 mg/dL Final  . BUN 09/20/2019 30* 8 - 23 mg/dL Final  . Creatinine, Ser 09/20/2019 1.82* 0.61 - 1.24 mg/dL Final  . Calcium 09/20/2019 8.4* 8.9 - 10.3 mg/dL Final  . Total Protein 09/20/2019 8.2* 6.5 - 8.1 g/dL Final  . Albumin 09/20/2019 3.4* 3.5 - 5.0 g/dL Final  . AST 09/20/2019 45* 15 - 41 U/L Final  . ALT 09/20/2019 15  0 - 44 U/L Final  . Alkaline Phosphatase 09/20/2019 101  38 - 126 U/L Final  . Total Bilirubin 09/20/2019 0.8  0.3 - 1.2 mg/dL Final  . GFR calc non Af Amer 09/20/2019 36* >60 mL/min Final  . GFR calc Af Amer 09/20/2019 42* >60 mL/min Final  . Anion gap 09/20/2019 8  5 - 15 Final   Performed at [facility], [address] [place], [place] [address]  .  WBC 09/20/2019  43.2* 4.0 - 10.5 K/uL Final  . RBC 09/20/2019 4.99  4.22 - 5.81 MIL/uL Final  . Hemoglobin 09/20/2019 11.0* 13.0 - 17.0 g/dL Final  . HCT 09/20/2019 37.4* 39.0 - 52.0 % Final  . MCV 09/20/2019 74.9* 80.0 - 100.0 fL Final  . MCH 09/20/2019 22.0* 26.0 - 34.0 pg Final  . MCHC 09/20/2019 29.4* 30.0 - 36.0 g/dL Final  . RDW 09/20/2019 22.4* 11.5 - 15.5 % Final  . Platelets 09/20/2019 310  150 - 400 K/uL Final  . nRBC 09/20/2019 1.5* 0.0 - 0.2 % Final  . Neutrophils Relative % 09/20/2019 51  % Final  . Neutro Abs 09/20/2019 29.8* 1.7 - 7.7 K/uL Final  . Band Neutrophils 09/20/2019 18  % Final  . Lymphocytes Relative 09/20/2019 10  % Final  . Lymphs Abs 09/20/2019 4.3* 0.7 - 4.0 K/uL Final  . Monocytes Relative 09/20/2019 8  % Final  . Monocytes Absolute 09/20/2019 3.5* 0.1 - 1.0 K/uL Final  . Eosinophils Relative 09/20/2019 0  % Final  . Eosinophils Absolute 09/20/2019 0.0  0.0 - 0.5 K/uL Final  . Basophils Relative 09/20/2019 1  % Final  . Basophils Absolute 09/20/2019 0.4* 0.0 - 0.1 K/uL Final  . WBC Morphology 09/20/2019 MILD LEFT SHIFT (1-5% METAS, OCC MYELO, OCC BANDS)   Final  . RBC Morphology 09/20/2019 NO SCHISTOCYTES SEEN   Final  . nRBC 09/20/2019 5* 0 /100 WBC Final  . Metamyelocytes Relative 09/20/2019 9  % Final  . Myelocytes 09/20/2019 3  % Final  . Abs Immature Granulocytes 09/20/2019 5.20* 0.00 - 0.07 K/uL Final  . Tear Drop Cells 09/20/2019 PRESENT   Final  . Ovalocytes 09/20/2019 PRESENT   Final  . Giant PLTs 09/20/2019 PRESENT   Final   Performed at [facility], [address], [place], [place] [address]    Assessment:  GARION WEMPE is a 74 y.o. 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 He has had polycythemia dating back to 2013. Hematocrit was 62.1 with a hemoglobin of 19.8 on 03/28/2015. 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 Erythropoietin level was 1.1 (low). 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  He began a phlebotomyprogram  on 03/28/2015 to maintain a hematocrit goal of <45.Last phlebotomywason 05/02/2015. He is on a baby aspirin.  CBC on 11/28/2015 revealed a hematocrit of 39.3, hemoglobin 11.9, platelets 463,000, WBC 32,000 with an ANC of 25,000. Differential included 71% segs, 4% lymphocytes and 17% monocytes. Peripheral smear revealed leukocytosis with predominantly mature neutrophils, increased monocytes and rare blasts (<1%).  Bone marrowon 12/14/2015 revealed a persistent myeloproliferative neoplasm with myelofibrosis and alterations compatible with myelodysplatic progression. Marrow was packed (95-100% cellularity) with pan myelosis, multi-lineage dyspoiesis, and no significant increase in blasts. There was moderate to focally marked reticulin fibrosis (grade 2-3/3). Storage iron was not identified. Flow cytometry revealed non-specific atypical myeloid findings with no increase in blasts. 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 Cytogenetics were normal (46, XY).  B[facility]on 04/07/2016 at [facility] revealed a hypercellular marrow (>95%) with persistent involvement by myeloid neoplasm with 5% blasts. There was mild reticulin fibrosis. FISH t(9;22) results were normal. 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 Cytogenetics are pending.  Bone marrowon 08/09/2018 revealed a hypercellular bone marrow (>95%) with persistent involvement by myeloid neoplasm with <1% blasts by manual touch preparation differential. There was marked reticulin fibrosis with focal collagen deposition. Cytogenetics  were normal (46, XY). - Myeloid mutation panel study is pending.  Bone marrowon 07/14/2019 revealed a hypercellular marrow (>95%) with persistent involvement by a myeloid neoplasm with extensive fibrosis and rare (less than 5%) CD34 +blast byIHC. Myeloid mutation panel  revealed the patient's previously reported variants inIDH2, JAK2, KRAS, NRAS and SRSF2 were again identified. There are no new variants. Cytogenetics are pending.  He was briefly on hydroxyurea500 mg a day (12/05/2015 - 12/19/2015 and 06/25/2016 - 07/03/2016). Platelet count increased to 1.18 million and white count to 54,800 without increased blasts on 06/25/2016. CBC on 07/02/2016 revealed a platelets of 429,000 with a white count 30,600. He began allopurinolon 12/19/2015. He began Jakafi 20 mg BID on 05/28/2016. He has been onJakafi25 mg BID since09/12/2016.  LDHis followed: 466 on 01/02/2016, 596 on 05/23/2016, 663 on 07/30/2016, 522 on 09/24/2016, 638 on 01/05/2017, 625 on 02/25/2017, 1144 on 06/10/2017, 720 on 06/17/2017, 728 on 06/24/2017, and 724 on 01/06/2018. Triglycerideswere 183 (<150) on 08/20/2016, 180 on 01/05/2017, 312 on 06/17/2017, 165 on 11/25/2017, 113 on 02/17/2018.  He has urinary retention. Renal ultrasoundon 07/28/2016 revealed bilateral hydronephrosis (right >left) and a large postvoid residual (2 liters) suggesting bladder dysfunction or outlet obstruction. He underwent temporary Foley catheter placement. He performs I/O self catheterizations. He is on Flomax and finasteride. PSAwas 2.02 on 07/09/2016.  He is followed by GI (Dr. Vira Agar) for a history of polypsand Crohn's disease.He had an unremarkable colonoscopy in 03/2017.AbdomenandpelvisCTon 04/03/2019 revealed mild wall thickening of portions of a loop of small bowel within the pelvis with intervening areas of mildly dilated small bowel,consistent with active Crohn's disease. No evidence of an abscess or fistula. No other evidence of bowel inflammation.There was splenomegaly(16.7 x 7.7 x 15.6 cm), developed since12/11/2011.There was mild bladder wall thickeningandenlargement of the prostate. Chronic bladder outlet obstructionwas felt to bethe etiology of bladder wall  thickening.  He has chronicrenal insufficiency(Cr 1.85; CrCl 36 ml/min). SPEP on 09/23/2016 was negative. Spot urine revealed 17.4% of 27.3 mg/dL.of a monoclonal protein. 24 hour urine on 10/16/2016 revealed no monoclonal protein. Renal function transiently decreased in 06/2017 after Bactrim. He is followed by nephrology and urology.  He has a history of epistaxis. Normal studies included: PT, PTT, von Willebrand panel, and platelet function assay (PFA).  He has right foot drop. 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 There was no non-contrast MRI evidence of brain metastasis. There was only mild for age nonspecific cerebral white signal changes. Lumbar spine MRIon 06/09/2019 showed diffusely abnormal marrow signal in the visible skeleton, as seen on the brain MRI. There was no pathologic fracture and no extraosseous extension of tumor on the lumbar spine or visible sacrum. In conjunction with the splenomegaly by CT in [place] infiltration byleukemia/lymphoma may be the most likely etiology. Differential considerations included multiple myeloma and metastatic disease unknown primary. The involvement of the pelvis should be amenable to bone marrow biopsy.There was superimposed mild for age lumbar spine degeneration with no spinal stenosis.There was mild degenerative L4 and L5 neural foraminal stenosis.  Symptomatically, he is slightly fatigued.  He has a dry cough.  Exam reveals splenomegaly.  Plan: 1.   Labs today: CBC with diff, CMP. 2. Myeloproliferative neoplasm /Polycythemia rubra vera             Clinically,he appears to be doing well on fedratinib. Hematocrit33.7. Hemoglobin10.4. Platelets 185,000. WBC40,800(ANC22,700) on 07/05/2019.  Hematocrit 35.0. Hemoglobin 10.8. Platelets 253,000. WBC 58,600 ([place] 50,400) on 07/14/2019. Hematocrit 37.4. Hemoglobin 11.2. Platelets  194,000.  WBC 43,800 ([place] 28,900) on 08/24/2019.             Hematocrit 36.6. Hemoglobin 10.9. Platelets 228,000. WBC 50,200 ([place] 34,600) on 09/13/2019.             Hematocrit 37.4. Hemoglobin 11.0. Platelets 310,000. WBC 43,200 ([place] 29,800) on 09/19/2019.  Hematocrit 37.3.  Hemoglobin 10.7.  Platelets 245,000.  WBC 27,300 on 10/04/2019. Bone marrow on 07/14/2019 revealeda hypercellular marrow (>95%) with persistent involvement by a myeloid neoplasm with extensive fibrosis and <5% blasts.  Myeloid mutation panel revealed the patient's previously reported variants.  Cytogenetics arecurrently not available. He began fedratinib on 09/09/2019.                         WBC has dropped from 15,000 to 27,000.                          He appears to be tolerating fedratinib fairly well.                         He has persistent hyponatremia (incidence 26%). 3. Iron deficiency Hematocrit37.3. Hemoglobin 10.7. MCV73.3.             Ferritinwas22 on 08/24/2019. He denies any bleeding.             Increaseoral iron to 5 days/week. Continue to monitor. 4.Crohn's disease Patient's Crohn's disease appears quiescent. Continue to monitor. 5.Hyponatremia             Sodium 130             Patient is euvolemic.             Review review water restriction and fluids with electrolytes.             If hyponatremia persists, refer back to Dr. Holley Raring 6.   RTC in 2 weeks for labs (CBC with diff, BMP, ferritin, iron studies). 7.   RTC in 4 weeks for MD assessment and labs (CBC with diff, CMP, LDH).   I discussed the assessment and treatment plan with the patient.  The patient was provided an opportunity to ask questions and all were answered.  The patient agreed with the plan and demonstrated an understanding of the instructions.  The patient was  advised to call back if the symptoms worsen or if the condition fails to improve as anticipated.  I provided 22 minutes of face-to-face time during this this encounter and > 50% was spent counseling as documented under my assessment and plan.    Lequita Asal, MD, PhD    10/04/2019, 8:48 AM  I, Selena Batten, am acting as scribe for [employer]. Mike Gip, MD, PhD.  I, Adyen Bifulco C. Mike Gip, MD, have reviewed the above documentation for accuracy and completeness, and I agree with the above.

## 2019-10-03 ENCOUNTER — Other Ambulatory Visit: Payer: Self-pay

## 2019-10-03 DIAGNOSIS — M21371 Foot drop, right foot: Secondary | ICD-10-CM | POA: Diagnosis not present

## 2019-10-03 DIAGNOSIS — D45 Polycythemia vera: Secondary | ICD-10-CM | POA: Diagnosis not present

## 2019-10-03 NOTE — Progress Notes (Signed)
Confirmed Name and DOB. Denies any concerns.

## 2019-10-04 ENCOUNTER — Inpatient Hospital Stay: Payer: Medicare Other | Attending: Hematology and Oncology | Admitting: Hematology and Oncology

## 2019-10-04 ENCOUNTER — Inpatient Hospital Stay: Payer: Medicare Other

## 2019-10-04 VITALS — BP 130/68 | HR 92 | Temp 97.2°F | Resp 16 | Wt 173.5 lb

## 2019-10-04 DIAGNOSIS — Z800 Family history of malignant neoplasm of digestive organs: Secondary | ICD-10-CM | POA: Insufficient documentation

## 2019-10-04 DIAGNOSIS — R21 Rash and other nonspecific skin eruption: Secondary | ICD-10-CM | POA: Diagnosis not present

## 2019-10-04 DIAGNOSIS — D45 Polycythemia vera: Secondary | ICD-10-CM | POA: Diagnosis not present

## 2019-10-04 DIAGNOSIS — E871 Hypo-osmolality and hyponatremia: Secondary | ICD-10-CM | POA: Insufficient documentation

## 2019-10-04 DIAGNOSIS — I4510 Unspecified right bundle-branch block: Secondary | ICD-10-CM | POA: Insufficient documentation

## 2019-10-04 DIAGNOSIS — N189 Chronic kidney disease, unspecified: Secondary | ICD-10-CM | POA: Insufficient documentation

## 2019-10-04 DIAGNOSIS — R161 Splenomegaly, not elsewhere classified: Secondary | ICD-10-CM | POA: Insufficient documentation

## 2019-10-04 DIAGNOSIS — R05 Cough: Secondary | ICD-10-CM | POA: Insufficient documentation

## 2019-10-04 DIAGNOSIS — M109 Gout, unspecified: Secondary | ICD-10-CM | POA: Insufficient documentation

## 2019-10-04 DIAGNOSIS — R5383 Other fatigue: Secondary | ICD-10-CM | POA: Insufficient documentation

## 2019-10-04 DIAGNOSIS — K5090 Crohn's disease, unspecified, without complications: Secondary | ICD-10-CM | POA: Insufficient documentation

## 2019-10-04 DIAGNOSIS — R197 Diarrhea, unspecified: Secondary | ICD-10-CM | POA: Diagnosis not present

## 2019-10-04 DIAGNOSIS — D500 Iron deficiency anemia secondary to blood loss (chronic): Secondary | ICD-10-CM | POA: Diagnosis not present

## 2019-10-04 DIAGNOSIS — N400 Enlarged prostate without lower urinary tract symptoms: Secondary | ICD-10-CM | POA: Insufficient documentation

## 2019-10-04 DIAGNOSIS — Z87891 Personal history of nicotine dependence: Secondary | ICD-10-CM | POA: Insufficient documentation

## 2019-10-04 DIAGNOSIS — R110 Nausea: Secondary | ICD-10-CM | POA: Insufficient documentation

## 2019-10-04 LAB — COMPREHENSIVE METABOLIC PANEL
ALT: 15 U/L (ref 0–44)
AST: 44 U/L — ABNORMAL HIGH (ref 15–41)
Albumin: 3.2 g/dL — ABNORMAL LOW (ref 3.5–5.0)
Alkaline Phosphatase: 89 U/L (ref 38–126)
Anion gap: 7 (ref 5–15)
BUN: 27 mg/dL — ABNORMAL HIGH (ref 8–23)
CO2: 22 mmol/L (ref 22–32)
Calcium: 8.5 mg/dL — ABNORMAL LOW (ref 8.9–10.3)
Chloride: 101 mmol/L (ref 98–111)
Creatinine, Ser: 1.86 mg/dL — ABNORMAL HIGH (ref 0.61–1.24)
GFR calc Af Amer: 41 mL/min — ABNORMAL LOW (ref >60)
GFR calc non Af Amer: 35 mL/min — ABNORMAL LOW (ref >60)
Glucose, Bld: 112 mg/dL — ABNORMAL HIGH (ref 70–99)
Potassium: 4.1 mmol/L (ref 3.5–5.1)
Sodium: 130 mmol/L — ABNORMAL LOW (ref 135–145)
Total Bilirubin: 0.8 mg/dL (ref 0.3–1.2)
Total Protein: 8.3 g/dL — ABNORMAL HIGH (ref 6.5–8.1)

## 2019-10-04 LAB — CBC
HCT: 37.3 % — ABNORMAL LOW (ref 39.0–52.0)
Hemoglobin: 10.7 g/dL — ABNORMAL LOW (ref 13.0–17.0)
MCH: 21 pg — ABNORMAL LOW (ref 26.0–34.0)
MCHC: 28.7 g/dL — ABNORMAL LOW (ref 30.0–36.0)
MCV: 73.3 fL — ABNORMAL LOW (ref 80.0–100.0)
Platelets: 245 10*3/uL (ref 150–400)
RBC: 5.09 MIL/uL (ref 4.22–5.81)
RDW: 22.3 % — ABNORMAL HIGH (ref 11.5–15.5)
WBC: 27.3 10*3/uL — ABNORMAL HIGH (ref 4.0–10.5)
nRBC: 1.4 % — ABNORMAL HIGH (ref 0.0–0.2)

## 2019-10-05 DIAGNOSIS — D45 Polycythemia vera: Principal | ICD-10-CM

## 2019-10-05 NOTE — Unmapped (Signed)
Howard Villarreal 's Inrebic shipment will be delayed as a result of no refills remain on the prescription.      I have reached out to the patient and communicated the delay. We will call the patient back to reschedule the delivery upon resolution. We have not confirmed the new delivery date.

## 2019-10-06 ENCOUNTER — Telehealth: Payer: Self-pay

## 2019-10-06 ENCOUNTER — Other Ambulatory Visit: Payer: Self-pay

## 2019-10-06 DIAGNOSIS — E871 Hypo-osmolality and hyponatremia: Secondary | ICD-10-CM

## 2019-10-06 MED ORDER — INREBIC 100 MG CAPSULE
ORAL_CAPSULE | Freq: Every day | ORAL | 2 refills | 30 days | Status: CP
Start: 2019-10-06 — End: ?
  Filled 2019-10-06: qty 120, 30d supply, fill #0

## 2019-10-06 MED FILL — INREBIC 100 MG CAPSULE: 30 days supply | Qty: 120 | Fill #0 | Status: AC

## 2019-10-06 NOTE — Unmapped (Signed)
Confirmed new delivery date with patient. It will be shipped via Same Day Courier to prescription address for delivery today, 10/06/2019. Patient has no questions for the pharmacist.

## 2019-10-06 NOTE — Telephone Encounter (Signed)
Informed patient of low sodium level. Encouraged pushing fluids with electrolytes and adding salt to diet. Also informed patient Dr. Mike Gip would like to check additional labs to determine possible etiology. Patient would like to wait until next scheduled lab appt. 10/18/2019. Advised patient to call back should he have any questions. Patient verbalizes understanding and denies any further questions or concerns.

## 2019-10-06 NOTE — Telephone Encounter (Signed)
-----   Message from Lequita Asal, MD sent at 10/05/2019  6:35 AM EST ----- Regarding: Please call patient with sodium level  Sodium 130.  Continue fluids with electrolytes and added salt.  We can check additional labs to determine the etiology:  Urine for: urinalysis, urine sodium, osmolality.  Blood for: serum osmolality, TSH, lipid panel.  M  ----- Message ----- From: Buel Ream, Lab In Reserve Sent: 10/04/2019   8:57 AM EST To: Lequita Asal, MD

## 2019-10-10 ENCOUNTER — Encounter: Payer: Self-pay | Admitting: Hematology and Oncology

## 2019-10-12 ENCOUNTER — Other Ambulatory Visit: Payer: Medicare Other | Admitting: Urology

## 2019-10-12 ENCOUNTER — Telehealth: Payer: Self-pay | Admitting: Hematology and Oncology

## 2019-10-12 ENCOUNTER — Telehealth: Payer: Self-pay

## 2019-10-12 NOTE — Telephone Encounter (Signed)
-----   Message from Secundino Ginger sent at 10/12/2019 10:50 AM EST ----- Regarding: call patient Contact: [phone] [name] tried to call your number yesterday and you were probably already gone. She left a VM on my phone.She said Rush Landmark is having pain in his side and some symptoms. She did not say what symptoms. Please give her a call

## 2019-10-12 NOTE — Telephone Encounter (Signed)
The patient wife called and report that he was having some pain noted to his side. i was able to leave a message for her to call back to the office as soon as possible related to his pain.

## 2019-10-12 NOTE — Telephone Encounter (Signed)
Re:  Left upper quadrant pain  The patient notes a week long history of abdominal discomfort under his left rib cage.  He feels like he pulled a muscle.  Discomfort is not progressive.  He notes coughing a lot.  No hemoptysis or pleuritic chest pain.  He feels like he has a head cold.  No fever.  Discomfort occurs if he moves a certain way.  Discuss symptom management.  We discussed consideration of evaluation in clinic or possible imaging.  He was worried about his spleen.  He denies epigastric pain radiating to the back (elevated pancreatic enzymes associated with fedratinib).  I discussed consideration of COVID testing.  He will consider.  He will contact the clinic if symptoms worsen.   Lequita Asal, MD

## 2019-10-16 ENCOUNTER — Other Ambulatory Visit: Payer: Self-pay | Admitting: Hematology and Oncology

## 2019-10-16 DIAGNOSIS — E790 Hyperuricemia without signs of inflammatory arthritis and tophaceous disease: Secondary | ICD-10-CM

## 2019-10-18 ENCOUNTER — Other Ambulatory Visit: Payer: Self-pay

## 2019-10-18 ENCOUNTER — Inpatient Hospital Stay: Payer: Medicare Other

## 2019-10-18 ENCOUNTER — Other Ambulatory Visit: Payer: Self-pay | Admitting: Hematology and Oncology

## 2019-10-18 DIAGNOSIS — D45 Polycythemia vera: Secondary | ICD-10-CM | POA: Diagnosis not present

## 2019-10-18 DIAGNOSIS — R197 Diarrhea, unspecified: Secondary | ICD-10-CM | POA: Diagnosis not present

## 2019-10-18 DIAGNOSIS — K5090 Crohn's disease, unspecified, without complications: Secondary | ICD-10-CM | POA: Diagnosis not present

## 2019-10-18 DIAGNOSIS — E871 Hypo-osmolality and hyponatremia: Secondary | ICD-10-CM

## 2019-10-18 DIAGNOSIS — R5383 Other fatigue: Secondary | ICD-10-CM | POA: Diagnosis not present

## 2019-10-18 DIAGNOSIS — D751 Secondary polycythemia: Secondary | ICD-10-CM

## 2019-10-18 DIAGNOSIS — D500 Iron deficiency anemia secondary to blood loss (chronic): Secondary | ICD-10-CM

## 2019-10-18 DIAGNOSIS — R110 Nausea: Secondary | ICD-10-CM | POA: Diagnosis not present

## 2019-10-18 LAB — IRON AND TIBC
Iron: 22 ug/dL — ABNORMAL LOW (ref 45–182)
Saturation Ratios: 6 % — ABNORMAL LOW (ref 17.9–39.5)
TIBC: 344 ug/dL (ref 250–450)
UIBC: 322 ug/dL

## 2019-10-18 LAB — CBC WITH DIFFERENTIAL/PLATELET
Abs Immature Granulocytes: 4.34 10*3/uL — ABNORMAL HIGH (ref 0.00–0.07)
Basophils Absolute: 0.4 10*3/uL — ABNORMAL HIGH (ref 0.0–0.1)
Basophils Relative: 1 %
Eosinophils Absolute: 0 10*3/uL (ref 0.0–0.5)
Eosinophils Relative: 0 %
HCT: 41.8 % (ref 39.0–52.0)
Hemoglobin: 11.9 g/dL — ABNORMAL LOW (ref 13.0–17.0)
Immature Granulocytes: 14 %
Lymphocytes Relative: 10 %
Lymphs Abs: 3 10*3/uL (ref 0.7–4.0)
MCH: 20.5 pg — ABNORMAL LOW (ref 26.0–34.0)
MCHC: 28.5 g/dL — ABNORMAL LOW (ref 30.0–36.0)
MCV: 72.1 fL — ABNORMAL LOW (ref 80.0–100.0)
Monocytes Absolute: 3.5 10*3/uL — ABNORMAL HIGH (ref 0.1–1.0)
Monocytes Relative: 12 %
Neutro Abs: 18.9 10*3/uL — ABNORMAL HIGH (ref 1.7–7.7)
Neutrophils Relative %: 63 %
Platelets: 273 10*3/uL (ref 150–400)
RBC: 5.8 MIL/uL (ref 4.22–5.81)
RDW: 23.1 % — ABNORMAL HIGH (ref 11.5–15.5)
WBC: 30.2 10*3/uL — ABNORMAL HIGH (ref 4.0–10.5)
nRBC: 0.7 % — ABNORMAL HIGH (ref 0.0–0.2)

## 2019-10-18 LAB — BASIC METABOLIC PANEL
Anion gap: 9 (ref 5–15)
BUN: 34 mg/dL — ABNORMAL HIGH (ref 8–23)
CO2: 22 mmol/L (ref 22–32)
Calcium: 8.5 mg/dL — ABNORMAL LOW (ref 8.9–10.3)
Chloride: 99 mmol/L (ref 98–111)
Creatinine, Ser: 2.17 mg/dL — ABNORMAL HIGH (ref 0.61–1.24)
GFR calc Af Amer: 34 mL/min — ABNORMAL LOW (ref >60)
GFR calc non Af Amer: 29 mL/min — ABNORMAL LOW (ref >60)
Glucose, Bld: 78 mg/dL (ref 70–99)
Potassium: 4.4 mmol/L (ref 3.5–5.1)
Sodium: 130 mmol/L — ABNORMAL LOW (ref 135–145)

## 2019-10-18 LAB — URINALYSIS, COMPLETE (UACMP) WITH MICROSCOPIC
Bilirubin Urine: NEGATIVE
Glucose, UA: NEGATIVE mg/dL
Hgb urine dipstick: NEGATIVE
Ketones, ur: NEGATIVE mg/dL
Leukocytes,Ua: NEGATIVE
Nitrite: NEGATIVE
Protein, ur: 30 mg/dL — AB
Specific Gravity, Urine: 1.015 (ref 1.005–1.030)
pH: 6 (ref 5.0–8.0)

## 2019-10-18 LAB — LIPID PANEL
Cholesterol: 84 mg/dL (ref 0–200)
HDL: 18 mg/dL — ABNORMAL LOW (ref >40)
LDL Cholesterol: 34 mg/dL (ref 0–99)
Total CHOL/HDL Ratio: 4.7 RATIO
Triglycerides: 162 mg/dL — ABNORMAL HIGH (ref <150)
VLDL: 32 mg/dL (ref 0–40)

## 2019-10-18 LAB — OSMOLALITY: Osmolality: 290 mOsm/kg (ref 275–295)

## 2019-10-18 LAB — FERRITIN: Ferritin: 22 ng/mL — ABNORMAL LOW (ref 24–336)

## 2019-10-18 LAB — SODIUM, URINE, RANDOM: Sodium, Ur: 41 mmol/L

## 2019-10-18 LAB — TSH: TSH: 1.959 u[IU]/mL (ref 0.350–4.500)

## 2019-10-19 ENCOUNTER — Other Ambulatory Visit: Payer: Self-pay | Admitting: Hematology and Oncology

## 2019-10-20 ENCOUNTER — Telehealth: Payer: Self-pay

## 2019-10-20 ENCOUNTER — Inpatient Hospital Stay: Payer: Medicare Other | Admitting: Nurse Practitioner

## 2019-10-20 NOTE — Telephone Encounter (Signed)
Spoke with the patient wife to inform her that B - 1 levels were done on 10/18/2019 and i have spoke with the lab and they are still pending. . the patient was concerend if its the weekend and if she have problems with Bill. I have informed her to call the main line and the will get her in touch with the on call MD. I have informed her to call if she need anything or any changes with Mr Ruben [name]. I will reach out to the patient again tomorrow.The patient wife does report he is doing a lot better today.

## 2019-10-20 NOTE — Telephone Encounter (Signed)
Spoke with the patient wife to see what could we help her with due to changes with her husband. Ms [name] was c/o Mr Ruben [name] having changes remembering things she thinks it is due to his medication he was recently started on from [facility]. Dr Mike Gip has tried to reach the patient was unable to get a answer. So I was able to reach out to the patient this morning and was able to speak with his wife. She do states that Mr Ruben [name] is doing better and she will continue to monitor him. Per Dr Mike Gip she wanted the patient to be seen in [facility] today. Ms Shirlean Mylar and I was able to look at the schedule and there was openings . I was informed by secure chat by Ms Maggie Font that the patient cannot be seen in [facility] today they are no NP present to see patients. I was able to get back in touch with the patient to inform her that Mr bill will not be able to be seen this morning. Ms [name] was understanding and agreed if she have any concerns she will get in touch with the office. I have also informed her I will reach back out to her later today to check on Mr Ruben [name]. I will continue to contact the patient to check his health status. She didn't want to take the patient to the ED at this time she has agreed to continue to monitor the patient for any changes.

## 2019-10-20 NOTE — Telephone Encounter (Signed)
New patient referral has been faxed to Dr Holley Raring office on 10/19/2019. fax conformation confirmed.

## 2019-10-22 LAB — VITAMIN B1: Vitamin B1 (Thiamine): 292.2 nmol/L — ABNORMAL HIGH (ref 66.5–200.0)

## 2019-10-24 ENCOUNTER — Telehealth: Payer: Self-pay

## 2019-10-24 NOTE — Unmapped (Signed)
[facility] Refill Coordination Note    Specialty Medication(s) to be Shipped:   Hematology/Oncology: Inbrebic    Other medication(s) to be shipped: n/a     Howard Villarreal, DOB: 12-13-1945  Phone: [phone] (home)       All above HIPAA information was verified with patient.     Completed refill call assessment today to schedule patient's medication shipment from the [facility] [phone]).       Specialty medication(s) and dose(s) confirmed: Regimen is correct and unchanged.   Changes to medications: Howard Villarreal reports no changes reported at this time.  Changes to insurance: No  Questions for the pharmacist: No    Confirmed patient received Welcome Packet with first shipment. The patient will receive a drug information handout for each medication shipped and additional FDA Medication Guides as required.       DISEASE/MEDICATION-SPECIFIC INFORMATION        N/A    SPECIALTY MEDICATION ADHERENCE     Medication Adherence    Patient reported X missed doses in the last month: 0  Specialty Medication: Inrebic 100mg   Informant: patient  Reliability of informant: reliable  Reasons for non-adherence: no problems identified          Inrebic 100 mg: 13 days of medicine on hand         SHIPPING     Shipping address confirmed in Epic.     Delivery Scheduled: Yes, Expected medication delivery date: 02/09/201.     Medication will be delivered via Next Day Courier to the prescription address in Epic W[facility].    Howard Villarreal   [facility] Specialty Pharmacist

## 2019-10-24 NOTE — Telephone Encounter (Signed)
Spoke with the patient wife to inform her, per Dr Mike Gip she would like for Mr Ruben [name] to do a f/u visit with Dr Holley Raring and his Thamine levels are 292.2 whih has increased from last labs draw. Labs ahs been routed to Dr Holley Raring office.

## 2019-10-28 ENCOUNTER — Ambulatory Visit: Payer: Medicare Other | Attending: Internal Medicine

## 2019-10-28 ENCOUNTER — Other Ambulatory Visit: Payer: Self-pay

## 2019-10-28 DIAGNOSIS — Z23 Encounter for immunization: Secondary | ICD-10-CM | POA: Insufficient documentation

## 2019-10-28 NOTE — Progress Notes (Signed)
   Covid-19 Vaccination Clinic  Name:  ZEB RAWL    MRN: [id number] DOB: [birth year]  10/28/2019  Mr. Heindl was observed post Covid-19 immunization for 15 minutes without incidence. He was provided with Vaccine Information Sheet and instruction to access the V-Safe system.   Mr. Zinn was instructed to call 911 with any severe reactions post vaccine: Marland Kitchen Difficulty breathing  . Swelling of your face and throat  . A fast heartbeat  . A bad rash all over your body  . Dizziness and weakness    Immunizations Administered    Name Date Dose VIS Date Route   Moderna COVID-19 Vaccine 10/28/2019  1:43 PM 0.5 mL 08/23/2019 Intramuscular   Manufacturer: Moderna   Lot: [id number]   [place]: [id number]

## 2019-10-31 ENCOUNTER — Other Ambulatory Visit: Payer: Self-pay

## 2019-10-31 MED FILL — INREBIC 100 MG CAPSULE: 30 days supply | Qty: 120 | Fill #1 | Status: AC

## 2019-10-31 MED FILL — INREBIC 100 MG CAPSULE: ORAL | 30 days supply | Qty: 120 | Fill #1

## 2019-10-31 NOTE — Progress Notes (Signed)
Confirmed name and DOB. Denies any concerns.

## 2019-10-31 NOTE — Progress Notes (Signed)
[facility]  [address], [address] [place], [place] [address] Phone: [phone]  Fax: [phone]   Clinic Day:  11/01/2019  Referring physician: [name], MD  Chief Complaint: Ruben [name] is a 74 y.o. 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   HPI: The patient was last seen in the hematology clinic on 10/04/2019. At that time, he was slightly fatigued.  He had a dry cough.  Exam revealed splenomegaly. Hematocrit 37.3, hemoglobin 10.7, MCV 73.3, platelets 245,000, WBC 27,300. Sodium 130, creatinine 1.86, calcium 8.5, total protein 8.3, albumin 3.2, AST 44. He was tolerating fedratinib. His oral iron was increased to 5 days/week.   Patient was sodium was 130 on 10/04/2019. He was encouraged to push fluids with electrolytes and adding salt to diet on 10/06/2019.   Patient contacted the clinic on 10/12/2019 regarding LUQ pain. He felt like he pulled a muscle.  Discomfort was not progressive.  He noted coughing a lot.  He denied any hemoptysis or pleuritic chest pain.  He felt like he had a head cold.  He denied any fever.  Discomfort occured if he moved a certain way. He was worried about his spleen.  He denied epigastric pain radiating to the back (elevated pancreatic enzymes associated with fedratinib). I discussed symptom management and possible imaging. I advised the patient to have a COVID-19 testing. Patient noted he would contact the clinic if symptoms worsened.    Work up on 10/18/2019 revealed a hematocrit 41.8, hemoglobin 11.9, MCV 72.1, platelets 273,000, WBC 30,200 (ANC 18,900; ALC 3,000), monocyte count 3,500, basophil count 400. Ferritin was 22 with an iron saturation of 6% and a TIBC of 344. Sodium was 130, creatinine 2.17, calcium 8.5. Vitamin B1 was 292.2 (66.5 - 200).  Serum osmolality was 290. Urine osmolality is pending.  Urine sodium was 41. Urinalysis revealed a specific gravity of 1.015.   Lipid panel showed triglycerides 162 and HDL 18. TSH was 1.959 (normal).   During the interim, he has felt tired. He has a cold with associated coughing that has been going for a month. His wife notes that he is doing pretty good. He received his first COVID-19 vaccine on 10/28/2019. He felt sore after the first dose at the injection site. He continues to take fedratinib.   He notes intermittent watery bowels. His wife notes that 1 week ago he was nauseated; lthough he did not throw up, he was spitting out a lot of salvia. He is unsure of the onset. His wife notes that he is not eating as much and he is snacking a lot. He is drinking a lot of Gatorade. I encouraged him to eat more food with calories and drink some Ensure or Boost. He would like some samples from the clinic today.   He gave a urine sample to Dr. Holley Raring. He wife notes he has an upcoming appointment with the dermatologist for the sore he has on the back of his right leg. She reports the sore is healing nicely.    Past Medical History:  Diagnosis Date  . Blood dyscrasia   . BPH (benign prostatic hyperplasia)   . Cancer ([place])    SKIN/ POLYCYTHEMIA VERA  . Chronic kidney disease    RENAL INSUFF (40%)  . Crohn disease ([place])   . Crohn's disease ([place])   . Glaucoma   . Glaucoma   . Gout   . History of chicken pox   .  Myelofibrosis ([place])   . Nosebleed   . RBBB   . Right bundle branch block     Past Surgical History:  Procedure Laterality Date  . CATARACT EXTRACTION W/PHACO Left 07/13/2018   Procedure: CATARACT EXTRACTION PHACO AND INTRAOCULAR LENS PLACEMENT (IOC);  Surgeon: [name], MD;  Location: [facility] ORS;  Service: Ophthalmology;  Laterality: Left;  [place] 00:39  CDE 5.14 Fluid pack lot # [id number]  . CATARACT EXTRACTION W/PHACO Right 08/03/2018   Procedure: CATARACT EXTRACTION PHACO AND INTRAOCULAR LENS PLACEMENT (IOC);  Surgeon: [name], MD;  Location: [facility] ORS;  Service: Ophthalmology;  Laterality:  Right;  [place] 00:43.4 CDE 5.23 Fluid pack Lot # [id number]  . COLONOSCOPY WITH PROPOFOL N/A 03/27/2017   Procedure: COLONOSCOPY WITH PROPOFOL;  Surgeon: [name], MD;  Location: [facility] ENDOSCOPY;  Service: Endoscopy;  Laterality: N/A;  . MOHS SURGERY     EAR  . PROSTATE SURGERY  2008   Prostate Biopsy in [place] to elevated PSA.  per patient normal  . Skin Lesion Basal cell removed    . wart removal     from eyelid    Family History  Problem Relation Age of Onset  . Cancer Mother        Liver  . Cancer Father        Prostate  . Cancer Sister        Hodgkins lymphoma  . Cancer Paternal Aunt        Breast    Social History:  reports that he quit smoking about 16 years ago. His smoking use included cigarettes. He has a 5.00 pack-year smoking history. He has never used smokeless tobacco. He reports current alcohol use of about 4.0 - 5.0 standard drinks of alcohol per week. He reports that he does not use drugs. The patient previously worked for [employer]. He no longer volunteers for [employer]. The patient's wife [phone]).He lives in [place]. The patient is accompanied by [name] on the [place] today.  Allergies: No Known Allergies  Current Medications: Current Outpatient Medications  Medication Sig Dispense Refill  . acetaminophen (TYLENOL) 500 MG tablet Take 500 mg by mouth daily as needed for moderate pain.    Marland Kitchen allopurinol (ZYLOPRIM) 300 MG tablet TAKE 1/2 TABLET (150 MG TOTAL) BY MOUTH DAILY. 45 tablet 1  . Cholecalciferol (VITAMIN D3) 1000 units CAPS Take 1,000 Units by mouth daily at 12 noon.     . Cyanocobalamin (RA VITAMIN B-12 TR) 1000 MCG TBCR Take 1,000 mcg by mouth daily at 12 noon.     . Fedratinib HCl (INREBIC) 100 MG CAPS Take 4 tablets by mouth daily.     . ferrous sulfate 325 (65 FE) MG tablet Take 325 mg by mouth 4 (four) times a week. 5 x weekly    . folic acid (FOLVITE) 1 MG tablet Take 1 mg by mouth daily at 12 noon.     .  latanoprost (XALATAN) 0.005 % ophthalmic solution Place 1 drop into both eyes at bedtime.     . Multiple Vitamins-Minerals (CENTRUM SILVER ULTRA MENS) TABS Take 1 tablet by mouth daily at 12 noon.     . Omega-3 Fatty Acids (KP FISH OIL) 1200 MG CAPS Take 1,200 mg by mouth daily.     . sodium bicarbonate 650 MG tablet Take 650 mg by mouth 2 (two) times daily.    . timolol (TIMOPTIC) 0.5 % ophthalmic solution Place 1 drop into both eyes daily. In AM  5  .  finasteride (PROSCAR) 5 MG tablet Take 1 tablet (5 mg total) by mouth every evening. (Patient not taking: Reported on 10/31/2019) 90 tablet 3   No current facility-administered medications for this visit.    Review of Systems  Constitutional: Positive for malaise/fatigue and weight loss (6 pounds). Negative for chills, diaphoresis and fever.       Feels tired.  HENT: Negative.  Negative for congestion, ear discharge, hearing loss, nosebleeds, sinus pain and sore throat.   Eyes: Negative.  Negative for blurred vision, double vision and photophobia.       S/p cataract surgery. Uses reading glasses.  Respiratory: Positive for cough (getting over a cold). Negative for hemoptysis, sputum production, shortness of breath and wheezing.   Cardiovascular: Negative.  Negative for chest pain, palpitations, orthopnea and leg swelling.  Gastrointestinal: Positive for diarrhea (occasional; watery) and nausea (1 week ago). Negative for abdominal pain, blood in stool, constipation, heartburn, melena and vomiting.       Crohn's (no symptoms). Eating less and snacking more. Drinking Gatorade.  Genitourinary: Negative.  Negative for dysuria, frequency, hematuria and urgency.  Musculoskeletal: Negative.  Negative for back pain, joint pain, myalgias and neck pain.  Skin: Negative for itching (secondary to side effects; using gold bond; mild) and rash.       Facial, chest, and back rash associated with fedratinib, stable.  Neurological: Negative for dizziness,  tingling, sensory change, focal weakness, weakness and headaches.  Endo/Heme/Allergies: Bruises/bleeds easily.  Psychiatric/Behavioral: Positive for memory loss (mild). Negative for depression. The patient is not nervous/anxious and does not have insomnia.   All other systems reviewed and are negative.  Performance status (ECOG): 1  Vitals Blood pressure 105/64, pulse 73, temperature (!) 95.1 F (35.1 C), temperature source Tympanic, resp. rate 20, weight 167 lb 8.8 oz (76 kg), SpO2 100 %.   Physical Exam  Constitutional: He is oriented to person, place, and time. He appears well-developed and well-nourished. No distress.  HENT:  Head: Normocephalic and atraumatic.  Mouth/Throat: Oropharynx is clear and moist. No oropharyngeal exudate.  Gray hair. Mask.  Eyes: Pupils are equal, round, and reactive to light. Conjunctivae and EOM are normal. No scleral icterus.  Glasses. Blue eyes.  Neck: No JVD present.  Cardiovascular: Normal rate, regular rhythm and normal heart sounds.  No murmur heard. Pulmonary/Chest: Effort normal and breath sounds normal. No respiratory distress. He has no wheezes. He has no rales. He exhibits no tenderness.  Abdominal: Soft. Bowel sounds are normal. He exhibits no distension and no mass. There is no abdominal tenderness. There is no rebound and no guarding.  Spleen tip palpable.  Musculoskeletal:        General: No tenderness or edema. Normal range of motion.     Cervical back: Normal range of motion and neck supple.  Lymphadenopathy:       Head (right side): No preauricular, no posterior auricular and no occipital adenopathy present.       Head (left side): No preauricular, no posterior auricular and no occipital adenopathy present.    He has no cervical adenopathy.    He has no axillary adenopathy.       Right: No inguinal and no supraclavicular adenopathy present.       Left: No inguinal and no supraclavicular adenopathy present.  Neurological: He is  alert and oriented to person, place, and time.  Skin: Skin is warm and dry. No rash noted. He is not diaphoretic. No erythema. No pallor.  Psychiatric: He has a  normal mood and affect. His behavior is normal. Judgment and thought content normal.  Nursing note and vitals reviewed.   Appointment on 11/01/2019  Component Date Value Ref Range Status  . LDH 11/01/2019 656* 98 - 192 U/L Final   Performed at [facility], [address] [place], [place] [address]  . Sodium 11/01/2019 132* 135 - 145 mmol/L Final  . Potassium 11/01/2019 4.4  3.5 - 5.1 mmol/L Final  . Chloride 11/01/2019 102  98 - 111 mmol/L Final  . CO2 11/01/2019 22  22 - 32 mmol/L Final  . Glucose, Bld 11/01/2019 86  70 - 99 mg/dL Final  . BUN 11/01/2019 35* 8 - 23 mg/dL Final  . Creatinine, Ser 11/01/2019 2.00* 0.61 - 1.24 mg/dL Final  . Calcium 11/01/2019 8.6* 8.9 - 10.3 mg/dL Final  . Total Protein 11/01/2019 8.8* 6.5 - 8.1 g/dL Final  . Albumin 11/01/2019 3.5  3.5 - 5.0 g/dL Final  . AST 11/01/2019 45* 15 - 41 U/L Final  . ALT 11/01/2019 16  0 - 44 U/L Final  . Alkaline Phosphatase 11/01/2019 91  38 - 126 U/L Final  . Total Bilirubin 11/01/2019 0.8  0.3 - 1.2 mg/dL Final  . GFR calc non Af Amer 11/01/2019 32* >60 mL/min Final  . GFR calc Af Amer 11/01/2019 38* >60 mL/min Final  . Anion gap 11/01/2019 8  5 - 15 Final   Performed at [facility], [address] [place], [place] [address]  . WBC 11/01/2019 28.2* 4.0 - 10.5 K/uL Final  . RBC 11/01/2019 5.72  4.22 - 5.81 MIL/uL Final  . Hemoglobin 11/01/2019 11.8* 13.0 - 17.0 g/dL Final  . HCT 11/01/2019 41.8  39.0 - 52.0 % Final  . MCV 11/01/2019 73.1* 80.0 - 100.0 fL Final  . MCH 11/01/2019 20.6* 26.0 - 34.0 pg Final  . MCHC 11/01/2019 28.2* 30.0 - 36.0 g/dL Final  . RDW 11/01/2019 23.9* 11.5 - 15.5 % Final  . Platelets 11/01/2019 260  150 - 400 K/uL Final  . nRBC 11/01/2019 0.6* 0.0 - 0.2 % Final   Performed at [facility], [address], [place],  [address]  . Neutrophils Relative % 11/01/2019 PENDING  % Incomplete  . Neutro Abs 11/01/2019 PENDING  1.7 - 7.7 K/uL Incomplete  . Band Neutrophils 11/01/2019 PENDING  % Incomplete  . Lymphocytes Relative 11/01/2019 PENDING  % Incomplete  . Lymphs Abs 11/01/2019 PENDING  0.7 - 4.0 K/uL Incomplete  . Monocytes Relative 11/01/2019 PENDING  % Incomplete  . Monocytes Absolute 11/01/2019 PENDING  0.1 - 1.0 K/uL Incomplete  . Eosinophils Relative 11/01/2019 PENDING  % Incomplete  . Eosinophils Absolute 11/01/2019 PENDING  0.0 - 0.5 K/uL Incomplete  . Basophils Relative 11/01/2019 PENDING  % Incomplete  . Basophils Absolute 11/01/2019 PENDING  0.0 - 0.1 K/uL Incomplete  . WBC Morphology 11/01/2019 PENDING   Incomplete  . RBC Morphology 11/01/2019 PENDING   Incomplete  . Smear Review 11/01/2019 PENDING   Incomplete  . Other 11/01/2019 PENDING  % Incomplete  . nRBC 11/01/2019 PENDING  0 /100 WBC Incomplete  . Metamyelocytes Relative 11/01/2019 PENDING  % Incomplete  . Myelocytes 11/01/2019 PENDING  % Incomplete  . Promyelocytes Relative 11/01/2019 PENDING  % Incomplete  . Blasts 11/01/2019 PENDING  % Incomplete  . Immature Granulocytes 11/01/2019 PENDING  % Incomplete  . Abs Immature Granulocytes 11/01/2019 PENDING  0.00 - 0.07 K/uL Incomplete    Assessment:  Ruben Oris  [name] is a 74 y.o. 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 He has had polycythemia dating back to 2013. Hematocrit was 62.1 with a hemoglobin of 19.8 on 03/28/2015. JAK 2 testing on 03/28/2015 revealed [id number] mutation. Erythropoietin level was 1.1 (low). 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  He began a phlebotomyprogram on 03/28/2015 to maintain a hematocrit goal of <45.Last phlebotomywason 05/02/2015. He is on a baby aspirin.  CBC on 11/28/2015 revealed a hematocrit of 39.3, hemoglobin 11.9, platelets 463,000, WBC 32,000 with an ANC of 25,000. Differential included 71% segs, 4%  lymphocytes and 17% monocytes. Peripheral smear revealed leukocytosis with predominantly mature neutrophils, increased monocytes and rare blasts (<1%).  Bone marrowon 12/14/2015 revealed a persistent myeloproliferative neoplasm with myelofibrosis and alterations compatible with myelodysplatic progression. Marrow was packed (95-100% cellularity) with pan myelosis, multi-lineage dyspoiesis, and no significant increase in blasts. There was moderate to focally marked reticulin fibrosis (grade 2-3/3). Storage iron was not identified. Flow cytometry revealed non-specific atypical myeloid findings with no increase in blasts. 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 Cytogenetics were normal (46, XY).  B[facility] marrowon 04/07/2016 at [facility] revealed a hypercellular marrow (>95%) with persistent involvement by myeloid neoplasm with 5% blasts. There was mild reticulin fibrosis. FISH t(9;22) results were normal. 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 Cytogenetics are pending.  Bone marrowon 08/09/2018 revealed a hypercellular bone marrow (>95%) with persistent involvement by myeloid neoplasm with <1% blasts by manual touch preparation differential. There was marked reticulin fibrosis with focal collagen deposition. Cytogenetics were normal (46, XY). - Myeloid mutation panel study is pending.  Bone marrowon 07/14/2019 revealed a hypercellular marrow (>95%) with persistent involvement by a myeloid neoplasm with extensive fibrosis and rare (less than 5%) CD34 +blast byIHC. Myeloid mutation panel revealed the patient's previously reported variants inIDH2, JAK2, KRAS, NRAS and SRSF2 were again identified. There are no new variants. Cytogenetics are pending.  He was briefly on hydroxyurea500 mg a day (12/05/2015 - 12/19/2015 and 06/25/2016 - 07/03/2016). Platelet  count increased to 1.18 million and white count to 54,800 without increased blasts on 06/25/2016. CBC on 07/02/2016 revealed a platelets of 429,000 with a white count 30,600. He began allopurinolon 12/19/2015. He began Jakafi 20 mg BID on 05/28/2016. He has been onJakafi25 mg BID since09/12/2016.  LDHis followed: 466 on 01/02/2016, 596 on 05/23/2016, 663 on 07/30/2016, 522 on 09/24/2016, 638 on 01/05/2017, 625 on 02/25/2017, 1144 on 06/10/2017, 720 on 06/17/2017, 728 on 06/24/2017, and 724 on 01/06/2018. Triglycerideswere 183 (<150) on 08/20/2016, 180 on 01/05/2017, 312 on 06/17/2017, 165 on 11/25/2017, 113 on 02/17/2018.  He has urinary retention. Renal ultrasoundon 07/28/2016 revealed bilateral hydronephrosis (right >left) and a large postvoid residual (2 liters) suggesting bladder dysfunction or outlet obstruction. He underwent temporary Foley catheter placement. He performs I/O self catheterizations. He is on Flomax and finasteride. PSAwas 2.02 on 07/09/2016.  He is followed by GI (Dr. Vira Agar) for a history of polypsand Crohn's disease.He had an unremarkable colonoscopy in 03/2017.AbdomenandpelvisCTon 04/03/2019 revealed mild wall thickening of portions of a loop of small bowel within the pelvis with intervening areas of mildly dilated small bowel,consistent with active Crohn's disease. No evidence of an abscess or fistula. No other evidence of bowel inflammation.There was splenomegaly(16.7 x 7.7 x 15.6 cm), developed since12/11/2011.There was mild bladder wall thickeningandenlargement of the prostate. Chronic bladder outlet obstructionwas felt to bethe etiology of bladder wall thickening.  He has chronicrenal insufficiency(Cr 1.85; CrCl 36 ml/min).  SPEP on 09/23/2016 was negative. Spot urine revealed 17.4% of 27.3 mg/dL.of a monoclonal protein. 24 hour urine on 10/16/2016 revealed no monoclonal protein. Renal function transiently decreased in 06/2017  after Bactrim. He is followed by nephrology and urology.  He has a history of epistaxis. Normal studies included: PT, PTT, von Willebrand panel, and platelet function assay (PFA).  He has right foot drop. 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 There was no non-contrast MRI evidence of brain metastasis. There was only mild for age nonspecific cerebral white signal changes. Lumbar spine MRIon 06/09/2019 showed diffusely abnormal marrow signal in the visible skeleton, as seen on the brain MRI. There was no pathologic fracture and no extraosseous extension of tumor on the lumbar spine or visible sacrum. In conjunction with the splenomegaly by CT in [place] infiltration byleukemia/lymphoma may be the most likely etiology. Differential considerations included multiple myeloma and metastatic disease unknown primary. The involvement of the pelvis should be amenable to bone marrow biopsy.There was superimposed mild for age lumbar spine degeneration with no spinal stenosis.There was mild degenerative L4 and L5 neural foraminal stenosis.  He received his first COVID-19 vaccine on 10/28/2019.  Symptomatically, he is fatigued.  She has a chronic cough.  Exam is stable.  Sodium is 132.  Plan: 1.   Labs today: CBC with diff, CMP, LDH, SPEP. 2. Myeloproliferative neoplasm /Polycythemia rubra vera Clinically, he is doing well on fedratinib. Hematocrit 37.4. Hemoglobin 11.0. Platelets310,000. GQQ76,195 ([id number]) on 09/19/2019.             Hematocrit 37.3.  Hemoglobin 10.7.  Platelets 245,000.  WBC 27,300 on 10/04/2019.  Hematocrit 41.8. Hemoglobin 11.8. Platelets260,000. [id number] (ANC20,000) on 09/19/2019. Bone marrow on 07/14/2019 revealeda hypercellular marrow (>95%) with persistent involvement by a myeloid neoplasm with extensive fibrosis and <5% blasts.  Myeloid  mutation panel revealed the patient's previously reported variants.  Cytogenetics arecurrently not available. He began fedratinibon 09/09/2019. WBC has dropped from 50,200 to 28,200.  Continue to monitor blast count closely.   He is tolerating fedratinib well. He has persistent hyponatremia (incidence 26%). 3. Iron deficiency Hematocrit41.8. Hemoglobin 11.8. MCV73.1. Ferritin was 22 on 10/18/2019. He denies any bleeding. Continue oral iron 5 days a week. Continue to monitor hemoglobin with possible discontinuation of oral iron in the next 1-2 months. 4.Hyponatremia Sodium 132 Patient remains euvolemic. Continue free water restriction and encourage fluids with electrolytes. Patient to follow-up with Dr. Holley Raring. 5.   Elevated total protein  Protein 8.8.  Creatinine 2.0 (CrCl 35.9 ml/min)  Check SPEP. 6.   RTC in 4 weeks for labs (CBC with diff, BMP, ferritin, iron studies). 7.   RTC in 8 weeks for MD assessment and labs (CBC with diff, CMP, LDH).  I discussed the assessment and treatment plan with the patient.  The patient was provided an opportunity to ask questions and all were answered.  The patient agreed with the plan and demonstrated an understanding of the instructions.  The patient was advised to call back if the symptoms worsen or if the condition fails to improve as anticipated.  I provided 20 minutes of face-to-face time during this this encounter and > 50% was spent counseling as documented under my assessment and plan.    Lequita Asal, MD, PhD    11/01/2019, 10:58 AM  I, Selena Batten, am acting as scribe for [employer]. Mike Gip, MD, PhD.  I, Shamecka Hocutt C. Mike Gip, MD, have reviewed the above documentation for accuracy and  completeness, and I  agree with the above.

## 2019-11-01 ENCOUNTER — Inpatient Hospital Stay: Payer: Medicare Other | Attending: Hematology and Oncology | Admitting: Hematology and Oncology

## 2019-11-01 ENCOUNTER — Inpatient Hospital Stay: Payer: Medicare Other

## 2019-11-01 ENCOUNTER — Encounter: Payer: Self-pay | Admitting: Hematology and Oncology

## 2019-11-01 VITALS — BP 105/64 | HR 73 | Temp 95.1°F | Resp 20 | Wt 167.5 lb

## 2019-11-01 DIAGNOSIS — E871 Hypo-osmolality and hyponatremia: Secondary | ICD-10-CM

## 2019-11-01 DIAGNOSIS — R778 Other specified abnormalities of plasma proteins: Secondary | ICD-10-CM

## 2019-11-01 DIAGNOSIS — D500 Iron deficiency anemia secondary to blood loss (chronic): Secondary | ICD-10-CM | POA: Diagnosis not present

## 2019-11-01 DIAGNOSIS — D45 Polycythemia vera: Secondary | ICD-10-CM | POA: Diagnosis not present

## 2019-11-01 LAB — CBC WITH DIFFERENTIAL/PLATELET
Abs Immature Granulocytes: 2.5 10*3/uL — ABNORMAL HIGH (ref 0.00–0.07)
Band Neutrophils: 8 %
Basophils Absolute: 1.7 10*3/uL — ABNORMAL HIGH (ref 0.0–0.1)
Basophils Relative: 6 %
Blasts: 1 %
Eosinophils Absolute: 0 10*3/uL (ref 0.0–0.5)
Eosinophils Relative: 0 %
HCT: 41.8 % (ref 39.0–52.0)
Hemoglobin: 11.8 g/dL — ABNORMAL LOW (ref 13.0–17.0)
Lymphocytes Relative: 8 %
Lymphs Abs: 2.3 10*3/uL (ref 0.7–4.0)
MCH: 20.6 pg — ABNORMAL LOW (ref 26.0–34.0)
MCHC: 28.2 g/dL — ABNORMAL LOW (ref 30.0–36.0)
MCV: 73.1 fL — ABNORMAL LOW (ref 80.0–100.0)
Metamyelocytes Relative: 3 %
Monocytes Absolute: 1.4 10*3/uL — ABNORMAL HIGH (ref 0.1–1.0)
Monocytes Relative: 5 %
Myelocytes: 6 %
Neutro Abs: 20 10*3/uL — ABNORMAL HIGH (ref 1.7–7.7)
Neutrophils Relative %: 63 %
Platelets: 260 10*3/uL (ref 150–400)
RBC: 5.72 MIL/uL (ref 4.22–5.81)
RDW: 23.9 % — ABNORMAL HIGH (ref 11.5–15.5)
Smear Review: NORMAL
WBC: 28.2 10*3/uL — ABNORMAL HIGH (ref 4.0–10.5)
nRBC: 0.6 % — ABNORMAL HIGH (ref 0.0–0.2)
nRBC: 1 /100 WBC — ABNORMAL HIGH

## 2019-11-01 LAB — COMPREHENSIVE METABOLIC PANEL
ALT: 16 U/L (ref 0–44)
AST: 45 U/L — ABNORMAL HIGH (ref 15–41)
Albumin: 3.5 g/dL (ref 3.5–5.0)
Alkaline Phosphatase: 91 U/L (ref 38–126)
Anion gap: 8 (ref 5–15)
BUN: 35 mg/dL — ABNORMAL HIGH (ref 8–23)
CO2: 22 mmol/L (ref 22–32)
Calcium: 8.6 mg/dL — ABNORMAL LOW (ref 8.9–10.3)
Chloride: 102 mmol/L (ref 98–111)
Creatinine, Ser: 2 mg/dL — ABNORMAL HIGH (ref 0.61–1.24)
GFR calc Af Amer: 38 mL/min — ABNORMAL LOW (ref >60)
GFR calc non Af Amer: 32 mL/min — ABNORMAL LOW (ref >60)
Glucose, Bld: 86 mg/dL (ref 70–99)
Potassium: 4.4 mmol/L (ref 3.5–5.1)
Sodium: 132 mmol/L — ABNORMAL LOW (ref 135–145)
Total Bilirubin: 0.8 mg/dL (ref 0.3–1.2)
Total Protein: 8.8 g/dL — ABNORMAL HIGH (ref 6.5–8.1)

## 2019-11-01 LAB — LACTATE DEHYDROGENASE: LDH: 656 U/L — ABNORMAL HIGH (ref 98–192)

## 2019-11-01 NOTE — Progress Notes (Signed)
Patient states that he has a cold/cough that wont go away for about a month+ now. Patient states that he has received his first covid vaccine

## 2019-11-02 LAB — PROTEIN ELECTROPHORESIS, SERUM
A/G Ratio: 0.7 (ref 0.7–1.7)
Albumin ELP: 3.4 g/dL (ref 2.9–4.4)
Alpha-1-Globulin: 0.3 g/dL (ref 0.0–0.4)
Alpha-2-Globulin: 0.6 g/dL (ref 0.4–1.0)
Beta Globulin: 1 g/dL (ref 0.7–1.3)
Gamma Globulin: 3.3 g/dL — ABNORMAL HIGH (ref 0.4–1.8)
Globulin, Total: 5.1 g/dL — ABNORMAL HIGH (ref 2.2–3.9)
Total Protein ELP: 8.5 g/dL (ref 6.0–8.5)

## 2019-11-03 DIAGNOSIS — L111 Transient acantholytic dermatosis [Grover]: Secondary | ICD-10-CM | POA: Diagnosis not present

## 2019-11-03 DIAGNOSIS — D2261 Melanocytic nevi of right upper limb, including shoulder: Secondary | ICD-10-CM | POA: Diagnosis not present

## 2019-11-03 DIAGNOSIS — X32XXXA Exposure to sunlight, initial encounter: Secondary | ICD-10-CM | POA: Diagnosis not present

## 2019-11-03 DIAGNOSIS — D2272 Melanocytic nevi of left lower limb, including hip: Secondary | ICD-10-CM | POA: Diagnosis not present

## 2019-11-03 DIAGNOSIS — L570 Actinic keratosis: Secondary | ICD-10-CM | POA: Diagnosis not present

## 2019-11-03 DIAGNOSIS — D2262 Melanocytic nevi of left upper limb, including shoulder: Secondary | ICD-10-CM | POA: Diagnosis not present

## 2019-11-03 DIAGNOSIS — Z85828 Personal history of other malignant neoplasm of skin: Secondary | ICD-10-CM | POA: Diagnosis not present

## 2019-11-03 DIAGNOSIS — Q828 Other specified congenital malformations of skin: Secondary | ICD-10-CM | POA: Diagnosis not present

## 2019-11-03 DIAGNOSIS — D485 Neoplasm of uncertain behavior of skin: Secondary | ICD-10-CM | POA: Diagnosis not present

## 2019-11-03 DIAGNOSIS — L821 Other seborrheic keratosis: Secondary | ICD-10-CM | POA: Diagnosis not present

## 2019-11-08 ENCOUNTER — Other Ambulatory Visit: Payer: Self-pay

## 2019-11-08 ENCOUNTER — Encounter: Payer: Self-pay | Admitting: Urology

## 2019-11-08 ENCOUNTER — Other Ambulatory Visit: Payer: Self-pay | Admitting: *Deleted

## 2019-11-08 ENCOUNTER — Ambulatory Visit (INDEPENDENT_AMBULATORY_CARE_PROVIDER_SITE_OTHER): Payer: Medicare Other | Admitting: Urology

## 2019-11-08 VITALS — BP 136/76 | HR 91 | Ht 72.0 in | Wt 170.0 lb

## 2019-11-08 DIAGNOSIS — N138 Other obstructive and reflux uropathy: Secondary | ICD-10-CM

## 2019-11-08 DIAGNOSIS — N401 Enlarged prostate with lower urinary tract symptoms: Secondary | ICD-10-CM | POA: Diagnosis not present

## 2019-11-08 DIAGNOSIS — R339 Retention of urine, unspecified: Secondary | ICD-10-CM

## 2019-11-08 MED ORDER — FINASTERIDE 5 MG PO TABS: 5.0000 mg | ORAL_TABLET | Freq: Every evening | ORAL | 3 refills | Status: AC

## 2019-11-08 NOTE — Progress Notes (Signed)
   11/08/19  CC:  Chief Complaint  Patient presents with  . 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  He underwent urodynamics and had tentatively scheduled holmium laser enucleation of the prostate to relieve his outlet but ultimately elected not to pursue this due to multiple medical comorbidities and in light of COVID-19 pandemic.  He is doing fine with self cath, managing this up to 5 times a day without difficulty.  He does not otherwise void spontaneously.  He is satisfied with this.  No significant infections or any other issues.  Not considering outlet surgery at this time.  Most recent creatinine 2.0 on 2/21, essentially stable  Blood pressure 136/76, pulse 91, height 6' (1.829 m), weight 170 lb (77.1 kg). NED. A&Ox3.   No respiratory distress   Abd soft, NT, ND Normal phallus with bilateral descended testicles  Cystoscopy Procedure Note  Patient identification was confirmed, informed consent was obtained, and patient was prepped using Betadine solution.  Lidocaine jelly was administered per urethral meatus.     Pre-Procedure: - Inspection reveals a normal caliber ureteral meatus.  Procedure: The flexible cystoscope was introduced without difficulty - No urethral strictures/lesions are present. - Enlarged prostate trilobar coaptation - Elevated bladder neck - Bilateral ureteral orifices identified - Bladder mucosa  reveals no ulcers, tumors, or lesions - No bladder stones -Heavily trabeculated, especially on the posterior lateral aspect with 1 relatively narrow mouth diverticulum on the left  Retroflexion shows intravesical protrusion of lateral lobes as well as median lobe component   Post-Procedure: - Patient tolerated the procedure well  Assessment/ Plan:  1. Benign prostatic hyperplasia with urinary obstruction Managed with  chronic clean intermittent catheterization up to 5 times a day as needed  Continue finasteride  Discussed outlet procedure again today, do feel that he would benefit at least some from this but ultimately he is not interested at this time.   - Urinalysis, Complete  2. Urinary retention As above    Return in about 1 year (around 11/07/2020) for Cysto .  Hollice Espy, MD

## 2019-11-09 LAB — URINALYSIS, COMPLETE
Bilirubin, UA: NEGATIVE
Glucose, UA: NEGATIVE
Ketones, UA: NEGATIVE
Leukocytes,UA: NEGATIVE
Nitrite, UA: NEGATIVE
RBC, UA: NEGATIVE
Specific Gravity, UA: 1.02 (ref 1.005–1.030)
Urobilinogen, Ur: 0.2 mg/dL (ref 0.2–1.0)
pH, UA: 5.5 (ref 5.0–7.5)

## 2019-11-09 LAB — MICROSCOPIC EXAMINATION
Epithelial Cells (non renal): NONE SEEN /hpf (ref 0–10)
RBC, Urine: NONE SEEN /hpf (ref 0–2)
WBC, UA: NONE SEEN /hpf (ref 0–5)

## 2019-11-18 DIAGNOSIS — X32XXXA Exposure to sunlight, initial encounter: Secondary | ICD-10-CM | POA: Diagnosis not present

## 2019-11-18 DIAGNOSIS — L565 Disseminated superficial actinic porokeratosis (DSAP): Secondary | ICD-10-CM | POA: Diagnosis not present

## 2019-11-18 DIAGNOSIS — L570 Actinic keratosis: Secondary | ICD-10-CM | POA: Diagnosis not present

## 2019-11-22 NOTE — Unmapped (Signed)
[facility] Refill Coordination Note    Specialty Medication(s) to be Shipped:   Hematology/Oncology: Inbrebic    Other medication(s) to be shipped: n/a     Margaret Pyle, DOB: 02/28/1946  Phone: [phone] (home)       All above HIPAA information was verified with patient.     Completed refill call assessment today to schedule patient's medication shipment from the [facility] [phone]).       Specialty medication(s) and dose(s) confirmed: Regimen is correct and unchanged.   Changes to medications: Yoni reports no changes reported at this time.  Changes to insurance: No  Questions for the pharmacist: No    Confirmed patient received Welcome Packet with first shipment. The patient will receive a drug information handout for each medication shipped and additional FDA Medication Guides as required.       DISEASE/MEDICATION-SPECIFIC INFORMATION        N/A    SPECIALTY MEDICATION ADHERENCE     Medication Adherence    Patient reported X missed doses in the last month: 0  Specialty Medication: Inrebic 100 mg  Patient is on additional specialty medications: No  Informant: patient  Reasons for non-adherence: no problems identified  Confirmed plan for next specialty medication refill: delivery by pharmacy  Refills needed for supportive medications: not needed          Inrebic 100 mg: 16 days of medicine on hand         SHIPPING     Shipping address confirmed in Epic.     Delivery Scheduled: Yes, Expected medication delivery date: 11/30/19.     Medication will be delivered via Next Day Courier to the prescription address in Epic WAM.    Carl Bleecker Vangie Bicker   [facility] Specialty Pharmacist

## 2019-11-29 ENCOUNTER — Ambulatory Visit: Payer: Medicare Other | Attending: Internal Medicine

## 2019-11-29 ENCOUNTER — Inpatient Hospital Stay: Payer: Medicare Other | Attending: Nurse Practitioner

## 2019-11-29 ENCOUNTER — Other Ambulatory Visit: Payer: Self-pay

## 2019-11-29 DIAGNOSIS — D45 Polycythemia vera: Secondary | ICD-10-CM | POA: Diagnosis not present

## 2019-11-29 DIAGNOSIS — Z23 Encounter for immunization: Secondary | ICD-10-CM | POA: Insufficient documentation

## 2019-11-29 DIAGNOSIS — D500 Iron deficiency anemia secondary to blood loss (chronic): Secondary | ICD-10-CM

## 2019-11-29 LAB — CBC WITH DIFFERENTIAL/PLATELET
Abs Immature Granulocytes: 5.8 10*3/uL — ABNORMAL HIGH (ref 0.00–0.07)
Band Neutrophils: 13 %
Basophils Absolute: 0.6 10*3/uL — ABNORMAL HIGH (ref 0.0–0.1)
Basophils Relative: 2 %
Eosinophils Absolute: 0 10*3/uL (ref 0.0–0.5)
Eosinophils Relative: 0 %
HCT: 43.1 % (ref 39.0–52.0)
Hemoglobin: 12.2 g/dL — ABNORMAL LOW (ref 13.0–17.0)
Lymphocytes Relative: 9 %
Lymphs Abs: 2.8 10*3/uL (ref 0.7–4.0)
MCH: 20.9 pg — ABNORMAL LOW (ref 26.0–34.0)
MCHC: 28.3 g/dL — ABNORMAL LOW (ref 30.0–36.0)
MCV: 73.9 fL — ABNORMAL LOW (ref 80.0–100.0)
Metamyelocytes Relative: 7 %
Monocytes Absolute: 1.5 10*3/uL — ABNORMAL HIGH (ref 0.1–1.0)
Monocytes Relative: 5 %
Myelocytes: 7 %
Neutro Abs: 19.3 10*3/uL — ABNORMAL HIGH (ref 1.7–7.7)
Neutrophils Relative %: 50 %
Other: 2 %
Platelets: 283 10*3/uL (ref 150–400)
Promyelocytes Relative: 5 %
RBC: 5.83 MIL/uL — ABNORMAL HIGH (ref 4.22–5.81)
RDW: 26.3 % — ABNORMAL HIGH (ref 11.5–15.5)
WBC: 30.7 10*3/uL — ABNORMAL HIGH (ref 4.0–10.5)
nRBC: 0.5 % — ABNORMAL HIGH (ref 0.0–0.2)
nRBC: 3 /100 WBC — ABNORMAL HIGH

## 2019-11-29 LAB — BASIC METABOLIC PANEL
Anion gap: 10 (ref 5–15)
BUN: 29 mg/dL — ABNORMAL HIGH (ref 8–23)
CO2: 21 mmol/L — ABNORMAL LOW (ref 22–32)
Calcium: 8.8 mg/dL — ABNORMAL LOW (ref 8.9–10.3)
Chloride: 100 mmol/L (ref 98–111)
Creatinine, Ser: 1.94 mg/dL — ABNORMAL HIGH (ref 0.61–1.24)
GFR calc Af Amer: 39 mL/min — ABNORMAL LOW (ref >60)
GFR calc non Af Amer: 34 mL/min — ABNORMAL LOW (ref >60)
Glucose, Bld: 78 mg/dL (ref 70–99)
Potassium: 4.4 mmol/L (ref 3.5–5.1)
Sodium: 131 mmol/L — ABNORMAL LOW (ref 135–145)

## 2019-11-29 LAB — IRON AND TIBC
Iron: 52 ug/dL (ref 45–182)
Saturation Ratios: 19 % (ref 17.9–39.5)
TIBC: 276 ug/dL (ref 250–450)
UIBC: 224 ug/dL

## 2019-11-29 LAB — PATHOLOGIST SMEAR REVIEW

## 2019-11-29 LAB — FERRITIN: Ferritin: 67 ng/mL (ref 24–336)

## 2019-11-29 MED FILL — INREBIC 100 MG CAPSULE: 30 days supply | Qty: 120 | Fill #2 | Status: AC

## 2019-11-29 MED FILL — INREBIC 100 MG CAPSULE: ORAL | 30 days supply | Qty: 120 | Fill #2

## 2019-11-29 NOTE — Progress Notes (Signed)
   Covid-19 Vaccination Clinic  Name:  Ruben [name]    MRN: [id number] DOB: Dec 14, 1945  11/29/2019  [name] was observed post Covid-19 immunization for 15 minutes without incident. He was provided with Vaccine Information Sheet and instruction to access the V-Safe system.   [name] was instructed to call 911 with any severe reactions post vaccine: Marland Kitchen Difficulty breathing  . Swelling of face and throat  . A fast heartbeat  . A bad rash all over body  . Dizziness and weakness   Immunizations Administered    Name Date Dose VIS Date Route   Moderna COVID-19 Vaccine 11/29/2019  1:17 PM 0.5 mL 08/23/2019 Intramuscular   Manufacturer: Moderna   Lot: [id number]   [place]: [id number]

## 2019-12-02 DIAGNOSIS — H01003 Unspecified blepharitis right eye, unspecified eyelid: Secondary | ICD-10-CM | POA: Diagnosis not present

## 2019-12-02 MED ORDER — NEOMYCIN-POLYMYXIN-DEXAMETH 3.5 MG/ML-10,000 UNIT/ML-0.1% EYE DROPS
0 days
Start: 2019-12-02 — End: ?

## 2019-12-04 DIAGNOSIS — R778 Other specified abnormalities of plasma proteins: Secondary | ICD-10-CM | POA: Insufficient documentation

## 2019-12-05 ENCOUNTER — Other Ambulatory Visit: Payer: Self-pay

## 2019-12-05 ENCOUNTER — Telehealth: Payer: Self-pay

## 2019-12-05 DIAGNOSIS — E790 Hyperuricemia without signs of inflammatory arthritis and tophaceous disease: Secondary | ICD-10-CM

## 2019-12-05 MED ORDER — ALLOPURINOL 300 MG PO TABS: ORAL_TABLET | ORAL | 1 refills | Status: DC

## 2019-12-05 NOTE — Telephone Encounter (Signed)
spoke with the patient to inform him , per Dr Mike Gip she would like for him to stop the oral Iron.The patient was understanding and agreeabe.

## 2019-12-05 NOTE — Telephone Encounter (Signed)
Spoke with the patient to inform him of his refill the patient was understanding.

## 2019-12-23 ENCOUNTER — Encounter: Payer: Self-pay | Admitting: Hematology and Oncology

## 2019-12-26 ENCOUNTER — Encounter: Payer: Self-pay | Admitting: Hematology and Oncology

## 2019-12-26 ENCOUNTER — Other Ambulatory Visit: Payer: Self-pay

## 2019-12-26 NOTE — Progress Notes (Signed)
[facility]  [address] [place], [place] [address] Phone: [phone]  Fax: [phone]   Clinic Day:  12/27/2019  Referring physician: [name], MD  Chief Complaint: Ruben [name] is a 74 y.o. 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   HPI: The patient was last seen in the hematology clinic on 11/01/2019. At that time, he was fatigued. She had a chronic cough. Exam was stable. Hematocrit was 41.8, hemoglobin 11.8, MCV 73.1, platelets 260,000, WBC 28,200 (ANC 20,000), monocyte count 1,400, basophil count 1,700. LDH was 656. Sodium was 132. Creatinine was 2.00, calcium 8.6, total protein 8.8, AST 45.  Free water restriction and fluids with electrolytes were encouraged.  He continued oral iron 5 days a week.   Labs on 11/29/2019 included hematocrit 43.1, hemoglobin 12.2, MCV 73.9, platelets 283,000, WBC 30,700 (ANC 19,300), monocyte count 1,500, basophil count 600. Ferritin was 67 with an iron saturation of 19% and a TIBC of 276. Sodium was 131, creatinine 1.94, calcium 8.8.   Peripheral smear review on 11/29/2019 revealed leukocytosis with left shift and approximately 5% blasts. Neutrophils had dysplastic features including pseudo Pelger Huet change.  There was microcytic anemia with circulating nucleated RBCs. Platelets were adequate in number with variation in size. Findings were consistent with the patient's known myeloproliferative neoplasm.   Patient was seen by Dr. Hollice Espy on 11/08/2019 for and annual cystoscopy. Cystoscopy revealed no bladder lesions.  He has benign prostatic hyperplasia with urinary obstruction.  He undergoes intermittent catheterization 5x/day.  Bladder outlet procedure was discussed again; patient declined.  He continued finasteride.   Patient was contacted on 12/05/2019 to stop taking oral iron.   During the interim, the patient has been doing well. His weight is  down 7 pounds. He denies any nausea, vomiting or diarrhea. He noticed some weight loss secondary to using the second hole in his belt. He is eating well and snacking throughout the day. [name] notes that he is eating a lot slower but he is eating all of his food. He drinks Gatorade. He denies early satiety. [name] was concerned about his sugar intake and question what type of fluids he should push. I recommended he drink fluids with electrolytes. He denies any issues with his blood sugar. He reports being off oral iron x 1 month. He reports some mild diarrhea that comes and goes.   He refused the COVID-19 vaccine (Moderna) 1 month ago.   Past Medical History:  Diagnosis Date  . Blood dyscrasia   . BPH (benign prostatic hyperplasia)   . Cancer ([place])    SKIN/ POLYCYTHEMIA VERA  . Chronic kidney disease    RENAL INSUFF (40%)  . Crohn disease ([place])   . Crohn's disease ([place])   . Glaucoma   . Glaucoma   . Gout   . History of chicken pox   . Myelofibrosis ([place])   . Nosebleed   . RBBB   . Right bundle branch block     Past Surgical History:  Procedure Laterality Date  . CATARACT EXTRACTION W/PHACO Left 07/13/2018   Procedure: CATARACT EXTRACTION PHACO AND INTRAOCULAR LENS PLACEMENT (IOC);  Surgeon: [name], MD;  Location: [facility] ORS;  Service: Ophthalmology;  Laterality: Left;  [place] 00:39  CDE 5.14 Fluid pack lot # [id number]  . CATARACT EXTRACTION W/PHACO Right 08/03/2018   Procedure: CATARACT EXTRACTION PHACO AND INTRAOCULAR LENS PLACEMENT (IOC);  Surgeon: [name], MD;  Location: [facility]  ORS;  Service: Ophthalmology;  Laterality: Right;  [place] 00:43.4 CDE 5.23 Fluid pack Lot # [id number]  . COLONOSCOPY WITH PROPOFOL N/A 03/27/2017   Procedure: COLONOSCOPY WITH PROPOFOL;  Surgeon: [name], MD;  Location: [facility] ENDOSCOPY;  Service: Endoscopy;  Laterality: N/A;  . MOHS SURGERY     EAR  . PROSTATE SURGERY  2008   Prostate Biopsy in [place] to elevated PSA.  per  patient normal  . Skin Lesion Basal cell removed    . wart removal     from eyelid    Family History  Problem Relation Age of Onset  . Cancer Mother        Liver  . Cancer Father        Prostate  . Cancer Sister        Hodgkins lymphoma  . Cancer Paternal Aunt        Breast    Social History:  reports that he quit smoking about 16 years ago. His smoking use included cigarettes. He has a 5.00 pack-year smoking history. He has never used smokeless tobacco. He reports current alcohol use of about 4.0 - 5.0 standard drinks of alcohol per week. He reports that he does not use drugs. The patient previously worked for [employer]. He no longer volunteers for [employer]. The patient's wife [phone]).He lives in [place].  The patient is accompanied by [name] today.  Allergies: No Known Allergies  Current Medications: Current Outpatient Medications  Medication Sig Dispense Refill  . acetaminophen (TYLENOL) 500 MG tablet Take 500 mg by mouth daily as needed for moderate pain.    Marland Kitchen allopurinol (ZYLOPRIM) 300 MG tablet TAKE 1/2 TABLET (150 MG TOTAL) BY MOUTH DAILY. 45 tablet 1  . Cholecalciferol (VITAMIN D3) 1000 units CAPS Take 1,000 Units by mouth daily at 12 noon.     . Cyanocobalamin (RA VITAMIN B-12 TR) 1000 MCG TBCR Take 1,000 mcg by mouth daily at 12 noon.     . Fedratinib HCl (INREBIC) 100 MG CAPS Take 4 tablets by mouth daily.     . finasteride (PROSCAR) 5 MG tablet Take 1 tablet (5 mg total) by mouth every evening. 90 tablet 3  . folic acid (FOLVITE) 1 MG tablet Take 1 mg by mouth daily at 12 noon.     . latanoprost (XALATAN) 0.005 % ophthalmic solution Place 1 drop into both eyes at bedtime.     . Multiple Vitamins-Minerals (CENTRUM SILVER ULTRA MENS) TABS Take 1 tablet by mouth daily at 12 noon.     . Omega-3 Fatty Acids (KP FISH OIL) 1200 MG CAPS Take 1,200 mg by mouth daily.     . sodium bicarbonate 650 MG tablet Take 650 mg by mouth 2 (two) times daily.     . timolol (TIMOPTIC) 0.5 % ophthalmic solution Place 1 drop into both eyes daily. In AM  5  . ferrous sulfate 325 (65 FE) MG tablet Take 325 mg by mouth 4 (four) times a week. 5 x weekly     No current facility-administered medications for this visit.    Review of Systems  Constitutional: Positive for weight loss (7 lbs). Negative for chills, diaphoresis, fever and malaise/fatigue.       Doing well.  HENT: Negative.  Negative for congestion, ear discharge, hearing loss, nosebleeds, sinus pain and sore throat.   Eyes: Negative.  Negative for blurred vision, double vision and photophobia.       S/p cataract surgery. Uses reading glasses.  Respiratory:  Negative for cough (getting over a cold), hemoptysis, sputum production, shortness of breath and wheezing.   Cardiovascular: Negative.  Negative for chest pain, palpitations, orthopnea and leg swelling.  Gastrointestinal: Negative for abdominal pain, blood in stool, constipation, diarrhea (come and goes; mild), heartburn, melena and vomiting. Nausea: 1 week ago.       Crohn's (no symptoms). Eating well and snacking more. Drinking Gatorade.  Genitourinary: Negative.  Negative for dysuria, frequency, hematuria and urgency.  Musculoskeletal: Negative.  Negative for back pain, joint pain, myalgias and neck pain.  Skin: Negative for itching (secondary to side effects; using gold bond; mild) and rash.       Facial, chest, and back rash associated with fedratinib, stable.  Neurological: Negative for dizziness, tingling, sensory change, focal weakness, weakness and headaches.  Endo/Heme/Allergies: Bruises/bleeds easily.  Psychiatric/Behavioral: Positive for memory loss (mild). Negative for depression. The patient is not nervous/anxious and does not have insomnia.   All other systems reviewed and are negative.  Performance status (ECOG): 1  Vitals Blood pressure 115/67, pulse 78, temperature (!) 96.5 F (35.8 C), temperature source Tympanic, resp.  rate 18, height 6' (1.829 m), weight 163 lb 11.1 oz (74.2 kg), SpO2 100 %.   Physical Exam Vitals and nursing note reviewed.  Constitutional:      General: He is not in acute distress.    Appearance: He is well-developed and well-nourished. He is not diaphoretic.  HENT:     Head: Normocephalic and atraumatic.     Mouth/Throat:     Mouth: Oropharynx is clear and moist.     Pharynx: No oropharyngeal exudate.      Comments: Gray hair. Mask. Eyes:     General: No scleral icterus.    Extraocular Movements: EOM normal.     Conjunctiva/sclera: Conjunctivae normal.     Pupils: Pupils are equal, round, and reactive to light.     Comments: Glasses. Blue eyes.  Neck:     Vascular: No JVD.  Cardiovascular:     Rate and Rhythm: Normal rate and regular rhythm.     Heart sounds: Normal heart sounds. No murmur heard.   Pulmonary:     Effort: Pulmonary effort is normal. No respiratory distress.     Breath sounds: Normal breath sounds. No wheezing or rales.  Chest:     Chest wall: No tenderness.  Abdominal:     General: Bowel sounds are normal. There is no distension.     Palpations: Abdomen is soft. There is splenomegaly (2 FB below the left costal margin). There is no mass.     Tenderness: There is no abdominal tenderness. There is no guarding or rebound.  Musculoskeletal:        General: No tenderness or edema. Normal range of motion.     Cervical back: Normal range of motion and neck supple.  Lymphadenopathy:     Head:     Right side of head: No preauricular, posterior auricular or occipital adenopathy.     Left side of head: No preauricular, posterior auricular or occipital adenopathy.     Cervical: No cervical adenopathy.     Upper Body:  No axillary adenopathy present.    Right upper body: No supraclavicular adenopathy.     Left upper body: No supraclavicular adenopathy.     Lower Body: No right inguinal adenopathy. No left inguinal adenopathy.  Skin:    General: Skin is warm  and dry.     Coloration: Skin is not pale.  Findings: No erythema or rash.  Neurological:     Mental Status: He is alert and oriented to person, place, and time.  Psychiatric:        Mood and Affect: Mood and affect normal.        Behavior: Behavior normal.        Thought Content: Thought content normal.        Judgment: Judgment normal.    Appointment on 12/27/2019  Component Date Value Ref Range Status  . WBC 12/27/2019 27.9* 4.0 - 10.5 K/uL Final  . RBC 12/27/2019 4.82  4.22 - 5.81 MIL/uL Final  . Hemoglobin 12/27/2019 10.8* 13.0 - 17.0 g/dL Final  . HCT 12/27/2019 36.6* 39.0 - 52.0 % Final  . MCV 12/27/2019 75.9* 80.0 - 100.0 fL Final  . MCH 12/27/2019 22.4* 26.0 - 34.0 pg Final  . MCHC 12/27/2019 29.5* 30.0 - 36.0 g/dL Final  . RDW 12/27/2019 27.9* 11.5 - 15.5 % Final  . Platelets 12/27/2019 303  150 - 400 K/uL Final  . nRBC 12/27/2019 0.6* 0.0 - 0.2 % Final   Performed at [facility], [address], [place], [place] [address]  . Neutrophils Relative % 12/27/2019 PENDING  % Incomplete  . Neutro Abs 12/27/2019 PENDING  1.7 - 7.7 K/uL Incomplete  . Band Neutrophils 12/27/2019 PENDING  % Incomplete  . Lymphocytes Relative 12/27/2019 PENDING  % Incomplete  . Lymphs Abs 12/27/2019 PENDING  0.7 - 4.0 K/uL Incomplete  . Monocytes Relative 12/27/2019 PENDING  % Incomplete  . Monocytes Absolute 12/27/2019 PENDING  0.1 - 1.0 K/uL Incomplete  . Eosinophils Relative 12/27/2019 PENDING  % Incomplete  . Eosinophils Absolute 12/27/2019 PENDING  0.0 - 0.5 K/uL Incomplete  . Basophils Relative 12/27/2019 PENDING  % Incomplete  . Basophils Absolute 12/27/2019 PENDING  0.0 - 0.1 K/uL Incomplete  . WBC Morphology 12/27/2019 PENDING   Incomplete  . RBC Morphology 12/27/2019 PENDING   Incomplete  . Smear Review 12/27/2019 PENDING   Incomplete  . Other 12/27/2019 PENDING  % Incomplete  . nRBC 12/27/2019 PENDING  0 /100 WBC Incomplete  . Metamyelocytes Relative 12/27/2019  PENDING  % Incomplete  . Myelocytes 12/27/2019 PENDING  % Incomplete  . Promyelocytes Relative 12/27/2019 PENDING  % Incomplete  . Blasts 12/27/2019 PENDING  % Incomplete  . Immature Granulocytes 12/27/2019 PENDING  % Incomplete  . Abs Immature Granulocytes 12/27/2019 PENDING  0.00 - 0.07 K/uL Incomplete    Assessment:  Ruben [name] is a 74 y.o. 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 He has had polycythemia dating back to 2013. Hematocrit was 62.1 with a hemoglobin of 19.8 on 03/28/2015. 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 Erythropoietin level was 1.1 (low). 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  He began a phlebotomyprogram on 03/28/2015 to maintain a hematocrit goal of <45.Last phlebotomywason 05/02/2015. He is on a baby aspirin.  CBC on 11/28/2015 revealed a hematocrit of 39.3, hemoglobin 11.9, platelets 463,000, WBC 32,000 with an ANC of 25,000. Differential included 71% segs, 4% lymphocytes and 17% monocytes. Peripheral smear revealed leukocytosis with predominantly mature neutrophils, increased monocytes and rare blasts (<1%).  Bone marrowon 12/14/2015 revealed a persistent myeloproliferative neoplasm with myelofibrosis and alterations compatible with myelodysplatic progression. Marrow was packed (95-100% cellularity) with pan myelosis, multi-lineage dyspoiesis, and no significant increase in blasts. There was moderate to focally marked reticulin fibrosis (grade 2-3/3). Storage iron was not identified. Flow cytometry revealed non-specific atypical myeloid findings with no increase in blasts. 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 Cytogenetics were normal (46, XY).  [facility]on [facility]/07/2016 [facility] revealed a hypercellular marrow (>95%) with persistent involvement by myeloid neoplasm with 5% blasts. There was mild reticulin fibrosis. FISH t(9;22) results were normal.  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 Cytogenetics are pending.  Bone marrowon 08/09/2018 revealed a hypercellular bone marrow (>95%) with persistent involvement by myeloid neoplasm with <1% blasts by manual touch preparation differential. There was marked reticulin fibrosis with focal collagen deposition. Cytogenetics were normal (46, XY). - Myeloid mutation panel study is pending.  Bone marrowon 07/14/2019 revealed a hypercellular marrow (>95%) with persistent involvement by a myeloid neoplasm with extensive fibrosis and rare (less than 5%) CD34 +blast byIHC. Myeloid mutation panel revealed the patient's previously reported variants inIDH2, JAK2, KRAS, NRAS and SRSF2 were again identified. There are no new variants. Cytogenetics are pending.  He was briefly on hydroxyurea500 mg a day (12/05/2015 - 12/19/2015 and 06/25/2016 - 07/03/2016). Platelet count increased to 1.18 million and white count to 54,800 without increased blasts on 06/25/2016. CBC on 07/02/2016 revealed a platelets of 429,000 with a white count 30,600. He began allopurinolon 12/19/2015. He began Jakafi 20 mg BID on 05/28/2016. He has been onJakafi25 mg BID since09/12/2016.  LDHis followed: 466 on 01/02/2016, 596 on 05/23/2016, 663 on 07/30/2016, 522 on 09/24/2016, 638 on 01/05/2017, 625 on 02/25/2017, 1144 on 06/10/2017, 720 on 06/17/2017, 728 on 06/24/2017, and 724 on 01/06/2018. Triglycerideswere 183 (<150) on 08/20/2016, 180 on 01/05/2017, 312 on 06/17/2017, 165 on 11/25/2017, 113 on 02/17/2018.  He has urinary retention. Renal ultrasoundon 07/28/2016 revealed bilateral hydronephrosis (right >left) and a large postvoid residual (2 liters) suggesting bladder dysfunction or outlet obstruction. He underwent temporary Foley catheter placement. He performs I/O self catheterizations. He is on Flomax and finasteride. PSAwas 2.02 on  07/09/2016.  He is followed by GI (Dr. Vira Agar) for a history of polypsand Crohn's disease.He had an unremarkable colonoscopy in 03/2017.AbdomenandpelvisCTon 04/03/2019 revealed mild wall thickening of portions of a loop of small bowel within the pelvis with intervening areas of mildly dilated small bowel,consistent with active Crohn's disease. No evidence of an abscess or fistula. No other evidence of bowel inflammation.There was splenomegaly(16.7 x 7.7 x 15.6 cm), developed since12/11/2011.There was mild bladder wall thickeningandenlargement of the prostate. Chronic bladder outlet obstructionwas felt to bethe etiology of bladder wall thickening.  He has chronicrenal insufficiency(Cr 1.85; CrCl 36 ml/min). SPEP on 09/23/2016 was negative. Spot urine revealed 17.4% of 27.3 mg/dL of a monoclonal protein. 24 hour urine on 10/16/2016 revealed no monoclonal protein. Renal function transiently decreased in 06/2017 after Bactrim. 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  He is followed by nephrology and urology.  He has a history of epistaxis. Normal studies included: PT, PTT, von Willebrand panel, and platelet function assay (PFA).  He has right foot drop. 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 There was no non-contrast MRI evidence of brain metastasis. There was only mild for age nonspecific cerebral white signal changes. Lumbar spine MRIon 06/09/2019 showed diffusely abnormal marrow signal in the visible skeleton, as seen on the brain MRI. There was no pathologic fracture and no extraosseous extension of tumor on the lumbar spine or visible sacrum. In conjunction with the splenomegaly by CT in [place] infiltration byleukemia/lymphoma may be the most likely etiology. Differential considerations included multiple myeloma and metastatic disease unknown primary. The involvement of the pelvis should be  amenable to bone  marrow biopsy.There was superimposed mild for age lumbar spine degeneration with no spinal stenosis.There was mild degenerative L4 and L5 neural foraminal stenosis.  He received his first COVID-19 vaccine on 10/28/2019.  Symptomatically, he appears to be doing well.  Weight is down 7 pounds.  Exam reveals mild splenomegaly without adenopathy.  WBC is 27,900 with 1% blasts.  Plan: 1.   Labs today: CBC with diff, CMP, LDH, ferritin. 2.   Peripheral smear for path review. 3. Myeloproliferative neoplasm /Polycythemia rubra vera Clinically,  he appears to be doing fairly well on fedratinib. Hematocrit 36.6. Hemoglobin 10.8. Platelets 303,000. FMZ04,045 ([id number]) today. Bone marrow on 07/14/2019 revealeda hypercellular marrow (>95%) with persistent involvement by a myeloid neoplasm with extensive fibrosis and <5% blasts.  Myeloid mutation panel revealed the patient's previously reported variants.  He began fedratinibon 09/09/2019. WBChas dropped from 50,200to28,200.  Continue to monitor blast count closely.                         He is tolerating fedratinib well. Monitor hyponatremia (incidence 26%).  Continue monthly assessment of counts and peripheral smear. 3. Iron deficiency Hematocrit36.6. Hemoglobin 10.8. MCV75.9 today. Ferritin 166. He denies any bleeding.  Oral iron currently on hold. Continue to monitor hemoglobin monthly. 4.Hyponatremia [id number]. Fluid status remains euvolemic. Continue free water restriction and encourage fluids with electrolytes. Patient is followed by Dr. Holley Raring. 5.   Return to lab for flow cytometry. 6.   RTC in 1 month for labs (CBC with diff, CMP, ferritin, iron  studies, LDH). 7.   RTC in 2 months for MD assessment, labs (CBC with diff, CMP, LDH, ferritin).  I discussed the assessment and treatment plan with the patient.  The patient was provided an opportunity to ask questions and all were answered.  The patient agreed with the plan and demonstrated an understanding of the instructions.  The patient was advised to call back if the symptoms worsen or if the condition fails to improve as anticipated.  I provided 20 minutes of face-to-face time during this this encounter and > 50% was spent counseling as documented under my assessment and plan.    Lequita Asal, MD, PhD    12/27/2019, 11:05 AM  I, Selena Batten, am acting as scribe for [employer]. Mike Gip, MD, PhD.  I, Lyndsee Casa C. Mike Gip, MD, have reviewed the above documentation for accuracy and completeness, and I agree with the above.

## 2019-12-26 NOTE — Progress Notes (Signed)
No new changes noted today. The patient name and DOB has been verified by phone today.

## 2019-12-27 ENCOUNTER — Inpatient Hospital Stay: Payer: Medicare Other | Attending: Hematology and Oncology | Admitting: Hematology and Oncology

## 2019-12-27 ENCOUNTER — Encounter: Payer: Self-pay | Admitting: Hematology and Oncology

## 2019-12-27 ENCOUNTER — Inpatient Hospital Stay: Payer: Medicare Other

## 2019-12-27 ENCOUNTER — Other Ambulatory Visit: Payer: Self-pay | Admitting: Hematology and Oncology

## 2019-12-27 VITALS — BP 115/67 | HR 78 | Temp 96.5°F | Resp 18 | Ht 72.0 in | Wt 163.7 lb

## 2019-12-27 DIAGNOSIS — D45 Polycythemia vera: Secondary | ICD-10-CM | POA: Insufficient documentation

## 2019-12-27 DIAGNOSIS — E871 Hypo-osmolality and hyponatremia: Secondary | ICD-10-CM

## 2019-12-27 DIAGNOSIS — D500 Iron deficiency anemia secondary to blood loss (chronic): Secondary | ICD-10-CM

## 2019-12-27 DIAGNOSIS — R778 Other specified abnormalities of plasma proteins: Secondary | ICD-10-CM

## 2019-12-27 DIAGNOSIS — D471 Chronic myeloproliferative disease: Secondary | ICD-10-CM

## 2019-12-27 LAB — CBC WITH DIFFERENTIAL/PLATELET
Abs Immature Granulocytes: 3.6 10*3/uL — ABNORMAL HIGH (ref 0.00–0.07)
Band Neutrophils: 12 %
Basophils Absolute: 0.6 10*3/uL — ABNORMAL HIGH (ref 0.0–0.1)
Basophils Relative: 2 %
Blasts: 8 %
Eosinophils Absolute: 0.3 10*3/uL (ref 0.0–0.5)
Eosinophils Relative: 1 %
HCT: 36.6 % — ABNORMAL LOW (ref 39.0–52.0)
Hemoglobin: 10.8 g/dL — ABNORMAL LOW (ref 13.0–17.0)
Lymphocytes Relative: 9 %
Lymphs Abs: 2.5 10*3/uL (ref 0.7–4.0)
MCH: 22.4 pg — ABNORMAL LOW (ref 26.0–34.0)
MCHC: 29.5 g/dL — ABNORMAL LOW (ref 30.0–36.0)
MCV: 75.9 fL — ABNORMAL LOW (ref 80.0–100.0)
Metamyelocytes Relative: 5 %
Monocytes Absolute: 2 10*3/uL — ABNORMAL HIGH (ref 0.1–1.0)
Monocytes Relative: 7 %
Myelocytes: 7 %
Neutro Abs: 16.7 10*3/uL — ABNORMAL HIGH (ref 1.7–7.7)
Neutrophils Relative %: 48 %
Platelets: 303 10*3/uL (ref 150–400)
Promyelocytes Relative: 1 %
RBC: 4.82 MIL/uL (ref 4.22–5.81)
RDW: 27.9 % — ABNORMAL HIGH (ref 11.5–15.5)
WBC: 27.9 10*3/uL — ABNORMAL HIGH (ref 4.0–10.5)
nRBC: 0.6 % — ABNORMAL HIGH (ref 0.0–0.2)
nRBC: 1 /100 WBC — ABNORMAL HIGH

## 2019-12-27 LAB — COMPREHENSIVE METABOLIC PANEL
ALT: 15 U/L (ref 0–44)
AST: 54 U/L — ABNORMAL HIGH (ref 15–41)
Albumin: 3.6 g/dL (ref 3.5–5.0)
Alkaline Phosphatase: 100 U/L (ref 38–126)
Anion gap: 8 (ref 5–15)
BUN: 46 mg/dL — ABNORMAL HIGH (ref 8–23)
CO2: 24 mmol/L (ref 22–32)
Calcium: 8.8 mg/dL — ABNORMAL LOW (ref 8.9–10.3)
Chloride: 100 mmol/L (ref 98–111)
Creatinine, Ser: 2.23 mg/dL — ABNORMAL HIGH (ref 0.61–1.24)
GFR calc Af Amer: 33 mL/min — ABNORMAL LOW (ref >60)
GFR calc non Af Amer: 28 mL/min — ABNORMAL LOW (ref >60)
Glucose, Bld: 88 mg/dL (ref 70–99)
Potassium: 4.6 mmol/L (ref 3.5–5.1)
Sodium: 132 mmol/L — ABNORMAL LOW (ref 135–145)
Total Bilirubin: 0.6 mg/dL (ref 0.3–1.2)
Total Protein: 9.1 g/dL — ABNORMAL HIGH (ref 6.5–8.1)

## 2019-12-27 LAB — FERRITIN: Ferritin: 166 ng/mL (ref 24–336)

## 2019-12-27 LAB — PATHOLOGIST SMEAR REVIEW

## 2019-12-27 LAB — LACTATE DEHYDROGENASE: LDH: 432 U/L — ABNORMAL HIGH (ref 98–192)

## 2019-12-27 NOTE — Unmapped (Signed)
[facility] Refill Coordination Note    Specialty Medication(s) to be Shipped:   Hematology/Oncology: Inbrebic   **Sent refill request**    Other medication(s) to be shipped: N/A     Howard Villarreal, DOB: 01/08/1946  Phone: [phone] (home)       All above HIPAA information was verified with patient.     Was a Nurse, learning disability used for this call? No    Completed refill call assessment today to schedule patient's medication shipment from the [facility] [phone]).       Specialty medication(s) and dose(s) confirmed: Regimen is correct and unchanged.   Changes to medications: Oley reports no changes at this time.  Changes to insurance: No  Questions for the pharmacist: No    Confirmed patient received Welcome Packet with first shipment. The patient will receive a drug information handout for each medication shipped and additional FDA Medication Guides as required.       DISEASE/MEDICATION-SPECIFIC INFORMATION        N/A    SPECIALTY MEDICATION ADHERENCE     Medication Adherence    Patient reported X missed doses in the last month: 0  Specialty Medication: Inrebic 100mg   Patient is on additional specialty medications: No          Inrebic 100 mg: 11 days of medicine on hand     SHIPPING     Shipping address confirmed in Epic.     Delivery Scheduled: Yes, Expected medication delivery date: 01/03/2020.     Medication will be delivered via UPS to the prescription address in Epic [place].    Lorelei Pont [facility] Specialty Technician

## 2019-12-29 LAB — COMP PANEL: LEUKEMIA/LYMPHOMA: Immunophenotypic Profile: 5

## 2020-01-02 DIAGNOSIS — D45 Polycythemia vera: Principal | ICD-10-CM

## 2020-01-02 MED ORDER — INREBIC 100 MG CAPSULE
ORAL_CAPSULE | Freq: Every day | ORAL | 2 refills | 30 days | Status: CP
Start: 2020-01-02 — End: ?
  Filled 2020-01-05: qty 120, 30d supply, fill #0

## 2020-01-02 NOTE — Unmapped (Signed)
Margaret Pyle 's [facility] shipment will be delayed as a result of no refills remain on the prescription.      I have reached out to the patient and left a voicemail message.  We will call the patient back to reschedule the delivery upon resolution. We have not confirmed the new delivery date.

## 2020-01-02 NOTE — Unmapped (Signed)
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    Current fedratinib dose: 400 mg once daily (started 09/09/19)    Plan:  -continue fedratinib 400 mg once daily  -ok for 3 months refill of fedratinib  -recommend not to take Sleepy Time Tea (has two ingredients that had been reported to inhibit metabolism 3A4 subtrates (fedratinib is metabolized by this enzyme system)    Counseled patient on:  -sign and symptoms of hyponatremia  -Not ok to take Sleepy Time Tea ([employer]el[employer] Seasoning)    Prescriptions:  -fedratinib 100 mg #120 (30 days supply, 2 refills)  -Prescriptions sent electronically to patient's preferred pharmacy [facility]    Mr.Mastandrea and wife verbalized understanding of the informaion provided and had no further questions.       ___________________________________________________________________    Interim patient history  Patient reported following  -no new signs/symptoms with fedratinib  -no new medications     Sleepy Time Tourist information centre manager) has  Chamomile (moderate CYP3A4 inhibitor), spearmint, lemongrass (moderate CYP3A4 inhibitor), tilia flowers, blackberry leaves, orange blossoms, hawthorn and rosebuds.       Approximate face time spent with patient: 12 minutes    Audie Box, PharmD, BCOP, CPP  Clinical Pharmacist Practitioner, [facility]

## 2020-01-04 DIAGNOSIS — D45 Polycythemia vera: Principal | ICD-10-CM

## 2020-01-04 NOTE — Unmapped (Signed)
Margaret Pyle 's [facility] shipment will be sent out  as a result of a new prescription for the medication has been received.      I have reached out to the patient and communicated the delivery change. We will reschedule the medication for the delivery date that the patient agreed upon.  We have confirmed the delivery date as 01/06/20 VIA UPS

## 2020-01-04 NOTE — Unmapped (Signed)
Per test claim for [facility] at the [facility], patient needs Medication Assistance Program for High Copay OF $906.25.

## 2020-01-05 MED FILL — INREBIC 100 MG CAPSULE: 30 days supply | Qty: 120 | Fill #0 | Status: AC

## 2020-01-09 DIAGNOSIS — D485 Neoplasm of uncertain behavior of skin: Secondary | ICD-10-CM | POA: Diagnosis not present

## 2020-01-09 DIAGNOSIS — D0461 Carcinoma in situ of skin of right upper limb, including shoulder: Secondary | ICD-10-CM | POA: Diagnosis not present

## 2020-01-09 DIAGNOSIS — S30861A Insect bite (nonvenomous) of abdominal wall, initial encounter: Secondary | ICD-10-CM | POA: Diagnosis not present

## 2020-01-09 MED ORDER — CLOBETASOL 0.05 % TOPICAL OINTMENT
0 days
Start: 2020-01-09 — End: ?

## 2020-01-18 DIAGNOSIS — D0461 Carcinoma in situ of skin of right upper limb, including shoulder: Secondary | ICD-10-CM | POA: Diagnosis not present

## 2020-01-24 ENCOUNTER — Other Ambulatory Visit: Payer: Self-pay

## 2020-01-24 ENCOUNTER — Inpatient Hospital Stay: Payer: Medicare Other | Attending: Hematology and Oncology

## 2020-01-24 DIAGNOSIS — D45 Polycythemia vera: Secondary | ICD-10-CM | POA: Insufficient documentation

## 2020-01-24 DIAGNOSIS — D500 Iron deficiency anemia secondary to blood loss (chronic): Secondary | ICD-10-CM

## 2020-01-24 LAB — COMPREHENSIVE METABOLIC PANEL
ALT: 14 U/L (ref 0–44)
AST: 52 U/L — ABNORMAL HIGH (ref 15–41)
Albumin: 3.5 g/dL (ref 3.5–5.0)
Alkaline Phosphatase: 95 U/L (ref 38–126)
Anion gap: 7 (ref 5–15)
BUN: 41 mg/dL — ABNORMAL HIGH (ref 8–23)
CO2: 24 mmol/L (ref 22–32)
Calcium: 8.6 mg/dL — ABNORMAL LOW (ref 8.9–10.3)
Chloride: 100 mmol/L (ref 98–111)
Creatinine, Ser: 2.24 mg/dL — ABNORMAL HIGH (ref 0.61–1.24)
GFR calc Af Amer: 33 mL/min — ABNORMAL LOW (ref >60)
GFR calc non Af Amer: 28 mL/min — ABNORMAL LOW (ref >60)
Glucose, Bld: 91 mg/dL (ref 70–99)
Potassium: 4.4 mmol/L (ref 3.5–5.1)
Sodium: 131 mmol/L — ABNORMAL LOW (ref 135–145)
Total Bilirubin: 0.8 mg/dL (ref 0.3–1.2)
Total Protein: 8.7 g/dL — ABNORMAL HIGH (ref 6.5–8.1)

## 2020-01-24 LAB — CBC WITH DIFFERENTIAL/PLATELET
Abs Immature Granulocytes: 2 10*3/uL — ABNORMAL HIGH (ref 0.00–0.07)
Band Neutrophils: 9 %
Basophils Absolute: 0.9 10*3/uL — ABNORMAL HIGH (ref 0.0–0.1)
Basophils Relative: 3 %
Blasts: 5 %
Eosinophils Absolute: 0 10*3/uL (ref 0.0–0.5)
Eosinophils Relative: 0 %
HCT: 32.5 % — ABNORMAL LOW (ref 39.0–52.0)
Hemoglobin: 9.9 g/dL — ABNORMAL LOW (ref 13.0–17.0)
Lymphocytes Relative: 10 %
Lymphs Abs: 2.9 10*3/uL (ref 0.7–4.0)
MCH: 24.6 pg — ABNORMAL LOW (ref 26.0–34.0)
MCHC: 30.5 g/dL (ref 30.0–36.0)
MCV: 80.8 fL (ref 80.0–100.0)
Metamyelocytes Relative: 4 %
Monocytes Absolute: 1.7 10*3/uL — ABNORMAL HIGH (ref 0.1–1.0)
Monocytes Relative: 6 %
Myelocytes: 3 %
Neutro Abs: 19.9 10*3/uL — ABNORMAL HIGH (ref 1.7–7.7)
Neutrophils Relative %: 60 %
Platelets: 358 10*3/uL (ref 150–400)
RBC: 4.02 MIL/uL — ABNORMAL LOW (ref 4.22–5.81)
RDW: 26.5 % — ABNORMAL HIGH (ref 11.5–15.5)
Smear Review: NORMAL
WBC: 28.9 10*3/uL — ABNORMAL HIGH (ref 4.0–10.5)
nRBC: 0.9 % — ABNORMAL HIGH (ref 0.0–0.2)
nRBC: 2 /100 WBC — ABNORMAL HIGH

## 2020-01-24 LAB — IRON AND TIBC
Iron: 78 ug/dL (ref 45–182)
Saturation Ratios: 37 % (ref 17.9–39.5)
TIBC: 213 ug/dL — ABNORMAL LOW (ref 250–450)
UIBC: 135 ug/dL

## 2020-01-24 LAB — FERRITIN: Ferritin: 175 ng/mL (ref 24–336)

## 2020-01-24 LAB — LACTATE DEHYDROGENASE: LDH: 910 U/L — ABNORMAL HIGH (ref 98–192)

## 2020-01-27 ENCOUNTER — Encounter: Payer: Self-pay | Admitting: Family Medicine

## 2020-01-27 DIAGNOSIS — N1330 Unspecified hydronephrosis: Secondary | ICD-10-CM | POA: Diagnosis not present

## 2020-01-27 DIAGNOSIS — N1832 Chronic kidney disease, stage 3b: Secondary | ICD-10-CM | POA: Diagnosis not present

## 2020-01-27 DIAGNOSIS — E872 Acidosis: Secondary | ICD-10-CM | POA: Diagnosis not present

## 2020-01-27 DIAGNOSIS — N2581 Secondary hyperparathyroidism of renal origin: Secondary | ICD-10-CM | POA: Diagnosis not present

## 2020-01-27 DIAGNOSIS — R809 Proteinuria, unspecified: Secondary | ICD-10-CM | POA: Diagnosis not present

## 2020-01-30 NOTE — Unmapped (Signed)
[facility] Refill Coordination Note    Specialty Medication(s) to be Shipped:   Hematology/Oncology: Inbrebic    Other medication(s) to be shipped: n/a     Margaret Pyle, DOB: 06/29/1946  Phone: [phone] (home)       All above HIPAA information was verified with patient.     Was a Nurse, learning disability used for this call? No    Completed refill call assessment today to schedule patient's medication shipment from the [facility] [phone]).       Specialty medication(s) and dose(s) confirmed: Regimen is correct and unchanged.   Changes to medications: Ferron reports no changes at this time.  Changes to insurance: No  Questions for the pharmacist: No    Confirmed patient received Welcome Packet with first shipment. The patient will receive a drug information handout for each medication shipped and additional FDA Medication Guides as required.       DISEASE/MEDICATION-SPECIFIC INFORMATION        N/A    SPECIALTY MEDICATION ADHERENCE     Medication Adherence    Patient reported X missed doses in the last month: 0  Specialty Medication: Inrebic 100mg   Patient is on additional specialty medications: No  Informant: patient                Inrebic 100 mg: 7 days of medicine on hand          SHIPPING     Shipping address confirmed in Epic.     Delivery Scheduled: Yes, Expected medication delivery date: 02/01/20.     Medication will be delivered via Same Day Courier to the prescription address in Epic [place].    Wyatt Mage M Elisabeth Cara   [facility] Specialty Technician

## 2020-02-01 MED FILL — INREBIC 100 MG CAPSULE: 30 days supply | Qty: 120 | Fill #1 | Status: AC

## 2020-02-01 MED FILL — INREBIC 100 MG CAPSULE: ORAL | 30 days supply | Qty: 120 | Fill #1

## 2020-02-15 NOTE — Progress Notes (Signed)
[facility]  [address], [address] [place],  [address] Phone: [phone]  Fax: [phone]   Clinic Day:  02/22/2020  Referring physician: [name], MD  Chief Complaint: Ruben [name] is a 74 y.o. 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   HPI: The patient was last seen in the hematology clinic on 12/27/2019. At that time, he was doing well. He denied any nausea, vomiting or diarrhea. He was eating well and snacking throughout the day. He had been off oral iron x 1 month. Hematocrit was 36.6, hemoglobin 10.8, MCV 75.9, platelets 303,000, WBC 27,900, ANC 27035. LDH was 432. Ferritin was 166.   Peripheral smear revealed adequate platelets.  RBCs were microcytic with an occasional nucleated RBC.  There was neutrophilic leukocytosis with myelocytes and promyelocytes.  The blast count appeared to be between 5-10%.  Flow cytometry revealed granulocytosis with left shifted maturation and decreased side scatter properties, circulating aberrant myeloblasts (5% of leukocytes) and basophilia.  25% of T cells had loss of CD7 expression.  Labs followed: 01/24/2020 ([facility]): Hematocrit 32.5, hemoglobin 9.9, MCV 80.8, platelets 358,000, WBC 28900, ANC 00938. Ferritin was 175 with an iron saturation of 37% and TIBC of 213. LDH was 910.  01/27/2020 ([facility]):  Hematocrit 33.9, hemoglobin 10.2, MCV 81.5, platelets 351,000, WBC 31600.  He was seen by Dr. Holley Raring on 01/27/2020 for follow up on his chronic kidney disease stage III, hydronephrosis, secondary hyperparathyroidism, metabolic acidosis, and proteinuria in the setting of polycythemia vera. CKD appeared to be stable with most recent EGFR 39 ml/min. He has underlying bilateral hydronephrosis and continued to perform self intermittent catheterization usually 5 times per day. Overall, he was doing well.  Follow-up is scheduled in 4 months.   Symptomatically, he feels good  today. His right eye is bruised. His wife says it happened about a week ago when he rubbed his eye harshly. He suspects is could be allergies. He was bitten by a bunch of ticks since his last visit. His dermatologist gave him medication to relieve the itching. He says he misses being on jJkafi because it used to relieve the pain in his feet. His memory has not been as good as it used to be. He sometimes has a hard time understanding what is being said in conversations. His wife says she has to wait up to a minute sometimes for him to answer her questions because he becomes forgetful. He has had a dry cough. He has seasonal allergies. He has been easily fatigued. He denies any fever or sweats.    Past Medical History:  Diagnosis Date  . Blood dyscrasia   . BPH (benign prostatic hyperplasia)   . Cancer ([place])    SKIN/ POLYCYTHEMIA VERA  . Chronic kidney disease    RENAL INSUFF (40%)  . Crohn disease ([place])   . Crohn's disease ([place])   . Glaucoma   . Glaucoma   . Gout   . History of chicken pox   . Myelofibrosis ([place])   . Nosebleed   . RBBB   . Right bundle branch block     Past Surgical History:  Procedure Laterality Date  . CATARACT EXTRACTION W/PHACO Left 07/13/2018   Procedure: CATARACT EXTRACTION PHACO AND INTRAOCULAR LENS PLACEMENT (IOC);  Surgeon: Ruben Robson, MD;  Location: [facility] ORS;  Service: Ophthalmology;  Laterality: Left;  [place] 00:39  CDE 5.14 Fluid pack lot # [id number]  . CATARACT EXTRACTION W/PHACO Right 08/03/2018  Procedure: CATARACT EXTRACTION PHACO AND INTRAOCULAR LENS PLACEMENT (IOC);  Surgeon: Ruben Robson, MD;  Location: [facility] ORS;  Service: Ophthalmology;  Laterality: Right;  [place] 00:43.4 CDE 5.23 Fluid pack Lot # [id number]  . COLONOSCOPY WITH PROPOFOL N/A 03/27/2017   Procedure: COLONOSCOPY WITH PROPOFOL;  Surgeon: [name], MD;  Location: [facility] ENDOSCOPY;  Service: Endoscopy;  Laterality: N/A;  . MOHS SURGERY     EAR  . PROSTATE SURGERY  2008    Prostate Biopsy in [place] to elevated PSA.  per patient normal  . Skin Lesion Basal cell removed    . wart removal     from eyelid    Family History  Problem Relation Age of Onset  . Cancer Mother        Liver  . Cancer Father        Prostate  . Cancer Sister        Hodgkins lymphoma  . Cancer Paternal Aunt        Breast    Social History:  reports that he quit smoking about 16 years ago. His smoking use included cigarettes. He has a 5.00 pack-year smoking history. He has never used smokeless tobacco. He reports current alcohol use of about 4.0 - 5.0 standard drinks of alcohol per week. He reports that he does not use drugs. The patientpreviously worked for [employer]. He no longer volunteers for [employer]. The patient's wife isJudith([phone]).He lives in [place]. The patient is accompanied by his wife today.  Allergies: No Known Allergies  Current Medications: Current Outpatient Medications  Medication Sig Dispense Refill  . acetaminophen (TYLENOL) 500 MG tablet Take 500 mg by mouth daily as needed for moderate pain.    Marland Kitchen allopurinol (ZYLOPRIM) 300 MG tablet TAKE 1/2 TABLET (150 MG TOTAL) BY MOUTH DAILY. 45 tablet 1  . Cholecalciferol (VITAMIN D3) 1000 units CAPS Take 1,000 Units by mouth daily at 12 noon.     . Cyanocobalamin (RA VITAMIN B-12 TR) 1000 MCG TBCR Take 1,000 mcg by mouth daily at 12 noon.     . Fedratinib HCl (INREBIC) 100 MG CAPS Take 4 tablets by mouth daily.     . Fedratinib HCl (INREBIC) 100 MG CAPS Take by mouth.    . finasteride (PROSCAR) 5 MG tablet Take 1 tablet (5 mg total) by mouth every evening. 90 tablet 3  . folic acid (FOLVITE) 1 MG tablet Take 1 mg by mouth daily at 12 noon.     . latanoprost (XALATAN) 0.005 % ophthalmic solution Place 1 drop into both eyes at bedtime.     . Multiple Vitamins-Minerals (CENTRUM SILVER ULTRA MENS) TABS Take 1 tablet by mouth daily at 12 noon.     . Omega-3 Fatty Acids (KP FISH OIL) 1200 MG CAPS  Take 1,200 mg by mouth daily.     . sodium bicarbonate 650 MG tablet Take 650 mg by mouth 2 (two) times daily.    . timolol (TIMOPTIC) 0.5 % ophthalmic solution Place 1 drop into both eyes daily. In AM  5  . ferrous sulfate 325 (65 FE) MG tablet Take 325 mg by mouth 4 (four) times a week. 5 x weekly     No current facility-administered medications for this visit.    Review of Systems  Constitutional: Positive for malaise/fatigue and weight loss (6 lb). Negative for chills, diaphoresis and fever.       Feels "good".  HENT: Negative for congestion and sore throat.   Eyes: Negative for blurred  vision and double vision.       Uses reading glasses. Right eye is bruised.   Respiratory: Positive for cough (chronic dry). Negative for shortness of breath.   Cardiovascular: Negative for chest pain, palpitations and leg swelling.  Gastrointestinal: Negative for constipation, diarrhea (mild; comes and  goes. ), heartburn, nausea and vomiting.       Crohn's (no symptoms). Eating well and snacking more. Drinking Gatorade.   Genitourinary: Negative for dysuria, frequency and urgency.  Musculoskeletal: Negative for back pain, falls and myalgias.  Skin: Negative for itching and rash.  Neurological: Negative for dizziness, sensory change, weakness and headaches.  Endo/Heme/Allergies: Positive for environmental allergies (seasonal). Bruises/bleeds easily.  Psychiatric/Behavioral: Positive for memory loss (mild). Negative for depression. The patient is not nervous/anxious.    Performance status (ECOG): 1 - Symptomatic but completely ambulatory  Vitals Blood pressure 127/70, pulse 73, temperature (!) 95 F (35 C), temperature source Tympanic, resp. rate 16, weight 157 lb 14.4 oz (71.6 kg), SpO2 100 %.   Physical Exam Vitals and nursing note reviewed.  Constitutional:      General: He is not in acute distress.    Appearance: Normal appearance. He is well-developed and well-nourished. He is not  diaphoretic.     Interventions: Face mask in place.  HENT:     Head: Normocephalic and atraumatic.     Mouth/Throat:     Mouth: Oropharynx is clear and moist and mucous membranes are normal. No oral lesions.      Comments: Gray hair. Eyes:     General: No scleral icterus.       Right eye: No discharge.        Left eye: No discharge.     Extraocular Movements: EOM normal.     Conjunctiva/sclera: Conjunctivae normal.     Pupils: Pupils are equal, round, and reactive to light.     Comments: Glasses. Blue eyes.  Slight bruising lateral to right eye.  Neck:     Vascular: No JVD.  Cardiovascular:     Rate and Rhythm: Normal rate and regular rhythm.     Pulses: Intact distal pulses.     Heart sounds: Normal heart sounds. No murmur heard.  No friction rub. No gallop.   Pulmonary:     Effort: Pulmonary effort is normal. No respiratory distress.     Breath sounds: Normal breath sounds. No wheezing, rhonchi or rales.  Chest:     Chest wall: No tenderness.  Abdominal:     General: Bowel sounds are normal. There is no distension.     Palpations: Abdomen is soft. There is splenomegaly. There is no hepatomegaly or mass.     Tenderness: There is no abdominal tenderness. There is no CVA tenderness, guarding or rebound.     Comments: Spleen is 3 FB below the left costal margin.   Musculoskeletal:        General: No tenderness or edema. Normal range of motion.     Cervical back: Normal range of motion and neck supple.  Lymphadenopathy:     Head:     Right side of head: No preauricular, posterior auricular or occipital adenopathy.     Left side of head: No preauricular, posterior auricular or occipital adenopathy.     Cervical: No cervical adenopathy.     Upper Body:  No axillary adenopathy present.    Right upper body: No supraclavicular adenopathy.     Left upper body: No supraclavicular adenopathy.     Lower Body:  No right inguinal adenopathy. No left inguinal adenopathy.  Skin:     General: Skin is warm, dry and intact.     Coloration: Skin is not pale.     Findings: No bruising, erythema, lesion or rash.  Neurological:     Mental Status: He is alert and oriented to person, place, and time.  Psychiatric:        Mood and Affect: Mood and affect normal.        Behavior: Behavior normal.        Thought Content: Thought content normal.        Judgment: Judgment normal.    Appointment on 02/22/2020  Component Date Value Ref Range Status  . Uric Acid, Serum 02/22/2020 6.7  3.7 - 8.6 mg/dL Final   Performed at [facility], [address], [place], [place] [address]  . LDH 02/22/2020 934* 98 - 192 U/L Final   Performed at [facility], [address] [place], [place] [address]  . Sodium 02/22/2020 133* 135 - 145 mmol/L Final  . Potassium 02/22/2020 4.4  3.5 - 5.1 mmol/L Final  . Chloride 02/22/2020 103  98 - 111 mmol/L Final  . CO2 02/22/2020 22  22 - 32 mmol/L Final  . Glucose, Bld 02/22/2020 92  70 - 99 mg/dL Final   Glucose reference range applies only to samples taken after fasting for at least 8 hours.  . BUN 02/22/2020 47* 8 - 23 mg/dL Final  . Creatinine, Ser 02/22/2020 2.38* 0.61 - 1.24 mg/dL Final  . Calcium 02/22/2020 8.5* 8.9 - 10.3 mg/dL Final  . Total Protein 02/22/2020 8.8* 6.5 - 8.1 g/dL Final  . Albumin 02/22/2020 3.5  3.5 - 5.0 g/dL Final  . AST 02/22/2020 54* 15 - 41 U/L Final  . ALT 02/22/2020 13  0 - 44 U/L Final  . Alkaline Phosphatase 02/22/2020 95  38 - 126 U/L Final  . Total Bilirubin 02/22/2020 0.7  0.3 - 1.2 mg/dL Final  . GFR calc non Af Amer 02/22/2020 26* >60 mL/min Final  . GFR calc Af Amer 02/22/2020 30* >60 mL/min Final  . Anion gap 02/22/2020 8  5 - 15 Final   Performed at [facility], [address], [place], [place] [address]  . WBC 02/22/2020 34.5* 4.0 - 10.5 K/uL Final  . RBC 02/22/2020 3.44* 4.22 - 5.81 MIL/uL Final  . Hemoglobin 02/22/2020 9.1* 13.0 - 17.0 g/dL Final  . HCT  02/22/2020 29.3* 39.0 - 52.0 % Final  . MCV 02/22/2020 85.2  80.0 - 100.0 fL Final  . MCH 02/22/2020 26.5  26.0 - 34.0 pg Final  . MCHC 02/22/2020 31.1  30.0 - 36.0 g/dL Final  . RDW 02/22/2020 23.7* 11.5 - 15.5 % Final  . Platelets 02/22/2020 343  150 - 400 K/uL Final  . nRBC 02/22/2020 0.9* 0.0 - 0.2 % Final   Performed at [facility], [address], [place], [place] [address]  . Neutrophils Relative % 02/22/2020 PENDING  % Incomplete  . Neutro Abs 02/22/2020 PENDING  1.7 - 7.7 K/uL Incomplete  . Band Neutrophils 02/22/2020 PENDING  % Incomplete  . Lymphocytes Relative 02/22/2020 PENDING  % Incomplete  . Lymphs Abs 02/22/2020 PENDING  0.7 - 4.0 K/uL Incomplete  . Monocytes Relative 02/22/2020 PENDING  % Incomplete  . Monocytes Absolute 02/22/2020 PENDING  0.1 - 1.0 K/uL Incomplete  . Eosinophils Relative 02/22/2020 PENDING  % Incomplete  . Eosinophils Absolute 02/22/2020 PENDING  0.0 - 0.5 K/uL Incomplete  . Basophils Relative 02/22/2020 PENDING  % Incomplete  . Basophils Absolute 02/22/2020 PENDING  0.0 - 0.1 K/uL Incomplete  . WBC Morphology 02/22/2020 PENDING   Incomplete  . RBC Morphology 02/22/2020 PENDING   Incomplete  . Smear Review 02/22/2020 PENDING   Incomplete  . Other 02/22/2020 PENDING  % Incomplete  . nRBC 02/22/2020 PENDING  0 /100 WBC Incomplete  . Metamyelocytes Relative 02/22/2020 PENDING  % Incomplete  . Myelocytes 02/22/2020 PENDING  % Incomplete  . Promyelocytes Relative 02/22/2020 PENDING  % Incomplete  . Blasts 02/22/2020 PENDING  % Incomplete  . Immature Granulocytes 02/22/2020 PENDING  % Incomplete  . Abs Immature Granulocytes 02/22/2020 PENDING  0.00 - 0.07 K/uL Incomplete    Assessment:  KAHLE MCQUEEN is a 74 y.o. 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 He has had polycythemia dating back to 2013. Hematocrit was 62.1 with a hemoglobin of 19.8 on 03/28/2015. 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 Erythropoietin level was  1.1 (low). 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  He began a phlebotomyprogram on 03/28/2015 to maintain a hematocrit goal of <45.Last phlebotomywason 05/02/2015. He is on a baby aspirin.  CBC on 11/28/2015 revealed a hematocrit of 39.3, hemoglobin 11.9, platelets 463,000, WBC 32,000 with an ANC of 25,000. Differential included 71% segs, 4% lymphocytes and 17% monocytes. Peripheral smear revealed leukocytosis with predominantly mature neutrophils, increased monocytes and rare blasts (<1%).  Bone marrowon 12/14/2015 revealed a persistent myeloproliferative neoplasm with myelofibrosis and alterations compatible with myelodysplatic progression. Marrow was packed (95-100% cellularity) with pan myelosis, multi-lineage dyspoiesis, and no significant increase in blasts. There was moderate to focally marked reticulin fibrosis (grade 2-3/3). Storage iron was not identified. Flow cytometry revealed non-specific atypical myeloid findings with no increase in blasts. 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 Cytogenetics were normal (46, XY).  Bone marrowon 04/07/2016 at [facility] revealed a hypercellular marrow (>95%) with persistent involvement by myeloid neoplasm with 5% blasts. There was mild reticulin fibrosis. FISH t(9;22) results were normal. 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 Cytogenetics are pending.  Bone marrowon 08/09/2018 revealed a hypercellular bone marrow (>95%) with persistent involvement by myeloid neoplasm with <1% blasts by manual touch preparation differential. There was marked reticulin fibrosis with focal collagen deposition. Cytogenetics were normal (46, XY). - Myeloid mutation panel study is pending.  Bone marrowon 07/14/2019 revealed a hypercellular marrow (>95%) with persistent involvement by a myeloid neoplasm with  extensive fibrosis and rare (< 5%) CD34 +blast byIHC. Myeloid mutation panel revealed the patient's previously reported variants inIDH2, JAK2, KRAS, NRAS and SRSF2 were again identified. There are no new variants. Cytogenetics are pending.  He was briefly on hydroxyurea500 mg a day (12/05/2015 - 12/19/2015 and 06/25/2016 - 07/03/2016). Platelet count increased to 1.18 million and white count to 54,800 without increased blasts on 06/25/2016. CBC on 07/02/2016 revealed a platelets of 429,000 with a white count 30,600. He began allopurinolon 12/19/2015. He began Jakafi 20 mg BID on 05/28/2016. He has been onJakafi25 mg BID since09/12/2016.  LDHis followed: 466 on 01/02/2016, 596 on 05/23/2016, 663 on 07/30/2016, 522 on 09/24/2016, 638 on 01/05/2017, 625 on 02/25/2017, 1144 on 06/10/2017, 720 on 06/17/2017, 728 on 06/24/2017, 724 on 01/06/2018, 616 on 04/07/2018, 671 on 07/07/2018, 820 on 10/06/2018, 702 on 01/04/2019, 10821 on 04/06/2019, 686 on 06/02/2019, 888 on 08/24/2019, 656 on 11/01/2019, 432 and 12/27/2019 and 910 on 01/24/2020. Triglycerideswere  183 (<150) on 08/20/2016, 180 on 01/05/2017, 312 on 06/17/2017, 165 on 11/25/2017, 113 on 02/17/2018.  He has urinary retention. Renal ultrasoundon 07/28/2016 revealed bilateral hydronephrosis (right >left) and a large postvoid residual (2 liters) suggesting bladder dysfunction or outlet obstruction. He underwent temporary Foley catheter placement. He performs I/O self catheterizations. He is on Flomax and finasteride. PSAwas 2.02 on 07/09/2016.  He is followed by GI (Dr. Vira Agar) for a history of polypsand Crohn's disease.He had an unremarkable colonoscopy in 03/2017.AbdomenandpelvisCTon 04/03/2019 revealed mild wall thickening of portions of a loop of small bowel within the pelvis with intervening areas of mildly dilated small bowel,consistent with active Crohn's disease. No evidence of an abscess or fistula. No  other evidence of bowel inflammation.There was splenomegaly(16.7 x 7.7 x 15.6 cm), developed since12/11/2011.There was mild bladder wall thickeningandenlargement of the prostate. Chronic bladder outlet obstructionwas felt to bethe etiology of bladder wall thickening.  He has chronicrenal insufficiency(Cr 1.85; CrCl 36 ml/min). SPEP on 09/23/2016 was negative. Spot urine revealed 17.4% of 27.3 mg/dL of a monoclonal protein. 24 hour urine on 10/16/2016 revealed no monoclonal protein. Renal function transiently decreased in 06/2017 after Bactrim. 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  He is followed by nephrology and urology.  He has a history of epistaxis. Normal studies included: PT, PTT, von Willebrand panel, and platelet function assay (PFA).  He has right foot drop. 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 There was no non-contrast MRI evidence of brain metastasis. There was only mild for age nonspecific cerebral white signal changes. Lumbar spine MRIon 06/09/2019 showed diffusely abnormal marrow signal in the visible skeleton, as seen on the brain MRI. There was no pathologic fracture and no extraosseous extension of tumor on the lumbar spine or visible sacrum. In conjunction with the splenomegaly by CT in [place] infiltration byleukemia/lymphoma may be the most likely etiology. Differential considerations included multiple myeloma and metastatic disease unknown primary. The involvement of the pelvis should be amenable to bone marrow biopsy.There was superimposed mild for age lumbar spine degeneration with no spinal stenosis.There was mild degenerative L4 and L5 neural foraminal stenosis.  He received his firstCOVID-19vaccineon 10/28/2019.  Symptomatically, he is doing well.  He denies any fevers or sweats.  He has lost weight.  Plan: 1.   Labs today: CBC with diff, CMP, ferritin, LDH, uric acid, vitamin B1  (thiamine). 2.   Peripheral smear for path review. 3. Myeloproliferative neoplasm /Polycythemia rubra vera Clinically,he is doing fair on fedratinib. Hematocrit 29.3. Hemoglobin9.1. Platelets343,000. PNP00,511 ([id number]). Bone marrow on 07/14/2019 revealeda hypercellular marrow (>95%) with persistent involvement by a myeloid neoplasm with extensive fibrosis and <5% blasts.  Myeloid mutation panel revealed the patient's previously reported variants.  Review of peripheral smears with follow-up have been stable.  Flow cytometry on 12/27/2019 revealed 5% blasts.  He began fedratinibon 09/09/2019.  Discuss concern for progressive disease.  Review plan for bone marrow aspirate and biopsy to assess disease status. Several questions asked and answered. 4. Normocytic anemia Hematocrit29.3. Hemoglobin 9.1. MCV85.2. Ferritin 205 today. Patient off oral iron x 1 month.  He denies any bleeding.   Discuss concern for progressive marrow disease. 5.Hyponatremia [id number] Patient is euvolemic. Review interim note by Dr Holley Raring. 6.Renal insufficiency  Creatinine 2.38. Creatinine clearance 28.7 ml/min.  Discuss decreasing fedratinib from 400 mg a day to 200 mg a day as CrCl between 15-29 ml/min. 7.Schedule CT guided bone marrow aspirate and biopsy. 8.   RTC 10 days after bone marrow for MD  assessment, review of marrow, and discussion regarding direction of therapy.  Addendum:  Peripheral smear today reveals increased proportion of immature blasts, estimated at 7.5% of total by manual differential. There was peripheral basophilia. There were a few neutrophils displaying reduced nuclear lobation and cytoplasmic hypogranularity.  There was a normochromic, normocytic anemia with moderate  anisopoikilocytosis, including ovalocytes, few elliptocytes, and polychromasia. There was evidence of dyserythropoiesis, with nucleated RBCs showing rare binucleation.  There was a normal platelet count, with scattered giant platelets identified. There was no definite evidence of progression of disease.  I discussed the assessment and treatment plan with the patient.  The patient was provided an opportunity to ask questions and all were answered.  The patient agreed with the plan and demonstrated an understanding of the instructions.  The patient was advised to call back if the symptoms worsen or if the condition fails to improve as anticipated.  I provided 18 minutes (12:12 PM - 12:30 PM) of face-to-face time during this this encounter and > 50% was spent counseling as documented under my assessment and plan.  An additional 10-15 minutes were spent reviewing his chart (Epic and Care Everywhere) including notes, labs, pathology reports, and imaging studies.    Lequita Asal, MD, PhD    02/22/2020, 12:12 PM  I, Heywood Footman, am acting as Education administrator for [employer]. Mike Gip, MD, PhD.  I, Iceis Knab C. Mike Gip, MD, have reviewed the above documentation for accuracy and completeness, and I agree with the above.

## 2020-02-22 ENCOUNTER — Inpatient Hospital Stay: Payer: Medicare Other | Admitting: Oncology

## 2020-02-22 ENCOUNTER — Inpatient Hospital Stay: Payer: Medicare Other | Attending: Hematology and Oncology | Admitting: Hematology and Oncology

## 2020-02-22 ENCOUNTER — Other Ambulatory Visit: Payer: Self-pay | Admitting: Hematology and Oncology

## 2020-02-22 ENCOUNTER — Encounter: Payer: Self-pay | Admitting: Hematology and Oncology

## 2020-02-22 ENCOUNTER — Other Ambulatory Visit: Payer: Self-pay

## 2020-02-22 VITALS — BP 127/70 | HR 73 | Temp 95.0°F | Resp 16 | Wt 157.9 lb

## 2020-02-22 DIAGNOSIS — R634 Abnormal weight loss: Secondary | ICD-10-CM | POA: Diagnosis not present

## 2020-02-22 DIAGNOSIS — D649 Anemia, unspecified: Secondary | ICD-10-CM | POA: Insufficient documentation

## 2020-02-22 DIAGNOSIS — D45 Polycythemia vera: Secondary | ICD-10-CM | POA: Insufficient documentation

## 2020-02-22 DIAGNOSIS — E871 Hypo-osmolality and hyponatremia: Secondary | ICD-10-CM | POA: Diagnosis not present

## 2020-02-22 DIAGNOSIS — N183 Chronic kidney disease, stage 3 unspecified: Secondary | ICD-10-CM | POA: Diagnosis not present

## 2020-02-22 DIAGNOSIS — N289 Disorder of kidney and ureter, unspecified: Secondary | ICD-10-CM | POA: Insufficient documentation

## 2020-02-22 DIAGNOSIS — Z79899 Other long term (current) drug therapy: Secondary | ICD-10-CM | POA: Insufficient documentation

## 2020-02-22 DIAGNOSIS — E872 Acidosis: Secondary | ICD-10-CM | POA: Insufficient documentation

## 2020-02-22 DIAGNOSIS — Z8041 Family history of malignant neoplasm of ovary: Secondary | ICD-10-CM | POA: Diagnosis not present

## 2020-02-22 DIAGNOSIS — D471 Chronic myeloproliferative disease: Secondary | ICD-10-CM | POA: Diagnosis not present

## 2020-02-22 DIAGNOSIS — Z87891 Personal history of nicotine dependence: Secondary | ICD-10-CM | POA: Insufficient documentation

## 2020-02-22 DIAGNOSIS — N1330 Unspecified hydronephrosis: Secondary | ICD-10-CM | POA: Insufficient documentation

## 2020-02-22 DIAGNOSIS — Z800 Family history of malignant neoplasm of digestive organs: Secondary | ICD-10-CM | POA: Insufficient documentation

## 2020-02-22 DIAGNOSIS — D500 Iron deficiency anemia secondary to blood loss (chronic): Secondary | ICD-10-CM

## 2020-02-22 DIAGNOSIS — Z806 Family history of leukemia: Secondary | ICD-10-CM | POA: Insufficient documentation

## 2020-02-22 DIAGNOSIS — C9200 Acute myeloblastic leukemia, not having achieved remission: Secondary | ICD-10-CM | POA: Insufficient documentation

## 2020-02-22 DIAGNOSIS — R161 Splenomegaly, not elsewhere classified: Secondary | ICD-10-CM | POA: Insufficient documentation

## 2020-02-22 DIAGNOSIS — M21371 Foot drop, right foot: Secondary | ICD-10-CM | POA: Diagnosis not present

## 2020-02-22 DIAGNOSIS — N401 Enlarged prostate with lower urinary tract symptoms: Secondary | ICD-10-CM | POA: Insufficient documentation

## 2020-02-22 DIAGNOSIS — J189 Pneumonia, unspecified organism: Secondary | ICD-10-CM | POA: Diagnosis not present

## 2020-02-22 DIAGNOSIS — J90 Pleural effusion, not elsewhere classified: Secondary | ICD-10-CM | POA: Insufficient documentation

## 2020-02-22 DIAGNOSIS — Z7189 Other specified counseling: Secondary | ICD-10-CM

## 2020-02-22 DIAGNOSIS — E213 Hyperparathyroidism, unspecified: Secondary | ICD-10-CM | POA: Insufficient documentation

## 2020-02-22 DIAGNOSIS — Z86010 Personal history of colonic polyps: Secondary | ICD-10-CM | POA: Insufficient documentation

## 2020-02-22 DIAGNOSIS — K5090 Crohn's disease, unspecified, without complications: Secondary | ICD-10-CM | POA: Diagnosis not present

## 2020-02-22 DIAGNOSIS — I129 Hypertensive chronic kidney disease with stage 1 through stage 4 chronic kidney disease, or unspecified chronic kidney disease: Secondary | ICD-10-CM | POA: Diagnosis not present

## 2020-02-22 DIAGNOSIS — D7581 Myelofibrosis: Secondary | ICD-10-CM | POA: Diagnosis not present

## 2020-02-22 DIAGNOSIS — Z8042 Family history of malignant neoplasm of prostate: Secondary | ICD-10-CM | POA: Insufficient documentation

## 2020-02-22 LAB — COMPREHENSIVE METABOLIC PANEL WITH GFR
ALT: 13 U/L (ref 0–44)
AST: 54 U/L — ABNORMAL HIGH (ref 15–41)
Albumin: 3.5 g/dL (ref 3.5–5.0)
Alkaline Phosphatase: 95 U/L (ref 38–126)
Anion gap: 8 (ref 5–15)
BUN: 47 mg/dL — ABNORMAL HIGH (ref 8–23)
CO2: 22 mmol/L (ref 22–32)
Calcium: 8.5 mg/dL — ABNORMAL LOW (ref 8.9–10.3)
Chloride: 103 mmol/L (ref 98–111)
Creatinine, Ser: 2.38 mg/dL — ABNORMAL HIGH (ref 0.61–1.24)
GFR calc Af Amer: 30 mL/min — ABNORMAL LOW
GFR calc non Af Amer: 26 mL/min — ABNORMAL LOW
Glucose, Bld: 92 mg/dL (ref 70–99)
Potassium: 4.4 mmol/L (ref 3.5–5.1)
Sodium: 133 mmol/L — ABNORMAL LOW (ref 135–145)
Total Bilirubin: 0.7 mg/dL (ref 0.3–1.2)
Total Protein: 8.8 g/dL — ABNORMAL HIGH (ref 6.5–8.1)

## 2020-02-22 LAB — CBC WITH DIFFERENTIAL/PLATELET
Abs Immature Granulocytes: 4.5 10*3/uL — ABNORMAL HIGH (ref 0.00–0.07)
Band Neutrophils: 13 %
Basophils Absolute: 0.7 10*3/uL — ABNORMAL HIGH (ref 0.0–0.1)
Basophils Relative: 2 %
Eosinophils Absolute: 0 10*3/uL (ref 0.0–0.5)
Eosinophils Relative: 0 %
HCT: 29.3 % — ABNORMAL LOW (ref 39.0–52.0)
Hemoglobin: 9.1 g/dL — ABNORMAL LOW (ref 13.0–17.0)
Lymphocytes Relative: 7 %
Lymphs Abs: 2.4 10*3/uL (ref 0.7–4.0)
MCH: 26.5 pg (ref 26.0–34.0)
MCHC: 31.1 g/dL (ref 30.0–36.0)
MCV: 85.2 fL (ref 80.0–100.0)
Metamyelocytes Relative: 7 %
Monocytes Absolute: 5.5 10*3/uL — ABNORMAL HIGH (ref 0.1–1.0)
Monocytes Relative: 16 %
Myelocytes: 4 %
Neutro Abs: 16.6 10*3/uL — ABNORMAL HIGH (ref 1.7–7.7)
Neutrophils Relative %: 35 %
Other: 14 %
Platelets: 343 10*3/uL (ref 150–400)
Promyelocytes Relative: 2 %
RBC Morphology: NONE SEEN
RBC: 3.44 MIL/uL — ABNORMAL LOW (ref 4.22–5.81)
RDW: 23.7 % — ABNORMAL HIGH (ref 11.5–15.5)
WBC: 34.5 10*3/uL — ABNORMAL HIGH (ref 4.0–10.5)
nRBC: 0.9 % — ABNORMAL HIGH (ref 0.0–0.2)
nRBC: 2 /100 WBC — ABNORMAL HIGH

## 2020-02-22 LAB — URIC ACID: Uric Acid, Serum: 6.7 mg/dL (ref 3.7–8.6)

## 2020-02-22 LAB — FERRITIN: Ferritin: 205 ng/mL (ref 24–336)

## 2020-02-22 LAB — LACTATE DEHYDROGENASE: LDH: 934 U/L — ABNORMAL HIGH (ref 98–192)

## 2020-02-22 NOTE — Progress Notes (Signed)
Patient here for oncology follow-up appointment, expresses concerns of dry cough not improving and ankle pain.

## 2020-02-23 LAB — PATHOLOGIST SMEAR REVIEW

## 2020-02-24 ENCOUNTER — Other Ambulatory Visit: Payer: Self-pay | Admitting: Radiology

## 2020-02-24 NOTE — Unmapped (Signed)
Patient reports dose has been decreased to Inrebic 2 capsules daily.  Pt has FU next week.  Dinh Ayotte to FU on 03/01/20.  03/01/20: ADDENDUM: no appt for patient this week.  Sent in-basket msg to provider/CPP Sheh-Li to provide new script with current directions to [facility].  No Epic documentation of dose change.   03/08/20: ADDENDUM: Inrebic discontinued. Patient to be started on new medication.  [facility] will onboard patient once new medication received and routed to pharmacist.    [name], PharmD  [facility]

## 2020-02-27 ENCOUNTER — Other Ambulatory Visit: Payer: Self-pay | Admitting: Radiology

## 2020-02-28 ENCOUNTER — Encounter: Admit: 2020-02-28 | Discharge: 2020-02-28 | Payer: MEDICARE | Attending: Internal Medicine | Primary: Internal Medicine

## 2020-02-28 ENCOUNTER — Other Ambulatory Visit: Payer: Self-pay

## 2020-02-28 ENCOUNTER — Ambulatory Visit
Admission: RE | Admit: 2020-02-28 | Discharge: 2020-02-28 | Disposition: A | Discharge status: Home / Self Care | Payer: Medicare Other | Source: Ambulatory Visit | Attending: Hematology and Oncology | Admitting: Hematology and Oncology

## 2020-02-28 DIAGNOSIS — N189 Chronic kidney disease, unspecified: Secondary | ICD-10-CM | POA: Diagnosis not present

## 2020-02-28 DIAGNOSIS — K5090 Crohn's disease, unspecified, without complications: Secondary | ICD-10-CM | POA: Diagnosis not present

## 2020-02-28 DIAGNOSIS — H409 Unspecified glaucoma: Secondary | ICD-10-CM | POA: Insufficient documentation

## 2020-02-28 DIAGNOSIS — M109 Gout, unspecified: Secondary | ICD-10-CM | POA: Insufficient documentation

## 2020-02-28 DIAGNOSIS — N289 Disorder of kidney and ureter, unspecified: Secondary | ICD-10-CM | POA: Diagnosis not present

## 2020-02-28 DIAGNOSIS — R011 Cardiac murmur, unspecified: Secondary | ICD-10-CM | POA: Insufficient documentation

## 2020-02-28 DIAGNOSIS — Z79899 Other long term (current) drug therapy: Secondary | ICD-10-CM | POA: Insufficient documentation

## 2020-02-28 DIAGNOSIS — D751 Secondary polycythemia: Secondary | ICD-10-CM | POA: Diagnosis not present

## 2020-02-28 DIAGNOSIS — J189 Pneumonia, unspecified organism: Secondary | ICD-10-CM | POA: Diagnosis not present

## 2020-02-28 DIAGNOSIS — D45 Polycythemia vera: Secondary | ICD-10-CM | POA: Insufficient documentation

## 2020-02-28 DIAGNOSIS — D4989 Neoplasm of unspecified behavior of other specified sites: Secondary | ICD-10-CM | POA: Diagnosis not present

## 2020-02-28 DIAGNOSIS — R05 Cough: Secondary | ICD-10-CM | POA: Insufficient documentation

## 2020-02-28 DIAGNOSIS — Z87891 Personal history of nicotine dependence: Secondary | ICD-10-CM | POA: Insufficient documentation

## 2020-02-28 DIAGNOSIS — C9200 Acute myeloblastic leukemia, not having achieved remission: Secondary | ICD-10-CM | POA: Insufficient documentation

## 2020-02-28 DIAGNOSIS — R5383 Other fatigue: Secondary | ICD-10-CM | POA: Diagnosis not present

## 2020-02-28 DIAGNOSIS — E871 Hypo-osmolality and hyponatremia: Secondary | ICD-10-CM | POA: Diagnosis not present

## 2020-02-28 DIAGNOSIS — D649 Anemia, unspecified: Secondary | ICD-10-CM | POA: Diagnosis not present

## 2020-02-28 LAB — CBC WITH DIFFERENTIAL/PLATELET
Abs Immature Granulocytes: 2.7 10*3/uL — ABNORMAL HIGH (ref 0.00–0.07)
Band Neutrophils: 3 %
Basophils Absolute: 0.7 10*3/uL — ABNORMAL HIGH (ref 0.0–0.1)
Basophils Relative: 2 %
Eosinophils Absolute: 0 10*3/uL (ref 0.0–0.5)
Eosinophils Relative: 0 %
HCT: 29.2 % — ABNORMAL LOW (ref 39.0–52.0)
Hemoglobin: 9 g/dL — ABNORMAL LOW (ref 13.0–17.0)
Lymphocytes Relative: 10 %
Lymphs Abs: 3.4 10*3/uL (ref 0.7–4.0)
MCH: 26.4 pg (ref 26.0–34.0)
MCHC: 30.8 g/dL (ref 30.0–36.0)
MCV: 85.6 fL (ref 80.0–100.0)
Metamyelocytes Relative: 3 %
Monocytes Absolute: 0.7 10*3/uL (ref 0.1–1.0)
Monocytes Relative: 2 %
Myelocytes: 5 %
Neutro Abs: 20.2 10*3/uL — ABNORMAL HIGH (ref 1.7–7.7)
Neutrophils Relative %: 57 %
Other: 18 %
Platelets: 338 10*3/uL (ref 150–400)
RBC: 3.41 MIL/uL — ABNORMAL LOW (ref 4.22–5.81)
RDW: 22.8 % — ABNORMAL HIGH (ref 11.5–15.5)
Smear Review: NORMAL
WBC: 33.6 10*3/uL — ABNORMAL HIGH (ref 4.0–10.5)
nRBC: 1.5 % — ABNORMAL HIGH (ref 0.0–0.2)

## 2020-02-28 LAB — VITAMIN B1: Vitamin B1 (Thiamine): 246.8 nmol/L — ABNORMAL HIGH (ref 66.5–200.0)

## 2020-02-28 LAB — PATHOLOGIST SMEAR REVIEW

## 2020-02-28 MED ORDER — FENTANYL CITRATE (PF) 100 MCG/2ML IJ SOLN
INTRAMUSCULAR | Status: DC | PRN
  Administered 2020-02-28 (×2): 50 ug via INTRAVENOUS

## 2020-02-28 MED ORDER — FENTANYL CITRATE (PF) 100 MCG/2ML IJ SOLN
INTRAMUSCULAR | Status: AC
  Filled 2020-02-28: qty 2

## 2020-02-28 MED ORDER — SODIUM CHLORIDE 0.9 % IV SOLN: INTRAVENOUS | Status: DC

## 2020-02-28 MED ORDER — MIDAZOLAM HCL 2 MG/2ML IJ SOLN
INTRAMUSCULAR | Status: DC | PRN
  Administered 2020-02-28 (×2): 1 mg via INTRAVENOUS

## 2020-02-28 MED ORDER — HEPARIN SOD (PORK) LOCK FLUSH 100 UNIT/ML IV SOLN
INTRAVENOUS | Status: AC
  Filled 2020-02-28: qty 5

## 2020-02-28 MED ORDER — MIDAZOLAM HCL 2 MG/2ML IJ SOLN
INTRAMUSCULAR | Status: AC
  Filled 2020-02-28: qty 2

## 2020-02-28 NOTE — Procedures (Signed)
Pre-procedure Diagnosis: Polycythemia Vera Post-procedure Diagnosis: Same  Technically successful CT guided bone marrow aspiration and biopsy of left iliac crest.   Complications: None Immediate  EBL: None  Signed: Sandi Mariscal Pager: [phone] 02/28/2020, 9:23 AM

## 2020-02-28 NOTE — Discharge Instructions (Signed)
Bone Marrow Aspiration and Bone Marrow Biopsy, Adult, Care After This sheet gives you information about how to care for yourself after your procedure. Your health care provider may also give you more specific instructions. If you have problems or questions, contact your health care provider. What can I expect after the procedure? After the procedure, it is common to have:  Mild pain and tenderness.  Swelling.  Bruising. Follow these instructions at home: Puncture site care   Follow instructions from your health care provider about how to take care of the puncture site. Make sure you: ? Wash your hands with soap and water before and after you change your bandage (dressing). If soap and water are not available, use hand sanitizer. ? Change your dressing as told by your health care provider.  Check your puncture site every day for signs of infection. Check for: ? More redness, swelling, or pain. ? Fluid or blood. ? Warmth. ? Pus or a bad smell. Activity  Return to your normal activities as told by your health care provider. Ask your health care provider what activities are safe for you.  Do not lift anything that is heavier than 10 lb (4.5 kg), or the limit that you are told, until your health care provider says that it is safe.  Do not drive for 24 hours if you were given a sedative during your procedure. General instructions   Take over-the-counter and prescription medicines only as told by your health care provider.  Do not take baths, swim, or use a hot tub until your health care provider approves. Ask your health care provider if you may take showers. You may only be allowed to take sponge baths.  If directed, put ice on the affected area. To do this: ? Put ice in a plastic bag. ? Place a towel between your skin and the bag. ? Leave the ice on for 20 minutes, 2-3 times a day.  Keep all follow-up visits as told by your health care provider. This is important. Contact a  health care provider if:  Your pain is not controlled with medicine.  You have a fever.  You have more redness, swelling, or pain around the puncture site.  You have fluid or blood coming from the puncture site.  Your puncture site feels warm to the touch.  You have pus or a bad smell coming from the puncture site. Summary  After the procedure, it is common to have mild pain, tenderness, swelling, and bruising.  Follow instructions from your health care provider about how to take care of the puncture site and what activities are safe for you.  Take over-the-counter and prescription medicines only as told by your health care provider.  Contact a health care provider if you have any signs of infection, such as fluid or blood coming from the puncture site. This information is not intended to replace advice given to you by your health care provider. Make sure you discuss any questions you have with your health care provider. Document Revised: 01/25/2019 Document Reviewed: 01/25/2019 Elsevier Patient Education  2020 Else[employer] Inc. Moderate Conscious Sedation, Adult, Care After These instructions provide you with information about caring for yourself after your procedure. Your health care provider may also give you more specific instructions. Your treatment has been planned according to current medical practices, but problems sometimes occur. Call your health care provider if you have any problems or questions after your procedure. What can I expect after the procedure? After your procedure,   it is common:  To feel sleepy for several hours.  To feel clumsy and have poor balance for several hours.  To have poor judgment for several hours.  To vomit if you eat too soon. Follow these instructions at home: For at least 24 hours after the procedure:   Do not: ? Participate in activities where you could fall or become injured. ? Drive. ? Use heavy machinery. ? Drink alcohol. ? Take  sleeping pills or medicines that cause drowsiness. ? Make important decisions or sign legal documents. ? Take care of children on your own.  Rest. Eating and drinking  Follow the diet recommended by your health care provider.  If you vomit: ? Drink water, juice, or soup when you can drink without vomiting. ? Make sure you have little or no nausea before eating solid foods. General instructions  Have a responsible adult stay with you until you are awake and alert.  Take over-the-counter and prescription medicines only as told by your health care provider.  If you smoke, do not smoke without supervision.  Keep all follow-up visits as told by your health care provider. This is important. Contact a health care provider if:  You keep feeling nauseous or you keep vomiting.  You feel light-headed.  You develop a rash.  You have a fever. Get help right away if:  You have trouble breathing. This information is not intended to replace advice given to you by your health care provider. Make sure you discuss any questions you have with your health care provider. Document Revised: 08/21/2017 Document Reviewed: 12/29/2015 Elsevier Patient Education  2020 Elsevier Inc.  

## 2020-02-28 NOTE — Progress Notes (Signed)
Patient clinically stable post BMB per Dr Pascal Lux, denies complaints at this time. 2x2 dressing dry/intact to posterior sacral area. Update given to wife with questions answered. Awake/alert and oriented post procedure. Report given to [place] along with [place] in specials with questions answered post procedure.

## 2020-02-28 NOTE — Consult Note (Signed)
Chief Complaint: Polycythemia rubra vera  Referring Physician(s): Woden C  Patient Status: [facility] - Out-pt  History of Present Illness: Ruben [name] is a 74 y.o. male with past medical history significant for Crohn's disease, right bundle branch block, BPH and polycythemia vera/myelofibrosis who presents today for CT-guided bone marrow biopsy for tissue diagnostic purposes.  Patient previously underwent CT-guided bone marrow biopsy on 12/14/2015 and therefore is familiar with the procedure.  Patient continues to complain of persistent nonproductive cough and fatigue.  He denies fever or chills.  He is otherwise without complaint.  Past Medical History:  Diagnosis Date  . Blood dyscrasia   . BPH (benign prostatic hyperplasia)   . Cancer ([place])    SKIN/ POLYCYTHEMIA VERA  . Chronic kidney disease    RENAL INSUFF (40%)  . Crohn disease ([place])   . Crohn's disease ([place])   . Glaucoma   . Glaucoma   . Gout   . History of chicken pox   . Myelofibrosis ([place])   . Nosebleed   . RBBB   . Right bundle branch block     Past Surgical History:  Procedure Laterality Date  . CATARACT EXTRACTION W/PHACO Left 07/13/2018   Procedure: CATARACT EXTRACTION PHACO AND INTRAOCULAR LENS PLACEMENT (IOC);  Surgeon: [name], MD;  Location: [facility] ORS;  Service: Ophthalmology;  Laterality: Left;  [place] 00:39  CDE 5.14 Fluid pack lot # [id number]  . CATARACT EXTRACTION W/PHACO Right 08/03/2018   Procedure: CATARACT EXTRACTION PHACO AND INTRAOCULAR LENS PLACEMENT (IOC);  Surgeon: [name], MD;  Location: [facility] ORS;  Service: Ophthalmology;  Laterality: Right;  [place] 00:43.4 CDE 5.23 Fluid pack Lot # [id number]  . COLONOSCOPY WITH PROPOFOL N/A 03/27/2017   Procedure: COLONOSCOPY WITH PROPOFOL;  Surgeon: [name], MD;  Location: [facility] ENDOSCOPY;  Service: Endoscopy;  Laterality: N/A;  . MOHS SURGERY     EAR  . PROSTATE SURGERY  2008   Prostate Biopsy in [place] to  elevated PSA.  per patient normal  . Skin Lesion Basal cell removed    . wart removal     from eyelid    Allergies: Patient has no known allergies.  Medications: Prior to Admission medications   Medication Sig Start Date End Date Taking? Authorizing Provider  acetaminophen (TYLENOL) 500 MG tablet Take 500 mg by mouth daily as needed for moderate pain.   Yes [provider]  allopurinol (ZYLOPRIM) 300 MG tablet TAKE 1/2 TABLET (150 MG TOTAL) BY MOUTH DAILY. 12/05/19  Yes Lequita Asal, MD  Cholecalciferol (VITAMIN D3) 1000 units CAPS Take 1,000 Units by mouth daily at 12 noon.    Yes [provider]  Cyanocobalamin (RA VITAMIN B-12 TR) 1000 MCG TBCR Take 1,000 mcg by mouth daily at 12 noon.    Yes [provider]  Fedratinib HCl (INREBIC) 100 MG CAPS Take 200 mg by mouth daily.  01/02/20  Yes [provider]  ferrous sulfate 325 (65 FE) MG tablet Take 325 mg by mouth 4 (four) times a week. 5 x weekly   Yes [provider]  finasteride (PROSCAR) 5 MG tablet Take 1 tablet (5 mg total) by mouth every evening. 11/08/19  Yes Hollice Espy, MD  folic acid (FOLVITE) 1 MG tablet Take 1 mg by mouth daily at 12 noon.  03/20/15  Yes [provider]  latanoprost (XALATAN) 0.005 % ophthalmic solution Place 1 drop into both eyes at bedtime.    Yes [provider]  Multiple Vitamins-Minerals (CENTRUM  SILVER ULTRA MENS) TABS Take 1 tablet by mouth daily at 12 noon.    Yes [provider]  Omega-3 Fatty Acids (KP FISH OIL) 1200 MG CAPS Take 1,200 mg by mouth daily.    Yes [provider]  sodium bicarbonate 650 MG tablet Take 650 mg by mouth 2 (two) times daily. 06/06/17  Yes [provider]  timolol (TIMOPTIC) 0.5 % ophthalmic solution Place 1 drop into both eyes daily. In AM 05/27/18  Yes [provider]     Family History  Problem Relation Age of Onset  . Cancer Mother        Liver  . Cancer Father         Prostate  . Cancer Sister        Hodgkins lymphoma  . Cancer Paternal Aunt        Breast    Social History   Socioeconomic History  . Marital status: Married    Spouse name: Not on file  . Number of children: 2  . Years of education: Not on file  . Highest education level: Bachelor's degree (e.g., BA, AB, BS)  Occupational History  . Occupation: Retired  Tobacco Use  . Smoking status: Former Smoker    Packs/day: 0.25    Years: 20.00    Pack years: 5.00    Types: Cigarettes    Quit date: 09/28/2003    Years since quitting: 16.4  . Smokeless tobacco: Never Used  Substance and Sexual Activity  . Alcohol use: Yes    Alcohol/week: 4.0 - 5.0 standard drinks    Types: 4 - 5 Glasses of wine per week  . Drug use: No  . Sexual activity: Not on file  Other Topics Concern  . Not on file  Social History Narrative  . Not on file   Social Determinants of Health   Financial Resource Strain:   . Difficulty of Paying Living Expenses:   Food Insecurity:   . Worried About Charity fundraiser in the Last Year:   . Arboriculturist in the Last Year:   Transportation Needs:   . Film/video editor (Medical):   Marland Kitchen Lack of Transportation (Non-Medical):   Physical Activity: Inactive  . Days of Exercise per Week: 0 days  . Minutes of Exercise per Session: 0 min  Stress:   . Feeling of Stress :   Social Connections: Unknown  . Frequency of Communication with Friends and Family: Patient refused  . Frequency of Social Gatherings with Friends and Family: Patient refused  . Attends Religious Services: Patient refused  . Active Member of Clubs or Organizations: Patient refused  . Attends Archivist Meetings: Patient refused  . Marital Status: Patient refused    ECOG Status: 1 - Symptomatic but completely ambulatory  Review of Systems: A 12 point ROS discussed and pertinent positives are indicated in the HPI above.  All other systems are negative.  Review of Systems    Constitutional: Positive for fatigue. Negative for activity change and fever.  Respiratory: Positive for cough.     Vital Signs: BP 132/69   Pulse 92   Temp 98.7 F (37.1 C) (Oral)   Resp 19   Ht 6' (1.829 m)   Wt 72.6 kg   SpO2 98%   BMI 21.70 kg/m   Physical Exam Vitals and nursing note reviewed.  Constitutional:      Appearance: Normal appearance. He is normal weight.  Cardiovascular:  Rate and Rhythm: Regular rhythm.     Heart sounds: Murmur present.     Comments: Questionable holosystolic murmur, not previously documented Pulmonary:     Breath sounds: Wheezing present.  Neurological:     Mental Status: He is alert.  Psychiatric:        Mood and Affect: Mood normal.        Behavior: Behavior normal.        Thought Content: Thought content normal.        Judgment: Judgment normal.     Imaging: No results found.  Labs:  CBC: Recent Labs    11/29/19 0948 12/27/19 1013 01/24/20 1059 02/22/20 1029  WBC 30.7* 27.9* 28.9* 34.5*  HGB 12.2* 10.8* 9.9* 9.1*  HCT 43.1 36.6* 32.5* 29.3*  PLT 283 303 358 343    COAGS: No results for input(s): INR, APTT in the last 8760 hours.  BMP: Recent Labs    11/29/19 0948 12/27/19 1013 01/24/20 1059 02/22/20 1029  NA 131* 132* 131* 133*  K 4.4 4.6 4.4 4.4  CL 100 100 100 103  CO2 21* _0 GLUCOSE 78 88 91 92  BUN 29* 46* 41* 47*  CALCIUM 8.8* 8.8* 8.6* 8.5*  CREATININE 1.94* 2.23* 2.24* 2.38*  GFRNONAA 34* 28* 28* 26*  GFRAA 39* 33* 33* 30*    LIVER FUNCTION TESTS: Recent Labs    11/01/19 1003 12/27/19 1013 01/24/20 1059 02/22/20 1029  BILITOT 0.8 0.6 0.8 0.7  AST 45* 54* 52* 54*  ALT _1 ALKPHOS 91 100 95 95  PROT 8.8* 9.1* 8.7* 8.8*  ALBUMIN 3.5 3.6 3.5 3.5    TUMOR MARKERS: No results for input(s): AFPTM, CEA, CA199, CHROMGRNA in the last 8760 hours.  Assessment and Plan:  Ruben [name] is a 74 y.o. male with past medical history significant for Crohn's disease, right  bundle branch block, BPH and polycythemia vera/myelofibrosis who presents today for CT-guided bone marrow biopsy for tissue diagnostic purposes.  Patient previously underwent CT-guided bone marrow biopsy on 12/14/2015 and therefore is familiar with the procedure.  Risks and benefits of CT guided BM Bx was discussed with the patient and/or patient's family including, but not limited to bleeding, infection, damage to adjacent structures or low yield requiring additional tests.  All of the questions were answered and there is agreement to proceed.  Consent signed and in chart.  Note, questioned holosystolic murmur auscultated on today's physical examination has not been previously documented and as such, will relay this potential finding to Dr. Mike Gip.   Thank you for this interesting consult.  I greatly enjoyed meeting Ruben [name] and look forward to participating in their care.  A copy of this report was sent to the requesting provider on this date.  Electronically Signed: Sandi Mariscal, MD 02/28/2020, 8:15 AM   I spent a total of 15 Minutes in face to face in clinical consultation, greater than 50% of which was counseling/coordinating care for CT-guided bone marrow biopsy and aspiration.

## 2020-02-29 ENCOUNTER — Encounter: Payer: Self-pay | Admitting: Oncology

## 2020-02-29 ENCOUNTER — Inpatient Hospital Stay: Payer: Medicare Other

## 2020-02-29 ENCOUNTER — Ambulatory Visit: Payer: Medicare Other

## 2020-02-29 ENCOUNTER — Inpatient Hospital Stay (HOSPITAL_BASED_OUTPATIENT_CLINIC_OR_DEPARTMENT_OTHER): Payer: Medicare Other | Admitting: Oncology

## 2020-02-29 ENCOUNTER — Ambulatory Visit
Admission: RE | Admit: 2020-02-29 | Discharge: 2020-02-29 | Disposition: A | Discharge status: Home / Self Care | Payer: Medicare Other | Source: Ambulatory Visit | Attending: Oncology | Admitting: Oncology

## 2020-02-29 ENCOUNTER — Other Ambulatory Visit: Payer: Self-pay | Admitting: Oncology

## 2020-02-29 VITALS — BP 131/70 | HR 88 | Temp 99.9°F | Resp 20 | Wt 164.6 lb

## 2020-02-29 DIAGNOSIS — R05 Cough: Secondary | ICD-10-CM

## 2020-02-29 DIAGNOSIS — R059 Cough, unspecified: Secondary | ICD-10-CM

## 2020-02-29 DIAGNOSIS — D649 Anemia, unspecified: Secondary | ICD-10-CM | POA: Diagnosis not present

## 2020-02-29 DIAGNOSIS — R0602 Shortness of breath: Secondary | ICD-10-CM | POA: Diagnosis not present

## 2020-02-29 DIAGNOSIS — C9200 Acute myeloblastic leukemia, not having achieved remission: Secondary | ICD-10-CM | POA: Diagnosis not present

## 2020-02-29 DIAGNOSIS — J189 Pneumonia, unspecified organism: Secondary | ICD-10-CM | POA: Diagnosis not present

## 2020-02-29 DIAGNOSIS — N289 Disorder of kidney and ureter, unspecified: Secondary | ICD-10-CM | POA: Diagnosis not present

## 2020-02-29 DIAGNOSIS — J069 Acute upper respiratory infection, unspecified: Secondary | ICD-10-CM

## 2020-02-29 DIAGNOSIS — Z148 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: Secondary | ICD-10-CM

## 2020-02-29 DIAGNOSIS — E871 Hypo-osmolality and hyponatremia: Secondary | ICD-10-CM | POA: Diagnosis not present

## 2020-02-29 DIAGNOSIS — D45 Polycythemia vera: Secondary | ICD-10-CM | POA: Diagnosis not present

## 2020-02-29 LAB — CBC WITH DIFFERENTIAL/PLATELET
Abs Immature Granulocytes: 5.6 10*3/uL — ABNORMAL HIGH (ref 0.00–0.07)
Band Neutrophils: 9 %
Basophils Absolute: 3.9 10*3/uL — ABNORMAL HIGH (ref 0.0–0.1)
Basophils Relative: 9 %
Blasts: 8 %
Eosinophils Absolute: 0.4 10*3/uL (ref 0.0–0.5)
Eosinophils Relative: 1 %
HCT: 30.2 % — ABNORMAL LOW (ref 39.0–52.0)
Hemoglobin: 9.5 g/dL — ABNORMAL LOW (ref 13.0–17.0)
Lymphocytes Relative: 7 %
Lymphs Abs: 3 10*3/uL (ref 0.7–4.0)
MCH: 26.6 pg (ref 26.0–34.0)
MCHC: 31.5 g/dL (ref 30.0–36.0)
MCV: 84.6 fL (ref 80.0–100.0)
Metamyelocytes Relative: 9 %
Monocytes Absolute: 1.3 10*3/uL — ABNORMAL HIGH (ref 0.1–1.0)
Monocytes Relative: 3 %
Myelocytes: 4 %
Neutro Abs: 25.6 10*3/uL — ABNORMAL HIGH (ref 1.7–7.7)
Neutrophils Relative %: 50 %
Platelets: 404 10*3/uL — ABNORMAL HIGH (ref 150–400)
RBC: 3.57 MIL/uL — ABNORMAL LOW (ref 4.22–5.81)
RDW: 22.7 % — ABNORMAL HIGH (ref 11.5–15.5)
Smear Review: ADEQUATE
WBC: 43.4 10*3/uL — ABNORMAL HIGH (ref 4.0–10.5)
nRBC: 2.2 % — ABNORMAL HIGH (ref 0.0–0.2)

## 2020-02-29 LAB — COMPREHENSIVE METABOLIC PANEL
ALT: 14 U/L (ref 0–44)
AST: 51 U/L — ABNORMAL HIGH (ref 15–41)
Albumin: 3.4 g/dL — ABNORMAL LOW (ref 3.5–5.0)
Alkaline Phosphatase: 108 U/L (ref 38–126)
Anion gap: 9 (ref 5–15)
BUN: 35 mg/dL — ABNORMAL HIGH (ref 8–23)
CO2: 23 mmol/L (ref 22–32)
Calcium: 8.7 mg/dL — ABNORMAL LOW (ref 8.9–10.3)
Chloride: 102 mmol/L (ref 98–111)
Creatinine, Ser: 2.04 mg/dL — ABNORMAL HIGH (ref 0.61–1.24)
GFR calc Af Amer: 37 mL/min — ABNORMAL LOW (ref >60)
GFR calc non Af Amer: 32 mL/min — ABNORMAL LOW (ref >60)
Glucose, Bld: 93 mg/dL (ref 70–99)
Potassium: 4.7 mmol/L (ref 3.5–5.1)
Sodium: 134 mmol/L — ABNORMAL LOW (ref 135–145)
Total Bilirubin: 0.8 mg/dL (ref 0.3–1.2)
Total Protein: 8.9 g/dL — ABNORMAL HIGH (ref 6.5–8.1)

## 2020-02-29 MED ORDER — ALBUTEROL SULFATE HFA 90 MCG/ACTUATION AEROSOL INHALER
RESPIRATORY_TRACT | 0 days
Start: 2020-02-29 — End: 2020-03-06

## 2020-02-29 MED ORDER — AMOXICILLIN 875 MG-POTASSIUM CLAVULANATE 125 MG TABLET
ORAL | 0 days
Start: 2020-02-29 — End: ?

## 2020-02-29 MED ORDER — AZITHROMYCIN 250 MG TABLET
0 days
Start: 2020-02-29 — End: 2020-03-06

## 2020-02-29 MED ORDER — AMOXICILLIN-POT CLAVULANATE 875-125 MG PO TABS: 1.0000 | ORAL_TABLET | Freq: Two times a day (BID) | ORAL | 0 refills | Status: DC

## 2020-02-29 MED ORDER — ALBUTEROL SULFATE HFA 108 (90 BASE) MCG/ACT IN AERS
2.0000 | INHALATION_SPRAY | Freq: Four times a day (QID) | RESPIRATORY_TRACT | 0 refills | Status: DC | PRN
Start: 2020-02-29 — End: 2020-04-30

## 2020-02-29 MED ORDER — AZITHROMYCIN 250 MG PO TABS: ORAL_TABLET | ORAL | 0 refills | Status: DC

## 2020-02-29 NOTE — Progress Notes (Signed)
Symptom Management Consult note [facility]  [phone] [phone]  Patient Care Team: [name], MD as PCP - General (Family Medicine) [name], MD (Inactive) (Gastroenterology) Dingeldein, Remo Lipps, MD (Ophthalmology) Lequita Asal, MD as Referring Physician (Hematology and Oncology) Dasher, Rayvon Char, MD as Referring Physician (Dermatology) Anthonette Legato, MD as Consulting Physician (Internal Medicine) Hollice Espy, MD as Consulting Physician (Urology) Crissie Sickles, MD as Referring Physician (Hematology and Oncology) Vladimir Crofts, MD as Consulting Physician (Neurology)   Name of the patient: Ruben [name]  [id number]  1946/04/27   Date of visit: 02/29/2020   Diagnosis-polycythemia Joaquim Lai vera (PV)  Chief complaint/ Reason for visit- Cough   Heme/Onc history: Ruben [name] is a 74 year old male with past medical history significant for BPH, chronic kidney disease, Crohn's disease, glaucoma, gout, myelofibrosis, right bundle branch block and currently being treated for polycythemia vera.  Ruben [name] was started on fedratinib back in January 2021.  Has not required therapeutic phlebotomy since 2016.  Ruben [name] was last seen by Dr. Mike Gip on 12/27/2019 for follow-up.  Ruben [name] was doing well on fedratinib.  Hematocrit 32.5.  Ruben [name] was seen by Dr. Erlene Quan on 11/08/2019 for follow-up for urinary retention currently self cathing himself approximately 5 times daily.  No signs of infection.  Declined outlet surgery.  Ruben [name] has received both COVID-19 vaccinations as of March 2021.  Patient was seen by Dr. Pascal Lux yesterday for CT-guided bone marrow aspiration of left iliac crest.  Interval history-Ruben [name] presents to symptom management today for complaints of a chronic cough that has been getting worse over the past 10 days.  Patient notes cough began when Ruben [name] initiated his oral chemotherapy.  Ruben [name] recently started taking Tussionex DM at bedtime  to help with the cough at night.  Ruben [name] spoke with Clearnce Sorrel, oral chemotherapy pharmacist who offered otc cough medicine recommendations.  Appears to be helping.  Denies a fever.  Has intermittent shortness of breath mainly with exertion.  Feels that Ruben [name] on occasion has to take deep breaths when speaking due to shortness of breath.  Cough is nonproductive.  Ruben [name] denies any additional symptoms.  ECOG FS:1 - Symptomatic but completely ambulatory  Review of systems- Review of Systems  Constitutional: Negative.  Negative for chills, fever, malaise/fatigue and weight loss.  HENT: Negative for congestion, ear pain and tinnitus.   Eyes: Negative.  Negative for blurred vision and double vision.  Respiratory: Positive for cough and shortness of breath. Negative for sputum production.   Cardiovascular: Negative.  Negative for chest pain, palpitations and leg swelling.  Gastrointestinal: Negative.  Negative for abdominal pain, constipation, diarrhea, nausea and vomiting.  Genitourinary: Negative for dysuria, frequency and urgency.  Musculoskeletal: Negative for back pain and falls.  Skin: Negative.  Negative for rash.  Neurological: Negative.  Negative for weakness and headaches.  Endo/Heme/Allergies: Negative.  Does not bruise/bleed easily.  Psychiatric/Behavioral: Negative.  Negative for depression. The patient is not nervous/anxious and does not have insomnia.      Current treatment- Fedratinib 200 mg daily  No Known Allergies   Past Medical History:  Diagnosis Date  . Blood dyscrasia   . BPH (benign prostatic hyperplasia)   . Cancer ([place])    SKIN/ POLYCYTHEMIA VERA  . Chronic kidney disease    RENAL INSUFF (40%)  . Crohn disease ([place])   . Crohn's disease ([place])   . Glaucoma   . Glaucoma   . Gout   . History of chicken pox   .  Myelofibrosis ([place])   . Nosebleed   . RBBB   . Right bundle branch block      Past Surgical History:  Procedure Laterality Date  . CATARACT EXTRACTION W/PHACO  Left 07/13/2018   Procedure: CATARACT EXTRACTION PHACO AND INTRAOCULAR LENS PLACEMENT (IOC);  Surgeon: [name], MD;  Location: [facility] ORS;  Service: Ophthalmology;  Laterality: Left;  [place] 00:39  CDE 5.14 Fluid pack lot # [id number]  . CATARACT EXTRACTION W/PHACO Right 08/03/2018   Procedure: CATARACT EXTRACTION PHACO AND INTRAOCULAR LENS PLACEMENT (IOC);  Surgeon: [name], MD;  Location: [facility] ORS;  Service: Ophthalmology;  Laterality: Right;  [place] 00:43.4 CDE 5.23 Fluid pack Lot # [id number]  . COLONOSCOPY WITH PROPOFOL N/A 03/27/2017   Procedure: COLONOSCOPY WITH PROPOFOL;  Surgeon: [name], MD;  Location[facility] ENDOSCOPY;  Service: Endoscopy;  Laterality: N/A;  . MOHS SURGERY     EAR  . PROSTATE SURGERY  2008   Prostate Biopsy in [place] to elevated PSA.  per patient normal  . Skin Lesion Basal cell removed    . wart removal     from eyelid    Social History   Socioeconomic History  . Marital status: Married    Spouse name: Not on file  . Number of children: 2  . Years of education: Not on file  . Highest education level: Bachelor's degree (e.g., BA, AB, BS)  Occupational History  . Occupation: Retired  Tobacco Use  . Smoking status: Former Smoker    Packs/day: 0.25    Years: 20.00    Pack years: 5.00    Types: Cigarettes    Quit date: 09/28/2003    Years since quitting: 16.4  . Smokeless tobacco: Never Used  Substance and Sexual Activity  . Alcohol use: Yes    Alcohol/week: 4.0 - 5.0 standard drinks    Types: 4 - 5 Glasses of wine per week  . Drug use: No  . Sexual activity: Not on file  Other Topics Concern  . Not on file  Social History Narrative  . Not on file   Social Determinants of Health   Financial Resource Strain:   . Difficulty of Paying Living Expenses:   Food Insecurity:   . Worried About Charity fundraiser in the Last Year:   . Arboriculturist in the Last Year:   Transportation Needs:   . Film/video editor (Medical):    Marland Kitchen Lack of Transportation (Non-Medical):   Physical Activity:   . Days of Exercise per Week:   . Minutes of Exercise per Session:   Stress:   . Feeling of Stress :   Social Connections:   . Frequency of Communication with Friends and Family:   . Frequency of Social Gatherings with Friends and Family:   . Attends Religious Services:   . Active Member of Clubs or Organizations:   . Attends Archivist Meetings:   Marland Kitchen Marital Status:   Intimate Partner Violence:   . Fear of Current or Ex-Partner:   . Emotionally Abused:   Marland Kitchen Physically Abused:   . Sexually Abused:     Family History  Problem Relation Age of Onset  . Cancer Mother        Liver  . Cancer Father        Prostate  . Cancer Sister        Hodgkins lymphoma  . Cancer Paternal Aunt        Breast  Current Outpatient Medications:  .  acetaminophen (TYLENOL) 500 MG tablet, Take 500 mg by mouth daily as needed for moderate pain., Disp: , Rfl:  .  albuterol (VENTOLIN HFA) 108 (90 Base) MCG/ACT inhaler, Inhale 2 puffs into the lungs every 6 (six) hours as needed for wheezing or shortness of breath., Disp: 18 g, Rfl: 0 .  allopurinol (ZYLOPRIM) 300 MG tablet, TAKE 1/2 TABLET (150 MG TOTAL) BY MOUTH DAILY., Disp: 45 tablet, Rfl: 1 .  amoxicillin-clavulanate (AUGMENTIN) 875-125 MG tablet, Take 1 tablet by mouth 2 (two) times daily., Disp: 20 tablet, Rfl: 0 .  azithromycin (ZITHROMAX) 250 MG tablet, Take 500 mg (2 tabs) day 1 and 250 mg (1 tab) days 2-5., Disp: 6 each, Rfl: 0 .  Cholecalciferol (VITAMIN D3) 1000 units CAPS, Take 1,000 Units by mouth daily at 12 noon. , Disp: , Rfl:  .  Cyanocobalamin (RA VITAMIN B-12 TR) 1000 MCG TBCR, Take 1,000 mcg by mouth daily at 12 noon. , Disp: , Rfl:  .  Fedratinib HCl (INREBIC) 100 MG CAPS, Take 200 mg by mouth daily. , Disp: , Rfl:  .  finasteride (PROSCAR) 5 MG tablet, Take 1 tablet (5 mg total) by mouth every evening., Disp: 90 tablet, Rfl: 3 .  folic acid (FOLVITE) 1 MG  tablet, Take 1 mg by mouth daily at 12 noon. , Disp: , Rfl:  .  latanoprost (XALATAN) 0.005 % ophthalmic solution, Place 1 drop into both eyes at bedtime. , Disp: , Rfl:  .  Multiple Vitamins-Minerals (CENTRUM SILVER ULTRA MENS) TABS, Take 1 tablet by mouth daily at 12 noon. , Disp: , Rfl:  .  Omega-3 Fatty Acids (KP FISH OIL) 1200 MG CAPS, Take 1,200 mg by mouth daily. , Disp: , Rfl:  .  sodium bicarbonate 650 MG tablet, Take 650 mg by mouth 2 (two) times daily., Disp: , Rfl:  .  timolol (TIMOPTIC) 0.5 % ophthalmic solution, Place 1 drop into both eyes daily. In AM, Disp: , Rfl: 5  Physical exam:  Vitals:   02/29/20 1211  BP: 131/70  Pulse: 88  Resp: 20  Temp: 99.9 F (37.7 C)  TempSrc: Tympanic  SpO2: 100%  Weight: 164 lb 9.6 oz (74.7 kg)   Physical Exam Constitutional:      Appearance: Normal appearance.  HENT:     Head: Normocephalic and atraumatic.  Eyes:     Pupils: Pupils are equal, round, and reactive to light.  Cardiovascular:     Rate and Rhythm: Normal rate and regular rhythm.     Heart sounds: Normal heart sounds. No murmur.  Pulmonary:     Effort: Pulmonary effort is normal.     Breath sounds: Normal breath sounds. No wheezing.  Abdominal:     General: Bowel sounds are normal. There is no distension.     Palpations: Abdomen is soft.     Tenderness: There is no abdominal tenderness.  Musculoskeletal:        General: Normal range of motion.     Cervical back: Normal range of motion.  Skin:    General: Skin is warm and dry.     Findings: No rash.  Neurological:     Mental Status: Ruben [name] is alert and oriented to person, place, and time.  Psychiatric:        Judgment: Judgment normal.      CMP Latest Ref Rng & Units 02/29/2020  Glucose 70 - 99 mg/dL 93  BUN 8 - 23 mg/dL 35(H)  Creatinine  0.61 - 1.24 mg/dL 2.04(H)  Sodium 135 - 145 mmol/L 134(L)  Potassium 3.5 - 5.1 mmol/L 4.7  Chloride 98 - 111 mmol/L 102  CO2 22 - 32 mmol/L 23  Calcium 8.9 - 10.3 mg/dL  8.7(L)  Total Protein 6.5 - 8.1 g/dL 8.9(H)  Total Bilirubin 0.3 - 1.2 mg/dL 0.8  Alkaline Phos 38 - 126 U/L 108  AST 15 - 41 U/L 51(H)  ALT 0 - 44 U/L 14   CBC Latest Ref Rng & Units 02/29/2020  WBC 4.0 - 10.5 K/uL 43.4(H)  Hemoglobin 13.0 - 17.0 g/dL 9.5(L)  Hematocrit 39.0 - 52.0 % 30.2(L)  Platelets 150 - 400 K/uL 404(H)    No images are attached to the encounter.  DG Chest 2 View  Result Date: 02/29/2020 CLINICAL DATA:  Abdominal pain. Cough and shortness of breath for 2 months. EXAM: CHEST - 2 VIEW COMPARISON:  None. FINDINGS: Heart size is. Bilateral perihilar airspace opacities are present right greater than left. Small effusions are also present. Axial skeleton is within limits. IMPRESSION: 1. Bilateral perihilar airspace disease right greater than left. This is concerning for infection. Followup PA and lateral chest X-ray is recommended in 3-4 weeks following trial of antibiotic therapy to ensure resolution and exclude underlying malignancy. 2. Small bilateral pleural effusions. Electronically Signed   By: San Morelle M.D.   On: 02/29/2020 12:20   CT BONE MARROW BIOPSY & ASPIRATION  Result Date: 02/28/2020 INDICATION: History of Polycythemia Vera/myelofibrosis. Please perform CT-guided bone marrow biopsy for tissue diagnostic purposes. EXAM: CT-GUIDED BONE MARROW BIOPSY AND ASPIRATION MEDICATIONS: None ANESTHESIA/SEDATION: Fentanyl 100 mcg IV; Versed 2 mg IV Sedation Time: 12 Minutes; The patient was continuously monitored during the procedure by the interventional radiology nurse under my direct supervision. COMPLICATIONS: None immediate. PROCEDURE: Informed consent was obtained from the patient following an explanation of the procedure, risks, benefits and alternatives. The patient understands, agrees and consents for the procedure. All questions were addressed. A time out was performed prior to the initiation of the procedure. The patient was positioned prone and non-contrast  localization CT was performed of the pelvis to demonstrate the iliac marrow spaces. The operative site was prepped and draped in the usual sterile fashion. Under sterile conditions and local anesthesia, a 22 gauge spinal needle was utilized for procedural planning. Next, an 11 gauge coaxial bone biopsy needle was advanced into the left iliac marrow space. Needle position was confirmed with CT imaging. Initially, a bone marrow aspiration was attempted however no blood products were able to be aspirated despite appropriate needle positioning. As such, a bone marrow biopsy was obtained with the 11 gauge outer bone marrow device. The 11 gauge coaxial bone biopsy needle was re-advanced into a slightly different location within the left iliac marrow space, positioning was confirmed with CT imaging, however again, no aspirate was able to be obtained. As such, an additional bone marrow biopsy was obtained. Finally, given the lack adequate aspirate material, the 11 gauge coaxial bone biopsy needle was utilized to obtain a third separate bone marrow biopsy. The needle was removed and superficial hemostasis was obtained with manual compression. A dressing was applied. The patient tolerated the procedure well without immediate post procedural complication. IMPRESSION: Successful CT guided left iliac bone marrow core biopsy. Note, 3 core biopsies were obtained to improve diagnostic viability given lack of adequate aspirate despite appropriate needle positioning. Electronically Signed   By: Sandi Mariscal M.D.   On: 02/28/2020 09:30     Assessment  and plan- Patient is a 74 y.o. male who presents for chronic cough that is worsened over the past 10 days and intermittent shortness of breath mainly with exertion.  Ruben [name] is currently being treated for polycythemia vera with fedratinib.  Ruben [name] has had this cough for several months now.  Chest x-ray from today shows 5 lateral airspace disease right greater than left which is concerning for  infection.  Follow-up chest x-ray is recommended in 3 to 4 weeks following a trial of antibiotic therapy to ensure resolution and exclude underlying malignancy.  Lab work was fairly stable.  Mild leukocytosis and elevated creatinine (2.04) which is stable for him.  Spoke with Dr. Mike Gip who recommends antibiotics, COVID-19 testing and follow-up with her tomorrow.  Suspicion for Covid is very low given Ruben [name] has been vaccinated.  Plan: Labs. COVID-19 testing-orders placed Rx Augmentin x10 days Rx Z-Pak x5 days Rx albuterol inhaler as needed for shortness of breath  No interactions identified between Fedratinib, Augmentin and Z-Pak.  Disposition: Patient has follow-up next week with Dr. Mike Gip would like to see him sooner.   Visit Diagnosis 1. Cough     Patient expressed understanding and was in agreement with this plan. Ruben [name] also understands that Ruben [name] can call clinic at any time with any questions, concerns, or complaints.   Greater than 50% was spent in counseling and coordination of care with this patient including but not limited to discussion of the relevant topics above (See A&P) including, but not limited to diagnosis and management of acute and chronic medical conditions.   Thank you for allowing me to participate in the care of this very pleasant patient.    Jacquelin Hawking, NP [place] [facility] Cell - [phone] Pager- [phone] 02/29/2020 2:21 PM

## 2020-02-29 NOTE — Progress Notes (Signed)
[facility]  [address] [place],  [address] Phone: [phone]  Fax: [phone]   Clinic Day:  03/01/2020  Referring physician: [name], MD  Chief Complaint: Ruben [name] is a 74 y.o. 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  HPI: The patient was last seen in the hematology clinic on 02/22/2020. At that time, he felt "good".  He was easily fatigued and more forgetful.   He denied any fevers or sweats.  Hematocrit was 29.3, hemoglobin 9.1, platelets 343,000, WBC 34,500 (ANC 16,600). Ferritin was 205. Sodium was 133, creatinine 2.38 (CrCl 34.6 ml/min), calcium 8.5, total protein 8.8, AST 54.  Vitamin B1 was 246.8. Uric acid was 6.7. LDH was 934. We discussed follow-up bone marrow.  His fedratinib was reduced secondary to his renal insufficiency.  Peripheral smear on 02/22/2020 showed increased proportion of immature blasts, estimated at 7.5% of total by manual differential. There was peripheral basophilia. There were a few neutrophils displaying reduced nuclear lobation and cytoplasmic hypogranularity.  There was a normochromic, normocytic anemia with moderate anisopoikilocytosis, including ovalocytes, few elliptocytes, and polychromasia. There was evidence of dyserythropoiesis, with nucleated RBCs showing rare binucleation.  There was a normal platelet count, with scattered giant platelets identified. There was no definite evidence of progression of disease.  Patient underwent bone marrow biopsy and aspiration on 02/28/2020. Pathology is pending.  He was seen by Faythe Casa, NP on 02/29/2020 for a progressive non-productive cough x 10 days.  He was taking Tussionex.  He had intermittent shortness of breath with exertion.  CXR revealed bilateral perihilar airspace disease (right > left) concerning for infection. There was small bilateral pleural effusions.  He  was started on Augmentin x 10 days and azithromycin x 5 days.  He was also given a prescription for an albuterol MDI.  During the interim, he has been "ok". His cough has resolved.  He also reports that he is "not losing air" when he tries to speak anymore. He has not had a need for the albuterol.  The patient reports some pain at the site of his bone marrow biopsy on 02/28/2020. His appetite has decreased.  He has not been physically active. He took Tylenol for the first time this morning. His wife reports a knot in the center of his back that she noticed while changing a bandage. The patient also reports that his feet are swollen. The patient tested negative for COVID-19 yesterday, 02/29/2020. His Crohn's disease is stable at this time.   Past Medical History:  Diagnosis Date  . Blood dyscrasia   . BPH (benign prostatic hyperplasia)   . Cancer ([place])    SKIN/ POLYCYTHEMIA VERA  . Chronic kidney disease    RENAL INSUFF (40%)  . Crohn disease ([place])   . Crohn's disease ([place])   . Glaucoma   . Glaucoma   . Gout   . History of chicken pox   . Myelofibrosis ([place])   . Nosebleed   . RBBB   . Right bundle branch block     Past Surgical History:  Procedure Laterality Date  . CATARACT EXTRACTION W/PHACO Left 07/13/2018   Procedure: CATARACT EXTRACTION PHACO AND INTRAOCULAR LENS PLACEMENT (IOC);  Surgeon: [name], MD;  Location: [facility] ORS;  Service: Ophthalmology;  Laterality: Left;  [place] 00:39  CDE 5.14 Fluid pack lot # [id number]  . CATARACT EXTRACTION W/PHACO Right 08/03/2018   Procedure: CATARACT EXTRACTION PHACO AND  INTRAOCULAR LENS PLACEMENT (IOC);  Surgeon: [name], MD;  Location: [facility] ORS;  Service: Ophthalmology;  Laterality: Right;  [place] 00:43.4 CDE 5.23 Fluid pack Lot # [id number]  . COLONOSCOPY WITH PROPOFOL N/A 03/27/2017   Procedure: COLONOSCOPY WITH PROPOFOL;  Surgeon: [name], MD;  Location: [facility] ENDOSCOPY;  Service: Endoscopy;  Laterality: N/A;  . MOHS  SURGERY     EAR  . PROSTATE SURGERY  2008   Prostate Biopsy in [place] to elevated PSA.  per patient normal  . Skin Lesion Basal cell removed    . wart removal     from eyelid    Family History  Problem Relation Age of Onset  . Cancer Mother        Liver  . Cancer Father        Prostate  . Cancer Sister        Hodgkins lymphoma  . Cancer Paternal Aunt        Breast    Social History:  reports that he quit smoking about 16 years ago. His smoking use included cigarettes. He has a 5.00 pack-year smoking history. He has never used smokeless tobacco. He reports current alcohol use of about 4.0 - 5.0 standard drinks of alcohol per week. He reports that he does not use drugs. The patientpreviously worked for [employer]. He no longer volunteers for [employer]. The patient's wife [phone]).He lives in [place]. The patient is accompanied by his wife today.  Allergies: No Known Allergies  Current Medications: Current Outpatient Medications  Medication Sig Dispense Refill  . acetaminophen (TYLENOL) 500 MG tablet Take 500 mg by mouth daily as needed for moderate pain.    Marland Kitchen allopurinol (ZYLOPRIM) 300 MG tablet TAKE 1/2 TABLET (150 MG TOTAL) BY MOUTH DAILY. 45 tablet 1  . amoxicillin-clavulanate (AUGMENTIN) 875-125 MG tablet Take 1 tablet by mouth 2 (two) times daily. 20 tablet 0  . azithromycin (ZITHROMAX) 250 MG tablet Take 500 mg (2 tabs) day 1 and 250 mg (1 tab) days 2-5. 6 each 0  . Cholecalciferol (VITAMIN D3) 1000 units CAPS Take 1,000 Units by mouth daily at 12 noon.     . Cyanocobalamin (RA VITAMIN B-12 TR) 1000 MCG TBCR Take 1,000 mcg by mouth daily at 12 noon.     . Fedratinib HCl (INREBIC) 100 MG CAPS Take 200 mg by mouth daily.     . finasteride (PROSCAR) 5 MG tablet Take 1 tablet (5 mg total) by mouth every evening. 90 tablet 3  . folic acid (FOLVITE) 1 MG tablet Take 1 mg by mouth daily at 12 noon.     . latanoprost (XALATAN) 0.005 % ophthalmic  solution Place 1 drop into both eyes at bedtime.     . Multiple Vitamins-Minerals (CENTRUM SILVER ULTRA MENS) TABS Take 1 tablet by mouth daily at 12 noon.     . Omega-3 Fatty Acids (KP FISH OIL) 1200 MG CAPS Take 1,200 mg by mouth daily.     . sodium bicarbonate 650 MG tablet Take 650 mg by mouth 2 (two) times daily.    . timolol (TIMOPTIC) 0.5 % ophthalmic solution Place 1 drop into both eyes daily. In AM  5  . albuterol (VENTOLIN HFA) 108 (90 Base) MCG/ACT inhaler Inhale 2 puffs into the lungs every 6 (six) hours as needed for wheezing or shortness of breath. (Patient not taking: Reported on 03/01/2020) 18 g 0   No current facility-administered medications for this visit.    Review of Systems  Constitutional: Positive for malaise/fatigue. Negative for chills, diaphoresis, fever and weight loss (up 1 lb).       Feels better than yesterday.  HENT: Negative.  Negative for congestion, ear discharge, ear pain, nosebleeds, sinus pain and sore throat.   Eyes: Negative for blurred vision and double vision.        S/p cataract surgery. Uses reading glasses.  Respiratory: Positive for cough (improved) and shortness of breath (improved since yesterday). Negative for hemoptysis and wheezing.   Cardiovascular: Positive for leg swelling (bilateral foot swelling). Negative for chest pain and palpitations.  Gastrointestinal: Negative for abdominal pain, constipation, diarrhea, heartburn, nausea and vomiting.       Crohn's (no symptoms).  Genitourinary: Negative.  Negative for dysuria, frequency and urgency.  Musculoskeletal: Negative for back pain, falls and myalgias.  Skin: Negative.  Negative for itching and rash.  Neurological: Negative for dizziness, tremors, sensory change, speech change, weakness and headaches.  Endo/Heme/Allergies: Positive for environmental allergies (seasonal). Does not bruise/bleed easily.  Psychiatric/Behavioral: Positive for memory loss (mild). Negative for depression. The  patient is not nervous/anxious.   All other systems reviewed and are negative.   Performance status (ECOG): 1  Vitals Blood pressure 115/72, pulse 79, temperature (!) 97.2 F (36.2 C), temperature source Tympanic, weight 165 lb 3.8 oz (75 kg), SpO2 99 %.   Physical Exam Vitals and nursing note reviewed.  Constitutional:      General: He is not in acute distress.    Appearance: Normal appearance. He is not diaphoretic.     Interventions: Face mask in place.     Comments: Chronically fatigued appearing.  HENT:     Head: Normocephalic and atraumatic.     Mouth/Throat:     Mouth: Oropharynx is clear and moist and mucous membranes are normal. No oral lesions.     Pharynx: No oropharyngeal exudate.      Comments: Gray hair. Eyes:     General: No scleral icterus.       Right eye: No discharge.        Left eye: No discharge.     Extraocular Movements: EOM normal.     Conjunctiva/sclera: Conjunctivae normal.     Pupils: Pupils are equal, round, and reactive to light.     Comments: Glasses.  Blue eyes.   Neck:     Vascular: No JVD.  Cardiovascular:     Rate and Rhythm: Normal rate and regular rhythm.     Pulses: Intact distal pulses.     Heart sounds: Normal heart sounds. No murmur heard.  No friction rub. No gallop.   Pulmonary:     Effort: Pulmonary effort is normal. No respiratory distress.     Breath sounds: Normal breath sounds. No wheezing, rhonchi or rales.  Chest:     Chest wall: No tenderness.  Abdominal:     General: Bowel sounds are normal. There is no distension.     Palpations: Abdomen is soft. There is splenomegaly (1-2 FB above the umbilicus). There is no hepatomegaly or mass.     Tenderness: There is no abdominal tenderness. There is no guarding or rebound.  Musculoskeletal:        General: No tenderness or edema. Normal range of motion.     Cervical back: Normal range of motion and neck supple.  Lymphadenopathy:     Head:     Right side of head: No  preauricular, posterior auricular or occipital adenopathy.     Left side of head: No  preauricular, posterior auricular or occipital adenopathy.     Cervical: No cervical adenopathy.     Upper Body:  No axillary adenopathy present.    Right upper body: No supraclavicular adenopathy.     Left upper body: No supraclavicular adenopathy.     Lower Body: No right inguinal adenopathy. No left inguinal adenopathy.  Skin:    General: Skin is warm, dry and intact.     Coloration: Skin is not pale.     Findings: No bruising, erythema, lesion or rash.     Comments: Bone marrow biopsy site healing well.  Neurological:     Mental Status: He is alert and oriented to person, place, and time.  Psychiatric:        Mood and Affect: Mood and affect normal.        Behavior: Behavior normal.        Thought Content: Thought content normal.        Judgment: Judgment normal.    Appointment on 02/29/2020  Component Date Value Ref Range Status  . Sodium 02/29/2020 134* 135 - 145 mmol/L Final  . Potassium 02/29/2020 4.7  3.5 - 5.1 mmol/L Final  . Chloride 02/29/2020 102  98 - 111 mmol/L Final  . CO2 02/29/2020 23  22 - 32 mmol/L Final  . Glucose, Bld 02/29/2020 93  70 - 99 mg/dL Final   Glucose reference range applies only to samples taken after fasting for at least 8 hours.  . BUN 02/29/2020 35* 8 - 23 mg/dL Final  . Creatinine, Ser 02/29/2020 2.04* 0.61 - 1.24 mg/dL Final  . Calcium 02/29/2020 8.7* 8.9 - 10.3 mg/dL Final  . Total Protein 02/29/2020 8.9* 6.5 - 8.1 g/dL Final  . Albumin 02/29/2020 3.4* 3.5 - 5.0 g/dL Final  . AST 02/29/2020 51* 15 - 41 U/L Final  . ALT 02/29/2020 14  0 - 44 U/L Final  . Alkaline Phosphatase 02/29/2020 108  38 - 126 U/L Final  . Total Bilirubin 02/29/2020 0.8  0.3 - 1.2 mg/dL Final  . GFR calc non Af Amer 02/29/2020 32* >60 mL/min Final  . GFR calc Af Amer 02/29/2020 37* >60 mL/min Final  . Anion gap 02/29/2020 9  5 - 15 Final   Performed at [facility], [address] [place], [place] [address]  . WBC 02/29/2020 43.4* 4.0 - 10.5 K/uL Final  . RBC 02/29/2020 3.57* 4.22 - 5.81 MIL/uL Final  . Hemoglobin 02/29/2020 9.5* 13.0 - 17.0 g/dL Final  . HCT 02/29/2020 30.2* 39 - 52 % Final  . MCV 02/29/2020 84.6  80.0 - 100.0 fL Final  . MCH 02/29/2020 26.6  26.0 - 34.0 pg Final  . MCHC 02/29/2020 31.5  30.0 - 36.0 g/dL Final  . RDW 02/29/2020 22.7* 11.5 - 15.5 % Final  . Platelets 02/29/2020 404* 150 - 400 K/uL Final  . nRBC 02/29/2020 2.2* 0.0 - 0.2 % Final  . Neutrophils Relative % 02/29/2020 50  % Final  . Neutro Abs 02/29/2020 25.6* 1.7 - 7.7 K/uL Final  . Band Neutrophils 02/29/2020 9  % Final  . Lymphocytes Relative 02/29/2020 7  % Final  . Lymphs Abs 02/29/2020 3.0  0.7 - 4.0 K/uL Final  . Monocytes Relative 02/29/2020 3  % Final  . Monocytes Absolute 02/29/2020 1.3* 0 - 1 K/uL Final  . Eosinophils Relative 02/29/2020 1  % Final  . Eosinophils Absolute 02/29/2020 0.4  0 - 0 K/uL Final  . Basophils Relative 02/29/2020 9  %  Final  . Basophils Absolute 02/29/2020 3.9* 0 - 0 K/uL Final  . WBC Morphology 02/29/2020 CONSISTANT WITH PREVIOUS FINDINGS. 8% BLASTS NOTED ON SMEAR. SEE PATHOLOGIST REVIEW ON 6.02.2021 SEE FLOW CYTOMETRY RESULTS ON 4.06.2021 DIFF CONFIRMED BY MANUAL.   Final  . RBC Morphology 02/29/2020 MIXED RBC POPULATION   Final  . Smear Review 02/29/2020 PLATELETS APPEAR ADEQUATE   Final   Comment: Reviewed PLATELETS VARY IN SIZE AND GRANULATION.   Marland Kitchen Metamyelocytes Relative 02/29/2020 9  % Final  . Myelocytes 02/29/2020 4  % Final  . Blasts 02/29/2020 8  % Final  . Abs Immature Granulocytes 02/29/2020 5.60* 0.00 - 0.07 K/uL Final  . Polychromasia 02/29/2020 PRESENT   Final  . Hypogranular Platelets 02/29/2020 PRESENT   Final   Performed at [facility], [address], [place], [place] [address]  Hospital Outpatient Visit on 02/28/2020  Component Date Value Ref Range Status  . WBC 02/28/2020 33.6* 4.0 - 10.5 K/uL Final    . RBC 02/28/2020 3.41* 4.22 - 5.81 MIL/uL Final  . Hemoglobin 02/28/2020 9.0* 13.0 - 17.0 g/dL Final  . HCT 02/28/2020 29.2* 39 - 52 % Final  . MCV 02/28/2020 85.6  80.0 - 100.0 fL Final  . MCH 02/28/2020 26.4  26.0 - 34.0 pg Final  . MCHC 02/28/2020 30.8  30.0 - 36.0 g/dL Final  . RDW 02/28/2020 22.8* 11.5 - 15.5 % Final  . Platelets 02/28/2020 338  150 - 400 K/uL Final  . nRBC 02/28/2020 1.5* 0.0 - 0.2 % Final  . Neutrophils Relative % 02/28/2020 57  % Final  . Neutro Abs 02/28/2020 20.2* 1.7 - 7.7 K/uL Final  . Band Neutrophils 02/28/2020 3  % Final  . Lymphocytes Relative 02/28/2020 10  % Final  . Lymphs Abs 02/28/2020 3.4  0.7 - 4.0 K/uL Final  . Monocytes Relative 02/28/2020 2  % Final  . Monocytes Absolute 02/28/2020 0.7  0 - 1 K/uL Final  . Eosinophils Relative 02/28/2020 0  % Final  . Eosinophils Absolute 02/28/2020 0.0  0 - 0 K/uL Final  . Basophils Relative 02/28/2020 2  % Final  . Basophils Absolute 02/28/2020 0.7* 0 - 0 K/uL Final  . WBC Morphology 02/28/2020 ATYPICAL MONONUCLEAR CELLS   Final   MARKED LEFT SHIFT (>5% METAS,MYELOS AND PROS, OCC BLAST NOTED)  . RBC Morphology 02/28/2020 MORPHOLOGY UNREMARKABLE   Final  . Smear Review 02/28/2020 Normal platelet morphology   Final  . Other 02/28/2020 18  % Final  . Metamyelocytes Relative 02/28/2020 3  % Final  . Myelocytes 02/28/2020 5  % Final  . Abs Immature Granulocytes 02/28/2020 2.70* 0.00 - 0.07 K/uL Final   Performed at [facility], [address] [place], [place] [address]  . Path Review 02/28/2020 Blood smear is reviewed.   Final   Comment: Platelets are adequate. There is anemia with nucleated RBCs. Leukocytosis is present with immature granulocytes and myeloblasts. There is known history of a myeloproliferative neoplasm. Blood sample was collected to accompany the bone marrow specimen  collected today. Please refer to the bone marrow report for details. Reviewed by Lemmie Evens. Dicie Beam, MD. Performed at  [facility], [address] [place], [place] [address]     Assessment:  Ruben [name] is a 74 y.o. 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 He has had polycythemia dating back to 2013. Hematocrit was 62.1 with a hemoglobin of 19.8 on 03/28/2015. JAK 2 testing on 03/28/2015 revealed [id number] mutation. Erythropoietin level was  1.1 (low). 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  He began a phlebotomyprogram on 03/28/2015 to maintain a hematocrit goal of <45.Last phlebotomywason 05/02/2015. He is on a baby aspirin.  CBC on 11/28/2015 revealed a hematocrit of 39.3, hemoglobin 11.9, platelets 463,000, WBC 32,000 with an ANC of 25,000. Differential included 71% segs, 4% lymphocytes and 17% monocytes. Peripheral smear revealed leukocytosis with predominantly mature neutrophils, increased monocytes and rare blasts (<1%).  Bone marrowon 12/14/2015 revealed a persistent myeloproliferative neoplasm with myelofibrosis and alterations compatible with myelodysplatic progression. Marrow was packed (95-100% cellularity) with pan myelosis, multi-lineage dyspoiesis, and no significant increase in blasts. There was moderate to focally marked reticulin fibrosis (grade 2-3/3). Storage iron was not identified. Flow cytometry revealed non-specific atypical myeloid findings with no increase in blasts. 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 Cytogenetics were normal (46, XY).  Bone marrowon 04/07/2016 at [facility] revealed a hypercellular marrow (>95%) with persistent involvement by myeloid neoplasm with 5% blasts. There was mild reticulin fibrosis. FISH t(9;22) results were normal. 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 Cytogenetics are pending.  Bone marrowon 08/09/2018 revealed a hypercellular bone marrow (>95%) with persistent involvement by myeloid  neoplasm with <1% blasts by manual touch preparation differential. There was marked reticulin fibrosis with focal collagen deposition. Cytogenetics were normal (46, XY). - Myeloid mutation panel study is pending.  Bone marrowon 07/14/2019 revealed a hypercellular marrow (>95%) with persistent involvement by a myeloid neoplasm with extensive fibrosis and rare (< 5%) CD34 +blast byIHC. Myeloid mutation panel revealed the patient's previously reported variants inIDH2, JAK2, KRAS, NRAS and SRSF2 were again identified. There are no new variants. Cytogenetics are pending.  He was briefly on hydroxyurea500 mg a day (12/05/2015 - 12/19/2015 and 06/25/2016 - 07/03/2016). Platelet count increased to 1.18 million and white count to 54,800 without increased blasts on 06/25/2016. CBC on 07/02/2016 revealed a platelets of 429,000 with a white count 30,600. He began allopurinolon 12/19/2015. He began Jakafi 20 mg BID on 05/28/2016. He has been onJakafi25 mg BID since09/12/2016.  LDHis followed: 466 on 01/02/2016, 596 on 05/23/2016, 663 on 07/30/2016, 522 on 09/24/2016, 638 on 01/05/2017, 625 on 02/25/2017, 1144 on 06/10/2017, 720 on 06/17/2017, 728 on 06/24/2017, 724 on 01/06/2018, 616 on 04/07/2018, 671 on 07/07/2018, 820 on 10/06/2018, 702 on 01/04/2019, 10821 on 04/06/2019, 686 on 06/02/2019, 888 on 08/24/2019, 656 on 11/01/2019, 432 and 12/27/2019 and 910 on 01/24/2020. Triglycerideswere 183 (<150) on 08/20/2016, 180 on 01/05/2017, 312 on 06/17/2017, 165 on 11/25/2017, 113 on 02/17/2018.  He has urinary retention. Renal ultrasoundon 07/28/2016 revealed bilateral hydronephrosis (right >left) and a large postvoid residual (2 liters) suggesting bladder dysfunction or outlet obstruction. He underwent temporary Foley catheter placement. He performs I/O self catheterizations. He is on Flomax and finasteride. PSAwas 2.02 on 07/09/2016.  He is followed by GI (Dr. Vira Agar) for a  history of polypsand Crohn's disease.He had an unremarkable colonoscopy in 03/2017.AbdomenandpelvisCTon 04/03/2019 revealed mild wall thickening of portions of a loop of small bowel within the pelvis with intervening areas of mildly dilated small bowel,consistent with active Crohn's disease. No evidence of an abscess or fistula. No other evidence of bowel inflammation.There was splenomegaly(16.7 x 7.7 x 15.6 cm), developed since12/11/2011.There was mild bladder wall thickeningandenlargement of the prostate. Chronic bladder outlet obstructionwas felt to bethe etiology of bladder wall thickening.  He has chronicrenal insufficiency(Cr 1.85; CrCl 36 ml/min). SPEP on 09/23/2016 was negative. Spot urine revealed 17.4% of  27.3 mg/dL of a monoclonal protein. 24 hour urine on 10/16/2016 revealed no monoclonal protein. Renal function transiently decreased in 06/2017 after Bactrim. 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  He is followed by nephrology and urology.  He has a history of epistaxis. Normal studies included: PT, PTT, von Willebrand panel, and platelet function assay (PFA).  He has right foot drop. 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 There was no non-contrast MRI evidence of brain metastasis. There was only mild for age nonspecific cerebral white signal changes. Lumbar spine MRIon 06/09/2019 showed diffusely abnormal marrow signal in the visible skeleton, as seen on the brain MRI. There was no pathologic fracture and no extraosseous extension of tumor on the lumbar spine or visible sacrum. In conjunction with the splenomegaly by CT in [place] infiltration byleukemia/lymphoma may be the most likely etiology. Differential considerations included multiple myeloma and metastatic disease unknown primary. The involvement of the pelvis should be amenable to bone marrow biopsy.There was superimposed mild for age lumbar  spine degeneration with no spinal stenosis.There was mild degenerative L4 and L5 neural foraminal stenosis.  He was diagnosed with bilateral pneumonia on 02/29/2020.  He is on Augmentin and azithromycin.    He received bothCOVID-19vaccinesin 10/2019 (first on 10/28/2019).  COVID-19 testing was negative on 02/29/2020.  Symptomatically, he is feeling better.  Cough and fever have resolved.  Exam reveals splenomegaly.  Plan: 1.   Review interim labs. 2. Myeloproliferative neoplasm /Polycythemia rubra vera Clinically,he is doing fair. Hematocrit 30.2. Hemoglobin 9.5. Platelets404,000. [id number] ([id number]) on 02/29/2020.   Peripheral smear revealed 8% blasts. Bone marrow on 07/14/2019 revealeda hypercellular marrow (>95%) with persistent involvement by a myeloid neoplasm with extensive fibrosis and <5% blasts.  Myeloid mutation panel revealed the patient's previously reported variants.  Bone marrow on 02/28/2020 is pending.   Discuss concern for possible transformation to AML.   Contact patient once bone marrow results become available.   Dr Adriana Simas notified. He began fedratinibon 09/09/2019.   Fedratinib dose decreased last week secondary to CrCl. 3. Iron deficiency Hematocrit30.2. Hemoglobin 9.5. MCV84.6. Ferritin 205 on 02/22/2020. He denies any bleeding. He is off oral iron. 4.Hyponatremia Sodium 134. Patient is euvolemic. Continue to monitor. 5.Pneumonia Clinically, patient has improved on antibiotics.  Continue current antibiotics.  Patient to contact clinic if increased shortness of breath, worsening cough, fever or not doing well. 6.RTC as previously scheduled..  I discussed the assessment and treatment plan with the patient.  The patient was  provided an opportunity to ask questions and all were answered.  The patient agreed with the plan and demonstrated an understanding of the instructions.  The patient was advised to call back if the symptoms worsen or if the condition fails to improve as anticipated.  I provided 17 minutes of face-to-face time during this this encounter and > 50% was spent counseling as documented under my assessment and plan.  An additional 8-10  minutes were spent reviewing hiis chart (Epic and Care Everywhere) including notes, labs, and imaging studies.     Lequita Asal, MD, PhD    03/01/2020, 4:21 PM  I, Selena Batten, am acting as scribe for [employer]. Mike Gip, MD, PhD.  I, Inge Waldroup C. Mike Gip, MD, have reviewed the above documentation for accuracy and completeness, and I agree with the above.

## 2020-02-29 NOTE — Progress Notes (Signed)
Pt comes in reporting cough for the last month or so, but it has gotten progressively worse over the last 10 days. Wife reports that she has heard some rattling in his chest. Pt reports that he feels like he "loses his breath" while talking, pt does have to stop and take breaths during sentences; takes sighing breathes occasionally. Pt had BMBx yesterday, low grade fever today.

## 2020-02-29 NOTE — Progress Notes (Signed)
Yes I will

## 2020-03-01 ENCOUNTER — Encounter: Payer: Self-pay | Admitting: Hematology and Oncology

## 2020-03-01 ENCOUNTER — Inpatient Hospital Stay (HOSPITAL_BASED_OUTPATIENT_CLINIC_OR_DEPARTMENT_OTHER): Payer: Medicare Other | Admitting: Hematology and Oncology

## 2020-03-01 ENCOUNTER — Other Ambulatory Visit: Payer: Self-pay

## 2020-03-01 VITALS — BP 115/72 | HR 79 | Temp 97.2°F | Wt 165.2 lb

## 2020-03-01 DIAGNOSIS — D7581 Myelofibrosis: Secondary | ICD-10-CM

## 2020-03-01 DIAGNOSIS — E871 Hypo-osmolality and hyponatremia: Secondary | ICD-10-CM | POA: Diagnosis not present

## 2020-03-01 DIAGNOSIS — Z7189 Other specified counseling: Secondary | ICD-10-CM

## 2020-03-01 DIAGNOSIS — D45 Polycythemia vera: Secondary | ICD-10-CM | POA: Diagnosis not present

## 2020-03-01 DIAGNOSIS — N289 Disorder of kidney and ureter, unspecified: Secondary | ICD-10-CM | POA: Diagnosis not present

## 2020-03-01 DIAGNOSIS — J189 Pneumonia, unspecified organism: Secondary | ICD-10-CM | POA: Diagnosis not present

## 2020-03-01 DIAGNOSIS — C9200 Acute myeloblastic leukemia, not having achieved remission: Secondary | ICD-10-CM | POA: Diagnosis not present

## 2020-03-01 DIAGNOSIS — D649 Anemia, unspecified: Secondary | ICD-10-CM | POA: Diagnosis not present

## 2020-03-01 LAB — SURGICAL PATHOLOGY

## 2020-03-01 NOTE — Progress Notes (Signed)
Pt here for follow up after [facility] visit yesterday. Reports feeling better and less short of breath, cough has improved some.

## 2020-03-02 ENCOUNTER — Inpatient Hospital Stay (HOSPITAL_BASED_OUTPATIENT_CLINIC_OR_DEPARTMENT_OTHER): Payer: Medicare Other | Admitting: Hospice and Palliative Medicine

## 2020-03-02 ENCOUNTER — Other Ambulatory Visit: Payer: Self-pay | Admitting: *Deleted

## 2020-03-02 ENCOUNTER — Inpatient Hospital Stay: Payer: Medicare Other

## 2020-03-02 VITALS — BP 112/60 | HR 78 | Temp 98.2°F | Resp 18

## 2020-03-02 DIAGNOSIS — D649 Anemia, unspecified: Secondary | ICD-10-CM | POA: Insufficient documentation

## 2020-03-02 DIAGNOSIS — C9500 Acute leukemia of unspecified cell type not having achieved remission: Secondary | ICD-10-CM | POA: Diagnosis not present

## 2020-03-02 DIAGNOSIS — C9200 Acute myeloblastic leukemia, not having achieved remission: Secondary | ICD-10-CM | POA: Diagnosis not present

## 2020-03-02 DIAGNOSIS — D7581 Myelofibrosis: Secondary | ICD-10-CM | POA: Diagnosis not present

## 2020-03-02 DIAGNOSIS — D45 Polycythemia vera: Secondary | ICD-10-CM | POA: Diagnosis not present

## 2020-03-02 DIAGNOSIS — J189 Pneumonia, unspecified organism: Secondary | ICD-10-CM | POA: Diagnosis not present

## 2020-03-02 DIAGNOSIS — R634 Abnormal weight loss: Secondary | ICD-10-CM | POA: Insufficient documentation

## 2020-03-02 DIAGNOSIS — N289 Disorder of kidney and ureter, unspecified: Secondary | ICD-10-CM | POA: Diagnosis not present

## 2020-03-02 DIAGNOSIS — E871 Hypo-osmolality and hyponatremia: Secondary | ICD-10-CM | POA: Diagnosis not present

## 2020-03-02 DIAGNOSIS — Z1159 Encounter for screening for other viral diseases: Principal | ICD-10-CM

## 2020-03-02 DIAGNOSIS — R799 Abnormal finding of blood chemistry, unspecified: Principal | ICD-10-CM

## 2020-03-02 LAB — URIC ACID: Uric Acid, Serum: 7.4 mg/dL (ref 3.7–8.6)

## 2020-03-02 LAB — CBC WITH DIFFERENTIAL/PLATELET
Abs Immature Granulocytes: 2.6 10*3/uL — ABNORMAL HIGH (ref 0.00–0.07)
Band Neutrophils: 1 %
Basophils Absolute: 0.9 10*3/uL — ABNORMAL HIGH (ref 0.0–0.1)
Basophils Relative: 2 %
Blasts: 18 %
Eosinophils Absolute: 1.3 10*3/uL — ABNORMAL HIGH (ref 0.0–0.5)
Eosinophils Relative: 3 %
HCT: 28.9 % — ABNORMAL LOW (ref 39.0–52.0)
Hemoglobin: 8.9 g/dL — ABNORMAL LOW (ref 13.0–17.0)
Lymphocytes Relative: 8 %
Lymphs Abs: 3.4 10*3/uL (ref 0.7–4.0)
MCH: 26.1 pg (ref 26.0–34.0)
MCHC: 30.8 g/dL (ref 30.0–36.0)
MCV: 84.8 fL (ref 80.0–100.0)
Metamyelocytes Relative: 5 %
Monocytes Absolute: 3.4 10*3/uL — ABNORMAL HIGH (ref 0.1–1.0)
Monocytes Relative: 8 %
Myelocytes: 1 %
Neutro Abs: 23.5 10*3/uL — ABNORMAL HIGH (ref 1.7–7.7)
Neutrophils Relative %: 54 %
Platelets: 401 10*3/uL — ABNORMAL HIGH (ref 150–400)
RBC: 3.41 MIL/uL — ABNORMAL LOW (ref 4.22–5.81)
RDW: 22.5 % — ABNORMAL HIGH (ref 11.5–15.5)
Smear Review: ADEQUATE
WBC: 42.7 10*3/uL — ABNORMAL HIGH (ref 4.0–10.5)
nRBC: 1.8 % — ABNORMAL HIGH (ref 0.0–0.2)

## 2020-03-02 LAB — COMPREHENSIVE METABOLIC PANEL
ALT: 14 U/L (ref 0–44)
AST: 44 U/L — ABNORMAL HIGH (ref 15–41)
Albumin: 3.1 g/dL — ABNORMAL LOW (ref 3.5–5.0)
Alkaline Phosphatase: 100 U/L (ref 38–126)
Anion gap: 10 (ref 5–15)
BUN: 40 mg/dL — ABNORMAL HIGH (ref 8–23)
CO2: 22 mmol/L (ref 22–32)
Calcium: 8.7 mg/dL — ABNORMAL LOW (ref 8.9–10.3)
Chloride: 101 mmol/L (ref 98–111)
Creatinine, Ser: 2.36 mg/dL — ABNORMAL HIGH (ref 0.61–1.24)
GFR calc Af Amer: 31 mL/min — ABNORMAL LOW (ref >60)
GFR calc non Af Amer: 27 mL/min — ABNORMAL LOW (ref >60)
Glucose, Bld: 120 mg/dL — ABNORMAL HIGH (ref 70–99)
Potassium: 4.5 mmol/L (ref 3.5–5.1)
Sodium: 133 mmol/L — ABNORMAL LOW (ref 135–145)
Total Bilirubin: 0.8 mg/dL (ref 0.3–1.2)
Total Protein: 8.6 g/dL — ABNORMAL HIGH (ref 6.5–8.1)

## 2020-03-02 NOTE — Unmapped (Signed)
Received message from Dr. Nelva Villarreal at [facility] that 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  There is not an official path report yet.      The pathologist is Dr. Robina Villarreal [phone].  No answer on my attempt x 1 thus far.      Recall:  Patient is 74 y.o. 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  He has been treated with ruxolitinib with improvement of splenomegaly and constitutional symptoms until 08/2019 when the splenomegaly worsened and constitutional symptoms returned. He was therefore transitioned to fedratinib at that time.     The most recent labs in [facility] are from 12/27/2019:  WBC 27.9.  Hgb 10.8.  Plts 303.      He has never met with BMT for consideration of allogeneic stem cell transplantation, though this was discussed during our initial meeting in 2017.     Plan:  1. Will request that my team get final pathology report from Dr. Laureen Villarreal.   2. Will add patient on to clinic Tuesday, 6/15 at 0830 with labs just prior (ordered)  3. Will request venetoclax prior authorization to begin - anticipate Azacitidine + venetoclax.  Clinical trial enrollment currently on-hold. WBC will need to be decreased prior to starting venetoclax based on the last WBC: would start hydroxyurea 500mg  po BID.     Howard Nunnery, MD  Hematology

## 2020-03-02 NOTE — Progress Notes (Signed)
Symptom Management Consult note [facility]  Telephone:([phone]) [phone] [phone]  Patient Care Team: [name], MD as PCP - General (Family Medicine) [name], MD (Inactive) (Gastroenterology) Dingeldein, Remo Lipps, MD (Ophthalmology) Lequita Asal, MD as Referring Physician (Hematology and Oncology) Dasher, Rayvon Char, MD as Referring Physician (Dermatology) Anthonette Legato, MD as Consulting Physician (Internal Medicine) Hollice Espy, MD as Consulting Physician (Urology) Crissie Sickles, MD as Referring Physician (Hematology and Oncology) Vladimir Crofts, MD as Consulting Physician (Neurology)   Name of the patient: Ruben [name]  [id number]  Aug 14, 1946   Date of visit: 03/02/2020   Diagnosis-polycythemia rubra vera (PV)  Reason for consult: [name] is a 74 year old male with past medical history significant for BPH, chronic kidney disease, Crohn's disease, glaucoma, gout, myelofibrosis, right bundle branch block and currently being treated for polycythemia vera.  He was started on fedratinib back in January 2021.  Has not required therapeutic phlebotomy since 2016.  Patient was seen in [facility] on 02/29/2020 and started on oral antibiotics for community-acquired pneumonia.  He was seen yesterday by Dr. Mike Gip for follow-up and was feeling some better.  Cough was improved and he was less short of breath.  Patient is status post bone marrow biopsy on 02/28/2020.  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  Patient was advised by his oncologist to come to the clinic today for additional labs.  Today, patient reports he is doing well.  He denies any significant changes or concerns.  He reports improved cough and shortness of breath on antibiotics.  He has occasional diarrhea.  No fever or chills.  No bleeding.  No neurological changes.  Chronic lower extremity edema relatively unchanged.  He has chronic fatigue.  No CNS  changes.  ECOG FS:1 - Symptomatic but completely ambulatory  Review of systems- Review of Systems  Constitutional: Negative.  Negative for chills, fever, malaise/fatigue and weight loss.  HENT: Negative for congestion, ear pain and tinnitus.   Eyes: Negative.  Negative for blurred vision and double vision.  Respiratory: Negative for cough, sputum production and shortness of breath.   Cardiovascular: Negative.  Negative for chest pain, palpitations and leg swelling.  Gastrointestinal: Negative.  Negative for abdominal pain, constipation, diarrhea, nausea and vomiting.  Genitourinary: Negative for dysuria, frequency and urgency.  Musculoskeletal: Negative for back pain and falls.  Skin: Negative.  Negative for rash.  Neurological: Negative.  Negative for weakness and headaches.  Endo/Heme/Allergies: Negative.  Does not bruise/bleed easily.  Psychiatric/Behavioral: Negative.  Negative for depression. The patient is not nervous/anxious and does not have insomnia.      Current treatment- Fedratinib 200 mg daily  No Known Allergies   Past Medical History:  Diagnosis Date  . Blood dyscrasia   . BPH (benign prostatic hyperplasia)   . Cancer ([place])    SKIN/ POLYCYTHEMIA VERA  . Chronic kidney disease    RENAL INSUFF (40%)  . Crohn disease ([place])   . Crohn's disease ([place])   . Glaucoma   . Glaucoma   . Gout   . History of chicken pox   . Myelofibrosis ([place])   . Nosebleed   . RBBB   . Right bundle branch block      Past Surgical History:  Procedure Laterality Date  . CATARACT EXTRACTION W/PHACO Left 07/13/2018   Procedure: CATARACT EXTRACTION PHACO AND INTRAOCULAR LENS PLACEMENT (IOC);  Surgeon: [name], MD;  Location: [facility] ORS;  Service: Ophthalmology;  Laterality: Left;  [place] 00:39  CDE 5.14 Fluid pack lot # [id number]  . CATARACT EXTRACTION W/PHACO Right 08/03/2018   Procedure: CATARACT EXTRACTION PHACO AND INTRAOCULAR LENS PLACEMENT (IOC);  Surgeon: [name],  MD;  Location: [facility] ORS;  Service: Ophthalmology;  Laterality: Right;  [place] 00:43.4 CDE 5.23 Fluid pack Lot # [id number]  . COLONOSCOPY WITH PROPOFOL N/A 03/27/2017   Procedure: COLONOSCOPY WITH PROPOFOL;  Surgeon: [name], MD;  Location: [facility] ENDOSCOPY;  Service: Endoscopy;  Laterality: N/A;  . MOHS SURGERY     EAR  . PROSTATE SURGERY  2008   Prostate Biopsy in [place] to elevated PSA.  per patient normal  . Skin Lesion Basal cell removed    . wart removal     from eyelid    Social History   Socioeconomic History  . Marital status: Married    Spouse name: Not on file  . Number of children: 2  . Years of education: Not on file  . Highest education level: Bachelor's degree (e.g., BA, AB, BS)  Occupational History  . Occupation: Retired  Tobacco Use  . Smoking status: Former Smoker    Packs/day: 0.25    Years: 20.00    Pack years: 5.00    Types: Cigarettes    Quit date: 09/28/2003    Years since quitting: 16.4  . Smokeless tobacco: Never Used  Vaping Use  . Vaping Use: Never used  Substance and Sexual Activity  . Alcohol use: Yes    Alcohol/week: 4.0 - 5.0 standard drinks    Types: 4 - 5 Glasses of wine per week  . Drug use: No  . Sexual activity: Not on file  Other Topics Concern  . Not on file  Social History Narrative  . Not on file   Social Determinants of Health   Financial Resource Strain:   . Difficulty of Paying Living Expenses:   Food Insecurity:   . Worried About Charity fundraiser in the Last Year:   . Arboriculturist in the Last Year:   Transportation Needs:   . Film/video editor (Medical):   Marland Kitchen Lack of Transportation (Non-Medical):   Physical Activity:   . Days of Exercise per Week:   . Minutes of Exercise per Session:   Stress:   . Feeling of Stress :   Social Connections:   . Frequency of Communication with Friends and Family:   . Frequency of Social Gatherings with Friends and Family:   . Attends Religious Services:   . Active  Member of Clubs or Organizations:   . Attends Archivist Meetings:   Marland Kitchen Marital Status:   Intimate Partner Violence:   . Fear of Current or Ex-Partner:   . Emotionally Abused:   Marland Kitchen Physically Abused:   . Sexually Abused:     Family History  Problem Relation Age of Onset  . Cancer Mother        Liver  . Cancer Father        Prostate  . Cancer Sister        Hodgkins lymphoma  . Cancer Paternal Aunt        Breast     Current Outpatient Medications:  .  acetaminophen (TYLENOL) 500 MG tablet, Take 500 mg by mouth daily as needed for moderate pain., Disp: , Rfl:  .  albuterol (VENTOLIN HFA) 108 (90 Base) MCG/ACT inhaler, Inhale 2 puffs into the lungs every 6 (six) hours as needed for wheezing or shortness of breath. (  Patient not taking: Reported on 03/01/2020), Disp: 18 g, Rfl: 0 .  allopurinol (ZYLOPRIM) 300 MG tablet, TAKE 1/2 TABLET (150 MG TOTAL) BY MOUTH DAILY., Disp: 45 tablet, Rfl: 1 .  amoxicillin-clavulanate (AUGMENTIN) 875-125 MG tablet, Take 1 tablet by mouth 2 (two) times daily., Disp: 20 tablet, Rfl: 0 .  azithromycin (ZITHROMAX) 250 MG tablet, Take 500 mg (2 tabs) day 1 and 250 mg (1 tab) days 2-5., Disp: 6 each, Rfl: 0 .  Cholecalciferol (VITAMIN D3) 1000 units CAPS, Take 1,000 Units by mouth daily at 12 noon. , Disp: , Rfl:  .  Cyanocobalamin (RA VITAMIN B-12 TR) 1000 MCG TBCR, Take 1,000 mcg by mouth daily at 12 noon. , Disp: , Rfl:  .  Fedratinib HCl (INREBIC) 100 MG CAPS, Take 200 mg by mouth daily. , Disp: , Rfl:  .  finasteride (PROSCAR) 5 MG tablet, Take 1 tablet (5 mg total) by mouth every evening., Disp: 90 tablet, Rfl: 3 .  folic acid (FOLVITE) 1 MG tablet, Take 1 mg by mouth daily at 12 noon. , Disp: , Rfl:  .  latanoprost (XALATAN) 0.005 % ophthalmic solution, Place 1 drop into both eyes at bedtime. , Disp: , Rfl:  .  Multiple Vitamins-Minerals (CENTRUM SILVER ULTRA MENS) TABS, Take 1 tablet by mouth daily at 12 noon. , Disp: , Rfl:  .  Omega-3 Fatty  Acids (KP FISH OIL) 1200 MG CAPS, Take 1,200 mg by mouth daily. , Disp: , Rfl:  .  sodium bicarbonate 650 MG tablet, Take 650 mg by mouth 2 (two) times daily., Disp: , Rfl:  .  timolol (TIMOPTIC) 0.5 % ophthalmic solution, Place 1 drop into both eyes daily. In AM, Disp: , Rfl: 5  Physical exam:  There were no vitals filed for this visit. Physical Exam Constitutional:      Appearance: Normal appearance.  HENT:     Head: Normocephalic and atraumatic.  Eyes:     Pupils: Pupils are equal, round, and reactive to light.  Cardiovascular:     Rate and Rhythm: Normal rate and regular rhythm.     Heart sounds: Normal heart sounds. No murmur heard.   Pulmonary:     Effort: Pulmonary effort is normal. No respiratory distress.     Breath sounds: Normal breath sounds. No wheezing.  Abdominal:     General: Bowel sounds are normal. There is no distension.     Palpations: Abdomen is soft.     Tenderness: There is no abdominal tenderness.  Musculoskeletal:        General: Normal range of motion.     Cervical back: Normal range of motion.  Skin:    General: Skin is warm and dry.     Findings: No rash.  Neurological:     General: No focal deficit present.     Mental Status: He is alert and oriented to person, place, and time.  Psychiatric:        Judgment: Judgment normal.      CMP Latest Ref Rng & Units 02/29/2020  Glucose 70 - 99 mg/dL 93  BUN 8 - 23 mg/dL 35(H)  Creatinine 0.61 - 1.24 mg/dL 2.04(H)  Sodium 135 - 145 mmol/L 134(L)  Potassium 3.5 - 5.1 mmol/L 4.7  Chloride 98 - 111 mmol/L 102  CO2 22 - 32 mmol/L 23  Calcium 8.9 - 10.3 mg/dL 8.7(L)  Total Protein 6.5 - 8.1 g/dL 8.9(H)  Total Bilirubin 0.3 - 1.2 mg/dL 0.8  Alkaline Phos 38 - 126  U/L 108  AST 15 - 41 U/L 51(H)  ALT 0 - 44 U/L 14   CBC Latest Ref Rng & Units 02/29/2020  WBC 4.0 - 10.5 K/uL 43.4(H)  Hemoglobin 13.0 - 17.0 g/dL 9.5(L)  Hematocrit 39 - 52 % 30.2(L)  Platelets 150 - 400 K/uL 404(H)    No images are  attached to the encounter.  DG Chest 2 View  Result Date: 02/29/2020 CLINICAL DATA:  Abdominal pain. Cough and shortness of breath for 2 months. EXAM: CHEST - 2 VIEW COMPARISON:  None. FINDINGS: Heart size is. Bilateral perihilar airspace opacities are present right greater than left. Small effusions are also present. Axial skeleton is within limits. IMPRESSION: 1. Bilateral perihilar airspace disease right greater than left. This is concerning for infection. Followup PA and lateral chest X-ray is recommended in 3-4 weeks following trial of antibiotic therapy to ensure resolution and exclude underlying malignancy. 2. Small bilateral pleural effusions. Electronically Signed   By: San Morelle M.D.   On: 02/29/2020 12:20   CT BONE MARROW BIOPSY & ASPIRATION  Result Date: 02/28/2020 INDICATION: History of Polycythemia Vera/myelofibrosis. Please perform CT-guided bone marrow biopsy for tissue diagnostic purposes. EXAM: CT-GUIDED BONE MARROW BIOPSY AND ASPIRATION MEDICATIONS: None ANESTHESIA/SEDATION: Fentanyl 100 mcg IV; Versed 2 mg IV Sedation Time: 12 Minutes; The patient was continuously monitored during the procedure by the interventional radiology nurse under my direct supervision. COMPLICATIONS: None immediate. PROCEDURE: Informed consent was obtained from the patient following an explanation of the procedure, risks, benefits and alternatives. The patient understands, agrees and consents for the procedure. All questions were addressed. A time out was performed prior to the initiation of the procedure. The patient was positioned prone and non-contrast localization CT was performed of the pelvis to demonstrate the iliac marrow spaces. The operative site was prepped and draped in the usual sterile fashion. Under sterile conditions and local anesthesia, a 22 gauge spinal needle was utilized for procedural planning. Next, an 11 gauge coaxial bone biopsy needle was advanced into the left iliac marrow  space. Needle position was confirmed with CT imaging. Initially, a bone marrow aspiration was attempted however no blood products were able to be aspirated despite appropriate needle positioning. As such, a bone marrow biopsy was obtained with the 11 gauge outer bone marrow device. The 11 gauge coaxial bone biopsy needle was re-advanced into a slightly different location within the left iliac marrow space, positioning was confirmed with CT imaging, however again, no aspirate was able to be obtained. As such, an additional bone marrow biopsy was obtained. Finally, given the lack adequate aspirate material, the 11 gauge coaxial bone biopsy needle was utilized to obtain a third separate bone marrow biopsy. The needle was removed and superficial hemostasis was obtained with manual compression. A dressing was applied. The patient tolerated the procedure well without immediate post procedural complication. IMPRESSION: Successful CT guided left iliac bone marrow core biopsy. Note, 3 core biopsies were obtained to improve diagnostic viability given lack of adequate aspirate despite appropriate needle positioning. Electronically Signed   By: Sandi Mariscal M.D.   On: 02/28/2020 09:30   Assessment and Plan- Patient is a 74 y.o. male who presents to [facility] for follow-up labs in light of recent bone marrow biopsy.  Patient reports improving pulmonary symptoms.  He continues on oral antibiotics.  Discussed the importance of hydration.  I spoke with Dr. Mike Gip and Dr. Gari Crown, pathologist in [place].  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  Patient had stat labs drawn today and will have blood transported to Dr. Gari Crown via courier for flow cytometry.  Dr. Mike Gip has been in communication with Dr. Adriana Simas at [facility] who will see patient for leukemia work-up and to discuss treatment options.  Dr. Mike Gip has also spoken with patient by phone to discuss the current work-up  and plan.  I again spoke with Dr. Mike Gip to review the labs drawn today in the clinic.  She will have follow-up with patient/wife once the flow cytometry has resulted.  Again, she has been in communication with heme-onc at [facility] who most likely will see patient early next week.  Plan: -CBC, CMP, Uric Acid -Flow Cytometry to be sent to [facility]: Dr. Juliene Pina -NGS Myeloid Panel to [facility] -Referral to [facility] hem/onc -Continue oral Augmentin/Azithomycin as previously prescribed  Disposition: Patient has follow-up on 03/07/20 Dr. Mike Gip. Patient is being referred to [facility]   Visit Diagnosis No diagnosis found.  Patient expressed understanding and was in agreement with this plan. He also understands that He can call clinic at any time with any questions, concerns, or complaints.   Greater than 50% was spent in counseling and coordination of care with this patient including but not limited to discussion of the relevant topics above (See A&P) including, but not limited to diagnosis and management of acute and chronic medical conditions.   Thank you for allowing me to participate in the care of this very pleasant patient.    Irean Hong, NP [place] at [facility]

## 2020-03-05 LAB — SURGICAL PATHOLOGY

## 2020-03-05 NOTE — Progress Notes (Signed)
[facility]  [address] [place], [place] [address] Phone: [phone]  Fax: [phone]   Clinic Day:  03/07/2020  Referring physician: [name], MD  Chief Complaint: Ruben [name] is a 74 y.o. 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  HPI: The patient was last seen in the hematology clinic on 03/01/2020. At that time, he was feeling better. Cough and fever had resolved. Exam revealed splenomegaly. Hematocrit was 30.2, hemoglobin 9.5, platelets 404,000, WBC 43,400 (ANC 25,600).  Peripheral smear revealed 8% blasts.  Sodium was 134, creatinine 2.04, calcium 8.7, total protein 8.9, albumin 3.4, AST 51.  Bone marrow on 02/28/2020 was pending.  He continued Augmentin and azithromycin for pneumonia.   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  Flow cytometry revealed 22% myeloblasts (CD13, CD34, CD38, CD117, CD200, HLA-Dr+).  Myeloid mutation panel is pending.  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  Patient saw Altha Harm, NP on 03/02/2020. He was doing well. Cough and shortness of breath improved on antibiotics.  He had occasional diarrhea. He had chronic fatigue. Chronic lower extremity edema was relatively unchanged. No changes were made to his current regimen.   The patient was seen by Eli Phillips, NP with Dr Adriana Simas on 03/06/2020.  Options were discussed: 1) Do nothing with survival < 6 months, 2) intensive chemotherapy with admission to the hospital for CPX-351 chemotherapy and response rates of 70% CR and 12 month survival 70%, and 3) lower dose chemotherapy with decitabine and venetoclax with a complete reponse of 50% and an overall survival of 9 months.  If a remission was obtained, consideration for an allogeneic stem cell transplant was discussed.   Hydroxyurea was started at 500 mg BID.  Labs and transfusion weekly for hemoglobin < 8.  During the interim, the patient has done well. He is breathing "ok".  He has some diarrhea. Patient has an ongoing cough and is on another round of azithromycin.  Patient is taking hydroxyurea 500 mg a day.  He remains on fedratinib.  Regarding his treatment, there were many questions asked and answered.    Past Medical History:  Diagnosis Date  . Blood dyscrasia   . BPH (benign prostatic hyperplasia)   . Cancer ([place])    SKIN/ POLYCYTHEMIA VERA  . Chronic kidney disease    RENAL INSUFF (40%)  . Crohn disease ([place])   . Crohn's disease ([place])   . Glaucoma   . Glaucoma   . Gout   . History of chicken pox   . Myelofibrosis ([place])   . Nosebleed   . RBBB   . Right bundle branch block     Past Surgical History:  Procedure Laterality Date  . CATARACT EXTRACTION W/PHACO Left 07/13/2018   Procedure: CATARACT EXTRACTION PHACO AND INTRAOCULAR LENS PLACEMENT (IOC);  Surgeon: [name], MD;  Location: [facility] ORS;  Service: Ophthalmology;  Laterality: Left;  [place] 00:39  CDE 5.14 Fluid pack lot # [id number]  . CATARACT EXTRACTION W/PHACO Right 08/03/2018   Procedure: CATARACT EXTRACTION PHACO AND INTRAOCULAR LENS PLACEMENT (IOC);  Surgeon: [name], MD;  Location: [facility] ORS;  Service: Ophthalmology;  Laterality: Right;  [place] 00:43.4 CDE 5.23 Fluid pack Lot # [id number]  . COLONOSCOPY WITH PROPOFOL N/A 03/27/2017   Procedure: COLONOSCOPY WITH PROPOFOL;  Surgeon: [name], MD;  Location: [facility] ENDOSCOPY;  Service: Endoscopy;  Laterality: N/A;  . MOHS SURGERY     EAR  . PROSTATE SURGERY  2008   Prostate Biopsy in [place] to elevated PSA.  per patient normal  . Skin Lesion Basal cell removed    . wart removal     from eyelid    Family History  Problem Relation Age of Onset  . Cancer Mother        Liver  . Cancer Father        Prostate  . Cancer Sister        Hodgkins lymphoma  .  Cancer Paternal Aunt        Breast    Social History:  reports that he quit smoking about 16 years ago. His smoking use included cigarettes. He has a 5.00 pack-year smoking history. He has never used smokeless tobacco. He reports current alcohol use of about 4.0 - 5.0 standard drinks of alcohol per week. He reports that he does not use drugs. The patientpreviously worked for [employer]. He no longer volunteers for [employer]. The patient's wife isJudith([phone]).He lives in [place]. The patient is accompanied by his wife today.  Allergies: No Known Allergies  Current Medications: Current Outpatient Medications  Medication Sig Dispense Refill  . acetaminophen (TYLENOL) 500 MG tablet Take 500 mg by mouth daily as needed for moderate pain.    Marland Kitchen allopurinol (ZYLOPRIM) 300 MG tablet TAKE 1/2 TABLET (150 MG TOTAL) BY MOUTH DAILY. 45 tablet 1  . amoxicillin-clavulanate (AUGMENTIN) 875-125 MG tablet Take 1 tablet by mouth 2 (two) times daily. 20 tablet 0  . azithromycin (ZITHROMAX) 250 MG tablet Take 500 mg (2 tabs) day 1 and 250 mg (1 tab) days 2-5. 6 each 0  . Cyanocobalamin (RA VITAMIN B-12 TR) 1000 MCG TBCR Take 1,000 mcg by mouth daily at 12 noon.     . Fedratinib HCl (INREBIC) 100 MG CAPS Take 200 mg by mouth daily.     . finasteride (PROSCAR) 5 MG tablet Take 1 tablet (5 mg total) by mouth every evening. 90 tablet 3  . hydroxyurea (HYDREA) 500 MG capsule Take by mouth.    . latanoprost (XALATAN) 0.005 % ophthalmic solution Place 1 drop into both eyes at bedtime.     . sodium bicarbonate 650 MG tablet Take 650 mg by mouth 2 (two) times daily.    . timolol (TIMOPTIC) 0.5 % ophthalmic solution Place 1 drop into both eyes daily. In AM  5  . albuterol (VENTOLIN HFA) 108 (90 Base) MCG/ACT inhaler Inhale 2 puffs into the lungs every 6 (six) hours as needed for wheezing or shortness of breath. (Patient not taking: Reported on 03/07/2020) 18 g 0  . aspirin 81 MG chewable tablet Chew  by mouth. (Patient not taking: Reported on 03/07/2020)    . Cholecalciferol (VITAMIN D3) 1000 units CAPS Take 1,000 Units by mouth daily at 12 noon.  (Patient not taking: Reported on 03/07/2020)    . clobetasol ointment (TEMOVATE) 0.05 %  (Patient not taking: Reported on 03/07/2020)    . folic acid (FOLVITE) 1 MG tablet Take 1 mg by mouth daily at 12 noon.  (Patient not taking: Reported on 03/07/2020)    . Multiple Vitamins-Minerals (CENTRUM SILVER ULTRA MENS) TABS Take 1 tablet by mouth daily at 12 noon.  (Patient not taking: Reported on 03/07/2020)    . neomycin-polymyxin-dexamethasone (MAXITROL) 0.1 % ophthalmic suspension APPLY 1 DROP INTO RIGHT EYE THREE TIMES A DAY (Patient not taking: Reported on 03/07/2020)    .  Omega-3 Fatty Acids (KP FISH OIL) 1200 MG CAPS Take 1,200 mg by mouth daily.  (Patient not taking: Reported on 03/07/2020)    . venetoclax 100 MG TABS Take by mouth. (Patient not taking: Reported on 03/07/2020)     No current facility-administered medications for this visit.    Review of Systems  Constitutional: Negative for chills, diaphoresis, fever, malaise/fatigue and weight loss (up 2 lbs).       Feels better than yesterday.  HENT: Negative.  Negative for congestion, ear discharge, ear pain, nosebleeds, sinus pain and sore throat.   Eyes: Negative for blurred vision and double vision.        S/p cataract surgery. Uses reading glasses.  Respiratory: Positive for cough (improved) and shortness of breath (improved since yesterday). Negative for hemoptysis and wheezing.   Cardiovascular: Negative for chest pain, palpitations and leg swelling (bilateral foot swelling).  Gastrointestinal: Positive for diarrhea. Negative for abdominal pain, constipation, heartburn, nausea and vomiting.       Crohn's (no symptoms).  Genitourinary: Negative.  Negative for dysuria, frequency and urgency.  Musculoskeletal: Negative for back pain, falls and myalgias.  Skin: Negative.  Negative for itching  and rash.  Neurological: Negative for dizziness, tremors, sensory change, speech change, weakness and headaches.  Endo/Heme/Allergies: Positive for environmental allergies (seasonal). Does not bruise/bleed easily.  Psychiatric/Behavioral: Positive for memory loss (mild). Negative for depression. The patient is not nervous/anxious.   All other systems reviewed and are negative.  Performance status (ECOG): 1  Vitals Blood pressure 127/70, pulse 80, temperature (!) 96.5 F (35.8 C), temperature source Tympanic, resp. rate 16, weight 167 lb 3.5 oz (75.8 kg), SpO2 100 %.   Physical Exam Vitals and nursing note reviewed.  Constitutional:      General: He is not in acute distress.    Appearance: Normal appearance. He is not diaphoretic.     Interventions: Face mask in place.     Comments: Chronically fatigued appearing.  HENT:     Head: Normocephalic and atraumatic.  Eyes:     General: No scleral icterus.    Conjunctiva/sclera: Conjunctivae normal.     Comments: Glasses.  Blue eyes.   Neck:     Vascular: No JVD.  Neurological:     Mental Status: He is alert and oriented to person, place, and time.  Psychiatric:        Behavior: Behavior normal.        Thought Content: Thought content normal.        Judgment: Judgment normal.    No visits with results within 3 Day(s) from this visit.  Latest known visit with results is:  Orders Only on 03/02/2020  Component Date Value Ref Range Status  . Uric Acid, Serum 03/02/2020 7.4  3.7 - 8.6 mg/dL Final   Performed at [facility], [address] [place], [place] [address]    Assessment:  TARA WICH is a 74 y.o. 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 He has had polycythemia dating back to 2013. Hematocrit was 62.1 with a hemoglobin of 19.8 on 03/28/2015. 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 Erythropoietin level was 1.1 (low). 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  He began a  phlebotomyprogram on 03/28/2015 to maintain a hematocrit goal of <45.Last phlebotomywason 05/02/2015. He is on a baby aspirin.  CBC on 11/28/2015 revealed a hematocrit of 39.3, hemoglobin 11.9, platelets 463,000, WBC 32,000 with an ANC of 25,000. Differential included 71% segs, 4% lymphocytes and 17%  monocytes. Peripheral smear revealed leukocytosis with predominantly mature neutrophils, increased monocytes and rare blasts (<1%).  Bone marrowon 12/14/2015 revealed a persistent myeloproliferative neoplasm with myelofibrosis and alterations compatible with myelodysplatic progression. Marrow was packed (95-100% cellularity) with pan myelosis, multi-lineage dyspoiesis, and no significant increase in blasts. There was moderate to focally marked reticulin fibrosis (grade 2-3/3). Storage iron was not identified. Flow cytometry revealed non-specific atypical myeloid findings with no increase in blasts. 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 Cytogenetics were normal (46, XY).  [facility] revealed a hypercellular marrow (>95%) with persistent involvement by myeloid neoplasm with 5% blasts. There was mild reticulin fibrosis. FISH t(9;22) results were normal. 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 Cytogenetics are pending.  Bone marrowon 08/09/2018 revealed a hypercellular bone marrow (>95%) with persistent involvement by myeloid neoplasm with <1% blasts by manual touch preparation differential. There was marked reticulin fibrosis with focal collagen deposition. Cytogenetics were normal (46, XY). - Myeloid mutation panel study is pending.  Bone marrowon 07/14/2019 revealed a hypercellular marrow (>95%) with persistent involvement by a myeloid neoplasm with extensive fibrosis and rare (< 5%) CD34 +blast byIHC. Myeloid mutation  panel revealed the patient's previously reported variants inIDH2, JAK2, KRAS, NRAS and SRSF2 were again identified. There are no new variants. Cytogenetics are pending.  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  Flow cytometry revealed 22% myeloblasts (CD13, CD34, CD38, CD117, CD200, HLA-Dr+).  Myeloid mutation panel is pending.   He was briefly on hydroxyurea500 mg a day (12/05/2015 - 12/19/2015 and 06/25/2016 - 07/03/2016). Platelet count increased to 1.18 million and white count to 54,800 without increased blasts on 06/25/2016. CBC on 07/02/2016 revealed a platelets of 429,000 with a white count 30,600. He began allopurinolon 12/19/2015. He began Jakafi 20 mg BID on 05/28/2016. He has been onJakafi25 mg BID since09/12/2016.  LDHis followed: 466 on 01/02/2016, 596 on 05/23/2016, 663 on 07/30/2016, 522 on 09/24/2016, 638 on 01/05/2017, 625 on 02/25/2017, 1144 on 06/10/2017, 720 on 06/17/2017, 728 on 06/24/2017, 724 on 01/06/2018, 616 on 04/07/2018, 671 on 07/07/2018, 820 on 10/06/2018, 702 on 01/04/2019, 10821 on 04/06/2019, 686 on 06/02/2019, 888 on 08/24/2019, 656 on 11/01/2019, 432 and 12/27/2019 and 910 on 01/24/2020. Triglycerideswere 183 (<150) on 08/20/2016, 180 on 01/05/2017, 312 on 06/17/2017, 165 on 11/25/2017, 113 on 02/17/2018.  He has urinary retention. Renal ultrasoundon 07/28/2016 revealed bilateral hydronephrosis (right >left) and a large postvoid residual (2 liters) suggesting bladder dysfunction or outlet obstruction. He underwent temporary Foley catheter placement. He performs I/O self catheterizations. He is on Flomax and finasteride. PSAwas 2.02 on 07/09/2016.  He is followed by GI (Dr. Vira Agar) for a history of polypsand Crohn's disease.He had an unremarkable colonoscopy in 03/2017.AbdomenandpelvisCTon 04/03/2019 revealed mild wall thickening of  portions of a loop of small bowel within the pelvis with intervening areas of mildly dilated small bowel,consistent with active Crohn's disease. No evidence of an abscess or fistula. No other evidence of bowel inflammation.There was splenomegaly(16.7 x 7.7 x 15.6 cm), developed since12/11/2011.There was mild bladder wall thickeningandenlargement of the prostate. Chronic bladder outlet obstructionwas felt to bethe etiology of bladder wall thickening.  He has chronicrenal insufficiency(Cr 1.85; CrCl 36 ml/min). SPEP on 09/23/2016 was negative. Spot urine revealed 17.4% of 27.3 mg/dL of a monoclonal protein. 24 hour urine on 10/16/2016 revealed no monoclonal protein. Renal function transiently decreased in  06/2017 after Bactrim. 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  He is followed by nephrology and urology.  He has a history of epistaxis. Normal studies included: PT, PTT, von Willebrand panel, and platelet function assay (PFA).  He has right foot drop. 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 There was no non-contrast MRI evidence of brain metastasis. There was only mild for age nonspecific cerebral white signal changes. Lumbar spine MRIon 06/09/2019 showed diffusely abnormal marrow signal in the visible skeleton, as seen on the brain MRI. There was no pathologic fracture and no extraosseous extension of tumor on the lumbar spine or visible sacrum. In conjunction with the splenomegaly by CT in [place] infiltration byleukemia/lymphoma may be the most likely etiology. Differential considerations included multiple myeloma and metastatic disease unknown primary. The involvement of the pelvis should be amenable to bone marrow biopsy.There was superimposed mild for age lumbar spine degeneration with no spinal stenosis.There was mild degenerative L4 and L5 neural foraminal stenosis.  He was diagnosed with bilateral pneumonia  on 02/29/2020.  He is on Augmentin and azithromycin.    He received bothCOVID-19vaccinesin 10/2019 (first on 10/28/2019).  COVID-19 testing was negative on 02/29/2020.  Symptomatically, he has an ongoing cough and is on another round of azithromycin.  He is on hydroxyurea 500 mg a day.  He remains on fedratinib.    Plan: 1.   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  Bone marrow reveals progression of PV to AML.  Discuss treatment options proposed by [facility] in detail.  Patient unsure about direction of therapy. Hematocrit 28.9. Hemoglobin 8.9. Platelets401,000. PTW65,681 (EXN17,001) on 03/02/2020.   Peripheral smear revealed 18% blasts. He is currently on fedratinib.   Fedratinib dose decreased secondary to CrCl.   Discuss likely plan to taper to off.  He is on hydroxyurea 500 mg a day.   Review of [facility] notes indicate either 500 mg a day or 500 mg twice daily.   Contact [facility] directly for hydroxyurea dosing.   Patient was contacted after clinic to institute 500 mg twice daily.  Multiple questions were asked and answered. 2.Pneumonia Patient on another course of azithromycin.  Gust close monitoring secondary to AML and plan for treatment 3.   RTC in 1 week for MD assessment and +/- labs  I discussed the assessment and treatment plan with the patient.  The patient was provided an opportunity to ask questions and all were answered.  The patient agreed with the plan and demonstrated an understanding of the instructions.  The patient was advised to call back if the symptoms worsen or if the condition fails to improve as anticipated.  I provided 24 minutes of face-to-face time during this this encounter and > 50% was spent counseling as documented under my assessment and plan.  An additional 10 minutes were spent reviewing hiis chart ([place]) including notes, labs,  and imaging studies.  An additional 10 minutes were spent reviewing his chart (Epic and Care Everywhere) including notes, labs, and imaging studies.  Spoke with Dr. Adriana Simas at [facility] regarding his diagnosis of AML and upcoming plans.    Lequita Asal, MD, PhD    03/07/2020, 1:47 PM  I, Selena Batten, am acting as scribe for [employer]. Mike Gip, MD, PhD.  I, Othniel Maret C. Mike Gip, MD, have reviewed the above documentation for accuracy and completeness, and I agree with the above.

## 2020-03-06 ENCOUNTER — Other Ambulatory Visit: Admit: 2020-03-06 | Discharge: 2020-03-07 | Payer: MEDICARE

## 2020-03-06 ENCOUNTER — Ambulatory Visit: Admit: 2020-03-06 | Discharge: 2020-03-07 | Payer: MEDICARE | Attending: Internal Medicine | Primary: Internal Medicine

## 2020-03-06 DIAGNOSIS — C9200 Acute myeloblastic leukemia, not having achieved remission: Principal | ICD-10-CM

## 2020-03-06 DIAGNOSIS — J189 Pneumonia, unspecified organism: Principal | ICD-10-CM

## 2020-03-06 DIAGNOSIS — Z1159 Encounter for screening for other viral diseases: Principal | ICD-10-CM

## 2020-03-06 DIAGNOSIS — D471 Chronic myeloproliferative disease: Principal | ICD-10-CM

## 2020-03-06 DIAGNOSIS — R799 Abnormal finding of blood chemistry, unspecified: Principal | ICD-10-CM

## 2020-03-06 DIAGNOSIS — D7581 Myelofibrosis: Principal | ICD-10-CM

## 2020-03-06 LAB — LACTATE DEHYDROGENASE: Lactate dehydrogenase:CCnc:Pt:Ser/Plas:Qn:Reaction: pyruvate to lactate: 3620 — ABNORMAL HIGH

## 2020-03-06 LAB — COMPREHENSIVE METABOLIC PANEL
ALBUMIN: 3.7 g/dL (ref 3.5–5.0)
ALKALINE PHOSPHATASE: 100 U/L (ref 38–126)
ALT (SGPT): 13 U/L (ref <50)
ANION GAP: 6 mmol/L — ABNORMAL LOW (ref 7–15)
AST (SGOT): 72 U/L — ABNORMAL HIGH (ref 19–55)
BILIRUBIN TOTAL: 0.9 mg/dL (ref 0.0–1.2)
BLOOD UREA NITROGEN: 38 mg/dL — ABNORMAL HIGH (ref 7–21)
BUN / CREAT RATIO: 18
CALCIUM: 8.9 mg/dL (ref 8.5–10.2)
CHLORIDE: 104 mmol/L (ref 98–107)
CO2: 24 mmol/L (ref 22.0–30.0)
EGFR CKD-EPI AA MALE: 34 mL/min/{1.73_m2} — ABNORMAL LOW (ref >=60)
EGFR CKD-EPI NON-AA MALE: 30 mL/min/{1.73_m2} — ABNORMAL LOW (ref >=60)
POTASSIUM: 5.5 mmol/L — ABNORMAL HIGH (ref 3.5–5.0)
PROTEIN TOTAL: 8.2 g/dL (ref 6.5–8.3)
SODIUM: 134 mmol/L — ABNORMAL LOW (ref 135–145)

## 2020-03-06 LAB — CBC W/ AUTO DIFF
HEMATOCRIT: 31.5 % — ABNORMAL LOW (ref 41.0–53.0)
HEMOGLOBIN: 9.8 g/dL — ABNORMAL LOW (ref 13.5–17.5)
MEAN CORPUSCULAR HEMOGLOBIN CONC: 31.1 g/dL (ref 31.0–37.0)
MEAN CORPUSCULAR HEMOGLOBIN: 26.8 pg (ref 26.0–34.0)
MEAN CORPUSCULAR VOLUME: 86.3 fL (ref 80.0–100.0)
MEAN PLATELET VOLUME: 9.6 fL (ref 7.0–10.0)
PLATELET COUNT: 387 10*9/L (ref 150–440)
RED BLOOD CELL COUNT: 3.65 10*12/L — ABNORMAL LOW (ref 4.50–5.90)
RED CELL DISTRIBUTION WIDTH: 20.3 % — ABNORMAL HIGH (ref 12.0–15.0)
WBC ADJUSTED: 53.2 10*9/L (ref 4.5–11.0)

## 2020-03-06 LAB — ANION GAP: Anion gap 3:SCnc:Pt:Ser/Plas:Qn:: 6 — ABNORMAL LOW

## 2020-03-06 LAB — HEPATITIS B SURFACE ANTIBODY QUANT: Hepatitis B virus surface Ab:ACnc:Pt:Ser:Qn:: <8

## 2020-03-06 LAB — URIC ACID: Urate:MCnc:Pt:Ser/Plas:Qn:: 6.9

## 2020-03-06 LAB — SPECIAL COLLECTION

## 2020-03-06 LAB — HEPATITIS C ANTIBODY: Hepatitis C virus Ab:PrThr:Pt:Ser:Ord:: NONREACTIVE

## 2020-03-06 LAB — COLLECTION

## 2020-03-06 LAB — FERRITIN: Ferritin:MCnc:Pt:Ser/Plas:Qn:: 141

## 2020-03-06 LAB — HEPATITIS B SURFACE ANTIGEN: Hepatitis B virus surface Ag:PrThr:Pt:Ser:Ord:: NONREACTIVE

## 2020-03-06 LAB — HEPATITIS B CORE TOTAL ANTIBODY: Hepatitis B virus core Ab:PrThr:Pt:Ser/Plas:Ord:IA: NONREACTIVE

## 2020-03-06 MED ORDER — AZITHROMYCIN 250 MG TABLET
ORAL_TABLET | Freq: Every day | ORAL | 0 refills | 6 days | Status: CP
Start: 2020-03-06 — End: ?

## 2020-03-06 MED ORDER — VENETOCLAX 100 MG TABLET
ORAL_TABLET | Freq: Every day | ORAL | 1 refills | 30 days | Status: CP
Start: 2020-03-06 — End: ?
  Filled 2020-03-30: qty 120, 30d supply, fill #0

## 2020-03-06 MED ORDER — HYDROXYUREA 500 MG CAPSULE
ORAL_CAPSULE | Freq: Every day | ORAL | 2 refills | 60.00000 days | Status: CP
Start: 2020-03-06 — End: ?

## 2020-03-06 NOTE — Unmapped (Unsigned)
Malignant Hematology Established Visit      Patient: Howard Villarreal  MRN: [id number]  DOB: October 04, 1945  Date of Visit: 03/06/2020    Local Oncologist: Dr. Nelva Nay    Reason for Visit   Howard THORINGTON is a 74 y.o. with post-PV MF here for urgent visit due to concern for progression to AML.     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     Overall survival for AML arising from myelofibrosis is <6 months.  The best chance for cure and improved long-term survival is allogeneic stem cell transplant. (Blood. 2020;136(1):61-70)    Options:  1. Do nothing - focus on quality of life.  Estimated <6 months. Could have blood transfusions or opt to not have transfusions.   2. Intensive chemotherapy - admitted to hospital for ~1-2 months for CPX-351 chemotherapy.  Reports of 70% complete response, with 42-month overall survival of 70%. Renal function presently borderline (GFR 30 ml/min). [email or web address].[email or web address]  3. Lower intensity chemotherapy - anticipate outpatient treatment: decitabine (infusion) + venetoclax (pill).  Complete response rate of 50%, median overall survival 9 months.  DOI 10.1182/bloodadvances.5784696295.  Would favor azacitidine instead.   4. Other - do not yet have mutation status (such as for FLT3 - midostaurin)    If you were able to achieve and maintain a complete response for long enough, allogeneic stem cell transplantation can be considered. Reported outcomes are variable, with 30-75% 2-year overall survival reported (versus 0-15% with chemotherapy alone).  Currently, renal function and performance status are not acceptable for ASCT - unclear how much these will improve. Mr. Marez indicated that he is primarily focused on quality of life.  He will consider the treatment options outlined and will meet with [name] next week.  In the meantime, peripheral blood flow cytometry, MMP, karyotype are pending for better understanding of targeted therapies for sAML.  He will start hydroxyurea in an attempt to reduce WBC to <10 x 10^9/L in anticipation of venetoclax.  Will give an additional burst of azithromycin given he had significant improvement with it - to complete along with the amoxicillin-clavulanate course.       Plan   1. Add azithromycin (500mg  po Day 1 followed by 250mg  po daily Days 2-5) to amoxicillin-clavulanate to finish CAP course.  If no improvement, would get a chest CT w/o contrast and would consider alternative therapy with respiratory fluoroquinolone.   2. Begin hydroxyurea 500mg  po BID.  Target WBC <10.  Will need weekly CBC-D.    Transfusion parameters: hgb <8g/dL, platelets <28  3. Continue allopurinol 150mg  po daily  4. Await results of flow, MMP, karyotype  5. Anticipate starting azacitidine + venetoclax.  In order to prevent delays, while awaiting additional studies, will begin PA process and scheduling.  Signature waiver form obtained.   6. Taper fedratinib: Decrease to 100mg  po daily x 5 days, then stop.   7. Follow-up here in 1 week, anticipating treatment decision at that point.  Will be with [name].     Rozetta Nunnery, M.D.  Assistant Professor  Division of Hematology    Co-PI, [facility]  Member, [facility]  Member, [facility]      [address]  [place] [address]  [address]  [place] [address]    Questions and appointments M-F 8am - 5pm: [phone] or [phone]    Myeloproliferative Neoplasms & Bone Marrow Failure Clinical Team  Nurse Navigator: Wynona Meals   New Patient Intake Coordinator: Glenford Bayley  Return Patient Scheduler: Laurance Flatten    Nurse Practitioner: [name], AGNP  Clinical Pharmacist: Audie Box, CPP  Medication Access Specialist: Yetta Barre      Interval History:  Had pneumonia a few weeks ago and received antibiotics with resolution.   Was noted to have falling hemoglobin despite fedratinib dose reductions and a bone marrow biopsy was reportedly done at [facility] which was a dry tap.  Reportedly with 26% blasts in the periperhy: the final pathology report will not be released to [place] at this time as it is incomplete.     Started azithromycin + amoxicillin-clavulanate on 6/10 - felt much better on azithromycin.     Otherwise, denies new bone pain, fevers, chills, night sweats, lumps/bumps, tongue swelling, shortness of breath, syncope, lightheadedness, constipation or diarrhea, nausea or vomiting, very easy bruising or bleeding, or urinary changes.       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 He has had polycythemia since at least 2013 (no labs prior to that, but on 08/24/12, WBC 21, Hgb 19.3, Hct 59.8%, Plts 551). Hematocrit was 62.1 with a hemoglobin of 19.8 on 03/28/2015. 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 Erythropoietin level was 1.1 (low).     He began a phlebotomy program on 03/28/2015 when he was seen in consultation by Dr. Merlene Pulling and diagnosed with PV to maintain a hematocrit goal of < 45%; he was likely iron deficient at diagnosis, with MCV 76fL.   He last underwent phlebotomy on 05/02/2015. He is maintained on aspirin 81mg  po daily without significant bleeding nor dyspepsia.     He is followed closely by GI (Dr. Mechele Collin) for a history of polyps and Crohn's disease. Last colonoscopy was 2012. He has become iron deficient on his phlebotomy program (ferritin 21 on 11/28/2015).     CBC on 11/28/2015 revealed a hematocrit of 39.3, hemoglobin 11.9, platelets 463,000, WBC 32,000 with an ANC of 25,000. Differential included 71% segs, 4% lymphocytes and 17% monocytes. Peripheral smear revealed leukocytosis with predominantly mature neutrophils, increased monocytes and rare blasts (<1%).    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 Marrow was packed (95-100% cellularity) with pan myelosis, multi-lineage dyspoiesis, and no significant increase in blasts. There was moderate to focally marked reticulin fibrosis (grade 2-3/3). Storage iron was not identified. Flow cytometry revealed non-specific atypical myeloid findings with no increase in blasts. 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 Cytogenetics were normal (46, XY).    He was briefly on hydroxyurea 500 mg a day (12/05/2015 - 12/19/2015). He began allopurinol on 12/19/2015. Uric acid has decreased from 12 to 5.8 to 5.1. Creatinine is stable at 1.62 (CrCl 42 ml/min). LDH was 466 on 01/02/2016. Hematocrit is drifting down (36.9 to 35.4). Platelets are 228,000 and WBC of 21,700.    [facility] review of the bmbx:  Morphologic evaluation, together with review of outside reports of flow cytometry, cytogenetics, and our review  of outside special stains, support the above interpretation. Morphologic evaluation of the aspirate smear may be limited by suboptimal/weak staining. While the aspirate differential count does not reflect an increase in blasts, there are scattered immature appearing mononuclear cells that may represent hypogranular and/or maturing granulocytes. Megakaryocytes are dysplastic, showing numerous hypo-loabted and multi-nucleated forms as would be seen in myelodysplastic syndrome, with relatively infrequent hyper-lobated forms. Erythroid precursors show at least mild dysplasia. Granulocytic precursors may show hypogranularity. The bone marrow biopsy demonstrates a hypercellular marrow with increased reticulin fibrosis. Overall, while it is difficult to exclude a post-polycythemic myelofibrosis with progression to dysplasia given the patient's history, an overlap myeloproliferative/myedysplastic neoplasm would also be in the differential. In addition, flow cytometric, CD34 immunohistochemistry on the biopsy and marrow aspirate differential show no definitive increase in blasts, the possible suboptimal staining of the aspirate smear and/or true hypogranularity of the granulocytes may lead to an underestimate of these forms. Repeat marrow evaluation and correlation with myeloid mutation panel (MDS/MPN) is recommended.??    His bone marrow biopsy from 04/07/16 showed a >95% cellular marrow with 5% blasts by manual aspirate differential, mild reticulin fibrosis (but not enough to classify as MF), mild dyserythropoiesis, hypogranular neutrophils (but not enough to classify as MDS) and molecular mutations in: JAK2 V617F, IDH2, RUNX1, SRSF2.  Mutations in RUNX1 and SRSF2 are associated with poorer prognosis and shorter survival in MPN.  However, median overall survival with this is still reasonably long, estimated at 10 years assuming one still classifies this as PV, which technically it is  [https://ash.[email or web address]/[email or web address].html].  I do think what we are seeing is PV evolving to MF (especially given the prior outside bmbx that showed moderated to marked fibrotic changes, but only minimal on our sampling plus the presence of nucleated RBCs in the periphery and elevated LDH.    Assuming this were MF, he would be classified as Intermediate-2 risk by DIPPS and the same by DIPPS Plus, noting that he does have a higher-risk mutational profile with RUNX1 and SRSF2 (though only ASXL1 remained significant in the follow-up analyses of molecular markers).      Review of Systems     Wt Readings from Last 12 Encounters:   03/06/20 76.2 kg (168 lb 1.6 oz)   07/14/19 76 kg (167 lb 8 oz) 07/14/19 75.9 kg (167 lb 5.3 oz)   07/11/19 75.8 kg (167 lb 3.2 oz)   08/09/18 84.8 kg (187 lb 1 oz)   07/12/18 83.9 kg (184 lb 15.5 oz)   01/04/18 86 kg (189 lb 8 oz)   07/06/17 83.7 kg (184 lb 9.6 oz)   12/29/16 84.3 kg (185 lb 14.4 oz)   08/04/16 79.4 kg (175 lb 1.6 oz)   05/05/16 80.3 kg (177 lb)   04/07/16 79.9 kg (176 lb 4.1 oz)     + unintentional weight loss  Very fatigued - has not excerised much at all.  Did make it one mile a few weeks ago.  Has had a pneumonia ~3 months with coughing.  Took azithromycin last week with improvement and now amoxicillin.    (-) fever  (-) night sweats  Intermittent appetite - not clear if is related to early satiety.   No abdominal pain.   Sometimes swollen feet.  Had improved with ruxolitinib but not fedratinib.   No orthopnea. But is not able to speak in full sentences.     Urinary catheter - has been present for a long time.  BPH.    No falls.  No mucocutaneous bleeding, but is bruising easily. Had nose bleeding cauterized and hasn't returned.         10 systems reviewed and negative except as noted in the HPI and Interval History.     Medications     Current Outpatient Medications   Medication Sig Dispense Refill   ??? acetaminophen (TYLENOL) 500 MG tablet Take 500 mg by mouth every eight (8) hours as needed.     ??? allopurinoL (ZYLOPRIM) 300 MG tablet Take 150 mg by mouth daily.      ??? amoxicillin-clavulanate (AUGMENTIN) 875-125 mg per tablet Take 1 tablet by mouth.     ??? clobetasoL (TEMOVATE) 0.05 % ointment      ??? cyanocobalamin 1000 MCG tablet Take 1,000 mcg by mouth daily.      ??? fedratinib (INREBIC) 100 mg cap Take 4 capsules (400 mg) by mouth daily. 120 capsule 2   ??? finasteride (PROSCAR) 5 mg tablet Take 5 mg by mouth daily with evening meal.     ??? latanoprost (XALATAN) 0.005 % ophthalmic solution 1 drop nightly.     ??? sodium bicarbonate 650 mg tablet Take 650 mg by mouth Two (2) times a day.     ??? tamsulosin (FLOMAX) 0.4 mg capsule Comments:  - Filled Date: Aug 24 2016 12:00AM - Patient Notes: TAKE 1 CAPSULE BY MOUTH DAILY   Duration: 30     ??? timolol (BETIMOL) 0.5 % ophthalmic solution 1 drop Two (2) times a day.     ??? timolol (TIMOPTIC) 0.5 % ophthalmic solution Apply 1 drop to eye daily.     ??? aspirin 81 MG chewable tablet Chew 81 mg. (Patient not taking: Reported on 03/06/2020)     ??? azithromycin (ZITHROMAX) 250 MG tablet Take 1 tablet (250 mg total) by mouth daily. Take 2 tablets (500mg ) on Day 1 and 1 tablet days 2-5. 6 tablet 0   ??? hydroxyurea (HYDREA) 500 mg capsule Take 1 capsule (500 mg total) by mouth daily. 60 capsule 2   ??? neomycin-polymyxin-dexamethasone (MAXITROL) 3.5mg /mL-10,000 unit/mL-0.1 % ophthalmic suspension APPLY 1 DROP INTO RIGHT EYE THREE TIMES A DAY (Patient not taking: Reported on 03/06/2020)       No current facility-administered medications for this visit.         Allergies   No Known Allergies    Past Medical and Surgical History     Past Medical History:   Diagnosis Date   ??? 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 CKD (chronic kidney disease) stage 3, GFR 30-59 ml/min    ??? Crohn's colitis (CMS-HCC) 1986    No surgeries. Was on 6-MP for awhile; hasn't been on anything 2005.   ??? Glaucoma    ??? 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      Past Surgical History:   Procedure Laterality Date   ??? MOHS SURGERY  2016    left ear for non-melanoma skin cancer       Social History     Social History     Socioeconomic History   ??? Marital status: Married     Spouse name: Not on file   ??? Number of children: Not on file   ??? Years of education: Not on file   ??? Highest education level: Not on file   Occupational History   ??? Not on file   Tobacco Use   ??? Smoking status: Former Smoker   ??? Smokeless tobacco: Never  Used   Vaping Use   ??? Vaping Use: Never used   Substance and Sexual Activity   ??? Alcohol use: Yes     Alcohol/week: 4.0 standard drinks     Types: 4 Glasses of wine per week     Comment: occasional    ??? Drug use: Not on file   ??? Sexual activity: Not on file   Other Topics Concern   ??? Not on file   Social History Narrative   ??? Not on file     Social Determinants of Health     Financial Resource Strain:    ??? Difficulty of Paying Living Expenses:    Food Insecurity:    ??? Worried About Programme researcher, broadcasting/film/video in the Last Year:    ??? Barista in the Last Year:    Transportation Needs:    ??? Freight forwarder (Medical):    ??? Lack of Transportation (Non-Medical):    Physical Activity:    ??? Days of Exercise per Week:    ??? Minutes of Exercise per Session:    Stress:    ??? Feeling of Stress :    Social Connections:    ??? Frequency of Communication with Friends and Family:    ??? Frequency of Social Gatherings with Friends and Family:    ??? Attends Religious Services:    ??? Database administrator or Organizations:    ??? Attends Banker Meetings:    ??? Marital Status:    Former Audiological scientist; retired several years ago.  Lives with his wife.        Physical Examination     Vitals:    03/06/20 0806   BP: 134/72   Pulse: 86   Resp: 16   Temp: 37.2 ??C (99 ??F)   TempSrc: Oral   SpO2: 99%   Weight: 76.2 kg (168 lb 1.6 oz)   Height: 182.9 cm (6')     GENERAL:  Quite thin - weight was recorded at 168 here, but 153 at home.  His waist is 32. Accompanied by his wife.  In no distress.  Pleasant and alert.   RESP: Breathing comfortably.  Speaking comfortably in full sentences.   During the 60 minute visit, 2 episodes of ~3 seconds of non-productive cough.    NEURO: A&Ox4.  CN II-XII grossly intact and symmetric.   PSYCH: Normal affect, mood is so so.  Forward thinking.  Goal-directed and linear.   EXT:  1+ pedal edema bilaterally.   SKIN:  Some bruising on arms, but no petechiae, no rashes.   .     Laboratory Testing and Imaging     Lab on 03/06/2020   Component Date Value Ref Range Status   ??? Sodium 03/06/2020 134* 135 - 145 mmol/L Final   ??? Potassium 03/06/2020 5.5* 3.5 - 5.0 mmol/L Final   ??? Chloride 03/06/2020 104 98 - 107 mmol/L Final   ??? Anion Gap 03/06/2020 6* 7 - 15 mmol/L Final   ??? CO2 03/06/2020 24.0  22.0 - 30.0 mmol/L Final   ??? BUN 03/06/2020 38* 7 - 21 mg/dL Final   ??? Creatinine 03/06/2020 2.14* 0.70 - 1.30 mg/dL Final   ??? BUN/Creatinine Ratio 03/06/2020 18   Final   ??? EGFR CKD-EPI Non-African American,* 03/06/2020 30* >=60 mL/min/1.55m2 Final   ??? EGFR CKD-EPI African American, Male 03/06/2020 34* >=60 mL/min/1.82m2 Final   ??? Glucose 03/06/2020 77  70 - 179 mg/dL Final   ???  Calcium 03/06/2020 8.9  8.5 - 10.2 mg/dL Final   ??? Albumin 16/06/9603 3.7  3.5 - 5.0 g/dL Final   ??? Total Protein 03/06/2020 8.2  6.5 - 8.3 g/dL Final   ??? Total Bilirubin 03/06/2020 0.9  0.0 - 1.2 mg/dL Final   ??? AST 54/05/8118 72* 19 - 55 U/L Final   ??? ALT 03/06/2020 13  <50 U/L Final   ??? Alkaline Phosphatase 03/06/2020 100  38 - 126 U/L Final   ??? Ferritin 03/06/2020 141.0  27.0 - 377.0 ng/mL Final   ??? LDH 03/06/2020 3,620* 338 - 610 U/L Final   ??? Uric Acid 03/06/2020 6.9  4.0 - 9.0 mg/dL Final   ??? Special Collection 03/06/2020 Collected   Final   ??? WBC 03/06/2020 53.2* 4.5 - 11.0 10*9/L Preliminary   ??? RBC 03/06/2020 3.65* 4.50 - 5.90 10*12/L Preliminary   ??? HGB 03/06/2020 9.8* 13.5 - 17.5 g/dL Preliminary   ??? HCT 03/06/2020 31.5* 41.0 - 53.0 % Preliminary   ??? MCV 03/06/2020 86.3  80.0 - 100.0 fL Preliminary   ??? MCH 03/06/2020 26.8  26.0 - 34.0 pg Preliminary   ??? MCHC 03/06/2020 31.1  31.0 - 37.0 g/dL Preliminary   ??? RDW 03/06/2020 20.3* 12.0 - 15.0 % Preliminary   ??? MPV 03/06/2020 9.6  7.0 - 10.0 fL Preliminary   ??? Platelet 03/06/2020 387  150 - 440 10*9/L Preliminary   ??? Variable HGB Concentration 03/06/2020 Slight* Not Present Preliminary   ??? Microcytosis 03/06/2020 Moderate* Not Present Preliminary   ??? Anisocytosis 03/06/2020 Moderate* Not Present Preliminary   ??? Hypochromasia 03/06/2020 Marked* Not Present Preliminary   ??? Collection 03/06/2020 Collected   Final

## 2020-03-06 NOTE — Unmapped (Incomplete)
Error

## 2020-03-06 NOTE — Unmapped (Addendum)
Concern for acute leukemia (AML) based on falling hemoglobin and 26% blasts reportedly seen from peripheral blood testing at [facility] (records are not yet available).     Overall survival for AML arising from myelofibrosis is <6 months.  The best chance for cure and improved long-term survival is allogeneic stem cell transplant.    Options:  1. Do nothing - focus on quality of life.  Estimated <6 months. Could have blood transfusions or opt to not have transfusions.   2. Intensive chemotherapy - admitted to hospital for ~1-2 months for CPX-351 chemotherapy.  Reports of 70% complete response, with 20-month overall survival of 70%.   3. Lower intensity chemotherapy - anticipate outpatient treatment: decitabine (infusion) + venetoclax (pill).  Complete response rate of 50%, median overall survival 9 months.     If you were able to achieve and maintain a complete response for long enough, allogeneic stem cell transplantation can be considered. Reported outcomes are variable, with 30-75% 2-year overall survival reported (versus 0-15% with chemotherapy alone).      Plan:  1. Additional AML testing has been obtained here from your blood draw today to look for (1) blast count, (2) mutations.   2. Start hydroxyurea 500mg  by mouth twice daily.   3. Labs and transfusion weekly.  Transfuse blood for hemoglobin <8 g/dL.   4. Consider allogeneic stem cell transplantation  5. Restart azithromycin 500mg  (2 tablets), then 250mg  (1tablet) on days 2-5.    6. Continue allopurinol 150mg  by mouth once daily.     Rozetta Nunnery, M.D.  Assistant Professor  Division of Hematology    Co-PI, [facility]  Member, [employer]  Member, [facility]      [address]  [place] [address]  [address]  [place] [address]    Questions and appointments M-F 8am - 5pm: [phone] or [phone]    Myeloproliferative Neoplasms & Bone Marrow Failure Clinical Team  Nurse Navigator: Wynona Meals  New Patient Intake Coordinator: Glenford Bayley  Return Patient Scheduler: Laurance Flatten    Nurse Practitioner: [name], AGNP  Clinical Pharmacist: Audie Box, CPP  Medication Access Specialist: Yetta Barre

## 2020-03-06 NOTE — Unmapped (Unsigned)
Labs drawn peripherally by Adam A.

## 2020-03-07 ENCOUNTER — Encounter: Payer: Self-pay | Admitting: Hematology and Oncology

## 2020-03-07 ENCOUNTER — Inpatient Hospital Stay (HOSPITAL_BASED_OUTPATIENT_CLINIC_OR_DEPARTMENT_OTHER): Payer: Medicare Other | Admitting: Hematology and Oncology

## 2020-03-07 ENCOUNTER — Other Ambulatory Visit: Payer: Self-pay

## 2020-03-07 VITALS — BP 127/70 | HR 80 | Temp 96.5°F | Resp 16 | Wt 167.2 lb

## 2020-03-07 DIAGNOSIS — D649 Anemia, unspecified: Secondary | ICD-10-CM | POA: Diagnosis not present

## 2020-03-07 DIAGNOSIS — D45 Polycythemia vera: Secondary | ICD-10-CM | POA: Diagnosis not present

## 2020-03-07 DIAGNOSIS — N289 Disorder of kidney and ureter, unspecified: Secondary | ICD-10-CM | POA: Diagnosis not present

## 2020-03-07 DIAGNOSIS — E871 Hypo-osmolality and hyponatremia: Secondary | ICD-10-CM | POA: Diagnosis not present

## 2020-03-07 DIAGNOSIS — D471 Chronic myeloproliferative disease: Secondary | ICD-10-CM

## 2020-03-07 DIAGNOSIS — C9200 Acute myeloblastic leukemia, not having achieved remission: Secondary | ICD-10-CM | POA: Diagnosis not present

## 2020-03-07 DIAGNOSIS — J189 Pneumonia, unspecified organism: Secondary | ICD-10-CM | POA: Diagnosis not present

## 2020-03-07 DIAGNOSIS — Z7189 Other specified counseling: Secondary | ICD-10-CM

## 2020-03-07 LAB — MANUAL DIFFERENTIAL
BASOPHILS - ABS (DIFF): 1.6 10*9/L — ABNORMAL HIGH (ref 0.0–0.1)
BASOPHILS - REL (DIFF): 3 %
BLASTS - REL (DIFF): 17 % (ref <=0)
EOSINOPHILS - REL (DIFF): 1 %
LYMPHOCYTES - ABS (DIFF): 3.7 10*9/L (ref 1.5–5.0)
LYMPHOCYTES - REL (DIFF): 7 %
MONOCYTES - ABS (DIFF): 3.7 10*9/L — ABNORMAL HIGH (ref 0.2–0.8)
MONOCYTES - REL (DIFF): 7 %
NEUTROPHILS - ABS (DIFF): 34.6 10*9/L — ABNORMAL HIGH (ref 2.0–7.5)

## 2020-03-07 LAB — CBC W/ AUTO DIFF
HEMOGLOBIN: 9.8 g/dL — ABNORMAL LOW (ref 13.5–17.5)
MEAN CORPUSCULAR HEMOGLOBIN: 26.8 pg (ref 26.0–34.0)
MEAN CORPUSCULAR VOLUME: 86.3 fL (ref 80.0–100.0)
MEAN PLATELET VOLUME: 9.6 fL (ref 7.0–10.0)
NUCLEATED RED BLOOD CELLS: 3 /100{WBCs} (ref <=4)
PLATELET COUNT: 387 10*9/L (ref 150–440)
RED BLOOD CELL COUNT: 3.65 10*12/L — ABNORMAL LOW (ref 4.50–5.90)
RED CELL DISTRIBUTION WIDTH: 20.3 % — ABNORMAL HIGH (ref 12.0–15.0)
WBC ADJUSTED: 53.2 10*9/L (ref 4.5–11.0)

## 2020-03-07 LAB — WBC ADJUSTED: Leukocytes:NCnc:Pt:Bld:Qn:: 53.2

## 2020-03-07 LAB — NEUTROPHILS - REL (DIFF): Neutrophils/100 leukocytes:NFr:Pt:Bld:Qn:Manual count: 65

## 2020-03-07 LAB — PATHOLOGIST SMEAR INTERPRETATION

## 2020-03-07 NOTE — Progress Notes (Signed)
Decreased appetite. Cough is getting better. Has occasional numbness and tingling in the soles of feet. Wife is present.

## 2020-03-08 ENCOUNTER — Encounter (HOSPITAL_COMMUNITY): Payer: Self-pay | Admitting: Hematology and Oncology

## 2020-03-08 ENCOUNTER — Encounter: Payer: Self-pay | Admitting: Hematology and Oncology

## 2020-03-09 DIAGNOSIS — D471 Chronic myeloproliferative disease: Principal | ICD-10-CM

## 2020-03-09 DIAGNOSIS — C9200 Acute myeloblastic leukemia, not having achieved remission: Principal | ICD-10-CM

## 2020-03-09 LAB — SURGICAL PATHOLOGY

## 2020-03-09 NOTE — Unmapped (Signed)
I LVM with patient Howard Villarreal to confirm appointments on the following date(s): 6/21 Labs/[place], 6/23 PICC line insertion    Gina L Powe

## 2020-03-12 ENCOUNTER — Other Ambulatory Visit: Admit: 2020-03-12 | Discharge: 2020-03-13 | Payer: MEDICARE

## 2020-03-12 ENCOUNTER — Ambulatory Visit: Admit: 2020-03-12 | Discharge: 2020-03-13 | Payer: MEDICARE | Attending: Adult Health | Primary: Adult Health

## 2020-03-12 ENCOUNTER — Encounter: Payer: Self-pay | Admitting: Hematology and Oncology

## 2020-03-12 DIAGNOSIS — D471 Chronic myeloproliferative disease: Principal | ICD-10-CM

## 2020-03-12 DIAGNOSIS — C9200 Acute myeloblastic leukemia, not having achieved remission: Principal | ICD-10-CM

## 2020-03-12 LAB — COMPREHENSIVE METABOLIC PANEL
ALBUMIN: 3.5 g/dL (ref 3.5–5.0)
ALKALINE PHOSPHATASE: 114 U/L (ref 38–126)
ALT (SGPT): 10 U/L (ref <50)
ANION GAP: 7 mmol/L (ref 7–15)
AST (SGOT): 55 U/L (ref 19–55)
BILIRUBIN TOTAL: 0.8 mg/dL (ref 0.0–1.2)
BLOOD UREA NITROGEN: 32 mg/dL — ABNORMAL HIGH (ref 7–21)
BUN / CREAT RATIO: 17
CALCIUM: 8.6 mg/dL (ref 8.5–10.2)
CHLORIDE: 105 mmol/L (ref 98–107)
CO2: 22 mmol/L (ref 22.0–30.0)
CREATININE: 1.86 mg/dL — ABNORMAL HIGH (ref 0.70–1.30)
EGFR CKD-EPI AA MALE: 41 mL/min/{1.73_m2} — ABNORMAL LOW (ref >=60)
EGFR CKD-EPI NON-AA MALE: 35 mL/min/{1.73_m2} — ABNORMAL LOW (ref >=60)
GLUCOSE RANDOM: 91 mg/dL (ref 70–179)
POTASSIUM: 5 mmol/L (ref 3.5–5.0)
PROTEIN TOTAL: 8.1 g/dL (ref 6.5–8.3)

## 2020-03-12 LAB — MANUAL DIFFERENTIAL
BASOPHILS - ABS (DIFF): 2 10*9/L — ABNORMAL HIGH (ref 0.0–0.1)
BASOPHILS - REL (DIFF): 3 %
BLASTS - REL (DIFF): 19 % (ref <=0)
EOSINOPHILS - ABS (DIFF): 0 10*9/L (ref 0.0–0.4)
LYMPHOCYTES - ABS (DIFF): 3.4 10*9/L (ref 1.5–5.0)
LYMPHOCYTES - REL (DIFF): 5 %
MONOCYTES - ABS (DIFF): 5.4 10*9/L — ABNORMAL HIGH (ref 0.2–0.8)
MONOCYTES - REL (DIFF): 8 %
NEUTROPHILS - ABS (DIFF): 43.6 10*9/L — ABNORMAL HIGH (ref 2.0–7.5)
NEUTROPHILS - REL (DIFF): 65 %

## 2020-03-12 LAB — CBC W/ AUTO DIFF
HEMATOCRIT: 33.1 % — ABNORMAL LOW (ref 41.0–53.0)
HEMOGLOBIN: 9.9 g/dL — ABNORMAL LOW (ref 13.5–17.5)
MEAN CORPUSCULAR HEMOGLOBIN CONC: 30 g/dL — ABNORMAL LOW (ref 31.0–37.0)
MEAN CORPUSCULAR HEMOGLOBIN: 26 pg (ref 26.0–34.0)
MEAN CORPUSCULAR VOLUME: 86.6 fL (ref 80.0–100.0)
MEAN PLATELET VOLUME: 9.8 fL (ref 7.0–10.0)
NUCLEATED RED BLOOD CELLS: 1 /100{WBCs} (ref <=4)
PLATELET COUNT: 369 10*9/L (ref 150–440)
RED BLOOD CELL COUNT: 3.82 10*12/L — ABNORMAL LOW (ref 4.50–5.90)
RED CELL DISTRIBUTION WIDTH: 20 % — ABNORMAL HIGH (ref 12.0–15.0)
WBC ADJUSTED: 67 10*9/L (ref 4.5–11.0)

## 2020-03-12 LAB — CHLORIDE: Chloride:SCnc:Pt:Ser/Plas:Qn:: 105

## 2020-03-12 LAB — NEUTROPHILS - ABS (DIFF): Neutrophils:NCnc:Pt:Bld:Qn:: 43.6 — ABNORMAL HIGH

## 2020-03-12 LAB — MEAN PLATELET VOLUME: Platelet mean volume:EntVol:Pt:Bld:Qn:Automated count: 9.8

## 2020-03-12 NOTE — Unmapped (Signed)
It was a pleasure to see you again.  Here is your plan from today:    1. Increase your hydroxyurea to 1000mg  (2 caps) twice daily  2. We will wait on PICC line placement and therapy until you are ready.  3. Recommend chest CT at [facility] to evaluate that pneumonia  4. Stop fedratinib  5. Check your labs in [facility] week [facility]  6. Let us know when you are ready to start therapy.    Any temperature over 100.4 is a fever and is an emergency.  Please go to the closest ER.      If you have any questions, please do not hesitate to contact us.    If you experience new or worsening of the following symptoms, please call:  1. Night sweats  2. Abdominal discomfort in the left upper quadrant  3. Feeling full early  4. Excessive fatigue  5. Unintentional weight loss  6. Bone pain  7. Itching after hot showers    When reviewing your results, please remember that the results of many of the tests we order can vary somewhat and that variation often means nothing.  Sometimes when we get results back after your clinic visit, if it looks like there???s some variation of that type, we may decide to recheck things sooner than we discussed in clinic.  If you get a call that we want to recheck things sooner, do not panic. It does not mean that things are going wrong.    For appointments & questions Monday through Friday 8 AM??? 5 PM   please call [phone] or Toll free [phone].    On Nights, Weekends and Holidays  Call [phone] and ask for the adult hematologist/oncologist on call.    [name], AGPCNP-C, MSN, OCN  Nurse Practitioner  Hematologic Malignancies  [facility]    Nurse Navigator: Wynona Meals RN  Questions and appointments M-F 8am - 5pm: [phone] or [phone]    [facility]  [address]  [place], [place] [address]  [email or web address]    Results for orders placed or performed in visit on 03/12/20   Comprehensive Metabolic Panel   Result Value Ref Range    Sodium 134 (L) 135 - 145 mmol/L    Potassium 5.0 3.5 - 5.0 mmol/L    Chloride 105 98 - 107 mmol/L    Anion Gap 7 7 - 15 mmol/L    CO2 22.0 22.0 - 30.0 mmol/L    BUN 32 (H) 7 - 21 mg/dL    Creatinine 3.66 (H) 0.70 - 1.30 mg/dL    BUN/Creatinine Ratio 17     EGFR CKD-EPI Non-African American, Male 35 (L) >=60 mL/min/1.59m2    EGFR CKD-EPI African American, Male 41 (L) >=60 mL/min/1.43m2    Glucose 91 70 - 179 mg/dL    Calcium 8.6 8.5 - 44.0 mg/dL    Albumin 3.5 3.5 - 5.0 g/dL    Total Protein 8.1 6.5 - 8.3 g/dL    Total Bilirubin 0.8 0.0 - 1.2 mg/dL    AST 55 19 - 55 U/L    ALT 10 <50 U/L    Alkaline Phosphatase 114 38 - 126 U/L   CBC w/ Differential   Result Value Ref Range    WBC 67.0 (HH) 4.5 - 11.0 10*9/L    RBC 3.82 (L) 4.50 - 5.90 10*12/L    HGB 9.9 (L) 13.5 - 17.5 g/dL    HCT 34.7 (L) 42.5 - 53.0 %  MCV 86.6 80.0 - 100.0 fL    MCH 26.0 26.0 - 34.0 pg    MCHC 30.0 (L) 31.0 - 37.0 g/dL    RDW 16.1 (H) 09.6 - 15.0 %    MPV 9.8 7.0 - 10.0 fL    Platelet 369 150 - 440 10*9/L    nRBC 1 <=4 /100 WBCs    Variable HGB Concentration Slight (A) Not Present    Microcytosis Slight (A) Not Present    Anisocytosis Moderate (A) Not Present    Hypochromasia Marked (A) Not Present   Manual Differential   Result Value Ref Range    Neutrophils % 65 %    Lymphocytes % 5 %    Monocytes % 8 %    Eosinophils % 0 %    Basophils % 3 %    Blasts % 19 (HH) <=0 %    Absolute Neutrophils 43.6 (H) 2.0 - 7.5 10*9/L    Absolute Lymphocytes 3.4 1.5 - 5.0 10*9/L    Absolute Monocytes 5.4 (H) 0.2 - 0.8 10*9/L    Absolute Eosinophils 0.0 0.0 - 0.4 10*9/L    Absolute Basophils 2.0 (H) 0.0 - 0.1 10*9/L    Smear Review Comments See Comment (A) Undefined    Polychromasia Moderate (A) Not Present    Basophilic Stippling Present (A) Not Present

## 2020-03-12 NOTE — Unmapped (Unsigned)
Malignant Hematology Established Visit      Patient: Howard Villarreal  MRN: [id number]  DOB: Jun 15, 1946  Date of Visit: 03/12/2020    Local Oncologist: Dr. Nelva Nay    Reason for Visit   Howard Villarreal is a 74 y.o. with post-PV MF here for urgent visit due to concern for progression to AML.     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     Overall survival for AML arising from myelofibrosis is <6 months.  The best chance for cure and improved long-term survival is allogeneic stem cell transplant. (Blood. 2020;136(1):61-70)    Options:  1. Do nothing - focus on quality of life.  Estimated <6 months. Could have blood transfusions or opt to not have transfusions.   2. Intensive chemotherapy - admitted to hospital for ~1-2 months for CPX-351 chemotherapy.  Reports of 70% complete response, with 35-month overall survival of 70%. Renal function presently borderline (GFR 30 ml/min). [email or web address]  3. Lower intensity chemotherapy - anticipate outpatient treatment: decitabine (infusion) + venetoclax (pill).  Complete response rate of 50%, median overall survival 9 months.  DOI 10.1182/bloodadvances.2956213086.  Would favor azacitidine instead.   4. Other - do not yet have mutation status (such as for FLT3 - midostaurin)    If you were able to achieve and maintain a complete response for long enough, allogeneic stem cell transplantation can be considered. Reported outcomes are variable, with 30-75% 2-year overall survival reported (versus 0-15% with chemotherapy alone).  Currently, renal function and performance status are not acceptable for ASCT - unclear how much these will improve. Howard Villarreal indicated that he is primarily focused on quality of life.  He will consider the treatment options outlined and will meet with Howard Villarreal next week.  In the meantime, peripheral blood flow cytometry, MMP, karyotype are pending for better understanding of targeted therapies for sAML.  He will start hydroxyurea in an attempt to reduce WBC to <10 x 10^9/L in anticipation of venetoclax.  Will give an additional burst of azithromycin given he had significant improvement with it - to complete along with the amoxicillin-clavulanate course.       Plan   1. Add azithromycin (500mg  po Day 1 followed by 250mg  po daily Days 2-5) to amoxicillin-clavulanate to finish CAP course.  If no improvement, would get a chest CT w/o contrast and would consider alternative therapy with respiratory fluoroquinolone.   2. Begin hydroxyurea 500mg  po BID.  Target WBC <10.  Will need weekly CBC-D.    Transfusion parameters: hgb <8g/dL, platelets <57  3. Continue allopurinol 150mg  po daily  4. Await results of flow, MMP, karyotype  5. Anticipate starting azacitidine + venetoclax.  In order to prevent delays, while awaiting additional studies, will begin PA process and scheduling.  Signature waiver form obtained.   6. Taper fedratinib: Decrease to 100mg  po daily x 5 days, then stop.   7. Follow-up here in 1 week, anticipating treatment decision at that point.  Will be with Howard Villarreal.     Howard Villarreal, M.D.  Assistant Professor  Division of Hematology    Co-PI, [facility]  Member, [facility]  Member, [facility]      [address]  [place] [address]  [address]  [place] [address]    Questions and appointments M-F 8am - 5pm: [phone] or [phone]    Myeloproliferative Neoplasms & Bone Marrow Failure Clinical Team  Nurse Navigator: Howard Villarreal   New Patient Intake Coordinator: Howard Villarreal  Return Patient Scheduler: Laurance Flatten    Nurse Practitioner: Howard Villarreal, AGNP  Clinical Pharmacist: Howard Villarreal, CPP  Medication Access Specialist: Howard Villarreal      Interval History:  Had pneumonia a few weeks ago and received antibiotics with resolution.   Was noted to have falling hemoglobin despite fedratinib dose reductions and a bone marrow biopsy was reportedly done at [facility] which was a dry tap.  Reportedly with 26% blasts in the periperhy: the final pathology report will not be released to [place] at this time as it is incomplete.     Started azithromycin + amoxicillin-clavulanate on 6/10 - felt much better on azithromycin.     Otherwise, denies new bone pain, fevers, chills, night sweats, lumps/bumps, tongue swelling, shortness of breath, syncope, lightheadedness, constipation or diarrhea, nausea or vomiting, very easy bruising or bleeding, or urinary changes.       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 He has had polycythemia since at least 2013 (no labs prior to that, but on 08/24/12, WBC 21, Hgb 19.3, Hct 59.8%, Plts 551). Hematocrit was 62.1 with a hemoglobin of 19.8 on 03/28/2015. 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 Erythropoietin level was 1.1 (low).     He began a phlebotomy program on 03/28/2015 when he was seen in consultation by Dr. Merlene Pulling and diagnosed with PV to maintain a hematocrit goal of < 45%; he was likely iron deficient at diagnosis, with MCV 73fL.   He last underwent phlebotomy on 05/02/2015. He is maintained on aspirin 81mg  po daily without significant bleeding nor dyspepsia.     He is followed closely by GI (Dr. Mechele Collin) for a history of polyps and Crohn's disease. Last colonoscopy was 2012. He has become iron deficient on his phlebotomy program (ferritin 21 on 11/28/2015).     CBC on 11/28/2015 revealed a hematocrit of 39.3, hemoglobin 11.9, platelets 463,000, WBC 32,000 with an ANC of 25,000. Differential included 71% segs, 4% lymphocytes and 17% monocytes. Peripheral smear revealed leukocytosis with predominantly mature neutrophils, increased monocytes and rare blasts (<1%).    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 Marrow was packed (95-100% cellularity) with pan myelosis, multi-lineage dyspoiesis, and no significant increase in blasts. There was moderate to focally marked reticulin fibrosis (grade 2-3/3). Storage iron was not identified. Flow cytometry revealed non-specific atypical myeloid findings with no increase in blasts. 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 Cytogenetics were normal (46, XY).    He was briefly on hydroxyurea 500 mg a day (12/05/2015 - 12/19/2015). He began allopurinol on 12/19/2015. Uric acid has decreased from 12 to 5.8 to 5.1. Creatinine is stable at 1.62 (CrCl 42 ml/min). LDH was 466 on 01/02/2016. Hematocrit is drifting down (36.9 to 35.4). Platelets are 228,000 and WBC of 21,700.    [facility] review of the bmbx:  Morphologic evaluation, together with review of outside reports of flow cytometry, cytogenetics, and our review  of outside special stains, support the above interpretation. Morphologic evaluation of the aspirate smear may be limited by suboptimal/weak staining. While the aspirate differential count does not reflect an increase in blasts, there are scattered immature appearing mononuclear cells that may represent hypogranular and/or maturing granulocytes. Megakaryocytes are dysplastic, showing numerous hypo-loabted and multi-nucleated forms as would be seen in myelodysplastic syndrome, with relatively infrequent hyper-lobated forms. Erythroid precursors show at least mild dysplasia. Granulocytic precursors may show hypogranularity. The bone marrow biopsy demonstrates a hypercellular marrow with increased reticulin fibrosis. Overall, while it is difficult to exclude a post-polycythemic myelofibrosis with progression to dysplasia given the patient's history, an overlap myeloproliferative/myedysplastic neoplasm would also be in the differential. In addition, flow cytometric, CD34 immunohistochemistry on the biopsy and marrow aspirate differential show no definitive increase in blasts, the possible suboptimal staining of the aspirate smear and/or true hypogranularity of the granulocytes may lead to an underestimate of these forms. Repeat marrow evaluation and correlation with myeloid mutation panel (MDS/MPN) is recommended.??    His bone marrow biopsy from 04/07/16 showed a >95% cellular marrow with 5% blasts by manual aspirate differential, mild reticulin fibrosis (but not enough to classify as MF), mild dyserythropoiesis, hypogranular neutrophils (but not enough to classify as MDS) and molecular mutations in: JAK2 V617F, IDH2, RUNX1, SRSF2.  Mutations in RUNX1 and SRSF2 are associated with poorer prognosis and shorter survival in MPN.  However, median overall survival with this is still reasonably long, estimated at 10 years assuming one still classifies this as PV, which technically it is  [[email or web address]://[email or web address].html].  I do think what we are seeing is PV evolving to MF (especially given the prior outside bmbx that showed moderated to marked fibrotic changes, but only minimal on our sampling plus the presence of nucleated RBCs in the periphery and elevated LDH.    Assuming this were MF, he would be classified as Intermediate-2 risk by DIPPS and the same by DIPPS Plus, noting that he does have a higher-risk mutational profile with RUNX1 and SRSF2 (though only ASXL1 remained significant in the follow-up analyses of molecular markers).      Review of Systems     Wt Readings from Last 12 Encounters:   03/06/20 76.2 kg (168 lb 1.6 oz)   07/14/19 76 kg (167 lb 8 oz) 07/14/19 75.9 kg (167 lb 5.3 oz)   07/11/19 75.8 kg (167 lb 3.2 oz)   08/09/18 84.8 kg (187 lb 1 oz)   07/12/18 83.9 kg (184 lb 15.5 oz)   01/04/18 86 kg (189 lb 8 oz)   07/06/17 83.7 kg (184 lb 9.6 oz)   12/29/16 84.3 kg (185 lb 14.4 oz)   08/04/16 79.4 kg (175 lb 1.6 oz)   05/05/16 80.3 kg (177 lb)   04/07/16 79.9 kg (176 lb 4.1 oz)     + unintentional weight loss  Very fatigued - has not excerised much at all.  Did make it one mile a few weeks ago.  Has had a pneumonia ~3 months with coughing.  Took azithromycin last week with improvement and now amoxicillin.    (-) fever  (-) night sweats  Intermittent appetite - not clear if is related to early satiety.   No abdominal pain.   Sometimes swollen feet.  Had improved with ruxolitinib but not fedratinib.   No orthopnea. But is not able to speak in full sentences.     Urinary catheter - has been present for a long time.  BPH.    No falls.  No mucocutaneous bleeding, but is bruising easily. Had nose bleeding cauterized and hasn't returned.         10 systems reviewed and negative except as noted in the HPI and Interval History.     Medications     Current Outpatient Medications   Medication Sig Dispense Refill   ??? acetaminophen (TYLENOL) 500 MG tablet Take 500 mg by mouth every eight (8) hours as needed.     ??? allopurinoL (ZYLOPRIM) 300 MG tablet Take 150 mg by mouth daily.      ??? amoxicillin-clavulanate (AUGMENTIN) 875-125 mg per tablet Take 1 tablet by mouth.     ??? aspirin 81 MG chewable tablet Chew 81 mg. (Patient not taking: Reported on 03/06/2020)     ??? azithromycin (ZITHROMAX) 250 MG tablet Take 1 tablet (250 mg total) by mouth daily. Take 2 tablets (500mg ) on Day 1 and 1 tablet days 2-5. 6 tablet 0   ??? clobetasoL (TEMOVATE) 0.05 % ointment      ??? cyanocobalamin 1000 MCG tablet Take 1,000 mcg by mouth daily.      ??? fedratinib (INREBIC) 100 mg cap Take 4 capsules (400 mg) by mouth daily. 120 capsule 2   ??? finasteride (PROSCAR) 5 mg tablet Take 5 mg by mouth daily with evening meal.     ??? hydroxyurea (HYDREA) 500 mg capsule Take 1 capsule (500 mg total) by mouth daily. 60 capsule 2   ??? latanoprost (XALATAN) 0.005 % ophthalmic solution 1 drop nightly.     ??? neomycin-polymyxin-dexamethasone (MAXITROL) 3.5mg /mL-10,000 unit/mL-0.1 % ophthalmic suspension APPLY 1 DROP INTO RIGHT EYE THREE TIMES A DAY (Patient not taking: Reported on 03/06/2020)     ??? sodium bicarbonate 650 mg tablet Take 650 mg by mouth Two (2) times a day.     ??? tamsulosin (FLOMAX) 0.4 mg capsule Comments:  - Filled Date: Aug 24 2016 12:00AM - Patient Notes: TAKE 1 CAPSULE BY MOUTH DAILY   Duration: 30     ??? timolol (BETIMOL) 0.5 % ophthalmic solution 1 drop Two (2) times a day.     ??? timolol (TIMOPTIC) 0.5 % ophthalmic solution Apply 1 drop to eye daily.     ??? venetoclax (VENCLEXTA) 100 mg tablet Take 4 tablets (400 mg total) by mouth daily. Take with a meal and water. Do not chew, crush, or break tablets. 120 tablet 1     No current facility-administered medications for this visit.         Allergies   No Known Allergies    Past Medical and Surgical History     Past Medical History:   Diagnosis Date   ??? 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 CKD (chronic kidney disease) stage 3, GFR 30-59 ml/min    ??? Crohn's colitis (CMS-HCC) 1986    No surgeries. Was on 6-MP for awhile; hasn't been on anything 2005.   ??? Glaucoma    ??? 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      Past Surgical History:   Procedure Laterality Date   ??? MOHS SURGERY  2016    left ear for non-melanoma skin cancer       Social History     Social History     Socioeconomic History   ??? Marital status: Married     Spouse name: Not on file   ??? Number of children: Not on file   ??? Years of education: Not on file   ???  Highest education level: Not on file   Occupational History   ??? Not on file   Tobacco Use   ??? Smoking status: Former Smoker   ??? Smokeless tobacco: Never Used   Vaping Use   ??? Vaping Use: Never used   Substance and Sexual Activity   ??? Alcohol use: Yes     Alcohol/week: 4.0 standard drinks     Types: 4 Glasses of wine per week     Comment: occasional    ??? Drug use: Not on file   ??? Sexual activity: Not on file   Other Topics Concern   ??? Not on file   Social History Narrative   ??? Not on file     Social Determinants of Health     Financial Resource Strain:    ??? Difficulty of Paying Living Expenses:    Food Insecurity:    ??? Worried About Programme researcher, broadcasting/film/video in the Last Year:    ??? Barista in the Last Year:    Transportation Needs:    ??? Freight forwarder (Medical):    ??? Lack of Transportation (Non-Medical):    Physical Activity:    ??? Days of Exercise per Week:    ??? Minutes of Exercise per Session:    Stress:    ??? Feeling of Stress :    Social Connections:    ??? Frequency of Communication with Friends and Family:    ??? Frequency of Social Gatherings with Friends and Family:    ??? Attends Religious Services:    ??? Database administrator or Organizations:    ??? Attends Banker Meetings:    ??? Marital Status:    Former Audiological scientist; retired several years ago.  Lives with his wife.        Physical Examination     There were no vitals filed for this visit.  GENERAL:  Quite thin - weight was recorded at 168 here, but 153 at home.  His waist is 32. Accompanied by his wife.  In no distress.  Pleasant and alert.   RESP: Breathing comfortably.  Speaking comfortably in full sentences.   During the 60 minute visit, 2 episodes of ~3 seconds of non-productive cough.    NEURO: A&Ox4.  CN II-XII grossly intact and symmetric.   PSYCH: Normal affect, mood is so so.  Forward thinking.  Goal-directed and linear.   EXT:  1+ pedal edema bilaterally.   SKIN:  Some bruising on arms, but no petechiae, no rashes.   .     Laboratory Testing and Imaging     Lab on 03/12/2020   Component Date Value Ref Range Status   ??? Sodium 03/12/2020 134* 135 - 145 mmol/L Final   ??? Potassium 03/12/2020 5.0  3.5 - 5.0 mmol/L Final   ??? Chloride 03/12/2020 105  98 - 107 mmol/L Final   ??? Anion Gap 03/12/2020 7  7 - 15 mmol/L Final   ??? CO2 03/12/2020 22.0  22.0 - 30.0 mmol/L Final   ??? BUN 03/12/2020 32* 7 - 21 mg/dL Final   ??? Creatinine 03/12/2020 1.86* 0.70 - 1.30 mg/dL Final   ??? BUN/Creatinine Ratio 03/12/2020 17   Final   ??? EGFR CKD-EPI Non-African American,* 03/12/2020 35* >=60 mL/min/1.78m2 Final   ??? EGFR CKD-EPI African American, Male 03/12/2020 41* >=60 mL/min/1.18m2 Final   ??? Glucose 03/12/2020 91  70 - 179 mg/dL Final   ??? Calcium 16/06/9603 8.6  8.5 -  10.2 mg/dL Final   ??? Albumin 16/06/9603 3.5  3.5 - 5.0 g/dL Final   ??? Total Protein 03/12/2020 8.1  6.5 - 8.3 g/dL Final   ??? Total Bilirubin 03/12/2020 0.8  0.0 - 1.2 mg/dL Final   ??? AST 54/05/8118 55  19 - 55 U/L Final   ??? ALT 03/12/2020 10  <50 U/L Final   ??? Alkaline Phosphatase 03/12/2020 114  38 - 126 U/L Final   ??? WBC 03/12/2020 67.0* 4.5 - 11.0 10*9/L Final   ??? RBC 03/12/2020 3.82* 4.50 - 5.90 10*12/L Final   ??? HGB 03/12/2020 9.9* 13.5 - 17.5 g/dL Final   ??? HCT 14/78/2956 33.1* 41.0 - 53.0 % Final   ??? MCV 03/12/2020 86.6  80.0 - 100.0 fL Final   ??? MCH 03/12/2020 26.0  26.0 - 34.0 pg Final   ??? MCHC 03/12/2020 30.0* 31.0 - 37.0 g/dL Final   ??? RDW 21/30/8657 20.0* 12.0 - 15.0 % Final   ??? MPV 03/12/2020 9.8  7.0 - 10.0 fL Final   ??? Platelet 03/12/2020 369  150 - 440 10*9/L Final   ??? nRBC 03/12/2020 1  <=4 /100 WBCs Final   ??? Variable HGB Concentration 03/12/2020 Slight* Not Present Final   ??? Microcytosis 03/12/2020 Slight* Not Present Final   ??? Anisocytosis 03/12/2020 Moderate* Not Present Final   ??? Hypochromasia 03/12/2020 Marked* Not Present Final   ??? Neutrophils % 03/12/2020 65  % Final   ??? Lymphocytes % 03/12/2020 5  % Final   ??? Monocytes % 03/12/2020 8  % Final   ??? Eosinophils % 03/12/2020 0  % Final   ??? Basophils % 03/12/2020 3  % Final   ??? Blasts % 03/12/2020 19* <=0 % Final   ??? Absolute Neutrophils 03/12/2020 43.6* 2.0 - 7.5 10*9/L Final   ??? Absolute Lymphocytes 03/12/2020 3.4  1.5 - 5.0 10*9/L Final   ??? Absolute Monocytes 03/12/2020 5.4* 0.2 - 0.8 10*9/L Final   ??? Absolute Eosinophils 03/12/2020 0.0  0.0 - 0.4 10*9/L Final   ??? Absolute Basophils 03/12/2020 2.0* 0.0 - 0.1 10*9/L Final   ??? Smear Review Comments 03/12/2020 See Comment* Undefined Final    Slide reviewed. Blasts Present. Promyelocytes present-rare. Myelocytes present. Large platelets present.   ??? Polychromasia 03/12/2020 Moderate* Not Present Final   ??? Basophilic Stippling 03/12/2020 Present* Not Present Final   Lab Requisition on 02/28/2020   Component Date Value Ref Range Status   ??? Final Diagnosis 02/28/2020    Final                    Value:This result contains rich text formatting which cannot be displayed here.   ??? Comment 02/28/2020    Final                    Value:This result contains rich text formatting which cannot be displayed here.   ??? Clinical History 02/28/2020    Final                    Value:This result contains rich text formatting which cannot be displayed here.   ??? Gross Description 02/28/2020    Final                    Value:This result contains rich text formatting which cannot be displayed here.   ??? Microscopic Description 02/28/2020    Final  Value:This result contains rich text formatting which cannot be displayed here.   ??? Ancillary Studies 02/28/2020    Final                    Value:This result contains rich text formatting which cannot be displayed here.   ??? Disclaimer 02/28/2020    Final                    Value:This result contains rich text formatting which cannot be displayed here.   Lab on 03/06/2020   Component Date Value Ref Range Status   ??? Sodium 03/06/2020 134* 135 - 145 mmol/L Final   ??? Potassium 03/06/2020 5.5* 3.5 - 5.0 mmol/L Final   ??? Chloride 03/06/2020 104  98 - 107 mmol/L Final   ??? Anion Gap 03/06/2020 6* 7 - 15 mmol/L Final   ??? CO2 03/06/2020 24.0  22.0 - 30.0 mmol/L Final   ??? BUN 03/06/2020 38* 7 - 21 mg/dL Final   ??? Creatinine 03/06/2020 2.14* 0.70 - 1.30 mg/dL Final   ??? BUN/Creatinine Ratio 03/06/2020 18   Final   ??? EGFR CKD-EPI Non-African American,* 03/06/2020 30* >=60 mL/min/1.90m2 Final   ??? EGFR CKD-EPI African American, Male 03/06/2020 34* >=60 mL/min/1.80m2 Final   ??? Glucose 03/06/2020 77  70 - 179 mg/dL Final   ??? Calcium 16/06/9603 8.9  8.5 - 10.2 mg/dL Final   ??? Albumin 54/05/8118 3.7  3.5 - 5.0 g/dL Final   ??? Total Protein 03/06/2020 8.2  6.5 - 8.3 g/dL Final   ??? Total Bilirubin 03/06/2020 0.9  0.0 - 1.2 mg/dL Final   ??? AST 14/78/2956 72* 19 - 55 U/L Final   ??? ALT 03/06/2020 13  <50 U/L Final   ??? Alkaline Phosphatase 03/06/2020 100  38 - 126 U/L Final   ??? Ferritin 03/06/2020 141.0  27.0 - 377.0 ng/mL Final   ??? LDH 03/06/2020 3,620* 338 - 610 U/L Final   ??? Uric Acid 03/06/2020 6.9  4.0 - 9.0 mg/dL Final   ??? Hep B Core Total Ab 03/06/2020 Nonreactive  Nonreactive Final   ??? Hep B S Ab 03/06/2020 Nonreactive  Nonreactive, Grayzone Final    Nonreactive and Grayzone results are considered non-immune.   ??? Hep B Surf Ab Quant 03/06/2020 <8.00  <8.00 m(IU)/mL Final   ??? Hep B Surface Ag 03/06/2020 Nonreactive  Nonreactive Final   ??? Hepatitis C Ab 03/06/2020 Nonreactive  Nonreactive Final    Antibodies to HCV were not detected.  A nonreactive result does not exclude the possibility of exposure to HCV.   ??? Special Collection 03/06/2020 Collected   Final   ??? WBC 03/06/2020 53.2* 4.5 - 11.0 10*9/L Final   ??? RBC 03/06/2020 3.65* 4.50 - 5.90 10*12/L Final   ??? HGB 03/06/2020 9.8* 13.5 - 17.5 g/dL Final   ??? HCT 21/30/8657 31.5* 41.0 - 53.0 % Final   ??? MCV 03/06/2020 86.3  80.0 - 100.0 fL Final   ??? MCH 03/06/2020 26.8  26.0 - 34.0 pg Final   ??? MCHC 03/06/2020 31.1  31.0 - 37.0 g/dL Final   ??? RDW 84/69/6295 20.3* 12.0 - 15.0 % Final   ??? MPV 03/06/2020 9.6  7.0 - 10.0 fL Final   ??? Platelet 03/06/2020 387  150 - 440 10*9/L Final   ??? nRBC 03/06/2020 3  <=4 /100 WBCs Final   ??? Variable HGB Concentration 03/06/2020 Slight* Not Present Final   ??? Microcytosis 03/06/2020 Moderate* Not Present  Final   ??? Anisocytosis 03/06/2020 Moderate* Not Present Final   ??? Hypochromasia 03/06/2020 Marked* Not Present Final   ??? Case Report 03/06/2020    Final                    Value:Molecular Genetics Report                         Case: [id number]                                 Authorizing Provider:  [name], MD  Collected:           03/06/2020 0743              Ordering Location:     [facility]    Received:            03/06/2020 0754                                     [facility]                                                      Pathologist:           Claudia Desanctis,                                                                            MD                                                                           Specimen:    Blood                                                                                     ??? FLT3-ITD Mutation  03/06/2020 Negative   Final   ??? FLT3-TKD D835 or I836 mutation  03/06/2020 Negative   Final   ??? FLT3 DNA Analysis Results 03/06/2020    Final                    Value:This result contains rich text formatting which cannot be displayed here.   ??? Collection 03/06/2020 Collected   Final   ??? Final Diagnosis 03/06/2020    Final  Value:This result contains rich text formatting which cannot be displayed here.   ??? Comment 03/06/2020    Final                    Value:This result contains rich text formatting which cannot be displayed here.   ??? Clinical History 03/06/2020    Final                    Value:This result contains rich text formatting which cannot be displayed here.   ??? Gross Description 03/06/2020    Final                    Value:This result contains rich text formatting which cannot be displayed here.   ??? Microscopic Description 03/06/2020    Final                    Value:This result contains rich text formatting which cannot be displayed here.   ??? Disclaimer 03/06/2020 Final                    Value:This result contains rich text formatting which cannot be displayed here.   ??? Flow Cytometry Summary 03/06/2020    Final                    Value:Flow Cytometric Immunophenotyping Results:    Peripheral blood  Heme WBC  49,190 cells/uL     Flow Differential (% of Total)  Viability:   N/A  Total of Markers Charged 31  Markers-1 Charged  30    Lymphocytes  3  Monocytes  4   Neutrophils  64  Eosinophils  1  Basophils  4  NRBC's  2  Debris   2  Blasts/Immature cells 17     Gated Population:  17% Blasts    Description of Gated Cells (% of Gated)     B-Cell Markers:    CD19   <1%  CD20   <1%  CD22   <1%  Kappa   <1%  Lambda  <1%    T-Cell Markers:    CD2   <1%  sCD3   <1%  sCD3/CD4  <1%  Total CD4  <1%  CD5   <1%  CD7   <1/1%  sCD3/CD8  <1%  Total CD8  <1%  cyCD3   <1%    Myeloid Markers:    CD11b   4%  CD13   97%  CD14    <1%  CD15    4%  CD33    <1%  CD64    1%  CD117   92%  MPO    4%    Miscellaneous:    CD10    <1%  CD11c   6%  CD34    97%  CD38    45%  CD45    100%  CD56    1%  CD57    <1%  CD71    14%  HLA-DR   97%  CD3-/CD16+56  2%  Total CD16+56  2%  TdT    <1%    Flow Cytometry Interpretation:  Flow cytometric analysis of the                           peripheral blood reveals an elevated white blood cell count (49,190 cells/uL) with cells differentiated as indicated.    Several populations are analyzed.  The first population of 17% cells is gated on the immature cell region characterized by dim CD45 expression and low side scatter. Cells within this region express CD13, CD34, CD117, and HLA-DR, and lack CD33, B- and T-cell antigens, and other informative markers.     This immunophenotype is characteristic of myeloblasts, representing 17% of the peripheral white blood cells.    Separate analysis of the lymphocyte region (data chart not shown) reveals 74% T-cells (CD4: CD8 ratio is 1.4:1) without an aberrant immunophenotype, 11% polytypic B-cells, and 2% NK-cells.    Separate analysis of the monocytic cell region (data chart not shown) reveals primarily mature monocytes with no overt immunophenotypic aberrancy.     This test, utilizing analyte-specific reagents (ASR) was developed and its performance characteristics determined by                           the [facility].  It has not been cleared or approved by the U.[place]. Food and Drug Administration (FDA).  The FDA has determined that such clearance or approval is not necessary.  This test is used for clinical purposes.  It should not be regarded as investigational or for research.   ??? Pathologist Smear Interpretation 03/06/2020 Confirmed by Hemepath Specialist/Senior Tech  Confirmed by Hemepath Fellow, Confirmed by Hemepath Specialist/Senior Tech, Confirmed by Core Specialist/Senior Tech, To Be Accessioned - Case Report to Follow Final   ??? RESULTS 03/06/2020    Preliminary                    Value:This result contains rich text formatting which cannot be displayed here.   ??? Interpretation 03/06/2020    Preliminary                    Value:This result contains rich text formatting which cannot be displayed here.   ??? No.Cells 03/06/2020 14   Preliminary   ??? Culture(s) Examined 03/06/2020 2   Preliminary   ??? Cells Analyzed 03/06/2020 8   Preliminary   ??? Cells Karyotyped 03/06/2020 6   Preliminary   ??? Stain(s) Used 03/06/2020 G-Bands and FISH   Preliminary   ??? Indication for Study 03/06/2020    Preliminary                    Value:This result contains rich text formatting which cannot be displayed here.   ??? Neutrophils % 03/06/2020 65  % Final   ??? Lymphocytes % 03/06/2020 7  % Final   ??? Monocytes % 03/06/2020 7  % Final   ??? Eosinophils % 03/06/2020 1  % Final   ??? Basophils % 03/06/2020 3  % Final   ??? Blasts % 03/06/2020 17* <=0 % Final   ??? Absolute Neutrophils 03/06/2020 34.6* 2.0 - 7.5 10*9/L Final   ??? Absolute Lymphocytes 03/06/2020 3.7  1.5 - 5.0 10*9/L Final   ??? Absolute Monocytes 03/06/2020 3.7* 0.2 - 0.8 10*9/L Final   ??? Absolute Eosinophils 03/06/2020 0.5* 0.0 - 0.4 10*9/L Final   ??? Absolute Basophils 03/06/2020 1.6* 0.0 - 0.1 10*9/L Final   ??? Smear Review Comments 03/06/2020 See Comment* Undefined Final    Slide Reviewed.  Blasts Present.  Promyelocytes present - rare.  Myelocytes present. Large platelets present.    ??? Polychromasia 03/06/2020 Slight* Not Present Final   ??? Basophilic Stippling 03/06/2020 Present* Not Present Final

## 2020-03-12 NOTE — Unmapped (Signed)
Patient [facility] Cancer Support Call  Distress Level:6  Barrier assessment: financial, communication, practical, psychosocial and medical system.  Notes:  Talked with both Mr. and Mrs. Cinnamon at the same time. They expressed that they want to take a break, and go to the beach for a week or 10 days (they will bring it up to their provider tomorrow). They said that during this time they want to clear their heads and think more about the treatment. I gave them our phone # and asked them to give [place] a call if they need anything or have any questions. They were very happy to receive a call and said that they might call [facility] tomorrow after their appointment. Otherwise, the patient is doing good and they have great social support.  Referrals: none at this time  Call made by: Jyl Heinz Volunteer Patient Navigator  Length of call: 20 minutes

## 2020-03-12 NOTE — Unmapped (Unsigned)
I met with this patient in our clinic today to introduce the role of the nurse navigator, provide patient education, and assist with care coordination.    Provided the following print resources & education on each:  [x] [facility] Cancer Care Patient Guide  [x] When to Call Your Cancer Care Team    Also discussed the appropriate use of MyChart and the [phone] numbers.    Due to his goals at this time and discussions with his wife, they would like to take a pause right now before starting Aza/Ven and take a trip to the beach.  They see their local oncologist on Wednesday and will discuss this with that doctor as well.    We will cancel the PICC line for now (anticipate waiting until he is back from the beach).  His wife will send [place] a MyChart message or call to let us know when they are going to be going/coming back from the beach (they anticipate going ~30 min from [place]).  They understand he will need to go to the nearest ED if he develops a fever of 100.5 or higher (or bleeding).      We discussed, at length, the logistics surrounding Aza/Ven.  He is interested in staying locally, but lives <50 miles away, so will not be eligible to stay in [facility] for the length of time necessary.  I will send him and his wife a list of local lodging that may be able to give him a patient discount.

## 2020-03-12 NOTE — Unmapped (Signed)
Pre-call attempted for upcoming VIR procedure.  No answer, left message

## 2020-03-12 NOTE — Unmapped (Signed)
Blood drawn from Left AC with 23 x 3/4 butterfly needle without difficulty.  Pt tolerated procedure without complaints.

## 2020-03-12 NOTE — Unmapped (Incomplete)
Malignant Hematology Established Visit      Patient: Howard Villarreal  MRN: [id number]  DOB: 1946-08-27  Date of Visit: 03/12/2020    Local Oncologist: Dr. Nelva Nay    Reason for Visit   Howard Villarreal is a 74 y.o. 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  He continues to have a bothersome cough with diminished breath sounds on the left side.  He completed all antibiotics.  Would recommend that he get a CT w/o contrast to follow up on this.  He is seeing Dr. Merlene Pulling on Wednesday - he'd like to get this done locally.    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 Anise Salvo.  He was started on HU 500mg  BID which he has been taking without issues.  His WBC has gone up slightly since last week despite taking this.  The goal is to get his WBC <10 to avoid admission for venetoclax induction.  Will plan to increase HU to 1000mg  BID to try to bring this down.  He'll get labs checked in 1 week at [facility].    He and his family have discussed his options of treatment.  His FLT3 testing came back negative, which means his likely best option is aza/ven as he wants to avoid CPX.  His question today is whether or not he can wait to do treatment.  He would like to delay 2 weeks so that he can spend time with his family.  We will continue to follow his labs and once he's ready to start treatment will check his WBC and plan for admission if WBC >10.     Reviewed standard cycle for aza/ven, including need for frequent labs to assess for TLS, hydration as necessary.  After the first cycle, it would be reasonable to have him get subsequent cycles at [facility].  Discussed the average 28 day cycle length, which could be longer based on lab values.  He and his wife Howard Villarreal indicated that he is primarily focused on quality of life.  He will consider the treatment options outlined and will meet with [name] next week.  In the meantime, peripheral blood flow cytometry, MMP, karyotype are pending for better understanding of targeted therapies for sAML.  He will start hydroxyurea in an attempt to reduce WBC to <10 x 10^9/L in anticipation of venetoclax.  Will give an additional burst of azithromycin given he had significant improvement with it - to complete along with the amoxicillin-clavulanate course.     Increase HU to 1g BID  Wait on PICC  Wait on treatment  [facility]  Plan for aza/ven  Stop fedratinib  Labs in 1 week -     Plan   1. Add azithromycin (500mg  po Day 1 followed by 250mg  po daily Days 2-5) to amoxicillin-clavulanate to finish CAP course.  If no improvement, would get a chest CT w/o contrast and would consider alternative therapy with respiratory fluoroquinolone.   2. Begin hydroxyurea 500mg  po BID.  Target WBC <10.  Will need weekly CBC-D.    Transfusion parameters: hgb <8g/dL, platelets <81  3. Continue  allopurinol 150mg  po daily  4. Await results of flow, MMP, karyotype  5. Anticipate starting azacitidine + venetoclax.  In order to prevent delays, while awaiting additional studies, will begin PA process and scheduling.  Signature waiver form obtained.   6. Taper fedratinib: Decrease to 100mg  po daily x 5 days, then stop.   7. Follow-up here in 1 week, anticipating treatment decision at that point.  Will be with [name].     [name], RN, MSN, AGPCNP-C  Nurse Practitioner  Hematologic Malignancies  [facility]  [phone] (phone)  [phone] (fax)  [email or web address]      Would like to wait to do therapy for 2 weeks. hit the pause button and go for vacation.    Interval History:  Doing fair.  Still having cough - will sometimes happen when he talks a lot.  This resolved some after antibiotics but had come back.   Had pneumonia a few weeks ago and received antibiotics with resolution.   Was noted to have falling hemoglobin despite fedratinib dose reductions and a bone marrow biopsy was reportedly done at [facility] which was a dry tap.  Reportedly with 26% blasts in the periperhy: the final pathology report will not be released to [place] at this time as it is incomplete.     Started azithromycin + amoxicillin-clavulanate on 6/10 - felt much better on azithromycin.     Otherwise, denies new bone pain, fevers, chills, night sweats, lumps/bumps, tongue swelling, shortness of breath, syncope, lightheadedness, constipation or diarrhea, nausea or vomiting, very easy bruising or bleeding, or urinary changes.       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 He has had polycythemia since at least 2013 (no labs prior to that, but on 08/24/12, WBC 21, Hgb 19.3, Hct 59.8%, Plts 551). Hematocrit was 62.1 with a hemoglobin of 19.8 on 03/28/2015. 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 Erythropoietin level was 1.1 (low).     He began a phlebotomy program on 03/28/2015 when he was seen in consultation by Dr. Merlene Pulling and diagnosed with PV to maintain a hematocrit goal of < 45%; he was likely iron deficient at diagnosis, with MCV 27fL.   He last underwent phlebotomy on 05/02/2015. He is maintained on aspirin 81mg  po daily without significant bleeding nor dyspepsia.     He is followed closely by GI (Dr. Mechele Collin) for a history of polyps and Crohn's disease. Last colonoscopy was 2012. He has become iron deficient on his phlebotomy program (ferritin 21 on 11/28/2015).     CBC on 11/28/2015 revealed a hematocrit of 39.3, hemoglobin 11.9, platelets 463,000, WBC 32,000 with an ANC of 25,000. Differential included 71% segs, 4% lymphocytes and 17% monocytes. Peripheral smear revealed leukocytosis with predominantly mature neutrophils, increased monocytes and rare blasts (<1%). 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 Marrow was packed (95-100% cellularity) with pan myelosis, multi-lineage dyspoiesis, and no significant increase in blasts. There was moderate to focally marked reticulin fibrosis (grade 2-3/3). Storage iron was not identified. Flow cytometry revealed non-specific atypical myeloid findings with no increase in blasts. 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 Cytogenetics were normal (46, XY).    He was briefly on hydroxyurea 500 mg a day (12/05/2015 - 12/19/2015). He began allopurinol on 12/19/2015. Uric acid has decreased from 12 to 5.8 to 5.1. Creatinine  is stable at 1.62 (CrCl 42 ml/min). LDH was 466 on 01/02/2016. Hematocrit is drifting down (36.9 to 35.4). Platelets are 228,000 and WBC of 21,700.     Hematopathology review of the bmbx:  Morphologic evaluation, together with review of outside reports of flow cytometry, cytogenetics, and our review of outside special stains, support the above interpretation. Morphologic evaluation of the aspirate smear may be limited by suboptimal/weak staining. While the aspirate differential count does not reflect an increase in blasts, there are scattered immature appearing mononuclear cells that may represent hypogranular and/or maturing granulocytes. Megakaryocytes are dysplastic, showing numerous hypo-loabted and multi-nucleated forms as would be seen in myelodysplastic syndrome, with relatively infrequent hyper-lobated forms. Erythroid precursors show at least mild dysplasia. Granulocytic precursors may show hypogranularity. The bone marrow biopsy demonstrates a hypercellular marrow with increased reticulin fibrosis. Overall, while it is difficult to exclude a post-polycythemic myelofibrosis with progression to dysplasia given the patient's history, an overlap myeloproliferative/myedysplastic neoplasm would also be in the differential. In addition, flow cytometric, CD34 immunohistochemistry on the biopsy and marrow aspirate differential show no definitive increase in blasts, the possible suboptimal staining of the aspirate smear and/or true hypogranularity of the granulocytes may lead to an underestimate of these forms. Repeat marrow evaluation and correlation with myeloid mutation panel (MDS/MPN) is recommended.??    His bone marrow biopsy from 04/07/16 showed a >95% cellular marrow with 5% blasts by manual aspirate differential, mild reticulin fibrosis (but not enough to classify as MF), mild dyserythropoiesis, hypogranular neutrophils (but not enough to classify as MDS) and molecular mutations in: JAK2 V617F, IDH2, RUNX1, SRSF2.  Mutations in RUNX1 and SRSF2 are associated with poorer prognosis and shorter survival in MPN.  However, median overall survival with this is still reasonably long, estimated at 10 years assuming one still classifies this as PV, which technically it is  [[email or web address]://[email or web address].html].  I do think what we are seeing is PV evolving to MF (especially given the prior outside bmbx that showed moderated to marked fibrotic changes, but only minimal on our sampling plus the presence of nucleated RBCs in the periphery and elevated LDH.    Assuming this were MF, he would be classified as Intermediate-2 risk by DIPPS and the same by DIPPS Plus, noting that he does have a higher-risk mutational profile with RUNX1 and SRSF2 (though only ASXL1 remained significant in the follow-up analyses of molecular markers).      Review of Systems     Wt Readings from Last 12 Encounters:   03/06/20 76.2 kg (168 lb 1.6 oz)   07/14/19 76 kg (167 lb 8 oz)   07/14/19 75.9 kg (167 lb 5.3 oz)   07/11/19 75.8 kg (167 lb 3.2 oz)   08/09/18 84.8 kg (187 lb 1 oz)   07/12/18 83.9 kg (184 lb 15.5 oz)   01/04/18 86 kg (189 lb 8 oz) 07/06/17 83.7 kg (184 lb 9.6 oz)   12/29/16 84.3 kg (185 lb 14.4 oz)   08/04/16 79.4 kg (175 lb 1.6 oz)   05/05/16 80.3 kg (177 lb)   04/07/16 79.9 kg (176 lb 4.1 oz)     + unintentional weight loss  Very fatigued - has not excerised much at all.  Did make it one mile a few weeks ago.  Has had a pneumonia ~3 months with coughing.  Took azithromycin last week with improvement and now amoxicillin.    (-) fever  (-) night sweats  Intermittent appetite - not clear if is related to early satiety.  No abdominal pain.   Sometimes swollen feet.  Had improved with ruxolitinib but not fedratinib.   No orthopnea. But is not able to speak in full sentences.     Urinary catheter - has been present for a long time.  BPH.    No falls.    No mucocutaneous bleeding, but is bruising easily. Had nose bleeding cauterized and hasn't returned.         10 systems reviewed and negative except as noted in the HPI and Interval History.     Medications     Current Outpatient Medications   Medication Sig Dispense Refill   ??? acetaminophen (TYLENOL) 500 MG tablet Take 500 mg by mouth every eight (8) hours as needed.     ??? allopurinoL (ZYLOPRIM) 300 MG tablet Take 150 mg by mouth daily.      ??? amoxicillin-clavulanate (AUGMENTIN) 875-125 mg per tablet Take 1 tablet by mouth.     ??? aspirin 81 MG chewable tablet Chew 81 mg. (Patient not taking: Reported on 03/06/2020)     ??? azithromycin (ZITHROMAX) 250 MG tablet Take 1 tablet (250 mg total) by mouth daily. Take 2 tablets (500mg ) on Day 1 and 1 tablet days 2-5. 6 tablet 0   ??? clobetasoL (TEMOVATE) 0.05 % ointment      ??? cyanocobalamin 1000 MCG tablet Take 1,000 mcg by mouth daily.      ??? fedratinib (INREBIC) 100 mg cap Take 4 capsules (400 mg) by mouth daily. 120 capsule 2   ??? finasteride (PROSCAR) 5 mg tablet Take 5 mg by mouth daily with evening meal.     ??? hydroxyurea (HYDREA) 500 mg capsule Take 1 capsule (500 mg total) by mouth daily. 60 capsule 2   ??? latanoprost (XALATAN) 0.005 % ophthalmic solution 1 drop nightly.     ??? neomycin-polymyxin-dexamethasone (MAXITROL) 3.5mg /mL-10,000 unit/mL-0.1 % ophthalmic suspension APPLY 1 DROP INTO RIGHT EYE THREE TIMES A DAY (Patient not taking: Reported on 03/06/2020)     ??? sodium bicarbonate 650 mg tablet Take 650 mg by mouth Two (2) times a day.     ??? tamsulosin (FLOMAX) 0.4 mg capsule Comments:  - Filled Date: Aug 24 2016 12:00AM - Patient Notes: TAKE 1 CAPSULE BY MOUTH DAILY   Duration: 30     ??? timolol (BETIMOL) 0.5 % ophthalmic solution 1 drop Two (2) times a day.     ??? timolol (TIMOPTIC) 0.5 % ophthalmic solution Apply 1 drop to eye daily.     ??? venetoclax (VENCLEXTA) 100 mg tablet Take 4 tablets (400 mg total) by mouth daily. Take with a meal and water. Do not chew, crush, or break tablets. 120 tablet 1     No current facility-administered medications for this visit.         Allergies   No Known Allergies    Past Medical and Surgical History     Past Medical History:   Diagnosis Date   ??? 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 CKD (chronic kidney disease) stage 3, GFR 30-59 ml/min    ??? Crohn's colitis (CMS-HCC) 1986    No surgeries. Was on 6-MP for awhile; hasn't been on anything 2005.   ??? Glaucoma    ??? 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      Past Surgical History:   Procedure Laterality Date   ??? MOHS SURGERY  2016    left ear for non-melanoma skin cancer  Social History     Social History     Socioeconomic History   ??? Marital status: Married     Spouse name: Not on file   ??? Number of children: Not on file   ??? Years of education: Not on file   ??? Highest education level: Not on file   Occupational History   ??? Not on file   Tobacco Use   ??? Smoking status: Former Smoker   ??? Smokeless tobacco: Never Used   Vaping Use   ??? Vaping Use: Never used   Substance and Sexual Activity   ??? Alcohol use: Yes     Alcohol/week: 4.0 standard drinks     Types: 4 Glasses of wine per week     Comment: occasional    ??? Drug use: Not on file   ??? Sexual activity: Not on file   Other Topics Concern   ??? Not on file   Social History Narrative   ??? Not on file     Social Determinants of Health     Financial Resource Strain:    ??? Difficulty of Paying Living Expenses:    Food Insecurity:    ??? Worried About Programme researcher, broadcasting/film/video in the Last Year:    ??? Barista in the Last Year:    Transportation Needs:    ??? Freight forwarder (Medical):    ??? Lack of Transportation (Non-Medical):    Physical Activity:    ??? Days of Exercise per Week:    ??? Minutes of Exercise per Session:    Stress:    ??? Feeling of Stress :    Social Connections:    ??? Frequency of Communication with Friends and Family:    ??? Frequency of Social Gatherings with Friends and Family:    ??? Attends Religious Services:    ??? Database administrator or Organizations:    ??? Attends Banker Meetings:    ??? Marital Status:    Former Audiological scientist; retired several years ago.  Lives with his wife.        Physical Examination     There were no vitals filed for this visit.  GENERAL:  Quite thin - weight was recorded at 168 here, but 153 at home.  His waist is 32. Accompanied by his wife.  In no distress.  Pleasant and alert.   RESP: Breathing comfortably.  Speaking comfortably in full sentences.   During the 60 minute visit, 2 episodes of ~3 seconds of non-productive cough.    NEURO: A&Ox4.  CN II-XII grossly intact and symmetric.   PSYCH: Normal affect, mood is so so.  Forward thinking.  Goal-directed and linear.   EXT:  1+ pedal edema bilaterally.   SKIN:  Some bruising on arms, but no petechiae, no rashes.   .     Laboratory Testing and Imaging     Lab on 03/12/2020   Component Date Value Ref Range Status   ??? Sodium 03/12/2020 134* 135 - 145 mmol/L Final   ??? Potassium 03/12/2020 5.0  3.5 - 5.0 mmol/L Final   ??? Chloride 03/12/2020 105  98 - 107 mmol/L Final   ??? Anion Gap 03/12/2020 7  7 - 15 mmol/L Final   ??? CO2 03/12/2020 22.0  22.0 - 30.0 mmol/L Final   ??? BUN 03/12/2020 32* 7 - 21 mg/dL Final   ??? Creatinine 03/12/2020 1.86* 0.70 - 1.30 mg/dL Final   ??? BUN/Creatinine Ratio  03/12/2020 17   Final   ??? EGFR CKD-EPI Non-African American,* 03/12/2020 35* >=60 mL/min/1.49m2 Final   ??? EGFR CKD-EPI African American, Male 03/12/2020 41* >=60 mL/min/1.54m2 Final   ??? Glucose 03/12/2020 91  70 - 179 mg/dL Final   ??? Calcium 09/60/4540 8.6  8.5 - 10.2 mg/dL Final   ??? Albumin 98/07/9146 3.5  3.5 - 5.0 g/dL Final   ??? Total Protein 03/12/2020 8.1  6.5 - 8.3 g/dL Final   ??? Total Bilirubin 03/12/2020 0.8  0.0 - 1.2 mg/dL Final   ??? AST 82/95/6213 55  19 - 55 U/L Final   ??? ALT 03/12/2020 10  <50 U/L Final   ??? Alkaline Phosphatase 03/12/2020 114  38 - 126 U/L Final   ??? WBC 03/12/2020 67.0* 4.5 - 11.0 10*9/L Final   ??? RBC 03/12/2020 3.82* 4.50 - 5.90 10*12/L Final   ??? HGB 03/12/2020 9.9* 13.5 - 17.5 g/dL Final   ??? HCT 08/65/7846 33.1* 41.0 - 53.0 % Final   ??? MCV 03/12/2020 86.6  80.0 - 100.0 fL Final   ??? MCH 03/12/2020 26.0  26.0 - 34.0 pg Final   ??? MCHC 03/12/2020 30.0* 31.0 - 37.0 g/dL Final   ??? RDW 96/29/5284 20.0* 12.0 - 15.0 % Final   ??? MPV 03/12/2020 9.8  7.0 - 10.0 fL Final   ??? Platelet 03/12/2020 369  150 - 440 10*9/L Final   ??? nRBC 03/12/2020 1  <=4 /100 WBCs Final   ??? Variable HGB Concentration 03/12/2020 Slight* Not Present Final   ??? Microcytosis 03/12/2020 Slight* Not Present Final   ??? Anisocytosis 03/12/2020 Moderate* Not Present Final   ??? Hypochromasia 03/12/2020 Marked* Not Present Final   ??? Neutrophils % 03/12/2020 65  % Final   ??? Lymphocytes % 03/12/2020 5  % Final   ??? Monocytes % 03/12/2020 8  % Final   ??? Eosinophils % 03/12/2020 0  % Final   ??? Basophils % 03/12/2020 3  % Final   ??? Blasts % 03/12/2020 19* <=0 % Final   ??? Absolute Neutrophils 03/12/2020 43.6* 2.0 - 7.5 10*9/L Final   ??? Absolute Lymphocytes 03/12/2020 3.4  1.5 - 5.0 10*9/L Final   ??? Absolute Monocytes 03/12/2020 5.4* 0.2 - 0.8 10*9/L Final   ??? Absolute Eosinophils 03/12/2020 0.0  0.0 - 0.4 10*9/L Final   ??? Absolute Basophils 03/12/2020 2.0* 0.0 - 0.1 10*9/L Final   ??? Smear Review Comments 03/12/2020 See Comment* Undefined Final    Slide reviewed. Blasts Present. Promyelocytes present-rare. Myelocytes present. Large platelets present.   ??? Polychromasia 03/12/2020 Moderate* Not Present Final   ??? Basophilic Stippling 03/12/2020 Present* Not Present Final   Lab Requisition on 02/28/2020   Component Date Value Ref Range Status   ??? Final Diagnosis 02/28/2020    Final                    Value:This result contains rich text formatting which cannot be displayed here.   ??? Comment 02/28/2020    Final                    Value:This result contains rich text formatting which cannot be displayed here.   ??? Clinical History 02/28/2020    Final                    Value:This result contains rich text formatting which cannot be displayed here.   ??? Gross Description 02/28/2020    Final  Value:This result contains rich text formatting which cannot be displayed here.   ??? Microscopic Description 02/28/2020    Final                    Value:This result contains rich text formatting which cannot be displayed here.   ??? Ancillary Studies 02/28/2020    Final                    Value:This result contains rich text formatting which cannot be displayed here.   ??? Disclaimer 02/28/2020    Final                    Value:This result contains rich text formatting which cannot be displayed here.   Lab on 03/06/2020   Component Date Value Ref Range Status   ??? Sodium 03/06/2020 134* 135 - 145 mmol/L Final   ??? Potassium 03/06/2020 5.5* 3.5 - 5.0 mmol/L Final   ??? Chloride 03/06/2020 104  98 - 107 mmol/L Final   ??? Anion Gap 03/06/2020 6* 7 - 15 mmol/L Final   ??? CO2 03/06/2020 24.0  22.0 - 30.0 mmol/L Final   ??? BUN 03/06/2020 38* 7 - 21 mg/dL Final   ??? Creatinine 03/06/2020 2.14* 0.70 - 1.30 mg/dL Final   ??? BUN/Creatinine Ratio 03/06/2020 18   Final   ??? EGFR CKD-EPI Non-African American,* 03/06/2020 30* >=60 mL/min/1.2m2 Final   ??? EGFR CKD-EPI African American, Male 03/06/2020 34* >=60 mL/min/1.55m2 Final   ??? Glucose 03/06/2020 77  70 - 179 mg/dL Final   ??? Calcium 16/06/9603 8.9  8.5 - 10.2 mg/dL Final   ??? Albumin 54/05/8118 3.7  3.5 - 5.0 g/dL Final   ??? Total Protein 03/06/2020 8.2  6.5 - 8.3 g/dL Final   ??? Total Bilirubin 03/06/2020 0.9  0.0 - 1.2 mg/dL Final   ??? AST 14/78/2956 72* 19 - 55 U/L Final   ??? ALT 03/06/2020 13  <50 U/L Final   ??? Alkaline Phosphatase 03/06/2020 100  38 - 126 U/L Final   ??? Ferritin 03/06/2020 141.0  27.0 - 377.0 ng/mL Final   ??? LDH 03/06/2020 3,620* 338 - 610 U/L Final   ??? Uric Acid 03/06/2020 6.9  4.0 - 9.0 mg/dL Final   ??? Hep B Core Total Ab 03/06/2020 Nonreactive  Nonreactive Final   ??? Hep B S Ab 03/06/2020 Nonreactive  Nonreactive, Grayzone Final    Nonreactive and Grayzone results are considered non-immune.   ??? Hep B Surf Ab Quant 03/06/2020 <8.00  <8.00 m(IU)/mL Final   ??? Hep B Surface Ag 03/06/2020 Nonreactive  Nonreactive Final   ??? Hepatitis C Ab 03/06/2020 Nonreactive  Nonreactive Final    Antibodies to HCV were not detected.  A nonreactive result does not exclude the possibility of exposure to HCV.   ??? Special Collection 03/06/2020 Collected   Final   ??? WBC 03/06/2020 53.2* 4.5 - 11.0 10*9/L Final   ??? RBC 03/06/2020 3.65* 4.50 - 5.90 10*12/L Final   ??? HGB 03/06/2020 9.8* 13.5 - 17.5 g/dL Final   ??? HCT 21/30/8657 31.5* 41.0 - 53.0 % Final   ??? MCV 03/06/2020 86.3  80.0 - 100.0 fL Final   ??? MCH 03/06/2020 26.8  26.0 - 34.0 pg Final   ??? MCHC 03/06/2020 31.1  31.0 - 37.0 g/dL Final   ??? RDW 84/69/6295 20.3* 12.0 - 15.0 % Final   ??? MPV 03/06/2020 9.6  7.0 - 10.0 fL Final   ???  Platelet 03/06/2020 387  150 - 440 10*9/L Final   ??? nRBC 03/06/2020 3  <=4 /100 WBCs Final   ??? Variable HGB Concentration 03/06/2020 Slight* Not Present Final   ??? Microcytosis 03/06/2020 Moderate* Not Present Final   ??? Anisocytosis 03/06/2020 Moderate* Not Present Final   ??? Hypochromasia 03/06/2020 Marked* Not Present Final   ??? Case Report 03/06/2020    Final                    Value:Molecular Genetics Report                         Case: [id number]                                 Authorizing Provider:  [name], MD  Collected:           03/06/2020 0743              Ordering Location:     [facility]    Received:            03/06/2020 0754                                     [facility] [address] [place]                                                     Pathologist:           Claudia Desanctis,                                                                            MD                                                                           Specimen:    Blood                                                                                     ??? FLT3-ITD Mutation  03/06/2020 Negative   Final   ??? FLT3-TKD D835 or I836 mutation  03/06/2020 Negative   Final   ??? FLT3 DNA Analysis Results 03/06/2020    Final  Value:This result contains rich text formatting which cannot be displayed here.   ??? Collection 03/06/2020 Collected   Final   ??? Final Diagnosis 03/06/2020    Final                    Value:This result contains rich text formatting which cannot be displayed here.   ??? Comment 03/06/2020    Final                    Value:This result contains rich text formatting which cannot be displayed here.   ??? Clinical History 03/06/2020    Final                    Value:This result contains rich text formatting which cannot be displayed here.   ??? Gross Description 03/06/2020    Final                    Value:This result contains rich text formatting which cannot be displayed here.   ??? Microscopic Description 03/06/2020    Final                    Value:This result contains rich text formatting which cannot be displayed here.   ??? Disclaimer 03/06/2020    Final                    Value:This result contains rich text formatting which cannot be displayed here.   ??? Flow Cytometry Summary 03/06/2020    Final Value:Flow Cytometric Immunophenotyping Results:    Peripheral blood  Heme WBC  49,190 cells/uL     Flow Differential (% of Total)  Viability:   N/A  Total of Markers Charged 31  Markers-1 Charged  30    Lymphocytes  3  Monocytes  4   Neutrophils  64  Eosinophils  1  Basophils  4  NRBC's  2  Debris   2  Blasts/Immature cells 17     Gated Population:  17% Blasts    Description of Gated Cells (% of Gated)     B-Cell Markers:    CD19   <1%  CD20   <1%  CD22   <1%  Kappa   <1%  Lambda  <1%    T-Cell Markers:    CD2   <1%  sCD3   <1%  sCD3/CD4  <1%  Total CD4  <1%  CD5   <1%  CD7   <1/1%  sCD3/CD8  <1%  Total CD8  <1%  cyCD3   <1%    Myeloid Markers:    CD11b   4%  CD13   97%  CD14    <1%  CD15    4%  CD33    <1%  CD64    1%  CD117   92%  MPO    4%    Miscellaneous:    CD10    <1%  CD11c   6%  CD34    97%  CD38    45%  CD45    100%  CD56    1%  CD57    <1%  CD71    14%  HLA-DR   97%  CD3-/CD16+56  2%  Total CD16+56  2%  TdT    <1%    Flow Cytometry Interpretation:  Flow cytometric analysis of the  peripheral blood reveals an elevated white blood cell count (49,190 cells/uL) with cells differentiated as indicated.    Several populations are analyzed.    The first population of 17% cells is gated on the immature cell region characterized by dim CD45 expression and low side scatter. Cells within this region express CD13, CD34, CD117, and HLA-DR, and lack CD33, B- and T-cell antigens, and other informative markers.     This immunophenotype is characteristic of myeloblasts, representing 17% of the peripheral white blood cells.    Separate analysis of the lymphocyte region (data chart not shown) reveals 74% T-cells (CD4: CD8 ratio is 1.4:1) without an aberrant immunophenotype, 11% polytypic B-cells, and 2% NK-cells.    Separate analysis of the monocytic cell region (data chart not shown) reveals primarily mature monocytes with no overt immunophenotypic aberrancy.     This test, utilizing analyte-specific reagents (ASR) was developed and its performance characteristics determined by                           the [facility].  It has not been cleared or approved by the U.S. Food and Drug Administration (FDA).  The FDA has determined that such clearance or approval is not necessary.  This test is used for clinical purposes.  It should not be regarded as investigational or for research.   ??? Pathologist Smear Interpretation 03/06/2020 Confirmed by Hemepath Specialist/Senior Tech  Confirmed by Hemepath Fellow, Confirmed by Hemepath Specialist/Senior Tech, Confirmed by Core Specialist/Senior Tech, To Be Accessioned - Case Report to Follow Final   ??? RESULTS 03/06/2020    Preliminary                    Value:This result contains rich text formatting which cannot be displayed here.   ??? Interpretation 03/06/2020    Preliminary                    Value:This result contains rich text formatting which cannot be displayed here.   ??? No.Cells 03/06/2020 14   Preliminary   ??? Culture(s) Examined 03/06/2020 2   Preliminary   ??? Cells Analyzed 03/06/2020 8   Preliminary   ??? Cells Karyotyped 03/06/2020 6   Preliminary   ??? Stain(s) Used 03/06/2020 G-Bands and FISH   Preliminary   ??? Indication for Study 03/06/2020    Preliminary                    Value:This result contains rich text formatting which cannot be displayed here.   ??? Neutrophils % 03/06/2020 65  % Final   ??? Lymphocytes % 03/06/2020 7  % Final   ??? Monocytes % 03/06/2020 7  % Final   ??? Eosinophils % 03/06/2020 1  % Final   ??? Basophils % 03/06/2020 3  % Final   ??? Blasts % 03/06/2020 17* <=0 % Final   ??? Absolute Neutrophils 03/06/2020 34.6* 2.0 - 7.5 10*9/L Final   ??? Absolute Lymphocytes 03/06/2020 3.7  1.5 - 5.0 10*9/L Final   ??? Absolute Monocytes 03/06/2020 3.7* 0.2 - 0.8 10*9/L Final   ??? Absolute Eosinophils 03/06/2020 0.5* 0.0 - 0.4 10*9/L Final   ??? Absolute Basophils 03/06/2020 1.6* 0.0 - 0.1 10*9/L Final   ??? Smear Review Comments 03/06/2020 See Comment* Undefined Final    Slide Reviewed.  Blasts Present.  Promyelocytes present - rare.  Myelocytes present. Large platelets present.    ???  Polychromasia 03/06/2020 Slight* Not Present Final   ??? Basophilic Stippling 03/06/2020 Present* Not Present Final

## 2020-03-12 NOTE — Progress Notes (Signed)
[facility]  [address], [address] [place], [place] [address] Phone: [phone]  Fax: [phone]   Clinic Day:  03/14/2020  Referring physician: [name], MD  Chief Complaint: Ruben [name] is a 74 y.o. 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   HPI: The patient was last seen in the hematology clinic on 03/07/2020. At that time, he was doing "ok".  He had a cough and was on another round of azithromycin.  Three options were discussed:  1) Do nothing, 2) intensive chemotherapy with admission to the hospital for CPX-351 chemotherapy , and 3) lower dose chemotherapy with decitabine and venetoclax.  He was on venetoclax 200 mg a day and hydroxyurea 500 mg q day.  We discussed central line placement.  He met with Eli Phillips at [facility] on 03/12/2020.  He was doing "fair".  He was off antibiotics.  Fedratinib was stopped.  Hydroxyurea was increased to 1000 mg BID.  PIC line placement was discussed.  Chest CT was recommended to evaluate his pneumonia.  He should have labs in 1 week.  He was to contact [facility] when he was ready to start treatment.  Labs on 03/12/2020 revealed a hematocrit 33.1, hemoglobin 9.9, MCV 86.6, platelets 369,000, WBC 67,000 with 19% blasts.  Creatinine was 1.86 and potassium 5.0. Ruben [name] sent a message via [place] on 03/12/2020 and explained that he and his wife need more time to discuss cancer treatment options. They are planning to take a couple of weeks to get away and review their options.   During the interim, he has been alright. He still has an occasional cough and diarrhea. He and his wife would like to put everything on hold until the week of 03/26/2020 because he is not positive he wants to pursue treatment. They were planning on going to the beach but they couldn't find a place to stay so they will be staying close by. He will monitor blood counts and transfuse if  needed. Patient is agreeable to a chest CT this week.   Past Medical History:  Diagnosis Date  . Blood dyscrasia   . BPH (benign prostatic hyperplasia)   . Cancer ([place])    SKIN/ POLYCYTHEMIA VERA  . Chronic kidney disease    RENAL INSUFF (40%)  . Crohn disease ([place])   . Crohn's disease ([place])   . Glaucoma   . Glaucoma   . Gout   . History of chicken pox   . Myelofibrosis ([place])   . Nosebleed   . RBBB   . Right bundle branch block     Past Surgical History:  Procedure Laterality Date  . CATARACT EXTRACTION W/PHACO Left 07/13/2018   Procedure: CATARACT EXTRACTION PHACO AND INTRAOCULAR LENS PLACEMENT (IOC);  Surgeon: [name], MD;  Location: [facility] ORS;  Service: Ophthalmology;  Laterality: Left;  [place] 00:39  CDE 5.14 Fluid pack lot # [id number]  . CATARACT EXTRACTION W/PHACO Right 08/03/2018   Procedure: CATARACT EXTRACTION PHACO AND INTRAOCULAR LENS PLACEMENT (IOC);  Surgeon: [name], MD;  Location: [facility] ORS;  Service: Ophthalmology;  Laterality: Right;  [place] 00:43.4 CDE 5.23 Fluid pack Lot # [id number]  . COLONOSCOPY WITH PROPOFOL N/A 03/27/2017   Procedure: COLONOSCOPY WITH PROPOFOL;  Surgeon: [name], MD;  Location: [facility] ENDOSCOPY;  Service: Endoscopy;  Laterality: N/A;  . MOHS SURGERY     EAR  . PROSTATE SURGERY  2008   Prostate Biopsy in  [place] to elevated PSA.  per patient normal  . Skin Lesion Basal cell removed    . wart removal     from eyelid    Family History  Problem Relation Age of Onset  . Cancer Mother        Liver  . Cancer Father        Prostate  . Cancer Sister        Hodgkins lymphoma  . Cancer Paternal Aunt        Breast    Social History:  reports that he quit smoking about 16 years ago. His smoking use included cigarettes. He has a 5.00 pack-year smoking history. He has never used smokeless tobacco. He reports current alcohol use of about 4.0 - 5.0 standard drinks of alcohol per week. He reports that he does not use  drugs. The patientpreviously worked for [employer]. He no longer volunteers for [employer]. The patient's wife isJudith([phone]).He lives in [place]. The patient is accompanied by his wife today.  Allergies: No Known Allergies  Current Medications: Current Outpatient Medications  Medication Sig Dispense Refill  . acetaminophen (TYLENOL) 500 MG tablet Take 500 mg by mouth daily as needed for moderate pain.    Marland Kitchen allopurinol (ZYLOPRIM) 300 MG tablet TAKE 1/2 TABLET (150 MG TOTAL) BY MOUTH DAILY. 45 tablet 1  . Cholecalciferol (VITAMIN D3) 1000 units CAPS Take 1,000 Units by mouth daily at 12 noon.     . Cyanocobalamin (RA VITAMIN B-12 TR) 1000 MCG TBCR Take 1,000 mcg by mouth daily at 12 noon.     . finasteride (PROSCAR) 5 MG tablet Take 1 tablet (5 mg total) by mouth every evening. 90 tablet 3  . hydroxyurea (HYDREA) 500 MG capsule Take 1,000 mg by mouth 2 (two) times daily.     Marland Kitchen latanoprost (XALATAN) 0.005 % ophthalmic solution Place 1 drop into both eyes at bedtime.     . Multiple Vitamins-Minerals (CENTRUM SILVER ULTRA MENS) TABS Take 1 tablet by mouth daily at 12 noon.     . Omega-3 Fatty Acids (KP FISH OIL) 1200 MG CAPS Take 1,200 mg by mouth daily.     . sodium bicarbonate 650 MG tablet Take 650 mg by mouth 2 (two) times daily.    . timolol (TIMOPTIC) 0.5 % ophthalmic solution Place 1 drop into both eyes daily. In AM  5  . albuterol (VENTOLIN HFA) 108 (90 Base) MCG/ACT inhaler Inhale 2 puffs into the lungs every 6 (six) hours as needed for wheezing or shortness of breath. (Patient not taking: Reported on 03/07/2020) 18 g 0  . amoxicillin-clavulanate (AUGMENTIN) 875-125 MG tablet Take 1 tablet by mouth 2 (two) times daily. (Patient not taking: Reported on 03/14/2020) 20 tablet 0  . aspirin 81 MG chewable tablet Chew by mouth. (Patient not taking: Reported on 03/07/2020)    . azithromycin (ZITHROMAX) 250 MG tablet Take 500 mg (2 tabs) day 1 and 250 mg (1 tab) days 2-5.  (Patient not taking: Reported on 03/14/2020) 6 each 0  . clobetasol ointment (TEMOVATE) 0.05 %  (Patient not taking: Reported on 03/07/2020)    . Fedratinib HCl (INREBIC) 100 MG CAPS Take 200 mg by mouth daily.  (Patient not taking: Reported on 03/14/2020)    . folic acid (FOLVITE) 1 MG tablet Take 1 mg by mouth daily at 12 noon.  (Patient not taking: Reported on 03/14/2020)    . neomycin-polymyxin-dexamethasone (MAXITROL) 0.1 % ophthalmic suspension APPLY 1 DROP INTO RIGHT EYE THREE TIMES  A DAY (Patient not taking: Reported on 03/07/2020)    . venetoclax 100 MG TABS Take by mouth. (Patient not taking: Reported on 03/07/2020)     No current facility-administered medications for this visit.    Review of Systems  Constitutional: Negative for chills, diaphoresis, fever, malaise/fatigue and weight loss.       Doing alright.  HENT: Negative.  Negative for congestion, ear discharge, ear pain, nosebleeds, sinus pain and sore throat.   Eyes: Negative for blurred vision and double vision.        S/p cataract surgery. Uses reading glasses.  Respiratory: Positive for cough (occasional). Negative for hemoptysis, shortness of breath and wheezing.   Cardiovascular: Negative for chest pain, palpitations and leg swelling.  Gastrointestinal: Positive for diarrhea (occasional). Negative for abdominal pain, constipation, heartburn, nausea and vomiting.       Crohn's (no symptoms).  Genitourinary: Negative.  Negative for dysuria, frequency and urgency.  Musculoskeletal: Negative for back pain, falls and myalgias.  Skin: Negative.  Negative for itching and rash.  Neurological: Negative for dizziness, tremors, sensory change, speech change, weakness and headaches.  Endo/Heme/Allergies: Positive for environmental allergies (seasonal). Does not bruise/bleed easily.  Psychiatric/Behavioral: Positive for memory loss (mild). Negative for depression. The patient is not nervous/anxious.   All other systems reviewed and are  negative.  Performance status (ECOG): 1-2  Vitals There were no vitals taken for this visit.   Physical Exam Vitals and nursing note reviewed.  Constitutional:      General: He is not in acute distress.    Appearance: Normal appearance. He is not diaphoretic.     Interventions: Face mask in place.     Comments: Chronically fatigued appearing.  HENT:     Head: Normocephalic and atraumatic.     Mouth/Throat:     Mouth: No oral lesions.     Pharynx: No oropharyngeal exudate.  Eyes:     General: No scleral icterus.    Conjunctiva/sclera: Conjunctivae normal.     Pupils: Pupils are equal, round, and reactive to light.     Comments: Glasses.  Blue eyes.   Neck:     Vascular: No JVD.  Cardiovascular:     Rate and Rhythm: Normal rate and regular rhythm.     Pulses: Normal pulses.     Heart sounds: Normal heart sounds. No murmur heard.  No friction rub. No gallop.   Pulmonary:     Effort: Pulmonary effort is normal. No respiratory distress.     Breath sounds: Normal breath sounds. No wheezing, rhonchi or rales.  Chest:     Chest wall: No tenderness.  Abdominal:     General: Bowel sounds are normal. There is no distension.     Palpations: Abdomen is soft. There is no hepatomegaly, splenomegaly or mass.     Tenderness: There is no abdominal tenderness. There is no guarding or rebound.     Comments: Spleen 1 fingerbreadth above umbilicus  Musculoskeletal:        General: No tenderness. Normal range of motion.     Cervical back: Normal range of motion and neck supple.  Lymphadenopathy:     Head:     Right side of head: No preauricular, posterior auricular or occipital adenopathy.     Left side of head: No preauricular, posterior auricular or occipital adenopathy.     Cervical: No cervical adenopathy.     Upper Body:     Right upper body: No supraclavicular adenopathy.     Left  upper body: No supraclavicular adenopathy.     Lower Body: No right inguinal adenopathy. No left  inguinal adenopathy.  Skin:    General: Skin is warm and dry.     Coloration: Skin is not pale.     Findings: No bruising, erythema, lesion or rash.     Comments: Prominence over left eye.  Neurological:     Mental Status: He is alert and oriented to person, place, and time.  Psychiatric:        Behavior: Behavior normal.        Thought Content: Thought content normal.        Judgment: Judgment normal.    No visits with results within 3 Day(s) from this visit.  Latest known visit with results is:  Orders Only on 03/02/2020  Component Date Value Ref Range Status  . Uric Acid, Serum 03/02/2020 7.4  3.7 - 8.6 mg/dL Final   Performed at [facility], [address] [place], [place] [address]    Assessment:  HAYDON DORRIS is a 74 y.o. 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 He has had polycythemia dating back to 2013. Hematocrit was 62.1 with a hemoglobin of 19.8 on 03/28/2015. JAK 2 testing on 03/28/2015 revealed [id number] mutation. Erythropoietin level was 1.1 (low). 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  He began a phlebotomyprogram on 03/28/2015 to maintain a hematocrit goal of <45.Last phlebotomywason 05/02/2015. He is on a baby aspirin.  CBC on 11/28/2015 revealed a hematocrit of 39.3, hemoglobin 11.9, platelets 463,000, WBC 32,000 with an ANC of 25,000. Differential included 71% segs, 4% lymphocytes and 17% monocytes. Peripheral smear revealed leukocytosis with predominantly mature neutrophils, increased monocytes and rare blasts (<1%).  Bone marrowon 12/14/2015 revealed a persistent myeloproliferative neoplasm with myelofibrosis and alterations compatible with myelodysplatic progression. Marrow was packed (95-100% cellularity) with pan myelosis, multi-lineage dyspoiesis, and no significant increase in blasts. There was moderate to focally marked reticulin fibrosis (grade 2-3/3). Storage iron was not identified. Flow cytometry  revealed non-specific atypical myeloid findings with no increase in blasts. 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 Cytogenetics were normal (46, XY).  Bone marrowon 04/07/2016 at [facility] revealed a hypercellular marrow (>95%) with persistent involvement by myeloid neoplasm with 5% blasts. There was mild reticulin fibrosis. FISH t(9;22) results were normal. 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 Cytogenetics are pending.  Bone marrowon 08/09/2018 revealed a hypercellular bone marrow (>95%) with persistent involvement by myeloid neoplasm with <1% blasts by manual touch preparation differential. There was marked reticulin fibrosis with focal collagen deposition. Cytogenetics were normal (46, XY). - Myeloid mutation panel study is pending.  Bone marrowon 07/14/2019 revealed a hypercellular marrow (>95%) with persistent involvement by a myeloid neoplasm with extensive fibrosis and rare (< 5%) CD34 +blast byIHC. Myeloid mutation panel revealed the patient's previously reported variants inIDH2, JAK2, KRAS, NRAS and SRSF2 were again identified. There are no new variants. Cytogenetics are pending.  He was briefly on hydroxyurea500 mg a day (12/05/2015 - 12/19/2015 and 06/25/2016 - 07/03/2016). Platelet count increased to 1.18 million and white count to 54,800 without increased blasts on 06/25/2016. CBC on 07/02/2016 revealed a platelets of 429,000 with a white count 30,600. He began allopurinolon 12/19/2015. He began Jakafi 20 mg BID on 05/28/2016. He has been onJakafi25 mg BID since09/12/2016.  LDHis followed: 466 on 01/02/2016, 596 on 05/23/2016, 663 on 07/30/2016, 522 on 09/24/2016, 638 on 01/05/2017,  625 on 02/25/2017, 1144 on 06/10/2017, 720 on 06/17/2017, 728 on 06/24/2017, 724 on 01/06/2018, 616 on 04/07/2018, 671 on 07/07/2018, 820 on  10/06/2018, 702 on 01/04/2019, 10821 on 04/06/2019, 686 on 06/02/2019, 888 on 08/24/2019, 656 on 11/01/2019, 432 and 12/27/2019 and 910 on 01/24/2020. Triglycerideswere 183 (<150) on 08/20/2016, 180 on 01/05/2017, 312 on 06/17/2017, 165 on 11/25/2017, 113 on 02/17/2018.  He has urinary retention. Renal ultrasoundon 07/28/2016 revealed bilateral hydronephrosis (right >left) and a large postvoid residual (2 liters) suggesting bladder dysfunction or outlet obstruction. He underwent temporary Foley catheter placement. He performs I/O self catheterizations. He is on Flomax and finasteride. PSAwas 2.02 on 07/09/2016.  He is followed by GI (Dr. Vira Agar) for a history of polypsand Crohn's disease.He had an unremarkable colonoscopy in 03/2017.AbdomenandpelvisCTon 04/03/2019 revealed mild wall thickening of portions of a loop of small bowel within the pelvis with intervening areas of mildly dilated small bowel,consistent with active Crohn's disease. No evidence of an abscess or fistula. No other evidence of bowel inflammation.There was splenomegaly(16.7 x 7.7 x 15.6 cm), developed since12/11/2011.There was mild bladder wall thickeningandenlargement of the prostate. Chronic bladder outlet obstructionwas felt to bethe etiology of bladder wall thickening.  He has chronicrenal insufficiency(Cr 1.85; CrCl 36 ml/min). SPEP on 09/23/2016 was negative. Spot urine revealed 17.4% of 27.3 mg/dL of a monoclonal protein. 24 hour urine on 10/16/2016 revealed no monoclonal protein. Renal function transiently decreased in 06/2017 after Bactrim. 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  He is followed by nephrology and urology.  He has a history of epistaxis. Normal studies included: PT, PTT, von Willebrand panel, and platelet function assay (PFA).  He has right foot drop. 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 There was no  non-contrast MRI evidence of brain metastasis. There was only mild for age nonspecific cerebral white signal changes. Lumbar spine MRIon 06/09/2019 showed diffusely abnormal marrow signal in the visible skeleton, as seen on the brain MRI. There was no pathologic fracture and no extraosseous extension of tumor on the lumbar spine or visible sacrum. In conjunction with the splenomegaly by CT in [place] infiltration byleukemia/lymphoma may be the most likely etiology. Differential considerations included multiple myeloma and metastatic disease unknown primary. The involvement of the pelvis should be amenable to bone marrow biopsy.There was superimposed mild for age lumbar spine degeneration with no spinal stenosis.There was mild degenerative L4 and L5 neural foraminal stenosis.  He was diagnosed with bilateral pneumonia on 02/29/2020.  He is on Augmentin and azithromycin.    He received bothCOVID-19vaccinesin 10/2019 (first on 10/28/2019).  COVID-19 testing was negative on 02/29/2020.  Symptomatically, he feels "alright".  He has an occasional cough and diarrhea.  He would like some time off to consider if he wishes to pursue treatment.  Plan: 1.   Review interim labs. 2. 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 Hematocrit 28.9. Hemoglobin 8.9. Platelets401,000. KTG25,638 ([id number]) on 03/02/2020.   Peripheral smear revealed 18% blasts. 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 He is tapering on his fedratinib.  He is on hydroxyurea 100 mg a day.  Review treatment options as well as supportive care.  He is deciding if he wishes to pursue therapy. 3. Pneumonia Patient has been on seceral courses of antibiotics.   Patient agreeable to chest CT. 4.   Chest CT- f/u pneumonia. 5.   RTC on 06/28 for labs (CBC with diff, CMP, uric acid, type and screen). 6.    RTC on 03/27/2020 for  MD assessment, labs (CBC with diff, CMP, uric, type and screen), and review chest CT.  I discussed the assessment and treatment plan with the patient.  The patient was provided an opportunity to ask questions and all were answered.  The patient agreed with the plan and demonstrated an understanding of the instructions.  The patient was advised to call back if the symptoms worsen or if the condition fails to improve as anticipated.  I provided 15 minutes of face-to-face time during this encounter and > 50% was spent counseling as documented under my assessment and plan.  An additional 5-6 minutes were spent reviewing his chart (Epic and Care Everywhere) including notes, labs, and imaging studies.     Lequita Asal, MD, PhD    03/14/2020, 9:37 AM  I, Mirian Mo Tufford, am acting as Education administrator for [employer]. Mike Gip, MD, PhD.  I, Deaundra Dupriest C. Mike Gip, MD, have reviewed the above documentation for accuracy and completeness, and I agree with the above.

## 2020-03-13 NOTE — Unmapped (Signed)
[facility]   Patient Onboarding/Medication Counseling    The patient declined counseling on medication administration, missed dose instructions, goals of therapy, side effects and monitoring parameters, warnings and precautions, drug/food interactions and storage, handling precautions, and disposal because they were counseled in clinic. The information in the declined sections below are for informational purposes only and was not discussed with patient.     Mr.Kinkaid is a 74 y.o. 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  I am speaking to the patient.    Was a Nurse, learning disability used for this call? No    Verified patient's date of birth / HIPAA.    Specialty medication(s) to be sent: Hematology/Oncology: Venclexta    Non-specialty medications/supplies to be sent: none    Medications not needed at this time: none       Venclexta (Venetoclax)    Medication & Administration     Dosage: Take 4 tablets (400 mg total) by mouth daily. Take with a meal and water. Do not chew, crush, or break tablets.    Administration:   ??? Take with a meal.  ??? Swallow whole with a glass of water. Do not break, crush or chew.    ??? Drink plenty of water when taking this drug unless told to drink less water by your doctor. Drink 6 to 8 glasses (about 56 ounces total) of water each day. Start doing this 2 days before your first dose.   ??? Do not stop taking unless instructed to do so by your doctor.    ??? If you throw up after taking a dose, do not repeat the dose. Take your next dose at your normal time.  Adherence/Missed dose instructions: Take a missed dose as soon as you think about.  If it has been more than 8 hours since the missed dose, skip the missed dose and go back to your normal time. Do not take 2 doses at the same time or extra doses.  Goals of Therapy     ??? To prevent disease progression    Side Effects & Monitoring Parameters     Commonly reported side effects  ??? Abdominal pain   ??? Common cold symptoms  ??? Fatigue, loss of strength and energy  ??? Constipation  ??? Muscle pain, joint pain, back pain  ??? Mouth irritation, mouth sores  ??? Restlessness, difficulty seeping    The following side effects should be reported to the provider:  ??? Signs of infection (fever >100.4, chills, mouth sores, sputum production)  ??? Signs of bleeding (vomiting or coughing up blood, blood that looks like coffee grounds, blood in the urine or black, red tarry stools, bruising that gets bigger without reason, any persistent or severe bleeding, impaired wound healing)  ??? Signs of low blood sugar (dizziness, headache, fatigue, feeling weak, shaking, fast heartbeat, confusion, increased hunger or sweating)  ??? Signs of high blood sugar (confusion, fatigue increased thirst, increased hunger, passing a lot of urine, flushing, fast breathing or breath that smells like fruit)  ??? Signs of fluid and electrolyte problems (mood changes, confusion, muscle pain or weakness, abnormal/fast heartbeat, severe dizziness, passing out)  ??? Signs of tumor lysis syndrome (fast heartbeat or abnormal heartbeat; any passing out; unable to pass urine; muscle weakness or cramps; nausea, vomiting, diarrhea or lack of appetite; or feeling sluggish)  ??? Severe headache, dizziness, passing out  ??? Vision changes  ??? Severe loss of energy and strength  ???  Dark urine, urine discoloration, yellow eyes or skin  ??? Severe nausea, vomiting, inability to eat  ??? Severe diarrhea   ??? Shortness of breath  ??? Swelling  ??? Bruising  ??? Pale skin  ??? Signs of anaphylaxis (wheezing, chest tightness, swelling of face, lips, tongue or throat)    Monitoring Parameters:   ??? CBC with differential (throughout treatment)  ??? Blood chemistries (potassium, uric acid, phosphorus, calcium, and creatinine)  ??? Pregnancy test (prior to treatment in females of reproductive potential)  ??? Assess tumor burden, including radiographic evaluation (eg, CT scan) for tumor lysis syndrome (TLS) risk evaluation;   ??? Monitor for signs/symptoms of infection.   ??? Monitor adherence.    Contraindications, Warnings, & Precautions   Contraindications:  ??? Concomitant use with strong CYP3A inhibitors at initiation and during ramp-up phase in patients with chronic lymphocytic leukemia/small lymphocytic lymphoma due to the potential for increased risk of tumor lysis syndrome.   Warnings & Precautions:   ??? Bone marrow suppression: Neutropenia, thrombocytopenia, and anemia may occur. Neutropenic fever has been reported both with monotherapy and combination therapy.   ??? Infection: Serious and fatal infections, including pneumonia and sepsis, have occurred.   ??? Tumor lysis syndrome: Tumor lysis syndrome including fatalities and renal failure requiring dialysis has occurred in patients with high tumor burden when treated with venetoclax.   ??? Hepatic impairment: Dosage adjustment recommended in patients with severe hepatic impairment. Monitor closely for toxicities.  ??? Multiple myeloma: In patients with relapsed or refractory multiple myeloma, an increase in mortality was noted when venetoclax was added to bortezomib and dexamethasone. Venetoclax is not approved for the treatment of multiple myeloma.  ??? Renal impairment: Patients with decreased renal function (CrCl <80 mL/minute) are at increased risk for TLS and may require more intensive TLS prophylaxis and monitoring during treatment initiation and dose escalation.  ??? Immunizations: Live vaccinations should not be administered prior to, during, or after venetoclax treatment until B-cell recovery occurs. Vaccines may be less effective.  ??? Reproductive Considerations: Females of reproductive potential should have a pregnancy test prior to therapy and use effective contraception during treatment and for at least 30 days after the final dose.  Based on the mechanism of action and data from animal reproduction studies, venetoclax is expected to cause fetal harm if administered during pregnancy.  ??? Breastfeeding considerations: It is not known if venetoclax is present in breast milk. Due to the potential for serious adverse reactions in the breastfed infant, breastfeeding is not recommended by the manufacturer.  Drug/Food Interactions     ??? Medication list reviewed in Epic. The patient was instructed to inform the care team before taking any new medications or supplements. No drug interactions.  Fedratinib has been discontinued by provider.   ??? Avoid live vaccines. (venetoclax may diminish the therapeutic effects of live vaccines and enhance the toxic/adverse effects of live vaccines.  ??? Avoid grapefruit and grapefruit juice. (may increase serum concentrations of venetoclax)  ??? Avoid [place] oranges and star fruit. (May increase serum concentrations of venetoclax)    Storage, Handling Precautions, & Disposal   ??? Store at room temperature in the original container (do not use a pillbox or store with other medications).   ??? Caregivers helping administer medication should wear gloves and wash hands immediately after.    ??? Keep the lid tightly closed. Keep out of the reach of children and pets.  ??? Do not flush down a toilet or pour down a drain unless instructed to  do so.  Check with your local police department or fire station about drug take-back programs in your area.      Current Medications (including OTC/herbals), Comorbidities and Allergies     Current Outpatient Medications   Medication Sig Dispense Refill   ??? acetaminophen (TYLENOL) 500 MG tablet Take 500 mg by mouth every eight (8) hours as needed.     ??? allopurinoL (ZYLOPRIM) 300 MG tablet Take 150 mg by mouth daily.      ??? aspirin 81 MG chewable tablet Chew 81 mg.  (Patient not taking: Reported on 03/12/2020)     ??? clobetasoL (TEMOVATE) 0.05 % ointment      ??? cyanocobalamin 1000 MCG tablet Take 1,000 mcg by mouth daily.      ??? fedratinib (INREBIC) 100 mg cap Take 4 capsules (400 mg) by mouth daily. 120 capsule 2   ??? finasteride (PROSCAR) 5 mg tablet Take 5 mg by mouth daily with evening meal.     ??? hydroxyurea (HYDREA) 500 mg capsule Take 1 capsule (500 mg total) by mouth daily. 60 capsule 2   ??? latanoprost (XALATAN) 0.005 % ophthalmic solution 1 drop nightly.     ??? sodium bicarbonate 650 mg tablet Take 650 mg by mouth Two (2) times a day.     ??? tamsulosin (FLOMAX) 0.4 mg capsule Comments:  - Filled Date: Aug 24 2016 12:00AM - Patient Notes: TAKE 1 CAPSULE BY MOUTH DAILY   Duration: 30     ??? timolol (TIMOPTIC) 0.5 % ophthalmic solution Apply 1 drop to eye daily.     ??? venetoclax (VENCLEXTA) 100 mg tablet Take 4 tablets (400 mg total) by mouth daily. Take with a meal and water. Do not chew, crush, or break tablets. (Patient not taking: Reported on 03/12/2020) 120 tablet 1     No current facility-administered medications for this visit.       No Known Allergies    Patient Active Problem List   Diagnosis   ??? Polycythemia vera (CMS-HCC)   ??? MPN (myeloproliferative neoplasm) (CMS-HCC)   ??? Iron deficiency anemia   ??? 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    Appropriateness of Therapy     Is medication and dose appropriate based on diagnosis? 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 For example, brushing your teeth or getting up in the morning. 0    Financial Information     Medication Assistance provided: Prior Authorization and [employer]    Anticipated copay of $0 / 30 days reviewed with patient. Verified delivery address.    Delivery Information     Scheduled delivery date: 03/30/20    Expected start date: 03/30/20    Medication will be delivered via [facility] to the temporary address in [place].  This shipment will not require a signature.      Explained the services we provide at [facility] and that each month we would call to set up refills.  Stressed importance of returning phone calls so that we could ensure they receive their medications in time each month.  Informed patient that we should be setting up refills 7-10 days prior to when they  will run out of medication.  A pharmacist will reach out to perform a clinical assessment periodically.  Informed patient that a welcome packet and a drug information handout will be sent.      Patient verbalized understanding of the above information as well as how to contact the pharmacy at [phone] option 4 with any questions/concerns.  The pharmacy is open Monday through Friday 8:30am-4:30pm.  A pharmacist is available 24/7 via pager to answer any clinical questions they may have.    Patient Specific Needs     - Does the patient have any physical, cognitive, or cultural barriers? No    - Patient prefers to have medications discussed with  Patient     - Is the patient or caregiver able to read and understand education materials at a high school level or above? Yes    - Patient's primary language is  English     - Is the patient high risk? Yes, patient is taking oral chemotherapy. Appropriateness of therapy has been assessed.     - Does the patient require a Care Management Plan? No     - Does the patient require physician intervention or other additional services (i.e. nutrition, smoking cessation, social work)? No      Vidyuth Belsito [facility] Specialty Pharmacist

## 2020-03-13 NOTE — Unmapped (Signed)
[facility] Specialty Medication Onboarding    Specialty Medication: Venclexta 100mg  tablets  Prior Authorization: Approved   Financial Assistance: Yes - grant approved as secondary   Final Copay/Day Supply: $0 / 30 days    Insurance Restrictions: Yes - max 1 month supply     Notes to Pharmacist:     The triage team has completed the benefits investigation and has determined that the patient is able to fill this medication at [facility]. Please contact the patient to complete the onboarding or follow up with the prescribing physician as needed.

## 2020-03-14 ENCOUNTER — Inpatient Hospital Stay: Payer: Medicare Other

## 2020-03-14 ENCOUNTER — Other Ambulatory Visit: Payer: Self-pay

## 2020-03-14 ENCOUNTER — Encounter: Payer: Self-pay | Admitting: Hematology and Oncology

## 2020-03-14 ENCOUNTER — Inpatient Hospital Stay (HOSPITAL_BASED_OUTPATIENT_CLINIC_OR_DEPARTMENT_OTHER): Payer: Medicare Other | Admitting: Hematology and Oncology

## 2020-03-14 DIAGNOSIS — Z7189 Other specified counseling: Secondary | ICD-10-CM | POA: Diagnosis not present

## 2020-03-14 DIAGNOSIS — D649 Anemia, unspecified: Secondary | ICD-10-CM | POA: Diagnosis not present

## 2020-03-14 DIAGNOSIS — D45 Polycythemia vera: Secondary | ICD-10-CM

## 2020-03-14 DIAGNOSIS — J189 Pneumonia, unspecified organism: Secondary | ICD-10-CM | POA: Diagnosis not present

## 2020-03-14 DIAGNOSIS — N289 Disorder of kidney and ureter, unspecified: Secondary | ICD-10-CM | POA: Diagnosis not present

## 2020-03-14 DIAGNOSIS — C9200 Acute myeloblastic leukemia, not having achieved remission: Secondary | ICD-10-CM | POA: Diagnosis not present

## 2020-03-14 DIAGNOSIS — E871 Hypo-osmolality and hyponatremia: Secondary | ICD-10-CM | POA: Diagnosis not present

## 2020-03-14 NOTE — Progress Notes (Signed)
No new changes noted today 

## 2020-03-17 LAB — VITAMIN B1: Thiamine:SCnc:Pt:Bld:Qn:: 326 — ABNORMAL HIGH

## 2020-03-18 LAB — INTELLIGEN MYELOID

## 2020-03-19 ENCOUNTER — Inpatient Hospital Stay: Payer: Medicare Other

## 2020-03-19 ENCOUNTER — Ambulatory Visit
Admission: RE | Admit: 2020-03-19 | Discharge: 2020-03-19 | Disposition: A | Discharge status: Home / Self Care | Payer: Medicare Other | Source: Ambulatory Visit | Attending: Hematology and Oncology | Admitting: Hematology and Oncology

## 2020-03-19 ENCOUNTER — Other Ambulatory Visit: Payer: Self-pay

## 2020-03-19 ENCOUNTER — Other Ambulatory Visit: Payer: Self-pay | Admitting: Hematology and Oncology

## 2020-03-19 ENCOUNTER — Telehealth: Payer: Self-pay

## 2020-03-19 ENCOUNTER — Telehealth: Payer: Self-pay | Admitting: *Deleted

## 2020-03-19 DIAGNOSIS — J189 Pneumonia, unspecified organism: Secondary | ICD-10-CM | POA: Diagnosis not present

## 2020-03-19 DIAGNOSIS — N289 Disorder of kidney and ureter, unspecified: Secondary | ICD-10-CM | POA: Diagnosis not present

## 2020-03-19 DIAGNOSIS — C9200 Acute myeloblastic leukemia, not having achieved remission: Secondary | ICD-10-CM

## 2020-03-19 DIAGNOSIS — E871 Hypo-osmolality and hyponatremia: Secondary | ICD-10-CM | POA: Diagnosis not present

## 2020-03-19 DIAGNOSIS — D649 Anemia, unspecified: Secondary | ICD-10-CM | POA: Diagnosis not present

## 2020-03-19 DIAGNOSIS — D45 Polycythemia vera: Secondary | ICD-10-CM | POA: Diagnosis not present

## 2020-03-19 LAB — COMPREHENSIVE METABOLIC PANEL
ALT: 11 U/L (ref 0–44)
AST: 54 U/L — ABNORMAL HIGH (ref 15–41)
Albumin: 3.1 g/dL — ABNORMAL LOW (ref 3.5–5.0)
Alkaline Phosphatase: 95 U/L (ref 38–126)
Anion gap: 6 (ref 5–15)
BUN: 40 mg/dL — ABNORMAL HIGH (ref 8–23)
CO2: 25 mmol/L (ref 22–32)
Calcium: 8.6 mg/dL — ABNORMAL LOW (ref 8.9–10.3)
Chloride: 101 mmol/L (ref 98–111)
Creatinine, Ser: 1.87 mg/dL — ABNORMAL HIGH (ref 0.61–1.24)
GFR calc Af Amer: 40 mL/min — ABNORMAL LOW (ref >60)
GFR calc non Af Amer: 35 mL/min — ABNORMAL LOW (ref >60)
Glucose, Bld: 93 mg/dL (ref 70–99)
Potassium: 4.7 mmol/L (ref 3.5–5.1)
Sodium: 132 mmol/L — ABNORMAL LOW (ref 135–145)
Total Bilirubin: 0.9 mg/dL (ref 0.3–1.2)
Total Protein: 8.3 g/dL — ABNORMAL HIGH (ref 6.5–8.1)

## 2020-03-19 LAB — CBC WITH DIFFERENTIAL/PLATELET
Abs Immature Granulocytes: 3.8 10*3/uL — ABNORMAL HIGH (ref 0.00–0.07)
Band Neutrophils: 4 %
Basophils Absolute: 1.6 10*3/uL — ABNORMAL HIGH (ref 0.0–0.1)
Basophils Relative: 3 %
Blasts: 22 %
Eosinophils Absolute: 0 10*3/uL (ref 0.0–0.5)
Eosinophils Relative: 0 %
HCT: 34.1 % — ABNORMAL LOW (ref 39.0–52.0)
Hemoglobin: 10.3 g/dL — ABNORMAL LOW (ref 13.0–17.0)
Lymphocytes Relative: 5 %
Lymphs Abs: 2.7 10*3/uL (ref 0.7–4.0)
MCH: 26.4 pg (ref 26.0–34.0)
MCHC: 30.2 g/dL (ref 30.0–36.0)
MCV: 87.4 fL (ref 80.0–100.0)
Metamyelocytes Relative: 4 %
Monocytes Absolute: 2.2 10*3/uL — ABNORMAL HIGH (ref 0.1–1.0)
Monocytes Relative: 4 %
Myelocytes: 3 %
Neutro Abs: 31.9 10*3/uL — ABNORMAL HIGH (ref 1.7–7.7)
Neutrophils Relative %: 55 %
Platelets: 162 10*3/uL (ref 150–400)
RBC: 3.9 MIL/uL — ABNORMAL LOW (ref 4.22–5.81)
RDW: 23.1 % — ABNORMAL HIGH (ref 11.5–15.5)
WBC: 54 10*3/uL (ref 4.0–10.5)
nRBC: 2.9 % — ABNORMAL HIGH (ref 0.0–0.2)
nRBC: 4 /100 WBC — ABNORMAL HIGH

## 2020-03-19 LAB — TYPE AND SCREEN
ABO/RH(D): O POS
Antibody Screen: NEGATIVE

## 2020-03-19 LAB — URIC ACID: Uric Acid, Serum: 7.5 mg/dL (ref 3.7–8.6)

## 2020-03-19 LAB — PATHOLOGIST SMEAR REVIEW

## 2020-03-19 NOTE — Telephone Encounter (Signed)
Received T/C from [facility] in the lab with a critical WBC of 54,000. Read back result. Notified Dr Mike Gip CMA of result.

## 2020-03-19 NOTE — Telephone Encounter (Signed)
Critial labs called for this patient from the lab 54,000 MD made aware.

## 2020-03-20 MED ORDER — GENTAMICIN 0.1 % TOPICAL OINTMENT
0 days
Start: 2020-03-20 — End: ?

## 2020-03-20 NOTE — Unmapped (Signed)
Called and spoke to patient's wife Howard Villarreal to check in on patient.  She let me know that they have not been able to go to the beach as they originally discussed with [place] because of how many things have transpired medically since they saw [place].    -6/23: Appt w/ Dr. Merlene Pulling  -6/28: Lab draw, CT chest (report [facility])  -6/30: Appt w/ Pulmonologist (to f/u on the results of CT scan)  -7/6: Appt w/ Dr. Thomes Dinning has also been to the ENT to cauterize a nosebleed (he has always suffered from these and they have difficulty stopping them historically without cauterization).    Howard Villarreal explains that she understands a beach trip may not be in their immediate future, but they are taking it day by day.  They are not quite ready to move forward with scheduling anything because they do not feel certain of their timeline quite yet, but we agreed to talk next week (if I have not heard from her or Bill by end of next week via MyChart I will give them a call to check in).

## 2020-03-21 ENCOUNTER — Other Ambulatory Visit: Payer: Self-pay

## 2020-03-21 ENCOUNTER — Encounter: Payer: Self-pay | Admitting: Pulmonary Disease

## 2020-03-21 ENCOUNTER — Telehealth: Payer: Self-pay

## 2020-03-21 ENCOUNTER — Ambulatory Visit (INDEPENDENT_AMBULATORY_CARE_PROVIDER_SITE_OTHER): Payer: Medicare Other | Admitting: Pulmonary Disease

## 2020-03-21 ENCOUNTER — Other Ambulatory Visit: Payer: Self-pay | Admitting: Hematology and Oncology

## 2020-03-21 VITALS — BP 127/76 | HR 84 | Temp 97.8°F | Ht 72.0 in | Wt 170.0 lb

## 2020-03-21 DIAGNOSIS — R918 Other nonspecific abnormal finding of lung field: Secondary | ICD-10-CM | POA: Diagnosis not present

## 2020-03-21 DIAGNOSIS — R040 Epistaxis: Secondary | ICD-10-CM

## 2020-03-21 DIAGNOSIS — C9200 Acute myeloblastic leukemia, not having achieved remission: Secondary | ICD-10-CM

## 2020-03-21 DIAGNOSIS — R9389 Abnormal findings on diagnostic imaging of other specified body structures: Secondary | ICD-10-CM

## 2020-03-21 NOTE — Patient Instructions (Signed)
What we discussed today:   We are going to perform a bronchoscopy with navigation (lung GPS) to evaluate the areas in the lung that appear abnormal to determine if this is due to AML, another infection or what we call organizing pneumonia which is an inflammatory process.  Tentatively this is scheduled for 9 July at 1 PM you will get instructions prior to the procedure.  He will need a Covid test prior to the procedure however we will try to see if this can be done other than a nasal swab.  Would prefer no hydroxyurea for at least a day or 2 prior to the procedure.  We will see you in follow-up in 3 to 4 weeks.

## 2020-03-21 NOTE — Telephone Encounter (Signed)
Pt has been scheduled for bronch with nav and cellvizio on 03/30/2020 at 1:00. Dx: pulmonary infiltrate. [id number], [phone]

## 2020-03-21 NOTE — Progress Notes (Signed)
Subjective:    Patient ID: Ruben [name], male    DOB: 1946/07/15, 74 y.o.   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  For approximately 2 to 3 months he developed a cough that would come and go.  Cough for the most part nonproductive.  He also has noted dyspnea on exertion when walking any significant amount of time.  He describes the cough as "raspy" but no sputum production is noted.  No hemoptysis.  He has not noted any fevers, chills or sweats.  He has noted malaise and general fatigue.  He also notices that when he talks a lot the cough will occur.  The patient has had several rounds of antibiotics without effect on this symptom.  He had a chest x-ray that showed bilateral perihilar airspace disease right greater than left that was concerning for infection on 29 February 2020.  He was treated with antibiotics at that time.  Because of persistence of his symptoms after antibiotics a chest CT was performed on 28 June that shows groundglass and consolidative opacities of the lung bilaterally right greater than left.  There are also scattered groundglass opacities noted throughout both lungs.  He has massive splenomegaly.  Patient has had significant fatigue particularly over the last 2 to 3 months.  Here recently he has had some issues with epistaxis that he attributes to increase in dose of hydroxyurea.  I have reviewed the patient's films independently and have reviewed them with the patient and his wife present today at the visit.  Review of Systems A 10 point review of systems was performed and it is as noted above otherwise negative.  Past Medical History:  Diagnosis Date  . Blood dyscrasia   . BPH (benign prostatic hyperplasia)   . Cancer ([place])     SKIN/ POLYCYTHEMIA VERA  . Chronic kidney disease    RENAL INSUFF (40%)  . Crohn disease ([place])   . Crohn's disease ([place])   . Glaucoma   . Glaucoma   . Gout   . History of chicken pox   . Myelofibrosis ([place])   . Nosebleed   . RBBB   . Right bundle branch block    Past Surgical History:  Procedure Laterality Date  . CATARACT EXTRACTION W/PHACO Left 07/13/2018   Procedure: CATARACT EXTRACTION PHACO AND INTRAOCULAR LENS PLACEMENT (IOC);  Surgeon: [name], MD;  Location: [facility] ORS;  Service: Ophthalmology;  Laterality: Left;  [place] 00:39  CDE 5.14 Fluid pack lot # [id number]  . CATARACT EXTRACTION W/PHACO Right 08/03/2018   Procedure: CATARACT EXTRACTION PHACO AND INTRAOCULAR LENS PLACEMENT (IOC);  Surgeon: [name], MD;  Location: [facility] ORS;  Service: Ophthalmology;  Laterality: Right;  [place] 00:43.4 CDE 5.23 Fluid pack Lot # [id number]  . COLONOSCOPY WITH PROPOFOL N/A 03/27/2017   Procedure: COLONOSCOPY WITH PROPOFOL;  Surgeon: [name], MD;  Location: [facility] ENDOSCOPY;  Service: Endoscopy;  Laterality: N/A;  . MOHS SURGERY     EAR  . PROSTATE SURGERY  2008   Prostate Biopsy in [place] to elevated PSA.  per patient normal  . Skin Lesion Basal cell removed    . wart removal     from eyelid   Family History  Problem Relation Age of Onset  . Cancer Mother  Liver  . Cancer Father        Prostate  . Cancer Sister        Hodgkins lymphoma  . Cancer Paternal Aunt        Breast   Social History   Tobacco Use  . Smoking status: Former Smoker    Packs/day: 0.25    Years: 20.00    Pack years: 5.00    Types: Cigarettes    Quit date: 09/28/2003    Years since quitting: 16.4  . Smokeless tobacco: Never Used  Substance Use Topics  . Alcohol use: Yes    Alcohol/week: 4.0 - 5.0 standard drinks    Types: 4 - 5 Glasses of wine per week   Military history limited to reserves.  6 years.  [employer].  No combat.  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    No Known Allergies Current Meds  Medication Sig  . acetaminophen (TYLENOL) 500 MG tablet Take 500 mg by mouth daily as needed for moderate pain.  Marland Kitchen albuterol (VENTOLIN HFA) 108 (90 Base) MCG/ACT inhaler Inhale 2 puffs into the lungs every 6 (six) hours as needed for wheezing or shortness of breath.  . allopurinol (ZYLOPRIM) 300 MG tablet TAKE 1/2 TABLET (150 MG TOTAL) BY MOUTH DAILY.  Marland Kitchen Cholecalciferol (VITAMIN D3) 1000 units CAPS Take 1,000 Units by mouth daily at 12 noon.   . clobetasol ointment (TEMOVATE) 0.05 %   . Cyanocobalamin (RA VITAMIN B-12 TR) 1000 MCG TBCR Take 1,000 mcg by mouth daily at 12 noon.   . Fedratinib HCl (INREBIC) 100 MG CAPS Take 200 mg by mouth daily.   . finasteride (PROSCAR) 5 MG tablet Take 1 tablet (5 mg total) by mouth every evening.  . folic acid (FOLVITE) 1 MG tablet Take 1 mg by mouth daily at 12 noon.   . hydroxyurea (HYDREA) 500 MG capsule Take 1,000 mg by mouth 2 (two) times daily.   Marland Kitchen latanoprost (XALATAN) 0.005 % ophthalmic solution Place 1 drop into both eyes at bedtime.   . Multiple Vitamins-Minerals (CENTRUM SILVER ULTRA MENS) TABS Take 1 tablet by mouth daily at 12 noon.   . Omega-3 Fatty Acids (KP FISH OIL) 1200 MG CAPS Take 1,200 mg by mouth daily.   . sodium bicarbonate 650 MG tablet Take 650 mg by mouth 2 (two) times daily.  . timolol (TIMOPTIC) 0.5 % ophthalmic solution Place 1 drop into both eyes daily. In AM  . venetoclax 100 MG TABS Take by mouth.    Immunization History  Administered Date(s) Administered  . Influenza, High Dose Seasonal PF 06/23/2018, 05/05/2019  . Influenza,inj,Quad PF,6+ Mos 07/06/2017  . Moderna SARS-COVID-2 Vaccination 10/28/2019, 11/29/2019  . Pneumococcal Conjugate-13 04/10/2014  . Pneumococcal Polysaccharide-23 05/03/2012, 02/22/2018  . Td 02/11/2002  . Zoster 07/10/2014  . Zoster Recombinat (Shingrix) 08/01/2019       Objective:   Physical Exam BP 127/76   Pulse 84   Temp 97.8 F (36.6 C)   Ht 6'  (1.829 m)   Wt 170 lb (77.1 kg)   SpO2 100%   BMI 23.06 kg/m   GENERAL: Thin well-developed male in no acute distress.  Looks pale he is fully ambulatory. HEAD: Normocephalic, atraumatic.  EYES: Pupils equal, round, reactive to light.  No scleral icterus.  MOUTH: Nose/mouth/throat not examined due to masking requirements for COVID 19.  NECK: Supple. No thyromegaly. Trachea midline. No JVD.  No adenopathy. PULMONARY: Good air entry bilaterally.  Lungs clear to auscultation bilaterally.  CARDIOVASCULAR: S1 and S2. Regular rate and rhythm.  No rubs murmurs or gallops heard. GASTROINTESTINAL: Abdomen nondistended. MUSCULOSKELETAL: No joint deformity, no clubbing, no edema.  NEUROLOGIC: Awake, alert, no focal deficit noted.  Speech is fluent.  No gait disturbance noted on ambulation. SKIN: Intact,warm,dry.  Pale skin.  Petechiae noted upper extremity. PSYCH: Depressed mood, behavior normal.  Representative CT image:        Assessment & Plan:     ICD-10-CM   1. Bilateral pulmonary infiltrates on chest x-ray  R91.8    The patient will need bronchoscopy for evaluation of these abnormalities Differential diagnosis infection, leukemic infiltration of the lungs or BOOP/COP  2. Acute myeloid leukemia not having achieved remission (HCC)  C92.00    This issue adds complexity to his management  3. Recurrent epistaxis  R04.0    Platelet count 162,000 Check coags On hydroxyurea   Discussion:  Patient has multiple complex comorbidities and has had leukemic transformation of prior myeloproliferative disorder.  He has bilateral pulmonary infiltrates that have a vast differential to include infectious process, leukemic infiltration of the lung and/or potential BOOP/COP.  To assess this accurately the patient will need bronchoscopy for evaluation.  Because of the occasion of the infiltrates it would be best to perform it under navigational bronchoscopy.  Patient has been having some issues with  epistaxis that may be related to his hydroxyurea dosing.  He has not had bleeding elsewhere.  His recent platelet count 162,000.  Will obtain coags prior to the procedure.  He is supposed to have lab draws on 6 July at Dr. Kem Parkinson office and this would be a good time to obtain those.  Benefits, limitations and potential complications of the procedure were discussed with the patient/wife including, but not limited to bleeding, hemoptysis, respiratory failure requiring intubation and/or prolongued mechanical ventilation, infection, pneumothorax (collapse of lung) requiring chest tube placement, stroke from air embolism or even death.  Patient understands that he is at high risk due to his AML.  He also understands the procedure will be done under general anesthesia.  Patient agrees to proceed.  We will try to limit anesthesia time.  Thank you for allowing me to participate in this patient's care.  Discussed with Dr. Mike Gip.  Total time of visit 60 minutes   C. Derrill Kay, MD [facility]   *This note was dictated using voice recognition software/Dragon.  Despite best efforts to proofread, errors can occur which can change the meaning.  Any change was purely unintentional.

## 2020-03-22 ENCOUNTER — Inpatient Hospital Stay: Payer: Medicare Other | Attending: Hematology and Oncology

## 2020-03-22 ENCOUNTER — Inpatient Hospital Stay (HOSPITAL_BASED_OUTPATIENT_CLINIC_OR_DEPARTMENT_OTHER): Payer: Medicare Other | Admitting: Hematology and Oncology

## 2020-03-22 ENCOUNTER — Encounter: Payer: Self-pay | Admitting: Hematology and Oncology

## 2020-03-22 VITALS — BP 132/62 | HR 91 | Temp 97.3°F | Resp 18 | Ht 72.0 in | Wt 162.6 lb

## 2020-03-22 DIAGNOSIS — M109 Gout, unspecified: Secondary | ICD-10-CM | POA: Insufficient documentation

## 2020-03-22 DIAGNOSIS — C9200 Acute myeloblastic leukemia, not having achieved remission: Secondary | ICD-10-CM | POA: Diagnosis not present

## 2020-03-22 DIAGNOSIS — N401 Enlarged prostate with lower urinary tract symptoms: Secondary | ICD-10-CM | POA: Insufficient documentation

## 2020-03-22 DIAGNOSIS — N189 Chronic kidney disease, unspecified: Secondary | ICD-10-CM | POA: Insufficient documentation

## 2020-03-22 DIAGNOSIS — F17210 Nicotine dependence, cigarettes, uncomplicated: Secondary | ICD-10-CM | POA: Insufficient documentation

## 2020-03-22 DIAGNOSIS — Z7189 Other specified counseling: Secondary | ICD-10-CM

## 2020-03-22 DIAGNOSIS — R040 Epistaxis: Secondary | ICD-10-CM | POA: Diagnosis not present

## 2020-03-22 DIAGNOSIS — D649 Anemia, unspecified: Secondary | ICD-10-CM | POA: Diagnosis not present

## 2020-03-22 DIAGNOSIS — J189 Pneumonia, unspecified organism: Secondary | ICD-10-CM | POA: Insufficient documentation

## 2020-03-22 DIAGNOSIS — J90 Pleural effusion, not elsewhere classified: Secondary | ICD-10-CM | POA: Diagnosis not present

## 2020-03-22 DIAGNOSIS — R338 Other retention of urine: Secondary | ICD-10-CM | POA: Diagnosis not present

## 2020-03-22 DIAGNOSIS — I4510 Unspecified right bundle-branch block: Secondary | ICD-10-CM | POA: Diagnosis not present

## 2020-03-22 DIAGNOSIS — Z85828 Personal history of other malignant neoplasm of skin: Secondary | ICD-10-CM | POA: Diagnosis not present

## 2020-03-22 DIAGNOSIS — Z79899 Other long term (current) drug therapy: Secondary | ICD-10-CM | POA: Insufficient documentation

## 2020-03-22 DIAGNOSIS — R6881 Early satiety: Secondary | ICD-10-CM | POA: Diagnosis not present

## 2020-03-22 DIAGNOSIS — K5090 Crohn's disease, unspecified, without complications: Secondary | ICD-10-CM | POA: Diagnosis not present

## 2020-03-22 DIAGNOSIS — R161 Splenomegaly, not elsewhere classified: Secondary | ICD-10-CM | POA: Insufficient documentation

## 2020-03-22 DIAGNOSIS — R9389 Abnormal findings on diagnostic imaging of other specified body structures: Secondary | ICD-10-CM

## 2020-03-22 LAB — CBC WITH DIFFERENTIAL/PLATELET
Abs Immature Granulocytes: 4.8 10*3/uL — ABNORMAL HIGH (ref 0.00–0.07)
Band Neutrophils: 7 %
Basophils Absolute: 0 10*3/uL (ref 0.0–0.1)
Basophils Relative: 0 %
Blasts: 18 %
Eosinophils Absolute: 0 10*3/uL (ref 0.0–0.5)
Eosinophils Relative: 0 %
HCT: 33.4 % — ABNORMAL LOW (ref 39.0–52.0)
Hemoglobin: 10.1 g/dL — ABNORMAL LOW (ref 13.0–17.0)
Lymphocytes Relative: 7 %
Lymphs Abs: 4.2 10*3/uL — ABNORMAL HIGH (ref 0.7–4.0)
MCH: 26.7 pg (ref 26.0–34.0)
MCHC: 30.2 g/dL (ref 30.0–36.0)
MCV: 88.4 fL (ref 80.0–100.0)
Metamyelocytes Relative: 6 %
Monocytes Absolute: 2.4 10*3/uL — ABNORMAL HIGH (ref 0.1–1.0)
Monocytes Relative: 4 %
Myelocytes: 1 %
Neutro Abs: 37.8 10*3/uL — ABNORMAL HIGH (ref 1.7–7.7)
Neutrophils Relative %: 56 %
Platelets: 120 10*3/uL — ABNORMAL LOW (ref 150–400)
Promyelocytes Relative: 1 %
RBC: 3.78 MIL/uL — ABNORMAL LOW (ref 4.22–5.81)
RDW: 25.2 % — ABNORMAL HIGH (ref 11.5–15.5)
Smear Review: DECREASED
WBC Morphology: 28
WBC: 60 10*3/uL (ref 4.0–10.5)
nRBC: 3.5 % — ABNORMAL HIGH (ref 0.0–0.2)

## 2020-03-22 LAB — BASIC METABOLIC PANEL
Anion gap: 7 (ref 5–15)
BUN: 41 mg/dL — ABNORMAL HIGH (ref 8–23)
CO2: 24 mmol/L (ref 22–32)
Calcium: 8.4 mg/dL — ABNORMAL LOW (ref 8.9–10.3)
Chloride: 101 mmol/L (ref 98–111)
Creatinine, Ser: 1.74 mg/dL — ABNORMAL HIGH (ref 0.61–1.24)
GFR calc Af Amer: 44 mL/min — ABNORMAL LOW (ref >60)
GFR calc non Af Amer: 38 mL/min — ABNORMAL LOW (ref >60)
Glucose, Bld: 97 mg/dL (ref 70–99)
Potassium: 4.1 mmol/L (ref 3.5–5.1)
Sodium: 132 mmol/L — ABNORMAL LOW (ref 135–145)

## 2020-03-22 LAB — APTT: aPTT: 40 seconds — ABNORMAL HIGH (ref 24–36)

## 2020-03-22 LAB — PROTIME-INR
INR: 1.2 (ref 0.8–1.2)
Prothrombin Time: 15.1 seconds (ref 11.4–15.2)

## 2020-03-22 LAB — URIC ACID: Uric Acid, Serum: 7 mg/dL (ref 3.7–8.6)

## 2020-03-22 LAB — FIBRINOGEN: Fibrinogen: 421 mg/dL (ref 210–475)

## 2020-03-22 NOTE — Telephone Encounter (Signed)
Spoke to [place] with centralized scheduling and canceled bronch for 03/30/2020. Nothing further is needed at this time.

## 2020-03-22 NOTE — Progress Notes (Signed)
No new c/o

## 2020-03-22 NOTE — Telephone Encounter (Signed)
No PA required with [employer] or [employer] for procedure code [phone] and [phone]. Rhonda J Cobb

## 2020-03-22 NOTE — Telephone Encounter (Signed)
Spoke to pt's spouse, [name] (DPR), Who stated that pt will be admitted [facility]. Pt will have bronch during his admission if oncology feels that this is needed.  Will route to Dr. Patsey Berthold to make her aware of this information.

## 2020-03-22 NOTE — Telephone Encounter (Signed)
Phone pre admit appt is 03/28/20 between 8-1p with covid test 7/8.  Lm to make pt aware.

## 2020-03-22 NOTE — Telephone Encounter (Signed)
ok 

## 2020-03-22 NOTE — Progress Notes (Signed)
[facility]  [address], [address] [place], [place] [address] Phone: [phone]  Fax: [phone]   Clinic Day:  03/22/2020  Referring physician: [name], MD  Chief Complaint: Ruben [name] is a 74 y.o. 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   HPI: The patient was last seen in the hematology clinic on 03/14/2020. At that time, he felt "all right".  He has an occasional cough.  He wanted to take some time off to consider whether he wished to pursue treatment.  CBC on 03/02/2020 revealed a hematocrit of 28.9, hemoglobin 8.9, platelets 101,000, WBC 42,700 with an ANC of 23,500.  Peripheral smear revealed 18% blasts.  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  He was tapering his fedratinib.  Hydroxyurea was 1000 mg a day.  He was set up for a chest CT given several rounds of antibiotics prior to intensive inpatient treatment at [facility].  Chest CT on 03/19/2020 revealed multifocal ground-glass and consolidative opacity in the lungs bilaterally (right > left). There was collapse/consolidative opacity in the right middle lobe with nodular 12 mm opacity identified in the right lower lobe. Imaging features were c/w multifocal pneumonia. There were other scattered tiny solid and sub solid pulmonary nodules in both lungs. There were tiny bilateral pleural effusions. There was massive splenomegaly, markedly increased in the interval since 04/03/2019 abdomen/pelvis CT. It was incompletely visualized today. There was aortic atherosclerosis.  Peripheral smear on 03/19/2020 revealed absolute leukocytosis with increased blasts, estimated at 22.5% by manual differential count. There was normocytic anemia with nucleated RBCs present. There was a low normal platelet count with few giant platelets noted. Close clinical followup was recommended.  The patient saw Dr. Patsey Berthold on 03/21/2020. He reported fatigue and episodes  of epistaxis. Bronchoscopy was scheduled for 03/30/2020. Follow up planned in 3-4 weeks.  Labs on 03/19/2020 revealed hematocrit 34.1, hemoglobin 10.3, MCV 87.4, platelets 162,000, WBC 54,000 ([place] 31,900). Sodium 132. Creatinine 1.87. Albumin 3.1. Uric acid 7.5.  During the interim, he has felt extremely tired. He has also been having nose bleeds, usually in the morning. It is mostly a dripping nose bleed and is never difficult to stop. He has had a couple of nose bleeds in the past. He has not taken any aspirin or Ibuprofen recently. He has a small appetite and reports early satiety.  The patient and his wife have discussed whether they want to pursue treatment and the patient is still going back and forth. He is hesitant because it seems that whenever something gets better, something else pops up. He also does not want his wife to have to drive him back and forth to the hospital everyday.  The patient has an appointment on 04/03/2020 with a lawyer to discuss his will.  The patient would like to proceed with treatment at [facility].   Past Medical History:  Diagnosis Date  . Blood dyscrasia   . BPH (benign prostatic hyperplasia)   . Cancer ([place])    SKIN/ POLYCYTHEMIA VERA  . Chronic kidney disease    RENAL INSUFF (40%)  . Crohn disease ([place])   . Crohn's disease ([place])   . Glaucoma   . Glaucoma   . Gout   . History of chicken pox   . Myelofibrosis ([place])   . Nosebleed   . RBBB   . Right bundle branch block     Past Surgical History:  Procedure Laterality Date  .  CATARACT EXTRACTION W/PHACO Left 07/13/2018   Procedure: CATARACT EXTRACTION PHACO AND INTRAOCULAR LENS PLACEMENT (IOC);  Surgeon: [name], MD;  Location: [facility] ORS;  Service: Ophthalmology;  Laterality: Left;  [place] 00:39  CDE 5.14 Fluid pack lot # [id number]  . CATARACT EXTRACTION W/PHACO Right 08/03/2018   Procedure: CATARACT EXTRACTION PHACO AND INTRAOCULAR LENS PLACEMENT (IOC);  Surgeon: [name], MD;   Location: [facility] ORS;  Service: Ophthalmology;  Laterality: Right;  [place] 00:43.4 CDE 5.23 Fluid pack Lot # [id number]  . COLONOSCOPY WITH PROPOFOL N/A 03/27/2017   Procedure: COLONOSCOPY WITH PROPOFOL;  Surgeon: [name], MD;  Location: [facility] ENDOSCOPY;  Service: Endoscopy;  Laterality: N/A;  . MOHS SURGERY     EAR  . PROSTATE SURGERY  2008   Prostate Biopsy in [place] to elevated PSA.  per patient normal  . Skin Lesion Basal cell removed    . wart removal     from eyelid    Family History  Problem Relation Age of Onset  . Cancer Mother        Liver  . Cancer Father        Prostate  . Cancer Sister        Hodgkins lymphoma  . Cancer Paternal Aunt        Breast    Social History:  reports that he quit smoking about 16 years ago. His smoking use included cigarettes. He has a 5.00 pack-year smoking history. He has never used smokeless tobacco. He reports current alcohol use of about 4.0 - 5.0 standard drinks of alcohol per week. He reports that he does not use drugs. The patientpreviously worked for [employer]. He no longer volunteers for [employer]. The patient's wife isJudith([phone]).He lives in [place]. The patient is accompanied by [name] today.  Allergies: No Known Allergies  Current Medications: Current Outpatient Medications  Medication Sig Dispense Refill  . acetaminophen (TYLENOL) 500 MG tablet Take 500 mg by mouth daily as needed for moderate pain.    Marland Kitchen albuterol (VENTOLIN HFA) 108 (90 Base) MCG/ACT inhaler Inhale 2 puffs into the lungs every 6 (six) hours as needed for wheezing or shortness of breath. 18 g 0  . allopurinol (ZYLOPRIM) 300 MG tablet TAKE 1/2 TABLET (150 MG TOTAL) BY MOUTH DAILY. 45 tablet 1  . Cholecalciferol (VITAMIN D3) 1000 units CAPS Take 1,000 Units by mouth daily at 12 noon.     . clobetasol ointment (TEMOVATE) 0.05 %     . Cyanocobalamin (RA VITAMIN B-12 TR) 1000 MCG TBCR Take 1,000 mcg by mouth daily at 12 noon.     .  Fedratinib HCl (INREBIC) 100 MG CAPS Take 200 mg by mouth daily.     . finasteride (PROSCAR) 5 MG tablet Take 1 tablet (5 mg total) by mouth every evening. 90 tablet 3  . folic acid (FOLVITE) 1 MG tablet Take 1 mg by mouth daily at 12 noon.     Marland Kitchen gentamicin ointment (GARAMYCIN) 0.1 % SMARTSIG:1 Topical Every Night    . hydroxyurea (HYDREA) 500 MG capsule Take 1,000 mg by mouth 2 (two) times daily.     Marland Kitchen latanoprost (XALATAN) 0.005 % ophthalmic solution Place 1 drop into both eyes at bedtime.     . Multiple Vitamins-Minerals (CENTRUM SILVER ULTRA MENS) TABS Take 1 tablet by mouth daily at 12 noon.     . Omega-3 Fatty Acids (KP FISH OIL) 1200 MG CAPS Take 1,200 mg by mouth daily.     . sodium bicarbonate  650 MG tablet Take 650 mg by mouth 2 (two) times daily.    . timolol (TIMOPTIC) 0.5 % ophthalmic solution Place 1 drop into both eyes daily. In AM  5  . venetoclax 100 MG TABS Take by mouth.     . triamcinolone cream (KENALOG) 0.1 % Apply topically 2 (two) times daily as needed. (Patient not taking: Reported on 03/22/2020)     No current facility-administered medications for this visit.    Review of Systems  Constitutional: Positive for malaise/fatigue and weight loss (5 lbs). Negative for chills, diaphoresis and fever.       Doing alright.  HENT: Positive for nosebleeds. Negative for congestion, ear discharge, ear pain, hearing loss, sinus pain, sore throat and tinnitus.   Eyes: Negative for blurred vision and double vision.        S/p cataract surgery. Uses reading glasses.  Respiratory: Positive for cough (occasional). Negative for hemoptysis, shortness of breath and wheezing.   Cardiovascular: Negative for chest pain, palpitations and leg swelling.  Gastrointestinal: Negative for abdominal pain, constipation, diarrhea, heartburn, nausea and vomiting.       Crohn's (no symptoms). Small appetite. Early satiety.  Genitourinary: Negative.  Negative for dysuria, frequency and urgency.    Musculoskeletal: Negative for back pain, falls and myalgias.  Skin: Negative.  Negative for itching and rash.  Neurological: Negative for dizziness, tremors, sensory change, speech change, weakness and headaches.  Endo/Heme/Allergies: Positive for environmental allergies (seasonal). Does not bruise/bleed easily.  Psychiatric/Behavioral: Positive for memory loss (mild). Negative for depression. The patient is not nervous/anxious.        Undecided about pursuing treatment.  All other systems reviewed and are negative.  Performance status (ECOG): 2  Vitals Blood pressure 132/62, pulse 91, temperature (!) 97.3 F (36.3 C), temperature source Tympanic, resp. rate 18, height 6' (1.829 m), weight 162 lb 9.4 oz (73.8 kg), SpO2 100 %.   Physical Exam Vitals and nursing note reviewed.  Constitutional:      General: He is not in acute distress.    Appearance: Normal appearance. He is not diaphoretic.     Interventions: Face mask in place.     Comments: Chronically fatigued appearing.  HENT:     Head: Normocephalic and atraumatic.     Mouth/Throat:     Mouth: Mucous membranes are moist. No oral lesions.     Pharynx: Oropharynx is clear. No oropharyngeal exudate.  Eyes:     General: No scleral icterus.       Right eye: No discharge.        Left eye: No discharge.     Extraocular Movements: Extraocular movements intact.     Conjunctiva/sclera: Conjunctivae normal.     Pupils: Pupils are equal, round, and reactive to light.     Comments: Glasses.  Blue eyes.   Neck:     Vascular: No JVD.  Cardiovascular:     Rate and Rhythm: Normal rate and regular rhythm.     Pulses: Normal pulses.     Heart sounds: Normal heart sounds. No murmur heard.  No friction rub. No gallop.   Pulmonary:     Effort: Pulmonary effort is normal. No respiratory distress.     Breath sounds: Normal breath sounds. No wheezing, rhonchi or rales.  Chest:     Chest wall: No tenderness.  Abdominal:     General: Bowel  sounds are normal. There is no distension.     Palpations: Abdomen is soft. There is splenomegaly (spleen 1  FB above umbilicus.). There is no hepatomegaly or mass.     Tenderness: There is no abdominal tenderness. There is no guarding or rebound.  Musculoskeletal:        General: No swelling or tenderness. Normal range of motion.     Cervical back: Normal range of motion and neck supple.  Lymphadenopathy:     Head:     Right side of head: No preauricular, posterior auricular or occipital adenopathy.     Left side of head: No preauricular, posterior auricular or occipital adenopathy.     Cervical: No cervical adenopathy.     Upper Body:     Right upper body: No supraclavicular or axillary adenopathy.     Left upper body: No supraclavicular or axillary adenopathy.     Lower Body: No right inguinal adenopathy. No left inguinal adenopathy.  Skin:    General: Skin is warm and dry.     Coloration: Skin is not pale.     Findings: No bruising, erythema, lesion or rash.  Neurological:     Mental Status: He is alert and oriented to person, place, and time.  Psychiatric:        Behavior: Behavior normal.        Thought Content: Thought content normal.        Judgment: Judgment normal.    Appointment on 03/22/2020  Component Date Value Ref Range Status  . Fibrinogen 03/22/2020 421  210 - 475 mg/dL Final   Performed at [facility], [address] [place], [place] [address]  . aPTT 03/22/2020 40* 24 - 36 seconds Final   Comment:        IF BASELINE aPTT IS ELEVATED, SUGGEST PATIENT RISK ASSESSMENT BE USED TO DETERMINE APPROPRIATE ANTICOAGULANT THERAPY. Performed at [facility], [address], [place], [place] [address]   . Prothrombin Time 03/22/2020 15.1  11.4 - 15.2 seconds Final  . INR 03/22/2020 1.2  0.8 - 1.2 Final   Comment: (NOTE) INR goal varies based on device and disease states. Performed at [facility], [address] [place], [place]  [address]   . Uric Acid, Serum 03/22/2020 7.0  3.7 - 8.6 mg/dL Final   Performed at [facility], [address], [place],  [address]  . Sodium 03/22/2020 132* 135 - 145 mmol/L Final  . Potassium 03/22/2020 4.1  3.5 - 5.1 mmol/L Final  . Chloride 03/22/2020 101  98 - 111 mmol/L Final  . CO2 03/22/2020 24  22 - 32 mmol/L Final  . Glucose, Bld 03/22/2020 97  70 - 99 mg/dL Final   Glucose reference range applies only to samples taken after fasting for at least 8 hours.  . BUN 03/22/2020 41* 8 - 23 mg/dL Final  . Creatinine, Ser 03/22/2020 1.74* 0.61 - 1.24 mg/dL Final  . Calcium 03/22/2020 8.4* 8.9 - 10.3 mg/dL Final  . GFR calc non Af Amer 03/22/2020 38* >60 mL/min Final  . GFR calc Af Amer 03/22/2020 44* >60 mL/min Final  . Anion gap 03/22/2020 7  5 - 15 Final   Performed at [facility], [address], [place],  [address]  . WBC 03/22/2020 60.0* 4.0 - 10.5 K/uL Final   This critical result has verified and been called to [facility] by Randalyn Rhea on 07 01 2021 at 1031, and has been read back.   Marland Kitchen RBC 03/22/2020 3.78* 4.22 - 5.81 MIL/uL Final  . Hemoglobin 03/22/2020 10.1* 13.0 - 17.0  g/dL Final  . HCT 03/22/2020 33.4* 39 - 52 % Final  . MCV 03/22/2020 88.4  80.0 - 100.0 fL Final  . MCH 03/22/2020 26.7  26.0 - 34.0 pg Final  . MCHC 03/22/2020 30.2  30.0 - 36.0 g/dL Final  . RDW 03/22/2020 25.2* 11.5 - 15.5 % Final  . Platelets 03/22/2020 120* 150 - 400 K/uL Final   Comment: Immature Platelet Fraction may be clinically indicated, consider ordering this additional test [id number]   . nRBC 03/22/2020 3.5* 0.0 - 0.2 % Final  . Neutrophils Relative % 03/22/2020 56  % Final  . Neutro Abs 03/22/2020 37.8* 1.7 - 7.7 K/uL Final  . Band Neutrophils 03/22/2020 7  % Final  . Lymphocytes Relative 03/22/2020 7  % Final  . Lymphs Abs 03/22/2020 4.2* 0.7 - 4.0 K/uL Final  . Monocytes Relative 03/22/2020 4  % Final  . Monocytes Absolute 03/22/2020 2.4* 0  - 1 K/uL Final  . Eosinophils Relative 03/22/2020 0  % Final  . Eosinophils Absolute 03/22/2020 0.0  0 - 0 K/uL Final  . Basophils Relative 03/22/2020 0  % Final  . Basophils Absolute 03/22/2020 0.0  0 - 0 K/uL Final  . WBC Morphology 03/22/2020 28   Final   Comment: SEE PATHO REVIEW FROM 6 2021   . RBC Morphology 03/22/2020 POLYCHROMASIA, MIXED RBC POPULATION   Final  . Smear Review 03/22/2020 PLATELETS APPEAR DECREASED   Final   PLATELETS APPEAR VARIED IN SIZE  . Metamyelocytes Relative 03/22/2020 6  % Final  . Myelocytes 03/22/2020 1  % Final  . Promyelocytes Relative 03/22/2020 1  % Final  . Blasts 03/22/2020 18  % Final  . Abs Immature Granulocytes 03/22/2020 4.80* 0.00 - 0.07 K/uL Final   Performed at [facility], [address], [place], [place] [address]    Assessment:  JASER FULLEN is a 74 y.o. 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 He has had polycythemia dating back to 2013. Hematocrit was 62.1 with a hemoglobin of 19.8 on 03/28/2015. 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 Erythropoietin level was 1.1 (low). 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  He began a phlebotomyprogram on 03/28/2015 to maintain a hematocrit goal of <45.Last phlebotomywason 05/02/2015. He is on a baby aspirin.  CBC on 11/28/2015 revealed a hematocrit of 39.3, hemoglobin 11.9, platelets 463,000, WBC 32,000 with an ANC of 25,000. Differential included 71% segs, 4% lymphocytes and 17% monocytes. Peripheral smear revealed leukocytosis with predominantly mature neutrophils, increased monocytes and rare blasts (<1%).  Bone marrowon 12/14/2015 revealed a persistent myeloproliferative neoplasm with myelofibrosis and alterations compatible with myelodysplatic progression. Marrow was packed (95-100% cellularity) with pan myelosis, multi-lineage dyspoiesis, and no significant increase in blasts. There was moderate to focally marked  reticulin fibrosis (grade 2-3/3). Storage iron was not identified. Flow cytometry revealed non-specific atypical myeloid findings with no increase in blasts. 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 Cytogenetics were normal (46, XY).  Bone marrowon 04/07/2016 at [facility] revealed a hypercellular marrow (>95%) with persistent involvement by myeloid neoplasm with 5% blasts. There was mild reticulin fibrosis. FISH t(9;22) results were normal. 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 Cytogenetics are pending.  Bone marrowon 08/09/2018 revealed a hypercellular bone marrow (>95%) with persistent involvement by myeloid neoplasm with <1% blasts by manual touch preparation differential. There was marked reticulin fibrosis with focal collagen deposition. Cytogenetics were  normal (46, XY). - Myeloid mutation panel study is pending.  Bone marrowon 07/14/2019 revealed a hypercellular marrow (>95%) with persistent involvement by a myeloid neoplasm with extensive fibrosis and rare (< 5%) CD34 +blast byIHC. Myeloid mutation panel revealed the patient's previously reported variants inIDH2, JAK2, KRAS, NRAS and SRSF2 were again identified. There are no new variants. Cytogenetics are pending.  He was briefly on hydroxyurea500 mg a day (12/05/2015 - 12/19/2015 and 06/25/2016 - 07/03/2016). Platelet count increased to 1.18 million and white count to 54,800 without increased blasts on 06/25/2016. CBC on 07/02/2016 revealed a platelets of 429,000 with a white count 30,600. He began allopurinolon 12/19/2015. He began Jakafi 20 mg BID on 05/28/2016. He has been onJakafi25 mg BID since09/12/2016.  LDHis followed: 466 on 01/02/2016, 596 on 05/23/2016, 663 on 07/30/2016, 522 on 09/24/2016, 638 on 01/05/2017, 625 on 02/25/2017, 1144 on 06/10/2017, 720 on 06/17/2017,  728 on 06/24/2017, 724 on 01/06/2018, 616 on 04/07/2018, 671 on 07/07/2018, 820 on 10/06/2018, 702 on 01/04/2019, 10821 on 04/06/2019, 686 on 06/02/2019, 888 on 08/24/2019, 656 on 11/01/2019, 432 and 12/27/2019, 910 on 01/24/2020, and 934 on 02/22/2020. Triglycerideswere 183 (<150) on 08/20/2016, 180 on 01/05/2017, 312 on 06/17/2017, 165 on 11/25/2017, 113 on 02/17/2018.  He has urinary retention. Renal ultrasoundon 07/28/2016 revealed bilateral hydronephrosis (right >left) and a large postvoid residual (2 liters) suggesting bladder dysfunction or outlet obstruction. He underwent temporary Foley catheter placement. He performs I/O self catheterizations. He is on Flomax and finasteride. PSAwas 2.02 on 07/09/2016.  He is followed by GI (Dr. Vira Agar) for a history of polypsand Crohn's disease.He had an unremarkable colonoscopy in 03/2017.AbdomenandpelvisCTon 04/03/2019 revealed mild wall thickening of portions of a loop of small bowel within the pelvis with intervening areas of mildly dilated small bowel,consistent with active Crohn's disease. No evidence of an abscess or fistula. No other evidence of bowel inflammation.There was splenomegaly(16.7 x 7.7 x 15.6 cm), developed since12/11/2011.There was mild bladder wall thickeningandenlargement of the prostate. Chronic bladder outlet obstructionwas felt to bethe etiology of bladder wall thickening.  He has chronicrenal insufficiency(Cr 1.85; CrCl 36 ml/min). SPEP on 09/23/2016 was negative. Spot urine revealed 17.4% of 27.3 mg/dL of a monoclonal protein. 24 hour urine on 10/16/2016 revealed no monoclonal protein. Renal function transiently decreased in 06/2017 after Bactrim. 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  He is followed by nephrology and urology.  He has a history of epistaxis. Normal studies included: PT, PTT, von Willebrand panel, and platelet function assay (PFA).  He has right foot drop.  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 There was no non-contrast MRI evidence of brain metastasis. There was only mild for age nonspecific cerebral white signal changes. Lumbar spine MRIon 06/09/2019 showed diffusely abnormal marrow signal in the visible skeleton, as seen on the brain MRI. There was no pathologic fracture and no extraosseous extension of tumor on the lumbar spine or visible sacrum. In conjunction with the splenomegaly by CT in [place] infiltration byleukemia/lymphoma may be the most likely etiology. Differential considerations included multiple myeloma and metastatic disease unknown primary. The involvement of the pelvis should be amenable to bone marrow biopsy.There was superimposed mild for age lumbar spine degeneration with no spinal stenosis.There was mild degenerative L4 and L5 neural foraminal stenosis.  He was diagnosed with bilateral pneumonia on 02/29/2020.  He is on Augmentin and azithromycin.    He received bothCOVID-19vaccinesin 10/2019 (first on 10/28/2019).  COVID-19 testing was negative on 02/29/2020.  Symptomatically, he is fatigued.  He  has recurrent epistaxis.  Plan: 1.   Labs today:  CBC with diff, BMP, uric acid, PT, PTT, fibrinogen, aspergillus antigen, fungitell, fungal antibodies. 2. 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 Hematocrit 33.4. Hemoglobin 10.1. Platelets120,000. WBC60,000 ([id number]).   Peripheral smear revealed 18% blasts. Discuss patient's thoughts about treatment.   After some discussion patient has decided to pursue decitabine and venetoclax.   Discuss conversation with Dr Evelene Croon.  Discuss plan for admission to the hospital.  Contact Dr Evelene Croon regarding patient's plans. 3. Pneumonia  Review interval chest CT.  Review consult with Dr Patsey Berthold.  Fungal serologies pending.  As patient  will be admitted to [facility] early next week, anticipate bronchoscopy in the hospital. 4.Epistaxis  Patient has a history of recurrent epistaxis.  Discuss concern for possible sinus infection.  PT, PTT, and fibrinogen are normal.  Discuss supportive measures including packing and possible cautery by ENT. 5.   Contact Dr Adriana Simas- done. 6.   RTC after patient's admission to [facility].  I discussed the assessment and treatment plan with the patient.  The patient was provided an opportunity to ask questions and all were answered.  The patient agreed with the plan and demonstrated an understanding of the instructions.  The patient was advised to call back if the symptoms worsen or if the condition fails to improve as anticipated.  I provided 26 minutes of face-to-face time during this this encounter and > 50% was spent counseling as documented under my assessment and plan. An additional 5-8 minutes were spent reviewing his chart (Epic and Care Everywhere) including notes, labs, imaging studies and contacting Dr Evelene Croon.     Ruben Asal, MD, PhD    03/22/2020, 1:53 PM  I, Mirian Mo Tufford, am acting as Education administrator for [employer]. Mike Gip, MD, PhD.  I, Erilyn Pearman C. Mike Gip, MD, have reviewed the above documentation for accuracy and completeness, and I agree with the above.

## 2020-03-23 ENCOUNTER — Ambulatory Visit: Payer: Medicare Other

## 2020-03-23 NOTE — Unmapped (Signed)
Hi,     Howard Villarreal contacted the Communication Center regarding the following:    - Requesting a call back from care team for instructions on the plan for admission on 03/26/20.     Please contact [name] at [phone].    Thanks in advance,    [name]  [facility]   [phone]

## 2020-03-23 NOTE — Unmapped (Signed)
Hello,    Please scheduled patient Howard Villarreal on 03/26/2020 for a SCHED ADM THROUGH INF at  09:00 AM     Reason for Admission: Scheduled chemotherapy and evaluation of brown glass opacities on CT, concerning for fungal pneumonia  ?? Primary Diagnosis:  Encounter for antineoplastic chemotherapy for AML  ?? Primary Diagnosis ICD-10 Code:  Z51.11 & C92.00  ?? Expected length of stay: 30 Days   ?? CPT Code:  16109   ?? CPT Code Description: Under Injection and Intravenous Infusion Chemotherapy and Other Highly Complex Drug or Highly Complex Biologic Agent Administration   ?? Treating Attending: Porfirio Oar, MD  ?? Need for PICC placement in Infusion? No    Infusion Scheduling: Please notify the patient of the appointment date and time once scheduled. Please document this information within this encounter and then route to the navigator prior to closing the encounter.       Scheduled Admissions Assessment      Assessment  Response  Intervention    Type of insurance  Medicare Financial counselor referral is not warranted this time.     Reliable Trasportation  Yes Referral for Social Work is not warranted this time.   Central Line Access  No Central line access not required     Other Pertinent Information:  N/A    Provider:  1. Please place order for PICC no   Order name: Insert PICC line by Vascular Access (must have the house for  outpatient designation)  2.Please place order for [facility] plan for infusion center     Nurse:   Route this smart phrase to Provider (which signals need to place and sign chemotherapy orders, and to place order for PICC line (if applicable) and line care plan in the infusion center on day of admission)    Thank you,  Levy Sjogren

## 2020-03-23 NOTE — Unmapped (Signed)
Attempted to call the patient back regarding admission on Monday.  Left a voice message letting Howard Villarreal know that admission is planned for Monday 7/5 and he would be admitted from the infusion center.  Requested a call back if he has any further questions or concerns.

## 2020-03-23 NOTE — Unmapped (Signed)
Spoke to Dr. Nelva Nay at [facility] regarding Howard Villarreal.      Recall, the patient has MPN-AML and wished to delay induction to go to the beach for 2 weeks. Unfortunately, the patient is now having epistaxis (without evidence of DIC or thrombocytopenia) and also has GGOs on chest CT -- concerning for fungal infection among others and persistent despite a course of abx.  As well, WBC is not decreasing despite hydroxyurea but the platelet count is starting to decrease.      Taken together, I have advised the patient to be admitted to the Malignant Hematology service for additional work-up including consideration of bronchoscopy.  Patient understands the situation is problematic.      Plan:  1. Admit to Malignant Hematology service for expedited evaluation and management of non-resolving pneumonia with ground glass opacities concerning for fungal infection.  Please consult ICID.   2. Continue hydroxyurea in an attempt to get WBC <25 with plan to initiate azacitidine + venetoclax if still feasible.  Patient may require leukapheresis.      Howard Nunnery, MD  Hematology

## 2020-03-24 LAB — ASPERGILLUS ANTIGEN, BAL/SERUM: Aspergillus Ag, BAL/Serum: 0.05 Index (ref 0.00–0.49)

## 2020-03-27 ENCOUNTER — Inpatient Hospital Stay: Payer: Medicare Other | Admitting: Hematology and Oncology

## 2020-03-27 ENCOUNTER — Inpatient Hospital Stay: Payer: Medicare Other

## 2020-03-27 NOTE — Unmapped (Signed)
Contacted Thang Flett to discuss pt status re: admission. Per Dr. Anise Salvo, pt should come in ASAP for workup. Per Mrs. Lenhoff, strong preference to avoid ED, pt okay yesterday and today after feeling less well previously, is eating and low activity but no acute distress today. NN advised re: inpatient admission, visitation, parking, etc. NN will reach out to Infusion NP team for admission through Infusion today and follow up as indicated. Contact information provided. Mrs. Brazil verbalized understanding and expressed appreciation for the call.

## 2020-03-27 NOTE — Unmapped (Signed)
Contacted Mrs. Tersigni to advise that per Dr. Anise Salvo, okay to come in tomorrow if pt stable. Advised per Montel Culver NP that Infusion will reach out to schedule admission for Wednesday 7/7 a.m. Reviewed hospital resources for pt and wife for upcoming admission. Mrs. Navarro verbalized understanding and expressed appreciation for the call.Marland Kitchen

## 2020-03-28 ENCOUNTER — Ambulatory Visit: Admit: 2020-03-28 | Discharge: 2020-04-04 | Disposition: A | Discharge status: Home / Self Care | Payer: MEDICARE

## 2020-03-28 ENCOUNTER — Other Ambulatory Visit: Admit: 2020-03-28 | Discharge: 2020-04-04 | Disposition: A | Discharge status: Home / Self Care | Payer: MEDICARE

## 2020-03-28 ENCOUNTER — Other Ambulatory Visit: Admission: RE | Admit: 2020-03-28 | Payer: Medicare Other | Source: Ambulatory Visit

## 2020-03-28 DIAGNOSIS — C9200 Acute myeloblastic leukemia, not having achieved remission: Secondary | ICD-10-CM | POA: Diagnosis not present

## 2020-03-28 DIAGNOSIS — R05 Cough: Secondary | ICD-10-CM | POA: Diagnosis not present

## 2020-03-28 DIAGNOSIS — R0609 Other forms of dyspnea: Secondary | ICD-10-CM | POA: Diagnosis not present

## 2020-03-28 DIAGNOSIS — N319 Neuromuscular dysfunction of bladder, unspecified: Secondary | ICD-10-CM | POA: Diagnosis not present

## 2020-03-28 DIAGNOSIS — D471 Chronic myeloproliferative disease: Principal | ICD-10-CM

## 2020-03-28 DIAGNOSIS — D45 Polycythemia vera: Principal | ICD-10-CM

## 2020-03-28 DIAGNOSIS — D508 Other iron deficiency anemias: Principal | ICD-10-CM

## 2020-03-28 LAB — COMPREHENSIVE METABOLIC PANEL
ALBUMIN: 3.4 g/dL — ABNORMAL LOW (ref 3.5–5.0)
ALKALINE PHOSPHATASE: 94 U/L (ref 38–126)
ANION GAP: 6 mmol/L — ABNORMAL LOW (ref 7–15)
AST (SGOT): 58 U/L — ABNORMAL HIGH (ref 19–55)
BILIRUBIN TOTAL: 0.8 mg/dL (ref 0.0–1.2)
BLOOD UREA NITROGEN: 34 mg/dL — ABNORMAL HIGH (ref 7–21)
BUN / CREAT RATIO: 21
CALCIUM: 8.3 mg/dL — ABNORMAL LOW (ref 8.5–10.2)
CHLORIDE: 106 mmol/L (ref 98–107)
CO2: 26 mmol/L (ref 22.0–30.0)
CREATININE: 1.61 mg/dL — ABNORMAL HIGH (ref 0.70–1.30)
EGFR CKD-EPI AA MALE: 48 mL/min/{1.73_m2} — ABNORMAL LOW (ref >=60)
EGFR CKD-EPI NON-AA MALE: 42 mL/min/{1.73_m2} — ABNORMAL LOW (ref >=60)
GLUCOSE RANDOM: 77 mg/dL (ref 70–179)
POTASSIUM: 4.8 mmol/L (ref 3.5–5.0)
SODIUM: 138 mmol/L (ref 135–145)

## 2020-03-28 LAB — CBC W/ AUTO DIFF
HEMOGLOBIN: 10.7 g/dL — ABNORMAL LOW (ref 13.5–17.5)
HEMOGLOBIN: 10.9 g/dL — ABNORMAL LOW (ref 13.5–17.5)
MEAN CORPUSCULAR HEMOGLOBIN CONC: 30.3 g/dL — ABNORMAL LOW (ref 31.0–37.0)
MEAN CORPUSCULAR HEMOGLOBIN: 27.5 pg (ref 26.0–34.0)
MEAN CORPUSCULAR HEMOGLOBIN: 28.1 pg (ref 26.0–34.0)
MEAN CORPUSCULAR VOLUME: 93.1 fL (ref 80.0–100.0)
MEAN CORPUSCULAR VOLUME: 92.7 fL (ref 80.0–100.0)
MEAN PLATELET VOLUME: 10.7 fL — ABNORMAL HIGH (ref 7.0–10.0)
NUCLEATED RED BLOOD CELLS: 2 /100{WBCs} (ref <=4)
PLATELET COUNT: 139 10*9/L — ABNORMAL LOW (ref 150–440)
PLATELET COUNT: 152 10*9/L (ref 150–440)
RED BLOOD CELL COUNT: 3.88 10*12/L — ABNORMAL LOW (ref 4.50–5.90)
RED BLOOD CELL COUNT: 3.86 10*12/L — ABNORMAL LOW (ref 4.50–5.90)
RED CELL DISTRIBUTION WIDTH: 23.3 % — ABNORMAL HIGH (ref 12.0–15.0)
RED CELL DISTRIBUTION WIDTH: 23.8 % — ABNORMAL HIGH (ref 12.0–15.0)
WBC ADJUSTED: 55.7 10*9/L (ref 4.5–11.0)
WBC ADJUSTED: 43.5 10*9/L — ABNORMAL HIGH (ref 4.5–11.0)

## 2020-03-28 LAB — MANUAL DIFFERENTIAL
BASOPHILS - ABS (DIFF): 2.2 10*9/L — ABNORMAL HIGH (ref 0.0–0.1)
BASOPHILS - ABS (DIFF): 0.9 10*9/L — ABNORMAL HIGH (ref 0.0–0.1)
BASOPHILS - REL (DIFF): 4 %
BASOPHILS - REL (DIFF): 2 %
BLASTS - REL (DIFF): 27 % (ref <=0)
BLASTS - REL (DIFF): 28 % (ref <=0)
EOSINOPHILS - ABS (DIFF): 0.6 10*9/L — ABNORMAL HIGH (ref 0.0–0.4)
EOSINOPHILS - ABS (DIFF): 0 10*9/L (ref 0.0–0.4)
EOSINOPHILS - REL (DIFF): 1 %
EOSINOPHILS - REL (DIFF): 0 %
LYMPHOCYTES - ABS (DIFF): 6.1 10*9/L — ABNORMAL HIGH (ref 1.5–5.0)
LYMPHOCYTES - ABS (DIFF): 2.6 10*9/L (ref 1.5–5.0)
LYMPHOCYTES - REL (DIFF): 6 %
MONOCYTES - ABS (DIFF): 1.7 10*9/L — ABNORMAL HIGH (ref 0.2–0.8)
MONOCYTES - ABS (DIFF): 1.3 10*9/L — ABNORMAL HIGH (ref 0.2–0.8)
MONOCYTES - REL (DIFF): 3 %
NEUTROPHILS - ABS (DIFF): 30.1 10*9/L — ABNORMAL HIGH (ref 2.0–7.5)
NEUTROPHILS - ABS (DIFF): 26.5 10*9/L — ABNORMAL HIGH (ref 2.0–7.5)
NEUTROPHILS - REL (DIFF): 61 %

## 2020-03-28 LAB — LACTATE DEHYDROGENASE
Lactate dehydrogenase:CCnc:Pt:Ser/Plas:Qn:Reaction: pyruvate to lactate: 1927 — ABNORMAL HIGH
Lactate dehydrogenase:CCnc:Pt:Ser/Plas:Qn:Reaction: pyruvate to lactate: 1988 — ABNORMAL HIGH

## 2020-03-28 LAB — PROTIME-INR: PROTIME: 14.3 s — ABNORMAL HIGH (ref 10.5–13.5)

## 2020-03-28 LAB — WBC ADJUSTED: Leukocytes:NCnc:Pt:Bld:Qn:: 55.7

## 2020-03-28 LAB — PHOSPHORUS
Phosphate:MCnc:Pt:Ser/Plas:Qn:: 4.5
Phosphate:MCnc:Pt:Ser/Plas:Qn:: 4.8 — ABNORMAL HIGH

## 2020-03-28 LAB — FIBRINOGEN LEVEL: Fibrinogen:MCnc:Pt:PPP:Qn:Coag: 361

## 2020-03-28 LAB — LYMPHOCYTES - REL (DIFF): Lymphocytes/100 leukocytes:NFr:Pt:Bld:Qn:Manual count: 6

## 2020-03-28 LAB — PROTIME: Coagulation tissue factor induced:Time:Pt:PPP:Qn:Coag: 14.3 — ABNORMAL HIGH

## 2020-03-28 LAB — MEAN CORPUSCULAR VOLUME: Erythrocyte mean corpuscular volume:EntVol:Pt:RBC:Qn:Automated count: 92.7

## 2020-03-28 LAB — MAGNESIUM
Magnesium:MCnc:Pt:Ser/Plas:Qn:: 2
Magnesium:MCnc:Pt:Ser/Plas:Qn:: 2.1

## 2020-03-28 LAB — HEPARIN CORRELATION: Lab: 0.2

## 2020-03-28 LAB — URIC ACID: Urate:MCnc:Pt:Ser/Plas:Qn:: 7.2

## 2020-03-28 LAB — BASOPHILIC STIPPLING

## 2020-03-28 LAB — D-DIMER QUANTITATIVE (CW,ML,HL): Lab: 1226 — ABNORMAL HIGH

## 2020-03-28 LAB — EGFR CKD-EPI NON-AA MALE
Glomerular filtration rate/1.73 sq M.predicted.non black:ArVRat:Pt:Ser/Plas/Bld:Qn:Creatinine-based formula (CKD-EPI): 42 — ABNORMAL LOW

## 2020-03-28 MED ADMIN — finasteride (PROSCAR) tablet 5 mg: 5 mg | ORAL | @ 21:00:00

## 2020-03-28 NOTE — Unmapped (Addendum)
[facility] Hematology/Oncology H&P    Attending Physician :  Wynn Banker, MD  Accepting Service  : Oncology/Hematology (MDE)  Reason for Admission: New Acute Leukemia, Fungal PNA    Problem List:   Patient Active Problem List   Diagnosis   ??? Polycythemia vera (CMS-HCC)   ??? MPN (myeloproliferative neoplasm) (CMS-HCC)   ??? Iron deficiency anemia   ??? 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 Initally treated with intermittent phlebotomy, ruxonlitinib and hydrea and most recently Fedratinib which he was stopped. He is followed locally by Dr. Rosalene Billings [facility]). 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 Cytogenetics were normal (46, XY). He underwent another BMBx on 04/07/16 which revealed >95% cellular marrow with 5% blasts by manual aspirate differential, mild reticulin fibrosis, mild dyserythropoiesis with molecular mutations in: JAK2 V617F, IDH2, RUNX1, SRSF2. He had labs done recently at [facility] which were concerning for acute leukemia (AML) based on falling hemoglobin and 26% blasts reportedly seen from peripheral blood. He underwent a BMBx on 02/28/20 which confirmed AML progression with 22% myeloblasts, unfortunately insufficient sample for flow. 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 Peripheral blood smear revealed 17% circulating myeloblasts with WBC 53.2k. He was started on Hydrea 500 mg BID in addition to allopurinol on 03/06/20. He discussed his treatment options with Dr. Anise Salvo with decision to focus on quality of life and pursue less intensive treatment with Aza/Ven. He was seen in clinic on 03/12/20 with WBC now 67k and his Hydrea was increased to 1000 mg BID with goal WBC <10k prior to treatment initiation. He requested his therapy be delay ~2 weeks in order to spend time with family. He present for admission today for c/f fungal PNA and persistent hyperleukocytosis.  His WBC on admission was 55k, will monitor closely for TLS and continue Hydrea with goal WBC <25k.   - Continue Hydrea 2000 mg BID with goal of WBC <25k prior to Azacitadine/Venetoclax initiation when medically ready  - Allopurinol Daily 300mg   - TLS Labs Q12 and CBC q12  - Valtrex ppx    Recent CAP now c/f possible Fungal PNA: He has recently completed a course of Augmentin and Azithromycin with brief resolution of symptoms. He was seen in clinic on 6/15 and reported on-going cough. He underwent a CT Chest on 6/28 which revealed multifocal GGO, R >L as well as associated consolidative opacity in RML with associated collapse as well as scattered tiny pulmonary nodules and bilateral trace pleural effusions. Incidental finding of massive splenomegaly with markedly increased size since prior CT A/P on 04/02/20.   [ ]  ICID and Pulmonary consult for possible Bronchoscopy  - Will need antifungal therapy started  - Repeat Chest CT    Recent Epistaxis: Per chart review patient has history of nosebleeds requiring cauterization. Was recently seen by ENT with cautherization to right nasal passage  - Afrin and  hold pressure for 20 seconds with any reoccurrence  - Consider increasing PLT threshold if difficult to control  - Monitor or re occurance    Hx CKD: Baseline Cr appears to be around 1.5-1.8. Upon admission Cr 1.61   - Close monitoring for TLS as above  - Avoid nephrotoxic agents as able, Renally dose medications as indicated  - Continue Sodium Bicarb    Hx Chron's Disease: Patient denies any abdominal pain, melena or BRBPR. He is not currently on any medical management.  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  My visit included review of labs, meds, vitals, previous treatment, outside imaging, discussion with outpatient provider, patient counseling. Plan to increase hydrea to 2 g bid and get CT chest for possible fungal pneumonia.     Wynn Banker, MD      HPI: Howard Villarreal is an 74 y.o. 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 Patient reports continued shortness of breath on exertion with cough.  Patient complains of early satiety. Patient denies all other symptoms.    Denies headache, changes in vision, sore throat, chest pain, abdominal pain, N/V/D/C, changes in urination, fevers, night sweats or chills.       Review of Systems: A ten point review of systems was performed. Pertinent positives are listed above, all others are negative. A 12 system review of systems was negative except as noted in HPI.    Oncologic History:   Primary Oncologist: Dr. Anise Salvo; Followed locally by Dr. 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         Medical History:  PCP: Malva Limes, MD  Past Medical History:   Diagnosis Date   ??? 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 CKD (chronic kidney disease) stage 3, GFR 30-59 ml/min    ??? Crohn's colitis (CMS-HCC) 1986    No surgeries. Was on 6-MP for awhile; hasn't been on anything 2005.   ??? Glaucoma    ??? 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     Surgical History:  Past Surgical History:   Procedure Laterality Date   ??? MOHS SURGERY  2016    left ear for non-melanoma skin cancer      Social History:  Social History     Socioeconomic History   ??? Marital status: Married     Spouse name: Not on file   ??? Number of children: Not on file   ??? Years of education: Not on file   ??? Highest education level: Not on file   Occupational History   ??? Not  on file   Tobacco Use   ??? Smoking status: Former Smoker   ??? Smokeless tobacco: Never Used   Vaping Use   ??? Vaping Use: Never used   Substance and Sexual Activity   ??? Alcohol use: Yes     Alcohol/week: 4.0 standard drinks     Types: 4 Glasses of wine per week     Comment: occasional    ??? Drug use: Not on file   ??? Sexual activity: Not on file   Other Topics Concern   ??? Not on file   Social History Narrative   ??? Not on file     Social Determinants of Health     Financial Resource Strain:    ??? Difficulty of Paying Living Expenses:    Food Insecurity:    ??? Worried About Programme researcher, broadcasting/film/video in the Last Year:    ??? Barista in the Last Year:    Transportation Needs:    ??? Freight forwarder (Medical):    ??? Lack of Transportation (Non-Medical):    Physical Activity:    ??? Days of Exercise per Week:    ??? Minutes of Exercise per Session:    Stress:    ??? Feeling of Stress :    Social Connections:    ??? Frequency of Communication with Friends and Family:    ??? Frequency of Social Gatherings with Friends and Family:    ??? Attends Religious Services:    ??? Database administrator or Organizations:    ??? Attends Engineer, structural:    ??? Marital Status:       Family History: Unless noted below, no pertinent family history to this hospitalization.  Family History   Problem Relation Age of Onset   ??? Cancer Mother    ??? Cancer Father    ??? Cancer Sister    ??? Cancer Paternal Aunt           Allergies: has No Known Allergies.    Medications:   Meds:  Continuous Infusions:  PRN Meds:.    Objective:   Vitals:   Vitals:    03/28/20 1410   BP: 138/70   Pulse: 80   Resp: 22   Temp: 36.6 ??C (97.9 ??F)   SpO2: 100%         Physical Exam:  General: Resting, in no apparent distress, lying in bed  HEENT:  PERRL. No scleral icterus or conjunctival injection. MMM without ulceration, erythema or exudate. No cervical or axillary lymphadenopathy.   Heart:  RRR. S1, S2. No murmurs, gallops, or rubs.  Lungs:  Breathing is unlabored, and patient is speaking full sentences with ease.  No stridor.  CTAB. No rales, ronchi, or crackles.    Abdomen:  No distention or pain on palpation.  Bowel sounds are present and normoactive x 4.  No palpable hepatomegaly or splenomegaly.  No palpable masses.  Skin:  No rashes, petechiae or purpura.  No areas of skin breakdown. Warm to touch, dry, smooth, and even.  Musculoskeletal:  No grossly-evident joint effusions or deformities.  Range of motion about the shoulder, elbow, hips and knees is grossly normal.  Psychiatric:  Range of affect is appropriate.    Neurologic:  Alert and oriented to person, place, time and situation.  Gait is normal.  No Nystagmus Cerebellar tasks (finger-to-nose, rapid hand movement, heel along shin) are completed with ease and are symmetric. CNII-CNXII grossly intact.  Extremities:  Appear well-perfused. +  1 Edema to lower BLE to feet and ankles  CVAD: PIV - no erythema, nontender; dressing CDI.      Test Results  Recent Labs     03/28/20  0941   WBC 55.7*   NEUTROABS 30.1*   HGB 10.7*   PLT 139*     Recent Labs     03/28/20  0941   NA 138   K 4.8   CL 106   CO2 26.0   BUN 34*   CREATININE 1.61*   MG 2.1   PHOS 4.5   CALCIUM 8.3*       Imaging: Radiology studies were personally reviewed    DVT PPX Indicated: no, ambulation  FEN:  Discharge Plan:  - fluids: no  - electrolytes: Monitor for TLS   - diet: regular     Need for PT: no  Anticipated Discharge: her home    Code Status: Full    Time spent on counseling/coordination of care: 1 Hour 35 Minutes  Total time spent with patient: 1 Hour 15 Minutes      McLean K. Oliva Bustard  Nurse Practitioner  Hematology/Oncology  [facility]    Group pager: [phone]

## 2020-03-28 NOTE — Unmapped (Unsigned)
Converted to inpatient H & P. Pre-visit note deleted. treatment with Aza/Ven. He was seen in clinic on 03/12/20 with WBC now 67k and his Hydrea was increased to 1000 mg BID with goal WBC <10k prior to treatment initiation. He requested his therapy be delay ~2 weeks in order to spend time with family. He present for admission today for c/f fungal PNA and persistent hyperleukocytosis.  His WBC on admission was 55k, will monitor closely for TLS and continue Hydrea with goal WBC <25k.   - Continue Hydrea *** with goal of WBC <25k prior to Azacitadine/Venetoclax initiation when medically ready  - Allopurinol Daily  - TLS Labs ??  - Valtrex ppx    Recent CAP now c/f possible Fungal PNA: He has recently completed a course of Augmentin and Azithromycin with brief resolution of symptoms. He was seen in clinic on 6/15 and reported on-going cough. He underwent a CT Chest on 6/28 which revealed multifocal GGO, R >L as well as associated consolidative opacity in RML with associated collapse as well as scattered tiny pulmonary nodules and bilateral trace pleural effusions. Incidental finding of massive splenomegaly with markedly increased size since prior CT A/P on 04/02/20.   [ ]  ICID and Pulmonary consult for possible Bronchoscopy  - Will need antifungal therapy started    Recent Epistaxis: Per chart review patient has history of nosebleeds requiring cauterization. Was recently seen by ENT with cautherization to ***  - Afrin and hold pressure for 20 seconds with any reoccurrence  - Consider increasing PLT threshold if difficult to control    Hx CKD: Baseline Cr appears to be around 1.5-1.8. Upon admission Cr ***   - Close monitoring for TLS as above  - Avoid nephrotoxic agents as able, Renally dose medications as indicated    Hx Chron's Disease: Patient denies any abdominal pain, melena or BRBPR. He is not currently on any medical management.  - Monitor for symptom development    HPI: Howard Villarreal is an 74 y.o. 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     Review of Systems: A ten point review of systems was performed. Pertinent positives are listed above, all others are negative. {Gen Med [id number]    Oncologic History:   Primary Oncologist: Dr. Anise Salvo; Followed locally by Dr. 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         Medical History:  PCP: Malva Limes, MD  Past Medical History:   Diagnosis Date   ??? 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 CKD (chronic kidney disease) stage 3, GFR 30-59 ml/min    ??? Crohn's colitis (CMS-HCC) 1986    No surgeries. Was on 6-MP for awhile; hasn't been on anything 2005.   ??? Glaucoma    ??? 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     Surgical History:  Past Surgical History:   Procedure Laterality Date   ??? MOHS SURGERY  2016  left ear for non-melanoma skin cancer      Social History:  Social History     Socioeconomic History   ??? Marital status: Married     Spouse name: Not on file   ??? Number of children: Not on file   ??? Years of education: Not on file   ??? Highest education level: Not on file   Occupational History   ??? Not on file   Tobacco Use   ??? Smoking status: Former Smoker   ??? Smokeless tobacco: Never Used   Vaping Use   ??? Vaping Use: Never used   Substance and Sexual Activity   ??? Alcohol use: Yes     Alcohol/week: 4.0 standard drinks     Types: 4 Glasses of wine per week     Comment: occasional    ??? Drug use: Not on file   ??? Sexual activity: Not on file   Other Topics Concern   ??? Not on file   Social History Narrative   ??? Not on file     Social Determinants of Health     Financial Resource Strain:    ??? Difficulty of Paying Living Expenses:    Food Insecurity:    ??? Worried About Programme researcher, broadcasting/film/video in the Last Year:    ??? Barista in the Last Year:    Transportation Needs:    ??? Freight forwarder (Medical):    ??? Lack of Transportation (Non-Medical):    Physical Activity:    ??? Days of Exercise per Week:    ??? Minutes of Exercise per Session:    Stress:    ??? Feeling of Stress :    Social Connections:    ??? Frequency of Communication with Friends and Family:    ??? Frequency of Social Gatherings with Friends and Family:    ??? Attends Religious Services:    ??? Database administrator or Organizations:    ??? Attends Engineer, structural:    ??? Marital Status:       Family History: Unless noted below, no pertinent family history to this hospitalization.  family history is not on file.       Allergies: has No Known Allergies.    Medications:   Meds:  Continuous Infusions:  PRN Meds:.    Objective:   Vitals: @VSRANGES @    Physical Exam:  General: Resting, in no apparent distress, lying in bed  HEENT:  PERRL. No scleral icterus or conjunctival injection. MMM without ulceration, erythema or exudate. No cervical or axillary lymphadenopathy.   Heart:  RRR. S1, S2. No murmurs, gallops, or rubs.  Lungs:  Breathing is unlabored, and patient is speaking full sentences with ease.  No stridor.  CTAB. No rales, ronchi, or crackles.    Abdomen:  No distention or pain on palpation.  Bowel sounds are present and normoactive x 4.  No palpable hepatomegaly or splenomegaly.  No palpable masses.  Skin:  No rashes, petechiae or purpura.  No areas of skin breakdown. Warm to touch, dry, smooth, and even.  Musculoskeletal:  No grossly-evident joint effusions or deformities.  Range of motion about the shoulder, elbow, hips and knees is grossly normal.  Psychiatric:  Range of affect is appropriate.    Neurologic:  Alert and oriented to person, place, time and situation.  Gait is normal.  No Nystagmus Cerebellar tasks (finger-to-nose, rapid hand movement, heel along shin) are completed with ease and are symmetric. CNII-CNXII grossly  intact.  Extremities:  Appear well-perfused. No clubbing, edema, or cyanosis.  CVAD: PIV - no erythema, nontender; dressing CDI.      Test Results  Recent Labs     03/28/20  0941   WBC 55.7*   HGB 10.7*   PLT 139*     No results for input(s): NA, K, CL, CO2, BUN, CREATININE, MG, PHOS, CALCIUM in the last 72 hours.    Imaging: {GCS Imaging findings:[id number]}    DVT PPX Indicated: {yes no:314532}, {plan; dvt prophylaxis:16689}  FEN:  Discharge Plan:  - fluids: {yes no:314532}  - electrolytes: stable ***  - diet: regular ***    Need for PT: {yes no:314532}  Anticipated Discharge: {place; discharge destination:120021}    Code Status: @PATFPLSTATCODE @   @RRCODESTATUS @  ***    Time spent on counseling/coordination of care: {time intervals:31374}  Total time spent with patient: {time intervals:31374}

## 2020-03-29 ENCOUNTER — Other Ambulatory Visit: Admission: RE | Admit: 2020-03-29 | Payer: Medicare Other | Source: Ambulatory Visit

## 2020-03-29 DIAGNOSIS — C9200 Acute myeloblastic leukemia, not having achieved remission: Secondary | ICD-10-CM | POA: Diagnosis not present

## 2020-03-29 DIAGNOSIS — R918 Other nonspecific abnormal finding of lung field: Secondary | ICD-10-CM | POA: Diagnosis not present

## 2020-03-29 DIAGNOSIS — J189 Pneumonia, unspecified organism: Secondary | ICD-10-CM | POA: Diagnosis not present

## 2020-03-29 DIAGNOSIS — D6959 Other secondary thrombocytopenia: Secondary | ICD-10-CM | POA: Diagnosis not present

## 2020-03-29 DIAGNOSIS — N189 Chronic kidney disease, unspecified: Secondary | ICD-10-CM | POA: Diagnosis not present

## 2020-03-29 LAB — ANION GAP: Anion gap 3:SCnc:Pt:Ser/Plas:Qn:: 6 — ABNORMAL LOW

## 2020-03-29 LAB — MANUAL DIFFERENTIAL
BASOPHILS - ABS (DIFF): 1.3 10*9/L — ABNORMAL HIGH (ref 0.0–0.1)
BASOPHILS - ABS (DIFF): 0 10*9/L (ref 0.0–0.1)
BASOPHILS - REL (DIFF): 3 %
BASOPHILS - REL (DIFF): 0 %
BLASTS - REL (DIFF): 36 % (ref <=0)
BLASTS - REL (DIFF): 38 % (ref <=0)
EOSINOPHILS - ABS (DIFF): 0.4 10*9/L (ref 0.0–0.4)
EOSINOPHILS - REL (DIFF): 1 %
EOSINOPHILS - REL (DIFF): 0 %
LYMPHOCYTES - ABS (DIFF): 4.3 10*9/L (ref 1.5–5.0)
LYMPHOCYTES - ABS (DIFF): 2.5 10*9/L (ref 1.5–5.0)
LYMPHOCYTES - REL (DIFF): 7 %
MONOCYTES - ABS (DIFF): 1.7 10*9/L — ABNORMAL HIGH (ref 0.2–0.8)
MONOCYTES - ABS (DIFF): 1.8 10*9/L — ABNORMAL HIGH (ref 0.2–0.8)
MONOCYTES - REL (DIFF): 4 %
MONOCYTES - REL (DIFF): 5 %
NEUTROPHILS - ABS (DIFF): 19.6 10*9/L — ABNORMAL HIGH (ref 2.0–7.5)
NEUTROPHILS - ABS (DIFF): 18.2 10*9/L — ABNORMAL HIGH (ref 2.0–7.5)
NEUTROPHILS - REL (DIFF): 46 %
NEUTROPHILS - REL (DIFF): 50 %

## 2020-03-29 LAB — URIC ACID
Urate:MCnc:Pt:Ser/Plas:Qn:: 7.4
Urate:MCnc:Pt:Ser/Plas:Qn:: 7.8

## 2020-03-29 LAB — EGFR CKD-EPI AA MALE
Glomerular filtration rate/1.73 sq M.predicted.black:ArVRat:Pt:Ser/Plas/Bld:Qn:Creatinine-based formula (CKD-EPI): 53 — ABNORMAL LOW

## 2020-03-29 LAB — COMPREHENSIVE METABOLIC PANEL
ALBUMIN: 3 g/dL — ABNORMAL LOW (ref 3.5–5.0)
ALKALINE PHOSPHATASE: 93 U/L (ref 38–126)
ALT (SGPT): 9 U/L (ref <50)
ANION GAP: 6 mmol/L — ABNORMAL LOW (ref 7–15)
AST (SGOT): 50 U/L (ref 19–55)
BILIRUBIN TOTAL: 0.7 mg/dL (ref 0.0–1.2)
BLOOD UREA NITROGEN: 33 mg/dL — ABNORMAL HIGH (ref 7–21)
BUN / CREAT RATIO: 22
CALCIUM: 8.2 mg/dL — ABNORMAL LOW (ref 8.5–10.2)
CHLORIDE: 106 mmol/L (ref 98–107)
CO2: 22 mmol/L (ref 22.0–30.0)
EGFR CKD-EPI AA MALE: 53 mL/min/{1.73_m2} — ABNORMAL LOW (ref >=60)
EGFR CKD-EPI NON-AA MALE: 46 mL/min/{1.73_m2} — ABNORMAL LOW (ref >=60)
GLUCOSE RANDOM: 79 mg/dL (ref 70–179)
POTASSIUM: 4.2 mmol/L (ref 3.5–5.0)
PROTEIN TOTAL: 7.4 g/dL (ref 6.5–8.3)
SODIUM: 134 mmol/L — ABNORMAL LOW (ref 135–145)

## 2020-03-29 LAB — CBC W/ AUTO DIFF
HEMATOCRIT: 34.9 % — ABNORMAL LOW (ref 41.0–53.0)
HEMATOCRIT: 35.7 % — ABNORMAL LOW (ref 41.0–53.0)
HEMOGLOBIN: 10.2 g/dL — ABNORMAL LOW (ref 13.5–17.5)
HEMOGLOBIN: 11 g/dL — ABNORMAL LOW (ref 13.5–17.5)
MEAN CORPUSCULAR HEMOGLOBIN CONC: 30.8 g/dL — ABNORMAL LOW (ref 31.0–37.0)
MEAN CORPUSCULAR HEMOGLOBIN: 27.3 pg (ref 26.0–34.0)
MEAN CORPUSCULAR HEMOGLOBIN: 28.2 pg (ref 26.0–34.0)
MEAN CORPUSCULAR VOLUME: 93.1 fL (ref 80.0–100.0)
MEAN PLATELET VOLUME: 12.2 fL — ABNORMAL HIGH (ref 7.0–10.0)
NUCLEATED RED BLOOD CELLS: 1 /100{WBCs} (ref <=4)
NUCLEATED RED BLOOD CELLS: 2 /100{WBCs} (ref <=4)
PLATELET COUNT: 132 10*9/L — ABNORMAL LOW (ref 150–440)
RED BLOOD CELL COUNT: 3.75 10*12/L — ABNORMAL LOW (ref 4.50–5.90)
RED BLOOD CELL COUNT: 3.9 10*12/L — ABNORMAL LOW (ref 4.50–5.90)
RED CELL DISTRIBUTION WIDTH: 23.2 % — ABNORMAL HIGH (ref 12.0–15.0)
RED CELL DISTRIBUTION WIDTH: 23.5 % — ABNORMAL HIGH (ref 12.0–15.0)
WBC ADJUSTED: 42.6 10*9/L — ABNORMAL HIGH (ref 4.5–11.0)
WBC ADJUSTED: 36.3 10*9/L — ABNORMAL HIGH (ref 4.5–11.0)

## 2020-03-29 LAB — BASIC METABOLIC PANEL
BLOOD UREA NITROGEN: 33 mg/dL — ABNORMAL HIGH (ref 7–21)
BUN / CREAT RATIO: 22
CALCIUM: 8.2 mg/dL — ABNORMAL LOW (ref 8.5–10.2)
CHLORIDE: 106 mmol/L (ref 98–107)
CO2: 22 mmol/L (ref 22.0–30.0)
CREATININE: 1.5 mg/dL — ABNORMAL HIGH (ref 0.70–1.30)
EGFR CKD-EPI AA MALE: 53 mL/min/{1.73_m2} — ABNORMAL LOW (ref >=60)
EGFR CKD-EPI NON-AA MALE: 46 mL/min/{1.73_m2} — ABNORMAL LOW (ref >=60)
GLUCOSE RANDOM: 79 mg/dL (ref 70–179)
POTASSIUM: 4.2 mmol/L (ref 3.5–5.0)

## 2020-03-29 LAB — MAGNESIUM
Magnesium:MCnc:Pt:Ser/Plas:Qn:: 1.8
Magnesium:MCnc:Pt:Ser/Plas:Qn:: 1.9

## 2020-03-29 LAB — LACTATE DEHYDROGENASE
Lactate dehydrogenase:CCnc:Pt:Ser/Plas:Qn:Reaction: pyruvate to lactate: 1638 — ABNORMAL HIGH
Lactate dehydrogenase:CCnc:Pt:Ser/Plas:Qn:Reaction: pyruvate to lactate: 1740 — ABNORMAL HIGH

## 2020-03-29 LAB — WBC ADJUSTED: Leukocytes:NCnc:Pt:Bld:Qn:: 42.6 — ABNORMAL HIGH

## 2020-03-29 LAB — PHOSPHORUS
Phosphate:MCnc:Pt:Ser/Plas:Qn:: 4.7
Phosphate:MCnc:Pt:Ser/Plas:Qn:: 5 — ABNORMAL HIGH

## 2020-03-29 LAB — EOSINOPHILS - REL (DIFF): Eosinophils/100 leukocytes:NFr:Pt:Bld:Qn:Manual count: 1

## 2020-03-29 LAB — BASOPHILS - REL (DIFF): Basophils/100 leukocytes:NFr:Pt:Bld:Qn:Manual count: 0

## 2020-03-29 LAB — VARIABLE HEMOGLOBIN CONCENTRATION

## 2020-03-29 LAB — FUNGITELL, SERUM: Fungitell Result: <31 pg/mL (ref <80)

## 2020-03-29 MED ADMIN — hydroxyurea (HYDREA) capsule 2,000 mg: 2000 mg | ORAL | @ 01:00:00

## 2020-03-29 MED ADMIN — finasteride (PROSCAR) tablet 5 mg: 5 mg | ORAL | @ 14:00:00

## 2020-03-29 MED ADMIN — timolol (TIMOPTIC) 0.5 % ophthalmic solution 1 drop: 1 [drp] | OPHTHALMIC | @ 14:00:00

## 2020-03-29 MED ADMIN — sodium chloride (NS) 0.9 % flush 10 mL: 10 mL | INTRAVENOUS | @ 14:00:00

## 2020-03-29 MED ADMIN — hydroxyurea (HYDREA) capsule 2,000 mg: 2000 mg | ORAL | @ 22:00:00

## 2020-03-29 MED ADMIN — sodium bicarbonate tablet 650 mg: 650 mg | ORAL | @ 14:00:00

## 2020-03-29 MED ADMIN — sodium bicarbonate tablet 650 mg: 650 mg | ORAL | @ 01:00:00

## 2020-03-29 MED ADMIN — allopurinoL (ZYLOPRIM) tablet 300 mg: 300 mg | ORAL | @ 14:00:00 | Stop: 2020-03-29

## 2020-03-29 NOTE — Unmapped (Signed)
Patient is alert and oriented. VSS, afebrile. No respiratory distress. Denies any pain. Patient do self catheterization. CT chest was done. Ambulating well. Fall precautions are reinforced.  Problem: Adult Inpatient Plan of Care  Goal: Plan of Care Review  Outcome: Progressing  Goal: Patient-Specific Goal (Individualization)  Outcome: Progressing  Goal: Absence of Hospital-Acquired Illness or Injury  Outcome: Progressing  Goal: Optimal Comfort and Wellbeing  Outcome: Progressing  Goal: Readiness for Transition of Care  Outcome: Progressing  Goal: Rounds/Family Conference  Outcome: Progressing

## 2020-03-29 NOTE — Unmapped (Signed)
This patient was not seen in person. The clinical nutrition service has moved to a liaison model to minimize potential spread of COVID-19 and protect patients/providers.  During this time, we will be limiting person-to-person contact when possible.    Adult Nutrition Assessment Note    Visit Type: RN Consult  Reason for Visit: Have you gained or lost 10 pounds in the past 3 months?, Have you had a decrease in food intake or appetite?    ASSESSMENT:   HPI & PMH: Howard Villarreal is an 74 y.o. 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   Past Medical History:   Diagnosis Date   ??? 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 CKD (chronic kidney disease) stage 3, GFR 30-59 ml/min    ??? Crohn's colitis (CMS-HCC) 1986    No surgeries. Was on 6-MP for awhile; hasn't been on anything 2005.   ??? Glaucoma    ??? 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        Nutrition Hx: RD working remotely for infection prevention, spoke with patient via hospital phone:  Pt reports has had early satiety past month. His oral intake usually 80-90% of meals completed. He his weight has declined from 180#'s to 160#'s over several months. He denies any GI symptoms/Chron's Flares in past year.    Nutritionally Pertinent Meds: Na Bicarb BID  Labs:   Lab Results   Component Value Date    NA 134 (L) 03/29/2020    NA 134 (L) 03/29/2020    K 4.2 03/29/2020    K 4.2 03/29/2020    CL 106 03/29/2020    CL 106 03/29/2020    CO2 22.0 03/29/2020    CO2 22.0 03/29/2020    BUN 33 (H) 03/29/2020    BUN 33 (H) 03/29/2020    CREATININE 1.50 (H) 03/29/2020    CREATININE 1.50 (H) 03/29/2020    GLU 79 03/29/2020    GLU 79 03/29/2020    CALCIUM 8.2 (L) 03/29/2020    CALCIUM 8.2 (L) 03/29/2020    ALBUMIN 3.0 (L) 03/29/2020    PHOS 4.7 03/29/2020        Skin:   Patient Lines/Drains/Airways Status    Active Wounds     None               Current nutrition therapy order:        Nutrition Orders   (From admission, onward)             Start     Ordered    03/28/20 1150  Nutrition Therapy Regular/House  Effective now     Question:  Nutrition Therapy:  Answer:  Regular/House    03/28/20 1203                 Anthropometric Data:  -- Height: 182.9 cm (6')   -- Last recorded weight: 73.1 kg (161 lb 1.6 oz)  -- Admission weight: 72.5 kg (159 lb 14.4 oz)  -- IBW: 80.79 kg  -- Percent IBW: 90.45 %  -- BMI: Body mass index is 21.85 kg/m??.   -- Weight changes this admission:   Last 5 Recorded Weights    03/28/20 1000 03/28/20 1410   Weight: 72.5 kg (159 lb 14.4 oz) 73.1 kg (161 lb 1.6 oz)      -- Weight  history PTA: 4.1% loss since 03/06/20; Per Nutrition Hx, 10.5% loss x Several Months  Wt Readings from Last 10 Encounters:   03/28/20 73.1 kg (161 lb 1.6 oz)   03/28/20 72.1 kg (158 lb 15.2 oz)   03/12/20 74.8 kg (164 lb 12.8 oz)   03/06/20 76.2 kg (168 lb 1.6 oz)   07/14/19 76 kg (167 lb 8 oz)   07/14/19 75.9 kg (167 lb 5.3 oz)   07/11/19 75.8 kg (167 lb 3.2 oz)   08/09/18 84.8 kg (187 lb 1 oz)   07/12/18 83.9 kg (184 lb 15.5 oz)   01/04/18 86 kg (189 lb 8 oz)        Daily Estimated Nutrient Needs:   Energy: 2190 kcals [30-35 kcal/kg using last recorded weight, 73 kg (03/29/20 0931)]  Protein: 73-110 gm [1.0-1.2 gm/kg, 1.2-1.5 gm/kg using last recorded weight, 73 kg (03/29/20 0931)]  Carbohydrate:   [ ]   Fluid: 2190 [1 mL/kcal (maintenance)]     Nutrition Focused Physical Exam:                   Nutrition Evaluation  Overall Impressions: Unable to perform Nutrition-Focused Physical Exam at this time due to (comment) (03/29/20 1610)  Nutrition focused physical exam not completed in an effort to minimize contact between patient and provider due to infectious disease outbreak.    Nutrition Designation: Normal weight (BMI 18.50 - 24.99 kg/m2) (03/29/20 0931)     DIAGNOSIS:  Malnutrition Assessment using AND/ASPEN Clinical Characteristics:                            Overall nutrition impression: Pt early satiety impacted by splenomegly. He will need redirection with his meal volumes.     GOALS:  Anthropometric:       - Prevent weight loss >1% per week     RECOMMENDATIONS AND INTERVENTIONS:  Recommend continue current nutrition therapy of regular   ---adjust to 6 small meals daily, oriented on ordering & snacking  Reviewed Oral Supplements Available for PRN usage  Continue daily weights per unit protocol    RD Follow Up Parameters:  1-2 times per 4 week period (and more frequent as indicated)     Ed Blalock, MS, RD, CSO, LDN  Pager # [phone]    Consult Completed Remotely

## 2020-03-29 NOTE — Unmapped (Signed)
Care Management  Initial Transition Planning Assessment              General  Care Manager assessed the patient by : In person interview with patient, Medical record review, Discussion with Clinical Care team  Orientation Level: Oriented X4  Functional level prior to admission: Independent  Reason for referral: Discharge Planning   CM met with patient in pt room.  Pt/visitors were wearing hospital provided masks for the duration of the interaction with CM.   CM was wearing hospital provided surgical mask.  CM was not within 6 foot of the patient/visitors during this interaction.       Contact/Decision Maker  Extended Emergency Contact Information  Primary Emergency Contact: Ehresman,[name]   [place]  Home Phone: [phone]  Relation: Spouse    Legal Next of Kin / Guardian / POA / Advance Directives       Advance Directive (Medical Treatment)  Does patient have an advance directive covering medical treatment?: Patient has advance directive covering medical treatment, copy not in chart. (Pt states his wife [name] is HCPOA)  Advance directive covering medical treatment not in Chart:: Copy requested from family (Pt states his wife is planning on bringing to hospital)    Health Care Decision Maker [HCDM] (Medical & Mental Health Treatment)  Healthcare Decision Maker: HCDM documented in the HCDM/Contact Info section.  Information offered on HCDM, Medical & Mental Health advance directives:: Patient given information.         Patient Information  Lives with: Spouse/significant other    Type of Residence: Private residence       Type of Residence: Mailing Address:  [address]  [place] [address]  Contacts: Accompanied by: Alone  Patient Phone Number:   Telephone Information:   Mobile [phone] - pt's wife             Medical Provider(s): Malva Limes, MD  Reason for Admission: Admitting Diagnosis:  Expedite eval/management for possible fungal infection  Past Medical History:   has a past medical history of Acute myeloid leukemia not having achieved remission (CMS-HCC) (03/06/2020), CKD (chronic kidney disease) stage 3, GFR 30-59 ml/min, Crohn's colitis (CMS-HCC) (1986), Glaucoma, and Polycythemia vera (CMS-HCC) (2013).  Past Surgical History:   has a past surgical history that includes Mohs surgery (2016).   Previous admit date: N/A    Primary Insurance- Payor: MEDICARE / Plan: MEDICARE PART A AND PART B / Product Type: *No Product type* /   Secondary Insurance ??? Secondary Insurance  [employer]  Prescription Coverage ??? yes  Preferred Pharmacy - [id number] - [place], [place] - [address]  [facility]  [facility] - [place], [place] - [address]    Transportation home: Private vehicle      Location/Detail: [address] [facility] [place] [address]    Support Systems/Concerns: Children, Spouse, Family Members    Responsibilities/Dependents at home?: No    Home Care services in place prior to admission?: No        Equipment Currently Used at Home: other (see comments) (self cath supplies)  Current HME Agency (Name/Phone #): [phone]    Currently receiving outpatient dialysis?: No       Financial Information       Need for financial assistance?: No       Social Determinants of Health  Social Determinants of Health     Tobacco Use: Medium Risk   ???  Smoking Tobacco Use: Former Smoker   ??? Smokeless Tobacco Use: Never Used   Alcohol Use:    ??? How often do you have 5 or more drinks on one occasion?:    ??? How many drinks containing alcohol do you have on a typical day when you are drinking?:    ??? How often do you have a drink containing alcohol?:    Financial Resource Strain: Low Risk    ??? Difficulty of Paying Living Expenses: Not hard at all   Food Insecurity: No Food Insecurity   ??? Worried About Programme researcher, broadcasting/film/video in the Last Year: Never true   ??? Ran Out of Food in the Last Year: Never true   Transportation Needs: No Transportation Needs   ??? Lack of Transportation (Medical): No   ??? Lack of Transportation (Non-Medical): No   Physical Activity:    ??? Days of Exercise per Week:    ??? Minutes of Exercise per Session:    Stress:    ??? Feeling of Stress :    Social Connections:    ??? Frequency of Communication with Friends and Family:    ??? Frequency of Social Gatherings with Friends and Family:    ??? Attends Religious Services:    ??? Database administrator or Organizations:    ??? Attends Engineer, structural:    ??? Marital Status:    Intimate Programme researcher, broadcasting/film/video Violence:    ??? Fear of Current or Ex-Partner:    ??? Emotionally Abused:    ??? Physically Abused:    ??? Sexually Abused:    Depression:    ??? PHQ-2 Score:    Housing Stability: Low Risk    ??? Within the past 12 months, have you ever stayed: outside, in a car, in a tent, in an overnight shelter, or temporarily in someone else's home (i.e. couch-surfing)?: No   ??? Are you worried about losing your housing?: No   ??? Within the past 12 months, have you been unable to get utilities (heat, electricity) when it was really needed?: No   Substance Use:    ??? Taken prescription drugs for non-medical reasons:    ??? Taken illegal drugs:    ??? Patient indicated they have taken drugs in the past year, including Cannabis, Cocaine, Prescription stimulants, Methamphetamine, Inahalnts, Sedatives or sleeping pills, Hallucinogens, Street Opioids or Prescription opiods for non-medical reasons:    Health Literacy:    ??? :        Discharge Needs Assessment  Concerns to be Addressed: other (see comments) (TBD)    Clinical Risk Factors: > 65, Principal Diagnosis: Cancer, Stroke, COPD, Heart Failure, AMI, Pneumonia, Joint Replacment    Barriers to taking medications: No    Prior overnight hospital stay or ED visit in last 90 days: No    Readmission Within the Last 30 Days: no previous admission in last 30 days         Anticipated Changes Related to Illness: none    Equipment Needed After Discharge: none    Discharge Facility/Level of Care Needs: other (see comments) (home with self care, monitor for line placement)    Readmission  Risk of Unplanned Readmission Score: UNPLANNED READMISSION SCORE: 15%  Predictive Model Details          15% (Medium)  Factor Value    Calculated 03/29/2020 08:04 21% Number of active Rx orders 24    [facility] Risk of Unplanned Readmission Model 13% Diagnosis of cancer present  11% Latest calcium low (8.2 mg/dL)     82% Latest BUN high (33 mg/dL)     8% Imaging order present in last 6 months     8% Latest hemoglobin low (10.9 g/dL)     7% Phosphorous result present     7% Age 74     6% Diagnosis of deficiency anemia present     5% Latest creatinine high (1.50 mg/dL)     3% Charlson Comorbidity Index 2     1% Current length of stay 0.834 days      Readmitted Within the Last 30 Days? (No if blank)   Patient at risk for readmission?: Yes    Discharge Plan  Screen findings are: Discharge planning needs identified or anticipated (Comment). (TBD, monitor line placement)    Expected Discharge Date: TBD    Quality data for continuing care services shared with patient and/or representative?: N/A  Patient and/or family were provided with choice of facilities / services that are available and appropriate to meet post hospital care needs?: N/A       Initial Assessment complete?: Yes

## 2020-03-29 NOTE — Unmapped (Signed)
Daily Progress Note        Principal Problem:    Acute myeloid leukemia not having achieved remission (CMS-HCC)       LOS: 1 day     Assessment/Plan: Howard Villarreal is an 74 y.o. 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  Plan to initiate treatment  when platelets are below 25K. Q8hr CBC and TLS to monitor response to Hydrea.  Twice Daily allopurinol for uric acid. Repeat Chest Ct showing likely multifocal pneumonia patient s/p course of Azythromycin and Augmentin. ICID Consulted to determine need for bronchoscopy prior to initiating antifungal therapy.  Continue Bicarb tabs CKD, Timolol and Latanoprost Eye drops of glaucoma, and FloMax and self catherterization for Bladder muscle dysfunction.    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 Initally treated with intermittent phlebotomy, ruxonlitinib and hydrea and most recently Fedratinib which he was stopped. He is followed locally by Dr. Rosalene Billings [facility]). 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 Cytogenetics were normal (46, XY). He underwent another BMBx on 04/07/16 which revealed >95% cellular marrow with 5% blasts by manual aspirate differential, mild reticulin fibrosis, mild dyserythropoiesis with molecular mutations in:??JAK2 V617F, IDH2, RUNX1, SRSF2. He had labs done recently at [facility] which were concerning for acute leukemia (AML) based on falling hemoglobin and 26% blasts reportedly seen from peripheral blood. He underwent a BMBx on 02/28/20 which confirmed AML progression with 22% myeloblasts, unfortunately insufficient sample for flow. 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 Peripheral blood smear revealed 17% circulating myeloblasts with WBC 53.2k. He was started on Hydrea 500 mg BID in addition to allopurinol on 03/06/20. He discussed his treatment options with Dr. Anise Salvo with decision to focus on quality of life and pursue less intensive treatment with Aza/Ven. He was seen in clinic on 03/12/20 with WBC now 67k and his Hydrea was increased to 1000 mg BID with goal WBC <10k prior to treatment initiation. He requested his therapy be delay ~2 weeks in order to spend time with family. He present for admission today for c/f fungal PNA and persistent hyperleukocytosis.  His WBC on admission was 55k, will monitor closely for TLS and continue Hydrea with goal WBC <25k.   - Continue Hydrea 2000 mg Q8 with goal of WBC <25k prior to Azacitadine/Venetoclax initiation when medically ready  - Allopurinol Daily 300 mg BID  - TLS Labs Q8 and CBC q8  - Valtrex ppx    Recent CAP now c/f possible Fungal PNA: He has recently completed a course of Augmentin and Azithromycin with brief resolution of symptoms. He was seen in clinic on 6/15 and reported on-going cough. He underwent a CT Chest on 6/28 which revealed multifocal GGO, R >L as well as associated consolidative opacity in RML with associated collapse as well as scattered tiny pulmonary nodules and bilateral trace pleural effusions. Incidental finding of massive splenomegaly with markedly  increased size since prior CT A/P on 04/02/20.   [ ]  ICID and Pulmonary consult for possible Bronchoscopy, awaiting consult note, if no bronchoscopy indicated start Micafungin  - Will need antifungal therapy started  - Repeat Chest CT showing multifocal opacities, likely pneumonia    Recent Epistaxis: Per chart review patient has history of nosebleeds requiring cauterization. Was recently seen by ENT with cautherization to right nasal passage  - Afrin and hold pressure for 20 seconds with any reoccurrence  - Consider increasing PLT threshold if difficult to control  - Monitor or re occurrence  Vaseline for nasal dryness    Hx CKD: Baseline Cr appears to be around 1.5-1.8. Upon admission Cr 1.61   - Close monitoring for TLS as above  - Avoid nephrotoxic agents as able, Renally dose medications as indicated  - Continue Sodium Bicarb  ??  Hx Chron's Disease: Patient denies any abdominal pain, melena or BRBPR. He is not currently on any medical management.  - Monitor for symptom development  - Hold at home multi-vitamins  ??  Bladder Muscle Dysfunction  -I/O Cath per patient's regimen  - Continue FloMax  ??  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   - Transfuse 1 unit of pRBCs for hgb >7  - Transfuse 1 unit plt for plts >10K     Nutrition:                        Subjective:     Interval History: Patient reports feeling generally well.  Patient encouraged by the lowering of hiw white bllod cells.  Patient anxious to initiate chemotherapy.     Denies headache, changes in vision, sore throat, chest pain, abdominal pain, N/V/D/C, changes in urination, fevers, night sweats or chills.       10 point ROS otherwise negative except as above in the HPI.     Objective:     Vital signs in last 24 hours:  Temp:  [36.4 ??C (97.5 ??F)-36.7 ??C (98.1 ??F)] 36.7 ??C (98.1 ??F)  Heart Rate:  [79-88] 87  Resp:  [20-22] 22  BP: (124-141)/(69-76) 124/70  MAP (mmHg):  [85-95] 85  SpO2:  [97 %-100 %] 99 %    Intake/Output last 3 shifts:  I/O last 3 completed shifts:  In: 218 [P.O.:218]  Out: 1800 [Urine:1800]    Meds:  Current Facility-Administered Medications   Medication Dose Route Frequency Provider Last Rate Last Admin   ??? allopurinoL (ZYLOPRIM) tablet 300 mg  300 mg Oral Daily Herbert Moors, AGNP   300 mg at 03/29/20 1610   ??? emollient combination no.92 (LUBRIDERM) lotion 1 application  1 application Topical Q1H PRN Herbert Moors, AGNP       ??? finasteride (PROSCAR) tablet 5 mg  5 mg Oral Daily Herbert Moors, AGNP   5 mg at 03/29/20 0941   ??? hydroxyurea (HYDREA) capsule 2,000 mg  2,000 mg Oral Q8H Herbert Moors, [place]       ??? latanoprost (XALATAN) 0.005 % ophthalmic solution 1 drop  1 drop Both Eyes Nightly Herbert Moors, [place]   1 drop at 03/28/20 2118   ??? loperamide (IMODIUM) capsule 2 mg  2 mg Oral Q2H PRN Herbert Moors, AGNP       ??? loperamide (IMODIUM) capsule 4 mg  4 mg Oral Once PRN  Herbert Moors, AGNP       ??? sodium bicarbonate tablet 650 mg  650 mg Oral BID Herbert Moors, AGNP   650 mg at 03/29/20 0941   ??? sodium chloride (NS) 0.9 % flush 10 mL  10 mL Intravenous BID Herbert Moors, AGNP       ??? sodium chloride (NS) 0.9 % flush 10 mL  10 mL Intravenous BID Herbert Moors, AGNP       ??? sodium chloride (NS) 0.9 % flush 10 mL  10 mL Intravenous BID Herbert Moors, AGNP   10 mL at 03/29/20 0947   ??? sodium chloride (NS) 0.9 % infusion  20 mL/hr Intravenous Continuous Herbert Moors, AGNP 20 mL/hr at 03/28/20 1704 20 mL/hr at 03/28/20 1704   ??? timolol (TIMOPTIC) 0.5 % ophthalmic solution 1 drop  1 drop Both Eyes Daily Herbert Moors, AGNP   1 drop at 03/29/20 0941   ??? valACYclovir (VALTREX) tablet 500 mg  500 mg Oral Daily Herbert Moors, AGNP   500 mg at 03/29/20 0941       Physical Exam:  General: Resting, in no apparent distress, lying in bed  HEENT:  PERRL. No scleral icterus or conjunctival injection. MMM without ulceration, erythema or exudate. No cervical or axillary lymphadenopathy.   Heart:  RRR. S1, S2. No murmurs, gallops, or rubs.  Lungs:  Breathing is unlabored, and patient is speaking full sentences with ease.  No stridor.  CTAB. No rales, ronchi, or crackles.    Abdomen:  No distention or pain on palpation.  Bowel sounds are present and normoactive x 4.  No palpable hepatomegaly. Impressive Splenomegaly  No palpable masses.  Skin:  No rashes, petechiae or purpura.  No areas of skin breakdown. Warm to touch, dry, smooth, and even.  Musculoskeletal:  No grossly-evident joint effusions or deformities.  Range of motion about the shoulder, elbow, hips and knees is grossly normal.  Psychiatric:  Range of affect is appropriate.    Neurologic:  Alert and oriented to person, place, time and situation.  Gait is normal.  No Nystagmus Cerebellar tasks (finger-to-nose, rapid hand movement, heel along shin) are completed with ease and are symmetric. CNII-CNXII grossly intact.  Extremities:  Appear well-perfused. No clubbing, edema, or cyanosis.  CVAD: R CW Port - no erythema, nontender; dressing CDI.      Labs:  Recent Labs     03/28/20  0941 03/28/20  1640 03/29/20  0624 03/29/20  1332   WBC 55.7* 43.5* 42.6* 36.3*   NEUTROABS 30.1* 26.5* 19.6* 18.2*   HGB 10.7* 10.9* 10.2* 11.0*   HCT 36.1* 35.8* 34.9* 35.7*   PLT 139* 152 132* 156   CREATININE 1.61*  --  1.50* - 1.50*  --    BUN 34*  --  33* - 33*  --    BILITOT 0.8  --  0.7  --    AST 58*  --  50  --    ALT 11  --  9  --    ALKPHOS 94  --  93  --    K 4.8  --  4.2 - 4.2  --    MG 2.1 2.0 1.9 1.8   CALCIUM 8.3*  --  8.2* - 8.2*  --    NA 138  --  134* - 134*  --    CL 106  --  106 - 106  --    CO2 26.0  --  22.0 - 22.0  --    PHOS 4.5 4.8* 4.7 5.0*   INR 1.21  --   --   --        Imaging:  Chest Ct without Contrast revealing:Multifocal right upper lobe airspace opacities; likely pneumonia.    Vena Rua. Oliva Bustard  Nurse Practitioner  Hematology/Oncology  [facility]    Group pager: [phone]

## 2020-03-29 NOTE — Unmapped (Signed)
Pharmacy: First Cycle Chemotherapy Patient Education  Howard Villarreal is a 74 y.o. year old with AML who was admitted to the [facility] and will receive their first cycle of chemotherapy. Patient education reviewing the chemotherapy is provided to the patient by their oncology pharmacist.     Chemotherapy regimen/agents: Azacitidine + Venetoclax  Day 1 of chemotherapy: TBA  Central Venous Access Device: not indicated    For each of the following items listed below and where applicable, signs and symptoms of adverse effects as well as self-care management were reviewed. Additionally, chemotherapy scheduling, chemotherapy agents, mechanism of action, exposure risk mitigation for caregivers, drug-drug/food/herbal interactions and supportive care measures were also reviewed with the patient.     Side effects discussed included but were not limited to: neutropenia/febrile neutropenia, thrombocytopenia, anemia and nausea/vomiting.    A/P:  1. The patient verbalized understanding of this information. The patient's drug profile was reviewed for drug interactions with the chemotherapy and no interactions were present at the time of review. The patient was given educational materials that included resources from the [facility] patient education library, EPIC, Product/process development scientist.     Approximate time spent with patient: 15 minutes.    Thank you, please feel free to contact with any questions or concerns.    Oris Drone,  PharmD

## 2020-03-29 NOTE — Unmapped (Signed)
Pt admitted to 4804 from clinic. Pt has remained free of falls, afebrile and VSS throughout shift. A&Ox4. No complaints of pain. Pt arrived with his home medications but wife has taken them all back home. Pt stated he self catheterizes at home and has all his supplies to do so at bedside. Pt independent and up ad lib throughout shift. COVID admit test collected. Pt was oriented to his room and surroundings and instructed to call out for any assistance. Will continue to monitor and report off to oncoming RN.     Problem: Adult Inpatient Plan of Care  Goal: Plan of Care Review  Outcome: Ongoing - Unchanged  Goal: Patient-Specific Goal (Individualization)  Outcome: Ongoing - Unchanged  Goal: Absence of Hospital-Acquired Illness or Injury  Outcome: Ongoing - Unchanged  Goal: Optimal Comfort and Wellbeing  Outcome: Ongoing - Unchanged  Goal: Readiness for Transition of Care  Outcome: Ongoing - Unchanged  Goal: Rounds/Family Conference  Outcome: Ongoing - Unchanged

## 2020-03-29 NOTE — Unmapped (Signed)
NEW IMMUNOCOMPROMISED HOST INFECTIOUS DISEASE CONSULT NOTE      Howard Villarreal is being seen in consultation at the request of Wynn Banker, MD for evaluation of multifocal pneumonia.      Assessment/Recommendations:    Howard Villarreal is a 74 y.o. 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  - Current chemotherapy: Hydroxyurea 200 bid with goal of WBC <25k prior to Azacitadine/Venetoclax initiation and fedratinib  - Infection Prophylaxis: valacyclovir    Infection History  # Chronic minimal productive cough with mild intermittent shortness of breath with multifocal PNA (favor bacterial PNA but cannot exclude fungal PNA), 03/01/20  - 6/10 presents @[facility] with progressive non-productive cough x 10 day and intermittent SOB @[facility]  - CXR revealed bilateral perihilar airspace disease (right > left)   S/p Amoxi-clav 6/10 --> Azithroamoxi-clav 6/15-6/19  - 6/28 CT chest @[facility]: Multifocal ground-glass and consolidative opacity in the lungs   bilaterally, right greater than left. Other scattered tiny solid and sub solid pulmonary nodules in   both lungs  - 7/8 CT chest @[facility] : Multifocal right upper lobe ground-glass and consolidative opacities.    Pertinent Exposure History  Former Cabin crew up on farm         RECOMMENDATIONS FOR 03/29/2020    Diagnostic  Pls request CT chest images from [facility] to be uploaded to our system and have radiology review it comparing with our CT chest  Send sputum for bact cx/AFB/fungal cultures (if able to produce) - not for TB rule out, no need for airborne precaution  Send RPP full panel  Send urine histo Ag  Send serum crypto Ag  Send serum fungitell  Send urine legionella    Treatment  If RPP negative, can start a trial of ceftriaxone and azithromycin x5d as CAP treatment  If not improved within 72hrs of ABx treatment, would consider bronch (bact/AFB/fungal cx, legionella cx, actinomyces screen, asper Ag)          The [facility] ID service will sign off and follow-up pending labs.  Please page the ID Transplant/Liquid Oncology Fellow consult at [phone] with questions.  Patient discussed with Dr. Raylene Miyamoto.    Howard Cherylynn Ridges, MD  Fellow, [facility] Infectious Diseases    Attending attestation  I saw and evaluated the patient. I discussed and agree with the findings and the plan of care as documented in the fellow???s note.The patient is at risk of decompensation from infection due to underlying immunocompromise and/or mucosal barrier defects. The risks and benefits of initiating and/or continuing potentially toxic anti-infective therapies and diagnostic procedures were discussed with the care teams.        I personally reviewed updated microbiological culture and susceptibility data.    I personally reviewed updated relevant radiological studies.      [name], MD  Immunocompromised Host ID  [phone]      History of Present Illness:      Source of information includes:  Electronic Medical Records.  History obtained from:patient.    74 year old male with past medical history significant for BPH, chronic kidney disease, Crohn's disease, glaucoma, gout, myelofibrosis, right bundle branch block and currently being treated for polycythemia vera. He was started on fedratinib back in January 2021. Has not required therapeutic phlebotomy since 2016.    H/o PV diagnosed in 2013, then diagnosed with MF in 2017 and recently diagnosed AML at [facility] on 02/28/20.  Seen by [facility] onco office on 02/29/20 for complaints of a chronic cough that has been getting worse over the past 10 days. By the time, Patient notes cough began when he initiated his oral chemotherapy. CXR revealed bilateral perihilar airspace disease (right > left) concerning for infection. There was small bilateral pleural effusions. He was started on Augmentin x 10 days and azithromycin x 5 days. He reports after completion of 10d course of therapy, his cough and mild shortness of breath resolved. He was doing well approximately 2 weeks until his cough returned.    Upon interview, the patient reports persistent intermittent mild productive cough for the past 3 months.  He denies shortness of breath today.  He denies hemoptysis.  Denies decreased appetite or changes in his body weight.      Allergies:  No Known Allergies    Medications:   Current antibiotics:  none    Previous antibiotics:  amoxi-clav  azithro    Current/Prior immunomodulators:  hydroxyurea    Other medications reviewed.     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 Was on 6-MP for awhile; hasn't been on anything 2005.    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        Surgical History:  Past Surgical History:   Procedure Laterality Date    MOHS SURGERY  2016    left ear for non-melanoma skin cancer       Social History:  Tobacco use:   reports that he has quit smoking. He has never used smokeless tobacco.   Alcohol use:    reports current alcohol use of about 4.0 standard drinks of alcohol per week.   Drug use:    has no history on file for drug use.   Living situation:  Lives with spouse/partner   Residence:   city   Birth place  [place]   [place] travel:   [place]. 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    Review of Systems:  All other systems reviewed are negative.          Vital Signs last 24 hours:  Temp:  [36.4 ??C (97.5 ??F)-36.7 ??C (98.1 ??F)] 36.6 ??C (97.9 ??F)  Heart Rate:  [79-88] 85  Resp:  [20-22] 20  BP: (124-143)/(69-76) 143/74  MAP (mmHg):  [85-95] 95  SpO2:  [97 %-100 %] 99 %    Physical Exam:  Patient Lines/Drains/Airways Status      Active Active Lines, Drains, & Airways  Name:   Placement date:   Placement time:   Site:   Days:    Peripheral IV 03/28/20 Left Forearm   03/28/20    0930    Forearm   1                  Const: lying in bed comfortably, not in acute distress  Eyes: sclerae anicteric and non injected  ENT: no dental carries, no oral thrust, no ulcer  Lymph: no cervical or supraclavicular LAD  CV: RRR, no abnormal heart sounds noted and no peripheral edema  Resp: normal work of breathing at rest and CTAB posteriorly  GI: soft, NTND  GU: deferred  Skin: no petechiae, ecchymoses or obvious rashes on clothed exam. Scattered small bruises on B/L UE  MSK: no swollen joints  Neuro: no tremor noted, facial expression symmetric and moves extremities   Psych: calm, appropriate eye contact      Data for Medical Decision Making  ( [id number] )     Recent Labs   Lab Units 03/29/20  1332 03/29/20  0624 03/28/20  1640 03/28/20  0941   WBC 10*9/L 36.3* 42.6* 43.5* 55.7*   HEMOGLOBIN g/dL 16.1* 09.6* 04.5* 40.9*   PLATELET COUNT (1) 10*9/L 156 132* 152 139*   NEUTRO ABS 10*9/L 18.2* 19.6* 26.5* 30.1*   BUN mg/dL  --  33* - 33*  --  34*   CREATININE mg/dL  --  8.11* - 9.14*  --  1.61*   AST U/L  --  50  --  58*   ALT U/L  --  9  --  11   BILIRUBIN TOTAL mg/dL  --  0.7  --  0.8   ALK PHOS U/L  --  93  --  94   POTASSIUM mmol/L  --  4.2 - 4.2  --  4.8   MAGNESIUM mg/dL 1.8 1.9 2.0 2.1   PHOSPHORUS mg/dL 5.0* 4.7 4.8* 4.5   CALCIUM mg/dL  --  8.2* - 8.2*  --  8.3*         New Culture Data  [facility] ) Microbiology Results (last day)       Procedure Component Value Date/Time Date/Time    COVID-19 PCR [[id number]]  (Normal) Collected: 03/28/20 1713    Lab Status: Final result Specimen: Nasopharyngeal/Oropharyngeal Swab Updated: 03/28/20 2149     SARS-CoV-2 PCR Not Detected    Narrative:      This test was performed using the Abbott Alinity m SARS-CoV-2 assay which has been validated by the CLIA-certified, CAP-inspected [facility] Clinical Molecular Microbiology Laboratory. FDA has granted Emergency Use Authorization for this test. This real-time RT-PCR test detects SARS-CoV-2 by targeting the N and RdRp genes. Negative results do not preclude SARS-CoV-2 infection and should not be used as the sole basis for patient management decisions. Negative results must be combined with clinical observations, patient history, and epidemiological information.     Information for providers and patients can be found here: [email or web address]                 Recent Studies  ( RISRSLT )    CT Chest Wo Contrast    Result Date: 03/29/2020  EXAM: CT CHEST WO CONTRAST DATE: 03/29/2020 2:24 AM ACCESSION: [id number] UN DICTATED: 03/29/2020 4:48 AM INTERPRETATION LOCATION: [facility] CLINICAL INDICATION: 74 years old Male with f/u ct on 6/28 without resoultion of symptoms  COMPARISON: There are no comparison studies available. TECHNIQUE: A helical CT scan was  obtained without IV contrast from the thoracic inlet through the hemidiaphragms. Images were reconstructed in the axial plane.  Coronal and sagittal reformatted images of the chest were also provided for further evaluation of the lung parenchyma. FINDINGS: AIRWAYS, LUNGS, PLEURA: Clear central airways. Small bilateral pleural effusions. Multifocal right upper lobe ground-glass and consolidative opacities. MEDIASTINUM: Heart size qualitatively normal. Coronary atherosclerosis. No pericardial effusion. Normal caliber thoracic aorta.  No mediastinal lymphadenopathy. IMAGED ABDOMEN: Splenomegaly; partially imaged spleen measures 18 cm. Right hydronephrosis. SOFT TISSUES: Unremarkable. BONES: Unremarkable.     Multifocal right upper lobe airspace opacities; likely pneumonia. Small bilateral pleural effusions. Splenomegaly and right hydronephrosis; recommend dedicated CT abdomen and pelvis to further evaluate.      Serologies:  Lab Results   Component Value Date    Hep B Surface Ag Nonreactive 03/06/2020    Hep B S Ab Nonreactive 03/06/2020    Hep B Surf Ab Quant <8.00 03/06/2020    Hepatitis C Ab Nonreactive 03/06/2020       Immunizations:  Immunization History   Administered Date(s) Administered    COVID-19 VACCINE,MRNA(MODERNA)(PF)(IM) 10/28/2019, 11/29/2019    Influenza Vaccine Quad (IIV4 PF) 71mo+ injectable 07/06/2017

## 2020-03-30 ENCOUNTER — Ambulatory Visit: Admit: 2020-03-30 | Payer: Medicare Other | Admitting: Pulmonary Disease

## 2020-03-30 DIAGNOSIS — R918 Other nonspecific abnormal finding of lung field: Secondary | ICD-10-CM | POA: Diagnosis not present

## 2020-03-30 DIAGNOSIS — D6959 Other secondary thrombocytopenia: Secondary | ICD-10-CM | POA: Diagnosis not present

## 2020-03-30 DIAGNOSIS — R600 Localized edema: Secondary | ICD-10-CM | POA: Diagnosis not present

## 2020-03-30 DIAGNOSIS — C9200 Acute myeloblastic leukemia, not having achieved remission: Secondary | ICD-10-CM | POA: Diagnosis not present

## 2020-03-30 LAB — BASIC METABOLIC PANEL
ANION GAP: 8 mmol/L (ref 7–15)
BLOOD UREA NITROGEN: 37 mg/dL — ABNORMAL HIGH (ref 7–21)
BLOOD UREA NITROGEN: 38 mg/dL — ABNORMAL HIGH (ref 7–21)
BUN / CREAT RATIO: 23
CALCIUM: 8 mg/dL — ABNORMAL LOW (ref 8.5–10.2)
CALCIUM: 8 mg/dL — ABNORMAL LOW (ref 8.5–10.2)
CHLORIDE: 105 mmol/L (ref 98–107)
CO2: 22 mmol/L (ref 22.0–30.0)
CO2: 20 mmol/L — ABNORMAL LOW (ref 22.0–30.0)
CREATININE: 1.6 mg/dL — ABNORMAL HIGH (ref 0.70–1.30)
CREATININE: 1.54 mg/dL — ABNORMAL HIGH (ref 0.70–1.30)
EGFR CKD-EPI AA MALE: 51 mL/min/{1.73_m2} — ABNORMAL LOW (ref >=60)
EGFR CKD-EPI NON-AA MALE: 42 mL/min/{1.73_m2} — ABNORMAL LOW (ref >=60)
EGFR CKD-EPI NON-AA MALE: 44 mL/min/{1.73_m2} — ABNORMAL LOW (ref >=60)
GLUCOSE RANDOM: 77 mg/dL (ref 70–179)
GLUCOSE RANDOM: 102 mg/dL (ref 70–179)
POTASSIUM: 4.4 mmol/L (ref 3.5–5.0)
SODIUM: 135 mmol/L (ref 135–145)
SODIUM: 135 mmol/L (ref 135–145)

## 2020-03-30 LAB — LEGIONELLA, URINARY ANTIGEN: Legionella pneumophila 1 Ag:PrThr:Pt:Urine:Ord:IA: NEGATIVE

## 2020-03-30 LAB — COMPREHENSIVE METABOLIC PANEL
ALBUMIN: 3.1 g/dL — ABNORMAL LOW (ref 3.5–5.0)
ALKALINE PHOSPHATASE: 91 U/L (ref 38–126)
ALT (SGPT): 8 U/L (ref <50)
ANION GAP: 8 mmol/L (ref 7–15)
AST (SGOT): 49 U/L (ref 19–55)
BILIRUBIN TOTAL: 0.5 mg/dL (ref 0.0–1.2)
BLOOD UREA NITROGEN: 37 mg/dL — ABNORMAL HIGH (ref 7–21)
CALCIUM: 8 mg/dL — ABNORMAL LOW (ref 8.5–10.2)
CO2: 22 mmol/L (ref 22.0–30.0)
CREATININE: 1.6 mg/dL — ABNORMAL HIGH (ref 0.70–1.30)
EGFR CKD-EPI AA MALE: 49 mL/min/{1.73_m2} — ABNORMAL LOW (ref >=60)
EGFR CKD-EPI NON-AA MALE: 42 mL/min/{1.73_m2} — ABNORMAL LOW (ref >=60)
GLUCOSE RANDOM: 77 mg/dL (ref 70–179)
POTASSIUM: 4 mmol/L (ref 3.5–5.0)
PROTEIN TOTAL: 7.4 g/dL (ref 6.5–8.3)
SODIUM: 135 mmol/L (ref 135–145)

## 2020-03-30 LAB — CBC W/ AUTO DIFF
HEMATOCRIT: 33.9 % — ABNORMAL LOW (ref 41.0–53.0)
HEMATOCRIT: 35.9 % — ABNORMAL LOW (ref 41.0–53.0)
HEMOGLOBIN: 10.1 g/dL — ABNORMAL LOW (ref 13.5–17.5)
HEMOGLOBIN: 10.7 g/dL — ABNORMAL LOW (ref 13.5–17.5)
MEAN CORPUSCULAR HEMOGLOBIN CONC: 29.7 g/dL — ABNORMAL LOW (ref 31.0–37.0)
MEAN CORPUSCULAR HEMOGLOBIN CONC: 29.9 g/dL — ABNORMAL LOW (ref 31.0–37.0)
MEAN CORPUSCULAR HEMOGLOBIN: 27.5 pg (ref 26.0–34.0)
MEAN CORPUSCULAR VOLUME: 92.9 fL (ref 80.0–100.0)
MEAN CORPUSCULAR VOLUME: 91.8 fL (ref 80.0–100.0)
MEAN PLATELET VOLUME: 11.9 fL — ABNORMAL HIGH (ref 7.0–10.0)
NUCLEATED RED BLOOD CELLS: 1 /100{WBCs} (ref <=4)
NUCLEATED RED BLOOD CELLS: 3 /100{WBCs} (ref <=4)
PLATELET COUNT: 123 10*9/L — ABNORMAL LOW (ref 150–440)
RED BLOOD CELL COUNT: 3.65 10*12/L — ABNORMAL LOW (ref 4.50–5.90)
RED CELL DISTRIBUTION WIDTH: 23.4 % — ABNORMAL HIGH (ref 12.0–15.0)
RED CELL DISTRIBUTION WIDTH: 23.5 % — ABNORMAL HIGH (ref 12.0–15.0)
WBC ADJUSTED: 35.5 10*9/L — ABNORMAL HIGH (ref 4.5–11.0)
WBC ADJUSTED: 36.7 10*9/L — ABNORMAL HIGH (ref 4.5–11.0)

## 2020-03-30 LAB — MANUAL DIFFERENTIAL
BASOPHILS - ABS (DIFF): 2.1 10*9/L — ABNORMAL HIGH (ref 0.0–0.1)
BASOPHILS - ABS (DIFF): 0.7 10*9/L — ABNORMAL HIGH (ref 0.0–0.1)
BASOPHILS - REL (DIFF): 6 %
BASOPHILS - REL (DIFF): 2 %
BLASTS - REL (DIFF): 40 % (ref <=0)
BLASTS - REL (DIFF): 30 % (ref <=0)
EOSINOPHILS - ABS (DIFF): 0 10*9/L (ref 0.0–0.4)
EOSINOPHILS - ABS (DIFF): 0.4 10*9/L (ref 0.0–0.4)
EOSINOPHILS - REL (DIFF): 0 %
EOSINOPHILS - REL (DIFF): 1 %
LYMPHOCYTES - ABS (DIFF): 1.8 10*9/L (ref 1.5–5.0)
LYMPHOCYTES - ABS (DIFF): 4.4 10*9/L (ref 1.5–5.0)
LYMPHOCYTES - REL (DIFF): 5 %
MONOCYTES - ABS (DIFF): 0.4 10*9/L (ref 0.2–0.8)
MONOCYTES - ABS (DIFF): 0 10*9/L — ABNORMAL LOW (ref 0.2–0.8)
MONOCYTES - REL (DIFF): 1 %
MONOCYTES - REL (DIFF): 0 %
NEUTROPHILS - ABS (DIFF): 17 10*9/L — ABNORMAL HIGH (ref 2.0–7.5)
NEUTROPHILS - ABS (DIFF): 20.2 10*9/L — ABNORMAL HIGH (ref 2.0–7.5)
NEUTROPHILS - REL (DIFF): 48 %
NEUTROPHILS - REL (DIFF): 55 %

## 2020-03-30 LAB — CALCIUM: Calcium:MCnc:Pt:Ser/Plas:Qn:: 8 — ABNORMAL LOW

## 2020-03-30 LAB — POTASSIUM: Potassium:SCnc:Pt:Ser/Plas:Qn:: 4.4

## 2020-03-30 LAB — PHOSPHORUS
Phosphate:MCnc:Pt:Ser/Plas:Qn:: 4.8 — ABNORMAL HIGH
Phosphate:MCnc:Pt:Ser/Plas:Qn:: 5.4 — ABNORMAL HIGH

## 2020-03-30 LAB — LYMPHOCYTES - ABS (DIFF): Lymphocytes:NCnc:Pt:Bld:Qn:: 1.8

## 2020-03-30 LAB — GLUCOSE RANDOM: Glucose:MCnc:Pt:Ser/Plas:Qn:: 77

## 2020-03-30 LAB — MEAN CORPUSCULAR HEMOGLOBIN: Erythrocyte mean corpuscular hemoglobin:EntMass:Pt:RBC:Qn:Automated count: 27.6

## 2020-03-30 LAB — LACTATE DEHYDROGENASE: Lactate dehydrogenase:CCnc:Pt:Ser/Plas:Qn:Reaction: pyruvate to lactate: 1661 — ABNORMAL HIGH

## 2020-03-30 LAB — BASOPHILS - REL (DIFF): Basophils/100 leukocytes:NFr:Pt:Bld:Qn:Manual count: 2

## 2020-03-30 LAB — URIC ACID
Urate:MCnc:Pt:Ser/Plas:Qn:: 6.5
Urate:MCnc:Pt:Ser/Plas:Qn:: 7.1

## 2020-03-30 LAB — MAGNESIUM: Magnesium:MCnc:Pt:Ser/Plas:Qn:: 1.8

## 2020-03-30 LAB — HEMATOCRIT: Hematocrit:VFr:Pt:Bld:Qn:: 35.9 — ABNORMAL LOW

## 2020-03-30 SURGERY — VIDEO BRONCHOSCOPY WITH ENDOBRONCHIAL NAVIGATION
Anesthesia: General

## 2020-03-30 MED ADMIN — allopurinoL (ZYLOPRIM) tablet 300 mg: 300 mg | ORAL | @ 13:00:00

## 2020-03-30 MED ADMIN — sodium bicarbonate tablet 650 mg: 650 mg | ORAL

## 2020-03-30 MED ADMIN — azithromycin (ZITHROMAX) tablet 500 mg: 500 mg | ORAL | @ 17:00:00 | Stop: 2020-03-30

## 2020-03-30 MED ADMIN — white petrolatum (VASELINE) jelly: TOPICAL | @ 13:00:00

## 2020-03-30 MED ADMIN — micafungin (MYCAMINE) 100 mg in sodium chloride (NS) 0.9 % 100 mL IVPB: 100 mg | INTRAVENOUS | @ 13:00:00

## 2020-03-30 MED ADMIN — latanoprost (XALATAN) 0.005 % ophthalmic solution 1 drop: 1 [drp] | OPHTHALMIC

## 2020-03-30 MED ADMIN — white petrolatum (VASELINE) jelly: TOPICAL

## 2020-03-30 MED ADMIN — finasteride (PROSCAR) tablet 5 mg: 5 mg | ORAL | @ 13:00:00

## 2020-03-30 MED ADMIN — cefTRIAXone (ROCEPHIN) 1 g in sodium chloride 0.9 % (NS) 100 mL IVPB-connector bag: 1 g | INTRAVENOUS | @ 17:00:00 | Stop: 2020-04-04

## 2020-03-30 MED ADMIN — hydroxyurea (HYDREA) capsule 2,000 mg: 2000 mg | ORAL | @ 14:00:00

## 2020-03-30 MED ADMIN — micafungin (MYCAMINE) 100 mg in sodium chloride (NS) 0.9 % 100 mL IVPB: 100 mg | INTRAVENOUS

## 2020-03-30 MED ADMIN — hydroxyurea (HYDREA) capsule 2,000 mg: 2000 mg | ORAL | @ 06:00:00

## 2020-03-30 MED FILL — VENCLEXTA 100 MG TABLET: 30 days supply | Qty: 120 | Fill #0 | Status: AC

## 2020-03-30 NOTE — Unmapped (Signed)
Problem: Adult Inpatient Plan of Care  Goal: Plan of Care Review  Outcome: Progressing  Goal: Patient-Specific Goal (Individualization)  Outcome: Progressing  Goal: Absence of Hospital-Acquired Illness or Injury  Outcome: Progressing  Goal: Optimal Comfort and Wellbeing  Outcome: Progressing  Goal: Readiness for Transition of Care  Outcome: Progressing  Goal: Rounds/Family Conference  Outcome: Progressing     Problem: Infection  Goal: Infection Symptom Resolution  Outcome: Progressing

## 2020-03-30 NOTE — Unmapped (Signed)
Pt remained free of falls, VSS and afebrile throughout shift. A&Ox4. No complaints of pain. Pt was up ad lib and independent throughout shift. Safety precautions maintained throughout shift. Will continue to monitor and report off to oncoming RN.

## 2020-03-30 NOTE — Unmapped (Signed)
Daily Progress Note        Principal Problem:    Acute myeloid leukemia not having achieved remission (CMS-HCC)       LOS: 2 days     Assessment/Plan: Howard Villarreal is a 74 y.o. male with PMHx of Recurrent Epistaxsis, Crohn's Disease, CKD, and PV with MF with recent progression to AML admitted with worsening leukocytosis and c/f fungal PNA    Summary of Today's Plan:  WBC slowly trending down, continue Hydrea 2g TID. Plan to start Azacitidine and Ventoclax for treatment of newly diagnosed AML when WBC are below 25k. Monitoring CBC and TLS labs Q12H. Start IVF at 75 mL/hr, continue Allopurinol BID. ICID consulted yesterday for what is felt to be persistent CAP, s/p course of Azythromycin and Augmentin. CT chest images from [facility] requested for radiology review at [facility]. Given RPP negative, will start Ceftriaxone and Azithromycin 5 day course today, send additional serum/urine studies as detailed below. Today pt afebrile and well-appearing, not producing sputum for sampling. Continue ppx Micafungin and Valtrex as well. Continue home Bicarb tabs for CKD, Timolol and Latanoprost Eye drops of glaucoma, and FloMax and self catherterization for Bladder muscle dysfunction.    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 Initally treated with intermittent phlebotomy, ruxonlitinib and hydrea and most recently Fedratinib which he was stopped. He is followed locally by Dr. Stacey Drain [facility]). 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 Cytogenetics were normal (46, XY). He underwent another BMBx on 04/07/16 which revealed >95% cellular marrow with 5% blasts by manual aspirate differential, mild reticulin fibrosis, mild dyserythropoiesis with molecular mutations in:??JAK2 V617F, IDH2, RUNX1, SRSF2. He had labs done recently at [facility] which were concerning for acute leukemia (AML) based on falling hemoglobin and 26% blasts reportedly seen from peripheral blood. He underwent a BMBx on 02/28/20 which confirmed AML progression with 22% myeloblasts, unfortunately insufficient sample for flow. 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 Peripheral blood smear revealed 17% circulating myeloblasts with WBC 53.2k. He was started on Hydrea 500 mg BID in addition to allopurinol on 03/06/20. He discussed his treatment options with Dr. Anise Salvo with decision to focus on quality of life and pursue less intensive treatment with Aza/Ven. He was seen in clinic on 03/12/20 with WBC now 67k and his Hydrea was increased to 1000 mg BID with goal WBC <10k prior to treatment initiation. He requested his therapy be delayed ~2 weeks in order to spend time with family. On admission there is c/f fungal PNA and persistent hyperleukocytosis.  His WBC on admission was 55k.  - Hydrea 2000 mg Q8H  - Allopurinol 300 mg BID  - NS @ 75 mL/hr started 7/9  - Plan to start Azacitadine/Venetoclax once WBC less than 25  - TLS Labs and CBC Q12H  - Valtrex, Micafungin ppx    Recent CAP now c/f possible Fungal PNA: Symptoms include cough, SOB/DOE and decreased exercise intolerance. He recently completed a course of Augmentin and Azithromycin with brief resolution of symptoms. He was seen in clinic on 6/15 and reported ongoing cough. He underwent a [facility] Chest [facility] 6/28 which revealed multifocal GGO, R >L as well as associated consolidative opacity  in RML with associated collapse as well as scattered tiny pulmonary nodules and bilateral trace pleural effusions. Incidental finding of massive splenomegaly with markedly increased size since prior CT A/P on 04/03/19. CT chest repeated at [facility] on 7/8 revealed multifocal RUL opacities; likely PNA. Small B pleural effusions. Splenomegaly and R hydronephrosis. ICID consulted on 7/8, recommendations below; signed off.   [ ]  CT chest read and formal comparison requested 7/9, f/u results  - Per ICID, will retreat for CAP with a 5-day course of Ceftriaxone and Azithromycin (7/9-7/13)  - Micafungin ppx as above  [ ]  sputum sample ordered (bacterial and fungal cx, AFB) - not currently producing sputum  [ ]  f/u results of: urine histo, serum crypto, serum fungitell  - Negative results: RPP/COVID, urine legionella  - Can consider bronchoscopy if no improvement 72 hrs post abx (~Tuesday)    Recent Epistaxis: Per chart review patient has history of nosebleeds requiring cauterization. Was recently seen by ENT with cautherization to right nasal passage.  - Afrin PRN and hold pressure for 20 seconds with any reoccurrence  - Consider increasing PLT threshold if difficult to control  - Vaseline and nasal saline for nasal dryness    Hx CKD: Baseline Cr appears to be around 1.5-1.8. Upon admission Cr 1.61   - Close monitoring for TLS as above  - Avoid nephrotoxic agents as able, Renally dose medications as indicated  - Continue Sodium Bicarb tabs BID  ??  Hx Crohn's Disease: Patient denies any abdominal pain, melena or BRBPR. He is not currently on any medical management.  - Monitor for symptom development  - Hold home multivitamins  ??  Bladder Muscle Dysfunction  - I/O Cath per patient's home regimen  - Continue FloMax  ??  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 Expected to become pancytopenic in coming days with initiation of Hydrea/chemotherapy   - Transfuse 1 unit of pRBCs for hgb >7  - Transfuse 1 unit plt for plts >10K     Nutrition:                        Subjective:   Feeling well this morning. Has been ambulatory around halls with stable slight decrease in exercise tolerance but no SOB/DOE. Rare dry cough, not productive. Has stable mild BLE edema. Denies CP.  Denies N/V/D/C, bleeding. Having some early satiety/decreased appetite, which he attributes to known splenomegaly.    Objective:     Vital signs in last 24 hours:  Temp:  [36.4 ??C (97.5 ??F)-36.7 ??C (98.1 ??F)] 36.5 ??C (97.7 ??F)  Heart Rate:  [85-92] 91  Resp:  [20-22] 22  BP: (124-148)/(70-89) 131/73  MAP (mmHg):  [95-103] 101  SpO2:  [95 %-100 %] 99 %    Intake/Output last 3 shifts:  I/O last 3 completed shifts:  In: 790 [P.O.:780; I.V.:10]  Out: 2550 [Urine:2550]    Meds:  Current Facility-Administered Medications   Medication Dose Route Frequency Provider Last Rate Last Admin   ??? allopurinoL (ZYLOPRIM) tablet 300 mg  300 mg Oral BID Herbert Moors, AGNP   300 mg at 03/30/20 0850   ??? azithromycin (ZITHROMAX) tablet 500 mg  500 mg Oral Once [facility], AGNP        Followed by   ??? [START ON 03/31/2020] azithromycin (ZITHROMAX) tablet 250 mg  250 mg  Oral Q24H [facility] [address] [place], [place]       ??? cefTRIAXone (ROCEPHIN) 1 g in sodium chloride 0.9 % (NS) 100 mL IVPB-connector bag  1 g Intravenous Q24H [address], AGNP       ??? emollient combination no.92 (LUBRIDERM) lotion 1 application  1 application Topical Q1H PRN Herbert Moors, AGNP       ??? finasteride (PROSCAR) tablet 5 mg  5 mg Oral Daily Herbert Moors, AGNP   5 mg at 03/30/20 8119   ??? hydroxyurea (HYDREA) capsule 2,000 mg  2,000 mg Oral [address] [place], AGNP   2,000 mg at 03/30/20 0133   ??? latanoprost (XALATAN) 0.005 % ophthalmic solution 1 drop  1 drop Both Eyes Nightly Herbert Moors, AGNP   1 drop at 03/29/20 2012   ??? loperamide (IMODIUM) capsule 2 mg  2 mg Oral Q2H PRN Herbert Moors, AGNP       ??? loperamide (IMODIUM) capsule 4 mg  4 mg Oral Once PRN Herbert Moors, AGNP       ??? micafungin (MYCAMINE) 100 mg in sodium chloride (NS) 0.9 % 100 mL IVPB  100 mg Intravenous Q24H [facility] Herbert Moors, AGNP 120 mL/hr at 03/30/20 0849 100 mg at 03/30/20 0849   ??? sodium bicarbonate tablet 650 mg  650 mg Oral BID Herbert Moors, [place]   650 mg at 03/30/20 0850   ??? sodium chloride (NS) 0.9 % infusion  75 mL/hr Intravenous Continuous [facility] [place] [facility], AGNP 75 mL/hr at 03/30/20 0849 75 mL/hr at 03/30/20 0849   ??? timolol (TIMOPTIC) 0.5 % ophthalmic solution 1 drop  1 drop Both Eyes Daily Herbert Moors, AGNP   1 drop at 03/30/20 1478   ??? valACYclovir (VALTREX) tablet 500 mg  500 mg Oral Daily Herbert Moors, AGNP   500 mg at 03/30/20 2956   ??? white petrolatum (VASELINE) jelly   Topical BID Herbert Moors, [place]   Given at 03/30/20 2130       Physical Exam:  General: Well developed, well-nourished male in no acute distress.   Neuro: A&Ox4. Appropriate affect. Speech fluent. CNII-CNXII grossly intact. Gait steady.   HEENT: PER. Hearing intact to conversation. No scleral icterus or conjunctival injection. MMM. Oropharynx without ulceration, erythema, or exudate.   Respiratory: Breathing is unlabored. Lungs CTAB. No rhonchi, wheezes, or crackles.    Cardiovascular: RRR.  S1, S2 present.  No m/r/g. Pulses 2+ and symmetric bilaterally.  Extremities well perfused.   GI: Soft, NTND abdomen. Active bowel sounds, splenomegaly. Otherwise, no palpable masses.  Musculoskeletal: No grossly-evident deformities. ROM grossly intact in all 4 extremities. No clubbing or cyanosis. 1+ BLE edema.   Skin: Warm, dry. No rashes, obvious lesions, excessive petechiae/purpura.   CVAD: None (PIV)    Labs:  Recent Labs     03/28/20  0941 03/29/20  0624 03/29/20  1332 03/30/20  0508   WBC 55.7* 42.6* 36.3* 35.5*   NEUTROABS 30.1* 19.6* 18.2* 17.0*   HGB 10.7* 10.2* 11.0* 10.1*   HCT 36.1* 34.9* 35.7* 33.9*   PLT 139* 132* 156 123*   CREATININE 1.61* 1.50* - 1.50*  --  1.60* - 1.60*   BUN 34* 33* - 33*  --  37* - 37*   BILITOT 0.8 0.7  --  0.5   AST 58* 50  --  49   ALT 11 9  --  8   ALKPHOS 94 93  -- 91  K 4.8 4.2 - 4.2  --  4.0 - 4.0   MG 2.1 1.9 1.8 1.8   CALCIUM 8.3* 8.2* - 8.2*  --  8.0* - 8.0*   NA 138 134* - 134*  --  135 - 135   CL 106 106 - 106  --  105 - 105   CO2 26.0 22.0 - 22.0  --  22.0 - 22.0   PHOS 4.5 4.7 5.0* 4.8*   INR 1.21  --   --   --        Imaging:  Reviewed per EMR.    Glendell Docker, NP  Hematology/Oncology  Group pager: [phone]    03/30/20

## 2020-03-31 DIAGNOSIS — R918 Other nonspecific abnormal finding of lung field: Secondary | ICD-10-CM | POA: Diagnosis not present

## 2020-03-31 DIAGNOSIS — D6959 Other secondary thrombocytopenia: Secondary | ICD-10-CM | POA: Diagnosis not present

## 2020-03-31 DIAGNOSIS — C9200 Acute myeloblastic leukemia, not having achieved remission: Secondary | ICD-10-CM | POA: Diagnosis not present

## 2020-03-31 DIAGNOSIS — D630 Anemia in neoplastic disease: Secondary | ICD-10-CM | POA: Diagnosis not present

## 2020-03-31 LAB — MANUAL DIFFERENTIAL
BASOPHILS - ABS (DIFF): 0.6 10*9/L — ABNORMAL HIGH (ref 0.0–0.1)
BASOPHILS - ABS (DIFF): 0.3 10*9/L — ABNORMAL HIGH (ref 0.0–0.1)
BASOPHILS - REL (DIFF): 2 %
BASOPHILS - REL (DIFF): 1 %
BLASTS - REL (DIFF): 22 % (ref <=0)
BLASTS - REL (DIFF): 31 % (ref <=0)
EOSINOPHILS - ABS (DIFF): 0.6 10*9/L — ABNORMAL HIGH (ref 0.0–0.4)
EOSINOPHILS - REL (DIFF): 2 %
LYMPHOCYTES - ABS (DIFF): 0.9 10*9/L — ABNORMAL LOW (ref 1.5–5.0)
LYMPHOCYTES - REL (DIFF): 3 %
LYMPHOCYTES - REL (DIFF): 4 %
MONOCYTES - ABS (DIFF): 0.6 10*9/L (ref 0.2–0.8)
NEUTROPHILS - ABS (DIFF): 21 10*9/L — ABNORMAL HIGH (ref 2.0–7.5)
NEUTROPHILS - REL (DIFF): 60 %

## 2020-03-31 LAB — BASIC METABOLIC PANEL
ANION GAP: 5 mmol/L — ABNORMAL LOW (ref 7–15)
ANION GAP: 9 mmol/L (ref 7–15)
BUN / CREAT RATIO: 25
BUN / CREAT RATIO: 28
CALCIUM: 8.2 mg/dL — ABNORMAL LOW (ref 8.5–10.2)
CALCIUM: 8.2 mg/dL — ABNORMAL LOW (ref 8.5–10.2)
CHLORIDE: 108 mmol/L — ABNORMAL HIGH (ref 98–107)
CHLORIDE: 109 mmol/L — ABNORMAL HIGH (ref 98–107)
CO2: 21 mmol/L — ABNORMAL LOW (ref 22.0–30.0)
CO2: 21 mmol/L — ABNORMAL LOW (ref 22.0–30.0)
CREATININE: 1.46 mg/dL — ABNORMAL HIGH (ref 0.70–1.30)
CREATININE: 1.35 mg/dL — ABNORMAL HIGH (ref 0.70–1.30)
EGFR CKD-EPI AA MALE: 60 mL/min/{1.73_m2} (ref >=60)
EGFR CKD-EPI NON-AA MALE: 47 mL/min/{1.73_m2} — ABNORMAL LOW (ref >=60)
GLUCOSE RANDOM: 86 mg/dL (ref 70–179)
GLUCOSE RANDOM: 87 mg/dL (ref 70–179)
POTASSIUM: 4.7 mmol/L (ref 3.5–5.0)
SODIUM: 134 mmol/L — ABNORMAL LOW (ref 135–145)
SODIUM: 139 mmol/L (ref 135–145)

## 2020-03-31 LAB — CBC W/ AUTO DIFF
HEMATOCRIT: 34.2 % — ABNORMAL LOW (ref 41.0–53.0)
HEMATOCRIT: 38.6 % — ABNORMAL LOW (ref 41.0–53.0)
HEMOGLOBIN: 10.5 g/dL — ABNORMAL LOW (ref 13.5–17.5)
HEMOGLOBIN: 11.4 g/dL — ABNORMAL LOW (ref 13.5–17.5)
MEAN CORPUSCULAR HEMOGLOBIN CONC: 30.7 g/dL — ABNORMAL LOW (ref 31.0–37.0)
MEAN CORPUSCULAR HEMOGLOBIN CONC: 29.7 g/dL — ABNORMAL LOW (ref 31.0–37.0)
MEAN CORPUSCULAR HEMOGLOBIN: 28 pg (ref 26.0–34.0)
MEAN CORPUSCULAR HEMOGLOBIN: 27.7 pg (ref 26.0–34.0)
MEAN CORPUSCULAR VOLUME: 91.1 fL (ref 80.0–100.0)
MEAN CORPUSCULAR VOLUME: 93.5 fL (ref 80.0–100.0)
MEAN PLATELET VOLUME: 8.4 fL (ref 7.0–10.0)
MEAN PLATELET VOLUME: 11.9 fL — ABNORMAL HIGH (ref 7.0–10.0)
NUCLEATED RED BLOOD CELLS: 3 /100{WBCs} (ref <=4)
NUCLEATED RED BLOOD CELLS: 1 /100{WBCs} (ref <=4)
PLATELET COUNT: 97 10*9/L — ABNORMAL LOW (ref 150–440)
PLATELET COUNT: 119 10*9/L — ABNORMAL LOW (ref 150–440)
RED BLOOD CELL COUNT: 4.13 10*12/L — ABNORMAL LOW (ref 4.50–5.90)
RED CELL DISTRIBUTION WIDTH: 23.5 % — ABNORMAL HIGH (ref 12.0–15.0)
RED CELL DISTRIBUTION WIDTH: 23.3 % — ABNORMAL HIGH (ref 12.0–15.0)
WBC ADJUSTED: 28.7 10*9/L — ABNORMAL HIGH (ref 4.5–11.0)
WBC ADJUSTED: 29.5 10*9/L — ABNORMAL HIGH (ref 4.5–11.0)

## 2020-03-31 LAB — HEPATIC FUNCTION PANEL
ALBUMIN: 3 g/dL — ABNORMAL LOW (ref 3.5–5.0)
ALKALINE PHOSPHATASE: 95 U/L (ref 38–126)
ALT (SGPT): 8 U/L (ref <50)
AST (SGOT): 48 U/L (ref 19–55)
PROTEIN TOTAL: 7.4 g/dL (ref 6.5–8.3)

## 2020-03-31 LAB — URIC ACID
Urate:MCnc:Pt:Ser/Plas:Qn:: 5.4
Urate:MCnc:Pt:Ser/Plas:Qn:: 5.8

## 2020-03-31 LAB — PHOSPHORUS
PHOSPHORUS: 5 mg/dL — ABNORMAL HIGH (ref 2.9–4.7)
Phosphate:MCnc:Pt:Ser/Plas:Qn:: 5 — ABNORMAL HIGH
Phosphate:MCnc:Pt:Ser/Plas:Qn:: 5.1 — ABNORMAL HIGH

## 2020-03-31 LAB — LACTATE DEHYDROGENASE: Lactate dehydrogenase:CCnc:Pt:Ser/Plas:Qn:Reaction: pyruvate to lactate: 1644 — ABNORMAL HIGH

## 2020-03-31 LAB — NEUTROPHILS - REL (DIFF): Neutrophils/100 leukocytes:NFr:Pt:Bld:Qn:Manual count: 73

## 2020-03-31 LAB — MAGNESIUM: Magnesium:MCnc:Pt:Ser/Plas:Qn:: 1.9

## 2020-03-31 LAB — CRYPTOCOCCAL ANTIGEN: Lab: NEGATIVE

## 2020-03-31 LAB — ALT (SGPT): Alanine aminotransferase:CCnc:Pt:Ser/Plas:Qn:: 8

## 2020-03-31 LAB — MACROCYTES

## 2020-03-31 LAB — EGFR CKD-EPI NON-AA MALE
Glomerular filtration rate/1.73 sq M.predicted.non black:ArVRat:Pt:Ser/Plas/Bld:Qn:Creatinine-based formula (CKD-EPI): 47 — ABNORMAL LOW

## 2020-03-31 LAB — CO2: Carbon dioxide:SCnc:Pt:Ser/Plas:Qn:: 21 — ABNORMAL LOW

## 2020-03-31 LAB — POLYCHROMASIA

## 2020-03-31 LAB — HEMATOCRIT: Hematocrit:VFr:Pt:Bld:Qn:: 38.6 — ABNORMAL LOW

## 2020-03-31 MED ADMIN — white petrolatum (VASELINE) jelly: TOPICAL | @ 13:00:00

## 2020-03-31 MED ADMIN — oxymetazoline (AFRIN) 0.05 % nasal spray 2 spray: 2 | NASAL | @ 14:00:00

## 2020-03-31 MED ADMIN — valACYclovir (VALTREX) tablet 500 mg: 500 mg | ORAL | @ 13:00:00

## 2020-03-31 MED ADMIN — timolol (TIMOPTIC) 0.5 % ophthalmic solution 1 drop: 1 [drp] | OPHTHALMIC | @ 13:00:00

## 2020-03-31 MED ADMIN — azithromycin (ZITHROMAX) tablet 250 mg: 250 mg | ORAL | @ 13:00:00 | Stop: 2020-04-04

## 2020-03-31 MED ADMIN — sodium chloride (NS) 0.9 % infusion: 75 mL/h | INTRAVENOUS | @ 06:00:00

## 2020-03-31 MED ADMIN — latanoprost (XALATAN) 0.005 % ophthalmic solution 1 drop: 1 [drp] | OPHTHALMIC | @ 02:00:00

## 2020-03-31 MED ADMIN — finasteride (PROSCAR) tablet 5 mg: 5 mg | ORAL | @ 13:00:00

## 2020-03-31 MED ADMIN — sodium bicarbonate tablet 650 mg: 650 mg | ORAL | @ 01:00:00

## 2020-03-31 MED ADMIN — cyanocobalamin (vitamin B-12) tablet 1,000 mcg: 1000 ug | ORAL | @ 20:00:00

## 2020-03-31 MED ADMIN — sodium bicarbonate tablet 650 mg: 650 mg | ORAL | @ 13:00:00

## 2020-03-31 MED ADMIN — allopurinoL (ZYLOPRIM) tablet 300 mg: 300 mg | ORAL | @ 13:00:00

## 2020-03-31 MED ADMIN — allopurinoL (ZYLOPRIM) tablet 300 mg: 300 mg | ORAL | @ 01:00:00

## 2020-03-31 MED ADMIN — hydroxyurea (HYDREA) capsule 2,000 mg: 2000 mg | ORAL | @ 06:00:00

## 2020-03-31 MED ADMIN — hydroxyurea (HYDREA) capsule 2,000 mg: 2000 mg | ORAL | @ 14:00:00

## 2020-03-31 NOTE — Unmapped (Signed)
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 Plan to start Azacitidine and Ventoclax for treatment of newly diagnosed AML when WBC are below 25k, likely tomorrow. Monitoring CBC and TLS labs Q12H. Continue IVF at 75 mL/hr, continue Allopurinol BID. Per ICID recs, continue Ceftriaxone and Azithromycin 5 day course. F/u urine, sputum studies as noted below. CT chest images from [facility] requested for radiology review at [facility]. Today pt continues to be afebrile and well-appearing, not producing sputum for sampling. Continue ppx Micafungin and Valtrex as well. Continue home Bicarb tabs for CKD, Timolol and Latanoprost Eye drops of glaucoma, and FloMax and self catherterization for Bladder muscle dysfunction. Added vitamin B12 supplement per patient request.     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 Initally treated with intermittent phlebotomy, ruxonlitinib and hydrea and most recently Fedratinib which he was stopped. He is followed locally by Dr. Stacey Drain [facility]). 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 Cytogenetics were normal (46, XY). He underwent another BMBx on 04/07/16 which revealed >95% cellular marrow with 5% blasts by manual aspirate differential, mild reticulin fibrosis, mild dyserythropoiesis with molecular mutations in:??JAK2 V617F, IDH2, RUNX1, SRSF2. He had labs done recently at [facility] which were concerning for acute leukemia (AML) based on falling hemoglobin and 26% blasts reportedly seen from peripheral blood. He underwent a BMBx on 02/28/20 which confirmed AML progression with 22% myeloblasts, unfortunately insufficient sample for flow. 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 Peripheral blood smear revealed 17% circulating myeloblasts with WBC 53.2k. He was started on Hydrea 500 mg BID in addition to allopurinol on 03/06/20. He discussed his treatment options with Dr. Anise Salvo with decision to focus on quality of life and pursue less intensive treatment with Aza/Ven. He was seen in clinic on 03/12/20 with WBC now 67k and his Hydrea was increased to 1000 mg BID with goal WBC <10k prior to treatment initiation. He requested his therapy be delayed ~2 weeks in order to spend time with family. On admission there is c/f fungal PNA and persistent hyperleukocytosis.  His WBC on admission was 55k.  - Hydrea 2000 mg Q8H  - Allopurinol 300 mg BID  - NS @ 75 mL/hr started 7/9  - Plan to start Azacitadine/Venetoclax once WBC less than 25  - TLS Labs and CBC Q12H  - Valtrex, Micafungin ppx    Recent CAP now c/f possible Fungal PNA: Symptoms include cough, SOB/DOE and decreased exercise intolerance. He recently completed a course of Augmentin and Azithromycin with brief resolution of symptoms. He was seen in clinic on 6/15 and reported ongoing cough. He underwent a CT Chest at [facility] 6/28 which revealed multifocal GGO, R >L as well as associated consolidative opacity  in RML with associated collapse as well as scattered tiny pulmonary nodules and bilateral trace pleural effusions. Incidental finding of massive splenomegaly with markedly increased size since prior CT A/P on 04/03/19. CT chest repeated at [facility] on 7/8 revealed multifocal RUL opacities; likely PNA. Small B pleural effusions. Splenomegaly and R hydronephrosis. ICID consulted on 7/8, recommendations below; signed off.   [ ]  CT chest read and formal comparison requested 7/9, f/u results  - Per ICID, will retreat for CAP with a 5-day course of Ceftriaxone and Azithromycin (7/9-7/13)  - Micafungin ppx as above  [ ]  sputum sample ordered (bacterial and fungal cx, AFB) - not currently producing sputum  [ ]  f/u results of: urine histo, serum crypto, serum fungitell  - Negative results: RPP/COVID, urine legionella  - Can consider bronchoscopy if no improvement 72 hrs post abx (~Tuesday)    Recent Epistaxis: Per chart review patient has history of nosebleeds requiring cauterization. Was recently seen by ENT with cautherization to right nasal passage.  - Afrin PRN and hold pressure for 20 seconds with any reoccurrence  - Consider increasing PLT threshold if difficult to control  - Vaseline and nasal saline for nasal dryness    Hx CKD: Baseline Cr appears to be around 1.5-1.8. Upon admission Cr 1.61   - Close monitoring for TLS as above  - Avoid nephrotoxic agents as able, Renally dose medications as indicated  - Continue Sodium Bicarb tabs BID  ??  Hx Crohn's Disease: Patient denies any abdominal pain, melena or BRBPR. He is not currently on any medical management.  - Monitor for symptom development  - Hold home multivitamins  ??  Bladder Muscle Dysfunction  - I/O Cath per patient's home regimen  - Continue FloMax  ??  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 Expected to become pancytopenic in coming days with initiation of Hydrea/chemotherapy   - Transfuse 1 unit of pRBCs for hgb >7  - Transfuse 1 unit plt for plts >10K     Nutrition:                        Subjective:   Afebrile. NAEON. Patient feeling well this morning with no complaints. Continues to stay active in the halls. Delighted to hear he will likely start chemotherapy tomorrow.     Objective:     Vital signs in last 24 hours:  Temp:  [36.5 ??C (97.7 ??F)-36.8 ??C (98.2 ??F)] 36.7 ??C (98.1 ??F)  Heart Rate:  [82-91] 88  Resp:  [22] 22  BP: (130-151)/(72-86) 149/83  MAP (mmHg):  [95-104] 104  SpO2:  [96 %-100 %] 97 %    Intake/Output last 3 shifts:  I/O last 3 completed shifts:  In: 350 [P.O.:350]  Out: 1150 [Urine:1150]    Meds:  Current Facility-Administered Medications   Medication Dose Route Frequency Provider Last Rate Last Admin   ??? allopurinoL (ZYLOPRIM) tablet 300 mg  300 mg Oral BID Herbert Moors, AGNP   300 mg at 03/30/20 2037   ??? azithromycin [facility]) tablet 250 mg  250 mg Oral Q24H [facility] [address] [place], [place]       ??? cefTRIAXone (ROCEPHIN) 1 g in sodium chloride 0.9 % (NS) 100 mL IVPB-connector bag  1 g Intravenous Q24H [address] [place], [place]   Stopped at 03/30/20 1305   ??? emollient combination no.92 (  LUBRIDERM) lotion 1 application  1 application Topical Q1H PRN Herbert Moors, AGNP       ??? finasteride (PROSCAR) tablet 5 mg  5 mg Oral Daily Herbert Moors, AGNP   5 mg at 03/30/20 6440   ??? hydroxyurea (HYDREA) capsule 2,000 mg  2,000 mg Oral [address] [place], AGNP   2,000 mg at 03/31/20 0206   ??? latanoprost (XALATAN) 0.005 % ophthalmic solution 1 drop  1 drop Both Eyes Nightly Herbert Moors, [place]   1 drop at 03/30/20 2133   ??? loperamide (IMODIUM) capsule 2 mg  2 mg Oral Q2H PRN Herbert Moors, AGNP       ??? loperamide (IMODIUM) capsule 4 mg  4 mg Oral Once PRN Herbert Moors, AGNP       ??? micafungin (MYCAMINE) 100 mg in sodium chloride (NS) 0.9 % 100 mL IVPB  100 mg Intravenous Q24H [facility] Herbert Moors, [place]   Stopped at 03/30/20 3474   ??? oxymetazoline (AFRIN) 0.05 % nasal spray 2 spray  2 spray Each Nare BID PRN Ernie Hew, AGNP       ??? sodium bicarbonate tablet 650 mg  650 mg Oral BID Herbert Moors, [place]   650 mg at 03/30/20 2037   ??? sodium chloride (NS) 0.9 % infusion  75 mL/hr Intravenous Continuous [address] [place], [place] 75 mL/hr at 03/31/20 0208 75 mL/hr at 03/31/20 0208   ??? sodium chloride (OCEAN) 0.65 % nasal spray 1 spray  1 spray Each Nare Q6H PRN Ernie Hew, AGNP       ??? timolol (TIMOPTIC) 0.5 % ophthalmic solution 1 drop  1 drop Both Eyes Daily Herbert Moors, AGNP   1 drop at 03/30/20 0849   ??? valACYclovir (VALTREX) tablet 500 mg  500 mg Oral Daily Herbert Moors, AGNP   500 mg at 03/30/20 2595   ??? white petrolatum (VASELINE) jelly   Topical BID Herbert Moors, [place]   Given at 03/30/20 6387       Physical Exam:  General: Well developed, well-nourished male in no acute distress.   Neuro: A&Ox4. Appropriate affect. Speech fluent. CNII-CNXII grossly intact. Gait steady.   HEENT: PER. Hearing intact to conversation. No scleral icterus or conjunctival injection. MMM.   Respiratory: Breathing is unlabored. Lungs CTAB. No rhonchi, wheezes, or crackles.    Cardiovascular: RRR.  S1, S2 present.  No m/r/g. Pulses 2+ and symmetric bilaterally.  Extremities well perfused.   GI: Soft, NTND abdomen. Active bowel sounds, splenomegaly. Otherwise, no palpable masses.  Musculoskeletal: No grossly-evident deformities. ROM grossly intact in all 4 extremities. No clubbing or cyanosis. 1+ BLE edema.   Skin: Warm, dry. No rashes, obvious lesions, excessive petechiae/purpura.   CVAD: None (PIV)    Labs:  Recent Labs     03/28/20  0941 03/29/20  0624 03/29/20  1332 03/30/20  0508 03/30/20  1447 03/31/20  0605   WBC 55.7* 42.6* 36.3* 35.5* 36.7*  --    NEUTROABS 30.1* 19.6* 18.2* 17.0* 20.2*  --    HGB 10.7* 10.2* 11.0* 10.1* 10.7* 10.5*   HCT 36.1* 34.9* 35.7* 33.9* 35.9* 34.2*   PLT 139* 132* 156 123* 144* 97*   CREATININE 1.61* 1.50* - 1.50*  --  1.60* - 1.60* 1.54* 1.46*   BUN 34* 33* - 33*  --  37* - 37* 38* 37*   BILITOT 0.8 0.7  --  0.5  --  0.6  BILIDIR  --   --   --   --   --  <0.10   AST 58* 50  --  49  --  48   ALT 11 9  --  8  --  8   ALKPHOS 94 93  --  91  --  95   K 4.8 4.2 - 4.2  --  4.0 - 4.0 4.4 4.3   MG 2.1 1.9 1.8 1.8  --  1.9   CALCIUM 8.3* 8.2* - 8.2*  --  8.0* - 8.0* 8.0* 8.2*   NA 138 134* - 134*  --  135 - 135 135 134*   CL 106 106 - 106  --  105 - 105 105 108*   CO2 26.0 22.0 - 22.0  --  22.0 - 22.0 20.0* 21.0*   PHOS 4.5 4.7 5.0* 4.8* 5.4* 5.0*   INR 1.21  --   --   --   --   --        Imaging:  Reviewed per EMR.    Hinda Glatter, PA-C   Hematology/Oncology Department   [facility]   Pager: [phone]      03/31/20

## 2020-03-31 NOTE — Unmapped (Signed)
Patient is alert and oriented. VSS, afebrile. Has persistent dry cough, no respiratory distress. Denies any pain. IV fluid in progress. Patient do self catheterization. Needs attended.   Problem: Adult Inpatient Plan of Care  Goal: Plan of Care Review  Outcome: Progressing  Goal: Patient-Specific Goal (Individualization)  Outcome: Progressing  Goal: Absence of Hospital-Acquired Illness or Injury  Outcome: Progressing  Goal: Optimal Comfort and Wellbeing  Outcome: Progressing  Goal: Readiness for Transition of Care  Outcome: Progressing  Goal: Rounds/Family Conference  Outcome: Progressing     Problem: Infection  Goal: Infection Symptom Resolution  Outcome: Progressing

## 2020-03-31 NOTE — Unmapped (Signed)
AxO x 4. VSS, remained afebrile and free from falls. No complaints of pain or nausea. One episode of epistaxis, MD notified - PRN nasal spray ordered. No further episodes of bleeding. Wife at bedside this afternoon. NS at 49mL/hr. Will continue to monitor.    Problem: Adult Inpatient Plan of Care  Goal: Plan of Care Review  Outcome: Ongoing - Unchanged  Goal: Patient-Specific Goal (Individualization)  Outcome: Ongoing - Unchanged  Goal: Absence of Hospital-Acquired Illness or Injury  Outcome: Ongoing - Unchanged  Goal: Optimal Comfort and Wellbeing  Outcome: Ongoing - Unchanged  Goal: Readiness for Transition of Care  Outcome: Ongoing - Unchanged  Goal: Rounds/Family Conference  Outcome: Ongoing - Unchanged     Problem: Infection  Goal: Infection Symptom Resolution  Outcome: Ongoing - Unchanged

## 2020-04-01 DIAGNOSIS — D630 Anemia in neoplastic disease: Secondary | ICD-10-CM | POA: Diagnosis not present

## 2020-04-01 DIAGNOSIS — D6959 Other secondary thrombocytopenia: Secondary | ICD-10-CM | POA: Diagnosis not present

## 2020-04-01 DIAGNOSIS — R040 Epistaxis: Secondary | ICD-10-CM | POA: Insufficient documentation

## 2020-04-01 DIAGNOSIS — C9200 Acute myeloblastic leukemia, not having achieved remission: Secondary | ICD-10-CM | POA: Diagnosis not present

## 2020-04-01 DIAGNOSIS — Z5111 Encounter for antineoplastic chemotherapy: Secondary | ICD-10-CM | POA: Diagnosis not present

## 2020-04-01 LAB — MANUAL DIFFERENTIAL
BASOPHILS - ABS (DIFF): 0.5 10*9/L — ABNORMAL HIGH (ref 0.0–0.1)
BASOPHILS - ABS (DIFF): 0.7 10*9/L — ABNORMAL HIGH (ref 0.0–0.1)
BASOPHILS - REL (DIFF): 3 %
BLASTS - REL (DIFF): 22 % (ref <=0)
BLASTS - REL (DIFF): 22 % (ref <=0)
EOSINOPHILS - ABS (DIFF): 0.2 10*9/L (ref 0.0–0.4)
LYMPHOCYTES - ABS (DIFF): 2.1 10*9/L (ref 1.5–5.0)
LYMPHOCYTES - ABS (DIFF): 1.9 10*9/L (ref 1.5–5.0)
LYMPHOCYTES - REL (DIFF): 9 %
LYMPHOCYTES - REL (DIFF): 8 %
NEUTROPHILS - ABS (DIFF): 15.5 10*9/L — ABNORMAL HIGH (ref 2.0–7.5)
NEUTROPHILS - ABS (DIFF): 15.9 10*9/L — ABNORMAL HIGH (ref 2.0–7.5)
NEUTROPHILS - REL (DIFF): 66 %
NEUTROPHILS - REL (DIFF): 67 %

## 2020-04-01 LAB — CBC W/ AUTO DIFF
BASOPHILS ABSOLUTE COUNT: 0.7 10*9/L — ABNORMAL HIGH (ref 0.0–0.1)
BASOPHILS RELATIVE PERCENT: 3 %
EOSINOPHILS ABSOLUTE COUNT: 0.1 10*9/L (ref 0.0–0.4)
EOSINOPHILS RELATIVE PERCENT: 0.4 %
HEMATOCRIT: 35.1 % — ABNORMAL LOW (ref 41.0–53.0)
HEMATOCRIT: 37.9 % — ABNORMAL LOW (ref 41.0–53.0)
HEMOGLOBIN: 10.6 g/dL — ABNORMAL LOW (ref 13.5–17.5)
LARGE UNSTAINED CELLS: 20 % — ABNORMAL HIGH (ref 0–4)
LYMPHOCYTES ABSOLUTE COUNT: 4.2 10*9/L (ref 1.5–5.0)
LYMPHOCYTES RELATIVE PERCENT: 16.8 %
MEAN CORPUSCULAR HEMOGLOBIN CONC: 29.8 g/dL — ABNORMAL LOW (ref 31.0–37.0)
MEAN CORPUSCULAR HEMOGLOBIN: 27.9 pg (ref 26.0–34.0)
MEAN CORPUSCULAR HEMOGLOBIN: 27.8 pg (ref 26.0–34.0)
MEAN CORPUSCULAR VOLUME: 92.2 fL (ref 80.0–100.0)
MEAN CORPUSCULAR VOLUME: 93.3 fL (ref 80.0–100.0)
MEAN CORPUSCULAR VOLUME: 93.3 fL (ref 80.0–100.0)
MEAN PLATELET VOLUME: 12.1 fL — ABNORMAL HIGH (ref 7.0–10.0)
MEAN PLATELET VOLUME: 12 fL — ABNORMAL HIGH (ref 7.0–10.0)
MEAN PLATELET VOLUME: 12.1 fL — ABNORMAL HIGH (ref 7.0–10.0)
MONOCYTES ABSOLUTE COUNT: 0.6 10*9/L (ref 0.2–0.8)
MONOCYTES RELATIVE PERCENT: 2.6 %
NEUTROPHILS RELATIVE PERCENT: 57 %
NUCLEATED RED BLOOD CELLS: 4 /100{WBCs} (ref <=4)
NUCLEATED RED BLOOD CELLS: 3 /100{WBCs} (ref <=4)
NUCLEATED RED BLOOD CELLS: 2 /100{WBCs} (ref <=4)
PLATELET COUNT: 94 10*9/L — ABNORMAL LOW (ref 150–440)
PLATELET COUNT: 101 10*9/L — ABNORMAL LOW (ref 150–440)
PLATELET COUNT: 94 10*9/L — ABNORMAL LOW (ref 150–440)
RED BLOOD CELL COUNT: 3.81 10*12/L — ABNORMAL LOW (ref 4.50–5.90)
RED BLOOD CELL COUNT: 4.14 10*12/L — ABNORMAL LOW (ref 4.50–5.90)
RED BLOOD CELL COUNT: 4.06 10*12/L — ABNORMAL LOW (ref 4.50–5.90)
RED CELL DISTRIBUTION WIDTH: 23.2 % — ABNORMAL HIGH (ref 12.0–15.0)
RED CELL DISTRIBUTION WIDTH: 23.1 % — ABNORMAL HIGH (ref 12.0–15.0)
RED CELL DISTRIBUTION WIDTH: 23.3 % — ABNORMAL HIGH (ref 12.0–15.0)
WBC ADJUSTED: 24.7 10*9/L — ABNORMAL HIGH (ref 4.5–11.0)
WBC ADJUSTED: 23.7 10*9/L — ABNORMAL HIGH (ref 4.5–11.0)

## 2020-04-01 LAB — COMPREHENSIVE METABOLIC PANEL
ALBUMIN: 3.4 g/dL — ABNORMAL LOW (ref 3.5–5.0)
ALKALINE PHOSPHATASE: 107 U/L (ref 38–126)
ALT (SGPT): 10 U/L (ref <50)
ANION GAP: 7 mmol/L (ref 7–15)
BILIRUBIN TOTAL: 0.6 mg/dL (ref 0.0–1.2)
BLOOD UREA NITROGEN: 37 mg/dL — ABNORMAL HIGH (ref 7–21)
BUN / CREAT RATIO: 28
CALCIUM: 8.3 mg/dL — ABNORMAL LOW (ref 8.5–10.2)
CHLORIDE: 108 mmol/L — ABNORMAL HIGH (ref 98–107)
CO2: 19 mmol/L — ABNORMAL LOW (ref 22.0–30.0)
CREATININE: 1.32 mg/dL — ABNORMAL HIGH (ref 0.70–1.30)
EGFR CKD-EPI AA MALE: 61 mL/min/{1.73_m2} (ref >=60)
EGFR CKD-EPI NON-AA MALE: 53 mL/min/{1.73_m2} — ABNORMAL LOW (ref >=60)
GLUCOSE RANDOM: 108 mg/dL (ref 70–179)
PROTEIN TOTAL: 8.5 g/dL — ABNORMAL HIGH (ref 6.5–8.3)
SODIUM: 134 mmol/L — ABNORMAL LOW (ref 135–145)

## 2020-04-01 LAB — BASIC METABOLIC PANEL
ANION GAP: 7 mmol/L (ref 7–15)
BLOOD UREA NITROGEN: 34 mg/dL — ABNORMAL HIGH (ref 7–21)
BLOOD UREA NITROGEN: 37 mg/dL — ABNORMAL HIGH (ref 7–21)
BUN / CREAT RATIO: 26
BUN / CREAT RATIO: 29
CALCIUM: 8 mg/dL — ABNORMAL LOW (ref 8.5–10.2)
CALCIUM: 8.2 mg/dL — ABNORMAL LOW (ref 8.5–10.2)
CHLORIDE: 111 mmol/L — ABNORMAL HIGH (ref 98–107)
CHLORIDE: 106 mmol/L (ref 98–107)
CO2: 19 mmol/L — ABNORMAL LOW (ref 22.0–30.0)
CO2: 18 mmol/L — ABNORMAL LOW (ref 22.0–30.0)
CREATININE: 1.32 mg/dL — ABNORMAL HIGH (ref 0.70–1.30)
CREATININE: 1.28 mg/dL (ref 0.70–1.30)
EGFR CKD-EPI AA MALE: 61 mL/min/{1.73_m2} (ref >=60)
EGFR CKD-EPI AA MALE: 64 mL/min/{1.73_m2} (ref >=60)
EGFR CKD-EPI NON-AA MALE: 53 mL/min/{1.73_m2} — ABNORMAL LOW (ref >=60)
GLUCOSE RANDOM: 85 mg/dL (ref 70–179)
GLUCOSE RANDOM: 104 mg/dL (ref 70–179)
POTASSIUM: 4.4 mmol/L (ref 3.5–5.0)
POTASSIUM: 4.4 mmol/L (ref 3.5–5.0)
SODIUM: 132 mmol/L — ABNORMAL LOW (ref 135–145)

## 2020-04-01 LAB — RED BLOOD CELL COUNT: Lab: 3.81 — ABNORMAL LOW

## 2020-04-01 LAB — GLUCOSE RANDOM: Glucose:MCnc:Pt:Ser/Plas:Qn:: 85

## 2020-04-01 LAB — URIC ACID
Urate:MCnc:Pt:Ser/Plas:Qn:: 4.2
Urate:MCnc:Pt:Ser/Plas:Qn:: 4.3
Urate:MCnc:Pt:Ser/Plas:Qn:: 5

## 2020-04-01 LAB — PHOSPHORUS
Phosphate:MCnc:Pt:Ser/Plas:Qn:: 4.4
Phosphate:MCnc:Pt:Ser/Plas:Qn:: 4.9 — ABNORMAL HIGH
Phosphate:MCnc:Pt:Ser/Plas:Qn:: 5.2 — ABNORMAL HIGH

## 2020-04-01 LAB — EOSINOPHILS - ABS (DIFF): Eosinophils:NCnc:Pt:Bld:Qn:: 0

## 2020-04-01 LAB — HYPOCHROMIA

## 2020-04-01 LAB — HEPATIC FUNCTION PANEL
ALBUMIN: 2.9 g/dL — ABNORMAL LOW (ref 3.5–5.0)
ALKALINE PHOSPHATASE: 95 U/L (ref 38–126)
ALT (SGPT): 8 U/L (ref <50)
AST (SGOT): 48 U/L (ref 19–55)
BILIRUBIN DIRECT: <0.1 mg/dL (ref 0.00–0.40)

## 2020-04-01 LAB — HISTOPLASMA ANTIGEN, URINE: HISTOPLASMA AG, URINE VALUE: 0 ng/mL

## 2020-04-01 LAB — SMEAR REVIEW

## 2020-04-01 LAB — MAGNESIUM
Magnesium:MCnc:Pt:Ser/Plas:Qn:: 1.8
Magnesium:MCnc:Pt:Ser/Plas:Qn:: 1.9

## 2020-04-01 LAB — SODIUM: Sodium:SCnc:Pt:Ser/Plas:Qn:: 132 — ABNORMAL LOW

## 2020-04-01 LAB — BILIRUBIN TOTAL: Bilirubin:MCnc:Pt:Ser/Plas:Qn:: 0.6

## 2020-04-01 LAB — HISTOPLASMA AG, URINE RESULT: Histoplasma capsulatum Ag:PrThr:Pt:Urine:Ord:IA: NEGATIVE

## 2020-04-01 LAB — LACTATE DEHYDROGENASE
Lactate dehydrogenase:CCnc:Pt:Ser/Plas:Qn:Reaction: pyruvate to lactate: 1690 — ABNORMAL HIGH
Lactate dehydrogenase:CCnc:Pt:Ser/Plas:Qn:Reaction: pyruvate to lactate: 1962 — ABNORMAL HIGH

## 2020-04-01 LAB — POLYCHROMASIA

## 2020-04-01 LAB — BILIRUBIN DIRECT: Bilirubin.glucuronidated+Bilirubin.albumin bound:MCnc:Pt:Ser/Plas:Qn:: <0.1

## 2020-04-01 LAB — FUNGITELL QUALITATIVE: Lab: NEGATIVE

## 2020-04-01 LAB — MEAN CORPUSCULAR HEMOGLOBIN CONC: Erythrocyte mean corpuscular hemoglobin concentration:MCnc:Pt:RBC:Qn:Automated count: 29 — ABNORMAL LOW

## 2020-04-01 MED ADMIN — cefTRIAXone (ROCEPHIN) 1 g in sodium chloride 0.9 % (NS) 100 mL IVPB-connector bag: 1 g | INTRAVENOUS | @ 15:00:00 | Stop: 2020-04-04

## 2020-04-01 MED ADMIN — timolol (TIMOPTIC) 0.5 % ophthalmic solution 1 drop: 1 [drp] | OPHTHALMIC | @ 13:00:00

## 2020-04-01 MED ADMIN — valACYclovir (VALTREX) tablet 500 mg: 500 mg | ORAL | @ 13:00:00

## 2020-04-01 MED ADMIN — lactated Ringers infusion: 125 mL/h | INTRAVENOUS | @ 16:00:00

## 2020-04-01 MED ADMIN — venetoclax (VENCLEXTA) tablet 100 mg: 100 mg | ORAL | @ 22:00:00 | Stop: 2020-04-01

## 2020-04-01 MED ADMIN — white petrolatum (VASELINE) jelly: TOPICAL | @ 13:00:00

## 2020-04-01 MED ADMIN — white petrolatum (VASELINE) jelly: TOPICAL | @ 01:00:00

## 2020-04-01 MED ADMIN — finasteride (PROSCAR) tablet 5 mg: 5 mg | ORAL | @ 13:00:00

## 2020-04-01 MED ADMIN — hydroxyurea (HYDREA) capsule 2,000 mg: 2000 mg | ORAL | @ 06:00:00 | Stop: 2020-04-01

## 2020-04-01 MED ADMIN — allopurinoL (ZYLOPRIM) tablet 300 mg: 300 mg | ORAL | @ 01:00:00

## 2020-04-01 MED ADMIN — allopurinoL (ZYLOPRIM) tablet 300 mg: 300 mg | ORAL | @ 13:00:00

## 2020-04-01 NOTE — Unmapped (Signed)
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 WBC 23k upon initiation of chemo. Stopped Hydrea. Monitoring CBC and TLS labs Q12H. Increased IVF to 125 mL/hr, continue Allopurinol BID. Continue ppx Micafungin and Valtrex. Plan to switch to Posaconazole per protocol on D8. Per ICID recs, continue Ceftriaxone and Azithromycin 5 day course. F/u urine, sputum studies as noted below. CT chest images from [facility] requested for radiology review at [facility] on 7/9. Continue home Bicarb tabs for CKD, Timolol and Latanoprost Eye drops of glaucoma, and FloMax and self catherterization for Bladder muscle dysfunction. Added vitamin B12 supplement per patient request.     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 Initally treated with intermittent phlebotomy, ruxonlitinib and hydrea and most recently Fedratinib which he was stopped. He is followed locally by Dr. Stacey Drain [facility]). 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 Cytogenetics were normal (46, XY). He underwent another BMBx on 04/07/16 which revealed >95% cellular marrow with 5% blasts by manual aspirate differential, mild reticulin fibrosis, mild dyserythropoiesis with molecular mutations in:??JAK2 V617F, IDH2, RUNX1, SRSF2. He had labs done recently at [facility] which were concerning for acute leukemia (AML) based on falling hemoglobin and 26% blasts reportedly seen from peripheral blood. He underwent a BMBx on 02/28/20 which confirmed AML progression with 22% myeloblasts, unfortunately insufficient sample for flow. 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 Peripheral blood smear revealed 17% circulating myeloblasts with WBC 53.2k. He was started on Hydrea 500 mg BID in addition to allopurinol on 03/06/20. He discussed his treatment options with Dr. Anise Salvo with decision to focus on quality of life and pursue less intensive treatment with Aza/Ven. He was seen in clinic on 03/12/20 with WBC now 67k and his Hydrea was increased to 1000 mg BID with goal WBC <10k prior to treatment initiation. He requested his therapy be delayed ~2 weeks in order to spend time with family. On admission there is c/f fungal PNA and persistent hyperleukocytosis.  His WBC on admission was 55k. He started C1 Azacitidine + Venetoclax on 7/11 with a starting WBC at 23k.   - D1 = 7/11 Aza+Ven   - Allopurinol 300 mg BID  - LR @ 125 mL/hr   - TLS Labs and CBC Q12H  - Valtrex, Micafungin ppx  [ ]  Plan to transition to Posaconazole on D8     Disposition:   - Outpatient team working on follow up, see multiple in-baskets     Recent CAP now c/f possible Fungal PNA: Symptoms include cough, SOB/DOE and decreased exercise intolerance. He recently completed a course of Augmentin and Azithromycin with brief resolution of symptoms. He was seen in clinic on 6/15 and reported ongoing cough. He underwent a [facility] Chest [facility]  6/28 which revealed multifocal GGO, R >L as well as associated consolidative opacity in RML with associated collapse as well as scattered tiny pulmonary nodules and bilateral trace pleural effusions. Incidental finding of massive splenomegaly with markedly increased size since prior CT A/P on 04/03/19. CT chest repeated at [facility] on 7/8 revealed multifocal RUL opacities; likely PNA. Small B pleural effusions. Splenomegaly and R hydronephrosis. ICID consulted on 7/8, recommendations below; signed off.   [ ]  CT chest read and formal comparison requested 7/9, f/u results  - Per ICID, will retreat for CAP with a 5-day course of Ceftriaxone and Azithromycin (7/9-7/13)  - Micafungin ppx as above  [ ]  sputum sample ordered (bacterial and fungal cx, AFB) - not currently producing sputum  [ ]  f/u results of: urine histo, Aspergillus galactomannan Ag   - Negative results: RPP/COVID, urine legionella, Serum Fungitell, Serum Cyrpto     Recent Epistaxis: Per chart review patient has history of nosebleeds requiring cauterization. Was recently seen by ENT with cautherization to right nasal passage.  - Afrin PRN and hold pressure for 20 seconds with any reoccurrence  - Consider increasing PLT threshold if difficult to control  - Vaseline and nasal saline for nasal dryness    Hx CKD: Baseline Cr appears to be around 1.5-1.8. Upon admission Cr 1.61. Cr below baseline with adequate hydration.    - Close monitoring for TLS as above  - Avoid nephrotoxic agents as able, Renally dose medications as indicated  - Continue Sodium Bicarb tabs BID  ??  Hx Crohn's Disease: Patient denies any abdominal pain, melena or BRBPR. He is not currently on any medical management.  - Monitor for symptom development  - Hold home multivitamins  ??  Bladder Muscle Dysfunction  - I/O Cath per patient's home regimen  - Continue FloMax  ??  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 Expected to become pancytopenic in coming days with initiation of Hydrea/chemotherapy   - Transfuse 1 unit of pRBCs for hgb >7  - Transfuse 1 unit plt for plts >10K     Nutrition:                        Subjective:   Afebrile. NAEON. Patient is feeling a little more tired today. However he continues to stay active in the halls.     Objective:     Vital signs in last 24 hours:  Temp:  [36.4 ??C (97.5 ??F)-36.8 ??C (98.2 ??F)] 36.6 ??C (97.9 ??F)  Heart Rate:  [76-94] 94  Resp:  [16-20] 16  BP: (133-168)/(71-91) 144/82  MAP (mmHg):  [91-114] 114  SpO2:  [96 %-100 %] 96 %    Intake/Output last 3 shifts:  I/O last 3 completed shifts:  In: 400 [P.O.:400]  Out: 1900 [Urine:1900]    Meds:  Current Facility-Administered Medications   Medication Dose Route Frequency Provider Last Rate Last Admin   ??? allopurinoL (ZYLOPRIM) tablet 300 mg  300 mg Oral BID Herbert Moors, AGNP   300 mg at 03/31/20 2044   ??? azithromycin (ZITHROMAX) tablet 250 mg  250 mg Oral Q24H [facility] [address] [place], [place]   250 mg at 03/31/20 1610   ??? cefTRIAXone (ROCEPHIN) 1 g in sodium chloride 0.9 % (NS) 100 mL IVPB-connector bag  1 g Intravenous Q24H [address], AGNP   Stopped at  03/31/20 1150   ??? cyanocobalamin (vitamin B-12) tablet 1,000 mcg  1,000 mcg Oral Daily [address] Remell Gia[place], [place]   1,000 mcg at 03/31/20 1544   ??? emollient combination no.92 (LUBRIDERM) lotion 1 application  1 application Topical Q1H PRN Herbert Moors, AGNP       ??? finasteride (PROSCAR) tablet 5 mg  5 mg Oral Daily Herbert Moors, AGNP   5 mg at 03/31/20 1610   ??? hydrALAZINE (APRESOLINE) injection 10 mg  10 mg Intravenous Once Dillard Essex, FNP       ??? hydroxyurea (HYDREA) capsule 2,000 mg  2,000 mg Oral [address] [place], AGNP   2,000 mg at 04/01/20 0224   ??? latanoprost (XALATAN) 0.005 % ophthalmic solution 1 drop  1 drop Both Eyes Nightly Herbert Moors, AGNP   1 drop at 03/31/20 2045   ??? loperamide (IMODIUM) capsule 2 mg  2 mg Oral Q2H PRN Herbert Moors, AGNP       ??? loperamide (IMODIUM) capsule 4 mg  4 mg Oral Once PRN Herbert Moors, AGNP       ??? micafungin (MYCAMINE) 100 mg in sodium chloride (NS) 0.9 % 100 mL IVPB  100 mg Intravenous Q24H [facility] Herbert Moors, [place]   Stopped at 03/31/20 0940   ??? oxymetazoline (AFRIN) 0.05 % nasal spray 2 spray  2 spray Each Nare BID PRN Army Chaco Vogler, AGNP   2 spray at 03/31/20 9604   ??? sodium bicarbonate tablet 650 mg  650 mg Oral BID Herbert Moors, [place]   650 mg at 03/31/20 2044   ??? sodium chloride (NS) 0.9 % infusion  75 mL/hr Intravenous Continuous [address] [place], AGNP 75 mL/hr at 03/31/20 1816 75 mL/hr at 03/31/20 1816   ??? sodium chloride (OCEAN) 0.65 % nasal spray 1 spray  1 spray Each Nare Q6H PRN Ernie Hew, AGNP       ??? timolol (TIMOPTIC) 0.5 % ophthalmic solution 1 drop  1 drop Both Eyes Daily Herbert Moors, AGNP   1 drop at 03/31/20 5409   ??? valACYclovir (VALTREX) tablet 500 mg  500 mg Oral Daily Herbert Moors, AGNP   500 mg at 03/31/20 8119   ??? white petrolatum (VASELINE) jelly   Topical BID Herbert Moors, [place]   Given at 03/31/20 2046       Physical Exam:  General: Well developed, well-nourished male in no acute distress.   Neuro: A&Ox4. Appropriate affect. Speech fluent. CNII-CNXII grossly intact. Gait steady.   HEENT: PER. Hearing intact to conversation. No scleral icterus or conjunctival injection. MMM.   Respiratory: Breathing is unlabored. Lungs CTAB. No rhonchi, wheezes, or crackles.    Cardiovascular: RRR.  S1, S2 present.  No m/r/g. Pulses 2+ and symmetric bilaterally.  Extremities well perfused.   GI: Soft, NTND abdomen.   Musculoskeletal: No grossly-evident deformities. ROM grossly intact in all 4 extremities. No clubbing or cyanosis. 1+ BLE edema.   Skin: Warm, dry. No rashes, obvious lesions, excessive petechiae/purpura.   CVAD: None (PIV)    Labs:  Recent Labs     03/30/20  0508 03/30/20  1447 03/31/20  0605 03/31/20  1653 04/01/20  0600 04/01/20  0601   WBC 35.5*   < > 28.7* 29.5* 23.5*  --    NEUTROABS 17.0*   < > 21.0* 17.7* 15.5*  --    HGB 10.1*   < > 10.5* 11.4* 10.6*  --  HCT 33.9*   < > 34.2* 38.6* 35.1*  --    PLT 123*   < > 97* 119* 94*  --    CREATININE 1.60* - 1.60*   < > 1.46* 1.35*  --  1.32* BUN 37* - 37*   < > 37* 38*  --  34*   BILITOT 0.5  --  0.6  --   --  0.6   BILIDIR  --   --  <0.10  --   --  <0.10   AST 49  --  48  --   --  48   ALT 8  --  8  --   --  8   ALKPHOS 91  --  95  --   --  95   K 4.0 - 4.0   < > 4.3 4.7  --  4.4   MG 1.8  --  1.9  --   --  1.8   CALCIUM 8.0* - 8.0*   < > 8.2* 8.2*  --  8.0*   NA 135 - 135   < > 134* 139  --  137   CL 105 - 105   < > 108* 109*  --  111*   CO2 22.0 - 22.0   < > 21.0* 21.0*  --  19.0*   PHOS 4.8*   < > 5.0* 5.1*  --  4.4    < > = values in this interval not displayed.       Imaging:  Reviewed per EMR.    Hinda Glatter, PA-C   Hematology/Oncology Department   [facility]   Pager: [phone]      04/01/20

## 2020-04-01 NOTE — Unmapped (Signed)
AxO x 4. VSS, remained afebrile and free from falls. No complaints of pain or nausea. PRN nasal spray given for scant bleeding. No further episodes of bleeding. Wife at bedside this afternoon. NS at 55mL/hr. Will continue to monitor.    Problem: Adult Inpatient Plan of Care  Goal: Plan of Care Review  Outcome: Ongoing - Unchanged  Goal: Patient-Specific Goal (Individualization)  Outcome: Ongoing - Unchanged  Goal: Absence of Hospital-Acquired Illness or Injury  Outcome: Ongoing - Unchanged  Goal: Optimal Comfort and Wellbeing  Outcome: Ongoing - Unchanged  Goal: Readiness for Transition of Care  Outcome: Ongoing - Unchanged  Goal: Rounds/Family Conference  Outcome: Ongoing - Unchanged     Problem: Infection  Goal: Infection Symptom Resolution  Outcome: Ongoing - Unchanged

## 2020-04-02 DIAGNOSIS — R197 Diarrhea, unspecified: Secondary | ICD-10-CM | POA: Diagnosis not present

## 2020-04-02 DIAGNOSIS — C9200 Acute myeloblastic leukemia, not having achieved remission: Secondary | ICD-10-CM | POA: Diagnosis not present

## 2020-04-02 DIAGNOSIS — R05 Cough: Secondary | ICD-10-CM | POA: Diagnosis not present

## 2020-04-02 DIAGNOSIS — Z5111 Encounter for antineoplastic chemotherapy: Secondary | ICD-10-CM | POA: Diagnosis not present

## 2020-04-02 DIAGNOSIS — D6959 Other secondary thrombocytopenia: Secondary | ICD-10-CM | POA: Diagnosis not present

## 2020-04-02 DIAGNOSIS — R918 Other nonspecific abnormal finding of lung field: Secondary | ICD-10-CM | POA: Diagnosis not present

## 2020-04-02 LAB — CBC W/ AUTO DIFF
HEMATOCRIT: 34.8 % — ABNORMAL LOW (ref 41.0–53.0)
HEMOGLOBIN: 10.7 g/dL — ABNORMAL LOW (ref 13.5–17.5)
MEAN CORPUSCULAR HEMOGLOBIN CONC: 30.8 g/dL — ABNORMAL LOW (ref 31.0–37.0)
MEAN CORPUSCULAR HEMOGLOBIN: 28.5 pg (ref 26.0–34.0)
MEAN CORPUSCULAR VOLUME: 92.4 fL (ref 80.0–100.0)
PLATELET COUNT: 71 10*9/L — ABNORMAL LOW (ref 150–440)
RED BLOOD CELL COUNT: 3.77 10*12/L — ABNORMAL LOW (ref 4.50–5.90)
RED CELL DISTRIBUTION WIDTH: 23.2 % — ABNORMAL HIGH (ref 12.0–15.0)
WBC ADJUSTED: 10.9 10*9/L (ref 4.5–11.0)

## 2020-04-02 LAB — BASIC METABOLIC PANEL
ANION GAP: 9 mmol/L (ref 7–15)
ANION GAP: 6 mmol/L — ABNORMAL LOW (ref 7–15)
BLOOD UREA NITROGEN: 46 mg/dL — ABNORMAL HIGH (ref 7–21)
BLOOD UREA NITROGEN: 50 mg/dL — ABNORMAL HIGH (ref 7–21)
BUN / CREAT RATIO: 36
CALCIUM: 8 mg/dL — ABNORMAL LOW (ref 8.5–10.2)
CALCIUM: 7.8 mg/dL — ABNORMAL LOW (ref 8.5–10.2)
CALCIUM: 7.7 mg/dL — ABNORMAL LOW (ref 8.5–10.2)
CHLORIDE: 109 mmol/L — ABNORMAL HIGH (ref 98–107)
CHLORIDE: 106 mmol/L (ref 98–107)
CHLORIDE: 106 mmol/L (ref 98–107)
CO2: 16 mmol/L — ABNORMAL LOW (ref 22.0–30.0)
CO2: 20 mmol/L — ABNORMAL LOW (ref 22.0–30.0)
CO2: 14 mmol/L — ABNORMAL LOW (ref 22.0–30.0)
CREATININE: 1.3 mg/dL (ref 0.70–1.30)
CREATININE: 1.38 mg/dL — ABNORMAL HIGH (ref 0.70–1.30)
CREATININE: 1.38 mg/dL — ABNORMAL HIGH (ref 0.70–1.30)
EGFR CKD-EPI AA MALE: 63 mL/min/{1.73_m2} (ref >=60)
EGFR CKD-EPI AA MALE: 58 mL/min/{1.73_m2} — ABNORMAL LOW (ref >=60)
EGFR CKD-EPI AA MALE: 58 mL/min/{1.73_m2} — ABNORMAL LOW (ref >=60)
EGFR CKD-EPI NON-AA MALE: 54 mL/min/{1.73_m2} — ABNORMAL LOW (ref >=60)
EGFR CKD-EPI NON-AA MALE: 50 mL/min/{1.73_m2} — ABNORMAL LOW (ref >=60)
EGFR CKD-EPI NON-AA MALE: 50 mL/min/{1.73_m2} — ABNORMAL LOW (ref >=60)
GLUCOSE RANDOM: 95 mg/dL (ref 70–179)
GLUCOSE RANDOM: 100 mg/dL (ref 70–179)
GLUCOSE RANDOM: 95 mg/dL (ref 70–179)
POTASSIUM: 5.2 mmol/L — ABNORMAL HIGH (ref 3.5–5.0)
POTASSIUM: 5.3 mmol/L — ABNORMAL HIGH (ref 3.5–5.0)
POTASSIUM: 4.9 mmol/L (ref 3.5–5.0)
SODIUM: 134 mmol/L — ABNORMAL LOW (ref 135–145)
SODIUM: 132 mmol/L — ABNORMAL LOW (ref 135–145)
SODIUM: 133 mmol/L — ABNORMAL LOW (ref 135–145)

## 2020-04-02 LAB — MANUAL DIFFERENTIAL
BASOPHILS - ABS (DIFF): 0.1 10*9/L (ref 0.0–0.1)
BASOPHILS - REL (DIFF): 1 %
BLASTS - REL (DIFF): 17 % (ref <=0)
EOSINOPHILS - ABS (DIFF): 0.1 10*9/L (ref 0.0–0.4)
EOSINOPHILS - REL (DIFF): 1 %
LYMPHOCYTES - REL (DIFF): 6 %
MONOCYTES - ABS (DIFF): 0.1 10*9/L — ABNORMAL LOW (ref 0.2–0.8)
MONOCYTES - REL (DIFF): 1 %
NEUTROPHILS - REL (DIFF): 74 %

## 2020-04-02 LAB — PHOSPHORUS
Phosphate:MCnc:Pt:Ser/Plas:Qn:: 7.1 — ABNORMAL HIGH
Phosphate:MCnc:Pt:Ser/Plas:Qn:: 7.2 — ABNORMAL HIGH
Phosphate:MCnc:Pt:Ser/Plas:Qn:: 7.3 — ABNORMAL HIGH

## 2020-04-02 LAB — URIC ACID
Urate:MCnc:Pt:Ser/Plas:Qn:: 4
Urate:MCnc:Pt:Ser/Plas:Qn:: 4
Urate:MCnc:Pt:Ser/Plas:Qn:: 4.4

## 2020-04-02 LAB — HEPATIC FUNCTION PANEL
ALBUMIN: 2.9 g/dL — ABNORMAL LOW (ref 3.5–5.0)
ALT (SGPT): 8 U/L (ref <50)
AST (SGOT): 53 U/L (ref 19–55)
BILIRUBIN TOTAL: 0.9 mg/dL (ref 0.0–1.2)
PROTEIN TOTAL: 7.2 g/dL (ref 6.5–8.3)

## 2020-04-02 LAB — VARIABLE HEMOGLOBIN CONCENTRATION

## 2020-04-02 LAB — PROTEIN TOTAL: Protein:MCnc:Pt:Ser/Plas:Qn:: 7.2

## 2020-04-02 LAB — EGFR CKD-EPI AA MALE: Glomerular filtration rate/1.73 sq M.predicted.black:ArVRat:Pt:Ser/Plas/Bld:Qn:Creatinine-based formula (CKD-EPI): 63

## 2020-04-02 LAB — LACTATE DEHYDROGENASE: Lactate dehydrogenase:CCnc:Pt:Ser/Plas:Qn:Reaction: pyruvate to lactate: 2075 — ABNORMAL HIGH

## 2020-04-02 LAB — MONOCYTES - REL (DIFF): Monocytes/100 leukocytes:NFr:Pt:Bld:Qn:Manual count: 1

## 2020-04-02 LAB — BLOOD UREA NITROGEN: Urea nitrogen:MCnc:Pt:Ser/Plas:Qn:: 50 — ABNORMAL HIGH

## 2020-04-02 LAB — MAGNESIUM: Magnesium:MCnc:Pt:Ser/Plas:Qn:: 1.9

## 2020-04-02 LAB — ANION GAP: Anion gap 3:SCnc:Pt:Ser/Plas:Qn:: 6 — ABNORMAL LOW

## 2020-04-02 MED ORDER — POSACONAZOLE 100 MG TABLET,DELAYED RELEASE
ORAL_TABLET | Freq: Every day | ORAL | 1 refills | 30.00000 days | Status: CP
Start: 2020-04-02 — End: ?

## 2020-04-02 MED ADMIN — white petrolatum (VASELINE) jelly: TOPICAL | @ 02:00:00

## 2020-04-02 MED ADMIN — sodium bicarbonate tablet 650 mg: 650 mg | ORAL | @ 15:00:00 | Stop: 2020-04-02

## 2020-04-02 MED ADMIN — cyanocobalamin (vitamin B-12) tablet 1,000 mcg: 1000 ug | ORAL | @ 13:00:00

## 2020-04-02 MED ADMIN — micafungin (MYCAMINE) 100 mg in sodium chloride (NS) 0.9 % 100 mL IVPB: 100 mg | INTRAVENOUS | @ 13:00:00

## 2020-04-02 MED ADMIN — sevelamer (RENVELA) tablet 1,600 mg: 1600 mg | ORAL | @ 21:00:00

## 2020-04-02 MED ADMIN — azaCITIDine (VIDAZA) syringe: 75 mg/m2 | SUBCUTANEOUS | @ 19:00:00 | Stop: 2020-04-08

## 2020-04-02 MED ADMIN — sodium chloride 0.9% (NS) bolus 1,000 mL: 1000 mL | INTRAVENOUS | @ 13:00:00 | Stop: 2020-04-02

## 2020-04-02 MED ADMIN — valACYclovir (VALTREX) tablet 500 mg: 500 mg | ORAL | @ 13:00:00

## 2020-04-02 MED ADMIN — allopurinoL (ZYLOPRIM) tablet 300 mg: 300 mg | ORAL | @ 02:00:00

## 2020-04-02 MED ADMIN — latanoprost (XALATAN) 0.005 % ophthalmic solution 1 drop: 1 [drp] | OPHTHALMIC | @ 02:00:00

## 2020-04-02 MED ADMIN — ondansetron (ZOFRAN) tablet 16 mg: 16 mg | ORAL | @ 19:00:00 | Stop: 2020-04-08

## 2020-04-02 MED ADMIN — azithromycin (ZITHROMAX) tablet 250 mg: 250 mg | ORAL | @ 13:00:00 | Stop: 2020-04-04

## 2020-04-02 MED ADMIN — venetoclax (VENCLEXTA) tablet 200 mg: 200 mg | ORAL | @ 22:00:00 | Stop: 2020-04-02

## 2020-04-02 MED ADMIN — sodium bicarbonate tablet 650 mg: 650 mg | ORAL | @ 13:00:00 | Stop: 2020-04-02

## 2020-04-02 MED ADMIN — white petrolatum (VASELINE) jelly: TOPICAL | @ 13:00:00

## 2020-04-02 NOTE — Unmapped (Signed)
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 WBC 23k upon initiation of chemo, today WBC 10.9. Cont CBC daily.  Stopped Hydrea, last dose 7/11 AM.  Pt reports diarrhea for several days, CDiff negative, ok to give Imodium PRN.  Evidence of TLS 7/12.  Changed LR to NS, giving 1L bolus, then cont NS at 177mL/hr.  Increasing sodium bicarb in setting of CKD.  Added phos binder 7/12.  TLS labs Q8H.  Continue Allopurinol BID. Continue ppx Valtrex.  Plan to stop Micafungin at discharge and per protocol, plan to switch to Posaconazole on D8. For CAP, cont ceftriaxone & azithromycin 5-day course (7/9-7/13), ICID following.  Urine Histo (7/9) negative. Serum Aspergillus Galactomannan Antigen (7/8), results pending.  Sputum studies ordered, not yet collected due to non-productive cough.  OSH CT Chest images from [facility] (performed 03/19/20) on PACS server, awaiting [facility] radiology review (requested 7/9). Timolol and Latanoprost eye drops of glaucoma.  Cont FloMax and self catherterization for Bladder muscle dysfunction. Added vitamin B12 supplement 7/10 per patient request.     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 Initally treated with intermittent phlebotomy, ruxonlitinib and hydrea and most recently Fedratinib which he was stopped. He is followed locally by Dr. Stacey Drain [facility]). 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 Cytogenetics were normal (46, XY). He underwent another BMBx on 04/07/16 which revealed >95% cellular marrow with 5% blasts by manual aspirate differential, mild reticulin fibrosis, mild dyserythropoiesis with molecular mutations in:??JAK2 V617F, IDH2, RUNX1, SRSF2. He had labs done recently at [facility] which were concerning for acute leukemia (AML) based on falling hemoglobin and 26% blasts reportedly seen from peripheral blood. He underwent a BMBx on 02/28/20 which confirmed AML progression with 22% myeloblasts, unfortunately insufficient sample for flow. 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 Peripheral blood smear revealed 17% circulating myeloblasts with WBC 53.2k. He was started on Hydrea 500 mg BID in addition to allopurinol on 03/06/20. He discussed his treatment options with Dr. Anise Salvo with decision to focus on quality of life and pursue less intensive treatment with Aza/Ven. He was seen in clinic on 03/12/20 with WBC 67k and Hydrea was increased to 1000 mg BID with goal WBC <10k prior to treatment initiation. He requested his therapy be delayed ~2 weeks in order to spend time with family. On admission there is c/f fungal PNA and persistent hyperleukocytosis.  His WBC on admission was 55k. He started C1 Azacitidine + Venetoclax on 7/11 with a starting WBC at 23k.   - C1,D1 = 7/11 Aza+Ven   - Stopped Hydrea, last dose 7/11 AM  - WBC 10 on 7/12, cont CBC daily  - Allopurinol 300 mg BID  - Evidence of TLS 7/12  - Changed LR to NS  on 7/12, giving 1L bolus, then cont NS at 135mL/hr.   - Added phos binder 7/12  - TLS Labs Q8H  - cont Valtrex ppx  [  ] Plan to stop Micafungin at discharge  [  ] Plan to transition to Posaconazole on D8     Disposition:   - Outpatient team working on follow up, see multiple in-baskets       Recent CAP now c/f possible Fungal PNA: Symptoms include cough, SOB/DOE and decreased exercise intolerance. He recently completed a course of Augmentin and Azithromycin with brief resolution of symptoms. He was seen in clinic on 6/15 and reported ongoing cough. He underwent a CT Chest at OSH on 6/28 which revealed multifocal GGO, Rt >Lt as well as associated consolidative opacity in RML with associated collapse as well as scattered tiny pulmonary nodules and bilateral trace pleural effusions. Incidental finding of massive splenomegaly with markedly increased size since prior CT A/P on 04/03/19. CT chest repeated at [facility] on 7/8 revealed multifocal RUL opacities; likely PNA. Small B pleural effusions. Splenomegaly and Rt hydronephrosis. ICID consulted on 7/8, recommendations below; signed off.   [  ]  OSH CT Chest images from [facility] (performed 03/19/20) images on PACS server, awaiting [facility] radiology review (requested 7/9)  - Per ICID, will retreat for CAP with a 5-day course of Ceftriaxone and Azithromycin (7/9-7/13)  - Micafungin ppx as above  - Urine Histo (7/9) negative.   [  ] Serum Aspergillus Galactomannan Antigen (7/8), results pending (1-7 day turnaround time).  [  ] sputum sample ordered (bacterial and fungal cx, AFB) - not currently producing sputum  - Negative results: RPP/COVID, urine legionella, Serum Fungitell, Serum Cyrpto     Recent Epistaxis: Per chart review patient has history of nosebleeds requiring cauterization. Was recently seen by ENT with cautherization to right nasal passage.  - Afrin PRN and hold pressure for 20 seconds with any reoccurrence  - Consider increasing PLT threshold if difficult to control  - Vaseline and nasal saline for nasal dryness  [  ] could consider tranexamic acid if epistaxis recurrs    Hx CKD: Baseline SCr appears to be around 1.5-1.8. Upon admission SCr 1.61. SCr below baseline with adequate hydration.    - TLS as above  - Avoid nephrotoxic agents as able, Renally dose medications as indicated - Increased sodium bicarb dose 7/12 in setting of CKD.   ??  Hx Crohn's Disease, with diarrhea currently: Patient denies any abdominal pain, melena or BRBPR. He is not currently on any medical management. On 7/12, pt reported frequent diarrhea for several days.  Given recent antibiotic history, checked C. Diff, (negative). Mucositis SE of Hydrea could also be contributing to GI sx.  - Imodium PRN   - Monitor for additional mucositis symptom development  - Hold home multivitamins; ok for wife to bring in probiotics, if desired  ??  Bladder Muscle Dysfunction  - I/O Cath per patient's home regimen  - Continue FloMax  ??  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 Expected to become pancytopenic in coming days with initiation of Hydrea/chemotherapy   - Transfuse 1 unit of pRBCs for hgb >7  - Transfuse 1 unit plt for plts >10K     Nutrition:  Subjective:   Pt endorses dark brown liquidy stool for several days.  Endorses cough for several days.  Remains afebrile. Making adequate volume urine (more than his typical).    Objective:     Vital signs in last 24 hours:  Temp:  [36.5 ??C (97.7 ??F)-37.5 ??C (99.5 ??F)] 36.7 ??C (98.1 ??F)  Heart Rate:  [78-108] 86  Resp:  [18-20] 20  BP: (120-149)/(70-87) 120/70  MAP (mmHg):  [85-106] 85  SpO2:  [98 %-100 %] 100 %    Intake/Output last 3 shifts:  I/O last 3 completed shifts:  In: 500 [P.O.:500]  Out: 6050 [Urine:6050]    Meds:  Current Facility-Administered Medications   Medication Dose Route Frequency Provider Last Rate Last Admin   ??? allopurinoL (ZYLOPRIM) tablet 300 mg  300 mg Oral BID Herbert Moors, AGNP   300 mg at 04/02/20 0847   ??? azaCITIDine (VIDAZA) syringe  75 mg/m2 (Treatment Plan Recorded) Subcutaneous Q24H Wynn Banker, MD   144.75 mg at 04/01/20 1506   ??? azithromycin (ZITHROMAX) tablet 250 mg  250 mg Oral Q24H [facility] [address] [place], [place]   250 mg at 04/02/20 2956   ??? cefTRIAXone (ROCEPHIN) 1 g in sodium chloride 0.9 % (NS) 100 mL IVPB-connector bag  1 g Intravenous Q24H [address] [place], [place]   Stopped at 04/01/20 1138   ??? cyanocobalamin (vitamin B-12) tablet 1,000 mcg  1,000 mcg Oral Daily [address] [place], PA   1,000 mcg at 04/02/20 0846   ??? diphenhydrAMINE (BENADRYL) injection 25 mg  25 mg Intravenous Q4H PRN Wynn Banker, MD       ??? emollient combination no.92 (LUBRIDERM) lotion 1 application  1 application Topical Q1H PRN Herbert Moors, AGNP       ??? EPINEPHrine (EPIPEN) injection 0.3 mg  0.3 mg Intramuscular Daily PRN Wynn Banker, MD       ??? famotidine (PF) (PEPCID) injection 20 mg  20 mg Intravenous Q4H PRN Wynn Banker, MD       ??? finasteride (PROSCAR) tablet 5 mg  5 mg Oral Daily Herbert Moors, AGNP   5 mg at 04/02/20 0847   ??? IP OKAY TO TREAT   Other Continuous PRN [address] [place], PA       ??? latanoprost (XALATAN) 0.005 % ophthalmic solution 1 drop  1 drop Both Eyes Nightly Herbert Moors, AGNP   1 drop at 04/01/20 2144   ??? loperamide (IMODIUM) capsule 2 mg  2 mg Oral Q2H PRN Herbert Moors, AGNP       ??? loperamide (IMODIUM) capsule 4 mg  4 mg Oral Once PRN Herbert Moors, AGNP       ??? meperidine (DEMEROL) injection 25 mg  25 mg Intravenous Q30 Min PRN Wynn Banker, MD       ??? methylPREDNISolone sodium succinate (PF) (Solu-MEDROL) injection 125 mg  125 mg Intravenous Q4H PRN Wynn Banker, MD       ??? micafungin (MYCAMINE) 100 mg in sodium chloride (NS) 0.9 % 100 mL IVPB  100 mg Intravenous Q24H [facility] Herbert Moors, AGNP   Stopped at 04/02/20 0945   ??? ondansetron (ZOFRAN) tablet 16 mg  16 mg Oral Q24H Wynn Banker, MD   16 mg at 04/01/20 1431   ??? oxymetazoline (AFRIN) 0.05 % nasal spray 2 spray  2 spray Each Nare BID PRN [facility] [place] Adelma [place], AGNP   2 spray at 03/31/20 0959   ???  sevelamer (RENVELA) tablet 1,600 mg  1,600 mg Oral 3xd [facility]als [address] [place], AGNP   1,600 mg at 04/02/20 0901   ??? sodium bicarbonate tablet 1,300 mg  1,300 mg Oral BID [facility], AGNP       ??? sodium chloride (NS) 0.9 % infusion  20 mL/hr Intravenous Continuous PRN Wynn Banker, MD       ??? sodium chloride 0.9% (NS) bolus 1,000 mL  1,000 mL Intravenous Once [facility], AGNP 250 mL/hr at 04/02/20 0853 1,000 mL at 04/02/20 0853    Followed by   ??? sodium chloride (NS) 0.9 % infusion  125 mL/hr Intravenous Continuous [address] AGNP       ??? sodium chloride (OCEAN) 0.65 % nasal spray 1 spray  1 spray Each Nare Q6H PRN Ernie Hew, AGNP       ??? sodium chloride 0.9% (NS) bolus 1,000 mL  1,000 mL Intravenous Daily PRN Wynn Banker, MD       ??? timolol (TIMOPTIC) 0.5 % ophthalmic solution 1 drop  1 drop Both Eyes Daily Herbert Moors, AGNP   1 drop at 04/02/20 0848   ??? valACYclovir (VALTREX) tablet 500 mg  500 mg Oral Daily Herbert Moors, AGNP   500 mg at 04/02/20 9629   ??? venetoclax (VENCLEXTA) tablet 200 mg  200 mg Oral Q24H Wynn Banker, MD       ??? [START ON 04/03/2020] venetoclax (VENCLEXTA) tablet 400 mg  400 mg Oral Q24H Wynn Banker, MD       ??? white petrolatum (VASELINE) jelly   Topical BID Herbert Moors, [place]   Given at 04/02/20 0848       Physical Exam:  General: Well developed, well-nourished male in no acute distress.   Neuro: A&Ox4. Appropriate affect. Speech fluent. CNII-CNXII grossly intact. Gait steady.   HEENT: PER. Hearing intact to conversation. No scleral icterus or conjunctival injection. MMM.   Respiratory: Breathing is unlabored. Lungs CTAB. No rhonchi, wheezes, or crackles.    Cardiovascular: RRR.  S1, S2 present.  No m/r/g. Pulses 2+ and symmetric bilaterally.  Extremities well perfused.   GI: Soft, NTND abdomen.   Musculoskeletal: No grossly-evident deformities. ROM grossly intact in all 4 extremities. No clubbing or cyanosis. 1+ BLE/ankle edema.   Skin: Warm, dry. No rashes, obvious lesions, excessive petechiae/purpura.   Line: PIV, dressing clean dry and intact.    Labs:  Recent Labs     03/31/20  0605 03/31/20  1653 04/01/20  0600 04/01/20  0601 04/01/20  1304 04/01/20  1644 04/02/20  0550   WBC 28.7*   < >   < >  --  24.7* 23.7* 10.9   NEUTROABS 21.0*   < >  --   --  14.1* 15.9* 8.1*   LYMPHSABS  --   --   --   --  4.2  --   --    HGB 10.5*   < >   < >  --  11.5* 11.0* 10.7*   HCT 34.2*   < >   < >  --  38.6* 37.9* 34.8*   PLT 97*   < >   < >  --  101* 94* 71*   CREATININE 1.46*   < >   < > 1.32* 1.32* 1.28 1.30   BUN 37*   < >   < > 34* 37* 37* 44*   BILITOT 0.6  --   --  0.6 0.6  --  0.9   BILIDIR <0.10  --   --  <0.10  --   --  0.10   AST 48  --   --  48 59*  --  53   ALT 8  --   --  8 10  --  8   ALKPHOS 95  --   --  95 107  --  83   K 4.3   < >   < > 4.4 5.1* 4.4 5.2*   MG 1.9  --   --  1.8 1.9  --  1.9   CALCIUM 8.2*   < >   < > 8.0* 8.3* 8.2* 8.0*   NA 134*   < >   < > 137 134* 132* 134*   CL 108*   < >   < > 111* 108* 106 109*   CO2 21.0*   < >   < > 19.0* 19.0* 18.0* 16.0*   PHOS 5.0*   < >   < > 4.4 5.2* 4.9* 7.3*    < > = values in this interval not displayed.       Imaging:  Reviewed per EMR.    Teola Bradley, AGACNP-Student  Hematology/Oncology Department   [facility]   Group Pager: [phone]     I saw and examined this patient and had direct input on the plan above.     Glendell Docker, NP  Hematology/Oncology  Group pager: [phone]    04/02/20

## 2020-04-02 NOTE — Unmapped (Signed)
AxO x 4. VSS, remained afebrile and free from falls. No complaints of pain or nausea. Wife at bedside this afternoon. LR at 137mL/hr. Chemo Cycle 1 Day 1 at 1500. Will continue to monitor.    Problem: Adult Inpatient Plan of Care  Goal: Plan of Care Review  Outcome: Ongoing - Unchanged  Goal: Patient-Specific Goal (Individualization)  Outcome: Ongoing - Unchanged  Goal: Absence of Hospital-Acquired Illness or Injury  Outcome: Ongoing - Unchanged  Goal: Optimal Comfort and Wellbeing  Outcome: Ongoing - Unchanged  Goal: Readiness for Transition of Care  Outcome: Ongoing - Unchanged  Goal: Rounds/Family Conference  Outcome: Ongoing - Unchanged     Problem: Infection  Goal: Infection Symptom Resolution  Outcome: Ongoing - Unchanged

## 2020-04-03 DIAGNOSIS — Z5111 Encounter for antineoplastic chemotherapy: Secondary | ICD-10-CM | POA: Diagnosis not present

## 2020-04-03 DIAGNOSIS — D6959 Other secondary thrombocytopenia: Secondary | ICD-10-CM | POA: Diagnosis not present

## 2020-04-03 DIAGNOSIS — R079 Chest pain, unspecified: Secondary | ICD-10-CM | POA: Diagnosis not present

## 2020-04-03 DIAGNOSIS — C9200 Acute myeloblastic leukemia, not having achieved remission: Secondary | ICD-10-CM | POA: Diagnosis not present

## 2020-04-03 LAB — BLOOD GAS, VENOUS
O2 SATURATION VENOUS: 91.4 % — ABNORMAL HIGH (ref 40.0–85.0)
PCO2 VENOUS: 33 mmHg — ABNORMAL LOW (ref 40–60)
PH VENOUS: 7.35 (ref 7.32–7.43)
PO2 VENOUS: 65 mmHg — ABNORMAL HIGH (ref 30–55)

## 2020-04-03 LAB — BASIC METABOLIC PANEL
ANION GAP: 9 mmol/L (ref 7–15)
ANION GAP: 8 mmol/L (ref 5–14)
ANION GAP: 5 mmol/L (ref 5–14)
BLOOD UREA NITROGEN: 46 mg/dL — ABNORMAL HIGH (ref 9–23)
BLOOD UREA NITROGEN: 46 mg/dL — ABNORMAL HIGH (ref 9–23)
BUN / CREAT RATIO: 37
BUN / CREAT RATIO: 32
BUN / CREAT RATIO: 33
CALCIUM: 7.8 mg/dL — ABNORMAL LOW (ref 8.5–10.2)
CALCIUM: 8.4 mg/dL — ABNORMAL LOW (ref 8.7–10.4)
CALCIUM: 8.6 mg/dL — ABNORMAL LOW (ref 8.7–10.4)
CHLORIDE: 107 mmol/L (ref 98–107)
CHLORIDE: 106 mmol/L (ref 98–107)
CHLORIDE: 103 mmol/L (ref 98–107)
CO2: 15 mmol/L — ABNORMAL LOW (ref 22.0–30.0)
CREATININE: 1.35 mg/dL — ABNORMAL HIGH (ref 0.70–1.30)
CREATININE: 1.44 mg/dL — ABNORMAL HIGH
CREATININE: 1.39 mg/dL — ABNORMAL HIGH
EGFR CKD-EPI AA MALE: 60 mL/min/{1.73_m2} (ref >=60)
EGFR CKD-EPI AA MALE: 55 mL/min/{1.73_m2} — ABNORMAL LOW (ref >=60)
EGFR CKD-EPI AA MALE: 58 mL/min/{1.73_m2} — ABNORMAL LOW (ref >=60)
EGFR CKD-EPI NON-AA MALE: 52 mL/min/{1.73_m2} — ABNORMAL LOW (ref >=60)
EGFR CKD-EPI NON-AA MALE: 48 mL/min/{1.73_m2} — ABNORMAL LOW (ref >=60)
EGFR CKD-EPI NON-AA MALE: 50 mL/min/{1.73_m2} — ABNORMAL LOW (ref >=60)
GLUCOSE RANDOM: 90 mg/dL (ref 70–179)
GLUCOSE RANDOM: 101 mg/dL (ref 70–179)
GLUCOSE RANDOM: 102 mg/dL (ref 70–179)
POTASSIUM: 4.9 mmol/L (ref 3.5–5.0)
POTASSIUM: 4.4 mmol/L (ref 3.4–4.5)
SODIUM: 131 mmol/L — ABNORMAL LOW (ref 135–145)
SODIUM: 133 mmol/L — ABNORMAL LOW (ref 135–145)
SODIUM: 129 mmol/L — ABNORMAL LOW (ref 135–145)

## 2020-04-03 LAB — HEPATIC FUNCTION PANEL
ALBUMIN: 2.9 g/dL — ABNORMAL LOW (ref 3.5–5.0)
ALKALINE PHOSPHATASE: 70 U/L (ref 38–126)
AST (SGOT): 49 U/L (ref 19–55)
BILIRUBIN DIRECT: <0.1 mg/dL (ref 0.00–0.40)
BILIRUBIN TOTAL: 0.7 mg/dL (ref 0.0–1.2)

## 2020-04-03 LAB — LACTATE DEHYDROGENASE: Lactate dehydrogenase:CCnc:Pt:Ser/Plas:Qn:Reaction: pyruvate to lactate: 1677 — ABNORMAL HIGH

## 2020-04-03 LAB — EGFR CKD-EPI NON-AA MALE
Glomerular filtration rate/1.73 sq M.predicted.non black:ArVRat:Pt:Ser/Plas/Bld:Qn:Creatinine-based formula (CKD-EPI): 50 — ABNORMAL LOW

## 2020-04-03 LAB — MANUAL DIFFERENTIAL
BASOPHILS - ABS (DIFF): 0.1 10*9/L (ref 0.0–0.1)
BASOPHILS - REL (DIFF): 2 %
BLASTS - REL (DIFF): 17 % (ref <=0)
EOSINOPHILS - ABS (DIFF): 0 10*9/L (ref 0.0–0.4)
EOSINOPHILS - REL (DIFF): 0 %
LYMPHOCYTES - ABS (DIFF): 0.6 10*9/L — ABNORMAL LOW (ref 1.5–5.0)
MONOCYTES - ABS (DIFF): 0.1 10*9/L — ABNORMAL LOW (ref 0.2–0.8)
NEUTROPHILS - ABS (DIFF): 5.1 10*9/L (ref 2.0–7.5)
NEUTROPHILS - REL (DIFF): 71 %

## 2020-04-03 LAB — SPECIMEN SOURCE

## 2020-04-03 LAB — CBC W/ AUTO DIFF
HEMATOCRIT: 33.1 % — ABNORMAL LOW (ref 41.0–53.0)
MEAN CORPUSCULAR HEMOGLOBIN CONC: 30.8 g/dL — ABNORMAL LOW (ref 31.0–37.0)
MEAN CORPUSCULAR HEMOGLOBIN: 28.1 pg (ref 26.0–34.0)
MEAN CORPUSCULAR VOLUME: 91.2 fL (ref 80.0–100.0)
MEAN PLATELET VOLUME: 8.5 fL (ref 7.0–10.0)
NUCLEATED RED BLOOD CELLS: 3 /100{WBCs} (ref <=4)
RED BLOOD CELL COUNT: 3.63 10*12/L — ABNORMAL LOW (ref 4.50–5.90)
RED CELL DISTRIBUTION WIDTH: 24.2 % — ABNORMAL HIGH (ref 12.0–15.0)
WBC ADJUSTED: 7.2 10*9/L (ref 4.5–11.0)

## 2020-04-03 LAB — ASPERGILLUS AG SERUM: Lab: <0.5

## 2020-04-03 LAB — PHOSPHORUS
PHOSPHORUS: 7.4 mg/dL — ABNORMAL HIGH (ref 2.9–4.7)
Phosphate:MCnc:Pt:Ser/Plas:Qn:: 6.2 — ABNORMAL HIGH
Phosphate:MCnc:Pt:Ser/Plas:Qn:: 6.7 — ABNORMAL HIGH
Phosphate:MCnc:Pt:Ser/Plas:Qn:: 7.4 — ABNORMAL HIGH

## 2020-04-03 LAB — ANISOCYTOSIS

## 2020-04-03 LAB — CO2
Carbon dioxide:SCnc:Pt:Ser/Plas:Qn:: 15 — ABNORMAL LOW
Carbon dioxide:SCnc:Pt:Ser/Plas:Qn:: 19 — ABNORMAL LOW

## 2020-04-03 LAB — BASOPHILS - REL (DIFF): Basophils/100 leukocytes:NFr:Pt:Bld:Qn:Manual count: 2

## 2020-04-03 LAB — ALT (SGPT): Alanine aminotransferase:CCnc:Pt:Ser/Plas:Qn:: 7

## 2020-04-03 LAB — URIC ACID
Urate:MCnc:Pt:Ser/Plas:Qn:: 3.9 — ABNORMAL LOW
Urate:MCnc:Pt:Ser/Plas:Qn:: 4.2
Urate:MCnc:Pt:Ser/Plas:Qn:: 4.2

## 2020-04-03 LAB — MAGNESIUM: Magnesium:MCnc:Pt:Ser/Plas:Qn:: 2.1

## 2020-04-03 MED ADMIN — valACYclovir (VALTREX) tablet 500 mg: 500 mg | ORAL | @ 13:00:00

## 2020-04-03 MED ADMIN — traMADoL (ULTRAM) tablet 50 mg: 50 mg | ORAL | @ 03:00:00

## 2020-04-03 MED ADMIN — oxymetazoline (AFRIN) 0.05 % nasal spray 2 spray: 2 | NASAL | @ 18:00:00

## 2020-04-03 MED ADMIN — sevelamer (RENVELA) tablet 2,400 mg: 2400 mg | ORAL | @ 16:00:00

## 2020-04-03 MED ADMIN — timolol (TIMOPTIC) 0.5 % ophthalmic solution 1 drop: 1 [drp] | OPHTHALMIC | @ 13:00:00

## 2020-04-03 MED ADMIN — lidocaine (LIDODERM) 5 % patch 1 patch: 1 | TRANSDERMAL | @ 03:00:00

## 2020-04-03 MED ADMIN — finasteride (PROSCAR) tablet 5 mg: 5 mg | ORAL | @ 13:00:00

## 2020-04-03 MED ADMIN — latanoprost (XALATAN) 0.005 % ophthalmic solution 1 drop: 1 [drp] | OPHTHALMIC | @ 02:00:00

## 2020-04-03 MED ADMIN — azaCITIDine (VIDAZA) syringe: 75 mg/m2 | SUBCUTANEOUS | @ 19:00:00 | Stop: 2020-04-08

## 2020-04-03 MED ADMIN — tranexamic acid tablet 1,300 mg: 1300 mg | ORAL | @ 20:00:00

## 2020-04-03 MED ADMIN — dextromethorphan-guaifenesin (ROBITUSSIN-DM) 2-20 mg/mL oral syrup: 10 mL | ORAL | @ 03:00:00

## 2020-04-03 MED ADMIN — allopurinoL (ZYLOPRIM) tablet 300 mg: 300 mg | ORAL | @ 01:00:00

## 2020-04-03 MED ADMIN — sodium bicarbonate tablet 1,300 mg: 1300 mg | ORAL | @ 17:00:00

## 2020-04-03 MED ADMIN — white petrolatum (VASELINE) jelly: TOPICAL | @ 14:00:00

## 2020-04-03 MED ADMIN — sodium bicarbonate tablet 1,300 mg: 1300 mg | ORAL | @ 01:00:00

## 2020-04-03 MED ADMIN — sevelamer (RENVELA) tablet 2,400 mg: 2400 mg | ORAL | @ 20:00:00

## 2020-04-03 MED ADMIN — sodium chloride (NS) 0.9 % infusion: 125 mL/h | INTRAVENOUS | @ 01:00:00

## 2020-04-03 MED ADMIN — acetaminophen (TYLENOL) tablet 650 mg: 650 mg | ORAL | @ 03:00:00 | Stop: 2020-04-02

## 2020-04-03 MED ADMIN — venetoclax (VENCLEXTA) tablet 400 mg: 400 mg | ORAL | @ 22:00:00

## 2020-04-03 NOTE — Unmapped (Signed)
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 WBC 23k upon initiation of chemo, down to 7.2 today, cont CBC daily.  Stopped Hydrea, last dose 7/11 AM.  Pt reports diarrhea for several days, CDiff negative, ordered Imodium PRN, pt not using it.  TLS labs Q8H, stopped IVF.  Increased sodium bicarb in setting of CKD.  Added phos binder 7/12, increased dose 7/13.  Continue Allopurinol BID. Continue ppx Valtrex.  Plan to stop Micafungin at discharge and per protocol, plan to switch to Posaconazole on D8. For CAP, cont ceftriaxone & azithromycin 5-day course (7/9-7/13), ICID following.  Cough improving.  Some Rt-posterior lateral chest pain, CXR c/w pneumonia, no pneumothorax.  Urine Histo (7/9) negative. Serum Aspergillus Galactomannan Antigen (7/8), results pending.  Sputum studies ordered, not yet collected due to non-productive cough.  OSH CT Chest images from [facility] (performed 03/19/20) on PACS server, awaiting [facility] radiology review (requested 7/9). Timolol and Latanoprost eye drops of glaucoma.  Cont FloMax and self catherterization for Bladder muscle dysfunction. Added vitamin B12 supplement 7/10 per patient request.     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 Initally treated with intermittent phlebotomy, ruxonlitinib and hydrea and most recently Fedratinib which he was stopped. He is followed locally by Dr. Stacey Drain [facility]). 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 Cytogenetics were normal (46, XY). He underwent another BMBx on 04/07/16 which revealed >95% cellular marrow with 5% blasts by manual aspirate differential, mild reticulin fibrosis, mild dyserythropoiesis with molecular mutations in:??JAK2 V617F, IDH2, RUNX1, SRSF2. He had labs done recently at [facility] which were concerning for acute leukemia (AML) based on falling hemoglobin and 26% blasts reportedly seen from peripheral blood. He underwent a BMBx on 02/28/20 which confirmed AML progression with 22% myeloblasts, unfortunately insufficient sample for flow. 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 Peripheral blood smear revealed 17% circulating myeloblasts with WBC 53.2k. He was started on Hydrea 500 mg BID in addition to allopurinol on 03/06/20. He discussed his treatment options with Dr. Anise Salvo with decision to focus on quality of life and pursue less intensive treatment with Aza/Ven. He was seen in clinic on 03/12/20 with WBC 67k and Hydrea was increased to 1000 mg BID with goal WBC <10k prior to treatment initiation. He requested his therapy be delayed ~2 weeks in order to spend time with family. On admission there is c/f fungal PNA and persistent hyperleukocytosis.  His WBC on admission was 55k. He started C1 Azacitidine + Venetoclax on 7/11 with a starting WBC at 23k.   - C1,D1 = 7/11 Aza+Ven   - Stopped Hydrea, last dose 7/11 AM  - CBC daily  - Allopurinol 300 mg BID  - TLS labs Q8H  [  ] monitoring serum bicarb, stable  today  - Stopping IVF  - Increased sevelamer dose   - cont Valtrex ppx  [  ] Plan to stop Micafungin at discharge  [  ] Plan to transition to Posaconazole on D8     Disposition:   - Outpatient team working on follow up, see multiple in-baskets       Recent CAP now c/f possible Fungal PNA: Symptoms include cough, SOB/DOE and decreased exercise intolerance. He recently completed a course of Augmentin and Azithromycin with brief resolution of symptoms. He was seen in clinic on 6/15 and reported ongoing cough. He underwent a CT Chest at OSH on 6/28 which revealed multifocal GGO, Rt >Lt as well as associated consolidative opacity in RML with associated collapse as well as scattered tiny pulmonary nodules and bilateral trace pleural effusions. Incidental finding of massive splenomegaly with markedly increased size since prior CT A/P on 04/03/19. CT chest repeated at [facility] on 7/8 revealed multifocal RUL opacities; likely PNA. Small B pleural effusions. Splenomegaly and Rt hydronephrosis. ICID consulted on 7/8, recommendations below; signed off.   - c/o right posterior-lateral chest pain, suspect 2/2 coughing given pneumonia.  CXR (7/12) consistent with pneumonia, no pneumothorax  [  ]  OSH CT Chest images from [facility] (performed 03/19/20) images on PACS server, awaiting [facility] radiology review (requested 7/9)  - Per ICID, will retreat for CAP with a 5-day course of Ceftriaxone and Azithromycin (7/9-7/13)  - Micafungin ppx as above  - Urine Histo (7/9) negative.   [  ] Serum Aspergillus Galactomannan Antigen (7/8), results pending (1-7 day turnaround time).  [  ] sputum sample ordered (bacterial and fungal cx, AFB) - not currently producing sputum  - Negative results: RPP/COVID, urine legionella, Serum Fungitell, Serum Cyrpto     Recent Epistaxis: Per chart review patient has history of nosebleeds requiring cauterization. Was recently seen by ENT with cautherization to right nasal passage.  - Afrin PRN and hold pressure for 20 seconds with any reoccurrence  - Consider increasing PLT threshold if difficult to control  - Vaseline and nasal saline for nasal dryness  [  ] could consider tranexamic acid if epistaxis recurrs    Hx CKD: Baseline SCr appears to be around 1.5-1.8. Upon admission SCr 1.61. SCr below baseline with adequate hydration.    - TLS as above  - Avoid nephrotoxic agents as able, Renally dose medications as indicated  - Increased sodium bicarb again, in setting of CKD  ??  Hx Crohn's Disease, with diarrhea currently: Patient denies any abdominal pain, melena or BRBPR. He is not currently on any medical management. On 7/12, pt reported frequent diarrhea for several days.  Given recent antibiotic history, checked C. Diff, (negative). Mucositis SE of Hydrea could also be contributing to GI sx.  - Imodium ordered PRN , though patient has not used it  - Monitor for additional mucositis symptom development  - Hold home multivitamins; ok for wife to bring in probiotics, if desired  ??  Bladder Muscle Dysfunction  - I/O Cath per patient's home regimen  - Continue FloMax  ??  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 Expected to become pancytopenic in coming days with initiation of Hydrea/chemotherapy   - Transfuse 1 unit of pRBCs for hgb >7  - Transfuse 1 unit plt for plts >10K     Nutrition:  Subjective:   Overnight with right posterior-lateral chest pain.  Patient reports it feels like a pulled muscle.  Reports improving cough.  States diarrhea seems to be getting better as he was able to straight cath this morning with no episode of diarrhea.  Remains afebrile.     Objective:     Vital signs in last 24 hours:  Temp:  [36.1 ??C (97 ??F)-36.7 ??C (98.1 ??F)] 36.4 ??C (97.5 ??F)  Heart Rate:  [79-88] 79  Resp:  [18-20] 18  BP: (130-150)/(71-84) 140/71  MAP (mmHg):  [91-99] 92  SpO2:  [95 %-100 %] 100 %    Intake/Output last 3 shifts:  I/O last 3 completed shifts:  In: 1018 [P.O.:1018]  Out: 6500 [Urine:6500]    Meds:  Current Facility-Administered Medications   Medication Dose Route Frequency Provider Last Rate Last Admin   ??? allopurinoL (ZYLOPRIM) tablet 300 mg  300 mg Oral BID Herbert Moors, AGNP   300 mg at 04/03/20 0919   ??? azaCITIDine (VIDAZA) syringe  75 mg/m2 (Treatment Plan Recorded) Subcutaneous Q24H Wynn Banker, MD   144.75 mg at 04/02/20 1503   ??? cefTRIAXone (ROCEPHIN) 1 g in sodium chloride 0.9 % (NS) 100 mL IVPB-connector bag  1 g Intravenous Q24H [address] [place], [place]   Stopped at 04/02/20 1413   ??? cyanocobalamin (vitamin B-12) tablet 1,000 mcg  1,000 mcg Oral Daily Court Joy Churchwell, PA   1,000 mcg at 04/03/20 0919   ??? dextromethorphan-guaifenesin (ROBITUSSIN-DM) 2-20 mg/mL oral syrup  10 mL Oral Q4H PRN Janyth Contes, FNP   10 mL at 04/02/20 2318   ??? diphenhydrAMINE (BENADRYL) injection 25 mg  25 mg Intravenous Q4H PRN Wynn Banker, MD       ??? emollient combination no.92 (LUBRIDERM) lotion 1 application  1 application Topical Q1H PRN Herbert Moors, AGNP       ??? EPINEPHrine (EPIPEN) injection 0.3 mg  0.3 mg Intramuscular Daily PRN Wynn Banker, MD       ??? famotidine (PF) (PEPCID) injection 20 mg  20 mg Intravenous Q4H PRN Wynn Banker, MD       ??? finasteride (PROSCAR) tablet 5 mg  5 mg Oral Daily Herbert Moors, AGNP   5 mg at 04/03/20 0919   ??? IP OKAY TO TREAT   Other Continuous PRN Court Joy Churchwell, PA       ??? latanoprost (XALATAN) 0.005 % ophthalmic solution 1 drop  1 drop Both Eyes Nightly Herbert Moors, AGNP   1 drop at 04/02/20 2152   ??? lidocaine (LIDODERM) 5 % patch 1 patch  1 patch Transdermal Daily PRN Janyth Contes, FNP   1 patch at 04/02/20 2318   ??? loperamide (IMODIUM) capsule 2 mg  2 mg Oral Q2H PRN Herbert Moors, AGNP       ??? loperamide (IMODIUM) capsule 4 mg  4 mg Oral Once PRN Herbert Moors, AGNP       ??? meperidine (DEMEROL) injection 25 mg  25 mg Intravenous Q30 Min PRN Wynn Banker, MD       ??? methylPREDNISolone sodium succinate (PF) (Solu-MEDROL) injection 125 mg  125 mg Intravenous Q4H PRN Wynn Banker, MD ??? micafungin (MYCAMINE) 100 mg in sodium chloride (NS) 0.9 % 100 mL IVPB  100 mg Intravenous Q24H [facility] Herbert Moors, AGNP   Stopped at 04/03/20 1021   ??? ondansetron (ZOFRAN) tablet 16 mg  16 mg Oral Q24H Anastasio Auerbach  Painschab, MD   16 mg at 04/02/20 1443   ??? oxymetazoline (AFRIN) 0.05 % nasal spray 2 spray  2 spray Each Nare BID PRN [place] Shadai Mcclane, AGNP   2 spray at 03/31/20 0959   ??? sevelamer (RENVELA) tablet 2,400 mg  2,400 mg Oral 3xd [facility], AGNP   2,400 mg at 04/03/20 1610   ??? sodium bicarbonate tablet 1,300 mg  1,300 mg Oral TID [facility], AGNP       ??? sodium chloride (NS) 0.9 % infusion  20 mL/hr Intravenous Continuous PRN Wynn Banker, MD       ??? sodium chloride (OCEAN) 0.65 % nasal spray 1 spray  1 spray Each Nare Q6H PRN Ernie Hew, AGNP       ??? sodium chloride 0.9% (NS) bolus 1,000 mL  1,000 mL Intravenous Daily PRN Wynn Banker, MD       ??? timolol (TIMOPTIC) 0.5 % ophthalmic solution 1 drop  1 drop Both Eyes Daily Herbert Moors, AGNP   1 drop at 04/03/20 9604   ??? traMADoL (ULTRAM) tablet 50 mg  50 mg Oral Q8H PRN Janyth Contes, FNP   50 mg at 04/02/20 2300   ??? valACYclovir (VALTREX) tablet 500 mg  500 mg Oral Daily Herbert Moors, AGNP   500 mg at 04/03/20 0920   ??? venetoclax (VENCLEXTA) tablet 400 mg  400 mg Oral Q24H Wynn Banker, MD       ??? white petrolatum (VASELINE) jelly   Topical BID Herbert Moors, AGNP   Given at 04/03/20 0930       Physical Exam:  General: Well developed, well-nourished male in no acute distress.   Neuro: A&Ox4. Appropriate affect. Speech fluent. CNII-CNXII grossly intact. Gait steady.   HEENT: PER. Hearing intact to conversation. No scleral icterus or conjunctival injection. MMM.  Dry lips.   Respiratory: Breathing is unlabored. Lungs CTAB. No rhonchi, wheezes, or crackles.    Cardiovascular: RRR.  S1, S2 present.  No m/r/g. Pulses 2+ and symmetric bilaterally.  Extremities well perfused.   GI: Soft, NTND abdomen. Minimal erythema at abdominal injection sites  Musculoskeletal: No grossly-evident deformities. ROM grossly intact in all 4 extremities. No clubbing or cyanosis. 1+ BLE/ankle edema.   Skin: Warm, dry.  Left forearm with mild edema and ecchymosis from infiltrated PIV that has been removed.  Minimal petechiae noted on BLE.  No purpura.  No open lesions.  Line: PIV Rt forearm, dressing clean, dry and intact.    Labs:  Recent Labs     04/01/20  0600 04/01/20  0601 04/01/20  1304 04/01/20  1644 04/01/20  1644 04/02/20  0550 04/02/20  0550 04/02/20  1435 04/02/20  2159 04/03/20  0550 04/03/20  0551   WBC   < >  --  24.7* 23.7*  --  10.9  --   --   --  7.2  --    NEUTROABS   < >  --  14.1* 15.9*  --  8.1*  --   --   --  5.1  --    LYMPHSABS  --   --  4.2  --   --   --   --   --   --   --   --    HGB   < >  --  11.5* 11.0*  --  10.7*  --   --   --  10.2*  --  HCT   < >  --  38.6* 37.9*  --  34.8*  --   --   --  33.1*  --    PLT   < >  --  101* 94*  --  71*  --   --   --  52*  --    CREATININE   < > 1.32* 1.32* 1.28   < > 1.30   < > 1.38* 1.38*  --  1.35*   BUN   < > 34* 37* 37*   < > 44*   < > 46* 50*  --  50*   BILITOT   < > 0.6 0.6  --   --  0.9  --   --   --   --  0.7   BILIDIR  --  <0.10  --   --   --  0.10  --   --   --   --  <0.10   AST   < > 48 59*  --   --  53  --   --   --   --  49   ALT   < > 8 10  --   --  8  --   --   --   --  7   ALKPHOS   < > 95 107  --   --  83  --   --   --   --  70   K   < > 4.4 5.1* 4.4   < > 5.2*   < > 5.3* 4.9  --  4.9   MG   < > 1.8 1.9  --   --  1.9  --   --   --   --  2.1   CALCIUM   < > 8.0* 8.3* 8.2*   < > 8.0*   < > 7.8* 7.7*  --  7.8*   NA   < > 137 134* 132*   < > 134*   < > 132* 133*  --  131*   CL   < > 111* 108* 106   < > 109*   < > 106 106  --  107   CO2   < > 19.0* 19.0* 18.0*   < > 16.0*   < > 20.0* 14.0*  --  15.0*   PHOS   < > 4.4 5.2* 4.9*   < > 7.3*   < > 7.2* 7.1*  --  7.4*    < > = values in this interval not displayed. Imaging:  Reviewed per EMR.    Teola Bradley, AGACNP-Student  Hematology/Oncology Department   [facility]   Group Pager: [phone]     I saw and examined this patient and had direct input on the plan above.     Glendell Docker, NP  Hematology/Oncology  Group pager: [phone]    04/03/20

## 2020-04-03 NOTE — Unmapped (Signed)
Pt alert and oriented x4. Pt has been afebrile with stable VS. Pt with no c/o pain or nausea. Pt with active bowels and good urine output. Pt is c.diff negative. Pt got chemo at 1500. Pt continues on IV fluids. Chemo precautions and safety maintained. Will continue to monitor.       Problem: Adult Inpatient Plan of Care  Goal: Plan of Care Review  Outcome: Ongoing - Unchanged  Goal: Patient-Specific Goal (Individualization)  Outcome: Ongoing - Unchanged  Goal: Absence of Hospital-Acquired Illness or Injury  Outcome: Ongoing - Unchanged  Goal: Optimal Comfort and Wellbeing  Outcome: Ongoing - Unchanged  Goal: Readiness for Transition of Care  Outcome: Ongoing - Unchanged  Goal: Rounds/Family Conference  Outcome: Ongoing - Unchanged     Problem: Infection  Goal: Infection Symptom Resolution  Outcome: Ongoing - Unchanged     Problem: Anemia (Chemotherapy Effects)  Goal: Anemia Symptom Improvement  Outcome: Ongoing - Unchanged     Problem: Urinary Bleeding Risk or Actual (Chemotherapy Effects)  Goal: Absence of Hematuria  Outcome: Ongoing - Unchanged     Problem: Nausea and Vomiting (Chemotherapy Effects)  Goal: Fluid and Electrolyte Balance  Outcome: Ongoing - Unchanged

## 2020-04-04 DIAGNOSIS — C9200 Acute myeloblastic leukemia, not having achieved remission: Secondary | ICD-10-CM | POA: Diagnosis not present

## 2020-04-04 DIAGNOSIS — D61818 Other pancytopenia: Secondary | ICD-10-CM | POA: Diagnosis not present

## 2020-04-04 DIAGNOSIS — Z5111 Encounter for antineoplastic chemotherapy: Secondary | ICD-10-CM | POA: Diagnosis not present

## 2020-04-04 DIAGNOSIS — R197 Diarrhea, unspecified: Secondary | ICD-10-CM | POA: Diagnosis not present

## 2020-04-04 LAB — HEPATIC FUNCTION PANEL
ALBUMIN: 2.7 g/dL — ABNORMAL LOW (ref 3.4–5.0)
ALKALINE PHOSPHATASE: 72 U/L (ref 46–116)
ALT (SGPT): 7 U/L — ABNORMAL LOW (ref 10–49)
AST (SGOT): 33 U/L (ref <=34)
BILIRUBIN DIRECT: 0.2 mg/dL (ref 0.00–0.30)

## 2020-04-04 LAB — BASIC METABOLIC PANEL
ANION GAP: 5 mmol/L (ref 5–14)
ANION GAP: 8 mmol/L (ref 5–14)
BLOOD UREA NITROGEN: 45 mg/dL — ABNORMAL HIGH (ref 9–23)
BLOOD UREA NITROGEN: 44 mg/dL — ABNORMAL HIGH (ref 9–23)
BUN / CREAT RATIO: 31
BUN / CREAT RATIO: 33
CALCIUM: 8.6 mg/dL — ABNORMAL LOW (ref 8.7–10.4)
CALCIUM: 8.3 mg/dL — ABNORMAL LOW (ref 8.7–10.4)
CHLORIDE: 103 mmol/L (ref 98–107)
CHLORIDE: 102 mmol/L (ref 98–107)
CO2: 21 mmol/L (ref 20.0–31.0)
CREATININE: 1.32 mg/dL — ABNORMAL HIGH
EGFR CKD-EPI AA MALE: 61 mL/min/{1.73_m2} (ref >=60)
EGFR CKD-EPI NON-AA MALE: 47 mL/min/{1.73_m2} — ABNORMAL LOW (ref >=60)
EGFR CKD-EPI NON-AA MALE: 53 mL/min/{1.73_m2} — ABNORMAL LOW (ref >=60)
GLUCOSE RANDOM: 88 mg/dL (ref 70–179)
GLUCOSE RANDOM: 85 mg/dL (ref 70–179)
POTASSIUM: 4.9 mmol/L — ABNORMAL HIGH (ref 3.4–4.5)
POTASSIUM: 4.6 mmol/L — ABNORMAL HIGH (ref 3.4–4.5)
SODIUM: 129 mmol/L — ABNORMAL LOW (ref 135–145)
SODIUM: 131 mmol/L — ABNORMAL LOW (ref 135–145)

## 2020-04-04 LAB — POTASSIUM: Potassium:SCnc:Pt:Ser/Plas:Qn:: 4.6 — ABNORMAL HIGH

## 2020-04-04 LAB — CBC W/ AUTO DIFF
HEMATOCRIT: 34.4 % — ABNORMAL LOW (ref 41.0–53.0)
HEMOGLOBIN: 10.4 g/dL — ABNORMAL LOW (ref 13.5–17.5)
MEAN CORPUSCULAR HEMOGLOBIN: 27.6 pg (ref 26.0–34.0)
MEAN CORPUSCULAR VOLUME: 91.1 fL (ref 80.0–100.0)
MEAN PLATELET VOLUME: 8.5 fL (ref 7.0–10.0)
NUCLEATED RED BLOOD CELLS: 4 /100{WBCs} (ref <=4)
PLATELET COUNT: 34 10*9/L — ABNORMAL LOW (ref 150–440)
RED BLOOD CELL COUNT: 3.77 10*12/L — ABNORMAL LOW (ref 4.50–5.90)
WBC ADJUSTED: 5.6 10*9/L (ref 4.5–11.0)

## 2020-04-04 LAB — BILIRUBIN DIRECT: Bilirubin.glucuronidated+Bilirubin.albumin bound:MCnc:Pt:Ser/Plas:Qn:: 0.2

## 2020-04-04 LAB — MANUAL DIFFERENTIAL
BASOPHILS - ABS (DIFF): 0.1 10*9/L (ref 0.0–0.1)
BASOPHILS - REL (DIFF): 1 %
BLASTS - REL (DIFF): 11 % (ref <=0)
EOSINOPHILS - ABS (DIFF): 0.1 10*9/L (ref 0.0–0.4)
LYMPHOCYTES - ABS (DIFF): 0.5 10*9/L — ABNORMAL LOW (ref 1.5–5.0)
LYMPHOCYTES - REL (DIFF): 9 %
MONOCYTES - ABS (DIFF): 0 10*9/L — ABNORMAL LOW (ref 0.2–0.8)
MONOCYTES - REL (DIFF): 0 %
NEUTROPHILS - ABS (DIFF): 4.4 10*9/L (ref 2.0–7.5)
NEUTROPHILS - REL (DIFF): 78 %

## 2020-04-04 LAB — URIC ACID
Urate:MCnc:Pt:Ser/Plas:Qn:: 4
Urate:MCnc:Pt:Ser/Plas:Qn:: 4.3

## 2020-04-04 LAB — RED BLOOD CELL COUNT: Lab: 3.77 — ABNORMAL LOW

## 2020-04-04 LAB — NEUTROPHILS - REL (DIFF): Neutrophils/100 leukocytes:NFr:Pt:Bld:Qn:Manual count: 78

## 2020-04-04 LAB — PHOSPHORUS
Phosphate:MCnc:Pt:Ser/Plas:Qn:: 5.4 — ABNORMAL HIGH
Phosphate:MCnc:Pt:Ser/Plas:Qn:: 5.9 — ABNORMAL HIGH

## 2020-04-04 LAB — MAGNESIUM: Magnesium:MCnc:Pt:Ser/Plas:Qn:: 2.2

## 2020-04-04 LAB — LACTATE DEHYDROGENASE: Lactate dehydrogenase:CCnc:Pt:Ser/Plas:Qn:Reaction: pyruvate to lactate: 496 — ABNORMAL HIGH

## 2020-04-04 LAB — CALCIUM: Calcium:MCnc:Pt:Ser/Plas:Qn:: 8.6 — ABNORMAL LOW

## 2020-04-04 MED ORDER — SEVELAMER CARBONATE 800 MG TABLET
ORAL_TABLET | Freq: Three times a day (TID) | ORAL | 0 refills | 7.00000 days | Status: CP
Start: 2020-04-04 — End: 2020-04-11
  Filled 2020-04-04: qty 21, 7d supply, fill #0

## 2020-04-04 MED ORDER — TRANEXAMIC ACID 650 MG TABLET
ORAL_TABLET | Freq: Three times a day (TID) | ORAL | 0 refills | 30.00000 days | Status: CP
Start: 2020-04-04 — End: 2020-05-04

## 2020-04-04 MED ADMIN — white petrolatum (VASELINE) jelly: TOPICAL | @ 13:00:00 | Stop: 2020-04-04

## 2020-04-04 MED ADMIN — timolol (TIMOPTIC) 0.5 % ophthalmic solution 1 drop: 1 [drp] | OPHTHALMIC | @ 13:00:00 | Stop: 2020-04-04

## 2020-04-04 MED ADMIN — latanoprost (XALATAN) 0.005 % ophthalmic solution 1 drop: 1 [drp] | OPHTHALMIC | @ 01:00:00

## 2020-04-04 MED ADMIN — allopurinoL (ZYLOPRIM) tablet 300 mg: 300 mg | ORAL | @ 01:00:00

## 2020-04-04 MED ADMIN — allopurinoL (ZYLOPRIM) tablet 300 mg: 300 mg | ORAL | @ 13:00:00 | Stop: 2020-04-04

## 2020-04-04 MED ADMIN — sodium bicarbonate tablet 1,300 mg: 1300 mg | ORAL | @ 13:00:00 | Stop: 2020-04-04

## 2020-04-04 MED ADMIN — sevelamer (RENVELA) tablet 2,400 mg: 2400 mg | ORAL | @ 16:00:00 | Stop: 2020-04-04

## 2020-04-04 MED ADMIN — valACYclovir (VALTREX) tablet 500 mg: 500 mg | ORAL | @ 13:00:00 | Stop: 2020-04-04

## 2020-04-04 MED ADMIN — micafungin (MYCAMINE) 100 mg in sodium chloride (NS) 0.9 % 100 mL IVPB: 100 mg | INTRAVENOUS | @ 13:00:00 | Stop: 2020-04-04

## 2020-04-04 MED ADMIN — tranexamic acid tablet 1,300 mg: 1300 mg | ORAL | @ 18:00:00 | Stop: 2020-04-04

## 2020-04-04 MED ADMIN — ondansetron (ZOFRAN) tablet 16 mg: 16 mg | ORAL | @ 18:00:00 | Stop: 2020-04-04

## 2020-04-04 MED ADMIN — sevelamer (RENVELA) tablet 2,400 mg: 2400 mg | ORAL | @ 13:00:00 | Stop: 2020-04-04

## 2020-04-04 MED ADMIN — finasteride (PROSCAR) tablet 5 mg: 5 mg | ORAL | @ 13:00:00 | Stop: 2020-04-04

## 2020-04-04 MED ADMIN — sodium bicarbonate tablet 1,300 mg: 1300 mg | ORAL | @ 18:00:00 | Stop: 2020-04-04

## 2020-04-04 MED FILL — TRANEXAMIC ACID 650 MG TABLET: 5 days supply | Qty: 30 | Fill #0 | Status: AC

## 2020-04-04 MED FILL — VALACYCLOVIR 500 MG TABLET: 30 days supply | Qty: 30 | Fill #0 | Status: AC

## 2020-04-04 MED FILL — SEVELAMER CARBONATE 800 MG TABLET: 7 days supply | Qty: 21 | Fill #0 | Status: AC

## 2020-04-04 NOTE — Unmapped (Signed)
Pt alert and oriented x4. Pt has been afebrile with stable VS. Pt with no c/o pain to his back, refused any medications for it. Pt w/ no c/o nausea. Pt w/ no further nose bleeds overnight. Pt with active bowel and good urine output, self caths himself. Pt w/ R FA PIV, flushes well, good blood return, med locked, changed lines. Falls precautions and pt safety maintained. Pt will be C1D3 of aza/venetoclax @ 1500. WCTM.      Problem: Adult Inpatient Plan of Care  Goal: Plan of Care Review  Outcome: Ongoing - Unchanged  Goal: Patient-Specific Goal (Individualization)  Outcome: Ongoing - Unchanged  Goal: Absence of Hospital-Acquired Illness or Injury  Outcome: Ongoing - Unchanged  Goal: Optimal Comfort and Wellbeing  Outcome: Ongoing - Unchanged  Goal: Readiness for Transition of Care  Outcome: Ongoing - Unchanged  Goal: Rounds/Family Conference  Outcome: Ongoing - Unchanged     Problem: Infection  Goal: Infection Symptom Resolution  Outcome: Ongoing - Unchanged     Problem: Anemia (Chemotherapy Effects)  Goal: Anemia Symptom Improvement  Outcome: Ongoing - Unchanged     Problem: Urinary Bleeding Risk or Actual (Chemotherapy Effects)  Goal: Absence of Hematuria  Outcome: Ongoing - Unchanged     Problem: Nausea and Vomiting (Chemotherapy Effects)  Goal: Fluid and Electrolyte Balance  Outcome: Ongoing - Unchanged

## 2020-04-04 NOTE — Unmapped (Signed)
Informed Blood Consent    I have advised Howard Villarreal of his medical condition, and that the chances for his improvement or recovery will be significantly helped by receiving blood products by transfusion:  Such as packed red blood cells, fresh frozen plasma, platelets or cryoprecipitate (blood products).    I have explained the benefits that are expected from the patient being transfused and, as well, the risk.  The patient understands that although the blood products to be administered had been prepared and tested in accordance with strict scientific rules established by the [employer] of Blood [employer], there is still a very small - approximately 1 and 70,000 for Hemolytic Reactions and approximately 1 and 190,000 for TRALI - chance of blood products could be incompatible with the patient's body and a transfusion reaction can occur.  Although transfusion reactions can be treated successfully, the patient understands that on rare occasions they can be fatal (1 in 250,000 transfusions).  The patient also understands that allergic reactions to blood products with hives, itching, and fever are more common but can be treated and may not even require the transfusion to be stopped.  The patient understands that even with testing by the most up-to-date methods, there is a small chance that the blood products may contain a virus that may not be recognized as an infection for many months or years.  Even with proper testing, I discussed with the patient that the chance for contracting viral Hepatitis B is approximately 1 in 200,000, Hepatitis C is approximately 1 in 2 million, and HIV is approximately 1 in 2 million.    I gave the patient an opportunity to ask questions regarding the transfusion of blood products and I answered those questions to the patient's satisfaction.    ______________________________________________________________________  Instructions for Nursing:  Nurse will indicate at the top of the consent form (page 1) the portions in BOLD:  1.  I authorize Dr. Doneen Poisson and/or associates and assistants of his/her choice at [place] [facility] (referred to herein as 'facility') to perform the following procedure(s): Transfusion of Blood Products.    The patient should place his initials in the area under #4 indicating Authorization of blood products.    The patient's signature (page 2) will be obtained by nursing staff on the consent form and the nurse will verify this consent under the Witness Certification (page 2).  The paper form will subsequently be placed in the patient's physical chart (later to be scanned) as further evidence that the patient has given consent to the administration of blood products.

## 2020-04-04 NOTE — Unmapped (Signed)
Speech Language Pathology Clinical Swallow Assessment  Evaluation (04/04/20 1455)    Patient Name:  Howard Villarreal       Medical Record Number: [id number]   Date of Birth: 12/14/1945  Sex: Male            SLP Treatment Diagnosis: r/o oropharyngeal dysphagia  Activity Tolerance: Patient tolerated treatment well    Assessment  Pt seen this PM for swallow evaluation following coughing incident with medications. Pt encountered upright in the chair - alert, well-oriented, cooperative and following commands. His wife was present in the room. Oral mechanism WFL. Speech and vocal quality WFL. PO trials included thin liquid and regular solids. No overt s/sx of aspiration noted with single or sequential sips of thin liquid, or with solids. Appropriate bolus manipulation/mastication. Recommend continuation of regular diet and thin liquids. Provided education regarding medications; recommend one pill at at time with water. No SLP services indicated at this time.  Risk for Aspiration: Mild     Recommendations:         Diet Liquids Recommendations: Thin Liquids, Level 0, No Restrictions    Diet Solids Recommendation: No Restrictions    Recommended Form of Medications: Whole, With liquid      Compensatory Swallowing Strategies: Other (Comment) (One pill at a time with water; discussed with patient)    Post Acute Discharge Recommendations  Post Acute SLP Discharge Recommendations: SLP services not indicated    Prognosis: Excellent  Positive Indicators: no h/o dysphagia, good insight, participation, family support  Barriers to Discharge: None     Plan of Care  SLP Follow-up / Frequency: D/C Services, D/C Services      Treatment Goals:      Patient and Family Goal: to d/c home    Subjective    Allergies: Patient has no known allergies.  Current Facility-Administered Medications   Medication Dose Route Frequency Provider Last Rate Last Admin   ??? allopurinoL (ZYLOPRIM) tablet 300 mg  300 mg Oral BID Herbert Moors, AGNP   300 mg at 04/04/20 5621   ??? azaCITIDine (VIDAZA) syringe  75 mg/m2 (Treatment Plan Recorded) Subcutaneous Q24H Wynn Banker, MD   144.75 mg at 04/04/20 1500   ??? cyanocobalamin (vitamin B-12) tablet 1,000 mcg  1,000 mcg Oral Daily Court Joy Churchwell, PA   1,000 mcg at 04/04/20 0914   ??? dextromethorphan-guaifenesin (ROBITUSSIN-DM) 2-20 mg/mL oral syrup  10 mL Oral Q4H PRN Janyth Contes, FNP   10 mL at 04/02/20 2318   ??? diphenhydrAMINE (BENADRYL) injection 25 mg  25 mg Intravenous Q4H PRN Wynn Banker, MD       ??? emollient combination no.92 (LUBRIDERM) lotion 1 application  1 application Topical Q1H PRN Herbert Moors, AGNP       ??? EPINEPHrine (EPIPEN) injection 0.3 mg  0.3 mg Intramuscular Daily PRN Wynn Banker, MD       ??? famotidine (PF) (PEPCID) injection 20 mg  20 mg Intravenous Q4H PRN Wynn Banker, MD       ??? finasteride (PROSCAR) tablet 5 mg  5 mg Oral Daily Herbert Moors, AGNP   5 mg at 04/04/20 3086   ??? IP OKAY TO TREAT   Other Continuous PRN Court Joy Churchwell, PA       ??? latanoprost (XALATAN) 0.005 % ophthalmic solution 1 drop  1 drop Both Eyes Nightly Herbert Moors, AGNP   1 drop at 04/03/20 2034   ??? lidocaine (LIDODERM) 5 % patch 1  patch  1 patch Transdermal Daily PRN Janyth Contes, FNP   1 patch at 04/02/20 2318   ??? loperamide (IMODIUM) capsule 2 mg  2 mg Oral Q2H PRN Herbert Moors, AGNP       ??? loperamide (IMODIUM) capsule 4 mg  4 mg Oral Once PRN Herbert Moors, AGNP       ??? meperidine (DEMEROL) injection 25 mg  25 mg Intravenous Q30 Min PRN Wynn Banker, MD       ??? methylPREDNISolone sodium succinate (PF) (Solu-MEDROL) injection 125 mg  125 mg Intravenous Q4H PRN Wynn Banker, MD       ??? micafungin (MYCAMINE) 100 mg in sodium chloride (NS) 0.9 % 100 mL IVPB  100 mg Intravenous Q24H [facility] Herbert Moors, [place]   Stopped at 04/04/20 1052   ??? ondansetron (ZOFRAN) tablet 16 mg  16 mg Oral Q24H Wynn Banker, MD   16 mg at 04/04/20 1425   ??? oxymetazoline (AFRIN) 0.05 % nasal spray 2 spray  2 spray Each Nare BID PRN Army Chaco Vogler, AGNP   2 spray at 04/03/20 1418   ??? sevelamer (RENVELA) tablet 2,400 mg  2,400 mg Oral 3xd [facility], AGNP   2,400 mg at 04/04/20 1210   ??? sodium bicarbonate tablet 1,300 mg  1,300 mg Oral TID [facility], AGNP   1,300 mg at 04/04/20 1425   ??? sodium chloride (NS) 0.9 % infusion  20 mL/hr Intravenous Continuous PRN Wynn Banker, MD       ??? sodium chloride (OCEAN) 0.65 % nasal spray 1 spray  1 spray Each Nare Q6H PRN Ernie Hew, AGNP       ??? sodium chloride 0.9% (NS) bolus 1,000 mL  1,000 mL Intravenous Daily PRN Wynn Banker, MD       ??? timolol (TIMOPTIC) 0.5 % ophthalmic solution 1 drop  1 drop Both Eyes Daily Herbert Moors, AGNP   1 drop at 04/04/20 0915   ??? traMADoL (ULTRAM) tablet 50 mg  50 mg Oral Q8H PRN Janyth Contes, FNP   50 mg at 04/02/20 2300   ??? tranexamic acid tablet 1,300 mg  1,300 mg Oral TID Beryle Beams, PA   1,300 mg at 04/04/20 1425   ??? valACYclovir (VALTREX) tablet 500 mg  500 mg Oral Daily Herbert Moors, AGNP   500 mg at 04/04/20 1610   ??? venetoclax (VENCLEXTA) tablet 400 mg  400 mg Oral Q24H Wynn Banker, MD   400 mg at 04/03/20 1823   ??? white petrolatum (VASELINE) jelly   Topical BID Herbert Moors, [place]   Given at 04/04/20 9604     Past Medical History:   Diagnosis Date   ??? 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 CKD (chronic kidney disease) stage 3, GFR 30-59 ml/min    ??? Crohn's colitis (CMS-HCC) 1986    No surgeries. Was on 6-MP for awhile; hasn't been on anything 2005.   ??? Glaucoma    ??? 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      History reviewed. No pertinent family history.  Past Surgical History:   Procedure Laterality Date   ??? MOHS SURGERY  2016    left ear for non-melanoma skin cancer     Social History     Tobacco Use   ??? Smoking status: Former Smoker   ??? Smokeless tobacco:  Never Used   Substance Use Topics   ??? Alcohol use: Yes     Alcohol/week: 4.0 standard drinks     Types: 4 Glasses of wine per week     Comment: occasional          General:  SLP Treatment Diagnosis: r/o oropharyngeal dysphagia  Current Functional Status: Howard Villarreal is an 74 y.o. 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 RN reported the patient had a choking incident when taking medications on 7/14. Pt seen this PM for swallow evaluation to r/o aspiration prior to d/c.  Activity Tolerance: Patient tolerated treatment well  Communication Preference: Verbal  Medical Tests / Procedures Comments: CXR 7/12; Bilateral patchy airspace opacities more confluent in the right lung, likely pneumonia. Recommend follow-up chest radiograph after treatment in 2-3 weeks to document resolution.  Equipment/Environment: Caregiver not wearing mask for full session, Other (SLP donned surgical mask and protective eyewear)  Patient/Caregiver Reports: Pt reported taking too many pills at once  Pain: no s/s of pain                    Vision: Functional for self-feeding                   Precautions: Non-applicable  Weight Bearing Status: Non-applicable  Required Braces or Orthoses: Non-applicable    Lives With: Spouse      Objective     Respiratory Status : Room air  History of Intubation: No     Behavior/Cognition: Alert, Cooperative, Pleasant mood  Positioning : Upright in chair    Oral / Motor Exam  Vocal Quality: Normal  Volitional Swallow: Within Functional Limits   Labial ROM: Within Functional Limits   Labial Symmetry: Within Functional Limits  Labial Strength: Within Functional Limits   Lingual ROM: Within Functional Limits  Lingual Symmetry: Within Functional Limits  Lingual Strength: Within Functional Limits   Lingual Sensation: Within Functional Limits Velum: Within Functional Limits   Mandible: Within Functional Limits     Facial ROM: Within Functional Limits   Facial Symmetry: Within Functional Limits  Facial Strength: Within Functional Limits      Vocal Intensity: Within Functional Limits   Gag: Within Functional Limits   Apraxia: None present   Dysarthria: None present   Intelligibility: Intelligible   Breath Support: Adequate for speech   Dentition: Adequate    Consistencies assessed: Thin liquid, regular solid    Medical Staff Made Aware: RN    Speech Therapy Session Duration  SLP Individual [mins]: 10    I attest that I have reviewed the above information.  Signed: Eugene Gavia, SLP    Ceasar Mons 04/04/2020

## 2020-04-04 NOTE — Unmapped (Signed)
Pt alert and orientedx4, VSS, afebrile during shift. Cycle 1 Day 3 completed. Pt continuing to self I/O cath. Platelets administered. Speech eval ordered as pt had an episode of coughing during morning medications. No c/o pain, nausea, or other discomforts. Wife at bedside. Discharge education provided, questions answered, and pt home with wife. Follow up appointments scheduled and medication education provided by pharmacist. Will continue to monitor.     Problem: Adult Inpatient Plan of Care  Goal: Plan of Care Review  Outcome: Discharged to Home  Goal: Patient-Specific Goal (Individualization)  Outcome: Discharged to Home  Goal: Absence of Hospital-Acquired Illness or Injury  Outcome: Discharged to Home  Goal: Optimal Comfort and Wellbeing  Outcome: Discharged to Home  Goal: Readiness for Transition of Care  Outcome: Discharged to Home  Goal: Rounds/Family Conference  Outcome: Discharged to Home

## 2020-04-04 NOTE — Unmapped (Signed)
Pt alert and oriented x4. Pt has been afebrile with stable VS. Pt with no c/o pain. Pt with active bowels and good urine output. Pt had a nose bleed this afternoon; MD team notified. Nose bleed controlled after using Quik clot. Pt got chemo at 1500. Chemo precautions and safety maintained. Will continue to monitor.        Problem: Adult Inpatient Plan of Care  Goal: Plan of Care Review  Outcome: Ongoing - Unchanged  Goal: Patient-Specific Goal (Individualization)  Outcome: Ongoing - Unchanged  Goal: Absence of Hospital-Acquired Illness or Injury  Outcome: Ongoing - Unchanged  Goal: Optimal Comfort and Wellbeing  Outcome: Ongoing - Unchanged  Goal: Readiness for Transition of Care  Outcome: Ongoing - Unchanged  Goal: Rounds/Family Conference  Outcome: Ongoing - Unchanged     Problem: Infection  Goal: Infection Symptom Resolution  Outcome: Ongoing - Unchanged     Problem: Anemia (Chemotherapy Effects)  Goal: Anemia Symptom Improvement  Outcome: Ongoing - Unchanged     Problem: Urinary Bleeding Risk or Actual (Chemotherapy Effects)  Goal: Absence of Hematuria  Outcome: Ongoing - Unchanged     Problem: Nausea and Vomiting (Chemotherapy Effects)  Goal: Fluid and Electrolyte Balance  Outcome: Ongoing - Unchanged

## 2020-04-05 ENCOUNTER — Ambulatory Visit: Admit: 2020-04-05 | Discharge: 2020-04-06 | Payer: MEDICARE

## 2020-04-05 ENCOUNTER — Encounter: Payer: Self-pay | Admitting: Emergency Medicine

## 2020-04-05 ENCOUNTER — Other Ambulatory Visit: Payer: Self-pay

## 2020-04-05 ENCOUNTER — Emergency Department
Admission: EM | Admit: 2020-04-05 | Discharge: 2020-04-05 | Disposition: A | Discharge status: Home / Self Care | Payer: Medicare Other | Attending: Emergency Medicine | Admitting: Emergency Medicine

## 2020-04-05 DIAGNOSIS — R040 Epistaxis: Secondary | ICD-10-CM | POA: Diagnosis not present

## 2020-04-05 DIAGNOSIS — N1831 Chronic kidney disease, stage 3a: Secondary | ICD-10-CM | POA: Diagnosis not present

## 2020-04-05 DIAGNOSIS — D696 Thrombocytopenia, unspecified: Secondary | ICD-10-CM | POA: Diagnosis not present

## 2020-04-05 DIAGNOSIS — Z79899 Other long term (current) drug therapy: Secondary | ICD-10-CM | POA: Diagnosis not present

## 2020-04-05 DIAGNOSIS — Z87891 Personal history of nicotine dependence: Secondary | ICD-10-CM | POA: Insufficient documentation

## 2020-04-05 DIAGNOSIS — C9200 Acute myeloblastic leukemia, not having achieved remission: Secondary | ICD-10-CM | POA: Diagnosis not present

## 2020-04-05 LAB — CBC
HEMATOCRIT: 36.4 % — ABNORMAL LOW (ref 41.0–53.0)
HEMOGLOBIN: 10.9 g/dL — ABNORMAL LOW (ref 13.5–17.5)
MEAN CORPUSCULAR HEMOGLOBIN CONC: 30.1 g/dL — ABNORMAL LOW (ref 31.0–37.0)
MEAN CORPUSCULAR VOLUME: 92.1 fL (ref 80.0–100.0)
MEAN PLATELET VOLUME: 11.6 fL — ABNORMAL HIGH (ref 7.0–10.0)
PLATELET COUNT: 34 10*9/L — ABNORMAL LOW (ref 150–440)
RED BLOOD CELL COUNT: 3.95 10*12/L — ABNORMAL LOW (ref 4.50–5.90)
RED CELL DISTRIBUTION WIDTH: 23.3 % — ABNORMAL HIGH (ref 12.0–15.0)
WBC ADJUSTED: 6.2 10*9/L (ref 4.5–11.0)

## 2020-04-05 LAB — PLATELET COUNT
Platelets:NCnc:Pt:Bld:Qn:Automated count: 34 — ABNORMAL LOW
Platelets:NCnc:Pt:Bld:Qn:Automated count: 40 — ABNORMAL LOW

## 2020-04-05 MED ORDER — VALACYCLOVIR 500 MG TABLET
ORAL_TABLET | Freq: Every day | ORAL | 11 refills | 30.00000 days | Status: CP
Start: 2020-04-05 — End: ?
  Filled 2020-04-04 – 2020-05-04 (×2): qty 30, 30d supply, fill #0

## 2020-04-05 MED ORDER — TRANEXAMIC ACID 650 MG TABLET
ORAL_TABLET | Freq: Three times a day (TID) | ORAL | 0 refills | 7.00000 days | Status: CP
Start: 2020-04-05 — End: 2020-04-12
  Filled 2020-04-04: qty 30, 5d supply, fill #0

## 2020-04-05 MED ORDER — TRANEXAMIC ACID FOR EPISTAXIS
500.0000 mg | Freq: Once | TOPICAL | Status: AC
  Administered 2020-04-05: 500 mg via TOPICAL
  Filled 2020-04-05: qty 10

## 2020-04-05 MED ADMIN — azaCITIDine (VIDAZA) syringe: 75 mg/m2 | SUBCUTANEOUS | @ 16:00:00 | Stop: 2020-04-05

## 2020-04-05 MED ADMIN — ondansetron (ZOFRAN) tablet 8 mg: 8 mg | ORAL | @ 14:00:00 | Stop: 2020-04-05

## 2020-04-05 NOTE — Unmapped (Signed)
Infusion Center Progress Note    Patient Name: Howard Villarreal  Patient Age: 74 y.o.  Encounter Date: 04/05/2020    Reason for visit  Chief Complaint: overnight nosebleed   Today is cycle 1/ day 5 of therapy.     Assessment/Plan:   I have discussed the case (exam, laboratory results, and plan) with Howard Jarvis, NP. Patient and team are all in agreement with plan outlined below.    Epistaxis requiring ED visit-  --increase platelet transfusion threshold to 50k  --continue tranexamic acid PO TID, RX sent to pharmacy as on discharge only provided 5 day supply  --recommend OTC Afrin and hold pressure with reoccurence(s)- clarified which Afrin for Howard Villarreal  --ok to discharge today pending repeat platelet count, will transfuse as needed again tomorrow.   --CBC, CMP ordered for 04/06/20    Subjective/HPI:  Howard Villarreal is a 74 y.o. male patient with     Review of Systems:  Review of Systems   Constitutional: Positive for fatigue. Negative for chills and fever.   HENT:   Positive for nosebleeds. Negative for mouth sores and trouble swallowing.    Eyes: Negative.    Respiratory: Positive for cough. Negative for shortness of breath.    Cardiovascular: Positive for leg swelling. Negative for chest pain.   Gastrointestinal: Positive for diarrhea.        Not taking imodium    Genitourinary:         No changes with I/O catheterizations    Skin:        Has redness on right arm from PIV infiltration. States swelling has improved.    Neurological: Positive for extremity weakness.        General lower extremity weakness, still ambulatory   Hematological: Bruises/bleeds easily.        History nose bleeds, last in hospital on 7/13. Left with scant bleeding. Large volume nose bleed last night.    Psychiatric/Behavioral: Negative.        Oncology History:  Oncology History   Acute myeloid leukemia not having achieved remission (CMS-HCC)   03/06/2020 Initial Diagnosis    Acute myeloid leukemia not having achieved remission (CMS-HCC) 03/13/2020 - 03/13/2020 Chemotherapy    OP AML AZACITIDINE + VENETOCLAX  azacitidine 75 mg/m2 SQ on days 1-7, venetoclax ramp up week 1; dose dependent, then azacitidine 75 mg/m2 SQ on days 1-7 every 28 days.     04/01/2020 -  Chemotherapy    IP/OP AML AZACITIDINE + VENETOCLAX  azacitidine 75 mg/m2 SQ on days 1-7, venetoclax ramp up week 1; dose dependent, then azacitidine 75 mg/m2 SQ on days 1-7 every 28 days.         Allergies:  No Known Allergies    Medications:  Reviewed in the EMR    Physical Exam:  BP 146/73  - Pulse 71  - Temp 36.5 ??C (97.7 ??F) (Oral)  - Resp 17  - Wt 71 kg (156 lb 9.6 oz)  - BMI 21.24 kg/m??     Physical Exam      Results:  Hospital Outpatient Visit on 04/05/2020   Component Date Value Ref Range Status   ??? WBC 04/05/2020 6.2  4.5 - 11.0 10*9/L Final   ??? RBC 04/05/2020 3.95* 4.50 - 5.90 10*12/L Final   ??? HGB 04/05/2020 10.9* 13.5 - 17.5 g/dL Final   ??? HCT 16/06/9603 36.4* 41.0 - 53.0 % Final   ??? MCV 04/05/2020 92.1  80.0 - 100.0 fL Final   ??? MCH  04/05/2020 27.7  26.0 - 34.0 pg Final   ??? MCHC 04/05/2020 30.1* 31.0 - 37.0 g/dL Final   ??? RDW 43/32/9518 23.3* 12.0 - 15.0 % Final   ??? MPV 04/05/2020 11.6* 7.0 - 10.0 fL Final   ??? Platelet 04/05/2020 34* 150 - 440 10*9/L Final   ??? ABO Grouping 04/05/2020 O POS   Final   ??? Antibody Screen 04/05/2020 NEG   Final       I spent a total of 35 minutes in the care of this patient.     Lavonda Jumbo, AGNP-BC  [facility] He is not toxic-appearing.      Comments: Sitting in chair, appears tired   HENT:      Nose:      Right Nostril: No epistaxis.      Left Nostril: No epistaxis.      Comments: No active bleeding or drainage, dried blood noted around bilateral nares  Cardiovascular:      Rate and Rhythm: Normal rate and regular rhythm.      Heart sounds: Normal heart sounds. No murmur heard.   No friction rub. No gallop.    Pulmonary:      Effort: Pulmonary effort is normal.      Breath sounds: Normal breath sounds. No decreased breath sounds, wheezing, rhonchi or rales.   Abdominal:      General: Bowel sounds are normal.      Palpations: Abdomen is soft.      Tenderness: There is no abdominal tenderness.   Musculoskeletal:      Right lower leg: 1+ Pitting Edema present.      Left lower leg: 1+ Pitting Edema present.   Skin:     General: Skin is warm and dry.      Comments: Diffuse pink, dry, peeling skin across upper buttock and bilateral mid back   Neurological:      General: No focal deficit present.      Mental Status: He is alert and oriented to person, place, and time.      Motor: Motor function is intact.      Gait: Gait is intact.   Psychiatric:         Speech: Speech normal.         Behavior: Behavior normal. Behavior is cooperative.         Results:  Hospital Outpatient Visit on 04/05/2020   Component Date Value Ref Range Status   ??? WBC 04/05/2020 6.2  4.5 - 11.0 10*9/L Final   ??? RBC 04/05/2020 3.95* 4.50 - 5.90 10*12/L Final   ??? HGB 04/05/2020 10.9* 13.5 - 17.5 g/dL Final   ??? HCT 84/16/6063 36.4* 41.0 - 53.0 % Final   ??? MCV 04/05/2020 92.1  80.0 - 100.0 fL Final   ??? MCH 04/05/2020 27.7  26.0 - 34.0 pg Final   ??? MCHC 04/05/2020 30.1* 31.0 - 37.0 g/dL Final   ??? RDW 01/60/1093 23.3* 12.0 - 15.0 % Final   ??? MPV 04/05/2020 11.6* 7.0 - 10.0 fL Final   ??? Platelet 04/05/2020 34* 150 - 440 10*9/L Final   ??? ABO Grouping 04/05/2020 O POS   Final   ??? Antibody Screen 04/05/2020 NEG   Final       I spent a total of 35 minutes in the care of this patient.     Lavonda Jumbo, AGNP-BC  [facility]

## 2020-04-05 NOTE — Unmapped (Unsigned)
1020 pt arrived to chair 26 for injection and one pack of platelets.  Tolerated well.  PIV flushed and left in place for tomorrows lab check and pt ambulated from clinic independently.

## 2020-04-05 NOTE — Unmapped (Signed)
Pharmacist Discharge Note  Patient Name: Howard Villarreal  Reason for admission: AML  Reason for writing this note: new diagnosis with new medication, barriers to medication access    AML:  - s/p treatment with Azacitidine + Venetoclax, D1 = 04/01/2020    Highlighted medication changes with rationale (if applicable):  - Started Venetoclax 400 mg qday  - Start tranexamic acid for nose bleeds despite platelet transfusions  - Start sevelamer for hyperphosphatemia  - Started valtrex for prophylaxis    Medication access:  - Prior authorization completed for Posaconazole. Estimated copay $99. Copay assistance in process. Med not yet dispensed.   - Prior authorization submitted and pending for tranexamic acid. Patient dispensed 5 day supply while awaiting approval for coverage. Covermymeds Key: [id number]    Outpatient follow-up:  [ ]  Medication access: Posaconazole and tranexamic acid   [ ]  Will need to dose reduce venetoclax to 100 mg once posaconazole starts.  [ ]  Phosphorus - may be able to discontinue sevelamer soon  [ ]  Consider CNS screening LP with IT chemotherapy for AML with initial WBC >40k     Oris Drone  Clinical Pharmacist    Future Appointments   Date Time Provider Department Center   04/06/2020 12:00 PM ONCINF CHAIR 15 [id number]   04/07/2020 11:00 AM ONCINF CHAIR 05 [id number]   04/09/2020 12:45 PM ADULT ONC LAB [facility]   04/09/2020  1:30 PM ONCINF CHAIR 05 [id number]   04/11/2020 12:00 PM ADULT ONC LAB [facility]   04/11/2020  1:00 PM Kaitlyn Marie Buhlinger, CPP [id number]   04/11/2020  2:00 PM Adine Madura Sawchak, AGNP [id number]   04/13/2020 11:15 AM ADULT ONC LAB [facility]   04/13/2020 12:00 PM ONCINF CHAIR 23 [facility]   04/17/2020 11:30 AM ADULT ONC LAB [facility]   04/17/2020 12:30 PM ONCINF CHAIR 22 [id number]   04/18/2020  9:30 AM ADULT ONC LAB [facility]   04/18/2020 10:30 AM Adine Madura Sawchak, AGNP [facility]   04/18/2020 11:00 AM Malva Cogan, CPP [facility]   04/20/2020 11:30 AM ADULT ONC [facility]   04/20/2020 12:30 PM ONCINF CHAIR 15 [facility]   04/23/2020 11:15 AM ADULT ONC LAB [facility]   04/23/2020 12:00 PM Kaitlyn Marie Buhlinger, CPP [facility]   04/23/2020  1:00 PM Vernie Murders, AGNP [facility]   04/24/2020 11:30 AM ADULT ONC LAB [facility]   04/24/2020 12:30 PM ONCINF CHAIR 20 [facility]   04/27/2020  1:00 PM ADULT ONC LAB [facility]   04/27/2020  2:00 PM Scherrie Gerlach Sadiq, FNP [id number]   04/27/2020  3:00 PM ONCINF CHAIR 28 [id number]

## 2020-04-05 NOTE — Unmapped (Signed)
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 He was started on Hydrea on admission and was leukoreduced fpr, 55k to 23k when chemo started on 7/11. Azacitidine injections and Venetoclax were well tolerated with the exception of mild TLS, diarrhea, epistaxis x 1 and mild hyponatremia.  TLS was resolving and diarrhea improving at time of discharge; he will continue on TLS ppx meds until f/u in clinic next week.  For his PNA, ICID was consulted and he completed a 5 day course of Ceftriaxone and Azithromycin on 7/13. He was afebrile prior to discharge. Posaconazole is in the process of approval, and should be started next week at his clinic visit.  He received a unit of platelets prior to discharge.  He has D5-7 Azacitidine injections scheduled 7/15, 7/16 and 7/16. Labs and transfusion support are scheduled for 7/16 and 7/19. He has f/u in clinic with [name] NP and [name] pharmD on 7/21. He will continue Venetoclax 400 mg daily until his next clinic visit.  He will continue on Allopurinol 150 mg daily, Sevelamer 800 mg TID, home dose sodium bicarb 1 tab BID, and Valtrex ppx at his next clinic visit.  TXA Rx was provided for 5 days; if epistaxis a problem this may need to be continued at his clinic visit. Detailed hospital course below.  ??  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 Initally treated with intermittent phlebotomy, ruxonlitinib and hydrea and most recently Fedratinib which he was stopped. He is followed locally by Dr. Stacey Drain [facility]). 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 Cytogenetics were normal (46, XY). He underwent another BMBx on 04/07/16 which revealed >95% cellular marrow with 5% blasts by manual aspirate differential, mild reticulin fibrosis, mild dyserythropoiesis with molecular mutations in:??JAK2 V617F, IDH2, RUNX1, SRSF2. He had labs done recently at [facility] which were concerning for acute leukemia (AML) based on falling hemoglobin and 26% blasts reportedly seen from peripheral blood. He underwent a BMBx on 02/28/20 which confirmed AML progression with 22% myeloblasts, unfortunately insufficient sample for flow. 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 Peripheral blood smear revealed 17% circulating myeloblasts with WBC 53.2k. He was started on Hydrea 500 mg BID in addition to allopurinol on 03/06/20. He discussed his treatment options with Dr. Anise Salvo with decision to focus on quality of life and pursue less intensive treatment with Aza/Ven. He was seen in clinic on 03/12/20 with WBC 67k and Hydrea was increased to 1000 mg BID with goal WBC <10k prior to treatment initiation. He requested his therapy be delayed ~2 weeks in order to spend time with family. On admission there is c/f fungal PNA and persistent hyperleukocytosis. ??His WBC on admission was 55k. He started C1 Azacitidine + Venetoclax on 7/11 with a starting WBC at 23k; Hydrea stopped at this time. This was well tolerated with the exception of mild TLS, diarrhea, epistaxis x 1 and mild hyponatremia.  TLS was resolving and diarrhea improving at time of discharge; he will continue on TLS ppx meds as below until f/u in  clinic next week.   - C1D1 = 7/11 Azacitidine subQ + Venetoclax 400 mg daily (final dose)   - Allopurinol 150 mg daily  - Sevelamer 800 mg TID   - cont Valtrex ppx; received Micafungin ppx while inpatient, stopped on day of discharge  [  ] Plan to transition to Posaconazole, decrease Venetoclax dose accordingly at clinic visit on 7/21  ??  Recent CAP:??Symptoms include cough, SOB/DOE and decreased exercise intolerance. He recently completed a course of Augmentin and Azithromycin with brief resolution of symptoms. He was seen in clinic on 6/15 and reported ongoing cough. He underwent a CT Chest at OSH on 6/28 which revealed multifocal GGO, Rt >Lt as well as associated consolidative opacity in RML with associated collapse as well as scattered tiny pulmonary nodules and bilateral trace pleural effusions. Incidental finding of massive splenomegaly with markedly increased size since prior CT A/P on 04/03/19. CT chest repeated at [facility] on 7/8 revealed multifocal RUL opacities; likely PNA. Small B pleural effusions. Splenomegaly and Rt hydronephrosis. ICID consulted on 7/8, recommendations below; signed off. CXR (7/12) consistent with pneumonia. Pt with coughing with multiple pills; SLP completed swallow eval on 7/14, ok for regular diet, thin liquids, take pills 1 at a time.  - Completed 5-day course of Ceftriaxone and Azithromycin (7/9-7/13)  - Fungal ppx as above  - Take pills 1 at a time   - Negative results: RPP/COVID, urine legionella, Serum Fungitell, Serum Cyrpto, urine histo, serum  Aspergillus Galactomannan Antigen  ??  Recent Epistaxis: Per chart review patient has history of nosebleeds requiring cauterization. Was recently seen by ENT with cautherization to??right nasal passage. Had epistaxis x1 on 7/13, resolved with packing/quik clot.  Received a unit of plts on day of discharge  - Consider increasing PLT threshold if difficult to control outpatient  - Vaseline and nasal saline for nasal dryness  - Tranexamic acid prescribed x 5 days on discharge, may need to be refilled at clinic visit, if helpful.  ??  Hx CKD:??Baseline SCr 1.5-1.8. Upon admission SCr 1.61. SCr below baseline with adequate hydration. ??  - Avoid nephrotoxic agents as able, Renally dose medications as indicated  - Resume home sodium bicarb dosing at discharge  ??  Hx Crohn's Disease, with diarrhea, improving:??Patient denies any abdominal pain, melena or BRBPR. He is not currently on any medical management. On 7/12, pt reported frequent diarrhea for several days.  Given recent antibiotic history, checked C. Diff, (negative). Mucositis SE of Hydrea could also be contributing to GI sx. Improving some on discharge  ??  Bladder Muscle Dysfunction  - I/O Cath per patient's home regimen  - Continued home Flomax  ??  Glaucoma: Continued Timolol and Latanoprost eye drops  ??  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

## 2020-04-05 NOTE — Unmapped (Signed)
Hospital Outpatient Visit on 04/05/2020   Component Date Value Ref Range Status   ??? WBC 04/05/2020 6.2  4.5 - 11.0 10*9/L Final   ??? RBC 04/05/2020 3.95* 4.50 - 5.90 10*12/L Final   ??? HGB 04/05/2020 10.9* 13.5 - 17.5 g/dL Final   ??? HCT 96/29/5284 36.4* 41.0 - 53.0 % Final   ??? MCV 04/05/2020 92.1  80.0 - 100.0 fL Final   ??? MCH 04/05/2020 27.7  26.0 - 34.0 pg Final   ??? MCHC 04/05/2020 30.1* 31.0 - 37.0 g/dL Final   ??? RDW 13/24/4010 23.3* 12.0 - 15.0 % Final   ??? MPV 04/05/2020 11.6* 7.0 - 10.0 fL Final   ??? Platelet 04/05/2020 34* 150 - 440 10*9/L Final   ??? ABO Grouping 04/05/2020 O POS   Final   ??? Antibody Screen 04/05/2020 NEG   Final         Please purchase Original Afrin(oxymetazoline) spray for nose bleeds.

## 2020-04-05 NOTE — Unmapped (Signed)
Physician Discharge Summary [facility]  4 ONC [facility]  [address]  [place] [address]  Dept: [phone]  Loc: [phone]     Identifying Information:   Howard Villarreal  09-21-1946  [id number]    Primary Care Physician: Malva Limes, MD     Referring Physician: [name]     Code Status: Full Code    Admit Date: 03/28/2020    Discharge Date: 04/04/2020     Discharge To: Home    Discharge Service: [facility] - Hematology APP Floor Team (MEDQ)     Discharge Attending Physician: Dr. 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 *      Outpatient Provider Follow Up Issues:   - Posaconazole is in the process of approval, and should be started next week at his clinic visit.    - He will continue Venetoclax 400 mg daily until his next clinic visit.  - He will continue on Allopurinol 150 mg daily, Sevelamer 800 mg TID, home dose sodium bicarb 1 tab BID, and Valtrex ppx at his next clinic visit.    - TXA Rx was provided for 5 days; if epistaxis a problem this may need to be refilled at his clinic visit.   - He has D5-7 Azacitidine injections scheduled 7/15, 7/16 and 7/16. Labs and transfusion support are scheduled for 7/16 and 7/19. He has f/u in clinic with [name] NP and [name] pharmD on 7/21.     Supportive Care Recommendations:  We recommend based on the patient???s underlying diagnosis and treatment history the following supportive care:    1. Antimicrobial prophylaxis: Valtrex 500 mg daily (not currently neutropenic). To start Posaconazole at 7/21 clinic visit    2. Blood product support:  Leukoreduced blood products are required.  Irradiated blood products are preferred, but in case of urgent transfusion needs non-irradiated blood products may be used:     -  RBC transfusion threshold: transfuse 1 units for Hgb < 7 g/dL.  -  Platelet transfusion threshold: transfuse 1 unit of platelets for platelet count < 10, or for bleeding or need for invasive procedure. 50k if bleeding.    Based on the patient's disease status and intensity of therapy, complete blood count with differential should be evaluated 2 times per week and used to guide transfusion support    3. 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 He was started on Hydrea on admission and was leukoreduced fpr, 55k to 23k when chemo started on 7/11. Azacitidine injections and Venetoclax were well tolerated with the exception of mild TLS, diarrhea, epistaxis x 1 and mild hyponatremia.  TLS was resolving and diarrhea improving at time of discharge; he will continue on TLS ppx meds until f/u in clinic next week.  For his PNA, ICID was consulted and he completed a 5 day course of Ceftriaxone and Azithromycin on 7/13. He was afebrile prior to discharge. Posaconazole is in the process of approval, and should be started next week at his clinic visit.  He received a unit of platelets prior to discharge.  He has D5-7 Azacitidine injections scheduled  7/15, 7/16 and 7/16. Labs and transfusion support are scheduled for 7/16 and 7/19. He has f/u in clinic with [name] NP and [name] pharmD on 7/21. He will continue Venetoclax 400 mg daily until his next clinic visit.  He will continue on Allopurinol 150 mg daily, Sevelamer 800 mg TID, home dose sodium bicarb 1 tab BID, and Valtrex ppx at his next clinic visit.  TXA Rx was provided for 5 days; if epistaxis a problem this may need to be continued at his clinic visit. Detailed hospital course below.  ??  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 Initally treated with intermittent phlebotomy, ruxonlitinib and hydrea and most recently Fedratinib which he was stopped. He is followed locally by Dr. Stacey Drain [facility]). 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 Cytogenetics were normal (46, XY). He underwent another BMBx on 04/07/16 which revealed >95% cellular marrow with 5% blasts by manual aspirate differential, mild reticulin fibrosis, mild dyserythropoiesis with molecular mutations in:??JAK2 V617F, IDH2, RUNX1, SRSF2. He had labs done recently at [facility] which were concerning for acute leukemia (AML) based on falling hemoglobin and 26% blasts reportedly seen from peripheral blood. He underwent a BMBx on 02/28/20 which confirmed AML progression with 22% myeloblasts, unfortunately insufficient sample for flow. 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 Peripheral blood smear revealed 17% circulating myeloblasts with WBC 53.2k. He was started on Hydrea 500 mg BID in addition to allopurinol on 03/06/20. He discussed his treatment options with Dr. Anise Salvo with decision to focus on quality of life and pursue less intensive treatment with Aza/Ven. He was seen in clinic on 03/12/20 with WBC 67k and Hydrea was increased to 1000 mg BID with goal WBC <10k prior to treatment initiation. He requested his therapy be delayed ~2 weeks in order to spend time with family. On admission there is c/f fungal PNA and persistent hyperleukocytosis. ??His WBC on admission was 55k. He started C1 Azacitidine + Venetoclax on 7/11 with a starting WBC at 23k; Hydrea stopped at this time. This was well tolerated with the exception of mild TLS, diarrhea, epistaxis x 1 and mild hyponatremia.  TLS was resolving and diarrhea improving at time of discharge; he will continue on TLS ppx meds as below until f/u in clinic next week.   - C1D1 = 7/11 Azacitidine subQ + Venetoclax 400 mg daily (final dose)   - Allopurinol 150 mg daily  - Sevelamer 800 mg TID   - cont Valtrex ppx; received Micafungin ppx while inpatient, stopped on day of discharge  [  ] Plan to transition to Posaconazole, decrease Venetoclax dose accordingly at clinic visit on 7/21  ??  Recent CAP:??Symptoms include cough, SOB/DOE and decreased exercise intolerance. He recently completed a course of Augmentin and Azithromycin with brief resolution of symptoms. He was seen in clinic on 6/15 and reported ongoing cough. He underwent a CT Chest at OSH on 6/28 which revealed multifocal GGO, Rt >Lt as well as associated consolidative opacity in RML with associated collapse as well as scattered tiny pulmonary nodules and bilateral trace pleural effusions. Incidental finding of massive splenomegaly with markedly increased size since prior CT A/P on 04/03/19. CT chest repeated at  [place] on 7/8 revealed multifocal RUL opacities; likely PNA. Small B pleural effusions. Splenomegaly and Rt hydronephrosis. ICID consulted on 7/8, recommendations below; signed off. CXR (7/12) consistent with pneumonia. Pt with coughing with multiple pills; SLP completed swallow eval on 7/14, ok for regular diet, thin liquids, take pills 1 at a time.  - Completed 5-day course of Ceftriaxone and Azithromycin (7/9-7/13)  - Fungal ppx as above  - Take pills 1 at a time   - Negative results: RPP/COVID, urine legionella, Serum Fungitell, Serum Cyrpto, urine histo, serum  Aspergillus Galactomannan Antigen  ??  Recent Epistaxis: Per chart review patient has history of nosebleeds requiring cauterization. Was recently seen by ENT with cautherization to??right nasal passage. Had epistaxis x1 on 7/13, resolved with packing/quik clot.  Received a unit of plts on day of discharge  - Consider increasing PLT threshold if difficult to control outpatient  - Vaseline and nasal saline for nasal dryness  - Tranexamic acid prescribed x 5 days on discharge, may need to be refilled at clinic visit, if helpful.  ??  Hx CKD:??Baseline SCr 1.5-1.8. Upon admission SCr 1.61. SCr below baseline with adequate hydration. ??  - Avoid nephrotoxic agents as able, Renally dose medications as indicated  - Resume home sodium bicarb dosing at discharge  ??  Hx Crohn's Disease, with diarrhea, improving:??Patient denies any abdominal pain, melena or BRBPR. He is not currently on any medical management. On 7/12, pt reported frequent diarrhea for several days.  Given recent antibiotic history, checked C. Diff, (negative). Mucositis SE of Hydrea could also be contributing to GI sx. Improving some on discharge  ??  Bladder Muscle Dysfunction  - I/O Cath per patient's home regimen  - Continued home Flomax  ??  Glaucoma: Continued Timolol and Latanoprost eye drops  ??  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  ______________________________________________________________________  Discharge Medications:     Your Medication List      STOP taking these medications    hydroxyurea 500 mg capsule  Commonly known as: HYDREA        START taking these medications    posaconazole 100 mg Tbec delayed released tablet  Commonly known as: NOXAFIL  Take 3 tablets (300 mg) by mouth daily.     sevelamer 800 mg tablet  Commonly known as: RENVELA  Take 1 tablet (800 mg total) by mouth Three (3) times a day with a meal for 7 days.     tranexamic acid 650 mg Tab tablet  Take 2 tablets (1,300 mg total) by mouth Three (3) times a day.     valACYclovir 500 MG tablet  Commonly known as: VALTREX  Take 1 tablet (500 mg total) by mouth daily.  Start taking on: April 05, 2020 CONTINUE taking these medications    acetaminophen 500 MG tablet  Commonly known as: TYLENOL  Take 500 mg by mouth every eight (8) hours as needed.     allopurinoL 300 MG tablet  Commonly known as: ZYLOPRIM  Take 150 mg by mouth daily.     CENTRUM SILVER ULTRA MEN'S 300-600-300 mcg Tab  Generic drug: multivit-min-FA-lycopen-lutein  Take 1 tablet by mouth daily.     cholecalciferol (vitamin D3 25 mcg (1,000 units)) 1,000 unit (25 mcg) tablet  Take 25 mcg by  mouth daily.     cyanocobalamin (vitamin B-12) 1000 MCG tablet  Take 1,000 mcg by mouth daily.     finasteride 5 mg tablet  Commonly known as: PROSCAR  Take 5 mg by mouth daily with evening meal.     fish oil-omega-3 fatty acids 300-1,000 mg capsule  Take 1 g by mouth daily.     folic acid 1 MG tablet  Commonly known as: FOLVITE  Take 1 mg by mouth daily.     latanoprost 0.005 % ophthalmic solution  Commonly known as: XALATAN  1 drop nightly.     sodium bicarbonate 650 mg tablet  Take 650 mg by mouth Two (2) times a day.     timolol 0.5 % ophthalmic solution  Commonly known as: TIMOPTIC  Apply 1 drop to eye daily.     VENCLEXTA 100 mg tablet  Generic drug: venetoclax  Take 4 tablets (400 mg total) by mouth daily. Take with a meal and water. Do not chew, crush, or break tablets.            Allergies:  Patient has no known allergies.  ______________________________________________________________________  Pending Test Results (if blank, then none):  Pending Labs     Order Current Status    Type and Screen Collected (03/28/20 1432)          Most Recent Labs:  All lab results last 24 hours -   Recent Results (from the past 24 hour(s))   Hepatic Function Panel    Collection Time: 04/04/20  5:52 AM   Result Value Ref Range    Albumin 2.7 (L) 3.4 - 5.0 g/dL    Total Protein 7.9 5.7 - 8.2 g/dL    Total Bilirubin 0.6 0.3 - 1.2 mg/dL    Bilirubin, Direct 1.61 0.00 - 0.30 mg/dL    AST 33 <=09 U/L    ALT 7 (L) 10 - 49 U/L    Alkaline Phosphatase 72 46 - 116 U/L Magnesium Level    Collection Time: 04/04/20  5:52 AM   Result Value Ref Range    Magnesium 2.2 1.6 - 2.6 mg/dL   Lactate dehydrogenase    Collection Time: 04/04/20  5:52 AM   Result Value Ref Range    LDH 496 (H) 120 - 246 U/L   Basic Metabolic Panel    Collection Time: 04/04/20  5:52 AM   Result Value Ref Range    Sodium 129 (L) 135 - 145 mmol/L    Potassium 4.9 (H) 3.4 - 4.5 mmol/L    Chloride 103 98 - 107 mmol/L    CO2 21.0 20.0 - 31.0 mmol/L    Anion Gap 5 5 - 14 mmol/L    BUN 45 (H) 9 - 23 mg/dL    Creatinine 6.04 (H) 0.60 - 1.10 mg/dL    BUN/Creatinine Ratio 31     EGFR CKD-EPI Non-African American, Male 47 (L) >=60 mL/min/1.56m2    EGFR CKD-EPI African American, Male 55 (L) >=60 mL/min/1.61m2    Glucose 88 70 - 179 mg/dL    Calcium 8.6 (L) 8.7 - 10.4 mg/dL   Phosphorus Level    Collection Time: 04/04/20  5:52 AM   Result Value Ref Range    Phosphorus 5.9 (H) 2.4 - 5.1 mg/dL   Uric acid    Collection Time: 04/04/20  5:52 AM   Result Value Ref Range    Uric Acid 4.3 3.7 - 9.2 mg/dL   CBC w/ Differential    Collection Time: 04/04/20  5:52 AM   Result Value Ref Range    WBC 5.6 4.5 - 11.0 10*9/L    RBC 3.77 (L) 4.50 - 5.90 10*12/L    HGB 10.4 (L) 13.5 - 17.5 g/dL    HCT 16.1 (L) 09.6 - 53.0 %    MCV 91.1 80.0 - 100.0 fL    MCH 27.6 26.0 - 34.0 pg    MCHC 30.3 (L) 31.0 - 37.0 g/dL    RDW 04.5 (H) 40.9 - 15.0 %    MPV 8.5 7.0 - 10.0 fL    Platelet 34 (L) 150 - 440 10*9/L    nRBC 4 <=4 /100 WBCs    Variable HGB Concentration Slight (A) Not Present    Neutrophil Left Shift 2+ (A) Not Present    Microcytosis Slight (A) Not Present    Macrocytosis Moderate (A) Not Present    Anisocytosis Marked (A) Not Present    Hypochromasia Marked (A) Not Present   Manual Differential    Collection Time: 04/04/20  5:52 AM   Result Value Ref Range    Neutrophils % 78 %    Lymphocytes % 9 %    Monocytes % 0 %    Eosinophils % 1 %    Basophils % 1 %    Blasts % 11 (HH) <=0 %    Absolute Neutrophils 4.4 2.0 - 7.5 10*9/L    Absolute Lymphocytes 0.5 (L) 1.5 - 5.0 10*9/L    Absolute Monocytes 0.0 (L) 0.2 - 0.8 10*9/L    Absolute Eosinophils 0.1 0.0 - 0.4 10*9/L    Absolute Basophils 0.1 0.0 - 0.1 10*9/L    Smear Review Comments See Comment (A) Undefined    Polychromasia Slight (A) Not Present    Hypersegmented Neutrophils Present (A) Not Present   Prepare Platelet Pheresis    Collection Time: 04/04/20  1:35 PM   Result Value Ref Range    Status Release/Cancel    Prepare Platelet Pheresis    Collection Time: 04/04/20  1:36 PM   Result Value Ref Range    Unit Blood Type O Pos     ISBT Number 5100     Unit # [id number]     Status Issued     Product ID Platelets     PRODUCT CODE [id number]    Basic Metabolic Panel    Collection Time: 04/04/20  2:32 PM   Result Value Ref Range    Sodium 131 (L) 135 - 145 mmol/L    Potassium 4.6 (H) 3.4 - 4.5 mmol/L    Chloride 102 98 - 107 mmol/L    CO2 21.0 20.0 - 31.0 mmol/L    Anion Gap 8 5 - 14 mmol/L    BUN 44 (H) 9 - 23 mg/dL    Creatinine 4.69 (H) 0.60 - 1.10 mg/dL    BUN/Creatinine Ratio 33     EGFR CKD-EPI Non-African American, Male 53 (L) >=60 mL/min/1.2m2    EGFR CKD-EPI African American, Male 85 >=60 mL/min/1.51m2    Glucose 85 70 - 179 mg/dL    Calcium 8.3 (L) 8.7 - 10.4 mg/dL   Phosphorus Level    Collection Time: 04/04/20  2:32 PM   Result Value Ref Range    Phosphorus 5.4 (H) 2.4 - 5.1 mg/dL   Uric acid    Collection Time: 04/04/20  2:32 PM   Result Value Ref Range    Uric Acid 4.0 3.7 - 9.2 mg/dL       Relevant Studies/Radiology (if blank,  then none):  ECG 12 Lead    Result Date: 04/04/2020  SINUS RHYTHM WITH 1ST DEGREE AV BLOCK INDETERMINATE AXIS RIGHT BUNDLE BRANCH BLOCK ABNORMAL ECG NO PREVIOUS ECGS AVAILABLE Confirmed by Warnell Forester ([id number]) on 04/04/2020 1:17:23 AM    XR Chest Portable    Result Date: 04/02/2020  EXAM: XR CHEST PORTABLE DATE: 04/02/2020 10:13 PM ACCESSION: [id number] UN DICTATED: 04/02/2020 10:16 PM INTERPRETATION LOCATION: [facility] CLINICAL INDICATION: 74 years old Male with COUGH  COMPARISON: CT chest dated 03/29/2020 TECHNIQUE: Portable Chest Radiograph. FINDINGS: Bilateral patchy airspace opacities more confluent in the right lung. No pleural effusion or pneumothorax Stable cardiomediastinal silhouette.     Bilateral patchy airspace opacities more confluent in the right lung, likely pneumonia. Recommend follow-up chest radiograph after treatment in 2-3 weeks to document resolution.    CT Chest Wo Contrast    Result Date: 03/29/2020  EXAM: CT CHEST WO CONTRAST DATE: 03/29/2020 2:24 AM ACCESSION: [id number] UN DICTATED: 03/29/2020 4:48 AM INTERPRETATION LOCATION: [facility] CLINICAL INDICATION: 74 years old Male with f/u ct on 6/28 without resoultion of symptoms  COMPARISON: There are no comparison studies available. TECHNIQUE: A helical CT scan was obtained without IV contrast from the thoracic inlet through the hemidiaphragms. Images were reconstructed in the axial plane.  Coronal and sagittal reformatted images of the chest were also provided for further evaluation of the lung parenchyma. FINDINGS: AIRWAYS, LUNGS, PLEURA: Clear central airways. Small bilateral pleural effusions. Multifocal right upper lobe ground-glass and consolidative opacities. MEDIASTINUM: Heart size qualitatively normal. Coronary atherosclerosis. No pericardial effusion. Normal caliber thoracic aorta.  No mediastinal lymphadenopathy. IMAGED ABDOMEN: Splenomegaly; partially imaged spleen measures 18 cm. Right hydronephrosis. SOFT TISSUES: Unremarkable. BONES: Unremarkable.     Multifocal right upper lobe airspace opacities; likely pneumonia. Small bilateral pleural effusions. Splenomegaly and right hydronephrosis; recommend dedicated CT abdomen and pelvis to further evaluate.    CT Body Comparison Of Outside Film    Result Date: 03/30/2020  These images were imported for comparison purposes only.  They will not be interpreted and no charges will apply. ______________________________________________________________________    Activity Instructions     Activity as tolerated            Diet Instructions     Discharge diet (specify)      Nutrition Therapy: General (Regular)    Discharge Nutrition Therapy: Regular          Other Instructions     Call MD for:      Temperature greater than 100.5 degrees         Temperature greater than 100.5 degrees    Call MD for:  difficulty breathing, headache or visual disturbances      Call MD for:  extreme fatigue      Call MD for:  hives      Call MD for:  persistent dizziness or light-headedness      Call MD for:  persistent nausea or vomiting      Call MD for:  redness, tenderness, or signs of infection (pain, swelling, redness, odor or green/yellow discharge around incision site)      Call MD for:  severe uncontrolled pain      Discharge instructions      New/Important Medications:  - Venetoclax - this is your chemo pill.  Take 400 mg (4 tabs) daily at the same time each day.  This dose will be lowered once your start the Posaconazole (likely next week)  - Posaconazole (Noxafil) - this medication will help prevent  or treat a fungal infection. Someone from the pharmacy may call you about this medication. DO NOT start taking this medication until you are directed to by a provider to do so in clinic next week (Psychologist, counselling or [name])  - Valacyclovir (Valtrex) - this medication will help prevent viral infections such as the shingles. Take it daily  - Tranexamic Acid (TXA) - this medication will help with your platelet count.  Take this 3x/daily for the next several days. Depending on your platelet count, this may need to be refilled next week.  - Allopurinol - this medication will help protect your kidneys.   - Sevelamer - this medication will help protect your kidneys. Take it 3x/daily with meals  - Continue your home dose of sodium bicarbonate tabs  - STOP taking Hydrea    Home Health Needs: None    Follow up Appointments:   - You are scheduled to have Azacitidine injection for the next 3 days, on 7/15, 7/16 and 7/17  - You are scheduled to have your labs checked and possible transfusions on Friday 7/16 and Monday 7/19  - You are scheduled to see [name] (pharmacist), and [name] (NP) in the cancer clinic on Wednesday 7/21.    Diagnosis:   - AML    Course complications:  -  None      When to Call Your [facility] Cancer Care Team:   Monday- Friday from 8:00 am - 5:00 pm   On Nights, Weekends and Holidays   Call [phone]     Call [phone]  Or Toll free [phone]    Ask the operator to page the   Ask to speak to the Triage Nurse  Oncology Fellow on Call     RED ZONE:  Take action now!  You need to be seen right away. Call 911 or go to your nearest hospital for help.   - Symptoms are at a severe level of discomfort    - Bleeding that will not stop  - Chest Pain    - Hard to breathe    - Fall or passing out    - New Seizure    - Thoughts of hurting yourself or others     YELLOW ZONE: Take action today  This is NOT an all-inclusive list. Pleae call with any new or worsening symptoms.   Call your doctor, nurse or other healthcare provider at [phone]  You can be seen by a provider the same day through our Same Day Acute Care for Patients with Cancer program.   - Symptoms are new or worsening; You are not within your goal range for:    - Pain         - Swelling (leg, arm, abdomen, face, neck)    - Shortness of breath       - Skin rash or skin changes    - Bleeding (nose, urine, stool, wound)   - Wound issues (redness, drainage, re-opened)    - Feeling sick to your stomach and throwing up   - Confusion    - Mouth sores/pain in your mouth or throat    - Vision changes   - Hard stool or very loose stools (increase in ostomy  - Fever >100.4 F, shaking chills   Output)       - Worsening cough with mucus that is green, yellow or bloody   - No urine for 12 hours     - Pain or burning when going  to the bathroom - Feeding tube or other catheter/tube issue    - Home infusion pump issue - call [phone]   - Redness or pain at previous IV or port/catheter site    - Depressed or anxiety     GREEN ZONE: You are in control   Your symptoms are under control. Continue to take your medicine as ordered. Keep all visits to the doctor.   - No increase or worsening symptoms   - Able to take your medicine   - Able to drink and eat     For your safety and best care, please DO NOT use MyChart messages to report red or yellow symptoms.   MyChart messages are only checked during weekday normal business hours and you should receive a response within 2 business days.   Please use MyChart only for the following:   - Non-urgent medication refills, scheduling requests or general questions.         Patient Education:     Your blood counts will continue to drop, so it is good to adhere to the following precautions:    - Wash your hands routinely with soap and water  - Take your temperature when you have chills or are not feeling well  - Use a soft toothbrush  - Avoid constipation or straining with bowel movements. This may mean you occasionally need to take over-the-counter stool softeners or laxatives.   - Avoid people who have colds or the flu, or are not feeling well.  - Wear a mask when visiting crowded places.  - Avoid anyone who has received a live vaccination (shot) within the last three weeks  - Maintain a well-balanced diet and eat healthy foods  - Speak with your doctor before having any dental work done  - Limit exposure to pet feces (e.g., litter box)  - Do only as much activity as you can tolerate    Other instructions:  - Don't use dental floss if your platelet count is below 50,000. Your doctor or nurse should tell you if this is the case.  - Use any mouthwashes given to you as directed.  - If you can't tolerate regular brushing, use an oral swab (bristle-less) toothbrush, or use salt and baking soda to clean your mouth. Mix 1 teaspoon of salt and 1 teaspoon of baking soda into an 8-ounce glass of warm water. Swish and spit.  - Watch your mouth and tongue for white patches. This is a sign of fungal infection, a common side effect of chemotherapy. Be sure to tell your doctor about these patches. Medications or mouthwashes can be prescribed to help you fight the fungal infection.      COVID-19 is a new challenge, but [facility] and the [facility] is dedicated to providing you and your loved ones with the best possible cancer care and support in the safest way possible during this time. We made two videos about the ways we are working to keep you safe, such as offering the option to visit your care team over the phone or through a video, as well as support services offered for our patients and their caregivers. If you have any questions about your cancer care, please call your care team.  ??  Video #1: Keeping [facility] Cancer Care patients safe during the COVID-19 crisis  http://go.eabjmlille.com  ??  Video #2: Support for cancer patients and their caregivers during the COVID-19 pandemic  http://go.SecureGap.uy  New/Important Medications:  - Venetoclax - this is your chemo pill.  Take 400 mg (4 tabs) daily at the same time each day.  This dose will be lowered once your start the Posaconazole (likely next week)  - Posaconazole (Noxafil) - this medication will help prevent or treat a fungal infection. Someone from the pharmacy may call you about this medication. DO NOT start taking this medication until you are directed to by a provider to do so in clinic next week (Psychologist, counselling or [name])  - Valacyclovir (Valtrex) - this medication will help prevent viral infections such as the shingles. Take it daily  - Tranexamic Acid (TXA) - this medication will help with your platelet count.  Take this 3x/daily for the next several days. Depending on your platelet count, this may need to be refilled next week.  - Allopurinol - this medication will help protect your kidneys.   - Sevelamer - this medication will help protect your kidneys. Take it 3x/daily with meals  - Continue your home dose of sodium bicarbonate tabs  - STOP taking Hydrea    Home Health Needs: None    Follow up Appointments:   - You are scheduled to have Azacitidine injection for the next 3 days, on 7/15, 7/16 and 7/17  - You are scheduled to have your labs checked and possible transfusions on Friday 7/16 and Monday 7/19  - You are scheduled to see [name] (pharmacist), and [name] (NP) in the cancer clinic on Wednesday 7/21.    Diagnosis:   - AML    Course complications:  -  None      When to Call Your [facility] Team:   Monday- Friday from 8:00 am - 5:00 pm   On Nights, Weekends and Holidays   Call [phone]     Call [phone]  Or Toll free [phone]    Ask the operator to page the   Ask to speak to the Triage Nurse  Oncology Fellow on Call     RED ZONE:  Take action now!  You need to be seen right away. Call 911 or go to your nearest hospital for help.   - Symptoms are at a severe level of discomfort    - Bleeding that will not stop  - Chest Pain    - Hard to breathe    - Fall or passing out    - New Seizure    - Thoughts of hurting yourself or others     YELLOW ZONE: Take action today  This is NOT an all-inclusive list. Pleae call with any new or worsening symptoms.   Call your doctor, nurse or other healthcare provider at [phone]  You can be seen by a provider the same day through our Same Day Acute Care for Patients with Cancer program.   - Symptoms are new or worsening; You are not within your goal range for:    - Pain         - Swelling (leg, arm, abdomen, face, neck)    - Shortness of breath       - Skin rash or skin changes    - Bleeding (nose, urine, stool, wound)   - Wound issues (redness, drainage, re-opened)    - Feeling sick to your stomach and throwing up   - Confusion    - Mouth sores/pain in your mouth or throat    - Vision changes   - Hard stool or very loose stools (  increase in ostomy  - Fever >100.4 F, shaking chills   Output)       - Worsening cough with mucus that is green, yellow or bloody   - No urine for 12 hours     - Pain or burning when going to the bathroom    - Feeding tube or other catheter/tube issue    - Home infusion pump issue - call [phone]   - Redness or pain at previous IV or port/catheter site    - Depressed or anxiety     GREEN ZONE: You are in control   Your symptoms are under control. Continue to take your medicine as ordered. Keep all visits to the doctor.   - No increase or worsening symptoms   - Able to take your medicine   - Able to drink and eat     For your safety and best care, please DO NOT use MyChart messages to report red or yellow symptoms.   MyChart messages are only checked during weekday normal business hours and you should receive a response within 2 business days.   Please use MyChart only for the following:   - Non-urgent medication refills, scheduling requests or general questions.         Patient Education:     Your blood counts will continue to drop, so it is good to adhere to the following precautions:    - Wash your hands routinely with soap and water  - Take your temperature when you have chills or are not feeling well  - Use a soft toothbrush  - Avoid constipation or straining with bowel movements. This may mean you occasionally need to take over-the-counter stool softeners or laxatives.   - Avoid people who have colds or the flu, or are not feeling well.  - Wear a mask when visiting crowded places.  - Avoid anyone who has received a live vaccination (shot) within the last three weeks  - Maintain a well-balanced diet and eat healthy foods  - Speak with your doctor before having any dental work done  - Limit exposure to pet feces (e.g., litter box)  - Do only as much activity as you can tolerate    Other instructions:  - Don't use dental floss if your platelet count is below 50,000. Your doctor or nurse should tell you if this is the case.  - Use any mouthwashes given to you as directed.  - If you can't tolerate regular brushing, use an oral swab (bristle-less) toothbrush, or use salt and baking soda to clean your mouth. Mix 1 teaspoon of salt and 1 teaspoon of baking soda into an 8-ounce glass of warm water. Swish and spit.  - Watch your mouth and tongue for white patches. This is a sign of fungal infection, a common side effect of chemotherapy. Be sure to tell your doctor about these patches. Medications or mouthwashes can be prescribed to help you fight the fungal infection.      COVID-19 is a new challenge, but [place] and the [facility] is dedicated to providing you and your loved ones with the best possible cancer care and support in the safest way possible during this time. We made two videos about the ways we are working to keep you safe, such as offering the option to visit your care team over the phone or through a video, as well as support services offered for our patients and their caregivers. If you have any questions about  your cancer care, please call your care team.  ??  Video #1: Keeping [facility] Cancer Care patients safe during the COVID-19 crisis  http://go.eabjmlille.com  ??  Video #2: Support for cancer patients and their caregivers during the COVID-19 pandemic  http://go.SecureGap.uy          Follow Up instructions and Outpatient Referrals     Call MD for:      Temperature greater than 100.5 degrees    Call MD for:  difficulty breathing, headache or visual disturbances      Call MD for:  extreme fatigue      Call MD for:  hives      Call MD for:  persistent dizziness or light-headedness      Call MD for:  persistent nausea or vomiting      Call MD for:  redness, tenderness, or signs of infection (pain, swelling, redness, odor or green/yellow discharge around incision site)      Call MD for:  severe uncontrolled pain      Discharge instructions      New/Important Medications:  - Venetoclax - this is your chemo pill.  Take 400 mg (4 tabs) daily at the same time each day.  This dose will be lowered once your start the Posaconazole (likely next week)  - Posaconazole (Noxafil) - this medication will help prevent or treat a fungal infection. Someone from the pharmacy may call you about this medication. DO NOT start taking this medication until you are directed to by a provider to do so in clinic next week (Psychologist, counselling or [name])  - Valacyclovir (Valtrex) - this medication will help prevent viral infections such as the shingles. Take it daily  - Tranexamic Acid (TXA) - this medication will help with your platelet count.  Take this 3x/daily for the next several days. Depending on your platelet count, this may need to be refilled next week.  - Allopurinol - this medication will help protect your kidneys.   - Sevelamer - this medication will help protect your kidneys. Take it 3x/daily with meals  - Continue your home dose of sodium bicarbonate tabs  - STOP taking Hydrea    Home Health Needs: None    Follow up Appointments:   - You are scheduled to have Azacitidine injection for the next 3 days, on 7/15, 7/16 and 7/17  - You are scheduled to have your labs checked and possible transfusions on Friday 7/16 and Monday 7/19  - You are scheduled to see [name] (pharmacist), and [name] (NP) in the cancer clinic on Wednesday 7/21.    Diagnosis:   - AML    Course complications:  -  None      When to Call Your [facility] Cancer Care Team:   Monday- Friday from 8:00 am - 5:00 pm   On Nights, Weekends and Holidays   Call [phone]     Call [phone]  Or Toll free [phone]    Ask the operator to page the   Ask to speak to the Triage Nurse  Oncology Fellow on Call     RED ZONE:  Take action now!  You need to be seen right away. Call 911 or go to your nearest hospital for help.   - Symptoms are at a severe level of discomfort    - Bleeding that will not stop  - Chest Pain    - Hard to breathe    - Fall or passing out    -  New Seizure    - Thoughts of hurting yourself or others     YELLOW ZONE: Take action today  This is NOT an all-inclusive list. Pleae call with any new or worsening symptoms.   Call your doctor, nurse or other healthcare provider at [phone]  You can be seen by a provider the same day through our Same Day Acute Care for Patients with Cancer program.   - Symptoms are new or worsening; You are not within your goal range for:    - Pain         - Swelling (leg, arm, abdomen, face, neck)    - Shortness of breath       - Skin rash or skin changes    - Bleeding (nose, urine, stool, wound)   - Wound issues (redness, drainage, re-opened)    - Feeling sick to your stomach and throwing up   - Confusion    - Mouth sores/pain in your mouth or throat    - Vision changes   - Hard stool or very loose stools (increase in ostomy  - Fever >100.4 F, shaking chills   Output)       - Worsening cough with mucus that is green, yellow or bloody   - No urine for 12 hours     - Pain or burning when going to the bathroom    - Feeding tube or other catheter/tube issue    - Home infusion pump issue - call [phone]   - Redness or pain at previous IV or port/catheter site    - Depressed or anxiety     GREEN ZONE: You are in control   Your symptoms are under control. Continue to take your medicine as ordered. Keep all visits to the doctor.   - No increase or worsening symptoms   - Able to take your medicine   - Able to drink and eat     For your safety and best care, please DO NOT use MyChart messages to report red or yellow symptoms.   MyChart messages are only checked during weekday normal business hours and you should receive a response within 2 business days.   Please use MyChart only for the following:   - Non-urgent medication refills, scheduling requests or general questions. Patient Education:     Your blood counts will continue to drop, so it is good to adhere to the following precautions:    - Wash your hands routinely with soap and water  - Take your temperature when you have chills or are not feeling well  - Use a soft toothbrush  - Avoid constipation or straining with bowel movements. This may mean you occasionally need to take over-the-counter stool softeners or laxatives.   - Avoid people who have colds or the flu, or are not feeling well.  - Wear a mask when visiting crowded places.  - Avoid anyone who has received a live vaccination (shot) within the last three weeks  - Maintain a well-balanced diet and eat healthy foods  - Speak with your doctor before having any dental work done  - Limit exposure to pet feces (e.g., litter box)  - Do only as much activity as you can tolerate    Other instructions:  - Don't use dental floss if your platelet count is below 50,000. Your doctor or nurse should tell you if this is the case.  - Use any mouthwashes given to you as directed.  - If you can't tolerate  regular brushing, use an oral swab (bristle-less) toothbrush, or use salt and baking soda to clean your mouth. Mix 1 teaspoon of salt and 1 teaspoon of baking soda into an 8-ounce glass of warm water. Swish and spit.  - Watch your mouth and tongue for white patches. This is a sign of fungal infection, a common side effect of chemotherapy. Be sure to tell your doctor about these patches. Medications or mouthwashes can be prescribed to help you fight the fungal infection.      COVID-19 is a new challenge, but [place] and the [facility] is dedicated to providing you and your loved ones with the best possible cancer care and support in the safest way possible during this time. We made two videos about the ways we are working to keep you safe, such as offering the option to visit your care team over the phone or through a video, as well as support services offered for our patients and their caregivers. If you have any questions about your cancer care, please call your care team.  ??  Video #1: Keeping [facility] Cancer Care patients safe during the COVID-19 crisis  http://go.eabjmlille.com  ??  Video #2: Support for cancer patients and their caregivers during the COVID-19 pandemic  http://go.SecureGap.uy    Magnesium Level            Appointments which have been scheduled for you    Apr 05, 2020 10:00 AM  (Arrive by 9:30 AM)  NURSE LAB DRAW with ADULT ONC LAB  [facility] ADULT ONCOLOGY LAB DRAW STATION [place] [facility] REGION) [address]  [place] [address]  [phone]      Apr 05, 2020 11:00 AM  (Arrive by 10:30 AM)  LEVEL 120 with [employer] CHAIR 24  [place] ONCOLOGY INFUSION [place] [facility] REGION) [address]  [place] [address]  [phone]      Apr 06, 2020 11:00 AM  (Arrive by 10:30 AM)  NURSE LAB DRAW with ADULT ONC LAB  [facility] ADULT ONCOLOGY LAB DRAW STATION [facility] REGION) [address]  [place] [address]  [phone]      Apr 06, 2020 12:00 PM  (Arrive by 11:30 AM)  LEVEL 240 with [employer] CHAIR 15  [place] ONCOLOGY INFUSION [place] [facility] REGION) [address]  [place] [address]  [phone]      Apr 07, 2020 11:00 AM  (Arrive by 10:30 AM)  LEVEL 120 with [employer] CHAIR 05  [place] ONCOLOGY INFUSION [place] [facility] REGION) [address]  [place] [address]  [phone]      Apr 09, 2020 12:45 PM  (Arrive by 12:15 PM)  NURSE LAB DRAW with ADULT ONC LAB  [facility] ADULT ONCOLOGY LAB DRAW STATION [place] [facility] REGION) [address]  [place] [address]  [phone]      Apr 09, 2020  1:30 PM  (Arrive by 1:00 PM)  BLOOD TRANSFUSION - 2 UNITS with [employer] CHAIR 05  [place] ONCOLOGY INFUSION [place] [facility] REGION) [address]  [place] [address]  [phone]      Apr 11, 2020 12:00 PM  (Arrive by 11:30 AM)  LAB ONLY [place] with ADULT ONC LAB  [facility] ADULT ONCOLOGY LAB DRAW STATION [place] [facility] REGION) [address]  [place] [address]  [phone]  Apr 11, 2020  1:00 PM  (Arrive by 12:30 PM)  RETURN ACTIVE [place] with Malva Cogan, CPP  [facility] HEMATOLOGY ONCOLOGY 2ND FLR CANCER HOSP [facility] REGION) [address]  [place] [address]  [phone]      Apr 11, 2020  2:00 PM  (Arrive by 1:30 PM)  RETURN ACTIVE [place] with Vernie Murders, [place]  [facility] HEMATOLOGY ONCOLOGY 2ND FLR CANCER HOSP [facility] REGION) [address]  [place] [address]  [phone]      Apr 13, 2020 11:15 AM  (Arrive by 10:45 AM)  NURSE LAB DRAW with ADULT ONC LAB  [facility] ADULT ONCOLOGY LAB DRAW STATION [place] [facility] REGION) [address]  [place] [address]  [phone]      Apr 13, 2020 12:00 PM  (Arrive by 11:30 AM)  BLOOD TRANSFUSION - 2 UNITS with [employer] CHAIR 23  [place] ONCOLOGY INFUSION [place] [facility] REGION) [address]  [place] [address]  [phone]      Apr 17, 2020 11:30 AM  (Arrive by 11:00 AM)  NURSE LAB DRAW with ADULT ONC LAB  [facility] ADULT ONCOLOGY LAB DRAW STATION [place] [facility] REGION) [address]  [place] [address]  [phone]      Apr 17, 2020 12:30 PM  (Arrive by 12:00 PM)  BLOOD TRANSFUSION - 2 UNITS with [employer] CHAIR 22  [place] ONCOLOGY INFUSION [place] [facility] REGION) [address]  [place] [address]  [phone]      Apr 18, 2020  9:30 AM  (Arrive by 9:00 AM)  LAB ONLY [place] with ADULT ONC LAB  [facility] ADULT ONCOLOGY LAB DRAW STATION [place] [facility] REGION) [address]  [place] [address]  [phone]      Apr 18, 2020 10:30 AM  (Arrive by 10:00 AM)  RETURN ACTIVE [place] with Vernie Murders, [place]  [place] HEMATOLOGY ONCOLOGY 2ND FLR CANCER HOSP [facility] REGION) [address]  [place] [address]  [phone]      Apr 18, 2020 11:00 AM  (Arrive by 10:30 AM)  RETURN ACTIVE [place] with Malva Cogan, CPP  [place] HEMATOLOGY ONCOLOGY 2ND FLR CANCER HOSP [facility] REGION) [address]  [place] [address]  [phone]      Apr 20, 2020 11:30 AM  (Arrive by 11:00 AM)  NURSE LAB DRAW with ADULT ONC LAB  [facility] ADULT ONCOLOGY LAB DRAW STATION [facility] REGION) [address]  [place] [address]  [phone]      Apr 20, 2020 12:30 PM  (Arrive by 12:00 PM)  BLOOD TRANSFUSION - 2 UNITS with [employer] CHAIR 15  [place] ONCOLOGY INFUSION [facility] REGION) [address]  [place] [address]  [phone]      Apr 23, 2020 11:15 AM  (Arrive by 10:45 AM)  LAB ONLY [place] with ADULT ONC LAB  [facility] ADULT ONCOLOGY LAB DRAW STATION [place] [facility] REGION) [address]  [place] [address]  [phone]      Apr 23, 2020 12:00 PM  (Arrive by 11:30 AM)  RETURN ACTIVE [place] with Malva Cogan, CPP  [place] HEMATOLOGY ONCOLOGY 2ND FLR CANCER HOSP [facility] REGION) [address]  [place] [address]  [phone]      Apr 23, 2020  1:00 PM  (Arrive by 12:30 PM)  RETURN ACTIVE [place] with Vernie Murders, AGNP  [place] HEMATOLOGY ONCOLOGY 2ND FLR CANCER HOSP [facility] REGION) [address]  [place] [address]  [phone]      Apr 24, 2020 11:30 AM  (Arrive by 11:00 AM)  NURSE LAB DRAW with ADULT ONC LAB  [facility] ADULT ONCOLOGY LAB DRAW STATION [place] [facility] REGION) [address]  [place] [address]  [phone]      Apr 24, 2020 12:30 PM  (Arrive by 12:00 PM)  BLOOD TRANSFUSION - 2 UNITS with [employer] CHAIR 20  [place] ONCOLOGY INFUSION [place] [facility] REGION) [address]  [place] [address]  [phone]      Apr 27, 2020  1:00 PM  (Arrive by 12:30 PM)  NURSE LAB DRAW with ADULT ONC LAB  [facility] ADULT ONCOLOGY LAB DRAW STATION [place] [facility] REGION) [address]  [place] [address]  [phone]      Apr 27, 2020  2:00 PM  (Arrive by 1:30 PM)  BONE MARROW BIOPSY with Bernerd Limbo, FNP  [facility] ONCOLOGY INFUSION [place] [facility] REGION) [address]  [place] [address]  [phone]      Apr 27, 2020  3:00 PM  (Arrive by 2:30 PM)  BLOOD TRANSFUSION - 2 UNITS with ONCINF CHAIR 28  [place] ONCOLOGY INFUSION [place] [facility] REGION) [address]  [place] [address]  [phone]           ______________________________________________________________________  Discharge Day Services:  BP 159/83  - Pulse 74  - Temp 36.5 ??C (97.7 ??F) (Oral)  - Resp 18  - Ht 182.9 cm (6')  - Wt 73.1 kg (161 lb 1.6 oz)  - SpO2 100%  - BMI 21.85 kg/m??   Pt seen on the day of discharge and determined appropriate for discharge.    Condition at Discharge: good    Length of Discharge: I spent greater than 30 mins in the discharge of this patient.

## 2020-04-05 NOTE — ED Notes (Signed)
Pt wife to hallway asking for more tissues for pt. This RN provided pt with new box of tissues, wet and dry washcloth to clean hands, water to rinse and spit drainage from throat, and new emesis bag. Pt responding to this RN in full sentences at this time, NAD, RR even and unlabored.

## 2020-04-05 NOTE — ED Triage Notes (Signed)
Pt to triage via w/c accomp by wife; pt d/c from [facility] today, hx AML and having epistaxis; wife reports large clots removed PTA and has hx of same

## 2020-04-05 NOTE — ED Provider Notes (Signed)
[facility] Emergency Department Provider Note  ____________________________________________   First MD Initiated Contact with Patient 04/05/20 [phone]     (approximate)  I have reviewed the triage vital signs and the nursing notes.   HISTORY  Chief Complaint Epistaxis    HPI Ruben [name] is a 74 y.o. 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  He has had issues with nosebleeds in the past and has required cauterization previously including by Dr. Tami Ribas with ENT.  He had a little bit of oozing from his left naris while he was at the oncologist earlier in the day yesterday.  They began treatment with oral TXA but he has been a lot less than 24 hours.  Tonight he did not sustain any trauma but he started bleeding from the left naris again and it gradually increased in severity to become at least moderate by the time he came to the emergency department.   He has had some blood in the back of his throat and he has been spitting and coughing some of it up but he is not having any difficulty breathing.  Nothing in particular makes the symptoms better or worse including applying direct pressure.  His family is very concerned because of his oncological issues and low platelet count.        Past Medical History:  Diagnosis Date  . Blood dyscrasia   . BPH (benign prostatic hyperplasia)   . Cancer ([place])    SKIN/ POLYCYTHEMIA VERA  . Chronic kidney disease    RENAL INSUFF (40%)  . Crohn disease ([place])   . Crohn's disease ([place])   . Glaucoma   . Glaucoma   . Gout   . History of chicken pox   . Myelofibrosis ([place])   . Nosebleed   . RBBB   . Right bundle branch block     Patient Active Problem List   Diagnosis Date Noted  . Epistaxis, recurrent 04/01/2020  . 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 Normocytic anemia 03/02/2020  . Weight loss 03/02/2020  . Pneumonia of both lungs due to infectious organism 03/01/2020  . Elevated total protein 12/04/2019  . Secondary hyperparathyroidism of renal origin ([place]) 09/20/2019  . Chronic kidney disease, stage 3a 09/20/2019  . Foot drop, right 06/09/2019  . Crohn's disease of small intestine without complication ([place]) 33/29/5188  . Right-sided thoracic back pain 04/06/2019  . Hyponatremia 04/06/2019  . Iron deficiency anemia 11/26/2018  . Goals of care, counseling/discussion 01/06/2018  . Myeloproliferative neoplasm ([place]) 01/04/2018  . High triglycerides 10/14/2017  . 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 Renal insufficiency 07/23/2016  . 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 Right ankle pain 10/31/2015  . Hyperuricemia 09/05/2015  . BPH (benign prostatic hyperplasia) 08/23/2015  . History of tobacco use 08/23/2015  . Glaucoma 08/23/2015  . Enlarged prostate without lower urinary tract symptoms (luts) 08/23/2015  . Elevated prostate specific antigen (PSA) 08/23/2015  . Dermatitis 08/23/2015  . Crohn disease ([place]) 08/23/2015  . Other prurigo 08/23/2015  . Dorsalgia 08/23/2015  . Personal history of nicotine dependence 08/23/2015  . 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 Secondary polycythemia 03/28/2015  . 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 Right bundle-branch block 05/03/2012    Past Surgical History:  Procedure Laterality Date  . CATARACT EXTRACTION  W/PHACO Left 07/13/2018   Procedure: CATARACT EXTRACTION PHACO AND INTRAOCULAR LENS PLACEMENT (IOC);  Surgeon: [name], MD;  Location: [facility] ORS;  Service: Ophthalmology;  Laterality: Left;  [place] 00:39  CDE 5.14 Fluid pack lot # [id number]  . CATARACT EXTRACTION W/PHACO Right 08/03/2018   Procedure: CATARACT EXTRACTION PHACO AND INTRAOCULAR LENS PLACEMENT (IOC);  Surgeon: [name], MD;  Location: [facility] ORS;  Service: Ophthalmology;   Laterality: Right;  [place] 00:43.4 CDE 5.23 Fluid pack Lot # [id number]  . COLONOSCOPY WITH PROPOFOL N/A 03/27/2017   Procedure: COLONOSCOPY WITH PROPOFOL;  Surgeon: [name], MD;  Location: [facility] ENDOSCOPY;  Service: Endoscopy;  Laterality: N/A;  . MOHS SURGERY     EAR  . PROSTATE SURGERY  2008   Prostate Biopsy in [place] to elevated PSA.  per patient normal  . Skin Lesion Basal cell removed    . wart removal     from eyelid    Prior to Admission medications   Medication Sig Start Date End Date Taking? Authorizing Provider  acetaminophen (TYLENOL) 500 MG tablet Take 500 mg by mouth daily as needed for moderate pain.    [provider]  albuterol (VENTOLIN HFA) 108 (90 Base) MCG/ACT inhaler Inhale 2 puffs into the lungs every 6 (six) hours as needed for wheezing or shortness of breath. 02/29/20   Jacquelin Hawking, NP  allopurinol (ZYLOPRIM) 300 MG tablet TAKE 1/2 TABLET (150 MG TOTAL) BY MOUTH DAILY. 12/05/19   Lequita Asal, MD  Cholecalciferol (VITAMIN D3) 1000 units CAPS Take 1,000 Units by mouth daily at 12 noon.     [provider]  clobetasol ointment (TEMOVATE) 0.05 %  01/09/20   [provider]  Cyanocobalamin (RA VITAMIN B-12 TR) 1000 MCG TBCR Take 1,000 mcg by mouth daily at 12 noon.     [provider]  Fedratinib HCl (INREBIC) 100 MG CAPS Take 200 mg by mouth daily.  01/02/20   [provider]  finasteride (PROSCAR) 5 MG tablet Take 1 tablet (5 mg total) by mouth every evening. 11/08/19   Hollice Espy, MD  folic acid (FOLVITE) 1 MG tablet Take 1 mg by mouth daily at 12 noon.  03/20/15   [provider]  gentamicin ointment (GARAMYCIN) 0.1 % SMARTSIG:1 Topical Every Night 03/20/20   [provider]  hydroxyurea (HYDREA) 500 MG capsule Take 1,000 mg by mouth 2 (two) times daily.  03/06/20   [provider]  latanoprost (XALATAN) 0.005 % ophthalmic solution Place 1 drop into both eyes at bedtime.      [provider]  Multiple Vitamins-Minerals (CENTRUM SILVER ULTRA MENS) TABS Take 1 tablet by mouth daily at 12 noon.     [provider]  Omega-3 Fatty Acids (KP FISH OIL) 1200 MG CAPS Take 1,200 mg by mouth daily.     [provider]  sodium bicarbonate 650 MG tablet Take 650 mg by mouth 2 (two) times daily. 06/06/17   [provider]  timolol (TIMOPTIC) 0.5 % ophthalmic solution Place 1 drop into both eyes daily. In AM 05/27/18   [provider]  triamcinolone cream (KENALOG) 0.1 % Apply topically 2 (two) times daily as needed. Patient not taking: Reported on 03/22/2020 11/03/19   [provider]  venetoclax 100 MG TABS Take by mouth.  03/06/20   [provider]    Allergies Patient has no known allergies.  Family History  Problem Relation Age of Onset  . Cancer Mother  Liver  . Cancer Father        Prostate  . Cancer Sister        Hodgkins lymphoma  . Cancer Paternal Aunt        Breast    Social History Social History   Tobacco Use  . Smoking status: Former Smoker    Packs/day: 0.25    Years: 20.00    Pack years: 5.00    Types: Cigarettes    Quit date: 09/28/2003    Years since quitting: 16.5  . Smokeless tobacco: Never Used  Vaping Use  . Vaping Use: Never used  Substance Use Topics  . Alcohol use: Yes    Alcohol/week: 4.0 - 5.0 standard drinks    Types: 4 - 5 Glasses of wine per week  . Drug use: No    Review of Systems Constitutional: No fever/chills Eyes: No visual changes. ENT: Left-sided epistaxis Cardiovascular: Denies chest pain. Respiratory: Denies shortness of breath. Gastrointestinal: No abdominal pain.  No nausea, no vomiting.  No diarrhea.  No constipation. Genitourinary: Negative for dysuria. Musculoskeletal: Negative for neck pain.  Negative for back pain. Integumentary: Negative for rash. Neurological: Negative for headaches, focal weakness or  numbness.   ____________________________________________   PHYSICAL EXAM:  VITAL SIGNS: ED Triage Vitals [04/05/20 0127]  Enc Vitals Group     BP (!) 157/91     Pulse Rate 84     Resp 14     Temp 97.9 F (36.6 C)     Temp Source Oral     SpO2 99 %     Weight 73.8 kg (162 lb 11.2 oz)     Height 1.829 m (6')     Head Circumference      Peak Flow      Pain Score 0     Pain Loc      Pain Edu?      Excl. in [place]?     Constitutional: Alert and oriented.  No acute distress. Eyes: Conjunctivae are normal.  Head: Atraumatic. Nose: Minimal epistaxis from left naris.  No evidence of acute bleeding from right naris. Mouth/Throat: Minimal blood in the back of the throat. Neck: No stridor.  No meningeal signs.   Cardiovascular: Normal rate, regular rhythm. Good peripheral circulation. Grossly normal heart sounds. Respiratory: Normal respiratory effort.  No retractions. Gastrointestinal: Soft and nontender. No distention.  Musculoskeletal: No lower extremity tenderness nor edema. No gross deformities of extremities. Neurologic:  Normal speech and language. No gross focal neurologic deficits are appreciated.  Skin:  Skin is warm, dry and intact. Psychiatric: Mood and affect are normal. Speech and behavior are normal.  ____________________________________________   LABS (all labs ordered are listed, but only abnormal results are displayed)  Labs Reviewed - No data to display ____________________________________________  EKG  No indication for emergent EKG ____________________________________________  RADIOLOGY I, Hinda Kehr, personally viewed and evaluated these images (plain radiographs) as part of my medical decision making, as well as reviewing the written report by the radiologist.  ED MD interpretation: No indication for emergent imaging  Official radiology report(s): No results found.  ____________________________________________   PROCEDURES   Procedure(s)  performed (including Critical Care):  .Epistaxis Management  Date/Time: 04/05/2020 2:44 AM Performed by: Hinda Kehr, MD Authorized by: Hinda Kehr, MD   Consent:    Consent obtained:  Verbal   Consent given by:  Patient and spouse   Risks discussed:  Bleeding, infection, nasal injury and pain   Alternatives discussed:  No  treatment Anesthesia (see MAR for exact dosages):    Anesthesia method:  None Procedure details:    Treatment site:  L anterior   Treatment method:  Anterior pack (2x2 gauze soaked with TXA)   Treatment complexity:  Limited   Treatment episode: initial   Post-procedure details:    Assessment:  Bleeding stopped   Patient tolerance of procedure:  Tolerated well, no immediate complications     ____________________________________________   INITIAL IMPRESSION / MDM / ASSESSMENT AND PLAN / ED COURSE  As part of my medical decision making, I reviewed the following data within the [place] History obtained from family, Nursing notes reviewed and incorporated, Old chart reviewed and Notes from prior ED visits   Differential diagnosis includes, but is not limited to, bleeding due to thrombocytopenia, infection, mild trauma.  The patient has relatively minimal bleeding but the family is understandably concerned given his chronic issues.  His airway is not in jeopardy and he is otherwise asymptomatic.  We discussed various treatment options including balloon or Merocel sponge placement but agreed first that we would try TXA soaked gauze.  He tolerated the placement of the gauze well and has had not having any bleeding currently.  I will reassess after 20 to 30 minutes.  I reviewed the medical record and saw that he had an appointment with his oncologist within about the last 12 hours (earlier in the morning) and that he has another appointment and only a few hours first thing in the morning, so there is no indication for repeat lab work  tonight.       Clinical Course as of Apr 05 545  Thu Apr 05, 2020  4562 After approximately 30 minutes with the TXA soaked gauze in place, I removed it and observe the patient for another few minutes in person.  He has no additional epistaxis and no dripping of blood down the back of his throat.  He and his wife are comfortable with the plan for discharge.  I had a half of the bottle of TXA left, and they have an appointment with his oncologist in less than 5 hours at [facility].  I provided some 2 x 2 gauze soaked in the remaining TXA and recommended that they pack his nose anteriorly with the TXA soaked gauze if he has any additional bleeding between now and his follow-up appointment and they were very happy and comfortable with this.  Given his close follow-up and their reliability, I believe this is good for the patient and not dangerous or that it will in any way to impact him negatively.I gave my usual and customary epistaxis management recommendations and return precautions and they will follow up in a few hours with his oncologist at [facility].   [CF]    Clinical Course User Index [CF] Hinda Kehr, MD     ____________________________________________  FINAL CLINICAL IMPRESSION(S) / ED DIAGNOSES  Final diagnoses:  Left-sided epistaxis  Thrombocytopenia ([place])     MEDICATIONS GIVEN DURING THIS VISIT:  Medications  tranexamic acid (CYKLOKAPRON) 1000 MG/10ML topical solution 500 mg (500 mg Topical Given by Other 04/05/20 0314)     ED Discharge Orders    None      *Please note:  IMAAD REUSS was evaluated in Emergency Department on 04/05/2020 for the symptoms described in the history of present illness. He was evaluated in the context of the global COVID-19 pandemic, which necessitated consideration that the patient might be at risk for infection with  the SARS-CoV-2 virus that causes COVID-19. Institutional protocols and algorithms that pertain to the evaluation of patients at risk for  COVID-19 are in a state of rapid change based on information released by regulatory bodies including the CDC and federal and state organizations. These policies and algorithms were followed during the patient's care in the ED.  Some ED evaluations and interventions may be delayed as a result of limited staffing during and after the pandemic.*  Note:  This document was prepared using Dragon voice recognition software and may include unintentional dictation errors.   Hinda Kehr, MD 04/05/20 [phone]

## 2020-04-05 NOTE — ED Notes (Signed)
Pts wife out in hallway upset that no one has been in room. This RN into room with wife and pt to explain delay in care. Pts wife tearful, on phone and sts, "I need to just call 911 and have him sent to [facility]." This RN explained to wife and pt that the clamp on pts nose is necessary to stop the nose from bleeding and that the MD would be in to complete other interventions as soon as possible. Pts wife crying and still on phone. Pts wife sts, "This is a symptom of his cancer and he has this periodically. He is very sick and we just left the cancer hospital yesterday and I have been up for 24 hours." This RN stated, "I understand your frustration and concern. I am sorry that you both are going through so much. I will speak with the MD on the concern and if there is anything I can help with, I will." Daughter out front requesting to speak with this RN. This RN and pts spouse to [place] to speak with daughter. Daughters sts, "I understand this is not your fault and that you are doing what you can but this man is very sick. Can you skip the ED doctor seeing the pt and go straight to the ENT?" This RN educates both daughter and wife on the process of how the emergency room doctor evaluates the pt and then plans care accordingly. Daughter requesting to take pt to [facility]. This RN explains that pt is not held against will and can choose to leave by signing an [place]. Daughter sts, "Oh so he will still be charged with the medical fees even if no one seen him or done anything for him. This is F'ed up." Wife sts, "Its okay cause if something happens to him, I have a lawyer." This RN calmly explains that she is going back to speak with the MD and informs both wife and daughter to let know if they decide to [facility].

## 2020-04-05 NOTE — Discharge Instructions (Signed)
As we discussed, try to avoid touching or picking at your nose in any way.  Apply pressure using the nasal clip we provided if needed.  As a last resort, if you start having some additional bleeding, try packing your left nostril again with the TXA-soaked gauze I provided.  It is very important you follow-up with your oncologist later this morning as scheduled.    Return to the emergency department if you develop new or worsening symptoms that concern you.

## 2020-04-06 ENCOUNTER — Other Ambulatory Visit: Admit: 2020-04-06 | Discharge: 2020-04-07 | Payer: MEDICARE

## 2020-04-06 ENCOUNTER — Ambulatory Visit: Admit: 2020-04-06 | Discharge: 2020-04-07 | Payer: MEDICARE

## 2020-04-06 DIAGNOSIS — Z5111 Encounter for antineoplastic chemotherapy: Secondary | ICD-10-CM | POA: Diagnosis not present

## 2020-04-06 DIAGNOSIS — C9200 Acute myeloblastic leukemia, not having achieved remission: Secondary | ICD-10-CM | POA: Diagnosis not present

## 2020-04-06 LAB — CBC W/ AUTO DIFF
BASOPHILS ABSOLUTE COUNT: 0 10*9/L (ref 0.0–0.1)
BASOPHILS RELATIVE PERCENT: 0.4 %
EOSINOPHILS ABSOLUTE COUNT: 0 10*9/L (ref 0.0–0.4)
EOSINOPHILS RELATIVE PERCENT: 0.1 %
HEMATOCRIT: 33.8 % — ABNORMAL LOW (ref 41.0–53.0)
HEMOGLOBIN: 10.4 g/dL — ABNORMAL LOW (ref 13.5–17.5)
LARGE UNSTAINED CELLS: 3 % (ref 0–4)
LYMPHOCYTES ABSOLUTE COUNT: 1 10*9/L — ABNORMAL LOW (ref 1.5–5.0)
LYMPHOCYTES RELATIVE PERCENT: 14.3 %
MEAN CORPUSCULAR HEMOGLOBIN: 27.8 pg (ref 26.0–34.0)
MEAN CORPUSCULAR VOLUME: 90.1 fL (ref 80.0–100.0)
MEAN PLATELET VOLUME: 7.4 fL (ref 7.0–10.0)
MONOCYTES ABSOLUTE COUNT: 0.2 10*9/L (ref 0.2–0.8)
MONOCYTES RELATIVE PERCENT: 2.1 %
NEUTROPHILS ABSOLUTE COUNT: 5.4 10*9/L (ref 2.0–7.5)
NEUTROPHILS RELATIVE PERCENT: 80.1 %
PLATELET COUNT: 28 10*9/L — ABNORMAL LOW (ref 150–440)
RED BLOOD CELL COUNT: 3.76 10*12/L — ABNORMAL LOW (ref 4.50–5.90)
RED CELL DISTRIBUTION WIDTH: 23.4 % — ABNORMAL HIGH (ref 12.0–15.0)
WBC ADJUSTED: 6.8 10*9/L (ref 4.5–11.0)

## 2020-04-06 LAB — COMPREHENSIVE METABOLIC PANEL
ALBUMIN: 2.9 g/dL — ABNORMAL LOW (ref 3.4–5.0)
ALKALINE PHOSPHATASE: 78 U/L (ref 46–116)
ALT (SGPT): 11 U/L (ref 10–49)
ANION GAP: 7 mmol/L (ref 5–14)
AST (SGOT): 31 U/L (ref <=34)
BILIRUBIN TOTAL: 0.8 mg/dL (ref 0.3–1.2)
BUN / CREAT RATIO: 32
CALCIUM: 8.9 mg/dL (ref 8.7–10.4)
CHLORIDE: 104 mmol/L (ref 98–107)
CO2: 20 mmol/L (ref 20.0–31.0)
EGFR CKD-EPI AA MALE: 54 mL/min/{1.73_m2} — ABNORMAL LOW (ref >=60)
EGFR CKD-EPI NON-AA MALE: 47 mL/min/{1.73_m2} — ABNORMAL LOW (ref >=60)
GLUCOSE RANDOM: 95 mg/dL (ref 70–179)
POTASSIUM: 4.8 mmol/L — ABNORMAL HIGH (ref 3.4–4.5)
PROTEIN TOTAL: 8.1 g/dL (ref 5.7–8.2)
SODIUM: 131 mmol/L — ABNORMAL LOW (ref 135–145)

## 2020-04-06 LAB — MEAN CORPUSCULAR VOLUME: Erythrocyte mean corpuscular volume:EntVol:Pt:RBC:Qn:Automated count: 90.1

## 2020-04-06 LAB — SLIDE REVIEW

## 2020-04-06 LAB — SMEAR REVIEW

## 2020-04-06 LAB — CO2: Carbon dioxide:SCnc:Pt:Ser/Plas:Qn:: 20

## 2020-04-06 MED ADMIN — azaCITIDine (VIDAZA) syringe: 75 mg/m2 | SUBCUTANEOUS | @ 20:00:00 | Stop: 2020-04-06

## 2020-04-06 MED ADMIN — ondansetron (ZOFRAN) tablet 8 mg: 8 mg | ORAL | @ 19:00:00 | Stop: 2020-04-06

## 2020-04-06 NOTE — Unmapped (Unsigned)
1441; Pt here for scheduled treatment.     1800: Pt tolerated treatment/injection/transfusion W/O difficulty.PIV saline locked per protocol for treatment tomorrow.  Pt left infusion center ambulatory. NAD, no questions nor complaints voiced at D/C. Pt aware of follow up.

## 2020-04-07 ENCOUNTER — Ambulatory Visit: Admit: 2020-04-07 | Discharge: 2020-04-08 | Payer: MEDICARE

## 2020-04-07 DIAGNOSIS — Z5111 Encounter for antineoplastic chemotherapy: Secondary | ICD-10-CM | POA: Diagnosis not present

## 2020-04-07 DIAGNOSIS — C9200 Acute myeloblastic leukemia, not having achieved remission: Secondary | ICD-10-CM | POA: Diagnosis not present

## 2020-04-07 DIAGNOSIS — D471 Chronic myeloproliferative disease: Secondary | ICD-10-CM | POA: Diagnosis not present

## 2020-04-07 LAB — COMPREHENSIVE METABOLIC PANEL
ALKALINE PHOSPHATASE: 82 U/L (ref 46–116)
ALT (SGPT): 12 U/L (ref 10–49)
ANION GAP: 7 mmol/L (ref 5–14)
AST (SGOT): 34 U/L (ref <=34)
BILIRUBIN TOTAL: 0.8 mg/dL (ref 0.3–1.2)
BLOOD UREA NITROGEN: 53 mg/dL — ABNORMAL HIGH (ref 9–23)
BUN / CREAT RATIO: 37
CALCIUM: 8.9 mg/dL (ref 8.7–10.4)
CHLORIDE: 103 mmol/L (ref 98–107)
CO2: 21 mmol/L (ref 20.0–31.0)
CREATININE: 1.45 mg/dL — ABNORMAL HIGH
EGFR CKD-EPI AA MALE: 55 mL/min/{1.73_m2} — ABNORMAL LOW (ref >=60)
EGFR CKD-EPI NON-AA MALE: 47 mL/min/{1.73_m2} — ABNORMAL LOW (ref >=60)
GLUCOSE RANDOM: 92 mg/dL (ref 70–179)
POTASSIUM: 4.3 mmol/L (ref 3.5–5.1)
PROTEIN TOTAL: 8 g/dL (ref 5.7–8.2)
SODIUM: 131 mmol/L — ABNORMAL LOW (ref 135–145)

## 2020-04-07 LAB — CBC W/ AUTO DIFF
BASOPHILS ABSOLUTE COUNT: 0 10*9/L (ref 0.0–0.1)
BASOPHILS RELATIVE PERCENT: 0.5 %
EOSINOPHILS ABSOLUTE COUNT: 0 10*9/L (ref 0.0–0.4)
EOSINOPHILS RELATIVE PERCENT: 0.3 %
HEMATOCRIT: 33 % — ABNORMAL LOW (ref 41.0–53.0)
HEMOGLOBIN: 10.3 g/dL — ABNORMAL LOW (ref 13.5–17.5)
LARGE UNSTAINED CELLS: 3 % (ref 0–4)
LYMPHOCYTES ABSOLUTE COUNT: 0.9 10*9/L — ABNORMAL LOW (ref 1.5–5.0)
LYMPHOCYTES RELATIVE PERCENT: 15.5 %
MEAN CORPUSCULAR HEMOGLOBIN CONC: 31.2 g/dL (ref 31.0–37.0)
MEAN CORPUSCULAR HEMOGLOBIN: 28 pg (ref 26.0–34.0)
MEAN CORPUSCULAR VOLUME: 89.6 fL (ref 80.0–100.0)
MEAN PLATELET VOLUME: 8.4 fL (ref 7.0–10.0)
MONOCYTES RELATIVE PERCENT: 1.7 %
NEUTROPHILS ABSOLUTE COUNT: 4.8 10*9/L (ref 2.0–7.5)
NEUTROPHILS RELATIVE PERCENT: 78.8 %
PLATELET COUNT: 30 10*9/L — ABNORMAL LOW (ref 150–440)
RED BLOOD CELL COUNT: 3.68 10*12/L — ABNORMAL LOW (ref 4.50–5.90)
RED CELL DISTRIBUTION WIDTH: 23.4 % — ABNORMAL HIGH (ref 12.0–15.0)

## 2020-04-07 LAB — ALBUMIN: Albumin:MCnc:Pt:Ser/Plas:Qn:: 2.8 — ABNORMAL LOW

## 2020-04-07 LAB — HYPERSEGMENTED NEUTROPHILS

## 2020-04-07 LAB — PLATELET COUNT: Platelets:NCnc:Pt:Bld:Qn:Automated count: 44 — ABNORMAL LOW

## 2020-04-07 LAB — SLIDE REVIEW

## 2020-04-07 LAB — BASOPHILS ABSOLUTE COUNT: Basophils:NCnc:Pt:Bld:Qn:Automated count: 0

## 2020-04-07 MED ADMIN — ondansetron (ZOFRAN) tablet 8 mg: 8 mg | ORAL | @ 15:00:00 | Stop: 2020-04-07

## 2020-04-07 MED ADMIN — azaCITIDine (VIDAZA) syringe: 75 mg/m2 | SUBCUTANEOUS | @ 15:00:00 | Stop: 2020-04-07

## 2020-04-07 NOTE — Unmapped (Signed)
Report received from [place], [place].  Post plt count 44.  Meet parameters for 2nd unit of Plts.      Patient tolerated 2nd unit of plts and discharged to home.

## 2020-04-07 NOTE — Unmapped (Signed)
Hospital Outpatient Visit on 04/07/2020   Component Date Value Ref Range Status   ??? Sodium 04/07/2020 131* 135 - 145 mmol/L Final   ??? Potassium 04/07/2020 4.3  3.5 - 5.1 mmol/L Final   ??? Chloride 04/07/2020 103  98 - 107 mmol/L Final   ??? Anion Gap 04/07/2020 7  5 - 14 mmol/L Final   ??? CO2 04/07/2020 21.0  20.0 - 31.0 mmol/L Final   ??? BUN 04/07/2020 53* 9 - 23 mg/dL Final   ??? Creatinine 04/07/2020 1.45* 0.60 - 1.10 mg/dL Final   ??? BUN/Creatinine Ratio 04/07/2020 37   Final   ??? EGFR CKD-EPI Non-African American,* 04/07/2020 47* >=60 mL/min/1.81m2 Final   ??? EGFR CKD-EPI African American, Male 04/07/2020 55* >=60 mL/min/1.52m2 Final   ??? Glucose 04/07/2020 92  70 - 179 mg/dL Final   ??? Calcium 16/06/9603 8.9  8.7 - 10.4 mg/dL Final   ??? Albumin 54/05/8118 2.8* 3.4 - 5.0 g/dL Final   ??? Total Protein 04/07/2020 8.0  5.7 - 8.2 g/dL Final   ??? Total Bilirubin 04/07/2020 0.8  0.3 - 1.2 mg/dL Final   ??? AST 14/78/2956 34  <=34 U/L Final   ??? ALT 04/07/2020 12  10 - 49 U/L Final   ??? Alkaline Phosphatase 04/07/2020 82  46 - 116 U/L Final   ??? WBC 04/07/2020 6.1  4.5 - 11.0 10*9/L Final   ??? RBC 04/07/2020 3.68* 4.50 - 5.90 10*12/L Final   ??? HGB 04/07/2020 10.3* 13.5 - 17.5 g/dL Final   ??? HCT 21/30/8657 33.0* 41.0 - 53.0 % Final   ??? MCV 04/07/2020 89.6  80.0 - 100.0 fL Final   ??? MCH 04/07/2020 28.0  26.0 - 34.0 pg Final   ??? MCHC 04/07/2020 31.2  31.0 - 37.0 g/dL Final   ??? RDW 84/69/6295 23.4* 12.0 - 15.0 % Final   ??? MPV 04/07/2020 8.4  7.0 - 10.0 fL Final   ??? Platelet 04/07/2020 30* 150 - 440 10*9/L Final   ??? Variable HGB Concentration 04/07/2020 Moderate* Not Present Final   ??? Neutrophils % 04/07/2020 78.8  % Final   ??? Lymphocytes % 04/07/2020 15.5  % Final   ??? Monocytes % 04/07/2020 1.7  % Final   ??? Eosinophils % 04/07/2020 0.3  % Final   ??? Basophils % 04/07/2020 0.5  % Final   ??? Neutrophil Left Shift 04/07/2020 2+* Not Present Final   ??? Absolute Neutrophils 04/07/2020 4.8  2.0 - 7.5 10*9/L Final   ??? Absolute Lymphocytes 04/07/2020 0.9* 1.5 - 5.0 10*9/L Final   ??? Absolute Monocytes 04/07/2020 0.1* 0.2 - 0.8 10*9/L Final   ??? Absolute Eosinophils 04/07/2020 0.0  0.0 - 0.4 10*9/L Final   ??? Absolute Basophils 04/07/2020 0.0  0.0 - 0.1 10*9/L Final   ??? Large Unstained Cells 04/07/2020 3  0 - 4 % Final   ??? Microcytosis 04/07/2020 Moderate* Not Present Final   ??? Macrocytosis 04/07/2020 Moderate* Not Present Final   ??? Anisocytosis 04/07/2020 Marked* Not Present Final   ??? Hypochromasia 04/07/2020 Marked* Not Present Final   ??? Smear Review Comments 04/07/2020 See Comment* Undefined Final    Slide reviewed  Blasts Present.     ??? Hypersegmented Neutrophils 04/07/2020 Present* Not Present Final   ??? Unit Blood Type 04/07/2020 A Pos   Final   ??? ISBT Number 04/07/2020 [id number]   Final   ??? Unit # 04/07/2020 [id number]   Final   ??? Status 04/07/2020 Issued   Final   ???  Product ID 04/07/2020 Platelets   Final   ??? PRODUCT CODE 04/07/2020 [id number]   Final

## 2020-04-07 NOTE — Unmapped (Signed)
Pt to clinic for chemo today.  Aza administered sub-q to RLQ, pt tolerated well.  Platelets 30 today, pt received x1 unit of platelets completed at 1320, pt tolerated well.  Platelets re-drawn at 1358.  Care relinquished to [place].

## 2020-04-07 NOTE — Unmapped (Signed)
Brief Adult Oncology Infusion Clinic Note:    Re-assess on Howard Villarreal after epistaxis episode yesterday    Howard Villarreal reports no recurrent bleeding episodes. His wife states she has been giving him his tranexamic acid as ordered. She reports that she cannot pick up the prescription sent yesterday to continue taking as their insurance doesn't cover enough for it to be affordable.     We reviewed labs and Howard Villarreal had increase to plt 40k from plt 34k after transfusion yesterday. Repeat today down to 28k so will proceed with platelet transfusion. Howard Villarreal is extremely anxious and tearful about duration of infusion appointments, and needing to be home prior to dark as she doesn't drive in the dark. Due to unforseen delays, will transfuse 1 bag of platelets today without recheck of platelet count. Howard Villarreal will be back in clinic tomorrow for labs + D7 azacitidine. Will transfuse tomorrow as needed.     Howard Villarreal verbalizes understanding plan for today. She is aware that Howard Villarreal may need 2 units of platelets tomorrow while they remain on site for platelet recount.     Lavonda Jumbo, AGNP-BC  [facility]

## 2020-04-09 ENCOUNTER — Other Ambulatory Visit: Admit: 2020-04-09 | Discharge: 2020-04-10 | Payer: MEDICARE

## 2020-04-09 ENCOUNTER — Ambulatory Visit: Admit: 2020-04-09 | Discharge: 2020-04-10 | Payer: MEDICARE

## 2020-04-09 DIAGNOSIS — D471 Chronic myeloproliferative disease: Secondary | ICD-10-CM | POA: Diagnosis not present

## 2020-04-09 DIAGNOSIS — C9200 Acute myeloblastic leukemia, not having achieved remission: Secondary | ICD-10-CM | POA: Diagnosis not present

## 2020-04-09 LAB — CBC W/ AUTO DIFF
BASOPHILS ABSOLUTE COUNT: 0 10*9/L (ref 0.0–0.1)
BASOPHILS RELATIVE PERCENT: 0.4 %
EOSINOPHILS ABSOLUTE COUNT: 0 10*9/L (ref 0.0–0.4)
EOSINOPHILS RELATIVE PERCENT: 0.1 %
HEMATOCRIT: 30.7 % — ABNORMAL LOW (ref 41.0–53.0)
HEMOGLOBIN: 9.4 g/dL — ABNORMAL LOW (ref 13.5–17.5)
LARGE UNSTAINED CELLS: 3 % (ref 0–4)
LYMPHOCYTES ABSOLUTE COUNT: 1.3 10*9/L — ABNORMAL LOW (ref 1.5–5.0)
LYMPHOCYTES RELATIVE PERCENT: 39.6 %
MEAN CORPUSCULAR HEMOGLOBIN CONC: 30.7 g/dL — ABNORMAL LOW (ref 31.0–37.0)
MEAN CORPUSCULAR HEMOGLOBIN: 27.6 pg (ref 26.0–34.0)
MEAN CORPUSCULAR VOLUME: 89.8 fL (ref 80.0–100.0)
MEAN PLATELET VOLUME: 11.2 fL — ABNORMAL HIGH (ref 7.0–10.0)
MONOCYTES ABSOLUTE COUNT: 0.1 10*9/L — ABNORMAL LOW (ref 0.2–0.8)
MONOCYTES RELATIVE PERCENT: 4 %
PLATELET COUNT: 34 10*9/L — ABNORMAL LOW (ref 150–440)
RED BLOOD CELL COUNT: 3.41 10*12/L — ABNORMAL LOW (ref 4.50–5.90)
RED CELL DISTRIBUTION WIDTH: 22.8 % — ABNORMAL HIGH (ref 12.0–15.0)
WBC ADJUSTED: 3.2 10*9/L — ABNORMAL LOW (ref 4.5–11.0)

## 2020-04-09 LAB — PLATELET COUNT: Platelets:NCnc:Pt:Bld:Qn:Automated count: 46 — ABNORMAL LOW

## 2020-04-09 LAB — SMEAR REVIEW

## 2020-04-09 LAB — WBC ADJUSTED: Leukocytes:NCnc:Pt:Bld:Qn:: 3.2 — ABNORMAL LOW

## 2020-04-09 NOTE — Unmapped (Signed)
Patient arrived to chair 13 for transfusion.  No complaints noted.  Access of PIV intact with blood return.  Blood product consent done 04/03/20. Patient observed for first five minutes of infusion, VSS.  Post-transfusion platelet count drawn and sent. Second unit sent needed. Patient and wife refused to to stay for platelets that were ordered. Informed patient of current value that is less than recommended parameters. APP [place] paged and informed. Second unit of platelets returned to blood bank @1730 . PIV flushed and removed. AVS printed and patient discharged to home. NAD

## 2020-04-09 NOTE — Unmapped (Signed)
Brief Adult Oncology Infusion Clinic Note:    Follow up with Howard Villarreal    Epistaxis-  Howard Villarreal reports several incidents of small volume nosebleeding/dripping on Saturday PM and Sunday PM. He states his episodes like to occur at midnight. He was able to get the bleeding stopped with gentle pressure. Will continue platelet transfusion goal 50, with on-going primary team discussion regarding modifying goal. He denies any other bleeding. Wife currently trying to obtain tranexamic acid prescription from local pharmacy (primary team has verified PA in place and prescription ready for pickup).     Diarrhea-  Howard Villarreal reports Chron's disease with increase in diarrhea starting Sunday. He is having 4-5 episodes/day currently. He denies abdominal pain, blood in stool, or fever. He typically has loose stool, but only 1-2 times per day. Advised low fiber diet, adequate fluids, and conservative use of imodium (wife notes being advised in past to try 1/2 dose). He should also notify primary team or on-site infusion APP if frequent diarrhea persists for further management.     Howard Villarreal deny any other issues they feel need addressing today. They have questions about 'how long' he will be on injections, and when they can work with their local oncology office for some of these appointments. I indicated they are still in early days after completing cycle 1 and for now monitoring at [facility] is necessary. He is scheduled for follow up appointments on Wednesday 04/11/20.     Lavonda Jumbo, AGNP-BC  [facility]

## 2020-04-09 NOTE — Unmapped (Signed)
Lab on 04/09/2020   Component Date Value Ref Range Status   ??? ABO Grouping 04/09/2020 O POS   Final   ??? WBC 04/09/2020 3.2* 4.5 - 11.0 10*9/L Final   ??? RBC 04/09/2020 3.41* 4.50 - 5.90 10*12/L Final   ??? HGB 04/09/2020 9.4* 13.5 - 17.5 g/dL Final   ??? HCT 16/06/9603 30.7* 41.0 - 53.0 % Final   ??? MCV 04/09/2020 89.8  80.0 - 100.0 fL Final   ??? MCH 04/09/2020 27.6  26.0 - 34.0 pg Final   ??? MCHC 04/09/2020 30.7* 31.0 - 37.0 g/dL Final   ??? RDW 54/05/8118 22.8* 12.0 - 15.0 % Final   ??? MPV 04/09/2020 11.2* 7.0 - 10.0 fL Final   ??? Platelet 04/09/2020 34* 150 - 440 10*9/L Final   ??? Variable HGB Concentration 04/09/2020 Moderate* Not Present Final   ??? Neutrophils % 04/09/2020 52.6  % Final   ??? Lymphocytes % 04/09/2020 39.6  % Final   ??? Monocytes % 04/09/2020 4.0  % Final   ??? Eosinophils % 04/09/2020 0.1  % Final   ??? Basophils % 04/09/2020 0.4  % Final   ??? Absolute Neutrophils 04/09/2020 1.7* 2.0 - 7.5 10*9/L Final   ??? Absolute Lymphocytes 04/09/2020 1.3* 1.5 - 5.0 10*9/L Final   ??? Absolute Monocytes 04/09/2020 0.1* 0.2 - 0.8 10*9/L Final   ??? Absolute Eosinophils 04/09/2020 0.0  0.0 - 0.4 10*9/L Final   ??? Absolute Basophils 04/09/2020 0.0  0.0 - 0.1 10*9/L Final   ??? Large Unstained Cells 04/09/2020 3  0 - 4 % Final   ??? Microcytosis 04/09/2020 Slight* Not Present Final   ??? Macrocytosis 04/09/2020 Moderate* Not Present Final   ??? Anisocytosis 04/09/2020 Marked* Not Present Final   ??? Hypochromasia 04/09/2020 Marked* Not Present Final   ??? Smear Review Comments 04/09/2020 See Comment* Undefined Final    Slide Reviewed.  Blasts Present.  Promyelocytes present - rare.     ??? Basophilic Stippling 04/09/2020 Present* Not Present Final   Hospital Outpatient Visit on 04/09/2020   Component Date Value Ref Range Status   ??? Unit Blood Type 04/09/2020 A Pos   Final   ??? ISBT Number 04/09/2020 [id number]   Final   ??? Unit # 04/09/2020 [id number]   Final   ??? Status 04/09/2020 Ready   Final   ??? Spec Expiration 04/09/2020 [id number]   Final   ??? Product ID 04/09/2020 Platelets   Final   ??? PRODUCT CODE 04/09/2020 [id number]   Final

## 2020-04-09 NOTE — Unmapped (Unsigned)
Care provided by Bonita Quin LPN. PIV placed and labs drawn as ordered.

## 2020-04-10 NOTE — Unmapped (Signed)
[name] contacted the [employer] requesting to speak with the care team of Howard Villarreal to discuss:    [name] that never got the 2nd bag of plateletes and they were told to let us know so maybe they can get it wihle here tomorrow.    Please contact at [phone].    Program: Heme Malignancy  Speciality: Medical Oncology    Check Indicates criteria has been reviewed and confirmed with the patient:    []  Preferred Name   [x]  DOB and/or MR#  [x]  Preferred Contact Method  [x]  Phone Number(s)   []  MyChart     Thank you,   Vernie Ammons  [facility]   [phone]

## 2020-04-10 NOTE — Unmapped (Signed)
[facility] Triage Note     Patient: Howard Villarreal     Reason for call:  return call    Time call returned: 1530     Phone Assessment: pt wife, [name] calling to inquire about getting another bag of platelets in the morning when they come in to see Becky.  They waited several hours for the second bag yesterday, but at 1730 they needed to leave in able to be at their drug store before closing. They were not able to wait for the second bag yesterday but the second platelet count was 46 (goal was 50).     Goal for this communication:  Give heads up in case pt needs platelets tomorrow.     Triage Recommendations: RN will let team know to keep an eye out for lab values in case an infusion is needed.

## 2020-04-11 ENCOUNTER — Ambulatory Visit: Admit: 2020-04-11 | Discharge: 2020-04-12 | Payer: MEDICARE | Attending: Pharmacist | Primary: Pharmacist

## 2020-04-11 ENCOUNTER — Ambulatory Visit: Admit: 2020-04-11 | Discharge: 2020-04-12 | Payer: MEDICARE

## 2020-04-11 ENCOUNTER — Ambulatory Visit: Admit: 2020-04-11 | Discharge: 2020-04-12 | Payer: MEDICARE | Attending: Adult Health | Primary: Adult Health

## 2020-04-11 ENCOUNTER — Other Ambulatory Visit: Admit: 2020-04-11 | Discharge: 2020-04-12 | Payer: MEDICARE

## 2020-04-11 DIAGNOSIS — Z791 Long term (current) use of non-steroidal anti-inflammatories (NSAID): Secondary | ICD-10-CM | POA: Diagnosis not present

## 2020-04-11 DIAGNOSIS — Z792 Long term (current) use of antibiotics: Secondary | ICD-10-CM | POA: Diagnosis not present

## 2020-04-11 DIAGNOSIS — Z87891 Personal history of nicotine dependence: Secondary | ICD-10-CM | POA: Diagnosis not present

## 2020-04-11 DIAGNOSIS — N183 Chronic kidney disease, stage 3 unspecified: Secondary | ICD-10-CM | POA: Diagnosis not present

## 2020-04-11 DIAGNOSIS — K5090 Crohn's disease, unspecified, without complications: Secondary | ICD-10-CM | POA: Diagnosis not present

## 2020-04-11 DIAGNOSIS — D45 Polycythemia vera: Secondary | ICD-10-CM | POA: Diagnosis not present

## 2020-04-11 DIAGNOSIS — Z79899 Other long term (current) drug therapy: Secondary | ICD-10-CM | POA: Diagnosis not present

## 2020-04-11 DIAGNOSIS — D471 Chronic myeloproliferative disease: Secondary | ICD-10-CM | POA: Diagnosis not present

## 2020-04-11 DIAGNOSIS — C9200 Acute myeloblastic leukemia, not having achieved remission: Secondary | ICD-10-CM | POA: Diagnosis not present

## 2020-04-11 DIAGNOSIS — K5010 Crohn's disease of large intestine without complications: Secondary | ICD-10-CM | POA: Diagnosis not present

## 2020-04-11 LAB — CBC W/ AUTO DIFF
HEMATOCRIT: 27 % — ABNORMAL LOW (ref 41.0–53.0)
HEMOGLOBIN: 8.5 g/dL — ABNORMAL LOW (ref 13.5–17.5)
MEAN CORPUSCULAR HEMOGLOBIN CONC: 31.5 g/dL (ref 31.0–37.0)
MEAN CORPUSCULAR HEMOGLOBIN: 27.8 pg (ref 26.0–34.0)
MEAN PLATELET VOLUME: 15.3 fL — ABNORMAL HIGH (ref 7.0–10.0)
PLATELET COUNT: 26 10*9/L — ABNORMAL LOW (ref 150–440)
RED BLOOD CELL COUNT: 3.06 10*12/L — ABNORMAL LOW (ref 4.50–5.90)
RED CELL DISTRIBUTION WIDTH: 22.7 % — ABNORMAL HIGH (ref 12.0–15.0)
WBC ADJUSTED: 2.2 10*9/L — ABNORMAL LOW (ref 4.5–11.0)

## 2020-04-11 LAB — COMPREHENSIVE METABOLIC PANEL
ALBUMIN: 3 g/dL — ABNORMAL LOW (ref 3.4–5.0)
ALKALINE PHOSPHATASE: 82 U/L (ref 46–116)
ANION GAP: 5 mmol/L (ref 5–14)
AST (SGOT): 29 U/L (ref <=34)
BILIRUBIN TOTAL: 0.6 mg/dL (ref 0.3–1.2)
BLOOD UREA NITROGEN: 51 mg/dL — ABNORMAL HIGH (ref 9–23)
BUN / CREAT RATIO: 36
CALCIUM: 9.1 mg/dL (ref 8.7–10.4)
CHLORIDE: 105 mmol/L (ref 98–107)
CO2: 23 mmol/L (ref 20.0–31.0)
CREATININE: 1.42 mg/dL — ABNORMAL HIGH
EGFR CKD-EPI AA MALE: 56 mL/min/{1.73_m2} — ABNORMAL LOW (ref >=60)
EGFR CKD-EPI NON-AA MALE: 49 mL/min/{1.73_m2} — ABNORMAL LOW (ref >=60)
GLUCOSE RANDOM: 92 mg/dL (ref 70–179)
POTASSIUM: 4.8 mmol/L — ABNORMAL HIGH (ref 3.4–4.5)
PROTEIN TOTAL: 8.4 g/dL — ABNORMAL HIGH (ref 5.7–8.2)
SODIUM: 133 mmol/L — ABNORMAL LOW (ref 135–145)

## 2020-04-11 LAB — MICROCYTES

## 2020-04-11 LAB — PHOSPHORUS: Phosphate:MCnc:Pt:Ser/Plas:Qn:: 4.3

## 2020-04-11 LAB — PLATELET COUNT: Platelets:NCnc:Pt:Bld:Qn:Automated count: 33 — ABNORMAL LOW

## 2020-04-11 LAB — MANUAL DIFFERENTIAL
BASOPHILS - ABS (DIFF): 0 10*9/L (ref 0.0–0.1)
BASOPHILS - REL (DIFF): 2 %
BLASTS - REL (DIFF): 4 % (ref <=0)
EOSINOPHILS - ABS (DIFF): 0 10*9/L (ref 0.0–0.4)
EOSINOPHILS - REL (DIFF): 0 %
LYMPHOCYTES - REL (DIFF): 51 %
MONOCYTES - ABS (DIFF): 0.1 10*9/L — ABNORMAL LOW (ref 0.2–0.8)
NEUTROPHILS - REL (DIFF): 37 %

## 2020-04-11 LAB — LACTATE DEHYDROGENASE: Lactate dehydrogenase:CCnc:Pt:Ser/Plas:Qn:Reaction: pyruvate to lactate: 188

## 2020-04-11 LAB — LYMPHOCYTES - REL (DIFF): Lymphocytes/100 leukocytes:NFr:Pt:Bld:Qn:Manual count: 51

## 2020-04-11 LAB — ALT (SGPT): Alanine aminotransferase:CCnc:Pt:Ser/Plas:Qn:: 16

## 2020-04-11 LAB — URIC ACID: Urate:MCnc:Pt:Ser/Plas:Qn:: 4.6

## 2020-04-11 MED ORDER — TRANEXAMIC ACID 650 MG TABLET
ORAL_TABLET | Freq: Three times a day (TID) | ORAL | 2 refills | 7 days | Status: CP
Start: 2020-04-11 — End: 2020-04-18

## 2020-04-11 NOTE — Unmapped (Signed)
Continue venetoclax 400 mg (4 tablets) once daily with food.     For prevention of infection, continue valacyclovir. You may get posaconazole in the mail from the manufacturer (from free!). Don't start until you see me again, we will have to adjust the dose of the venetoclax at that time.    Continue allopurinol 150 mg (which is a half tablet) once daily. Can STOP sevelamer for now.     We will work on fixing your schedule.     I sent refills of tranexamic acid to [facility]     For urgent questions or concerns please call us at [phone]. Can use mychart for non-urgent questions.    Lab on 04/11/2020   Component Date Value Ref Range Status   ??? Sodium 04/11/2020 133* 135 - 145 mmol/L Final   ??? Potassium 04/11/2020 4.8* 3.4 - 4.5 mmol/L Final   ??? Chloride 04/11/2020 105  98 - 107 mmol/L Final   ??? Anion Gap 04/11/2020 5  5 - 14 mmol/L Final   ??? CO2 04/11/2020 23.0  20.0 - 31.0 mmol/L Final   ??? BUN 04/11/2020 51* 9 - 23 mg/dL Final   ??? Creatinine 04/11/2020 1.42* 0.60 - 1.10 mg/dL Final   ??? BUN/Creatinine Ratio 04/11/2020 36   Final   ??? EGFR CKD-EPI Non-African American,* 04/11/2020 49* >=60 mL/min/1.9m2 Final   ??? EGFR CKD-EPI African American, Male 04/11/2020 56* >=60 mL/min/1.32m2 Final   ??? Glucose 04/11/2020 92  70 - 179 mg/dL Final   ??? Calcium 09/81/1914 9.1  8.7 - 10.4 mg/dL Final   ??? Albumin 78/29/5621 3.0* 3.4 - 5.0 g/dL Final   ??? Total Protein 04/11/2020 8.4* 5.7 - 8.2 g/dL Final   ??? Total Bilirubin 04/11/2020 0.6  0.3 - 1.2 mg/dL Final   ??? AST 30/86/5784 29  <=34 U/L Final   ??? ALT 04/11/2020 16  10 - 49 U/L Final   ??? Alkaline Phosphatase 04/11/2020 82  46 - 116 U/L Final   ??? Phosphorus 04/11/2020 4.3  2.4 - 5.1 mg/dL Final   ??? LDH 69/62/9528 188  120 - 246 U/L Final   ??? Uric Acid 04/11/2020 4.6  3.7 - 9.2 mg/dL Final   ??? WBC 41/32/4401 2.2* 4.5 - 11.0 10*9/L Final   ??? RBC 04/11/2020 3.06* 4.50 - 5.90 10*12/L Final   ??? HGB 04/11/2020 8.5* 13.5 - 17.5 g/dL Final   ??? HCT 02/72/5366 27.0* 41.0 - 53.0 % Final   ??? MCV 04/11/2020 88.2  80.0 - 100.0 fL Final   ??? MCH 04/11/2020 27.8  26.0 - 34.0 pg Final   ??? MCHC 04/11/2020 31.5  31.0 - 37.0 g/dL Final   ??? RDW 44/11/4740 22.7* 12.0 - 15.0 % Final   ??? MPV 04/11/2020 15.3* 7.0 - 10.0 fL Final   ??? Platelet 04/11/2020 26* 150 - 440 10*9/L Final   ??? Variable HGB Concentration 04/11/2020 Moderate* Not Present Final   ??? Neutrophil Left Shift 04/11/2020 1+* Not Present Final   ??? Microcytosis 04/11/2020 Slight* Not Present Final   ??? Macrocytosis 04/11/2020 Slight* Not Present Final   ??? Anisocytosis 04/11/2020 Marked* Not Present Final   ??? Hypochromasia 04/11/2020 Marked* Not Present Final   ??? Neutrophils % 04/11/2020 37  % Final   ??? Lymphocytes % 04/11/2020 51  % Final   ??? Monocytes % 04/11/2020 6  % Final   ??? Eosinophils % 04/11/2020 0  % Final   ??? Basophils % 04/11/2020 2  % Final   ???  Blasts % 04/11/2020 4* <=0 % Final   ??? Absolute Neutrophils 04/11/2020 0.8* 2.0 - 7.5 10*9/L Final   ??? Absolute Lymphocytes 04/11/2020 1.1* 1.5 - 5.0 10*9/L Final   ??? Absolute Monocytes 04/11/2020 0.1* 0.2 - 0.8 10*9/L Final   ??? Absolute Eosinophils 04/11/2020 0.0  0.0 - 0.4 10*9/L Final   ??? Absolute Basophils 04/11/2020 0.0  0.0 - 0.1 10*9/L Final   ??? Smear Review Comments 04/11/2020 See Comment* Undefined Final    Slide reviewed.  Blasts Present.

## 2020-04-11 NOTE — Unmapped (Signed)
Malignant Hematology Established Visit      Patient: Howard Villarreal  MRN: [id number]  DOB: 07-25-1946  Date of Visit: 04/11/2020    Local Oncologist: Dr. Nelva Nay    Reason for Visit   Howard Villarreal is a 74 y.o. 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  Nosebleeds have been controlled, and feels okay other than fatigue.  Platelets are 26k, so will plan to transfuse to ~50k.  ANC 0.8, no prophy needed yet.  Will also meet with [name], CPP.    Plan   1. Continue Cycle 1 Aza/Ven.  Currently C1D10  2. Continue valacyclovir  3. Transfuse today for plts 26k  4. RTC in 1 week    [name], RN, MSN, AGPCNP-C  Nurse Practitioner  Hematologic Malignancies  [facility]  [phone] (phone)  [phone] (fax)  [name]    I personally spent 45 minutes face-to-face and non-face-to-face in the care of this patient, which includes all pre, intra, and post visit time on the date of service.    Interval History:  Doing fair.  Continues to have nosebleeds, but none since Howard Villarreal last ER trip.  Has an appt with ENT tomorrow.    Some mild bleeding.  He does have a lot of fatigue and is somewhat lethargic.  Bruises very easily.  Doing fairly well with eating.  Mild diarrhea 2/2 crohns.      Otherwise, denies new bone pain, fevers, chills, night sweats, lumps/bumps, tongue swelling, shortness of breath, syncope, lightheadedness, constipation or diarrhea, nausea or vomiting, very easy bruising or bleeding, or urinary changes.       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 He has had polycythemia since at least 2013 (no labs prior to that, but on 08/24/12, WBC 21, Hgb 19.3, Hct 59.8%, Plts 551). Hematocrit was 62.1 with a hemoglobin of 19.8 on 03/28/2015. 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 Erythropoietin level was 1.1 (low).     He began a phlebotomy program on 03/28/2015 when he was seen in consultation by Dr. Merlene Pulling and diagnosed with PV to maintain a hematocrit goal of < 45%; he was likely iron deficient at diagnosis, with MCV 102fL.   He last underwent phlebotomy on 05/02/2015. He is maintained on aspirin 81mg  po daily without significant bleeding nor dyspepsia.     He is followed closely by GI (Dr. Mechele Collin) for a history of polyps and Crohn's disease. Last colonoscopy was 2012. He has become iron deficient on Howard Villarreal phlebotomy program (ferritin 21 on 11/28/2015).     CBC on 11/28/2015 revealed a hematocrit of 39.3, hemoglobin 11.9, platelets 463,000, WBC 32,000 with an ANC of 25,000. Differential included 71% segs, 4% lymphocytes and 17% monocytes. Peripheral smear revealed leukocytosis with predominantly mature neutrophils, increased monocytes and rare blasts (<1%).    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 Marrow was packed (95-100% cellularity) with pan myelosis, multi-lineage dyspoiesis, and no significant increase in blasts. There was moderate to focally marked reticulin fibrosis (grade 2-3/3). Storage iron was not identified. Flow cytometry revealed non-specific atypical myeloid findings  with no increase in blasts. 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 Cytogenetics were normal (46, XY).    He was briefly on hydroxyurea 500 mg a day (12/05/2015 - 12/19/2015). He began allopurinol on 12/19/2015. Uric acid has decreased from 12 to 5.8 to 5.1. Creatinine is stable at 1.62 (CrCl 42 ml/min). LDH was 466 on 01/02/2016. Hematocrit is drifting down (36.9 to 35.4). Platelets are 228,000 and WBC of 21,700.    [place] [facility]ematopathology review of the bmbx:  Morphologic evaluation, together with review of outside reports of flow cytometry, cytogenetics, and our review of outside special stains, support the above interpretation. Morphologic evaluation of the aspirate smear may be limited by suboptimal/weak staining. While the aspirate differential count does not reflect an increase in blasts, there are scattered immature appearing mononuclear cells that may represent hypogranular and/or maturing granulocytes. Megakaryocytes are dysplastic, showing numerous hypo-loabted and multi-nucleated forms as would be seen in myelodysplastic syndrome, with relatively infrequent hyper-lobated forms. Erythroid precursors show at least mild dysplasia. Granulocytic precursors may show hypogranularity. The bone marrow biopsy demonstrates a hypercellular marrow with increased reticulin fibrosis. Overall, while it is difficult to exclude a post-polycythemic myelofibrosis with progression to dysplasia given the patient's history, an overlap myeloproliferative/myedysplastic neoplasm would also be in the differential. In addition, flow cytometric, CD34 immunohistochemistry on the biopsy and marrow aspirate differential show no definitive increase in blasts, the possible suboptimal staining of the aspirate smear and/or true hypogranularity of the granulocytes may lead to an underestimate of these forms. Repeat marrow evaluation and correlation with myeloid mutation panel (MDS/MPN) is recommended.??    Howard Villarreal bone marrow biopsy from 04/07/16 showed a >95% cellular marrow with 5% blasts by manual aspirate differential, mild reticulin fibrosis (but not enough to classify as MF), mild dyserythropoiesis, hypogranular neutrophils (but not enough to classify as MDS) and molecular mutations in: JAK2 V617F, IDH2, RUNX1, SRSF2.  Mutations in RUNX1 and SRSF2 are associated with poorer prognosis and shorter survival in MPN.  However, median overall survival with this is still reasonably long, estimated at 10 years assuming one still classifies this as PV, which technically it is  [[email or web address]].  I do think what we are seeing is PV evolving to MF (especially given the prior outside bmbx that showed moderated to marked fibrotic changes, but only minimal on our sampling plus the presence of nucleated RBCs in the periphery and elevated LDH.    Assuming this were MF, he would be classified as Intermediate-2 risk by DIPPS and the same by DIPPS Plus, noting that he does have a higher-risk mutational profile with RUNX1 and SRSF2 (though only ASXL1 remained significant in the follow-up analyses of molecular markers).      Review of Systems     Wt Readings from Last 12 Encounters:   04/11/20 67.5 kg (148 lb 12.8 oz)   04/09/20 67 kg (147 lb 11.3 oz)   04/07/20 67.7 kg (149 lb 2.3 oz)   04/05/20 71 kg (156 lb 9.6 oz)   04/03/20 73.1 kg (161 lb 1.6 oz)   03/28/20 72.1 kg (158 lb 15.2 oz)   03/12/20 74.8 kg (164 lb 12.8 oz)   03/06/20 76.2 kg (168 lb 1.6 oz)   07/14/19 76 kg (167 lb 8 oz)   07/14/19 75.9 kg (167 lb 5.3 oz)   07/11/19 75.8 kg (167 lb 3.2 oz)   08/09/18 84.8 kg (187 lb 1 oz)     + unintentional weight loss  Very fatigued - has not excerised much at all.  Did make it one mile a few weeks ago.  Has had a pneumonia ~3 months with coughing.  Took azithromycin last week with improvement and now amoxicillin.    (-) fever  (-) night sweats  Intermittent appetite - not clear if is related to early satiety.   No abdominal pain.   Sometimes swollen feet.  Had improved with ruxolitinib but not fedratinib.   No orthopnea. But is not able to speak in full sentences.     Urinary catheter - has been present for a long time.  BPH.    No falls.    No mucocutaneous bleeding, but is bruising easily. Had nose bleeding cauterized and hasn't returned.         10 systems reviewed and negative except as noted in the HPI and Interval History. Medications     Current Outpatient Medications   Medication Sig Dispense Refill   ??? acetaminophen (TYLENOL) 500 MG tablet Take 500 mg by mouth every eight (8) hours as needed.     ??? allopurinoL (ZYLOPRIM) 300 MG tablet Take 150 mg by mouth daily.      ??? cyanocobalamin 1000 MCG tablet Take 1,000 mcg by mouth daily.      ??? finasteride (PROSCAR) 5 mg tablet Take 5 mg by mouth daily with evening meal.     ??? folic acid (FOLVITE) 1 MG tablet Take 1 mg by mouth daily.     ??? gentamicin (GARAMYCIN) 0.1 % ointment SMARTSIG:1 Topical Every Night     ??? latanoprost (XALATAN) 0.005 % ophthalmic solution 1 drop nightly.     ??? posaconazole (NOXAFIL) 100 mg TbEC delayed released tablet Take 3 tablets (300 mg) by mouth daily. (Patient not taking: Reported on 04/11/2020) 90 tablet 1   ??? sodium bicarbonate 650 mg tablet Take 650 mg by mouth Two (2) times a day.     ??? timolol (TIMOPTIC) 0.5 % ophthalmic solution Apply 1 drop to eye daily.      ??? tranexamic acid 650 mg Tab tablet Take 2 tablets (1,300 mg total) by mouth Three (3) times a day for 7 days. 42 tablet 2   ??? valACYclovir (VALTREX) 500 MG tablet Take 1 tablet (500 mg total) by mouth daily. 30 tablet 11   ??? venetoclax (VENCLEXTA) 100 mg tablet Take 4 tablets (400 mg total) by mouth daily. Take with a meal and water. Do not chew, crush, or break tablets. 120 tablet 1     No current facility-administered medications for this visit.         Allergies   No Known Allergies    Past Medical and Surgical History     Past Medical History:   Diagnosis Date   ??? 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 CKD (chronic kidney disease) stage 3, GFR 30-59 ml/min    ??? Crohn's colitis (CMS-HCC) 1986    No surgeries. Was on 6-MP for awhile; hasn't been on anything 2005.   ??? Glaucoma    ??? 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      Past Surgical History:   Procedure Laterality Date   ??? MOHS SURGERY  2016    left ear for non-melanoma skin cancer Social History     Social History     Socioeconomic History   ??? Marital status: Married     Spouse name: Not on file   ??? Number  of children: Not on file   ??? Years of education: Not on file   ??? Highest education level: Not on file   Occupational History   ??? Not on file   Tobacco Use   ??? Smoking status: Former Smoker   ??? Smokeless tobacco: Never Used   Vaping Use   ??? Vaping Use: Never used   Substance and Sexual Activity   ??? Alcohol use: Yes     Alcohol/week: 4.0 standard drinks     Types: 4 Glasses of wine per week     Comment: occasional    ??? Drug use: Not on file   ??? Sexual activity: Not on file   Other Topics Concern   ??? Not on file   Social History Narrative   ??? Not on file     Social Determinants of Health     Financial Resource Strain: Low Risk    ??? Difficulty of Paying Living Expenses: Not hard at all   Food Insecurity: No Food Insecurity   ??? Worried About Programme researcher, broadcasting/film/video in the Last Year: Never true   ??? Ran Out of Food in the Last Year: Never true   Transportation Needs: No Transportation Needs   ??? Lack of Transportation (Medical): No   ??? Lack of Transportation (Non-Medical): No   Physical Activity:    ??? Days of Exercise per Week:    ??? Minutes of Exercise per Session:    Stress:    ??? Feeling of Stress :    Social Connections:    ??? Frequency of Communication with Friends and Family:    ??? Frequency of Social Gatherings with Friends and Family:    ??? Attends Religious Services:    ??? Database administrator or Organizations:    ??? Attends Banker Meetings:    ??? Marital Status:    Former Audiological scientist; retired several years ago.  Lives with Howard Villarreal wife.        Physical Examination     There were no vitals filed for this visit.  GENERAL:  Quite thin - weight was recorded at 168 here, but 153 at home.  Howard Villarreal waist is 32. Accompanied by Howard Villarreal wife.  In no distress.  Pleasant and alert.   RESP: Breathing comfortably.  Speaking comfortably in full sentences.   During the 60 minute visit, 2 episodes of ~3 seconds of non-productive cough.    NEURO: A&Ox4.  CN II-XII grossly intact and symmetric.   PSYCH: Normal affect, mood is so so.  Forward thinking.  Goal-directed and linear.   EXT:  1+ pedal edema bilaterally.   SKIN:  Some bruising on arms, but no petechiae, no rashes.   .     Laboratory Testing and Imaging     Lab on 04/11/2020   Component Date Value Ref Range Status   ??? Sodium 04/11/2020 133* 135 - 145 mmol/L Final   ??? Potassium 04/11/2020 4.8* 3.4 - 4.5 mmol/L Final   ??? Chloride 04/11/2020 105  98 - 107 mmol/L Final   ??? Anion Gap 04/11/2020 5  5 - 14 mmol/L Final   ??? CO2 04/11/2020 23.0  20.0 - 31.0 mmol/L Final   ??? BUN 04/11/2020 51* 9 - 23 mg/dL Final   ??? Creatinine 04/11/2020 1.42* 0.60 - 1.10 mg/dL Final   ??? BUN/Creatinine Ratio 04/11/2020 36   Final   ??? EGFR CKD-EPI Non-African American,* 04/11/2020 49* >=60 mL/min/1.61m2 Final   ??? EGFR CKD-EPI African  American, Male 04/11/2020 56* >=60 mL/min/1.62m2 Final   ??? Glucose 04/11/2020 92  70 - 179 mg/dL Final   ??? Calcium 16/06/9603 9.1  8.7 - 10.4 mg/dL Final   ??? Albumin 54/05/8118 3.0* 3.4 - 5.0 g/dL Final   ??? Total Protein 04/11/2020 8.4* 5.7 - 8.2 g/dL Final   ??? Total Bilirubin 04/11/2020 0.6  0.3 - 1.2 mg/dL Final   ??? AST 14/78/2956 29  <=34 U/L Final   ??? ALT 04/11/2020 16  10 - 49 U/L Final   ??? Alkaline Phosphatase 04/11/2020 82  46 - 116 U/L Final   ??? Phosphorus 04/11/2020 4.3  2.4 - 5.1 mg/dL Final   ??? LDH 21/30/8657 188  120 - 246 U/L Final   ??? Uric Acid 04/11/2020 4.6  3.7 - 9.2 mg/dL Final   ??? WBC 84/69/6295 2.2* 4.5 - 11.0 10*9/L Final   ??? RBC 04/11/2020 3.06* 4.50 - 5.90 10*12/L Final   ??? HGB 04/11/2020 8.5* 13.5 - 17.5 g/dL Final   ??? HCT 28/41/3244 27.0* 41.0 - 53.0 % Final   ??? MCV 04/11/2020 88.2  80.0 - 100.0 fL Final   ??? MCH 04/11/2020 27.8  26.0 - 34.0 pg Final   ??? MCHC 04/11/2020 31.5  31.0 - 37.0 g/dL Final   ??? RDW 09/24/7251 22.7* 12.0 - 15.0 % Final   ??? MPV 04/11/2020 15.3* 7.0 - 10.0 fL Final   ??? Platelet 04/11/2020 26* 150 - 440 10*9/L Final   ??? Variable HGB Concentration 04/11/2020 Moderate* Not Present Final   ??? Neutrophil Left Shift 04/11/2020 1+* Not Present Final   ??? Microcytosis 04/11/2020 Slight* Not Present Final   ??? Macrocytosis 04/11/2020 Slight* Not Present Final   ??? Anisocytosis 04/11/2020 Marked* Not Present Final   ??? Hypochromasia 04/11/2020 Marked* Not Present Final   ??? Neutrophils % 04/11/2020 37  % Final   ??? Lymphocytes % 04/11/2020 51  % Final   ??? Monocytes % 04/11/2020 6  % Final   ??? Eosinophils % 04/11/2020 0  % Final   ??? Basophils % 04/11/2020 2  % Final   ??? Blasts % 04/11/2020 4* <=0 % Final   ??? Absolute Neutrophils 04/11/2020 0.8* 2.0 - 7.5 10*9/L Final   ??? Absolute Lymphocytes 04/11/2020 1.1* 1.5 - 5.0 10*9/L Final   ??? Absolute Monocytes 04/11/2020 0.1* 0.2 - 0.8 10*9/L Final   ??? Absolute Eosinophils 04/11/2020 0.0  0.0 - 0.4 10*9/L Final   ??? Absolute Basophils 04/11/2020 0.0  0.0 - 0.1 10*9/L Final   ??? Smear Review Comments 04/11/2020 See Comment* Undefined Final    Slide reviewed.  Blasts Present.    Hospital Outpatient Visit on 04/09/2020   Component Date Value Ref Range Status   ??? Platelet 04/09/2020 46* 150 - 440 10*9/L Final   ??? Unit Blood Type 04/10/2020 A Pos   Final   ??? ISBT Number 04/10/2020 6200   Final   ??? Unit # 04/10/2020 [id number]   Final   ??? Status 04/10/2020 Transfused   Final   ??? Product ID 04/10/2020 Platelets   Final   ??? PRODUCT CODE 04/10/2020 [id number]   Final   ??? Unit Blood Type 04/09/2020 O Pos   Final   ??? ISBT Number 04/09/2020 5100   Final   ??? Unit # 04/09/2020 [id number]   Final   ??? Status 04/09/2020 Returned from Issue   Final   Lab on 04/09/2020   Component Date Value Ref Range Status   ??? ABO Grouping  04/09/2020 O POS   Final   ??? Antibody Screen 04/09/2020 NEG   Final   ??? WBC 04/09/2020 3.2* 4.5 - 11.0 10*9/L Final   ??? RBC 04/09/2020 3.41* 4.50 - 5.90 10*12/L Final   ??? HGB 04/09/2020 9.4* 13.5 - 17.5 g/dL Final   ??? HCT 96/29/5284 30.7* 41.0 - 53.0 % Final   ??? MCV 04/09/2020 89.8 80.0 - 100.0 fL Final   ??? MCH 04/09/2020 27.6  26.0 - 34.0 pg Final   ??? MCHC 04/09/2020 30.7* 31.0 - 37.0 g/dL Final   ??? RDW 13/24/4010 22.8* 12.0 - 15.0 % Final   ??? MPV 04/09/2020 11.2* 7.0 - 10.0 fL Final   ??? Platelet 04/09/2020 34* 150 - 440 10*9/L Final   ??? Variable HGB Concentration 04/09/2020 Moderate* Not Present Final   ??? Neutrophils % 04/09/2020 52.6  % Final   ??? Lymphocytes % 04/09/2020 39.6  % Final   ??? Monocytes % 04/09/2020 4.0  % Final   ??? Eosinophils % 04/09/2020 0.1  % Final   ??? Basophils % 04/09/2020 0.4  % Final   ??? Absolute Neutrophils 04/09/2020 1.7* 2.0 - 7.5 10*9/L Final   ??? Absolute Lymphocytes 04/09/2020 1.3* 1.5 - 5.0 10*9/L Final   ??? Absolute Monocytes 04/09/2020 0.1* 0.2 - 0.8 10*9/L Final   ??? Absolute Eosinophils 04/09/2020 0.0  0.0 - 0.4 10*9/L Final   ??? Absolute Basophils 04/09/2020 0.0  0.0 - 0.1 10*9/L Final   ??? Large Unstained Cells 04/09/2020 3  0 - 4 % Final   ??? Microcytosis 04/09/2020 Slight* Not Present Final   ??? Macrocytosis 04/09/2020 Moderate* Not Present Final   ??? Anisocytosis 04/09/2020 Marked* Not Present Final   ??? Hypochromasia 04/09/2020 Marked* Not Present Final   ??? Smear Review Comments 04/09/2020 See Comment* Undefined Final    Slide Reviewed.  Blasts Present.  Promyelocytes present - rare.     ??? Basophilic Stippling 04/09/2020 Present* Not Present Final   Hospital Outpatient Visit on 04/07/2020   Component Date Value Ref Range Status   ??? Sodium 04/07/2020 131* 135 - 145 mmol/L Final   ??? Potassium 04/07/2020 4.3  3.5 - 5.1 mmol/L Final   ??? Chloride 04/07/2020 103  98 - 107 mmol/L Final   ??? Anion Gap 04/07/2020 7  5 - 14 mmol/L Final   ??? CO2 04/07/2020 21.0  20.0 - 31.0 mmol/L Final   ??? BUN 04/07/2020 53* 9 - 23 mg/dL Final   ??? Creatinine 04/07/2020 1.45* 0.60 - 1.10 mg/dL Final   ??? BUN/Creatinine Ratio 04/07/2020 37   Final   ??? EGFR CKD-EPI Non-African American,* 04/07/2020 47* >=60 mL/min/1.9m2 Final   ??? EGFR CKD-EPI African American, Male 04/07/2020 55* >=60 mL/min/1.20m2 Final   ??? Glucose 04/07/2020 92  70 - 179 mg/dL Final   ??? Calcium 27/25/3664 8.9  8.7 - 10.4 mg/dL Final   ??? Albumin 40/34/7425 2.8* 3.4 - 5.0 g/dL Final   ??? Total Protein 04/07/2020 8.0  5.7 - 8.2 g/dL Final   ??? Total Bilirubin 04/07/2020 0.8  0.3 - 1.2 mg/dL Final   ??? AST 95/63/8756 34  <=34 U/L Final   ??? ALT 04/07/2020 12  10 - 49 U/L Final   ??? Alkaline Phosphatase 04/07/2020 82  46 - 116 U/L Final   ??? WBC 04/07/2020 6.1  4.5 - 11.0 10*9/L Final   ??? RBC 04/07/2020 3.68* 4.50 - 5.90 10*12/L Final   ??? HGB 04/07/2020 10.3* 13.5 - 17.5 g/dL Final   ???  HCT 04/07/2020 33.0* 41.0 - 53.0 % Final   ??? MCV 04/07/2020 89.6  80.0 - 100.0 fL Final   ??? MCH 04/07/2020 28.0  26.0 - 34.0 pg Final   ??? MCHC 04/07/2020 31.2  31.0 - 37.0 g/dL Final   ??? RDW 16/06/9603 23.4* 12.0 - 15.0 % Final   ??? MPV 04/07/2020 8.4  7.0 - 10.0 fL Final   ??? Platelet 04/07/2020 30* 150 - 440 10*9/L Final   ??? Variable HGB Concentration 04/07/2020 Moderate* Not Present Final   ??? Neutrophils % 04/07/2020 78.8  % Final   ??? Lymphocytes % 04/07/2020 15.5  % Final   ??? Monocytes % 04/07/2020 1.7  % Final   ??? Eosinophils % 04/07/2020 0.3  % Final   ??? Basophils % 04/07/2020 0.5  % Final   ??? Neutrophil Left Shift 04/07/2020 2+* Not Present Final   ??? Absolute Neutrophils 04/07/2020 4.8  2.0 - 7.5 10*9/L Final   ??? Absolute Lymphocytes 04/07/2020 0.9* 1.5 - 5.0 10*9/L Final   ??? Absolute Monocytes 04/07/2020 0.1* 0.2 - 0.8 10*9/L Final   ??? Absolute Eosinophils 04/07/2020 0.0  0.0 - 0.4 10*9/L Final   ??? Absolute Basophils 04/07/2020 0.0  0.0 - 0.1 10*9/L Final   ??? Large Unstained Cells 04/07/2020 3  0 - 4 % Final   ??? Microcytosis 04/07/2020 Moderate* Not Present Final   ??? Macrocytosis 04/07/2020 Moderate* Not Present Final   ??? Anisocytosis 04/07/2020 Marked* Not Present Final   ??? Hypochromasia 04/07/2020 Marked* Not Present Final   ??? Platelet 04/07/2020 44* 150 - 440 10*9/L Final   ??? Smear Review Comments 04/07/2020 See Comment* Undefined Final    Slide reviewed  Blasts Present.     ??? Hypersegmented Neutrophils 04/07/2020 Present* Not Present Final   ??? Unit Blood Type 04/08/2020 A Pos   Final   ??? ISBT Number 04/08/2020 6200   Final   ??? Unit # 04/08/2020 [id number]   Final   ??? Status 04/08/2020 Transfused   Final   ??? Product ID 04/08/2020 Platelets   Final   ??? PRODUCT CODE 04/08/2020 [id number]   Final   ??? Unit Blood Type 04/08/2020 A Pos   Final   ??? ISBT Number 04/08/2020 6200   Final   ??? Unit # 04/08/2020 [id number]   Final   ??? Status 04/08/2020 Transfused   Final   ??? Product ID 04/08/2020 Platelets   Final   ??? PRODUCT CODE 04/08/2020 [id number]   Final   Hospital Outpatient Visit on 04/06/2020   Component Date Value Ref Range Status   ??? Unit Blood Type 04/07/2020 O Pos   Final   ??? ISBT Number 04/07/2020 5100   Final   ??? Unit # 04/07/2020 [id number]   Final   ??? Status 04/07/2020 Transfused   Final   ??? Product ID 04/07/2020 Platelets   Final   ??? PRODUCT CODE 04/07/2020 [id number]   Final   Lab on 04/06/2020   Component Date Value Ref Range Status   ??? Sodium 04/06/2020 131* 135 - 145 mmol/L Final   ??? Potassium 04/06/2020 4.8* 3.4 - 4.5 mmol/L Final   ??? Chloride 04/06/2020 104  98 - 107 mmol/L Final   ??? Anion Gap 04/06/2020 7  5 - 14 mmol/L Final   ??? CO2 04/06/2020 20.0  20.0 - 31.0 mmol/L Final   ??? BUN 04/06/2020 47* 9 - 23 mg/dL Final   ??? Creatinine 04/06/2020 1.46* 0.60 - 1.10 mg/dL Final   ???  BUN/Creatinine Ratio 04/06/2020 32   Final   ??? EGFR CKD-EPI Non-African American,* 04/06/2020 47* >=60 mL/min/1.10m2 Final   ??? EGFR CKD-EPI African American, Male 04/06/2020 54* >=60 mL/min/1.13m2 Final   ??? Glucose 04/06/2020 95  70 - 179 mg/dL Final   ??? Calcium 16/06/9603 8.9  8.7 - 10.4 mg/dL Final   ??? Albumin 54/05/8118 2.9* 3.4 - 5.0 g/dL Final   ??? Total Protein 04/06/2020 8.1  5.7 - 8.2 g/dL Final   ??? Total Bilirubin 04/06/2020 0.8  0.3 - 1.2 mg/dL Final   ??? AST 14/78/2956 31  <=34 U/L Final   ??? ALT 04/06/2020 11  10 - 49 U/L Final   ??? Alkaline Phosphatase 04/06/2020 78  46 - 116 U/L Final   ??? WBC 04/06/2020 6.8  4.5 - 11.0 10*9/L Final   ??? RBC 04/06/2020 3.76* 4.50 - 5.90 10*12/L Final   ??? HGB 04/06/2020 10.4* 13.5 - 17.5 g/dL Final   ??? HCT 21/30/8657 33.8* 41.0 - 53.0 % Final   ??? MCV 04/06/2020 90.1  80.0 - 100.0 fL Final   ??? MCH 04/06/2020 27.8  26.0 - 34.0 pg Final   ??? MCHC 04/06/2020 30.9* 31.0 - 37.0 g/dL Final   ??? RDW 84/69/6295 23.4* 12.0 - 15.0 % Final   ??? MPV 04/06/2020 7.4  7.0 - 10.0 fL Final   ??? Platelet 04/06/2020 28* 150 - 440 10*9/L Final   ??? Variable HGB Concentration 04/06/2020 Slight* Not Present Final   ??? Neutrophils % 04/06/2020 80.1  % Final   ??? Lymphocytes % 04/06/2020 14.3  % Final   ??? Monocytes % 04/06/2020 2.1  % Final   ??? Eosinophils % 04/06/2020 0.1  % Final   ??? Basophils % 04/06/2020 0.4  % Final   ??? Neutrophil Left Shift 04/06/2020 3+* Not Present Final   ??? Absolute Neutrophils 04/06/2020 5.4  2.0 - 7.5 10*9/L Final   ??? Absolute Lymphocytes 04/06/2020 1.0* 1.5 - 5.0 10*9/L Final   ??? Absolute Monocytes 04/06/2020 0.2  0.2 - 0.8 10*9/L Final   ??? Absolute Eosinophils 04/06/2020 0.0  0.0 - 0.4 10*9/L Final   ??? Absolute Basophils 04/06/2020 0.0  0.0 - 0.1 10*9/L Final   ??? Large Unstained Cells 04/06/2020 3  0 - 4 % Corrected    Blasts Present.    ??? Microcytosis 04/06/2020 Slight* Not Present Final   ??? Macrocytosis 04/06/2020 Moderate* Not Present Final   ??? Anisocytosis 04/06/2020 Marked* Not Present Final   ??? Hypochromasia 04/06/2020 Marked* Not Present Final   ??? Smear Review Comments 04/06/2020 See Comment* Undefined Final    Blasts Present.     ??? Hypersegmented Neutrophils 04/06/2020 Present* Not Present Final   Hospital Outpatient Visit on 04/05/2020   Component Date Value Ref Range Status   ??? WBC 04/05/2020 6.2  4.5 - 11.0 10*9/L Final   ??? RBC 04/05/2020 3.95* 4.50 - 5.90 10*12/L Final   ??? HGB 04/05/2020 10.9* 13.5 - 17.5 g/dL Final   ??? HCT 28/41/3244 36.4* 41.0 - 53.0 % Final   ??? MCV 04/05/2020 92.1  80.0 - 100.0 fL Final   ??? MCH 04/05/2020 27.7 26.0 - 34.0 pg Final   ??? MCHC 04/05/2020 30.1* 31.0 - 37.0 g/dL Final   ??? RDW 09/24/7251 23.3* 12.0 - 15.0 % Final   ??? MPV 04/05/2020 11.6* 7.0 - 10.0 fL Final   ??? Platelet 04/05/2020 34* 150 - 440 10*9/L Final   ??? ABO Grouping 04/05/2020 O POS   Final   ???  Antibody Screen 04/05/2020 NEG   Final   ??? Platelet 04/05/2020 40* 150 - 440 10*9/L Final   ??? Unit Blood Type 04/06/2020 A Pos   Final   ??? ISBT Number 04/06/2020 6200   Final   ??? Unit # 04/06/2020 [id number]   Final   ??? Status 04/06/2020 Transfused   Final   ??? Product ID 04/06/2020 Platelets   Final   ??? PRODUCT CODE 04/06/2020 [id number]   Final   No results displayed because visit has over 200 results.

## 2020-04-12 DIAGNOSIS — J300 Vasomotor rhinitis: Secondary | ICD-10-CM | POA: Diagnosis not present

## 2020-04-12 DIAGNOSIS — R040 Epistaxis: Secondary | ICD-10-CM | POA: Diagnosis not present

## 2020-04-12 NOTE — Unmapped (Signed)
Howard Villarreal, wife of patient contacted the Communication Center regarding the following:    - Requesting to speak with Yvonna Alanis regarding [name] is having bloody noses and a lot of drainage. We talked to her about it yesterday. We're going to the ENT today. I want to know what he can and cannot take.    Please contact [name] at [phone].    Thanks in advance,    Laverna Peace  [facility]   [phone]

## 2020-04-12 NOTE — Unmapped (Signed)
Hospital Outpatient Visit on 04/11/2020   Component Date Value Ref Range Status   ??? Unit Blood Type 04/11/2020 A Pos   Final   ??? ISBT Number 04/11/2020 6200   Final   ??? Unit # 04/11/2020 [id number]   Final   ??? Status 04/11/2020 Issued   Final   ??? Spec Expiration 04/11/2020 [id number]   Final   ??? Product ID 04/11/2020 Platelets   Final   ??? PRODUCT CODE 04/11/2020 [id number]   Final   ??? Unit Blood Type 04/11/2020 B Pos   Final   ??? ISBT Number 04/11/2020 7300   Final   ??? Unit # 04/11/2020 [id number]   Final   ??? Status 04/11/2020 Issued   Final   ??? Product ID 04/11/2020 Platelets   Final   ??? PRODUCT CODE 04/11/2020 [id number]   Final   Lab on 04/11/2020   Component Date Value Ref Range Status   ??? Sodium 04/11/2020 133* 135 - 145 mmol/L Final   ??? Potassium 04/11/2020 4.8* 3.4 - 4.5 mmol/L Final   ??? Chloride 04/11/2020 105  98 - 107 mmol/L Final   ??? Anion Gap 04/11/2020 5  5 - 14 mmol/L Final   ??? CO2 04/11/2020 23.0  20.0 - 31.0 mmol/L Final   ??? BUN 04/11/2020 51* 9 - 23 mg/dL Final   ??? Creatinine 04/11/2020 1.42* 0.60 - 1.10 mg/dL Final   ??? BUN/Creatinine Ratio 04/11/2020 36   Final   ??? EGFR CKD-EPI Non-African American,* 04/11/2020 49* >=60 mL/min/1.56m2 Final   ??? EGFR CKD-EPI African American, Male 04/11/2020 56* >=60 mL/min/1.31m2 Final   ??? Glucose 04/11/2020 92  70 - 179 mg/dL Final   ??? Calcium 03/47/4259 9.1  8.7 - 10.4 mg/dL Final   ??? Albumin 56/38/7564 3.0* 3.4 - 5.0 g/dL Final   ??? Total Protein 04/11/2020 8.4* 5.7 - 8.2 g/dL Final   ??? Total Bilirubin 04/11/2020 0.6  0.3 - 1.2 mg/dL Final   ??? AST 33/29/5188 29  <=34 U/L Final   ??? ALT 04/11/2020 16  10 - 49 U/L Final   ??? Alkaline Phosphatase 04/11/2020 82  46 - 116 U/L Final   ??? Phosphorus 04/11/2020 4.3  2.4 - 5.1 mg/dL Final   ??? LDH 41/66/0630 188  120 - 246 U/L Final   ??? Uric Acid 04/11/2020 4.6  3.7 - 9.2 mg/dL Final   ??? WBC 16/09/930 2.2* 4.5 - 11.0 10*9/L Final   ??? RBC 04/11/2020 3.06* 4.50 - 5.90 10*12/L Final   ??? HGB 04/11/2020 8.5* 13.5 - 17.5 g/dL Final   ??? HCT 35/57/3220 27.0* 41.0 - 53.0 % Final   ??? MCV 04/11/2020 88.2  80.0 - 100.0 fL Final   ??? MCH 04/11/2020 27.8  26.0 - 34.0 pg Final   ??? MCHC 04/11/2020 31.5  31.0 - 37.0 g/dL Final   ??? RDW 25/42/7062 22.7* 12.0 - 15.0 % Final   ??? MPV 04/11/2020 15.3* 7.0 - 10.0 fL Final   ??? Platelet 04/11/2020 26* 150 - 440 10*9/L Final   ??? Variable HGB Concentration 04/11/2020 Moderate* Not Present Final   ??? Neutrophil Left Shift 04/11/2020 1+* Not Present Final   ??? Microcytosis 04/11/2020 Slight* Not Present Final   ??? Macrocytosis 04/11/2020 Slight* Not Present Final   ??? Anisocytosis 04/11/2020 Marked* Not Present Final   ??? Hypochromasia 04/11/2020 Marked* Not Present Final   ??? Neutrophils % 04/11/2020 37  % Final   ??? Lymphocytes % 04/11/2020 51  % Final   ???  Monocytes % 04/11/2020 6  % Final   ??? Eosinophils % 04/11/2020 0  % Final   ??? Basophils % 04/11/2020 2  % Final   ??? Blasts % 04/11/2020 4* <=0 % Final   ??? Absolute Neutrophils 04/11/2020 0.8* 2.0 - 7.5 10*9/L Final   ??? Absolute Lymphocytes 04/11/2020 1.1* 1.5 - 5.0 10*9/L Final   ??? Absolute Monocytes 04/11/2020 0.1* 0.2 - 0.8 10*9/L Final   ??? Absolute Eosinophils 04/11/2020 0.0  0.0 - 0.4 10*9/L Final   ??? Absolute Basophils 04/11/2020 0.0  0.0 - 0.1 10*9/L Final   ??? Smear Review Comments 04/11/2020 See Comment* Undefined Final    Slide reviewed.  Blasts Present.

## 2020-04-12 NOTE — Unmapped (Signed)
I returned Howard Villarreal's phone call regarding ENT prescribed medications. She did not answer so I left a voicemail.    I explained that likely most of their interventions will be topical, and that is fine. He's already on TXA which is typically what can be used to improve mucosal bleeding, and if they prescribe something else that he needs to take as a pill they're welcome to let us know but it will likely be fine. (Venetoclax is a major substrate of 3A4, hence the attention to drug interactions.)      Manfred Arch, PharmD, BCOP, CPP  Pager: [phone]

## 2020-04-12 NOTE — Unmapped (Signed)
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 He has done ok on the medication with his biggest complaint being nosebleeds. Of note, he's historically struggled with nosebleeds and has seen ENT in the past, so now with low platelets these have become a problem, even bringing him to the ED. He is taking TXA with some benefit. He self-caths and is concerned about his kidney function as he has elevated SCr at baseline.     On labs today, his Hgb is 8.5, PLT 26, ANC 0.8. CMP notable for SCr 1.42 (similar to baseline - CrCl 44), phos now normal at 4.3.    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     04/01/2020 -  Chemotherapy    IP/OP AML AZACITIDINE + VENETOCLAX  azacitidine 75 mg/m2 SQ on days 1-7, venetoclax ramp up week 1; dose dependent, then azacitidine 75 mg/m2 SQ on days 1-7 every 28 days.         Weight and Vitals:  Wt Readings from Last 3 Encounters:   04/11/20 67.5 kg (148 lb 12.8 oz)   04/09/20 67 kg (147 lb 11.3 oz)   04/07/20 67.7 kg (149 lb 2.3 oz)     Temp Readings from Last 3 Encounters:   04/11/20 36.4 ??C (97.6 ??F) (Temporal)   04/11/20 36.5 ??C (97.7 ??F) (Oral)   04/09/20 36.7 ??C (98 ??F) (Oral) BP Readings from Last 3 Encounters:   04/11/20 117/59   04/11/20 130/68   04/09/20 132/63     Pulse Readings from Last 3 Encounters:   04/11/20 72   04/11/20 72   04/09/20 74       Pertinent Labs:  Hospital Outpatient Visit on 04/11/2020   Component Date Value Ref Range Status   ??? Unit Blood Type 04/11/2020 A Pos   Final   ??? ISBT Number 04/11/2020 6200   Final   ??? Unit # 04/11/2020 [id number]   Final   ??? Status 04/11/2020 Issued   Final   ??? Spec Expiration 04/11/2020 [id number]   Final   ??? Product ID 04/11/2020 Platelets   Final   ??? PRODUCT CODE 04/11/2020 [id number]   Final   Lab on 04/11/2020   Component Date Value Ref Range Status   ??? Sodium 04/11/2020 133* 135 - 145 mmol/L Final   ??? Potassium 04/11/2020 4.8* 3.4 - 4.5 mmol/L Final   ??? Chloride 04/11/2020 105  98 - 107 mmol/L Final   ??? Anion Gap 04/11/2020 5  5 - 14 mmol/L Final   ??? CO2 04/11/2020 23.0  20.0 - 31.0 mmol/L Final   ??? BUN 04/11/2020 51* 9 - 23 mg/dL Final   ???  Creatinine 04/11/2020 1.42* 0.60 - 1.10 mg/dL Final   ??? BUN/Creatinine Ratio 04/11/2020 36   Final   ??? EGFR CKD-EPI Non-African American,* 04/11/2020 49* >=60 mL/min/1.54m2 Final   ??? EGFR CKD-EPI African American, Male 04/11/2020 56* >=60 mL/min/1.17m2 Final   ??? Glucose 04/11/2020 92  70 - 179 mg/dL Final   ??? Calcium 16/06/9603 9.1  8.7 - 10.4 mg/dL Final   ??? Albumin 54/05/8118 3.0* 3.4 - 5.0 g/dL Final   ??? Total Protein 04/11/2020 8.4* 5.7 - 8.2 g/dL Final   ??? Total Bilirubin 04/11/2020 0.6  0.3 - 1.2 mg/dL Final   ??? AST 14/78/2956 29  <=34 U/L Final   ??? ALT 04/11/2020 16  10 - 49 U/L Final   ??? Alkaline Phosphatase 04/11/2020 82  46 - 116 U/L Final   ??? Phosphorus 04/11/2020 4.3  2.4 - 5.1 mg/dL Final   ??? LDH 21/30/8657 188  120 - 246 U/L Final   ??? Uric Acid 04/11/2020 4.6  3.7 - 9.2 mg/dL Final   ??? WBC 84/69/6295 2.2* 4.5 - 11.0 10*9/L Final   ??? RBC 04/11/2020 3.06* 4.50 - 5.90 10*12/L Final   ??? HGB 04/11/2020 8.5* 13.5 - 17.5 g/dL Final   ??? HCT 28/41/3244 27.0* 41.0 - 53.0 % Final   ??? MCV 04/11/2020 88.2  80.0 - 100.0 fL Final   ??? MCH 04/11/2020 27.8  26.0 - 34.0 pg Final   ??? MCHC 04/11/2020 31.5  31.0 - 37.0 g/dL Final   ??? RDW 09/24/7251 22.7* 12.0 - 15.0 % Final   ??? MPV 04/11/2020 15.3* 7.0 - 10.0 fL Final   ??? Platelet 04/11/2020 26* 150 - 440 10*9/L Final   ??? Variable HGB Concentration 04/11/2020 Moderate* Not Present Final   ??? Neutrophil Left Shift 04/11/2020 1+* Not Present Final   ??? Microcytosis 04/11/2020 Slight* Not Present Final   ??? Macrocytosis 04/11/2020 Slight* Not Present Final   ??? Anisocytosis 04/11/2020 Marked* Not Present Final   ??? Hypochromasia 04/11/2020 Marked* Not Present Final   ??? Neutrophils % 04/11/2020 37  % Final   ??? Lymphocytes % 04/11/2020 51  % Final   ??? Monocytes % 04/11/2020 6  % Final   ??? Eosinophils % 04/11/2020 0  % Final   ??? Basophils % 04/11/2020 2  % Final   ??? Blasts % 04/11/2020 4* <=0 % Final   ??? Absolute Neutrophils 04/11/2020 0.8* 2.0 - 7.5 10*9/L Final   ??? Absolute Lymphocytes 04/11/2020 1.1* 1.5 - 5.0 10*9/L Final   ??? Absolute Monocytes 04/11/2020 0.1* 0.2 - 0.8 10*9/L Final   ??? Absolute Eosinophils 04/11/2020 0.0  0.0 - 0.4 10*9/L Final   ??? Absolute Basophils 04/11/2020 0.0  0.0 - 0.1 10*9/L Final   ??? Smear Review Comments 04/11/2020 See Comment* Undefined Final    Slide reviewed.  Blasts Present.        Allergies: No Known Allergies    Drug Interactions:   - venetoclax will be dose reduced for addition of posaconazole when ANC < 0.5 and he has the latter from the pharmacy      Current Medications:  Current Outpatient Medications   Medication Sig Dispense Refill   ??? acetaminophen (TYLENOL) 500 MG tablet Take 500 mg by mouth every eight (8) hours as needed.     ??? allopurinoL (ZYLOPRIM) 300 MG tablet Take 150 mg by mouth daily.      ??? cyanocobalamin 1000 MCG tablet Take 1,000 mcg by mouth daily.      ???  finasteride (PROSCAR) 5 mg tablet Take 5 mg by mouth daily with evening meal.     ??? folic acid (FOLVITE) 1 MG tablet Take 1 mg by mouth daily.     ??? gentamicin (GARAMYCIN) 0.1 % ointment SMARTSIG:1 Topical Every Night     ??? latanoprost (XALATAN) 0.005 % ophthalmic solution 1 drop nightly.     ??? sodium bicarbonate 650 mg tablet Take 650 mg by mouth Two (2) times a day.     ??? timolol (TIMOPTIC) 0.5 % ophthalmic solution Apply 1 drop to eye daily.      ??? tranexamic acid 650 mg Tab tablet Take 2 tablets (1,300 mg total) by mouth Three (3) times a day for 7 days. 42 tablet 2   ??? valACYclovir (VALTREX) 500 MG tablet Take 1 tablet (500 mg total) by mouth daily. 30 tablet 11   ??? venetoclax (VENCLEXTA) 100 mg tablet Take 4 tablets (400 mg total) by mouth daily. Take with a meal and water. Do not chew, crush, or break tablets. 120 tablet 1   ??? posaconazole (NOXAFIL) 100 mg TbEC delayed released tablet Take 3 tablets (300 mg) by mouth daily. (Patient not taking: Reported on 04/11/2020) 90 tablet 1     No current facility-administered medications for this visit.       Adherence: No missed doses        Assessment: HowardVillarreal is a 74 y.o. male with AML being treated currently with aza/ven C1D11    Plan:   - Needs platelet transfusion today (continue to transfuse to keep > 50). Can continue TXA (sent refills). Ok to work with ENT if they have any suggestions to minimize nosebleeds  - Continue venetoclax 400 mg once daily with food  - For ppx, continue valtrex. When ANC < 0.5 can start levofloxacin (250 mg daily for renal fx). If has posaconazole and ANC <0.5, can start this at 300 mg daily and dose reduce venetoclax to just 100 mg daily  - Can STOP sevelamer (he's out and his phos is normal).   - Continue allopurinol 150 mg daily (renal fx) and oral hydration   - Working on schedule to arrange twice weekly labs/transfusion. I will see him next week along with NP. BMbx and MD follow-up arranged/requested.     F/u:  Future Appointments   Date Time Provider Department Center   04/13/2020 11:15 AM ADULT ONC LAB [facility]   04/13/2020 12:00 PM ONCINF CHAIR 23 [facility]   04/18/2020  9:30 AM ADULT ONC LAB [facility]   04/18/2020 10:30 AM Adine Madura [name], AGNP [facility]   04/18/2020 11:00 AM Malva Cogan, CPP [facility]   04/18/2020 12:00 PM ONCINF CHAIR 24 [facility]   04/20/2020 11:30 AM ADULT ONC LAB [facility]   04/20/2020 12:30 PM ONCINF CHAIR 15 [facility]   04/23/2020 11:15 AM ADULT ONC LAB [facility]   04/23/2020  1:00 PM Adine Madura Sawchak, AGNP [facility]   04/23/2020  2:00 PM ONCINF CHAIR 46 [facility]   04/27/2020  1:00 PM ADULT ONC LAB [facility]   04/27/2020  2:00 PM Scherrie Gerlach Sadiq, FNP [facility]   04/27/2020  3:00 PM ONCINF CHAIR 28 [facility]       I spent 40 minutes with HowardVillarreal in direct patient care.      Manfred Arch, PharmD, BCOP, CPP  Pager: [phone]

## 2020-04-12 NOTE — Unmapped (Signed)
Assumed care of pt at 1830.  An additional unit of platelets administered to pt, completed at 1900, pt tolerated well.  Pt and wife opt out to stay for repeat labs for second unit.  Pts spouse is driver and not comfortable driving in the dark.

## 2020-04-12 NOTE — Unmapped (Signed)
PT in clinic for platelet infusion, report given to [facility] RN and PT moved to bed 2.

## 2020-04-13 ENCOUNTER — Ambulatory Visit: Admit: 2020-04-13 | Discharge: 2020-04-14 | Payer: MEDICARE

## 2020-04-13 ENCOUNTER — Other Ambulatory Visit: Admit: 2020-04-13 | Discharge: 2020-04-14 | Payer: MEDICARE

## 2020-04-13 ENCOUNTER — Telehealth: Payer: Self-pay | Admitting: Hematology and Oncology

## 2020-04-13 DIAGNOSIS — D471 Chronic myeloproliferative disease: Secondary | ICD-10-CM | POA: Diagnosis not present

## 2020-04-13 DIAGNOSIS — C9200 Acute myeloblastic leukemia, not having achieved remission: Secondary | ICD-10-CM | POA: Diagnosis not present

## 2020-04-13 LAB — PLATELET COUNT
PLATELET COUNT: 26 10*9/L — ABNORMAL LOW (ref 150–440)
Platelets:NCnc:Pt:Bld:Qn:Automated count: 26 — ABNORMAL LOW
Platelets:NCnc:Pt:Bld:Qn:Automated count: 47 — ABNORMAL LOW

## 2020-04-13 LAB — MEAN CORPUSCULAR HEMOGLOBIN CONC: Erythrocyte mean corpuscular hemoglobin concentration:MCnc:Pt:RBC:Qn:Automated count: 31.4

## 2020-04-13 LAB — CBC W/ AUTO DIFF
HEMATOCRIT: 24.8 % — ABNORMAL LOW (ref 41.0–53.0)
HEMOGLOBIN: 7.8 g/dL — ABNORMAL LOW (ref 13.5–17.5)
MEAN CORPUSCULAR HEMOGLOBIN: 27.7 pg (ref 26.0–34.0)
MEAN PLATELET VOLUME: 8.8 fL (ref 7.0–10.0)
PLATELET COUNT: 14 10*9/L — ABNORMAL LOW (ref 150–440)
RED BLOOD CELL COUNT: 2.82 10*12/L — ABNORMAL LOW (ref 4.50–5.90)
WBC ADJUSTED: 1.7 10*9/L — ABNORMAL LOW (ref 4.5–11.0)

## 2020-04-13 LAB — SMEAR REVIEW

## 2020-04-13 NOTE — Unmapped (Unsigned)
Patient tolerated placement of PIV well. NAD

## 2020-04-13 NOTE — Unmapped (Addendum)
Lab on 04/13/2020   Component Date Value Ref Range Status   ??? ABO Grouping 04/13/2020 O POS   Final   ??? Antibody Screen 04/13/2020 NEG   Final   ??? WBC 04/13/2020 1.7* 4.5 - 11.0 10*9/L Final   ??? RBC 04/13/2020 2.82* 4.50 - 5.90 10*12/L Final   ??? HGB 04/13/2020 7.8* 13.5 - 17.5 g/dL Final   ??? HCT 40/06/2724 24.8* 41.0 - 53.0 % Final   ??? MCV 04/13/2020 88.0  80.0 - 100.0 fL Final   ??? MCH 04/13/2020 27.7  26.0 - 34.0 pg Final   ??? MCHC 04/13/2020 31.4  31.0 - 37.0 g/dL Final   ??? RDW 36/64/4034 22.7* 12.0 - 15.0 % Final   ??? MPV 04/13/2020 8.8  7.0 - 10.0 fL Final   ??? Platelet 04/13/2020 14* 150 - 440 10*9/L Final   ??? Variable HGB Concentration 04/13/2020 Slight* Not Present Final   ??? Microcytosis 04/13/2020 Slight* Not Present Final   ??? Macrocytosis 04/13/2020 Slight* Not Present Final   ??? Anisocytosis 04/13/2020 Marked* Not Present Final   ??? Hypochromasia 04/13/2020 Marked* Not Present Final   ??? Smear Review Comments 04/13/2020 See Comment* Undefined Final    Slide Reviewed.  Blasts Present.     Hospital Outpatient Visit on 04/13/2020   Component Date Value Ref Range Status   ??? Unit Blood Type 04/13/2020 AB Pos   Final   ??? ISBT Number 04/13/2020 8400   Final   ??? Unit # 04/13/2020 [id number]   Final   ??? Status 04/13/2020 Issued   Final   ??? Spec Expiration 04/13/2020 [id number]   Final   ??? Product ID 04/13/2020 Platelets   Final   ??? PRODUCT CODE 04/13/2020 [id number]   Final   ??? Crossmatch 04/13/2020 Compatible   Final   ??? Unit Blood Type 04/13/2020 O Pos   Final   ??? ISBT Number 04/13/2020 5100   Final   ??? Unit # 04/13/2020 [id number]   Final   ??? Status 04/13/2020 Ready   Final   ??? Spec Expiration 04/13/2020 [id number]   Final   ??? Product ID 04/13/2020 Red Blood Cells   Final   ??? PRODUCT CODE 04/13/2020 [id number]   Final   ??? Crossmatch 04/13/2020 Compatible   Final   ??? Unit Blood Type 04/13/2020 O Pos   Final   ??? ISBT Number 04/13/2020 5100   Final   ??? Unit # 04/13/2020 [id number]   Final   ??? Status 04/13/2020 Ready   Final   ??? Spec Expiration 04/13/2020 [id number]   Final   ??? Product ID 04/13/2020 Red Blood Cells   Final   ??? PRODUCT CODE 04/13/2020 [id number]   Final     Patient Education        Learning About Blood Transfusions  What is a blood transfusion?     Blood transfusion is a medical treatment to replace the blood or parts of blood that your body has lost. The blood goes through a tube from a bag to an intravenous (IV) catheter and into your vein.  You may need a blood transfusion after losing blood from an injury, a major surgery, an illness that causes bleeding, or an illness that destroys blood cells.  Transfusions are also used to give you the parts of blood???such as platelets, plasma, or substances that cause clotting???that your body needs to fight an illness or stop bleeding.  How is a blood transfusion done?  Before you receive a blood transfusion,  your blood is tested to find out what your blood type is. Blood or blood parts that are a match with your blood type are ordered by your doctor. Blood is typed as A, B, AB, or O. It is also typed as Rh-positive or Rh-negative.  Your blood is also screened to look for antibodies that might react with the blood that is given to you. The blood you are getting is checked and rechecked to make sure that it's the right type for you.  A sample of your blood is mixed with a sample of the blood you will receive to check for problems. Before actually giving you the transfusion, a doctor and nurses will look at the label on the package of blood and compare it to your hospital ID bracelet and medical records. The transfusion begins only when all agree that this is the correct blood and that you are the correct person to receive it.  To receive the transfusion, you will have an intravenous (IV) catheter inserted into a vein. A tube connects the catheter to the bag containing the blood, which is placed higher than your body. The blood then flows slowly into your vein. A doctor or nurse will check you several times during the transfusion to watch for a reaction or other problems.  What are the possible risks?  Blood transfusions have many benefits and are often life-saving. But they also have a few risks. Possible risks include:  ?? Your body's reaction to receiving new blood. This may include:  ? Fever.  ? Breathing problems.  ? Allergic reaction, such as hives, swelling, or a new rash.  ?? An infection from the blood. This risk is small because of the strict rules placed on handling and storing blood. Getting a viral infection, such as HIV or hepatitis B or C, through blood transfusions has become very rare. The U.S. Food and Drug Administration (FDA) enforces strict guidelines on the collection, testing, storage, and use of blood.  ?? Getting the wrong blood type by accident. Severe reactions, which can be life-threatening, are very rare.  ?? An infection at the transfusion site, such as redness, swelling, pain, bleeding, or pus.  How can you care for yourself at home?  To prevent infection at the transfusion site  ?? Wash the area daily with warm, soapy water, and pat it dry. Don't use hydrogen peroxide or alcohol, which can slow healing. You may cover the area with a gauze bandage if it weeps or rubs against clothing. Change the bandage every day.  ?? Keep the area clean and dry.  When should you call for help?  Call 911 anytime you think you may need emergency care. For example, call if:  ?? You have severe trouble breathing.  Call your doctor now or seek immediate medical care if:  ?? You have signs of an allergic reaction, such as hives, swelling, or a new rash.  ?? You have a fever.  ?? You feel weaker or more tired than usual.  ?? You have a yellow tint to your skin or the whites of your eyes.  ?? You have signs of an infection at the transfusion site, such as redness, swelling, pain, bleeding, or pus.  Watch closely for changes in your health, and be sure to contact your doctor if you have any problems.  Follow-up care is a key part of your treatment and safety. Be sure to make and go to all appointments, and call your doctor if you  are having problems. It's also a good idea to know your test results and keep a list of the medicines you take.  Where can you learn more?  Go to [employer] at https://myuncchart.org  Select Patient Education under [employer]. Enter V588 in the search box to learn more about Learning About Blood Transfusions.  Current as of: June 15, 2019??????????????????????????????Content Version: 12.9  ?? 2006-2021 [employer].   Care instructions adapted under license by [facility]. If you have questions about a medical condition or this instruction, always ask your healthcare professional. [employer] disclaims any warranty or liability for your use of this information.

## 2020-04-13 NOTE — Unmapped (Signed)
Patient arrived to chair 2 for transfusion.  No complaints noted.  Access of PIV intact with blood return. Blood product consent done 04/03/20. Patient observed for first five minutes of infusion, VSS.  Post-transfusion platelet count drawn and sent. Patient did not have an adequate bump in platelets, second bag needed.Second unit of platelets given, labs drawn and sent. Patient completed and tolerated treatment.  Access checked for blood return, flushed, and removed. AVS printed, patient discharged to home. NAD

## 2020-04-13 NOTE — Telephone Encounter (Signed)
04/13/2020 Returned call to pt's wife and left a message. Informed her that Dr. Loletha Grayer was made aware that they would like a short consultation with her about possibly returning the of Ruben [name]'s care back here. Informed her that Dr. Loletha Grayer is currently working with Dr. Evelene Croon at [facility] to see how feasible this would be. Encouraged her to call if she had any more questions or concerns, and that our office would be in touch with them once a decision has been made. SRW

## 2020-04-13 NOTE — Telephone Encounter (Signed)
  I am waiting to hear back from Dr Evelene Croon.  M

## 2020-04-16 ENCOUNTER — Other Ambulatory Visit: Admit: 2020-04-16 | Discharge: 2020-04-17 | Payer: MEDICARE

## 2020-04-16 ENCOUNTER — Ambulatory Visit: Admit: 2020-04-16 | Discharge: 2020-04-17 | Payer: MEDICARE

## 2020-04-16 DIAGNOSIS — D471 Chronic myeloproliferative disease: Secondary | ICD-10-CM | POA: Diagnosis not present

## 2020-04-16 DIAGNOSIS — C9200 Acute myeloblastic leukemia, not having achieved remission: Secondary | ICD-10-CM | POA: Diagnosis not present

## 2020-04-16 LAB — CBC W/ AUTO DIFF
BASOPHILS ABSOLUTE COUNT: 0 10*9/L (ref 0.0–0.1)
BASOPHILS ABSOLUTE COUNT: 0 10*9/L (ref 0.0–0.1)
BASOPHILS RELATIVE PERCENT: 0.1 %
BASOPHILS RELATIVE PERCENT: 0.4 %
EOSINOPHILS ABSOLUTE COUNT: 0 10*9/L (ref 0.0–0.4)
EOSINOPHILS ABSOLUTE COUNT: 0 10*9/L (ref 0.0–0.4)
EOSINOPHILS RELATIVE PERCENT: 0.1 %
EOSINOPHILS RELATIVE PERCENT: 0.1 %
HEMATOCRIT: 24.8 % — ABNORMAL LOW (ref 41.0–53.0)
HEMATOCRIT: 26 % — ABNORMAL LOW (ref 41.0–53.0)
HEMOGLOBIN: 7.8 g/dL — ABNORMAL LOW (ref 13.5–17.5)
LARGE UNSTAINED CELLS: 7 % — ABNORMAL HIGH (ref 0–4)
LARGE UNSTAINED CELLS: 11 % — ABNORMAL HIGH (ref 0–4)
LYMPHOCYTES ABSOLUTE COUNT: 1.1 10*9/L — ABNORMAL LOW (ref 1.5–5.0)
LYMPHOCYTES ABSOLUTE COUNT: 1.1 10*9/L — ABNORMAL LOW (ref 1.5–5.0)
LYMPHOCYTES RELATIVE PERCENT: 65.4 %
LYMPHOCYTES RELATIVE PERCENT: 61.4 %
MEAN CORPUSCULAR HEMOGLOBIN CONC: 31.4 g/dL (ref 31.0–37.0)
MEAN CORPUSCULAR HEMOGLOBIN CONC: 33 g/dL (ref 31.0–37.0)
MEAN CORPUSCULAR HEMOGLOBIN: 27.7 pg (ref 26.0–34.0)
MEAN CORPUSCULAR HEMOGLOBIN: 29.2 pg (ref 26.0–34.0)
MEAN CORPUSCULAR VOLUME: 88 fL (ref 80.0–100.0)
MEAN CORPUSCULAR VOLUME: 88.6 fL (ref 80.0–100.0)
MEAN PLATELET VOLUME: 8.8 fL (ref 7.0–10.0)
MEAN PLATELET VOLUME: 13.4 fL — ABNORMAL HIGH (ref 7.0–10.0)
MONOCYTES ABSOLUTE COUNT: 0.1 10*9/L — ABNORMAL LOW (ref 0.2–0.8)
MONOCYTES ABSOLUTE COUNT: 0.1 10*9/L — ABNORMAL LOW (ref 0.2–0.8)
MONOCYTES RELATIVE PERCENT: 5.6 %
MONOCYTES RELATIVE PERCENT: 5.2 %
NEUTROPHILS ABSOLUTE COUNT: 0.4 10*9/L — CL (ref 2.0–7.5)
NEUTROPHILS ABSOLUTE COUNT: 0.4 10*9/L — CL (ref 2.0–7.5)
NEUTROPHILS RELATIVE PERCENT: 21.7 %
NEUTROPHILS RELATIVE PERCENT: 21.6 %
NUCLEATED RED BLOOD CELLS: 2 /100{WBCs} (ref <=4)
PLATELET COUNT: 14 10*9/L — ABNORMAL LOW (ref 150–440)
PLATELET COUNT: 26 10*9/L — ABNORMAL LOW (ref 150–440)
RED BLOOD CELL COUNT: 2.82 10*12/L — ABNORMAL LOW (ref 4.50–5.90)
RED BLOOD CELL COUNT: 2.94 10*12/L — ABNORMAL LOW (ref 4.50–5.90)
RED CELL DISTRIBUTION WIDTH: 22.7 % — ABNORMAL HIGH (ref 12.0–15.0)
RED CELL DISTRIBUTION WIDTH: 20.9 % — ABNORMAL HIGH (ref 12.0–15.0)
WBC ADJUSTED: 1.7 10*9/L — ABNORMAL LOW (ref 4.5–11.0)
WBC ADJUSTED: 1.7 10*9/L — ABNORMAL LOW (ref 4.5–11.0)

## 2020-04-16 LAB — SMEAR REVIEW

## 2020-04-16 LAB — LYMPHOCYTES ABSOLUTE COUNT: Lymphocytes:NCnc:Pt:Bld:Qn:Automated count: 1.1 — ABNORMAL LOW

## 2020-04-16 LAB — PLATELET COUNT: Platelets:NCnc:Pt:Bld:Qn:Automated count: 39 — ABNORMAL LOW

## 2020-04-16 NOTE — Unmapped (Signed)
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  Sw met with patient and his spouse Howard Villarreal in the Infusion clinic.      Financial/Insurance:   Patient has Medicare A and B and [employer] supplement.  Patient's total household income is >250% of the [employer], so he does not qualify for any financial assistance programs.      Psychosocial Issues/Coping Issues:   Patient shared that he experiences some slight depression at times, which includes low mood and the inability to do the things he used to enjoy doing.  He stated that he has to be very careful with activities (particularly outdoor activities), because he could have a nosebleed at any time.  He does not have a history of depression or anxiety and is not interested in taking any anti-depressant medications at this time.  Discussed things that patient can still do indoors that he enjoys doing and give him a purpose.  Sw explained about CCSP services; patient does not want to be referred for CCSP services at this time, but will keep this in mind for the future.  Patient shared that he is more concerned about Howard Villarreal and how she is coping.    Howard Villarreal shared that she feels the need to be hypervigilant about what medications/blood products the patient receives.  She wants to be with him at all times and make sure he receives the correct med/blood product.  She said she writes down everything that happens while they are here and believes this helps her, as she always tries to write down positive things that occur.  Discussed how writing down positive actions/feelings can be beneficial and Sw explained about the Three Good Things exercise that can help with symptoms of depression.      Howard Villarreal is also having to do chores/activities at home that the patient used to do.  Howard Villarreal shared that they are spending much of their time at the hospital right now, which doesn't leave her much time to get things done at home.  She is looking to hire someone to help with these chores once the patient's appointment schedule is not as hectic.      Sw explained that CCSP services can be available to Howard Villarreal also, and therapists can help with coping for her as well.  Howard Villarreal was not interested in speaking with a therapist at this time, but did agree to keep this service in mind.  Sw will check back in with her at a future appointment regarding this.  Sw provided active listening and supportive counseling to both Howard Villarreal and the patient.    Sw also encouraged Howard Villarreal to visit the [facility] as they have many printed resources and they also have the massage chairs in the back that caregivers can utilize.  Howard Villarreal said once the patient is underway with his transfusion, she may feel more comfortable in leaving him, and may visit the [facility].     Howard Villarreal shared that they have good support from their church family.  The patient and Howard Villarreal have been married 42 years and have two grown children; one lives in [place] and the other lives close by, but travels a lot for her job.      Follow-up planned:  Sw will continue to follow up with patient and spouse to provide support and any available resources.  Patient and Howard Villarreal have Sw's contact information  and will call with any questions/concerns or if needing additional support.    Howard Villarreal, MSW, LCSW

## 2020-04-16 NOTE — Unmapped (Signed)
0920: PIV inserted; labs drawn and sent for analysis.

## 2020-04-17 DIAGNOSIS — C9200 Acute myeloblastic leukemia, not having achieved remission: Principal | ICD-10-CM

## 2020-04-17 NOTE — Unmapped (Signed)
Patient arrived to infusion via wheelchair in stable condition accompanied by his wife. Patient tolerated 1 unit of platelets without difficulty. Post platelet count is 39. Patient discharged in stable condition via wheelchair with his wife.

## 2020-04-18 ENCOUNTER — Ambulatory Visit: Admit: 2020-04-18 | Discharge: 2020-04-19 | Payer: MEDICARE | Attending: Pharmacist | Primary: Pharmacist

## 2020-04-18 ENCOUNTER — Other Ambulatory Visit: Admit: 2020-04-18 | Discharge: 2020-04-19 | Payer: MEDICARE

## 2020-04-18 ENCOUNTER — Ambulatory Visit: Admit: 2020-04-18 | Discharge: 2020-04-19 | Payer: MEDICARE | Attending: Adult Health | Primary: Adult Health

## 2020-04-18 ENCOUNTER — Ambulatory Visit: Admit: 2020-04-18 | Discharge: 2020-04-19 | Payer: MEDICARE

## 2020-04-18 DIAGNOSIS — E611 Iron deficiency: Secondary | ICD-10-CM | POA: Diagnosis not present

## 2020-04-18 DIAGNOSIS — D7581 Myelofibrosis: Secondary | ICD-10-CM | POA: Diagnosis not present

## 2020-04-18 DIAGNOSIS — D45 Polycythemia vera: Secondary | ICD-10-CM | POA: Diagnosis not present

## 2020-04-18 DIAGNOSIS — K5010 Crohn's disease of large intestine without complications: Secondary | ICD-10-CM | POA: Diagnosis not present

## 2020-04-18 DIAGNOSIS — N183 Chronic kidney disease, stage 3 unspecified: Secondary | ICD-10-CM | POA: Diagnosis not present

## 2020-04-18 DIAGNOSIS — Z7289 Other problems related to lifestyle: Secondary | ICD-10-CM | POA: Diagnosis not present

## 2020-04-18 DIAGNOSIS — D630 Anemia in neoplastic disease: Secondary | ICD-10-CM | POA: Diagnosis not present

## 2020-04-18 DIAGNOSIS — Z79899 Other long term (current) drug therapy: Secondary | ICD-10-CM | POA: Diagnosis not present

## 2020-04-18 DIAGNOSIS — Z7982 Long term (current) use of aspirin: Secondary | ICD-10-CM | POA: Diagnosis not present

## 2020-04-18 DIAGNOSIS — Z87891 Personal history of nicotine dependence: Secondary | ICD-10-CM | POA: Diagnosis not present

## 2020-04-18 DIAGNOSIS — C9200 Acute myeloblastic leukemia, not having achieved remission: Secondary | ICD-10-CM | POA: Diagnosis not present

## 2020-04-18 DIAGNOSIS — D471 Chronic myeloproliferative disease: Principal | ICD-10-CM

## 2020-04-18 LAB — COMPREHENSIVE METABOLIC PANEL
ALBUMIN: 2.9 g/dL — ABNORMAL LOW (ref 3.4–5.0)
ALT (SGPT): 12 U/L (ref 10–49)
ANION GAP: 2 mmol/L — ABNORMAL LOW (ref 5–14)
AST (SGOT): 30 U/L (ref <=34)
BILIRUBIN TOTAL: 0.5 mg/dL (ref 0.3–1.2)
BLOOD UREA NITROGEN: 39 mg/dL — ABNORMAL HIGH (ref 9–23)
BUN / CREAT RATIO: 26
CALCIUM: 9.1 mg/dL (ref 8.7–10.4)
CHLORIDE: 105 mmol/L (ref 98–107)
CO2: 26 mmol/L (ref 20.0–31.0)
CREATININE: 1.49 mg/dL — ABNORMAL HIGH
EGFR CKD-EPI AA MALE: 53 mL/min/{1.73_m2} — ABNORMAL LOW (ref >=60)
EGFR CKD-EPI NON-AA MALE: 46 mL/min/{1.73_m2} — ABNORMAL LOW (ref >=60)
GLUCOSE RANDOM: 93 mg/dL (ref 70–179)
POTASSIUM: 4.6 mmol/L — ABNORMAL HIGH (ref 3.4–4.5)
PROTEIN TOTAL: 8.3 g/dL — ABNORMAL HIGH (ref 5.7–8.2)
SODIUM: 133 mmol/L — ABNORMAL LOW (ref 135–145)

## 2020-04-18 LAB — CBC W/ AUTO DIFF
BASOPHILS ABSOLUTE COUNT: 0 10*9/L (ref 0.0–0.1)
BASOPHILS RELATIVE PERCENT: 0.9 %
EOSINOPHILS ABSOLUTE COUNT: 0 10*9/L (ref 0.0–0.4)
EOSINOPHILS RELATIVE PERCENT: 0 %
HEMATOCRIT: 23.8 % — ABNORMAL LOW (ref 41.0–53.0)
HEMOGLOBIN: 7.7 g/dL — ABNORMAL LOW (ref 13.5–17.5)
LARGE UNSTAINED CELLS: 13 % — ABNORMAL HIGH (ref 0–4)
LYMPHOCYTES RELATIVE PERCENT: 56.5 %
MEAN CORPUSCULAR HEMOGLOBIN CONC: 32.4 g/dL (ref 31.0–37.0)
MEAN CORPUSCULAR HEMOGLOBIN: 28.7 pg (ref 26.0–34.0)
MEAN CORPUSCULAR VOLUME: 88.7 fL (ref 80.0–100.0)
MEAN PLATELET VOLUME: 10.9 fL — ABNORMAL HIGH (ref 7.0–10.0)
MONOCYTES ABSOLUTE COUNT: 0.1 10*9/L — ABNORMAL LOW (ref 0.2–0.8)
MONOCYTES RELATIVE PERCENT: 6.4 %
NEUTROPHILS ABSOLUTE COUNT: 0.3 10*9/L — CL (ref 2.0–7.5)
NEUTROPHILS RELATIVE PERCENT: 23.6 %
PLATELET COUNT: 93 10*9/L — ABNORMAL LOW (ref 150–440)
RED BLOOD CELL COUNT: 2.68 10*12/L — ABNORMAL LOW (ref 4.50–5.90)
RED CELL DISTRIBUTION WIDTH: 21 % — ABNORMAL HIGH (ref 12.0–15.0)
WBC ADJUSTED: 1.5 10*9/L — ABNORMAL LOW (ref 4.5–11.0)

## 2020-04-18 LAB — BUN / CREAT RATIO: Urea nitrogen/Creatinine:MRto:Pt:Ser/Plas:Qn:: 26

## 2020-04-18 LAB — MONOCYTES RELATIVE PERCENT: Monocytes/100 leukocytes:NFr:Pt:Bld:Qn:Automated count: 6.4

## 2020-04-18 LAB — SLIDE REVIEW

## 2020-04-18 LAB — PHOSPHORUS: Phosphate:MCnc:Pt:Ser/Plas:Qn:: 3.8

## 2020-04-18 LAB — LACTATE DEHYDROGENASE: Lactate dehydrogenase:CCnc:Pt:Ser/Plas:Qn:Reaction: pyruvate to lactate: 494 — ABNORMAL HIGH

## 2020-04-18 LAB — SMEAR REVIEW

## 2020-04-18 LAB — MAGNESIUM: Magnesium:MCnc:Pt:Ser/Plas:Qn:: 1.7

## 2020-04-18 MED ORDER — LEVOFLOXACIN 250 MG TABLET
ORAL_TABLET | Freq: Every day | ORAL | 1 refills | 30 days | Status: CP
Start: 2020-04-18 — End: 2021-04-18

## 2020-04-18 MED ORDER — LEVOFLOXACIN 500 MG TABLET
0 days
Start: 2020-04-18 — End: ?

## 2020-04-18 MED ORDER — VENETOCLAX 100 MG TABLET
ORAL_TABLET | Freq: Every day | ORAL | 1 refills | 30.00000 days | Status: CP
Start: 2020-04-18 — End: ?

## 2020-04-18 NOTE — Unmapped (Signed)
Malignant Hematology Established Visit      Patient: Howard Villarreal  MRN: [id number]  DOB: 06/21/1946  Date of Visit: 04/18/2020    Local Oncologist: Dr. Nelva Nay    Reason for Visit   OLUSEGUN GERSTENBERGER is a 74 y.o. 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  Seeing Dr. Fredderick Phenix as well.  Plan to initiate posa when it is available.  Will transfusion today for hgb 7.7.  Platelets are up to 93k and he has not had any bleeding.      Plan   1. Continue venetoclax 400mg  PO daily  2. Initiate posa when he receives it per Dr. Christoper Fabian directions  3. Transfuse today for hgb 7.7  4. BMBx 8/2    [name], RN, MSN, AGPCNP-C  Nurse Practitioner  Hematologic Malignancies  [facility]  [phone] (phone)  [phone] (fax)  [email or web address]    I personally spent 35 minutes face-to-face and non-face-to-face in the care of this patient, which includes all pre, intra, and post visit time on the date of service.    Interval History:  Doing fairly well.  Very fatigued today.  Had some chills the other day that lasted for a few hours.  No fever with this. Some mild nausea yesterday but did not need medication for it.  No bleeding since last visit.       Otherwise, denies new bone pain, fevers, chills, night sweats, lumps/bumps, tongue swelling, shortness of breath, syncope, lightheadedness, constipation or diarrhea, nausea or vomiting, very easy bruising or bleeding, or urinary changes.       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 He has had polycythemia since at least 2013 (no labs prior to that, but on 08/24/12, WBC 21, Hgb 19.3, Hct 59.8%, Plts 551). Hematocrit was 62.1 with a hemoglobin of 19.8 on 03/28/2015. 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 Erythropoietin level was 1.1 (low).     He began a phlebotomy program on 03/28/2015 when he was seen in consultation by Dr. Merlene Pulling and diagnosed with PV to maintain a hematocrit goal of < 45%; he was likely iron deficient at diagnosis, with MCV 77fL.   He last underwent phlebotomy on 05/02/2015. He is maintained on aspirin 81mg  po daily without significant bleeding nor dyspepsia.     He is followed closely by GI (Dr. Mechele Collin) for a history of polyps and Crohn's disease. Last colonoscopy was 2012. He has become iron deficient on his phlebotomy program (ferritin 21 on 11/28/2015).     CBC on 11/28/2015 revealed a hematocrit of 39.3, hemoglobin 11.9, platelets 463,000, WBC 32,000 with an ANC of 25,000. Differential included 71% segs, 4% lymphocytes and 17% monocytes. Peripheral smear revealed leukocytosis with predominantly mature neutrophils, increased monocytes and rare blasts (<1%).    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 Marrow was packed (95-100% cellularity) with pan myelosis, multi-lineage dyspoiesis, and no significant increase in blasts. There was moderate to focally marked reticulin fibrosis (grade 2-3/3). Storage iron was not identified. Flow cytometry revealed non-specific atypical myeloid findings with no increase in  blasts. 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 Cytogenetics were normal (46, XY).    He was briefly on hydroxyurea 500 mg a day (12/05/2015 - 12/19/2015). He began allopurinol on 12/19/2015. Uric acid has decreased from 12 to 5.8 to 5.1. Creatinine is stable at 1.62 (CrCl 42 ml/min). LDH was 466 on 01/02/2016. Hematocrit is drifting down (36.9 to 35.4). Platelets are 228,000 and WBC of 21,700.    [facility] review of the bmbx:  Morphologic evaluation, together with review of outside reports of flow cytometry, cytogenetics, and our review of outside special stains, support the above interpretation. Morphologic evaluation of the aspirate smear may be limited by suboptimal/weak staining. While the aspirate differential count does not reflect an increase in blasts, there are scattered immature appearing mononuclear cells that may represent hypogranular and/or maturing granulocytes. Megakaryocytes are dysplastic, showing numerous hypo-loabted and multi-nucleated forms as would be seen in myelodysplastic syndrome, with relatively infrequent hyper-lobated forms. Erythroid precursors show at least mild dysplasia. Granulocytic precursors may show hypogranularity. The bone marrow biopsy demonstrates a hypercellular marrow with increased reticulin fibrosis. Overall, while it is difficult to exclude a post-polycythemic myelofibrosis with progression to dysplasia given the patient's history, an overlap myeloproliferative/myedysplastic neoplasm would also be in the differential. In addition, flow cytometric, CD34 immunohistochemistry on the biopsy and marrow aspirate differential show no definitive increase in blasts, the possible suboptimal staining of the aspirate smear and/or true hypogranularity of the granulocytes may lead to an underestimate of these forms. Repeat marrow evaluation and correlation with myeloid mutation panel (MDS/MPN) is recommended.??    His bone marrow biopsy from 04/07/16 showed a >95% cellular marrow with 5% blasts by manual aspirate differential, mild reticulin fibrosis (but not enough to classify as MF), mild dyserythropoiesis, hypogranular neutrophils (but not enough to classify as MDS) and molecular mutations in: JAK2 V617F, IDH2, RUNX1, SRSF2.  Mutations in RUNX1 and SRSF2 are associated with poorer prognosis and shorter survival in MPN.  However, median overall survival with this is still reasonably long, estimated at 10 years assuming one still classifies this as PV, which technically it is  [[email or web address]].  I do think what we are seeing is PV evolving to MF (especially given the prior outside bmbx that showed moderated to marked fibrotic changes, but only minimal on our sampling plus the presence of nucleated RBCs in the periphery and elevated LDH.    Assuming this were MF, he would be classified as Intermediate-2 risk by DIPPS and the same by DIPPS Plus, noting that he does have a higher-risk mutational profile with RUNX1 and SRSF2 (though only ASXL1 remained significant in the follow-up analyses of molecular markers).      Review of Systems     Wt Readings from Last 12 Encounters:   04/16/20 68.6 kg (151 lb 3.2 oz)   04/11/20 67.5 kg (148 lb 12.8 oz)   04/09/20 67 kg (147 lb 11.3 oz)   04/07/20 67.7 kg (149 lb 2.3 oz)   04/05/20 71 kg (156 lb 9.6 oz)   04/03/20 73.1 kg (161 lb 1.6 oz)   03/28/20 72.1 kg (158 lb 15.2 oz)   03/12/20 74.8 kg (164 lb 12.8 oz)   03/06/20 76.2 kg (168 lb 1.6 oz)   07/14/19 76 kg (167 lb 8 oz)   07/14/19 75.9 kg (167 lb 5.3 oz)   07/11/19 75.8 kg (167 lb 3.2 oz)     + unintentional weight loss  Very fatigued - has  not excerised much at all.  Did make it one mile a few weeks ago.  Has had a pneumonia ~3 months with coughing.  Took azithromycin last week with improvement and now amoxicillin.    (-) fever  (-) night sweats  Intermittent appetite - not clear if is related to early satiety.   No abdominal pain.   Sometimes swollen feet.  Had improved with ruxolitinib but not fedratinib.   No orthopnea. But is not able to speak in full sentences.     Urinary catheter - has been present for a long time.  BPH.    No falls.    No mucocutaneous bleeding, but is bruising easily. Had nose bleeding cauterized and hasn't returned.         10 systems reviewed and negative except as noted in the HPI and Interval History.     Medications     Current Outpatient Medications   Medication Sig Dispense Refill   ??? acetaminophen (TYLENOL) 500 MG tablet Take 500 mg by mouth every eight (8) hours as needed.     ??? allopurinoL (ZYLOPRIM) 300 MG tablet Take 150 mg by mouth daily.      ??? cyanocobalamin 1000 MCG tablet Take 1,000 mcg by mouth daily.      ??? finasteride (PROSCAR) 5 mg tablet Take 5 mg by mouth daily with evening meal.     ??? folic acid (FOLVITE) 1 MG tablet Take 1 mg by mouth daily.     ??? gentamicin (GARAMYCIN) 0.1 % ointment SMARTSIG:1 Topical Every Night     ??? latanoprost (XALATAN) 0.005 % ophthalmic solution 1 drop nightly.     ??? posaconazole (NOXAFIL) 100 mg TbEC delayed released tablet Take 3 tablets (300 mg) by mouth daily. (Patient not taking: Reported on 04/11/2020) 90 tablet 1   ??? sodium bicarbonate 650 mg tablet Take 650 mg by mouth Two (2) times a day.     ??? timolol (TIMOPTIC) 0.5 % ophthalmic solution Apply 1 drop to eye daily.      ??? tranexamic acid 650 mg Tab tablet Take 2 tablets (1,300 mg total) by mouth Three (3) times a day for 7 days. 42 tablet 2   ??? valACYclovir (VALTREX) 500 MG tablet Take 1 tablet (500 mg total) by mouth daily. 30 tablet 11   ??? venetoclax (VENCLEXTA) 100 mg tablet Take 4 tablets (400 mg total) by mouth daily. Take with a meal and water. Do not chew, crush, or break tablets. 120 tablet 1     No current facility-administered medications for this visit.         Allergies   No Known Allergies    Past Medical and Surgical History     Past Medical History:   Diagnosis Date   ??? 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 CKD (chronic kidney disease) stage 3, GFR 30-59 ml/min    ??? Crohn's colitis (CMS-HCC) 1986    No surgeries. Was on 6-MP for awhile; hasn't been on anything 2005.   ??? Glaucoma    ??? 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      Past Surgical History:   Procedure Laterality Date   ??? MOHS SURGERY  2016    left ear for non-melanoma skin cancer       Social History     Social History Socioeconomic History   ??? Marital status: Married     Spouse name: Not on file   ???  Number of children: Not on file   ??? Years of education: Not on file   ??? Highest education level: Not on file   Occupational History   ??? Not on file   Tobacco Use   ??? Smoking status: Former Smoker   ??? Smokeless tobacco: Never Used   Vaping Use   ??? Vaping Use: Never used   Substance and Sexual Activity   ??? Alcohol use: Yes     Alcohol/week: 4.0 standard drinks     Types: 4 Glasses of wine per week     Comment: occasional    ??? Drug use: Not on file   ??? Sexual activity: Not on file   Other Topics Concern   ??? Not on file   Social History Narrative   ??? Not on file     Social Determinants of Health     Financial Resource Strain: Low Risk    ??? Difficulty of Paying Living Expenses: Not hard at all   Food Insecurity: No Food Insecurity   ??? Worried About Programme researcher, broadcasting/film/video in the Last Year: Never true   ??? Ran Out of Food in the Last Year: Never true   Transportation Needs: No Transportation Needs   ??? Lack of Transportation (Medical): No   ??? Lack of Transportation (Non-Medical): No   Physical Activity:    ??? Days of Exercise per Week:    ??? Minutes of Exercise per Session:    Stress:    ??? Feeling of Stress :    Social Connections:    ??? Frequency of Communication with Friends and Family:    ??? Frequency of Social Gatherings with Friends and Family:    ??? Attends Religious Services:    ??? Database administrator or Organizations:    ??? Attends Banker Meetings:    ??? Marital Status:    Former Audiological scientist; retired several years ago.  Lives with his wife.        Physical Examination     There were no vitals filed for this visit.  GENERAL:  Quite thin - weight was recorded at 168 here, but 153 at home.  His waist is 32. Accompanied by his wife.  In no distress.  Pleasant and alert.   RESP: Breathing comfortably.  Speaking comfortably in full sentences.   During the 60 minute visit, 2 episodes of ~3 seconds of non-productive cough. NEURO: A&Ox4.  CN II-XII grossly intact and symmetric.   PSYCH: Normal affect, mood is so so.  Forward thinking.  Goal-directed and linear.   EXT:  1+ pedal edema bilaterally.   SKIN:  Some bruising on arms, but no petechiae, no rashes.   .     Laboratory Testing and Imaging     Hospital Outpatient Visit on 04/16/2020   Component Date Value Ref Range Status   ??? Platelet 04/16/2020 39* 150 - 440 10*9/L Final   ??? Unit Blood Type 04/17/2020 A Pos   Final   ??? ISBT Number 04/17/2020 6200   Final   ??? Unit # 04/17/2020 [id number]   Final   ??? Status 04/17/2020 Transfused   Final   ??? Product ID 04/17/2020 Platelets   Final   ??? PRODUCT CODE 04/17/2020 [id number]   Final   Lab on 04/16/2020   Component Date Value Ref Range Status   ??? WBC 04/16/2020 1.7* 4.5 - 11.0 10*9/L Final   ??? RBC 04/16/2020 2.94* 4.50 - 5.90 10*12/L Final   ???  HGB 04/16/2020 8.6* 13.5 - 17.5 g/dL Final   ??? HCT 16/06/9603 26.0* 41.0 - 53.0 % Final   ??? MCV 04/16/2020 88.6  80.0 - 100.0 fL Final   ??? MCH 04/16/2020 29.2  26.0 - 34.0 pg Final   ??? MCHC 04/16/2020 33.0  31.0 - 37.0 g/dL Final   ??? RDW 54/05/8118 20.9* 12.0 - 15.0 % Final   ??? MPV 04/16/2020 13.4* 7.0 - 10.0 fL Final   ??? Platelet 04/16/2020 26* 150 - 440 10*9/L Final   ??? nRBC 04/16/2020 2  <=4 /100 WBCs Final   ??? Variable HGB Concentration 04/16/2020 Slight* Not Present Final   ??? Neutrophils % 04/16/2020 21.6  % Final   ??? Lymphocytes % 04/16/2020 61.4  % Final   ??? Monocytes % 04/16/2020 5.2  % Final   ??? Eosinophils % 04/16/2020 0.1  % Final   ??? Basophils % 04/16/2020 0.4  % Final   ??? Absolute Neutrophils 04/16/2020 0.4* 2.0 - 7.5 10*9/L Final   ??? Absolute Lymphocytes 04/16/2020 1.1* 1.5 - 5.0 10*9/L Final   ??? Absolute Monocytes 04/16/2020 0.1* 0.2 - 0.8 10*9/L Final   ??? Absolute Eosinophils 04/16/2020 0.0  0.0 - 0.4 10*9/L Final   ??? Absolute Basophils 04/16/2020 0.0  0.0 - 0.1 10*9/L Final   ??? Large Unstained Cells 04/16/2020 11* 0 - 4 % Final    Blasts present.    ??? Microcytosis 04/16/2020 Slight* Not Present Final   ??? Macrocytosis 04/16/2020 Slight* Not Present Final   ??? Anisocytosis 04/16/2020 Moderate* Not Present Final   ??? Hypochromasia 04/16/2020 Marked* Not Present Final   ??? Smear Review Comments 04/16/2020 See Comment* Undefined Final    Slide reviewed. Blasts present.    Hospital Outpatient Visit on 04/13/2020   Component Date Value Ref Range Status   ??? Platelet 04/13/2020 26* 150 - 440 10*9/L Final   ??? Unit Blood Type 04/14/2020 AB Pos   Final   ??? ISBT Number 04/14/2020 8400   Final   ??? Unit # 04/14/2020 [id number]   Final   ??? Status 04/14/2020 Transfused   Final   ??? Product ID 04/14/2020 Platelets   Final   ??? PRODUCT CODE 04/14/2020 [id number]   Final   ??? Crossmatch 04/14/2020 Compatible   Final   ??? Unit Blood Type 04/14/2020 O Pos   Final   ??? ISBT Number 04/14/2020 5100   Final   ??? Unit # 04/14/2020 [id number]   Final   ??? Status 04/14/2020 Transfused   Final   ??? Product ID 04/14/2020 Red Blood Cells   Final   ??? PRODUCT CODE 04/14/2020 [id number]   Final   ??? Crossmatch 04/14/2020 Compatible   Final   ??? Unit Blood Type 04/14/2020 O Pos   Final   ??? ISBT Number 04/14/2020 5100   Final   ??? Unit # 04/14/2020 [id number]   Final   ??? Status 04/14/2020 Transfused   Final   ??? Product ID 04/14/2020 Red Blood Cells   Final   ??? PRODUCT CODE 04/14/2020 [id number]   Final   ??? Unit Blood Type 04/14/2020 B Pos   Final   ??? ISBT Number 04/14/2020 7300   Final   ??? Unit # 04/14/2020 [id number]   Final   ??? Status 04/14/2020 Transfused   Final   ??? Product ID 04/14/2020 Platelets   Final   ??? PRODUCT CODE 04/14/2020 [id number]   Final   ??? Platelet 04/13/2020 47* 150 - 440 10*9/L Final  Lab on 04/13/2020   Component Date Value Ref Range Status   ??? ABO Grouping 04/13/2020 O POS   Final   ??? Antibody Screen 04/13/2020 NEG   Final   ??? WBC 04/13/2020 1.7* 4.5 - 11.0 10*9/L Final   ??? RBC 04/13/2020 2.82* 4.50 - 5.90 10*12/L Final   ??? HGB 04/13/2020 7.8* 13.5 - 17.5 g/dL Final   ??? HCT 45/40/9811 24.8* 41.0 - 53.0 % Final   ??? MCV 04/13/2020 88.0  80.0 - 100.0 fL Final   ??? MCH 04/13/2020 27.7  26.0 - 34.0 pg Final   ??? MCHC 04/13/2020 31.4  31.0 - 37.0 g/dL Final   ??? RDW 91/47/8295 22.7* 12.0 - 15.0 % Final   ??? MPV 04/13/2020 8.8  7.0 - 10.0 fL Final   ??? Platelet 04/13/2020 14* 150 - 440 10*9/L Final   ??? Variable HGB Concentration 04/13/2020 Slight* Not Present Final   ??? Neutrophils % 04/13/2020 21.7  % Final    This is an appended report.  These results have been appended to a previously final verified report.   ??? Lymphocytes % 04/13/2020 65.4  % Final    This is an appended report.  These results have been appended to a previously final verified report.   ??? Monocytes % 04/13/2020 5.6  % Final    This is an appended report.  These results have been appended to a previously final verified report.   ??? Eosinophils % 04/13/2020 0.1  % Final    This is an appended report.  These results have been appended to a previously final verified report.   ??? Basophils % 04/13/2020 0.1  % Final    This is an appended report.  These results have been appended to a previously final verified report.   ??? Absolute Neutrophils 04/13/2020 0.4* 2.0 - 7.5 10*9/L Final    This is an appended report.  These results have been appended to a previously final verified report.   ??? Absolute Lymphocytes 04/13/2020 1.1* 1.5 - 5.0 10*9/L Final    This is an appended report.  These results have been appended to a previously final verified report.   ??? Absolute Monocytes 04/13/2020 0.1* 0.2 - 0.8 10*9/L Final    This is an appended report.  These results have been appended to a previously final verified report.   ??? Absolute Eosinophils 04/13/2020 0.0  0.0 - 0.4 10*9/L Final    This is an appended report.  These results have been appended to a previously final verified report.   ??? Absolute Basophils 04/13/2020 0.0  0.0 - 0.1 10*9/L Final    This is an appended report.  These results have been appended to a previously final verified report.   ??? Large Unstained Cells 04/13/2020 7* 0 - 4 % Final    Comment: Slide Reviewed.  Blasts Present.                             This is an appended report.  These results have been appended to a previously final verified report.   ??? Microcytosis 04/13/2020 Slight* Not Present Final   ??? Macrocytosis 04/13/2020 Slight* Not Present Final   ??? Anisocytosis 04/13/2020 Marked* Not Present Final   ??? Hypochromasia 04/13/2020 Marked* Not Present Final   ??? Smear Review Comments 04/13/2020 See Comment* Undefined Final    Slide Reviewed.  Blasts Present.     Hospital Outpatient Visit on 04/11/2020  Component Date Value Ref Range Status   ??? Unit Blood Type 04/12/2020 A Pos   Final   ??? ISBT Number 04/12/2020 6200   Final   ??? Unit # 04/12/2020 [id number]   Final   ??? Status 04/12/2020 Transfused   Final   ??? Product ID 04/12/2020 Platelets   Final   ??? PRODUCT CODE 04/12/2020 [id number]   Final   ??? Unit Blood Type 04/12/2020 B Pos   Final   ??? ISBT Number 04/12/2020 7300   Final   ??? Unit # 04/12/2020 [id number]   Final   ??? Status 04/12/2020 Transfused   Final   ??? Product ID 04/12/2020 Platelets   Final   ??? PRODUCT CODE 04/12/2020 [id number]   Final   Lab on 04/11/2020   Component Date Value Ref Range Status   ??? Sodium 04/11/2020 133* 135 - 145 mmol/L Final   ??? Potassium 04/11/2020 4.8* 3.4 - 4.5 mmol/L Final   ??? Chloride 04/11/2020 105  98 - 107 mmol/L Final   ??? Anion Gap 04/11/2020 5  5 - 14 mmol/L Final   ??? CO2 04/11/2020 23.0  20.0 - 31.0 mmol/L Final   ??? BUN 04/11/2020 51* 9 - 23 mg/dL Final   ??? Creatinine 04/11/2020 1.42* 0.60 - 1.10 mg/dL Final   ??? BUN/Creatinine Ratio 04/11/2020 36   Final   ??? EGFR CKD-EPI Non-African American,* 04/11/2020 49* >=60 mL/min/1.31m2 Final   ??? EGFR CKD-EPI African American, Male 04/11/2020 56* >=60 mL/min/1.53m2 Final   ??? Glucose 04/11/2020 92  70 - 179 mg/dL Final   ??? Calcium 41/32/4401 9.1  8.7 - 10.4 mg/dL Final   ??? Albumin 02/72/5366 3.0* 3.4 - 5.0 g/dL Final   ??? Total Protein 04/11/2020 8.4* 5.7 - 8.2 g/dL Final   ??? Total Bilirubin 04/11/2020 0.6  0.3 - 1.2 mg/dL Final   ??? AST 44/11/4740 29  <=34 U/L Final   ??? ALT 04/11/2020 16  10 - 49 U/L Final   ??? Alkaline Phosphatase 04/11/2020 82  46 - 116 U/L Final   ??? Phosphorus 04/11/2020 4.3  2.4 - 5.1 mg/dL Final   ??? LDH 59/56/3875 188  120 - 246 U/L Final   ??? Uric Acid 04/11/2020 4.6  3.7 - 9.2 mg/dL Final   ??? WBC 64/33/2951 2.2* 4.5 - 11.0 10*9/L Final   ??? RBC 04/11/2020 3.06* 4.50 - 5.90 10*12/L Final   ??? HGB 04/11/2020 8.5* 13.5 - 17.5 g/dL Final   ??? HCT 88/41/6606 27.0* 41.0 - 53.0 % Final   ??? MCV 04/11/2020 88.2  80.0 - 100.0 fL Final   ??? MCH 04/11/2020 27.8  26.0 - 34.0 pg Final   ??? MCHC 04/11/2020 31.5  31.0 - 37.0 g/dL Final   ??? RDW 30/16/0109 22.7* 12.0 - 15.0 % Final   ??? MPV 04/11/2020 15.3* 7.0 - 10.0 fL Final   ??? Platelet 04/11/2020 26* 150 - 440 10*9/L Final   ??? Variable HGB Concentration 04/11/2020 Moderate* Not Present Final   ??? Neutrophil Left Shift 04/11/2020 1+* Not Present Final   ??? Microcytosis 04/11/2020 Slight* Not Present Final   ??? Macrocytosis 04/11/2020 Slight* Not Present Final   ??? Anisocytosis 04/11/2020 Marked* Not Present Final   ??? Hypochromasia 04/11/2020 Marked* Not Present Final   ??? Neutrophils % 04/11/2020 37  % Final   ??? Lymphocytes % 04/11/2020 51  % Final   ??? Monocytes % 04/11/2020 6  % Final   ??? Eosinophils % 04/11/2020 0  % Final   ???  Basophils % 04/11/2020 2  % Final   ??? Blasts % 04/11/2020 4* <=0 % Final   ??? Absolute Neutrophils 04/11/2020 0.8* 2.0 - 7.5 10*9/L Final   ??? Absolute Lymphocytes 04/11/2020 1.1* 1.5 - 5.0 10*9/L Final   ??? Absolute Monocytes 04/11/2020 0.1* 0.2 - 0.8 10*9/L Final   ??? Absolute Eosinophils 04/11/2020 0.0  0.0 - 0.4 10*9/L Final   ??? Absolute Basophils 04/11/2020 0.0  0.0 - 0.1 10*9/L Final   ??? Smear Review Comments 04/11/2020 See Comment* Undefined Final    Slide reviewed.  Blasts Present.

## 2020-04-18 NOTE — Unmapped (Signed)
We'll give you some blood today.    Your posaconazole for fungal prevention should be arriving tonight! Once you get it, you can start 300 mg (3 tablets once daily). DOSE REDUCE venetoclax to 100 mg (1 tablet) once daily with food due to the drug interaction.     (If you don't get the posaconazole tonight, the number to the [facility] is [phone])    You will START levofloxacin 250 mg once daily for bacterial prevention. I sent this to you local pharmacy. This will stop and start based on neutrophils.    Continue valacyclovir 500 mg once daily for viral prevention.    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    We can try to wean down the tranexamic acid if nosebleeds are under control, especially if platelets remains as good as they are today.     Lab on 04/18/2020   Component Date Value Ref Range Status   ??? Sodium 04/18/2020 133* 135 - 145 mmol/L Final   ??? Potassium 04/18/2020 4.6* 3.4 - 4.5 mmol/L Final   ??? Chloride 04/18/2020 105  98 - 107 mmol/L Final   ??? Anion Gap 04/18/2020 2* 5 - 14 mmol/L Final   ??? CO2 04/18/2020 26.0  20.0 - 31.0 mmol/L Final   ??? BUN 04/18/2020 39* 9 - 23 mg/dL Final   ??? Creatinine 04/18/2020 1.49* 0.60 - 1.10 mg/dL Final   ??? BUN/Creatinine Ratio 04/18/2020 26   Final   ??? EGFR CKD-EPI Non-African American,* 04/18/2020 46* >=60 mL/min/1.36m2 Final   ??? EGFR CKD-EPI African American, Male 04/18/2020 53* >=60 mL/min/1.28m2 Final   ??? Glucose 04/18/2020 93  70 - 179 mg/dL Final   ??? Calcium 09/81/1914 9.1  8.7 - 10.4 mg/dL Final   ??? Albumin 78/29/5621 2.9* 3.4 - 5.0 g/dL Final   ??? Total Protein 04/18/2020 8.3* 5.7 - 8.2 g/dL Final   ??? Total Bilirubin 04/18/2020 0.5  0.3 - 1.2 mg/dL Final   ??? AST 30/86/5784 30  <=34 U/L Final   ??? ALT 04/18/2020 12  10 - 49 U/L Final   ??? Alkaline Phosphatase 04/18/2020 81  46 - 116 U/L Final   ??? Magnesium 04/18/2020 1.7  1.6 - 2.6 mg/dL Final   ??? Phosphorus 04/18/2020 3.8  2.4 - 5.1 mg/dL Final   ??? LDH 69/62/9528 494* 120 - 246 U/L Final   ??? ABO Grouping 04/18/2020 O POS   Final   ??? Antibody Screen 04/18/2020 NEG   Final   ??? WBC 04/18/2020 1.5* 4.5 - 11.0 10*9/L Final   ??? RBC 04/18/2020 2.68* 4.50 - 5.90 10*12/L Final   ??? HGB 04/18/2020 7.7* 13.5 - 17.5 g/dL Final   ??? HCT 41/32/4401 23.8* 41.0 - 53.0 % Final   ??? MCV 04/18/2020 88.7  80.0 - 100.0 fL Final   ??? MCH 04/18/2020 28.7  26.0 - 34.0 pg Final   ??? MCHC 04/18/2020 32.4  31.0 - 37.0 g/dL Final   ??? RDW 02/72/5366 21.0* 12.0 - 15.0 % Final   ??? MPV 04/18/2020 10.9* 7.0 - 10.0 fL Final   ??? Platelet 04/18/2020 93* 150 - 440 10*9/L Final   ??? nRBC 04/18/2020 2  <=4 /100 WBCs Final   ??? Variable HGB Concentration 04/18/2020 Slight* Not Present Final   ??? Neutrophils % 04/18/2020 23.6  % Final   ??? Lymphocytes % 04/18/2020 56.5  % Final   ??? Monocytes % 04/18/2020 6.4  % Final   ???  Eosinophils % 04/18/2020 0.0  % Final   ??? Basophils % 04/18/2020 0.9  % Final   ??? Absolute Neutrophils 04/18/2020 0.3* 2.0 - 7.5 10*9/L Final   ??? Absolute Lymphocytes 04/18/2020 0.8* 1.5 - 5.0 10*9/L Final   ??? Absolute Monocytes 04/18/2020 0.1* 0.2 - 0.8 10*9/L Final   ??? Absolute Eosinophils 04/18/2020 0.0  0.0 - 0.4 10*9/L Final   ??? Absolute Basophils 04/18/2020 0.0  0.0 - 0.1 10*9/L Final   ??? Large Unstained Cells 04/18/2020 13* 0 - 4 % Final    Blasts Present.   ??? Microcytosis 04/18/2020 Slight* Not Present Final   ??? Macrocytosis 04/18/2020 Slight* Not Present Final   ??? Anisocytosis 04/18/2020 Moderate* Not Present Final   ??? Hypochromasia 04/18/2020 Moderate* Not Present Final   ??? Smear Review Comments 04/18/2020 See Comment* Undefined Final    Slide reviewed. Blasts Present. Large platelets present.   ??? Dohle Bodies 04/18/2020 Present* Not Present Final

## 2020-04-18 NOTE — Unmapped (Unsigned)
PIV placed by RN AAudet, labs drawn and sent.

## 2020-04-18 NOTE — Unmapped (Signed)
Clinical Assessment Needed For: Dose Change  Medication: Venclexta  Last Fill Date: 07/09/20221  Refill Too Soon until 04/22/2020  Was previous dose already scheduled to fill: No    Notes to Pharmacist: Will retest 04/23/2020

## 2020-04-18 NOTE — Unmapped (Signed)
If you have any questions, please do not hesitate to contact us.    If you experience new or worsening of the following symptoms, please call:  1. Night sweats  2. Abdominal discomfort in the left upper quadrant  3. Feeling full early  4. Excessive fatigue  5. Unintentional weight loss  6. Bone pain  7. Itching after hot showers    When reviewing your results, please remember that the results of many of the tests we order can vary somewhat and that variation often means nothing.  Sometimes when we get results back after your clinic visit, if it looks like there???s some variation of that type, we may decide to recheck things sooner than we discussed in clinic.  If you get a call that we want to recheck things sooner, do not panic. It does not mean that things are going wrong.    For appointments & questions Monday through Friday 8 AM??? 5 PM   please call [phone] or Toll free [phone].    On Nights, Weekends and Holidays  Call [phone] and ask for the adult hematologist/oncologist on call.    [name], AGPCNP-C, MSN, OCN  Nurse Practitioner  Hematologic Malignancies  [facility]    Nurse Navigator: Wynona Meals RN  Questions and appointments M-F 8am - 5pm: [phone] or [phone]    [facility]  [address]  [place], [place] [address]  [email or web address]    Results for orders placed or performed in visit on 04/18/20   Comprehensive Metabolic Panel   Result Value Ref Range    Sodium 133 (L) 135 - 145 mmol/L    Potassium 4.6 (H) 3.4 - 4.5 mmol/L    Chloride 105 98 - 107 mmol/L    Anion Gap 2 (L) 5 - 14 mmol/L    CO2 26.0 20.0 - 31.0 mmol/L    BUN 39 (H) 9 - 23 mg/dL    Creatinine 3.66 (H) 0.60 - 1.10 mg/dL    BUN/Creatinine Ratio 26     EGFR CKD-EPI Non-African American, Male 46 (L) >=60 mL/min/1.53m2    EGFR CKD-EPI African American, Male 53 (L) >=60 mL/min/1.70m2    Glucose 93 70 - 179 mg/dL    Calcium 9.1 8.7 - 44.0 mg/dL    Albumin 2.9 (L) 3.4 - 5.0 g/dL    Total Protein 8.3 (H) 5.7 - 8.2 g/dL    Total Bilirubin 0.5 0.3 - 1.2 mg/dL    AST 30 <=34 U/L    ALT 12 10 - 49 U/L    Alkaline Phosphatase 81 46 - 116 U/L   Magnesium Level   Result Value Ref Range    Magnesium 1.7 1.6 - 2.6 mg/dL   Phosphorus Level   Result Value Ref Range    Phosphorus 3.8 2.4 - 5.1 mg/dL   Lactate dehydrogenase   Result Value Ref Range    LDH 494 (H) 120 - 246 U/L   Type and Screen   Result Value Ref Range    ABO Grouping O POS     Antibody Screen NEG    CBC w/ Differential   Result Value Ref Range    WBC 1.5 (L) 4.5 - 11.0 10*9/L    RBC 2.68 (L) 4.50 - 5.90 10*12/L    HGB 7.7 (L) 13.5 - 17.5 g/dL    HCT 74.2 (L) 59.5 - 53.0 %    MCV 88.7 80.0 - 100.0 fL    MCH 28.7 26.0 - 34.0 pg  MCHC 32.4 31.0 - 37.0 g/dL    RDW 16.1 (H) 09.6 - 15.0 %    MPV 10.9 (H) 7.0 - 10.0 fL    Platelet 93 (L) 150 - 440 10*9/L    nRBC 2 <=4 /100 WBCs    Variable HGB Concentration Slight (A) Not Present    Neutrophils % 23.6 %    Lymphocytes % 56.5 %    Monocytes % 6.4 %    Eosinophils % 0.0 %    Basophils % 0.9 %    Absolute Neutrophils 0.3 (LL) 2.0 - 7.5 10*9/L    Absolute Lymphocytes 0.8 (L) 1.5 - 5.0 10*9/L    Absolute Monocytes 0.1 (L) 0.2 - 0.8 10*9/L    Absolute Eosinophils 0.0 0.0 - 0.4 10*9/L    Absolute Basophils 0.0 0.0 - 0.1 10*9/L    Large Unstained Cells 13 (H) 0 - 4 %    Microcytosis Slight (A) Not Present    Macrocytosis Slight (A) Not Present    Anisocytosis Moderate (A) Not Present    Hypochromasia Moderate (A) Not Present   Morphology Review   Result Value Ref Range    Smear Review Comments See Comment (A) Undefined    Dohle Bodies Present (A) Not Present

## 2020-04-18 NOTE — Unmapped (Signed)
Patient arrived to chair 15 for transfusion.  No complaints noted.  Access of PPIV intact with blood return. . Blood product consent done 04/03/2020. Patient observed for first five minutes of infusion, VSS.  Patient completed and tolerated treatment.  Access checked for blood return, flushed, and removed. AVS printed, patient discharged to home. NAD

## 2020-04-18 NOTE — Unmapped (Signed)
Hospital Outpatient Visit on 04/18/2020   Component Date Value Ref Range Status   ??? Crossmatch 04/18/2020 Compatible   Final   ??? Unit Blood Type 04/18/2020 O Pos   Final   ??? ISBT Number 04/18/2020 5100   Final   ??? Unit # 04/18/2020 [id number]   Final   ??? Status 04/18/2020 Issued   Final   ??? Spec Expiration 04/18/2020 [id number]   Final   ??? Product ID 04/18/2020 Red Blood Cells   Final   ??? PRODUCT CODE 04/18/2020 [id number]   Final   ??? Crossmatch 04/18/2020 Compatible   Final   ??? Unit Blood Type 04/18/2020 O Pos   Final   ??? ISBT Number 04/18/2020 5100   Final   ??? Unit # 04/18/2020 [id number]   Final   ??? Status 04/18/2020 Issued   Final   ??? Spec Expiration 04/18/2020 [id number]   Final   ??? Product ID 04/18/2020 Red Blood Cells   Final   ??? PRODUCT CODE 04/18/2020 [id number]   Final   Lab on 04/18/2020   Component Date Value Ref Range Status   ??? Sodium 04/18/2020 133* 135 - 145 mmol/L Final   ??? Potassium 04/18/2020 4.6* 3.4 - 4.5 mmol/L Final   ??? Chloride 04/18/2020 105  98 - 107 mmol/L Final   ??? Anion Gap 04/18/2020 2* 5 - 14 mmol/L Final   ??? CO2 04/18/2020 26.0  20.0 - 31.0 mmol/L Final   ??? BUN 04/18/2020 39* 9 - 23 mg/dL Final   ??? Creatinine 04/18/2020 1.49* 0.60 - 1.10 mg/dL Final   ??? BUN/Creatinine Ratio 04/18/2020 26   Final   ??? EGFR CKD-EPI Non-African American,* 04/18/2020 46* >=60 mL/min/1.41m2 Final   ??? EGFR CKD-EPI African American, Male 04/18/2020 53* >=60 mL/min/1.23m2 Final   ??? Glucose 04/18/2020 93  70 - 179 mg/dL Final   ??? Calcium 47/42/5956 9.1  8.7 - 10.4 mg/dL Final   ??? Albumin 38/75/6433 2.9* 3.4 - 5.0 g/dL Final   ??? Total Protein 04/18/2020 8.3* 5.7 - 8.2 g/dL Final   ??? Total Bilirubin 04/18/2020 0.5  0.3 - 1.2 mg/dL Final   ??? AST 29/51/8841 30  <=34 U/L Final   ??? ALT 04/18/2020 12  10 - 49 U/L Final   ??? Alkaline Phosphatase 04/18/2020 81  46 - 116 U/L Final   ??? Magnesium 04/18/2020 1.7  1.6 - 2.6 mg/dL Final   ??? Phosphorus 04/18/2020 3.8  2.4 - 5.1 mg/dL Final   ??? LDH 66/02/3015 494* 120 - 246 U/L Final   ??? ABO Grouping 04/18/2020 O POS   Final   ??? Antibody Screen 04/18/2020 NEG   Final   ??? WBC 04/18/2020 1.5* 4.5 - 11.0 10*9/L Final   ??? RBC 04/18/2020 2.68* 4.50 - 5.90 10*12/L Final   ??? HGB 04/18/2020 7.7* 13.5 - 17.5 g/dL Final   ??? HCT 09/30/3233 23.8* 41.0 - 53.0 % Final   ??? MCV 04/18/2020 88.7  80.0 - 100.0 fL Final   ??? MCH 04/18/2020 28.7  26.0 - 34.0 pg Final   ??? MCHC 04/18/2020 32.4  31.0 - 37.0 g/dL Final   ??? RDW 57/32/2025 21.0* 12.0 - 15.0 % Final   ??? MPV 04/18/2020 10.9* 7.0 - 10.0 fL Final   ??? Platelet 04/18/2020 93* 150 - 440 10*9/L Final   ??? nRBC 04/18/2020 2  <=4 /100 WBCs Final   ??? Variable HGB Concentration 04/18/2020 Slight* Not Present Final   ??? Neutrophils % 04/18/2020 23.6  %  Final   ??? Lymphocytes % 04/18/2020 56.5  % Final   ??? Monocytes % 04/18/2020 6.4  % Final   ??? Eosinophils % 04/18/2020 0.0  % Final   ??? Basophils % 04/18/2020 0.9  % Final   ??? Absolute Neutrophils 04/18/2020 0.3* 2.0 - 7.5 10*9/L Final   ??? Absolute Lymphocytes 04/18/2020 0.8* 1.5 - 5.0 10*9/L Final   ??? Absolute Monocytes 04/18/2020 0.1* 0.2 - 0.8 10*9/L Final   ??? Absolute Eosinophils 04/18/2020 0.0  0.0 - 0.4 10*9/L Final   ??? Absolute Basophils 04/18/2020 0.0  0.0 - 0.1 10*9/L Final   ??? Large Unstained Cells 04/18/2020 13* 0 - 4 % Final    Blasts Present.   ??? Microcytosis 04/18/2020 Slight* Not Present Final   ??? Macrocytosis 04/18/2020 Slight* Not Present Final   ??? Anisocytosis 04/18/2020 Moderate* Not Present Final   ??? Hypochromasia 04/18/2020 Moderate* Not Present Final   ??? Smear Review Comments 04/18/2020 See Comment* Undefined Final    Slide reviewed. Blasts Present. Large platelets present.   ??? Dohle Bodies 04/18/2020 Present* Not Present Final     Patient Education        Learning About Blood Transfusions  What is a blood transfusion?     Blood transfusion is a medical treatment to replace the blood or parts of blood that your body has lost. The blood goes through a tube from a bag to an intravenous (IV) catheter and into your vein.  You may need a blood transfusion after losing blood from an injury, a major surgery, an illness that causes bleeding, or an illness that destroys blood cells.  Transfusions are also used to give you the parts of blood???such as platelets, plasma, or substances that cause clotting???that your body needs to fight an illness or stop bleeding.  How is a blood transfusion done?  Before you receive a blood transfusion, your blood is tested to find out what your blood type is. Blood or blood parts that are a match with your blood type are ordered by your doctor. Blood is typed as A, B, AB, or O. It is also typed as Rh-positive or Rh-negative.  Your blood is also screened to look for antibodies that might react with the blood that is given to you. The blood you are getting is checked and rechecked to make sure that it's the right type for you.  A sample of your blood is mixed with a sample of the blood you will receive to check for problems. Before actually giving you the transfusion, a doctor and nurses will look at the label on the package of blood and compare it to your hospital ID bracelet and medical records. The transfusion begins only when all agree that this is the correct blood and that you are the correct person to receive it.  To receive the transfusion, you will have an intravenous (IV) catheter inserted into a vein. A tube connects the catheter to the bag containing the blood, which is placed higher than your body. The blood then flows slowly into your vein. A doctor or nurse will check you several times during the transfusion to watch for a reaction or other problems.  What are the possible risks?  Blood transfusions have many benefits and are often life-saving. But they also have a few risks. Possible risks include:  ?? Your body's reaction to receiving new blood. This may include:  ? Fever.  ? Breathing problems.  ? Allergic reaction, such as hives, swelling,  or a new rash.  ?? An infection from the blood. This risk is small because of the strict rules placed on handling and storing blood. Getting a viral infection, such as HIV or hepatitis B or C, through blood transfusions has become very rare. The [place] Food and Drug Administration (FDA) enforces strict guidelines on the collection, testing, storage, and use of blood.  ?? Getting the wrong blood type by accident. Severe reactions, which can be life-threatening, are very rare.  ?? An infection at the transfusion site, such as redness, swelling, pain, bleeding, or pus.  How can you care for yourself at home?  To prevent infection at the transfusion site  ?? Wash the area daily with warm, soapy water, and pat it dry. Don't use hydrogen peroxide or alcohol, which can slow healing. You may cover the area with a gauze bandage if it weeps or rubs against clothing. Change the bandage every day.  ?? Keep the area clean and dry.  When should you call for help?  Call 911 anytime you think you may need emergency care. For example, call if:  ?? You have severe trouble breathing.  Call your doctor now or seek immediate medical care if:  ?? You have signs of an allergic reaction, such as hives, swelling, or a new rash.  ?? You have a fever.  ?? You feel weaker or more tired than usual.  ?? You have a yellow tint to your skin or the whites of your eyes.  ?? You have signs of an infection at the transfusion site, such as redness, swelling, pain, bleeding, or pus.  Watch closely for changes in your health, and be sure to contact your doctor if you have any problems.  Follow-up care is a key part of your treatment and safety. Be sure to make and go to all appointments, and call your doctor if you are having problems. It's also a good idea to know your test results and keep a list of the medicines you take.  Where can you learn more?  Go to [facility] at [email or web address]  Select Patient Education under [employer]. Enter V588 in the search box to learn more about Learning About Blood Transfusions.  Current as of: June 15, 2019??????????????????????????????Content Version: 12.9  ?? 2006-2021 [employer].   Care instructions adapted under license by [facility]. If you have questions about a medical condition or this instruction, always ask your healthcare professional. [employer] disclaims any warranty or liability for your use of this information.

## 2020-04-18 NOTE — Unmapped (Unsigned)
Error 03/28/2015 when he was seen in consultation by Dr. 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   He last underwent phlebotomy on 05/02/2015. He is maintained on aspirin 81mg  po daily without significant bleeding nor dyspepsia.     He is followed closely by GI (Dr. Mechele Collin) for a history of polyps and Crohn's disease. Last colonoscopy was 2012. He has become iron deficient on his phlebotomy program (ferritin 21 on 11/28/2015).     CBC on 11/28/2015 revealed a hematocrit of 39.3, hemoglobin 11.9, platelets 463,000, WBC 32,000 with an ANC of 25,000. Differential included 71% segs, 4% lymphocytes and 17% monocytes. Peripheral smear revealed leukocytosis with predominantly mature neutrophils, increased monocytes and rare blasts (<1%).    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 Marrow was packed (95-100% cellularity) with pan myelosis, multi-lineage dyspoiesis, and no significant increase in blasts. There was moderate to focally marked reticulin fibrosis (grade 2-3/3). Storage iron was not identified. Flow cytometry revealed non-specific atypical myeloid findings with no increase in blasts. 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 Cytogenetics were normal (46, XY).    He was briefly on hydroxyurea 500 mg a day (12/05/2015 - 12/19/2015). He began allopurinol on 12/19/2015. Uric acid has decreased from 12 to 5.8 to 5.1. Creatinine is stable at 1.62 (CrCl 42 ml/min). LDH was 466 on 01/02/2016. Hematocrit is drifting down (36.9 to 35.4). Platelets are 228,000 and WBC of 21,700.    [facility] review of the bmbx:  Morphologic evaluation, together with review of outside reports of flow cytometry, cytogenetics, and our review of outside special stains, support the above interpretation. Morphologic evaluation of the aspirate smear may be limited by suboptimal/weak staining. While the aspirate differential count does not reflect an increase in blasts, there are scattered immature appearing mononuclear cells that may represent hypogranular and/or maturing granulocytes. Megakaryocytes are dysplastic, showing numerous hypo-loabted and multi-nucleated forms as would be seen in myelodysplastic syndrome, with relatively infrequent hyper-lobated forms. Erythroid precursors show at least mild dysplasia. Granulocytic precursors may show hypogranularity. The bone marrow biopsy demonstrates a hypercellular marrow with increased reticulin fibrosis. Overall, while it is difficult to exclude a post-polycythemic myelofibrosis with progression to dysplasia given the patient's history, an overlap myeloproliferative/myedysplastic neoplasm would also be in the differential. In addition, flow cytometric, CD34 immunohistochemistry on the biopsy and marrow aspirate differential show no definitive increase in blasts, the possible suboptimal staining of the aspirate smear and/or true hypogranularity of the granulocytes may lead to an underestimate of these forms. Repeat marrow evaluation and correlation with myeloid mutation panel (MDS/MPN) is recommended.??    His bone marrow biopsy from 04/07/16 showed a >95% cellular marrow with 5% blasts by manual aspirate differential, mild reticulin fibrosis (but not enough to classify as MF), mild dyserythropoiesis, hypogranular neutrophils (but not enough to classify as MDS) and molecular mutations in: JAK2 V617F, IDH2, RUNX1, SRSF2.  Mutations in RUNX1 and SRSF2 are associated with poorer prognosis and shorter survival in MPN.  However, median overall survival with this is still reasonably long, estimated at 10 years assuming one still classifies this as PV, which technically it is  [[email or web address]].  I do think what we are seeing is PV evolving to MF (especially given the prior outside bmbx that showed moderated to marked fibrotic changes, but only minimal on our sampling  plus the presence of nucleated RBCs in the periphery and elevated LDH.    Assuming this were MF, he would be classified as Intermediate-2 risk by DIPPS and the same by DIPPS Plus, noting that he does have a higher-risk mutational profile with RUNX1 and SRSF2 (though only ASXL1 remained significant in the follow-up analyses of molecular markers).      Review of Systems     Wt Readings from Last 12 Encounters:   04/16/20 68.6 kg (151 lb 3.2 oz)   04/11/20 67.5 kg (148 lb 12.8 oz)   04/09/20 67 kg (147 lb 11.3 oz)   04/07/20 67.7 kg (149 lb 2.3 oz)   04/05/20 71 kg (156 lb 9.6 oz)   04/03/20 73.1 kg (161 lb 1.6 oz)   03/28/20 72.1 kg (158 lb 15.2 oz)   03/12/20 74.8 kg (164 lb 12.8 oz)   03/06/20 76.2 kg (168 lb 1.6 oz)   07/14/19 76 kg (167 lb 8 oz)   07/14/19 75.9 kg (167 lb 5.3 oz)   07/11/19 75.8 kg (167 lb 3.2 oz)     + unintentional weight loss  Very fatigued - has not excerised much at all.  Did make it one mile a few weeks ago.  Has had a pneumonia ~3 months with coughing.  Took azithromycin last week with improvement and now amoxicillin.    (-) fever  (-) night sweats  Intermittent appetite - not clear if is related to early satiety.   No abdominal pain.   Sometimes swollen feet.  Had improved with ruxolitinib but not fedratinib.   No orthopnea. But is not able to speak in full sentences.     Urinary catheter - has been present for a long time.  BPH.    No falls.    No mucocutaneous bleeding, but is bruising easily. Had nose bleeding cauterized and hasn't returned.         10 systems reviewed and negative except as noted in the HPI and Interval History.     Medications     Current Outpatient Medications   Medication Sig Dispense Refill   ??? acetaminophen (TYLENOL) 500 MG tablet Take 500 mg by mouth every eight (8) hours as needed.     ??? allopurinoL (ZYLOPRIM) 300 MG tablet Take 150 mg by mouth daily.      ??? cyanocobalamin 1000 MCG tablet Take 1,000 mcg by mouth daily.      ??? finasteride (PROSCAR) 5 mg tablet Take 5 mg by mouth daily with evening meal.     ??? folic acid (FOLVITE) 1 MG tablet Take 1 mg by mouth daily.     ??? gentamicin (GARAMYCIN) 0.1 % ointment SMARTSIG:1 Topical Every Night     ??? latanoprost (XALATAN) 0.005 % ophthalmic solution 1 drop nightly.     ??? posaconazole (NOXAFIL) 100 mg TbEC delayed released tablet Take 3 tablets (300 mg) by mouth daily. (Patient not taking: Reported on 04/11/2020) 90 tablet 1   ??? sodium bicarbonate 650 mg tablet Take 650 mg by mouth Two (2) times a day.     ??? timolol (TIMOPTIC) 0.5 % ophthalmic solution Apply 1 drop to eye daily.      ??? tranexamic acid 650 mg Tab tablet Take 2 tablets (1,300 mg total) by mouth Three (3) times a day for 7 days. 42 tablet 2   ??? valACYclovir (VALTREX) 500 MG tablet Take 1 tablet (500 mg total) by mouth daily. 30 tablet 11   ??? venetoclax (VENCLEXTA) 100  mg tablet Take 4 tablets (400 mg total) by mouth daily. Take with a meal and water. Do not chew, crush, or break tablets. 120 tablet 1     No current facility-administered medications for this visit.         Allergies   No Known Allergies    Past Medical and Surgical History     Past Medical History:   Diagnosis Date   ??? 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 CKD (chronic kidney disease) stage 3, GFR 30-59 ml/min    ??? Crohn's colitis (CMS-HCC) 1986    No surgeries. Was on 6-MP for awhile; hasn't been on anything 2005.   ??? Glaucoma    ??? 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      Past Surgical History:   Procedure Laterality Date   ??? MOHS SURGERY  2016    left ear for non-melanoma skin cancer       Social History     Social History     Socioeconomic History   ??? Marital status: Married     Spouse name: Not on file   ??? Number of children: Not on file   ??? Years of education: Not on file   ??? Highest education level: Not on file   Occupational History   ??? Not on file   Tobacco Use   ??? Smoking status: Former Smoker   ??? Smokeless tobacco: Never Used   Vaping Use   ??? Vaping Use: Never used   Substance and Sexual Activity   ??? Alcohol use: Yes     Alcohol/week: 4.0 standard drinks     Types: 4 Glasses of wine per week     Comment: occasional    ??? Drug use: Not on file   ??? Sexual activity: Not on file   Other Topics Concern   ??? Not on file   Social History Narrative   ??? Not on file     Social Determinants of Health     Financial Resource Strain: Low Risk    ??? Difficulty of Paying Living Expenses: Not hard at all   Food Insecurity: No Food Insecurity   ??? Worried About Programme researcher, broadcasting/film/video in the Last Year: Never true   ??? Ran Out of Food in the Last Year: Never true   Transportation Needs: No Transportation Needs   ??? Lack of Transportation (Medical): No   ??? Lack of Transportation (Non-Medical): No   Physical Activity:    ??? Days of Exercise per Week:    ??? Minutes of Exercise per Session:    Stress:    ??? Feeling of Stress :    Social Connections:    ??? Frequency of Communication with Friends and Family:    ??? Frequency of Social Gatherings with Friends and Family:    ??? Attends Religious Services:    ??? Database administrator or Organizations:    ??? Attends Banker Meetings:    ??? Marital Status:    Former Audiological scientist; retired several years ago.  Lives with his wife.        Physical Examination     There were no vitals filed for this visit.  GENERAL:  Quite thin - weight was recorded at 168 here, but 153 at home.  His waist is 32. Accompanied by his wife.  In no distress.  Pleasant and alert.   RESP: Breathing comfortably.  Speaking comfortably in full sentences.   During the 60 minute visit, 2 episodes of ~3 seconds of non-productive cough.    NEURO: A&Ox4.  CN II-XII grossly intact and symmetric.   PSYCH: Normal affect, mood is so so.  Forward thinking.  Goal-directed and linear. EXT:  1+ pedal edema bilaterally.   SKIN:  Some bruising on arms, but no petechiae, no rashes.   .     Laboratory Testing and Imaging     Hospital Outpatient Visit on 04/16/2020   Component Date Value Ref Range Status   ??? Platelet 04/16/2020 39* 150 - 440 10*9/L Final   ??? Unit Blood Type 04/17/2020 A Pos   Final   ??? ISBT Number 04/17/2020 6200   Final   ??? Unit # 04/17/2020 [id number]   Final   ??? Status 04/17/2020 Transfused   Final   ??? Product ID 04/17/2020 Platelets   Final   ??? PRODUCT CODE 04/17/2020 [id number]   Final   Lab on 04/16/2020   Component Date Value Ref Range Status   ??? WBC 04/16/2020 1.7* 4.5 - 11.0 10*9/L Final   ??? RBC 04/16/2020 2.94* 4.50 - 5.90 10*12/L Final   ??? HGB 04/16/2020 8.6* 13.5 - 17.5 g/dL Final   ??? HCT 11/91/4782 26.0* 41.0 - 53.0 % Final   ??? MCV 04/16/2020 88.6  80.0 - 100.0 fL Final   ??? MCH 04/16/2020 29.2  26.0 - 34.0 pg Final   ??? MCHC 04/16/2020 33.0  31.0 - 37.0 g/dL Final   ??? RDW 95/62/1308 20.9* 12.0 - 15.0 % Final   ??? MPV 04/16/2020 13.4* 7.0 - 10.0 fL Final   ??? Platelet 04/16/2020 26* 150 - 440 10*9/L Final   ??? nRBC 04/16/2020 2  <=4 /100 WBCs Final   ??? Variable HGB Concentration 04/16/2020 Slight* Not Present Final   ??? Neutrophils % 04/16/2020 21.6  % Final   ??? Lymphocytes % 04/16/2020 61.4  % Final   ??? Monocytes % 04/16/2020 5.2  % Final   ??? Eosinophils % 04/16/2020 0.1  % Final   ??? Basophils % 04/16/2020 0.4  % Final   ??? Absolute Neutrophils 04/16/2020 0.4* 2.0 - 7.5 10*9/L Final   ??? Absolute Lymphocytes 04/16/2020 1.1* 1.5 - 5.0 10*9/L Final   ??? Absolute Monocytes 04/16/2020 0.1* 0.2 - 0.8 10*9/L Final   ??? Absolute Eosinophils 04/16/2020 0.0  0.0 - 0.4 10*9/L Final   ??? Absolute Basophils 04/16/2020 0.0  0.0 - 0.1 10*9/L Final   ??? Large Unstained Cells 04/16/2020 11* 0 - 4 % Final    Blasts present.    ??? Microcytosis 04/16/2020 Slight* Not Present Final   ??? Macrocytosis 04/16/2020 Slight* Not Present Final   ??? Anisocytosis 04/16/2020 Moderate* Not Present Final   ??? Hypochromasia 04/16/2020 Marked* Not Present Final   ??? Smear Review Comments 04/16/2020 See Comment* Undefined Final    Slide reviewed. Blasts present.    Hospital Outpatient Visit on 04/13/2020   Component Date Value Ref Range Status   ??? Platelet 04/13/2020 26* 150 - 440 10*9/L Final   ??? Unit Blood Type 04/14/2020 AB Pos   Final   ??? ISBT Number 04/14/2020 8400   Final   ??? Unit # 04/14/2020 [id number]   Final   ??? Status 04/14/2020 Transfused   Final   ??? Product ID 04/14/2020 Platelets   Final   ??? PRODUCT CODE 04/14/2020 [id number]   Final   ??? Crossmatch 04/14/2020 Compatible   Final   ???  Unit Blood Type 04/14/2020 O Pos   Final   ??? ISBT Number 04/14/2020 5100   Final   ??? Unit # 04/14/2020 [id number]   Final   ??? Status 04/14/2020 Transfused   Final   ??? Product ID 04/14/2020 Red Blood Cells   Final   ??? PRODUCT CODE 04/14/2020 [id number]   Final   ??? Crossmatch 04/14/2020 Compatible   Final   ??? Unit Blood Type 04/14/2020 O Pos   Final   ??? ISBT Number 04/14/2020 5100   Final   ??? Unit # 04/14/2020 [id number]   Final   ??? Status 04/14/2020 Transfused   Final   ??? Product ID 04/14/2020 Red Blood Cells   Final   ??? PRODUCT CODE 04/14/2020 [id number]   Final   ??? Unit Blood Type 04/14/2020 B Pos   Final   ??? ISBT Number 04/14/2020 7300   Final   ??? Unit # 04/14/2020 [id number]   Final   ??? Status 04/14/2020 Transfused   Final   ??? Product ID 04/14/2020 Platelets   Final   ??? PRODUCT CODE 04/14/2020 [id number]   Final   ??? Platelet 04/13/2020 47* 150 - 440 10*9/L Final   Lab on 04/13/2020   Component Date Value Ref Range Status   ??? ABO Grouping 04/13/2020 O POS   Final   ??? Antibody Screen 04/13/2020 NEG   Final   ??? WBC 04/13/2020 1.7* 4.5 - 11.0 10*9/L Final   ??? RBC 04/13/2020 2.82* 4.50 - 5.90 10*12/L Final   ??? HGB 04/13/2020 7.8* 13.5 - 17.5 g/dL Final   ??? HCT 03/15/7627 24.8* 41.0 - 53.0 % Final   ??? MCV 04/13/2020 88.0  80.0 - 100.0 fL Final   ??? MCH 04/13/2020 27.7  26.0 - 34.0 pg Final   ??? MCHC 04/13/2020 31.4  31.0 - 37.0 g/dL Final   ??? RDW 31/51/7616 22.7* 12.0 - 15.0 % Final   ??? MPV 04/13/2020 8.8  7.0 - 10.0 fL Final   ??? Platelet 04/13/2020 14* 150 - 440 10*9/L Final   ??? Variable HGB Concentration 04/13/2020 Slight* Not Present Final   ??? Neutrophils % 04/13/2020 21.7  % Final    This is an appended report.  These results have been appended to a previously final verified report.   ??? Lymphocytes % 04/13/2020 65.4  % Final    This is an appended report.  These results have been appended to a previously final verified report.   ??? Monocytes % 04/13/2020 5.6  % Final    This is an appended report.  These results have been appended to a previously final verified report.   ??? Eosinophils % 04/13/2020 0.1  % Final    This is an appended report.  These results have been appended to a previously final verified report.   ??? Basophils % 04/13/2020 0.1  % Final    This is an appended report.  These results have been appended to a previously final verified report.   ??? Absolute Neutrophils 04/13/2020 0.4* 2.0 - 7.5 10*9/L Final    This is an appended report.  These results have been appended to a previously final verified report.   ??? Absolute Lymphocytes 04/13/2020 1.1* 1.5 - 5.0 10*9/L Final    This is an appended report.  These results have been appended to a previously final verified report.   ??? Absolute Monocytes 04/13/2020 0.1* 0.2 - 0.8 10*9/L Final    This is an appended report.  These results  have been appended to a previously final verified report.   ??? Absolute Eosinophils 04/13/2020 0.0  0.0 - 0.4 10*9/L Final    This is an appended report.  These results have been appended to a previously final verified report.   ??? Absolute Basophils 04/13/2020 0.0  0.0 - 0.1 10*9/L Final    This is an appended report.  These results have been appended to a previously final verified report.   ??? Large Unstained Cells 04/13/2020 7* 0 - 4 % Final    Comment: Slide Reviewed.  Blasts Present.                             This is an appended report.  These results have been appended to a previously final verified report.   ??? Microcytosis 04/13/2020 Slight* Not Present Final   ??? Macrocytosis 04/13/2020 Slight* Not Present Final   ??? Anisocytosis 04/13/2020 Marked* Not Present Final   ??? Hypochromasia 04/13/2020 Marked* Not Present Final   ??? Smear Review Comments 04/13/2020 See Comment* Undefined Final    Slide Reviewed.  Blasts Present.     Hospital Outpatient Visit on 04/11/2020   Component Date Value Ref Range Status   ??? Unit Blood Type 04/12/2020 A Pos   Final   ??? ISBT Number 04/12/2020 6200   Final   ??? Unit # 04/12/2020 [id number]   Final   ??? Status 04/12/2020 Transfused   Final   ??? Product ID 04/12/2020 Platelets   Final   ??? PRODUCT CODE 04/12/2020 [id number]   Final   ??? Unit Blood Type 04/12/2020 B Pos   Final   ??? ISBT Number 04/12/2020 7300   Final   ??? Unit # 04/12/2020 [id number]   Final   ??? Status 04/12/2020 Transfused   Final   ??? Product ID 04/12/2020 Platelets   Final   ??? PRODUCT CODE 04/12/2020 [id number]   Final   Lab on 04/11/2020   Component Date Value Ref Range Status   ??? Sodium 04/11/2020 133* 135 - 145 mmol/L Final   ??? Potassium 04/11/2020 4.8* 3.4 - 4.5 mmol/L Final   ??? Chloride 04/11/2020 105  98 - 107 mmol/L Final   ??? Anion Gap 04/11/2020 5  5 - 14 mmol/L Final   ??? CO2 04/11/2020 23.0  20.0 - 31.0 mmol/L Final   ??? BUN 04/11/2020 51* 9 - 23 mg/dL Final   ??? Creatinine 04/11/2020 1.42* 0.60 - 1.10 mg/dL Final   ??? BUN/Creatinine Ratio 04/11/2020 36   Final   ??? EGFR CKD-EPI Non-African American,* 04/11/2020 49* >=60 mL/min/1.75m2 Final   ??? EGFR CKD-EPI African American, Male 04/11/2020 56* >=60 mL/min/1.56m2 Final   ??? Glucose 04/11/2020 92  70 - 179 mg/dL Final   ??? Calcium 41/32/4401 9.1  8.7 - 10.4 mg/dL Final   ??? Albumin 02/72/5366 3.0* 3.4 - 5.0 g/dL Final   ??? Total Protein 04/11/2020 8.4* 5.7 - 8.2 g/dL Final   ??? Total Bilirubin 04/11/2020 0.6  0.3 - 1.2 mg/dL Final   ??? AST 44/11/4740 29  <=34 U/L Final   ??? ALT 04/11/2020 16  10 - 49 U/L Final   ??? Alkaline Phosphatase 04/11/2020 82  46 - 116 U/L Final   ??? Phosphorus 04/11/2020 4.3  2.4 - 5.1 mg/dL Final   ??? LDH 59/56/3875 188  120 - 246 U/L Final   ??? Uric Acid 04/11/2020 4.6  3.7 - 9.2 mg/dL Final   ???  WBC 04/11/2020 2.2* 4.5 - 11.0 10*9/L Final   ??? RBC 04/11/2020 3.06* 4.50 - 5.90 10*12/L Final   ??? HGB 04/11/2020 8.5* 13.5 - 17.5 g/dL Final   ??? HCT 16/06/9603 27.0* 41.0 - 53.0 % Final   ??? MCV 04/11/2020 88.2  80.0 - 100.0 fL Final   ??? MCH 04/11/2020 27.8  26.0 - 34.0 pg Final   ??? MCHC 04/11/2020 31.5  31.0 - 37.0 g/dL Final   ??? RDW 54/05/8118 22.7* 12.0 - 15.0 % Final   ??? MPV 04/11/2020 15.3* 7.0 - 10.0 fL Final   ??? Platelet 04/11/2020 26* 150 - 440 10*9/L Final   ??? Variable HGB Concentration 04/11/2020 Moderate* Not Present Final   ??? Neutrophil Left Shift 04/11/2020 1+* Not Present Final   ??? Microcytosis 04/11/2020 Slight* Not Present Final   ??? Macrocytosis 04/11/2020 Slight* Not Present Final   ??? Anisocytosis 04/11/2020 Marked* Not Present Final   ??? Hypochromasia 04/11/2020 Marked* Not Present Final   ??? Neutrophils % 04/11/2020 37  % Final   ??? Lymphocytes % 04/11/2020 51  % Final   ??? Monocytes % 04/11/2020 6  % Final   ??? Eosinophils % 04/11/2020 0  % Final   ??? Basophils % 04/11/2020 2  % Final   ??? Blasts % 04/11/2020 4* <=0 % Final   ??? Absolute Neutrophils 04/11/2020 0.8* 2.0 - 7.5 10*9/L Final   ??? Absolute Lymphocytes 04/11/2020 1.1* 1.5 - 5.0 10*9/L Final   ??? Absolute Monocytes 04/11/2020 0.1* 0.2 - 0.8 10*9/L Final   ??? Absolute Eosinophils 04/11/2020 0.0  0.0 - 0.4 10*9/L Final   ??? Absolute Basophils 04/11/2020 0.0  0.0 - 0.1 10*9/L Final   ??? Smear Review Comments 04/11/2020 See Comment* Undefined Final    Slide reviewed.  Blasts Present.

## 2020-04-19 NOTE — Unmapped (Signed)
[facility] Clinical Assessment & Refill Coordination Note    Howard Villarreal, DOB: 07-21-1946  Phone: [phone] (home) [phone] (work)    All above HIPAA information was verified with patient's family member, wife.     Was a Nurse, learning disability used for this call? No    Specialty Medication(s):   Hematology/Oncology: Venclexta     Current Outpatient Medications   Medication Sig Dispense Refill   ??? acetaminophen (TYLENOL) 500 MG tablet Take 500 mg by mouth every eight (8) hours as needed.     ??? allopurinoL (ZYLOPRIM) 300 MG tablet Take 150 mg by mouth daily.      ??? cyanocobalamin, vitamin B-12, (VITAMIN B-12) 1000 MCG tablet Take 1,000 mcg by mouth daily.     ??? finasteride (PROSCAR) 5 mg tablet Take 5 mg by mouth daily with evening meal.     ??? folic acid (FOLVITE) 1 MG tablet Take 1 mg by mouth daily.     ??? latanoprost (XALATAN) 0.005 % ophthalmic solution 1 drop nightly.     ??? levoFLOXacin (LEVAQUIN) 250 MG tablet Take 1 tablet (250 mg total) by mouth daily. 30 tablet 1   ??? posaconazole (NOXAFIL) 100 mg TbEC delayed released tablet Take 3 tablets (300 mg) by mouth daily. 90 tablet 1   ??? sodium bicarbonate 650 mg tablet Take 650 mg by mouth Two (2) times a day.     ??? timolol (TIMOPTIC) 0.5 % ophthalmic solution Apply 1 drop to eye daily.      ??? valACYclovir (VALTREX) 500 MG tablet Take 1 tablet (500 mg total) by mouth daily. 30 tablet 11   ??? venetoclax (VENCLEXTA) 100 mg tablet Take 1 tablet (100 mg total) by mouth daily. Take with a meal and water. Do not chew, crush, or break tablets. 30 tablet 1     No current facility-administered medications for this visit.        Changes to medications: [name] reports no changes at this time.    No Known Allergies    Changes to allergies: No    SPECIALTY MEDICATION ADHERENCE     Venclexta 100 mg: 44 days of medicine on hand   posaconazole 100 mg: 30 days of medicine on hand (received from [facility] assistance)    Medication Adherence    Patient reported X missed doses in the last month: 0  Specialty Medication: venclexta 100 mg daily (dose decreased due to starting posaconazole)  Patient is on additional specialty medications: No  Informant: spouse  Confirmed plan for next specialty medication refill: delivery by pharmacy          Specialty medication(s) dose(s) confirmed: Initiating posaconzole on 04/19/20 - Venclexta dose will decrease starting 04/19/20 to 100 mg daily.     Are there any concerns with adherence? No    Adherence counseling provided? Not needed    CLINICAL MANAGEMENT AND INTERVENTION      Clinical Benefit Assessment:    Do you feel the medicine is effective or helping your condition? Yes    Clinical Benefit counseling provided? Not needed    Adverse Effects Assessment:    Are you experiencing any side effects? No    Are you experiencing difficulty administering your medicine? 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 For example, brushing your teeth or getting up in the morning. 0  Have you discussed this with your provider? Not needed    Therapy Appropriateness:    Is therapy appropriate? Yes, therapy is appropriate and should be continued    DISEASE/MEDICATION-SPECIFIC INFORMATION      N/A    PATIENT SPECIFIC NEEDS     - Does the patient have any physical, cognitive, or cultural barriers? No    - Is the patient high risk? Yes, patient is taking oral chemotherapy. Appropriateness of therapy as been assessed.     - Does the patient require a Care Management Plan? No     - Does the patient require physician intervention or other additional services (i.e. nutrition, smoking cessation, social work)? No      SHIPPING     Specialty Medication(s) to be Shipped:   Hematology/Oncology: none    Other medication(s) to be shipped: No additional medications requested for fill at this time     Changes to insurance: No    Delivery Scheduled: Patient declined refill at this time due to has over 30 day supply on hand.     Medication will be delivered via NA to the confirmed NA address in [facility].    The patient will receive a drug information handout for each medication shipped and additional FDA Medication Guides as required.  Verified that patient has previously received a Conservation officer, historic buildings.    All of the patient's questions and concerns have been addressed.    Breck Coons [facility] Specialty Pharmacist

## 2020-04-20 ENCOUNTER — Other Ambulatory Visit: Admit: 2020-04-20 | Discharge: 2020-04-21 | Payer: MEDICARE

## 2020-04-20 ENCOUNTER — Ambulatory Visit: Admit: 2020-04-20 | Discharge: 2020-04-21 | Payer: MEDICARE

## 2020-04-20 DIAGNOSIS — D471 Chronic myeloproliferative disease: Secondary | ICD-10-CM | POA: Diagnosis not present

## 2020-04-20 DIAGNOSIS — C9200 Acute myeloblastic leukemia, not having achieved remission: Secondary | ICD-10-CM | POA: Diagnosis not present

## 2020-04-20 LAB — CBC W/ AUTO DIFF
BASOPHILS ABSOLUTE COUNT: 0 10*9/L (ref 0.0–0.1)
BASOPHILS RELATIVE PERCENT: 1.7 %
EOSINOPHILS RELATIVE PERCENT: 0.2 %
HEMATOCRIT: 26.1 % — ABNORMAL LOW (ref 41.0–53.0)
HEMOGLOBIN: 8.8 g/dL — ABNORMAL LOW (ref 13.5–17.5)
LARGE UNSTAINED CELLS: 15 % — ABNORMAL HIGH (ref 0–4)
LYMPHOCYTES ABSOLUTE COUNT: 0.7 10*9/L — ABNORMAL LOW (ref 1.5–5.0)
LYMPHOCYTES RELATIVE PERCENT: 49.8 %
MEAN CORPUSCULAR HEMOGLOBIN CONC: 33.6 g/dL (ref 31.0–37.0)
MEAN CORPUSCULAR HEMOGLOBIN: 28.8 pg (ref 26.0–34.0)
MEAN CORPUSCULAR VOLUME: 85.7 fL (ref 80.0–100.0)
MEAN PLATELET VOLUME: 11.1 fL — ABNORMAL HIGH (ref 7.0–10.0)
MONOCYTES ABSOLUTE COUNT: 0.1 10*9/L — ABNORMAL LOW (ref 0.2–0.8)
MONOCYTES RELATIVE PERCENT: 5.7 %
NEUTROPHILS ABSOLUTE COUNT: 0.4 10*9/L — CL (ref 2.0–7.5)
NEUTROPHILS RELATIVE PERCENT: 27.4 %
NUCLEATED RED BLOOD CELLS: 3 /100{WBCs} (ref <=4)
PLATELET COUNT: 236 10*9/L (ref 150–440)
RED CELL DISTRIBUTION WIDTH: 20 % — ABNORMAL HIGH (ref 12.0–15.0)
WBC ADJUSTED: 1.5 10*9/L — ABNORMAL LOW (ref 4.5–11.0)

## 2020-04-20 LAB — SMEAR REVIEW

## 2020-04-20 LAB — EOSINOPHILS RELATIVE PERCENT: Eosinophils/100 leukocytes:NFr:Pt:Bld:Qn:Automated count: 0.2

## 2020-04-20 NOTE — Unmapped (Signed)
Patient arrived to chair 3 for lab check.  No complaints noted.  Access of PIV intact * and removed.    Labs redrawn d/t clotting.     Labs resulted and does not meet parameters for transfusion.  Declined AVS and discharged to home.

## 2020-04-21 MED ORDER — TRANEXAMIC ACID 650 MG TABLET
0 days
Start: 2020-04-21 — End: ?

## 2020-04-23 ENCOUNTER — Ambulatory Visit: Admit: 2020-04-23 | Discharge: 2020-04-24 | Payer: MEDICARE | Attending: Pharmacist | Primary: Pharmacist

## 2020-04-23 ENCOUNTER — Ambulatory Visit: Admit: 2020-04-23 | Discharge: 2020-04-24 | Payer: MEDICARE

## 2020-04-23 ENCOUNTER — Ambulatory Visit: Admit: 2020-04-23 | Discharge: 2020-04-24 | Payer: MEDICARE | Attending: Adult Health | Primary: Adult Health

## 2020-04-23 ENCOUNTER — Other Ambulatory Visit: Admit: 2020-04-23 | Discharge: 2020-04-24 | Payer: MEDICARE

## 2020-04-23 DIAGNOSIS — C9200 Acute myeloblastic leukemia, not having achieved remission: Secondary | ICD-10-CM | POA: Diagnosis not present

## 2020-04-23 DIAGNOSIS — Z87891 Personal history of nicotine dependence: Secondary | ICD-10-CM | POA: Diagnosis not present

## 2020-04-23 DIAGNOSIS — K5090 Crohn's disease, unspecified, without complications: Secondary | ICD-10-CM | POA: Diagnosis not present

## 2020-04-23 DIAGNOSIS — Z792 Long term (current) use of antibiotics: Secondary | ICD-10-CM | POA: Diagnosis not present

## 2020-04-23 DIAGNOSIS — R918 Other nonspecific abnormal finding of lung field: Secondary | ICD-10-CM | POA: Diagnosis not present

## 2020-04-23 DIAGNOSIS — N183 Chronic kidney disease, stage 3 unspecified: Secondary | ICD-10-CM | POA: Diagnosis not present

## 2020-04-23 DIAGNOSIS — R5081 Fever presenting with conditions classified elsewhere: Secondary | ICD-10-CM | POA: Diagnosis not present

## 2020-04-23 DIAGNOSIS — R509 Fever, unspecified: Secondary | ICD-10-CM | POA: Diagnosis not present

## 2020-04-23 DIAGNOSIS — D471 Chronic myeloproliferative disease: Principal | ICD-10-CM

## 2020-04-23 LAB — CBC W/ AUTO DIFF
BASOPHILS ABSOLUTE COUNT: 0.1 10*9/L (ref 0.0–0.1)
BASOPHILS RELATIVE PERCENT: 4.1 %
EOSINOPHILS ABSOLUTE COUNT: 0 10*9/L (ref 0.0–0.4)
HEMATOCRIT: 29.7 % — ABNORMAL LOW (ref 41.0–53.0)
HEMOGLOBIN: 9.6 g/dL — ABNORMAL LOW (ref 13.5–17.5)
LARGE UNSTAINED CELLS: 17 % — ABNORMAL HIGH (ref 0–4)
LYMPHOCYTES ABSOLUTE COUNT: 0.8 10*9/L — ABNORMAL LOW (ref 1.5–5.0)
LYMPHOCYTES RELATIVE PERCENT: 43.4 %
MEAN CORPUSCULAR HEMOGLOBIN CONC: 32.4 g/dL (ref 31.0–37.0)
MEAN CORPUSCULAR VOLUME: 88.7 fL (ref 80.0–100.0)
MEAN PLATELET VOLUME: 10.8 fL — ABNORMAL HIGH (ref 7.0–10.0)
MONOCYTES ABSOLUTE COUNT: 0.1 10*9/L — ABNORMAL LOW (ref 0.2–0.8)
MONOCYTES RELATIVE PERCENT: 6.8 %
NEUTROPHILS ABSOLUTE COUNT: 0.5 10*9/L — ABNORMAL LOW (ref 2.0–7.5)
NEUTROPHILS RELATIVE PERCENT: 28.1 %
PLATELET COUNT: 516 10*9/L — ABNORMAL HIGH (ref 150–440)
RED BLOOD CELL COUNT: 3.35 10*12/L — ABNORMAL LOW (ref 4.50–5.90)
RED CELL DISTRIBUTION WIDTH: 19.7 % — ABNORMAL HIGH (ref 12.0–15.0)
WBC ADJUSTED: 1.7 10*9/L — ABNORMAL LOW (ref 4.5–11.0)

## 2020-04-23 LAB — SLIDE REVIEW

## 2020-04-23 LAB — URINALYSIS WITH CULTURE REFLEX
BILIRUBIN UA: NEGATIVE
BLOOD UA: NEGATIVE
GLUCOSE UA: NEGATIVE
KETONES UA: NEGATIVE
NITRITE UA: NEGATIVE
PH UA: 5 (ref 5.0–9.0)
PROTEIN UA: 30 — AB
SPECIFIC GRAVITY UA: 1.015 (ref 1.003–1.030)
SQUAMOUS EPITHELIAL: <1 /HPF (ref 0–5)
UROBILINOGEN UA: 0.2
WBC UA: 1 /HPF (ref <=2)

## 2020-04-23 LAB — RBC UA: Erythrocytes:Naric:Pt:Urine sed:Qn:Microscopy.light.HPF: <1

## 2020-04-23 LAB — GIANT PLATELETS

## 2020-04-23 LAB — MONOCYTES ABSOLUTE COUNT: Monocytes:NCnc:Pt:Bld:Qn:Automated count: 0.1 — ABNORMAL LOW

## 2020-04-23 NOTE — Unmapped (Addendum)
Your platelets are great!  Your neutrophil count is 0.5, which is better than before - however, given your symptoms, you'll continue on levaquin.    We are going to do a fever work up for you today.  The shaking chills are concerning.  If you feel worse, or have a fever, you need to go to the emergency room.        If you have any questions, please do not hesitate to contact us.    If you experience new or worsening of the following symptoms, please call:  1. Night sweats  2. Abdominal discomfort in the left upper quadrant  3. Feeling full early  4. Excessive fatigue  5. Unintentional weight loss  6. Bone pain  7. Itching after hot showers    When reviewing your results, please remember that the results of many of the tests we order can vary somewhat and that variation often means nothing.  Sometimes when we get results back after your clinic visit, if it looks like there???s some variation of that type, we may decide to recheck things sooner than we discussed in clinic.  If you get a call that we want to recheck things sooner, do not panic. It does not mean that things are going wrong.    For appointments & questions Monday through Friday 8 AM??? 5 PM   please call [phone] or Toll free [phone].    On Nights, Weekends and Holidays  Call [phone] and ask for the adult hematologist/oncologist on call.    [name], AGPCNP-C, MSN, OCN  Nurse Practitioner  Hematologic Malignancies  [facility]    Nurse Navigator: Wynona Meals RN  Questions and appointments M-F 8am - 5pm: [phone] or [phone]    [facility]  [address]  [place], [place] [address]  [email or web address]    Results for orders placed or performed in visit on 04/23/20   Type and Screen   Result Value Ref Range    Antibody Screen NEG    CBC w/ Differential   Result Value Ref Range    WBC 1.7 (L) 4.5 - 11.0 10*9/L    RBC 3.35 (L) 4.50 - 5.90 10*12/L    HGB 9.6 (L) 13.5 - 17.5 g/dL    HCT 36.6 (L) 44.0 - 53.0 %    MCV 88.7 80.0 - 100.0 fL    MCH 28.7 26.0 - 34.0 pg    MCHC 32.4 31.0 - 37.0 g/dL    RDW 34.7 (H) 42.5 - 15.0 %    MPV 10.8 (H) 7.0 - 10.0 fL    Platelet 516 (H) 150 - 440 10*9/L    Neutrophils % 28.1 %    Lymphocytes % 43.4 %    Monocytes % 6.8 %    Eosinophils % 0.4 %    Basophils % 4.1 %    Absolute Neutrophils 0.5 (L) 2.0 - 7.5 10*9/L    Absolute Lymphocytes 0.8 (L) 1.5 - 5.0 10*9/L    Absolute Monocytes 0.1 (L) 0.2 - 0.8 10*9/L    Absolute Eosinophils 0.0 0.0 - 0.4 10*9/L    Absolute Basophils 0.1 0.0 - 0.1 10*9/L    Large Unstained Cells 17 (H) 0 - 4 %    Microcytosis Slight (A) Not Present    Macrocytosis Slight (A) Not Present    Anisocytosis Moderate (A) Not Present    Hypochromasia Slight (A) Not Present   Morphology Review   Result Value Ref Range    Smear  Review Comments See Comment (A) Undefined    Giant Platelets Present (A) Not Present

## 2020-04-23 NOTE — Unmapped (Signed)
1439 Blood cultures X 2 collected peripherally, PIV removed intact, Dressing applied. Urine culture collected and sent for analysis.  Informed [name] NP of red splotchy rash on patients arms bilaterally to evaluate.    1452 Patient transported to Xray for CXR via wheelchair and CMA.

## 2020-04-23 NOTE — Unmapped (Signed)
Malignant Hematology Established Visit      Patient: Howard Villarreal  MRN: [id number]  DOB: 09-03-1946  Date of Visit: 04/23/2020    Local Oncologist: Dr. Nelva Nay    Reason for Visit   Howard Villarreal is a 74 y.o. 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  However, he has been having chills the past few days.  Given neutropenia, this is concerning.  Will plan to do fever workup today; but since he is otherwise asymptomatic, will send him home. We discussed the emergent nature of fevers higher than 100.4, and we strongly recommend that he visit the ER when he has a fever; he should tell them he is a cancer patient with a fever.  He and his wife verbalize understanding.      Plan   1. BC x 2, UA, CXR  2. Continue venetoclax  3. Continue posa, valtrex, levofloxacin   4. BMBx 8/6  5. Follow up 8/9    [name], RN, MSN, AGPCNP-C  Nurse Practitioner  Hematologic Malignancies  [facility]  [phone] (phone)  [phone] (fax)  [email or web address]    I personally spent 45 minutes face-to-face and non-face-to-face in the care of this patient, which includes all pre, intra, and post visit time on the date of service.    Interval History:  Doing fair.  Since starting the levofloxacin, he has started to have chills/tremors.  These are intermittent.  Temporal temp of 99.7 yesterday.  Took 2 tylenol and felt better.  Mild knee and ankle joint pain.       Otherwise, denies new bone pain, fevers, chills, night sweats, lumps/bumps, tongue swelling, shortness of breath, syncope, lightheadedness, constipation or diarrhea, nausea or vomiting, very easy bruising or bleeding, or urinary changes.       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 He has had polycythemia since at least 2013 (no labs prior to that, but on 08/24/12, WBC 21, Hgb 19.3, Hct 59.8%, Plts 551). Hematocrit was 62.1 with a hemoglobin of 19.8 on 03/28/2015. 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 Erythropoietin level was 1.1 (low).     He began a phlebotomy program on 03/28/2015 when he was seen in consultation by Dr. Merlene Pulling and diagnosed with PV to maintain a hematocrit goal of < 45%; he was likely iron deficient at diagnosis, with MCV 28fL.   He last underwent phlebotomy on 05/02/2015. He is maintained on aspirin 81mg  po daily without significant bleeding nor dyspepsia.     He is followed closely by GI (Dr. Mechele Collin) for a history of polyps and Crohn's disease. Last colonoscopy was 2012. He has become iron deficient on his phlebotomy program (ferritin 21 on 11/28/2015).     CBC on 11/28/2015 revealed a hematocrit of 39.3, hemoglobin 11.9, platelets 463,000, WBC 32,000 with an ANC of 25,000. Differential included 71% segs, 4% lymphocytes and 17% monocytes. Peripheral smear revealed leukocytosis with predominantly mature neutrophils, increased monocytes and rare blasts (<1%).    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 Marrow was packed (95-100% cellularity) with pan myelosis,  multi-lineage dyspoiesis, and no significant increase in blasts. There was moderate to focally marked reticulin fibrosis (grade 2-3/3). Storage iron was not identified. Flow cytometry revealed non-specific atypical myeloid findings with no increase in blasts. 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 Cytogenetics were normal (46, XY).    He was briefly on hydroxyurea 500 mg a day (12/05/2015 - 12/19/2015). He began allopurinol on 12/19/2015. Uric acid has decreased from 12 to 5.8 to 5.1. Creatinine is stable at 1.62 (CrCl 42 ml/min). LDH was 466 on 01/02/2016. Hematocrit is drifting down (36.9 to 35.4). Platelets are 228,000 and WBC of 21,700.    [place] [facility] review of the bmbx:  Morphologic evaluation, together with review of outside reports of flow cytometry, cytogenetics, and our review of outside special stains, support the above interpretation. Morphologic evaluation of the aspirate smear may be limited by suboptimal/weak staining. While the aspirate differential count does not reflect an increase in blasts, there are scattered immature appearing mononuclear cells that may represent hypogranular and/or maturing granulocytes. Megakaryocytes are dysplastic, showing numerous hypo-loabted and multi-nucleated forms as would be seen in myelodysplastic syndrome, with relatively infrequent hyper-lobated forms. Erythroid precursors show at least mild dysplasia. Granulocytic precursors may show hypogranularity. The bone marrow biopsy demonstrates a hypercellular marrow with increased reticulin fibrosis. Overall, while it is difficult to exclude a post-polycythemic myelofibrosis with progression to dysplasia given the patient's history, an overlap myeloproliferative/myedysplastic neoplasm would also be in the differential. In addition, flow cytometric, CD34 immunohistochemistry on the biopsy and marrow aspirate differential show no definitive increase in blasts, the possible suboptimal staining of the aspirate smear and/or true hypogranularity of the granulocytes may lead to an underestimate of these forms. Repeat marrow evaluation and correlation with myeloid mutation panel (MDS/MPN) is recommended.??    His bone marrow biopsy from 04/07/16 showed a >95% cellular marrow with 5% blasts by manual aspirate differential, mild reticulin fibrosis (but not enough to classify as MF), mild dyserythropoiesis, hypogranular neutrophils (but not enough to classify as MDS) and molecular mutations in: JAK2 V617F, IDH2, RUNX1, SRSF2. Mutations in RUNX1 and SRSF2 are associated with poorer prognosis and shorter survival in MPN.  However, median overall survival with this is still reasonably long, estimated at 10 years assuming one still classifies this as PV, which technically it is  [[email or web address]].  I do think what we are seeing is PV evolving to MF (especially given the prior outside bmbx that showed moderated to marked fibrotic changes, but only minimal on our sampling plus the presence of nucleated RBCs in the periphery and elevated LDH.    Assuming this were MF, he would be classified as Intermediate-2 risk by DIPPS and the same by DIPPS Plus, noting that he does have a higher-risk mutational profile with RUNX1 and SRSF2 (though only ASXL1 remained significant in the follow-up analyses of molecular markers).      Review of Systems     Wt Readings from Last 12 Encounters:   04/23/20 72.2 kg (159 lb 1.6 oz)   04/23/20 71.5 kg (157 lb 10.1 oz)   04/20/20 69.6 kg (153 lb 5.3 oz)   04/18/20 68.8 kg (151 lb 9.6 oz)   04/16/20 68.6 kg (151 lb 3.2 oz)   04/11/20 67.5 kg (148 lb 12.8 oz)   04/09/20 67 kg (147 lb 11.3 oz)   04/07/20 67.7 kg (149 lb 2.3 oz)   04/05/20 71 kg (156 lb 9.6 oz)   04/03/20 73.1 kg (161 lb  1.6 oz)   03/28/20 72.1 kg (158 lb 15.2 oz)   03/12/20 74.8 kg (164 lb 12.8 oz)     + unintentional weight loss  Very fatigued - has not excerised much at all.  Did make it one mile a few weeks ago.  Has had a pneumonia ~3 months with coughing.  Took azithromycin last week with improvement and now amoxicillin.    (-) fever  (-) night sweats  Intermittent appetite - not clear if is related to early satiety.   No abdominal pain.   Sometimes swollen feet.  Had improved with ruxolitinib but not fedratinib.   No orthopnea. But is not able to speak in full sentences.     Urinary catheter - has been present for a long time.  BPH.    No falls.    No mucocutaneous bleeding, but is bruising easily. Had nose bleeding cauterized and hasn't returned.         10 systems reviewed and negative except as noted in the HPI and Interval History.     Medications     Current Outpatient Medications   Medication Sig Dispense Refill   ??? acetaminophen (TYLENOL) 500 MG tablet Take 500 mg by mouth every eight (8) hours as needed.     ??? allopurinoL (ZYLOPRIM) 300 MG tablet Take 150 mg by mouth daily.      ??? cyanocobalamin, vitamin B-12, (VITAMIN B-12) 1000 MCG tablet Take 1,000 mcg by mouth daily.     ??? finasteride (PROSCAR) 5 mg tablet Take 5 mg by mouth daily with evening meal.     ??? folic acid (FOLVITE) 1 MG tablet Take 1 mg by mouth daily.     ??? latanoprost (XALATAN) 0.005 % ophthalmic solution 1 drop nightly.     ??? levoFLOXacin (LEVAQUIN) 500 MG tablet TAKE 1/2 TABLET BY MOUTH EVERY DAY     ??? posaconazole (NOXAFIL) 100 mg TbEC delayed released tablet Take 3 tablets (300 mg) by mouth daily. 90 tablet 1   ??? sodium bicarbonate 650 mg tablet Take 650 mg by mouth Two (2) times a day.     ??? timolol (TIMOPTIC) 0.5 % ophthalmic solution Apply 1 drop to eye daily.      ??? tranexamic acid 650 mg Tab tablet      ??? valACYclovir (VALTREX) 500 MG tablet Take 1 tablet (500 mg total) by mouth daily. 30 tablet 11   ??? venetoclax (VENCLEXTA) 100 mg tablet Take 1 tablet (100 mg total) by mouth daily. Take with a meal and water. Do not chew, crush, or break tablets. 30 tablet 1   ??? levoFLOXacin (LEVAQUIN) 250 MG tablet Take 1 tablet (250 mg total) by mouth daily. (Patient not taking: Reported on 04/23/2020) 30 tablet 1     No current facility-administered medications for this visit.         Allergies   No Known Allergies    Past Medical and Surgical History     Past Medical History:   Diagnosis Date   ??? 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 CKD (chronic kidney disease) stage 3, GFR 30-59 ml/min    ??? Crohn's colitis (CMS-HCC) 1986    No surgeries. Was on 6-MP for awhile; hasn't been on anything 2005. ??? Glaucoma    ??? 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      Past Surgical History:   Procedure Laterality Date   ??? MOHS SURGERY  2016  left ear for non-melanoma skin cancer       Social History     Social History     Socioeconomic History   ??? Marital status: Married     Spouse name: Not on file   ??? Number of children: Not on file   ??? Years of education: Not on file   ??? Highest education level: Not on file   Occupational History   ??? Not on file   Tobacco Use   ??? Smoking status: Former Smoker   ??? Smokeless tobacco: Never Used   Vaping Use   ??? Vaping Use: Never used   Substance and Sexual Activity   ??? Alcohol use: Yes     Alcohol/week: 4.0 standard drinks     Types: 4 Glasses of wine per week     Comment: occasional    ??? Drug use: Not on file   ??? Sexual activity: Not on file   Other Topics Concern   ??? Not on file   Social History Narrative   ??? Not on file     Social Determinants of Health     Financial Resource Strain: Low Risk    ??? Difficulty of Paying Living Expenses: Not hard at all   Food Insecurity: No Food Insecurity   ??? Worried About Programme researcher, broadcasting/film/video in the Last Year: Never true   ??? Ran Out of Food in the Last Year: Never true   Transportation Needs: No Transportation Needs   ??? Lack of Transportation (Medical): No   ??? Lack of Transportation (Non-Medical): No   Physical Activity:    ??? Days of Exercise per Week:    ??? Minutes of Exercise per Session:    Stress:    ??? Feeling of Stress :    Social Connections:    ??? Frequency of Communication with Friends and Family:    ??? Frequency of Social Gatherings with Friends and Family:    ??? Attends Religious Services:    ??? Database administrator or Organizations:    ??? Attends Banker Meetings:    ??? Marital Status:    Former Audiological scientist; retired several years ago.  Lives with his wife.        Physical Examination     Vitals:    04/23/20 1305   BP: 140/63   Pulse: 103   Resp: 16   Temp: 35.8 ??C (96.4 ??F)   TempSrc: Temporal   SpO2: 100%   Weight: 72.2 kg (159 lb 1.6 oz)   Height: 182.9 cm (6')     GENERAL:   Accompanied by his wife.  In no distress.  Pleasant and alert.   RESP: Breathing comfortably.  Speaking comfortably in full sentences.   During the 60 minute visit, 2 episodes of ~3 seconds of non-productive cough.    NEURO: A&Ox4.  CN II-XII grossly intact and symmetric.   PSYCH: Normal affect, mood is so so.  Forward thinking.  Goal-directed and linear.   EXT:  1+ pedal edema bilaterally.   SKIN:  Some bruising on arms, but no petechiae, no rashes.   .     Laboratory Testing and Imaging     Results for orders placed or performed in visit on 04/23/20   CBC w/ Differential   Result Value Ref Range    WBC 1.7 (L) 4.5 - 11.0 10*9/L    RBC 3.35 (L) 4.50 - 5.90 10*12/L    HGB 9.6 (L) 13.5 -  17.5 g/dL    HCT 03.4 (L) 74.2 - 53.0 %    MCV 88.7 80.0 - 100.0 fL    MCH 28.7 26.0 - 34.0 pg    MCHC 32.4 31.0 - 37.0 g/dL    RDW 59.5 (H) 63.8 - 15.0 %    MPV 10.8 (H) 7.0 - 10.0 fL    Platelet 516 (H) 150 - 440 10*9/L    Neutrophils % 28.1 %    Lymphocytes % 43.4 %    Monocytes % 6.8 %    Eosinophils % 0.4 %    Basophils % 4.1 %    Absolute Neutrophils 0.5 (L) 2.0 - 7.5 10*9/L    Absolute Lymphocytes 0.8 (L) 1.5 - 5.0 10*9/L    Absolute Monocytes 0.1 (L) 0.2 - 0.8 10*9/L    Absolute Eosinophils 0.0 0.0 - 0.4 10*9/L    Absolute Basophils 0.1 0.0 - 0.1 10*9/L    Large Unstained Cells 17 (H) 0 - 4 %    Microcytosis Slight (A) Not Present    Macrocytosis Slight (A) Not Present    Anisocytosis Moderate (A) Not Present    Hypochromasia Slight (A) Not Present   Morphology Review   Result Value Ref Range    Smear Review Comments See Comment (A) Undefined    Giant Platelets Present (A) Not Present

## 2020-04-25 ENCOUNTER — Emergency Department: Payer: Medicare Other

## 2020-04-25 ENCOUNTER — Telehealth: Payer: Self-pay

## 2020-04-25 ENCOUNTER — Other Ambulatory Visit: Payer: Self-pay

## 2020-04-25 ENCOUNTER — Inpatient Hospital Stay
Admission: EM | Admit: 2020-04-25 | Discharge: 2020-04-30 | DRG: 871 | Disposition: A | Discharge status: Home / Self Care | Payer: Medicare Other | Attending: Internal Medicine | Admitting: Internal Medicine

## 2020-04-25 ENCOUNTER — Ambulatory Visit: Payer: Medicare Other | Admitting: Pulmonary Disease

## 2020-04-25 DIAGNOSIS — N183 Chronic kidney disease, stage 3 unspecified: Secondary | ICD-10-CM

## 2020-04-25 DIAGNOSIS — J189 Pneumonia, unspecified organism: Secondary | ICD-10-CM | POA: Diagnosis present

## 2020-04-25 DIAGNOSIS — I4510 Unspecified right bundle-branch block: Secondary | ICD-10-CM | POA: Diagnosis present

## 2020-04-25 DIAGNOSIS — D45 Polycythemia vera: Secondary | ICD-10-CM | POA: Diagnosis present

## 2020-04-25 DIAGNOSIS — Z79899 Other long term (current) drug therapy: Secondary | ICD-10-CM

## 2020-04-25 DIAGNOSIS — I130 Hypertensive heart and chronic kidney disease with heart failure and stage 1 through stage 4 chronic kidney disease, or unspecified chronic kidney disease: Secondary | ICD-10-CM | POA: Diagnosis present

## 2020-04-25 DIAGNOSIS — R5081 Fever presenting with conditions classified elsewhere: Secondary | ICD-10-CM | POA: Diagnosis present

## 2020-04-25 DIAGNOSIS — I5031 Acute diastolic (congestive) heart failure: Secondary | ICD-10-CM | POA: Diagnosis not present

## 2020-04-25 DIAGNOSIS — I351 Nonrheumatic aortic (valve) insufficiency: Secondary | ICD-10-CM | POA: Diagnosis not present

## 2020-04-25 DIAGNOSIS — T451X5A Adverse effect of antineoplastic and immunosuppressive drugs, initial encounter: Secondary | ICD-10-CM | POA: Diagnosis present

## 2020-04-25 DIAGNOSIS — D7581 Myelofibrosis: Secondary | ICD-10-CM | POA: Diagnosis present

## 2020-04-25 DIAGNOSIS — E872 Acidosis: Secondary | ICD-10-CM | POA: Diagnosis present

## 2020-04-25 DIAGNOSIS — R0602 Shortness of breath: Secondary | ICD-10-CM | POA: Diagnosis not present

## 2020-04-25 DIAGNOSIS — E876 Hypokalemia: Secondary | ICD-10-CM | POA: Diagnosis present

## 2020-04-25 DIAGNOSIS — N1832 Chronic kidney disease, stage 3b: Secondary | ICD-10-CM | POA: Diagnosis present

## 2020-04-25 DIAGNOSIS — N400 Enlarged prostate without lower urinary tract symptoms: Secondary | ICD-10-CM | POA: Diagnosis present

## 2020-04-25 DIAGNOSIS — X58XXXA Exposure to other specified factors, initial encounter: Secondary | ICD-10-CM | POA: Diagnosis present

## 2020-04-25 DIAGNOSIS — C9200 Acute myeloblastic leukemia, not having achieved remission: Secondary | ICD-10-CM | POA: Diagnosis present

## 2020-04-25 DIAGNOSIS — R509 Fever, unspecified: Secondary | ICD-10-CM | POA: Diagnosis not present

## 2020-04-25 DIAGNOSIS — Z9841 Cataract extraction status, right eye: Secondary | ICD-10-CM

## 2020-04-25 DIAGNOSIS — E871 Hypo-osmolality and hyponatremia: Secondary | ICD-10-CM | POA: Diagnosis present

## 2020-04-25 DIAGNOSIS — R000 Tachycardia, unspecified: Secondary | ICD-10-CM | POA: Diagnosis not present

## 2020-04-25 DIAGNOSIS — D649 Anemia, unspecified: Secondary | ICD-10-CM | POA: Diagnosis present

## 2020-04-25 DIAGNOSIS — L989 Disorder of the skin and subcutaneous tissue, unspecified: Secondary | ICD-10-CM | POA: Diagnosis present

## 2020-04-25 DIAGNOSIS — H409 Unspecified glaucoma: Secondary | ICD-10-CM | POA: Diagnosis present

## 2020-04-25 DIAGNOSIS — D709 Neutropenia, unspecified: Secondary | ICD-10-CM

## 2020-04-25 DIAGNOSIS — R339 Retention of urine, unspecified: Secondary | ICD-10-CM | POA: Diagnosis present

## 2020-04-25 DIAGNOSIS — Z85828 Personal history of other malignant neoplasm of skin: Secondary | ICD-10-CM

## 2020-04-25 DIAGNOSIS — Z20822 Contact with and (suspected) exposure to covid-19: Secondary | ICD-10-CM | POA: Diagnosis present

## 2020-04-25 DIAGNOSIS — Z961 Presence of intraocular lens: Secondary | ICD-10-CM | POA: Diagnosis present

## 2020-04-25 DIAGNOSIS — J9601 Acute respiratory failure with hypoxia: Secondary | ICD-10-CM | POA: Diagnosis not present

## 2020-04-25 DIAGNOSIS — K5000 Crohn's disease of small intestine without complications: Secondary | ICD-10-CM | POA: Diagnosis present

## 2020-04-25 DIAGNOSIS — D631 Anemia in chronic kidney disease: Secondary | ICD-10-CM | POA: Diagnosis present

## 2020-04-25 DIAGNOSIS — D701 Agranulocytosis secondary to cancer chemotherapy: Secondary | ICD-10-CM | POA: Diagnosis present

## 2020-04-25 DIAGNOSIS — R188 Other ascites: Secondary | ICD-10-CM | POA: Diagnosis not present

## 2020-04-25 DIAGNOSIS — N189 Chronic kidney disease, unspecified: Secondary | ICD-10-CM | POA: Diagnosis not present

## 2020-04-25 DIAGNOSIS — Z9842 Cataract extraction status, left eye: Secondary | ICD-10-CM

## 2020-04-25 DIAGNOSIS — Z8701 Personal history of pneumonia (recurrent): Secondary | ICD-10-CM

## 2020-04-25 DIAGNOSIS — N1330 Unspecified hydronephrosis: Secondary | ICD-10-CM | POA: Diagnosis not present

## 2020-04-25 DIAGNOSIS — J90 Pleural effusion, not elsewhere classified: Secondary | ICD-10-CM | POA: Diagnosis not present

## 2020-04-25 DIAGNOSIS — A419 Sepsis, unspecified organism: Principal | ICD-10-CM | POA: Diagnosis present

## 2020-04-25 DIAGNOSIS — I083 Combined rheumatic disorders of mitral, aortic and tricuspid valves: Secondary | ICD-10-CM | POA: Diagnosis present

## 2020-04-25 DIAGNOSIS — Z807 Family history of other malignant neoplasms of lymphoid, hematopoietic and related tissues: Secondary | ICD-10-CM

## 2020-04-25 DIAGNOSIS — J811 Chronic pulmonary edema: Secondary | ICD-10-CM | POA: Diagnosis not present

## 2020-04-25 DIAGNOSIS — N179 Acute kidney failure, unspecified: Secondary | ICD-10-CM | POA: Diagnosis present

## 2020-04-25 DIAGNOSIS — R918 Other nonspecific abnormal finding of lung field: Secondary | ICD-10-CM | POA: Diagnosis not present

## 2020-04-25 DIAGNOSIS — N1831 Chronic kidney disease, stage 3a: Secondary | ICD-10-CM | POA: Diagnosis not present

## 2020-04-25 DIAGNOSIS — Z87891 Personal history of nicotine dependence: Secondary | ICD-10-CM

## 2020-04-25 DIAGNOSIS — I340 Nonrheumatic mitral (valve) insufficiency: Secondary | ICD-10-CM | POA: Diagnosis not present

## 2020-04-25 DIAGNOSIS — D630 Anemia in neoplastic disease: Secondary | ICD-10-CM | POA: Diagnosis present

## 2020-04-25 DIAGNOSIS — I350 Nonrheumatic aortic (valve) stenosis: Secondary | ICD-10-CM | POA: Diagnosis not present

## 2020-04-25 LAB — URINALYSIS, COMPLETE (UACMP) WITH MICROSCOPIC
Bacteria, UA: NONE SEEN
Bilirubin Urine: NEGATIVE
Glucose, UA: NEGATIVE mg/dL
Hgb urine dipstick: NEGATIVE
Ketones, ur: NEGATIVE mg/dL
Leukocytes,Ua: NEGATIVE
Nitrite: NEGATIVE
Protein, ur: 30 mg/dL — AB
Specific Gravity, Urine: 1.015 (ref 1.005–1.030)
pH: 5 (ref 5.0–8.0)

## 2020-04-25 LAB — CBC WITH DIFFERENTIAL/PLATELET
Abs Immature Granulocytes: 0.09 10*3/uL — ABNORMAL HIGH (ref 0.00–0.07)
Basophils Absolute: 0 10*3/uL (ref 0.0–0.1)
Basophils Relative: 0 %
Eosinophils Absolute: 0 10*3/uL (ref 0.0–0.5)
Eosinophils Relative: 0 %
HCT: 25.7 % — ABNORMAL LOW (ref 39.0–52.0)
Hemoglobin: 8.4 g/dL — ABNORMAL LOW (ref 13.0–17.0)
Immature Granulocytes: 7 %
Lymphocytes Relative: 38 %
Lymphs Abs: 0.5 10*3/uL — ABNORMAL LOW (ref 0.7–4.0)
MCH: 28 pg (ref 26.0–34.0)
MCHC: 32.7 g/dL (ref 30.0–36.0)
MCV: 85.7 fL (ref 80.0–100.0)
Monocytes Absolute: 0.3 10*3/uL (ref 0.1–1.0)
Monocytes Relative: 26 %
Neutro Abs: 0.4 10*3/uL — ABNORMAL LOW (ref 1.7–7.7)
Neutrophils Relative %: 29 %
Platelets: 482 10*3/uL — ABNORMAL HIGH (ref 150–400)
RBC: 3 MIL/uL — ABNORMAL LOW (ref 4.22–5.81)
RDW: 20.9 % — ABNORMAL HIGH (ref 11.5–15.5)
Smear Review: NORMAL
WBC: 1.3 10*3/uL — CL (ref 4.0–10.5)
nRBC: 3 % — ABNORMAL HIGH (ref 0.0–0.2)

## 2020-04-25 LAB — PROTIME-INR
INR: 1.3 — ABNORMAL HIGH (ref 0.8–1.2)
Prothrombin Time: 15.9 seconds — ABNORMAL HIGH (ref 11.4–15.2)

## 2020-04-25 LAB — COMPREHENSIVE METABOLIC PANEL
ALT: 13 U/L (ref 0–44)
AST: 52 U/L — ABNORMAL HIGH (ref 15–41)
Albumin: 3 g/dL — ABNORMAL LOW (ref 3.5–5.0)
Alkaline Phosphatase: 89 U/L (ref 38–126)
Anion gap: 11 (ref 5–15)
BUN: 34 mg/dL — ABNORMAL HIGH (ref 8–23)
CO2: 20 mmol/L — ABNORMAL LOW (ref 22–32)
Calcium: 8.3 mg/dL — ABNORMAL LOW (ref 8.9–10.3)
Chloride: 96 mmol/L — ABNORMAL LOW (ref 98–111)
Creatinine, Ser: 1.84 mg/dL — ABNORMAL HIGH (ref 0.61–1.24)
GFR calc Af Amer: 41 mL/min — ABNORMAL LOW (ref >60)
GFR calc non Af Amer: 36 mL/min — ABNORMAL LOW (ref >60)
Glucose, Bld: 118 mg/dL — ABNORMAL HIGH (ref 70–99)
Potassium: 3.8 mmol/L (ref 3.5–5.1)
Sodium: 127 mmol/L — ABNORMAL LOW (ref 135–145)
Total Bilirubin: 0.7 mg/dL (ref 0.3–1.2)
Total Protein: 8.8 g/dL — ABNORMAL HIGH (ref 6.5–8.1)

## 2020-04-25 LAB — LACTIC ACID, PLASMA
Lactic Acid, Venous: 1.9 mmol/L (ref 0.5–1.9)
Lactic Acid, Venous: 2.4 mmol/L (ref 0.5–1.9)

## 2020-04-25 LAB — SARS CORONAVIRUS 2 BY RT PCR (HOSPITAL ORDER, PERFORMED IN ~~LOC~~ HOSPITAL LAB): SARS Coronavirus 2: NEGATIVE

## 2020-04-25 MED ORDER — ALLOPURINOL 300 MG PO TABS
150.0000 mg | ORAL_TABLET | Freq: Every day | ORAL | Status: DC
  Administered 2020-04-26 – 2020-04-30 (×5): 150 mg via ORAL
  Filled 2020-04-25 (×3): qty 0.5
  Filled 2020-04-25: qty 1
  Filled 2020-04-25: qty 0.5

## 2020-04-25 MED ORDER — LACTATED RINGERS IV BOLUS (SEPSIS)
1000.0000 mL | Freq: Once | INTRAVENOUS | Status: AC
  Administered 2020-04-25: 1000 mL via INTRAVENOUS

## 2020-04-25 MED ORDER — LACTATED RINGERS IV SOLN: INTRAVENOUS | Status: DC

## 2020-04-25 MED ORDER — SODIUM BICARBONATE 650 MG PO TABS
650.0000 mg | ORAL_TABLET | Freq: Two times a day (BID) | ORAL | Status: DC
  Administered 2020-04-25 – 2020-04-30 (×9): 650 mg via ORAL
  Filled 2020-04-25 (×11): qty 1

## 2020-04-25 MED ORDER — SODIUM CHLORIDE 0.9 % IV SOLN
2.0000 g | Freq: Once | INTRAVENOUS | Status: AC
  Administered 2020-04-25: 2 g via INTRAVENOUS
  Filled 2020-04-25: qty 2

## 2020-04-25 MED ORDER — LACTATED RINGERS IV BOLUS (SEPSIS)
250.0000 mL | Freq: Once | INTRAVENOUS | Status: AC
  Administered 2020-04-25: 250 mL via INTRAVENOUS

## 2020-04-25 MED ORDER — VANCOMYCIN HCL IN DEXTROSE 1-5 GM/200ML-% IV SOLN
1000.0000 mg | Freq: Once | INTRAVENOUS | Status: AC
  Administered 2020-04-25: 1000 mg via INTRAVENOUS
  Filled 2020-04-25: qty 200

## 2020-04-25 MED ORDER — ACETAMINOPHEN 650 MG RE SUPP: 650.0000 mg | Freq: Four times a day (QID) | RECTAL | Status: DC | PRN

## 2020-04-25 MED ORDER — VALACYCLOVIR HCL 500 MG PO TABS
500.0000 mg | ORAL_TABLET | Freq: Every day | ORAL | Status: DC
  Administered 2020-04-26 – 2020-04-30 (×5): 500 mg via ORAL
  Filled 2020-04-25 (×5): qty 1

## 2020-04-25 MED ORDER — ONDANSETRON HCL 4 MG PO TABS: 4.0000 mg | ORAL_TABLET | Freq: Four times a day (QID) | ORAL | Status: DC | PRN

## 2020-04-25 MED ORDER — POSACONAZOLE 100 MG PO TBEC
300.0000 mg | DELAYED_RELEASE_TABLET | Freq: Every day | ORAL | Status: DC
  Administered 2020-04-26 – 2020-04-30 (×5): 300 mg via ORAL
  Filled 2020-04-25 (×5): qty 3

## 2020-04-25 MED ORDER — ACETAMINOPHEN 500 MG PO TABS
1000.0000 mg | ORAL_TABLET | Freq: Once | ORAL | Status: AC
  Administered 2020-04-25: 1000 mg via ORAL
  Filled 2020-04-25: qty 2

## 2020-04-25 MED ORDER — VITAMIN B-12 1000 MCG PO TABS
1000.0000 ug | ORAL_TABLET | Freq: Every day | ORAL | Status: DC
  Administered 2020-04-26 – 2020-04-30 (×5): 1000 ug via ORAL
  Filled 2020-04-25 (×5): qty 1

## 2020-04-25 MED ORDER — FOLIC ACID 1 MG PO TABS
1.0000 mg | ORAL_TABLET | Freq: Every day | ORAL | Status: DC
  Administered 2020-04-26 – 2020-04-30 (×5): 1 mg via ORAL
  Filled 2020-04-25 (×5): qty 1

## 2020-04-25 MED ORDER — ACETAMINOPHEN 325 MG PO TABS: 650.0000 mg | ORAL_TABLET | Freq: Four times a day (QID) | ORAL | Status: DC | PRN

## 2020-04-25 MED ORDER — FINASTERIDE 5 MG PO TABS
5.0000 mg | ORAL_TABLET | Freq: Every day | ORAL | Status: DC
  Administered 2020-04-25 – 2020-04-29 (×5): 5 mg via ORAL
  Filled 2020-04-25 (×5): qty 1

## 2020-04-25 MED ORDER — HEPARIN SODIUM (PORCINE) 5000 UNIT/ML IJ SOLN
5000.0000 [IU] | Freq: Three times a day (TID) | INTRAMUSCULAR | Status: DC
  Administered 2020-04-25 – 2020-04-30 (×11): 5000 [IU] via SUBCUTANEOUS
  Filled 2020-04-25 (×12): qty 1

## 2020-04-25 MED ORDER — ONDANSETRON HCL 4 MG/2ML IJ SOLN: 4.0000 mg | Freq: Four times a day (QID) | INTRAMUSCULAR | Status: DC | PRN

## 2020-04-25 MED ORDER — SODIUM CHLORIDE 0.9 % IV SOLN
2.0000 g | Freq: Two times a day (BID) | INTRAVENOUS | Status: DC
  Administered 2020-04-26 – 2020-04-30 (×9): 2 g via INTRAVENOUS
  Filled 2020-04-25 (×12): qty 2

## 2020-04-25 MED ORDER — METRONIDAZOLE IN NACL 5-0.79 MG/ML-% IV SOLN
500.0000 mg | Freq: Once | INTRAVENOUS | Status: AC
  Administered 2020-04-25: 500 mg via INTRAVENOUS
  Filled 2020-04-25: qty 100

## 2020-04-25 MED ORDER — SODIUM CHLORIDE 0.9 % IV SOLN
500.0000 mg | INTRAVENOUS | Status: DC
  Administered 2020-04-26 – 2020-04-29 (×4): 500 mg via INTRAVENOUS
  Filled 2020-04-25 (×4): qty 500

## 2020-04-25 NOTE — Unmapped (Signed)
[facility] Triage Note     Patient: Howard Villarreal     Reason for call:  return call    Time call returned: [phone]     Phone Assessment: Spoke with [name], she was calling because [name] has a temperature of 101 f, she denies that he has any cough, runny nose, congestion, no nausea, vomiting, has diarrhea due to Chrohns. He self catheterizes so he cannot tell if he is having any urgency, frequency or burning.  Patient is on levofloxacin, posaconazole and vacyclovir.     Triage Recommendations: Informed her that he should be see in a ED for infectious workup again since he is still having fevers. His neutrophil count is 0.5 on Monday and he is at risk for infection.  She said he will not go to the hospital because of how poor his care was the last time he was inpatient. Explained that he is at risk for infection and he can go to any ED.  If they would like to talk with the on call fellow this RN provided the number [phone] to see what their advice would be. She agreed to call them to advise and will go to ED if instructed to.     Patient Response: greatful     Outstanding tasks: Patient to be seen in an ED for infectious workup.     Patient Pharmacy has been verified and primary pharmacy has been marked as preferred

## 2020-04-25 NOTE — Unmapped (Signed)
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 He continues to take TXA despite minimal nose bleeding. He denies nausea or constipation, has struggled with some diarrhea (has history of bowel disease). Denies fever.     On labs, his Hgb is 7.7 (needs PRBC), PLT 93, ANC 0.3. CMP stable, including SCr to 1.49.    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     04/01/2020 -  Chemotherapy    IP/OP AML AZACITIDINE + VENETOCLAX  azacitidine 75 mg/m2 SQ on days 1-7, venetoclax ramp up week 1; dose dependent, then azacitidine 75 mg/m2 SQ on days 1-7 every 28 days.         Weight and Vitals:  Wt Readings from Last 3 Encounters:   04/23/20 72.2 kg (159 lb 1.6 oz)   04/23/20 71.5 kg (157 lb 10.1 oz)   04/20/20 69.6 kg (153 lb 5.3 oz)     Temp Readings from Last 3 Encounters:   04/23/20 35.8 ??C (96.4 ??F) (Temporal)   04/20/20 36.6 ??C (97.9 ??F) (Oral)   04/18/20 36.7 ??C (98.1 ??F) (Oral)     BP Readings from Last 3 Encounters:   04/23/20 140/63   04/20/20 108/55   04/18/20 111/60     Pulse Readings from Last 3 Encounters:   04/23/20 103   04/20/20 83   04/18/20 75       Pertinent Labs:  Hospital Outpatient Visit on 04/18/2020   Component Date Value Ref Range Status   ??? Crossmatch 04/19/2020 Compatible   Final   ??? Unit Blood Type 04/19/2020 O Pos   Final   ??? ISBT Number 04/19/2020 5100   Final   ??? Unit # 04/19/2020 [id number]   Final   ??? Status 04/19/2020 Transfused   Final   ??? Product ID 04/19/2020 Red Blood Cells   Final   ??? PRODUCT CODE 04/19/2020 [id number]   Final   ??? Crossmatch 04/19/2020 Compatible   Final   ??? Unit Blood Type 04/19/2020 O Pos   Final   ??? ISBT Number 04/19/2020 5100   Final   ??? Unit # 04/19/2020 [id number]   Final   ??? Status 04/19/2020 Transfused   Final   ??? Product ID 04/19/2020 Red Blood Cells   Final   ??? PRODUCT CODE 04/19/2020 [id number]   Final   Lab on 04/18/2020   Component Date Value Ref Range Status   ??? Sodium 04/18/2020 133* 135 - 145 mmol/L Final   ??? Potassium 04/18/2020 4.6* 3.4 - 4.5 mmol/L Final   ??? Chloride 04/18/2020 105  98 - 107 mmol/L Final   ??? Anion Gap 04/18/2020 2* 5 - 14 mmol/L  Final   ??? CO2 04/18/2020 26.0  20.0 - 31.0 mmol/L Final   ??? BUN 04/18/2020 39* 9 - 23 mg/dL Final   ??? Creatinine 04/18/2020 1.49* 0.60 - 1.10 mg/dL Final   ??? BUN/Creatinine Ratio 04/18/2020 26   Final   ??? EGFR CKD-EPI Non-African American,* 04/18/2020 46* >=60 mL/min/1.69m2 Final   ??? EGFR CKD-EPI African American, Male 04/18/2020 53* >=60 mL/min/1.68m2 Final   ??? Glucose 04/18/2020 93  70 - 179 mg/dL Final   ??? Calcium 14/78/2956 9.1  8.7 - 10.4 mg/dL Final   ??? Albumin 21/30/8657 2.9* 3.4 - 5.0 g/dL Final   ??? Total Protein 04/18/2020 8.3* 5.7 - 8.2 g/dL Final   ??? Total Bilirubin 04/18/2020 0.5  0.3 - 1.2 mg/dL Final   ??? AST 84/69/6295 30  <=34 U/L Final   ??? ALT 04/18/2020 12  10 - 49 U/L Final   ??? Alkaline Phosphatase 04/18/2020 81  46 - 116 U/L Final   ??? Magnesium 04/18/2020 1.7  1.6 - 2.6 mg/dL Final   ??? Phosphorus 04/18/2020 3.8  2.4 - 5.1 mg/dL Final   ??? LDH 28/41/3244 494* 120 - 246 U/L Final   ??? ABO Grouping 04/18/2020 O POS   Final   ??? Antibody Screen 04/18/2020 NEG   Final   ??? WBC 04/18/2020 1.5* 4.5 - 11.0 10*9/L Final   ??? RBC 04/18/2020 2.68* 4.50 - 5.90 10*12/L Final   ??? HGB 04/18/2020 7.7* 13.5 - 17.5 g/dL Final   ??? HCT 09/24/7251 23.8* 41.0 - 53.0 % Final   ??? MCV 04/18/2020 88.7  80.0 - 100.0 fL Final   ??? MCH 04/18/2020 28.7  26.0 - 34.0 pg Final   ??? MCHC 04/18/2020 32.4  31.0 - 37.0 g/dL Final   ??? RDW 66/44/0347 21.0* 12.0 - 15.0 % Final   ??? MPV 04/18/2020 10.9* 7.0 - 10.0 fL Final   ??? Platelet 04/18/2020 93* 150 - 440 10*9/L Final   ??? nRBC 04/18/2020 2  <=4 /100 WBCs Final   ??? Variable HGB Concentration 04/18/2020 Slight* Not Present Final   ??? Neutrophils % 04/18/2020 23.6  % Final   ??? Lymphocytes % 04/18/2020 56.5  % Final   ??? Monocytes % 04/18/2020 6.4  % Final   ??? Eosinophils % 04/18/2020 0.0  % Final   ??? Basophils % 04/18/2020 0.9  % Final   ??? Absolute Neutrophils 04/18/2020 0.3* 2.0 - 7.5 10*9/L Final   ??? Absolute Lymphocytes 04/18/2020 0.8* 1.5 - 5.0 10*9/L Final   ??? Absolute Monocytes 04/18/2020 0.1* 0.2 - 0.8 10*9/L Final   ??? Absolute Eosinophils 04/18/2020 0.0  0.0 - 0.4 10*9/L Final   ??? Absolute Basophils 04/18/2020 0.0  0.0 - 0.1 10*9/L Final   ??? Large Unstained Cells 04/18/2020 13* 0 - 4 % Final    Blasts Present.   ??? Microcytosis 04/18/2020 Slight* Not Present Final   ??? Macrocytosis 04/18/2020 Slight* Not Present Final   ??? Anisocytosis 04/18/2020 Moderate* Not Present Final   ??? Hypochromasia 04/18/2020 Moderate* Not Present Final   ??? Smear Review Comments 04/18/2020 See Comment* Undefined Final    Slide reviewed. Blasts Present. Large platelets present.   ??? Dohle Bodies 04/18/2020 Present* Not Present Final       Allergies: No Known Allergies    Drug Interactions:   - venetoclax reduced to 100 mg daily due to DDI with posaconazole      Current Medications:  Current Outpatient Medications   Medication Sig Dispense Refill   ???  acetaminophen (TYLENOL) 500 MG tablet Take 500 mg by mouth every eight (8) hours as needed.     ??? allopurinoL (ZYLOPRIM) 300 MG tablet Take 150 mg by mouth daily.      ??? cyanocobalamin, vitamin B-12, (VITAMIN B-12) 1000 MCG tablet Take 1,000 mcg by mouth daily.     ??? finasteride (PROSCAR) 5 mg tablet Take 5 mg by mouth daily with evening meal.     ??? folic acid (FOLVITE) 1 MG tablet Take 1 mg by mouth daily.     ??? latanoprost (XALATAN) 0.005 % ophthalmic solution 1 drop nightly.     ??? posaconazole (NOXAFIL) 100 mg TbEC delayed released tablet Take 3 tablets (300 mg) by mouth daily. 90 tablet 1   ??? sodium bicarbonate 650 mg tablet Take 650 mg by mouth Two (2) times a day.     ??? timolol (TIMOPTIC) 0.5 % ophthalmic solution Apply 1 drop to eye daily.      ??? valACYclovir (VALTREX) 500 MG tablet Take 1 tablet (500 mg total) by mouth daily. 30 tablet 11   ??? venetoclax (VENCLEXTA) 100 mg tablet Take 1 tablet (100 mg total) by mouth daily. Take with a meal and water. Do not chew, crush, or break tablets. 30 tablet 1   ??? levoFLOXacin (LEVAQUIN) 250 MG tablet Take 1 tablet (250 mg total) by mouth daily. (Patient not taking: Reported on 04/23/2020) 30 tablet 1   ??? levoFLOXacin (LEVAQUIN) 500 MG tablet TAKE 1/2 TABLET BY MOUTH EVERY DAY     ??? tranexamic acid 650 mg Tab tablet        No current facility-administered medications for this visit.       Adherence:         Assessment: Howard Villarreal is a 74 y.o. 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    Plan:   - PRBC transfusion today  - Will start posaconazole 300 mg daily (will arrive tonight). Should dose reduce venetoclax to 100 mg once daily  - Since ANC is < 0.5 will start levofloxacin 250 mg daily (dose adjusted for renal fx). Continue valtrex daily  - Continue allopurinol 150 mg once daily reduced for renal fx (can likely stop after C1)  - Can reduce TXA to BID and then to prn. With increasing platelets I am concerned about clots. If he is not having nose bleeds he should avoid use of this. Followed by ENT.  - Has transfusions scheduled for this week, NP visit next week, and LP on 8/6, MD follow-up 8/9    F/u:  Future Appointments   Date Time Provider Department Center   04/27/2020  1:00 PM ADULT ONC LAB [facility]   04/27/2020  2:00 PM Bernerd Limbo, FNP [id number]   04/27/2020  3:00 PM ONCINF CHAIR 28 [id number]   04/30/2020  2:45 PM ADULT ONC LAB [facility]   04/30/2020  3:45 PM Brandi Acey Lav, MD [id number]   04/30/2020  4:30 PM ONCINF CHAIR 32 [id number]       I spent 30 minutes with Howard Villarreal in direct patient care.      Manfred Arch, PharmD, BCOP, CPP  Pager: [phone]

## 2020-04-25 NOTE — Unmapped (Signed)
Patients wife contacted the [facility] today stating that her husband is running a fever of 100.4 want to know what to do in this situation. She would like to know if he is able to take tylenol. Please contact patients wife at [phone] urgently.     Thank you,   Noland Fordyce  [facility]  [phone]

## 2020-04-25 NOTE — ED Provider Notes (Signed)
[facility] Emergency Department Provider Note   ____________________________________________   First MD Initiated Contact with Patient 04/25/20 [id number]     (approximate)  I have reviewed the triage vital signs and the nursing notes.   HISTORY  Chief Complaint Fever and Altered Mental Status    HPI Ruben [name] is a 74 y.o. male with past medical history of AML, polycythemia vera, Crohn's disease, CKD, and myelofibrosis who presents to the ED complaining of fever.  92 of history is obtained from patient's wife, who states that he has been dealing with chills for the past couple of days and when she took his temperature earlier today it was 101.1.  He has been feeling increasingly weak and fatigued, but denies any other symptoms.  He specifically has not had any cough, chest pain, shortness of breath, abdominal pain, nausea, vomiting, dysuria, or rash.  He currently follows with oncology at [facility], but is interested and transitioning care to oncology here at [facility].  His wife spoke with Dr. Mike Gip of oncology earlier today, who recommended he come to the ER for evaluation.        Past Medical History:  Diagnosis Date  . Blood dyscrasia   . BPH (benign prostatic hyperplasia)   . Cancer ([place])    SKIN/ POLYCYTHEMIA VERA  . Chronic kidney disease    RENAL INSUFF (40%)  . Crohn disease ([place])   . Crohn's disease ([place])   . Glaucoma   . Glaucoma   . Gout   . History of chicken pox   . Myelofibrosis ([place])   . Nosebleed   . RBBB   . Right bundle branch block     Patient Active Problem List   Diagnosis Date Noted  . Epistaxis, recurrent 04/01/2020  . Acute myeloid leukemia not having achieved remission ([place]) 03/07/2020  . Normocytic anemia 03/02/2020  . Weight loss 03/02/2020  . Pneumonia of both lungs due to infectious organism 03/01/2020  . Elevated total protein 12/04/2019  . Secondary hyperparathyroidism of renal origin ([place]) 09/20/2019  .  Chronic kidney disease, stage 3a 09/20/2019  . Foot drop, right 06/09/2019  . Crohn's disease of small intestine without complication ([place]) 00/34/9179  . Right-sided thoracic back pain 04/06/2019  . Hyponatremia 04/06/2019  . Iron deficiency anemia 11/26/2018  . Goals of care, counseling/discussion 01/06/2018  . Myeloproliferative neoplasm ([place]) 01/04/2018  . High triglycerides 10/14/2017  . 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 Renal insufficiency 07/23/2016  . 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 Right ankle pain 10/31/2015  . Hyperuricemia 09/05/2015  . BPH (benign prostatic hyperplasia) 08/23/2015  . History of tobacco use 08/23/2015  . Glaucoma 08/23/2015  . Enlarged prostate without lower urinary tract symptoms (luts) 08/23/2015  . Elevated prostate specific antigen (PSA) 08/23/2015  . Dermatitis 08/23/2015  . Crohn disease ([place]) 08/23/2015  . Other prurigo 08/23/2015  . Dorsalgia 08/23/2015  . Personal history of nicotine dependence 08/23/2015  . Polycythemia vera ([place]) 04/04/2015  . Secondary polycythemia 03/28/2015  . 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 Right bundle-branch block 05/03/2012    Past Surgical History:  Procedure Laterality Date  . CATARACT EXTRACTION W/PHACO Left 07/13/2018   Procedure: CATARACT EXTRACTION PHACO AND INTRAOCULAR LENS PLACEMENT (IOC);  Surgeon: [name], MD;  Location: [facility] ORS;  Service: Ophthalmology;  Laterality: Left;  [place] 00:39  CDE 5.14 Fluid pack lot # [id number]  . CATARACT EXTRACTION W/PHACO Right 08/03/2018   Procedure: CATARACT EXTRACTION PHACO AND  INTRAOCULAR LENS PLACEMENT (IOC);  Surgeon: [name], MD;  Location: [facility] ORS;  Service: Ophthalmology;  Laterality: Right;  [place] 00:43.4 CDE 5.23 Fluid pack Lot # [id number]  . COLONOSCOPY WITH PROPOFOL N/A 03/27/2017   Procedure: COLONOSCOPY WITH PROPOFOL;  Surgeon: [name], MD;  Location: [facility] ENDOSCOPY;  Service: Endoscopy;   Laterality: N/A;  . MOHS SURGERY     EAR  . PROSTATE SURGERY  2008   Prostate Biopsy in [place] to elevated PSA.  per patient normal  . Skin Lesion Basal cell removed    . wart removal     from eyelid    Prior to Admission medications   Medication Sig Start Date End Date Taking? Authorizing Provider  acetaminophen (TYLENOL) 500 MG tablet Take 500 mg by mouth daily as needed for moderate pain.    [provider]  albuterol (VENTOLIN HFA) 108 (90 Base) MCG/ACT inhaler Inhale 2 puffs into the lungs every 6 (six) hours as needed for wheezing or shortness of breath. 02/29/20   Jacquelin Hawking, NP  allopurinol (ZYLOPRIM) 300 MG tablet TAKE 1/2 TABLET (150 MG TOTAL) BY MOUTH DAILY. 12/05/19   Lequita Asal, MD  Cholecalciferol (VITAMIN D3) 1000 units CAPS Take 1,000 Units by mouth daily at 12 noon.     [provider]  clobetasol ointment (TEMOVATE) 0.05 %  01/09/20   [provider]  Cyanocobalamin (RA VITAMIN B-12 TR) 1000 MCG TBCR Take 1,000 mcg by mouth daily at 12 noon.     [provider]  Fedratinib HCl (INREBIC) 100 MG CAPS Take 200 mg by mouth daily.  01/02/20   [provider]  finasteride (PROSCAR) 5 MG tablet Take 1 tablet (5 mg total) by mouth every evening. 11/08/19   Hollice Espy, MD  folic acid (FOLVITE) 1 MG tablet Take 1 mg by mouth daily at 12 noon.  03/20/15   [provider]  gentamicin ointment (GARAMYCIN) 0.1 % SMARTSIG:1 Topical Every Night 03/20/20   [provider]  hydroxyurea (HYDREA) 500 MG capsule Take 1,000 mg by mouth 2 (two) times daily.  03/06/20   [provider]  latanoprost (XALATAN) 0.005 % ophthalmic solution Place 1 drop into both eyes at bedtime.     [provider]  Multiple Vitamins-Minerals (CENTRUM SILVER ULTRA MENS) TABS Take 1 tablet by mouth daily at 12 noon.     [provider]  Omega-3 Fatty Acids (KP FISH OIL) 1200 MG CAPS Take 1,200 mg by mouth daily.      [provider]  sodium bicarbonate 650 MG tablet Take 650 mg by mouth 2 (two) times daily. 06/06/17   [provider]  timolol (TIMOPTIC) 0.5 % ophthalmic solution Place 1 drop into both eyes daily. In AM 05/27/18   [provider]  triamcinolone cream (KENALOG) 0.1 % Apply topically 2 (two) times daily as needed. Patient not taking: Reported on 03/22/2020 11/03/19   [provider]  venetoclax 100 MG TABS Take by mouth.  03/06/20   [provider]    Allergies Patient has no known allergies.  Family History  Problem Relation Age of Onset  . Cancer Mother        Liver  . Cancer Father        Prostate  . Cancer Sister        Hodgkins lymphoma  . Cancer Paternal Aunt        Breast    Social History Social History   Tobacco Use  .  Smoking status: Former Smoker    Packs/day: 0.25    Years: 20.00    Pack years: 5.00    Types: Cigarettes    Quit date: 09/28/2003    Years since quitting: 16.5  . Smokeless tobacco: Never Used  Vaping Use  . Vaping Use: Never used  Substance Use Topics  . Alcohol use: Not Currently    Alcohol/week: 4.0 - 5.0 standard drinks    Types: 4 - 5 Glasses of wine per week  . Drug use: No    Review of Systems  Constitutional: Positive for fever/chills.  Positive for generalized weakness and fatigue. Eyes: No visual changes. ENT: No sore throat. Cardiovascular: Denies chest pain. Respiratory: Denies shortness of breath. Gastrointestinal: No abdominal pain.  No nausea, no vomiting.  No diarrhea.  No constipation. Genitourinary: Negative for dysuria. Musculoskeletal: Negative for back pain. Skin: Negative for rash. Neurological: Negative for headaches, focal weakness or numbness.  ____________________________________________   PHYSICAL EXAM:  VITAL SIGNS: ED Triage Vitals  Enc Vitals Group     BP 04/25/20 1822 (!) 147/63     Pulse Rate 04/25/20 1822 (!) 118     Resp 04/25/20 1822 (!) 24     Temp  04/25/20 1822 (!) 101.1 F (38.4 C)     Temp src --      SpO2 04/25/20 1822 100 %     Weight 04/25/20 1823 152 lb (68.9 kg)     Height 04/25/20 1823 6' (1.829 m)     Head Circumference --      Peak Flow --      Pain Score 04/25/20 1823 3     Pain Loc --      Pain Edu? --      Excl. in [place]? --     Constitutional: Alert and oriented. Eyes: Conjunctivae are normal. Head: Atraumatic. Nose: No congestion/rhinnorhea. Mouth/Throat: Mucous membranes are moist. Neck: Normal ROM Cardiovascular: Tachycardic, regular rhythm. Grossly normal heart sounds. Respiratory: Tachypneic with normal respiratory effort.  No retractions. Lungs CTAB. Gastrointestinal: Soft and nontender. No distention. Genitourinary: deferred Musculoskeletal: No lower extremity tenderness nor edema. Neurologic:  Normal speech and language. No gross focal neurologic deficits are appreciated. Skin:  Skin is warm, dry and intact. No rash noted. Psychiatric: Mood and affect are normal. Speech and behavior are normal.  ____________________________________________   LABS (all labs ordered are listed, but only abnormal results are displayed)  Labs Reviewed  COMPREHENSIVE METABOLIC PANEL - Abnormal; Notable for the following components:      Result Value   Sodium 127 (*)    Chloride 96 (*)    CO2 20 (*)    Glucose, Bld 118 (*)    BUN 34 (*)    Creatinine, Ser 1.84 (*)    Calcium 8.3 (*)    Total Protein 8.8 (*)    Albumin 3.0 (*)    AST 52 (*)    GFR calc non Af Amer 36 (*)    GFR calc Af Amer 41 (*)    All other components within normal limits  CBC WITH DIFFERENTIAL/PLATELET - Abnormal; Notable for the following components:   WBC 1.3 (*)    RBC 3.00 (*)    Hemoglobin 8.4 (*)    HCT 25.7 (*)    RDW 20.9 (*)    Platelets 482 (*)    nRBC 3.0 (*)    Neutro Abs 0.4 (*)    Lymphs Abs 0.5 (*)    Abs Immature Granulocytes 0.09 (*)  All other components within normal limits  PROTIME-INR - Abnormal; Notable for  the following components:   Prothrombin Time 15.9 (*)    INR 1.3 (*)    All other components within normal limits  URINALYSIS, COMPLETE (UACMP) WITH MICROSCOPIC - Abnormal; Notable for the following components:   Color, Urine YELLOW (*)    APPearance CLEAR (*)    Protein, ur 30 (*)    All other components within normal limits  CULTURE, BLOOD (ROUTINE X 2)  CULTURE, BLOOD (ROUTINE X 2)  URINE CULTURE  SARS CORONAVIRUS 2 BY RT PCR (HOSPITAL ORDER, [facility])  LACTIC ACID, PLASMA  LACTIC ACID, PLASMA  APTT  PROCALCITONIN  PROCALCITONIN  PATHOLOGIST SMEAR REVIEW   ____________________________________________  EKG  ED ECG REPORT I, Blake Divine, the attending physician, personally viewed and interpreted this ECG.   Date: 04/25/2020  EKG Time: 19:45  Rate: 112  Rhythm: sinus tachycardia  Axis: Normal  Intervals:right bundle branch block and left posterior fascicular block  ST&T Change: None   PROCEDURES  Procedure(s) performed (including Critical Care):  .Critical Care Performed by: Blake Divine, MD Authorized by: Blake Divine, MD   Critical care provider statement:    Critical care time (minutes):  45   Critical care time was exclusive of:  Separately billable procedures and treating other patients and teaching time   Critical care was necessary to treat or prevent imminent or life-threatening deterioration of the following conditions:  Sepsis   Critical care was time spent personally by me on the following activities:  Discussions with consultants, evaluation of patient's response to treatment, examination of patient, ordering and performing treatments and interventions, ordering and review of laboratory studies, ordering and review of radiographic studies, pulse oximetry, re-evaluation of patient's condition, obtaining history from patient or surrogate and review of old charts   I assumed direction of critical care for this patient  from another provider in my specialty: no       ____________________________________________   INITIAL IMPRESSION / ASSESSMENT AND PLAN / ED COURSE       74 year old male with past medical history of AML, polycythemia vera, myelofibrosis, Crohn's disease, and CKD who presents to the ED for chills for the past couple of days and developing a fever earlier today.  He reports fatigue and generalized weakness but denies any other symptoms.  He is at his baseline mental status with no focal neurologic deficits.  No obvious source of infection at this time but fever, tachycardia, and tachypnea are suggestive of sepsis and we will start IV fluid resuscitation as well as broad-spectrum antibiotics.  Patient's heart rate is gradually improving following IV fluid bolus, he remains normotensive, awake and alert.  Lab work shows neutropenia as well as AKI, chest x-ray shows bilateral multifocal pneumonia, potentially viral in etiology however patient has been vaccinated against COVID-19.  He continues to maintain O2 sats on room air.  Case discussed with Dr. Mike Gip of oncology, who recommends continuing his antiviral and antifungal prophylaxis, will follow during admission.  Case discussed with hospitalist for admission.      ____________________________________________   FINAL CLINICAL IMPRESSION(S) / ED DIAGNOSES  Final diagnoses:  Sepsis without acute organ dysfunction, due to unspecified organism [facility])  Neutropenic fever ([place])  Atypical pneumonia     ED Discharge Orders    None       Note:  This document was prepared using Dragon voice recognition software and may include unintentional dictation errors.  Blake Divine, MD 04/25/20 2105

## 2020-04-25 NOTE — ED Triage Notes (Signed)
First Nurse Note:  Patient has history of AML.  Currently being treated at [facility], Patient is Neutropenic 04/23/2020.  Patient running fever today 101-- Prophylactic antibiotics.  AAOx3.  Skin warm and dry. NAD

## 2020-04-25 NOTE — ED Notes (Signed)
This [facility] [employer] pharmacy regarding med verification.

## 2020-04-25 NOTE — ED Notes (Signed)
Date and time results received: 04/25/20 2000 (use smartphrase ".now" to insert current time)  Test: WBC Critical Value: 1.3  Name of Provider Notified: jessup  Orders Received? Or Actions Taken?: Actions Taken: edp notified

## 2020-04-25 NOTE — ED Notes (Signed)
MD Sherrye Payor. at bedside.

## 2020-04-25 NOTE — Progress Notes (Signed)
CODE SEPSIS - PHARMACY COMMUNICATION  **Broad Spectrum Antibiotics should be administered within 1 hour of Sepsis diagnosis**  Time Code Sepsis Called/Page Received: 1916  Antibiotics Ordered: vancomycin/cefepime/metronidazole  Time of 1st antibiotic administration:   Additional action taken by pharmacy: [employer] 04/25/2020  7:31 PM

## 2020-04-25 NOTE — ED Notes (Signed)
Pt self cath at home. Pt with spouse at beside assistance pt with cath brought form home.  Spouse prefers to use catheter from home.

## 2020-04-25 NOTE — Consult Note (Signed)
PHARMACY -  BRIEF ANTIBIOTIC NOTE   Pharmacy has received consult(s) for cefepime and vancomycin from an ED provider. Patient is also ordered metronidazole. The patient's profile has been reviewed for ht/wt/allergies/indication/available labs.    One time order(s) placed for  --Vancomycin 1 g  (already ordered) --Cefepime 2 g (already ordered)  Further antibiotics/pharmacy consults should be ordered by admitting physician if indicated.                       Thank you, Benita Gutter 04/25/2020  7:31 PM

## 2020-04-25 NOTE — ED Triage Notes (Signed)
Pt here via pov with c/o temperature swings. Pt currently has a fever of 101.1. after taking medication wife states pt temperature increases and confusion increases. Pt a&o x 4 at this time. Pt reports some SOB at times. NAD noted at this time

## 2020-04-25 NOTE — ED Notes (Signed)
Date and time results received: 04/25/20 2136 (use smartphrase ".now" to insert current time)  Test: LA Critical Value: 2.4  Name of Provider Notified: MD patel  Orders Received? Or Actions Taken?: Actions Taken: MD patel made aware

## 2020-04-25 NOTE — H&P (Signed)
History and Physical    DYAMI UMBACH CBS:[id number] DOB: June 29, 1946 DOA: 04/25/2020  PCP: [name], MD  Patient coming from: Home  I have personally briefly reviewed patient's old medical records in [place]  Chief Complaint: Fever  HPI: KEEAN WILMETH is a 74 y.o. 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  Patient has been recently following with [facility] oncology for management of his AML however is now planning to transition to oncology care here in [place].  He has had several days of increased lethargy, dry cough, hot and cold spells, and infrequent loose stools.  Today he developed a new fever at home.  His wife states that she has been in contact with oncology, Dr. Mike Gip here who recommended presenting to the ED for further evaluation.  Patient has been on prophylactic antibiotics, antiviral, and antifungal with Levaquin, Valtrex, and posaconazole respectively.  He denies any chest pain or dyspnea.  He denies any nausea, vomiting, abdominal pain.  He reports chronic urinary retention for which he has to self catheterize.  He has had decreased oral intake recently.  ED Course:  Initial vitals showed BP 147/63, pulse 114, RR 24, temp 101.1 Fahrenheit, SPO2 100% on room air.  Labs are notable for WBC 1.3, hemoglobin 8.4, platelets 482,000, ANC 400, lactic acid 1.9, sodium 127, potassium 3.8, bicarb 20, BUN 34, creatinine 1.84, AST 52, ALT 13, alk phos 89, total bilirubin 0.7, serum glucose 118, INR 1.3.  Urinalysis showed negative nitrates, negative leukocytes, 0-5 RBC/hpf, 0-5 WBC/hpf no bacteria microscopy.  Blood and urine cultures were obtained and pending.  SARS-CoV-2 PCR is negative.  Portable chest x-ray shows patchy bilateral pulmonary  infiltrates, right greater than left.  Patient was started on empiric antibiotics with IV vancomycin, cefepime, Flagyl.  Patient was given 2.25 L LR.  EDP discussed the case with on-call oncology who recommended continuing home antiviral and antifungal prophylaxis and will follow during admission.  The hospitalist service was consulted to admit for further evaluation management.  Review of Systems: All systems reviewed and are negative except as documented in history of present illness above.   Past Medical History:  Diagnosis Date  . Blood dyscrasia   . BPH (benign prostatic hyperplasia)   . Cancer ([place])    SKIN/ POLYCYTHEMIA VERA  . Chronic kidney disease    RENAL INSUFF (40%)  . Crohn disease ([place])   . Crohn's disease ([place])   . Glaucoma   . Glaucoma   . Gout   . History of chicken pox   . Myelofibrosis ([place])   . Nosebleed   . RBBB   . Right bundle branch block     Past Surgical History:  Procedure Laterality Date  . CATARACT EXTRACTION W/PHACO Left 07/13/2018   Procedure: CATARACT EXTRACTION PHACO AND INTRAOCULAR LENS PLACEMENT (IOC);  Surgeon: [name], MD;  Location: [facility] ORS;  Service: Ophthalmology;  Laterality: Left;  [place] 00:39  CDE 5.14 Fluid pack lot # [id number]  . CATARACT EXTRACTION W/PHACO Right 08/03/2018   Procedure: CATARACT EXTRACTION PHACO AND INTRAOCULAR LENS PLACEMENT (IOC);  Surgeon: [name], MD;  Location: [facility] ORS;  Service: Ophthalmology;  Laterality: Right;  [place] 00:43.4 CDE 5.23 Fluid pack Lot # [id number]  . COLONOSCOPY WITH PROPOFOL N/A 03/27/2017   Procedure: COLONOSCOPY WITH PROPOFOL;  Surgeon: [name], MD;  Location: [facility] ENDOSCOPY;  Service: Endoscopy;  Laterality: N/A;  . MOHS SURGERY     EAR  . PROSTATE SURGERY  2008   Prostate Biopsy in [place] to elevated PSA.  per patient normal  . Skin Lesion Basal cell removed    . wart removal     from eyelid    Social History:  reports that he quit smoking about 16  years ago. His smoking use included cigarettes. He has a 5.00 pack-year smoking history. He has never used smokeless tobacco. He reports previous alcohol use of about 4.0 - 5.0 standard drinks of alcohol per week. He reports that he does not use drugs.  No Known Allergies  Family History  Problem Relation Age of Onset  . Cancer Mother        Liver  . Cancer Father        Prostate  . Cancer Sister        Hodgkins lymphoma  . Cancer Paternal Aunt        Breast     Prior to Admission medications   Medication Sig Start Date End Date Taking? Authorizing Provider  acetaminophen (TYLENOL) 500 MG tablet Take 500 mg by mouth daily as needed for moderate pain.    [provider]  albuterol (VENTOLIN HFA) 108 (90 Base) MCG/ACT inhaler Inhale 2 puffs into the lungs every 6 (six) hours as needed for wheezing or shortness of breath. 02/29/20   Jacquelin Hawking, NP  allopurinol (ZYLOPRIM) 300 MG tablet TAKE 1/2 TABLET (150 MG TOTAL) BY MOUTH DAILY. 12/05/19   Lequita Asal, MD  Cholecalciferol (VITAMIN D3) 1000 units CAPS Take 1,000 Units by mouth daily at 12 noon.     [provider]  clobetasol ointment (TEMOVATE) 0.05 %  01/09/20   [provider]  Cyanocobalamin (RA VITAMIN B-12 TR) 1000 MCG TBCR Take 1,000 mcg by mouth daily at 12 noon.     [provider]  Fedratinib HCl (INREBIC) 100 MG CAPS Take 200 mg by mouth daily.  01/02/20   [provider]  finasteride (PROSCAR) 5 MG tablet Take 1 tablet (5 mg total) by mouth every evening. 11/08/19   Hollice Espy, MD  folic acid (FOLVITE) 1 MG tablet Take 1 mg by mouth daily at 12 noon.  03/20/15   [provider]  gentamicin ointment (GARAMYCIN) 0.1 % SMARTSIG:1 Topical Every Night 03/20/20   [provider]  hydroxyurea (HYDREA) 500 MG capsule Take 1,000 mg by mouth 2 (two) times daily.  03/06/20   [provider]  latanoprost (XALATAN) 0.005 % ophthalmic solution Place 1 drop  into both eyes at bedtime.     [provider]  Multiple Vitamins-Minerals (CENTRUM SILVER ULTRA MENS) TABS Take 1 tablet by mouth daily at 12 noon.     [provider]  Omega-3 Fatty Acids (KP FISH OIL) 1200 MG CAPS Take 1,200 mg by mouth daily.     [provider]  sodium bicarbonate 650 MG tablet Take 650 mg by mouth 2 (two) times daily. 06/06/17   [provider]  timolol (TIMOPTIC) 0.5 % ophthalmic solution Place 1 drop into both eyes daily. In AM 05/27/18   [provider]  triamcinolone cream (KENALOG) 0.1 % Apply topically 2 (two) times daily as needed. Patient not taking: Reported on 03/22/2020 11/03/19   [provider]  venetoclax 100 MG TABS Take by mouth.  03/06/20   [provider]  Physical Exam: Vitals:   04/25/20 1903 04/25/20 2021 04/25/20 2022 04/25/20 2035  BP: (!) 141/75  131/67   Pulse: (!) 115 (!) 110 (!) 111   Resp: (!) 32 (!) 28 (!) 27   Temp:    (!) 100.5 F (38.1 C)  TempSrc:    Oral  SpO2: 99% 99% 100%   Weight:      Height:       Constitutional: Resting supine in bed with head elevated, NAD, calm, comfortable Eyes: PERRL, lids and conjunctivae normal ENMT: Mucous membranes are dry. Posterior pharynx clear of any exudate or lesions.Normal dentition.  Neck: normal, supple, no masses. Respiratory: Bibasilar inspiratory crackles. Normal respiratory effort. No accessory muscle use.  Cardiovascular: Regular rate and rhythm.  2/6 systolic murmur present, most pronounced in tricuspid and mitral regions.  +1 bilateral lower extremity edema. 2+ pedal pulses. Abdomen: no tenderness, no masses palpated. No hepatosplenomegaly. Bowel sounds positive.  Musculoskeletal: no clubbing / cyanosis. No joint deformity upper and lower extremities. Good ROM, no contractures. Normal muscle tone.  Skin: no rashes, lesions, ulcers. No induration Neurologic: CN 2-12 grossly intact. Sensation intact, Strength 5/5 in all 4.    Psychiatric: Normal judgment and insight. Alert and oriented x 3. Normal mood.    Labs on Admission: I have personally reviewed following labs and imaging studies  CBC: Recent Labs  Lab 04/25/20 1909  WBC 1.3*  NEUTROABS 0.4*  HGB 8.4*  HCT 25.7*  MCV 85.7  PLT 428*   Basic Metabolic Panel: Recent Labs  Lab 04/25/20 1909  NA 127*  K 3.8  CL 96*  CO2 20*  GLUCOSE 118*  BUN 34*  CREATININE 1.84*  CALCIUM 8.3*   GFR: Estimated Creatinine Clearance: 34.8 mL/min (A) (by C-G formula based on SCr of 1.84 mg/dL (H)). Liver Function Tests: Recent Labs  Lab 04/25/20 1909  AST 52*  ALT 13  ALKPHOS 89  BILITOT 0.7  PROT 8.8*  ALBUMIN 3.0*   No results for input(s): LIPASE, AMYLASE in the last 168 hours. No results for input(s): AMMONIA in the last 168 hours. Coagulation Profile: Recent Labs  Lab 04/25/20 1909  INR 1.3*   Cardiac Enzymes: No results for input(s): CKTOTAL, CKMB, CKMBINDEX, TROPONINI in the last 168 hours. BNP (last 3 results) No results for input(s): PROBNP in the last 8760 hours. HbA1C: No results for input(s): HGBA1C in the last 72 hours. CBG: No results for input(s): GLUCAP in the last 168 hours. Lipid Profile: No results for input(s): CHOL, HDL, LDLCALC, TRIG, CHOLHDL, LDLDIRECT in the last 72 hours. Thyroid Function Tests: No results for input(s): TSH, T4TOTAL, FREET4, T3FREE, THYROIDAB in the last 72 hours. Anemia Panel: No results for input(s): VITAMINB12, FOLATE, FERRITIN, TIBC, IRON, RETICCTPCT in the last 72 hours. Urine analysis:    Component Value Date/Time   COLORURINE YELLOW (A) 04/25/2020 1941   APPEARANCEUR CLEAR (A) 04/25/2020 1941   APPEARANCEUR Hazy (A) 11/08/2019 0947   LABSPEC 1.015 04/25/2020 1941   LABSPEC 1.021 08/24/2012 1636   PHURINE 5.0 04/25/2020 1941   GLUCOSEU NEGATIVE 04/25/2020 1941   GLUCOSEU Negative 08/24/2012 1636   HGBUR NEGATIVE 04/25/2020 [place] NEGATIVE 04/25/2020 1941   BILIRUBINUR  Negative 11/08/2019 0947   BILIRUBINUR Negative 08/24/2012 1636   KETONESUR NEGATIVE 04/25/2020 1941   PROTEINUR 30 (A) 04/25/2020 1941   UROBILINOGEN negative 09/03/2015 1124   NITRITE NEGATIVE 04/25/2020 1941   LEUKOCYTESUR NEGATIVE 04/25/2020 1941   LEUKOCYTESUR Negative 08/24/2012 1636    Radiological  Exams on Admission: DG Chest Port 1 View  Result Date: 04/25/2020 CLINICAL DATA:  Fever short of breath EXAM: PORTABLE CHEST 1 VIEW COMPARISON:  02/29/2020 FINDINGS: Patchy bilateral somewhat peripheral pulmonary opacity right greater than left. No pleural effusion. Normal heart size. No pneumothorax. IMPRESSION: Patchy bilateral peripheral pulmonary infiltrates right greater than left concerning for bilateral pneumonia, possible atypical or viral pneumonia. Electronically Signed   By: Donavan Foil M.D.   On: 04/25/2020 19:26    EKG: Independently reviewed. Sinus tachycardia, RBBB and LP FB.  No prior for comparison.  Assessment/Plan Principal Problem:   Sepsis due to pneumonia [facility]) Active Problems:   Crohn's disease of small intestine without complication (HCC)   Hyponatremia   CKD (chronic kidney disease) stage 3, GFR 30-59 ml/min   Normocytic anemia   Acute myeloid leukemia not having achieved remission (HCC)   Febrile neutropenia (HCC)   Leukopenia due to antineoplastic chemotherapy [facility])  KANON NOVOSEL is a 74 y.o. 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  Severe sepsis suspected due to pneumonia Febrile neutropenia: Patient presenting with fever up to 101.1 Fahrenheit, tachycardia, and tachypnea.  Repeat lactic acid up to 2.4.  Chest x-ray with bilateral pulmonary infiltrates suggestive of possible atypical or viral pneumonia.  Urinalysis is  negative for UTI.  He has been started on broad-spectrum antibiotics. -Continue empiric IV vancomycin, cefepime, azithromycin -Follow blood cultures -SARS-CoV-2 PCR is negative -Continue home prophylactic antiviral Valtrex and antifungal posaconazole -Give additional 1 L LR bolus followed by continued maintenance IV fluids overnight -Obtain echocardiogram given notable murmur, no prior echocardiogram on file that I could find  Acute myeloid leukemia: Currently on chemotherapy with venetoclax and azacitidine.  Has been following with [facility] oncology but per patient/family are transitioning care to Dr. Mike Gip here in [place]. -Holding home oral venetoclax for now -Continue prophylactic antiviral and antifungal medications as above  Leukopenia/anemia: Likely chemotherapy associated.  Holding venetoclax as above.  Continue to monitor.  CKD stage III: Chronic appears stable relative to recent labs.  Resume sodium bicarb and continue monitor.  Hyponatremia: Suspect due to dehydration.  Continue IV fluids and repeat labs in the morning.  Crohn's disease: Chronic appears stable.  BPH/urinary retention: Continue finasteride and self-catheterization.  DVT prophylaxis: Subcutaneous heparin Code Status: DNI, confirmed with patient Family Communication: Discussed with patient's wife at bedside Disposition Plan: From home and likely discharge to home pending sepsis management and fever work-up Consults called: EDP discussed with on-call oncology Admission status:  Status is: Inpatient  Remains inpatient appropriate because:Ongoing diagnostic testing needed not appropriate for outpatient work up, IV treatments appropriate due to intensity of illness or inability to take PO and Inpatient level of care appropriate due to severity of illness   Dispo: The patient is from: Home              Anticipated d/c is to: Home              Anticipated d/c date is: 3 days pending further management of  sepsis and fever work-up.              Patient currently is not medically stable to d/c.   Zada Finders MD [facility]  If 7PM-7AM, please contact night-coverage [email or web address]  04/25/2020, 9:25 PM

## 2020-04-26 ENCOUNTER — Inpatient Hospital Stay: Payer: Medicare Other | Attending: Hematology and Oncology | Admitting: Hematology and Oncology

## 2020-04-26 ENCOUNTER — Encounter: Payer: Self-pay | Admitting: Internal Medicine

## 2020-04-26 ENCOUNTER — Inpatient Hospital Stay (HOSPITAL_COMMUNITY)
Admit: 2020-04-26 | Discharge: 2020-04-26 | Disposition: A | Discharge status: Home / Self Care | Payer: Medicare Other | Attending: Internal Medicine | Admitting: Internal Medicine

## 2020-04-26 ENCOUNTER — Inpatient Hospital Stay: Payer: Medicare Other

## 2020-04-26 DIAGNOSIS — Z87891 Personal history of nicotine dependence: Secondary | ICD-10-CM | POA: Insufficient documentation

## 2020-04-26 DIAGNOSIS — Z5111 Encounter for antineoplastic chemotherapy: Secondary | ICD-10-CM | POA: Insufficient documentation

## 2020-04-26 DIAGNOSIS — R000 Tachycardia, unspecified: Secondary | ICD-10-CM

## 2020-04-26 DIAGNOSIS — C9200 Acute myeloblastic leukemia, not having achieved remission: Secondary | ICD-10-CM

## 2020-04-26 DIAGNOSIS — T451X5A Adverse effect of antineoplastic and immunosuppressive drugs, initial encounter: Secondary | ICD-10-CM

## 2020-04-26 DIAGNOSIS — I351 Nonrheumatic aortic (valve) insufficiency: Secondary | ICD-10-CM

## 2020-04-26 DIAGNOSIS — I340 Nonrheumatic mitral (valve) insufficiency: Secondary | ICD-10-CM

## 2020-04-26 DIAGNOSIS — E876 Hypokalemia: Secondary | ICD-10-CM | POA: Insufficient documentation

## 2020-04-26 DIAGNOSIS — I350 Nonrheumatic aortic (valve) stenosis: Secondary | ICD-10-CM

## 2020-04-26 DIAGNOSIS — R918 Other nonspecific abnormal finding of lung field: Secondary | ICD-10-CM

## 2020-04-26 DIAGNOSIS — N2889 Other specified disorders of kidney and ureter: Secondary | ICD-10-CM | POA: Insufficient documentation

## 2020-04-26 DIAGNOSIS — J189 Pneumonia, unspecified organism: Secondary | ICD-10-CM

## 2020-04-26 DIAGNOSIS — J9601 Acute respiratory failure with hypoxia: Secondary | ICD-10-CM

## 2020-04-26 DIAGNOSIS — D701 Agranulocytosis secondary to cancer chemotherapy: Secondary | ICD-10-CM

## 2020-04-26 DIAGNOSIS — R040 Epistaxis: Secondary | ICD-10-CM | POA: Insufficient documentation

## 2020-04-26 DIAGNOSIS — D709 Neutropenia, unspecified: Secondary | ICD-10-CM

## 2020-04-26 DIAGNOSIS — R339 Retention of urine, unspecified: Secondary | ICD-10-CM | POA: Insufficient documentation

## 2020-04-26 DIAGNOSIS — A419 Sepsis, unspecified organism: Principal | ICD-10-CM

## 2020-04-26 DIAGNOSIS — Z79899 Other long term (current) drug therapy: Secondary | ICD-10-CM | POA: Insufficient documentation

## 2020-04-26 DIAGNOSIS — E871 Hypo-osmolality and hyponatremia: Secondary | ICD-10-CM

## 2020-04-26 DIAGNOSIS — R5081 Fever presenting with conditions classified elsewhere: Secondary | ICD-10-CM

## 2020-04-26 DIAGNOSIS — D649 Anemia, unspecified: Secondary | ICD-10-CM

## 2020-04-26 DIAGNOSIS — K5090 Crohn's disease, unspecified, without complications: Secondary | ICD-10-CM | POA: Insufficient documentation

## 2020-04-26 DIAGNOSIS — I4510 Unspecified right bundle-branch block: Secondary | ICD-10-CM | POA: Insufficient documentation

## 2020-04-26 DIAGNOSIS — N400 Enlarged prostate without lower urinary tract symptoms: Secondary | ICD-10-CM | POA: Insufficient documentation

## 2020-04-26 DIAGNOSIS — M109 Gout, unspecified: Secondary | ICD-10-CM | POA: Insufficient documentation

## 2020-04-26 LAB — ECHOCARDIOGRAM COMPLETE
AR max vel: 1.68 cm2
AV Area VTI: 1.82 cm2
AV Area mean vel: 1.71 cm2
AV Mean grad: 10 mmHg
AV Peak grad: 17.5 mmHg
Ao pk vel: 2.09 m/s
Area-P 1/2: 5.54 cm2
Height: 72 in
P 1/2 time: 550 msec
S' Lateral: 3.13 cm
Weight: 2638.4 oz

## 2020-04-26 LAB — BLOOD GAS, ARTERIAL
Acid-base deficit: 9.5 mmol/L — ABNORMAL HIGH (ref 0.0–2.0)
Bicarbonate: 15.5 mmol/L — ABNORMAL LOW (ref 20.0–28.0)
FIO2: 0.32
O2 Saturation: 86.6 %
Patient temperature: 37
pCO2 arterial: 30 mmHg — ABNORMAL LOW (ref 32.0–48.0)
pH, Arterial: 7.32 — ABNORMAL LOW (ref 7.350–7.450)
pO2, Arterial: 57 mmHg — ABNORMAL LOW (ref 83.0–108.0)

## 2020-04-26 LAB — CBC WITH DIFFERENTIAL/PLATELET
Abs Immature Granulocytes: 0.01 10*3/uL (ref 0.00–0.07)
Abs Immature Granulocytes: 0.02 10*3/uL (ref 0.00–0.07)
Basophils Absolute: 0 10*3/uL (ref 0.0–0.1)
Basophils Absolute: 0 10*3/uL (ref 0.0–0.1)
Basophils Relative: 0 %
Basophils Relative: 1 %
Eosinophils Absolute: 0 10*3/uL (ref 0.0–0.5)
Eosinophils Absolute: 0 10*3/uL (ref 0.0–0.5)
Eosinophils Relative: 0 %
Eosinophils Relative: 0 %
HCT: 21 % — ABNORMAL LOW (ref 39.0–52.0)
HCT: 28 % — ABNORMAL LOW (ref 39.0–52.0)
Hemoglobin: 6.9 g/dL — ABNORMAL LOW (ref 13.0–17.0)
Hemoglobin: 9.1 g/dL — ABNORMAL LOW (ref 13.0–17.0)
Immature Granulocytes: 1 %
Immature Granulocytes: 1 %
Lymphocytes Relative: 38 %
Lymphocytes Relative: 57 %
Lymphs Abs: 0.4 10*3/uL — ABNORMAL LOW (ref 0.7–4.0)
Lymphs Abs: 1.5 10*3/uL (ref 0.7–4.0)
MCH: 28.2 pg (ref 26.0–34.0)
MCH: 28.4 pg (ref 26.0–34.0)
MCHC: 32.9 g/dL (ref 30.0–36.0)
MCHC: 32.5 g/dL (ref 30.0–36.0)
MCV: 85.7 fL (ref 80.0–100.0)
MCV: 87.5 fL (ref 80.0–100.0)
Monocytes Absolute: 0.3 10*3/uL (ref 0.1–1.0)
Monocytes Absolute: 0.3 10*3/uL (ref 0.1–1.0)
Monocytes Relative: 29 %
Monocytes Relative: 10 %
Neutro Abs: 0.3 10*3/uL — ABNORMAL LOW (ref 1.7–7.7)
Neutro Abs: 0.8 10*3/uL — ABNORMAL LOW (ref 1.7–7.7)
Neutrophils Relative %: 32 %
Neutrophils Relative %: 31 %
Platelets: 347 10*3/uL (ref 150–400)
Platelets: 526 10*3/uL — ABNORMAL HIGH (ref 150–400)
RBC: 2.45 MIL/uL — ABNORMAL LOW (ref 4.22–5.81)
RBC: 3.2 MIL/uL — ABNORMAL LOW (ref 4.22–5.81)
RDW: 20.3 % — ABNORMAL HIGH (ref 11.5–15.5)
RDW: 19.9 % — ABNORMAL HIGH (ref 11.5–15.5)
Smear Review: NORMAL
WBC: 1 10*3/uL — CL (ref 4.0–10.5)
WBC: 2.6 10*3/uL — ABNORMAL LOW (ref 4.0–10.5)
nRBC: 2 % — ABNORMAL HIGH (ref 0.0–0.2)
nRBC: 7.7 % — ABNORMAL HIGH (ref 0.0–0.2)

## 2020-04-26 LAB — URINE CULTURE: Culture: NO GROWTH

## 2020-04-26 LAB — BASIC METABOLIC PANEL
Anion gap: 8 (ref 5–15)
Anion gap: 12 (ref 5–15)
BUN: 31 mg/dL — ABNORMAL HIGH (ref 8–23)
BUN: 37 mg/dL — ABNORMAL HIGH (ref 8–23)
CO2: 22 mmol/L (ref 22–32)
CO2: 17 mmol/L — ABNORMAL LOW (ref 22–32)
Calcium: 8.2 mg/dL — ABNORMAL LOW (ref 8.9–10.3)
Calcium: 7.9 mg/dL — ABNORMAL LOW (ref 8.9–10.3)
Chloride: 101 mmol/L (ref 98–111)
Chloride: 101 mmol/L (ref 98–111)
Creatinine, Ser: 1.6 mg/dL — ABNORMAL HIGH (ref 0.61–1.24)
Creatinine, Ser: 1.8 mg/dL — ABNORMAL HIGH (ref 0.61–1.24)
GFR calc Af Amer: 49 mL/min — ABNORMAL LOW (ref >60)
GFR calc Af Amer: 42 mL/min — ABNORMAL LOW (ref >60)
GFR calc non Af Amer: 42 mL/min — ABNORMAL LOW (ref >60)
GFR calc non Af Amer: 37 mL/min — ABNORMAL LOW (ref >60)
Glucose, Bld: 93 mg/dL (ref 70–99)
Glucose, Bld: 189 mg/dL — ABNORMAL HIGH (ref 70–99)
Potassium: 3.4 mmol/L — ABNORMAL LOW (ref 3.5–5.1)
Potassium: 4 mmol/L (ref 3.5–5.1)
Sodium: 131 mmol/L — ABNORMAL LOW (ref 135–145)
Sodium: 130 mmol/L — ABNORMAL LOW (ref 135–145)

## 2020-04-26 LAB — PROTIME-INR
INR: 1.5 — ABNORMAL HIGH (ref 0.8–1.2)
Prothrombin Time: 17.7 seconds — ABNORMAL HIGH (ref 11.4–15.2)

## 2020-04-26 LAB — PREPARE RBC (CROSSMATCH)

## 2020-04-26 LAB — LACTIC ACID, PLASMA
Lactic Acid, Venous: 1.1 mmol/L (ref 0.5–1.9)
Lactic Acid, Venous: 4.3 mmol/L (ref 0.5–1.9)

## 2020-04-26 LAB — TROPONIN I (HIGH SENSITIVITY): Troponin I (High Sensitivity): 80 ng/L — ABNORMAL HIGH (ref <18)

## 2020-04-26 LAB — ABO/RH: ABO/RH(D): O POS

## 2020-04-26 LAB — GLUCOSE, CAPILLARY: Glucose-Capillary: 179 mg/dL — ABNORMAL HIGH (ref 70–99)

## 2020-04-26 LAB — PHOSPHORUS: Phosphorus: 4.2 mg/dL (ref 2.5–4.6)

## 2020-04-26 LAB — PATHOLOGIST SMEAR REVIEW

## 2020-04-26 LAB — MRSA PCR SCREENING: MRSA by PCR: NEGATIVE

## 2020-04-26 LAB — MAGNESIUM: Magnesium: 1.6 mg/dL — ABNORMAL LOW (ref 1.7–2.4)

## 2020-04-26 LAB — PROCALCITONIN: Procalcitonin: 5.06 ng/mL

## 2020-04-26 MED ORDER — FUROSEMIDE 10 MG/ML IJ SOLN
40.0000 mg | Freq: Once | INTRAMUSCULAR | Status: AC
  Administered 2020-04-26: 40 mg via INTRAVENOUS

## 2020-04-26 MED ORDER — MORPHINE SULFATE (PF) 2 MG/ML IV SOLN
INTRAVENOUS | Status: AC
  Administered 2020-04-26: 1 mg via INTRAVENOUS
  Filled 2020-04-26: qty 1

## 2020-04-26 MED ORDER — SODIUM BICARBONATE 8.4 % IV SOLN
50.0000 meq | Freq: Once | INTRAVENOUS | Status: AC
  Administered 2020-04-26: 50 meq via INTRAVENOUS
  Filled 2020-04-26: qty 50

## 2020-04-26 MED ORDER — FUROSEMIDE 10 MG/ML IJ SOLN
INTRAMUSCULAR | Status: AC
  Filled 2020-04-26: qty 4

## 2020-04-26 MED ORDER — VANCOMYCIN HCL IN DEXTROSE 1-5 GM/200ML-% IV SOLN
1000.0000 mg | INTRAVENOUS | Status: DC
  Administered 2020-04-26 – 2020-04-28 (×3): 1000 mg via INTRAVENOUS
  Filled 2020-04-26 (×5): qty 200

## 2020-04-26 MED ORDER — LORAZEPAM 2 MG/ML IJ SOLN
INTRAMUSCULAR | Status: AC
  Administered 2020-04-26: 1 mg via INTRAVENOUS
  Filled 2020-04-26: qty 1

## 2020-04-26 MED ORDER — LORAZEPAM 2 MG/ML IJ SOLN: 1.0000 mg | Freq: Once | INTRAMUSCULAR | Status: AC

## 2020-04-26 MED ORDER — SODIUM CHLORIDE 0.9 % IV SOLN
INTRAVENOUS | Status: DC | PRN
  Administered 2020-04-26 – 2020-04-29 (×3): 250 mL via INTRAVENOUS

## 2020-04-26 MED ORDER — MORPHINE SULFATE (PF) 2 MG/ML IV SOLN
1.0000 mg | INTRAVENOUS | Status: AC
  Administered 2020-04-26: 1 mg via INTRAVENOUS

## 2020-04-26 MED ORDER — POTASSIUM CHLORIDE CRYS ER 20 MEQ PO TBCR
40.0000 meq | EXTENDED_RELEASE_TABLET | Freq: Once | ORAL | Status: AC
  Administered 2020-04-26: 40 meq via ORAL
  Filled 2020-04-26: qty 2

## 2020-04-26 MED ORDER — CHLORHEXIDINE GLUCONATE CLOTH 2 % EX PADS
6.0000 | MEDICATED_PAD | Freq: Every day | CUTANEOUS | Status: DC
  Administered 2020-04-26: 6 via TOPICAL

## 2020-04-26 MED ORDER — MORPHINE SULFATE (PF) 2 MG/ML IV SOLN: 1.0000 mg | INTRAVENOUS | Status: AC

## 2020-04-26 MED ORDER — SODIUM CHLORIDE 0.9% IV SOLUTION: Freq: Once | INTRAVENOUS | Status: AC

## 2020-04-26 NOTE — Unmapped (Signed)
Hi Courtney    Should we cancel his 8/9 appts as well?    Almira Coaster

## 2020-04-26 NOTE — Unmapped (Signed)
[name] contacted the Communication Center requesting to speak with the care team of JUQUAN REZNICK to discuss:    Patients spouse called to reschedule tomorrows appointment 04/27/2020 due to patient being inpatient at local hospital.    Please contact [name] at [phone].        Check Indicates criteria has been reviewed and confirmed with the patient:    [x]  Preferred Name   [x]  DOB and/or MR#  [x]  Preferred Contact Method  [x]  Phone Number(s)   []  MyChart     Thank you,   Jacques Navy  [facility]   [phone]

## 2020-04-26 NOTE — Unmapped (Signed)
reeves

## 2020-04-26 NOTE — Progress Notes (Signed)
Pharmacy Antibiotic Note  Ruben [name] is a 74 y.o. male admitted on 04/25/2020 with pneumonia.  Pharmacy has been consulted for Cefepime and Vancomycin dosing.  Plan: Cefepime 2gm IV q12hrs  Vancomycin 1000 mg IV Q 24 hrs. Goal AUC 400-550. Expected AUC: 545.2, Css min 14.6 SCr used: 1.84 Pt did not get a full loading dose, will start next dose early  Height: 6' (182.9 cm) Weight: 68.9 kg (152 lb) IBW/kg (Calculated) : 77.6  Temp (24hrs), Avg:99.8 F (37.7 C), Min:98.2 F (36.8 C), Max:101.1 F (38.4 C)  Recent Labs  Lab 04/25/20 1909 04/25/20 2057  WBC 1.3*  --   CREATININE 1.84*  --   LATICACIDVEN 1.9 2.4*    Estimated Creatinine Clearance: 34.8 mL/min (A) (by C-G formula based on SCr of 1.84 mg/dL (H)).    No Known Allergies  Antimicrobials this admission:   >>    >>   Dose adjustments this admission:   Microbiology results:  BCx:   UCx:    Sputum:    MRSA PCR:   Thank you for allowing pharmacy to be a part of this patient's care.  Hart Robinsons A 04/26/2020 3:42 AM

## 2020-04-26 NOTE — Progress Notes (Signed)
*  PRELIMINARY RESULTS* Echocardiogram 2D Echocardiogram has been performed.  Ruben [name] 04/26/2020, 11:13 AM

## 2020-04-26 NOTE — Consult Note (Signed)
[facility]  Date of admission:  04/25/2020  Inpatient day:  04/26/2020  Consulting physician:  Dr Jennye Boroughs   Reason for Consultation:  Sepsis and neutropenic fever  Chief Complaint: Ruben [name] is a 74 y.o. 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   HPI:  He was diagnosed with polycythemia in 2013. 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 He was initially on a phlebotomyprogram.  He has had serial bone marrows.  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 Bone marrowon 07/14/2019 revealed a hypercellular marrow (>95%) with persistent involvement by a myeloid neoplasm with extensive fibrosis and rare (< 5%) CD34 +blasts.   He was briefly on hydroxyureabeginning in 11/2015 then [facility].  Peripheral blood revealed increasing blasts.  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  Decision was made to pursue treatment with azacitidine and venetoclax.  He began hydroxyurea to decrease his WBC.  The patient was admitted to [facility] from 03/28/2020 - 04/04/2020 for cycle #1 azacitidine and venetoclax.  Day #1 was 04/01/2020. He tolerated treatment well, with the exception of mild tumor lysis syndrome, diarrhea, epistaxis x 1, and mild hyponatremia. He completed a 5 day course of ceftriaxone and azithromycin for pneumonia.  Chest CT on 03/29/2020 revealed multifocal right upper lobe airspace opacities, likely pneumonia. There were small bilateral pleural effusions, splenomegaly and right hydronephrosis.  CXR on 04/23/2020 revealed markedly improved to nearly resolved bilateral patchy airspace opacities.  There was persistent right mid and peripheral lung zone opacity.  There was no pleural effusion.   He presented with  fatigue, cough, hold and cold spells and diarrhea.  He has colitis.  Stool has alternated between diarrhea and constipation at times.  He denied any urinary symptoms.  He undergoes self-catheterization for chronic urinary retention.  Labs revealed a WBC 1300 with an ANC 400.  Hemoglobin was 8.4 ans platelets 482,000.  Urinalysis was negative.  COVID-19 testing was negative.  Chest x-ray on 04/26/2020 revealed interval development of vascular congestion and moderate diffuse bilateral interstitial opacities suspicious for pulmonary edema.  There are more focal areas of airspace disease within the peripheral right> left lung suspicious for pneumonia   he was on prophylactic antibiotics including Levaquin, valacyclovir and posaconazole.  Cefepime, vancomycin, and Flagyl were added.  He remains on cefepime and vancomycin.  Labs have been followed: 03/28/2020: Hematocrit 35.8, hemoglobin 10.9, MCV 92.7, platelets 152,000, WBC 43,500. 04/01/2020: Hematocrit 35.1, hemoglobin 10.6, MCV 92.2, platelets   94,000, WBC 23,500. 04/04/2020: Hematocrit 34.4, hemoglobin 10.4, MCV 91.1, platelets   34,000, WBC   5,600. 04/18/2020: Hematocrit 23.8, hemoglobin   7.7, MCV 88.7, platelets   93,000, WBC   1,500 (ANC 300). 04/20/2020: Hematocrit 26.1, hemoglobin   8.8, MCV 85.7, platelets 236,000, WBC   1,500 (ANC 400). 04/23/2020: Hematocrit 29.7, hemoglobin   9.6, MCV 88.7, platelets 516,000, WBC   1,700 (ANC 500). 04/25/2020: Hematocrit 25.7, hemoglobin   8.4, MCV 85.7, platelets 480,000, WBC   1,300 (ANC 400). 04/26/2020: Hematocrit 21.0, hemoglobin   6.9, MCV 85.7, platelets 347,000, WBC   1,000 (ANC 300).  He received 1 unit of PRBCs.  Symptomatically, he feels about the same.  He notes "temperature instability".  He denies any worsening cough or shortness of breath.  He denies any urinary symptoms.  Bowels remain problematic  with periodic diarrhea.  He denies any melena or hematochezia.   He received  bothCOVID-19vaccinesin 10/2019 (first on 10/28/2019).    Past Medical History:  Diagnosis Date  . Blood dyscrasia   . BPH (benign prostatic hyperplasia)   . Cancer ([place])    SKIN/ POLYCYTHEMIA VERA  . Chronic kidney disease    RENAL INSUFF (40%)  . Crohn disease ([place])   . Crohn's disease ([place])   . Glaucoma   . Glaucoma   . Gout   . History of chicken pox   . Myelofibrosis ([place])   . Nosebleed   . RBBB   . Right bundle branch block     Past Surgical History:  Procedure Laterality Date  . CATARACT EXTRACTION W/PHACO Left 07/13/2018   Procedure: CATARACT EXTRACTION PHACO AND INTRAOCULAR LENS PLACEMENT (IOC);  Surgeon: [name], MD;  Location: [facility] ORS;  Service: Ophthalmology;  Laterality: Left;  [place] 00:39  CDE 5.14 Fluid pack lot # [id number]  . CATARACT EXTRACTION W/PHACO Right 08/03/2018   Procedure: CATARACT EXTRACTION PHACO AND INTRAOCULAR LENS PLACEMENT (IOC);  Surgeon: [name], MD;  Location: [facility] ORS;  Service: Ophthalmology;  Laterality: Right;  [place] 00:43.4 CDE 5.23 Fluid pack Lot # [id number]  . COLONOSCOPY WITH PROPOFOL N/A 03/27/2017   Procedure: COLONOSCOPY WITH PROPOFOL;  Surgeon: [name], MD;  Location: [facility] ENDOSCOPY;  Service: Endoscopy;  Laterality: N/A;  . MOHS SURGERY     EAR  . PROSTATE SURGERY  2008   Prostate Biopsy in [place] to elevated PSA.  per patient normal  . Skin Lesion Basal cell removed    . wart removal     from eyelid    Family History  Problem Relation Age of Onset  . Cancer Mother        Liver  . Cancer Father        Prostate  . Cancer Sister        Hodgkins lymphoma  . Cancer Paternal Aunt        Breast    Social History:  reports that he quit smoking about 16 years ago. His smoking use included cigarettes. He has a 5.00 pack-year smoking history. He has never used smokeless tobacco. He reports previous alcohol use of about 4.0 - 5.0 standard drinks of alcohol per week. He reports that he does not  use drugs.  The patient denies any exposure to radiation or toxins.  The patient lives in [place].  He/she is accompanied by his wife, [name].  Allergies: No Known Allergies  Medications Prior to Admission  Medication Sig Dispense Refill  . acetaminophen (TYLENOL) 500 MG tablet Take 500 mg by mouth daily as needed for moderate pain.    Marland Kitchen allopurinol (ZYLOPRIM) 300 MG tablet TAKE 1/2 TABLET (150 MG TOTAL) BY MOUTH DAILY. 45 tablet 1  . Cyanocobalamin (RA VITAMIN B-12 TR) 1000 MCG TBCR Take 1,000 mcg by mouth daily at 12 noon.     . finasteride (PROSCAR) 5 MG tablet Take 1 tablet (5 mg total) by mouth every evening. 90 tablet 3  . folic acid (FOLVITE) 1 MG tablet Take 1 mg by mouth daily at 12 noon.     . latanoprost (XALATAN) 0.005 % ophthalmic solution Place 1 drop into both eyes at bedtime.     Marland Kitchen levofloxacin (LEVAQUIN) 250 MG tablet Take 250 mg by mouth daily.    Marland Kitchen POSACONAZOLE PO Take 300 mg by mouth daily.    . sodium bicarbonate 650 MG tablet Take 650  mg by mouth 2 (two) times daily.    . timolol (TIMOPTIC) 0.5 % ophthalmic solution Place 1 drop into both eyes daily. In AM  5  . tranexamic acid (LYSTEDA) 650 MG TABS tablet Take 650 mg by mouth 2 (two) times daily.    . valACYclovir (VALTREX) 500 MG tablet Take 500 mg by mouth daily.    Marland Kitchen venetoclax 100 MG TABS Take 100 mg by mouth daily.     Marland Kitchen albuterol (VENTOLIN HFA) 108 (90 Base) MCG/ACT inhaler Inhale 2 puffs into the lungs every 6 (six) hours as needed for wheezing or shortness of breath. (Patient not taking: Reported on 04/25/2020) 18 g 0    Review of Systems: GENERAL:  Fatigue.  .  Fever and chills.  No weight loss. PERFORMANCE STATUS (ECOG):  1-2 HEENT:  No visual changes, runny nose, sore throat, mouth sores or tenderness. Lungs:  Chronic cough.  No sputum production or hemoptysis. Cardiac:  No chest pain, palpitations, orthopnea, or PND. GI:  Diarrhea.  No nausea, vomiting, constipation, melena or hematochezia. GU:  Self  catheterizes.  No urgency, frequency, dysuria, or hematuria. Musculoskeletal:  No back pain.  No joint pain.  No muscle tenderness. Extremities:  No pain or swelling. Skin:  No rashes or skin changes. Neuro:  No headache, numbness or weakness, balance or coordination issues. Endocrine:  No diabetes, thyroid issues, hot flashes or night sweats. Psych:  No mood changes, depression or anxiety. Pain:  No focal pain. Review of systems:  All other systems reviewed and found to be negative.  Physical Exam:  Blood pressure (!) 142/75, pulse (!) 140, temperature 100.2 F (37.9 C), temperature source Oral, resp. rate (!) 28, height 6' (1.829 m), weight 164 lb 14.4 oz (74.8 kg), SpO2 100 %.  GENERAL:  Fatigued appealing gentleman sitting comfortably on the medical unit in no acute distress. MENTAL STATUS:  Alert and oriented to person, place and time. HEAD:  Short hair.  Normocephalic, atraumatic, face symmetric, no Cushingoid features. EYES:  Glasses.  Blue eyes.  Pupils equal round and reactive to light and accomodation.  No conjunctivitis or scleral icterus. ENT:  Oropharynx clear without lesion.  Tongue normal. Mucous membranes moist.  RESPIRATORY:  Clear to auscultation except for dry crackles at bases bilaterally. No wheezes. CARDIOVASCULAR:  Regular rate and rhythm with high pitched murmur at the left lower sternal border.  ABDOMEN:  Soft, non-tender, with active bowel sounds, and no hepatosplenomegaly.  No masses. SKIN:  No rashes, ulcers or lesions. EXTREMITIES: Lower extremity edema.  No skin discoloration or tenderness.  No palpable cords. LYMPH NODES: No palpable cervical, supraclavicular, axillary or inguinal adenopathy  NEUROLOGICAL: Unremarkable. PSYCH:  Appropriate.   Results for orders placed or performed during the hospital encounter of 04/25/20 (from the past 48 hour(s))  Comprehensive metabolic panel     Status: Abnormal   Collection Time: 04/25/20  7:09 PM  Result Value Ref  Range   Sodium 127 (L) 135 - 145 mmol/L   Potassium 3.8 3.5 - 5.1 mmol/L   Chloride 96 (L) 98 - 111 mmol/L   CO2 20 (L) 22 - 32 mmol/L   Glucose, Bld 118 (H) 70 - 99 mg/dL    Comment: Glucose reference range applies only to samples taken after fasting for at least 8 hours.   BUN 34 (H) 8 - 23 mg/dL   Creatinine, Ser 1.84 (H) 0.61 - 1.24 mg/dL   Calcium 8.3 (L) 8.9 - 10.3 mg/dL   Total Protein  8.8 (H) 6.5 - 8.1 g/dL   Albumin 3.0 (L) 3.5 - 5.0 g/dL   AST 52 (H) 15 - 41 U/L   ALT 13 0 - 44 U/L   Alkaline Phosphatase 89 38 - 126 U/L   Total Bilirubin 0.7 0.3 - 1.2 mg/dL   GFR calc non Af Amer 36 (L) >60 mL/min   GFR calc Af Amer 41 (L) >60 mL/min   Anion gap 11 5 - 15    Comment: Performed at [facility], [address] [place], [place] [address]  Lactic acid, plasma     Status: None   Collection Time: 04/25/20  7:09 PM  Result Value Ref Range   Lactic Acid, Venous 1.9 0.5 - 1.9 mmol/L    Comment: Performed at [facility], [address] [place], [place] [address]  CBC with Differential     Status: Abnormal   Collection Time: 04/25/20  7:09 PM  Result Value Ref Range   WBC 1.3 (LL) 4.0 - 10.5 K/uL    Comment: This critical result has verified and been called to [place] by Janeece Agee on 08 04 2021 at [place], and has been read back.    RBC 3.00 (L) 4.22 - 5.81 MIL/uL   Hemoglobin 8.4 (L) 13.0 - 17.0 g/dL   HCT 25.7 (L) 39 - 52 %   MCV 85.7 80.0 - 100.0 fL   MCH 28.0 26.0 - 34.0 pg   MCHC 32.7 30.0 - 36.0 g/dL   RDW 20.9 (H) 11.5 - 15.5 %   Platelets 482 (H) 150 - 400 K/uL   nRBC 3.0 (H) 0.0 - 0.2 %   Neutrophils Relative % 29 %   Neutro Abs 0.4 (L) 1.7 - 7.7 K/uL   Lymphocytes Relative 38 %   Lymphs Abs 0.5 (L) 0.7 - 4.0 K/uL   Monocytes Relative 26 %   Monocytes Absolute 0.3 0 - 1 K/uL   Eosinophils Relative 0 %   Eosinophils Absolute 0.0 0 - 0 K/uL   Basophils Relative 0 %   Basophils Absolute 0.0 0 - 0 K/uL   WBC Morphology MORPHOLOGY  UNREMARKABLE    RBC Morphology MORPHOLOGY UNREMARKABLE    Smear Review Normal platelet morphology    Immature Granulocytes 7 %   Abs Immature Granulocytes 0.09 (H) 0.00 - 0.07 K/uL    Comment: Performed at [facility], [address] [place], [place] [address]  Protime-INR     Status: Abnormal   Collection Time: 04/25/20  7:09 PM  Result Value Ref Range   Prothrombin Time 15.9 (H) 11.4 - 15.2 seconds   INR 1.3 (H) 0.8 - 1.2    Comment: (NOTE) INR goal varies based on device and disease states. Performed at [facility], [address] [place], [place] [address]   Culture, blood (Routine x 2)     Status: None (Preliminary result)   Collection Time: 04/25/20  7:09 PM   Specimen: BLOOD  Result Value Ref Range   Specimen Description BLOOD BLOOD LEFT FOREARM    Special Requests      BOTTLES DRAWN AEROBIC AND ANAEROBIC Blood Culture adequate volume   Culture      NO GROWTH < 12 HOURS Performed at [facility], [address], [place], [place] [address]    Report Status PENDING   Pathologist smear review     Status: None   Collection Time: 04/25/20  7:09 PM  Result Value Ref Range   Path  Review Blood smear is reviewed.     Comment: Patient with known history of polycythemia vera with progression to acute myeloid leukemia. Currently on chemotherapy. Admitted for pneumonia and sepsis. Leukopenia with occasional blasts. Normocytic anemia with nucleated RBCs. Thrombocytosis with variation in platelet size. Findings are consistent with current treatment for acute leukemia. Reviewed by Dellia Nims Reuel Derby, M.D. Performed at [facility], [address] [place], [place] [address]   Culture, blood (Routine x 2)     Status: None (Preliminary result)   Collection Time: 04/25/20  7:12 PM   Specimen: BLOOD  Result Value Ref Range   Specimen Description BLOOD BLOOD RIGHT FOREARM    Special Requests      BOTTLES DRAWN AEROBIC AND ANAEROBIC Blood  Culture adequate volume   Culture      NO GROWTH < 12 HOURS Performed at [facility], [address], [place], [place] [address]    Report Status PENDING   Urinalysis, Complete w Microscopic Nasopharyngeal Swab     Status: Abnormal   Collection Time: 04/25/20  7:41 PM  Result Value Ref Range   Color, Urine YELLOW (A) YELLOW   APPearance CLEAR (A) CLEAR   Specific Gravity, Urine 1.015 1.005 - 1.030   pH 5.0 5.0 - 8.0   Glucose, UA NEGATIVE NEGATIVE mg/dL   Hgb urine dipstick NEGATIVE NEGATIVE   Bilirubin Urine NEGATIVE NEGATIVE   Ketones, ur NEGATIVE NEGATIVE mg/dL   Protein, ur 30 (A) NEGATIVE mg/dL   Nitrite NEGATIVE NEGATIVE   Leukocytes,Ua NEGATIVE NEGATIVE   RBC / HPF 0-5 0 - 5 RBC/hpf   WBC, UA 0-5 0 - 5 WBC/hpf   Bacteria, UA NONE SEEN NONE SEEN   Squamous Epithelial / LPF 0-5 0 - 5   Hyaline Casts, UA PRESENT     Comment: Performed at [facility], [address] [place], [place] [address]  SARS Coronavirus 2 by RT PCR (hospital order, performed in [place] hospital lab) Nasopharyngeal Nasopharyngeal Swab     Status: None   Collection Time: 04/25/20  8:12 PM   Specimen: Nasopharyngeal Swab  Result Value Ref Range   SARS Coronavirus 2 NEGATIVE NEGATIVE    Comment: (NOTE) SARS-CoV-2 target nucleic acids are NOT DETECTED.  The SARS-CoV-2 RNA is generally detectable in upper and lower respiratory specimens during the acute phase of infection. The lowest concentration of SARS-CoV-2 viral copies this assay can detect is 250 copies / mL. A negative result does not preclude SARS-CoV-2 infection and should not be used as the sole basis for treatment or other patient management decisions.  A negative result may occur with improper specimen collection / handling, submission of specimen other than nasopharyngeal swab, presence of viral mutation(s) within the areas targeted by this assay, and inadequate number of viral copies (<250 copies / mL). A  negative result must be combined with clinical observations, patient history, and epidemiological information.  Fact Sheet for Patients:   StrictlyIdeas.no  Fact Sheet for Healthcare Providers: BankingDealers.co.za  This test is not yet approved or  cleared by the [place] FDA and has been authorized for detection and/or diagnosis of SARS-CoV-2 by FDA under an Emergency Use Authorization (EUA).  This EUA will remain in effect (meaning this test can be used) for the duration of the COVID-19 declaration under Section 564(b)(1) of the Act, 21 U.S.C. section 360bbb-3(b)(1), unless the authorization is terminated or revoked sooner.  Performed at [facility], [address], [place], [place] [address]  Lactic acid, plasma     Status: Abnormal   Collection Time: 04/25/20  8:57 PM  Result Value Ref Range   Lactic Acid, Venous 2.4 (HH) 0.5 - 1.9 mmol/L    Comment: CRITICAL RESULT CALLED TO, READ BACK BY AND VERIFIED WITH YESSICA AGUAS @2137  04/25/20 MJU Performed at [facility], [address] [place], [place] [address]   Procalcitonin     Status: None   Collection Time: 04/26/20  5:08 AM  Result Value Ref Range   Procalcitonin 5.06 ng/mL    Comment:        Interpretation: PCT > 2 ng/mL: Systemic infection (sepsis) is likely, unless other causes are known. (NOTE)       Sepsis PCT Algorithm           Lower Respiratory Tract                                      Infection PCT Algorithm    ----------------------------     ----------------------------         PCT < 0.25 ng/mL                PCT < 0.10 ng/mL          Strongly encourage             Strongly discourage   discontinuation of antibiotics    initiation of antibiotics    ----------------------------     -----------------------------       PCT 0.25 - 0.50 ng/mL            PCT 0.10 - 0.25 ng/mL               OR       >80% decrease in PCT             Discourage initiation of                                            antibiotics      Encourage discontinuation           of antibiotics    ----------------------------     -----------------------------         PCT >= 0.50 ng/mL              PCT 0.26 - 0.50 ng/mL               AND       <80% decrease in PCT              Encourage initiation of                                             antibiotics       Encourage continuation           of antibiotics    ----------------------------     -----------------------------        PCT >= 0.50 ng/mL                  PCT > 0.50 ng/mL               AND  increase in PCT                  Strongly encourage                                      initiation of antibiotics    Strongly encourage escalation           of antibiotics                                     -----------------------------                                           PCT <= 0.25 ng/mL                                                 OR                                        > 80% decrease in PCT                                      Discontinue / Do not initiate                                             antibiotics  Performed at [facility], [address] [place], [place] [address]   Protime-INR     Status: Abnormal   Collection Time: 04/26/20  5:08 AM  Result Value Ref Range   Prothrombin Time 17.7 (H) 11.4 - 15.2 seconds   INR 1.5 (H) 0.8 - 1.2    Comment: (NOTE) INR goal varies based on device and disease states. Performed at [facility], [address] [place], [place] [address]   CBC WITH DIFFERENTIAL     Status: Abnormal   Collection Time: 04/26/20  5:08 AM  Result Value Ref Range   WBC 1.0 (LL) 4.0 - 10.5 K/uL    Comment: CRITICAL VALUE NOTED.  VALUE IS CONSISTENT WITH PREVIOUSLY REPORTED AND CALLED VALUE.   RBC 2.45 (L) 4.22 - 5.81 MIL/uL   Hemoglobin 6.9 (L) 13.0 - 17.0 g/dL   HCT 21.0 (L) 39 - 52 %   MCV 85.7 80.0 - 100.0 fL    MCH 28.2 26.0 - 34.0 pg   MCHC 32.9 30.0 - 36.0 g/dL   RDW 20.3 (H) 11.5 - 15.5 %   Platelets 347 150 - 400 K/uL   nRBC 2.0 (H) 0.0 - 0.2 %   Neutrophils Relative % 32 %   Neutro Abs 0.3 (L) 1.7 - 7.7 K/uL   Lymphocytes Relative 38 %   Lymphs Abs 0.4 (L) 0.7 - 4.0 K/uL   Monocytes Relative 29 %   Monocytes Absolute 0.3 0 - 1 K/uL  Eosinophils Relative 0 %   Eosinophils Absolute 0.0 0 - 0 K/uL   Basophils Relative 0 %   Basophils Absolute 0.0 0 - 0 K/uL   WBC Morphology MILD LEFT SHIFT (1-5% METAS, OCC MYELO, OCC BANDS)    RBC Morphology MIXED RBC POPULATION    Smear Review Normal platelet morphology    Immature Granulocytes 1 %   Abs Immature Granulocytes 0.01 0.00 - 0.07 K/uL    Comment: Performed at [facility], [address] [place], [place] [address]  Basic metabolic panel     Status: Abnormal   Collection Time: 04/26/20  5:08 AM  Result Value Ref Range   Sodium 131 (L) 135 - 145 mmol/L   Potassium 3.4 (L) 3.5 - 5.1 mmol/L   Chloride 101 98 - 111 mmol/L   CO2 22 22 - 32 mmol/L   Glucose, Bld 93 70 - 99 mg/dL    Comment: Glucose reference range applies only to samples taken after fasting for at least 8 hours.   BUN 31 (H) 8 - 23 mg/dL   Creatinine, Ser 1.60 (H) 0.61 - 1.24 mg/dL   Calcium 8.2 (L) 8.9 - 10.3 mg/dL   GFR calc non Af Amer 42 (L) >60 mL/min   GFR calc Af Amer 49 (L) >60 mL/min   Anion gap 8 5 - 15    Comment: Performed at [facility], [address] [place], [place] [address]  Lactic acid, plasma     Status: None   Collection Time: 04/26/20  5:08 AM  Result Value Ref Range   Lactic Acid, Venous 1.1 0.5 - 1.9 mmol/L    Comment: Performed at [facility], [address], [place], [place] [address]  ABO/Rh     Status: None   Collection Time: 04/26/20  5:08 AM  Result Value Ref Range   ABO/RH(D)      O POS Performed at [facility], [address] [place], [place] [address]   Magnesium     Status: Abnormal    Collection Time: 04/26/20  5:08 AM  Result Value Ref Range   Magnesium 1.6 (L) 1.7 - 2.4 mg/dL    Comment: Performed at [facility], [address] [place], [place] [address]  Phosphorus     Status: None   Collection Time: 04/26/20  5:08 AM  Result Value Ref Range   Phosphorus 4.2 2.5 - 4.6 mg/dL    Comment: Performed at [facility], [address] [place], [place] [address]  Prepare RBC (crossmatch)     Status: None   Collection Time: 04/26/20  8:18 AM  Result Value Ref Range   Order Confirmation      ORDER PROCESSED BY BLOOD BANK Performed at [facility], [address] [place], [place] [address]   Type and screen [place]     Status: None (Preliminary result)   Collection Time: 04/26/20  9:25 AM  Result Value Ref Range   ABO/RH(D) O POS    Antibody Screen NEG    Sample Expiration 04/29/2020,2359    Unit Number [id number]    Blood Component Type RBC, LR IRR    Unit division 00    Status of Unit ISSUED    Transfusion Status OK TO TRANSFUSE    Crossmatch Result      Compatible Performed at [facility], [address] [place], [place] [address]   Blood gas, arterial     Status: Abnormal  Collection Time: 04/26/20  8:58 PM  Result Value Ref Range   FIO2 0.32    Delivery systems NASAL CANNULA    pH, Arterial 7.32 (L) 7.35 - 7.45   pCO2 arterial 30 (L) 32 - 48 mmHg   pO2, Arterial 57 (L) 83 - 108 mmHg   Bicarbonate 15.5 (L) 20.0 - 28.0 mmol/L   Acid-base deficit 9.5 (H) 0.0 - 2.0 mmol/L   O2 Saturation 86.6 %   Patient temperature 37.0    Collection site LEFT RADIAL    Sample type ARTERIAL DRAW    Allens test (pass/fail) PASS PASS    Comment: Performed at [facility], [address] [place], [place] [address]   DG Chest 1 View  Result Date: 04/26/2020 CLINICAL DATA:  Shortness of breath EXAM: CHEST  1 VIEW COMPARISON:  04/25/2020, CT 03/19/2020 FINDINGS: Stable cardiomediastinal  silhouette. Interim development of vascular congestion and moderate diffuse bilateral interstitial opacities suspicious for pulmonary edema. More focal areas of airspace disease within the peripheral right greater than left lung suspicious for pneumonia. Possible tiny pleural effusions. No pneumothorax. IMPRESSION: Interim development of vascular congestion and moderate diffuse bilateral interstitial opacities suspicious for pulmonary edema. More focal areas of airspace disease within the peripheral right greater than left lung suspicious for pneumonia. Electronically Signed   By: Donavan Foil M.D.   On: 04/26/2020 21:33   DG Chest Port 1 View  Result Date: 04/25/2020 CLINICAL DATA:  Fever short of breath EXAM: PORTABLE CHEST 1 VIEW COMPARISON:  02/29/2020 FINDINGS: Patchy bilateral somewhat peripheral pulmonary opacity right greater than left. No pleural effusion. Normal heart size. No pneumothorax. IMPRESSION: Patchy bilateral peripheral pulmonary infiltrates right greater than left concerning for bilateral pneumonia, possible atypical or viral pneumonia. Electronically Signed   By: Donavan Foil M.D.   On: 04/25/2020 19:26   ECHOCARDIOGRAM COMPLETE  Result Date: 04/26/2020    ECHOCARDIOGRAM REPORT   Patient Name:   Ruben [name] [facility] Date of Exam: 04/26/2020 Medical Rec #:  [id number]       Height:       72.0 in Accession #:    [id number]      Weight:       164.9 lb Date of Birth:  30-Apr-1946       BSA:          1.963 m Patient Age:    80 years        BP:           114/63 mmHg Patient Gender: M               HR:           91 bpm. Exam Location:  [facility] Procedure: 2D Echo, Color Doppler and Cardiac Doppler Indications:     R01.1 Murmur  History:         Patient has no prior history of Echocardiogram examinations.                  CKD; Arrythmias:RBBB.  Sonographer:     Charmayne Sheer RDCS (AE) Referring Phys:  [id number] Cleaster Corin PATEL Diagnosing Phys: Kathlyn Sacramento MD IMPRESSIONS  1. Left ventricular ejection fraction,  by estimation, is 55 to 60%. The left ventricle has normal function. The left ventricle has no regional wall motion abnormalities. Left ventricular diastolic parameters were normal.  2. Right ventricular systolic function is normal. The right ventricular size is normal. There is mildly elevated pulmonary artery systolic pressure.  3. Left atrial size  was mild to moderately dilated.  4. The mitral valve is normal in structure. Mild to moderate mitral valve regurgitation. No evidence of mitral stenosis.  5. The aortic valve is abnormal. Aortic valve regurgitation is mild. Mild aortic valve stenosis. Aortic valve area, by VTI measures 1.82 cm. Aortic valve mean gradient measures 10.0 mmHg.  6. The inferior vena cava is normal in size with <50% respiratory variability, suggesting right atrial pressure of 8 mmHg. FINDINGS  Left Ventricle: Left ventricular ejection fraction, by estimation, is 55 to 60%. The left ventricle has normal function. The left ventricle has no regional wall motion abnormalities. The left ventricular internal cavity size was normal in size. There is  no left ventricular hypertrophy. Left ventricular diastolic parameters were normal. Right Ventricle: The right ventricular size is normal. No increase in right ventricular wall thickness. Right ventricular systolic function is normal. There is mildly elevated pulmonary artery systolic pressure. The tricuspid regurgitant velocity is 2.97  m/s, and with an assumed right atrial pressure of 8 mmHg, the estimated right ventricular systolic pressure is 62.8 mmHg. Left Atrium: Left atrial size was mild to moderately dilated. Right Atrium: Right atrial size was normal in size. Pericardium: There is no evidence of pericardial effusion. Mitral Valve: The mitral valve is normal in structure. Normal mobility of the mitral valve leaflets. Mild to moderate mitral valve regurgitation. No evidence of mitral valve stenosis. MV peak gradient, 4.8 mmHg. The mean mitral  valve gradient is 2.0 mmHg. Tricuspid Valve: The tricuspid valve is normal in structure. Tricuspid valve regurgitation is mild . No evidence of tricuspid stenosis. Aortic Valve: The aortic valve is abnormal. Aortic valve regurgitation is mild. Aortic regurgitation PHT measures 550 msec. Mild aortic stenosis is present. Aortic valve mean gradient measures 10.0 mmHg. Aortic valve peak gradient measures 17.5 mmHg. Aortic valve area, by VTI measures 1.82 cm. Pulmonic Valve: The pulmonic valve was normal in structure. Pulmonic valve regurgitation is trivial. No evidence of pulmonic stenosis. Aorta: The aortic root is normal in size and structure. Venous: The inferior vena cava is normal in size with less than 50% respiratory variability, suggesting right atrial pressure of 8 mmHg. IAS/Shunts: No atrial level shunt detected by color flow Doppler.  LEFT VENTRICLE PLAX 2D LVIDd:         4.70 cm  Diastology LVIDs:         3.13 cm  LV e' lateral:   8.16 cm/s LV PW:         0.94 cm  LV E/e' lateral: 12.0 LV IVS:        0.89 cm  LV e' medial:    6.85 cm/s LVOT diam:     2.00 cm  LV E/e' medial:  14.3 LV SV:         70 LV SV Index:   36 LVOT Area:     3.14 cm  RIGHT VENTRICLE RV Basal diam:  3.68 cm LEFT ATRIUM             Index       RIGHT ATRIUM           Index LA diam:        4.30 cm 2.19 cm/m  RA Area:     16.80 cm LA Vol (A2C):   66.6 ml 33.93 ml/m RA Volume:   41.10 ml  20.94 ml/m LA Vol (A4C):   85.0 ml 43.31 ml/m LA Biplane Vol: 78.9 ml 40.20 ml/m  AORTIC VALVE  PULMONIC VALVE AV Area (Vmax):    1.68 cm     PV Vmax:       1.56 m/s AV Area (Vmean):   1.71 cm     PV Vmean:      102.000 cm/s AV Area (VTI):     1.82 cm     PV VTI:        0.267 m AV Vmax:           209.33 cm/s  PV Peak grad:  9.7 mmHg AV Vmean:          148.000 cm/s PV Mean grad:  5.0 mmHg AV VTI:            0.386 m AV Peak Grad:      17.5 mmHg AV Mean Grad:      10.0 mmHg LVOT Vmax:         112.00 cm/s LVOT Vmean:        80.700  cm/s LVOT VTI:          0.223 m LVOT/AV VTI ratio: 0.58 AI PHT:            550 msec  AORTA Ao Root diam: 3.60 cm MITRAL VALVE               TRICUSPID VALVE MV Area (PHT): 5.54 cm    TR Peak grad:   35.3 mmHg MV Peak grad:  4.8 mmHg    TR Vmax:        297.00 cm/s MV Mean grad:  2.0 mmHg MV Vmax:       1.10 m/s    SHUNTS MV Vmean:      70.4 cm/s   Systemic VTI:  0.22 m MV Decel Time: 137 msec    Systemic Diam: 2.00 cm MV E velocity: 97.70 cm/s MV A velocity: 59.10 cm/s MV E/A ratio:  1.65 Kathlyn Sacramento MD Electronically signed by Kathlyn Sacramento MD Signature Date/Time: 04/26/2020/12:15:51 PM    Final     Assessment:  The patient is a 74 y.o. 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  He was admitted with fever and neutropenia.  He has had a history of pneumonia since 02/2020.  Chest x-ray on 04/26/2020 revealed interval development of vascular congestion and moderate diffuse bilateral interstitial opacities suspicious for pulmonary edema.  There are more focal areas of airspace disease within the peripheral right> left lung suspicious for pneumonia   He has Crohn's colitis.  Symptomatically, he notes fatigue.  He has diarrhea.  Cough is chronic.  Exam reveals a new murmur and bilateral lower lobe dry crackles.  Plan:   1.   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  He is day 26 of cycle #1 azacitidine + Venetoclax.  Chemotherapy on hold.  Patient was scheduled for outpatient bone marrow tomorrow.  Anticipate reassessment of disease status after discharge.  Patient expresses a wish to have more of his treatment locally. 2.   Anemia and leukopenia  Etiology secondary to marrow replacement with AML and treatment with chemotherapy.  Maintain active type and screen.  Transfuse to maintain Hgb > 8.0.  ALL products leukopoor and irradiated.  Neutropenic precautions.  CBC with  diff daily. 3.   Fever and neutropenia  Patient has had infiltrates prior to initiation of induction chemotherapy.  Last CX[facility] showed improvement.  Continue broad spectrum antibiotics.  Blood culture q 24 hours  prn temp>= 100.4  4.   Diarrhea  Patient has frequent loose stools.  Etiology possibly related to Crohn's.  Send stool for C diff. 5.   Code status  Partial per chart.  Thank you for allowing me to participate in Ruben [name] 's care.  I will follow him closely with you while hospitalized and after discharge in the outpatient department.   Lequita Asal, MD  04/26/2020, 9:40 PM

## 2020-04-26 NOTE — Progress Notes (Signed)
The 1 unit of PRBC completed. Patient tolerated well.

## 2020-04-26 NOTE — Progress Notes (Signed)
NP notified of pt's tachycardia and tachypnea. Orders for IV Lasix, IV morphine, Stat CXR, ABG, and transfer to Stepdown. Pt transferred and placed on BIPAP. WIfe [name]) called and updated.

## 2020-04-26 NOTE — Progress Notes (Signed)
Progress Note    Ruben [name]  OHY:[id number] DOB: 07-22-1946  DOA: 04/25/2020 PCP: [name], MD      Brief Narrative:    Medical records reviewed and are as summarized below:  Ruben [name] is a 74 y.o. male       Assessment/Plan:   Principal Problem:   Sepsis due to pneumonia [facility]) Active Problems:   Crohn's disease of small intestine without complication (HCC)   Hyponatremia   CKD (chronic kidney disease) stage 3, GFR 30-59 ml/min   Normocytic anemia   Acute myeloid leukemia not having achieved remission (HCC)   Febrile neutropenia ([place])   Leukopenia due to antineoplastic chemotherapy ([place])   Sepsis secondary to pneumonia, neutropenic fever: Patient was febrile, tachycardic and tachypneic and has pneumonia.meets sepsis criteria.  Lactic acid (2.4) and procalcitonin (5.06) were elevated as well.  Continue empiric IV antibiotics.  Follow-up CBC and blood cultures.  Patient may need Granix for neutropenia.  Consulted oncologist to assist with management.  Severe anemia, normocytic: Transfuse 1 unit of packed red blood cells.  Risk, benefits and alternatives of blood transfusion were discussed.  Patient is agreeable to blood transfusion.  He said he just got blood transfusion on 04/18/2020.  He has had multiple blood and platelet transfusions in the past.  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  Continue allopurinol, Valtrex and posaconazole for ATLS, CMV and fungal infection prophylaxis.  Hypokalemia: Replete potassium and monitor levels.  Acute on chronic hyponatremia: Improved and is close to baseline.  Crohn's disease: Stable    Body mass index is 22.36 kg/m.  Diet Order            Diet regular Room service appropriate? Yes; Fluid consistency: Thin  Diet effective now                       Medications:   . allopurinol  150 mg Oral Daily  . finasteride  5 mg Oral Q  supper  . folic acid  1 mg Oral Daily  . heparin  5,000 Units Subcutaneous Q8H  . posaconazole  300 mg Oral Daily  . potassium chloride  40 mEq Oral Once  . sodium bicarbonate  650 mg Oral BID  . valACYclovir  500 mg Oral Daily  . vitamin B-12  1,000 mcg Oral Daily   Continuous Infusions: . sodium chloride Stopped (04/26/20 0835)  . azithromycin 250 mL/hr at 04/26/20 0837  . ceFEPime (MAXIPIME) IV 2 g (04/26/20 1018)  . vancomycin 1,000 mg (04/26/20 1207)     Anti-infectives (From admission, onward)   Start     Dose/Rate Route Frequency Ordered Stop   04/26/20 1200  vancomycin (VANCOCIN) IVPB 1000 mg/200 mL premix     Discontinue     1,000 mg 200 mL/hr over 60 Minutes Intravenous Every 24 hours 04/26/20 0340     04/26/20 1000  posaconazole (NOXAFIL) delayed-release tablet 300 mg     Discontinue     300 mg Oral Daily 04/25/20 2216     04/26/20 1000  valACYclovir (VALTREX) tablet 500 mg     Discontinue     500 mg Oral Daily 04/25/20 2216     04/26/20 0800  azithromycin (ZITHROMAX) 500 mg in sodium chloride 0.9 % 250 mL IVPB     Discontinue     500 mg 250 mL/hr over 60 Minutes Intravenous Every 24 hours 04/25/20 2211  04/26/20 0800  ceFEPIme (MAXIPIME) 2 g in sodium chloride 0.9 % 100 mL IVPB     Discontinue     2 g 200 mL/hr over 30 Minutes Intravenous Every 12 hours 04/25/20 2313     04/25/20 1930  ceFEPIme (MAXIPIME) 2 g in sodium chloride 0.9 % 100 mL IVPB        2 g 200 mL/hr over 30 Minutes Intravenous  Once 04/25/20 1916 04/25/20 2009   04/25/20 1930  metroNIDAZOLE (FLAGYL) IVPB 500 mg        500 mg 100 mL/hr over 60 Minutes Intravenous  Once 04/25/20 1916 04/25/20 2140   04/25/20 1930  vancomycin (VANCOCIN) IVPB 1000 mg/200 mL premix        1,000 mg 200 mL/hr over 60 Minutes Intravenous  Once 04/25/20 1916 04/25/20 2139             Family Communication/Anticipated D/C date and plan/Code Status   DVT prophylaxis: heparin injection 5,000 Units Start:  04/25/20 2300     Code Status: Partial Code  Family Communication: Plan discussed with patient Disposition Plan:    Status is: Inpatient  Remains inpatient appropriate because:IV treatments appropriate due to intensity of illness or inability to take PO and Inpatient level of care appropriate due to severity of illness   Dispo: The patient is from: Home              Anticipated d/c is to: Home              Anticipated d/c date is: 2 days              Patient currently is not medically stable to d/c.           Subjective:   C/o watery nonbloody diarrhea.  He said he has had multiple episodes of watery stools this morning although stools are scanty.  No vomiting or abdominal pain.  He has an occasional cough.  No shortness of breath or chest pain.  Objective:    Vitals:   04/26/20 0442 04/26/20 0755 04/26/20 1132 04/26/20 1159  BP: 116/69 114/63 124/69 126/72  Pulse: 81 84 87   Resp:      Temp: (!) 97.5 F (36.4 C) 98.1 F (36.7 C) 98 F (36.7 C) 98 F (36.7 C)  TempSrc:  Oral Oral Axillary  SpO2: 100% 100% 100% 100%  Weight: 74.8 kg     Height: 6' (1.829 m)      No data found.   Intake/Output Summary (Last 24 hours) at 04/26/2020 1310 Last data filed at 04/26/2020 1231 Gross per 24 hour  Intake 2221.59 ml  Output 500 ml  Net 1721.59 ml   Filed Weights   04/25/20 1823 04/26/20 0442  Weight: 68.9 kg 74.8 kg    Exam:  GEN: NAD SKIN: No rash EYES: EOMI ENT: MMM CV: RRR PULM: CTA B ABD: soft, ND, NT, +BS CNS: AAO x 3, non focal EXT: No edema or tenderness   Data Reviewed:   I have personally reviewed following labs and imaging studies:  Labs: Labs show the following:   Basic Metabolic Panel: Recent Labs  Lab 04/25/20 1909 04/26/20 0508  NA 127* 131*  K 3.8 3.4*  CL 96* 101  CO2 20* 22  GLUCOSE 118* 93  BUN 34* 31*  CREATININE 1.84* 1.60*  CALCIUM 8.3* 8.2*   GFR Estimated Creatinine Clearance: 43.5 mL/min (A) (by C-G formula  based on SCr of 1.6 mg/dL (H)). Liver Function Tests:  Recent Labs  Lab 04/25/20 1909  AST 52*  ALT 13  ALKPHOS 89  BILITOT 0.7  PROT 8.8*  ALBUMIN 3.0*   No results for input(s): LIPASE, AMYLASE in the last 168 hours. No results for input(s): AMMONIA in the last 168 hours. Coagulation profile Recent Labs  Lab 04/25/20 1909 04/26/20 0508  INR 1.3* 1.5*    CBC: Recent Labs  Lab 04/25/20 1909 04/26/20 0508  WBC 1.3* 1.0*  NEUTROABS 0.4* 0.3*  HGB 8.4* 6.9*  HCT 25.7* 21.0*  MCV 85.7 85.7  PLT 482* 347   Cardiac Enzymes: No results for input(s): CKTOTAL, CKMB, CKMBINDEX, TROPONINI in the last 168 hours. BNP (last 3 results) No results for input(s): PROBNP in the last 8760 hours. CBG: No results for input(s): GLUCAP in the last 168 hours. D-Dimer: No results for input(s): DDIMER in the last 72 hours. Hgb A1c: No results for input(s): HGBA1C in the last 72 hours. Lipid Profile: No results for input(s): CHOL, HDL, LDLCALC, TRIG, CHOLHDL, LDLDIRECT in the last 72 hours. Thyroid function studies: No results for input(s): TSH, T4TOTAL, T3FREE, THYROIDAB in the last 72 hours.  Invalid input(s): FREET3 Anemia work up: No results for input(s): VITAMINB12, FOLATE, FERRITIN, TIBC, IRON, RETICCTPCT in the last 72 hours. Sepsis Labs: Recent Labs  Lab 04/25/20 1909 04/25/20 2057 04/26/20 0508  PROCALCITON  --   --  5.06  WBC 1.3*  --  1.0*  LATICACIDVEN 1.9 2.4* 1.1    Microbiology Recent Results (from the past 240 hour(s))  Culture, blood (Routine x 2)     Status: None (Preliminary result)   Collection Time: 04/25/20  7:09 PM   Specimen: BLOOD  Result Value Ref Range Status   Specimen Description BLOOD BLOOD LEFT FOREARM  Final   Special Requests   Final    BOTTLES DRAWN AEROBIC AND ANAEROBIC Blood Culture adequate volume   Culture   Final    NO GROWTH < 12 HOURS Performed at [facility], [address], [place], [place] [address]    Report Status  PENDING  Incomplete  Culture, blood (Routine x 2)     Status: None (Preliminary result)   Collection Time: 04/25/20  7:12 PM   Specimen: BLOOD  Result Value Ref Range Status   Specimen Description BLOOD BLOOD RIGHT FOREARM  Final   Special Requests   Final    BOTTLES DRAWN AEROBIC AND ANAEROBIC Blood Culture adequate volume   Culture   Final    NO GROWTH < 12 HOURS Performed at [facility], [address] [place], [place] [address]    Report Status PENDING  Incomplete  SARS Coronavirus 2 by RT PCR (hospital order, performed in [place] hospital lab) Nasopharyngeal Nasopharyngeal Swab     Status: None   Collection Time: 04/25/20  8:12 PM   Specimen: Nasopharyngeal Swab  Result Value Ref Range Status   SARS Coronavirus 2 NEGATIVE NEGATIVE Final    Comment: (NOTE) SARS-CoV-2 target nucleic acids are NOT DETECTED.  The SARS-CoV-2 RNA is generally detectable in upper and lower respiratory specimens during the acute phase of infection. The lowest concentration of SARS-CoV-2 viral copies this assay can detect is 250 copies / mL. A negative result does not preclude SARS-CoV-2 infection and should not be used as the sole basis for treatment or other patient management decisions.  A negative result may occur with improper specimen collection / handling, submission of specimen other than nasopharyngeal swab, presence of viral mutation(s) within the areas  targeted by this assay, and inadequate number of viral copies (<250 copies / mL). A negative result must be combined with clinical observations, patient history, and epidemiological information.  Fact Sheet for Patients:   StrictlyIdeas.no  Fact Sheet for Healthcare Providers: BankingDealers.co.za  This test is not yet approved or  cleared by the [place] FDA and has been authorized for detection and/or diagnosis of SARS-CoV-2 by FDA under an Emergency Use Authorization  (EUA).  This EUA will remain in effect (meaning this test can be used) for the duration of the COVID-19 declaration under Section 564(b)(1) of the Act, 21 U.S.C. section 360bbb-3(b)(1), unless the authorization is terminated or revoked sooner.  Performed at [facility], [address] [place], [place] [address]     Procedures and diagnostic studies:  DG [facility] 1 View  Result Date: 04/25/2020 CLINICAL DATA:  Fever short of breath EXAM: PORTABLE CHEST 1 VIEW COMPARISON:  02/29/2020 FINDINGS: Patchy bilateral somewhat peripheral pulmonary opacity right greater than left. No pleural effusion. Normal heart size. No pneumothorax. IMPRESSION: Patchy bilateral peripheral pulmonary infiltrates right greater than left concerning for bilateral pneumonia, possible atypical or viral pneumonia. Electronically Signed   By: Donavan Foil M.D.   On: 04/25/2020 19:26   ECHOCARDIOGRAM COMPLETE  Result Date: 04/26/2020    ECHOCARDIOGRAM REPORT   Patient Name:   CHEROKEE CLOWERS [facility] Date of Exam: 04/26/2020 Medical Rec #:  [id number]       Height:       72.0 in Accession #:    [id number]      Weight:       164.9 lb Date of Birth:  1946-06-18       BSA:          1.963 m Patient Age:    68 years        BP:           114/63 mmHg Patient Gender: M               HR:           91 bpm. Exam Location:  [facility] Procedure: 2D Echo, Color Doppler and Cardiac Doppler Indications:     R01.1 Murmur  History:         Patient has no prior history of Echocardiogram examinations.                  CKD; Arrythmias:RBBB.  Sonographer:     Charmayne Sheer RDCS (AE) Referring Phys:  [id number] Cleaster Corin PATEL Diagnosing Phys: Kathlyn Sacramento MD IMPRESSIONS  1. Left ventricular ejection fraction, by estimation, is 55 to 60%. The left ventricle has normal function. The left ventricle has no regional wall motion abnormalities. Left ventricular diastolic parameters were normal.  2. Right ventricular systolic function is normal. The right ventricular  size is normal. There is mildly elevated pulmonary artery systolic pressure.  3. Left atrial size was mild to moderately dilated.  4. The mitral valve is normal in structure. Mild to moderate mitral valve regurgitation. No evidence of mitral stenosis.  5. The aortic valve is abnormal. Aortic valve regurgitation is mild. Mild aortic valve stenosis. Aortic valve area, by VTI measures 1.82 cm. Aortic valve mean gradient measures 10.0 mmHg.  6. The inferior vena cava is normal in size with <50% respiratory variability, suggesting right atrial pressure of 8 mmHg. FINDINGS  Left Ventricle: Left ventricular ejection fraction, by estimation, is 55 to 60%. The left ventricle has normal function. The left ventricle has  no regional wall motion abnormalities. The left ventricular internal cavity size was normal in size. There is  no left ventricular hypertrophy. Left ventricular diastolic parameters were normal. Right Ventricle: The right ventricular size is normal. No increase in right ventricular wall thickness. Right ventricular systolic function is normal. There is mildly elevated pulmonary artery systolic pressure. The tricuspid regurgitant velocity is 2.97  m/s, and with an assumed right atrial pressure of 8 mmHg, the estimated right ventricular systolic pressure is 02.7 mmHg. Left Atrium: Left atrial size was mild to moderately dilated. Right Atrium: Right atrial size was normal in size. Pericardium: There is no evidence of pericardial effusion. Mitral Valve: The mitral valve is normal in structure. Normal mobility of the mitral valve leaflets. Mild to moderate mitral valve regurgitation. No evidence of mitral valve stenosis. MV peak gradient, 4.8 mmHg. The mean mitral valve gradient is 2.0 mmHg. Tricuspid Valve: The tricuspid valve is normal in structure. Tricuspid valve regurgitation is mild . No evidence of tricuspid stenosis. Aortic Valve: The aortic valve is abnormal. Aortic valve regurgitation is mild. Aortic  regurgitation PHT measures 550 msec. Mild aortic stenosis is present. Aortic valve mean gradient measures 10.0 mmHg. Aortic valve peak gradient measures 17.5 mmHg. Aortic valve area, by VTI measures 1.82 cm. Pulmonic Valve: The pulmonic valve was normal in structure. Pulmonic valve regurgitation is trivial. No evidence of pulmonic stenosis. Aorta: The aortic root is normal in size and structure. Venous: The inferior vena cava is normal in size with less than 50% respiratory variability, suggesting right atrial pressure of 8 mmHg. IAS/Shunts: No atrial level shunt detected by color flow Doppler.  LEFT VENTRICLE PLAX 2D LVIDd:         4.70 cm  Diastology LVIDs:         3.13 cm  LV e' lateral:   8.16 cm/s LV PW:         0.94 cm  LV E/e' lateral: 12.0 LV IVS:        0.89 cm  LV e' medial:    6.85 cm/s LVOT diam:     2.00 cm  LV E/e' medial:  14.3 LV SV:         70 LV SV Index:   36 LVOT Area:     3.14 cm  RIGHT VENTRICLE RV Basal diam:  3.68 cm LEFT ATRIUM             Index       RIGHT ATRIUM           Index LA diam:        4.30 cm 2.19 cm/m  RA Area:     16.80 cm LA Vol (A2C):   66.6 ml 33.93 ml/m RA Volume:   41.10 ml  20.94 ml/m LA Vol (A4C):   85.0 ml 43.31 ml/m LA Biplane Vol: 78.9 ml 40.20 ml/m  AORTIC VALVE                    PULMONIC VALVE AV Area (Vmax):    1.68 cm     PV Vmax:       1.56 m/s AV Area (Vmean):   1.71 cm     PV Vmean:      102.000 cm/s AV Area (VTI):     1.82 cm     PV VTI:        0.267 m AV Vmax:           209.33 cm/s  PV Peak grad:  9.7 mmHg AV Vmean:          148.000 cm/s PV Mean grad:  5.0 mmHg AV VTI:            0.386 m AV Peak Grad:      17.5 mmHg AV Mean Grad:      10.0 mmHg LVOT Vmax:         112.00 cm/s LVOT Vmean:        80.700 cm/s LVOT VTI:          0.223 m LVOT/AV VTI ratio: 0.58 AI PHT:            550 msec  AORTA Ao Root diam: 3.60 cm MITRAL VALVE               TRICUSPID VALVE MV Area (PHT): 5.54 cm    TR Peak grad:   35.3 mmHg MV Peak grad:  4.8 mmHg    TR Vmax:         297.00 cm/s MV Mean grad:  2.0 mmHg MV Vmax:       1.10 m/s    SHUNTS MV Vmean:      70.4 cm/s   Systemic VTI:  0.22 m MV Decel Time: 137 msec    Systemic Diam: 2.00 cm MV E velocity: 97.70 cm/s MV A velocity: 59.10 cm/s MV E/A ratio:  1.65 Kathlyn Sacramento MD Electronically signed by Kathlyn Sacramento MD Signature Date/Time: 04/26/2020/12:15:51 PM    Final                LOS: 1 day   Orene Abbasi  [facility]     04/26/2020, 1:10 PM

## 2020-04-26 NOTE — TOC Initial Note (Signed)
Transition of Care [facility]) - Initial/Assessment Note    Patient Details  Name: Ruben [name] MRN: [id number] Date of Birth: [birth year]  Transition of Care [facility]) CM/SW Contact:    Shelbie Ammons, RN Phone Number: 04/26/2020, 12:59 PM  Clinical Narrative:  RNCM met with patient and wife at bedside. Patient lying in bed, reports to feeling a little bit better today. He was admitted to hospital due to Sepsis related to his Neutropenia. Patient lives at home with his wife and is for the most part independent. He does not use any assistive devices and reports that he still drives and is able to get himself back forth to appointments. Dr. Caryn Section is his primary MD and [facility] [address] [facility]. Patient denies any other needs at this time but RNCM will remain available for any further needs.                  Expected Discharge Plan: Home/Self Care Barriers to Discharge: No Barriers Identified   Patient Goals and CMS Choice        Expected Discharge Plan and Services Expected Discharge Plan: Home/Self Care   Discharge Planning Services: CM Consult   Living arrangements for the past 2 months: Single Family Home                                      Prior Living Arrangements/Services Living arrangements for the past 2 months: Single Family Home Lives with:: Spouse Patient language and need for interpreter reviewed:: Yes Do you feel safe going back to the place where you live?: Yes      Need for Family Participation in Patient Care: Yes (Comment) Care giver support system in place?: Yes (comment)   Criminal Activity/Legal Involvement Pertinent to Current Situation/Hospitalization: No - Comment as needed  Activities of Daily Living Home Assistive Devices/Equipment: Eyeglasses ADL Screening (condition at time of admission) Patient's cognitive ability adequate to safely complete daily activities?: Yes Is the patient deaf or have difficulty hearing?:  No Does the patient have difficulty seeing, even when wearing glasses/contacts?: No Does the patient have difficulty concentrating, remembering, or making decisions?: No Patient able to express need for assistance with ADLs?: Yes Does the patient have difficulty dressing or bathing?: No Independently performs ADLs?: Yes (appropriate for developmental age) Does the patient have difficulty walking or climbing stairs?: No Weakness of Legs: None Weakness of Arms/Hands: None  Permission Sought/Granted                  Emotional Assessment Appearance:: Appears stated age Attitude/Demeanor/Rapport: Engaged, Gracious Affect (typically observed): Appropriate Orientation: : Oriented to Self, Oriented to Place, Oriented to  Time, Oriented to Situation Alcohol / Substance Use: Not Applicable Psych Involvement: No (comment)  Admission diagnosis:  Atypical pneumonia [J18.9] Neutropenic fever ([place]) [D70.9, R50.81] Sepsis ([place]) [A41.9] Sepsis due to pneumonia ([place]) [J18.9, A41.9] Sepsis without acute organ dysfunction, due to unspecified organism [facility]) [A41.9] Patient Active Problem List   Diagnosis Date Noted  . Sepsis due to pneumonia ([place]) 04/25/2020  . Febrile neutropenia ([place]) 04/25/2020  . Leukopenia due to antineoplastic chemotherapy ([place]) 04/25/2020  . Epistaxis, recurrent 04/01/2020  . 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 Normocytic anemia 03/02/2020  . Weight loss 03/02/2020  . Pneumonia of both lungs due to infectious organism 03/01/2020  . Elevated total protein 12/04/2019  .  Secondary hyperparathyroidism of renal origin ([place]) 09/20/2019  . CKD (chronic kidney disease) stage 3, GFR 30-59 ml/min 09/20/2019  . Foot drop, right 06/09/2019  . Crohn's disease of small intestine without complication ([place]) 10/22/4386  . Right-sided thoracic back pain 04/06/2019  . Hyponatremia 04/06/2019  . Iron deficiency anemia 11/26/2018  . Goals of care,  counseling/discussion 01/06/2018  . Myeloproliferative neoplasm ([place]) 01/04/2018  . High triglycerides 10/14/2017  . 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 Renal insufficiency 07/23/2016  . 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 Right ankle pain 10/31/2015  . Hyperuricemia 09/05/2015  . BPH (benign prostatic hyperplasia) 08/23/2015  . History of tobacco use 08/23/2015  . Glaucoma 08/23/2015  . Enlarged prostate without lower urinary tract symptoms (luts) 08/23/2015  . Elevated prostate specific antigen (PSA) 08/23/2015  . Dermatitis 08/23/2015  . Crohn disease ([place]) 08/23/2015  . Other prurigo 08/23/2015  . Dorsalgia 08/23/2015  . Personal history of nicotine dependence 08/23/2015  . Polycythemia vera ([place]) 04/04/2015  . Secondary polycythemia 03/28/2015  . 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 Right bundle-branch block 05/03/2012   PCP:  [name], MD [facility]   [id number] [place] [address]Phone: [phone]: [address]Phone: [phone]: [phone]    Social Determinants of Health ([place] Interventions    Readmission Risk Interventions Readmission Risk Prevention Plan 04/26/2020  Transportation Screening Complete  PCP or Specialist Appt within 3-5 Days Complete  Social Work Consult for R[place]anning/Counseling [place] Applicable  Medication Review (Press photographer Complete  Some recent data might be hidden

## 2020-04-26 NOTE — Consult Note (Addendum)
NAME: Ruben [name]  DOB: [birth year]  MRN: [id number]  Date/Time: 04/26/2020 8:43 PM  REQUESTING PROVIDER: Dr. Mike Gip Subjective:  REASON FOR CONSULT: Febrile neutropenia ? Ruben [name] is a 74 y.o. 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  He has chronic urinary retention for which she does intermittent self-catheterization. In the ED temperature was 101.8, BP 147/ 63, respiratory rate of 24, pulse ox of 100%, heart rate of 118. Labs revealed a WBC of 1.3, hemoglobin of 8.4 and platelet of 482.  Lactate was 1.9 and then went up to 2.4.  Procalcitonin was 5.06.  Creatinine was 1.84.  Blood cultures were sent.  Chest x-ray revealed bilateral pulmonary right greater than left infiltrates.  He was initially started on vancomycin cefepime and metronidazole and azithromycin was added.  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  He was followed by Dr. Mike Gip and was undergoing phlebotomy. 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  He was briefly on hydroxyurea between 12/05/2015 until 12/19/2015.   CBC on 03/02/2020 revealed a hematocrit of 28.9, hemoglobin of 8.9, platelet of 101,000 and WBC of 42,000. Bone marrow done on 02/28/2020 confirmed AML. A CT chest done on 03/11/2020 revealed multifocal groundglass and consolidative opacity in the lungs bilaterally.  He has been treated with a prolonged course of antibiotics initially was given azithromycin then switched to Levaquin recently.  Pt today developed sob and is  transferred to ICU with diagnosis of fluid overload He had received IV fluids and 1 unit PRBC since admisison He is hypoxic and on NRB- he is sedated with ativan  Past Medical History:  Diagnosis Date  . Blood dyscrasia   . BPH (benign prostatic hyperplasia)   . Cancer ([place])    SKIN/ POLYCYTHEMIA VERA  . Chronic kidney disease    RENAL INSUFF (40%)  . Crohn disease ([place])   . Crohn's disease ([place])   . Glaucoma   . Glaucoma   . Gout   . History of chicken pox   . Myelofibrosis ([place])   . Nosebleed   . RBBB   . Right bundle branch block     Past Surgical History:  Procedure Laterality Date  . CATARACT EXTRACTION W/PHACO Left 07/13/2018   Procedure: CATARACT EXTRACTION PHACO AND INTRAOCULAR LENS PLACEMENT (IOC);  Surgeon: [name], MD;  Location: [facility] ORS;  Service: Ophthalmology;  Laterality: Left;  [place] 00:39  CDE 5.14 Fluid pack lot # [id number]  . CATARACT EXTRACTION W/PHACO Right 08/03/2018   Procedure: CATARACT EXTRACTION PHACO AND INTRAOCULAR LENS PLACEMENT (IOC);  Surgeon: [name], MD;  Location: [facility] ORS;  Service: Ophthalmology;  Laterality: Right;  [place] 00:43.4 CDE 5.23 Fluid pack Lot # [id number]  . COLONOSCOPY WITH PROPOFOL N/A 03/27/2017   Procedure: COLONOSCOPY WITH PROPOFOL;  Surgeon: [name], MD;  Location: [facility] ENDOSCOPY;  Service: Endoscopy;  Laterality: N/A;  . MOHS SURGERY     EAR  . PROSTATE SURGERY  2008   Prostate Biopsy in [place] to elevated PSA.  per patient normal  . Skin Lesion Basal cell removed    . wart removal  from eyelid    Social History   Socioeconomic History  . Marital status: Married    Spouse name: Not on file  . Number of children: 2  . Years of education: Not on file  . Highest education level: Bachelor's degree (e.g., BA, AB, BS)  Occupational History  . Occupation: Retired  Tobacco Use  . Smoking status: Former Smoker    Packs/day: 0.25    Years: 20.00    Pack years: 5.00    Types: Cigarettes     Quit date: 09/28/2003    Years since quitting: 16.5  . Smokeless tobacco: Never Used  Vaping Use  . Vaping Use: Never used  Substance and Sexual Activity  . Alcohol use: Not Currently    Alcohol/week: 4.0 - 5.0 standard drinks    Types: 4 - 5 Glasses of wine per week  . Drug use: No  . Sexual activity: Not on file  Other Topics Concern  . Not on file  Social History Narrative  . Not on file   Social Determinants of Health   Financial Resource Strain:   . Difficulty of Paying Living Expenses:   Food Insecurity:   . Worried About Charity fundraiser in the Last Year:   . Arboriculturist in the Last Year:   Transportation Needs:   . Film/video editor (Medical):   Marland Kitchen Lack of Transportation (Non-Medical):   Physical Activity:   . Days of Exercise per Week:   . Minutes of Exercise per Session:   Stress:   . Feeling of Stress :   Social Connections:   . Frequency of Communication with Friends and Family:   . Frequency of Social Gatherings with Friends and Family:   . Attends Religious Services:   . Active Member of Clubs or Organizations:   . Attends Archivist Meetings:   Marland Kitchen Marital Status:   Intimate Partner Violence:   . Fear of Current or Ex-Partner:   . Emotionally Abused:   Marland Kitchen Physically Abused:   . Sexually Abused:     Family History  Problem Relation Age of Onset  . Cancer Mother        Liver  . Cancer Father        Prostate  . Cancer Sister        Hodgkins lymphoma  . Cancer Paternal Aunt        Breast   No Known Allergies  ? Current Facility-Administered Medications  Medication Dose Route Frequency Provider Last Rate Last Admin  . 0.9 %  sodium chloride infusion   Intravenous PRN Jennye Boroughs, MD   Stopped at 04/26/20 [phone]  . acetaminophen (TYLENOL) tablet 650 mg  650 mg Oral Q6H PRN Lenore Cordia, MD       Or  . acetaminophen (TYLENOL) suppository 650 mg  650 mg Rectal Q6H PRN Zada Finders R, MD      . allopurinol (ZYLOPRIM)  tablet 150 mg  150 mg Oral Daily Lenore Cordia, MD   150 mg at 04/26/20 0837  . azithromycin (ZITHROMAX) 500 mg in sodium chloride 0.9 % 250 mL IVPB  500 mg Intravenous Q24H Lenore Cordia, MD   Stopped at 04/26/20 [phone]  . ceFEPIme (MAXIPIME) 2 g in sodium chloride 0.9 % 100 mL IVPB  2 g Intravenous Q12H Hart Robinsons A, RPH   Stopped at 04/26/20 1048  . finasteride (PROSCAR) tablet 5 mg  5 mg Oral Q supper Lenore Cordia, MD  5 mg at 04/26/20 1722  . folic acid (FOLVITE) tablet 1 mg  1 mg Oral Daily Zada Finders R, MD   1 mg at 04/26/20 0845  . heparin injection 5,000 Units  5,000 Units Subcutaneous Q8H Lenore Cordia, MD   5,000 Units at 04/26/20 1408  . ondansetron (ZOFRAN) tablet 4 mg  4 mg Oral Q6H PRN Lenore Cordia, MD       Or  . ondansetron (ZOFRAN) injection 4 mg  4 mg Intravenous Q6H PRN Zada Finders R, MD      . posaconazole (NOXAFIL) delayed-release tablet 300 mg  300 mg Oral Daily Zada Finders R, MD   300 mg at 04/26/20 0844  . sodium bicarbonate tablet 650 mg  650 mg Oral BID Lenore Cordia, MD   650 mg at 04/26/20 5027  . valACYclovir (VALTREX) tablet 500 mg  500 mg Oral Daily Zada Finders R, MD   500 mg at 04/26/20 0844  . vancomycin (VANCOCIN) IVPB 1000 mg/200 mL premix  1,000 mg Intravenous Q24H Ena Dawley, RPH   Stopped at 04/26/20 1307  . vitamin B-12 (CYANOCOBALAMIN) tablet 1,000 mcg  1,000 mcg Oral Daily Lenore Cordia, MD   1,000 mcg at 04/26/20 0844     Abtx:  Anti-infectives (From admission, onward)   Start     Dose/Rate Route Frequency Ordered Stop   04/26/20 1200  vancomycin (VANCOCIN) IVPB 1000 mg/200 mL premix     Discontinue     1,000 mg 200 mL/hr over 60 Minutes Intravenous Every 24 hours 04/26/20 0340     04/26/20 1000  posaconazole (NOXAFIL) delayed-release tablet 300 mg     Discontinue     300 mg Oral Daily 04/25/20 2216     04/26/20 1000  valACYclovir (VALTREX) tablet 500 mg     Discontinue     500 mg Oral Daily 04/25/20 2216     04/26/20  0800  azithromycin (ZITHROMAX) 500 mg in sodium chloride 0.9 % 250 mL IVPB     Discontinue     500 mg 250 mL/hr over 60 Minutes Intravenous Every 24 hours 04/25/20 2211     04/26/20 0800  ceFEPIme (MAXIPIME) 2 g in sodium chloride 0.9 % 100 mL IVPB     Discontinue     2 g 200 mL/hr over 30 Minutes Intravenous Every 12 hours 04/25/20 2313     04/25/20 1930  ceFEPIme (MAXIPIME) 2 g in sodium chloride 0.9 % 100 mL IVPB        2 g 200 mL/hr over 30 Minutes Intravenous  Once 04/25/20 1916 04/25/20 2009   04/25/20 1930  metroNIDAZOLE (FLAGYL) IVPB 500 mg        500 mg 100 mL/hr over 60 Minutes Intravenous  Once 04/25/20 1916 04/25/20 2140   04/25/20 1930  vancomycin (VANCOCIN) IVPB 1000 mg/200 mL premix        1,000 mg 200 mL/hr over 60 Minutes Intravenous  Once 04/25/20 1916 04/25/20 2139      REVIEW OF SYSTEMS:  NA Objective:  VITALS:  BP (!) 170/100   Pulse (!) 133   Temp 100.2 F (37.9 C) (Oral)   Resp (!) 28   Ht 6' (1.829 m)   Wt 74.8 kg   SpO2 98%   BMI 22.36 kg/m  PHYSICAL EXAM: limited examination General: Tachypnec, sedated, on NRB Head: Normocephalic, without obvious abnormality, atraumatic. Eyes: did not examine ENT cannot examine Neck: Supple,  Back: Did not examine' Lungs:B/l crepts Heart:  Tachycardia Abdomen: Soft, non-tender,not distended. Bowel sounds normal. No masses Extremities: erythematous plaque like lesions both knee/leg area Edema legs       Skin: as above Lymph: Cervical, supraclavicular normal. Neurologic: cannot be examined  Pertinent Labs Lab Results CBC    Component Value Date/Time   WBC 1.0 (LL) 04/26/2020 0508   RBC 2.45 (L) 04/26/2020 0508   HGB 6.9 (L) 04/26/2020 0508   HGB 19.3 (H) 08/24/2012 1632   HCT 21.0 (L) 04/26/2020 0508   HCT 59.8 (H) 08/24/2012 1632   PLT 347 04/26/2020 0508   PLT 551 (H) 08/24/2012 1632   MCV 85.7 04/26/2020 0508   MCV 84 08/24/2012 1632   MCH 28.2 04/26/2020 0508   MCHC 32.9 04/26/2020 0508     RDW 20.3 (H) 04/26/2020 0508   RDW 18.2 (H) 08/24/2012 1632   LYMPHSABS 0.4 (L) 04/26/2020 0508   MONOABS 0.3 04/26/2020 0508   EOSABS 0.0 04/26/2020 0508   BASOSABS 0.0 04/26/2020 0508    CMP Latest Ref Rng & Units 04/26/2020 04/25/2020 03/22/2020  Glucose 70 - 99 mg/dL 93 118(H) 97  BUN 8 - 23 mg/dL 31(H) 34(H) 41(H)  Creatinine 0.61 - 1.24 mg/dL 1.60(H) 1.84(H) 1.74(H)  Sodium 135 - 145 mmol/L 131(L) 127(L) 132(L)  Potassium 3.5 - 5.1 mmol/L 3.4(L) 3.8 4.1  Chloride 98 - 111 mmol/L 101 96(L) 101  CO2 22 - 32 mmol/L 22 20(L) 24  Calcium 8.9 - 10.3 mg/dL 8.2(L) 8.3(L) 8.4(L)  Total Protein 6.5 - 8.1 g/dL - 8.8(H) -  Total Bilirubin 0.3 - 1.2 mg/dL - 0.7 -  Alkaline Phos 38 - 126 U/L - 89 -  AST 15 - 41 U/L - 52(H) -  ALT 0 - 44 U/L - 13 -      Microbiology: Recent Results (from the past 240 hour(s))  Culture, blood (Routine x 2)     Status: None (Preliminary result)   Collection Time: 04/25/20  7:09 PM   Specimen: BLOOD  Result Value Ref Range Status   Specimen Description BLOOD BLOOD LEFT FOREARM  Final   Special Requests   Final    BOTTLES DRAWN AEROBIC AND ANAEROBIC Blood Culture adequate volume   Culture   Final    NO GROWTH < 12 HOURS Performed at [facility], [address] [place], [place] [address]    Report Status PENDING  Incomplete  Culture, blood (Routine x 2)     Status: None (Preliminary result)   Collection Time: 04/25/20  7:12 PM   Specimen: BLOOD  Result Value Ref Range Status   Specimen Description BLOOD BLOOD RIGHT FOREARM  Final   Special Requests   Final    BOTTLES DRAWN AEROBIC AND ANAEROBIC Blood Culture adequate volume   Culture   Final    NO GROWTH < 12 HOURS Performed at [facility], [address] [place], [place] [address]    Report Status PENDING  Incomplete  SARS Coronavirus 2 by RT PCR (hospital order, performed in [place] hospital lab) Nasopharyngeal Nasopharyngeal Swab     Status: None   Collection Time:  04/25/20  8:12 PM   Specimen: Nasopharyngeal Swab  Result Value Ref Range Status   SARS Coronavirus 2 NEGATIVE NEGATIVE Final    Comment: (NOTE) SARS-CoV-2 target nucleic acids are NOT DETECTED.  The SARS-CoV-2 RNA is generally detectable in upper and lower respiratory specimens during the acute phase of infection. The lowest concentration of SARS-CoV-2 viral copies this assay can detect is 250  copies / mL. A negative result does not preclude SARS-CoV-2 infection and should not be used as the sole basis for treatment or other patient management decisions.  A negative result may occur with improper specimen collection / handling, submission of specimen other than nasopharyngeal swab, presence of viral mutation(s) within the areas targeted by this assay, and inadequate number of viral copies (<250 copies / mL). A negative result must be combined with clinical observations, patient history, and epidemiological information.  Fact Sheet for Patients:   StrictlyIdeas.no  Fact Sheet for Healthcare Providers: BankingDealers.co.za  This test is not yet approved or  cleared by the [place] FDA and has been authorized for detection and/or diagnosis of SARS-CoV-2 by FDA under an Emergency Use Authorization (EUA).  This EUA will remain in effect (meaning this test can be used) for the duration of the COVID-19 declaration under Section 564(b)(1) of the Act, 21 U.S.C. section 360bbb-3(b)(1), unless the authorization is terminated or revoked sooner.  Performed at [facility], [address] [place], [place] [address]     IMAGING RESULTS: 04/26/20    I have personally reviewed the films ? Impression/Recommendation ? 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 TACO or TRALI ?? AML   CKD  Skin lesion -legs ?? AML   ? ___________________________________________________ Discussed with his nurse Note:  This document was prepared using Dragon voice recognition software and may include unintentional dictation errors.

## 2020-04-27 DIAGNOSIS — N1831 Chronic kidney disease, stage 3a: Secondary | ICD-10-CM

## 2020-04-27 LAB — CBC WITH DIFFERENTIAL/PLATELET
Abs Immature Granulocytes: 0.13 10*3/uL — ABNORMAL HIGH (ref 0.00–0.07)
Basophils Absolute: 0 10*3/uL (ref 0.0–0.1)
Basophils Relative: 2 %
Eosinophils Absolute: 0 10*3/uL (ref 0.0–0.5)
Eosinophils Relative: 0 %
HCT: 26.4 % — ABNORMAL LOW (ref 39.0–52.0)
Hemoglobin: 8.7 g/dL — ABNORMAL LOW (ref 13.0–17.0)
Immature Granulocytes: 10 %
Lymphocytes Relative: 51 %
Lymphs Abs: 0.7 10*3/uL (ref 0.7–4.0)
MCH: 28.2 pg (ref 26.0–34.0)
MCHC: 33 g/dL (ref 30.0–36.0)
MCV: 85.4 fL (ref 80.0–100.0)
Monocytes Absolute: 0.1 10*3/uL (ref 0.1–1.0)
Monocytes Relative: 9 %
Neutro Abs: 0.4 10*3/uL — ABNORMAL LOW (ref 1.7–7.7)
Neutrophils Relative %: 28 %
Platelets: 412 10*3/uL — ABNORMAL HIGH (ref 150–400)
RBC: 3.09 MIL/uL — ABNORMAL LOW (ref 4.22–5.81)
RDW: 19.9 % — ABNORMAL HIGH (ref 11.5–15.5)
WBC: 1.3 10*3/uL — CL (ref 4.0–10.5)
nRBC: 7.6 % — ABNORMAL HIGH (ref 0.0–0.2)

## 2020-04-27 LAB — TYPE AND SCREEN
ABO/RH(D): O POS
Antibody Screen: NEGATIVE
Unit division: 0

## 2020-04-27 LAB — BASIC METABOLIC PANEL
Anion gap: 11 (ref 5–15)
BUN: 37 mg/dL — ABNORMAL HIGH (ref 8–23)
CO2: 19 mmol/L — ABNORMAL LOW (ref 22–32)
Calcium: 8 mg/dL — ABNORMAL LOW (ref 8.9–10.3)
Chloride: 101 mmol/L (ref 98–111)
Creatinine, Ser: 1.88 mg/dL — ABNORMAL HIGH (ref 0.61–1.24)
GFR calc Af Amer: 40 mL/min — ABNORMAL LOW (ref >60)
GFR calc non Af Amer: 35 mL/min — ABNORMAL LOW (ref >60)
Glucose, Bld: 117 mg/dL — ABNORMAL HIGH (ref 70–99)
Potassium: 3.9 mmol/L (ref 3.5–5.1)
Sodium: 131 mmol/L — ABNORMAL LOW (ref 135–145)

## 2020-04-27 LAB — LACTIC ACID, PLASMA: Lactic Acid, Venous: 2.4 mmol/L (ref 0.5–1.9)

## 2020-04-27 LAB — BPAM RBC
Blood Product Expiration Date: 202108212359
ISSUE DATE / TIME: 202108051138
Unit Type and Rh: 5100

## 2020-04-27 LAB — TROPONIN I (HIGH SENSITIVITY)
Troponin I (High Sensitivity): 368 ng/L (ref <18)
Troponin I (High Sensitivity): 147 ng/L (ref <18)

## 2020-04-27 LAB — PHOSPHORUS: Phosphorus: 4.7 mg/dL — ABNORMAL HIGH (ref 2.5–4.6)

## 2020-04-27 LAB — MAGNESIUM: Magnesium: 1.7 mg/dL (ref 1.7–2.4)

## 2020-04-27 LAB — CRYPTOCOCCAL ANTIGEN: Crypto Ag: NEGATIVE

## 2020-04-27 LAB — PROCALCITONIN: Procalcitonin: 21.82 ng/mL

## 2020-04-27 LAB — BRAIN NATRIURETIC PEPTIDE: B Natriuretic Peptide: 460.3 pg/mL — ABNORMAL HIGH (ref 0.0–100.0)

## 2020-04-27 MED ORDER — LATANOPROST 0.005 % OP SOLN
1.0000 [drp] | Freq: Every day | OPHTHALMIC | Status: DC
  Administered 2020-04-29 (×2): 1 [drp] via OPHTHALMIC
  Filled 2020-04-27 (×2): qty 2.5

## 2020-04-27 MED ORDER — SODIUM CHLORIDE 0.9 % IV SOLN
25.0000 ug/min | INTRAVENOUS | Status: DC
  Filled 2020-04-27: qty 1

## 2020-04-27 MED ORDER — SODIUM CHLORIDE 0.9 % IV SOLN
0.0000 ug/min | INTRAVENOUS | Status: DC
  Filled 2020-04-27: qty 1

## 2020-04-27 MED ORDER — SODIUM CHLORIDE 0.9 % IV SOLN: 250.0000 mL | INTRAVENOUS | Status: DC

## 2020-04-27 MED ORDER — TIMOLOL MALEATE 0.5 % OP SOLN
1.0000 [drp] | Freq: Every day | OPHTHALMIC | Status: DC
  Administered 2020-04-28 – 2020-04-30 (×3): 1 [drp] via OPHTHALMIC
  Filled 2020-04-27: qty 5

## 2020-04-27 MED ORDER — MIDODRINE HCL 5 MG PO TABS
5.0000 mg | ORAL_TABLET | Freq: Three times a day (TID) | ORAL | Status: DC
  Administered 2020-04-27 – 2020-04-30 (×10): 5 mg via ORAL
  Filled 2020-04-27 (×10): qty 1

## 2020-04-27 MED ORDER — SODIUM CHLORIDE 0.9% FLUSH
3.0000 mL | Freq: Two times a day (BID) | INTRAVENOUS | Status: DC
  Administered 2020-04-27 – 2020-04-30 (×5): 3 mL via INTRAVENOUS

## 2020-04-27 NOTE — Unmapped (Signed)
Patients wife contacted the [facility] today to cancel all patients appts that are scheduled for 04-30-20 due to patient being in intensive care at [facility] [place]. Contact phone number is [phone].     .Thank you,   Noland Fordyce  [facility]  [phone]

## 2020-04-27 NOTE — Progress Notes (Signed)
Pharmacy Antibiotic Note  Ruben [name] is a 74 y.o. male admitted on 04/25/2020 with pneumonia.  Pharmacy has been consulted for Cefepime and Vancomycin dosing.  Plan: Continue cefepime 2gm IV q12hrs  Continue vancomycin 1000 mg IV Q 24 hrs  ID following patient. Follow up recommendations.  Height: 6' (182.9 cm) Weight: 76.4 kg (168 lb 6.9 oz) IBW/kg (Calculated) : 77.6  Temp (24hrs), Avg:98.6 F (37 C), Min:97.7 F (36.5 C), Max:100.2 F (37.9 C)  Recent Labs  Lab 04/25/20 1909 04/25/20 2057 04/26/20 0508 04/26/20 2202 04/27/20 0054  WBC 1.3*  --  1.0* 2.6* 1.3*  CREATININE 1.84*  --  1.60* 1.80* 1.88*  LATICACIDVEN 1.9 2.4* 1.1 4.3* 2.4*    Estimated Creatinine Clearance: 37.8 mL/min (A) (by C-G formula based on SCr of 1.88 mg/dL (H)).    No Known Allergies  Antimicrobials this admission: Vancomycin 8/5 >> Cefepime 8/5 >> Azithromycin 8/5 >>   Microbiology results: 8/4 BCx: NGTD 8/4 UCx: NGTD  8/5 MRSA PCR: negative  Thank you for allowing pharmacy to be a part of this patient's care.  Tawnya Crook, PharmD 04/27/2020 3:48 PM

## 2020-04-27 NOTE — Progress Notes (Signed)
ID Out of ICU Feeling better Breathing much better No fever Awake and alert Patient Vitals for the past 24 hrs:  BP Temp Temp src Pulse Resp SpO2 Weight  04/27/20 2027 124/78 98.3 F (36.8 C) Oral (!) 109 -- 100 % --  04/27/20 1415 102/66 97.7 F (36.5 C) Oral (!) 104 18 100 % --  04/27/20 1200 104/74 98.7 F (37.1 C) Oral (!) 104 (!) 24 100 % --  04/27/20 1100 90/67 -- -- (!) 104 (!) 24 100 % --  04/27/20 1000 104/69 -- -- (!) 110 (!) 21 100 % --  04/27/20 0900 115/79 97.8 F (36.6 C) Oral (!) 109 (!) 24 100 % --  04/27/20 0800 115/83 -- -- (!) 109 18 100 % --  04/27/20 0701 -- -- -- -- -- 100 % --  04/27/20 0700 110/77 -- -- (!) 109 (!) 23 100 % --  04/27/20 0600 109/73 -- -- (!) 110 18 100 % --  04/27/20 0530 107/72 -- -- (!) 109 (!) 22 100 % --  04/27/20 0523 -- -- -- (!) 110 (!) 23 100 % --  04/27/20 0430 110/83 -- -- (!) 110 (!) 25 100 % --  04/27/20 0400 104/71 -- -- (!) 113 (!) 23 100 % --  04/27/20 0330 102/66 -- -- (!) 112 (!) 27 100 % --  04/27/20 0315 98/63 -- -- (!) 116 (!) 26 100 % --  04/27/20 0300 92/63 -- -- (!) 107 17 100 % --  04/27/20 0245 (!) 99/58 -- -- (!) 110 (!) 26 100 % --  04/27/20 0230 (!) 95/59 -- -- (!) 110 (!) 26 99 % --  04/27/20 0215 (!) 93/57 -- -- (!) 112 (!) 26 100 % --  04/27/20 0200 (!) 92/59 99.1 F (37.3 C) Axillary (!) 113 (!) 26 100 % --  04/27/20 0145 (!) 93/59 -- -- (!) 116 (!) 27 99 % --  04/27/20 0130 (!) 90/57 -- -- (!) 115 (!) 29 100 % --  04/27/20 0115 (!) 86/57 -- -- (!) 116 (!) 30 99 % --  04/27/20 0100 (!) 90/56 -- -- (!) 119 (!) 26 100 % --  04/27/20 0045 (!) 89/57 -- -- (!) 119 (!) 29 100 % --  04/27/20 0030 (!) 88/58 -- -- (!) 120 (!) 27 100 % --  04/27/20 0015 (!) 85/58 -- -- (!) 120 (!) 26 100 % --  04/27/20 0000 (!) 88/55 -- -- (!) 120 (!) 27 100 % --  04/26/20 2345 (!) 88/59 -- -- (!) 121 (!) 26 100 % --  04/26/20 2330 96/63 -- -- (!) 122 (!) 29 100 % --  04/26/20 2300 101/62 -- -- (!) 122 (!) 28 99 % --  04/26/20  2230 100/63 -- -- (!) 124 (!) 31 99 % --  04/26/20 2200 107/66 -- -- (!) 131 (!) 35 94 % --  04/26/20 2137 122/84 97.8 F (36.6 C) Axillary -- -- -- 76.4 kg    Chest b/l air entry crepts bases HS s12 Abd soft CNS non focal   Lab  CBC Latest Ref Rng & Units 04/27/2020 04/26/2020 04/26/2020  WBC 4.0 - 10.5 K/uL 1.3(LL) 2.6(L) 1.0(LL)  Hemoglobin 13.0 - 17.0 g/dL 8.7(L) 9.1(L) 6.9(L)  Hematocrit 39 - 52 % 26.4(L) 28.0(L) 21.0(L)  Platelets 150 - 400 K/uL 412(H) 526(H) 347    CMP Latest Ref Rng & Units 04/27/2020 04/26/2020 04/26/2020  Glucose 70 - 99 mg/dL 117(H) 189(H) 93  BUN 8 - 23 mg/dL  37(H) 37(H) 31(H)  Creatinine 0.61 - 1.24 mg/dL 1.88(H) 1.80(H) 1.60(H)  Sodium 135 - 145 mmol/L 131(L) 130(L) 131(L)  Potassium 3.5 - 5.1 mmol/L 3.9 4.0 3.4(L)  Chloride 98 - 111 mmol/L 101 101 101  CO2 22 - 32 mmol/L 19(L) 17(L) 22  Calcium 8.9 - 10.3 mg/dL 8.0(L) 7.9(L) 8.2(L)  Total Protein 6.5 - 8.1 g/dL - - -  Total Bilirubin 0.3 - 1.2 mg/dL - - -  Alkaline Phos 38 - 126 U/L - - -  AST 15 - 41 U/L - - -  ALT 0 - 44 U/L - - -    Micro Impression/Recommendation ? 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 AML Discussed the management with the patient  ID will follow him peripherally this weekend-

## 2020-04-27 NOTE — Progress Notes (Addendum)
    BRIEF OVERNIGHT PROGRESS REPORT    SUBJECTIVE: Per RN report, patient developed SOB and exhibiting signs of increased work of breathing with accessory muscle use, paradoxical movement of the abdomen and supraclavicular and intercostal retraction. He was also tachypneic, tachycardic and hypertensive with BP in the 170s.   OBJECTIVE: On arrival to the bedside, he was febrile (temp 100.2) with blood pressure 170/100 mm Hg and pulse rate 133 beats/min. There were no focal neurological deficits; he was alert and oriented x3 . Noted with increased work of breathing, SOB,  + Crackles bilaterally, increased jugular venous distension with signs of poor organ dysfunction.  ASSESSMENT/PLAN:  Acute Hypoxic Respiratory Failure secondary to Acute pulmonary edema/volume overload and pneumonia. Concerns for development of flush pulmonary edema following blood transfusion? -Transfer to stepdown -Supplemental O2 as needed to maintain O2 saturations 88 to 92% -BiPAP, wean as tolerated -Follow intermittent ABG and chest x-ray as needed -Repeat CXR  with worsening vascular congestion;currentlycrackles noted upon auscultation with increased JVD. -IV Lasix  40 mgx 1 -Morphine 15m x 1 -Repeat labs CBC, CMP, Troponin, Lactic acid and BNP. 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 Tmax: 101.1, ANC 400, Last Chemotherapy: currently day 26 s/p cycle #1 azacitidine and venetoclax, prior Abx prophylaxis: Levaquin -Neutropenic precautions -Follow-up blood+urine cultures -Empiric anti-viral (valtrex) -Empiric anti-fungal (Posaconazole) -Empiric abx with vanco/cefepime/azithro also -Chest x-ray shows as bove -Trend Lactic acid -Monitor fever curve -Vasoactive agent to keep MAP>630mg or SBP >9054m -ID following  Acute on CKD Stage III Metabolic  acidosis -Will give 1 amp of Bicarb -Monitor I&O's / urinary output -Follow BMP -Ensure adequate renal perfusion -Avoid nephrotoxic agents as able -Replace electrolytes as indicated       EliRufina FalcoSN, MSN, DNP, CCRN,FNP-C  [facility] Nurse Practitioner  [facility]

## 2020-04-27 NOTE — Progress Notes (Signed)
   04/27/20 1235  Clinical Encounter Type  Visited With Family  Visit Type Follow-up  Referral From Chaplain  Consult/Referral To Roslyn met patient's wife several times in the hall. Wife seem to be concerned about her husband's care and she felt bad about asking questions. Chaplain encourage patient's wife to ask questions and not feel bad about it. The last time chaplain saw her in the hall, she was moving her husbands things to room 258. Once chaplain and patient's wife reach his new room, patient's wife stated talking and sharing how she was feeling. Patient's wife seem to be overwhelmed and has a lot on her plate. While talking to chaplain patient's wife cried several times. Once while they were talking, patient's wife asked chaplain if she would give her a hug. Chaplain hugged patient's wife and prayed with her. Patient's wife said that God sent chaplain to her. Chaplain encouraged patient's wife to do self-care. She said that she needs her hair cut. Chaplain encouraged her to go and get her hair cut and even go to lunch. Patient's wife said that she has cancelled her appointments to take care of her husband. Chaplain explained the importance of her taking care of herself. Chaplain will follow up with patient and his wife at a later time.

## 2020-04-27 NOTE — Progress Notes (Signed)
[facility] Hematology/Oncology Progress Note  Date of admission: 04/25/2020  Hospital day:  04/27/2020  Chief Complaint: Ruben [name] is a 74 y.o. 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   Subjective: Feels better than yesterday.  Fatigued.  No diarrhea.  Social History: The patient is alone today.  Allergies: No Known Allergies  Scheduled Medications: . allopurinol  150 mg Oral Daily  . Chlorhexidine Gluconate Cloth  6 each Topical Daily  . finasteride  5 mg Oral Q supper  . folic acid  1 mg Oral Daily  . heparin  5,000 Units Subcutaneous Q8H  . midodrine  5 mg Oral TID WC  . posaconazole  300 mg Oral Daily  . sodium bicarbonate  650 mg Oral BID  . valACYclovir  500 mg Oral Daily  . vitamin B-12  1,000 mcg Oral Daily    Review of Systems: GENERAL:  [place].  Tmax 100.2 in past 24 hours.  Weight loss. PERFORMANCE STATUS (ECOG):  1-2 HEENT:  No visual changes, runny nose, sore throat, mouth sores or tenderness. Lungs: Shortness of breath, improved.  Chronic cough.  No hemoptysis. Cardiac:  No chest pain, palpitations, orthopnea, or PND. GI: No nausea, vomiting, diarrhea (resolved), constipation, melena or hematochezia. GU:   Foley catheter in place.  No urgency, frequency, dysuria, or hematuria. Musculoskeletal:  No back pain.  No joint pain.  No muscle tenderness. Extremities:  No pain or swelling. Skin:  No rashes or skin changes. Neuro:  No headache, numbness or weakness, balance or coordination issues. Endocrine:  No diabetes, thyroid issues, hot flashes or night sweats. Psych:  No mood changes, depression or anxiety. Pain:  No focal pain. Review of systems:  All other systems reviewed and found to be negative.  Physical Exam: Blood pressure 124/78, pulse (!) 109, temperature 98.3 F (36.8 C),  temperature source Oral, resp. rate 18, height 6' (1.829 m), weight 168 lb 6.9 oz (76.4 kg), SpO2 100 %.  GENERAL:  Slightly fatigued appearing gentleman sitting comfortably on the medical room in no acute distress. MENTAL STATUS:  Alert and oriented to person, place and time. HEAD:  Short brown hair.  Normocephalic, atraumatic, face symmetric, no Cushingoid features. EYES:  Glasses.  Blue eyes.  Pupils equal round and reactive to light and accomodation.  No conjunctivitis or scleral icterus. ENT:  Oropharynx clear without lesion.  Tongue normal. Mucous membranes moist.  RESPIRATORY:  Clear to auscultation without rales, wheezes or rhonchi. CARDIOVASCULAR:  Regular rate and rhythm without murmur, rub or gallop. ABDOMEN:  Soft, non-tender, with active bowel sounds, and no hepatosplenomegaly.  No masses. SKIN:  No rashes, ulcers or lesions. EXTREMITIES: 1+ lower extremity edema.  No skin discoloration or tenderness.  No palpable cords. LYMPH NODES: No palpable cervical, supraclavicular, axillary or inguinal adenopathy  NEUROLOGICAL: Unremarkable. PSYCH:  Appropriate.   Results for orders placed or performed during the hospital encounter of 04/25/20 (from the past 48 hour(s))  Procalcitonin     Status: None   Collection Time: 04/26/20  5:08 AM  Result Value Ref Range   Procalcitonin 5.06 ng/mL    Comment:        Interpretation: PCT > 2 ng/mL: Systemic infection (sepsis) is likely, unless other causes are known. (NOTE)       Sepsis PCT Algorithm  Lower Respiratory Tract                                      Infection PCT Algorithm    ----------------------------     ----------------------------         PCT < 0.25 ng/mL                PCT < 0.10 ng/mL          Strongly encourage             Strongly discourage   discontinuation of antibiotics    initiation of antibiotics    ----------------------------     -----------------------------       PCT 0.25 - 0.50 ng/mL            PCT  0.10 - 0.25 ng/mL               OR       >80% decrease in PCT            Discourage initiation of                                            antibiotics      Encourage discontinuation           of antibiotics    ----------------------------     -----------------------------         PCT >= 0.50 ng/mL              PCT 0.26 - 0.50 ng/mL               AND       <80% decrease in PCT              Encourage initiation of                                             antibiotics       Encourage continuation           of antibiotics    ----------------------------     -----------------------------        PCT >= 0.50 ng/mL                  PCT > 0.50 ng/mL               AND         increase in PCT                  Strongly encourage                                      initiation of antibiotics    Strongly encourage escalation           of antibiotics                                     -----------------------------  PCT <= 0.25 ng/mL                                                 OR                                        > 80% decrease in PCT                                      Discontinue / Do not initiate                                             antibiotics  Performed at [facility], [address] [place], [place] [address]   Protime-INR     Status: Abnormal   Collection Time: 04/26/20  5:08 AM  Result Value Ref Range   Prothrombin Time 17.7 (H) 11.4 - 15.2 seconds   INR 1.5 (H) 0.8 - 1.2    Comment: (NOTE) INR goal varies based on device and disease states. Performed at [facility], [address] [place], [place] [address]   CBC WITH DIFFERENTIAL     Status: Abnormal   Collection Time: 04/26/20  5:08 AM  Result Value Ref Range   WBC 1.0 (LL) 4.0 - 10.5 K/uL    Comment: CRITICAL VALUE NOTED.  VALUE IS CONSISTENT WITH PREVIOUSLY REPORTED AND CALLED VALUE.   RBC 2.45 (L) 4.22 - 5.81 MIL/uL   Hemoglobin 6.9 (L)  13.0 - 17.0 g/dL   HCT 21.0 (L) 39 - 52 %   MCV 85.7 80.0 - 100.0 fL   MCH 28.2 26.0 - 34.0 pg   MCHC 32.9 30.0 - 36.0 g/dL   RDW 20.3 (H) 11.5 - 15.5 %   Platelets 347 150 - 400 K/uL   nRBC 2.0 (H) 0.0 - 0.2 %   Neutrophils Relative % 32 %   Neutro Abs 0.3 (L) 1.7 - 7.7 K/uL   Lymphocytes Relative 38 %   Lymphs Abs 0.4 (L) 0.7 - 4.0 K/uL   Monocytes Relative 29 %   Monocytes Absolute 0.3 0 - 1 K/uL   Eosinophils Relative 0 %   Eosinophils Absolute 0.0 0 - 0 K/uL   Basophils Relative 0 %   Basophils Absolute 0.0 0 - 0 K/uL   WBC Morphology MILD LEFT SHIFT (1-5% METAS, OCC MYELO, OCC BANDS)    RBC Morphology MIXED RBC POPULATION    Smear Review Normal platelet morphology    Immature Granulocytes 1 %   Abs Immature Granulocytes 0.01 0.00 - 0.07 K/uL    Comment: Performed at [facility], [address] [place],  [address]  Basic metabolic panel     Status: Abnormal   Collection Time: 04/26/20  5:08 AM  Result Value Ref Range   Sodium 131 (L) 135 - 145 mmol/L   Potassium 3.4 (L) 3.5 - 5.1 mmol/L   Chloride 101 98 - 111 mmol/L   CO2 22 22 - 32 mmol/L   Glucose, Bld 93 70 - 99 mg/dL  Comment: Glucose reference range applies only to samples taken after fasting for at least 8 hours.   BUN 31 (H) 8 - 23 mg/dL   Creatinine, Ser 1.60 (H) 0.61 - 1.24 mg/dL   Calcium 8.2 (L) 8.9 - 10.3 mg/dL   GFR calc non Af Amer 42 (L) >60 mL/min   GFR calc Af Amer 49 (L) >60 mL/min   Anion gap 8 5 - 15    Comment: Performed at [facility], [address] [place], [place] [address]  Lactic acid, plasma     Status: None   Collection Time: 04/26/20  5:08 AM  Result Value Ref Range   Lactic Acid, Venous 1.1 0.5 - 1.9 mmol/L    Comment: Performed at [facility], [address] [place], [place] [address]  ABO/Rh     Status: None   Collection Time: 04/26/20  5:08 AM  Result Value Ref Range   ABO/RH(D)      O POS Performed at [facility], [address], [place], [place] [address]   Magnesium     Status: Abnormal   Collection Time: 04/26/20  5:08 AM  Result Value Ref Range   Magnesium 1.6 (L) 1.7 - 2.4 mg/dL    Comment: Performed at [facility], [address] [place], [place] [address]  Phosphorus     Status: None   Collection Time: 04/26/20  5:08 AM  Result Value Ref Range   Phosphorus 4.2 2.5 - 4.6 mg/dL    Comment: Performed at [facility], [address], [place], [place] [address]  Prepare RBC (crossmatch)     Status: None   Collection Time: 04/26/20  8:18 AM  Result Value Ref Range   Order Confirmation      ORDER PROCESSED BY BLOOD BANK Performed at [facility], [address] [place], [place] [address]   Type and screen [place]     Status: None   Collection Time: 04/26/20  9:25 AM  Result Value Ref Range   ABO/RH(D) O POS    Antibody Screen NEG    Sample Expiration 04/29/2020,2359    Unit Number [id number]    Blood Component Type RBC, LR IRR    Unit division 00    Status of Unit ISSUED,FINAL    Transfusion Status OK TO TRANSFUSE    Crossmatch Result      Compatible Performed at [facility], [address] [place], [place] [address]   Blood gas, arterial     Status: Abnormal   Collection Time: 04/26/20  8:58 PM  Result Value Ref Range   FIO2 0.32    Delivery systems NASAL CANNULA    pH, Arterial 7.32 (L) 7.35 - 7.45   pCO2 arterial 30 (L) 32 - 48 mmHg   pO2, Arterial 57 (L) 83 - 108 mmHg   Bicarbonate 15.5 (L) 20.0 - 28.0 mmol/L   Acid-base deficit 9.5 (H) 0.0 - 2.0 mmol/L   O2 Saturation 86.6 %   Patient temperature 37.0    Collection site LEFT RADIAL    Sample type ARTERIAL DRAW    Allens test (pass/fail) PASS PASS    Comment: Performed at [facility], [address] [place], [place] [address]  Glucose, capillary     Status: Abnormal   Collection Time: 04/26/20  9:40 PM  Result Value Ref Range    Glucose-Capillary 179 (H) 70 - 99 mg/dL    Comment: Glucose reference range applies  only to samples taken after fasting for at least 8 hours.  MRSA PCR Screening     Status: None   Collection Time: 04/26/20  9:42 PM   Specimen: Nasopharyngeal  Result Value Ref Range   MRSA by PCR NEGATIVE NEGATIVE    Comment:        The GeneXpert MRSA Assay (FDA approved for NASAL specimens only), is one component of a comprehensive MRSA colonization surveillance program. It is not intended to diagnose MRSA infection nor to guide or monitor treatment for MRSA infections. Performed at [facility], [address] [place], [place] [address]   CBC with Differential/Platelet     Status: Abnormal   Collection Time: 04/26/20 10:02 PM  Result Value Ref Range   WBC 2.6 (L) 4.0 - 10.5 K/uL   RBC 3.20 (L) 4.22 - 5.81 MIL/uL   Hemoglobin 9.1 (L) 13.0 - 17.0 g/dL    Comment: REPEATED TO VERIFY   HCT 28.0 (L) 39 - 52 %   MCV 87.5 80.0 - 100.0 fL   MCH 28.4 26.0 - 34.0 pg   MCHC 32.5 30.0 - 36.0 g/dL   RDW 19.9 (H) 11.5 - 15.5 %   Platelets 526 (H) 150 - 400 K/uL   nRBC 7.7 (H) 0.0 - 0.2 %   Neutrophils Relative % 31 %   Neutro Abs 0.8 (L) 1.7 - 7.7 K/uL   Lymphocytes Relative 57 %   Lymphs Abs 1.5 0.7 - 4.0 K/uL   Monocytes Relative 10 %   Monocytes Absolute 0.3 0 - 1 K/uL   Eosinophils Relative 0 %   Eosinophils Absolute 0.0 0 - 0 K/uL   Basophils Relative 1 %   Basophils Absolute 0.0 0 - 0 K/uL   WBC Morphology      MARKED LEFT SHIFT (>5% METAS,MYELOS AND PROS, OCC BLAST NOTED)   Immature Granulocytes 1 %   Abs Immature Granulocytes 0.02 0.00 - 0.07 K/uL   Polychromasia PRESENT    Giant PLTs PRESENT     Comment: Performed at [facility], [address] [place], [place] [address]  Basic metabolic panel     Status: Abnormal   Collection Time: 04/26/20 10:02 PM  Result Value Ref Range   Sodium 130 (L) 135 - 145 mmol/L   Potassium 4.0 3.5 - 5.1 mmol/L   Chloride 101 98 -  111 mmol/L   CO2 17 (L) 22 - 32 mmol/L   Glucose, Bld 189 (H) 70 - 99 mg/dL    Comment: Glucose reference range applies only to samples taken after fasting for at least 8 hours.   BUN 37 (H) 8 - 23 mg/dL   Creatinine, Ser 1.80 (H) 0.61 - 1.24 mg/dL   Calcium 7.9 (L) 8.9 - 10.3 mg/dL   GFR calc non Af Amer 37 (L) >60 mL/min   GFR calc Af Amer 42 (L) >60 mL/min   Anion gap 12 5 - 15    Comment: Performed at [facility], [address]  [place], [place] [address]  Lactic acid, plasma     Status: Abnormal   Collection Time: 04/26/20 10:02 PM  Result Value Ref Range   Lactic Acid, Venous 4.3 (HH) 0.5 - 1.9 mmol/L    Comment: CRITICAL RESULT CALLED TO, READ BACK BY AND VERIFIED WITH [facility] OLEJAR AT 2247 ON 04/26/20 RWW Performed at [facility], [address] [place], [place] [address]   Troponin I (High Sensitivity)     Status: Abnormal  Collection Time: 04/26/20 10:02 PM  Result Value Ref Range   Troponin I (High Sensitivity) 80 (H) <18 ng/L    Comment: (NOTE) Elevated high sensitivity troponin I (hsTnI) values and significant  changes across serial measurements may suggest ACS but many other  chronic and acute conditions are known to elevate hsTnI results.  Refer to the "Links" section for chest pain algorithms and additional  guidance. Performed at [facility], [address] [place], [place] [address]   Procalcitonin     Status: None   Collection Time: 04/27/20 12:54 AM  Result Value Ref Range   Procalcitonin 21.82 ng/mL    Comment:        Interpretation: PCT >= 10 ng/mL: Important systemic inflammatory response, almost exclusively due to severe bacterial sepsis or septic shock. (NOTE)       Sepsis PCT Algorithm           Lower Respiratory Tract                                      Infection PCT Algorithm    ----------------------------     ----------------------------         PCT < 0.25 ng/mL                PCT < 0.10 ng/mL           Strongly encourage             Strongly discourage   discontinuation of antibiotics    initiation of antibiotics    ----------------------------     -----------------------------       PCT 0.25 - 0.50 ng/mL            PCT 0.10 - 0.25 ng/mL               OR       >80% decrease in PCT            Discourage initiation of                                            antibiotics      Encourage discontinuation           of antibiotics    ----------------------------     -----------------------------         PCT >= 0.50 ng/mL              PCT 0.26 - 0.50 ng/mL                AND       <80% decrease in PCT             Encourage initiation of                                             antibiotics       Encourage continuation           of antibiotics    ----------------------------     -----------------------------        PCT >= 0.50 ng/mL                  PCT >  0.50 ng/mL               AND         increase in PCT                  Strongly encourage                                      initiation of antibiotics    Strongly encourage escalation           of antibiotics                                     -----------------------------                                           PCT <= 0.25 ng/mL                                                 OR                                        > 80% decrease in PCT                                      Discontinue / Do not initiate                                             antibiotics  Performed at [facility], [address] [place], [place] [address]   CBC with Differential/Platelet     Status: Abnormal   Collection Time: 04/27/20 12:54 AM  Result Value Ref Range   WBC 1.3 (LL) 4.0 - 10.5 K/uL    Comment: CRITICAL VALUE NOTED.  VALUE IS CONSISTENT WITH PREVIOUSLY REPORTED AND CALLED VALUE. THIS CRITICAL RESULT HAS VERIFIED AND BEEN CALLED TO Mila Merry, RN BY PATRICIA CABELLERO ON 08 06 2021 AT 61, AND HAS BEEN READ BACK.     RBC  3.09 (L) 4.22 - 5.81 MIL/uL   Hemoglobin 8.7 (L) 13.0 - 17.0 g/dL   HCT 26.4 (L) 39 - 52 %   MCV 85.4 80.0 - 100.0 fL   MCH 28.2 26.0 - 34.0 pg   MCHC 33.0 30.0 - 36.0 g/dL   RDW 19.9 (H) 11.5 - 15.5 %   Platelets 412 (H) 150 - 400 K/uL   nRBC 7.6 (H) 0.0 - 0.2 %   Neutrophils Relative % 28 %   Neutro Abs 0.4 (L) 1.7 - 7.7 K/uL   Lymphocytes Relative 51 %   Lymphs Abs 0.7 0.7 - 4.0 K/uL   Monocytes Relative 9 %   Monocytes Absolute 0.1 0 - 1 K/uL   Eosinophils Relative 0 %   Eosinophils Absolute 0.0 0 - 0 K/uL  Basophils Relative 2 %   Basophils Absolute 0.0 0 - 0 K/uL   WBC Morphology      MARKED LEFT SHIFT (>5% METAS,MYELOS AND PROS, OCC BLAST NOTED)   Smear Review Reviewed    Immature Granulocytes 10 %   Abs Immature Granulocytes 0.13 (H) 0.00 - 0.07 K/uL   Abnormal Lymphocytes Present PRESENT    Schistocytes PRESENT    Polychromasia PRESENT    Giant PLTs PRESENT     Comment: Performed at [facility], [address], [place], [place] [address]  Basic metabolic panel     Status: Abnormal   Collection Time: 04/27/20 12:54 AM  Result Value Ref Range   Sodium 131 (L) 135 - 145 mmol/L   Potassium 3.9 3.5 - 5.1 mmol/L   Chloride 101 98 - 111 mmol/L   CO2 19 (L) 22 - 32 mmol/L   Glucose, Bld 117 (H) 70 - 99 mg/dL    Comment: Glucose reference range applies only to samples taken after fasting for at least 8 hours.   BUN 37 (H) 8 - 23 mg/dL   Creatinine, Ser 1.88 (H) 0.61 - 1.24 mg/dL   Calcium 8.0 (L) 8.9 - 10.3 mg/dL   GFR calc non Af Amer 35 (L) >60 mL/min   GFR calc Af Amer 40 (L) >60 mL/min   Anion gap 11 5 - 15    Comment: Performed at [facility], [address], [place], [place] [address]  Magnesium     Status: None   Collection Time: 04/27/20 12:54 AM  Result Value Ref Range   Magnesium 1.7 1.7 - 2.4 mg/dL    Comment: Performed at [facility], [address] [place], [place] [address]  Phosphorus     Status: Abnormal    Collection Time: 04/27/20 12:54 AM  Result Value Ref Range   Phosphorus 4.7 (H) 2.5 - 4.6 mg/dL    Comment: Performed at [facility], [address] [place], [place] [address]  Lactic acid, plasma     Status: Abnormal   Collection Time: 04/27/20 12:54 AM  Result Value Ref Range   Lactic Acid, Venous 2.4 (HH) 0.5 - 1.9 mmol/L    Comment: CRITICAL VALUE NOTED. VALUE IS CONSISTENT WITH PREVIOUSLY REPORTED/CALLED VALUE RWW Performed at [facility], [address] [place], [place] [address]   Cryptococcal antigen     Status: None   Collection Time: 04/27/20 12:54 AM  Result Value Ref Range   Crypto Ag NEGATIVE NEGATIVE   Cryptococcal Ag Titer NOT INDICATED NOT INDICATED    Comment: Performed at [facility] [address] [place], [place] [address]  Troponin I (High Sensitivity)     Status: Abnormal   Collection Time: 04/27/20 12:54 AM  Result Value Ref Range   Troponin I (High Sensitivity) 368 (HH) <18 ng/L    Comment: CRITICAL RESULT CALLED TO, READ BACK BY AND VERIFIED WITH SARAH OLEJAR AT 0225 ON 04/27/20 RWW (NOTE) Elevated high sensitivity troponin I (hsTnI) values and significant  changes across serial measurements may suggest ACS but many other  chronic and acute conditions are known to elevate hsTnI results.  Refer to the "Links" section for chest pain algorithms and additional  guidance. Performed at [facility], [address] [place],  [address]   Brain natriuretic peptide     Status: Abnormal   Collection Time: 04/27/20 12:54 AM  Result Value Ref Range   B Natriuretic Peptide 460.3 (H) 0.0 -  100.0 pg/mL    Comment: Performed at [facility], [place], [place] [address]  Troponin I (High Sensitivity)     Status: Abnormal   Collection Time: 04/27/20  6:06 PM  Result Value Ref Range   Troponin I (High Sensitivity) 147 (HH) <18 ng/L    Comment: CRITICAL VALUE NOTED. VALUE IS CONSISTENT WITH  PREVIOUSLY REPORTED/CALLED VALUE MJU (NOTE) Elevated high sensitivity troponin I (hsTnI) values and significant  changes across serial measurements may suggest ACS but many other  chronic and acute conditions are known to elevate hsTnI results.  Refer to the "Links" section for chest pain algorithms and additional  guidance. Performed at [facility], [address] [place], [place] [address]    DG Chest 1 View  Result Date: 04/26/2020 CLINICAL DATA:  Shortness of breath EXAM: CHEST  1 VIEW COMPARISON:  04/25/2020, CT 03/19/2020 FINDINGS: Stable cardiomediastinal silhouette. Interim development of vascular congestion and moderate diffuse bilateral interstitial opacities suspicious for pulmonary edema. More focal areas of airspace disease within the peripheral right greater than left lung suspicious for pneumonia. Possible tiny pleural effusions. No pneumothorax. IMPRESSION: Interim development of vascular congestion and moderate diffuse bilateral interstitial opacities suspicious for pulmonary edema. More focal areas of airspace disease within the peripheral right greater than left lung suspicious for pneumonia. Electronically Signed   By: Donavan Foil M.D.   On: 04/26/2020 21:33   ECHOCARDIOGRAM COMPLETE  Result Date: 04/26/2020    ECHOCARDIOGRAM REPORT   Patient Name:   MAVERICK DIEUDONNE [facility] Date of Exam: 04/26/2020 Medical Rec #:  [id number]       Height:       72.0 in Accession #:    [id number]      Weight:       164.9 lb Date of Birth:  [birth year]       BSA:          1.963 m Patient Age:    50 years        BP:           114/63 mmHg Patient Gender: M               HR:           91 bpm. Exam Location:  [facility] Procedure: 2D Echo, Color Doppler and Cardiac Doppler Indications:     R01.1 Murmur  History:         Patient has no prior history of Echocardiogram examinations.                  CKD; Arrythmias:RBBB.  Sonographer:     Charmayne Sheer RDCS (AE) Referring Phys:  [id number] Cleaster Corin PATEL Diagnosing  Phys: Kathlyn Sacramento MD IMPRESSIONS  1. Left ventricular ejection fraction, by estimation, is 55 to 60%. The left ventricle has normal function. The left ventricle has no regional wall motion abnormalities. Left ventricular diastolic parameters were normal.  2. Right ventricular systolic function is normal. The right ventricular size is normal. There is mildly elevated pulmonary artery systolic pressure.  3. Left atrial size was mild to moderately dilated.  4. The mitral valve is normal in structure. Mild to moderate mitral valve regurgitation. No evidence of mitral stenosis.  5. The aortic valve is abnormal. Aortic valve regurgitation is mild. Mild aortic valve stenosis. Aortic valve area, by VTI measures 1.82 cm. Aortic valve mean gradient measures 10.0 mmHg.  6. The inferior vena cava is normal in size with <50% respiratory variability, suggesting right  atrial pressure of 8 mmHg. FINDINGS  Left Ventricle: Left ventricular ejection fraction, by estimation, is 55 to 60%. The left ventricle has normal function. The left ventricle has no regional wall motion abnormalities. The left ventricular internal cavity size was normal in size. There is  no left ventricular hypertrophy. Left ventricular diastolic parameters were normal. Right Ventricle: The right ventricular size is normal. No increase in right ventricular wall thickness. Right ventricular systolic function is normal. There is mildly elevated pulmonary artery systolic pressure. The tricuspid regurgitant velocity is 2.97  m/s, and with an assumed right atrial pressure of 8 mmHg, the estimated right ventricular systolic pressure is 38.2 mmHg. Left Atrium: Left atrial size was mild to moderately dilated. Right Atrium: Right atrial size was normal in size. Pericardium: There is no evidence of pericardial effusion. Mitral Valve: The mitral valve is normal in structure. Normal mobility of the mitral valve leaflets. Mild to moderate mitral valve regurgitation. No  evidence of mitral valve stenosis. MV peak gradient, 4.8 mmHg. The mean mitral valve gradient is 2.0 mmHg. Tricuspid Valve: The tricuspid valve is normal in structure. Tricuspid valve regurgitation is mild . No evidence of tricuspid stenosis. Aortic Valve: The aortic valve is abnormal. Aortic valve regurgitation is mild. Aortic regurgitation PHT measures 550 msec. Mild aortic stenosis is present. Aortic valve mean gradient measures 10.0 mmHg. Aortic valve peak gradient measures 17.5 mmHg. Aortic valve area, by VTI measures 1.82 cm. Pulmonic Valve: The pulmonic valve was normal in structure. Pulmonic valve regurgitation is trivial. No evidence of pulmonic stenosis. Aorta: The aortic root is normal in size and structure. Venous: The inferior vena cava is normal in size with less than 50% respiratory variability, suggesting right atrial pressure of 8 mmHg. IAS/Shunts: No atrial level shunt detected by color flow Doppler.  LEFT VENTRICLE PLAX 2D LVIDd:         4.70 cm  Diastology LVIDs:         3.13 cm  LV e' lateral:   8.16 cm/s LV PW:         0.94 cm  LV E/e' lateral: 12.0 LV IVS:        0.89 cm  LV e' medial:    6.85 cm/s LVOT diam:     2.00 cm  LV E/e' medial:  14.3 LV SV:         70 LV SV Index:   36 LVOT Area:     3.14 cm  RIGHT VENTRICLE RV Basal diam:  3.68 cm LEFT ATRIUM             Index       RIGHT ATRIUM           Index LA diam:        4.30 cm 2.19 cm/m  RA Area:     16.80 cm LA Vol (A2C):   66.6 ml 33.93 ml/m RA Volume:   41.10 ml  20.94 ml/m LA Vol (A4C):   85.0 ml 43.31 ml/m LA Biplane Vol: 78.9 ml 40.20 ml/m  AORTIC VALVE                    PULMONIC VALVE AV Area (Vmax):    1.68 cm     PV Vmax:       1.56 m/s AV Area (Vmean):   1.71 cm     PV Vmean:      102.000 cm/s AV Area (VTI):     1.82 cm     PV  VTI:        0.267 m AV Vmax:           209.33 cm/s  PV Peak grad:  9.7 mmHg AV Vmean:          148.000 cm/s PV Mean grad:  5.0 mmHg AV VTI:            0.386 m AV Peak Grad:      17.5 mmHg AV Mean  Grad:      10.0 mmHg LVOT Vmax:         112.00 cm/s LVOT Vmean:        80.700 cm/s LVOT VTI:          0.223 m LVOT/AV VTI ratio: 0.58 AI PHT:            550 msec  AORTA Ao Root diam: 3.60 cm MITRAL VALVE               TRICUSPID VALVE MV Area (PHT): 5.54 cm    TR Peak grad:   35.3 mmHg MV Peak grad:  4.8 mmHg    TR Vmax:        297.00 cm/s MV Mean grad:  2.0 mmHg MV Vmax:       1.10 m/s    SHUNTS MV Vmean:      70.4 cm/s   Systemic VTI:  0.22 m MV Decel Time: 137 msec    Systemic Diam: 2.00 cm MV E velocity: 97.70 cm/s MV A velocity: 59.10 cm/s MV E/A ratio:  1.65 Kathlyn Sacramento MD Electronically signed by Kathlyn Sacramento MD Signature Date/Time: 04/26/2020/12:15:51 PM    Final     Assessment:  KANIN LIA is a 73 y.o. 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  He was admitted with fever and neutropenia.  He has had a history of pneumonia since 02/2020.  Chest x-ray on 04/26/2020 revealed interval development of vascular congestion and moderate diffuse bilateral interstitial opacities suspicious for pulmonary edema.  There are more focal areas of airspace disease within the peripheral right> left lung suspicious for pneumonia   He has Crohn's colitis.  Symptomatically, he feels better today.  He was transferred briefly to the ICU yesterday secondary to acute hypoxic respiratory failure secondary to acute diastolic CHF.  Plan:   1.   Acute myelogenous leukemia             Patient transformed from polycythemia rubra vera.             He is day 27 of cycle #1 azacitidine + Venetoclax.             Chemotherapy remains on hold.             Phone follow-up with Dr Evelene Croon regarding plan for outpatient bone marrow next week.  Anticipate good response in marrow based on counts. 2.   Anemia and leukopenia             Hematocrit 26.4.  Hemoglobin 8.7.  MCV 85.4, platelets 412,000.  WBC 1300 with an ANC of 400.  Etiology  secondary to marrow replacement with AML and treatment with chemotherapy.             Maintain active type and screen.  Transfuse to maintain Hgb > 8.0.             ALL products leukopoor and irradiated.  Continue neutropenic precautions.             CBC with differential daily. 3.   Fever and neutropenia             Patient has had infiltrates prior to initiation of induction chemotherapy.             CXR at [facility] showed improvement.             Continue broad spectrum antibiotics (Cefepime, vancomycin, azithromycin, and valacyclovir).             Blood culture q 24 hours prn temp>= 100.4    4.   Interval decompensation  Etiology felt secondary to acute diastolic CHF.  Patient on central telemetry. 5.   Diarrhea             Frequent loose stools have resolved.             Etiology possibly related to Crohn's.             Continue to monitor. 6.   Code status             Partial code per chart.    Lequita Asal, MD  04/27/2020, 9:41 PM

## 2020-04-27 NOTE — Consult Note (Signed)
Name: Ruben [name] MRN: [id number] DOB: 02/12/1946    ADMISSION DATE:  04/25/2020 CONSULTATION DATE:  04/27/2020   REFERRING MD :  Rufina Falco, NP  CHIEF COMPLAINT:  Acute Respiratory Distress, Hypotension  BRIEF PATIENT DESCRIPTION:  74 y.o. 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  On 8/5 developed acute respiratory distress due to pulmonary edema requiring transfer to [facility] for BiPAP.  SIGNIFICANT EVENTS  8/4: Presented to ED; admission to [place] 8/5: ID & Oncology consulted 8/5: Acute Respiratory Distress due to pulmonary edema, tx to Stepdown for BiPAP; PCCM consulted  STUDIES:  8/4: CXR>>Patchy bilateral peripheral pulmonary infiltrates right greater than left concerning for bilateral pneumonia, possible atypical or viral Pneumonia. 8/5: CXR>>Interim development of vascular congestion and moderate diffuse bilateral interstitial opacities suspicious for pulmonary edema. More focal areas of airspace disease within the peripheral right greater than left lung suspicious for pneumonia.  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  He reported several days of increased lethargy, dry cough, hot and cold spells, and frequent loose stools.  On 04/25/2020 he developed  new fever at home which oncology recommended he seek further evaluation in the ED.  He has been being treated for pneumonia since 02/2020.  Upon presentation to the ED, vitals included BP 147/63, pulse 114, respiratory rate 24, temperature 104.1 0.1, SPO2 100% on room air.  Initial work-up in the ED revealed WBC 1.3, ANC 400, hemoglobin 8.4, platelets 482, lactic acid 1.9, sodium 127, Creatinine 1.84. COVID-19 PCR is negative.  Urinalysis is negative.  Chest x-ray revealed patchy bilateral pulmonary infiltrates with the right greater than the left.  He met sepsis criteria therefore he received IV fluid resuscitation and empiric antibiotics with IV vancomycin, cefepime, Flagyl.  He was admitted to the [place] unit by the hospitalist for further work-up and treatment of sepsis and neutropenic fever in the setting of pneumonia.  Oncology and infectious diseases were consulted.  On 8/5 he required transfusion of 1 unit of packed red blood cells due to severe anemia.  Late in the evening on 04/26/2020 he developed acute respiratory distress, tachycardia, and hypertension.  He was noted to have coarse crackles bilaterally and increased jugular venous distention.  Chest x-ray was concerning for development of pulmonary edema.  He was given 40 mg IV Lasix and 2 mg IV morphine, and transferred to stepdown unit for BiPAP.  Post Lasix and Ativan administration he is noted to be hypotensive.   PCCM is consulted for further work-up and treatment of acute respiratory distress in the setting of pulmonary edema and pneumonia, along with hypotension.    PAST MEDICAL HISTORY :   has a past medical history of Blood dyscrasia, BPH (benign prostatic hyperplasia), Cancer ([place]), Chronic kidney disease, Crohn disease ([place]), Crohn's disease ([place]), Glaucoma, Glaucoma, Gout, History of chicken pox, Myelofibrosis ([place]), Nosebleed, RBBB, and Right bundle branch block.  has a past surgical history that includes Skin Lesion Basal cell  removed; wart removal; Prostate surgery (2008); Colonoscopy with propofol (N/A, 03/27/2017); Cataract extraction w/PHACO (Left, 07/13/2018); Mohs surgery; and Cataract extraction w/PHACO (Right, 08/03/2018). Prior to Admission medications   Medication Sig Start Date End Date Taking? Authorizing Provider  acetaminophen (TYLENOL) 500 MG tablet Take 500 mg by mouth daily as needed for moderate pain.   Yes [provider]  allopurinol (ZYLOPRIM) 300 MG tablet TAKE 1/2 TABLET (150 MG TOTAL) BY MOUTH DAILY. 12/05/19  Yes Corcoran, Drue Second, MD  Cyanocobalamin (RA VITAMIN B-12 TR) 1000 MCG TBCR Take 1,000 mcg by mouth daily at 12 noon.    Yes [provider]  finasteride (PROSCAR) 5 MG tablet Take 1 tablet (5 mg total) by mouth every evening. 11/08/19  Yes Hollice Espy, MD  folic acid (FOLVITE) 1 MG tablet Take 1 mg by mouth daily at 12 noon.  03/20/15  Yes [provider]  latanoprost (XALATAN) 0.005 % ophthalmic solution Place 1 drop into both eyes at bedtime.    Yes [provider]  levofloxacin (LEVAQUIN) 250 MG tablet Take 250 mg by mouth daily.   Yes [provider]  POSACONAZOLE PO Take 300 mg by mouth daily.   Yes [provider]  sodium bicarbonate 650 MG tablet Take 650 mg by mouth 2 (two) times daily. 06/06/17  Yes [provider]  timolol (TIMOPTIC) 0.5 % ophthalmic solution Place 1 drop into both eyes daily. In AM 05/27/18  Yes [provider]  tranexamic acid (LYSTEDA) 650 MG TABS tablet Take 650 mg by mouth 2 (two) times daily.   Yes [provider]  valACYclovir (VALTREX) 500 MG tablet Take 500 mg by mouth daily.   Yes [provider]  venetoclax 100 MG TABS Take 100 mg by mouth daily.  03/06/20  Yes [provider]  albuterol (VENTOLIN HFA) 108 (90 Base) MCG/ACT inhaler Inhale 2 puffs into the lungs every 6 (six) hours as needed for wheezing or shortness of breath. Patient not taking: Reported on  04/25/2020 02/29/20   Jacquelin Hawking, NP   No Known Allergies  FAMILY HISTORY:  family history includes Cancer in his father, mother, paternal aunt, and sister. SOCIAL HISTORY:  reports that he quit smoking about 16 years ago. His smoking use included cigarettes. He has a 5.00 pack-year smoking history. He has never used smokeless tobacco. He reports previous alcohol use of about 4.0 - 5.0 standard drinks of alcohol per week. He reports that he does not use drugs.   COVID-19 DISASTER DECLARATION:  FULL CONTACT PHYSICAL EXAMINATION WAS NOT POSSIBLE DUE TO TREATMENT OF COVID-19 AND  CONSERVATION OF PERSONAL PROTECTIVE EQUIPMENT, LIMITED EXAM FINDINGS INCLUDE-  Patient assessed or the symptoms described in the history of present illness.  In the context of the Global COVID-19 pandemic, which necessitated consideration that the patient might be at risk for infection with the SARS-CoV-2 virus that causes COVID-19, Institutional protocols and algorithms that pertain to the evaluation of patients at risk for COVID-19 are in a state of rapid change based on information released by regulatory bodies including the CDC and federal and state organizations. These policies and algorithms were followed during the patient's care while in hospital.  REVIEW OF SYSTEMS:   Unable to assess due to lethargy and BiPAP.  SUBJECTIVE:  Unable to assess due to lethargy and BiPAP  VITAL SIGNS: Temp:  [97.5 F (36.4 C)-100.2 F (37.9 C)] 97.8 F (36.6 C) (  08/05 2137) Pulse Rate:  [80-140] 120 (08/06 0030) Resp:  [18-35] 27 (08/06 0030) BP: (85-170)/(55-100) 88/58 (08/06 0030) SpO2:  [94 %-100 %] 100 % (08/06 0030) FiO2 (%):  [30 %] 30 % (08/05 2137) Weight:  [74.8 kg-76.4 kg] 76.4 kg (08/05 2137)  PHYSICAL EXAMINATION: General:  Acutely ill appearing male, sitting in bed, on BiPAP, with mild respiratory distress Neuro:  Lethargic (recently received ativan), will arouse and follow simple commands but doses  off, no focal deficits HEENT:  Atraumatic, normocephalic, neck supple, + JVD Cardiovascular:  Tachycardia, regular rhythm, s1s2, no M/R/G Lungs:  Bilateral basilar crackles, no wheezing, BiPAP assisted, tachynpea Abdomen:  Soft, nontender, nondistended, no guarding or rebound tenderness, BS+ x4 Musculoskeletal:  No deformities, no edema Skin:  Warm and dry.  Erythematous plaque like lesions involving bilateral LE  Recent Labs  Lab 04/25/20 1909 04/26/20 0508 04/26/20 2202  NA 127* 131* 130*  K 3.8 3.4* 4.0  CL 96* 101 101  CO2 20* 22 17*  BUN 34* 31* 37*  CREATININE 1.84* 1.60* 1.80*  GLUCOSE 118* 93 189*   Recent Labs  Lab 04/25/20 1909 04/26/20 0508 04/26/20 2202  HGB 8.4* 6.9* 9.1*  HCT 25.7* 21.0* 28.0*  WBC 1.3* 1.0* 2.6*  PLT 482* 347 526*   DG Chest 1 View  Result Date: 04/26/2020 CLINICAL DATA:  Shortness of breath EXAM: CHEST  1 VIEW COMPARISON:  04/25/2020, CT 03/19/2020 FINDINGS: Stable cardiomediastinal silhouette. Interim development of vascular congestion and moderate diffuse bilateral interstitial opacities suspicious for pulmonary edema. More focal areas of airspace disease within the peripheral right greater than left lung suspicious for pneumonia. Possible tiny pleural effusions. No pneumothorax. IMPRESSION: Interim development of vascular congestion and moderate diffuse bilateral interstitial opacities suspicious for pulmonary edema. More focal areas of airspace disease within the peripheral right greater than left lung suspicious for pneumonia. Electronically Signed   By: Donavan Foil M.D.   On: 04/26/2020 21:33   DG Chest Port 1 View  Result Date: 04/25/2020 CLINICAL DATA:  Fever short of breath EXAM: PORTABLE CHEST 1 VIEW COMPARISON:  02/29/2020 FINDINGS: Patchy bilateral somewhat peripheral pulmonary opacity right greater than left. No pleural effusion. Normal heart size. No pneumothorax. IMPRESSION: Patchy bilateral peripheral pulmonary infiltrates right  greater than left concerning for bilateral pneumonia, possible atypical or viral pneumonia. Electronically Signed   By: Donavan Foil M.D.   On: 04/25/2020 19:26   ECHOCARDIOGRAM COMPLETE  Result Date: 04/26/2020    ECHOCARDIOGRAM REPORT   Patient Name:   TOBECHUKWU EMMICK [facility] Date of Exam: 04/26/2020 Medical Rec #:  [id number]       Height:       72.0 in Accession #:    [id number]      Weight:       164.9 lb Date of Birth:  [birth year]       BSA:          1.963 m Patient Age:    74 years        BP:           114/63 mmHg Patient Gender: M               HR:           91 bpm. Exam Location:  [facility] Procedure: 2D Echo, Color Doppler and Cardiac Doppler Indications:     R01.1 Murmur  History:         Patient has no prior history of Echocardiogram examinations.  CKD; Arrythmias:RBBB.  Sonographer:     Charmayne Sheer RDCS (AE) Referring Phys:  [id number] Cleaster Corin PATEL Diagnosing Phys: Kathlyn Sacramento MD IMPRESSIONS  1. Left ventricular ejection fraction, by estimation, is 55 to 60%. The left ventricle has normal function. The left ventricle has no regional wall motion abnormalities. Left ventricular diastolic parameters were normal.  2. Right ventricular systolic function is normal. The right ventricular size is normal. There is mildly elevated pulmonary artery systolic pressure.  3. Left atrial size was mild to moderately dilated.  4. The mitral valve is normal in structure. Mild to moderate mitral valve regurgitation. No evidence of mitral stenosis.  5. The aortic valve is abnormal. Aortic valve regurgitation is mild. Mild aortic valve stenosis. Aortic valve area, by VTI measures 1.82 cm. Aortic valve mean gradient measures 10.0 mmHg.  6. The inferior vena cava is normal in size with <50% respiratory variability, suggesting right atrial pressure of 8 mmHg. FINDINGS  Left Ventricle: Left ventricular ejection fraction, by estimation, is 55 to 60%. The left ventricle has normal function. The left ventricle has no regional  wall motion abnormalities. The left ventricular internal cavity size was normal in size. There is  no left ventricular hypertrophy. Left ventricular diastolic parameters were normal. Right Ventricle: The right ventricular size is normal. No increase in right ventricular wall thickness. Right ventricular systolic function is normal. There is mildly elevated pulmonary artery systolic pressure. The tricuspid regurgitant velocity is 2.97  m/s, and with an assumed right atrial pressure of 8 mmHg, the estimated right ventricular systolic pressure is 83.0 mmHg. Left Atrium: Left atrial size was mild to moderately dilated. Right Atrium: Right atrial size was normal in size. Pericardium: There is no evidence of pericardial effusion. Mitral Valve: The mitral valve is normal in structure. Normal mobility of the mitral valve leaflets. Mild to moderate mitral valve regurgitation. No evidence of mitral valve stenosis. MV peak gradient, 4.8 mmHg. The mean mitral valve gradient is 2.0 mmHg. Tricuspid Valve: The tricuspid valve is normal in structure. Tricuspid valve regurgitation is mild . No evidence of tricuspid stenosis. Aortic Valve: The aortic valve is abnormal. Aortic valve regurgitation is mild. Aortic regurgitation PHT measures 550 msec. Mild aortic stenosis is present. Aortic valve mean gradient measures 10.0 mmHg. Aortic valve peak gradient measures 17.5 mmHg. Aortic valve area, by VTI measures 1.82 cm. Pulmonic Valve: The pulmonic valve was normal in structure. Pulmonic valve regurgitation is trivial. No evidence of pulmonic stenosis. Aorta: The aortic root is normal in size and structure. Venous: The inferior vena cava is normal in size with less than 50% respiratory variability, suggesting right atrial pressure of 8 mmHg. IAS/Shunts: No atrial level shunt detected by color flow Doppler.  LEFT VENTRICLE PLAX 2D LVIDd:         4.70 cm  Diastology LVIDs:         3.13 cm  LV e' lateral:   8.16 cm/s LV PW:         0.94 cm   LV E/e' lateral: 12.0 LV IVS:        0.89 cm  LV e' medial:    6.85 cm/s LVOT diam:     2.00 cm  LV E/e' medial:  14.3 LV SV:         70 LV SV Index:   36 LVOT Area:     3.14 cm  RIGHT VENTRICLE RV Basal diam:  3.68 cm LEFT ATRIUM  Index       RIGHT ATRIUM           Index LA diam:        4.30 cm 2.19 cm/m  RA Area:     16.80 cm LA Vol (A2C):   66.6 ml 33.93 ml/m RA Volume:   41.10 ml  20.94 ml/m LA Vol (A4C):   85.0 ml 43.31 ml/m LA Biplane Vol: 78.9 ml 40.20 ml/m  AORTIC VALVE                    PULMONIC VALVE AV Area (Vmax):    1.68 cm     PV Vmax:       1.56 m/s AV Area (Vmean):   1.71 cm     PV Vmean:      102.000 cm/s AV Area (VTI):     1.82 cm     PV VTI:        0.267 m AV Vmax:           209.33 cm/s  PV Peak grad:  9.7 mmHg AV Vmean:          148.000 cm/s PV Mean grad:  5.0 mmHg AV VTI:            0.386 m AV Peak Grad:      17.5 mmHg AV Mean Grad:      10.0 mmHg LVOT Vmax:         112.00 cm/s LVOT Vmean:        80.700 cm/s LVOT VTI:          0.223 m LVOT/AV VTI ratio: 0.58 AI PHT:            550 msec  AORTA Ao Root diam: 3.60 cm MITRAL VALVE               TRICUSPID VALVE MV Area (PHT): 5.54 cm    TR Peak grad:   35.3 mmHg MV Peak grad:  4.8 mmHg    TR Vmax:        297.00 cm/s MV Mean grad:  2.0 mmHg MV Vmax:       1.10 m/s    SHUNTS MV Vmean:      70.4 cm/s   Systemic VTI:  0.22 m MV Decel Time: 137 msec    Systemic Diam: 2.00 cm MV E velocity: 97.70 cm/s MV A velocity: 59.10 cm/s MV E/A ratio:  1.65 Kathlyn Sacramento MD Electronically signed by Kathlyn Sacramento MD Signature Date/Time: 04/26/2020/12:15:51 PM    Final     ASSESSMENT / PLAN:  Acute Hypoxic Respiratory Failure secondary to Pulmonary Edema (? TRALI vs TACO following blood transfusion) & Pneumonia -Supplemental O2 as needed to maintain O2 sats >92% -BiPAP, wean as tolerated -Pt is a DNI -Follow intermittent CXR & ABG as indicated -CXR 8/5 concerning for development of pulmonary edema -Received 40 mg IV Lasix x1  dose -Further diuresis as indicated -Continue Abx as above   Hypotension, following administration of Lasix & Ativan -Continouus cardiac monitoring -Maintain MAP >65 -Cautious IV fluids given Pulmonary edema -Vasopressors if needed to maintain MAP goal -Will start Midodrine -Trend lactic acid ~ 4.3 ~ 2.4 -Check troponin ~ 80 -Echocardiogram 04/26/20 with EF 55-60% and normal diastolic parameters   Sepsis and Neutropenic Fever in setting of Pneumonia -Monitor fever curve -Trend WBC's & Procalcitonin -Follow cultures as above -ID following, appreciate input, willow follow recommendations -Abx as per ID (currently on Vancomycin, Cefepime, Azithromycin) -Workup for Fungal  and Viral etiology given persistent Pulmonary infiltrates since June   CKD Stage III -Monitor I&O's / urinary output -Follow BMP -Ensure adequate renal perfusion -Avoid nephrotoxic agents as able -Replace electrolytes as indicated   Anemia Leukopenia -Monitor for S/Sx of bleeding -Trend CBC -SCD's for VTE Prophylaxis  -Transfuse for Hgb <7 -Oncology following -Neutropenic precautions   Polycythemia rubra vera and 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

## 2020-04-27 NOTE — Progress Notes (Addendum)
Progress Note    Ruben [name]  OIN:[id number] DOB: 12/24/1945  DOA: 04/25/2020 PCP: [name], MD      Brief Narrative:    Medical records reviewed and are as summarized below:  Ruben [name] is a 74 y.o. male       Assessment/Plan:   Principal Problem:   Sepsis due to pneumonia [facility]) Active Problems:   Crohn's disease of small intestine without complication (HCC)   Hyponatremia   CKD (chronic kidney disease) stage 3, GFR 30-59 ml/min   Normocytic anemia   Acute myeloid leukemia not having achieved remission (HCC)   Febrile neutropenia ([facility])   Leukopenia due to antineoplastic chemotherapy ([place])   Sepsis secondary to pneumonia, neutropenic fever: Patient was febrile, tachycardic and tachypneic and has pneumonia.meets sepsis criteria.  Lactic acid (4.3 to 2.4) and procalcitonin (21.8) were elevated as well.  Continue empiric IV antibiotics.  Follow-up CBC and blood cultures.  Follow-up with ID and oncologist.  Acute hypoxemic respiratory failure secondary to acute diastolic CHF: He is off of BiPAP and he is tolerating room air.  Volume overload from IV fluids and blood transfusion likely contributed to acute CHF.  2D echo showed EF estimated at 55 to 60%, mild aortic stenosis, mild to moderate MR.  Elevated troponin: This is likely due to demand ischemia from hypoxia and acute CHF  AKI on CKD stage IIIa: Baseline creatinine is around 1.5.  Monitor creatinine.  Severe anemia, normocytic: Hemoglobin improved s/p transfusion with 1 unit of packed red blood cells on 04/26/2020. He said he just got blood transfusion on 04/18/2020.  He has had multiple blood and platelet transfusions in the past.  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  Continue allopurinol, Valtrex and posaconazole for ATLS, CMV and fungal infection prophylaxis.  Hypokalemia: Improved  Acute on chronic hyponatremia: Improved and is  close to baseline.  Crohn's disease: Stable    Body mass index is 22.84 kg/m.  Diet Order            Diet regular Room service appropriate? Yes; Fluid consistency: Thin  Diet effective now                       Medications:   . allopurinol  150 mg Oral Daily  . Chlorhexidine Gluconate Cloth  6 each Topical Daily  . finasteride  5 mg Oral Q supper  . folic acid  1 mg Oral Daily  . heparin  5,000 Units Subcutaneous Q8H  . midodrine  5 mg Oral TID WC  . posaconazole  300 mg Oral Daily  . sodium bicarbonate  650 mg Oral BID  . valACYclovir  500 mg Oral Daily  . vitamin B-12  1,000 mcg Oral Daily   Continuous Infusions: . sodium chloride Stopped (04/26/20 0835)  . sodium chloride Stopped (04/27/20 0127)  . azithromycin Stopped (04/27/20 1252)  . ceFEPime (MAXIPIME) IV Stopped (04/27/20 1145)  . vancomycin Stopped (04/27/20 1606)     Anti-infectives (From admission, onward)   Start     Dose/Rate Route Frequency Ordered Stop   04/26/20 1200  vancomycin (VANCOCIN) IVPB 1000 mg/200 mL premix     Discontinue     1,000 mg 200 mL/hr over 60 Minutes Intravenous Every 24 hours 04/26/20 0340     04/26/20 1000  posaconazole (NOXAFIL) delayed-release tablet 300 mg     Discontinue     300 mg Oral  Daily 04/25/20 2216     04/26/20 1000  valACYclovir (VALTREX) tablet 500 mg     Discontinue     500 mg Oral Daily 04/25/20 2216     04/26/20 0800  azithromycin (ZITHROMAX) 500 mg in sodium chloride 0.9 % 250 mL IVPB     Discontinue     500 mg 250 mL/hr over 60 Minutes Intravenous Every 24 hours 04/25/20 2211     04/26/20 0800  ceFEPIme (MAXIPIME) 2 g in sodium chloride 0.9 % 100 mL IVPB     Discontinue     2 g 200 mL/hr over 30 Minutes Intravenous Every 12 hours 04/25/20 2313     04/25/20 1930  ceFEPIme (MAXIPIME) 2 g in sodium chloride 0.9 % 100 mL IVPB        2 g 200 mL/hr over 30 Minutes Intravenous  Once 04/25/20 1916 04/25/20 2009   04/25/20 1930  metroNIDAZOLE (FLAGYL) IVPB  500 mg        500 mg 100 mL/hr over 60 Minutes Intravenous  Once 04/25/20 1916 04/25/20 2140   04/25/20 1930  vancomycin (VANCOCIN) IVPB 1000 mg/200 mL premix        1,000 mg 200 mL/hr over 60 Minutes Intravenous  Once 04/25/20 1916 04/25/20 2139             Family Communication/Anticipated D/C date and plan/Code Status   DVT prophylaxis: heparin injection 5,000 Units Start: 04/25/20 2300     Code Status: Partial Code  Family Communication: Plan discussed with his wife at the bedside Disposition Plan:    Status is: Inpatient  Remains inpatient appropriate because:IV treatments appropriate due to intensity of illness or inability to take PO and Inpatient level of care appropriate due to severity of illness   Dispo: The patient is from: Home              Anticipated d/c is to: Home              Anticipated d/c date is: 2 days              Patient currently is not medically stable to d/c.           Subjective:   Overnight events noted.  He developed acute pulmonary edema and acute hypoxemic respiratory failure requiring BiPAP and IV Lasix.  He was transferred to the stepdown unit last night for management.  He feels better today.  No shortness of breath or chest pain.    Objective:    Vitals:   04/27/20 1000 04/27/20 1100 04/27/20 1200 04/27/20 1415  BP: 104/69 90/67 104/74 102/66  Pulse: (!) 110 (!) 104 (!) 104 (!) 104  Resp: (!) 21 (!) 24 (!) 24 18  Temp:   98.7 F (37.1 C) 97.7 F (36.5 C)  TempSrc:   Oral Oral  SpO2: 100% 100% 100% 100%  Weight:      Height:       No data found.   Intake/Output Summary (Last 24 hours) at 04/27/2020 1703 Last data filed at 04/27/2020 1340 Gross per 24 hour  Intake 1477.71 ml  Output 755 ml  Net 722.71 ml   Filed Weights   04/25/20 1823 04/26/20 0442 04/26/20 2137  Weight: 68.9 kg 74.8 kg 76.4 kg    Exam:  GEN: No acute distress SKIN: Scattered erythematous macular and patchy lesions on bilateral  legs EYES: EOMI ENT: MMM CV: RRR PULM: No wheezing or rales heard ABD: soft, ND, NT, +BS CNS: AAO  x 3, non focal EXT: Chronic b/l leg edema, no tenderness   Data Reviewed:   I have personally reviewed following labs and imaging studies:  Labs: Labs show the following:   Basic Metabolic Panel: Recent Labs  Lab 04/25/20 1909 04/25/20 1909 04/26/20 0508 04/26/20 0508 04/26/20 2202 04/27/20 0054  NA 127*  --  131*  --  130* 131*  K 3.8   < > 3.4*   < > 4.0 3.9  CL 96*  --  101  --  101 101  CO2 20*  --  22  --  17* 19*  GLUCOSE 118*  --  93  --  189* 117*  BUN 34*  --  31*  --  37* 37*  CREATININE 1.84*  --  1.60*  --  1.80* 1.88*  CALCIUM 8.3*  --  8.2*  --  7.9* 8.0*  MG  --   --  1.6*  --   --  1.7  PHOS  --   --  4.2  --   --  4.7*   < > = values in this interval not displayed.   GFR Estimated Creatinine Clearance: 37.8 mL/min (A) (by C-G formula based on SCr of 1.88 mg/dL (H)). Liver Function Tests: Recent Labs  Lab 04/25/20 1909  AST 52*  ALT 13  ALKPHOS 89  BILITOT 0.7  PROT 8.8*  ALBUMIN 3.0*   No results for input(s): LIPASE, AMYLASE in the last 168 hours. No results for input(s): AMMONIA in the last 168 hours. Coagulation profile Recent Labs  Lab 04/25/20 1909 04/26/20 0508  INR 1.3* 1.5*    CBC: Recent Labs  Lab 04/25/20 1909 04/26/20 0508 04/26/20 2202 04/27/20 0054  WBC 1.3* 1.0* 2.6* 1.3*  NEUTROABS 0.4* 0.3* 0.8* 0.4*  HGB 8.4* 6.9* 9.1* 8.7*  HCT 25.7* 21.0* 28.0* 26.4*  MCV 85.7 85.7 87.5 85.4  PLT 482* 347 526* 412*   Cardiac Enzymes: No results for input(s): CKTOTAL, CKMB, CKMBINDEX, TROPONINI in the last 168 hours. BNP (last 3 results) No results for input(s): PROBNP in the last 8760 hours. CBG: Recent Labs  Lab 04/26/20 2140  GLUCAP 179*   D-Dimer: No results for input(s): DDIMER in the last 72 hours. Hgb A1c: No results for input(s): HGBA1C in the last 72 hours. Lipid Profile: No results for input(s): CHOL,  HDL, LDLCALC, TRIG, CHOLHDL, LDLDIRECT in the last 72 hours. Thyroid function studies: No results for input(s): TSH, T4TOTAL, T3FREE, THYROIDAB in the last 72 hours.  Invalid input(s): FREET3 Anemia work up: No results for input(s): VITAMINB12, FOLATE, FERRITIN, TIBC, IRON, RETICCTPCT in the last 72 hours. Sepsis Labs: Recent Labs  Lab 04/25/20 1909 04/25/20 1909 04/25/20 2057 04/26/20 0508 04/26/20 2202 04/27/20 0054  PROCALCITON  --   --   --  5.06  --  21.82  WBC 1.3*  --   --  1.0* 2.6* 1.3*  LATICACIDVEN 1.9   < > 2.4* 1.1 4.3* 2.4*   < > = values in this interval not displayed.    Microbiology Recent Results (from the past 240 hour(s))  Culture, blood (Routine x 2)     Status: None (Preliminary result)   Collection Time: 04/25/20  7:09 PM   Specimen: BLOOD  Result Value Ref Range Status   Specimen Description BLOOD BLOOD LEFT FOREARM  Final   Special Requests   Final    BOTTLES DRAWN AEROBIC AND ANAEROBIC Blood Culture adequate volume   Culture   Final  NO GROWTH 2 DAYS Performed at [facility], [address] [place], [place] [address]    Report Status PENDING  Incomplete  Culture, blood (Routine x 2)     Status: None (Preliminary result)   Collection Time: 04/25/20  7:12 PM   Specimen: BLOOD  Result Value Ref Range Status   Specimen Description BLOOD BLOOD RIGHT FOREARM  Final   Special Requests   Final    BOTTLES DRAWN AEROBIC AND ANAEROBIC Blood Culture adequate volume   Culture   Final    NO GROWTH 2 DAYS Performed at [facility], [address] [place], [place] [address]    Report Status PENDING  Incomplete  Urine culture     Status: None   Collection Time: 04/25/20  7:41 PM   Specimen: In/Out Cath Urine  Result Value Ref Range Status   Specimen Description   Final    IN/OUT CATH URINE Performed at [facility], [address], [place], [place] [address]    Special Requests   Final    NONE Performed at [facility], [address], [place], [place] [address]    Culture   Final    NO GROWTH Performed at [facility], [place] [address] [place], [place] [address]    Report Status 04/26/2020 FINAL  Final  SARS Coronavirus 2 by RT PCR (hospital order, performed in [facility] hospital lab) Nasopharyngeal Nasopharyngeal Swab     Status: None   Collection Time: 04/25/20  8:12 PM   Specimen: Nasopharyngeal Swab  Result Value Ref Range Status   SARS Coronavirus 2 NEGATIVE NEGATIVE Final    Comment: (NOTE) SARS-CoV-2 target nucleic acids are NOT DETECTED.  The SARS-CoV-2 RNA is generally detectable in upper and lower respiratory specimens during the acute phase of infection. The lowest concentration of SARS-CoV-2 viral copies this assay can detect is 250 copies / mL. A negative result does not preclude SARS-CoV-2 infection and should not be used as the sole basis for treatment or other patient management decisions.  A negative result may occur with improper specimen collection / handling, submission of specimen other than nasopharyngeal swab, presence of viral mutation(s) within the areas targeted by this assay, and inadequate number of viral copies (<250 copies / mL). A negative result must be combined with clinical observations, patient history, and epidemiological information.  Fact Sheet for Patients:   StrictlyIdeas.no  Fact Sheet for Healthcare Providers: BankingDealers.co.za  This test is not yet approved or  cleared by the [place] FDA and has been authorized for detection and/or diagnosis of SARS-CoV-2 by FDA under an Emergency Use Authorization (EUA).  This EUA will remain in effect (meaning this test can be used) for the duration of the COVID-19 declaration under Section 564(b)(1) of the Act, 21 U.S.C. section 360bbb-3(b)(1), unless the authorization is terminated or revoked sooner.  Performed at [facility], [address] [place], [place] [address]   MRSA PCR Screening     Status: None   Collection Time: 04/26/20  9:42 PM   Specimen: Nasopharyngeal  Result Value Ref Range Status   MRSA by PCR NEGATIVE NEGATIVE Final    Comment:        The GeneXpert MRSA Assay (FDA approved for NASAL specimens only), is one component of a comprehensive MRSA colonization surveillance program. It is not intended to diagnose MRSA infection nor to guide or monitor treatment for MRSA infections. Performed at [facility], [address] [place], [place] [address]     Procedures and diagnostic studies:  DG Chest 1 View  Result Date: 04/26/2020 CLINICAL DATA:  Shortness of breath EXAM: CHEST  1 VIEW COMPARISON:  04/25/2020, CT 03/19/2020 FINDINGS: Stable cardiomediastinal silhouette. Interim development of vascular congestion and moderate diffuse bilateral interstitial opacities suspicious for pulmonary edema. More focal areas of airspace disease within the peripheral right greater than left lung suspicious for pneumonia. Possible tiny pleural effusions. No pneumothorax. IMPRESSION: Interim development of vascular congestion and moderate diffuse bilateral interstitial opacities suspicious for pulmonary edema. More focal areas of airspace disease within the peripheral right greater than left lung suspicious for pneumonia. Electronically Signed   By: Donavan Foil M.D.   On: 04/26/2020 21:33   DG Chest Port 1 View  Result Date: 04/25/2020 CLINICAL DATA:  Fever short of breath EXAM: PORTABLE CHEST 1 VIEW COMPARISON:  02/29/2020 FINDINGS: Patchy bilateral somewhat peripheral pulmonary opacity right greater than left. No pleural effusion. Normal heart size. No pneumothorax. IMPRESSION: Patchy bilateral peripheral pulmonary infiltrates right greater than left concerning for bilateral pneumonia, possible atypical or viral pneumonia. Electronically Signed   By: Donavan Foil M.D.   On: 04/25/2020 19:26    ECHOCARDIOGRAM COMPLETE  Result Date: 04/26/2020    ECHOCARDIOGRAM REPORT   Patient Name:   Ruben [name] [facility] Date of Exam: 04/26/2020 Medical Rec #:  [id number]       Height:       72.0 in Accession #:    [id number]      Weight:       164.9 lb Date of Birth:  05/07/1946       BSA:          1.963 m Patient Age:    55 years        BP:           114/63 mmHg Patient Gender: M               HR:           91 bpm. Exam Location:  [facility] Procedure: 2D Echo, Color Doppler and Cardiac Doppler Indications:     R01.1 Murmur  History:         Patient has no prior history of Echocardiogram examinations.                  CKD; Arrythmias:RBBB.  Sonographer:     Charmayne Sheer RDCS (AE) Referring Phys:  [id number] Cleaster Corin PATEL Diagnosing Phys: Kathlyn Sacramento MD IMPRESSIONS  1. Left ventricular ejection fraction, by estimation, is 55 to 60%. The left ventricle has normal function. The left ventricle has no regional wall motion abnormalities. Left ventricular diastolic parameters were normal.  2. Right ventricular systolic function is normal. The right ventricular size is normal. There is mildly elevated pulmonary artery systolic pressure.  3. Left atrial size was mild to moderately dilated.  4. The mitral valve is normal in structure. Mild to moderate mitral valve regurgitation. No evidence of mitral stenosis.  5. The aortic valve is abnormal. Aortic valve regurgitation is mild. Mild aortic valve stenosis. Aortic valve area, by VTI measures 1.82 cm. Aortic valve mean gradient measures 10.0 mmHg.  6. The inferior vena cava is normal in size with <50% respiratory variability, suggesting right atrial pressure of 8 mmHg. FINDINGS  Left Ventricle: Left ventricular ejection fraction, by estimation, is 55 to 60%. The left ventricle has normal function. The left ventricle has no regional wall motion abnormalities. The left ventricular internal  cavity size was normal in size. There is  no left ventricular hypertrophy. Left ventricular diastolic  parameters were normal. Right Ventricle: The right ventricular size is normal. No increase in right ventricular wall thickness. Right ventricular systolic function is normal. There is mildly elevated pulmonary artery systolic pressure. The tricuspid regurgitant velocity is 2.97  m/s, and with an assumed right atrial pressure of 8 mmHg, the estimated right ventricular systolic pressure is 16.3 mmHg. Left Atrium: Left atrial size was mild to moderately dilated. Right Atrium: Right atrial size was normal in size. Pericardium: There is no evidence of pericardial effusion. Mitral Valve: The mitral valve is normal in structure. Normal mobility of the mitral valve leaflets. Mild to moderate mitral valve regurgitation. No evidence of mitral valve stenosis. MV peak gradient, 4.8 mmHg. The mean mitral valve gradient is 2.0 mmHg. Tricuspid Valve: The tricuspid valve is normal in structure. Tricuspid valve regurgitation is mild . No evidence of tricuspid stenosis. Aortic Valve: The aortic valve is abnormal. Aortic valve regurgitation is mild. Aortic regurgitation PHT measures 550 msec. Mild aortic stenosis is present. Aortic valve mean gradient measures 10.0 mmHg. Aortic valve peak gradient measures 17.5 mmHg. Aortic valve area, by VTI measures 1.82 cm. Pulmonic Valve: The pulmonic valve was normal in structure. Pulmonic valve regurgitation is trivial. No evidence of pulmonic stenosis. Aorta: The aortic root is normal in size and structure. Venous: The inferior vena cava is normal in size with less than 50% respiratory variability, suggesting right atrial pressure of 8 mmHg. IAS/Shunts: No atrial level shunt detected by color flow Doppler.  LEFT VENTRICLE PLAX 2D LVIDd:         4.70 cm  Diastology LVIDs:         3.13 cm  LV e' lateral:   8.16 cm/s LV PW:         0.94 cm  LV E/e' lateral: 12.0 LV IVS:        0.89 cm  LV e' medial:    6.85 cm/s LVOT diam:     2.00 cm  LV E/e' medial:  14.3 LV SV:         70 LV SV Index:   36  LVOT Area:     3.14 cm  RIGHT VENTRICLE RV Basal diam:  3.68 cm LEFT ATRIUM             Index       RIGHT ATRIUM           Index LA diam:        4.30 cm 2.19 cm/m  RA Area:     16.80 cm LA Vol (A2C):   66.6 ml 33.93 ml/m RA Volume:   41.10 ml  20.94 ml/m LA Vol (A4C):   85.0 ml 43.31 ml/m LA Biplane Vol: 78.9 ml 40.20 ml/m  AORTIC VALVE                    PULMONIC VALVE AV Area (Vmax):    1.68 cm     PV Vmax:       1.56 m/s AV Area (Vmean):   1.71 cm     PV Vmean:      102.000 cm/s AV Area (VTI):     1.82 cm     PV VTI:        0.267 m AV Vmax:           209.33 cm/s  PV Peak grad:  9.7 mmHg AV Vmean:  148.000 cm/s PV Mean grad:  5.0 mmHg AV VTI:            0.386 m AV Peak Grad:      17.5 mmHg AV Mean Grad:      10.0 mmHg LVOT Vmax:         112.00 cm/s LVOT Vmean:        80.700 cm/s LVOT VTI:          0.223 m LVOT/AV VTI ratio: 0.58 AI PHT:            550 msec  AORTA Ao Root diam: 3.60 cm MITRAL VALVE               TRICUSPID VALVE MV Area (PHT): 5.54 cm    TR Peak grad:   35.3 mmHg MV Peak grad:  4.8 mmHg    TR Vmax:        297.00 cm/s MV Mean grad:  2.0 mmHg MV Vmax:       1.10 m/s    SHUNTS MV Vmean:      70.4 cm/s   Systemic VTI:  0.22 m MV Decel Time: 137 msec    Systemic Diam: 2.00 cm MV E velocity: 97.70 cm/s MV A velocity: 59.10 cm/s MV E/A ratio:  1.65 Kathlyn Sacramento MD Electronically signed by Kathlyn Sacramento MD Signature Date/Time: 04/26/2020/12:15:51 PM    Final                LOS: 2 days   Erving Sassano  [facility]     04/27/2020, 5:03 PM

## 2020-04-28 LAB — CBC WITH DIFFERENTIAL/PLATELET
Abs Immature Granulocytes: 0.04 10*3/uL (ref 0.00–0.07)
Basophils Absolute: 0 10*3/uL (ref 0.0–0.1)
Basophils Relative: 0 %
Eosinophils Absolute: 0 10*3/uL (ref 0.0–0.5)
Eosinophils Relative: 0 %
HCT: 24.2 % — ABNORMAL LOW (ref 39.0–52.0)
Hemoglobin: 7.8 g/dL — ABNORMAL LOW (ref 13.0–17.0)
Immature Granulocytes: 3 %
Lymphocytes Relative: 44 %
Lymphs Abs: 0.5 10*3/uL — ABNORMAL LOW (ref 0.7–4.0)
MCH: 28.3 pg (ref 26.0–34.0)
MCHC: 32.2 g/dL (ref 30.0–36.0)
MCV: 87.7 fL (ref 80.0–100.0)
Monocytes Absolute: 0.4 10*3/uL (ref 0.1–1.0)
Monocytes Relative: 32 %
Neutro Abs: 0.3 10*3/uL — ABNORMAL LOW (ref 1.7–7.7)
Neutrophils Relative %: 21 %
Platelets: 382 10*3/uL (ref 150–400)
RBC: 2.76 MIL/uL — ABNORMAL LOW (ref 4.22–5.81)
RDW: 20.2 % — ABNORMAL HIGH (ref 11.5–15.5)
Smear Review: ADEQUATE
WBC: 1.2 10*3/uL — CL (ref 4.0–10.5)
nRBC: 10.3 % — ABNORMAL HIGH (ref 0.0–0.2)

## 2020-04-28 LAB — COMPREHENSIVE METABOLIC PANEL
ALT: 10 U/L (ref 0–44)
AST: 45 U/L — ABNORMAL HIGH (ref 15–41)
Albumin: 2.3 g/dL — ABNORMAL LOW (ref 3.5–5.0)
Alkaline Phosphatase: 70 U/L (ref 38–126)
Anion gap: 11 (ref 5–15)
BUN: 47 mg/dL — ABNORMAL HIGH (ref 8–23)
CO2: 18 mmol/L — ABNORMAL LOW (ref 22–32)
Calcium: 8.2 mg/dL — ABNORMAL LOW (ref 8.9–10.3)
Chloride: 100 mmol/L (ref 98–111)
Creatinine, Ser: 2.06 mg/dL — ABNORMAL HIGH (ref 0.61–1.24)
GFR calc Af Amer: 36 mL/min — ABNORMAL LOW (ref >60)
GFR calc non Af Amer: 31 mL/min — ABNORMAL LOW (ref >60)
Glucose, Bld: 96 mg/dL (ref 70–99)
Potassium: 4 mmol/L (ref 3.5–5.1)
Sodium: 129 mmol/L — ABNORMAL LOW (ref 135–145)
Total Bilirubin: 1 mg/dL (ref 0.3–1.2)
Total Protein: 7.4 g/dL (ref 6.5–8.1)

## 2020-04-28 LAB — MAGNESIUM: Magnesium: 1.6 mg/dL — ABNORMAL LOW (ref 1.7–2.4)

## 2020-04-28 MED ORDER — LACTATED RINGERS IV SOLN: INTRAVENOUS | Status: DC

## 2020-04-28 MED ORDER — MAGNESIUM SULFATE 2 GM/50ML IV SOLN
2.0000 g | Freq: Once | INTRAVENOUS | Status: AC
  Administered 2020-04-28: 2 g via INTRAVENOUS
  Filled 2020-04-28: qty 50

## 2020-04-28 MED ORDER — BOOST / RESOURCE BREEZE PO LIQD CUSTOM
1.0000 | Freq: Three times a day (TID) | ORAL | Status: DC
  Administered 2020-04-28 – 2020-04-30 (×5): 1 via ORAL

## 2020-04-28 NOTE — Plan of Care (Signed)
  Problem: Fluid Volume: Goal: Hemodynamic stability will improve Outcome: Progressing   Problem: Clinical Measurements: Goal: Diagnostic test results will improve Outcome: Progressing Goal: Signs and symptoms of infection will decrease Outcome: Progressing   Problem: Respiratory: Goal: Ability to maintain adequate ventilation will improve Outcome: Progressing

## 2020-04-28 NOTE — Progress Notes (Signed)
Dr.Corcoran asked for a cdiff test. Called patient and spoke to wife- the last 2 bowel movts he had today has been solid- He had 1 loose stool earlier- but otherwise it has been mushy No pain abdomen No fever H/o crohns Currently No clinical reason to test for cdiff. Pt to eat solid food ( has had poor appetite and just having liquids) Will DC vanco as cr up and also MRSA nares neg He has no central line or PORT

## 2020-04-28 NOTE — Progress Notes (Signed)
[facility] Hematology/Oncology Progress Note  Date of admission: 04/25/2020  Hospital day:  04/28/2020  Chief Complaint: Ruben [name] is a 74 y.o. 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   Subjective:   Patient notes diarrhea and soiled clothes.  Appetite is poor.  He likes Boost.  He would like the Foley catheter out.  Social History: The patient is accompanied by his wife, [name], today.  Allergies: No Known Allergies  Scheduled Medications: . allopurinol  150 mg Oral Daily  . Chlorhexidine Gluconate Cloth  6 each Topical Daily  . finasteride  5 mg Oral Q supper  . folic acid  1 mg Oral Daily  . heparin  5,000 Units Subcutaneous Q8H  . latanoprost  1 drop Both Eyes QHS  . midodrine  5 mg Oral TID WC  . posaconazole  300 mg Oral Daily  . sodium bicarbonate  650 mg Oral BID  . sodium chloride flush  3 mL Intravenous Q12H  . timolol  1 drop Both Eyes Daily  . valACYclovir  500 mg Oral Daily  . vitamin B-12  1,000 mcg Oral Daily    Review of Systems  Constitutional: Negative for chills, diaphoresis and fever.       Fatigue.  HENT: Negative for congestion, ear pain, nosebleeds, sinus pain and sore throat.   Eyes: Negative.  Negative for blurred vision, double vision and photophobia.  Respiratory: Positive for cough and shortness of breath (stable). Negative for hemoptysis, sputum production and wheezing.   Cardiovascular: Positive for leg swelling. Negative for chest pain, palpitations and orthopnea.  Gastrointestinal: Positive for diarrhea. Negative for abdominal pain, blood in stool, constipation, heartburn, melena, nausea and vomiting.       Poor appetite.  Genitourinary: Negative for dysuria, flank pain, frequency, hematuria and urgency.       Foley catheter in place.  Patient would like to return to  in and out caths.  Musculoskeletal: Negative.  Negative for back pain, joint pain, myalgias and neck pain.  Skin: Negative.  Negative for itching and rash.       Dressing placed on bottom to prevent bed sores.  Neurological: Positive for weakness (generalized) and headaches. Negative for dizziness, tingling, tremors, sensory change and focal weakness.  Endo/Heme/Allergies: Negative.  Does not bruise/bleed easily.  Psychiatric/Behavioral: Negative.  Negative for depression and memory loss. The patient is not nervous/anxious and does not have insomnia.    Vitals: Blood pressure 104/67, pulse 82, temperature (!) 97.4 F (36.3 C), temperature source Oral, resp. rate 20, height 6' (1.829 m), weight 170 lb (77.1 kg), SpO2 100 %.   Physical Exam Constitutional:      Appearance: He is not diaphoretic.     Interventions: Face mask in place.     Comments: Fatigued appearing gentleman lying in bed in no acute distress.  HENT:     Head: Normocephalic and atraumatic.     Comments: Short hair.    Mouth/Throat:     Mouth: Mucous membranes are moist.     Pharynx: Oropharynx is clear. No oropharyngeal exudate or posterior oropharyngeal erythema.  Eyes:     General: No scleral icterus.    Extraocular Movements: Extraocular movements intact.     Conjunctiva/sclera: Conjunctivae normal.     Pupils: Pupils are equal, round, and reactive to light.     Comments: Glasses.  Cardiovascular:     Rate and Rhythm: Normal rate and regular rhythm.     Pulses: Normal pulses.     Heart sounds: Normal heart sounds. No murmur heard.   Pulmonary:     Effort: Pulmonary effort is normal.     Breath sounds: Normal breath sounds.  Abdominal:     General: Bowel sounds are normal. There is no distension.     Palpations: There is no mass.     Tenderness: There is no abdominal tenderness. There is no guarding or rebound.  Genitourinary:    Comments: Foley in place. Musculoskeletal:        General: Swelling (1+)  present. No tenderness. Normal range of motion.     Cervical back: Normal range of motion and neck supple.  Lymphadenopathy:     Head:     Right side of head: No preauricular, posterior auricular or occipital adenopathy.     Left side of head: No preauricular, posterior auricular or occipital adenopathy.     Cervical: No cervical adenopathy.     Upper Body:     Right upper body: No supraclavicular or axillary adenopathy.     Left upper body: No supraclavicular or axillary adenopathy.     Lower Body: No right inguinal adenopathy. No left inguinal adenopathy.  Skin:    General: Skin is warm and dry.     Coloration: Skin is not jaundiced or pale.     Findings: No bruising, erythema, lesion or rash.  Neurological:     General: No focal deficit present.     Mental Status: He is alert and oriented to person, place, and time. Mental status is at baseline.  Psychiatric:        Mood and Affect: Mood normal.        Behavior: Behavior normal.        Thought Content: Thought content normal.        Judgment: Judgment normal.     Results for orders placed or performed during the hospital encounter of 04/25/20 (from the past 48 hour(s))  Blood gas, arterial     Status: Abnormal   Collection Time: 04/26/20  8:58 PM  Result Value Ref Range   FIO2 0.32    Delivery systems NASAL CANNULA    pH, Arterial 7.32 (L) 7.35 - 7.45   pCO2 arterial 30 (L) 32 - 48 mmHg   pO2, Arterial 57 (L) 83 - 108 mmHg   Bicarbonate 15.5 (L) 20.0 - 28.0 mmol/L   Acid-base deficit 9.5 (H) 0.0 - 2.0 mmol/L   O2 Saturation 86.6 %   Patient temperature 37.0    Collection site LEFT RADIAL    Sample type ARTERIAL DRAW    Allens test (pass/fail) PASS PASS    Comment: Performed at [facility], [address] [place], [place] [address]  Glucose, capillary     Status: Abnormal   Collection Time: 04/26/20  9:40 PM  Result Value Ref Range   Glucose-Capillary 179 (H) 70 - 99 mg/dL    Comment: Glucose reference  range applies only to samples taken after fasting for at least 8 hours.  MRSA PCR Screening     Status: None   Collection Time: 04/26/20  9:42 PM   Specimen: Nasopharyngeal  Result Value Ref Range   MRSA by PCR NEGATIVE NEGATIVE    Comment:        The GeneXpert MRSA Assay (FDA approved for NASAL specimens only), is one component of a comprehensive MRSA  colonization surveillance program. It is not intended to diagnose MRSA infection nor to guide or monitor treatment for MRSA infections. Performed at [facility], [address] [place], [place] [address]   CBC with Differential/Platelet     Status: Abnormal   Collection Time: 04/26/20 10:02 PM  Result Value Ref Range   WBC 2.6 (L) 4.0 - 10.5 K/uL   RBC 3.20 (L) 4.22 - 5.81 MIL/uL   Hemoglobin 9.1 (L) 13.0 - 17.0 g/dL    Comment: REPEATED TO VERIFY   HCT 28.0 (L) 39 - 52 %   MCV 87.5 80.0 - 100.0 fL   MCH 28.4 26.0 - 34.0 pg   MCHC 32.5 30.0 - 36.0 g/dL   RDW 19.9 (H) 11.5 - 15.5 %   Platelets 526 (H) 150 - 400 K/uL   nRBC 7.7 (H) 0.0 - 0.2 %   Neutrophils Relative % 31 %   Neutro Abs 0.8 (L) 1.7 - 7.7 K/uL   Lymphocytes Relative 57 %   Lymphs Abs 1.5 0.7 - 4.0 K/uL   Monocytes Relative 10 %   Monocytes Absolute 0.3 0 - 1 K/uL   Eosinophils Relative 0 %   Eosinophils Absolute 0.0 0 - 0 K/uL   Basophils Relative 1 %   Basophils Absolute 0.0 0 - 0 K/uL   WBC Morphology      MARKED LEFT SHIFT (>5% METAS,MYELOS AND PROS, OCC BLAST NOTED)   Immature Granulocytes 1 %   Abs Immature Granulocytes 0.02 0.00 - 0.07 K/uL   Polychromasia PRESENT    Giant PLTs PRESENT     Comment: Performed at [facility], [address], [place], [place] [address]  Basic metabolic panel     Status: Abnormal   Collection Time: 04/26/20 10:02 PM  Result Value Ref Range   Sodium 130 (L) 135 - 145 mmol/L   Potassium 4.0 3.5 - 5.1 mmol/L   Chloride 101 98 - 111 mmol/L   CO2 17 (L) 22 - 32 mmol/L   Glucose, Bld 189 (H) 70 -  99 mg/dL    Comment: Glucose reference range applies only to samples taken after fasting for at least 8 hours.   BUN 37 (H) 8 - 23 mg/dL   Creatinine, Ser 1.80 (H) 0.61 - 1.24 mg/dL   Calcium 7.9 (L) 8.9 - 10.3 mg/dL   GFR calc non Af Amer 37 (L) >60 mL/min   GFR calc Af Amer 42 (L) >60 mL/min   Anion gap 12 5 - 15    Comment: Performed at [facility], [address] [place], [place] [address]  Lactic acid, plasma     Status: Abnormal   Collection Time: 04/26/20 10:02 PM  Result Value Ref Range   Lactic Acid, Venous 4.3 (HH) 0.5 - 1.9 mmol/L    Comment: CRITICAL RESULT CALLED TO, READ BACK BY AND VERIFIED WITH [facility] OLEJAR AT 2247 ON 04/26/20 RWW Performed at [facility], [address] [place], [place] [address]   Troponin I (High Sensitivity)     Status: Abnormal   Collection Time: 04/26/20 10:02 PM  Result Value Ref Range   Troponin I (High Sensitivity) 80 (H) <18 ng/L    Comment: (NOTE) Elevated high sensitivity troponin I (hsTnI) values and significant  changes across serial measurements may suggest ACS but many other  chronic and acute conditions are known to elevate hsTnI results.  Refer to the "Links" section for chest pain algorithms and additional  guidance. Performed at [facility], [address] [place], [place] [address]   Procalcitonin     Status: None   Collection Time: 04/27/20 12:54 AM  Result Value Ref Range   Procalcitonin 21.82 ng/mL    Comment:        Interpretation: PCT >= 10 ng/mL: Important systemic inflammatory response, almost exclusively due to severe bacterial sepsis or septic shock. (NOTE)       Sepsis PCT Algorithm           Lower Respiratory Tract                                      Infection PCT Algorithm    ----------------------------     ----------------------------         PCT < 0.25 ng/mL                PCT < 0.10 ng/mL          Strongly encourage             Strongly discourage   discontinuation  of antibiotics    initiation of antibiotics    ----------------------------     -----------------------------       PCT 0.25 - 0.50 ng/mL            PCT 0.10 - 0.25 ng/mL               OR       >80% decrease in PCT            Discourage initiation of                                            antibiotics      Encourage discontinuation           of antibiotics    ----------------------------     -----------------------------         PCT >= 0.50 ng/mL              PCT 0.26 - 0.50 ng/mL                AND       <80% decrease in PCT             Encourage initiation of                                             antibiotics       Encourage continuation           of antibiotics    ----------------------------     -----------------------------        PCT >= 0.50 ng/mL                  PCT > 0.50 ng/mL               AND         increase in PCT                  Strongly encourage  initiation of antibiotics    Strongly encourage escalation           of antibiotics                                     -----------------------------                                           PCT <= 0.25 ng/mL                                                 OR                                        > 80% decrease in PCT                                      Discontinue / Do not initiate                                             antibiotics  Performed at [facility], [address] [place], [place] [address]   CBC with Differential/Platelet     Status: Abnormal   Collection Time: 04/27/20 12:54 AM  Result Value Ref Range   WBC 1.3 (LL) 4.0 - 10.5 K/uL    Comment: CRITICAL VALUE NOTED.  VALUE IS CONSISTENT WITH PREVIOUSLY REPORTED AND CALLED VALUE. THIS CRITICAL RESULT HAS VERIFIED AND BEEN CALLED TO Mila Merry, RN BY PATRICIA CABELLERO ON 08 06 2021 AT 66, AND HAS BEEN READ BACK.     RBC 3.09 (L) 4.22 - 5.81 MIL/uL   Hemoglobin 8.7 (L) 13.0 - 17.0 g/dL    HCT 26.4 (L) 39 - 52 %   MCV 85.4 80.0 - 100.0 fL   MCH 28.2 26.0 - 34.0 pg   MCHC 33.0 30.0 - 36.0 g/dL   RDW 19.9 (H) 11.5 - 15.5 %   Platelets 412 (H) 150 - 400 K/uL   nRBC 7.6 (H) 0.0 - 0.2 %   Neutrophils Relative % 28 %   Neutro Abs 0.4 (L) 1.7 - 7.7 K/uL   Lymphocytes Relative 51 %   Lymphs Abs 0.7 0.7 - 4.0 K/uL   Monocytes Relative 9 %   Monocytes Absolute 0.1 0 - 1 K/uL   Eosinophils Relative 0 %   Eosinophils Absolute 0.0 0 - 0 K/uL   Basophils Relative 2 %   Basophils Absolute 0.0 0 - 0 K/uL   WBC Morphology      MARKED LEFT SHIFT (>5% METAS,MYELOS AND PROS, OCC BLAST NOTED)   Smear Review Reviewed    Immature Granulocytes 10 %   Abs Immature Granulocytes 0.13 (H) 0.00 - 0.07 K/uL   Abnormal Lymphocytes Present PRESENT    Schistocytes PRESENT    Polychromasia PRESENT    Giant PLTs PRESENT     Comment: Performed  at [facility], [address] [place], [place] [address]  Basic metabolic panel     Status: Abnormal   Collection Time: 04/27/20 12:54 AM  Result Value Ref Range   Sodium 131 (L) 135 - 145 mmol/L   Potassium 3.9 3.5 - 5.1 mmol/L   Chloride 101 98 - 111 mmol/L   CO2 19 (L) 22 - 32 mmol/L   Glucose, Bld 117 (H) 70 - 99 mg/dL    Comment: Glucose reference range applies only to samples taken after fasting for at least 8 hours.   BUN 37 (H) 8 - 23 mg/dL   Creatinine, Ser 1.88 (H) 0.61 - 1.24 mg/dL   Calcium 8.0 (L) 8.9 - 10.3 mg/dL   GFR calc non Af Amer 35 (L) >60 mL/min   GFR calc Af Amer 40 (L) >60 mL/min   Anion gap 11 5 - 15    Comment: Performed at [facility], [address], [place], [place] [address]  Magnesium     Status: None   Collection Time: 04/27/20 12:54 AM  Result Value Ref Range   Magnesium 1.7 1.7 - 2.4 mg/dL    Comment: Performed at [facility], [address] [place], [place] [address]  Phosphorus     Status: Abnormal   Collection Time: 04/27/20 12:54 AM  Result Value Ref Range   Phosphorus 4.7  (H) 2.5 - 4.6 mg/dL    Comment: Performed at [facility], [address] [place], [place] [address]  Lactic acid, plasma     Status: Abnormal   Collection Time: 04/27/20 12:54 AM  Result Value Ref Range   Lactic Acid, Venous 2.4 (HH) 0.5 - 1.9 mmol/L    Comment: CRITICAL VALUE NOTED. VALUE IS CONSISTENT WITH PREVIOUSLY REPORTED/CALLED VALUE RWW Performed at [facility], [address] [place], [place] [address]   Cryptococcal antigen     Status: None   Collection Time: 04/27/20 12:54 AM  Result Value Ref Range   Crypto Ag NEGATIVE NEGATIVE   Cryptococcal Ag Titer NOT INDICATED NOT INDICATED    Comment: Performed at [facility] [address] [place], [place] [address]  Troponin I (High Sensitivity)     Status: Abnormal   Collection Time: 04/27/20 12:54 AM  Result Value Ref Range   Troponin I (High Sensitivity) 368 (HH) <18 ng/L    Comment: CRITICAL RESULT CALLED TO, READ BACK BY AND VERIFIED WITH SARAH OLEJAR AT 0225 ON 04/27/20 RWW (NOTE) Elevated high sensitivity troponin I (hsTnI) values and significant  changes across serial measurements may suggest ACS but many other  chronic and acute conditions are known to elevate hsTnI results.  Refer to the "Links" section for chest pain algorithms and additional  guidance. Performed at [facility], [address] [place], [place] [address]   Brain natriuretic peptide     Status: Abnormal   Collection Time: 04/27/20 12:54 AM  Result Value Ref Range   B Natriuretic Peptide 460.3 (H) 0.0 - 100.0 pg/mL    Comment: Performed at [facility], [place],  [address]  Troponin I (High Sensitivity)     Status: Abnormal   Collection Time: 04/27/20  6:06 PM  Result Value Ref Range   Troponin I (High Sensitivity) 147 (HH) <18 ng/L    Comment: CRITICAL VALUE NOTED. VALUE IS CONSISTENT WITH PREVIOUSLY REPORTED/CALLED VALUE MJU (NOTE) Elevated high sensitivity troponin I  (hsTnI) values and significant  changes across serial measurements  may suggest ACS but many other  chronic and acute conditions are known to elevate hsTnI results.  Refer to the "Links" section for chest pain algorithms and additional  guidance. Performed at [facility], [address] [place], [place] [address]   Comprehensive metabolic panel     Status: Abnormal   Collection Time: 04/28/20  4:14 AM  Result Value Ref Range   Sodium 129 (L) 135 - 145 mmol/L   Potassium 4.0 3.5 - 5.1 mmol/L   Chloride 100 98 - 111 mmol/L   CO2 18 (L) 22 - 32 mmol/L   Glucose, Bld 96 70 - 99 mg/dL    Comment: Glucose reference range applies only to samples taken after fasting for at least 8 hours.   BUN 47 (H) 8 - 23 mg/dL   Creatinine, Ser 2.06 (H) 0.61 - 1.24 mg/dL   Calcium 8.2 (L) 8.9 - 10.3 mg/dL   Total Protein 7.4 6.5 - 8.1 g/dL   Albumin 2.3 (L) 3.5 - 5.0 g/dL   AST 45 (H) 15 - 41 U/L   ALT 10 0 - 44 U/L   Alkaline Phosphatase 70 38 - 126 U/L   Total Bilirubin 1.0 0.3 - 1.2 mg/dL   GFR calc non Af Amer 31 (L) >60 mL/min   GFR calc Af Amer 36 (L) >60 mL/min   Anion gap 11 5 - 15    Comment: Performed at [facility], [address] [place], [place] [address]  Magnesium     Status: Abnormal   Collection Time: 04/28/20  4:14 AM  Result Value Ref Range   Magnesium 1.6 (L) 1.7 - 2.4 mg/dL    Comment: Performed at [facility], [address] [place], [place] [address]  CBC with Differential/Platelet     Status: Abnormal (Preliminary result)   Collection Time: 04/28/20  4:14 AM  Result Value Ref Range   WBC 1.2 (LL) 4.0 - 10.5 K/uL    Comment: CRITICAL VALUE NOTED.  VALUE IS CONSISTENT WITH PREVIOUSLY REPORTED AND CALLED VALUE.   RBC 2.76 (L) 4.22 - 5.81 MIL/uL   Hemoglobin 7.8 (L) 13.0 - 17.0 g/dL   HCT 24.2 (L) 39 - 52 %   MCV 87.7 80.0 - 100.0 fL   MCH 28.3 26.0 - 34.0 pg   MCHC 32.2 30.0 - 36.0 g/dL   RDW 20.2 (H) 11.5 - 15.5 %   Platelets 382 150 -  400 K/uL   nRBC 10.3 (H) 0.0 - 0.2 %    Comment: Performed at [facility], [address] [place], [place] [address]   Neutrophils Relative % PENDING %   Neutro Abs PENDING 1.7 - 7.7 K/uL   Band Neutrophils PENDING %   Lymphocytes Relative PENDING %   Lymphs Abs PENDING 0.7 - 4.0 K/uL   Monocytes Relative PENDING %   Monocytes Absolute PENDING 0 - 1 K/uL   Eosinophils Relative PENDING %   Eosinophils Absolute PENDING 0 - 0 K/uL   Basophils Relative PENDING %   Basophils Absolute PENDING 0 - 0 K/uL   WBC Morphology PENDING    RBC Morphology PENDING    Smear Review PENDING    Other PENDING %   nRBC PENDING 0 /100 WBC   Metamyelocytes Relative PENDING %   Myelocytes PENDING %   Promyelocytes Relative PENDING %   Blasts PENDING %   Immature Granulocytes PENDING %   Abs Immature Granulocytes PENDING 0.00 - 0.07 K/uL   DG Chest  1 View  Result Date: 04/26/2020 CLINICAL DATA:  Shortness of breath EXAM: CHEST  1 VIEW COMPARISON:  04/25/2020, CT 03/19/2020 FINDINGS: Stable cardiomediastinal silhouette. Interim development of vascular congestion and moderate diffuse bilateral interstitial opacities suspicious for pulmonary edema. More focal areas of airspace disease within the peripheral right greater than left lung suspicious for pneumonia. Possible tiny pleural effusions. No pneumothorax. IMPRESSION: Interim development of vascular congestion and moderate diffuse bilateral interstitial opacities suspicious for pulmonary edema. More focal areas of airspace disease within the peripheral right greater than left lung suspicious for pneumonia. Electronically Signed   By: Donavan Foil M.D.   On: 04/26/2020 21:33    Assessment:  Ruben [name] is a 74 y.o. 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  He was admitted with fever and neutropenia.  He has had a history of pneumonia since 02/2020.  Chest  x-ray on 04/26/2020 revealed interval development of vascular congestion and moderate diffuse bilateral interstitial opacities suspicious for pulmonary edema.  There are more focal areas of airspace disease within the peripheral right> left lung suspicious for pneumonia   He has Crohn's colitis.  Symptomatically, appetite is decreased.  He has diarrhea.  He denies any increased shortness of breath.  He denies any pain.  Plan:   1.   Acute myelogenous leukemia             Patient transformed from polycythemia rubra vera.             He is day 28 of cycle #1 azacitidine + Venetoclax.             Venetoclax is on hold.             Discuss plans for bone marrow biopsy- await phone follow-up with Dr Evelene Croon [facility] or [facility]). 2.   Anemia and leukopenia             Hematocrit 26.4.  Hemoglobin 8.7.  MCV 85.4.  Platelets 412,000.  WBC 1300 with an [place] of 400 on 04/27/2020.  Hematocrit 24.2.  Hemoglobin 7.8.  MCV 87.7.  Platelets 382,000.  WBC 1200 with an [place] of 300 on 04/28/2020.   Monocytes are 32% which suggest a recovering marrow s/p chemotherapy.             Maintain active type and screen.  Transfuse to maintain Hgb > 8.0.   Type and screen in a.m. and likely transfusion.             ALL products leukopoor and irradiated.             Continue neutropenic precautions.             CBC with differential daily. 3.   Fever and neutropenia             Patient afebrile.  Patient has had infiltrates prior to initiation of induction chemotherapy.             CXR at [facility] showed improvement.             Patient remains on broad-spectrum antibiotics (Cefepime, vancomycin, azithromycin, and valacyclovir).             Blood culture q 24 hours prn temp>= 100.4    4.   Diarrhea  Diarrhea has increased.  Check C diff secondary to multiple antibiotics 5.   Poor oral intake  Patient eating 1/2 portions.  Patient likes Boost (request TID with meals). 6.   Foley catheter  Consider  discontinuation as Foley is a source of infection.  Patient can return to in and out catheterization. 7.   Code status             Partial code per chart.    Lequita Asal, MD  04/28/2020, 1:42 PM

## 2020-04-28 NOTE — Progress Notes (Addendum)
Progress Note    Ruben [name]  SWF:[id number] DOB: 02-01-1946  DOA: 04/25/2020 PCP: [name], MD      Brief Narrative:    Medical records reviewed and are as summarized below:  Ruben [name] is a 74 y.o. male       Assessment/Plan:   Principal Problem:   Sepsis due to pneumonia [facility]) Active Problems:   Crohn's disease of small intestine without complication (HCC)   Hyponatremia   CKD (chronic kidney disease) stage 3, GFR 30-59 ml/min   Normocytic anemia   Acute myeloid leukemia not having achieved remission (HCC)   Febrile neutropenia ([place])   Leukopenia due to antineoplastic chemotherapy ([place])   Sepsis secondary to pneumonia, neutropenic fever: Patient was febrile, tachycardic and tachypneic and has pneumonia.meets sepsis criteria.  Lactic acid (4.3 to 2.4) and procalcitonin (21.8) were elevated as well.  Continue empiric IV antibiotics.  He is still neutropenic.  Follow-up with ID and oncologist.  Acute hypoxemic respiratory failure secondary to acute diastolic CHF: Resolved.  He is off of BiPAP and he is tolerating room air.  Volume overload from IV fluids and blood transfusion likely contributed to acute CHF.  2D echo showed EF estimated at 55 to 60%, mild aortic stenosis, mild to moderate MR.  Elevated troponin: This is likely due to demand ischemia from hypoxia and acute CHF  AKI on CKD stage IIIa, metabolic acidosis: Creatinine is trending up.  Start low rate IV fluids (Ringer's lactate) because of diarrhea.  Baseline creatinine is around 1.5.  Monitor creatinine.  Severe anemia, normocytic: Hemoglobin is down again to 7. No indication for blood transfusion at this time.  S/p transfusion with 1 unit of packed red blood cells on 04/26/2020. He has had multiple blood and platelet transfusions in the past.  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  Continue allopurinol, Valtrex and posaconazole  for ATLS, CMV and fungal infection prophylaxis.  Hypokalemia: Improved  Hypomagnesemia: Replete with IV magnesium sulfate and monitor levels.  Acute on chronic hyponatremia: Sodium level is close to baseline.  Crohn's disease, diarrhea: He has had issues with intermittent/chronic diarrhea at home.  Symptomatic treatment/supportive care   Body mass index is 23.06 kg/m.  Diet Order            Diet regular Room service appropriate? Yes; Fluid consistency: Thin  Diet effective now                       Medications:   . allopurinol  150 mg Oral Daily  . Chlorhexidine Gluconate Cloth  6 each Topical Daily  . finasteride  5 mg Oral Q supper  . folic acid  1 mg Oral Daily  . heparin  5,000 Units Subcutaneous Q8H  . latanoprost  1 drop Both Eyes QHS  . midodrine  5 mg Oral TID WC  . posaconazole  300 mg Oral Daily  . sodium bicarbonate  650 mg Oral BID  . sodium chloride flush  3 mL Intravenous Q12H  . timolol  1 drop Both Eyes Daily  . valACYclovir  500 mg Oral Daily  . vitamin B-12  1,000 mcg Oral Daily   Continuous Infusions: . sodium chloride 10 mL/hr at 04/28/20 0500  . sodium chloride Stopped (04/27/20 0127)  . azithromycin 500 mg (04/28/20 0930)  . ceFEPime (MAXIPIME) IV 2 g (04/28/20 1151)  . lactated ringers 50 mL/hr at 04/28/20 1144  .  vancomycin 1,000 mg (04/28/20 1333)     Anti-infectives (From admission, onward)   Start     Dose/Rate Route Frequency Ordered Stop   04/26/20 1200  vancomycin (VANCOCIN) IVPB 1000 mg/200 mL premix     Discontinue     1,000 mg 200 mL/hr over 60 Minutes Intravenous Every 24 hours 04/26/20 0340     04/26/20 1000  posaconazole (NOXAFIL) delayed-release tablet 300 mg     Discontinue     300 mg Oral Daily 04/25/20 2216     04/26/20 1000  valACYclovir (VALTREX) tablet 500 mg     Discontinue     500 mg Oral Daily 04/25/20 2216     04/26/20 0800  azithromycin (ZITHROMAX) 500 mg in sodium chloride 0.9 % 250 mL IVPB      Discontinue     500 mg 250 mL/hr over 60 Minutes Intravenous Every 24 hours 04/25/20 2211     04/26/20 0800  ceFEPIme (MAXIPIME) 2 g in sodium chloride 0.9 % 100 mL IVPB     Discontinue     2 g 200 mL/hr over 30 Minutes Intravenous Every 12 hours 04/25/20 2313     04/25/20 1930  ceFEPIme (MAXIPIME) 2 g in sodium chloride 0.9 % 100 mL IVPB        2 g 200 mL/hr over 30 Minutes Intravenous  Once 04/25/20 1916 04/25/20 2009   04/25/20 1930  metroNIDAZOLE (FLAGYL) IVPB 500 mg        500 mg 100 mL/hr over 60 Minutes Intravenous  Once 04/25/20 1916 04/25/20 2140   04/25/20 1930  vancomycin (VANCOCIN) IVPB 1000 mg/200 mL premix        1,000 mg 200 mL/hr over 60 Minutes Intravenous  Once 04/25/20 1916 04/25/20 2139             Family Communication/Anticipated D/C date and plan/Code Status   DVT prophylaxis: heparin injection 5,000 Units Start: 04/25/20 2300     Code Status: Partial Code  Family Communication: Plan discussed with his wife at the bedside Disposition Plan:    Status is: Inpatient  Remains inpatient appropriate because:IV treatments appropriate due to intensity of illness or inability to take PO and Inpatient level of care appropriate due to severity of illness   Dispo: The patient is from: Home              Anticipated d/c is to: Home              Anticipated d/c date is: 2 days              Patient currently is not medically stable to d/c.           Subjective:   C/o watery stools.  He had about 4-5 watery stools yesterday but stools were scanty and nonbloody.  He said he had one loose stool this morning.  No vomiting or abdominal pain.  No shortness of breath or chest pain.    Objective:    Vitals:   04/27/20 2027 04/28/20 0347 04/28/20 0746 04/28/20 1152  BP: 124/78 111/69 112/66 104/67  Pulse: (!) 109 (!) 104 95 82  Resp:  18 18 20   Temp: 98.3 F (36.8 C) 99.1 F (37.3 C) 97.9 F (36.6 C) (!) 97.4 F (36.3 C)  TempSrc: Oral Oral Oral  Oral  SpO2: 100% 100% 100% 100%  Weight:  77.1 kg    Height:       No data found.   Intake/Output Summary (Last 24  hours) at 04/28/2020 1334 Last data filed at 04/28/2020 0414 Gross per 24 hour  Intake 240 ml  Output 650 ml  Net -410 ml   Filed Weights   04/26/20 0442 04/26/20 2137 04/28/20 0347  Weight: 74.8 kg 76.4 kg 77.1 kg    Exam:  GEN: No acute distress SKIN: Skin lesions on bilateral legs are improving. EYES: EOMI ENT: MMM CV: RRR PULM: No wheezing or rales heard ABD: soft, ND, NT, +BS CNS: AAO x 3, non focal EXT: Chronic b/l leg edema, no tenderness   Data Reviewed:   I have personally reviewed following labs and imaging studies:  Labs: Labs show the following:   Basic Metabolic Panel: Recent Labs  Lab 04/25/20 1909 04/25/20 1909 04/26/20 0508 04/26/20 0508 04/26/20 2202 04/26/20 2202 04/27/20 0054 04/28/20 0414  NA 127*  --  131*  --  130*  --  131* 129*  K 3.8   < > 3.4*   < > 4.0   < > 3.9 4.0  CL 96*  --  101  --  101  --  101 100  CO2 20*  --  22  --  17*  --  19* 18*  GLUCOSE 118*  --  93  --  189*  --  117* 96  BUN 34*  --  31*  --  37*  --  37* 47*  CREATININE 1.84*  --  1.60*  --  1.80*  --  1.88* 2.06*  CALCIUM 8.3*  --  8.2*  --  7.9*  --  8.0* 8.2*  MG  --   --  1.6*  --   --   --  1.7 1.6*  PHOS  --   --  4.2  --   --   --  4.7*  --    < > = values in this interval not displayed.   GFR Estimated Creatinine Clearance: 34.8 mL/min (A) (by C-G formula based on SCr of 2.06 mg/dL (H)). Liver Function Tests: Recent Labs  Lab 04/25/20 1909 04/28/20 0414  AST 52* 45*  ALT 13 10  ALKPHOS 89 70  BILITOT 0.7 1.0  PROT 8.8* 7.4  ALBUMIN 3.0* 2.3*   No results for input(s): LIPASE, AMYLASE in the last 168 hours. No results for input(s): AMMONIA in the last 168 hours. Coagulation profile Recent Labs  Lab 04/25/20 1909 04/26/20 0508  INR 1.3* 1.5*    CBC: Recent Labs  Lab 04/25/20 1909 04/26/20 0508 04/26/20 2202  04/27/20 0054 04/28/20 0414  WBC 1.3* 1.0* 2.6* 1.3* 1.2*  NEUTROABS 0.4* 0.3* 0.8* 0.4* PENDING  HGB 8.4* 6.9* 9.1* 8.7* 7.8*  HCT 25.7* 21.0* 28.0* 26.4* 24.2*  MCV 85.7 85.7 87.5 85.4 87.7  PLT 482* 347 526* 412* 382   Cardiac Enzymes: No results for input(s): CKTOTAL, CKMB, CKMBINDEX, TROPONINI in the last 168 hours. BNP (last 3 results) No results for input(s): PROBNP in the last 8760 hours. CBG: Recent Labs  Lab 04/26/20 2140  GLUCAP 179*   D-Dimer: No results for input(s): DDIMER in the last 72 hours. Hgb A1c: No results for input(s): HGBA1C in the last 72 hours. Lipid Profile: No results for input(s): CHOL, HDL, LDLCALC, TRIG, CHOLHDL, LDLDIRECT in the last 72 hours. Thyroid function studies: No results for input(s): TSH, T4TOTAL, T3FREE, THYROIDAB in the last 72 hours.  Invalid input(s): FREET3 Anemia work up: No results for input(s): VITAMINB12, FOLATE, FERRITIN, TIBC, IRON, RETICCTPCT in the last 72 hours. Sepsis Labs: Recent Labs  Lab 04/25/20 1909 04/25/20 2057 04/26/20 0508 04/26/20 2202 04/27/20 0054 04/28/20 0414  PROCALCITON  --   --  5.06  --  21.82  --   WBC   < >  --  1.0* 2.6* 1.3* 1.2*  LATICACIDVEN  --  2.4* 1.1 4.3* 2.4*  --    < > = values in this interval not displayed.    Microbiology Recent Results (from the past 240 hour(s))  Culture, blood (Routine x 2)     Status: None (Preliminary result)   Collection Time: 04/25/20  7:09 PM   Specimen: BLOOD  Result Value Ref Range Status   Specimen Description BLOOD BLOOD LEFT FOREARM  Final   Special Requests   Final    BOTTLES DRAWN AEROBIC AND ANAEROBIC Blood Culture adequate volume   Culture   Final    NO GROWTH 3 DAYS Performed at [facility], [address] [place], [place] [address]    Report Status PENDING  Incomplete  Culture, blood (Routine x 2)     Status: None (Preliminary result)   Collection Time: 04/25/20  7:12 PM   Specimen: BLOOD  Result Value Ref Range  Status   Specimen Description BLOOD BLOOD RIGHT FOREARM  Final   Special Requests   Final    BOTTLES DRAWN AEROBIC AND ANAEROBIC Blood Culture adequate volume   Culture   Final    NO GROWTH 3 DAYS Performed at [facility], [address], [place], [place] [address]    Report Status PENDING  Incomplete  Urine culture     Status: None   Collection Time: 04/25/20  7:41 PM   Specimen: In/Out Cath Urine  Result Value Ref Range Status   Specimen Description   Final    IN/OUT CATH URINE Performed at [facility], [address] [place], [place] [address]    Special Requests   Final    NONE Performed at [facility], [address] [place], [place] [address]    Culture   Final    NO GROWTH Performed at [facility], [place] [address] [place], [place] [address]    Report Status 04/26/2020 FINAL  Final  SARS Coronavirus 2 by RT PCR (hospital order, performed in [facility] hospital lab) Nasopharyngeal Nasopharyngeal Swab     Status: None   Collection Time: 04/25/20  8:12 PM   Specimen: Nasopharyngeal Swab  Result Value Ref Range Status   SARS Coronavirus 2 NEGATIVE NEGATIVE Final    Comment: (NOTE) SARS-CoV-2 target nucleic acids are NOT DETECTED.  The SARS-CoV-2 RNA is generally detectable in upper and lower respiratory specimens during the acute phase of infection. The lowest concentration of SARS-CoV-2 viral copies this assay can detect is 250 copies / mL. A negative result does not preclude SARS-CoV-2 infection and should not be used as the sole basis for treatment or other patient management decisions.  A negative result may occur with improper specimen collection / handling, submission of specimen other than nasopharyngeal swab, presence of viral mutation(s) within the areas targeted by this assay, and inadequate number of viral copies (<250 copies / mL). A negative result must be combined with clinical observations, patient history, and  epidemiological information.  Fact Sheet for Patients:   StrictlyIdeas.no  Fact Sheet for Healthcare Providers: BankingDealers.co.za  This test is not yet approved or  cleared by the [place] FDA and has been authorized for detection and/or diagnosis of SARS-CoV-2 by FDA under an Emergency Use  Authorization (EUA).  This EUA will remain in effect (meaning this test can be used) for the duration of the COVID-19 declaration under Section 564(b)(1) of the Act, 21 U.S.C. section 360bbb-3(b)(1), unless the authorization is terminated or revoked sooner.  Performed at [facility], [address] [place], [place] [address]   MRSA PCR Screening     Status: None   Collection Time: 04/26/20  9:42 PM   Specimen: Nasopharyngeal  Result Value Ref Range Status   MRSA by PCR NEGATIVE NEGATIVE Final    Comment:        The GeneXpert MRSA Assay (FDA approved for NASAL specimens only), is one component of a comprehensive MRSA colonization surveillance program. It is not intended to diagnose MRSA infection nor to guide or monitor treatment for MRSA infections. Performed at [facility], [address] [place], [place] [address]     Procedures and diagnostic studies:  DG Chest 1 View  Result Date: 04/26/2020 CLINICAL DATA:  Shortness of breath EXAM: CHEST  1 VIEW COMPARISON:  04/25/2020, CT 03/19/2020 FINDINGS: Stable cardiomediastinal silhouette. Interim development of vascular congestion and moderate diffuse bilateral interstitial opacities suspicious for pulmonary edema. More focal areas of airspace disease within the peripheral right greater than left lung suspicious for pneumonia. Possible tiny pleural effusions. No pneumothorax. IMPRESSION: Interim development of vascular congestion and moderate diffuse bilateral interstitial opacities suspicious for pulmonary edema. More focal areas of airspace disease within the  peripheral right greater than left lung suspicious for pneumonia. Electronically Signed   By: Donavan Foil M.D.   On: 04/26/2020 21:33               LOS: 3 days   Mackena Plummer  [facility]     04/28/2020, 1:34 PM

## 2020-04-28 NOTE — Progress Notes (Signed)
Reported off to oncoming nurse orders to remove foley. Patient was eating dinner and then went to bathroom when orders to remove were put in.

## 2020-04-29 ENCOUNTER — Inpatient Hospital Stay: Payer: Medicare Other

## 2020-04-29 DIAGNOSIS — N1832 Chronic kidney disease, stage 3b: Secondary | ICD-10-CM

## 2020-04-29 LAB — CBC WITH DIFFERENTIAL/PLATELET
Abs Immature Granulocytes: 0.04 10*3/uL (ref 0.00–0.07)
Basophils Absolute: 0 10*3/uL (ref 0.0–0.1)
Basophils Relative: 1 %
Eosinophils Absolute: 0 10*3/uL (ref 0.0–0.5)
Eosinophils Relative: 0 %
HCT: 23.1 % — ABNORMAL LOW (ref 39.0–52.0)
Hemoglobin: 7.6 g/dL — ABNORMAL LOW (ref 13.0–17.0)
Immature Granulocytes: 5 %
Lymphocytes Relative: 57 %
Lymphs Abs: 0.5 10*3/uL — ABNORMAL LOW (ref 0.7–4.0)
MCH: 27.5 pg (ref 26.0–34.0)
MCHC: 32.9 g/dL (ref 30.0–36.0)
MCV: 83.7 fL (ref 80.0–100.0)
Monocytes Absolute: 0.2 10*3/uL (ref 0.1–1.0)
Monocytes Relative: 22 %
Neutro Abs: 0.1 10*3/uL — ABNORMAL LOW (ref 1.7–7.7)
Neutrophils Relative %: 15 %
Platelets: 322 10*3/uL (ref 150–400)
RBC: 2.76 MIL/uL — ABNORMAL LOW (ref 4.22–5.81)
RDW: 20.1 % — ABNORMAL HIGH (ref 11.5–15.5)
Smear Review: ADEQUATE
WBC: 0.9 10*3/uL — CL (ref 4.0–10.5)
nRBC: 8 % — ABNORMAL HIGH (ref 0.0–0.2)

## 2020-04-29 LAB — RESPIRATORY PANEL BY PCR

## 2020-04-29 LAB — BASIC METABOLIC PANEL
Anion gap: 11 (ref 5–15)
BUN: 53 mg/dL — ABNORMAL HIGH (ref 8–23)
CO2: 18 mmol/L — ABNORMAL LOW (ref 22–32)
Calcium: 8.6 mg/dL — ABNORMAL LOW (ref 8.9–10.3)
Chloride: 101 mmol/L (ref 98–111)
Creatinine, Ser: 2.11 mg/dL — ABNORMAL HIGH (ref 0.61–1.24)
GFR calc Af Amer: 35 mL/min — ABNORMAL LOW (ref >60)
GFR calc non Af Amer: 30 mL/min — ABNORMAL LOW (ref >60)
Glucose, Bld: 109 mg/dL — ABNORMAL HIGH (ref 70–99)
Potassium: 3.5 mmol/L (ref 3.5–5.1)
Sodium: 130 mmol/L — ABNORMAL LOW (ref 135–145)

## 2020-04-29 LAB — PHOSPHORUS: Phosphorus: 4.5 mg/dL (ref 2.5–4.6)

## 2020-04-29 LAB — MAGNESIUM: Magnesium: 2 mg/dL (ref 1.7–2.4)

## 2020-04-29 LAB — CMV DNA, QUANTITATIVE, PCR
CMV DNA Quant: NEGATIVE IU/mL
Log10 CMV Qn DNA Pl: UNDETERMINED log10 IU/mL

## 2020-04-29 MED ORDER — POTASSIUM CHLORIDE CRYS ER 20 MEQ PO TBCR
40.0000 meq | EXTENDED_RELEASE_TABLET | Freq: Once | ORAL | Status: AC
  Administered 2020-04-29: 40 meq via ORAL
  Filled 2020-04-29: qty 2

## 2020-04-29 MED ORDER — FUROSEMIDE 10 MG/ML IJ SOLN
20.0000 mg | Freq: Once | INTRAMUSCULAR | Status: AC
  Administered 2020-04-29: 20 mg via INTRAVENOUS
  Filled 2020-04-29: qty 2

## 2020-04-29 NOTE — Progress Notes (Signed)
MEDICATION RELATED CONSULT NOTE - INITIAL   Pharmacy Consult to Review Medications for Renal Function and for any Medications that may be contributing to Leukopenia Indication: Febrile Neutropenia and Decreasing Renal Function  No Known Allergies  Patient Measurements: Height: 6' (182.9 cm) Weight: 77.4 kg (170 lb 10.2 oz) IBW/kg (Calculated) : 77.6 Adjusted Body Weight:    Vital Signs: Temp: 97.6 F (36.4 C) (08/08 1522) Temp Source: Oral (08/08 1522) BP: 120/75 (08/08 1522) Pulse Rate: 83 (08/08 1522) Intake/Output from previous day: 08/07 0701 - 08/08 0700 In: 1478.4 [P.O.:225; I.V.:228.4; IV Piggyback:850] Out: 775 [Urine:775] Intake/Output from this shift: Total I/O In: -  Out: 300 [Urine:300]  Labs: Recent Labs    04/27/20 0054 04/28/20 0414 04/29/20 0732  WBC 1.3* 1.2* 0.9*  HGB 8.7* 7.8* 7.6*  HCT 26.4* 24.2* 23.1*  PLT 412* 382 322  CREATININE 1.88* 2.06* 2.11*  MG 1.7 1.6* 2.0  PHOS 4.7*  --  4.5  ALBUMIN  --  2.3*  --   PROT  --  7.4  --   AST  --  45*  --   ALT  --  10  --   ALKPHOS  --  70  --   BILITOT  --  1.0  --    Estimated Creatinine Clearance: 34.1 mL/min (A) (by C-G formula based on SCr of 2.11 mg/dL (H)).   Microbiology: Recent Results (from the past 720 hour(s))  Culture, blood (Routine x 2)     Status: None (Preliminary result)   Collection Time: 04/25/20  7:09 PM   Specimen: BLOOD  Result Value Ref Range Status   Specimen Description BLOOD BLOOD LEFT FOREARM  Final   Special Requests   Final    BOTTLES DRAWN AEROBIC AND ANAEROBIC Blood Culture adequate volume   Culture   Final    NO GROWTH 4 DAYS Performed at [facility], [address] [place], [place] [address]    Report Status PENDING  Incomplete  Culture, blood (Routine x 2)     Status: None (Preliminary result)   Collection Time: 04/25/20  7:12 PM   Specimen: BLOOD  Result Value Ref Range Status   Specimen Description BLOOD BLOOD RIGHT FOREARM  Final    Special Requests   Final    BOTTLES DRAWN AEROBIC AND ANAEROBIC Blood Culture adequate volume   Culture   Final    NO GROWTH 4 DAYS Performed at [facility], [address] [place], [place] [address]    Report Status PENDING  Incomplete  Urine culture     Status: None   Collection Time: 04/25/20  7:41 PM   Specimen: In/Out Cath Urine  Result Value Ref Range Status   Specimen Description   Final    IN/OUT CATH URINE Performed at [facility], [address] [place], [place] [address]    Special Requests   Final    NONE Performed at [facility], [address], [place], [place] [address]    Culture   Final    NO GROWTH Performed at [facility], [place] [address] [place], [place] [address]    Report Status 04/26/2020 FINAL  Final  SARS Coronavirus 2 by RT PCR (hospital order, performed in [facility] hospital lab) Nasopharyngeal Nasopharyngeal Swab     Status: None   Collection Time: 04/25/20  8:12 PM   Specimen: Nasopharyngeal Swab  Result Value Ref Range Status   SARS Coronavirus 2 NEGATIVE NEGATIVE Final  Comment: (NOTE) SARS-CoV-2 target nucleic acids are NOT DETECTED.  The SARS-CoV-2 RNA is generally detectable in upper and lower respiratory specimens during the acute phase of infection. The lowest concentration of SARS-CoV-2 viral copies this assay can detect is 250 copies / mL. A negative result does not preclude SARS-CoV-2 infection and should not be used as the sole basis for treatment or other patient management decisions.  A negative result may occur with improper specimen collection / handling, submission of specimen other than nasopharyngeal swab, presence of viral mutation(s) within the areas targeted by this assay, and inadequate number of viral copies (<250 copies / mL). A negative result must be combined with clinical observations, patient history, and epidemiological information.  Fact Sheet for Patients:    StrictlyIdeas.no  Fact Sheet for Healthcare Providers: BankingDealers.co.za  This test is not yet approved or  cleared by the [place] FDA and has been authorized for detection and/or diagnosis of SARS-CoV-2 by FDA under an Emergency Use Authorization (EUA).  This EUA will remain in effect (meaning this test can be used) for the duration of the COVID-19 declaration under Section 564(b)(1) of the Act, 21 U.S.C. section 360bbb-3(b)(1), unless the authorization is terminated or revoked sooner.  Performed at [facility], [address] [place], [place] [address]   MRSA PCR Screening     Status: None   Collection Time: 04/26/20  9:42 PM   Specimen: Nasopharyngeal  Result Value Ref Range Status   MRSA by PCR NEGATIVE NEGATIVE Final    Comment:        The GeneXpert MRSA Assay (FDA approved for NASAL specimens only), is one component of a comprehensive MRSA colonization surveillance program. It is not intended to diagnose MRSA infection nor to guide or monitor treatment for MRSA infections. Performed at [facility], [address] [place], [place] [address]   Respiratory Panel by PCR     Status: None   Collection Time: 04/28/20  3:12 PM   Specimen: Nasopharyngeal Swab; Respiratory  Result Value Ref Range Status   Adenovirus NOT DETECTED NOT DETECTED Final   Coronavirus 229E NOT DETECTED NOT DETECTED Final    Comment: (NOTE) The Coronavirus on the Respiratory Panel, DOES NOT test for the novel  Coronavirus (2019 nCoV)    Coronavirus HKU1 NOT DETECTED NOT DETECTED Final   Coronavirus NL63 NOT DETECTED NOT DETECTED Final   Coronavirus OC43 NOT DETECTED NOT DETECTED Final   Metapneumovirus NOT DETECTED NOT DETECTED Final   Rhinovirus / Enterovirus NOT DETECTED NOT DETECTED Final   Influenza A NOT DETECTED NOT DETECTED Final   Influenza B NOT DETECTED NOT DETECTED Final   Parainfluenza Virus 1 NOT  DETECTED NOT DETECTED Final   Parainfluenza Virus 2 NOT DETECTED NOT DETECTED Final   Parainfluenza Virus 3 NOT DETECTED NOT DETECTED Final   Parainfluenza Virus 4 NOT DETECTED NOT DETECTED Final   Respiratory Syncytial Virus NOT DETECTED NOT DETECTED Final   Bordetella pertussis NOT DETECTED NOT DETECTED Final   Chlamydophila pneumoniae NOT DETECTED NOT DETECTED Final   Mycoplasma pneumoniae NOT DETECTED NOT DETECTED Final    Comment: Performed at [facility], [place]. [address] [place], [place] [address]    Medical History: Past Medical History:  Diagnosis Date  . Blood dyscrasia   . BPH (benign prostatic hyperplasia)   . Cancer ([place])    SKIN/ POLYCYTHEMIA VERA  . Chronic kidney disease    RENAL INSUFF (40%)  . Crohn disease ([place])   .  Crohn's disease ([place])   . Glaucoma   . Glaucoma   . Gout   . History of chicken pox   . Myelofibrosis ([place])   . Nosebleed   . RBBB   . Right bundle branch block     Assessment: Patient is a 74yo male with worsening renal function and leukopenia. Pharmacy consulted to review medications for renal function and for any medications that may be contributing to leukopenia.  Plan:  Reviewed all medications for renal adjustments. No changes to dosing are needed at this time. If renal function continues to trend down may need to adjust.  Reviewed current medications for any contribution to leukopenia. No current medications are contributing to leukopenia.  Will continue to follow for changes in renal function and for new medications.  Paulina Fusi, PharmD, BCPS 04/29/2020 5:07 PM

## 2020-04-29 NOTE — Progress Notes (Signed)
[facility]  ROUNDING NOTE   Subjective:   Mr. Ruben [name] admitted to [facility] on 04/25/2020 for Atypical pneumonia [J18.9] Neutropenic fever ([place]) [D70.9, R50.81] Sepsis ([place]) [A41.9] Sepsis due to pneumonia ([place]) [J18.9, A41.9] Sepsis without acute organ dysfunction, due to unspecified organism [facility]) [A41.9]  Patient has been afebrile for last 24 hours. Current antimicrobial regimen of posaconazole, valacyclovir and cefepime.   Nephrology consulted for acute on chronic kidney injury.   Wife at bedside.    Objective:  Vital signs in last 24 hours:  Temp:  [97.5 F (36.4 C)-98.3 F (36.8 C)] 97.6 F (36.4 C) (08/08 1205) Pulse Rate:  [82-93] 83 (08/08 1206) Resp:  [15-19] 18 (08/08 1205) BP: (99-124)/(59-77) 102/59 (08/08 1206) SpO2:  [97 %-100 %] 100 % (08/08 1205) Weight:  [77.4 kg] 77.4 kg (08/08 0459)  Weight change: 0.289 kg Filed Weights   04/26/20 2137 04/28/20 0347 04/29/20 0459  Weight: 76.4 kg 77.1 kg 77.4 kg    Intake/Output: I/O last 3 completed shifts: In: 1478.4 [P.O.:225; I.V.:228.4; Other:175; IV Piggyback:850] Out: 6237 [Urine:1425]   Intake/Output this shift:  Total I/O In: -  Out: 300 [Urine:300]  Physical Exam: General: NAD,   Head: Normocephalic, atraumatic. Moist oral mucosal membranes  Eyes: Anicteric, PERRL  Neck: Supple, trachea midline  Lungs:  Clear to auscultation  Heart: Regular rate and rhythm  Abdomen:  Soft, nontender,   Extremities:  + peripheral edema.  Neurologic: Nonfocal, moving all four extremities  Skin: No lesions        Basic Metabolic Panel: Recent Labs  Lab 04/26/20 0508 04/26/20 0508 04/26/20 2202 04/26/20 2202 04/27/20 0054 04/28/20 0414 04/29/20 0732  NA 131*  --  130*  --  131* 129* 130*  K 3.4*  --  4.0  --  3.9 4.0 3.5  CL 101  --  101  --  101 100 101  CO2 22  --  17*  --  19* 18* 18*  GLUCOSE 93  --  189*  --  117* 96 109*  BUN 31*  --  37*  --  37* 47* 53*  CREATININE 1.60*  --   1.80*  --  1.88* 2.06* 2.11*  CALCIUM 8.2*   < > 7.9*   < > 8.0* 8.2* 8.6*  MG 1.6*  --   --   --  1.7 1.6* 2.0  PHOS 4.2  --   --   --  4.7*  --  4.5   < > = values in this interval not displayed.    Liver Function Tests: Recent Labs  Lab 04/25/20 1909 04/28/20 0414  AST 52* 45*  ALT 13 10  ALKPHOS 89 70  BILITOT 0.7 1.0  PROT 8.8* 7.4  ALBUMIN 3.0* 2.3*   No results for input(s): LIPASE, AMYLASE in the last 168 hours. No results for input(s): AMMONIA in the last 168 hours.  CBC: Recent Labs  Lab 04/26/20 0508 04/26/20 2202 04/27/20 0054 04/28/20 0414 04/29/20 0732  WBC 1.0* 2.6* 1.3* 1.2* 0.9*  NEUTROABS 0.3* 0.8* 0.4* 0.3* 0.1*  HGB 6.9* 9.1* 8.7* 7.8* 7.6*  HCT 21.0* 28.0* 26.4* 24.2* 23.1*  MCV 85.7 87.5 85.4 87.7 83.7  PLT 347 526* 412* 382 322    Cardiac Enzymes: No results for input(s): CKTOTAL, CKMB, CKMBINDEX, TROPONINI in the last 168 hours.  BNP: Invalid input(s): POCBNP  CBG: Recent Labs  Lab 04/26/20 2140  GLUCAP 179*    Microbiology: Results for orders placed or performed during  the hospital encounter of 04/25/20  Culture, blood (Routine x 2)     Status: None (Preliminary result)   Collection Time: 04/25/20  7:09 PM   Specimen: BLOOD  Result Value Ref Range Status   Specimen Description BLOOD BLOOD LEFT FOREARM  Final   Special Requests   Final    BOTTLES DRAWN AEROBIC AND ANAEROBIC Blood Culture adequate volume   Culture   Final    NO GROWTH 4 DAYS Performed at [facility], [address], [place], [place] [address]    Report Status PENDING  Incomplete  Culture, blood (Routine x 2)     Status: None (Preliminary result)   Collection Time: 04/25/20  7:12 PM   Specimen: BLOOD  Result Value Ref Range Status   Specimen Description BLOOD BLOOD RIGHT FOREARM  Final   Special Requests   Final    BOTTLES DRAWN AEROBIC AND ANAEROBIC Blood Culture adequate volume   Culture   Final    NO GROWTH 4 DAYS Performed at [facility], [address] [place], [place] [address]    Report Status PENDING  Incomplete  Urine culture     Status: None   Collection Time: 04/25/20  7:41 PM   Specimen: In/Out Cath Urine  Result Value Ref Range Status   Specimen Description   Final    IN/OUT CATH URINE Performed at [facility], [address], [place], [place] [address]    Special Requests   Final    NONE Performed at [facility], [address], [place], [place] [address]    Culture   Final    NO GROWTH Performed at [facility], [place] [address] [place], [place] [address]    Report Status 04/26/2020 FINAL  Final  SARS Coronavirus 2 by RT PCR (hospital order, performed in [facility] hospital lab) Nasopharyngeal Nasopharyngeal Swab     Status: None   Collection Time: 04/25/20  8:12 PM   Specimen: Nasopharyngeal Swab  Result Value Ref Range Status   SARS Coronavirus 2 NEGATIVE NEGATIVE Final    Comment: (NOTE) SARS-CoV-2 target nucleic acids are NOT DETECTED.  The SARS-CoV-2 RNA is generally detectable in upper and lower respiratory specimens during the acute phase of infection. The lowest concentration of SARS-CoV-2 viral copies this assay can detect is 250 copies / mL. A negative result does not preclude SARS-CoV-2 infection and should not be used as the sole basis for treatment or other patient management decisions.  A negative result may occur with improper specimen collection / handling, submission of specimen other than nasopharyngeal swab, presence of viral mutation(s) within the areas targeted by this assay, and inadequate number of viral copies (<250 copies / mL). A negative result must be combined with clinical observations, patient history, and epidemiological information.  Fact Sheet for Patients:   StrictlyIdeas.no  Fact Sheet for Healthcare Providers: BankingDealers.co.za  This test is not yet approved or   cleared by the [place] FDA and has been authorized for detection and/or diagnosis of SARS-CoV-2 by FDA under an Emergency Use Authorization (EUA).  This EUA will remain in effect (meaning this test can be used) for the duration of the COVID-19 declaration under Section 564(b)(1) of the Act, 21 U.S.C. section 360bbb-3(b)(1), unless the authorization is terminated or revoked sooner.  Performed at [facility], [address] [place],  [address]   MRSA PCR Screening     Status: None   Collection Time: 04/26/20  9:42  PM   Specimen: Nasopharyngeal  Result Value Ref Range Status   MRSA by PCR NEGATIVE NEGATIVE Final    Comment:        The GeneXpert MRSA Assay (FDA approved for NASAL specimens only), is one component of a comprehensive MRSA colonization surveillance program. It is not intended to diagnose MRSA infection nor to guide or monitor treatment for MRSA infections. Performed at [facility], [address] [place], [place] [address]   Respiratory Panel by PCR     Status: None   Collection Time: 04/28/20  3:12 PM   Specimen: Nasopharyngeal Swab; Respiratory  Result Value Ref Range Status   Adenovirus NOT DETECTED NOT DETECTED Final   Coronavirus 229E NOT DETECTED NOT DETECTED Final    Comment: (NOTE) The Coronavirus on the Respiratory Panel, DOES NOT test for the novel  Coronavirus (2019 nCoV)    Coronavirus HKU1 NOT DETECTED NOT DETECTED Final   Coronavirus NL63 NOT DETECTED NOT DETECTED Final   Coronavirus OC43 NOT DETECTED NOT DETECTED Final   Metapneumovirus NOT DETECTED NOT DETECTED Final   Rhinovirus / Enterovirus NOT DETECTED NOT DETECTED Final   Influenza A NOT DETECTED NOT DETECTED Final   Influenza B NOT DETECTED NOT DETECTED Final   Parainfluenza Virus 1 NOT DETECTED NOT DETECTED Final   Parainfluenza Virus 2 NOT DETECTED NOT DETECTED Final   Parainfluenza Virus 3 NOT DETECTED NOT DETECTED Final   Parainfluenza Virus 4 NOT  DETECTED NOT DETECTED Final   Respiratory Syncytial Virus NOT DETECTED NOT DETECTED Final   Bordetella pertussis NOT DETECTED NOT DETECTED Final   Chlamydophila pneumoniae NOT DETECTED NOT DETECTED Final   Mycoplasma pneumoniae NOT DETECTED NOT DETECTED Final    Comment: Performed at [facility], [place]. [address] [place], [place] [address]    Coagulation Studies: No results for input(s): LABPROT, INR in the last 72 hours.  Urinalysis: No results for input(s): COLORURINE, LABSPEC, PHURINE, GLUCOSEU, HGBUR, BILIRUBINUR, KETONESUR, PROTEINUR, UROBILINOGEN, NITRITE, LEUKOCYTESUR in the last 72 hours.  Invalid input(s): APPERANCEUR    Imaging: No results found.   Medications:   . sodium chloride Stopped (04/28/20 0641)  . sodium chloride Stopped (04/27/20 0127)  . ceFEPime (MAXIPIME) IV 2 g (04/29/20 0840)   . allopurinol  150 mg Oral Daily  . feeding supplement  1 Container Oral TID BM  . finasteride  5 mg Oral Q supper  . folic acid  1 mg Oral Daily  . heparin  5,000 Units Subcutaneous Q8H  . latanoprost  1 drop Both Eyes QHS  . midodrine  5 mg Oral TID WC  . posaconazole  300 mg Oral Daily  . sodium bicarbonate  650 mg Oral BID  . sodium chloride flush  3 mL Intravenous Q12H  . timolol  1 drop Both Eyes Daily  . valACYclovir  500 mg Oral Daily  . vitamin B-12  1,000 mcg Oral Daily   sodium chloride, acetaminophen **OR** acetaminophen, ondansetron **OR** ondansetron (ZOFRAN) IV  Assessment/ Plan:  Mr. Ruben [name] is a 74 y.o. white male with AML, crohn's disease, gout, glaucoma, BPH, who is admitted to [facility] on 04/25/2020 for Atypical pneumonia [J18.9] Neutropenic fever ([place]) [D70.9, R50.81] Sepsis ([place]) [A41.9] Sepsis due to pneumonia ([place]) [J18.9, A41.9] Sepsis without acute organ dysfunction, due to unspecified organism ([place]) [A41.9]  1. Acute kidney injury with hyponatremia and metabolic acidosis on chronic kidney disease stage IIIB: Baseline creatinine of  1.96, GFR of 33 on 01/27/2020.  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  Acute kidney injury due to concurrent illness of sepsis, pneumonia.  Takes PO sodium bicarbonate at home. Not on an ACE-I/ARB on home regimen.   - Check renal ultrasound - IV furosemide 17m x 1.  - Continue sodium bicarbonate.   2. Anemia with kidney disease, AML, polycythemia, leukopenia.  Status post PRBC transfusion on 8/5. - Appreciate heme/onc input.    LOS: 4 Ledia [place] 8/8/20212:28 PM

## 2020-04-29 NOTE — Progress Notes (Signed)
[facility] Hematology/Oncology Progress Note  Date of admission: 04/25/2020  Hospital day:  04/29/2020  Chief Complaint: Ruben [name] is a 74 y.o. 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   Subjective:   Patient notes formed bowel movements.  Foley catheter out.  Patient out of bed and in chair today.  Social History: The patient is accompanied by his wife, Ruben [name], today.  Allergies: No Known Allergies  Scheduled Medications: . allopurinol  150 mg Oral Daily  . feeding supplement  1 Container Oral TID BM  . finasteride  5 mg Oral Q supper  . folic acid  1 mg Oral Daily  . furosemide  20 mg Intravenous Once  . heparin  5,000 Units Subcutaneous Q8H  . latanoprost  1 drop Both Eyes QHS  . midodrine  5 mg Oral TID WC  . posaconazole  300 mg Oral Daily  . sodium bicarbonate  650 mg Oral BID  . sodium chloride flush  3 mL Intravenous Q12H  . timolol  1 drop Both Eyes Daily  . valACYclovir  500 mg Oral Daily  . vitamin B-12  1,000 mcg Oral Daily    Review of Systems  Constitutional: Negative for chills, diaphoresis and fever.       Fatigue.  HENT: Negative.  Negative for congestion, ear pain, nosebleeds, sinus pain and sore throat.   Eyes: Negative.  Negative for blurred vision, double vision and photophobia.  Respiratory: Positive for cough (minumum) and shortness of breath (no change). Negative for hemoptysis, sputum production and wheezing.   Cardiovascular: Positive for leg swelling. Negative for palpitations and orthopnea.  Gastrointestinal: Negative for abdominal pain, blood in stool, constipation, diarrhea, heartburn, melena, nausea and vomiting.       Appetite fair.  Formed stool.  Genitourinary: Negative for dysuria, flank pain, frequency, hematuria and urgency.       Foley catheter out.   Musculoskeletal: Negative.  Negative for back pain, joint pain, myalgias and neck pain.  Skin: Negative.  Negative for itching and rash.       Dressing on bottom to prevent bed sores.  Neurological: Positive for weakness (generalized). Negative for dizziness, tingling, tremors, sensory change, focal weakness and headaches.  Endo/Heme/Allergies: Negative.  Does not bruise/bleed easily.  Psychiatric/Behavioral: Negative.  Negative for depression and memory loss. The patient is not nervous/anxious and does not have insomnia.    Vitals: Blood pressure (!) 102/59, pulse 83, temperature 97.6 F (36.4 C), temperature source Oral, resp. rate 18, height 6' (1.829 m), weight 170 lb 10.2 oz (77.4 kg), SpO2 100 %.   Physical Exam Constitutional:      Appearance: He is not diaphoretic.     Interventions: Face mask in place.     Comments: Slightly fatigued appearing gentleman sitting in the bedside lounge chair in no acute distress.  HENT:     Head: Normocephalic and atraumatic.     Comments: Short hair.    Mouth/Throat:     Mouth: Mucous membranes are moist.     Pharynx: Oropharynx is clear. No oropharyngeal exudate or posterior oropharyngeal erythema.  Eyes:     General: No scleral icterus.    Extraocular Movements: Extraocular movements intact.     Conjunctiva/sclera: Conjunctivae normal.     Pupils: Pupils are equal, round, and reactive to light.     Comments: Glasses.  Cardiovascular:  Rate and Rhythm: Normal rate and regular rhythm.     Pulses: Normal pulses.     Heart sounds: Murmur (soft left lower sternal border) heard.  No friction rub. No gallop.   Pulmonary:     Effort: Pulmonary effort is normal.     Breath sounds: No wheezing, rhonchi or rales.  Abdominal:     General: Bowel sounds are normal. There is no distension.     Tenderness: There is no abdominal tenderness. There is no guarding or rebound.  Musculoskeletal:        General: Swelling (1-2+) present. No tenderness.  Normal range of motion.     Cervical back: Normal range of motion and neck supple.  Lymphadenopathy:     Head:     Right side of head: No preauricular, posterior auricular or occipital adenopathy.     Left side of head: No preauricular, posterior auricular or occipital adenopathy.     Cervical: No cervical adenopathy.     Upper Body:     Right upper body: No supraclavicular or axillary adenopathy.     Left upper body: No supraclavicular or axillary adenopathy.  Skin:    General: Skin is warm and dry.     Coloration: Skin is not jaundiced or pale.     Findings: No bruising, erythema, lesion or rash.  Neurological:     General: No focal deficit present.     Mental Status: He is alert and oriented to person, place, and time. Mental status is at baseline.  Psychiatric:        Mood and Affect: Mood normal.        Behavior: Behavior normal.        Thought Content: Thought content normal.        Judgment: Judgment normal.     Results for orders placed or performed during the hospital encounter of 04/25/20 (from the past 48 hour(s))  Troponin I (High Sensitivity)     Status: Abnormal   Collection Time: 04/27/20  6:06 PM  Result Value Ref Range   Troponin I (High Sensitivity) 147 (HH) <18 ng/L    Comment: CRITICAL VALUE NOTED. VALUE IS CONSISTENT WITH PREVIOUSLY REPORTED/CALLED VALUE MJU (NOTE) Elevated high sensitivity troponin I (hsTnI) values and significant  changes across serial measurements may suggest ACS but many other  chronic and acute conditions are known to elevate hsTnI results.  Refer to the "Links" section for chest pain algorithms and additional  guidance. Performed at [facility] [address] [place], [place] [address]   Comprehensive metabolic panel     Status: Abnormal   Collection Time: 04/28/20  4:14 AM  Result Value Ref Range   Sodium 129 (L) 135 - 145 mmol/L   Potassium 4.0 3.5 - 5.1 mmol/L   Chloride 100 98 - 111 mmol/L   CO2 18 (L) 22 - 32  mmol/L   Glucose, Bld 96 70 - 99 mg/dL    Comment: Glucose reference range applies only to samples taken after fasting for at least 8 hours.   BUN 47 (H) 8 - 23 mg/dL   Creatinine, Ser 2.06 (H) 0.61 - 1.24 mg/dL   Calcium 8.2 (L) 8.9 - 10.3 mg/dL   Total Protein 7.4 6.5 - 8.1 g/dL   Albumin 2.3 (L) 3.5 - 5.0 g/dL   AST 45 (H) 15 - 41 U/L   ALT 10 0 - 44 U/L   Alkaline Phosphatase 70 38 - 126 U/L   Total Bilirubin 1.0 0.3 -  1.2 mg/dL   GFR calc non Af Amer 31 (L) >60 mL/min   GFR calc Af Amer 36 (L) >60 mL/min   Anion gap 11 5 - 15    Comment: Performed at [facility], [address] [place], [place] [address]  Magnesium     Status: Abnormal   Collection Time: 04/28/20  4:14 AM  Result Value Ref Range   Magnesium 1.6 (L) 1.7 - 2.4 mg/dL    Comment: Performed at [facility], [address] [place], [place] [address]  CBC with Differential/Platelet     Status: Abnormal   Collection Time: 04/28/20  4:14 AM  Result Value Ref Range   WBC 1.2 (LL) 4.0 - 10.5 K/uL    Comment: CRITICAL VALUE NOTED.  VALUE IS CONSISTENT WITH PREVIOUSLY REPORTED AND CALLED VALUE.   RBC 2.76 (L) 4.22 - 5.81 MIL/uL   Hemoglobin 7.8 (L) 13.0 - 17.0 g/dL   HCT 24.2 (L) 39 - 52 %   MCV 87.7 80.0 - 100.0 fL   MCH 28.3 26.0 - 34.0 pg   MCHC 32.2 30.0 - 36.0 g/dL   RDW 20.2 (H) 11.5 - 15.5 %   Platelets 382 150 - 400 K/uL   nRBC 10.3 (H) 0.0 - 0.2 %   Neutrophils Relative % 21 %   Neutro Abs 0.3 (L) 1.7 - 7.7 K/uL   Lymphocytes Relative 44 %   Lymphs Abs 0.5 (L) 0.7 - 4.0 K/uL   Monocytes Relative 32 %   Monocytes Absolute 0.4 0 - 1 K/uL   Eosinophils Relative 0 %   Eosinophils Absolute 0.0 0 - 0 K/uL   Basophils Relative 0 %   Basophils Absolute 0.0 0 - 0 K/uL   Smear Review PLATELETS APPEAR ADEQUATE    Immature Granulocytes 3 %   Abs Immature Granulocytes 0.04 0.00 - 0.07 K/uL   Abnormal Lymphocytes Present PRESENT    Schistocytes PRESENT    Polychromasia PRESENT    Giant  PLTs PRESENT     Comment: Performed at [facility], [address] [place], [place] [address]  Respiratory Panel by PCR     Status: None   Collection Time: 04/28/20  3:12 PM   Specimen: Nasopharyngeal Swab; Respiratory  Result Value Ref Range   Adenovirus NOT DETECTED NOT DETECTED   Coronavirus 229E NOT DETECTED NOT DETECTED    Comment: (NOTE) The Coronavirus on the Respiratory Panel, DOES NOT test for the novel  Coronavirus (2019 nCoV)    Coronavirus HKU1 NOT DETECTED NOT DETECTED   Coronavirus NL63 NOT DETECTED NOT DETECTED   Coronavirus OC43 NOT DETECTED NOT DETECTED   Metapneumovirus NOT DETECTED NOT DETECTED   Rhinovirus / Enterovirus NOT DETECTED NOT DETECTED   Influenza A NOT DETECTED NOT DETECTED   Influenza B NOT DETECTED NOT DETECTED   Parainfluenza Virus 1 NOT DETECTED NOT DETECTED   Parainfluenza Virus 2 NOT DETECTED NOT DETECTED   Parainfluenza Virus 3 NOT DETECTED NOT DETECTED   Parainfluenza Virus 4 NOT DETECTED NOT DETECTED   Respiratory Syncytial Virus NOT DETECTED NOT DETECTED   Bordetella pertussis NOT DETECTED NOT DETECTED   Chlamydophila pneumoniae NOT DETECTED NOT DETECTED   Mycoplasma pneumoniae NOT DETECTED NOT DETECTED    Comment: Performed at [facility], [address] [place], [place] [address]  Basic metabolic panel     Status: Abnormal   Collection Time: 04/29/20  7:32 AM  Result Value Ref Range   Sodium 130 (L) 135 -  145 mmol/L   Potassium 3.5 3.5 - 5.1 mmol/L   Chloride 101 98 - 111 mmol/L   CO2 18 (L) 22 - 32 mmol/L   Glucose, Bld 109 (H) 70 - 99 mg/dL    Comment: Glucose reference range applies only to samples taken after fasting for at least 8 hours.   BUN 53 (H) 8 - 23 mg/dL   Creatinine, Ser 2.11 (H) 0.61 - 1.24 mg/dL   Calcium 8.6 (L) 8.9 - 10.3 mg/dL   GFR calc non Af Amer 30 (L) >60 mL/min   GFR calc Af Amer 35 (L) >60 mL/min   Anion gap 11 5 - 15    Comment: Performed at [facility], [address] [place], [place] [address]  Magnesium     Status: None   Collection Time: 04/29/20  7:32 AM  Result Value Ref Range   Magnesium 2.0 1.7 - 2.4 mg/dL    Comment: Performed at [facility], [address] [place], [place] [address]  CBC with Differential/Platelet     Status: Abnormal   Collection Time: 04/29/20  7:32 AM  Result Value Ref Range   WBC 0.9 (LL) 4.0 - 10.5 K/uL    Comment: CRITICAL VALUE NOTED.  VALUE IS CONSISTENT WITH PREVIOUSLY REPORTED AND CALLED VALUE.   RBC 2.76 (L) 4.22 - 5.81 MIL/uL   Hemoglobin 7.6 (L) 13.0 - 17.0 g/dL   HCT 23.1 (L) 39 - 52 %   MCV 83.7 80.0 - 100.0 fL   MCH 27.5 26.0 - 34.0 pg   MCHC 32.9 30.0 - 36.0 g/dL   RDW 20.1 (H) 11.5 - 15.5 %   Platelets 322 150 - 400 K/uL   nRBC 8.0 (H) 0.0 - 0.2 %   Neutrophils Relative % 15 %   Neutro Abs 0.1 (L) 1.7 - 7.7 K/uL   Lymphocytes Relative 57 %   Lymphs Abs 0.5 (L) 0.7 - 4.0 K/uL   Monocytes Relative 22 %   Monocytes Absolute 0.2 0 - 1 K/uL   Eosinophils Relative 0 %   Eosinophils Absolute 0.0 0 - 0 K/uL   Basophils Relative 1 %   Basophils Absolute 0.0 0 - 0 K/uL   Smear Review PLATELETS APPEAR ADEQUATE    Immature Granulocytes 5 %   Abs Immature Granulocytes 0.04 0.00 - 0.07 K/uL   Abnormal Lymphocytes Present PRESENT    Polychromasia PRESENT    Giant PLTs PRESENT     Comment: Performed at [facility], [address], [place], [place] [address]  Phosphorus     Status: None   Collection Time: 04/29/20  7:32 AM  Result Value Ref Range   Phosphorus 4.5 2.5 - 4.6 mg/dL    Comment: Performed at [facility], [address] [place], [place] [address]  Type and screen [place]     Status: None (Preliminary result)   Collection Time: 04/29/20  2:07 PM  Result Value Ref Range   ABO/RH(D) PENDING    Antibody Screen PENDING    Sample Expiration      05/02/2020,2359 Performed at [facility], [address] [place], [place] [address]     No results found.  Assessment:  Ruben [name] is a 74 y.o. 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  He was admitted with fever and neutropenia.  He has had a history of pneumonia since 02/2020.  Chest  x-ray on 04/26/2020 revealed interval development of vascular congestion and moderate diffuse bilateral interstitial opacities suspicious for pulmonary edema.  There are more focal areas of airspace disease within the peripheral right> left lung suspicious for pneumonia   He has Crohn's colitis.  Symptomatically, he is doing slightly better.  Diarrhea has resolved.  He is out of bed.  He is trying to eat more.  Plan:   1.   Acute myelogenous leukemia             Patient transformed from polycythemia rubra vera.             He is day 29 of cycle #1 azacitidine + Venetoclax.             Venetoclax remains on hold.             Review plans for bone marrow biopsy this week- message sent to Dr Evelene Croon [facility] or [facility]). 2.   Anemia and leukopenia             Hematocrit 26.4.  Hemoglobin 8.7.  MCV 85.4.  Platelets 412,000.  WBC 1300 with an [place] of 400 on 04/27/2020.  Hematocrit 24.2.  Hemoglobin 7.8.  MCV 87.7.  Platelets 382,000.  WBC 1200 with an [place] of 300 on 04/28/2020.  Hematocrit 23.1.  Hemoglobin 7.6.  MCV 83.7.  Platelets 322,000.  WBC   900 with an [place] of 100 on 04/29/2020.   Monocytes are 22% which suggest a recovering marrow s/p chemotherapy.   However, today's counts are lower.     Peripheral smear in AM to r/o increased blasts (progressive disease).   Medications reviewed by pharmacy to ensure no contributing meds to leukopenia.             Type and screen sent today.  Transfuse to maintain Hgb > 8.0.   Retic count sent.     If retic is low with continued decrease in hemoglobin, will transfuse 1 unit leukopoor irradiated PRBCs in AM.             ALL products leukopoor and irradiated.              Continue neutropenic precuations.             CBC with differential daily. 3.   Fever and neutropenia             Patient remains afebrile.  Patient has had infiltrates prior to initiation of induction chemotherapy.             CXR at [facility] showed improvement.             Patient remains on broad-spectrum antibiotics (Cefepime, posaconazole, and valacyclovir).              Pharmacy contacted regarding dose adjustment for renal function. 4.   Renal insufficiency  Creatinine 1.60 on 04/26/2020 and 2.11 today.  Patient has chronic renal insufficiency exacerbated by recent infection/sepsis.  Patient utilizes I/O catheterization.  He received Lasix while in ICU.  Nephrology consulted regarding optimization. 5.   Poor oral intake             Patient eating 1/2 portions.  Encourage caloric intake and Boost TID with meals. 6.   Disposition  Discuss plan for converting patient to oral antibiotics and possible discharge tomorrow or Tuesday. 7.   Code status             Partial code per chart.    Lequita Asal,  MD  04/29/2020, 3:19 PM

## 2020-04-29 NOTE — Progress Notes (Addendum)
Progress Note    Ruben [name]  YIR:[id number] DOB: Jul 24, 1946  DOA: 04/25/2020 PCP: [name], MD      Brief Narrative:    Medical records reviewed and are as summarized below:  Ruben [name] is a 74 y.o. male       Assessment/Plan:   Principal Problem:   Sepsis due to pneumonia [facility]) Active Problems:   Crohn's disease of small intestine without complication ([place])   Hyponatremia   CKD (chronic kidney disease) stage 3, GFR 30-59 ml/min   Normocytic anemia   Acute myeloid leukemia not having achieved remission (HCC)   Febrile neutropenia ([place])   Leukopenia due to antineoplastic chemotherapy ([place])   Sepsis secondary to pneumonia, neutropenic fever: Vancomycin was discontinued on 04/28/2020.  Discontinue IV azithromycin.  Continue IV cefepime.  Follow-up with ID and oncologist.  WBC is trending down (getting worrse).  Acute hypoxemic respiratory failure secondary to acute diastolic CHF: Resolved.  He is off of BiPAP and he is tolerating room air.  Volume overload from IV fluids and blood transfusion likely contributed to acute CHF.  2D echo showed EF estimated at 55 to 60%, mild aortic stenosis, mild to moderate MR.  Elevated troponin: This is likely due to demand ischemia from hypoxia and acute CHF  CKD stage IIIb metabolic acidosis: Chart review showed that patient has had CKD  3b for at least 1 year.  IV fluids has been discontinued.  Nephrology consult was requested by oncologist.  Order for IV Lasix 20 mg x 1 dose noted.  Continue sodium bicarbonate   Severe anemia, normocytic: Hemoglobin is trending down.  No indication for blood transfusion at this time.  S/p transfusion with 1 unit of packed red blood cells on 04/26/2020. He has had multiple blood and platelet transfusions in the past.  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  Continue allopurinol, Valtrex and posaconazole for ATLS, CMV and  fungal infection prophylaxis.  Hypokalemia: Potassium was borderline low.  Replete potassium.  Hypomagnesemia: Improved  Acute on chronic hyponatremia: Sodium level is close to baseline.  Crohn's disease, diarrhea: Diarrhea has improved.  Chronic urinary retention: Foley catheter was discontinued yesterday.  Resume in and out catheterization as needed.   Body mass index is 23.14 kg/m.  Diet Order            Diet regular Room service appropriate? Yes; Fluid consistency: Thin  Diet effective now                       Medications:   . allopurinol  150 mg Oral Daily  . feeding supplement  1 Container Oral TID BM  . finasteride  5 mg Oral Q supper  . folic acid  1 mg Oral Daily  . furosemide  20 mg Intravenous Once  . heparin  5,000 Units Subcutaneous Q8H  . latanoprost  1 drop Both Eyes QHS  . midodrine  5 mg Oral TID WC  . posaconazole  300 mg Oral Daily  . sodium bicarbonate  650 mg Oral BID  . sodium chloride flush  3 mL Intravenous Q12H  . timolol  1 drop Both Eyes Daily  . valACYclovir  500 mg Oral Daily  . vitamin B-12  1,000 mcg Oral Daily   Continuous Infusions: . sodium chloride Stopped (04/28/20 0641)  . sodium chloride Stopped (04/27/20 0127)  . ceFEPime (MAXIPIME) IV 2 g (04/29/20 0840)  Anti-infectives (From admission, onward)   Start     Dose/Rate Route Frequency Ordered Stop   04/26/20 1200  vancomycin (VANCOCIN) IVPB 1000 mg/200 mL premix  Status:  Discontinued        1,000 mg 200 mL/hr over 60 Minutes Intravenous Every 24 hours 04/26/20 0340 04/28/20 1648   04/26/20 1000  posaconazole (NOXAFIL) delayed-release tablet 300 mg     Discontinue     300 mg Oral Daily 04/25/20 2216     04/26/20 1000  valACYclovir (VALTREX) tablet 500 mg     Discontinue     500 mg Oral Daily 04/25/20 2216     04/26/20 0800  azithromycin (ZITHROMAX) 500 mg in sodium chloride 0.9 % 250 mL IVPB  Status:  Discontinued        500 mg 250 mL/hr over 60 Minutes  Intravenous Every 24 hours 04/25/20 2211 04/29/20 1252   04/26/20 0800  ceFEPIme (MAXIPIME) 2 g in sodium chloride 0.9 % 100 mL IVPB     Discontinue     2 g 200 mL/hr over 30 Minutes Intravenous Every 12 hours 04/25/20 2313     04/25/20 1930  ceFEPIme (MAXIPIME) 2 g in sodium chloride 0.9 % 100 mL IVPB        2 g 200 mL/hr over 30 Minutes Intravenous  Once 04/25/20 1916 04/25/20 2009   04/25/20 1930  metroNIDAZOLE (FLAGYL) IVPB 500 mg        500 mg 100 mL/hr over 60 Minutes Intravenous  Once 04/25/20 1916 04/25/20 2140   04/25/20 1930  vancomycin (VANCOCIN) IVPB 1000 mg/200 mL premix        1,000 mg 200 mL/hr over 60 Minutes Intravenous  Once 04/25/20 1916 04/25/20 2139             Family Communication/Anticipated D/C date and plan/Code Status   DVT prophylaxis: heparin injection 5,000 Units Start: 04/25/20 2300.  He has been refusing heparin because of concern for bleeding.     Code Status: Partial Code  Family Communication: Plan discussed with his wife at the bedside Disposition Plan:    Status is: Inpatient  Remains inpatient appropriate because:IV treatments appropriate due to intensity of illness or inability to take PO and Inpatient level of care appropriate due to severity of illness   Dispo: The patient is from: Home              Anticipated d/c is to: Home              Anticipated d/c date is: 2 days              Patient currently is not medically stable to d/c.           Subjective:   He feels better.  Diarrhea has improved.  No shortness of breath or chest pain.    Objective:    Vitals:   04/29/20 0727 04/29/20 1205 04/29/20 1206 04/29/20 1522  BP: 113/63 99/62 (!) 102/59 120/75  Pulse: 89 84 83 83  Resp: 18 18  18   Temp: 98.3 F (36.8 C) 97.6 F (36.4 C)  97.6 F (36.4 C)  TempSrc: Oral Oral  Oral  SpO2: 100% 100%  100%  Weight:      Height:       No data found.   Intake/Output Summary (Last 24 hours) at 04/29/2020 1542 Last  data filed at 04/29/2020 1100 Gross per 24 hour  Intake 400 ml  Output 1075 ml  Net -675 ml  Filed Weights   04/26/20 2137 04/28/20 0347 04/29/20 0459  Weight: 76.4 kg 77.1 kg 77.4 kg    Exam:  GEN: No acute distress SKIN: Skin lesions on bilateral legs are improving. EYES: Pale but anicteric ENT: MMM CV: RRR PULM: Clear to auscultation bilaterally ABD: soft, ND, NT, +BS CNS: AAO x 3, non focal EXT: Chronic b/l leg edema, no tenderness   Data Reviewed:   I have personally reviewed following labs and imaging studies:  Labs: Labs show the following:   Basic Metabolic Panel: Recent Labs  Lab 04/26/20 0508 04/26/20 0508 04/26/20 2202 04/26/20 2202 04/27/20 0054 04/27/20 0054 04/28/20 0414 04/29/20 0732  NA 131*  --  130*  --  131*  --  129* 130*  K 3.4*   < > 4.0   < > 3.9   < > 4.0 3.5  CL 101  --  101  --  101  --  100 101  CO2 22  --  17*  --  19*  --  18* 18*  GLUCOSE 93  --  189*  --  117*  --  96 109*  BUN 31*  --  37*  --  37*  --  47* 53*  CREATININE 1.60*  --  1.80*  --  1.88*  --  2.06* 2.11*  CALCIUM 8.2*  --  7.9*  --  8.0*  --  8.2* 8.6*  MG 1.6*  --   --   --  1.7  --  1.6* 2.0  PHOS 4.2  --   --   --  4.7*  --   --  4.5   < > = values in this interval not displayed.   GFR Estimated Creatinine Clearance: 34.1 mL/min (A) (by C-G formula based on SCr of 2.11 mg/dL (H)). Liver Function Tests: Recent Labs  Lab 04/25/20 1909 04/28/20 0414  AST 52* 45*  ALT 13 10  ALKPHOS 89 70  BILITOT 0.7 1.0  PROT 8.8* 7.4  ALBUMIN 3.0* 2.3*   No results for input(s): LIPASE, AMYLASE in the last 168 hours. No results for input(s): AMMONIA in the last 168 hours. Coagulation profile Recent Labs  Lab 04/25/20 1909 04/26/20 0508  INR 1.3* 1.5*    CBC: Recent Labs  Lab 04/26/20 0508 04/26/20 2202 04/27/20 0054 04/28/20 0414 04/29/20 0732  WBC 1.0* 2.6* 1.3* 1.2* 0.9*  NEUTROABS 0.3* 0.8* 0.4* 0.3* 0.1*  HGB 6.9* 9.1* 8.7* 7.8* 7.6*  HCT 21.0*  28.0* 26.4* 24.2* 23.1*  MCV 85.7 87.5 85.4 87.7 83.7  PLT 347 526* 412* 382 322   Cardiac Enzymes: No results for input(s): CKTOTAL, CKMB, CKMBINDEX, TROPONINI in the last 168 hours. BNP (last 3 results) No results for input(s): PROBNP in the last 8760 hours. CBG: Recent Labs  Lab 04/26/20 2140  GLUCAP 179*   D-Dimer: No results for input(s): DDIMER in the last 72 hours. Hgb A1c: No results for input(s): HGBA1C in the last 72 hours. Lipid Profile: No results for input(s): CHOL, HDL, LDLCALC, TRIG, CHOLHDL, LDLDIRECT in the last 72 hours. Thyroid function studies: No results for input(s): TSH, T4TOTAL, T3FREE, THYROIDAB in the last 72 hours.  Invalid input(s): FREET3 Anemia work up: No results for input(s): VITAMINB12, FOLATE, FERRITIN, TIBC, IRON, RETICCTPCT in the last 72 hours. Sepsis Labs: Recent Labs  Lab 04/25/20 1909 04/25/20 2057 04/26/20 5974 04/26/20 1638 04/26/20 2202 04/27/20 0054 04/28/20 0414 04/29/20 0732  PROCALCITON  --   --  5.06  --   --  21.82  --   --   WBC   < >  --  1.0*   < > 2.6* 1.3* 1.2* 0.9*  LATICACIDVEN  --  2.4* 1.1  --  4.3* 2.4*  --   --    < > = values in this interval not displayed.    Microbiology Recent Results (from the past 240 hour(s))  Culture, blood (Routine x 2)     Status: None (Preliminary result)   Collection Time: 04/25/20  7:09 PM   Specimen: BLOOD  Result Value Ref Range Status   Specimen Description BLOOD BLOOD LEFT FOREARM  Final   Special Requests   Final    BOTTLES DRAWN AEROBIC AND ANAEROBIC Blood Culture adequate volume   Culture   Final    NO GROWTH 4 DAYS Performed at [facility], [address] [place], [place] [address]    Report Status PENDING  Incomplete  Culture, blood (Routine x 2)     Status: None (Preliminary result)   Collection Time: 04/25/20  7:12 PM   Specimen: BLOOD  Result Value Ref Range Status   Specimen Description BLOOD BLOOD RIGHT FOREARM  Final   Special Requests    Final    BOTTLES DRAWN AEROBIC AND ANAEROBIC Blood Culture adequate volume   Culture   Final    NO GROWTH 4 DAYS Performed at [facility], [address], [place], [place] [address]    Report Status PENDING  Incomplete  Urine culture     Status: None   Collection Time: 04/25/20  7:41 PM   Specimen: In/Out Cath Urine  Result Value Ref Range Status   Specimen Description   Final    IN/OUT CATH URINE Performed at [facility], [address], [place], [place] [address]    Special Requests   Final    NONE Performed at [facility], [address] [place], [place] [address]    Culture   Final    NO GROWTH Performed at [facility], [place] [address] [place], [place] [address]    Report Status 04/26/2020 FINAL  Final  SARS Coronavirus 2 by RT PCR (hospital order, performed in [facility] hospital lab) Nasopharyngeal Nasopharyngeal Swab     Status: None   Collection Time: 04/25/20  8:12 PM   Specimen: Nasopharyngeal Swab  Result Value Ref Range Status   SARS Coronavirus 2 NEGATIVE NEGATIVE Final    Comment: (NOTE) SARS-CoV-2 target nucleic acids are NOT DETECTED.  The SARS-CoV-2 RNA is generally detectable in upper and lower respiratory specimens during the acute phase of infection. The lowest concentration of SARS-CoV-2 viral copies this assay can detect is 250 copies / mL. A negative result does not preclude SARS-CoV-2 infection and should not be used as the sole basis for treatment or other patient management decisions.  A negative result may occur with improper specimen collection / handling, submission of specimen other than nasopharyngeal swab, presence of viral mutation(s) within the areas targeted by this assay, and inadequate number of viral copies (<250 copies / mL). A negative result must be combined with clinical observations, patient history, and epidemiological information.  Fact Sheet for Patients:     StrictlyIdeas.no  Fact Sheet for Healthcare Providers: BankingDealers.co.za  This test is not yet approved or  cleared by the [place] FDA and has been authorized for detection and/or diagnosis of SARS-CoV-2 by FDA under an Emergency Use Authorization (EUA).  This EUA will remain in effect (meaning  this test can be used) for the duration of the COVID-19 declaration under Section 564(b)(1) of the Act, 21 U.S.C. section 360bbb-3(b)(1), unless the authorization is terminated or revoked sooner.  Performed at [facility], [address] [place], [place] [address]   MRSA PCR Screening     Status: None   Collection Time: 04/26/20  9:42 PM   Specimen: Nasopharyngeal  Result Value Ref Range Status   MRSA by PCR NEGATIVE NEGATIVE Final    Comment:        The GeneXpert MRSA Assay (FDA approved for NASAL specimens only), is one component of a comprehensive MRSA colonization surveillance program. It is not intended to diagnose MRSA infection nor to guide or monitor treatment for MRSA infections. Performed at [facility], [address] [place], [place] [address]   Respiratory Panel by PCR     Status: None   Collection Time: 04/28/20  3:12 PM   Specimen: Nasopharyngeal Swab; Respiratory  Result Value Ref Range Status   Adenovirus NOT DETECTED NOT DETECTED Final   Coronavirus 229E NOT DETECTED NOT DETECTED Final    Comment: (NOTE) The Coronavirus on the Respiratory Panel, DOES NOT test for the novel  Coronavirus (2019 nCoV)    Coronavirus HKU1 NOT DETECTED NOT DETECTED Final   Coronavirus NL63 NOT DETECTED NOT DETECTED Final   Coronavirus OC43 NOT DETECTED NOT DETECTED Final   Metapneumovirus NOT DETECTED NOT DETECTED Final   Rhinovirus / Enterovirus NOT DETECTED NOT DETECTED Final   Influenza A NOT DETECTED NOT DETECTED Final   Influenza B NOT DETECTED NOT DETECTED Final   Parainfluenza Virus 1 NOT  DETECTED NOT DETECTED Final   Parainfluenza Virus 2 NOT DETECTED NOT DETECTED Final   Parainfluenza Virus 3 NOT DETECTED NOT DETECTED Final   Parainfluenza Virus 4 NOT DETECTED NOT DETECTED Final   Respiratory Syncytial Virus NOT DETECTED NOT DETECTED Final   Bordetella pertussis NOT DETECTED NOT DETECTED Final   Chlamydophila pneumoniae NOT DETECTED NOT DETECTED Final   Mycoplasma pneumoniae NOT DETECTED NOT DETECTED Final    Comment: Performed at [facility], [place]. [address], [place], [place] [address]    Procedures and diagnostic studies:  No results found.             LOS: 4 days   Suheyla Mortellaro  [facility]     04/29/2020, 3:42 PM

## 2020-04-30 LAB — RETICULOCYTES
Immature Retic Fract: 11.4 % (ref 2.3–15.9)
RBC.: 2.67 MIL/uL — ABNORMAL LOW (ref 4.22–5.81)
Retic Count, Absolute: 13.6 10*3/uL — ABNORMAL LOW (ref 19.0–186.0)
Retic Ct Pct: 0.5 % (ref 0.4–3.1)

## 2020-04-30 LAB — RENAL FUNCTION PANEL
Albumin: 2.2 g/dL — ABNORMAL LOW (ref 3.5–5.0)
Anion gap: 9 (ref 5–15)
BUN: 52 mg/dL — ABNORMAL HIGH (ref 8–23)
CO2: 20 mmol/L — ABNORMAL LOW (ref 22–32)
Calcium: 8.7 mg/dL — ABNORMAL LOW (ref 8.9–10.3)
Chloride: 104 mmol/L (ref 98–111)
Creatinine, Ser: 1.97 mg/dL — ABNORMAL HIGH (ref 0.61–1.24)
GFR calc Af Amer: 38 mL/min — ABNORMAL LOW (ref >60)
GFR calc non Af Amer: 33 mL/min — ABNORMAL LOW (ref >60)
Glucose, Bld: 97 mg/dL (ref 70–99)
Phosphorus: 4.4 mg/dL (ref 2.5–4.6)
Potassium: 3.5 mmol/L (ref 3.5–5.1)
Sodium: 133 mmol/L — ABNORMAL LOW (ref 135–145)

## 2020-04-30 LAB — CBC WITH DIFFERENTIAL/PLATELET
Abs Immature Granulocytes: 0.01 10*3/uL (ref 0.00–0.07)
Basophils Absolute: 0 10*3/uL (ref 0.0–0.1)
Basophils Relative: 0 %
Eosinophils Absolute: 0 10*3/uL (ref 0.0–0.5)
Eosinophils Relative: 0 %
HCT: 24.1 % — ABNORMAL LOW (ref 39.0–52.0)
Hemoglobin: 7.7 g/dL — ABNORMAL LOW (ref 13.0–17.0)
Immature Granulocytes: 2 %
Lymphocytes Relative: 61 %
Lymphs Abs: 0.4 10*3/uL — ABNORMAL LOW (ref 0.7–4.0)
MCH: 27.3 pg (ref 26.0–34.0)
MCHC: 32 g/dL (ref 30.0–36.0)
MCV: 85.5 fL (ref 80.0–100.0)
Monocytes Absolute: 0.1 10*3/uL (ref 0.1–1.0)
Monocytes Relative: 18 %
Neutro Abs: 0.1 10*3/uL — ABNORMAL LOW (ref 1.7–7.7)
Neutrophils Relative %: 19 %
Platelets: 254 10*3/uL (ref 150–400)
RBC: 2.82 MIL/uL — ABNORMAL LOW (ref 4.22–5.81)
RDW: 20 % — ABNORMAL HIGH (ref 11.5–15.5)
WBC: 0.7 10*3/uL — CL (ref 4.0–10.5)
nRBC: 5.9 % — ABNORMAL HIGH (ref 0.0–0.2)

## 2020-04-30 LAB — CULTURE, BLOOD (ROUTINE X 2)
Culture: NO GROWTH
Culture: NO GROWTH
Special Requests: ADEQUATE
Special Requests: ADEQUATE

## 2020-04-30 LAB — URIC ACID: Uric Acid, Serum: 6.4 mg/dL (ref 3.7–8.6)

## 2020-04-30 LAB — MAGNESIUM: Magnesium: 1.9 mg/dL (ref 1.7–2.4)

## 2020-04-30 LAB — PREPARE RBC (CROSSMATCH)

## 2020-04-30 MED ORDER — TORSEMIDE 20 MG PO TABS: 20.0000 mg | ORAL_TABLET | Freq: Every day | ORAL | 0 refills | Status: DC

## 2020-04-30 MED ORDER — ACETAMINOPHEN 325 MG PO TABS
650.0000 mg | ORAL_TABLET | Freq: Once | ORAL | Status: AC
  Administered 2020-04-30: 650 mg via ORAL
  Filled 2020-04-30: qty 2

## 2020-04-30 MED ORDER — FUROSEMIDE 10 MG/ML IJ SOLN
40.0000 mg | Freq: Once | INTRAMUSCULAR | Status: AC
  Administered 2020-04-30: 40 mg via INTRAVENOUS
  Filled 2020-04-30: qty 4

## 2020-04-30 MED ORDER — HEPARIN SOD (PORK) LOCK FLUSH 100 UNIT/ML IV SOLN
500.0000 [IU] | Freq: Every day | INTRAVENOUS | Status: DC | PRN
  Filled 2020-04-30: qty 5

## 2020-04-30 MED ORDER — HEPARIN SOD (PORK) LOCK FLUSH 100 UNIT/ML IV SOLN
250.0000 [IU] | INTRAVENOUS | Status: DC | PRN
  Filled 2020-04-30: qty 5

## 2020-04-30 MED ORDER — DIPHENHYDRAMINE HCL 25 MG PO CAPS
25.0000 mg | ORAL_CAPSULE | Freq: Once | ORAL | Status: AC
  Administered 2020-04-30: 25 mg via ORAL
  Filled 2020-04-30: qty 1

## 2020-04-30 MED ORDER — SODIUM CHLORIDE 0.9% IV SOLUTION
250.0000 mL | Freq: Once | INTRAVENOUS | Status: AC
  Administered 2020-04-30: 250 mL via INTRAVENOUS

## 2020-04-30 MED ORDER — TORSEMIDE 20 MG PO TABS: 20.0000 mg | ORAL_TABLET | Freq: Every day | ORAL | Status: DC

## 2020-04-30 MED ORDER — SODIUM CHLORIDE 0.9% FLUSH: 10.0000 mL | INTRAVENOUS | Status: DC | PRN

## 2020-04-30 MED ORDER — SODIUM CHLORIDE 0.9% FLUSH: 3.0000 mL | INTRAVENOUS | Status: DC | PRN

## 2020-04-30 MED ORDER — MIDODRINE HCL 5 MG PO TABS: 5.0000 mg | ORAL_TABLET | Freq: Three times a day (TID) | ORAL | 0 refills | Status: DC

## 2020-04-30 NOTE — Unmapped (Signed)
Hi Courtney    Please let me know where I should reschedule this pt to.    Thanks  Almira Coaster

## 2020-04-30 NOTE — Plan of Care (Signed)
  Problem: Fluid Volume: Goal: Hemodynamic stability will improve Outcome: Progressing   Problem: Clinical Measurements: Goal: Diagnostic test results will improve Outcome: Progressing

## 2020-04-30 NOTE — Progress Notes (Signed)
Printed discharge instructions reviewed with patient. Opportunity for questions made available. Patient verbalized understanding of discharge instructions. IV & telemetry removed per protocol. Family to transport pt home.

## 2020-04-30 NOTE — Progress Notes (Signed)
Lab called to notify that morning WBC resulted 0.7. MD notified.

## 2020-04-30 NOTE — Progress Notes (Signed)
Updated patient's daughter prior to her leaving hospital last. She verblaized that pt is to be discharged tomorrow, per the oncologist. This RN stated that plan written in chart per hospitalist was for DC in 2 days. Daughter became upset stating that if DC date is changed it will be "devastating emotionally" for the family. This RN stated that the decision would be made the next day considering the patient's condition, as well as input from family and other team members. She was visibly upset and asked that this information not be relayed to pt's wife.   Later in evening, pt's wife called to the unit and was very worried about her husband as she has been calling into his room and there was no answer. Nurse tech checked on pt, and he was in the bathroom and this information was relayed to pt's wife.

## 2020-04-30 NOTE — Discharge Instructions (Signed)
Community-Acquired Pneumonia, Adult Pneumonia is an infection of the lungs. It causes swelling in the airways of the lungs. Mucus and fluid may also build up inside the airways. One type of pneumonia can happen while a person is in a hospital. A different type can happen when a person is not in a hospital (community-acquired pneumonia).  What are the causes?  This condition is caused by germs (viruses, bacteria, or fungi). Some types of germs can be passed from one person to another. This can happen when you breathe in droplets from the cough or sneeze of an infected person. What increases the risk? You are more likely to develop this condition if you:  Have a long-term (chronic) disease, such as: ? Chronic obstructive pulmonary disease (COPD). ? Asthma. ? Cystic fibrosis. ? Congestive heart failure. ? Diabetes. ? Kidney disease.  Have HIV.  Have sickle cell disease.  Have had your spleen removed.  Do not take good care of your teeth and mouth (poor dental hygiene).  Have a medical condition that increases the risk of breathing in droplets from your own mouth and nose.  Have a weakened body defense system (immune system).  Are a smoker.  Travel to areas where the germs that cause this illness are common.  Are around certain animals or the places they live. What are the signs or symptoms?  A dry cough.  A wet (productive) cough.  Fever.  Sweating.  Chest pain. This often happens when breathing deeply or coughing.  Fast breathing or trouble breathing.  Shortness of breath.  Shaking chills.  Feeling tired (fatigue).  Muscle aches. How is this treated? Treatment for this condition depends on many things. Most adults can be treated at home. In some cases, treatment must happen in a hospital. Treatment may include:  Medicines given by mouth or through an IV tube.  Being given extra oxygen.  Respiratory therapy. In rare cases, treatment for very bad pneumonia  may include:  Using a machine to help you breathe.  Having a procedure to remove fluid from around your lungs. Follow these instructions at home: Medicines  Take over-the-counter and prescription medicines only as told by your doctor. ? Only take cough medicine if you are losing sleep.  If you were prescribed an antibiotic medicine, take it as told by your doctor. Do not stop taking the antibiotic even if you start to feel better. General instructions   Sleep with your head and neck raised (elevated). You can do this by sleeping in a recliner or by putting a few pillows under your head.  Rest as needed. Get at least 8 hours of sleep each night.  Drink enough water to keep your pee (urine) pale yellow.  Eat a healthy diet that includes plenty of vegetables, fruits, whole grains, low-fat dairy products, and lean protein.  Do not use any products that contain nicotine or tobacco. These include cigarettes, e-cigarettes, and chewing tobacco. If you need help quitting, ask your doctor.  Keep all follow-up visits as told by your doctor. This is important. How is this prevented? A shot (vaccine) can help prevent pneumonia. Shots are often suggested for:  People older than 73 years of age.  People older than 73 years of age who: ? Are having cancer treatment. ? Have long-term (chronic) lung disease. ? Have problems with their body's defense system. You may also prevent pneumonia if you take these actions:  Get the flu (influenza) shot every year.  Go to the dentist as   often as told.  Wash your hands often. If you cannot use soap and water, use hand sanitizer. Contact a doctor if:  You have a fever.  You lose sleep because your cough medicine does not help. Get help right away if:  You are short of breath and it gets worse.  You have more chest pain.  Your sickness gets worse. This is very serious if: ? You are an older adult. ? Your body's defense system is weak.  You  cough up blood. Summary  Pneumonia is an infection of the lungs.  Most adults can be treated at home. Some will need treatment in a hospital.  Drink enough water to keep your pee pale yellow.  Get at least 8 hours of sleep each night. This information is not intended to replace advice given to you by your health care provider. Make sure you discuss any questions you have with your health care provider. Document Revised: 12/29/2018 Document Reviewed: 05/06/2018 Elsevier Patient Education  2020 [employer] Inc[employer]  

## 2020-04-30 NOTE — Care Management Important Message (Signed)
Important Message  Patient Details  Name: Ruben [name] MRN: [id number] Date of Birth: September 03, 1946   Medicare Important Message Given:  Yes  Reviewed with patient via room phone due to isolation status.  Declined receiving copy as one mailed to home address already.     Dannette Barbara 04/30/2020, 12:00 PM

## 2020-04-30 NOTE — Progress Notes (Signed)
[facility]  ROUNDING NOTE   Subjective:   UOP 1200 status post IV furosemide.   Patient is upset about discharge planning.    Objective:  Vital signs in last 24 hours:  Temp:  [97.6 F (36.4 C)-98.4 F (36.9 C)] 97.9 F (36.6 C) (08/09 1220) Pulse Rate:  [79-87] 80 (08/09 1220) Resp:  [17-19] 18 (08/09 1220) BP: (106-120)/(62-75) 106/70 (08/09 1220) SpO2:  [99 %-100 %] 100 % (08/09 1220) Weight:  [78.4 kg] 78.4 kg (08/09 0530)  Weight change: 1.027 kg Filed Weights   04/28/20 0347 04/29/20 0459 04/30/20 0530  Weight: 77.1 kg 77.4 kg 78.4 kg    Intake/Output: I/O last 3 completed shifts: In: 840 [P.O.:665; Other:175] Out: 1375 [PFXTK:2409]   Intake/Output this shift:  Total I/O In: 320 [I.V.:10; Blood:310] Out: 700 [Urine:700]  Physical Exam: General: NAD, laying in bed  Head: Normocephalic, atraumatic. Moist oral mucosal membranes  Eyes: Anicteric, PERRL  Neck: Supple, trachea midline  Lungs:  Clear to auscultation  Heart: Regular rate and rhythm  Abdomen:  Soft, nontender,   Extremities:  + peripheral edema.  Neurologic: Nonfocal, moving all four extremities  Skin: No lesions        Basic Metabolic Panel: Recent Labs  Lab 04/26/20 0508 04/26/20 0508 04/26/20 2202 04/26/20 2202 04/27/20 0054 04/27/20 0054 04/28/20 0414 04/29/20 0732 04/30/20 0644  NA 131*   < > 130*  --  131*  --  129* 130* 133*  K 3.4*   < > 4.0  --  3.9  --  4.0 3.5 3.5  CL 101   < > 101  --  101  --  100 101 104  CO2 22   < > 17*  --  19*  --  18* 18* 20*  GLUCOSE 93   < > 189*  --  117*  --  96 109* 97  BUN 31*   < > 37*  --  37*  --  47* 53* 52*  CREATININE 1.60*   < > 1.80*  --  1.88*  --  2.06* 2.11* 1.97*  CALCIUM 8.2*   < > 7.9*   < > 8.0*   < > 8.2* 8.6* 8.7*  MG 1.6*  --   --   --  1.7  --  1.6* 2.0 1.9  PHOS 4.2  --   --   --  4.7*  --   --  4.5 4.4   < > = values in this interval not displayed.    Liver Function Tests: Recent Labs  Lab  04/25/20 1909 04/28/20 0414 04/30/20 0644  AST 52* 45*  --   ALT 13 10  --   ALKPHOS 89 70  --   BILITOT 0.7 1.0  --   PROT 8.8* 7.4  --   ALBUMIN 3.0* 2.3* 2.2*   No results for input(s): LIPASE, AMYLASE in the last 168 hours. No results for input(s): AMMONIA in the last 168 hours.  CBC: Recent Labs  Lab 04/26/20 2202 04/27/20 0054 04/28/20 0414 04/29/20 0732 04/30/20 0644  WBC 2.6* 1.3* 1.2* 0.9* 0.7*  NEUTROABS 0.8* 0.4* 0.3* 0.1* 0.1*  HGB 9.1* 8.7* 7.8* 7.6* 7.7*  HCT 28.0* 26.4* 24.2* 23.1* 24.1*  MCV 87.5 85.4 87.7 83.7 85.5  PLT 526* 412* 382 322 254    Cardiac Enzymes: No results for input(s): CKTOTAL, CKMB, CKMBINDEX, TROPONINI in the last 168 hours.  BNP: Invalid input(s): POCBNP  CBG: Recent Labs  Lab 04/26/20 2140  GLUCAP 179*    Microbiology: Results for orders placed or performed during the hospital encounter of 04/25/20  Culture, blood (Routine x 2)     Status: None   Collection Time: 04/25/20  7:09 PM   Specimen: BLOOD  Result Value Ref Range Status   Specimen Description BLOOD BLOOD LEFT FOREARM  Final   Special Requests   Final    BOTTLES DRAWN AEROBIC AND ANAEROBIC Blood Culture adequate volume   Culture   Final    NO GROWTH 5 DAYS Performed at [facility], [address], [place], [place] [address]    Report Status 04/30/2020 FINAL  Final  Culture, blood (Routine x 2)     Status: None   Collection Time: 04/25/20  7:12 PM   Specimen: BLOOD  Result Value Ref Range Status   Specimen Description BLOOD BLOOD RIGHT FOREARM  Final   Special Requests   Final    BOTTLES DRAWN AEROBIC AND ANAEROBIC Blood Culture adequate volume   Culture   Final    NO GROWTH 5 DAYS Performed at [facility], [address], [place], [place] [address]    Report Status 04/30/2020 FINAL  Final  Urine culture     Status: None   Collection Time: 04/25/20  7:41 PM   Specimen: In/Out Cath Urine  Result Value Ref Range Status   Specimen  Description   Final    IN/OUT CATH URINE Performed at [facility], [address] [place], [place] [address]    Special Requests   Final    NONE Performed at [facility], [address] [place], [place] [address]    Culture   Final    NO GROWTH Performed at [facility], [place] [address], [place], [place] [address]    Report Status 04/26/2020 FINAL  Final  SARS Coronavirus 2 by RT PCR (hospital order, performed in [facility] hospital lab) Nasopharyngeal Nasopharyngeal Swab     Status: None   Collection Time: 04/25/20  8:12 PM   Specimen: Nasopharyngeal Swab  Result Value Ref Range Status   SARS Coronavirus 2 NEGATIVE NEGATIVE Final    Comment: (NOTE) SARS-CoV-2 target nucleic acids are NOT DETECTED.  The SARS-CoV-2 RNA is generally detectable in upper and lower respiratory specimens during the acute phase of infection. The lowest concentration of SARS-CoV-2 viral copies this assay can detect is 250 copies / mL. A negative result does not preclude SARS-CoV-2 infection and should not be used as the sole basis for treatment or other patient management decisions.  A negative result may occur with improper specimen collection / handling, submission of specimen other than nasopharyngeal swab, presence of viral mutation(s) within the areas targeted by this assay, and inadequate number of viral copies (<250 copies / mL). A negative result must be combined with clinical observations, patient history, and epidemiological information.  Fact Sheet for Patients:   StrictlyIdeas.no  Fact Sheet for Healthcare Providers: BankingDealers.co.za  This test is not yet approved or  cleared by the [place] FDA and has been authorized for detection and/or diagnosis of SARS-CoV-2 by FDA under an Emergency Use Authorization (EUA).  This EUA will remain in effect (meaning this test can be used) for the duration of  the COVID-19 declaration under Section 564(b)(1) of the Act, 21 U.S.C. section 360bbb-3(b)(1), unless the authorization is terminated or revoked sooner.  Performed at [facility], [address] [place], [place] [address]   MRSA PCR Screening  Status: None   Collection Time: 04/26/20  9:42 PM   Specimen: Nasopharyngeal  Result Value Ref Range Status   MRSA by PCR NEGATIVE NEGATIVE Final    Comment:        The GeneXpert MRSA Assay (FDA approved for NASAL specimens only), is one component of a comprehensive MRSA colonization surveillance program. It is not intended to diagnose MRSA infection nor to guide or monitor treatment for MRSA infections. Performed at [facility], [address] [place], [place] [address]   Respiratory Panel by PCR     Status: None   Collection Time: 04/28/20  3:12 PM   Specimen: Nasopharyngeal Swab; Respiratory  Result Value Ref Range Status   Adenovirus NOT DETECTED NOT DETECTED Final   Coronavirus 229E NOT DETECTED NOT DETECTED Final    Comment: (NOTE) The Coronavirus on the Respiratory Panel, DOES NOT test for the novel  Coronavirus (2019 nCoV)    Coronavirus HKU1 NOT DETECTED NOT DETECTED Final   Coronavirus NL63 NOT DETECTED NOT DETECTED Final   Coronavirus OC43 NOT DETECTED NOT DETECTED Final   Metapneumovirus NOT DETECTED NOT DETECTED Final   Rhinovirus / Enterovirus NOT DETECTED NOT DETECTED Final   Influenza A NOT DETECTED NOT DETECTED Final   Influenza B NOT DETECTED NOT DETECTED Final   Parainfluenza Virus 1 NOT DETECTED NOT DETECTED Final   Parainfluenza Virus 2 NOT DETECTED NOT DETECTED Final   Parainfluenza Virus 3 NOT DETECTED NOT DETECTED Final   Parainfluenza Virus 4 NOT DETECTED NOT DETECTED Final   Respiratory Syncytial Virus NOT DETECTED NOT DETECTED Final   Bordetella pertussis NOT DETECTED NOT DETECTED Final   Chlamydophila pneumoniae NOT DETECTED NOT DETECTED Final   Mycoplasma pneumoniae NOT  DETECTED NOT DETECTED Final    Comment: Performed at [facility], [place]. [address], [place], [place] [address]    Coagulation Studies: No results for input(s): LABPROT, INR in the last 72 hours.  Urinalysis: No results for input(s): COLORURINE, LABSPEC, PHURINE, GLUCOSEU, HGBUR, BILIRUBINUR, KETONESUR, PROTEINUR, UROBILINOGEN, NITRITE, LEUKOCYTESUR in the last 72 hours.  Invalid input(s): APPERANCEUR    Imaging: US RENAL  Result Date: 04/29/2020 CLINICAL DATA:  Acute on chronic kidney disease.  Inpatient. EXAM: RENAL / URINARY TRACT ULTRASOUND COMPLETE COMPARISON:  08/04/2016 renal sonogram. FINDINGS: Right Kidney: Renal measurements: 10.2 x 5.1 x 5.6 cm = volume: 152 mL. Echogenic right renal parenchyma, normal thickness. Mild right hydronephrosis. No renal mass. Left Kidney: Renal measurements: 12.6 x 5.3 x 5.4 cm = volume: 188 mL. Echogenic left renal parenchyma, normal thickness. No left hydronephrosis. Tiny simple parapelvic left renal cyst. Bladder: Appears normal for degree of bladder distention. Other: Diffuse bladder wall thickening and trabeculation. Bilateral ureteral jets demonstrated in the bladder. Splenomegaly. Bilateral pleural effusions. Trace ascites. IMPRESSION: 1. Echogenic kidneys, compatible with nonspecific renal parenchymal disease. 2. Mild right hydronephrosis. No left hydronephrosis. Bilateral ureteral jets visualized in the bladder. 3. Diffuse bladder wall thickening and trabeculation, nonspecific. Suggest correlation with urinalysis. 4. Incidental bilateral pleural effusions, trace ascites and splenomegaly. Electronically Signed   By: Ilona Sorrel M.D.   On: 04/29/2020 16:44     Medications:   . sodium chloride 250 mL (04/29/20 2306)  . sodium chloride Stopped (04/27/20 0127)  . ceFEPime (MAXIPIME) IV 2 g (04/30/20 0931)   . allopurinol  150 mg Oral Daily  . feeding supplement  1 Container Oral TID BM  . finasteride  5 mg Oral Q supper  . folic acid  1 mg  Oral Daily  . heparin  5,000 Units Subcutaneous Q8H  . latanoprost  1 drop Both Eyes QHS  . midodrine  5 mg Oral TID WC  . posaconazole  300 mg Oral Daily  . sodium bicarbonate  650 mg Oral BID  . sodium chloride flush  3 mL Intravenous Q12H  . timolol  1 drop Both Eyes Daily  . [START ON 05/01/2020] torsemide  20 mg Oral Daily  . valACYclovir  500 mg Oral Daily  . vitamin B-12  1,000 mcg Oral Daily   sodium chloride, acetaminophen **OR** acetaminophen, heparin lock flush, heparin lock flush, ondansetron **OR** ondansetron (ZOFRAN) IV, sodium chloride flush, sodium chloride flush  Assessment/ Plan:  Mr. GALILEO COLELLO is a 74 y.o. white male with AML, crohn's disease, gout, glaucoma, BPH, who is admitted to [facility] on 04/25/2020 for Atypical pneumonia [J18.9] Neutropenic fever ([place]) [D70.9, R50.81] Sepsis ([place]) [A41.9] Sepsis due to pneumonia ([place]) [J18.9, A41.9] Sepsis without acute organ dysfunction, due to unspecified organism ([place]) [A41.9]  1. Acute kidney injury with hyponatremia and metabolic acidosis on chronic kidney disease stage IIIB: Baseline creatinine of 1.96, GFR of 33 on 01/27/2020.  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  Acute kidney injury due to concurrent illness of sepsis, pneumonia.  Takes PO sodium bicarbonate at home. Not on an ACE-I/ARB on home regimen.   - Check renal ultrasound - IV furosemide 26m again today. Discharge on PO diuretics: torsemide 129mdaily.  - Continue sodium bicarbonate.   2. Anemia with kidney disease, AML, polycythemia, leukopenia.  Status post PRBC transfusion on 8/5. - Appreciate heme/onc input.    LOS: 5 Jalise Zawistowski 8/9/202112:53 PM

## 2020-04-30 NOTE — Discharge Summary (Signed)
Physician Discharge Summary  Ruben [name] CHE:[id number] DOB: 1946/02/14 DOA: 04/25/2020  PCP: Ruben Sons, [name]  Admit date: 04/25/2020 Discharge date: 04/30/2020  Discharge disposition: Home   Recommendations for Outpatient Follow-Up:   Follow-up with Dr. Mike [name], oncologist in 1 week   Discharge Diagnosis:   Principal Problem:   Sepsis due to pneumonia [facility]) Active Problems:   Crohn's disease of small intestine without complication (HCC)   Hyponatremia   CKD (chronic kidney disease) stage 3, GFR 30-59 ml/min   Normocytic anemia   Acute myeloid leukemia not having achieved remission (HCC)   Febrile neutropenia (HCC)   Leukopenia due to antineoplastic chemotherapy [facility])    Discharge Condition: Stable.  Diet recommendation:  Diet Order            Diet - low sodium heart healthy           Diet regular Room service appropriate? Yes; Fluid consistency: Thin  Diet effective now                   Code Status: Partial Code     Hospital Course:   Mr. Ruben [name] is a 74 y.o. 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  He also complained of increased lethargy, dry cough, loose stool.  He had been taking prophylactic antimicrobial therapy (Levaquin, Valtrex and with posaconazole).  He was febrile and tachycardic in the emergency room.  Chest x-ray showed bilateral infiltrates.  He was admitted to the hospital for neutropenic fever and sepsis possibly due to bilateral pneumonia.  He was treated with empiric IV antibiotics and IV fluids.  He transfused with a total of 2 units of packed red blood cells for severe anemia.  Hospital course was complicated by acute hypoxemic respiratory failure and acute pulmonary edema requiring BiPAP therapy.   Blood culture did not show any growth.  He had hypokalemia, hypomagnesemia and hyponatremia that have all improved with treatment.  His condition has improved and he is deemed stable for discharge to home today.  Discharge plan was discussed with oncologist, Dr. Mike [name], [place] specialist Dr. Steva [name] and nephrologist, Dr. Juleen [name], who were all involved in his care.    Medical Consultants:    Dr. Susy [name], oncologist  Dr. Steva [name], infectious disease  Dr. Juleen [name], nephrologist   Discharge Exam:    Vitals:   04/30/20 1016 04/30/20 1035 04/30/20 1200 04/30/20 1220  BP: 112/64 111/67 111/69 106/70  Pulse: 81 79 82 80  Resp: 18 18 18 18   Temp: 98.3 F (36.8 C) 98.3 F (36.8 C) 97.7 F (36.5 C) 97.9 F (36.6 C)  TempSrc: Oral Oral  Oral  SpO2: 100% 100% 99% 100%  Weight:      Height:         GEN: NAD SKIN: No rash EYES: EOMI ENT: MMM CV: RRR PULM: CTA B ABD: soft, ND, NT, +BS CNS: AAO x 3, non focal EXT: B/l leg edema, no tenderness   The results of significant diagnostics from this hospitalization (including imaging, microbiology, ancillary and laboratory) are listed below for reference.     Procedures and Diagnostic Studies:   DG Chest 1 View  Result Date: 04/26/2020 CLINICAL DATA:  Shortness of breath EXAM: CHEST  1 VIEW COMPARISON:  04/25/2020, CT 03/19/2020 FINDINGS: Stable cardiomediastinal silhouette. Interim development  of vascular congestion and moderate diffuse bilateral interstitial opacities suspicious for pulmonary edema. More focal areas of airspace disease within the peripheral right greater than left lung suspicious for pneumonia. Possible tiny pleural effusions. No pneumothorax. IMPRESSION: Interim development of vascular congestion and moderate diffuse bilateral interstitial opacities suspicious for pulmonary edema. More focal areas of airspace disease within the peripheral right greater than left lung suspicious for pneumonia. Electronically Signed    By: Ruben [name] M.D.   On: 04/26/2020 21:33   ECHOCARDIOGRAM COMPLETE  Result Date: 04/26/2020    ECHOCARDIOGRAM REPORT   Patient Name:   Ruben [name] [facility] Date of Exam: 04/26/2020 Medical Rec #:  [id number]       Height:       72.0 in Accession #:    [id number]      Weight:       164.9 lb Date of Birth:  [birth year]       BSA:          1.963 m Patient Age:    39 years        BP:           114/63 mmHg Patient Gender: M               HR:           91 bpm. Exam Location:  [facility] Procedure: 2D Echo, Color Doppler and Cardiac Doppler Indications:     R01.1 Murmur  History:         Patient has no prior history of Echocardiogram examinations.                  CKD; Arrythmias:RBBB.  Sonographer:     Charmayne Sheer RDCS (AE) Referring Phys:  [id number] Ruben [name] Diagnosing Phys: Ruben [name] IMPRESSIONS  1. Left ventricular ejection fraction, by estimation, is 55 to 60%. The left ventricle has normal function. The left ventricle has no regional wall motion abnormalities. Left ventricular diastolic parameters were normal.  2. Right ventricular systolic function is normal. The right ventricular size is normal. There is mildly elevated pulmonary artery systolic pressure.  3. Left atrial size was mild to moderately dilated.  4. The mitral valve is normal in structure. Mild to moderate mitral valve regurgitation. No evidence of mitral stenosis.  5. The aortic valve is abnormal. Aortic valve regurgitation is mild. Mild aortic valve stenosis. Aortic valve area, by VTI measures 1.82 cm. Aortic valve mean gradient measures 10.0 mmHg.  6. The inferior vena cava is normal in size with <50% respiratory variability, suggesting right atrial pressure of 8 mmHg. FINDINGS  Left Ventricle: Left ventricular ejection fraction, by estimation, is 55 to 60%. The left ventricle has normal function. The left ventricle has no regional wall motion abnormalities. The left ventricular internal cavity size was normal in size. There is  no left  ventricular hypertrophy. Left ventricular diastolic parameters were normal. Right Ventricle: The right ventricular size is normal. No increase in right ventricular wall thickness. Right ventricular systolic function is normal. There is mildly elevated pulmonary artery systolic pressure. The tricuspid regurgitant velocity is 2.97  m/s, and with an assumed right atrial pressure of 8 mmHg, the estimated right ventricular systolic pressure is 06.2 mmHg. Left Atrium: Left atrial size was mild to moderately dilated. Right Atrium: Right atrial size was normal in size. Pericardium: There is no evidence of pericardial effusion. Mitral Valve: The mitral valve is normal in structure. Normal mobility of the mitral valve leaflets. Mild to  moderate mitral valve regurgitation. No evidence of mitral valve stenosis. MV peak gradient, 4.8 mmHg. The mean mitral valve gradient is 2.0 mmHg. Tricuspid Valve: The tricuspid valve is normal in structure. Tricuspid valve regurgitation is mild . No evidence of tricuspid stenosis. Aortic Valve: The aortic valve is abnormal. Aortic valve regurgitation is mild. Aortic regurgitation PHT measures 550 msec. Mild aortic stenosis is present. Aortic valve mean gradient measures 10.0 mmHg. Aortic valve peak gradient measures 17.5 mmHg. Aortic valve area, by VTI measures 1.82 cm. Pulmonic Valve: The pulmonic valve was normal in structure. Pulmonic valve regurgitation is trivial. No evidence of pulmonic stenosis. Aorta: The aortic root is normal in size and structure. Venous: The inferior vena cava is normal in size with less than 50% respiratory variability, suggesting right atrial pressure of 8 mmHg. IAS/Shunts: No atrial level shunt detected by color flow Doppler.  LEFT VENTRICLE PLAX 2D LVIDd:         4.70 cm  Diastology LVIDs:         3.13 cm  LV e' lateral:   8.16 cm/s LV PW:         0.94 cm  LV E/e' lateral: 12.0 LV IVS:        0.89 cm  LV e' medial:    6.85 cm/s LVOT diam:     2.00 cm  LV E/e'  medial:  14.3 LV SV:         70 LV SV Index:   36 LVOT Area:     3.14 cm  RIGHT VENTRICLE RV Basal diam:  3.68 cm LEFT ATRIUM             Index       RIGHT ATRIUM           Index LA diam:        4.30 cm 2.19 cm/m  RA Area:     16.80 cm LA Vol (A2C):   66.6 ml 33.93 ml/m RA Volume:   41.10 ml  20.94 ml/m LA Vol (A4C):   85.0 ml 43.31 ml/m LA Biplane Vol: 78.9 ml 40.20 ml/m  AORTIC VALVE                    PULMONIC VALVE AV Area (Vmax):    1.68 cm     PV Vmax:       1.56 m/s AV Area (Vmean):   1.71 cm     PV Vmean:      102.000 cm/s AV Area (VTI):     1.82 cm     PV VTI:        0.267 m AV Vmax:           209.33 cm/s  PV Peak grad:  9.7 mmHg AV Vmean:          148.000 cm/s PV Mean grad:  5.0 mmHg AV VTI:            0.386 m AV Peak Grad:      17.5 mmHg AV Mean Grad:      10.0 mmHg LVOT Vmax:         112.00 cm/s LVOT Vmean:        80.700 cm/s LVOT VTI:          0.223 m LVOT/AV VTI ratio: 0.58 AI PHT:            550 msec  AORTA Ao Root diam: 3.60 cm MITRAL VALVE  TRICUSPID VALVE MV Area (PHT): 5.54 cm    TR Peak grad:   35.3 mmHg MV Peak grad:  4.8 mmHg    TR Vmax:        297.00 cm/s MV Mean grad:  2.0 mmHg MV Vmax:       1.10 m/s    SHUNTS MV Vmean:      70.4 cm/s   Systemic VTI:  0.22 m MV Decel Time: 137 msec    Systemic Diam: 2.00 cm MV E velocity: 97.70 cm/s MV A velocity: 59.10 cm/s MV E/A ratio:  1.65 Ruben [name] Electronically signed by Ruben [name] Signature Date/Time: 04/26/2020/12:15:51 PM    Final      Labs:   Basic Metabolic Panel: Recent Labs  Lab 04/26/20 0508 04/26/20 3903 04/26/20 2202 04/26/20 2202 04/27/20 0092 04/27/20 0054 04/28/20 0414 04/28/20 0414 04/29/20 0732 04/30/20 0644  NA 131*   < > 130*  --  131*  --  129*  --  130* 133*  K 3.4*   < > 4.0   < > 3.9   < > 4.0   < > 3.5 3.5  CL 101   < > 101  --  101  --  100  --  101 104  CO2 22   < > 17*  --  19*  --  18*  --  18* 20*  GLUCOSE 93   < > 189*  --  117*  --  96  --  109* 97  BUN 31*   <  > 37*  --  37*  --  47*  --  53* 52*  CREATININE 1.60*   < > 1.80*  --  1.88*  --  2.06*  --  2.11* 1.97*  CALCIUM 8.2*   < > 7.9*  --  8.0*  --  8.2*  --  8.6* 8.7*  MG 1.6*  --   --   --  1.7  --  1.6*  --  2.0 1.9  PHOS 4.2  --   --   --  4.7*  --   --   --  4.5 4.4   < > = values in this interval not displayed.   GFR Estimated Creatinine Clearance: 36.7 mL/min (A) (by C-G formula based on SCr of 1.97 mg/dL (H)). Liver Function Tests: Recent Labs  Lab 04/25/20 1909 04/28/20 0414 04/30/20 0644  AST 52* 45*  --   ALT 13 10  --   ALKPHOS 89 70  --   BILITOT 0.7 1.0  --   PROT 8.8* 7.4  --   ALBUMIN 3.0* 2.3* 2.2*   No results for input(s): LIPASE, AMYLASE in the last 168 hours. No results for input(s): AMMONIA in the last 168 hours. Coagulation profile Recent Labs  Lab 04/25/20 1909 04/26/20 0508  INR 1.3* 1.5*    CBC: Recent Labs  Lab 04/26/20 2202 04/27/20 0054 04/28/20 0414 04/29/20 0732 04/30/20 0644  WBC 2.6* 1.3* 1.2* 0.9* 0.7*  NEUTROABS 0.8* 0.4* 0.3* 0.1* 0.1*  HGB 9.1* 8.7* 7.8* 7.6* 7.7*  HCT 28.0* 26.4* 24.2* 23.1* 24.1*  MCV 87.5 85.4 87.7 83.7 85.5  PLT 526* 412* 382 322 254   Cardiac Enzymes: No results for input(s): CKTOTAL, CKMB, CKMBINDEX, TROPONINI in the last 168 hours. BNP: Invalid input(s): POCBNP CBG: Recent Labs  Lab 04/26/20 2140  GLUCAP 179*   D-Dimer No results for input(s): DDIMER in the last 72 hours. Hgb A1c No results for input(s):  HGBA1C in the last 72 hours. Lipid Profile No results for input(s): CHOL, HDL, LDLCALC, TRIG, CHOLHDL, LDLDIRECT in the last 72 hours. Thyroid function studies No results for input(s): TSH, T4TOTAL, T3FREE, THYROIDAB in the last 72 hours.  Invalid input(s): FREET3 Anemia work up [employer]    04/30/20 0644  RETICCTPCT 0.5   Microbiology Recent Results (from the past 240 hour(s))  Culture, blood (Routine x 2)     Status: None   Collection Time: 04/25/20  7:09 PM   Specimen: BLOOD    Result Value Ref Range Status   Specimen Description BLOOD BLOOD LEFT FOREARM  Final   Special Requests   Final    BOTTLES DRAWN AEROBIC AND ANAEROBIC Blood Culture adequate volume   Culture   Final    NO GROWTH 5 DAYS Performed at [facility], [address] [place], [place] [address]    Report Status 04/30/2020 FINAL  Final  Culture, blood (Routine x 2)     Status: None   Collection Time: 04/25/20  7:12 PM   Specimen: BLOOD  Result Value Ref Range Status   Specimen Description BLOOD BLOOD RIGHT FOREARM  Final   Special Requests   Final    BOTTLES DRAWN AEROBIC AND ANAEROBIC Blood Culture adequate volume   Culture   Final    NO GROWTH 5 DAYS Performed at [facility], [address], [place], [place] [address]    Report Status 04/30/2020 FINAL  Final  Urine culture     Status: None   Collection Time: 04/25/20  7:41 PM   Specimen: In/Out Cath Urine  Result Value Ref Range Status   Specimen Description   Final    IN/OUT CATH URINE Performed at [facility], [address], [place], [place] [address]    Special Requests   Final    NONE Performed at [facility], [address] [place], [place] [address]    Culture   Final    NO GROWTH Performed at [facility], [place] [address] [place], [place] [address]    Report Status 04/26/2020 FINAL  Final  SARS Coronavirus 2 by RT PCR (hospital order, performed in [facility] hospital lab) Nasopharyngeal Nasopharyngeal Swab     Status: None   Collection Time: 04/25/20  8:12 PM   Specimen: Nasopharyngeal Swab  Result Value Ref Range Status   SARS Coronavirus 2 NEGATIVE NEGATIVE Final    Comment: (NOTE) SARS-CoV-2 target nucleic acids are NOT DETECTED.  The SARS-CoV-2 RNA is generally detectable in upper and lower respiratory specimens during the acute phase of infection. The lowest concentration of SARS-CoV-2 viral copies this assay can detect is 250 copies / mL. A negative result  does not preclude SARS-CoV-2 infection and should not be used as the sole basis for treatment or other patient management decisions.  A negative result may occur with improper specimen collection / handling, submission of specimen other than nasopharyngeal swab, presence of viral mutation(s) within the areas targeted by this assay, and inadequate number of viral copies (<250 copies / mL). A negative result must be combined with clinical observations, patient history, and epidemiological information.  Fact Sheet for Patients:   StrictlyIdeas.no  Fact Sheet for Healthcare Providers: BankingDealers.co.za  This test is not yet approved or  cleared by the [place] FDA and has been authorized for detection and/or diagnosis of SARS-CoV-2 by FDA under an Emergency Use Authorization (EUA).  This EUA will remain in effect (meaning this  test can be used) for the duration of the COVID-19 declaration under Section 564(b)(1) of the Act, 21 U.S.C. section 360bbb-3(b)(1), unless the authorization is terminated or revoked sooner.  Performed at [facility], [address] [place], [place] [address]   MRSA PCR Screening     Status: None   Collection Time: 04/26/20  9:42 PM   Specimen: Nasopharyngeal  Result Value Ref Range Status   MRSA by PCR NEGATIVE NEGATIVE Final    Comment:        The GeneXpert MRSA Assay (FDA approved for NASAL specimens only), is one component of a comprehensive MRSA colonization surveillance program. It is not intended to diagnose MRSA infection nor to guide or monitor treatment for MRSA infections. Performed at [facility], [address] [place], [place] [address]   Respiratory Panel by PCR     Status: None   Collection Time: 04/28/20  3:12 PM   Specimen: Nasopharyngeal Swab; Respiratory  Result Value Ref Range Status   Adenovirus NOT DETECTED NOT DETECTED Final   Coronavirus 229E NOT  DETECTED NOT DETECTED Final    Comment: (NOTE) The Coronavirus on the Respiratory Panel, DOES NOT test for the novel  Coronavirus (2019 nCoV)    Coronavirus HKU1 NOT DETECTED NOT DETECTED Final   Coronavirus NL63 NOT DETECTED NOT DETECTED Final   Coronavirus OC43 NOT DETECTED NOT DETECTED Final   Metapneumovirus NOT DETECTED NOT DETECTED Final   Rhinovirus / Enterovirus NOT DETECTED NOT DETECTED Final   Influenza A NOT DETECTED NOT DETECTED Final   Influenza B NOT DETECTED NOT DETECTED Final   Parainfluenza Virus 1 NOT DETECTED NOT DETECTED Final   Parainfluenza Virus 2 NOT DETECTED NOT DETECTED Final   Parainfluenza Virus 3 NOT DETECTED NOT DETECTED Final   Parainfluenza Virus 4 NOT DETECTED NOT DETECTED Final   Respiratory Syncytial Virus NOT DETECTED NOT DETECTED Final   Bordetella pertussis NOT DETECTED NOT DETECTED Final   Chlamydophila pneumoniae NOT DETECTED NOT DETECTED Final   Mycoplasma pneumoniae NOT DETECTED NOT DETECTED Final    Comment: Performed at [facility], [place], [place], [place] [address]     Discharge Instructions:   Discharge Instructions    Care order/instruction   Complete by: As directed    Transfuse Parameters   Diet - low sodium heart healthy   Complete by: As directed    Increase activity slowly   Complete by: As directed    Type and screen   Complete by: As directed    [place]      Allergies as of 04/30/2020   No Known Allergies     Medication List    STOP taking these medications   albuterol 108 (90 Base) MCG/ACT inhaler Commonly known as: VENTOLIN HFA   venetoclax 100 MG Tabs     TAKE these medications   acetaminophen 500 MG tablet Commonly known as: TYLENOL Take 500 mg by mouth daily as needed for moderate pain.   allopurinol 300 MG tablet Commonly known as: ZYLOPRIM TAKE 1/2 TABLET (150 MG TOTAL) BY MOUTH DAILY.   finasteride 5 MG tablet Commonly known as: PROSCAR Take 1 tablet (5  mg total) by mouth every evening.   folic acid 1 MG tablet Commonly known as: FOLVITE Take 1 mg by mouth daily at 12 noon.   latanoprost 0.005 % ophthalmic solution Commonly known as: XALATAN Place 1 drop into both eyes at bedtime.   levofloxacin 250 MG tablet Commonly known  asMack Hook Take 250 mg by mouth daily.   midodrine 5 MG tablet Commonly known as: PROAMATINE Take 1 tablet (5 mg total) by mouth 3 (three) times daily with meals.   POSACONAZOLE PO Take 300 mg by mouth daily.   RA Vitamin B-12 TR 1000 MCG Tbcr Generic drug: Cyanocobalamin Take 1,000 mcg by mouth daily at 12 noon.   sodium bicarbonate 650 MG tablet Take 650 mg by mouth 2 (two) times daily.   timolol 0.5 % ophthalmic solution Commonly known as: TIMOPTIC Place 1 drop into both eyes daily. In AM   torsemide 20 MG tablet Commonly known as: DEMADEX Take 1 tablet (20 mg total) by mouth daily. Start taking on: May 01, 2020   tranexamic acid 650 MG Tabs tablet Commonly known as: LYSTEDA Take 650 mg by mouth 2 (two) times daily.   valACYclovir 500 MG tablet Commonly known as: VALTREX Take 500 mg by mouth daily.       Follow-up Information    Ruben Sons, [name] Follow up in 7 day(s).   Specialty: Family Medicine Why: Please follow up within 7 days of discharge.  Contact information: [address] [place] [address] [phone]        Lequita Asal, [name]. Schedule an appointment as soon as possible for a visit in 1 week(s).   Specialty: Hematology and Oncology Contact information: [place] [address] [phone]                Time coordinating discharge: 37 minutes  Signed:  Jennye Boroughs  [facility] 04/30/2020, 1:53 PM

## 2020-05-01 ENCOUNTER — Telehealth: Payer: Self-pay

## 2020-05-01 LAB — TYPE AND SCREEN
ABO/RH(D): O POS
Antibody Screen: NEGATIVE
Unit division: 0

## 2020-05-01 LAB — MISC LABCORP TEST (SEND OUT): Labcorp test code: 164284

## 2020-05-01 LAB — BPAM RBC
Blood Product Expiration Date: 202108172359
ISSUE DATE / TIME: 202108091009
Unit Type and Rh: 9500

## 2020-05-01 NOTE — Unmapped (Signed)
I sent a request to infusion. Hopefully they will add him.    Almira Coaster

## 2020-05-01 NOTE — Unmapped (Signed)
I scheduled all of this and then remembered that appt request orders are needed. See below:    8/16 Lab appt request (along with individual orders)  8/16 Procedure request ([facility]x)  8/16 Infusion request (Possible Transfusion)  8/23 Lab appt request (along with individual orders)  8/23 Provider request   8/23 Infusion request (possible transfusion)    Thanks  Almira Coaster

## 2020-05-01 NOTE — Telephone Encounter (Signed)
Hospital follow ups have to be done within 14 days of discharge. He can have my 3:40 or 4:00 pm slot on Monday the 23rd. If he can't make that then he needs to see a PA

## 2020-05-01 NOTE — Telephone Encounter (Signed)
Can you please move hospital follow up to 8-23 as below. Thanks.

## 2020-05-01 NOTE — Telephone Encounter (Signed)
HFU scheduled 05/21/20 @ 10:00 AM.

## 2020-05-01 NOTE — Progress Notes (Signed)
[facility]  [address], [address] [place], [place] [address] Phone: [phone]  Fax: [phone]   Clinic Day:  05/02/2020  Referring physician: [name], MD  Chief Complaint: Ruben [name] is a 74 y.o. 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  HPI: The patient was last seen in the hospital on 04/29/2020. At that time, he was doing slightly better.  Diarrhea had resolved.  He was out of bed.  He was trying to eat more.  The patient was admitted to [facility] from 04/25/2020 - 04/30/2020 for sepsis due to pneumonia. He was treated with IV fluids and antibiotics. He received a total of 2 units of PRBCs (last 04/30/2020) for severe anemia.  Hospital course was complicated by acute hypoxemic respiratory failure and pulmonary edema requiring BiPAP therapy and ICU transfer. He was treated for hypokalemia, hypomagnesemia, and hyponatremia. His condition improved and he was discharged.  Labs on 04/30/2020 revealed hematocrit of 24.1, hemoglobin 7.7, MCV 85.5, platelets 254,000, WBC 700 (ANC 100). Sodium was 133.  BUN was 52 and creatinine 1.97 (CrCl 33 ml/min). Calcium was 8.7. Albumin 2.2. Reticulocyte count was 0.5%. Magnesium was 1.9. Uric acid was 6.4.  During the interim, he has felt better. He has not had any fevers at home.  He remains on Levaquin 250 mg a day as well as posaconazole and valacyclovir.  He has been trying to move around when he can; his wife is encouraging him to stay active. His legs are swollen. He is eating well but is having some diarrhea.  He has been checking his BP at home. Yesterday it was 120/78 and today it was 119/68.  He denies dizziness or lightheadedness.  He denies any bleeding.  The patient and his wife have not heard from Dr. Evelene Croon about a bone marrow.   Past Medical History:  Diagnosis  Date  . Blood dyscrasia   . BPH (benign prostatic hyperplasia)   . Cancer ([place])    SKIN/ POLYCYTHEMIA VERA  . Chronic kidney disease    RENAL INSUFF (40%)  . Crohn disease ([place])   . Crohn's disease ([place])   . Glaucoma   . Glaucoma   . Gout   . History of chicken pox   . Myelofibrosis ([place])   . Nosebleed   . RBBB   . Right bundle branch block     Past Surgical History:  Procedure Laterality Date  . CATARACT EXTRACTION W/PHACO Left 07/13/2018   Procedure: CATARACT EXTRACTION PHACO AND INTRAOCULAR LENS PLACEMENT (IOC);  Surgeon: [name], MD;  Location: [facility] ORS;  Service: Ophthalmology;  Laterality: Left;  [place] 00:39  CDE 5.14 Fluid pack lot # [id number]  . CATARACT EXTRACTION W/PHACO Right 08/03/2018   Procedure: CATARACT EXTRACTION PHACO AND INTRAOCULAR LENS PLACEMENT (IOC);  Surgeon: [name], MD;  Location: [facility] ORS;  Service: Ophthalmology;  Laterality: Right;  [place] 00:43.4 CDE 5.23 Fluid pack Lot # [id number]  . COLONOSCOPY WITH PROPOFOL N/A 03/27/2017   Procedure: COLONOSCOPY WITH PROPOFOL;  Surgeon: [name], MD;  Location: [facility] ENDOSCOPY;  Service: Endoscopy;  Laterality: N/A;  . MOHS SURGERY     EAR  . PROSTATE SURGERY  2008   Prostate Biopsy in [place] to elevated PSA.  per patient normal  . Skin Lesion Basal cell removed    . wart removal     from eyelid  Family History  Problem Relation Age of Onset  . Cancer Mother        Liver  . Cancer Father        Prostate  . Cancer Sister        Hodgkins lymphoma  . Cancer Paternal Aunt        Breast    Social History:  reports that he quit smoking about 16 years ago. His smoking use included cigarettes. He has a 5.00 pack-year smoking history. He has never used smokeless tobacco. He reports previous alcohol use of about 4.0 - 5.0 standard drinks of alcohol per week. He reports that he does not use drugs. The patientpreviously worked for [employer]. He no longer volunteers for [employer]. The patient's wife isJudith([phone]).He lives in [place]. The patient is accompanied by [name] today.  Allergies: No Known Allergies  Current Medications: Current Outpatient Medications  Medication Sig Dispense Refill  . acetaminophen (TYLENOL) 500 MG tablet Take 500 mg by mouth daily as needed for moderate pain.    Marland Kitchen allopurinol (ZYLOPRIM) 300 MG tablet TAKE 1/2 TABLET (150 MG TOTAL) BY MOUTH DAILY. 45 tablet 1  . Cyanocobalamin (RA VITAMIN B-12 TR) 1000 MCG TBCR Take 1,000 mcg by mouth daily at 12 noon.     . finasteride (PROSCAR) 5 MG tablet Take 1 tablet (5 mg total) by mouth every evening. 90 tablet 3  . folic acid (FOLVITE) 1 MG tablet Take 1 mg by mouth daily at 12 noon.     . latanoprost (XALATAN) 0.005 % ophthalmic solution Place 1 drop into both eyes at bedtime.     Marland Kitchen levofloxacin (LEVAQUIN) 250 MG tablet Take 250 mg by mouth daily.    . midodrine (PROAMATINE) 5 MG tablet Take 1 tablet (5 mg total) by mouth 3 (three) times daily with meals. 90 tablet 0  . POSACONAZOLE PO Take 300 mg by mouth daily.    . sodium bicarbonate 650 MG tablet Take 650 mg by mouth 2 (two) times daily.    . timolol (TIMOPTIC) 0.5 % ophthalmic solution Place 1 drop into both eyes daily. In AM  5  . torsemide (DEMADEX) 20 MG tablet Take 1 tablet (20 mg total) by mouth daily. 30 tablet 0  . tranexamic acid (LYSTEDA) 650 MG TABS tablet Take 650 mg by mouth 2 (two) times daily.    . valACYclovir (VALTREX) 500 MG tablet Take 500 mg by mouth daily.    . cyanocobalamin 1000 MCG tablet Take by mouth. (Patient not taking: Reported on 05/02/2020)     No current facility-administered medications for this visit.    Review of Systems  Constitutional: Positive for malaise/fatigue. Negative for chills, diaphoresis, fever and weight loss (up 5 lbs).       Feels better. Trying to stay active.  HENT: Negative for congestion, ear discharge, ear pain, hearing loss, nosebleeds, sinus pain, sore throat and  tinnitus.   Eyes: Negative for blurred vision.  Respiratory: Negative for cough, hemoptysis, sputum production and shortness of breath.   Cardiovascular: Positive for leg swelling. Negative for chest pain and palpitations.  Gastrointestinal: Positive for diarrhea. Negative for abdominal pain, blood in stool, constipation, heartburn, melena, nausea and vomiting.       Eating well.  Genitourinary: Negative for dysuria, frequency, hematuria and urgency.  Musculoskeletal: Negative for back pain, joint pain, myalgias and neck pain.  Skin: Negative for itching and rash.  Neurological: Positive for weakness (generalized, improved). Negative for dizziness, tingling, sensory change  and headaches.  Endo/Heme/Allergies: Does not bruise/bleed easily.  Psychiatric/Behavioral: Negative for depression and memory loss. The patient is not nervous/anxious and does not have insomnia.   All other systems reviewed and are negative.  Performance status (ECOG): 2  Vitals Blood pressure (!) 114/58, pulse 81, temperature (!) 96.6 F (35.9 C), temperature source Tympanic, resp. rate 18, height 6' (1.829 m), weight 167 lb 1.7 oz (75.8 kg), SpO2 100 %.   Physical Exam Vitals and nursing note reviewed.  Constitutional:      General: He is not in acute distress.    Appearance: He is not diaphoretic.     Comments: Chronically fatigued appearing thin gentleman sitting in the exam room in no acute distress.   HENT:     Head: Normocephalic and atraumatic.     Comments: Short gray hair.  Temporal wasting.    Mouth/Throat:     Mouth: Mucous membranes are moist.     Pharynx: Oropharynx is clear.  Eyes:     General: No scleral icterus.    Extraocular Movements: Extraocular movements intact.     Conjunctiva/sclera: Conjunctivae normal.     Pupils: Pupils are equal, round, and reactive to light.     Comments: Glasses.  Blue eyes.  Cardiovascular:     Rate and Rhythm: Normal rate and regular rhythm.     Heart sounds:  Normal heart sounds. No murmur heard.   Pulmonary:     Effort: Pulmonary effort is normal. No respiratory distress.     Breath sounds: Normal breath sounds. No wheezing or rales.     Comments: Decreased breath sounds at the bases. Chest:     Chest wall: No tenderness.  Abdominal:     General: Bowel sounds are normal. There is no distension.     Palpations: There is no mass.     Tenderness: There is no abdominal tenderness. There is no guarding or rebound.     Comments: Spleen palpable 2 finger breadths below left costal margin.  Musculoskeletal:        General: No swelling or tenderness. Normal range of motion.     Cervical back: Normal range of motion and neck supple.     Comments: 2-3+ bilateral lower extremity edema.  Lymphadenopathy:     Head:     Right side of head: No preauricular, posterior auricular or occipital adenopathy.     Left side of head: No preauricular, posterior auricular or occipital adenopathy.     Cervical: No cervical adenopathy.     Upper Body:     Right upper body: No supraclavicular or axillary adenopathy.     Left upper body: No supraclavicular or axillary adenopathy.     Lower Body: No right inguinal adenopathy. No left inguinal adenopathy.  Skin:    General: Skin is warm and dry.  Neurological:     Mental Status: He is alert and oriented to person, place, and time.  Psychiatric:        Behavior: Behavior normal.        Thought Content: Thought content normal.        Judgment: Judgment normal.    No results displayed because visit has over 200 results.      Assessment:  ZIGMUND LINSE is a 74 y.o. 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 He has had polycythemia dating back to 2013. Hematocrit was 62.1 with a hemoglobin of 19.8 on 03/28/2015. 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 Erythropoietin level  was 1.1 (low). 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  He began a phlebotomyprogram on  03/28/2015 to maintain a hematocrit goal of <45.Last phlebotomywason 05/02/2015. He is on a baby aspirin.  He was initially on hydroxyurea beginning in 11/2015 then Jakafi (05/28/2016 - **/2021).  Bone marrowon 12/14/2015 revealed a persistent myeloproliferative neoplasm with myelofibrosis and alterations compatible with myelodysplatic progression. Marrow was packed (95-100% cellularity) with pan myelosis, multi-lineage dyspoiesis, and no significant increase in blasts. There was moderate to focally marked reticulin fibrosis (grade 2-3/3). Storage iron was not identified. Flow cytometry revealed non-specific atypical myeloid findings with no increase in blasts. 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 Cytogenetics were normal (46, XY).  Bone marrowon 04/07/2016 at [facility] revealed a hypercellular marrow (>95%) with persistent involvement by myeloid neoplasm with 5% blasts. There was mild reticulin fibrosis. FISH t(9;22) results were normal. 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 Cytogenetics are pending.  Bone marrowon 08/09/2018 revealed a hypercellular bone marrow (>95%) with persistent involvement by myeloid neoplasm with <1% blasts by manual touch preparation differential. There was marked reticulin fibrosis with focal collagen deposition. Cytogenetics were normal (46, XY). - Myeloid mutation panel study is pending.  Bone marrowon 07/14/2019 revealed a hypercellular marrow (>95%) with persistent involvement by a myeloid neoplasm with extensive fibrosis and rare (< 5%) CD34 +blast byIHC. Myeloid mutation panel revealed the patient's previously reported variants inIDH2, JAK2, KRAS, NRAS and SRSF2 were again identified. There are no new variants. Cytogenetics are pending.  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  He was admitted to [facility]  from 03/28/2020 - 04/04/2020 forcycle #1azacitidine and venetoclax.  Day #1 was 04/01/2020. He tolerated treatment well, with the exception of mild tumor lysis syndrome, diarrhea, epistaxis x 1, and mild hyponatremia. He completed a 5 day course ofceftriaxone andazithromycin forpneumonia.  He is currently day 32 s/p cycle #1 azacytidine and venetoclax.  He has urinary retention. Renal ultrasoundon 07/28/2016 revealed bilateral hydronephrosis (right >left) and a large postvoid residual (2 liters) suggesting bladder dysfunction or outlet obstruction. He underwent temporary Foley catheter placement. He performs I/O self catheterizations. He is on Flomax and finasteride. PSAwas 2.02 on 07/09/2016.  He is followed by GI (Dr. Vira Agar) for a history of polypsand Crohn's disease.He had an unremarkable colonoscopy in 03/2017.AbdomenandpelvisCTon 04/03/2019 revealed mild wall thickening of portions of a loop of small bowel within the pelvis with intervening areas of mildly dilated small bowel,consistent with active Crohn's disease. No evidence of an abscess or fistula. No other evidence of bowel inflammation.There was splenomegaly(16.7 x 7.7 x 15.6 cm), developed since12/11/2011.There was mild bladder wall thickeningandenlargement of the prostate. Chronic bladder outlet obstructionwas felt to bethe etiology of bladder wall thickening.  He has chronicrenal insufficiency(Cr 1.85; CrCl 36 ml/min). SPEP on 09/23/2016 was negative. Spot urine revealed 17.4% of 27.3 mg/dL of a monoclonal protein. 24 hour urine on 10/16/2016 revealed no monoclonal protein. Renal function transiently decreased in 06/2017 after Bactrim. 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  He has a history of epistaxis. Normal studies included: PT, PTT, von Willebrand panel, and platelet function assay (PFA).  He has right foot drop. HeadMRIon  06/09/2019 showed strong evidence of diffuse osseous metastatic disease. There was no non-contrast MRI evidence of brain metastasis. There was only mild for age nonspecific cerebral white signal changes. Lumbar spine MRIon 06/09/2019 showed diffusely abnormal marrow signal in the visible skeleton, as  seen on the brain MRI. There was no pathologic fracture and no extraosseous extension of tumor on the lumbar spine or visible sacrum. In conjunction with the splenomegaly by CT in [place] infiltration byleukemia/lymphoma may be the most likely etiology. Differential considerations included multiple myeloma and metastatic disease unknown primary. The involvement of the pelvis should be amenable to bone marrow biopsy.There was superimposed mild for age lumbar spine degeneration with no spinal stenosis.There was mild degenerative L4 and L5 neural foraminal stenosis.  He has had a history of pneumoniasince 02/2020.  He was admitted to [facility] from 04/25/2020 - 04/30/2020 for sepsis due to pneumonia. He was treated with IV fluids and antibiotics. He received a total of 2 units of PRBCs (last 04/30/2020) for severe anemia. Hospital course was complicated by acute hypoxemic respiratory failure and pulmonary edema requiring BiPAP therapy and ICU transfer. He was treated for hypokalemia, hypomagnesemia, and hyponatremia.   He received the Moderna COVID-19vaccineon 10/28/2019 and 11/29/2019. COVID-19 testing was negative on 02/29/2020.  Symptomatically, he is feeling better status post discharge from the hospital.  He has a little bit of diarrhea.  He denies any fevers.  Exam reveals splenomegaly.  Plan: 1.   Labs today:  CBC with diff, BMP. 2.   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 He is day 32 of cycle #1 azacitidine + Venetoclax. Venetoclax remains on hold. Discuss plans for bone marrow aspirate and biopsy then  decision regarding direction of therapy. 3. Anemia and leukopenia             Hematocrit 23.1.  Hemoglobin 7.6.  MCV 83.7.  Platelets 322,000.  WBC 900 with an [place] of 100 on 04/29/2020.  Hematocrit 26.5.  Hemoglobin 8.5.  MCV 87.2.  Platelets 140,000.  WBC 800 with an [place] of 100 on 05/02/2020.  Etiology secondary to chemotherapy and AML.  Await bone marrow.    Maintain hemoglobin > 8.  Blood products are leukopoor and irradiated. Discuss continuation of neutropenic precautions.  Continue prophylactic antibiotics: Levaquin, posaconazole, and valacyclovir. 4.   Renal insufficiency             Creatinine 2.02.             Patient seen by nephrology while hospitalized.    Chronic renal insufficiency exacerbated by recent infection/sepsis.             Patient continues to utilize in and out catheterization. 5.   RN to call patient with results and date of marrow. 6.   Contact [facility] (Dr Evelene Croon and Norlene Duel, NP). 7.   RTC in 1 week for MD assessment and labs (CBC with diff, BMP, hold tube).  I discussed the assessment and treatment plan with the patient.  The patient was provided an opportunity to ask questions and all were answered.  The patient agreed with the plan and demonstrated an understanding of the instructions.  The patient was advised to call back if the symptoms worsen or if the condition fails to improve as anticipated.  I provided 22 minutes of face-to-face time during this this encounter and > 50% was spent counseling as documented under my assessment and plan.    Lequita Asal, MD, PhD    05/02/2020, 11:53 AM  I, Mirian Mo Tufford, am acting as Education administrator for [employer]. Mike Gip, MD, PhD.  I, Cirilo Canner C. Mike Gip, MD, have reviewed the above documentation for accuracy and completeness, and I agree with the above.

## 2020-05-01 NOTE — Telephone Encounter (Signed)
Transition Care Management Follow-up Telephone Call  Date of discharge and from where: [facility] on 04/30/20  How have you been since you were released from the hospital? Doing better, has more flexibility since leaving the hospital. Pt is still coughing/wheezing but not as bad. Pt still feels weak but plans to move around more to help regain strength. Pt has had diarrhea but states that is an ongoing issue. Declines pain, fever, SOB or n/v.  Any questions or concerns? No   Items Reviewed:  Did the pt receive and understand the discharge instructions provided? Yes   Medications obtained and verified? No, declined reviewing due to wife managing medications and she was not present at the time.  Any new allergies since your discharge? No   Dietary orders reviewed? Yes  Do you have support at home? Yes   Other (ie: DME, Home Health, etc): N/A  Functional Questionnaire: (I = Independent and D = Dependent)  Bathing/Dressing- I   Meal Prep- I  Eating- I  Maintaining continence- D, self catheterizes   Transferring/Ambulation- I  Managing Meds- D, wife manages medications.    Follow up appointments reviewed:    PCP Hospital f/u appt confirmed? Yes  scheduled to see Dr Caryn Section on 05/21/20 @ 10:00 AM.  [facility] f/u appt confirmed? Yes    Are transportation arrangements needed? No  If their condition worsens, is the pt aware to call  their PCP or go to the ED? Yes  Was the patient provided with contact information for the PCP's office or ED? Yes  Was the pt encouraged to call back with questions or concerns? Yes

## 2020-05-01 NOTE — Telephone Encounter (Signed)
Spoke with wife (ok per DPR and patient) and advised her of needing to change the apt. Wife agreed to change apt to 05/14/20 @ 3:40 PM, however states if his bone marrow transplant is scheduled on the same day then he may need to r/s. Pt to see Dr. Mike Gip tomorrow and will find out more then.   I am unable to move the apt on 05/21/20 to 05/14/20 due to "not having access" to a blocked schedule. Please r/s apt please, thank you.

## 2020-05-02 ENCOUNTER — Telehealth: Payer: Self-pay

## 2020-05-02 ENCOUNTER — Encounter: Payer: Self-pay | Admitting: Hematology and Oncology

## 2020-05-02 ENCOUNTER — Other Ambulatory Visit: Payer: Self-pay

## 2020-05-02 ENCOUNTER — Inpatient Hospital Stay: Payer: Medicare Other | Attending: Hematology and Oncology | Admitting: Hematology and Oncology

## 2020-05-02 ENCOUNTER — Inpatient Hospital Stay: Payer: Medicare Other

## 2020-05-02 VITALS — BP 114/58 | HR 81 | Temp 96.6°F | Resp 18 | Ht 72.0 in | Wt 167.1 lb

## 2020-05-02 DIAGNOSIS — R600 Localized edema: Secondary | ICD-10-CM | POA: Diagnosis not present

## 2020-05-02 DIAGNOSIS — Z87891 Personal history of nicotine dependence: Secondary | ICD-10-CM | POA: Diagnosis not present

## 2020-05-02 DIAGNOSIS — D649 Anemia, unspecified: Secondary | ICD-10-CM | POA: Insufficient documentation

## 2020-05-02 DIAGNOSIS — Z808 Family history of malignant neoplasm of other organs or systems: Secondary | ICD-10-CM | POA: Insufficient documentation

## 2020-05-02 DIAGNOSIS — N289 Disorder of kidney and ureter, unspecified: Secondary | ICD-10-CM | POA: Diagnosis not present

## 2020-05-02 DIAGNOSIS — Z8042 Family history of malignant neoplasm of prostate: Secondary | ICD-10-CM | POA: Insufficient documentation

## 2020-05-02 DIAGNOSIS — N189 Chronic kidney disease, unspecified: Secondary | ICD-10-CM | POA: Diagnosis not present

## 2020-05-02 DIAGNOSIS — Z8701 Personal history of pneumonia (recurrent): Secondary | ICD-10-CM | POA: Diagnosis not present

## 2020-05-02 DIAGNOSIS — T451X5A Adverse effect of antineoplastic and immunosuppressive drugs, initial encounter: Secondary | ICD-10-CM | POA: Diagnosis not present

## 2020-05-02 DIAGNOSIS — M21371 Foot drop, right foot: Secondary | ICD-10-CM | POA: Diagnosis not present

## 2020-05-02 DIAGNOSIS — Z7189 Other specified counseling: Secondary | ICD-10-CM

## 2020-05-02 DIAGNOSIS — Z800 Family history of malignant neoplasm of digestive organs: Secondary | ICD-10-CM | POA: Diagnosis not present

## 2020-05-02 DIAGNOSIS — R531 Weakness: Secondary | ICD-10-CM | POA: Insufficient documentation

## 2020-05-02 DIAGNOSIS — R5383 Other fatigue: Secondary | ICD-10-CM | POA: Insufficient documentation

## 2020-05-02 DIAGNOSIS — R161 Splenomegaly, not elsewhere classified: Secondary | ICD-10-CM | POA: Diagnosis not present

## 2020-05-02 DIAGNOSIS — D6481 Anemia due to antineoplastic chemotherapy: Secondary | ICD-10-CM | POA: Diagnosis not present

## 2020-05-02 DIAGNOSIS — Z5111 Encounter for antineoplastic chemotherapy: Secondary | ICD-10-CM | POA: Diagnosis not present

## 2020-05-02 DIAGNOSIS — D72819 Decreased white blood cell count, unspecified: Secondary | ICD-10-CM | POA: Diagnosis not present

## 2020-05-02 DIAGNOSIS — N401 Enlarged prostate with lower urinary tract symptoms: Secondary | ICD-10-CM | POA: Insufficient documentation

## 2020-05-02 DIAGNOSIS — K5090 Crohn's disease, unspecified, without complications: Secondary | ICD-10-CM | POA: Insufficient documentation

## 2020-05-02 DIAGNOSIS — M5136 Other intervertebral disc degeneration, lumbar region: Secondary | ICD-10-CM | POA: Insufficient documentation

## 2020-05-02 DIAGNOSIS — R338 Other retention of urine: Secondary | ICD-10-CM | POA: Insufficient documentation

## 2020-05-02 DIAGNOSIS — Z79899 Other long term (current) drug therapy: Secondary | ICD-10-CM | POA: Insufficient documentation

## 2020-05-02 DIAGNOSIS — D701 Agranulocytosis secondary to cancer chemotherapy: Secondary | ICD-10-CM

## 2020-05-02 DIAGNOSIS — E883 Tumor lysis syndrome: Secondary | ICD-10-CM | POA: Insufficient documentation

## 2020-05-02 DIAGNOSIS — Z803 Family history of malignant neoplasm of breast: Secondary | ICD-10-CM | POA: Insufficient documentation

## 2020-05-02 DIAGNOSIS — D890 Polyclonal hypergammaglobulinemia: Secondary | ICD-10-CM | POA: Insufficient documentation

## 2020-05-02 DIAGNOSIS — C9200 Acute myeloblastic leukemia, not having achieved remission: Secondary | ICD-10-CM | POA: Diagnosis not present

## 2020-05-02 DIAGNOSIS — C7951 Secondary malignant neoplasm of bone: Secondary | ICD-10-CM | POA: Diagnosis not present

## 2020-05-02 DIAGNOSIS — E871 Hypo-osmolality and hyponatremia: Secondary | ICD-10-CM | POA: Insufficient documentation

## 2020-05-02 LAB — CBC WITH DIFFERENTIAL/PLATELET
Abs Immature Granulocytes: 0 10*3/uL (ref 0.00–0.07)
Basophils Absolute: 0 10*3/uL (ref 0.0–0.1)
Basophils Relative: 0 %
Blasts: 10 %
Eosinophils Absolute: 0 10*3/uL (ref 0.0–0.5)
Eosinophils Relative: 0 %
HCT: 26.5 % — ABNORMAL LOW (ref 39.0–52.0)
Hemoglobin: 8.5 g/dL — ABNORMAL LOW (ref 13.0–17.0)
Lymphocytes Relative: 62 %
Lymphs Abs: 0.5 10*3/uL — ABNORMAL LOW (ref 0.7–4.0)
MCH: 28 pg (ref 26.0–34.0)
MCHC: 32.1 g/dL (ref 30.0–36.0)
MCV: 87.2 fL (ref 80.0–100.0)
Monocytes Absolute: 0.1 10*3/uL (ref 0.1–1.0)
Monocytes Relative: 14 %
Myelocytes: 2 %
Neutro Abs: 0.1 10*3/uL — ABNORMAL LOW (ref 1.7–7.7)
Neutrophils Relative %: 12 %
Platelets: 140 10*3/uL — ABNORMAL LOW (ref 150–400)
RBC: 3.04 MIL/uL — ABNORMAL LOW (ref 4.22–5.81)
RDW: 19.5 % — ABNORMAL HIGH (ref 11.5–15.5)
WBC: 0.8 10*3/uL — CL (ref 4.0–10.5)
nRBC: 2 /100 WBC — ABNORMAL HIGH
nRBC: 2.6 % — ABNORMAL HIGH (ref 0.0–0.2)

## 2020-05-02 LAB — BASIC METABOLIC PANEL
Anion gap: 6 (ref 5–15)
BUN: 53 mg/dL — ABNORMAL HIGH (ref 8–23)
CO2: 28 mmol/L (ref 22–32)
Calcium: 8.8 mg/dL — ABNORMAL LOW (ref 8.9–10.3)
Chloride: 98 mmol/L (ref 98–111)
Creatinine, Ser: 2.02 mg/dL — ABNORMAL HIGH (ref 0.61–1.24)
GFR calc Af Amer: 37 mL/min — ABNORMAL LOW (ref >60)
GFR calc non Af Amer: 32 mL/min — ABNORMAL LOW (ref >60)
Glucose, Bld: 108 mg/dL — ABNORMAL HIGH (ref 70–99)
Potassium: 3.9 mmol/L (ref 3.5–5.1)
Sodium: 132 mmol/L — ABNORMAL LOW (ref 135–145)

## 2020-05-02 LAB — SAMPLE TO BLOOD BANK

## 2020-05-02 LAB — FUNGITELL, SERUM: Fungitell Result: <31 pg/mL (ref <80)

## 2020-05-02 NOTE — Telephone Encounter (Signed)
spoke with Ms [name] to inform her that Mr Guadamuz WBC is 0.8 and Neutro abs 0.1  and hbg 8.5. and he do not need ablood transfusion at this time. He is scheduel to see Dr Mike Gip on 05/10/2020 for a 9:45 appointment. Continue Neutropenia precautions. Ms [name] was understanding and agreeable.

## 2020-05-02 NOTE — Progress Notes (Signed)
Per his wife she states swollen to bilateral legs.

## 2020-05-02 NOTE — Telephone Encounter (Signed)
Lab called and reports the patient have a critical lab WBC 0.8 and Neutro Abs 0.1. MD has been aware.

## 2020-05-03 LAB — PATHOLOGIST SMEAR REVIEW

## 2020-05-04 ENCOUNTER — Telehealth: Payer: Self-pay

## 2020-05-04 MED FILL — VALACYCLOVIR 500 MG TABLET: 30 days supply | Qty: 30 | Fill #0 | Status: AC

## 2020-05-04 NOTE — Unmapped (Signed)
I received a message from Dr. Anise Salvo that the local oncologist had notified her that the patient/wife needed to hear from our team about the plan of care and appointments.      I called the patient's wife, [name], who stated she had been attempting to call triage # on repeat today.  I explained that our systems had been down and we had not been able to receive calls or access patient contact information, but I would be happy to answer questions now.    We reviewed all upcoming appointments (BMBx, clinic visit, labs/transfusions).  They will be unable to make it on the 16th d/t a repair needing to be made on their garage Monday morning, which currently has their car trapped.  There is currently a 1:30pm BMBx slot open on 8/17.  I reviewed that the appt times would be if we could move them to that slot, but let them know that our scheduling system is still down so we will have to work on that change tomorrow.  If we cannot end up moving them to that slot, we will call them to work something else out.    Active listening and emotional support provided.    Also gave them the phone # to the [employer] office, as [name] had questions about valet parking.

## 2020-05-04 NOTE — Telephone Encounter (Signed)
Spoke with Ms [name] to see if i could help with anything. She report the patient is schedule for Bone marrow bx 05/08/2020 12:30 for labs and 1:30 for Bone marrow bx and if needed blood transfusion afterwards. Ms [name] also reports the patient is schedule to [facility] on 05/14/2020 for labs and clinic visit.

## 2020-05-07 ENCOUNTER — Other Ambulatory Visit: Payer: Self-pay

## 2020-05-07 DIAGNOSIS — C9200 Acute myeloblastic leukemia, not having achieved remission: Secondary | ICD-10-CM

## 2020-05-08 ENCOUNTER — Ambulatory Visit: Admit: 2020-05-08 | Discharge: 2020-05-09 | Payer: MEDICARE

## 2020-05-08 ENCOUNTER — Other Ambulatory Visit: Admit: 2020-05-08 | Discharge: 2020-05-09 | Payer: MEDICARE

## 2020-05-08 DIAGNOSIS — C9200 Acute myeloblastic leukemia, not having achieved remission: Secondary | ICD-10-CM | POA: Diagnosis not present

## 2020-05-08 DIAGNOSIS — D471 Chronic myeloproliferative disease: Principal | ICD-10-CM

## 2020-05-08 LAB — COMPREHENSIVE METABOLIC PANEL
ALKALINE PHOSPHATASE: 87 U/L (ref 46–116)
ALT (SGPT): 13 U/L (ref 10–49)
ANION GAP: 6 mmol/L (ref 5–14)
BILIRUBIN TOTAL: 1.3 mg/dL — ABNORMAL HIGH (ref 0.3–1.2)
BLOOD UREA NITROGEN: 51 mg/dL — ABNORMAL HIGH (ref 9–23)
BUN / CREAT RATIO: 27
CALCIUM: 8.5 mg/dL — ABNORMAL LOW (ref 8.7–10.4)
CHLORIDE: 96 mmol/L — ABNORMAL LOW (ref 98–107)
CREATININE: 1.88 mg/dL — ABNORMAL HIGH
EGFR CKD-EPI AA MALE: 40 mL/min/{1.73_m2} — ABNORMAL LOW (ref >=60)
EGFR CKD-EPI NON-AA MALE: 35 mL/min/{1.73_m2} — ABNORMAL LOW (ref >=60)
GLUCOSE RANDOM: 100 mg/dL (ref 70–179)
POTASSIUM: 3.4 mmol/L (ref 3.4–4.5)
PROTEIN TOTAL: 8.6 g/dL — ABNORMAL HIGH (ref 5.7–8.2)
SODIUM: 132 mmol/L — ABNORMAL LOW (ref 135–145)

## 2020-05-08 LAB — CBC W/ AUTO DIFF
BASOPHILS ABSOLUTE COUNT: 0 10*9/L (ref 0.0–0.1)
BASOPHILS RELATIVE PERCENT: 1.5 %
EOSINOPHILS ABSOLUTE COUNT: 0 10*9/L (ref 0.0–0.4)
EOSINOPHILS RELATIVE PERCENT: 0 %
HEMATOCRIT: 18.5 % — ABNORMAL LOW (ref 41.0–53.0)
HEMOGLOBIN: 6.2 g/dL — ABNORMAL LOW (ref 13.5–17.5)
LARGE UNSTAINED CELLS: 18 % — ABNORMAL HIGH (ref 0–4)
LYMPHOCYTES ABSOLUTE COUNT: 0.7 10*9/L — ABNORMAL LOW (ref 1.5–5.0)
LYMPHOCYTES RELATIVE PERCENT: 63.7 %
MEAN CORPUSCULAR HEMOGLOBIN CONC: 33.4 g/dL (ref 31.0–37.0)
MEAN CORPUSCULAR HEMOGLOBIN: 29.1 pg (ref 26.0–34.0)
MEAN CORPUSCULAR VOLUME: 87.2 fL (ref 80.0–100.0)
MEAN PLATELET VOLUME: 11.2 fL — ABNORMAL HIGH (ref 7.0–10.0)
MONOCYTES ABSOLUTE COUNT: 0 10*9/L — ABNORMAL LOW (ref 0.2–0.8)
MONOCYTES RELATIVE PERCENT: 3.6 %
NEUTROPHILS ABSOLUTE COUNT: 0.2 10*9/L — CL (ref 2.0–7.5)
NEUTROPHILS RELATIVE PERCENT: 13.7 %
NUCLEATED RED BLOOD CELLS: 3 /100{WBCs} (ref <=4)
RED BLOOD CELL COUNT: 2.13 10*12/L — ABNORMAL LOW (ref 4.50–5.90)
RED CELL DISTRIBUTION WIDTH: 20.2 % — ABNORMAL HIGH (ref 12.0–15.0)
WBC ADJUSTED: 1.1 10*9/L — ABNORMAL LOW (ref 4.5–11.0)

## 2020-05-08 LAB — SMEAR REVIEW

## 2020-05-08 LAB — PLATELET COUNT: Platelets:NCnc:Pt:Bld:Qn:Automated count: 225

## 2020-05-08 LAB — EGFR CKD-EPI AA MALE
Glomerular filtration rate/1.73 sq M.predicted.black:ArVRat:Pt:Ser/Plas/Bld:Qn:Creatinine-based formula (CKD-EPI): 40 — ABNORMAL LOW

## 2020-05-08 MED ADMIN — lidocaine (XYLOCAINE) 20 mg/mL (2 %) injection 10 mL: 10 mL | @ 18:00:00 | Stop: 2020-05-08

## 2020-05-08 NOTE — Unmapped (Signed)
Please leave dressing in place and keep it dry for 24 hrs before removing. You can resume normal activities tomorrow, but take things easy today.     You may take tylenol as needed for discomfort at the area where the biopsy was taken.   After receiving Ativan, please do not drive today.     If you have questions between 8am to 5 pm Monday through Friday please call [phone] and speak to the operator.      For emergencies, evenings or weekends, please call [phone] and ask for oncology fellow on call.     Reasons to call emergency line may include:   Fever of 100.5 or greater   Nausea and/or vomiting not relieved with nausea medicine   Diarrhea or constipation   Severe pain not relieved with usual pain regimen       Lab on 05/08/2020   Component Date Value Ref Range Status   ??? ABO Grouping 05/08/2020 O POS   Final   ??? Antibody Screen 05/08/2020 NEG   Final   ??? WBC 05/08/2020 1.1* 4.5 - 11.0 10*9/L Final   ??? RBC 05/08/2020 2.13* 4.50 - 5.90 10*12/L Final   ??? HGB 05/08/2020 6.2* 13.5 - 17.5 g/dL Final   ??? HCT 29/56/2130 18.5* 41.0 - 53.0 % Final   ??? MCV 05/08/2020 87.2  80.0 - 100.0 fL Final   ??? MCH 05/08/2020 29.1  26.0 - 34.0 pg Final   ??? MCHC 05/08/2020 33.4  31.0 - 37.0 g/dL Final   ??? RDW 86/57/8469 20.2* 12.0 - 15.0 % Final   ??? MPV 05/08/2020 11.2* 7.0 - 10.0 fL Final   ??? Platelet 05/08/2020 225  150 - 440 10*9/L Final   ??? nRBC 05/08/2020 3  <=4 /100 WBCs Final   ??? Neutrophils % 05/08/2020 13.7  % Final   ??? Lymphocytes % 05/08/2020 63.7  % Final   ??? Monocytes % 05/08/2020 3.6  % Final   ??? Eosinophils % 05/08/2020 0.0  % Final   ??? Basophils % 05/08/2020 1.5  % Final   ??? Absolute Neutrophils 05/08/2020 0.2* 2.0 - 7.5 10*9/L Final   ??? Absolute Lymphocytes 05/08/2020 0.7* 1.5 - 5.0 10*9/L Final   ??? Absolute Monocytes 05/08/2020 0.0* 0.2 - 0.8 10*9/L Final   ??? Absolute Eosinophils 05/08/2020 0.0  0.0 - 0.4 10*9/L Final   ??? Absolute Basophils 05/08/2020 0.0  0.0 - 0.1 10*9/L Final   ??? Large Unstained Cells 05/08/2020 18* 0 - 4 % Final    Blasts Present   ??? Microcytosis 05/08/2020 Slight* Not Present Final   ??? Macrocytosis 05/08/2020 Slight* Not Present Final   ??? Anisocytosis 05/08/2020 Moderate* Not Present Final   ??? Hypochromasia 05/08/2020 Moderate* Not Present Final   ??? Sodium 05/08/2020 132* 135 - 145 mmol/L Final   ??? Potassium 05/08/2020 3.4  3.4 - 4.5 mmol/L Final   ??? Chloride 05/08/2020 96* 98 - 107 mmol/L Final   ??? Anion Gap 05/08/2020 6  5 - 14 mmol/L Final   ??? CO2 05/08/2020 30.0  20.0 - 31.0 mmol/L Final   ??? BUN 05/08/2020 51* 9 - 23 mg/dL Final   ??? Creatinine 05/08/2020 1.88* 0.60 - 1.10 mg/dL Final   ??? BUN/Creatinine Ratio 05/08/2020 27   Final   ??? EGFR CKD-EPI Non-African American,* 05/08/2020 35* >=60 mL/min/1.54m2 Final   ??? EGFR CKD-EPI African American, Male 05/08/2020 40* >=60 mL/min/1.26m2 Final   ??? Glucose 05/08/2020 100  70 -  179 mg/dL Final   ??? Calcium 16/06/9603 8.5* 8.7 - 10.4 mg/dL Final   ??? Albumin 54/05/8118 2.6* 3.4 - 5.0 g/dL Final   ??? Total Protein 05/08/2020 8.6* 5.7 - 8.2 g/dL Final   ??? Total Bilirubin 05/08/2020 1.3* 0.3 - 1.2 mg/dL Final   ??? AST 14/78/2956 42* <=34 U/L Final   ??? ALT 05/08/2020 13  10 - 49 U/L Final   ??? Alkaline Phosphatase 05/08/2020 87  46 - 116 U/L Final   ??? Smear Review Comments 05/08/2020 See Comment* Undefined Final    Blasts Present. Myelocytes present. Promyelocytes present-rare.   Hospital Outpatient Visit on 05/08/2020   Component Date Value Ref Range Status   ??? Crossmatch 05/08/2020 Compatible   Final   ??? Unit Blood Type 05/08/2020 O Pos   Final   ??? ISBT Number 05/08/2020 5100   Final   ??? Unit # 05/08/2020 [id number]   Final   ??? Status 05/08/2020 Ready   Final   ??? Spec Expiration 05/08/2020 [id number]   Final   ??? Product ID 05/08/2020 Red Blood Cells   Final   ??? PRODUCT CODE 05/08/2020 [id number]   Final   ??? Crossmatch 05/08/2020 Compatible   Final   ??? Unit Blood Type 05/08/2020 O Pos   Final   ??? ISBT Number 05/08/2020 5100   Final   ??? Unit # 05/08/2020 [id number]   Final   ??? Status 05/08/2020 Ready   Final   ??? Spec Expiration 05/08/2020 [id number]   Final   ??? Product ID 05/08/2020 Red Blood Cells   Final   ??? PRODUCT CODE 05/08/2020 [id number]   Final

## 2020-05-08 NOTE — Unmapped (Signed)
Patient Education        Learning About Blood Transfusions  What is a blood transfusion?     Blood transfusion is a medical treatment to replace the blood or parts of blood that your body has lost. The blood goes through a tube from a bag to an intravenous (IV) catheter and into your vein.  You may need a blood transfusion after losing blood from an injury, a major surgery, an illness that causes bleeding, or an illness that destroys blood cells.  Transfusions are also used to give you the parts of blood???such as platelets, plasma, or substances that cause clotting???that your body needs to fight an illness or stop bleeding.  How is a blood transfusion done?  Before you receive a blood transfusion, your blood is tested to find out what your blood type is. Blood or blood parts that are a match with your blood type are ordered by your doctor. Blood is typed as A, B, AB, or O. It is also typed as Rh-positive or Rh-negative.  Your blood is also screened to look for antibodies that might react with the blood that is given to you. The blood you are getting is checked and rechecked to make sure that it's the right type for you.  A sample of your blood is mixed with a sample of the blood you will receive to check for problems. Before actually giving you the transfusion, a doctor and nurses will look at the label on the package of blood and compare it to your hospital ID bracelet and medical records. The transfusion begins only when all agree that this is the correct blood and that you are the correct person to receive it.  To receive the transfusion, you will have an intravenous (IV) catheter inserted into a vein. A tube connects the catheter to the bag containing the blood, which is placed higher than your body. The blood then flows slowly into your vein. A doctor or nurse will check you several times during the transfusion to watch for a reaction or other problems.  What are the possible risks?  Blood transfusions have many benefits and are often life-saving. But they also have a few risks. Possible risks include:  ?? Your body's reaction to receiving new blood. This may include:  ? Fever.  ? Breathing problems.  ? Allergic reaction, such as hives, swelling, or a new rash.  ?? An infection from the blood. This risk is small because of the strict rules placed on handling and storing blood. Getting a viral infection, such as HIV or hepatitis B or C, through blood transfusions has become very rare. The U[place] Food and Drug Administration (FDA) enforces strict guidelines on the collection, testing, storage, and use of blood.  ?? Getting the wrong blood type by accident. Severe reactions, which can be life-threatening, are very rare.  ?? An infection at the transfusion site, such as redness, swelling, pain, bleeding, or pus.  How can you care for yourself at home?  To prevent infection at the transfusion site  ?? Wash the area daily with warm, soapy water, and pat it dry. Don't use hydrogen peroxide or alcohol, which can slow healing. You may cover the area with a gauze bandage if it weeps or rubs against clothing. Change the bandage every day.  ?? Keep the area clean and dry.  When should you call for help?  Call 911 anytime you think you may need emergency care. For example, call if:  ??  You have severe trouble breathing.  Call your doctor now or seek immediate medical care if:  ?? You have signs of an allergic reaction, such as hives, swelling, or a new rash.  ?? You have a fever.  ?? You feel weaker or more tired than usual.  ?? You have a yellow tint to your skin or the whites of your eyes.  ?? You have signs of an infection at the transfusion site, such as redness, swelling, pain, bleeding, or pus.  Watch closely for changes in your health, and be sure to contact your doctor if you have any problems.  Follow-up care is a key part of your treatment and safety. Be sure to make and go to all appointments, and call your doctor if you are having problems. It's also a good idea to know your test results and keep a list of the medicines you take.  Where can you learn more?  Go to [facility] at [email or web address]  Select Patient Education under [employer]. Enter V588 in the search box to learn more about Learning About Blood Transfusions.  Current as of: June 15, 2019??????????????????????????????Content Version: 12.9  ?? 2006-2021 [employer].   Care instructions adapted under license by [facility]. If you have questions about a medical condition or this instruction, always ask your healthcare professional. [employer] disclaims any warranty or liability for your use of this information.         Lab on 05/08/2020   Component Date Value Ref Range Status   ??? ABO Grouping 05/08/2020 O POS   Final   ??? Antibody Screen 05/08/2020 NEG   Final   ??? WBC 05/08/2020 1.1* 4.5 - 11.0 10*9/L Final   ??? RBC 05/08/2020 2.13* 4.50 - 5.90 10*12/L Final   ??? HGB 05/08/2020 6.2* 13.5 - 17.5 g/dL Final   ??? HCT 36/64/4034 18.5* 41.0 - 53.0 % Final   ??? MCV 05/08/2020 87.2  80.0 - 100.0 fL Final   ??? MCH 05/08/2020 29.1  26.0 - 34.0 pg Final   ??? MCHC 05/08/2020 33.4  31.0 - 37.0 g/dL Final   ??? RDW 74/25/9563 20.2* 12.0 - 15.0 % Final   ??? MPV 05/08/2020 11.2* 7.0 - 10.0 fL Final   ??? Platelet 05/08/2020 225  150 - 440 10*9/L Final   ??? nRBC 05/08/2020 3  <=4 /100 WBCs Final   ??? Neutrophils % 05/08/2020 13.7  % Final   ??? Lymphocytes % 05/08/2020 63.7  % Final   ??? Monocytes % 05/08/2020 3.6  % Final   ??? Eosinophils % 05/08/2020 0.0  % Final   ??? Basophils % 05/08/2020 1.5  % Final   ??? Absolute Neutrophils 05/08/2020 0.2* 2.0 - 7.5 10*9/L Final   ??? Absolute Lymphocytes 05/08/2020 0.7* 1.5 - 5.0 10*9/L Final   ??? Absolute Monocytes 05/08/2020 0.0* 0.2 - 0.8 10*9/L Final   ??? Absolute Eosinophils 05/08/2020 0.0  0.0 - 0.4 10*9/L Final   ??? Absolute Basophils 05/08/2020 0.0  0.0 - 0.1 10*9/L Final   ??? Large Unstained Cells 05/08/2020 18* 0 - 4 % Final    Blasts Present   ??? Microcytosis 05/08/2020 Slight* Not Present Final   ??? Macrocytosis 05/08/2020 Slight* Not Present Final   ??? Anisocytosis 05/08/2020 Moderate* Not Present Final   ??? Hypochromasia 05/08/2020 Moderate* Not Present Final   ??? Sodium 05/08/2020 132* 135 - 145 mmol/L Final   ??? Potassium 05/08/2020 3.4  3.4 - 4.5 mmol/L Final   ??? Chloride 05/08/2020  96* 98 - 107 mmol/L Final   ??? Anion Gap 05/08/2020 6  5 - 14 mmol/L Final   ??? CO2 05/08/2020 30.0  20.0 - 31.0 mmol/L Final   ??? BUN 05/08/2020 51* 9 - 23 mg/dL Final   ??? Creatinine 05/08/2020 1.88* 0.60 - 1.10 mg/dL Final   ??? BUN/Creatinine Ratio 05/08/2020 27   Final   ??? EGFR CKD-EPI Non-African American,* 05/08/2020 35* >=60 mL/min/1.32m2 Final   ??? EGFR CKD-EPI African American, Male 05/08/2020 40* >=60 mL/min/1.59m2 Final   ??? Glucose 05/08/2020 100  70 - 179 mg/dL Final   ??? Calcium 09/81/1914 8.5* 8.7 - 10.4 mg/dL Final   ??? Albumin 78/29/5621 2.6* 3.4 - 5.0 g/dL Final   ??? Total Protein 05/08/2020 8.6* 5.7 - 8.2 g/dL Final   ??? Total Bilirubin 05/08/2020 1.3* 0.3 - 1.2 mg/dL Final   ??? AST 30/86/5784 42* <=34 U/L Final   ??? ALT 05/08/2020 13  10 - 49 U/L Final   ??? Alkaline Phosphatase 05/08/2020 87  46 - 116 U/L Final   ??? Smear Review Comments 05/08/2020 See Comment* Undefined Final    Blasts Present. Myelocytes present. Promyelocytes present-rare.   Hospital Outpatient Visit on 05/08/2020   Component Date Value Ref Range Status   ??? Crossmatch 05/08/2020 Compatible   Final   ??? Unit Blood Type 05/08/2020 O Pos   Final   ??? ISBT Number 05/08/2020 5100   Final   ??? Unit # 05/08/2020 [id number]   Final   ??? Status 05/08/2020 Issued   Final   ??? Spec Expiration 05/08/2020 [id number]   Final   ??? Product ID 05/08/2020 Red Blood Cells   Final   ??? PRODUCT CODE 05/08/2020 [id number]   Final   ??? Crossmatch 05/08/2020 Compatible   Final   ??? Unit Blood Type 05/08/2020 O Pos   Final   ??? ISBT Number 05/08/2020 5100   Final   ??? Unit # 05/08/2020 [id number]   Final   ??? Status 05/08/2020 Issued   Final   ??? Spec Expiration 05/08/2020 [id number]   Final   ??? Product ID 05/08/2020 Red Blood Cells   Final   ??? PRODUCT CODE 05/08/2020 [id number]   Final   Hospital Outpatient Visit on 05/08/2020   Component Date Value Ref Range Status   ??? Cytogenetics Test, Other 05/08/2020 Collected   Final   ??? Collection 05/08/2020 Collected   Final

## 2020-05-08 NOTE — Unmapped (Unsigned)
Date of Service: 05/08/2020      Patient Active Problem List   Diagnosis   ??? Polycythemia vera (CMS-HCC)   ??? MPN (myeloproliferative neoplasm) (CMS-HCC)   ??? Iron deficiency anemia   ??? 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  The patient verbalized understanding and signed informed consent. After a time-out in which his patient identifiers were checked by 2 providers, the patient was laid in prone position on the table.   The  posterior superior iliac spine and iliac crest were cleaned, prepped and draped in the usual sterile fashion.     Anesthetic agent used: 2% plain lidocaine.      Utilizing a Ranfac needle, a bone marrow aspiration and biopsy was performed.  Specimen was sent for routine histopathologic stains and sectioning, flow cytometry, cytogenetics and molecular analysis.     A pressure dressing was applied to the biopsy site.  Patient tolerated the procedure well.  Hemostasis was confirmed upon discharge.     The patient was given verbal instructions for wound care, such as to keep the biopsy site dry, covered for 24 hours, and to call your physician for a temperature > 100.5.  Tylenol may be taken for discomfort.    Specimens Collected:  EDTA x 2  Heparin x 1  Core biopsy x 2

## 2020-05-08 NOTE — Unmapped (Signed)
Pt to clinic for BMBX.  Procedure tolerated well.  Pt also receiving x2 units of PRBC's for a Hgb of 6.2, pt tolerated well.  Pt left via wheelchair with staff and spouse.

## 2020-05-08 NOTE — Unmapped (Unsigned)
PIV inserted and labs drawn with no complications by Ethelda Chick  .  PIV flushed with saline.

## 2020-05-08 NOTE — Unmapped (Incomplete)
Outpatient Sw Follow-up Note    Sw met with patient and his spouse [name] in the Infusion clinic.  Patient was somnolent so Sw provided active listening and support to [place].  She shared about the patient's recent hospitalization.  She also spoke about how he has no energy is no longer able to do things around the house he likes to do.  She shared that they were both hoping this time would be different for him.    Discussed good self care for [name] and encouraged her again to visit the [facility] while she is here; explained again about the massage chairs that are available for her to use.      [name] stated that they used to be able to go to the [facility] for some of their appointments and wondered if this is an option for them.  Sw agreed to send Wynona Meals, NN, a message regarding this.  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    [name] had questions regarding the patient's medical bill; Sw accompanied her to the [facility], Inetta Fermo, who can assist her with this.    No other issues or concerns reported at this time.   Sw will remain available to provide additional support, information, and resources as needed.     Sherran Needs, MSW, LCSW

## 2020-05-09 NOTE — Progress Notes (Signed)
[facility]  [address], [address] [place], [place] [address] Phone: [phone]  Fax: [phone]   Clinic Day:  05/10/2020  Referring physician: [name], MD  Chief Complaint: Ruben [name] is a 74 y.o. 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   HPI: The patient was last seen in the was last seen in the hematology clinic on 05/02/2020.  At that time, he was feeling a little better.  He had no fevers at home.  Labs included a hematocrit 26.5, hemoglobin 8.5, platelets 140,000, WBC 800 (ANC 100). Sodium was 132. Creatinine 2.02. Calcium was 8.8.  We discussed plans for bone marrow.  Peripheral smear on 05/02/2020 showed absolute leukopenia, with circulating blasts, 6% by manual differential count.  He had a normocytic anemia, with few nucleated RBCs.  There was thrombocytopenia, with unremarkable morphology.  Findings were compatible with treatment for acute leukemia.   [facility] labs 05/08/2020: Hematocrit 18.5, hemoglobin 6.2, platelets 225,000, WBC 1,100 (ANC 200). Sodium 132, creatinine 1.88, calcium 8.5, albumin 2.6, total protein 8.6, total bilirubin 1.3, AST 42.  He received 2 units PRBCs on 05/08/2020.   During the interim, he felt "so so".  He reports having a bone marrow on Monday, 08. His weight is down 9 pounds. [name] notes he is eating but eats very slow.  He has shortness of breath on exertion. [name] reports his leg swelling has improved some. He denies having any fevers. He has some diarrhea on and off.  He denies any bleeding of any kind.   Patient is interested in [place] care services rather than Hospice.  Potassium is 2.7 today. I discussed oral vs IV potassium.    Past Medical History:  Diagnosis Date  . Blood dyscrasia   . BPH (benign prostatic hyperplasia)   . Cancer ([place])    SKIN/ POLYCYTHEMIA VERA  . Chronic  kidney disease    RENAL INSUFF (40%)  . Crohn disease ([place])   . Crohn's disease ([place])   . Glaucoma   . Glaucoma   . Gout   . History of chicken pox   . Myelofibrosis ([place])   . Nosebleed   . RBBB   . Right bundle branch block     Past Surgical History:  Procedure Laterality Date  . CATARACT EXTRACTION W/PHACO Left 07/13/2018   Procedure: CATARACT EXTRACTION PHACO AND INTRAOCULAR LENS PLACEMENT (IOC);  Surgeon: [name], MD;  Location: [facility] ORS;  Service: Ophthalmology;  Laterality: Left;  [place] 00:39  CDE 5.14 Fluid pack lot # [id number]  . CATARACT EXTRACTION W/PHACO Right 08/03/2018   Procedure: CATARACT EXTRACTION PHACO AND INTRAOCULAR LENS PLACEMENT (IOC);  Surgeon: [name], MD;  Location: [facility] ORS;  Service: Ophthalmology;  Laterality: Right;  [place] 00:43.4 CDE 5.23 Fluid pack Lot # [id number]  . COLONOSCOPY WITH PROPOFOL N/A 03/27/2017   Procedure: COLONOSCOPY WITH PROPOFOL;  Surgeon: [name], MD;  Location: [facility] ENDOSCOPY;  Service: Endoscopy;  Laterality: N/A;  . MOHS SURGERY     EAR  . PROSTATE SURGERY  2008   Prostate Biopsy in [place] to elevated PSA.  per patient normal  . Skin Lesion Basal cell removed    . wart removal     from eyelid    Family History  Problem Relation Age of Onset  . Cancer Mother        Liver  .  Cancer Father        Prostate  . Cancer Sister        Hodgkins lymphoma  . Cancer Paternal Aunt        Breast    Social History:  reports that he quit smoking about 16 years ago. His smoking use included cigarettes. He has a 5.00 pack-year smoking history. He has never used smokeless tobacco. He reports previous alcohol use of about 4.0 - 5.0 standard drinks of alcohol per week. He reports that he does not use drugs. The patientpreviously worked for [employer]. He no longer volunteers for [employer]. The patient's wife isJudith([phone]).He lives in [place]. The patient is accompanied by [name]  today.  Allergies: No Known Allergies  Current Medications: Current Outpatient Medications  Medication Sig Dispense Refill  . acetaminophen (TYLENOL) 500 MG tablet Take 500 mg by mouth daily as needed for moderate pain.    Marland Kitchen allopurinol (ZYLOPRIM) 300 MG tablet TAKE 1/2 TABLET (150 MG TOTAL) BY MOUTH DAILY. 45 tablet 1  . Cyanocobalamin (RA VITAMIN B-12 TR) 1000 MCG TBCR Take 1,000 mcg by mouth daily at 12 noon.     . finasteride (PROSCAR) 5 MG tablet Take 1 tablet (5 mg total) by mouth every evening. 90 tablet 3  . folic acid (FOLVITE) 1 MG tablet Take 1 mg by mouth daily at 12 noon.     . latanoprost (XALATAN) 0.005 % ophthalmic solution Place 1 drop into both eyes at bedtime.     Marland Kitchen levofloxacin (LEVAQUIN) 250 MG tablet Take 250 mg by mouth daily.    . midodrine (PROAMATINE) 5 MG tablet Take 1 tablet (5 mg total) by mouth 3 (three) times daily with meals. 90 tablet 0  . POSACONAZOLE PO Take 300 mg by mouth daily.    . sodium bicarbonate 650 MG tablet Take 650 mg by mouth 2 (two) times daily.    . timolol (TIMOPTIC) 0.5 % ophthalmic solution Place 1 drop into both eyes daily. In AM  5  . torsemide (DEMADEX) 20 MG tablet Take 1 tablet (20 mg total) by mouth daily. 30 tablet 0  . tranexamic acid (LYSTEDA) 650 MG TABS tablet Take 650 mg by mouth 2 (two) times daily.    . valACYclovir (VALTREX) 500 MG tablet Take 500 mg by mouth daily.    . cyanocobalamin 1000 MCG tablet Take by mouth. (Patient not taking: Reported on 05/02/2020)     No current facility-administered medications for this visit.    Review of Systems  Constitutional: Positive for weight loss (9 lbs). Negative for chills, diaphoresis, fever and malaise/fatigue.       Feels "so so".  HENT: Negative for congestion, ear discharge, ear pain, hearing loss, nosebleeds, sinus pain, sore throat and tinnitus.   Eyes: Negative for blurred vision.  Respiratory: Positive for shortness of breath (on walking). Negative for cough, hemoptysis  and sputum production.   Cardiovascular: Positive for leg swelling (improving). Negative for chest pain and palpitations.  Gastrointestinal: Positive for diarrhea (on and off). Negative for abdominal pain, blood in stool, constipation, heartburn, melena, nausea and vomiting.       Eating well.  Genitourinary: Negative for dysuria, frequency, hematuria and urgency.  Musculoskeletal: Negative for back pain, joint pain, myalgias and neck pain.  Skin: Negative for itching and rash.  Neurological: Positive for weakness (generalized, improved). Negative for dizziness, tingling, sensory change and headaches.  Endo/Heme/Allergies: Does not bruise/bleed easily.  Psychiatric/Behavioral: Negative for depression and memory loss. The patient  is not nervous/anxious and does not have insomnia.   All other systems reviewed and are negative.  Performance status (ECOG): 2  Vitals Blood pressure (!) 111/59, pulse 70, temperature (!) 95 F (35 C), resp. rate 20, weight 158 lb 2.9 oz (71.7 kg), SpO2 100 %.   Physical Exam Vitals and nursing note reviewed.  Constitutional:      General: He is not in acute distress.    Appearance: He is not diaphoretic.     Comments: Chronically fatigued appearing gentleman in no acute distress.   HENT:     Head: Normocephalic and atraumatic.     Comments: Short thin gray hair with temporal wasting.    Mouth/Throat:     Mouth: Mucous membranes are moist.     Pharynx: Oropharynx is clear.  Eyes:     General: No scleral icterus.    Extraocular Movements: Extraocular movements intact.     Conjunctiva/sclera: Conjunctivae normal.     Pupils: Pupils are equal, round, and reactive to light.     Comments: Black rimmed glasses.  Cardiovascular:     Rate and Rhythm: Normal rate and regular rhythm.     Heart sounds: Normal heart sounds. No murmur heard.   Pulmonary:     Effort: Pulmonary effort is normal. No respiratory distress.     Breath sounds: Normal breath sounds. No  wheezing or rales.  Chest:     Chest wall: No tenderness.  Abdominal:     General: Bowel sounds are normal. There is no distension.     Palpations: There is splenomegaly (7 cm below left costal margin). There is no hepatomegaly or mass.     Tenderness: There is no abdominal tenderness. There is no guarding or rebound.  Musculoskeletal:        General: No swelling or tenderness.     Cervical back: Normal range of motion and neck supple.     Comments: 2+ bilateral below the knee edema (right > left).  Lymphadenopathy:     Head:     Right side of head: No preauricular, posterior auricular or occipital adenopathy.     Left side of head: No preauricular, posterior auricular or occipital adenopathy.     Cervical: No cervical adenopathy.     Upper Body:     Right upper body: No supraclavicular or axillary adenopathy.     Left upper body: No supraclavicular or axillary adenopathy.     Lower Body: No right inguinal adenopathy. No left inguinal adenopathy.  Skin:    General: Skin is warm and dry.  Neurological:     Mental Status: He is alert and oriented to person, place, and time.  Psychiatric:        Behavior: Behavior normal.        Thought Content: Thought content normal.        Judgment: Judgment normal.    Appointment on 05/10/2020  Component Date Value Ref Range Status  . WBC 05/10/2020 PENDING  4.0 - 10.5 K/uL Incomplete  . RBC 05/10/2020 2.23* 4.22 - 5.81 MIL/uL Final  . Hemoglobin 05/10/2020 6.2* 13.0 - 17.0 g/dL Final  . HCT 05/10/2020 19.0* 39 - 52 % Final  . MCV 05/10/2020 85.2  80.0 - 100.0 fL Final  . MCH 05/10/2020 27.8  26.0 - 34.0 pg Final  . MCHC 05/10/2020 32.6  30.0 - 36.0 g/dL Final  . RDW 05/10/2020 18.6* 11.5 - 15.5 % Final  . Platelets 05/10/2020 208  150 - 400 K/uL Final  Performed at [facility], [address] [place], [place] [address]  . nRBC 05/10/2020 PENDING  0.0 - 0.2 % Incomplete  . Neutrophils Relative % 05/10/2020 PENDING  %  Incomplete  . Neutro Abs 05/10/2020 PENDING  1.7 - 7.7 K/uL Incomplete  . Band Neutrophils 05/10/2020 PENDING  % Incomplete  . Lymphocytes Relative 05/10/2020 PENDING  % Incomplete  . Lymphs Abs 05/10/2020 PENDING  0.7 - 4.0 K/uL Incomplete  . Monocytes Relative 05/10/2020 PENDING  % Incomplete  . Monocytes Absolute 05/10/2020 PENDING  0 - 1 K/uL Incomplete  . Eosinophils Relative 05/10/2020 PENDING  % Incomplete  . Eosinophils Absolute 05/10/2020 PENDING  0 - 0 K/uL Incomplete  . Basophils Relative 05/10/2020 PENDING  % Incomplete  . Basophils Absolute 05/10/2020 PENDING  0 - 0 K/uL Incomplete  . WBC Morphology 05/10/2020 PENDING   Incomplete  . RBC Morphology 05/10/2020 PENDING   Incomplete  . Smear Review 05/10/2020 PENDING   Incomplete  . Other 05/10/2020 PENDING  % Incomplete  . nRBC 05/10/2020 PENDING  0 /100 WBC Incomplete  . Metamyelocytes Relative 05/10/2020 PENDING  % Incomplete  . Myelocytes 05/10/2020 PENDING  % Incomplete  . Promyelocytes Relative 05/10/2020 PENDING  % Incomplete  . Blasts 05/10/2020 PENDING  % Incomplete  . Immature Granulocytes 05/10/2020 PENDING  % Incomplete  . Abs Immature Granulocytes 05/10/2020 PENDING  0.00 - 0.07 K/uL Incomplete    Assessment:  Ruben [name] is a 74 y.o. 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 He has had polycythemia dating back to 2013. Hematocrit was 62.1 with a hemoglobin of 19.8 on 03/28/2015. JAK 2 testing on 03/28/2015 revealed [id number] mutation. Erythropoietin level was 1.1 (low). 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  He began a phlebotomyprogram on 03/28/2015 to maintain a hematocrit goal of <45.Last phlebotomywason 05/02/2015. He is on a baby aspirin.  He was initially on hydroxyurea beginning in 11/2015 then Jakafi (05/28/2016 - **/2021).  Bone marrowon 12/14/2015 revealed a persistent myeloproliferative neoplasm with myelofibrosis and alterations compatible with  myelodysplatic progression. Marrow was packed (95-100% cellularity) with pan myelosis, multi-lineage dyspoiesis, and no significant increase in blasts. There was moderate to focally marked reticulin fibrosis (grade 2-3/3). Storage iron was not identified. Flow cytometry revealed non-specific atypical myeloid findings with no increase in blasts. 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 Cytogenetics were normal (46, XY).  B[facility]on 04/07/[facility] revealed a hypercellular marrow (>95%) with persistent involvement by myeloid neoplasm with 5% blasts. There was mild reticulin fibrosis. FISH t(9;22) results were normal. 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 Cytogenetics are pending.  Bone marrowon 08/09/2018 revealed a hypercellular bone marrow (>95%) with persistent involvement by myeloid neoplasm with <1% blasts by manual touch preparation differential. There was marked reticulin fibrosis with focal collagen deposition. Cytogenetics were normal (46, XY). - Myeloid mutation panel study is pending.  Bone marrowon 07/14/2019 revealed a hypercellular marrow (>95%) with persistent involvement by a myeloid neoplasm with extensive fibrosis and rare (< 5%) CD34 +blast byIHC. Myeloid mutation panel revealed the patient's previously reported variants inIDH2, JAK2, KRAS, NRAS and SRSF2 were again identified. There are no new variants. Cytogenetics are pending.  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  He was admitted to [facility] from 03/28/2020 - 04/04/2020 forcycle #1azacitidine and venetoclax.  Day #1 was 04/01/2020. He tolerated treatment well, with the exception of mild tumor lysis syndrome, diarrhea, epistaxis  x 1, and mild hyponatremia. He completed a 5 day course ofceftriaxone andazithromycin forpneumonia.  He is currently day 40 s/p cycle #1  azacytidine and venetoclax.  Peripheral smear on 05/02/2020 showed absolute leukopenia, with circulating blasts, 6% by manual differential count.  He had a normocytic anemia, with few nucleated RBCs.  There was thrombocytopenia, with unremarkable morphology.  Findings were compatible with treatment for acute leukemia.    He has urinary retention. Renal ultrasoundon 07/28/2016 revealed bilateral hydronephrosis (right >left) and a large postvoid residual (2 liters) suggesting bladder dysfunction or outlet obstruction. He underwent temporary Foley catheter placement. He performs I/O self catheterizations. He is on Flomax and finasteride. PSAwas 2.02 on 07/09/2016.  He is followed by GI (Dr. Vira Agar) for a history of polypsand Crohn's disease.He had an unremarkable colonoscopy in 03/2017.AbdomenandpelvisCTon 04/03/2019 revealed mild wall thickening of portions of a loop of small bowel within the pelvis with intervening areas of mildly dilated small bowel,consistent with active Crohn's disease. No evidence of an abscess or fistula. No other evidence of bowel inflammation.There was splenomegaly(16.7 x 7.7 x 15.6 cm), developed since12/11/2011.There was mild bladder wall thickeningandenlargement of the prostate. Chronic bladder outlet obstructionwas felt to bethe etiology of bladder wall thickening.  He has chronicrenal insufficiency(Cr 1.85; CrCl 36 ml/min). SPEP on 09/23/2016 was negative. Spot urine revealed 17.4% of 27.3 mg/dL of a monoclonal protein. 24 hour urine on 10/16/2016 revealed no monoclonal protein. Renal function transiently decreased in 06/2017 after Bactrim. 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  He has a history of epistaxis. Normal studies included: PT, PTT, von Willebrand panel, and platelet function assay (PFA).  He has right foot drop. 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 There was no non-contrast MRI evidence of brain metastasis. There was only mild for age nonspecific cerebral white signal changes. Lumbar spine MRIon 06/09/2019 showed diffusely abnormal marrow signal in the visible skeleton, as seen on the brain MRI. There was no pathologic fracture and no extraosseous extension of tumor on the lumbar spine or visible sacrum. In conjunction with the splenomegaly by CT in [place] infiltration byleukemia/lymphoma may be the most likely etiology. Differential considerations included multiple myeloma and metastatic disease unknown primary. The involvement of the pelvis should be amenable to bone marrow biopsy.There was superimposed mild for age lumbar spine degeneration with no spinal stenosis.There was mild degenerative L4 and L5 neural foraminal stenosis.  He has had a history of pneumoniasince 02/2020.  He was admitted to [facility] from 04/25/2020 - 04/30/2020 for sepsis due to pneumonia. He was treated with IV fluids and antibiotics. He received a total of 2 units of PRBCs (last 04/30/2020) for severe anemia. Hospital course was complicated by acute hypoxemic respiratory failure and pulmonary edema requiring BiPAP therapy and ICU transfer. He was treated for hypokalemia, hypomagnesemia, and hyponatremia.   Patient has a normocytic anemia.  He received 2 units of PRBCs at [facility] on 05/08/2020.   He received the Moderna COVID-19vaccineon 10/28/2019 and 11/29/2019. COVID-19 testing was negative on 02/29/2020.  Symptomatically, he is doing "so-so".  Shortness of breath with walking.  He denies any fever.  Exam reveals marked splenomegaly (7 cm below the left costal margin) and 2+ lower extremity edema.  Plan: 1.   Labs today: CBC with diff, BMP, hold tube.  2.   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 He is day 40 of cycle #1 azacitidine + Venetoclax. Venetoclax  remains on hold. Await  bone marrow from 05/08/2020.  Concern for persistent disease given 6% peripheral blasts and lack of recovery of counts. 3. Anemia and leukopenia Hematocrit 19.0.  Hemoglobin 6.2.  MCV 85.2.  Platelets 208,000.  WBC 1100 with an ANC of 200.  Patient has symptomatic anemia secondary to chemotherapy and AML.    Discuss plans for transfusion (2 units) with 1 today and 1 tomorrow.             Maintain Hgb >8.0.  Continue neutropenic precautions. 4. Renal insufficiency and hypokalemia             Creatinine 1.93.  Potassium 2.7.  Patient continues I/O catheterization.    Supplement potassium cautiously given renal insufficiency   Potassium 20 mEq IV today with repeat potassium level tomorrow. 5.   Debilitation  Home health consult. 6.  Transfuse 1 unit PRBCs today. 7.   Potassium 20 meq IV today. 8.   RTC tomorrow in [place] for labs (potassium) and 1 unit PRBCs. 9.   RTC on Tuesday for MD assessment and labs (CBC with diff, BMP, hold tube).  I discussed the assessment and treatment plan with the patient.  The patient was provided an opportunity to ask questions and all were answered.  The patient agreed with the plan and demonstrated an understanding of the instructions.  The patient was advised to call back if the symptoms worsen or if the condition fails to improve as anticipated.  I provided 25 minutes of face-to-face time during this encounter and > 50% was spent counseling as documented under my assessment and plan.  An additional 10 minutes were spent reviewing his chart (Epic and Care Everywhere) including notes, labs, and writing orders for transfusion.    Lequita Asal, MD, PhD    05/10/2020, 10:37 AM  I, Selena Batten, am acting as scribe for [employer]. Mike Gip, MD, PhD.  I, Smaran Gaus C. Mike Gip, MD, have reviewed the above documentation for accuracy and completeness, and I agree with the above.

## 2020-05-10 ENCOUNTER — Inpatient Hospital Stay: Payer: Medicare Other

## 2020-05-10 ENCOUNTER — Other Ambulatory Visit: Payer: Self-pay

## 2020-05-10 ENCOUNTER — Telehealth: Payer: Self-pay

## 2020-05-10 ENCOUNTER — Other Ambulatory Visit: Payer: Self-pay | Admitting: Hematology and Oncology

## 2020-05-10 ENCOUNTER — Encounter: Payer: Self-pay | Admitting: Hematology and Oncology

## 2020-05-10 ENCOUNTER — Inpatient Hospital Stay (HOSPITAL_BASED_OUTPATIENT_CLINIC_OR_DEPARTMENT_OTHER): Payer: Medicare Other | Admitting: Hematology and Oncology

## 2020-05-10 VITALS — BP 111/59 | HR 70 | Temp 95.0°F | Resp 20 | Wt 158.2 lb

## 2020-05-10 VITALS — BP 120/68 | HR 72 | Temp 98.1°F | Resp 18

## 2020-05-10 DIAGNOSIS — N400 Enlarged prostate without lower urinary tract symptoms: Secondary | ICD-10-CM | POA: Diagnosis not present

## 2020-05-10 DIAGNOSIS — Z79899 Other long term (current) drug therapy: Secondary | ICD-10-CM | POA: Diagnosis not present

## 2020-05-10 DIAGNOSIS — D649 Anemia, unspecified: Secondary | ICD-10-CM | POA: Diagnosis not present

## 2020-05-10 DIAGNOSIS — K5090 Crohn's disease, unspecified, without complications: Secondary | ICD-10-CM | POA: Diagnosis not present

## 2020-05-10 DIAGNOSIS — E876 Hypokalemia: Secondary | ICD-10-CM

## 2020-05-10 DIAGNOSIS — Z7189 Other specified counseling: Secondary | ICD-10-CM | POA: Diagnosis not present

## 2020-05-10 DIAGNOSIS — C9200 Acute myeloblastic leukemia, not having achieved remission: Secondary | ICD-10-CM

## 2020-05-10 DIAGNOSIS — N289 Disorder of kidney and ureter, unspecified: Secondary | ICD-10-CM

## 2020-05-10 DIAGNOSIS — Z5111 Encounter for antineoplastic chemotherapy: Secondary | ICD-10-CM | POA: Diagnosis not present

## 2020-05-10 DIAGNOSIS — N2889 Other specified disorders of kidney and ureter: Secondary | ICD-10-CM | POA: Diagnosis not present

## 2020-05-10 DIAGNOSIS — D701 Agranulocytosis secondary to cancer chemotherapy: Secondary | ICD-10-CM

## 2020-05-10 DIAGNOSIS — R040 Epistaxis: Secondary | ICD-10-CM | POA: Diagnosis not present

## 2020-05-10 DIAGNOSIS — Z148 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: Secondary | ICD-10-CM

## 2020-05-10 DIAGNOSIS — R339 Retention of urine, unspecified: Secondary | ICD-10-CM | POA: Diagnosis not present

## 2020-05-10 DIAGNOSIS — M109 Gout, unspecified: Secondary | ICD-10-CM | POA: Diagnosis not present

## 2020-05-10 DIAGNOSIS — T451X5A Adverse effect of antineoplastic and immunosuppressive drugs, initial encounter: Secondary | ICD-10-CM | POA: Diagnosis not present

## 2020-05-10 DIAGNOSIS — I4510 Unspecified right bundle-branch block: Secondary | ICD-10-CM | POA: Diagnosis not present

## 2020-05-10 DIAGNOSIS — Z87891 Personal history of nicotine dependence: Secondary | ICD-10-CM | POA: Diagnosis not present

## 2020-05-10 LAB — CBC WITH DIFFERENTIAL/PLATELET
Abs Immature Granulocytes: 0 10*3/uL (ref 0.00–0.07)
Basophils Absolute: 0 10*3/uL (ref 0.0–0.1)
Basophils Relative: 0 %
Eosinophils Absolute: 0 10*3/uL (ref 0.0–0.5)
Eosinophils Relative: 0 %
HCT: 19 % — ABNORMAL LOW (ref 39.0–52.0)
Hemoglobin: 6.2 g/dL — ABNORMAL LOW (ref 13.0–17.0)
Lymphocytes Relative: 53 %
Lymphs Abs: 0.6 10*3/uL — ABNORMAL LOW (ref 0.7–4.0)
MCH: 27.8 pg (ref 26.0–34.0)
MCHC: 32.6 g/dL (ref 30.0–36.0)
MCV: 85.2 fL (ref 80.0–100.0)
Monocytes Absolute: 0.2 10*3/uL (ref 0.1–1.0)
Monocytes Relative: 17 %
Neutro Abs: 0.2 10*3/uL — ABNORMAL LOW (ref 1.7–7.7)
Neutrophils Relative %: 20 %
Other: 10 %
Platelets: 208 10*3/uL (ref 150–400)
RBC Morphology: NONE SEEN
RBC: 2.23 MIL/uL — ABNORMAL LOW (ref 4.22–5.81)
RDW: 18.6 % — ABNORMAL HIGH (ref 11.5–15.5)
WBC Morphology: ABNORMAL
WBC: 1.1 10*3/uL — CL (ref 4.0–10.5)
nRBC: 0 % (ref 0.0–0.2)

## 2020-05-10 LAB — BASIC METABOLIC PANEL
Anion gap: 9 (ref 5–15)
BUN: 56 mg/dL — ABNORMAL HIGH (ref 8–23)
CO2: 28 mmol/L (ref 22–32)
Calcium: 8.3 mg/dL — ABNORMAL LOW (ref 8.9–10.3)
Chloride: 97 mmol/L — ABNORMAL LOW (ref 98–111)
Creatinine, Ser: 1.93 mg/dL — ABNORMAL HIGH (ref 0.61–1.24)
GFR calc Af Amer: 39 mL/min — ABNORMAL LOW (ref >60)
GFR calc non Af Amer: 34 mL/min — ABNORMAL LOW (ref >60)
Glucose, Bld: 94 mg/dL (ref 70–99)
Potassium: 2.7 mmol/L — CL (ref 3.5–5.1)
Sodium: 134 mmol/L — ABNORMAL LOW (ref 135–145)

## 2020-05-10 LAB — SAMPLE TO BLOOD BANK

## 2020-05-10 LAB — PREPARE RBC (CROSSMATCH)

## 2020-05-10 MED ORDER — ACETAMINOPHEN 325 MG PO TABS
ORAL_TABLET | ORAL | Status: AC
  Filled 2020-05-10: qty 2

## 2020-05-10 MED ORDER — SODIUM CHLORIDE 0.9 % IV SOLN
Freq: Once | INTRAVENOUS | Status: AC
  Filled 2020-05-10: qty 250

## 2020-05-10 MED ORDER — ACETAMINOPHEN 325 MG PO TABS
650.0000 mg | ORAL_TABLET | Freq: Once | ORAL | Status: AC
  Administered 2020-05-10: 650 mg via ORAL

## 2020-05-10 MED ORDER — SODIUM CHLORIDE 0.9% IV SOLUTION
250.0000 mL | Freq: Once | INTRAVENOUS | Status: AC
  Administered 2020-05-10: 250 mL via INTRAVENOUS
  Filled 2020-05-10: qty 250

## 2020-05-10 MED ORDER — DIPHENHYDRAMINE HCL 25 MG PO CAPS
25.0000 mg | ORAL_CAPSULE | Freq: Once | ORAL | Status: AC
  Administered 2020-05-10: 25 mg via ORAL

## 2020-05-10 MED ORDER — POTASSIUM CHLORIDE 20 MEQ/100ML IV SOLN: 20.0000 meq | Freq: Once | INTRAVENOUS | Status: DC

## 2020-05-10 MED ORDER — SODIUM CHLORIDE 0.9 % IV SOLN
Freq: Once | INTRAVENOUS | Status: DC
  Filled 2020-05-10: qty 250

## 2020-05-10 MED ORDER — SODIUM CHLORIDE 0.9 % IV SOLN
20.0000 meq | Freq: Once | INTRAVENOUS | Status: AC
  Administered 2020-05-10: 20 meq via INTRAVENOUS
  Filled 2020-05-10: qty 10

## 2020-05-10 NOTE — Telephone Encounter (Signed)
Ms cathy from the labs called and reports the patient has a crital lab for potassium 2.7

## 2020-05-11 ENCOUNTER — Inpatient Hospital Stay: Payer: Medicare Other

## 2020-05-11 VITALS — BP 111/60 | HR 80 | Temp 96.0°F | Resp 18

## 2020-05-11 DIAGNOSIS — Z5111 Encounter for antineoplastic chemotherapy: Secondary | ICD-10-CM | POA: Diagnosis not present

## 2020-05-11 DIAGNOSIS — D72819 Decreased white blood cell count, unspecified: Secondary | ICD-10-CM | POA: Diagnosis not present

## 2020-05-11 DIAGNOSIS — K5090 Crohn's disease, unspecified, without complications: Secondary | ICD-10-CM | POA: Diagnosis not present

## 2020-05-11 DIAGNOSIS — T451X5A Adverse effect of antineoplastic and immunosuppressive drugs, initial encounter: Secondary | ICD-10-CM | POA: Diagnosis not present

## 2020-05-11 DIAGNOSIS — N400 Enlarged prostate without lower urinary tract symptoms: Secondary | ICD-10-CM | POA: Diagnosis not present

## 2020-05-11 DIAGNOSIS — D649 Anemia, unspecified: Secondary | ICD-10-CM | POA: Diagnosis not present

## 2020-05-11 DIAGNOSIS — E876 Hypokalemia: Secondary | ICD-10-CM

## 2020-05-11 DIAGNOSIS — M109 Gout, unspecified: Secondary | ICD-10-CM | POA: Diagnosis not present

## 2020-05-11 DIAGNOSIS — I4510 Unspecified right bundle-branch block: Secondary | ICD-10-CM | POA: Diagnosis not present

## 2020-05-11 DIAGNOSIS — Z148 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: Secondary | ICD-10-CM

## 2020-05-11 DIAGNOSIS — C9200 Acute myeloblastic leukemia, not having achieved remission: Secondary | ICD-10-CM

## 2020-05-11 DIAGNOSIS — C7951 Secondary malignant neoplasm of bone: Secondary | ICD-10-CM | POA: Diagnosis not present

## 2020-05-11 DIAGNOSIS — D701 Agranulocytosis secondary to cancer chemotherapy: Secondary | ICD-10-CM

## 2020-05-11 LAB — BASIC METABOLIC PANEL
Anion gap: 11 (ref 5–15)
BUN: 51 mg/dL — ABNORMAL HIGH (ref 8–23)
CO2: 27 mmol/L (ref 22–32)
Calcium: 8 mg/dL — ABNORMAL LOW (ref 8.9–10.3)
Chloride: 97 mmol/L — ABNORMAL LOW (ref 98–111)
Creatinine, Ser: 2.02 mg/dL — ABNORMAL HIGH (ref 0.61–1.24)
GFR calc Af Amer: 37 mL/min — ABNORMAL LOW (ref >60)
GFR calc non Af Amer: 32 mL/min — ABNORMAL LOW (ref >60)
Glucose, Bld: 109 mg/dL — ABNORMAL HIGH (ref 70–99)
Potassium: 2.7 mmol/L — CL (ref 3.5–5.1)
Sodium: 135 mmol/L (ref 135–145)

## 2020-05-11 MED ORDER — POTASSIUM CHLORIDE CRYS ER 20 MEQ PO TBCR: 20.0000 meq | EXTENDED_RELEASE_TABLET | Freq: Every day | ORAL | 0 refills | Status: DC

## 2020-05-11 MED ORDER — ACETAMINOPHEN 325 MG PO TABS
650.0000 mg | ORAL_TABLET | Freq: Once | ORAL | Status: AC
  Administered 2020-05-11: 650 mg via ORAL
  Filled 2020-05-11: qty 2

## 2020-05-11 MED ORDER — SODIUM CHLORIDE 0.9% IV SOLUTION
250.0000 mL | Freq: Once | INTRAVENOUS | Status: AC
  Administered 2020-05-11: 250 mL via INTRAVENOUS
  Filled 2020-05-11: qty 250

## 2020-05-11 MED ORDER — DIPHENHYDRAMINE HCL 25 MG PO CAPS
25.0000 mg | ORAL_CAPSULE | Freq: Once | ORAL | Status: AC
  Administered 2020-05-11: 25 mg via ORAL
  Filled 2020-05-11: qty 1

## 2020-05-11 MED ORDER — POTASSIUM CHLORIDE CRYS ER 10 MEQ PO TBCR
20.0000 meq | EXTENDED_RELEASE_TABLET | Freq: Once | ORAL | Status: AC
  Administered 2020-05-11: 20 meq via ORAL
  Filled 2020-05-11: qty 2

## 2020-05-11 MED ORDER — POTASSIUM CHLORIDE ER 10 MEQ PO TBCR: EXTENDED_RELEASE_TABLET | ORAL | 0 refills | Status: DC

## 2020-05-12 DIAGNOSIS — E876 Hypokalemia: Secondary | ICD-10-CM | POA: Insufficient documentation

## 2020-05-12 LAB — BPAM RBC
Blood Product Expiration Date: 202108312359
Blood Product Expiration Date: 202109102359
ISSUE DATE / TIME: 202108191451
ISSUE DATE / TIME: 202108200913
Unit Type and Rh: 5100
Unit Type and Rh: 9500

## 2020-05-12 LAB — TYPE AND SCREEN
ABO/RH(D): O POS
Antibody Screen: NEGATIVE
Unit division: 0
Unit division: 0

## 2020-05-14 ENCOUNTER — Ambulatory Visit: Admit: 2020-05-14 | Discharge: 2020-05-15 | Payer: MEDICARE

## 2020-05-14 ENCOUNTER — Ambulatory Visit: Admit: 2020-05-14 | Discharge: 2020-05-15 | Payer: MEDICARE | Attending: Adult Health | Primary: Adult Health

## 2020-05-14 ENCOUNTER — Other Ambulatory Visit: Admit: 2020-05-14 | Discharge: 2020-05-15 | Payer: MEDICARE

## 2020-05-14 ENCOUNTER — Inpatient Hospital Stay: Payer: Medicare Other | Admitting: Family Medicine

## 2020-05-14 DIAGNOSIS — R600 Localized edema: Secondary | ICD-10-CM | POA: Diagnosis not present

## 2020-05-14 DIAGNOSIS — Z87891 Personal history of nicotine dependence: Secondary | ICD-10-CM | POA: Diagnosis not present

## 2020-05-14 DIAGNOSIS — Z79899 Other long term (current) drug therapy: Secondary | ICD-10-CM | POA: Diagnosis not present

## 2020-05-14 DIAGNOSIS — D471 Chronic myeloproliferative disease: Secondary | ICD-10-CM | POA: Diagnosis not present

## 2020-05-14 DIAGNOSIS — E611 Iron deficiency: Secondary | ICD-10-CM | POA: Diagnosis not present

## 2020-05-14 DIAGNOSIS — C9200 Acute myeloblastic leukemia, not having achieved remission: Secondary | ICD-10-CM | POA: Diagnosis not present

## 2020-05-14 DIAGNOSIS — D45 Polycythemia vera: Secondary | ICD-10-CM | POA: Diagnosis not present

## 2020-05-14 DIAGNOSIS — N183 Chronic kidney disease, stage 3 unspecified: Secondary | ICD-10-CM | POA: Diagnosis not present

## 2020-05-14 DIAGNOSIS — K5090 Crohn's disease, unspecified, without complications: Secondary | ICD-10-CM | POA: Diagnosis not present

## 2020-05-14 DIAGNOSIS — R630 Anorexia: Secondary | ICD-10-CM | POA: Diagnosis not present

## 2020-05-14 LAB — CBC W/ AUTO DIFF
BASOPHILS ABSOLUTE COUNT: 0 10*9/L (ref 0.0–0.1)
BASOPHILS RELATIVE PERCENT: 1.3 %
EOSINOPHILS ABSOLUTE COUNT: 0 10*9/L (ref 0.0–0.4)
EOSINOPHILS RELATIVE PERCENT: 0.1 %
HEMATOCRIT: 19.3 % — ABNORMAL LOW (ref 41.0–53.0)
HEMOGLOBIN: 6.4 g/dL — ABNORMAL LOW (ref 13.5–17.5)
LARGE UNSTAINED CELLS: 13 % — ABNORMAL HIGH (ref 0–4)
LYMPHOCYTES ABSOLUTE COUNT: 1 10*9/L — ABNORMAL LOW (ref 1.5–5.0)
LYMPHOCYTES RELATIVE PERCENT: 42.5 %
MEAN CORPUSCULAR HEMOGLOBIN CONC: 33.2 g/dL (ref 31.0–37.0)
MEAN CORPUSCULAR HEMOGLOBIN: 28.2 pg (ref 26.0–34.0)
MEAN CORPUSCULAR VOLUME: 84.9 fL (ref 80.0–100.0)
MEAN PLATELET VOLUME: 10.9 fL — ABNORMAL HIGH (ref 7.0–10.0)
MONOCYTES ABSOLUTE COUNT: 0.1 10*9/L — ABNORMAL LOW (ref 0.2–0.8)
MONOCYTES RELATIVE PERCENT: 4.4 %
NEUTROPHILS ABSOLUTE COUNT: 0.9 10*9/L — ABNORMAL LOW (ref 2.0–7.5)
NEUTROPHILS RELATIVE PERCENT: 38.7 %
PLATELET COUNT: 359 10*9/L (ref 150–440)
RED BLOOD CELL COUNT: 2.28 10*12/L — ABNORMAL LOW (ref 4.50–5.90)
RED CELL DISTRIBUTION WIDTH: 18.4 % — ABNORMAL HIGH (ref 12.0–15.0)
WBC ADJUSTED: 2.3 10*9/L — ABNORMAL LOW (ref 4.5–11.0)

## 2020-05-14 LAB — COMPREHENSIVE METABOLIC PANEL
ALBUMIN: 2.5 g/dL — ABNORMAL LOW (ref 3.4–5.0)
ALKALINE PHOSPHATASE: 91 U/L (ref 46–116)
ALT (SGPT): 8 U/L — ABNORMAL LOW (ref 10–49)
ANION GAP: 8 mmol/L (ref 5–14)
AST (SGOT): 31 U/L (ref <=34)
BILIRUBIN TOTAL: 1.2 mg/dL (ref 0.3–1.2)
BLOOD UREA NITROGEN: 37 mg/dL — ABNORMAL HIGH (ref 9–23)
BUN / CREAT RATIO: 23
CALCIUM: 8.5 mg/dL — ABNORMAL LOW (ref 8.7–10.4)
CHLORIDE: 98 mmol/L (ref 98–107)
CO2: 24 mmol/L (ref 20.0–31.0)
CREATININE: 1.64 mg/dL — ABNORMAL HIGH
EGFR CKD-EPI AA MALE: 47 mL/min/{1.73_m2} — ABNORMAL LOW (ref >=60)
EGFR CKD-EPI NON-AA MALE: 41 mL/min/{1.73_m2} — ABNORMAL LOW (ref >=60)
GLUCOSE RANDOM: 96 mg/dL (ref 70–179)
POTASSIUM: 3.4 mmol/L (ref 3.4–4.5)
PROTEIN TOTAL: 8.8 g/dL — ABNORMAL HIGH (ref 5.7–8.2)
SODIUM: 130 mmol/L — ABNORMAL LOW (ref 135–145)

## 2020-05-14 LAB — SLIDE REVIEW

## 2020-05-14 NOTE — Unmapped (Addendum)
Malignant Hematology Established Visit      Patient: Howard Villarreal  MRN: [id number]  DOB: [birth year]  Date of Visit: 05/14/2020    Local Oncologist: Dr. Nelva Nay    Reason for Visit   Howard Villarreal is a 74 y.o. 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 Hommerding is doing fair. He is fatigued but otherwise relatively asymptomatic.  We reviewed his BMBx today, which showed persistent disease, however, about a 50% reduction in blasts (20% -> 5-10%).  We will move forward with a second cycle with hopes that this will put him in a remission.  Today platelets are 358, ANC 0.9.  This has come up from 0.2 last week.  Plan to continue prophylaxis.    Today will schedule BMBx for end of cycle 2, plus clinic visit a week later.  Would like to begin Cycle 2 ASAP, and patient would like to recieve this locally with Dr. Merlene Pulling.  Outlined plan of care below.  We will continue to see him monthly and supply his venetoclax.    He is still having significant BLE after hospital stay.  Script was given for compression stockings.  Stopped taking lasix over the weekend - K is back to normal.      Addendum: Per Dr. Anise Salvo, no port for access.  SL or DL PICC are okay.    Plan   1. Start Cycle 2 Aza/Ven   Azacitidine 75mg /m2 SQ x 7 days   Venetoclax 100mg  PO days 1-28 (dose reduced for DDI with posaconazole)  2. PPX    posaconazole 300mg  PO daily   Levofloxacin 260mg  po daily (DR for CKD)   Valacyclovir 500mg  PO daily  3. Transfusion requirements:   Labs with transfusions 2-3 times per week   If bleeding, try to keep platelets >30k  4. Tranexamic acid 1300mg  TID if bleeding and platelets <30k  5. BMBx 9/22  6. Follow up here 9/27    [name], RN, MSN, AGPCNP-C  Nurse Practitioner  Hematologic Malignancies  [facility]  [phone] (phone)  [phone] (fax)  [email or web address]    I personally spent 60 minutes face-to-face and non-face-to-face in the care of this patient, which includes all pre, intra, and post visit time on the date of service.    Interval History:  Doing fair.  Was admitted to [facility] for neutropenic fever from 8/4-8/9.  Has been following up with Dr. Merlene Pulling at [facility].  Has been getting transfusions there.  Still fatigued.  He was put on lasix for edema but became hypokalemic and got IV K+ at [facility] as well.  Appetite is relatively poor, but is still eating.  Still has significant BLE edema.  Venetoclax has been on hold for about 2 weeks ([facility] held d/t neutropenia).    Otherwise, denies new bone pain, fevers, chills, night sweats, lumps/bumps, tongue swelling, shortness of breath, syncope, lightheadedness, constipation or diarrhea, nausea or vomiting, very easy bruising or bleeding, or urinary changes.       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 He has had polycythemia since at  least 2013 (no labs prior to that, but on 08/24/12, WBC 21, Hgb 19.3, Hct 59.8%, Plts 551). Hematocrit was 62.1 with a hemoglobin of 19.8 on 03/28/2015. 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 Erythropoietin level was 1.1 (low).     He began a phlebotomy program on 03/28/2015 when he was seen in consultation by Dr. Merlene Pulling and diagnosed with PV to maintain a hematocrit goal of < 45%; he was likely iron deficient at diagnosis, with MCV 42fL.   He last underwent phlebotomy on 05/02/2015. He is maintained on aspirin 81mg  po daily without significant bleeding nor dyspepsia.     He is followed closely by GI (Dr. Mechele Collin) for a history of polyps and Crohn's disease. Last colonoscopy was 2012. He has become iron deficient on his phlebotomy program (ferritin 21 on 11/28/2015). CBC on 11/28/2015 revealed a hematocrit of 39.3, hemoglobin 11.9, platelets 463,000, WBC 32,000 with an ANC of 25,000. Differential included 71% segs, 4% lymphocytes and 17% monocytes. Peripheral smear revealed leukocytosis with predominantly mature neutrophils, increased monocytes and rare blasts (<1%).    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 Marrow was packed (95-100% cellularity) with pan myelosis, multi-lineage dyspoiesis, and no significant increase in blasts. There was moderate to focally marked reticulin fibrosis (grade 2-3/3). Storage iron was not identified. Flow cytometry revealed non-specific atypical myeloid findings with no increase in blasts. 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 Cytogenetics were normal (46, XY).    He was briefly on hydroxyurea 500 mg a day (12/05/2015 - 12/19/2015). He began allopurinol on 12/19/2015. Uric acid has decreased from 12 to 5.8 to 5.1. Creatinine is stable at 1.62 (CrCl 42 ml/min). LDH was 466 on 01/02/2016. Hematocrit is drifting down (36.9 to 35.4). Platelets are 228,000 and WBC of 21,700.    [facility] review of the bmbx:  Morphologic evaluation, together with review of outside reports of flow cytometry, cytogenetics, and our review of outside special stains, support the above interpretation. Morphologic evaluation of the aspirate smear may be limited by suboptimal/weak staining. While the aspirate differential count does not reflect an increase in blasts, there are scattered immature appearing mononuclear cells that may represent hypogranular and/or maturing granulocytes. Megakaryocytes are dysplastic, showing numerous hypo-loabted and multi-nucleated forms as would be seen in myelodysplastic syndrome, with relatively infrequent hyper-lobated forms. Erythroid precursors show at least mild dysplasia. Granulocytic precursors may show hypogranularity. The bone marrow biopsy demonstrates a hypercellular marrow with increased reticulin fibrosis. Overall, while it is difficult to exclude a post-polycythemic myelofibrosis with progression to dysplasia given the patient's history, an overlap myeloproliferative/myedysplastic neoplasm would also be in the differential. In addition, flow cytometric, CD34 immunohistochemistry on the biopsy and marrow aspirate differential show no definitive increase in blasts, the possible suboptimal staining of the aspirate smear and/or true hypogranularity of the granulocytes may lead to an underestimate of these forms. Repeat marrow evaluation and correlation with myeloid mutation panel (MDS/MPN) is recommended.??    His bone marrow biopsy from 04/07/16 showed a >95% cellular marrow with 5% blasts by manual aspirate differential, mild reticulin fibrosis (but not enough to classify as MF), mild dyserythropoiesis, hypogranular neutrophils (but not enough to classify as MDS) and molecular mutations in: JAK2 V617F, IDH2, RUNX1, SRSF2.  Mutations in RUNX1 and SRSF2 are associated with poorer prognosis and shorter survival in MPN.  However, median overall survival with this is still reasonably long, estimated at  10 years assuming one still classifies this as PV, which technically it is  [[email or web address]].  I do think what we are seeing is PV evolving to MF (especially given the prior outside bmbx that showed moderated to marked fibrotic changes, but only minimal on our sampling plus the presence of nucleated RBCs in the periphery and elevated LDH.    Assuming this were MF, he would be classified as Intermediate-2 risk by DIPPS and the same by DIPPS Plus, noting that he does have a higher-risk mutational profile with RUNX1 and SRSF2 (though only ASXL1 remained significant in the follow-up analyses of molecular markers).      Review of Systems     Wt Readings from Last 12 Encounters:   05/08/20 71.7 kg (158 lb 1.6 oz)   05/08/20 71.1 kg (156 lb 12 oz)   04/23/20 72.2 kg (159 lb 1.6 oz)   04/23/20 71.5 kg (157 lb 10.1 oz)   04/20/20 69.6 kg (153 lb 5.3 oz)   04/18/20 68.8 kg (151 lb 9.6 oz)   04/16/20 68.6 kg (151 lb 3.2 oz)   04/11/20 67.5 kg (148 lb 12.8 oz)   04/09/20 67 kg (147 lb 11.3 oz)   04/07/20 67.7 kg (149 lb 2.3 oz)   04/05/20 71 kg (156 lb 9.6 oz)   04/03/20 73.1 kg (161 lb 1.6 oz)     10 systems reviewed and negative except as noted in the HPI and Interval History.     Medications     Current Outpatient Medications   Medication Sig Dispense Refill   ??? acetaminophen (TYLENOL) 500 MG tablet Take 500 mg by mouth every eight (8) hours as needed.     ??? allopurinoL (ZYLOPRIM) 300 MG tablet Take 150 mg by mouth daily.      ??? cyanocobalamin, vitamin B-12, (VITAMIN B-12) 1000 MCG tablet Take 1,000 mcg by mouth daily.     ??? finasteride (PROSCAR) 5 mg tablet Take 5 mg by mouth daily with evening meal.     ??? folic acid (FOLVITE) 1 MG tablet Take 1 mg by mouth daily.     ??? latanoprost (XALATAN) 0.005 % ophthalmic solution 1 drop nightly.     ??? levoFLOXacin (LEVAQUIN) 250 MG tablet Take 1 tablet (250 mg total) by mouth daily. (Patient not taking: Reported on 04/23/2020) 30 tablet 1   ??? levoFLOXacin (LEVAQUIN) 500 MG tablet TAKE 1/2 TABLET BY MOUTH EVERY DAY     ??? posaconazole (NOXAFIL) 100 mg TbEC delayed released tablet Take 3 tablets (300 mg) by mouth daily. 90 tablet 1   ??? sodium bicarbonate 650 mg tablet Take 650 mg by mouth Two (2) times a day.     ??? timolol (TIMOPTIC) 0.5 % ophthalmic solution Apply 1 drop to eye daily.      ??? tranexamic acid 650 mg Tab tablet      ??? valACYclovir (VALTREX) 500 MG tablet Take 1 tablet (500 mg total) by mouth daily. 30 tablet 11   ??? venetoclax (VENCLEXTA) 100 mg tablet Take 1 tablet (100 mg total) by mouth daily. Take with a meal and water. Do not chew, crush, or break tablets. 30 tablet 1     No current facility-administered medications for this visit.         Allergies   No Known Allergies    Past Medical and Surgical History     Past Medical History:   Diagnosis Date   ??? 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  CKD (chronic kidney disease) stage 3, GFR 30-59 ml/min    ??? Crohn's colitis (CMS-HCC) 1986    No surgeries. Was on 6-MP for awhile; hasn't been on anything 2005.   ??? Glaucoma    ??? 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      Past Surgical History:   Procedure Laterality Date   ??? MOHS SURGERY  2016    left ear for non-melanoma skin cancer       Social History     Social History     Socioeconomic History   ??? Marital status: Married     Spouse name: Not on file   ??? Number of children: Not on file   ??? Years of education: Not on file   ??? Highest education level: Not on file   Occupational History   ??? Not on file   Tobacco Use   ??? Smoking status: Former Smoker   ??? Smokeless tobacco: Never Used   Vaping Use   ??? Vaping Use: Never used   Substance and Sexual Activity   ??? Alcohol use: Yes     Alcohol/week: 4.0 standard drinks     Types: 4 Glasses of wine per week     Comment: occasional    ??? Drug use: Not on file   ??? Sexual activity: Not on file   Other Topics Concern   ??? Not on file   Social History Narrative   ??? Not on file     Social Determinants of Health     Financial Resource Strain: Low Risk    ??? Difficulty of Paying Living Expenses: Not hard at all   Food Insecurity: No Food Insecurity   ??? Worried About Programme researcher, broadcasting/film/video in the Last Year: Never true   ??? Ran Out of Food in the Last Year: Never true   Transportation Needs: No Transportation Needs   ??? Lack of Transportation (Medical): No   ??? Lack of Transportation (Non-Medical): No   Physical Activity:    ??? Days of Exercise per Week:    ??? Minutes of Exercise per Session:    Stress:    ??? Feeling of Stress :    Social Connections:    ??? Frequency of Communication with Friends and Family:    ??? Frequency of Social Gatherings with Friends and Family:    ??? Attends Religious Services:    ??? Database administrator or Organizations:    ??? Attends Banker Meetings:    ??? Marital Status:    Former Audiological scientist; retired several years ago.  Lives with his wife.        Physical Examination     There were no vitals filed for this visit.  GENERAL:   Accompanied by his wife.  In no distress.  Pleasant and alert.   RESP: Breathing comfortably.  Speaking comfortably in full sentences.   During the 60 minute visit, 2 episodes of ~3 seconds of non-productive cough.    NEURO: A&Ox4.  CN II-XII grossly intact and symmetric.   PSYCH: Normal affect, mood is so so.  Forward thinking.  Goal-directed and linear.   EXT:  1+ pedal edema bilaterally.   SKIN:  Some bruising on arms, but no petechiae, no rashes.   .     Laboratory Testing and Imaging     Results for orders placed or performed during the hospital encounter of 05/08/20   Prepare RBC   Result  Value Ref Range    Crossmatch Compatible     Unit Blood Type O Pos     ISBT Number 5100     Unit # [id number]     Status Transfused     Product ID Red Blood Cells     PRODUCT CODE [id number]     Crossmatch Compatible     Unit Blood Type O Pos     ISBT Number 5100     Unit # [id number]     Status Transfused     Product ID Red Blood Cells     PRODUCT CODE [id number]

## 2020-05-14 NOTE — Unmapped (Signed)
Your bone marrow biopsy shows improvement but not remission.  We will do another cycle and then plan to do a bone marrow biopsy to evaluate.      You will start your next cycle at [facility] but still see [place] monthly.  You will continue to get your venetoclax through [place].    You should get compression stockings to help lessen the swelling.    We will see you again about a week after your bone marrow biopsy.    If you have any questions, please do not hesitate to contact us.    If you experience new or worsening of the following symptoms, please call:  1. Night sweats  2. Abdominal discomfort in the left upper quadrant  3. Feeling full early  4. Excessive fatigue  5. Unintentional weight loss  6. Bone pain  7. Itching after hot showers    When reviewing your results, please remember that the results of many of the tests we order can vary somewhat and that variation often means nothing.  Sometimes when we get results back after your clinic visit, if it looks like there???s some variation of that type, we may decide to recheck things sooner than we discussed in clinic.  If you get a call that we want to recheck things sooner, do not panic. It does not mean that things are going wrong.    For appointments & questions Monday through Friday 8 AM??? 5 PM   please call [phone] or Toll free [phone].    On Nights, Weekends and Holidays  Call [phone] and ask for the adult hematologist/oncologist on call.    [name], AGPCNP-C, MSN, OCN  Nurse Practitioner  Hematologic Malignancies  [facility]    Nurse Navigator: Wynona Meals RN  Questions and appointments M-F 8am - 5pm: [phone] or [phone]    [facility]  [address]  [place], [place] [address]  [email or web address]    Results for orders placed or performed in visit on 05/14/20   Comprehensive Metabolic Panel   Result Value Ref Range    Sodium 130 (L) 135 - 145 mmol/L    Potassium 3.4 3.4 - 4.5 mmol/L    Chloride 98 98 - 107 mmol/L    Anion Gap 8 5 - 14 mmol/L    CO2 24.0 20.0 - 31.0 mmol/L    BUN 37 (H) 9 - 23 mg/dL    Creatinine 3.66 (H) 0.60 - 1.10 mg/dL    BUN/Creatinine Ratio 23     EGFR CKD-EPI Non-African American, Male 41 (L) >=60 mL/min/1.25m2    EGFR CKD-EPI African American, Male 47 (L) >=60 mL/min/1.70m2    Glucose 96 70 - 179 mg/dL    Calcium 8.5 (L) 8.7 - 10.4 mg/dL    Albumin 2.5 (L) 3.4 - 5.0 g/dL    Total Protein 8.8 (H) 5.7 - 8.2 g/dL    Total Bilirubin 1.2 0.3 - 1.2 mg/dL    AST 31 <=44 U/L    ALT 8 (L) 10 - 49 U/L    Alkaline Phosphatase 91 46 - 116 U/L   CBC w/ Differential   Result Value Ref Range    WBC 2.3 (L) 4.5 - 11.0 10*9/L    RBC 2.28 (L) 4.50 - 5.90 10*12/L    HGB 6.4 (L) 13.5 - 17.5 g/dL    HCT 03.4 (L) 74.2 - 53.0 %    MCV 84.9 80.0 - 100.0 fL    MCH 28.2 26.0 - 34.0 pg  MCHC 33.2 31.0 - 37.0 g/dL    RDW 16.1 (H) 09.6 - 15.0 %    MPV 10.9 (H) 7.0 - 10.0 fL    Platelet 359 150 - 440 10*9/L    Variable HGB Concentration Slight (A) Not Present    Neutrophils % 38.7 %    Lymphocytes % 42.5 %    Monocytes % 4.4 %    Eosinophils % 0.1 %    Basophils % 1.3 %    Neutrophil Left Shift 2+ (A) Not Present    Absolute Neutrophils 0.9 (L) 2.0 - 7.5 10*9/L    Absolute Lymphocytes 1.0 (L) 1.5 - 5.0 10*9/L    Absolute Monocytes 0.1 (L) 0.2 - 0.8 10*9/L    Absolute Eosinophils 0.0 0.0 - 0.4 10*9/L    Absolute Basophils 0.0 0.0 - 0.1 10*9/L    Large Unstained Cells 13 (H) 0 - 4 %    Microcytosis Slight (A) Not Present    Anisocytosis Moderate (A) Not Present    Hypochromasia Slight (A) Not Present   Morphology Review   Result Value Ref Range    Smear Review Comments See Comment (A) Undefined    Giant Platelets Present (A) Not Present

## 2020-05-14 NOTE — Unmapped (Signed)
Pt to clinic for lab check.  Hgb 6.4 today, pt will need x2 units of PRBC's.  All blood products completed at 1444, pt tolerated well.  Pt left ambulatory with spouse.

## 2020-05-14 NOTE — Unmapped (Signed)
PIV placed.  Labs drawn & sent for analysis. To next appt.  Care provided by Sallyanne Kuster RN.

## 2020-05-14 NOTE — Unmapped (Signed)
Patient Education        Learning About Blood Transfusions  What is a blood transfusion?     Blood transfusion is a medical treatment to replace the blood or parts of blood that your body has lost. The blood goes through a tube from a bag to an intravenous (IV) catheter and into your vein.  You may need a blood transfusion after losing blood from an injury, a major surgery, an illness that causes bleeding, or an illness that destroys blood cells.  Transfusions are also used to give you the parts of blood???such as platelets, plasma, or substances that cause clotting???that your body needs to fight an illness or stop bleeding.  How is a blood transfusion done?  Before you receive a blood transfusion, your blood is tested to find out what your blood type is. Blood or blood parts that are a match with your blood type are ordered by your doctor. Blood is typed as A, B, AB, or O. It is also typed as Rh-positive or Rh-negative.  Your blood is also screened to look for antibodies that might react with the blood that is given to you. The blood you are getting is checked and rechecked to make sure that it's the right type for you.  A sample of your blood is mixed with a sample of the blood you will receive to check for problems. Before actually giving you the transfusion, a doctor and nurses will look at the label on the package of blood and compare it to your hospital ID bracelet and medical records. The transfusion begins only when all agree that this is the correct blood and that you are the correct person to receive it.  To receive the transfusion, you will have an intravenous (IV) catheter inserted into a vein. A tube connects the catheter to the bag containing the blood, which is placed higher than your body. The blood then flows slowly into your vein. A doctor or nurse will check you several times during the transfusion to watch for a reaction or other problems.  What are the possible risks?  Blood transfusions have many benefits and are often life-saving. But they also have a few risks. Possible risks include:  ?? Your body's reaction to receiving new blood. This may include:  ? Fever.  ? Breathing problems.  ? Allergic reaction, such as hives, swelling, or a new rash.  ?? An infection from the blood. This risk is small because of the strict rules placed on handling and storing blood. Getting a viral infection, such as HIV or hepatitis B or C, through blood transfusions has become very rare. The U[place] Food and Drug Administration (FDA) enforces strict guidelines on the collection, testing, storage, and use of blood.  ?? Getting the wrong blood type by accident. Severe reactions, which can be life-threatening, are very rare.  ?? An infection at the transfusion site, such as redness, swelling, pain, bleeding, or pus.  How can you care for yourself at home?  To prevent infection at the transfusion site  ?? Wash the area daily with warm, soapy water, and pat it dry. Don't use hydrogen peroxide or alcohol, which can slow healing. You may cover the area with a gauze bandage if it weeps or rubs against clothing. Change the bandage every day.  ?? Keep the area clean and dry.  When should you call for help?  Call 911 anytime you think you may need emergency care. For example, call if:  ??  You have severe trouble breathing.  Call your doctor now or seek immediate medical care if:  ?? You have signs of an allergic reaction, such as hives, swelling, or a new rash.  ?? You have a fever.  ?? You feel weaker or more tired than usual.  ?? You have a yellow tint to your skin or the whites of your eyes.  ?? You have signs of an infection at the transfusion site, such as redness, swelling, pain, bleeding, or pus.  Watch closely for changes in your health, and be sure to contact your doctor if you have any problems.  Follow-up care is a key part of your treatment and safety. Be sure to make and go to all appointments, and call your doctor if you are having problems. It's also a good idea to know your test results and keep a list of the medicines you take.  Where can you learn more?  Go to [facility] at [email or web address]  Select Patient Education under [employer]. Enter V588 in the search box to learn more about Learning About Blood Transfusions.  Current as of: June 15, 2019??????????????????????????????Content Version: 12.9  ?? 2006-2021 [employer].   Care instructions adapted under license by [facility]. If you have questions about a medical condition or this instruction, always ask your healthcare professional. [employer] disclaims any warranty or liability for your use of this information.  Hospital Outpatient Visit on 05/14/2020   Component Date Value Ref Range Status   ??? Crossmatch 05/14/2020 Compatible   Final   ??? Unit Blood Type 05/14/2020 O Pos   Final   ??? ISBT Number 05/14/2020 [id number]   Final   ??? Unit # 05/14/2020 [id number]   Final   ??? Status 05/14/2020 Ready   Final   ??? Spec Expiration 05/14/2020 [id number]   Final   ??? Product ID 05/14/2020 Red Blood Cells   Final   ??? PRODUCT CODE 05/14/2020 [id number]   Final   ??? Crossmatch 05/14/2020 Compatible   Final   ??? Unit Blood Type 05/14/2020 O Pos   Final   ??? ISBT Number 05/14/2020 [id number]   Final   ??? Unit # 05/14/2020 [id number]   Final   ??? Status 05/14/2020 Ready   Final   ??? Spec Expiration 05/14/2020 [id number]   Final   ??? Product ID 05/14/2020 Red Blood Cells   Final   ??? PRODUCT CODE 05/14/2020 [id number]   Final   Lab on 05/14/2020   Component Date Value Ref Range Status   ??? ABO Grouping 05/14/2020 O POS   Final   ??? Antibody Screen 05/14/2020 NEG   Final   ??? Sodium 05/14/2020 130* 135 - 145 mmol/L Final   ??? Potassium 05/14/2020 3.4  3.4 - 4.5 mmol/L Final   ??? Chloride 05/14/2020 98  98 - 107 mmol/L Final   ??? Anion Gap 05/14/2020 8  5 - 14 mmol/L Final   ??? CO2 05/14/2020 24.0  20.0 - 31.0 mmol/L Final   ??? BUN 05/14/2020 37* 9 - 23 mg/dL Final   ??? Creatinine 05/14/2020 1.64* 0.60 - 1.10 mg/dL Final   ??? BUN/Creatinine Ratio 05/14/2020 23   Final   ??? EGFR CKD-EPI Non-African American,* 05/14/2020 41* >=60 mL/min/1.57m2 Final   ??? EGFR CKD-EPI African American, Male 05/14/2020 47* >=60 mL/min/1.51m2 Final   ??? Glucose 05/14/2020 96  70 - 179 mg/dL Final   ??? Calcium 30/16/0109 8.5* 8.7 - 10.4 mg/dL Final   ??? Albumin 32/35/5732 2.5* 3.4 -  5.0 g/dL Final   ??? Total Protein 05/14/2020 8.8* 5.7 - 8.2 g/dL Final   ??? Total Bilirubin 05/14/2020 1.2  0.3 - 1.2 mg/dL Final   ??? AST 16/06/9603 31  <=34 U/L Final   ??? ALT 05/14/2020 8* 10 - 49 U/L Final   ??? Alkaline Phosphatase 05/14/2020 91  46 - 116 U/L Final   ??? WBC 05/14/2020 2.3* 4.5 - 11.0 10*9/L Final   ??? RBC 05/14/2020 2.28* 4.50 - 5.90 10*12/L Final   ??? HGB 05/14/2020 6.4* 13.5 - 17.5 g/dL Final   ??? HCT 54/05/8118 19.3* 41.0 - 53.0 % Final   ??? MCV 05/14/2020 84.9  80.0 - 100.0 fL Final   ??? MCH 05/14/2020 28.2  26.0 - 34.0 pg Final   ??? MCHC 05/14/2020 33.2  31.0 - 37.0 g/dL Final   ??? RDW 14/78/2956 18.4* 12.0 - 15.0 % Final   ??? MPV 05/14/2020 10.9* 7.0 - 10.0 fL Final   ??? Platelet 05/14/2020 359  150 - 440 10*9/L Final   ??? Variable HGB Concentration 05/14/2020 Slight* Not Present Final   ??? Neutrophils % 05/14/2020 38.7  % Final   ??? Lymphocytes % 05/14/2020 42.5  % Final   ??? Monocytes % 05/14/2020 4.4  % Final   ??? Eosinophils % 05/14/2020 0.1  % Final   ??? Basophils % 05/14/2020 1.3  % Final   ??? Neutrophil Left Shift 05/14/2020 2+* Not Present Final   ??? Absolute Neutrophils 05/14/2020 0.9* 2.0 - 7.5 10*9/L Final   ??? Absolute Lymphocytes 05/14/2020 1.0* 1.5 - 5.0 10*9/L Final   ??? Absolute Monocytes 05/14/2020 0.1* 0.2 - 0.8 10*9/L Final   ??? Absolute Eosinophils 05/14/2020 0.0  0.0 - 0.4 10*9/L Final   ??? Absolute Basophils 05/14/2020 0.0  0.0 - 0.1 10*9/L Final   ??? Large Unstained Cells 05/14/2020 13* 0 - 4 % Final    Blasts Present.    ??? Microcytosis 05/14/2020 Slight* Not Present Final   ??? Anisocytosis 05/14/2020 Moderate* Not Present Final   ??? Hypochromasia 05/14/2020 Slight* Not Present Final   ??? Smear Review Comments 05/14/2020 See Comment* Undefined Final    Slide Reviewed.  Blast cells present.  Promyelocytes present - rare.  Myelocytes present - rare.   ??? Giant Platelets 05/14/2020 Present* Not Present Final

## 2020-05-15 ENCOUNTER — Inpatient Hospital Stay (HOSPITAL_BASED_OUTPATIENT_CLINIC_OR_DEPARTMENT_OTHER): Payer: Medicare Other | Admitting: Hematology and Oncology

## 2020-05-15 ENCOUNTER — Other Ambulatory Visit: Payer: Self-pay

## 2020-05-15 ENCOUNTER — Inpatient Hospital Stay: Payer: Medicare Other

## 2020-05-15 ENCOUNTER — Other Ambulatory Visit: Payer: Self-pay | Admitting: Hematology and Oncology

## 2020-05-15 ENCOUNTER — Telehealth: Payer: Self-pay

## 2020-05-15 VITALS — BP 135/65 | HR 86 | Temp 97.8°F | Resp 18 | Wt 165.3 lb

## 2020-05-15 DIAGNOSIS — C9200 Acute myeloblastic leukemia, not having achieved remission: Secondary | ICD-10-CM

## 2020-05-15 DIAGNOSIS — D649 Anemia, unspecified: Secondary | ICD-10-CM | POA: Diagnosis not present

## 2020-05-15 DIAGNOSIS — Z7189 Other specified counseling: Secondary | ICD-10-CM

## 2020-05-15 DIAGNOSIS — Z5111 Encounter for antineoplastic chemotherapy: Secondary | ICD-10-CM | POA: Diagnosis not present

## 2020-05-15 DIAGNOSIS — M109 Gout, unspecified: Secondary | ICD-10-CM | POA: Diagnosis not present

## 2020-05-15 DIAGNOSIS — T451X5A Adverse effect of antineoplastic and immunosuppressive drugs, initial encounter: Secondary | ICD-10-CM

## 2020-05-15 DIAGNOSIS — D701 Agranulocytosis secondary to cancer chemotherapy: Secondary | ICD-10-CM

## 2020-05-15 DIAGNOSIS — I4510 Unspecified right bundle-branch block: Secondary | ICD-10-CM | POA: Diagnosis not present

## 2020-05-15 DIAGNOSIS — E876 Hypokalemia: Secondary | ICD-10-CM

## 2020-05-15 DIAGNOSIS — D6481 Anemia due to antineoplastic chemotherapy: Secondary | ICD-10-CM

## 2020-05-15 DIAGNOSIS — N289 Disorder of kidney and ureter, unspecified: Secondary | ICD-10-CM | POA: Diagnosis not present

## 2020-05-15 DIAGNOSIS — K5090 Crohn's disease, unspecified, without complications: Secondary | ICD-10-CM | POA: Diagnosis not present

## 2020-05-15 DIAGNOSIS — D72819 Decreased white blood cell count, unspecified: Secondary | ICD-10-CM | POA: Diagnosis not present

## 2020-05-15 DIAGNOSIS — N400 Enlarged prostate without lower urinary tract symptoms: Secondary | ICD-10-CM | POA: Diagnosis not present

## 2020-05-15 DIAGNOSIS — C7951 Secondary malignant neoplasm of bone: Secondary | ICD-10-CM | POA: Diagnosis not present

## 2020-05-15 LAB — CBC WITH DIFFERENTIAL/PLATELET
Abs Immature Granulocytes: 0.1 10*3/uL — ABNORMAL HIGH (ref 0.00–0.07)
Band Neutrophils: 2 %
Basophils Absolute: 0 10*3/uL (ref 0.0–0.1)
Basophils Relative: 0 %
Blasts: 4 %
Eosinophils Absolute: 0 10*3/uL (ref 0.0–0.5)
Eosinophils Relative: 0 %
HCT: 21.7 % — ABNORMAL LOW (ref 39.0–52.0)
Hemoglobin: 7 g/dL — ABNORMAL LOW (ref 13.0–17.0)
Lymphocytes Relative: 33 %
Lymphs Abs: 0.6 10*3/uL — ABNORMAL LOW (ref 0.7–4.0)
MCH: 27.2 pg (ref 26.0–34.0)
MCHC: 32.3 g/dL (ref 30.0–36.0)
MCV: 84.4 fL (ref 80.0–100.0)
Metamyelocytes Relative: 4 %
Monocytes Absolute: 0.2 10*3/uL (ref 0.1–1.0)
Monocytes Relative: 12 %
Myelocytes: 3 %
Neutro Abs: 0.8 10*3/uL — ABNORMAL LOW (ref 1.7–7.7)
Neutrophils Relative %: 42 %
Platelets: 264 10*3/uL (ref 150–400)
RBC: 2.57 MIL/uL — ABNORMAL LOW (ref 4.22–5.81)
RDW: 17.8 % — ABNORMAL HIGH (ref 11.5–15.5)
WBC: 1.9 10*3/uL — ABNORMAL LOW (ref 4.0–10.5)
nRBC: 2.1 % — ABNORMAL HIGH (ref 0.0–0.2)

## 2020-05-15 LAB — BASIC METABOLIC PANEL
Anion gap: 9 (ref 5–15)
BUN: 35 mg/dL — ABNORMAL HIGH (ref 8–23)
CO2: 24 mmol/L (ref 22–32)
Calcium: 8.2 mg/dL — ABNORMAL LOW (ref 8.9–10.3)
Chloride: 99 mmol/L (ref 98–111)
Creatinine, Ser: 1.64 mg/dL — ABNORMAL HIGH (ref 0.61–1.24)
GFR calc Af Amer: 47 mL/min — ABNORMAL LOW (ref >60)
GFR calc non Af Amer: 41 mL/min — ABNORMAL LOW (ref >60)
Glucose, Bld: 93 mg/dL (ref 70–99)
Potassium: 3.9 mmol/L (ref 3.5–5.1)
Sodium: 132 mmol/L — ABNORMAL LOW (ref 135–145)

## 2020-05-15 LAB — RETICULOCYTES
Immature Retic Fract: 14.9 % (ref 2.3–15.9)
RBC.: 2.49 MIL/uL — ABNORMAL LOW (ref 4.22–5.81)
Retic Count, Absolute: 30.6 10*3/uL (ref 19.0–186.0)
Retic Ct Pct: 1.2 % (ref 0.4–3.1)

## 2020-05-15 LAB — SAMPLE TO BLOOD BANK

## 2020-05-15 NOTE — Progress Notes (Signed)
START OFF PATH[facility] REGIMEN - Other   [id number]:Azacitidine 75 mg/m2 SUBQ D1-5 q28 Days:   A cycle is every 28 days:     Azacitidine   **Always confirm dose/schedule in your pharmacy ordering system**  Patient Characteristics: Intent of Therapy: Non-Curative / Palliative Intent, Discussed with Patient 

## 2020-05-15 NOTE — Telephone Encounter (Signed)
-----   Message from Lequita Asal, MD sent at 05/15/2020  2:08 PM EDT ----- Regarding: Please call the patient  Potassium is 3.9.  OK to hold potassium.  We will recheck on Thursday when he comes in for blood (K only).  M ----- Message ----- From: Buel Ream, Lab In [place] Sent: 05/15/2020   8:35 AM EDT To: Lequita Asal, MD

## 2020-05-15 NOTE — Telephone Encounter (Signed)
Left message letting patient know

## 2020-05-15 NOTE — Progress Notes (Signed)
Patient on plan of care prior to pathways. 

## 2020-05-15 NOTE — Telephone Encounter (Signed)
Faxed home health orders to [facility]. fax conformation has been confirmed. The patient wife [name] has been informed). The patient is schedule for a 6 hour treatment for blood transfusion on Thursday and due to [place] -19 the patient wife was agreeable Friday to get a Pick placement. The patient and wife was understanding and agreeable.

## 2020-05-15 NOTE — Progress Notes (Signed)
[facility]  [address], [address] [place], [place] [address] Phone: [phone]  Fax: [phone]   Clinic Day:  05/15/2020  Referring physician: [name], MD  Chief Complaint: Ruben [name] is a 74 y.o. 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   HPI: The patient was last seen in the was last seen in the hematology clinic on 05/10/2020.  At that time, he was doing "so-so".  He had shortness of breath with walking.  He denied any fever.  Exam revealed marked splenomegaly (7 cm below the left costal margin) and 2+ lower extremity edema. Hematocrit was 19.0, hemoglobin 6.2, MCV 85.2, platelets 208,000, WBC 1,100 (ANC 200). Sodium was 134. Potassium was 2.7. BUN was 56 and creatinine 1.93 (CrCl 34 ml/min). Calcium was 8.3. He received 20 meq IV potassium chloride. He began oral potassium.  He received 1 unit PRBCs on 05/10/2020 and 05/11/2020.  The patient saw Dr. Ernie Hew and Dr. Evelene Croon on 05/14/2020. Bone marrow on 05/08/2020 revealed persistent disease, but with 50% reduction in blasts (20% to 5-10%).  Plan was to begin his second cycle of venetoclax and azacitidine with hopes of obtaining a remission.  Hematocrit was 19.3, hemoglobin 6.4, MCV 85.9, platelets 359,000, WBC 2,300 (ANC 900). Sodium was 130 and potassium 3.4. BUN was 37 and creatinine was 1.64 (CrCl 41 ml/min). Calcium was 8.5. Albumin was 2.5. Peripheral smear showed giant platelets, blast cells, rare promyelocytes, and rare myelocytes.  Plan is as as follows:  Begin cycle #2 azacitidine and venetoclax: azacitidine 75 mg/m2 SQ x7 days with venetoclax 100 mg p.o. day 1 - 28 (dose reduced for DDI with posaconazole).   Prophylaxis to consist of posaconazole 300 mg p.o. daily, Levaquin 250 mg p.o. daily (DR for CKD), valacyclovir 500 mg p.o. daily.   Transfusion requirements with labs 2-3  times per week.  If bleeding, try to keep platelets > 30,000.   Tranexamic acid 1300 mg p.o. TID if bleeding and platelets < 30,000.   Bone marrow biopsy on 06/13/2020.   He will be seen in follow-up at [facility] on 07/06/20.    During the interim, he has been "pretty good." He feels best in the mornings and gets more fatigued as the day goes on. His wife states that he is still short of breath and has a cough. Denies nosebleeds, fevers, and bleeding of any other kind. He is still not taking the tranexamic acid. He is eating better but still has diarrhea that comes and goes. He had an episode of nausea yesterday at lunch time but was able to eat dinner with no problems. His wife is encouraging him to start moving around the house more.  The patient received 2 units of PRBCs yesterday.  The patient is leaning towards a second cycle of chemotherapy, but would like to discuss it with his wife first. He would like to proceed with a transfusion of 2 units PRBCs in the next 2 days.   Past Medical History:  Diagnosis Date  . Blood dyscrasia   . BPH (benign prostatic hyperplasia)   . Cancer ([place])    SKIN/ POLYCYTHEMIA VERA  . Chronic kidney disease    RENAL INSUFF (40%)  . Crohn disease ([place])   . Crohn's disease ([place])   . Glaucoma   . Glaucoma   . Gout   . History of chicken pox   . Myelofibrosis (  [place])   . Nosebleed   . RBBB   . Right bundle branch block     Past Surgical History:  Procedure Laterality Date  . CATARACT EXTRACTION W/PHACO Left 07/13/2018   Procedure: CATARACT EXTRACTION PHACO AND INTRAOCULAR LENS PLACEMENT (IOC);  Surgeon: [name], MD;  Location: [facility] ORS;  Service: Ophthalmology;  Laterality: Left;  [place] 00:39  CDE 5.14 Fluid pack lot # [id number]  . CATARACT EXTRACTION W/PHACO Right 08/03/2018   Procedure: CATARACT EXTRACTION PHACO AND INTRAOCULAR LENS PLACEMENT (IOC);  Surgeon: [name], MD;  Location: [facility] ORS;  Service: Ophthalmology;  Laterality:  Right;  [place] 00:43.4 CDE 5.23 Fluid pack Lot # [id number]  . COLONOSCOPY WITH PROPOFOL N/A 03/27/2017   Procedure: COLONOSCOPY WITH PROPOFOL;  Surgeon: [name], MD;  Location: [facility] ENDOSCOPY;  Service: Endoscopy;  Laterality: N/A;  . MOHS SURGERY     EAR  . PROSTATE SURGERY  2008   Prostate Biopsy in [place] to elevated PSA.  per patient normal  . Skin Lesion Basal cell removed    . wart removal     from eyelid    Family History  Problem Relation Age of Onset  . Cancer Mother        Liver  . Cancer Father        Prostate  . Cancer Sister        Hodgkins lymphoma  . Cancer Paternal Aunt        Breast    Social History:  reports that he quit smoking about 16 years ago. His smoking use included cigarettes. He has a 5.00 pack-year smoking history. He has never used smokeless tobacco. He reports previous alcohol use of about 4.0 - 5.0 standard drinks of alcohol per week. He reports that he does not use drugs. The patientpreviously worked for [employer]. He no longer volunteers for [employer]. The patient's wife [phone]).He lives in [place]. The patient is accompanied by [name] today.  Allergies: No Known Allergies  Current Medications: Current Outpatient Medications  Medication Sig Dispense Refill  . acetaminophen (TYLENOL) 500 MG tablet Take 500 mg by mouth daily as needed for moderate pain.    Marland Kitchen allopurinol (ZYLOPRIM) 300 MG tablet TAKE 1/2 TABLET (150 MG TOTAL) BY MOUTH DAILY. 45 tablet 1  . Cyanocobalamin (RA VITAMIN B-12 TR) 1000 MCG TBCR Take 1,000 mcg by mouth daily at 12 noon.     . finasteride (PROSCAR) 5 MG tablet Take 1 tablet (5 mg total) by mouth every evening. 90 tablet 3  . folic acid (FOLVITE) 1 MG tablet Take 1 mg by mouth daily at 12 noon.     . latanoprost (XALATAN) 0.005 % ophthalmic solution Place 1 drop into both eyes at bedtime.     Marland Kitchen levofloxacin (LEVAQUIN) 250 MG tablet Take 250 mg by mouth daily.    . midodrine  (PROAMATINE) 5 MG tablet Take 1 tablet (5 mg total) by mouth 3 (three) times daily with meals. 90 tablet 0  . POSACONAZOLE PO Take 300 mg by mouth daily.    . potassium chloride (KLOR-CON) 10 MEQ tablet Take as directed by MD. 30 tablet 0  . potassium chloride SA (KLOR-CON) 20 MEQ tablet Take 1 tablet (20 mEq total) by mouth daily. 30 tablet 0  . sodium bicarbonate 650 MG tablet Take 650 mg by mouth 2 (two) times daily.    . timolol (TIMOPTIC) 0.5 % ophthalmic solution Place 1 drop into both eyes daily. In AM  5  .  valACYclovir (VALTREX) 500 MG tablet Take 500 mg by mouth daily.    . cyanocobalamin 1000 MCG tablet Take by mouth. (Patient not taking: Reported on 05/02/2020)    . torsemide (DEMADEX) 20 MG tablet Take 1 tablet (20 mg total) by mouth daily. (Patient not taking: Reported on 05/15/2020) 30 tablet 0  . tranexamic acid (LYSTEDA) 650 MG TABS tablet Take 650 mg by mouth 2 (two) times daily. (Patient not taking: Reported on 05/15/2020)     No current facility-administered medications for this visit.    Review of Systems  Constitutional: Positive for malaise/fatigue (in afternoon). Negative for chills, diaphoresis, fever and weight loss (up 7 lbs).       Feels "pretty good".  HENT: Negative for congestion, ear discharge, ear pain, hearing loss, nosebleeds, sinus pain, sore throat and tinnitus.   Eyes: Negative for blurred vision.  Respiratory: Positive for cough and shortness of breath (on walking). Negative for hemoptysis and sputum production.   Cardiovascular: Negative for chest pain, palpitations and leg swelling.  Gastrointestinal: Positive for diarrhea (on and off) and nausea (One episode yesterday.). Negative for abdominal pain, blood in stool, constipation, heartburn, melena and vomiting.       Eating better.  Genitourinary: Negative for dysuria, frequency, hematuria and urgency.  Musculoskeletal: Negative for back pain, joint pain, myalgias and neck pain.  Skin: Negative for  itching and rash.  Neurological: Negative for dizziness, tingling, sensory change, weakness and headaches.  Endo/Heme/Allergies: Does not bruise/bleed easily.  Psychiatric/Behavioral: Negative for depression and memory loss. The patient is not nervous/anxious and does not have insomnia.   All other systems reviewed and are negative.  Performance status (ECOG):2  Vitals Blood pressure 135/65, pulse 86, temperature 97.8 F (36.6 C), temperature source Tympanic, resp. rate 18, weight 165 lb 5.5 oz (75 kg), SpO2 100 %.   Physical Exam Vitals and nursing note reviewed.  Constitutional:      General: He is not in acute distress.    Appearance: He is not diaphoretic.     Comments: Chronically fatigued appearing gentleman in no acute distress.   HENT:     Head: Normocephalic and atraumatic.     Comments: Short thin gray hair with temporal wasting.    Mouth/Throat:     Mouth: Mucous membranes are moist.     Pharynx: Oropharynx is clear.  Eyes:     General: No scleral icterus.    Extraocular Movements: Extraocular movements intact.     Conjunctiva/sclera: Conjunctivae normal.     Pupils: Pupils are equal, round, and reactive to light.     Comments: Black rimmed glasses.  Cardiovascular:     Rate and Rhythm: Normal rate and regular rhythm.     Heart sounds: Normal heart sounds. No murmur heard.   Pulmonary:     Effort: Pulmonary effort is normal. No respiratory distress.     Breath sounds: Normal breath sounds. No wheezing or rales.  Chest:     Chest wall: No tenderness.  Abdominal:     General: Bowel sounds are normal. There is no distension.     Palpations: There is splenomegaly (5 fingerbreadths below left costal margin). There is no hepatomegaly or mass.     Tenderness: There is no abdominal tenderness. There is no guarding or rebound.  Musculoskeletal:        General: No swelling or tenderness.     Cervical back: Normal range of motion and neck supple.  Lymphadenopathy:      Head:  Right side of head: No preauricular, posterior auricular or occipital adenopathy.     Left side of head: No preauricular, posterior auricular or occipital adenopathy.     Cervical: No cervical adenopathy.     Upper Body:     Right upper body: No supraclavicular or axillary adenopathy.     Left upper body: No supraclavicular or axillary adenopathy.     Lower Body: No right inguinal adenopathy. No left inguinal adenopathy.  Skin:    General: Skin is warm and dry.  Neurological:     Mental Status: He is alert and oriented to person, place, and time.  Psychiatric:        Behavior: Behavior normal.        Thought Content: Thought content normal.        Judgment: Judgment normal.    Appointment on 05/15/2020  Component Date Value Ref Range Status  . WBC 05/15/2020 1.9* 4.0 - 10.5 K/uL Final  . RBC 05/15/2020 2.57* 4.22 - 5.81 MIL/uL Final  . Hemoglobin 05/15/2020 7.0* 13.0 - 17.0 g/dL Final  . HCT 05/15/2020 21.7* 39 - 52 % Final  . MCV 05/15/2020 84.4  80.0 - 100.0 fL Final  . MCH 05/15/2020 27.2  26.0 - 34.0 pg Final  . MCHC 05/15/2020 32.3  30.0 - 36.0 g/dL Final  . RDW 05/15/2020 17.8* 11.5 - 15.5 % Final  . Platelets 05/15/2020 264  150 - 400 K/uL Final  . nRBC 05/15/2020 2.1* 0.0 - 0.2 % Final   Performed at [facility], [address] [place], [place] [address]  . Neutrophils Relative % 05/15/2020 PENDING  % Incomplete  . Neutro Abs 05/15/2020 PENDING  1.7 - 7.7 K/uL Incomplete  . Band Neutrophils 05/15/2020 PENDING  % Incomplete  . Lymphocytes Relative 05/15/2020 PENDING  % Incomplete  . Lymphs Abs 05/15/2020 PENDING  0.7 - 4.0 K/uL Incomplete  . Monocytes Relative 05/15/2020 PENDING  % Incomplete  . Monocytes Absolute 05/15/2020 PENDING  0 - 1 K/uL Incomplete  . Eosinophils Relative 05/15/2020 PENDING  % Incomplete  . Eosinophils Absolute 05/15/2020 PENDING  0 - 0 K/uL Incomplete  . Basophils Relative 05/15/2020 PENDING  % Incomplete  . Basophils  Absolute 05/15/2020 PENDING  0 - 0 K/uL Incomplete  . WBC Morphology 05/15/2020 PENDING   Incomplete  . RBC Morphology 05/15/2020 PENDING   Incomplete  . Smear Review 05/15/2020 PENDING   Incomplete  . Other 05/15/2020 PENDING  % Incomplete  . nRBC 05/15/2020 PENDING  0 /100 WBC Incomplete  . Metamyelocytes Relative 05/15/2020 PENDING  % Incomplete  . Myelocytes 05/15/2020 PENDING  % Incomplete  . Promyelocytes Relative 05/15/2020 PENDING  % Incomplete  . Blasts 05/15/2020 PENDING  % Incomplete  . Immature Granulocytes 05/15/2020 PENDING  % Incomplete  . Abs Immature Granulocytes 05/15/2020 PENDING  0.00 - 0.07 K/uL Incomplete    Assessment:  Ruben [name] is a 74 y.o. 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 He has had polycythemia dating back to 2013. Hematocrit was 62.1 with a hemoglobin of 19.8 on 03/28/2015. JAK 2 testing on 03/28/2015 revealed [id number] mutation. Erythropoietin level was 1.1 (low). 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  He began a phlebotomyprogram on 03/28/2015 to maintain a hematocrit goal of <45.Last phlebotomywason 05/02/2015. He is on a baby aspirin.  He was initially on hydroxyurea beginning in 11/2015 then Jakafi (05/28/2016 - **/2021).  Bone marrowon 12/14/2015 revealed a persistent myeloproliferative neoplasm with myelofibrosis and alterations compatible with myelodysplatic  progression. Marrow was packed (95-100% cellularity) with pan myelosis, multi-lineage dyspoiesis, and no significant increase in blasts. There was moderate to focally marked reticulin fibrosis (grade 2-3/3). Storage iron was not identified. Flow cytometry revealed non-specific atypical myeloid findings with no increase in blasts. 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 Cytogenetics were normal (46, XY).  [facility]on [facility]/07/2016 [facility] revealed a hypercellular marrow  (>95%) with persistent involvement by myeloid neoplasm with 5% blasts. There was mild reticulin fibrosis. FISH t(9;22) results were normal. 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 Cytogenetics are pending.  Bone marrowon 08/09/2018 revealed a hypercellular bone marrow (>95%) with persistent involvement by myeloid neoplasm with <1% blasts by manual touch preparation differential. There was marked reticulin fibrosis with focal collagen deposition. Cytogenetics were normal (46, XY). - Myeloid mutation panel study is pending.  Bone marrowon 07/14/2019 revealed a hypercellular marrow (>95%) with persistent involvement by a myeloid neoplasm with extensive fibrosis and rare (< 5%) CD34 +blast byIHC. Myeloid mutation panel revealed the patient's previously reported variants inIDH2, JAK2, KRAS, NRAS and SRSF2 were again identified. There are no new variants. Cytogenetics are pending.  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  Bone marrow on 05/08/2020 revealed persistent disease, but with 50% reduction in blasts (20% to 5-10%).   He was admitted to [facility] from 03/28/2020 - 04/04/2020 forcycle #1azacitidine and venetoclax.  Day #1 was 04/01/2020. He tolerated treatment well, with the exception of mild tumor lysis syndrome, diarrhea, epistaxis x 1, and mild hyponatremia. He completed a 5 day course ofceftriaxone andazithromycin forpneumonia.  He is currently day 40 s/p cycle #1 azacytidine and venetoclax.  Peripheral smear on 05/02/2020 showed absolute leukopenia, with circulating blasts, 6% by manual differential count.  He had a normocytic anemia, with few nucleated RBCs.  There was thrombocytopenia, with unremarkable morphology.  Findings were compatible with treatment for acute leukemia.    He has urinary retention. Renal ultrasoundon 07/28/2016 revealed bilateral hydronephrosis (right >left) and a large postvoid residual (2  liters) suggesting bladder dysfunction or outlet obstruction. He underwent temporary Foley catheter placement. He performs I/O self catheterizations. He is on Flomax and finasteride. PSAwas 2.02 on 07/09/2016.  He is followed by GI (Dr. Vira Agar) for a history of polypsand Crohn's disease.He had an unremarkable colonoscopy in 03/2017.AbdomenandpelvisCTon 04/03/2019 revealed mild wall thickening of portions of a loop of small bowel within the pelvis with intervening areas of mildly dilated small bowel,consistent with active Crohn's disease. No evidence of an abscess or fistula. No other evidence of bowel inflammation.There was splenomegaly(16.7 x 7.7 x 15.6 cm), developed since12/11/2011.There was mild bladder wall thickeningandenlargement of the prostate. Chronic bladder outlet obstructionwas felt to bethe etiology of bladder wall thickening.  He has chronicrenal insufficiency(Cr 1.85; CrCl 36 ml/min). SPEP on 09/23/2016 was negative. Spot urine revealed 17.4% of 27.3 mg/dL of a monoclonal protein. 24 hour urine on 10/16/2016 revealed no monoclonal protein. Renal function transiently decreased in 06/2017 after Bactrim. 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  He has a history of epistaxis. Normal studies included: PT, PTT, von Willebrand panel, and platelet function assay (PFA).  He has right foot drop. 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 There was no non-contrast MRI evidence of brain metastasis. There was only mild for age nonspecific cerebral white signal changes. Lumbar spine MRIon 06/09/2019 showed diffusely abnormal marrow signal in the visible skeleton,  as seen on the brain MRI. There was no pathologic fracture and no extraosseous extension of tumor on the lumbar spine or visible sacrum. In conjunction with the splenomegaly by CT in [place] infiltration  byleukemia/lymphoma may be the most likely etiology. Differential considerations included multiple myeloma and metastatic disease unknown primary. The involvement of the pelvis should be amenable to bone marrow biopsy.There was superimposed mild for age lumbar spine degeneration with no spinal stenosis.There was mild degenerative L4 and L5 neural foraminal stenosis.  He has had a history of pneumoniasince 02/2020.  He was admitted to [facility] from 04/25/2020 - 04/30/2020 for sepsis due to pneumonia. He was treated with IV fluids and antibiotics. He received a total of 2 units of PRBCs (last 04/30/2020) for severe anemia. Hospital course was complicated by acute hypoxemic respiratory failure and pulmonary edema requiring BiPAP therapy and ICU transfer. He was treated for hypokalemia, hypomagnesemia, and hyponatremia.   Patient has a normocytic anemia.  He received 2 units of PRBCs at [facility] on 05/08/2020.   He received the Moderna COVID-19vaccineon 10/28/2019 and 11/29/2019. COVID-19 testing was negative on 02/29/2020.  Symptomatically, he feels "pretty good." He feels best in the mornings and gets more fatigued as the day goes on.  He is still short of breath and has a cough. He denies any fevers or bleeding. He is eating better but still has diarrhea that comes and goes. Exam reveals splenomegaly.  Plan: 1.   Labs today: CBC with diff, BMP, hold tube.  2.   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 He is day 45 of cycle #1 azacitidine + Venetoclax. Bone marrow from 05/08/2020 revealed a 50% reduction in blasts.  Discuss patient's thoughts about cycle #2 azacitidine plus venetoclax.  Patient undecided today.  Consider initiation of chemotherapy next week.  Pre-Auth azacitidine.  Patient will require 2-3 times weekly labs with aggressive transfusion support. 3. Anemia and leukopenia Hematocrit 21.7.   Hemoglobin 9.0.  MCV 84.4.  Platelets 264,000.  WBC 1900 with an [place] of 200.  Patient has symptomatic anemia due to underlying AML.    Maintain hemoglobin > 8.             Transfuse 2 additional units of PRBCs this week.  Continue neutropenic precautions. 4. Renal insufficiency and hypokalemia             Creatinine 1.64.  Potassium 3.9.  Patient continues I/O catheterization.    Potassium supplementation as patient now off diuretic and no longer appears to be wasting potassium.  Continue to monitor. 5.   Lamar has been consulted. 6.   Preauth azacytidine. 7.   RTC on 08/25 or 08/26 for PRBCs (2 units) per patient 8.   RTC on 05/21/2020 for MD assessment, labs (CBC with diff, CMP, Mg, LDH, hold tube), and +/- day 1 of cycle #2 azacytidine (7 days of SQ).  I discussed the assessment and treatment plan with the patient.  The patient was provided an opportunity to ask questions and all were answered.  The patient agreed with the plan and demonstrated an understanding of the instructions.  The patient was advised to call back if the symptoms worsen or if the condition fails to improve as anticipated.  I provided 20 minutes of face-to-face time during this encounter and > 50% was spent counseling as documented under my assessment and plan.  An additional 10-15 minutes were spent reviewing his chart (Epic and Care Everywhere) including notes, labs, and  imaging studies as well as discussing his plans with Carvel Getting, NP at [facility].    Lequita Asal, MD, PhD    05/15/2020, 9:06 AM  I, Mirian Mo Tufford, am acting as Education administrator for [employer]. Mike Gip, MD, PhD.  I, Jorryn Hershberger C. Mike Gip, MD, have reviewed the above documentation for accuracy and completeness, and I agree with the above.

## 2020-05-16 ENCOUNTER — Other Ambulatory Visit: Payer: Self-pay

## 2020-05-16 ENCOUNTER — Other Ambulatory Visit: Payer: Self-pay | Admitting: Internal Medicine

## 2020-05-16 ENCOUNTER — Other Ambulatory Visit: Payer: Self-pay | Admitting: Hematology and Oncology

## 2020-05-16 DIAGNOSIS — D649 Anemia, unspecified: Secondary | ICD-10-CM

## 2020-05-16 DIAGNOSIS — C9200 Acute myeloblastic leukemia, not having achieved remission: Secondary | ICD-10-CM

## 2020-05-16 LAB — PREPARE RBC (CROSSMATCH)

## 2020-05-17 ENCOUNTER — Inpatient Hospital Stay: Payer: Medicare Other

## 2020-05-17 ENCOUNTER — Other Ambulatory Visit: Payer: Self-pay | Admitting: *Deleted

## 2020-05-17 ENCOUNTER — Other Ambulatory Visit: Payer: Self-pay

## 2020-05-17 ENCOUNTER — Telehealth: Payer: Self-pay

## 2020-05-17 DIAGNOSIS — E876 Hypokalemia: Secondary | ICD-10-CM

## 2020-05-17 DIAGNOSIS — D649 Anemia, unspecified: Secondary | ICD-10-CM | POA: Diagnosis not present

## 2020-05-17 DIAGNOSIS — I4510 Unspecified right bundle-branch block: Secondary | ICD-10-CM | POA: Diagnosis not present

## 2020-05-17 DIAGNOSIS — M109 Gout, unspecified: Secondary | ICD-10-CM | POA: Diagnosis not present

## 2020-05-17 DIAGNOSIS — T451X5A Adverse effect of antineoplastic and immunosuppressive drugs, initial encounter: Secondary | ICD-10-CM

## 2020-05-17 DIAGNOSIS — D72819 Decreased white blood cell count, unspecified: Secondary | ICD-10-CM | POA: Diagnosis not present

## 2020-05-17 DIAGNOSIS — C9200 Acute myeloblastic leukemia, not having achieved remission: Secondary | ICD-10-CM | POA: Diagnosis not present

## 2020-05-17 DIAGNOSIS — C7951 Secondary malignant neoplasm of bone: Secondary | ICD-10-CM | POA: Diagnosis not present

## 2020-05-17 DIAGNOSIS — K5090 Crohn's disease, unspecified, without complications: Secondary | ICD-10-CM | POA: Diagnosis not present

## 2020-05-17 DIAGNOSIS — Z5111 Encounter for antineoplastic chemotherapy: Secondary | ICD-10-CM | POA: Diagnosis not present

## 2020-05-17 DIAGNOSIS — N400 Enlarged prostate without lower urinary tract symptoms: Secondary | ICD-10-CM | POA: Diagnosis not present

## 2020-05-17 LAB — POTASSIUM: Potassium: 3.5 mmol/L (ref 3.5–5.1)

## 2020-05-17 LAB — HEMOGLOBIN: Hemoglobin: 6.2 g/dL — ABNORMAL LOW (ref 13.0–17.0)

## 2020-05-17 MED ORDER — ACETAMINOPHEN 325 MG PO TABS
650.0000 mg | ORAL_TABLET | Freq: Once | ORAL | Status: AC
  Administered 2020-05-17: 650 mg via ORAL
  Filled 2020-05-17: qty 2

## 2020-05-17 MED ORDER — DIPHENHYDRAMINE HCL 25 MG PO CAPS
25.0000 mg | ORAL_CAPSULE | Freq: Once | ORAL | Status: AC
  Administered 2020-05-17: 25 mg via ORAL
  Filled 2020-05-17: qty 1

## 2020-05-17 MED ORDER — SODIUM CHLORIDE 0.9% IV SOLUTION
250.0000 mL | Freq: Once | INTRAVENOUS | Status: AC
  Administered 2020-05-17: 250 mL via INTRAVENOUS
  Filled 2020-05-17: qty 250

## 2020-05-17 NOTE — Progress Notes (Unsigned)
UNMATCHED BLOOD PRODUCT NOTE  Compare the patient ID on the blood tag to the patient ID on the hospital armband and Blood Bank armband. Then confirm the unit number on the blood tag matches the unit number on the blood product.  If a discrepancy is discovered return the product to blood bank immediately.   Blood Product TypeRBC   Unit [id number]  Product Code #: [id number]   Start Time: 11:15AM   Starting Rate: 120CCHRml/hr  Rate increase/decreased  (if applicable):  375    ml/hr  Rate changed time (if applicable): 43:60   Stop Time: ***   All Other Documentation should be documented within the Blood Admin Flowsheet per policy.  11:30AM 15 MIN vs 99 TYMP. 80 18 127/68

## 2020-05-17 NOTE — Progress Notes (Incomplete)
UNMATCHED BLOOD PRODUCT NOTE  Compare the patient ID on the blood tag to the patient ID on the hospital armband and Blood Bank armband. Then confirm the unit number on the blood tag matches the unit number on the blood product.  If a discrepancy is discovered return the product to blood bank immediately.   Blood Product Type: {CHL IP BLOOD PRODUCTS:20952}  Unit #: (Found on blood product bag, begins with W) ***  Product Code #: (Found on blood product bag, begins with E) ***   Start Time: ***  Starting Rate: *** ml/hr  Rate increase/decreased  (if applicable):      ml/hr  Rate changed time (if applicable):    Stop Time: ***   All Other Documentation should be documented within the Blood Admin Flowsheet per policy.

## 2020-05-17 NOTE — Progress Notes (Signed)
[facility]  [address] [place], [place] [address] Phone: [phone]  Fax: [phone]   Clinic Day:  05/21/2020  Referring physician: [name], MD  Chief Complaint: Ruben [name] is a 74 y.o. 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  HPI: The patient was last seen in the was last seen in the hematology clinic on 05/15/2020.  At that time, he felt "pretty good." He felt best in the mornings and got more fatigued as the day went on.  He was short of breath and had a cough. He denied any fevers or bleeding. He was eating better but still had diarrhea that would come and go. Exam revealed splenomegaly. Hematocrit was 21.7, hemoglobin 7.0, MCV 84.4, platelets 264,000, WBC 1,900 (ANC 800). Sodium was 132 and potassium 3.9. BUN was 35. Creatinine was 1.64 (CrCl 41 ml/min). Calcium was 8.2. Reticulocyte count was 1.2%.  Labs on 05/17/2020 revealed a hemoglobin of 6.2.  Potassium was 3.5. The patient received 2 units of RBCs.  He had a right tunneled IJ PICC line placed on 05/18/2020.  During the interim, he has been "fair." His energy level is still low. He denies fevers. He has been trying to eat more, but his appetite is decreased.  He has early satiety. He has been drinking Boost. He is still has shortness of breath and has a cough, which his wife thinks may be worsened by allergies.   Past Medical History:  Diagnosis Date  . Blood dyscrasia   . BPH (benign prostatic hyperplasia)   . Cancer ([place])    SKIN/ POLYCYTHEMIA VERA  . Chronic kidney disease    RENAL INSUFF (40%)  . Crohn disease ([place])   . Crohn's disease ([place])   . Glaucoma   . Glaucoma   . Gout   . History of chicken pox   . Myelofibrosis ([place])   . Nosebleed   . RBBB   . Right bundle branch block     Past Surgical History:  Procedure Laterality Date  . CATARACT  EXTRACTION W/PHACO Left 07/13/2018   Procedure: CATARACT EXTRACTION PHACO AND INTRAOCULAR LENS PLACEMENT (IOC);  Surgeon: [name], MD;  Location: [facility] ORS;  Service: Ophthalmology;  Laterality: Left;  [place] 00:39  CDE 5.14 Fluid pack lot # [id number]  . CATARACT EXTRACTION W/PHACO Right 08/03/2018   Procedure: CATARACT EXTRACTION PHACO AND INTRAOCULAR LENS PLACEMENT (IOC);  Surgeon: [name], MD;  Location: [facility] ORS;  Service: Ophthalmology;  Laterality: Right;  [place] 00:43.4 CDE 5.23 Fluid pack Lot # [id number]  . COLONOSCOPY WITH PROPOFOL N/A 03/27/2017   Procedure: COLONOSCOPY WITH PROPOFOL;  Surgeon: [name], MD;  Location: [facility] ENDOSCOPY;  Service: Endoscopy;  Laterality: N/A;  . IR FLUORO GUIDE CV LINE LEFT  05/18/2020  . MOHS SURGERY     EAR  . PROSTATE SURGERY  2008   Prostate Biopsy in [place] to elevated PSA.  per patient normal  . Skin Lesion Basal cell removed    . wart removal     from eyelid    Family History  Problem Relation Age of Onset  . Cancer Mother        Liver  . Cancer Father        Prostate  . Cancer Sister        Hodgkins lymphoma  . Cancer Paternal Aunt  Breast    Social History:  reports that he quit smoking about 16 years ago. His smoking use included cigarettes. He has a 5.00 pack-year smoking history. He has never used smokeless tobacco. He reports previous alcohol use of about 4.0 - 5.0 standard drinks of alcohol per week. He reports that he does not use drugs. The patientpreviously worked for [employer]. He no longer volunteers for [employer]. The patient's wife [phone]).He lives in [place]. The patient is accompanied by [name] today.  Allergies: No Known Allergies  Current Medications: Current Outpatient Medications  Medication Sig Dispense Refill  . acetaminophen (TYLENOL) 500 MG tablet Take 500 mg by mouth daily as needed for moderate pain.    Marland Kitchen allopurinol (ZYLOPRIM) 300 MG tablet  TAKE 1/2 TABLET (150 MG TOTAL) BY MOUTH DAILY. 45 tablet 1  . Cyanocobalamin (RA VITAMIN B-12 TR) 1000 MCG TBCR Take 1,000 mcg by mouth daily at 12 noon.     . finasteride (PROSCAR) 5 MG tablet Take 1 tablet (5 mg total) by mouth every evening. 90 tablet 3  . folic acid (FOLVITE) 1 MG tablet Take 1 mg by mouth daily at 12 noon.     . latanoprost (XALATAN) 0.005 % ophthalmic solution Place 1 drop into both eyes at bedtime.     Marland Kitchen levofloxacin (LEVAQUIN) 250 MG tablet Take 250 mg by mouth daily.    . midodrine (PROAMATINE) 5 MG tablet Take 1 tablet (5 mg total) by mouth 3 (three) times daily with meals. 90 tablet 0  . POSACONAZOLE PO Take 300 mg by mouth daily.    . potassium chloride (KLOR-CON) 10 MEQ tablet Take as directed by MD. 30 tablet 0  . potassium chloride SA (KLOR-CON) 20 MEQ tablet Take 1 tablet (20 mEq total) by mouth daily. 30 tablet 0  . sodium bicarbonate 650 MG tablet Take 650 mg by mouth 2 (two) times daily.    . timolol (TIMOPTIC) 0.5 % ophthalmic solution Place 1 drop into both eyes daily. In AM  5  . valACYclovir (VALTREX) 500 MG tablet Take 500 mg by mouth daily.    . cyanocobalamin 1000 MCG tablet Take by mouth. (Patient not taking: Reported on 05/02/2020)    . torsemide (DEMADEX) 20 MG tablet Take 1 tablet (20 mg total) by mouth daily. (Patient not taking: Reported on 05/15/2020) 30 tablet 0  . tranexamic acid (LYSTEDA) 650 MG TABS tablet Take 650 mg by mouth 2 (two) times daily. (Patient not taking: Reported on 05/15/2020)     No current facility-administered medications for this visit.    Review of Systems  Constitutional: Positive for malaise/fatigue (low energy). Negative for chills, diaphoresis, fever and weight loss (up 3 lbs).       Feels "fair".  HENT: Negative for congestion, ear discharge, ear pain, hearing loss, nosebleeds, sinus pain, sore throat and tinnitus.   Eyes: Negative for blurred vision.  Respiratory: Positive for cough and shortness of breath (on  walking). Negative for hemoptysis and sputum production.   Cardiovascular: Negative for chest pain, palpitations and leg swelling.  Gastrointestinal: Positive for diarrhea (on and off). Negative for abdominal pain, blood in stool, constipation, heartburn, melena, nausea and vomiting.       Early satiety. Decreased appetite. Drinking Boost.  Genitourinary: Negative for dysuria, frequency, hematuria and urgency.  Musculoskeletal: Negative for back pain, joint pain, myalgias and neck pain.  Skin: Negative for itching and rash.  Neurological: Negative for dizziness, tingling, sensory change, weakness and headaches.  Endo/Heme/Allergies:  Does not bruise/bleed easily.  Psychiatric/Behavioral: Negative for depression and memory loss. The patient is not nervous/anxious and does not have insomnia.   All other systems reviewed and are negative.  Performance status (ECOG): 2  Vitals Blood pressure 123/60, pulse 75, temperature (!) 97.3 F (36.3 C), temperature source Tympanic, resp. rate 18, height 6' (1.829 m), weight 168 lb 15.7 oz (76.6 kg), SpO2 100 %.   Physical Exam Vitals and nursing note reviewed.  Constitutional:      General: He is not in acute distress.    Appearance: He is not diaphoretic.     Comments: Chronically fatigued appearing gentleman in no acute distress.   HENT:     Head: Normocephalic and atraumatic.     Comments: Short thin gray hair with temporal wasting.    Mouth/Throat:     Mouth: Mucous membranes are moist.     Pharynx: Oropharynx is clear.  Eyes:     General: No scleral icterus.    Extraocular Movements: Extraocular movements intact.     Conjunctiva/sclera: Conjunctivae normal.     Pupils: Pupils are equal, round, and reactive to light.  Cardiovascular:     Rate and Rhythm: Normal rate and regular rhythm.     Heart sounds: Normal heart sounds. No murmur heard.   Pulmonary:     Effort: Pulmonary effort is normal. No respiratory distress.     Breath sounds:  Normal breath sounds. No wheezing or rales.  Chest:     Chest wall: No tenderness.  Abdominal:     General: Bowel sounds are normal. There is no distension.     Palpations: There is splenomegaly (5 fingerbreadths below left costal margin). There is no hepatomegaly or mass.     Tenderness: There is no abdominal tenderness. There is no guarding or rebound.  Musculoskeletal:        General: No swelling or tenderness.     Cervical back: Normal range of motion and neck supple.  Lymphadenopathy:     Head:     Right side of head: No preauricular, posterior auricular or occipital adenopathy.     Left side of head: No preauricular, posterior auricular or occipital adenopathy.     Cervical: No cervical adenopathy.     Upper Body:     Right upper body: No supraclavicular or axillary adenopathy.     Left upper body: No supraclavicular or axillary adenopathy.     Lower Body: No right inguinal adenopathy. No left inguinal adenopathy.  Skin:    General: Skin is warm and dry.     Comments: Leftover glue on chest.  Neurological:     Mental Status: He is alert and oriented to person, place, and time.  Psychiatric:        Behavior: Behavior normal.        Thought Content: Thought content normal.        Judgment: Judgment normal.    Appointment on 05/21/2020  Component Date Value Ref Range Status  . LDH 05/21/2020 1,092* 98 - 192 U/L Final   Performed at [facility], [address] [place], [place] [address]  . Magnesium 05/21/2020 1.6* 1.7 - 2.4 mg/dL Final   Performed at [facility], [address] [place], [place] [address]  . Sodium 05/21/2020 129* 135 - 145 mmol/L Final  . Potassium 05/21/2020 3.1* 3.5 - 5.1 mmol/L Final  . Chloride 05/21/2020 99  98 - 111 mmol/L Final  . CO2 05/21/2020 22  22 -  32 mmol/L Final  . Glucose, Bld 05/21/2020 103* 70 - 99 mg/dL Final   Glucose reference range applies only to samples taken after fasting for at least 8 hours.  . BUN  05/21/2020 27* 8 - 23 mg/dL Final  . Creatinine, Ser 05/21/2020 1.58* 0.61 - 1.24 mg/dL Final  . Calcium 05/21/2020 7.9* 8.9 - 10.3 mg/dL Final  . Total Protein 05/21/2020 8.5* 6.5 - 8.1 g/dL Final  . Albumin 05/21/2020 2.4* 3.5 - 5.0 g/dL Final  . AST 05/21/2020 42* 15 - 41 U/L Final  . ALT 05/21/2020 6  0 - 44 U/L Final  . Alkaline Phosphatase 05/21/2020 68  38 - 126 U/L Final  . Total Bilirubin 05/21/2020 1.4* 0.3 - 1.2 mg/dL Final  . GFR calc non Af Amer 05/21/2020 43* >60 mL/min Final  . GFR calc Af Amer 05/21/2020 50* >60 mL/min Final  . Anion gap 05/21/2020 8  5 - 15 Final   Performed at [facility], [address] [place], [place] [address]  . WBC 05/21/2020 PENDING  4.0 - 10.5 K/uL Incomplete  . RBC 05/21/2020 2.44* 4.22 - 5.81 MIL/uL Final  . Hemoglobin 05/21/2020 6.8* 13.0 - 17.0 g/dL Final  . HCT 05/21/2020 20.7* 39 - 52 % Final  . MCV 05/21/2020 84.8  80.0 - 100.0 fL Final  . MCH 05/21/2020 27.9  26.0 - 34.0 pg Final  . MCHC 05/21/2020 32.9  30.0 - 36.0 g/dL Final  . RDW 05/21/2020 18.0* 11.5 - 15.5 % Final  . Platelets 05/21/2020 281  150 - 400 K/uL Final   Performed at [facility], [address] [place], [place] [address]  . nRBC 05/21/2020 PENDING  0.0 - 0.2 % Incomplete  . Neutrophils Relative % 05/21/2020 PENDING  % Incomplete  . Neutro Abs 05/21/2020 PENDING  1.7 - 7.7 K/uL Incomplete  . Band Neutrophils 05/21/2020 PENDING  % Incomplete  . Lymphocytes Relative 05/21/2020 PENDING  % Incomplete  . Lymphs Abs 05/21/2020 PENDING  0.7 - 4.0 K/uL Incomplete  . Monocytes Relative 05/21/2020 PENDING  % Incomplete  . Monocytes Absolute 05/21/2020 PENDING  0 - 1 K/uL Incomplete  . Eosinophils Relative 05/21/2020 PENDING  % Incomplete  . Eosinophils Absolute 05/21/2020 PENDING  0 - 0 K/uL Incomplete  . Basophils Relative 05/21/2020 PENDING  % Incomplete  . Basophils Absolute 05/21/2020 PENDING  0 - 0 K/uL Incomplete  . WBC Morphology  05/21/2020 PENDING   Incomplete  . RBC Morphology 05/21/2020 PENDING   Incomplete  . Smear Review 05/21/2020 PENDING   Incomplete  . Other 05/21/2020 PENDING  % Incomplete  . nRBC 05/21/2020 PENDING  0 /100 WBC Incomplete  . Metamyelocytes Relative 05/21/2020 PENDING  % Incomplete  . Myelocytes 05/21/2020 PENDING  % Incomplete  . Promyelocytes Relative 05/21/2020 PENDING  % Incomplete  . Blasts 05/21/2020 PENDING  % Incomplete  . Immature Granulocytes 05/21/2020 PENDING  % Incomplete  . Abs Immature Granulocytes 05/21/2020 PENDING  0.00 - 0.07 K/uL Incomplete    Assessment:  ARNEY MAYABB is a 74 y.o. 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 He has had polycythemia dating back to 2013. Hematocrit was 62.1 with a hemoglobin of 19.8 on 03/28/2015. JAK 2 testing on 03/28/2015 revealed [id number] mutation. Erythropoietin level was 1.1 (low). 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  He began a phlebotomyprogram on 03/28/2015 to maintain a hematocrit goal of <45.Last phlebotomywason 05/02/2015. He is on a baby aspirin.  He was initially on  hydroxyurea beginning in 11/2015 then Jakafi (05/28/2016 - **/2021).  Bone marrowon 12/14/2015 revealed a persistent myeloproliferative neoplasm with myelofibrosis and alterations compatible with myelodysplatic progression. Marrow was packed (95-100% cellularity) with pan myelosis, multi-lineage dyspoiesis, and no significant increase in blasts. There was moderate to focally marked reticulin fibrosis (grade 2-3/3). Storage iron was not identified. Flow cytometry revealed non-specific atypical myeloid findings with no increase in blasts. 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 Cytogenetics were normal (46, XY).  Bone marrowon 04/07/2016 at [facility] revealed a hypercellular marrow (>95%) with persistent involvement by myeloid neoplasm with 5% blasts.  There was mild reticulin fibrosis. FISH t(9;22) results were normal. 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 Cytogenetics are pending.  Bone marrowon 08/09/2018 revealed a hypercellular bone marrow (>95%) with persistent involvement by myeloid neoplasm with <1% blasts by manual touch preparation differential. There was marked reticulin fibrosis with focal collagen deposition. Cytogenetics were normal (46, XY). - Myeloid mutation panel study is pending.  Bone marrowon 07/14/2019 revealed a hypercellular marrow (>95%) with persistent involvement by a myeloid neoplasm with extensive fibrosis and rare (< 5%) CD34 +blast byIHC. Myeloid mutation panel revealed the patient's previously reported variants inIDH2, JAK2, KRAS, NRAS and SRSF2 were again identified. There are no new variants. Cytogenetics are pending.  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  Bone marrow on 05/08/2020 revealed persistent disease, but with 50% reduction in blasts (20% to 5-10%).   He was admitted to [facility] from 03/28/2020 - 04/04/2020 forcycle #1azacitidine and venetoclax.  Day #1 was 04/01/2020. He tolerated treatment well, with the exception of mild tumor lysis syndrome, diarrhea, epistaxis x 1, and mild hyponatremia. He completed a 5 day course ofceftriaxone andazithromycin forpneumonia.  He is currently day 40 s/p cycle #1 azacytidine and venetoclax.  Peripheral smear on 05/02/2020 showed absolute leukopenia, with circulating blasts, 6% by manual differential count.  He had a normocytic anemia, with few nucleated RBCs.  There was thrombocytopenia, with unremarkable morphology.  Findings were compatible with treatment for acute leukemia.    He has urinary retention. Renal ultrasoundon 07/28/2016 revealed bilateral hydronephrosis (right >left) and a large postvoid residual (2 liters) suggesting bladder dysfunction or outlet obstruction. He  underwent temporary Foley catheter placement. He performs I/O self catheterizations. He is on Flomax and finasteride. PSAwas 2.02 on 07/09/2016.  He is followed by GI (Dr. Vira Agar) for a history of polypsand Crohn's disease.He had an unremarkable colonoscopy in 03/2017.AbdomenandpelvisCTon 04/03/2019 revealed mild wall thickening of portions of a loop of small bowel within the pelvis with intervening areas of mildly dilated small bowel,consistent with active Crohn's disease. No evidence of an abscess or fistula. No other evidence of bowel inflammation.There was splenomegaly(16.7 x 7.7 x 15.6 cm), developed since12/11/2011.There was mild bladder wall thickeningandenlargement of the prostate. Chronic bladder outlet obstructionwas felt to bethe etiology of bladder wall thickening.  He has chronicrenal insufficiency(Cr 1.85; CrCl 36 ml/min). SPEP on 09/23/2016 was negative. Spot urine revealed 17.4% of 27.3 mg/dL of a monoclonal protein. 24 hour urine on 10/16/2016 revealed no monoclonal protein. Renal function transiently decreased in 06/2017 after Bactrim. 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  He has a history of epistaxis. Normal studies included: PT, PTT, von Willebrand panel, and platelet function assay (PFA).  He has right foot drop. 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 There was no non-contrast MRI evidence of brain  metastasis. There was only mild for age nonspecific cerebral white signal changes. Lumbar spine MRIon 06/09/2019 showed diffusely abnormal marrow signal in the visible skeleton, as seen on the brain MRI. There was no pathologic fracture and no extraosseous extension of tumor on the lumbar spine or visible sacrum. In conjunction with the splenomegaly by CT in [place] infiltration byleukemia/lymphoma may be the most likely etiology. Differential  considerations included multiple myeloma and metastatic disease unknown primary. The involvement of the pelvis should be amenable to bone marrow biopsy.There was superimposed mild for age lumbar spine degeneration with no spinal stenosis.There was mild degenerative L4 and L5 neural foraminal stenosis.  He has had a history of pneumoniasince 02/2020.  He was admitted to [facility] from 04/25/2020 - 04/30/2020 for sepsis due to pneumonia. He was treated with IV fluids and antibiotics. He received a total of 2 units of PRBCs (last 04/30/2020) for severe anemia. Hospital course was complicated by acute hypoxemic respiratory failure and pulmonary edema requiring BiPAP therapy and ICU transfer. He was treated for hypokalemia, hypomagnesemia, and hyponatremia.   Patient has a normocytic anemia.  He received 2 units of PRBCs at [facility] on 05/08/2020.   He received the Moderna COVID-19vaccineon 10/28/2019 and 11/29/2019. COVID-19 testing was negative on 02/29/2020.  Symptomatically, he is fatigued.  He denies any pain.  He has a dry chronic cough felt secondary to allergies.  Exam revealed marked splenomegaly.  Plan: 1.   Labs today: CBC with diff, CMP, Mg, LDH, hold tube 2.   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 Discuss plans to begin cycle #2 azacitidine + Venetoclax.   Patient in agreement.   He will receive day 1-5 azacitidiine Monday-Friday this week then Tuesday-Wednesday next week.   Discuss plans for transfusion support on Tuesdays and Fridays.  Discuss plans for bone marrow on 06/13/2020 at [facility].  Review prophylactic antibiotics:   Posoconazole 300 mg po q day.   Levaquin 250 mg po q day.   Valacyxclovir 500 mg po q day.  Labs reviewed.  Begin day 1 azacitidine today.  Begin venetoclax 100 mg po q day, day 1-28. 3. Anemia, transfusion dependent Hematocrit 20.7.  Hemoglobin 6.8.  MCV 84.8.  Platelets  281,000.  WBC 4200 with an ANC of 2300.  Patient has symptomatic anemia secondary to chemotherapy and AML.    Review plans for transfusion twice weekly on Tuesdays and Fridays.             Maintain Hgb >8.0.  If bleeding, maintain platelets > 30,000.  Patient to notify clinic if he has any fevers. 4. Renal insufficiency and hypokalemia             Creatinine 1.58.  Potassium 3.1.  Patient continues I/O catheterization.    Supplement potassium cautiously given renal insufficiency   Patient to take potassium 20 mEq p.o. twice daily today then 20 mEq a day.  Recheck potassium later in the week. 5.   Hypomagnesemia  Magnesium 1.6 today.  Magnesium 1 gm IV today. 6.   Begin day 1 of cycle #2 azacitidine SQ + venetoclax. 7.   Transfuse 2 units PRBCs tomorrow. 8.   Labs on 05/24/2020: BMP, CBC, type and screen. 9.   Transfuse 2 units PRBCs on 05/25/2020. 10.   RTC on 09/07 for MD assessment, labs (CBC with diff, CMP, Mg, type and cross), day 6 of cycle #2 azactidine SQ, and 1 unit PRBcs. 11.   RTC on 09/08 for day 7 of cycle #  2 azacitidine SQ and 1 unit PRBCs. 12.   RTC on 05/31/2020 for labs (CBC with diff, BMP) and transfusion in [place] on 06/01/2020.  I discussed the assessment and treatment plan with the patient.  The patient was provided an opportunity to ask questions and all were answered.  The patient agreed with the plan and demonstrated an understanding of the instructions.  The patient was advised to call back if the symptoms worsen or if the condition fails to improve as anticipated.   Lequita Asal, MD, PhD    05/21/2020, 9:58 AM  I, Mirian Mo Tufford, am acting as Education administrator for [employer]. Mike Gip, MD, PhD.  I, Srikar Chiang C. Mike Gip, MD, have reviewed the above documentation for accuracy and completeness, and I agree with the above.

## 2020-05-17 NOTE — Telephone Encounter (Signed)
Referral note has been sent to [facility] for Home health service, per his wife. Fax conformation confirmed.

## 2020-05-18 ENCOUNTER — Other Ambulatory Visit: Payer: Self-pay

## 2020-05-18 ENCOUNTER — Ambulatory Visit
Admission: RE | Admit: 2020-05-18 | Discharge: 2020-05-18 | Disposition: A | Discharge status: Home / Self Care | Payer: Medicare Other | Source: Ambulatory Visit | Attending: Hematology and Oncology | Admitting: Hematology and Oncology

## 2020-05-18 ENCOUNTER — Ambulatory Visit: Payer: Medicare Other

## 2020-05-18 DIAGNOSIS — C9200 Acute myeloblastic leukemia, not having achieved remission: Secondary | ICD-10-CM

## 2020-05-18 DIAGNOSIS — N289 Disorder of kidney and ureter, unspecified: Secondary | ICD-10-CM | POA: Diagnosis not present

## 2020-05-18 HISTORY — PX: IR FLUORO GUIDE CV LINE LEFT: IMG2282

## 2020-05-18 LAB — TYPE AND SCREEN
ABO/RH(D): O POS
Antibody Screen: NEGATIVE
Unit division: 0
Unit division: 0

## 2020-05-18 LAB — BPAM RBC
Blood Product Expiration Date: 202109072359
Blood Product Expiration Date: 202109072359
ISSUE DATE / TIME: 202108261055
ISSUE DATE / TIME: 202108261249
Unit Type and Rh: 9500
Unit Type and Rh: 9500

## 2020-05-18 NOTE — Procedures (Signed)
  Procedure: R IJ tunneled SL CVC   EBL:   minimal Complications:  none immediate  See full dictation in [facility].  Dillard Cannon MD Main # [phone] Pager  [phone]

## 2020-05-18 NOTE — Discharge Instructions (Signed)
PICC Insertion  A peripherally inserted central catheter (PICC) is a form of IV access that allows medicines and IV fluids to be quickly distributed throughout the body. The PICC is a thin, flexible tube (catheter) that is inserted into a vein in your arm or leg. The PICC is guided through your veins until the tip sits in a large vein just outside your heart (superior vena cava or SVC). After the PICC is inserted, a chest X-ray may be done to make sure that it is in the correct place. Only a health care provider trained in PICC insertion will do the procedure. You may have a PICC placed:  To give medicines and nutrition.  To rapidly give fluids or blood products.  To take frequent blood samples.  If there is a problem placing a peripheral intravenous (PIV) catheter. Tell a health care provider about:  Any allergies you have.  All medicines you are taking, including vitamins, herbs, eye drops, creams, and over-the-counter medicines.  Any problems you or family members have had with anesthetic medicines.  Any blood disorders you have.  Any surgeries you have had.  Any medical conditions you have.  Whether you are pregnant or may be pregnant. What are the risks? Generally, this is a safe procedure. However, problems may occur, including:  Bleeding. This may happen at the insertion site or internally.  Infection. This may occur at the insertion site or in the blood.  Movement or malposition of the PICC.  Inflammation of the vein (phlebitis).  Nerve injury or irritation.  Clot formation at the tip of the PICC line.  Blood clot in the lung (pulmonary embolus).  Injury or collapse of the lung (pneumothorax).  Injury to the large blood vessels or the heart. This is rare. What happens before the procedure?  Ask your health care provider about: ? Changing or stopping your regular medicines. This is especially important if you are taking diabetes medicines or blood  thinners. ? Taking over-the-counter medicines, vitamins, herbs, and supplements. ? Taking medicines such as aspirin and ibuprofen. These medicines can thin your blood. Do not take these medicines unless your health care provider tells you to take them.  You may have blood tests. These tests can help tell how well your blood clots.  Plan to have someone take you home from the hospital or clinic.  Plan to have a responsible adult care for you for at least 24 hours after you leave the hospital or clinic. This is important.  Follow instructions from your health care provider about eating or drinking restrictions. What happens during the procedure?  To reduce your risk of infection: ? Your health care team will wash or sanitize their hands. ? Your skin will be washed with soap. ? Hair may be removed from the surgical area.  Your health care provider will identify a vein into which the PICC line will be inserted. This may be done using ultrasound or X-ray guidance (fluoroscopy).  You will be given one or more of the following: ? A medicine to help you relax (sedative). ? A medicine to numb the area (local anesthetic).  A tourniquet will be placed on your arm.  A small needle will be inserted into the vein and then a small guidewire will be advanced into the SVC.  The catheter will be advanced over the guidewire and moved into position. The guidewire will be removed.  The catheter will be flushed and blood will be drawn back to make sure it  is in the vein.  If this was done without X-ray guidance, an X-ray will be needed to make sure that the catheter tip is in the correct position.  Once the placement is confirmed, the PICC will be secured to the skin. This may be done with a device, such as tape, or sutures (stitches).  A bandage (dressing) will be placed over the PICC insertion site. The procedure may vary among health care providers and hospitals. What happens after the  procedure?  You will be told how to care for your PICC line.  Do not drive for 24 hours if you were given a sedative.  Your blood pressure, heart rate, breathing rate, and blood oxygen level will be monitored until the medicines you were given have worn off. Summary  A peripherally inserted central catheter (PICC) is a form of IV access that allows medicines and IV fluids to be quickly distributed throughout the body.  The PICC is a long, thin, flexible tube that is inserted into a vein in your arm or leg and then guided to a large vein (SVC) in your chest.  You will be instructed on how to care for your PICC line. This information is not intended to replace advice given to you by your health care provider. Make sure you discuss any questions you have with your health care provider. Document Revised: 08/21/2017 Document Reviewed: 11/03/2016 Elsevier Patient Education  2020 [employer].

## 2020-05-21 ENCOUNTER — Inpatient Hospital Stay: Payer: Medicare Other | Admitting: Family Medicine

## 2020-05-21 ENCOUNTER — Other Ambulatory Visit: Payer: Self-pay | Admitting: Hematology and Oncology

## 2020-05-21 ENCOUNTER — Encounter: Payer: Self-pay | Admitting: Hematology and Oncology

## 2020-05-21 ENCOUNTER — Inpatient Hospital Stay (HOSPITAL_BASED_OUTPATIENT_CLINIC_OR_DEPARTMENT_OTHER): Payer: Medicare Other | Admitting: Hematology and Oncology

## 2020-05-21 ENCOUNTER — Other Ambulatory Visit: Payer: Self-pay

## 2020-05-21 ENCOUNTER — Ambulatory Visit: Payer: Medicare Other

## 2020-05-21 ENCOUNTER — Telehealth: Payer: Self-pay

## 2020-05-21 ENCOUNTER — Inpatient Hospital Stay: Payer: Medicare Other

## 2020-05-21 VITALS — BP 123/60 | HR 75 | Temp 97.3°F | Resp 18 | Ht 72.0 in | Wt 169.0 lb

## 2020-05-21 VITALS — BP 115/67 | HR 73 | Resp 18

## 2020-05-21 DIAGNOSIS — N289 Disorder of kidney and ureter, unspecified: Secondary | ICD-10-CM

## 2020-05-21 DIAGNOSIS — C7951 Secondary malignant neoplasm of bone: Secondary | ICD-10-CM | POA: Diagnosis not present

## 2020-05-21 DIAGNOSIS — R17 Unspecified jaundice: Secondary | ICD-10-CM | POA: Diagnosis not present

## 2020-05-21 DIAGNOSIS — C9200 Acute myeloblastic leukemia, not having achieved remission: Secondary | ICD-10-CM

## 2020-05-21 DIAGNOSIS — N400 Enlarged prostate without lower urinary tract symptoms: Secondary | ICD-10-CM | POA: Diagnosis not present

## 2020-05-21 DIAGNOSIS — Z148 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: Secondary | ICD-10-CM

## 2020-05-21 DIAGNOSIS — D649 Anemia, unspecified: Secondary | ICD-10-CM | POA: Diagnosis not present

## 2020-05-21 DIAGNOSIS — Z5111 Encounter for antineoplastic chemotherapy: Secondary | ICD-10-CM | POA: Diagnosis not present

## 2020-05-21 DIAGNOSIS — K5090 Crohn's disease, unspecified, without complications: Secondary | ICD-10-CM | POA: Diagnosis not present

## 2020-05-21 DIAGNOSIS — I4510 Unspecified right bundle-branch block: Secondary | ICD-10-CM | POA: Diagnosis not present

## 2020-05-21 DIAGNOSIS — Z7189 Other specified counseling: Secondary | ICD-10-CM | POA: Diagnosis not present

## 2020-05-21 DIAGNOSIS — M109 Gout, unspecified: Secondary | ICD-10-CM | POA: Diagnosis not present

## 2020-05-21 DIAGNOSIS — E876 Hypokalemia: Secondary | ICD-10-CM | POA: Diagnosis not present

## 2020-05-21 DIAGNOSIS — D72819 Decreased white blood cell count, unspecified: Secondary | ICD-10-CM | POA: Diagnosis not present

## 2020-05-21 DIAGNOSIS — T451X5A Adverse effect of antineoplastic and immunosuppressive drugs, initial encounter: Secondary | ICD-10-CM | POA: Diagnosis not present

## 2020-05-21 LAB — CBC WITH DIFFERENTIAL/PLATELET
Abs Immature Granulocytes: 0.1 10*3/uL — ABNORMAL HIGH (ref 0.00–0.07)
Band Neutrophils: 5 %
Basophils Absolute: 0 10*3/uL (ref 0.0–0.1)
Basophils Relative: 0 %
Blasts: 8 %
Eosinophils Absolute: 0 10*3/uL (ref 0.0–0.5)
Eosinophils Relative: 0 %
HCT: 20.7 % — ABNORMAL LOW (ref 39.0–52.0)
Hemoglobin: 6.8 g/dL — ABNORMAL LOW (ref 13.0–17.0)
Lymphocytes Relative: 33 %
Lymphs Abs: 1.4 10*3/uL (ref 0.7–4.0)
MCH: 27.9 pg (ref 26.0–34.0)
MCHC: 32.9 g/dL (ref 30.0–36.0)
MCV: 84.8 fL (ref 80.0–100.0)
Monocytes Absolute: 0 10*3/uL — ABNORMAL LOW (ref 0.1–1.0)
Monocytes Relative: 1 %
Myelocytes: 3 %
Neutro Abs: 2.3 10*3/uL (ref 1.7–7.7)
Neutrophils Relative %: 50 %
Platelets: 281 10*3/uL (ref 150–400)
RBC: 2.44 MIL/uL — ABNORMAL LOW (ref 4.22–5.81)
RDW: 18 % — ABNORMAL HIGH (ref 11.5–15.5)
WBC: 4.2 10*3/uL (ref 4.0–10.5)
nRBC: 12.3 % — ABNORMAL HIGH (ref 0.0–0.2)
nRBC: 9 /100 WBC — ABNORMAL HIGH

## 2020-05-21 LAB — LACTATE DEHYDROGENASE: LDH: 1092 U/L — ABNORMAL HIGH (ref 98–192)

## 2020-05-21 LAB — COMPREHENSIVE METABOLIC PANEL
ALT: 6 U/L (ref 0–44)
AST: 42 U/L — ABNORMAL HIGH (ref 15–41)
Albumin: 2.4 g/dL — ABNORMAL LOW (ref 3.5–5.0)
Alkaline Phosphatase: 68 U/L (ref 38–126)
Anion gap: 8 (ref 5–15)
BUN: 27 mg/dL — ABNORMAL HIGH (ref 8–23)
CO2: 22 mmol/L (ref 22–32)
Calcium: 7.9 mg/dL — ABNORMAL LOW (ref 8.9–10.3)
Chloride: 99 mmol/L (ref 98–111)
Creatinine, Ser: 1.58 mg/dL — ABNORMAL HIGH (ref 0.61–1.24)
GFR calc Af Amer: 50 mL/min — ABNORMAL LOW (ref >60)
GFR calc non Af Amer: 43 mL/min — ABNORMAL LOW (ref >60)
Glucose, Bld: 103 mg/dL — ABNORMAL HIGH (ref 70–99)
Potassium: 3.1 mmol/L — ABNORMAL LOW (ref 3.5–5.1)
Sodium: 129 mmol/L — ABNORMAL LOW (ref 135–145)
Total Bilirubin: 1.4 mg/dL — ABNORMAL HIGH (ref 0.3–1.2)
Total Protein: 8.5 g/dL — ABNORMAL HIGH (ref 6.5–8.1)

## 2020-05-21 LAB — MAGNESIUM: Magnesium: 1.6 mg/dL — ABNORMAL LOW (ref 1.7–2.4)

## 2020-05-21 LAB — PREPARE RBC (CROSSMATCH)

## 2020-05-21 MED ORDER — VENETOCLAX 100 MG TABLET
ORAL_TABLET | Freq: Every day | ORAL | 1 refills | 30.00000 days | Status: CP
Start: 2020-05-21 — End: ?
  Filled 2020-05-28: qty 30, 30d supply, fill #0

## 2020-05-21 MED ORDER — HEPARIN SOD (PORK) LOCK FLUSH 100 UNIT/ML IV SOLN
250.0000 [IU] | Freq: Once | INTRAVENOUS | Status: AC | PRN
  Administered 2020-05-21: 250 [IU]
  Filled 2020-05-21: qty 5

## 2020-05-21 MED ORDER — ONDANSETRON HCL 4 MG PO TABS
8.0000 mg | ORAL_TABLET | Freq: Once | ORAL | Status: AC
  Administered 2020-05-21: 8 mg via ORAL
  Filled 2020-05-21: qty 2

## 2020-05-21 MED ORDER — SODIUM CHLORIDE 0.9 % IV SOLN
1.0000 g | Freq: Once | INTRAVENOUS | Status: AC
  Administered 2020-05-21: 1 g via INTRAVENOUS
  Filled 2020-05-21: qty 2

## 2020-05-21 MED ORDER — HEPARIN SOD (PORK) LOCK FLUSH 100 UNIT/ML IV SOLN
INTRAVENOUS | Status: AC
  Filled 2020-05-21: qty 5

## 2020-05-21 MED ORDER — AZACITIDINE CHEMO SQ INJECTION
78.5000 mg/m2 | Freq: Once | INTRAMUSCULAR | Status: AC
  Administered 2020-05-21: 150 mg via SUBCUTANEOUS
  Filled 2020-05-21: qty 6

## 2020-05-21 NOTE — Unmapped (Signed)
[facility] Refill Coordination Note    Specialty Medication(s) to be Shipped:   Hematology/Oncology: Venclexta    Other medication(s) to be shipped: valacyclovir 500mg      Howard Villarreal, DOB: 1945-10-11  Phone: [phone] (home) [phone] (work)      All above HIPAA information was verified with patient's wife, Howard Villarreal     Was a Nurse, learning disability used for this call? No    Completed refill call assessment today to schedule patient's medication shipment from the [facility] [phone]).       Specialty medication(s) and dose(s) confirmed: Patient reports changes to the regimen as follows: Dose has increased back to 4 tablets daily   Changes to medications: Makyi reports no changes at this time.  Changes to insurance: No  Questions for the pharmacist: No    Confirmed patient received Welcome Packet with first shipment. The patient will receive a drug information handout for each medication shipped and additional FDA Medication Guides as required.       DISEASE/MEDICATION-SPECIFIC INFORMATION        N/A    SPECIALTY MEDICATION ADHERENCE     Medication Adherence    Patient reported X missed doses in the last month: 0  Specialty Medication: Venclexta 100mg   Patient is on additional specialty medications: No  Informant: spouse                Venclexta 100 mg: 10 days of medicine on hand         SHIPPING     Shipping address confirmed in Epic.     Delivery Scheduled: Yes, Expected medication delivery date: 05/29/20.  However, Rx request for refills was sent to the provider as there are none remaining.     Medication will be delivered via Same Day Courier to the prescription address in Epic WAM.    [name]   [facility] Specialty Technician

## 2020-05-21 NOTE — Progress Notes (Signed)
No new changes noted today 

## 2020-05-21 NOTE — Progress Notes (Signed)
Verified labs with MD, ok to proceed with treatment today

## 2020-05-21 NOTE — Telephone Encounter (Signed)
Spoke with ms Ruben [name] to inform her, Per Dr Mike Gip she wou;ld like for the [patient to start back on the Ventaclax 100 mg daily, continue Posaconazole 300 mg daily and to start back on potassium 20 meq 1 tab po BID for today only and then 20 meq daily. Ms Ruben [name] was understanding and agreeable.

## 2020-05-22 ENCOUNTER — Inpatient Hospital Stay: Payer: Medicare Other

## 2020-05-22 ENCOUNTER — Other Ambulatory Visit: Payer: Medicare Other

## 2020-05-22 ENCOUNTER — Ambulatory Visit: Payer: Medicare Other

## 2020-05-22 ENCOUNTER — Ambulatory Visit: Payer: Medicare Other | Admitting: Hematology and Oncology

## 2020-05-22 VITALS — BP 127/64 | HR 74 | Temp 97.4°F | Resp 18

## 2020-05-22 DIAGNOSIS — N400 Enlarged prostate without lower urinary tract symptoms: Secondary | ICD-10-CM | POA: Diagnosis not present

## 2020-05-22 DIAGNOSIS — C9200 Acute myeloblastic leukemia, not having achieved remission: Secondary | ICD-10-CM

## 2020-05-22 DIAGNOSIS — Z5111 Encounter for antineoplastic chemotherapy: Secondary | ICD-10-CM | POA: Diagnosis not present

## 2020-05-22 DIAGNOSIS — I4510 Unspecified right bundle-branch block: Secondary | ICD-10-CM | POA: Diagnosis not present

## 2020-05-22 DIAGNOSIS — K5090 Crohn's disease, unspecified, without complications: Secondary | ICD-10-CM | POA: Diagnosis not present

## 2020-05-22 DIAGNOSIS — D649 Anemia, unspecified: Secondary | ICD-10-CM

## 2020-05-22 DIAGNOSIS — M109 Gout, unspecified: Secondary | ICD-10-CM | POA: Diagnosis not present

## 2020-05-22 MED ORDER — SODIUM CHLORIDE 0.9% FLUSH
3.0000 mL | INTRAVENOUS | Status: DC | PRN
  Filled 2020-05-22: qty 3

## 2020-05-22 MED ORDER — DIPHENHYDRAMINE HCL 25 MG PO CAPS
25.0000 mg | ORAL_CAPSULE | Freq: Once | ORAL | Status: AC
  Administered 2020-05-22: 25 mg via ORAL
  Filled 2020-05-22: qty 1

## 2020-05-22 MED ORDER — HEPARIN SOD (PORK) LOCK FLUSH 100 UNIT/ML IV SOLN
250.0000 [IU] | INTRAVENOUS | Status: AC | PRN
  Administered 2020-05-22: 250 [IU]
  Filled 2020-05-22: qty 5

## 2020-05-22 MED ORDER — SODIUM CHLORIDE 0.9% IV SOLUTION
250.0000 mL | Freq: Once | INTRAVENOUS | Status: AC
  Administered 2020-05-22: 250 mL via INTRAVENOUS
  Filled 2020-05-22: qty 250

## 2020-05-22 MED ORDER — ONDANSETRON HCL 4 MG PO TABS
8.0000 mg | ORAL_TABLET | Freq: Once | ORAL | Status: AC
  Administered 2020-05-22: 8 mg via ORAL
  Filled 2020-05-22: qty 2

## 2020-05-22 MED ORDER — AZACITIDINE CHEMO SQ INJECTION
150.0000 mg | Freq: Once | INTRAMUSCULAR | Status: AC
  Administered 2020-05-22: 150 mg via SUBCUTANEOUS
  Filled 2020-05-22: qty 6

## 2020-05-22 MED ORDER — ACETAMINOPHEN 325 MG PO TABS
650.0000 mg | ORAL_TABLET | Freq: Once | ORAL | Status: AC
  Administered 2020-05-22: 650 mg via ORAL
  Filled 2020-05-22: qty 2

## 2020-05-22 NOTE — Progress Notes (Signed)
UNMATCHED BLOOD PRODUCT NOTE  Compare the patient ID on the blood tag to the patient ID on the hospital armband and Blood Bank armband. Then confirm the unit number on the blood tag matches the unit number on the blood product.  If a discrepancy is discovered return the product to blood bank immediately.   Blood product: RBC  Unit #: [id number]  Product Code #: [id number]   Start Time: 2841  Starting Rate: 171m/hr  Rate increase/decreased  (if applicable):   2324  ml/hr  Rate changed time (if applicable): 14010  Stop Time: 12725  All Other Documentation should be documented within the Blood Admin Flowsheet per policy.

## 2020-05-22 NOTE — Progress Notes (Signed)
UNMATCHED BLOOD PRODUCT NOTE  Compare the patient ID on the blood tag to the patient ID on the hospital armband and Blood Bank armband. Then confirm the unit number on the blood tag matches the unit number on the blood product.  If a discrepancy is discovered return the product to blood bank immediately.   Blood Product Type RBC  Unit #: [id number]  Product Code #: [id number]   Start Time: 2867  Starting Rate: 120 ml/hr  Rate increase/decreased  (if applicable):230      ml/hr  Rate changed time 1210   Stop Time: 5198   All Other Documentation should be documented within the Blood Admin Flowsheet per policy.

## 2020-05-23 ENCOUNTER — Other Ambulatory Visit: Payer: Self-pay

## 2020-05-23 ENCOUNTER — Telehealth: Payer: Self-pay | Admitting: Hematology and Oncology

## 2020-05-23 ENCOUNTER — Inpatient Hospital Stay: Payer: Medicare Other | Admitting: Family Medicine

## 2020-05-23 ENCOUNTER — Inpatient Hospital Stay: Payer: Medicare Other | Attending: Hematology and Oncology

## 2020-05-23 DIAGNOSIS — K5090 Crohn's disease, unspecified, without complications: Secondary | ICD-10-CM | POA: Insufficient documentation

## 2020-05-23 DIAGNOSIS — R410 Disorientation, unspecified: Secondary | ICD-10-CM | POA: Insufficient documentation

## 2020-05-23 DIAGNOSIS — C9200 Acute myeloblastic leukemia, not having achieved remission: Secondary | ICD-10-CM | POA: Diagnosis not present

## 2020-05-23 DIAGNOSIS — R6881 Early satiety: Secondary | ICD-10-CM | POA: Diagnosis not present

## 2020-05-23 DIAGNOSIS — Z5111 Encounter for antineoplastic chemotherapy: Secondary | ICD-10-CM | POA: Diagnosis not present

## 2020-05-23 DIAGNOSIS — Z806 Family history of leukemia: Secondary | ICD-10-CM | POA: Diagnosis not present

## 2020-05-23 DIAGNOSIS — Z8042 Family history of malignant neoplasm of prostate: Secondary | ICD-10-CM | POA: Diagnosis not present

## 2020-05-23 DIAGNOSIS — I517 Cardiomegaly: Secondary | ICD-10-CM | POA: Insufficient documentation

## 2020-05-23 DIAGNOSIS — R630 Anorexia: Secondary | ICD-10-CM | POA: Insufficient documentation

## 2020-05-23 DIAGNOSIS — Z87891 Personal history of nicotine dependence: Secondary | ICD-10-CM | POA: Diagnosis not present

## 2020-05-23 DIAGNOSIS — M109 Gout, unspecified: Secondary | ICD-10-CM | POA: Insufficient documentation

## 2020-05-23 DIAGNOSIS — D45 Polycythemia vera: Secondary | ICD-10-CM | POA: Diagnosis not present

## 2020-05-23 DIAGNOSIS — J90 Pleural effusion, not elsewhere classified: Secondary | ICD-10-CM | POA: Insufficient documentation

## 2020-05-23 DIAGNOSIS — Z79899 Other long term (current) drug therapy: Secondary | ICD-10-CM | POA: Insufficient documentation

## 2020-05-23 DIAGNOSIS — R161 Splenomegaly, not elsewhere classified: Secondary | ICD-10-CM | POA: Diagnosis not present

## 2020-05-23 DIAGNOSIS — I4510 Unspecified right bundle-branch block: Secondary | ICD-10-CM | POA: Insufficient documentation

## 2020-05-23 DIAGNOSIS — Z803 Family history of malignant neoplasm of breast: Secondary | ICD-10-CM | POA: Diagnosis not present

## 2020-05-23 DIAGNOSIS — Z800 Family history of malignant neoplasm of digestive organs: Secondary | ICD-10-CM | POA: Diagnosis not present

## 2020-05-23 DIAGNOSIS — N2889 Other specified disorders of kidney and ureter: Secondary | ICD-10-CM | POA: Insufficient documentation

## 2020-05-23 DIAGNOSIS — R5383 Other fatigue: Secondary | ICD-10-CM | POA: Diagnosis not present

## 2020-05-23 DIAGNOSIS — E876 Hypokalemia: Secondary | ICD-10-CM | POA: Diagnosis not present

## 2020-05-23 DIAGNOSIS — C92A0 Acute myeloid leukemia with multilineage dysplasia, not having achieved remission: Secondary | ICD-10-CM | POA: Diagnosis not present

## 2020-05-23 DIAGNOSIS — N401 Enlarged prostate with lower urinary tract symptoms: Secondary | ICD-10-CM | POA: Insufficient documentation

## 2020-05-23 DIAGNOSIS — R05 Cough: Secondary | ICD-10-CM | POA: Insufficient documentation

## 2020-05-23 LAB — BPAM RBC
Blood Product Expiration Date: 202109202359
Blood Product Expiration Date: 202109212359
ISSUE DATE / TIME: 202108310935
ISSUE DATE / TIME: 202108311147
Unit Type and Rh: 5100
Unit Type and Rh: 5100

## 2020-05-23 LAB — TYPE AND SCREEN
ABO/RH(D): O POS
Antibody Screen: NEGATIVE
Unit division: 0
Unit division: 0

## 2020-05-23 MED ORDER — ONDANSETRON HCL 4 MG PO TABS: 8.0000 mg | ORAL_TABLET | Freq: Once | ORAL | Status: DC

## 2020-05-23 MED ORDER — AZACITIDINE CHEMO SQ INJECTION
78.5000 mg/m2 | Freq: Once | INTRAMUSCULAR | Status: AC
  Administered 2020-05-23: 150 mg via SUBCUTANEOUS
  Filled 2020-05-23: qty 6

## 2020-05-23 NOTE — Telephone Encounter (Signed)
Wife Ruben [name] came in with Ruben [name] upset and said she wanted to know why the nurses gave him nausea medication before his Vidaza inj yesterday 8/31. It caused him a bad reaction. He was sleepy and could hardly walk. I told the appropriate nurse.

## 2020-05-24 ENCOUNTER — Other Ambulatory Visit: Payer: Self-pay | Admitting: Hematology and Oncology

## 2020-05-24 ENCOUNTER — Other Ambulatory Visit: Payer: Self-pay

## 2020-05-24 ENCOUNTER — Inpatient Hospital Stay: Payer: Medicare Other

## 2020-05-24 ENCOUNTER — Inpatient Hospital Stay (HOSPITAL_BASED_OUTPATIENT_CLINIC_OR_DEPARTMENT_OTHER): Payer: Medicare Other | Admitting: Hematology and Oncology

## 2020-05-24 ENCOUNTER — Encounter: Payer: Self-pay | Admitting: Hematology and Oncology

## 2020-05-24 DIAGNOSIS — C9200 Acute myeloblastic leukemia, not having achieved remission: Secondary | ICD-10-CM

## 2020-05-24 DIAGNOSIS — R6881 Early satiety: Secondary | ICD-10-CM | POA: Diagnosis not present

## 2020-05-24 DIAGNOSIS — D649 Anemia, unspecified: Secondary | ICD-10-CM

## 2020-05-24 DIAGNOSIS — R17 Unspecified jaundice: Secondary | ICD-10-CM

## 2020-05-24 DIAGNOSIS — Z7189 Other specified counseling: Secondary | ICD-10-CM

## 2020-05-24 DIAGNOSIS — Z5111 Encounter for antineoplastic chemotherapy: Secondary | ICD-10-CM | POA: Diagnosis not present

## 2020-05-24 DIAGNOSIS — E876 Hypokalemia: Secondary | ICD-10-CM | POA: Diagnosis not present

## 2020-05-24 DIAGNOSIS — N289 Disorder of kidney and ureter, unspecified: Secondary | ICD-10-CM

## 2020-05-24 DIAGNOSIS — N2889 Other specified disorders of kidney and ureter: Secondary | ICD-10-CM | POA: Diagnosis not present

## 2020-05-24 DIAGNOSIS — D45 Polycythemia vera: Secondary | ICD-10-CM | POA: Diagnosis not present

## 2020-05-24 LAB — CBC WITH DIFFERENTIAL/PLATELET
Abs Immature Granulocytes: 0.3 10*3/uL — ABNORMAL HIGH (ref 0.00–0.07)
Band Neutrophils: 14 %
Basophils Absolute: 0 10*3/uL (ref 0.0–0.1)
Basophils Relative: 0 %
Blasts: 7 %
Eosinophils Absolute: 0 10*3/uL (ref 0.0–0.5)
Eosinophils Relative: 1 %
HCT: 23.7 % — ABNORMAL LOW (ref 39.0–52.0)
Hemoglobin: 7.9 g/dL — ABNORMAL LOW (ref 13.0–17.0)
Lymphocytes Relative: 20 %
Lymphs Abs: 0.7 10*3/uL (ref 0.7–4.0)
MCH: 29.2 pg (ref 26.0–34.0)
MCHC: 33.3 g/dL (ref 30.0–36.0)
MCV: 87.5 fL (ref 80.0–100.0)
Metamyelocytes Relative: 3 %
Monocytes Absolute: 0.1 10*3/uL (ref 0.1–1.0)
Monocytes Relative: 4 %
Myelocytes: 5 %
Neutro Abs: 2.2 10*3/uL (ref 1.7–7.7)
Neutrophils Relative %: 46 %
Platelets: 172 10*3/uL (ref 150–400)
RBC: 2.71 MIL/uL — ABNORMAL LOW (ref 4.22–5.81)
RDW: 17.8 % — ABNORMAL HIGH (ref 11.5–15.5)
WBC: 3.6 10*3/uL — ABNORMAL LOW (ref 4.0–10.5)
nRBC: 2 /100 WBC — ABNORMAL HIGH
nRBC: 4.7 % — ABNORMAL HIGH (ref 0.0–0.2)

## 2020-05-24 LAB — COMPREHENSIVE METABOLIC PANEL
ALT: 7 U/L (ref 0–44)
AST: 32 U/L (ref 15–41)
Albumin: 2.3 g/dL — ABNORMAL LOW (ref 3.5–5.0)
Alkaline Phosphatase: 63 U/L (ref 38–126)
Anion gap: 5 (ref 5–15)
BUN: 33 mg/dL — ABNORMAL HIGH (ref 8–23)
CO2: 23 mmol/L (ref 22–32)
Calcium: 7.9 mg/dL — ABNORMAL LOW (ref 8.9–10.3)
Chloride: 102 mmol/L (ref 98–111)
Creatinine, Ser: 1.44 mg/dL — ABNORMAL HIGH (ref 0.61–1.24)
GFR calc Af Amer: 55 mL/min — ABNORMAL LOW (ref >60)
GFR calc non Af Amer: 48 mL/min — ABNORMAL LOW (ref >60)
Glucose, Bld: 108 mg/dL — ABNORMAL HIGH (ref 70–99)
Potassium: 4.2 mmol/L (ref 3.5–5.1)
Sodium: 130 mmol/L — ABNORMAL LOW (ref 135–145)
Total Bilirubin: 1.4 mg/dL — ABNORMAL HIGH (ref 0.3–1.2)
Total Protein: 8.4 g/dL — ABNORMAL HIGH (ref 6.5–8.1)

## 2020-05-24 LAB — BASIC METABOLIC PANEL
Anion gap: 5 (ref 5–15)
BUN: 35 mg/dL — ABNORMAL HIGH (ref 8–23)
CO2: 23 mmol/L (ref 22–32)
Calcium: 8 mg/dL — ABNORMAL LOW (ref 8.9–10.3)
Chloride: 102 mmol/L (ref 98–111)
Creatinine, Ser: 1.45 mg/dL — ABNORMAL HIGH (ref 0.61–1.24)
GFR calc Af Amer: 55 mL/min — ABNORMAL LOW (ref >60)
GFR calc non Af Amer: 47 mL/min — ABNORMAL LOW (ref >60)
Glucose, Bld: 113 mg/dL — ABNORMAL HIGH (ref 70–99)
Potassium: 4.2 mmol/L (ref 3.5–5.1)
Sodium: 130 mmol/L — ABNORMAL LOW (ref 135–145)

## 2020-05-24 LAB — BILIRUBIN, DIRECT: Bilirubin, Direct: 0.2 mg/dL (ref 0.0–0.2)

## 2020-05-24 MED ORDER — AZACITIDINE CHEMO SQ INJECTION
150.0000 mg | Freq: Once | INTRAMUSCULAR | Status: AC
  Administered 2020-05-24: 150 mg via SUBCUTANEOUS
  Filled 2020-05-24: qty 6

## 2020-05-24 NOTE — Unmapped (Signed)
Hi,     Courtney from [facility] contacted the Communication Center requesting to speak with the care team of Howard Villarreal to discuss:    Toni Amend called to inform [name] that Dr. Merlene Pulling would like to discuss patient with her.    Please contact Courtney at [phone].        Check Indicates criteria has been reviewed and confirmed with the patient:    [x]  Preferred Name   [x]  DOB and/or MR#  [x]  Preferred Contact Method  [x]  Phone Number(s)   []  MyChart     Thank you,   Jacques Navy  [facility]   [phone]

## 2020-05-24 NOTE — Progress Notes (Signed)
[facility]  [address] [place], [place] [address] Phone: [phone]  Fax: [phone]   Clinic Day:  05/24/2020  Referring physician: [name], MD  Chief Complaint: Ruben [name] is a 74 y.o. 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  HPI: The patient was last seen in the was last seen in the hematology clinic on 05/21/2020.  At that time, he felt fatigued.  He denied any pain.  He had a chronic dry cough felt secondary to allergies.  Exam revealed marked splenomegaly. Hematocrit was 20.7, hemoglobin 6.8, MCV 84.8, platelets 281,000, WBC 4,200. Sodium was 129. Potassium was 3.1. Creatinine was 1.58 (CrCl 43 ml/min). Calcium was 7.9. Albumin was 2.4. Total bilirubin was 1.4. Magnesium was 1.6. LDH was 1,092. He received day 1 of cycle #2 azacitidine and venetoclax. He received 1g IV magnesium.  He received 2 units of PRBCs on 05/22/2020.    He has continued daily oral venetoclax.  He has received azacitidine SQ daily in clinic.  During the interim, he has felt "about the same".  He spends a lot of time resting.  He denies any fevers.  He denies any bleeding.  He wishes to continue treatment.   Past Medical History:  Diagnosis Date  . Blood dyscrasia   . BPH (benign prostatic hyperplasia)   . Cancer ([place])    SKIN/ POLYCYTHEMIA VERA  . Chronic kidney disease    RENAL INSUFF (40%)  . Crohn disease ([place])   . Crohn's disease ([place])   . Glaucoma   . Glaucoma   . Gout   . History of chicken pox   . Myelofibrosis ([place])   . Nosebleed   . RBBB   . Right bundle branch block     Past Surgical History:  Procedure Laterality Date  . CATARACT EXTRACTION W/PHACO Left 07/13/2018   Procedure: CATARACT EXTRACTION PHACO AND INTRAOCULAR LENS PLACEMENT (IOC);  Surgeon: [name], MD;  Location: [facility] ORS;  Service: Ophthalmology;   Laterality: Left;  [place] 00:39  CDE 5.14 Fluid pack lot # [id number]  . CATARACT EXTRACTION W/PHACO Right 08/03/2018   Procedure: CATARACT EXTRACTION PHACO AND INTRAOCULAR LENS PLACEMENT (IOC);  Surgeon: [name], MD;  Location: [facility] ORS;  Service: Ophthalmology;  Laterality: Right;  [place] 00:43.4 CDE 5.23 Fluid pack Lot # [id number]  . COLONOSCOPY WITH PROPOFOL N/A 03/27/2017   Procedure: COLONOSCOPY WITH PROPOFOL;  Surgeon: [name], MD;  Location: [facility] ENDOSCOPY;  Service: Endoscopy;  Laterality: N/A;  . IR FLUORO GUIDE CV LINE LEFT  05/18/2020  . MOHS SURGERY     EAR  . PROSTATE SURGERY  2008   Prostate Biopsy in [place] to elevated PSA.  per patient normal  . Skin Lesion Basal cell removed    . wart removal     from eyelid    Family History  Problem Relation Age of Onset  . Cancer Mother        Liver  . Cancer Father        Prostate  . Cancer Sister        Hodgkins lymphoma  . Cancer Paternal Aunt        Breast    Social History:  reports that he quit smoking about 16 years ago. His smoking use included cigarettes. He has a 5.00 pack-year smoking history. He has never used smokeless tobacco. He reports previous alcohol  use of about 4.0 - 5.0 standard drinks of alcohol per week. He reports that he does not use drugs. The patientpreviously worked for [employer]. He no longer volunteers for [employer]. The patient's wife isJudith([phone]).He lives in [place]. The patient is accompanied by [name] today.  Allergies: No Known Allergies  Current Medications: Current Outpatient Medications  Medication Sig Dispense Refill  . acetaminophen (TYLENOL) 500 MG tablet Take 500 mg by mouth daily as needed for moderate pain.    Marland Kitchen allopurinol (ZYLOPRIM) 300 MG tablet TAKE 1/2 TABLET (150 MG TOTAL) BY MOUTH DAILY. 45 tablet 1  . Cyanocobalamin (RA VITAMIN B-12 TR) 1000 MCG TBCR Take 1,000 mcg by mouth daily at 12 noon.     . cyanocobalamin 1000 MCG tablet  Take by mouth. (Patient not taking: Reported on 05/02/2020)    . finasteride (PROSCAR) 5 MG tablet Take 1 tablet (5 mg total) by mouth every evening. 90 tablet 3  . folic acid (FOLVITE) 1 MG tablet Take 1 mg by mouth daily at 12 noon.     . latanoprost (XALATAN) 0.005 % ophthalmic solution Place 1 drop into both eyes at bedtime.     Marland Kitchen levofloxacin (LEVAQUIN) 250 MG tablet Take 250 mg by mouth daily.    . midodrine (PROAMATINE) 5 MG tablet Take 1 tablet (5 mg total) by mouth 3 (three) times daily with meals. 90 tablet 0  . POSACONAZOLE PO Take 300 mg by mouth daily.    . potassium chloride (KLOR-CON) 10 MEQ tablet Take as directed by MD. 30 tablet 0  . potassium chloride SA (KLOR-CON) 20 MEQ tablet Take 1 tablet (20 mEq total) by mouth daily. 30 tablet 0  . sodium bicarbonate 650 MG tablet Take 650 mg by mouth 2 (two) times daily.    . timolol (TIMOPTIC) 0.5 % ophthalmic solution Place 1 drop into both eyes daily. In AM  5  . torsemide (DEMADEX) 20 MG tablet Take 1 tablet (20 mg total) by mouth daily. (Patient not taking: Reported on 05/15/2020) 30 tablet 0  . tranexamic acid (LYSTEDA) 650 MG TABS tablet Take 650 mg by mouth 2 (two) times daily. (Patient not taking: Reported on 05/15/2020)    . valACYclovir (VALTREX) 500 MG tablet Take 500 mg by mouth daily.     No current facility-administered medications for this visit.   Facility-Administered Medications Ordered in Other Visits  Medication Dose Route Frequency Provider Last Rate Last Admin  . azaCITIDine (VIDAZA) chemo injection 150 mg  150 mg Subcutaneous Once Lequita Asal, MD        Review of Systems  Constitutional: Positive for malaise/fatigue (spends much of day resting). Negative for chills, diaphoresis, fever and weight loss (no new weight).       Feels "about the same".  HENT: Negative.  Negative for congestion, ear discharge, ear pain, nosebleeds, sinus pain, sore throat and tinnitus.   Eyes: Negative.  Negative for blurred  vision, double vision, photophobia and pain.  Respiratory: Positive for cough (dry) and shortness of breath (with exertion). Negative for hemoptysis and sputum production.   Cardiovascular: Negative.  Negative for chest pain, palpitations, leg swelling and PND.  Gastrointestinal: Negative for abdominal pain, blood in stool, constipation, diarrhea (intermittent; small volume), heartburn, melena, nausea and vomiting.       Early satiety. Decreased appetite. Drinking Boost.  Genitourinary: Negative.  Negative for dysuria, frequency, hematuria and urgency.       I/O catheterization.  Musculoskeletal: Negative.  Negative for back  pain and myalgias.  Skin: Negative.  Negative for itching and rash.  Neurological: Positive for weakness (general). Negative for tingling, tremors, focal weakness and headaches.  Endo/Heme/Allergies: Negative.  Does not bruise/bleed easily.  Psychiatric/Behavioral: Negative for depression and memory loss. The patient is not nervous/anxious and does not have insomnia.   All other systems reviewed and are negative.  Performance status (ECOG): 2  Vitals There were no vitals taken for this visit.   Physical Exam Nursing note reviewed.  Constitutional:      General: He is not in acute distress.    Appearance: He is not diaphoretic.     Comments: Chronically fatigued gentleman sitting in a wheelchair in no acute distress.  HENT:     Head: Normocephalic and atraumatic.     Comments: Thin gray hair with temporal wasting.    Mouth/Throat:     Mouth: Mucous membranes are moist.     Pharynx: Oropharynx is clear. No oropharyngeal exudate or posterior oropharyngeal erythema.  Eyes:     General: No scleral icterus.    Extraocular Movements: Extraocular movements intact.     Conjunctiva/sclera: Conjunctivae normal.     Pupils: Pupils are equal, round, and reactive to light.  Cardiovascular:     Rate and Rhythm: Normal rate and regular rhythm.     Heart sounds: Normal heart  sounds.  Pulmonary:     Effort: Pulmonary effort is normal. No respiratory distress.     Breath sounds: Normal breath sounds. No wheezing or rales.  Chest:     Comments: Right upper chest PICC line with an unremarkable exit site. Abdominal:     General: There is no distension.     Palpations: There is splenomegaly (stable). There is no hepatomegaly or mass.     Tenderness: There is no abdominal tenderness. There is no guarding or rebound.  Musculoskeletal:        General: No swelling or tenderness.     Cervical back: Normal range of motion and neck supple.  Lymphadenopathy:     Head:     Right side of head: No preauricular, posterior auricular or occipital adenopathy.     Left side of head: No preauricular, posterior auricular or occipital adenopathy.     Cervical: No cervical adenopathy.     Upper Body:     Right upper body: No supraclavicular or axillary adenopathy.     Left upper body: No supraclavicular or axillary adenopathy.  Skin:    General: Skin is warm and dry.  Neurological:     Mental Status: He is alert and oriented to person, place, and time.  Psychiatric:        Behavior: Behavior normal.        Thought Content: Thought content normal.        Judgment: Judgment normal.    Infusion on 05/24/2020  Component Date Value Ref Range Status  . Sodium 05/24/2020 130* 135 - 145 mmol/L Final  . Potassium 05/24/2020 4.2  3.5 - 5.1 mmol/L Final  . Chloride 05/24/2020 102  98 - 111 mmol/L Final  . CO2 05/24/2020 23  22 - 32 mmol/L Final  . Glucose, Bld 05/24/2020 113* 70 - 99 mg/dL Final   Glucose reference range applies only to samples taken after fasting for at least 8 hours.  . BUN 05/24/2020 35* 8 - 23 mg/dL Final  . Creatinine, Ser 05/24/2020 1.45* 0.61 - 1.24 mg/dL Final  . Calcium 05/24/2020 8.0* 8.9 - 10.3 mg/dL Final  . GFR  calc non Af Amer 05/24/2020 47* >60 mL/min Final  . GFR calc Af Amer 05/24/2020 55* >60 mL/min Final  . Anion gap 05/24/2020 5  5 - 15 Final     Performed at [facility], [address], [place], [place] [address]  . WBC 05/24/2020 PENDING  4.0 - 10.5 K/uL Incomplete  . RBC 05/24/2020 2.71* 4.22 - 5.81 MIL/uL Final  . Hemoglobin 05/24/2020 7.9* 13.0 - 17.0 g/dL Final  . HCT 05/24/2020 23.7* 39 - 52 % Final  . MCV 05/24/2020 87.5  80.0 - 100.0 fL Final  . MCH 05/24/2020 29.2  26.0 - 34.0 pg Final  . MCHC 05/24/2020 33.3  30.0 - 36.0 g/dL Final  . RDW 05/24/2020 17.8* 11.5 - 15.5 % Final  . Platelets 05/24/2020 172  150 - 400 K/uL Final   Performed at [facility], [address] [place], [place] [address]  . nRBC 05/24/2020 PENDING  0.0 - 0.2 % Incomplete  . Neutrophils Relative % 05/24/2020 PENDING  % Incomplete  . Neutro Abs 05/24/2020 PENDING  1.7 - 7.7 K/uL Incomplete  . Band Neutrophils 05/24/2020 PENDING  % Incomplete  . Lymphocytes Relative 05/24/2020 PENDING  % Incomplete  . Lymphs Abs 05/24/2020 PENDING  0.7 - 4.0 K/uL Incomplete  . Monocytes Relative 05/24/2020 PENDING  % Incomplete  . Monocytes Absolute 05/24/2020 PENDING  0 - 1 K/uL Incomplete  . Eosinophils Relative 05/24/2020 PENDING  % Incomplete  . Eosinophils Absolute 05/24/2020 PENDING  0 - 0 K/uL Incomplete  . Basophils Relative 05/24/2020 PENDING  % Incomplete  . Basophils Absolute 05/24/2020 PENDING  0 - 0 K/uL Incomplete  . WBC Morphology 05/24/2020 PENDING   Incomplete  . RBC Morphology 05/24/2020 PENDING   Incomplete  . Smear Review 05/24/2020 PENDING   Incomplete  . Other 05/24/2020 PENDING  % Incomplete  . nRBC 05/24/2020 PENDING  0 /100 WBC Incomplete  . Metamyelocytes Relative 05/24/2020 PENDING  % Incomplete  . Myelocytes 05/24/2020 PENDING  % Incomplete  . Promyelocytes Relative 05/24/2020 PENDING  % Incomplete  . Blasts 05/24/2020 PENDING  % Incomplete  . Immature Granulocytes 05/24/2020 PENDING  % Incomplete  . Abs Immature Granulocytes 05/24/2020 PENDING  0.00 - 0.07 K/uL Incomplete    Assessment:  Ruben [name] is a 74 y.o. 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 He has had polycythemia dating back to 2013. Hematocrit was 62.1 with a hemoglobin of 19.8 on 03/28/2015. JAK 2 testing on 03/28/2015 revealed [id number] mutation. Erythropoietin level was 1.1 (low). 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  He began a phlebotomyprogram on 03/28/2015 to maintain a hematocrit goal of <45.Last phlebotomywason 05/02/2015. He is on a baby aspirin.  He was initially on hydroxyurea beginning in 11/2015 then [facility].  Bone marrowon 12/14/2015 revealed a persistent myeloproliferative neoplasm with myelofibrosis and alterations compatible with myelodysplatic progression. Marrow was packed (95-100% cellularity) with pan myelosis, multi-lineage dyspoiesis, and no significant increase in blasts. There was moderate to focally marked reticulin fibrosis (grade 2-3/3). Storage iron was not identified. Flow cytometry revealed non-specific atypical myeloid findings with no increase in blasts. 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 Cytogenetics were normal (46, XY).  [facility] revealed a hypercellular marrow (>95%) with persistent involvement by myeloid neoplasm with 5% blasts. There was mild reticulin fibrosis. FISH t(9;22) results were normal. Myeloid mutation panel revealed JAK2 V617F, IDH2, RUNX1, and SRSF2 consistent with clonal  evolution. Cytogenetics are pending.  Bone marrowon 08/09/2018 revealed a hypercellular bone marrow (>95%) with persistent involvement by myeloid neoplasm with <1% blasts by manual touch preparation differential. There was marked reticulin fibrosis with focal collagen deposition. Cytogenetics were normal (46, XY). - Myeloid mutation panel study is pending.  Bone marrowon 07/14/2019 revealed a hypercellular marrow (>95%) with persistent  involvement by a myeloid neoplasm with extensive fibrosis and rare (< 5%) CD34 +blast byIHC. Myeloid mutation panel revealed the patient's previously reported variants inIDH2, JAK2, KRAS, NRAS and SRSF2 were again identified. There are no new variants. Cytogenetics are pending.  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  Bone marrow on 05/08/2020 revealed persistent disease, but with 50% reduction in blasts (20% to 5-10%).   He was admitted to [facility] from 03/28/2020 - 04/04/2020 forcycle #1azacitidine and venetoclax.  Day #1 was 04/01/2020. He tolerated treatment well, with the exception of mild tumor lysis syndrome, diarrhea, epistaxis x 1, and mild hyponatremia. He completed a 5 day course ofceftriaxone andazithromycin forpneumonia.  He is currently day 40 s/p cycle #1 azacytidine and venetoclax.  Peripheral smear on 05/02/2020 showed absolute leukopenia, with circulating blasts, 6% by manual differential count.  He had a normocytic anemia, with few nucleated RBCs.  There was thrombocytopenia, with unremarkable morphology.  Findings were compatible with treatment for acute leukemia.   He is currently day 4 of cycle #2 azacitidine and venetoclax.  He is RBC transfusion dependent.  He receives PRBCs twice weekly.   He has urinary retention. Renal ultrasoundon 07/28/2016 revealed bilateral hydronephrosis (right >left) and a large postvoid residual (2 liters) suggesting bladder dysfunction or outlet obstruction. He underwent temporary Foley catheter placement. He performs I/O self catheterizations. He is on Flomax and finasteride. PSAwas 2.02 on 07/09/2016.  He is followed by GI (Dr. Vira Agar) for a history of polypsand Crohn's disease.He had an unremarkable colonoscopy in 03/2017.AbdomenandpelvisCTon 04/03/2019 revealed mild wall thickening of portions of a loop of small bowel within the pelvis with intervening areas of mildly dilated small bowel,consistent with active Crohn's  disease. No evidence of an abscess or fistula. No other evidence of bowel inflammation.There was splenomegaly(16.7 x 7.7 x 15.6 cm), developed since12/11/2011.There was mild bladder wall thickeningandenlargement of the prostate. Chronic bladder outlet obstructionwas felt to bethe etiology of bladder wall thickening.  He has chronicrenal insufficiency(Cr 1.85; CrCl 36 ml/min). SPEP on 09/23/2016 was negative. Spot urine revealed 17.4% of 27.3 mg/dL of a monoclonal protein. 24 hour urine on 10/16/2016 revealed no monoclonal protein. Renal function transiently decreased in 06/2017 after Bactrim. 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  He has a history of epistaxis. Normal studies included: PT, PTT, von Willebrand panel, and platelet function assay (PFA).  He has right foot drop. 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 There was no non-contrast MRI evidence of brain metastasis. There was only mild for age nonspecific cerebral white signal changes. Lumbar spine MRIon 06/09/2019 showed diffusely abnormal marrow signal in the visible skeleton, as seen on the brain MRI. There was no pathologic fracture and no extraosseous extension of tumor on the lumbar spine or visible sacrum. In conjunction with the splenomegaly by CT in [place] infiltration byleukemia/lymphoma may be the most likely etiology. Differential considerations included multiple myeloma and metastatic disease unknown primary. The involvement of the pelvis should be amenable to bone marrow biopsy.There was superimposed mild for age lumbar spine degeneration with no spinal stenosis.There was mild degenerative L4 and L5 neural foraminal  stenosis.  He has had a history of pneumoniasince 02/2020.  He was admitted to [facility] from 04/25/2020 - 04/30/2020 for sepsis due to pneumonia. He was treated with IV fluids and  antibiotics. He received a total of 2 units of PRBCs (last 04/30/2020) for severe anemia. Hospital course was complicated by acute hypoxemic respiratory failure and pulmonary edema requiring BiPAP therapy and ICU transfer. He was treated for hypokalemia, hypomagnesemia, and hyponatremia.   Patient has a normocytic anemia.  He received 2 units of PRBCs at [facility] on 05/08/2020.   He received the Moderna COVID-19vaccineon 10/28/2019 and 11/29/2019. COVID-19 testing was negative on 02/29/2020.  Symptomatically, he is fatigued.  He denies any complaint.  Exam is stable.  Plan: 1.   Labs today: CBC with diff, CMP, direct bilirubin, type and screen.. 2.   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 He is day 4 of cycle #2 azacitidine + Venetoclax. Review plan for day 5 azacitidine in [facility] tomorrow followed by day 6-7 on Tuesday and Wednesday (after Labor Day).   Ensure patient taking venetoclax 100 mg a day.  Continue prophylactic antibiotics:    Posoconazole 300 mg po q day.                         Levaquin 250 mg po q day.                         Valacyclovir 500 mg po q day.  Labs reviewed.  Day 4 azacitidine today.  Review plan for bone marrow at [facility] on 06/13/2020. 3. Transfusion dependent anemia Hematocrit 23.7.  Hemoglobin 7.9.  MCV 87.5.  Platelets 172,000.  WBC 3600 with an ANC of 2200.  Patient has symptomatic anemia secondary to chemotherapy and AML.    Continue labs every Monday and Thursday with transfusion on Tuesdays and Fridays.             Maintain Hgb >8.0.  Transfuse 2 units of irradiated leukopoor PRBCs tomorrow in [place]. 4. Renal insufficiency and hypokalemia             Creatinine 1.44.  Potassium 4.2.  Patient continues I/O catheterization.    Decrease potassium to 20 mEq QOD. 5.   Supportive care  Discuss obtaining needed resources for patient and his wife.  Discuss  patient's goals and current symptoms.  Patient has home health.  Discuss referral to Billey Chang, NP for palliative care services.   Patient can be seen on Fridays when he is in [place].   Contact Josh Borders, NP directly- done.  Discuss importance of maintaining nutrition.  Support offered patient and his wife.  Encouraged patient's wife to take time for herself when he is in [place] or [place] receiving treatment.  Several questions asked and answered. 6.   Consult Palliative Care Altha Harm, NP). 7.   RCT as previously scheduled.   I discussed the assessment and treatment plan with the patient.  The patient was provided an opportunity to ask questions and all were answered.  The patient agreed with the plan and demonstrated an understanding of the instructions.  The patient was advised to call back if the symptoms worsen or if the condition fails to improve as anticipated.  I provided 20  minutes of face-to-face time during this encounter and > 50% was spent counseling as documented under my assessment and plan.  An additional 10+ minutes were spent reviewing his chart (  Epic and Care Everywhere) including notes, labs, writing orders for transfusion, and discussing pertinent with Altha Harm, NP.Lequita Asal, MD, PhD    05/24/2020, 3:24 PM  I, Mirian Mo Tufford, am acting as Education administrator for [employer]. Mike Gip, MD, PhD.  I, Hatsue Sime C. Mike Gip, MD, have reviewed the above documentation for accuracy and completeness, and I agree with the above.

## 2020-05-25 ENCOUNTER — Other Ambulatory Visit: Payer: Self-pay

## 2020-05-25 ENCOUNTER — Inpatient Hospital Stay: Payer: Medicare Other

## 2020-05-25 ENCOUNTER — Other Ambulatory Visit: Payer: Self-pay | Admitting: *Deleted

## 2020-05-25 ENCOUNTER — Encounter: Payer: Self-pay | Admitting: Hospice and Palliative Medicine

## 2020-05-25 ENCOUNTER — Inpatient Hospital Stay (HOSPITAL_BASED_OUTPATIENT_CLINIC_OR_DEPARTMENT_OTHER): Payer: Medicare Other | Admitting: Hospice and Palliative Medicine

## 2020-05-25 VITALS — BP 134/74 | HR 75 | Temp 97.9°F | Wt 170.5 lb

## 2020-05-25 VITALS — BP 148/84 | HR 80 | Temp 97.4°F | Resp 18

## 2020-05-25 DIAGNOSIS — Z515 Encounter for palliative care: Secondary | ICD-10-CM | POA: Diagnosis not present

## 2020-05-25 DIAGNOSIS — D45 Polycythemia vera: Secondary | ICD-10-CM | POA: Diagnosis not present

## 2020-05-25 DIAGNOSIS — R6881 Early satiety: Secondary | ICD-10-CM | POA: Diagnosis not present

## 2020-05-25 DIAGNOSIS — C9200 Acute myeloblastic leukemia, not having achieved remission: Secondary | ICD-10-CM | POA: Diagnosis not present

## 2020-05-25 DIAGNOSIS — D649 Anemia, unspecified: Secondary | ICD-10-CM

## 2020-05-25 DIAGNOSIS — E876 Hypokalemia: Secondary | ICD-10-CM | POA: Diagnosis not present

## 2020-05-25 DIAGNOSIS — Z5111 Encounter for antineoplastic chemotherapy: Secondary | ICD-10-CM | POA: Diagnosis not present

## 2020-05-25 DIAGNOSIS — N2889 Other specified disorders of kidney and ureter: Secondary | ICD-10-CM | POA: Diagnosis not present

## 2020-05-25 LAB — CBC WITH DIFFERENTIAL/PLATELET
Abs Immature Granulocytes: 0.4 10*3/uL — ABNORMAL HIGH (ref 0.00–0.07)
Band Neutrophils: 3 %
Basophils Absolute: 0 10*3/uL (ref 0.0–0.1)
Basophils Relative: 0 %
Blasts: 4 %
Eosinophils Absolute: 0 10*3/uL (ref 0.0–0.5)
Eosinophils Relative: 0 %
HCT: 22.6 % — ABNORMAL LOW (ref 39.0–52.0)
Hemoglobin: 7.7 g/dL — ABNORMAL LOW (ref 13.0–17.0)
Lymphocytes Relative: 23 %
Lymphs Abs: 0.8 10*3/uL (ref 0.7–4.0)
MCH: 29.1 pg (ref 26.0–34.0)
MCHC: 34.1 g/dL (ref 30.0–36.0)
MCV: 85.3 fL (ref 80.0–100.0)
Metamyelocytes Relative: 7 %
Monocytes Absolute: 0.4 10*3/uL (ref 0.1–1.0)
Monocytes Relative: 11 %
Myelocytes: 3 %
Neutro Abs: 1.9 10*3/uL (ref 1.7–7.7)
Neutrophils Relative %: 49 %
Platelets: 184 10*3/uL (ref 150–400)
RBC: 2.65 MIL/uL — ABNORMAL LOW (ref 4.22–5.81)
RDW: 18 % — ABNORMAL HIGH (ref 11.5–15.5)
Smear Review: ADEQUATE
WBC: 3.6 10*3/uL — ABNORMAL LOW (ref 4.0–10.5)
nRBC: 3.8 % — ABNORMAL HIGH (ref 0.0–0.2)

## 2020-05-25 LAB — PREPARE RBC (CROSSMATCH)

## 2020-05-25 MED ORDER — HEPARIN SOD (PORK) LOCK FLUSH 100 UNIT/ML IV SOLN
INTRAVENOUS | Status: AC
  Filled 2020-05-25: qty 5

## 2020-05-25 MED ORDER — SODIUM CHLORIDE 0.9% IV SOLUTION
250.0000 mL | Freq: Once | INTRAVENOUS | Status: AC
  Administered 2020-05-25: 250 mL via INTRAVENOUS
  Filled 2020-05-25: qty 250

## 2020-05-25 MED ORDER — SODIUM CHLORIDE 0.9% FLUSH
10.0000 mL | INTRAVENOUS | Status: AC | PRN
  Administered 2020-05-25: 10 mL
  Filled 2020-05-25: qty 10

## 2020-05-25 MED ORDER — AZACITIDINE CHEMO SQ INJECTION
78.5000 mg/m2 | Freq: Once | INTRAMUSCULAR | Status: AC
  Administered 2020-05-25: 150 mg via SUBCUTANEOUS
  Filled 2020-05-25: qty 6

## 2020-05-25 MED ORDER — ACETAMINOPHEN 325 MG PO TABS
650.0000 mg | ORAL_TABLET | Freq: Once | ORAL | Status: AC
  Administered 2020-05-25: 650 mg via ORAL
  Filled 2020-05-25: qty 2

## 2020-05-25 MED ORDER — DIPHENHYDRAMINE HCL 25 MG PO CAPS
25.0000 mg | ORAL_CAPSULE | Freq: Once | ORAL | Status: AC
  Administered 2020-05-25: 25 mg via ORAL
  Filled 2020-05-25: qty 1

## 2020-05-25 MED ORDER — HEPARIN SOD (PORK) LOCK FLUSH 100 UNIT/ML IV SOLN
250.0000 [IU] | INTRAVENOUS | Status: AC | PRN
  Administered 2020-05-25: 250 [IU]
  Filled 2020-05-25: qty 5

## 2020-05-25 NOTE — Progress Notes (Signed)
Ruben [name]  [phone] [phone]   Name: Ruben [name] Date: 05/25/2020 MRN: [id number]  DOB: Jan 30, 1946  Patient Care Team: [name], MD as PCP - General (Family Medicine) [name], MD (Inactive) (Gastroenterology) Dingeldein, Remo Lipps, MD (Ophthalmology) Lequita Asal, MD as Referring Physician (Hematology and Oncology) Dasher, Rayvon Char, MD as Referring Physician (Dermatology) Anthonette Legato, MD as Consulting Physician (Internal Medicine) Hollice Espy, MD as Consulting Physician (Urology) Crissie Sickles, MD as Referring Physician (Hematology and Oncology) Vladimir Crofts, MD as Consulting Physician (Neurology)    REASON FOR CONSULTATION: Ruben [name] is a 74 y.o. 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  He has had progressive decline with fatigue and poor oral intake.  Patient was referred to palliative care to help address goals and manage ongoing symptoms.  SOCIAL HISTORY:     reports that he quit smoking about 16 years ago. His smoking use included cigarettes. He has a 5.00 pack-year smoking history. He has never used smokeless tobacco. He reports previous alcohol use of about 4.0 - 5.0 standard drinks of alcohol per week. He reports that he does not use drugs.   Patient is married and lives at home with his wife.  He has 2 adult children, one of whom lives in [place] and the other lives locally.  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  ADVANCE DIRECTIVES:  On file  CODE STATUS:   PAST MEDICAL HISTORY: Past Medical History:  Diagnosis Date  . Blood dyscrasia   . BPH (benign prostatic hyperplasia)   . Cancer ([place])    SKIN/ POLYCYTHEMIA VERA  . Chronic kidney disease    RENAL INSUFF (40%)  . Crohn disease ([place])   .  Crohn's disease ([place])   . Glaucoma   . Glaucoma   . Gout   . History of chicken pox   . Myelofibrosis ([place])   . Nosebleed   . RBBB   . Right bundle branch block     PAST SURGICAL HISTORY:  Past Surgical History:  Procedure Laterality Date  . CATARACT EXTRACTION W/PHACO Left 07/13/2018   Procedure: CATARACT EXTRACTION PHACO AND INTRAOCULAR LENS PLACEMENT (IOC);  Surgeon: [name], MD;  Location: [facility] ORS;  Service: Ophthalmology;  Laterality: Left;  [place] 00:39  CDE 5.14 Fluid pack lot # [id number]  . CATARACT EXTRACTION W/PHACO Right 08/03/2018   Procedure: CATARACT EXTRACTION PHACO AND INTRAOCULAR LENS PLACEMENT (IOC);  Surgeon: [name], MD;  Location: [facility] ORS;  Service: Ophthalmology;  Laterality: Right;  [place] 00:43.4 CDE 5.23 Fluid pack Lot # [id number]  . COLONOSCOPY WITH PROPOFOL N/A 03/27/2017   Procedure: COLONOSCOPY WITH PROPOFOL;  Surgeon: [name], MD;  Location: [facility] ENDOSCOPY;  Service: Endoscopy;  Laterality: N/A;  . IR FLUORO GUIDE CV LINE LEFT  05/18/2020  . MOHS SURGERY     EAR  . PROSTATE SURGERY  2008   Prostate Biopsy in [place] to elevated PSA.  per patient normal  . Skin Lesion Basal cell removed    . wart removal     from eyelid    HEMATOLOGY/ONCOLOGY HISTORY:  Oncology History  Acute myeloid leukemia not having achieved remission ([place])  03/07/2020 Initial Diagnosis   Acute myeloid leukemia not having achieved remission ([place])   05/21/2020 -  Chemotherapy   The patient had azaCITIDine (VIDAZA) chemo  injection 150 mg, 78.5 mg/m2 = 142.5 mg, Subcutaneous,  Once, 1 of 4 cycles Administration: 150 mg (05/21/2020), 150 mg (05/22/2020), 150 mg (05/23/2020), 150 mg (05/24/2020)  for chemotherapy treatment.      ALLERGIES:  has No Known Allergies.  MEDICATIONS:  Current Outpatient Medications  Medication Sig Dispense Refill  . acetaminophen (TYLENOL) 500 MG tablet Take 500 mg by mouth daily as needed for moderate pain.    Marland Kitchen allopurinol  (ZYLOPRIM) 300 MG tablet TAKE 1/2 TABLET (150 MG TOTAL) BY MOUTH DAILY. 45 tablet 1  . Cyanocobalamin (RA VITAMIN B-12 TR) 1000 MCG TBCR Take 1,000 mcg by mouth daily at 12 noon.     . finasteride (PROSCAR) 5 MG tablet Take 1 tablet (5 mg total) by mouth every evening. 90 tablet 3  . folic acid (FOLVITE) 1 MG tablet Take 1 mg by mouth daily at 12 noon.     . latanoprost (XALATAN) 0.005 % ophthalmic solution Place 1 drop into both eyes at bedtime.     Marland Kitchen levofloxacin (LEVAQUIN) 250 MG tablet Take 250 mg by mouth daily.    . midodrine (PROAMATINE) 5 MG tablet Take 1 tablet (5 mg total) by mouth 3 (three) times daily with meals. 90 tablet 0  . POSACONAZOLE PO Take 300 mg by mouth daily.    . potassium chloride SA (KLOR-CON) 20 MEQ tablet Take 1 tablet (20 mEq total) by mouth daily. (Patient taking differently: Take 20 mEq by mouth daily. Patient taking every other day) 30 tablet 0  . sodium bicarbonate 650 MG tablet Take 650 mg by mouth 2 (two) times daily.    . timolol (TIMOPTIC) 0.5 % ophthalmic solution Place 1 drop into both eyes daily. In AM  5  . valACYclovir (VALTREX) 500 MG tablet Take 500 mg by mouth daily.    . cyanocobalamin 1000 MCG tablet Take by mouth. (Patient not taking: Reported on 05/02/2020)     No current facility-administered medications for this visit.    VITAL SIGNS: BP 134/74   Pulse 75   Temp 97.9 F (36.6 C) (Tympanic)   Wt 170 lb 8 oz (77.3 kg)   SpO2 100%   BMI 23.12 kg/m  Filed Weights   05/25/20 0854  Weight: 170 lb 8 oz (77.3 kg)    Estimated body mass index is 23.12 kg/m as calculated from the following:   Height as of 05/21/20: 6' (1.829 m).   Weight as of this encounter: 170 lb 8 oz (77.3 kg).  LABS: CBC:    Component Value Date/Time   WBC 3.6 (L) 05/24/2020 1415   HGB 7.9 (L) 05/24/2020 1415   HGB 19.3 (H) 08/24/2012 1632   HCT 23.7 (L) 05/24/2020 1415   HCT 59.8 (H) 08/24/2012 1632   PLT 172 05/24/2020 1415   PLT 551 (H) 08/24/2012 1632    MCV 87.5 05/24/2020 1415   MCV 84 08/24/2012 1632   NEUTROABS 2.2 05/24/2020 1415   LYMPHSABS 0.7 05/24/2020 1415   MONOABS 0.1 05/24/2020 1415   EOSABS 0.0 05/24/2020 1415   BASOSABS 0.0 05/24/2020 1415   Comprehensive Metabolic Panel:    Component Value Date/Time   NA 130 (L) 05/24/2020 1455   NA 137 08/24/2012 1632   K 4.2 05/24/2020 1455   K 4.2 08/24/2012 1632   CL 102 05/24/2020 1455   CL 108 (H) 08/24/2012 1632   CO2 23 05/24/2020 1455   CO2 21 08/24/2012 1632   BUN 33 (H) 05/24/2020 1455   BUN 18  08/24/2012 1632   CREATININE 1.44 (H) 05/24/2020 1455   CREATININE 0.97 08/24/2012 1632   GLUCOSE 108 (H) 05/24/2020 1455   GLUCOSE 113 (H) 08/24/2012 1632   CALCIUM 7.9 (L) 05/24/2020 1455   CALCIUM 9.6 08/24/2012 1632   AST 32 05/24/2020 1455   AST 42 (H) 08/24/2012 1632   ALT 7 05/24/2020 1455   ALT 41 08/24/2012 1632   ALKPHOS 63 05/24/2020 1455   ALKPHOS 155 (H) 08/24/2012 1632   BILITOT 1.4 (H) 05/24/2020 1455   BILITOT 0.9 08/24/2012 1632   PROT 8.4 (H) 05/24/2020 1455   PROT 8.6 (H) 08/24/2012 1632   ALBUMIN 2.3 (L) 05/24/2020 1455   ALBUMIN 4.1 08/24/2012 1632    RADIOGRAPHIC STUDIES: DG Chest 1 View  Result Date: 04/26/2020 CLINICAL DATA:  Shortness of breath EXAM: CHEST  1 VIEW COMPARISON:  04/25/2020, CT 03/19/2020 FINDINGS: Stable cardiomediastinal silhouette. Interim development of vascular congestion and moderate diffuse bilateral interstitial opacities suspicious for pulmonary edema. More focal areas of airspace disease within the peripheral right greater than left lung suspicious for pneumonia. Possible tiny pleural effusions. No pneumothorax. IMPRESSION: Interim development of vascular congestion and moderate diffuse bilateral interstitial opacities suspicious for pulmonary edema. More focal areas of airspace disease within the peripheral right greater than left lung suspicious for pneumonia. Electronically Signed   By: Donavan Foil M.D.   On: 04/26/2020  21:33   US RENAL  Result Date: 04/29/2020 CLINICAL DATA:  Acute on chronic kidney disease.  Inpatient. EXAM: RENAL / URINARY TRACT ULTRASOUND COMPLETE COMPARISON:  08/04/2016 renal sonogram. FINDINGS: Right Kidney: Renal measurements: 10.2 x 5.1 x 5.6 cm = volume: 152 mL. Echogenic right renal parenchyma, normal thickness. Mild right hydronephrosis. No renal mass. Left Kidney: Renal measurements: 12.6 x 5.3 x 5.4 cm = volume: 188 mL. Echogenic left renal parenchyma, normal thickness. No left hydronephrosis. Tiny simple parapelvic left renal cyst. Bladder: Appears normal for degree of bladder distention. Other: Diffuse bladder wall thickening and trabeculation. Bilateral ureteral jets demonstrated in the bladder. Splenomegaly. Bilateral pleural effusions. Trace ascites. IMPRESSION: 1. Echogenic kidneys, compatible with nonspecific renal parenchymal disease. 2. Mild right hydronephrosis. No left hydronephrosis. Bilateral ureteral jets visualized in the bladder. 3. Diffuse bladder wall thickening and trabeculation, nonspecific. Suggest correlation with urinalysis. 4. Incidental bilateral pleural effusions, trace ascites and splenomegaly. Electronically Signed   By: Ilona Sorrel M.D.   On: 04/29/2020 16:44   IR Fluoro Guide CV Line Left  Result Date: 05/18/2020 CLINICAL DATA:  AML, needs venous access for treatment regimen. Renal insufficiency. EXAM: TUNNELED CENTRAL VENOUS CATHETER PLACEMENT WITH ULTRASOUND AND FLUOROSCOPIC GUIDANCE TECHNIQUE: The procedure, risks, benefits, and alternatives were explained to the patient. Questions regarding the procedure were encouraged and answered. The patient understands and consents to the procedure. Patency of the right IJ vein was confirmed with ultrasound with image documentation. An appropriate skin site was determined. Region was prepped using maximum barrier technique including cap and mask, sterile gown, sterile gloves, large sterile sheet, and Chlorhexidine as  cutaneous antisepsis. The region was infiltrated locally with 1% lidocaine. Under real-time ultrasound guidance, the right IJ vein was accessed with a 21 gauge micropuncture needle; the needle tip within the vein was confirmed with ultrasound image documentation. 25F single-lumen cuffed PowerLine tunneled from a right anterior chest wall approach to the dermatotomy site. Needle exchanged over the 018 guidewire for transitional dilator, through which the catheter which had been cut to 22 cm was advanced under intermittent fluoroscopy, positioned with its tip at  the cavoatrial junction. Spot chest radiograph confirms good catheter position. No pneumothorax. Catheter was flushed per protocol. Catheter secured externally with O Prolene suture. The right IJ dermatotomy site was closed with Dermabond. COMPLICATIONS: COMPLICATIONS None immediate FLUOROSCOPY TIME:  Less than 0.1 minute; 0.25 mGy COMPARISON:  None IMPRESSION: 1. Technically successful placement of tunneled right IJ tunneled single-lumen power injectable catheter with ultrasound and fluoroscopic guidance. Ready for routine use. Electronically Signed   By: Lucrezia Europe M.D.   On: 05/18/2020 11:33   DG Chest Port 1 View  Result Date: 04/25/2020 CLINICAL DATA:  Fever short of breath EXAM: PORTABLE CHEST 1 VIEW COMPARISON:  02/29/2020 FINDINGS: Patchy bilateral somewhat peripheral pulmonary opacity right greater than left. No pleural effusion. Normal heart size. No pneumothorax. IMPRESSION: Patchy bilateral peripheral pulmonary infiltrates right greater than left concerning for bilateral pneumonia, possible atypical or viral pneumonia. Electronically Signed   By: Donavan Foil M.D.   On: 04/25/2020 19:26   ECHOCARDIOGRAM COMPLETE  Result Date: 04/26/2020    ECHOCARDIOGRAM REPORT   Patient Name:   JHONATHAN DESROCHES [facility] Date of Exam: 04/26/2020 Medical Rec #:  [id number]       Height:       72.0 in Accession #:    [id number]      Weight:       164.9 lb Date of Birth:   1946/07/01       BSA:          1.963 m Patient Age:    47 years        BP:           114/63 mmHg Patient Gender: M               HR:           91 bpm. Exam Location:  [facility] Procedure: 2D Echo, Color Doppler and Cardiac Doppler Indications:     R01.1 Murmur  History:         Patient has no prior history of Echocardiogram examinations.                  CKD; Arrythmias:RBBB.  Sonographer:     Charmayne Sheer RDCS (AE) Referring Phys:  [id number] Cleaster Corin PATEL Diagnosing Phys: Kathlyn Sacramento MD IMPRESSIONS  1. Left ventricular ejection fraction, by estimation, is 55 to 60%. The left ventricle has normal function. The left ventricle has no regional wall motion abnormalities. Left ventricular diastolic parameters were normal.  2. Right ventricular systolic function is normal. The right ventricular size is normal. There is mildly elevated pulmonary artery systolic pressure.  3. Left atrial size was mild to moderately dilated.  4. The mitral valve is normal in structure. Mild to moderate mitral valve regurgitation. No evidence of mitral stenosis.  5. The aortic valve is abnormal. Aortic valve regurgitation is mild. Mild aortic valve stenosis. Aortic valve area, by VTI measures 1.82 cm. Aortic valve mean gradient measures 10.0 mmHg.  6. The inferior vena cava is normal in size with <50% respiratory variability, suggesting right atrial pressure of 8 mmHg. FINDINGS  Left Ventricle: Left ventricular ejection fraction, by estimation, is 55 to 60%. The left ventricle has normal function. The left ventricle has no regional wall motion abnormalities. The left ventricular internal cavity size was normal in size. There is  no left ventricular hypertrophy. Left ventricular diastolic parameters were normal. Right Ventricle: The right ventricular size is normal. No increase in right ventricular wall thickness. Right ventricular systolic function is normal.  There is mildly elevated pulmonary artery systolic pressure. The tricuspid regurgitant  velocity is 2.97  m/s, and with an assumed right atrial pressure of 8 mmHg, the estimated right ventricular systolic pressure is 33.2 mmHg. Left Atrium: Left atrial size was mild to moderately dilated. Right Atrium: Right atrial size was normal in size. Pericardium: There is no evidence of pericardial effusion. Mitral Valve: The mitral valve is normal in structure. Normal mobility of the mitral valve leaflets. Mild to moderate mitral valve regurgitation. No evidence of mitral valve stenosis. MV peak gradient, 4.8 mmHg. The mean mitral valve gradient is 2.0 mmHg. Tricuspid Valve: The tricuspid valve is normal in structure. Tricuspid valve regurgitation is mild . No evidence of tricuspid stenosis. Aortic Valve: The aortic valve is abnormal. Aortic valve regurgitation is mild. Aortic regurgitation PHT measures 550 msec. Mild aortic stenosis is present. Aortic valve mean gradient measures 10.0 mmHg. Aortic valve peak gradient measures 17.5 mmHg. Aortic valve area, by VTI measures 1.82 cm. Pulmonic Valve: The pulmonic valve was normal in structure. Pulmonic valve regurgitation is trivial. No evidence of pulmonic stenosis. Aorta: The aortic root is normal in size and structure. Venous: The inferior vena cava is normal in size with less than 50% respiratory variability, suggesting right atrial pressure of 8 mmHg. IAS/Shunts: No atrial level shunt detected by color flow Doppler.  LEFT VENTRICLE PLAX 2D LVIDd:         4.70 cm  Diastology LVIDs:         3.13 cm  LV e' lateral:   8.16 cm/s LV PW:         0.94 cm  LV E/e' lateral: 12.0 LV IVS:        0.89 cm  LV e' medial:    6.85 cm/s LVOT diam:     2.00 cm  LV E/e' medial:  14.3 LV SV:         70 LV SV Index:   36 LVOT Area:     3.14 cm  RIGHT VENTRICLE RV Basal diam:  3.68 cm LEFT ATRIUM             Index       RIGHT ATRIUM           Index LA diam:        4.30 cm 2.19 cm/m  RA Area:     16.80 cm LA Vol (A2C):   66.6 ml 33.93 ml/m RA Volume:   41.10 ml  20.94 ml/m LA  Vol (A4C):   85.0 ml 43.31 ml/m LA Biplane Vol: 78.9 ml 40.20 ml/m  AORTIC VALVE                    PULMONIC VALVE AV Area (Vmax):    1.68 cm     PV Vmax:       1.56 m/s AV Area (Vmean):   1.71 cm     PV Vmean:      102.000 cm/s AV Area (VTI):     1.82 cm     PV VTI:        0.267 m AV Vmax:           209.33 cm/s  PV Peak grad:  9.7 mmHg AV Vmean:          148.000 cm/s PV Mean grad:  5.0 mmHg AV VTI:            0.386 m AV Peak Grad:      17.5 mmHg AV Mean  Grad:      10.0 mmHg LVOT Vmax:         112.00 cm/s LVOT Vmean:        80.700 cm/s LVOT VTI:          0.223 m LVOT/AV VTI ratio: 0.58 AI PHT:            550 msec  AORTA Ao Root diam: 3.60 cm MITRAL VALVE               TRICUSPID VALVE MV Area (PHT): 5.54 cm    TR Peak grad:   35.3 mmHg MV Peak grad:  4.8 mmHg    TR Vmax:        297.00 cm/s MV Mean grad:  2.0 mmHg MV Vmax:       1.10 m/s    SHUNTS MV Vmean:      70.4 cm/s   Systemic VTI:  0.22 m MV Decel Time: 137 msec    Systemic Diam: 2.00 cm MV E velocity: 97.70 cm/s MV A velocity: 59.10 cm/s MV E/A ratio:  1.65 Kathlyn Sacramento MD Electronically signed by Kathlyn Sacramento MD Signature Date/Time: 04/26/2020/12:15:51 PM    Final     PERFORMANCE STATUS (ECOG) : 2 - Symptomatic, <50% confined to bed  Review of Systems Unless otherwise noted, a complete review of systems is negative.  Physical Exam General: NAD, thin Cardiovascular: regular rate and rhythm Pulmonary: clear ant fields Abdomen: soft, nontender, + bowel sounds GU: no suprapubic tenderness Extremities: + edema, no joint deformities Skin: no rashes Neurological: Weakness but otherwise nonfocal  IMPRESSION: I met with patient and wife in the clinic today prior to his blood transfusion.  Patient endorses progressive fatigue and poor oral intake over the past couple of months.  He says that he sleeps most of the day and endorses early satiety with meals.  I do note that his weight has appeared relatively stable.  Patient is transfusion  dependent at this point and feels mildly improved after receiving PRBCs.  He has been receiving treatments at [facility].  Goals seem aligned with continued treatments for now.  I met privately with patient's wife.  Emotional support was provided in what is clearly a difficult and emotional situation given patient's overall decline.  We discussed ways to maximize his quality of life.  Patient has required more care and support in the home over the past couple of months.  She is interested in exploring resources to help with care at home.  Patient was recently referred for home health.  I will also send a referral to [facility] for NP/SW.   Wife is interested in speaking with patient to clarify overall goals.  I reviewed with her a MOST form, which she took home to discuss with patient.  PLAN: -Continue current scope of treatment -Referral to nutrition -Referral to home-based palliative care -MOST form reviewed -RTC next week  Case and plan discussed with Dr. Mike Gip  Patient expressed understanding and was in agreement with this plan. He also understands that He can call the clinic at any time with any questions, concerns, or complaints.     Time Total: 60 minutes  Visit consisted of counseling and education dealing with the complex and emotionally intense issues of symptom management and palliative care in the setting of serious and potentially life-threatening illness.Greater than 50%  of this time was spent counseling and coordinating care related to the above assessment and plan.  Signed by: Altha Harm, PhD, NP-C

## 2020-05-25 NOTE — Progress Notes (Signed)
Patient here today for follow up regarding AML. Patient reports pain in his legs and feet 2/10. Patient takes tylenol with relief. Patients wife also reports cough.

## 2020-05-25 NOTE — Addendum Note (Signed)
Addended by: Livia Snellen on: 05/25/2020 04:11 PM   Modules accepted: Orders

## 2020-05-27 LAB — BPAM RBC
Blood Product Expiration Date: 202109072359
Blood Product Expiration Date: 202109142359
Blood Product Expiration Date: 202109162359
ISSUE DATE / TIME: 202109031013
ISSUE DATE / TIME: 202109031158
ISSUE DATE / TIME: 202109031456
Unit Type and Rh: 9500
Unit Type and Rh: 9500
Unit Type and Rh: 9500

## 2020-05-27 LAB — TYPE AND SCREEN
ABO/RH(D): O POS
Antibody Screen: NEGATIVE
Unit division: 0
Unit division: 0
Unit division: 0

## 2020-05-28 ENCOUNTER — Other Ambulatory Visit
Admission: RE | Admit: 2020-05-28 | Discharge: 2020-05-28 | Disposition: A | Discharge status: Home / Self Care | Payer: Medicare Other | Source: Ambulatory Visit | Attending: Hematology and Oncology | Admitting: Hematology and Oncology

## 2020-05-28 ENCOUNTER — Other Ambulatory Visit: Payer: Self-pay | Admitting: Hematology and Oncology

## 2020-05-28 ENCOUNTER — Other Ambulatory Visit: Payer: Self-pay

## 2020-05-28 DIAGNOSIS — C9200 Acute myeloblastic leukemia, not having achieved remission: Secondary | ICD-10-CM

## 2020-05-28 DIAGNOSIS — D649 Anemia, unspecified: Secondary | ICD-10-CM | POA: Diagnosis not present

## 2020-05-28 DIAGNOSIS — R17 Unspecified jaundice: Secondary | ICD-10-CM | POA: Insufficient documentation

## 2020-05-28 LAB — CBC WITH DIFFERENTIAL/PLATELET
Abs Immature Granulocytes: 0.4 10*3/uL — ABNORMAL HIGH (ref 0.00–0.07)
Basophils Absolute: 0 10*3/uL (ref 0.0–0.1)
Basophils Relative: 1 %
Eosinophils Absolute: 0 10*3/uL (ref 0.0–0.5)
Eosinophils Relative: 0 %
HCT: 26.2 % — ABNORMAL LOW (ref 39.0–52.0)
Hemoglobin: 8.5 g/dL — ABNORMAL LOW (ref 13.0–17.0)
Immature Granulocytes: 12 %
Lymphocytes Relative: 21 %
Lymphs Abs: 0.7 10*3/uL (ref 0.7–4.0)
MCH: 28.2 pg (ref 26.0–34.0)
MCHC: 32.4 g/dL (ref 30.0–36.0)
MCV: 87 fL (ref 80.0–100.0)
Monocytes Absolute: 0.5 10*3/uL (ref 0.1–1.0)
Monocytes Relative: 15 %
Neutro Abs: 1.7 10*3/uL (ref 1.7–7.7)
Neutrophils Relative %: 51 %
Platelets: 127 10*3/uL — ABNORMAL LOW (ref 150–400)
RBC: 3.01 MIL/uL — ABNORMAL LOW (ref 4.22–5.81)
RDW: 18.7 % — ABNORMAL HIGH (ref 11.5–15.5)
Smear Review: NORMAL
WBC: 3.2 10*3/uL — ABNORMAL LOW (ref 4.0–10.5)
nRBC: 5 % — ABNORMAL HIGH (ref 0.0–0.2)

## 2020-05-28 LAB — PREPARE RBC (CROSSMATCH)

## 2020-05-28 MED FILL — VENCLEXTA 100 MG TABLET: 30 days supply | Qty: 30 | Fill #0 | Status: AC

## 2020-05-28 MED FILL — VALACYCLOVIR 500 MG TABLET: ORAL | 30 days supply | Qty: 30 | Fill #1

## 2020-05-28 MED FILL — VALACYCLOVIR 500 MG TABLET: 30 days supply | Qty: 30 | Fill #1 | Status: AC

## 2020-05-28 NOTE — Progress Notes (Signed)
[facility]  [address], [address] [place], [place] [address] Phone: [phone]  Fax: [phone]   Clinic Day:  05/29/2020  Referring physician: [name], MD  Chief Complaint: Ruben [name] is a 74 y.o. 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  HPI: The patient was last seen in the was last seen in the hematology clinic on 05/24/2020.  At that time, he felt fatigued.  We discussed support support services.  He was referred to Altha Harm, NP from paliative care.  . Hematocrit was 23.7, hemoglobin 7.9, MCV 87.5, platelets 172,000, WBC 3,600 (ANC 2,200). Sodium was 130. Creatinine was 1.44 (CrCl 48 ml/min).Albumin was 2.3. Total bilirubin was 1.4 (direct 0.2). Calcium was 7.9. He received day 4 of cycle #2 azacytidine SQ + venetoclax.  He received 2 units of PRBCs in [name] on 05/25/2020.  CBC on 05/25/2020 revealed a hematocrit of 22.6, hemoglobin 7.7, MCV 85.3, platelets 184,000, WBC 3,600 (ANC 1,900).  The patient saw Billey Chang, NP on 05/25/2020 to discuss goals of care. He endorsed progressive fatigue and poor oral intake over the past couple of months. He was provided a MOST form and was referred to nutrition and home-based palliative care.   CBC on 05/28/2020 revealed a hematocrit of 26.2, hemoglobin 8.5, MCV 87.0, platelets 127,000, WBC 3200 (ANC 1700).  During the interim, he states "I'm here." He is doing fine with treatment. He notes that his stomach is a little bit sore because of the shots. The patient's wife states that she has noticed an increase in his cognitive ability. He has diarrhea that comes and goes. He denies nausea and vomiting. He feels that his appetite has been okay.  The patient and his wife have been looking over the MOST form. He states that he does not want a feeding tube or to be intubated. He is "ok" with chest  compressions and antibiotics. He is unsure about having his heart shocked.  The patient's granddaughter visited this weekend.   Past Medical History:  Diagnosis Date  . Blood dyscrasia   . BPH (benign prostatic hyperplasia)   . Cancer ([place])    SKIN/ POLYCYTHEMIA VERA  . Chronic kidney disease    RENAL INSUFF (40%)  . Crohn disease ([place])   . Crohn's disease ([place])   . Glaucoma   . Glaucoma   . Gout   . History of chicken pox   . Myelofibrosis ([place])   . Nosebleed   . RBBB   . Right bundle branch block     Past Surgical History:  Procedure Laterality Date  . CATARACT EXTRACTION W/PHACO Left 07/13/2018   Procedure: CATARACT EXTRACTION PHACO AND INTRAOCULAR LENS PLACEMENT (IOC);  Surgeon: [name], MD;  Location: [facility] ORS;  Service: Ophthalmology;  Laterality: Left;  [place] 00:39  CDE 5.14 Fluid pack lot # [id number]  . CATARACT EXTRACTION W/PHACO Right 08/03/2018   Procedure: CATARACT EXTRACTION PHACO AND INTRAOCULAR LENS PLACEMENT (IOC);  Surgeon: [name], MD;  Location: [facility] ORS;  Service: Ophthalmology;  Laterality: Right;  [place] 00:43.4 CDE 5.23 Fluid pack Lot # [id number]  . COLONOSCOPY WITH PROPOFOL N/A 03/27/2017   Procedure: COLONOSCOPY WITH PROPOFOL;  Surgeon: [name], MD;  Location: [facility] ENDOSCOPY;  Service: Endoscopy;  Laterality: N/A;  . IR FLUORO GUIDE CV LINE LEFT  05/18/2020  . MOHS SURGERY     EAR  . PROSTATE  SURGERY  2008   Prostate Biopsy in [place] to elevated PSA.  per patient normal  . Skin Lesion Basal cell removed    . wart removal     from eyelid    Family History  Problem Relation Age of Onset  . Cancer Mother        Liver  . Cancer Father        Prostate  . Cancer Sister        Hodgkins lymphoma  . Cancer Paternal Aunt        Breast    Social History:  reports that he quit smoking about 16 years ago. His smoking use included cigarettes. He has a 5.00 pack-year smoking history. He has never used smokeless tobacco. He  reports previous alcohol use of about 4.0 - 5.0 standard drinks of alcohol per week. He reports that he does not use drugs. The patientpreviously worked for [employer]. He no longer volunteers for [employer]. The patient's wife [phone]).He lives in [place]. The patient is accompanied by [name] today.  Allergies: No Known Allergies  Current Medications: Current Outpatient Medications  Medication Sig Dispense Refill  . acetaminophen (TYLENOL) 500 MG tablet Take 500 mg by mouth daily as needed for moderate pain.    Marland Kitchen allopurinol (ZYLOPRIM) 300 MG tablet TAKE 1/2 TABLET (150 MG TOTAL) BY MOUTH DAILY. 45 tablet 1  . Cyanocobalamin (RA VITAMIN B-12 TR) 1000 MCG TBCR Take 1,000 mcg by mouth daily at 12 noon.     . cyanocobalamin 1000 MCG tablet Take by mouth.     . finasteride (PROSCAR) 5 MG tablet Take 1 tablet (5 mg total) by mouth every evening. 90 tablet 3  . folic acid (FOLVITE) 1 MG tablet Take 1 mg by mouth daily at 12 noon.     . latanoprost (XALATAN) 0.005 % ophthalmic solution Place 1 drop into both eyes at bedtime.     . midodrine (PROAMATINE) 5 MG tablet Take 1 tablet (5 mg total) by mouth 3 (three) times daily with meals. 90 tablet 0  . POSACONAZOLE PO Take 300 mg by mouth daily.    . potassium chloride SA (KLOR-CON) 20 MEQ tablet Take 1 tablet (20 mEq total) by mouth daily. (Patient taking differently: Take 20 mEq by mouth every other day. Patient taking every other day) 30 tablet 0  . sodium bicarbonate 650 MG tablet Take 650 mg by mouth 2 (two) times daily.    . timolol (TIMOPTIC) 0.5 % ophthalmic solution Place 1 drop into both eyes daily. In AM  5  . valACYclovir (VALTREX) 500 MG tablet Take 500 mg by mouth daily.    Marland Kitchen levofloxacin (LEVAQUIN) 250 MG tablet Take 250 mg by mouth daily. (Patient not taking: Reported on 05/29/2020)     No current facility-administered medications for this visit.    Review of Systems  Constitutional: Positive for  malaise/fatigue (low energy) and weight loss (8 lbs). Negative for chills, diaphoresis and fever.       Feels "fair".  HENT: Negative for congestion, ear discharge, ear pain, hearing loss, nosebleeds, sinus pain, sore throat and tinnitus.   Eyes: Negative for blurred vision.  Respiratory: Positive for cough and shortness of breath (on walking). Negative for hemoptysis and sputum production.   Cardiovascular: Negative for chest pain, palpitations and leg swelling.  Gastrointestinal: Positive for diarrhea (on and off). Negative for abdominal pain, blood in stool, constipation, heartburn, melena, nausea and vomiting.       Early satiety.  Decreased appetite. Drinking Boost.  Genitourinary: Negative for dysuria, frequency, hematuria and urgency.  Musculoskeletal: Negative for back pain, joint pain, myalgias and neck pain.  Skin: Negative for itching and rash.  Neurological: Negative for dizziness, tingling, sensory change, weakness and headaches.  Endo/Heme/Allergies: Does not bruise/bleed easily.  Psychiatric/Behavioral: Negative for depression and memory loss. The patient is not nervous/anxious and does not have insomnia.   All other systems reviewed and are negative.  Performance status (ECOG): 2  Vitals Blood pressure (!) 144/75, pulse 79, temperature (!) 97.1 F (36.2 C), temperature source Tympanic, resp. rate 18, height 6' (1.829 m), weight 162 lb 5.9 oz (73.6 kg), SpO2 100 %.   Physical Exam Vitals and nursing note reviewed.  Constitutional:      General: He is not in acute distress.    Appearance: He is not diaphoretic.     Comments: Chronically fatigued appearing gentleman in no acute distress.   HENT:     Head: Normocephalic and atraumatic.     Comments: Short thin gray hair with temporal wasting.    Mouth/Throat:     Mouth: Mucous membranes are moist.     Pharynx: Oropharynx is clear.  Eyes:     General: No scleral icterus.    Extraocular Movements: Extraocular movements  intact.     Conjunctiva/sclera: Conjunctivae normal.     Pupils: Pupils are equal, round, and reactive to light.  Cardiovascular:     Rate and Rhythm: Normal rate and regular rhythm.     Heart sounds: Normal heart sounds. No murmur heard.   Pulmonary:     Effort: Pulmonary effort is normal. No respiratory distress.     Breath sounds: Normal breath sounds. No wheezing or rales.  Chest:     Chest wall: No tenderness.  Abdominal:     General: Bowel sounds are normal. There is no distension.     Palpations: There is splenomegaly (palpable 4 fingerbreadths below left costal margin). There is no hepatomegaly or mass.     Tenderness: There is no abdominal tenderness. There is no guarding or rebound.  Musculoskeletal:        General: No swelling or tenderness (ankles). Normal range of motion.     Cervical back: Normal range of motion and neck supple.     Right lower leg: Edema (trace, chronic) present.     Left lower leg: Edema (trace, chronic) present.  Lymphadenopathy:     Head:     Right side of head: No preauricular, posterior auricular or occipital adenopathy.     Left side of head: No preauricular, posterior auricular or occipital adenopathy.     Cervical: No cervical adenopathy.     Upper Body:     Right upper body: No supraclavicular or axillary adenopathy.     Left upper body: No supraclavicular or axillary adenopathy.     Lower Body: No right inguinal adenopathy. No left inguinal adenopathy.  Skin:    General: Skin is warm and dry.  Neurological:     Mental Status: He is alert and oriented to person, place, and time.  Psychiatric:        Behavior: Behavior normal.        Thought Content: Thought content normal.        Judgment: Judgment normal.    Infusion on 05/29/2020  Component Date Value Ref Range Status  . Magnesium 05/29/2020 1.7  1.7 - 2.4 mg/dL Final   Performed at [facility], [address],  [place], [place] [address]  . WBC 05/29/2020 2.9* 4.0 -  10.5 K/uL Final  . RBC 05/29/2020 2.78* 4.22 - 5.81 MIL/uL Final  . Hemoglobin 05/29/2020 7.8* 13.0 - 17.0 g/dL Final  . HCT 05/29/2020 24.0* 39 - 52 % Final  . MCV 05/29/2020 86.3  80.0 - 100.0 fL Final  . MCH 05/29/2020 28.1  26.0 - 34.0 pg Final  . MCHC 05/29/2020 32.5  30.0 - 36.0 g/dL Final  . RDW 05/29/2020 18.4* 11.5 - 15.5 % Final  . Platelets 05/29/2020 106* 150 - 400 K/uL Final   Performed at [facility], [address] [place], [place] [address]  . Sodium 05/29/2020 133* 135 - 145 mmol/L Final  . Potassium 05/29/2020 3.6  3.5 - 5.1 mmol/L Final  . Chloride 05/29/2020 100  98 - 111 mmol/L Final  . CO2 05/29/2020 25  22 - 32 mmol/L Final  . Glucose, Bld 05/29/2020 100* 70 - 99 mg/dL Final   Glucose reference range applies only to samples taken after fasting for at least 8 hours.  . BUN 05/29/2020 24* 8 - 23 mg/dL Final  . Creatinine, Ser 05/29/2020 1.28* 0.61 - 1.24 mg/dL Final  . Calcium 05/29/2020 7.9* 8.9 - 10.3 mg/dL Final  . Total Protein 05/29/2020 8.7* 6.5 - 8.1 g/dL Final  . Albumin 05/29/2020 2.5* 3.5 - 5.0 g/dL Final  . AST 05/29/2020 27  15 - 41 U/L Final  . ALT 05/29/2020 10  0 - 44 U/L Final  . Alkaline Phosphatase 05/29/2020 69  38 - 126 U/L Final  . Total Bilirubin 05/29/2020 1.1  0.3 - 1.2 mg/dL Final  . GFR calc non Af Amer 05/29/2020 55* >60 mL/min Final  . GFR calc Af Amer 05/29/2020 >60  >60 mL/min Final  . Anion gap 05/29/2020 8  5 - 15 Final   Performed at [facility], [address], [place], [place] [address]  Hospital Outpatient Visit on 05/28/2020  Component Date Value Ref Range Status  . ABO/RH(D) 05/28/2020 O POS   Final  . Antibody Screen 05/28/2020 NEG   Final  . Sample Expiration 05/28/2020 05/31/2020,2359   Final  . Unit Number 05/28/2020    Final                   AYTKZ:[id number] Performed at [facility], [address] [place], [place] [address]   . Blood Component Type 05/28/2020    Final                    Value:RCLI PHER 1 Performed at [facility], [address] [place], [place] [address]   . Unit division 05/28/2020    Final                   Value:00 Performed at [facility], [address] [place], [place] [address]   . Status of Unit 05/28/2020    Final                   Value:ALLOCATED Performed at [facility], [address], [place], [place] [address]   . Transfusion Status 05/28/2020 OK TO TRANSFUSE   Final  . Crossmatch Result 05/28/2020    Final                   Value:Compatible Performed at [facility], [address], [place], [place] [address]   . WBC 05/28/2020 3.2* 4.0 -  10.5 K/uL Final  . RBC 05/28/2020 3.01* 4.22 - 5.81 MIL/uL Final  . Hemoglobin 05/28/2020 8.5* 13.0 - 17.0 g/dL Final  . HCT 05/28/2020 26.2* 39 - 52 % Final  . MCV 05/28/2020 87.0  80.0 - 100.0 fL Final  . MCH 05/28/2020 28.2  26.0 - 34.0 pg Final  . MCHC 05/28/2020 32.4  30.0 - 36.0 g/dL Final  . RDW 05/28/2020 18.7* 11.5 - 15.5 % Final  . Platelets 05/28/2020 127* 150 - 400 K/uL Final   Comment: Immature Platelet Fraction may be clinically indicated, consider ordering this additional test [id number]   . nRBC 05/28/2020 5.0* 0.0 - 0.2 % Final  . Neutrophils Relative % 05/28/2020 51  % Final  . Neutro Abs 05/28/2020 1.7  1.7 - 7.7 K/uL Final  . Lymphocytes Relative 05/28/2020 21  % Final  . Lymphs Abs 05/28/2020 0.7  0.7 - 4.0 K/uL Final  . Monocytes Relative 05/28/2020 15  % Final  . Monocytes Absolute 05/28/2020 0.5  0 - 1 K/uL Final  . Eosinophils Relative 05/28/2020 0  % Final  . Eosinophils Absolute 05/28/2020 0.0  0 - 0 K/uL Final  . Basophils Relative 05/28/2020 1  % Final  . Basophils Absolute 05/28/2020 0.0  0 - 0 K/uL Final  . WBC Morphology 05/28/2020 MARKED LEFT SHIFT (>5% METAS,MYELOS AND PROS, OCC BLAST NOTED)   Final  . RBC Morphology 05/28/2020 MORPHOLOGY UNREMARKABLE   Final  . Smear Review 05/28/2020 Normal  platelet morphology   Final  . Immature Granulocytes 05/28/2020 12  % Final  . Abs Immature Granulocytes 05/28/2020 0.40* 0.00 - 0.07 K/uL Final   Performed at [facility], [address] [place], [place] [address]  . Blood Product Unit Number 05/28/2020 [id number]   Final  . PRODUCT CODE 05/28/2020 [id number]   Final  . Unit Type and Rh 05/28/2020 5100   Final  . Blood Product Expiration Date 05/28/2020 [id number]   Final  Orders Only on 05/28/2020  Component Date Value Ref Range Status  . Order Confirmation 05/28/2020    Final                   Value:ORDER PROCESSED BY BLOOD BANK Performed at [facility], [address] [place], [place] [address]     Assessment:  Ruben [name] is a 74 y.o. 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 He has had polycythemia dating back to 2013. Hematocrit was 62.1 with a hemoglobin of 19.8 on 03/28/2015. JAK 2 testing on 03/28/2015 revealed [id number] mutation. Erythropoietin level was 1.1 (low). 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  He began a phlebotomyprogram on 03/28/2015 to maintain a hematocrit goal of <45.Last phlebotomywason 05/02/2015. He is on a baby aspirin.  He was initially on hydroxyurea beginning in 11/2015 then [facility].  Bone marrowon 12/14/2015 revealed a persistent myeloproliferative neoplasm with myelofibrosis and alterations compatible with myelodysplatic progression. Marrow was packed (95-100% cellularity) with pan myelosis, multi-lineage dyspoiesis, and no significant increase in blasts. There was moderate to focally marked reticulin fibrosis (grade 2-3/3). Storage iron was not identified. Flow cytometry revealed non-specific atypical myeloid findings with no increase in blasts. 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 Cytogenetics were normal (46, XY).  Bone marrowon 04/07/2016 at [facility]  revealed a hypercellular marrow (>95%) with persistent involvement by myeloid neoplasm with 5% blasts. There was mild reticulin fibrosis. FISH t(9;22) results were normal. Myeloid mutation panel revealed JAK2  V617F, IDH2, RUNX1, and SRSF2 consistent with clonal evolution. Cytogenetics are pending.  Bone marrowon 08/09/2018 revealed a hypercellular bone marrow (>95%) with persistent involvement by myeloid neoplasm with <1% blasts by manual touch preparation differential. There was marked reticulin fibrosis with focal collagen deposition. Cytogenetics were normal (46, XY). - Myeloid mutation panel study is pending.  Bone marrowon 07/14/2019 revealed a hypercellular marrow (>95%) with persistent involvement by a myeloid neoplasm with extensive fibrosis and rare (< 5%) CD34 +blast byIHC. Myeloid mutation panel revealed the patient's previously reported variants inIDH2, JAK2, KRAS, NRAS and SRSF2 were again identified. There are no new variants. Cytogenetics are pending.  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  Bone marrow on 05/08/2020 revealed persistent disease, but with 50% reduction in blasts (20% to 5-10%).   He was [facility] to [facility] from 03/28/2020 - 04/04/2020 forcycle #1azacitidine and venetoclax.  Day #1 was 04/01/2020. He tolerated treatment well, with the exception of mild tumor lysis syndrome, diarrhea, epistaxis x 1, and mild hyponatremia. He completed a 5 day course ofceftriaxone andazithromycin forpneumonia.  He is currently day 40 s/p cycle #1 azacytidine and venetoclax.  Peripheral smear on 05/02/2020 showed absolute leukopenia, with circulating blasts, 6% by manual differential count.  He had a normocytic anemia, with few nucleated RBCs.  There was thrombocytopenia, with unremarkable morphology.  Findings were compatible with treatment for acute leukemia.   He is currently day 6 of cycle #2 azacitidine and venetoclax (began 05/21/2020).  He is RBC transfusion  dependent.  He receives PRBCs twice weekly.   He has urinary retention. Renal ultrasoundon 07/28/2016 revealed bilateral hydronephrosis (right >left) and a large postvoid residual (2 liters) suggesting bladder dysfunction or outlet obstruction. He underwent temporary Foley catheter placement. He performs I/O self catheterizations. He is on Flomax and finasteride. PSAwas 2.02 on 07/09/2016.  He is followed by GI (Dr. Vira Agar) for a history of polypsand Crohn's disease.He had an unremarkable colonoscopy in 03/2017.AbdomenandpelvisCTon 04/03/2019 revealed mild wall thickening of portions of a loop of small bowel within the pelvis with intervening areas of mildly dilated small bowel,consistent with active Crohn's disease. No evidence of an abscess or fistula. No other evidence of bowel inflammation.There was splenomegaly(16.7 x 7.7 x 15.6 cm), developed since12/11/2011.There was mild bladder wall thickeningandenlargement of the prostate. Chronic bladder outlet obstructionwas felt to bethe etiology of bladder wall thickening.  He has chronicrenal insufficiency(Cr 1.85; CrCl 36 ml/min). SPEP on 09/23/2016 was negative. Spot urine revealed 17.4% of 27.3 mg/dL of a monoclonal protein. 24 hour urine on 10/16/2016 revealed no monoclonal protein. Renal function transiently decreased in 06/2017 after Bactrim. 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  He has a history of epistaxis. Normal studies included: PT, PTT, von Willebrand panel, and platelet function assay (PFA).  He has right foot drop. 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 There was no non-contrast MRI evidence of brain metastasis. There was only mild for age nonspecific cerebral white signal changes. Lumbar spine MRIon 06/09/2019 showed diffusely abnormal marrow signal in the visible skeleton, as seen on the brain  MRI. There was no pathologic fracture and no extraosseous extension of tumor on the lumbar spine or visible sacrum. In conjunction with the splenomegaly by CT in [place] infiltration byleukemia/lymphoma may be the most likely etiology. Differential considerations included multiple myeloma and metastatic disease unknown primary. The involvement of the pelvis should be amenable to bone marrow biopsy.There was superimposed mild for age lumbar spine degeneration with no  spinal stenosis.There was mild degenerative L4 and L5 neural foraminal stenosis.  He has had a history of pneumoniasince 02/2020.  He was admitted to [facility] from 04/25/2020 - 04/30/2020 for sepsis due to pneumonia. He was treated with IV fluids and antibiotics. He received a total of 2 units of PRBCs (last 04/30/2020) for severe anemia. Hospital course was complicated by acute hypoxemic respiratory failure and pulmonary edema requiring BiPAP therapy and ICU transfer. He was treated for hypokalemia, hypomagnesemia, and hyponatremia.   Patient has a normocytic anemia.  He received 2 units of PRBCs at [facility] on 05/08/2020.   He received the Moderna COVID-19vaccineon 10/28/2019 and 11/29/2019. COVID-19 testing was negative on 02/29/2020.  Symptomatically, he feels "fair".  He remains fatigued.  Exam reveals splenomegaly.  Plan: 1.   Labs today: CBC with diff, CMP, Mg, type and cross 2.   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 He is day6 of cycle #2 azacitidine + Venetoclax (began 05/21/2020). Patient taking venetoclax 100 mg a day.             Continue prophylactic antibiotics:                        Posoconazole 300 mg po q day. Levaquin 250 mg po q day. Valacyclovir 500 mg po q day.             Labs reviewed.  Day 6 azacitidine today.  RTC tomorrow for day 7 azacitidine.             Review plan for  bone marrow [facility] on 06/13/2020 and then decision regarding direction of therapy. 3. Transfusion dependent anemia Hematocrit 24.0.  Hemoglobin 7.8.  MCV 86.3.  Platelets 106,000.  WBC 2900.             He has symptomatic anemia secondary to chemotherapy and AML.               Continue labs every Monday and Thursday with transfusion on Tuesdays and Fridays. Maintain Hgb >8.0.             Transfuse 2 units of irradiated leukopoor PRBCs today.  Continue to monitor platelet count.  Maintain platelets > 20,000.  No intervention needed. 4. Renal insufficiency and hypokalemia Creatinine 1.28.  Potassium 3.6.             Patient continues I/O catheterization.              Continue potassium 20 mEq p.o. every other day. 5.   Code status             Discuss with patient today.  He has a copy of the MOST form.  He notes DNI.  He feels "ok" about chest compressions but is unsure about cardioversion.  He does not want a feeding tube. 6.   Palliative care  He will follow-up with Billey Chang, NP on Fridays in [place]. 7.   Day 6 azacitidine today. 8.   Transfuse 2 units PRBCs today. 9.   RTC tomorrow for azacitidine. 10.   RTC on 05/31/2020 for labs (CBC with diff, type and screen). 11.   RTC in [place] on 06/01/2020 for 2 units of PRBCs. 12.   RTC on 06/04/2020 for MD assessment, labs (CBC with diff, CMP, type and screen).  I discussed the assessment and treatment plan with the patient.  The patient was provided an opportunity to ask questions and all were answered.  The  patient agreed with the plan and demonstrated an understanding of the instructions.  The patient was advised to call back if the symptoms worsen or if the condition fails to improve as anticipated.   Lequita Asal, MD, PhD    05/29/2020, 9:20 AM  I, Mirian Mo Tufford, am acting as Education administrator for [employer]. Mike Gip, MD, PhD.  I, Romesha Scherer C. Mike Gip, MD, have reviewed the above  documentation for accuracy and completeness, and I agree with the above.

## 2020-05-29 ENCOUNTER — Encounter: Payer: Medicare Other | Admitting: Family Medicine

## 2020-05-29 ENCOUNTER — Other Ambulatory Visit: Payer: Self-pay | Admitting: Hematology and Oncology

## 2020-05-29 ENCOUNTER — Other Ambulatory Visit: Payer: Self-pay

## 2020-05-29 ENCOUNTER — Inpatient Hospital Stay (HOSPITAL_BASED_OUTPATIENT_CLINIC_OR_DEPARTMENT_OTHER): Payer: Medicare Other | Admitting: Hematology and Oncology

## 2020-05-29 ENCOUNTER — Encounter: Payer: Self-pay | Admitting: Hematology and Oncology

## 2020-05-29 ENCOUNTER — Inpatient Hospital Stay: Payer: Medicare Other

## 2020-05-29 ENCOUNTER — Telehealth: Payer: Self-pay | Admitting: *Deleted

## 2020-05-29 VITALS — BP 151/75 | HR 88 | Temp 96.8°F | Resp 18

## 2020-05-29 VITALS — BP 144/75 | HR 79 | Temp 97.1°F | Resp 18 | Ht 72.0 in | Wt 162.4 lb

## 2020-05-29 DIAGNOSIS — Z7189 Other specified counseling: Secondary | ICD-10-CM | POA: Diagnosis not present

## 2020-05-29 DIAGNOSIS — D45 Polycythemia vera: Secondary | ICD-10-CM | POA: Diagnosis not present

## 2020-05-29 DIAGNOSIS — C9200 Acute myeloblastic leukemia, not having achieved remission: Secondary | ICD-10-CM

## 2020-05-29 DIAGNOSIS — D649 Anemia, unspecified: Secondary | ICD-10-CM

## 2020-05-29 DIAGNOSIS — D696 Thrombocytopenia, unspecified: Secondary | ICD-10-CM

## 2020-05-29 DIAGNOSIS — Z5111 Encounter for antineoplastic chemotherapy: Secondary | ICD-10-CM | POA: Diagnosis not present

## 2020-05-29 DIAGNOSIS — R6881 Early satiety: Secondary | ICD-10-CM | POA: Diagnosis not present

## 2020-05-29 DIAGNOSIS — N2889 Other specified disorders of kidney and ureter: Secondary | ICD-10-CM | POA: Diagnosis not present

## 2020-05-29 DIAGNOSIS — D6481 Anemia due to antineoplastic chemotherapy: Secondary | ICD-10-CM

## 2020-05-29 DIAGNOSIS — E876 Hypokalemia: Secondary | ICD-10-CM | POA: Diagnosis not present

## 2020-05-29 LAB — CBC
HCT: 24 % — ABNORMAL LOW (ref 39.0–52.0)
Hemoglobin: 7.8 g/dL — ABNORMAL LOW (ref 13.0–17.0)
MCH: 28.1 pg (ref 26.0–34.0)
MCHC: 32.5 g/dL (ref 30.0–36.0)
MCV: 86.3 fL (ref 80.0–100.0)
Platelets: 106 10*3/uL — ABNORMAL LOW (ref 150–400)
RBC: 2.78 MIL/uL — ABNORMAL LOW (ref 4.22–5.81)
RDW: 18.4 % — ABNORMAL HIGH (ref 11.5–15.5)
WBC: 2.9 10*3/uL — ABNORMAL LOW (ref 4.0–10.5)

## 2020-05-29 LAB — MAGNESIUM: Magnesium: 1.7 mg/dL (ref 1.7–2.4)

## 2020-05-29 MED ORDER — DIPHENHYDRAMINE HCL 25 MG PO CAPS
25.0000 mg | ORAL_CAPSULE | Freq: Once | ORAL | Status: AC
  Administered 2020-05-29: 25 mg via ORAL
  Filled 2020-05-29: qty 1

## 2020-05-29 MED ORDER — AZACITIDINE CHEMO SQ INJECTION
150.0000 mg | Freq: Once | INTRAMUSCULAR | Status: AC
  Administered 2020-05-29: 150 mg via SUBCUTANEOUS
  Filled 2020-05-29: qty 6

## 2020-05-29 MED ORDER — SODIUM CHLORIDE 0.9% IV SOLUTION
250.0000 mL | Freq: Once | INTRAVENOUS | Status: AC
  Administered 2020-05-29: 250 mL via INTRAVENOUS
  Filled 2020-05-29: qty 250

## 2020-05-29 MED ORDER — ACETAMINOPHEN 325 MG PO TABS
650.0000 mg | ORAL_TABLET | Freq: Once | ORAL | Status: AC
  Administered 2020-05-29: 650 mg via ORAL
  Filled 2020-05-29: qty 2

## 2020-05-29 MED ORDER — HEPARIN SOD (PORK) LOCK FLUSH 100 UNIT/ML IV SOLN
250.0000 [IU] | INTRAVENOUS | Status: AC | PRN
  Administered 2020-05-29: 250 [IU]
  Filled 2020-05-29: qty 5

## 2020-05-29 NOTE — Progress Notes (Signed)
No new changes noted today 

## 2020-05-29 NOTE — Telephone Encounter (Signed)
Patients wife called to verify lab appointment. Patient has had labs and is seeing the doctor no further action required.

## 2020-05-30 ENCOUNTER — Telehealth: Payer: Self-pay

## 2020-05-30 ENCOUNTER — Inpatient Hospital Stay: Payer: Medicare Other

## 2020-05-30 VITALS — BP 146/74 | HR 78 | Resp 18

## 2020-05-30 DIAGNOSIS — R6881 Early satiety: Secondary | ICD-10-CM | POA: Diagnosis not present

## 2020-05-30 DIAGNOSIS — Z5111 Encounter for antineoplastic chemotherapy: Secondary | ICD-10-CM | POA: Diagnosis not present

## 2020-05-30 DIAGNOSIS — D45 Polycythemia vera: Secondary | ICD-10-CM | POA: Diagnosis not present

## 2020-05-30 DIAGNOSIS — C9200 Acute myeloblastic leukemia, not having achieved remission: Secondary | ICD-10-CM

## 2020-05-30 DIAGNOSIS — N2889 Other specified disorders of kidney and ureter: Secondary | ICD-10-CM | POA: Diagnosis not present

## 2020-05-30 DIAGNOSIS — E876 Hypokalemia: Secondary | ICD-10-CM | POA: Diagnosis not present

## 2020-05-30 LAB — COMPREHENSIVE METABOLIC PANEL
ALT: 10 U/L (ref 0–44)
AST: 27 U/L (ref 15–41)
Albumin: 2.5 g/dL — ABNORMAL LOW (ref 3.5–5.0)
Alkaline Phosphatase: 69 U/L (ref 38–126)
Anion gap: 8 (ref 5–15)
BUN: 24 mg/dL — ABNORMAL HIGH (ref 8–23)
CO2: 25 mmol/L (ref 22–32)
Calcium: 8.3 mg/dL — ABNORMAL LOW (ref 8.9–10.3)
Chloride: 100 mmol/L (ref 98–111)
Creatinine, Ser: 1.28 mg/dL — ABNORMAL HIGH (ref 0.61–1.24)
GFR calc Af Amer: >60 mL/min (ref >60)
GFR calc non Af Amer: 55 mL/min — ABNORMAL LOW (ref >60)
Glucose, Bld: 100 mg/dL — ABNORMAL HIGH (ref 70–99)
Potassium: 3.6 mmol/L (ref 3.5–5.1)
Sodium: 133 mmol/L — ABNORMAL LOW (ref 135–145)
Total Bilirubin: 1.1 mg/dL (ref 0.3–1.2)
Total Protein: 8.7 g/dL — ABNORMAL HIGH (ref 6.5–8.1)

## 2020-05-30 LAB — TYPE AND SCREEN
ABO/RH(D): O POS
Antibody Screen: NEGATIVE
Unit division: 0
Unit division: 0

## 2020-05-30 LAB — BPAM RBC
Blood Product Expiration Date: 202109212359
Blood Product Expiration Date: 202109282359
ISSUE DATE / TIME: 202109071014
ISSUE DATE / TIME: 202109071336
Unit Type and Rh: 5100
Unit Type and Rh: 5100

## 2020-05-30 LAB — PREPARE RBC (CROSSMATCH)

## 2020-05-30 MED ORDER — AZACITIDINE CHEMO SQ INJECTION
150.0000 mg | Freq: Once | INTRAMUSCULAR | Status: AC
  Administered 2020-05-30: 150 mg via SUBCUTANEOUS
  Filled 2020-05-30: qty 6

## 2020-05-30 NOTE — Telephone Encounter (Signed)
-----   Message from Lequita Asal, MD sent at 05/30/2020 12:30 PM EDT ----- Regarding: Please call patient  Calcium level is good (corrected to 9.575 after allowing for albumen).  M ----- Message ----- From: Buel Ream, Lab In [place] Sent: 05/29/2020   8:48 AM EDT To: Lequita Asal, MD

## 2020-05-30 NOTE — Progress Notes (Signed)
On 05/29/20, 2nd unit of PRBC was started at 1345. Unable to scan in Epic due to error code that popped up. Blood was checked by 2 RN's and lab personnel was also here doing an audit, so blood was triple checked for accuracy. 300 cc of irradiated PRBC given without difficulty and documented on paper. Blood transfusion was stopped at 1459. Please see scanned document under media tab in Epic.

## 2020-05-30 NOTE — Telephone Encounter (Signed)
Spoke to patients wife, she will let him know

## 2020-05-31 ENCOUNTER — Inpatient Hospital Stay: Payer: Medicare Other

## 2020-05-31 ENCOUNTER — Other Ambulatory Visit: Payer: Self-pay | Admitting: Hematology and Oncology

## 2020-05-31 ENCOUNTER — Other Ambulatory Visit: Payer: Self-pay | Admitting: *Deleted

## 2020-05-31 ENCOUNTER — Other Ambulatory Visit: Payer: Self-pay

## 2020-05-31 DIAGNOSIS — C9200 Acute myeloblastic leukemia, not having achieved remission: Secondary | ICD-10-CM

## 2020-05-31 DIAGNOSIS — D6481 Anemia due to antineoplastic chemotherapy: Secondary | ICD-10-CM

## 2020-05-31 DIAGNOSIS — N2889 Other specified disorders of kidney and ureter: Secondary | ICD-10-CM | POA: Diagnosis not present

## 2020-05-31 DIAGNOSIS — D649 Anemia, unspecified: Secondary | ICD-10-CM

## 2020-05-31 DIAGNOSIS — D45 Polycythemia vera: Secondary | ICD-10-CM | POA: Diagnosis not present

## 2020-05-31 DIAGNOSIS — Z5111 Encounter for antineoplastic chemotherapy: Secondary | ICD-10-CM | POA: Diagnosis not present

## 2020-05-31 DIAGNOSIS — E876 Hypokalemia: Secondary | ICD-10-CM

## 2020-05-31 DIAGNOSIS — R6881 Early satiety: Secondary | ICD-10-CM | POA: Diagnosis not present

## 2020-05-31 LAB — CBC WITH DIFFERENTIAL/PLATELET
Abs Immature Granulocytes: 0.1 10*3/uL — ABNORMAL HIGH (ref 0.00–0.07)
Basophils Absolute: 0 10*3/uL (ref 0.0–0.1)
Basophils Relative: 1 %
Eosinophils Absolute: 0 10*3/uL (ref 0.0–0.5)
Eosinophils Relative: 0 %
HCT: 28.3 % — ABNORMAL LOW (ref 39.0–52.0)
Hemoglobin: 9.3 g/dL — ABNORMAL LOW (ref 13.0–17.0)
Lymphocytes Relative: 26 %
Lymphs Abs: 0.8 10*3/uL (ref 0.7–4.0)
MCH: 28.4 pg (ref 26.0–34.0)
MCHC: 32.9 g/dL (ref 30.0–36.0)
MCV: 86.5 fL (ref 80.0–100.0)
Metamyelocytes Relative: 3 %
Monocytes Absolute: 0.4 10*3/uL (ref 0.1–1.0)
Monocytes Relative: 13 %
Myelocytes: 2 %
Neutro Abs: 1.5 10*3/uL — ABNORMAL LOW (ref 1.7–7.7)
Neutrophils Relative %: 51 %
Other: 4 %
Platelets: 71 10*3/uL — ABNORMAL LOW (ref 150–400)
RBC Morphology: NONE SEEN
RBC: 3.27 MIL/uL — ABNORMAL LOW (ref 4.22–5.81)
RDW: 18.2 % — ABNORMAL HIGH (ref 11.5–15.5)
WBC: 2.9 10*3/uL — ABNORMAL LOW (ref 4.0–10.5)
nRBC: 6.9 % — ABNORMAL HIGH (ref 0.0–0.2)
nRBC: 7 /100 WBC — ABNORMAL HIGH

## 2020-05-31 LAB — BASIC METABOLIC PANEL
Anion gap: 4 — ABNORMAL LOW (ref 5–15)
BUN: 23 mg/dL (ref 8–23)
CO2: 27 mmol/L (ref 22–32)
Calcium: 8.3 mg/dL — ABNORMAL LOW (ref 8.9–10.3)
Chloride: 101 mmol/L (ref 98–111)
Creatinine, Ser: 1.33 mg/dL — ABNORMAL HIGH (ref 0.61–1.24)
GFR calc Af Amer: >60 mL/min (ref >60)
GFR calc non Af Amer: 53 mL/min — ABNORMAL LOW (ref >60)
Glucose, Bld: 90 mg/dL (ref 70–99)
Potassium: 3.9 mmol/L (ref 3.5–5.1)
Sodium: 132 mmol/L — ABNORMAL LOW (ref 135–145)

## 2020-05-31 LAB — MAGNESIUM: Magnesium: 1.8 mg/dL (ref 1.7–2.4)

## 2020-06-01 ENCOUNTER — Inpatient Hospital Stay (HOSPITAL_BASED_OUTPATIENT_CLINIC_OR_DEPARTMENT_OTHER): Payer: Medicare Other | Admitting: Hospice and Palliative Medicine

## 2020-06-01 ENCOUNTER — Inpatient Hospital Stay: Payer: Medicare Other

## 2020-06-01 ENCOUNTER — Ambulatory Visit: Payer: Medicare Other

## 2020-06-01 ENCOUNTER — Encounter (INDEPENDENT_AMBULATORY_CARE_PROVIDER_SITE_OTHER): Payer: Medicare Other | Admitting: Family Medicine

## 2020-06-01 DIAGNOSIS — C9200 Acute myeloblastic leukemia, not having achieved remission: Secondary | ICD-10-CM

## 2020-06-01 DIAGNOSIS — N2889 Other specified disorders of kidney and ureter: Secondary | ICD-10-CM | POA: Diagnosis not present

## 2020-06-01 DIAGNOSIS — N401 Enlarged prostate with lower urinary tract symptoms: Secondary | ICD-10-CM | POA: Diagnosis not present

## 2020-06-01 DIAGNOSIS — N189 Chronic kidney disease, unspecified: Secondary | ICD-10-CM

## 2020-06-01 DIAGNOSIS — K5090 Crohn's disease, unspecified, without complications: Secondary | ICD-10-CM | POA: Diagnosis not present

## 2020-06-01 DIAGNOSIS — N1330 Unspecified hydronephrosis: Secondary | ICD-10-CM

## 2020-06-01 DIAGNOSIS — R338 Other retention of urine: Secondary | ICD-10-CM | POA: Diagnosis not present

## 2020-06-01 DIAGNOSIS — Z5111 Encounter for antineoplastic chemotherapy: Secondary | ICD-10-CM | POA: Diagnosis not present

## 2020-06-01 DIAGNOSIS — R6881 Early satiety: Secondary | ICD-10-CM | POA: Diagnosis not present

## 2020-06-01 DIAGNOSIS — D6481 Anemia due to antineoplastic chemotherapy: Secondary | ICD-10-CM

## 2020-06-01 DIAGNOSIS — E876 Hypokalemia: Secondary | ICD-10-CM

## 2020-06-01 DIAGNOSIS — Z515 Encounter for palliative care: Secondary | ICD-10-CM | POA: Diagnosis not present

## 2020-06-01 DIAGNOSIS — N138 Other obstructive and reflux uropathy: Secondary | ICD-10-CM

## 2020-06-01 DIAGNOSIS — D630 Anemia in neoplastic disease: Secondary | ICD-10-CM | POA: Diagnosis not present

## 2020-06-01 DIAGNOSIS — D6959 Other secondary thrombocytopenia: Secondary | ICD-10-CM | POA: Diagnosis not present

## 2020-06-01 DIAGNOSIS — D45 Polycythemia vera: Secondary | ICD-10-CM

## 2020-06-01 LAB — CBC
HCT: 27.6 % — ABNORMAL LOW (ref 39.0–52.0)
Hemoglobin: 9.2 g/dL — ABNORMAL LOW (ref 13.0–17.0)
MCH: 28.8 pg (ref 26.0–34.0)
MCHC: 33.3 g/dL (ref 30.0–36.0)
MCV: 86.5 fL (ref 80.0–100.0)
Platelets: 57 10*3/uL — ABNORMAL LOW (ref 150–400)
RBC: 3.19 MIL/uL — ABNORMAL LOW (ref 4.22–5.81)
RDW: 18.1 % — ABNORMAL HIGH (ref 11.5–15.5)
WBC: 3.3 10*3/uL — ABNORMAL LOW (ref 4.0–10.5)
nRBC: 6.1 % — ABNORMAL HIGH (ref 0.0–0.2)

## 2020-06-01 MED ORDER — SODIUM CHLORIDE 0.9% FLUSH
10.0000 mL | Freq: Once | INTRAVENOUS | Status: AC
  Administered 2020-06-01: 10 mL via INTRAVENOUS
  Filled 2020-06-01: qty 10

## 2020-06-01 MED ORDER — HEPARIN SOD (PORK) LOCK FLUSH 100 UNIT/ML IV SOLN
500.0000 [IU] | Freq: Once | INTRAVENOUS | Status: AC
  Administered 2020-06-01: 500 [IU] via INTRAVENOUS
  Filled 2020-06-01: qty 5

## 2020-06-01 MED ORDER — ONDANSETRON 4 MG PO TBDP: 4.0000 mg | ORAL_TABLET | Freq: Three times a day (TID) | ORAL | 0 refills | Status: AC | PRN

## 2020-06-01 NOTE — Progress Notes (Signed)
[place]  [phone] [phone]   Name: Ruben [name] Date: 06/01/2020 MRN: [id number]  DOB: [birth year]  Patient Care Team: [name], MD as PCP - General (Family Medicine) [name], MD (Inactive) (Gastroenterology) Dingeldein, Remo Lipps, MD (Ophthalmology) Lequita Asal, MD as Referring Physician (Hematology and Oncology) Dasher, Rayvon Char, MD as Referring Physician (Dermatology) Anthonette Legato, MD as Consulting Physician (Internal Medicine) Hollice Espy, MD as Consulting Physician (Urology) Crissie Sickles, MD as Referring Physician (Hematology and Oncology) Vladimir Crofts, MD as Consulting Physician (Neurology)    REASON FOR CONSULTATION: Ruben [name] is a 74 y.o. 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  He has had progressive decline with fatigue and poor oral intake.  Patient was referred to palliative care to help address goals and manage ongoing symptoms.  SOCIAL HISTORY:     reports that he quit smoking about 16 years ago. His smoking use included cigarettes. He has a 5.00 pack-year smoking history. He has never used smokeless tobacco. He reports previous alcohol use of about 4.0 - 5.0 standard drinks of alcohol per week. He reports that he does not use drugs.   Patient is married and lives at home with his wife.  He has 2 adult children, one of whom lives in [place] and the other lives locally.  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  ADVANCE DIRECTIVES:  On file  CODE STATUS:   PAST MEDICAL HISTORY: Past Medical History:  Diagnosis Date  . Blood dyscrasia   . BPH (benign prostatic hyperplasia)   . Cancer ([place])    SKIN/ POLYCYTHEMIA VERA  . Chronic kidney disease    RENAL INSUFF (40%)  . Crohn disease ([place])   .  Crohn's disease ([place])   . Glaucoma   . Glaucoma   . Gout   . History of chicken pox   . Myelofibrosis ([place])   . Nosebleed   . RBBB   . Right bundle branch block     PAST SURGICAL HISTORY:  Past Surgical History:  Procedure Laterality Date  . CATARACT EXTRACTION W/PHACO Left 07/13/2018   Procedure: CATARACT EXTRACTION PHACO AND INTRAOCULAR LENS PLACEMENT (IOC);  Surgeon: [name], MD;  Location: [facility] ORS;  Service: Ophthalmology;  Laterality: Left;  [place] 00:39  CDE 5.14 Fluid pack lot # [id number]  . CATARACT EXTRACTION W/PHACO Right 08/03/2018   Procedure: CATARACT EXTRACTION PHACO AND INTRAOCULAR LENS PLACEMENT (IOC);  Surgeon: [name], MD;  Location: [facility] ORS;  Service: Ophthalmology;  Laterality: Right;  [place] 00:43.4 CDE 5.23 Fluid pack Lot # [id number]  . COLONOSCOPY WITH PROPOFOL N/A 03/27/2017   Procedure: COLONOSCOPY WITH PROPOFOL;  Surgeon: [name], MD;  Location: [facility] ENDOSCOPY;  Service: Endoscopy;  Laterality: N/A;  . IR FLUORO GUIDE CV LINE LEFT  05/18/2020  . MOHS SURGERY     EAR  . PROSTATE SURGERY  2008   Prostate Biopsy in [place] to elevated PSA.  per patient normal  . Skin Lesion Basal cell removed    . wart removal     from eyelid    HEMATOLOGY/ONCOLOGY HISTORY:  Oncology History  Acute myeloid leukemia not having achieved remission ([place])  03/07/2020 Initial Diagnosis   Acute myeloid leukemia not having achieved remission ([place])   05/21/2020 -  Chemotherapy   The patient had azaCITIDine (VIDAZA) chemo  injection 150 mg, 78.5 mg/m2 = 142.5 mg, Subcutaneous,  Once, 1 of 4 cycles Administration: 150 mg (05/21/2020), 150 mg (05/22/2020), 150 mg (05/23/2020), 150 mg (05/24/2020), 150 mg (05/25/2020), 150 mg (05/29/2020), 150 mg (05/30/2020)  for chemotherapy treatment.      ALLERGIES:  has No Known Allergies.  MEDICATIONS:  Current Outpatient Medications  Medication Sig Dispense Refill  . acetaminophen (TYLENOL) 500 MG tablet Take 500 mg by  mouth daily as needed for moderate pain.    Marland Kitchen allopurinol (ZYLOPRIM) 300 MG tablet TAKE 1/2 TABLET (150 MG TOTAL) BY MOUTH DAILY. 45 tablet 1  . Cyanocobalamin (RA VITAMIN B-12 TR) 1000 MCG TBCR Take 1,000 mcg by mouth daily at 12 noon.     . cyanocobalamin 1000 MCG tablet Take by mouth.     . finasteride (PROSCAR) 5 MG tablet Take 1 tablet (5 mg total) by mouth every evening. 90 tablet 3  . folic acid (FOLVITE) 1 MG tablet Take 1 mg by mouth daily at 12 noon.     . latanoprost (XALATAN) 0.005 % ophthalmic solution Place 1 drop into both eyes at bedtime.     Marland Kitchen levofloxacin (LEVAQUIN) 250 MG tablet Take 250 mg by mouth daily. (Patient not taking: Reported on 05/29/2020)    . midodrine (PROAMATINE) 5 MG tablet Take 1 tablet (5 mg total) by mouth 3 (three) times daily with meals. 90 tablet 0  . POSACONAZOLE PO Take 300 mg by mouth daily.    . potassium chloride SA (KLOR-CON) 20 MEQ tablet Take 1 tablet (20 mEq total) by mouth daily. (Patient taking differently: Take 20 mEq by mouth every other day. Patient taking every other day) 30 tablet 0  . sodium bicarbonate 650 MG tablet Take 650 mg by mouth 2 (two) times daily.    . timolol (TIMOPTIC) 0.5 % ophthalmic solution Place 1 drop into both eyes daily. In AM  5  . valACYclovir (VALTREX) 500 MG tablet Take 500 mg by mouth daily.     No current facility-administered medications for this visit.    VITAL SIGNS: There were no vitals taken for this visit. There were no vitals filed for this visit.  Estimated body mass index is 22.02 kg/m as calculated from the following:   Height as of 05/29/20: 6' (1.829 m).   Weight as of 05/29/20: 162 lb 5.9 oz (73.6 kg).  LABS: CBC:    Component Value Date/Time   WBC 3.3 (L) 06/01/2020 0832   HGB 9.2 (L) 06/01/2020 0832   HGB 19.3 (H) 08/24/2012 1632   HCT 27.6 (L) 06/01/2020 0832   HCT 59.8 (H) 08/24/2012 1632   PLT 57 (L) 06/01/2020 0832   PLT 551 (H) 08/24/2012 1632   MCV 86.5 06/01/2020 0832   MCV 84  08/24/2012 1632   NEUTROABS 1.5 (L) 05/31/2020 1010   LYMPHSABS 0.8 05/31/2020 1010   MONOABS 0.4 05/31/2020 1010   EOSABS 0.0 05/31/2020 1010   BASOSABS 0.0 05/31/2020 1010   Comprehensive Metabolic Panel:    Component Value Date/Time   NA 132 (L) 05/31/2020 1010   NA 137 08/24/2012 1632   K 3.9 05/31/2020 1010   K 4.2 08/24/2012 1632   CL 101 05/31/2020 1010   CL 108 (H) 08/24/2012 1632   CO2 27 05/31/2020 1010   CO2 21 08/24/2012 1632   BUN 23 05/31/2020 1010   BUN 18 08/24/2012 1632   CREATININE 1.33 (H) 05/31/2020 1010   CREATININE 0.97 08/24/2012 1632   GLUCOSE 90 05/31/2020 1010  GLUCOSE 113 (H) 08/24/2012 1632   CALCIUM 8.3 (L) 05/31/2020 1010   CALCIUM 9.6 08/24/2012 1632   AST 27 05/29/2020 0819   AST 42 (H) 08/24/2012 1632   ALT 10 05/29/2020 0819   ALT 41 08/24/2012 1632   ALKPHOS 69 05/29/2020 0819   ALKPHOS 155 (H) 08/24/2012 1632   BILITOT 1.1 05/29/2020 0819   BILITOT 0.9 08/24/2012 1632   PROT 8.7 (H) 05/29/2020 0819   PROT 8.6 (H) 08/24/2012 1632   ALBUMIN 2.5 (L) 05/29/2020 0819   ALBUMIN 4.1 08/24/2012 1632    RADIOGRAPHIC STUDIES: IR Fluoro Guide CV Line Left  Result Date: 05/18/2020 CLINICAL DATA:  AML, needs venous access for treatment regimen. Renal insufficiency. EXAM: TUNNELED CENTRAL VENOUS CATHETER PLACEMENT WITH ULTRASOUND AND FLUOROSCOPIC GUIDANCE TECHNIQUE: The procedure, risks, benefits, and alternatives were explained to the patient. Questions regarding the procedure were encouraged and answered. The patient understands and consents to the procedure. Patency of the right IJ vein was confirmed with ultrasound with image documentation. An appropriate skin site was determined. Region was prepped using maximum barrier technique including cap and mask, sterile gown, sterile gloves, large sterile sheet, and Chlorhexidine as cutaneous antisepsis. The region was infiltrated locally with 1% lidocaine. Under real-time ultrasound guidance, the right  IJ vein was accessed with a 21 gauge micropuncture needle; the needle tip within the vein was confirmed with ultrasound image documentation. 50F single-lumen cuffed PowerLine tunneled from a right anterior chest wall approach to the dermatotomy site. Needle exchanged over the 018 guidewire for transitional dilator, through which the catheter which had been cut to 22 cm was advanced under intermittent fluoroscopy, positioned with its tip at the cavoatrial junction. Spot chest radiograph confirms good catheter position. No pneumothorax. Catheter was flushed per protocol. Catheter secured externally with O Prolene suture. The right IJ dermatotomy site was closed with Dermabond. COMPLICATIONS: COMPLICATIONS None immediate FLUOROSCOPY TIME:  Less than 0.1 minute; 0.25 mGy COMPARISON:  None IMPRESSION: 1. Technically successful placement of tunneled right IJ tunneled single-lumen power injectable catheter with ultrasound and fluoroscopic guidance. Ready for routine use. Electronically Signed   By: Lucrezia Europe M.D.   On: 05/18/2020 11:33    PERFORMANCE STATUS (ECOG) : 2 - Symptomatic, <50% confined to bed  Review of Systems Unless otherwise noted, a complete review of systems is negative.  Physical Exam General: NAD, thin Cardiovascular: regular rate and rhythm Pulmonary: clear ant fields Abdomen: soft, nontender, + bowel sounds GU: no suprapubic tenderness Extremities: + edema, no joint deformities Skin: no rashes Neurological: Weakness but otherwise nonfocal  IMPRESSION: Patient seen in infusion area.  Hemoglobin stable today and he does not appear to require transfusion.  Plan is for follow-up next week with Dr. Mike Gip.  Patient is pending repeat bone marrow biopsy.  Patient volunteered that he is considering transitioning to comfort care if no significant improvement on bone marrow biopsy.  He says he feels that his wife will need increased support and we briefly discussed the option of hospice care.   Patient states that he and his wife are discussing end-of-life decision-making.  Patient says he has decided not to have resuscitation nor any life prolonging measures.  He says he does not think he would be interested in hospitalization even if something could be potentially treatable and would instead want to stay home and focus on comfort and end-of-life care.  Patient asked that I met separately with his wife. Patient's wife plans to talk through MOST form with patient this weekend.  Symptomatically, patient had a couple days this week of nausea and vomiting.  This improved with use of ondansetron.  Will refill ondansetron.  PLAN: -Continue current scope of treatment -Refill ondansetron 4 mg ODT every 8 hours as needed -MOST form reviewed -RTC next week  Case and plan discussed with Dr. Mike Gip  Patient expressed understanding and was in agreement with this plan. He also understands that He can call the clinic at any time with any questions, concerns, or complaints.     Time Total: 45 minutes  Visit consisted of counseling and education dealing with the complex and emotionally intense issues of symptom management and palliative care in the setting of serious and potentially life-threatening illness.Greater than 50%  of this time was spent counseling and coordinating care related to the above assessment and plan.  Signed by: Altha Harm, PhD, NP-C

## 2020-06-01 NOTE — Progress Notes (Signed)
CBC drawn today. Labs reviewed with MD, per MD patient does not need blood transfusion today and may be discharged. Pt updated and all questions answered at this time. Central line dressing changed and cleaned. Pt stable for discharge.   Ruben [name] [employer]

## 2020-06-02 ENCOUNTER — Other Ambulatory Visit: Payer: Self-pay | Admitting: Hematology and Oncology

## 2020-06-02 DIAGNOSIS — Z148 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: Secondary | ICD-10-CM

## 2020-06-02 DIAGNOSIS — D696 Thrombocytopenia, unspecified: Secondary | ICD-10-CM | POA: Insufficient documentation

## 2020-06-03 ENCOUNTER — Other Ambulatory Visit: Payer: Self-pay | Admitting: Hematology and Oncology

## 2020-06-03 DIAGNOSIS — E876 Hypokalemia: Secondary | ICD-10-CM

## 2020-06-03 NOTE — Progress Notes (Signed)
[facility]  [address], [address] [place], [place] [address] Phone: [phone]  Fax: [phone]   Clinic Day:  06/04/2020  Referring physician: [name], MD  Chief Complaint: CONG Ruben [name] is a 74 y.o. 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  HPI: The patient was last seen in the was last seen in the hematology clinic on 05/29/2020.  At that time, he felt "fair".  He remained fatigued.  Exam revealed splenomegaly. Hematocrit was 24.0, hemoglobin 7.8, MCV 86.3, platelets 106,000, WBC 2,900. Sodium was 133. Creatinine was 1.28 (CrCl 55 ml/min). Calcium was 8.3. Albumin was 2.5. Magnsium was 1.7. He received day 6 azacitidine.  He received 2 units of PRBCs.  He saw Billey Chang, NP on 06/01/2020. The patient stated that he is considering transitioning to comfort care if there is no significant improvement on his bone marrow biopsy. Hospice care was discussed. The patient and his wife were discussing end of life decison making; he had decided that he did not want resuscitation nor any life prolonging measures.  Labs followed:  05/31/2020: Hematocrit 28.3, hemoglobin 9.3, MCV 86.5, platelets 71,000, WBC 2,900 (ANC 1,500). Sodium 132. Creatinine 1.33 (CrCl 53 ml/min). Calcium 8.3.  06/01/2020: Hematocrit 27.6, hemoglobin 9.2, MCV 86.5, platelets 57,000, WBC 3,300.  During the interim, he has been "up and around." His weekend went well. He spent time with his brother and sister in law. His diarrhea comes and goes. He has nausea and vomiting for a couple of days, which was relieved with ondansetron. He denies bruising, bleeding, shortness of breath, cough, and fevers. He states that his legs get tired if he walks too much.   The patient has been eating "a little bit of everything," though he still has a poor appetite. He still has early satiety and eats slowly.  He takes potassium every other day.   Past Medical History:  Diagnosis Date  . Blood dyscrasia   . BPH (benign prostatic hyperplasia)   . Cancer ([place])    SKIN/ POLYCYTHEMIA VERA  . Chronic kidney disease    RENAL INSUFF (40%)  . Crohn disease ([place])   . Crohn's disease ([place])   . Glaucoma   . Glaucoma   . Gout   . History of chicken pox   . Myelofibrosis ([place])   . Nosebleed   . RBBB   . Right bundle branch block     Past Surgical History:  Procedure Laterality Date  . CATARACT EXTRACTION W/PHACO Left 07/13/2018   Procedure: CATARACT EXTRACTION PHACO AND INTRAOCULAR LENS PLACEMENT (IOC);  Surgeon: [name], MD;  Location: [facility] ORS;  Service: Ophthalmology;  Laterality: Left;  [place] 00:39  CDE 5.14 Fluid pack lot # [id number]  . CATARACT EXTRACTION W/PHACO Right 08/03/2018   Procedure: CATARACT EXTRACTION PHACO AND INTRAOCULAR LENS PLACEMENT (IOC);  Surgeon: [name], MD;  Location: [facility] ORS;  Service: Ophthalmology;  Laterality: Right;  [place] 00:43.4 CDE 5.23 Fluid pack Lot # [id number]  . COLONOSCOPY WITH PROPOFOL N/A 03/27/2017   Procedure: COLONOSCOPY WITH PROPOFOL;  Surgeon: [name], MD;  Location: [facility] ENDOSCOPY;  Service: Endoscopy;  Laterality: N/A;  . IR FLUORO GUIDE CV LINE LEFT  05/18/2020  . MOHS SURGERY     EAR  . PROSTATE SURGERY  2008   Prostate Biopsy in [place] to elevated PSA.  per patient normal  . Skin Lesion Basal cell removed    .  wart removal     from eyelid    Family History  Problem Relation Age of Onset  . Cancer Mother        Liver  . Cancer Father        Prostate  . Cancer Sister        Hodgkins lymphoma  . Cancer Paternal Aunt        Breast    Social History:  reports that he quit smoking about 16 years ago. His smoking use included cigarettes. He has a 5.00 pack-year smoking history. He has never used smokeless tobacco. He reports previous alcohol use of about 4.0 - 5.0 standard drinks of alcohol per week. He  reports that he does not use drugs. The patientpreviously worked for [employer]. He no longer volunteers for [employer]. The patient's wife isJudith([phone]).He lives in [place]. The patient is accompanied by [name] today.  Allergies: No Known Allergies  Current Medications: Current Outpatient Medications  Medication Sig Dispense Refill  . allopurinol (ZYLOPRIM) 300 MG tablet TAKE 1/2 TABLET (150 MG TOTAL) BY MOUTH DAILY. 45 tablet 1  . Cyanocobalamin (RA VITAMIN B-12 TR) 1000 MCG TBCR Take 1,000 mcg by mouth daily at 12 noon.     . cyanocobalamin 1000 MCG tablet Take by mouth.     . folic acid (FOLVITE) 1 MG tablet Take 1 mg by mouth daily at 12 noon.     . latanoprost (XALATAN) 0.005 % ophthalmic solution Place 1 drop into both eyes at bedtime.     Marland Kitchen levofloxacin (LEVAQUIN) 250 MG tablet Take 250 mg by mouth daily.     . midodrine (PROAMATINE) 5 MG tablet Take 1 tablet (5 mg total) by mouth 3 (three) times daily with meals. 90 tablet 0  . ondansetron (ZOFRAN-ODT) 4 MG disintegrating tablet Take 1 tablet (4 mg total) by mouth every 8 (eight) hours as needed for nausea or vomiting. 45 tablet 0  . sodium bicarbonate 650 MG tablet Take 650 mg by mouth 2 (two) times daily.    . timolol (TIMOPTIC) 0.5 % ophthalmic solution Place 1 drop into both eyes daily. In AM  5  . valACYclovir (VALTREX) 500 MG tablet Take 500 mg by mouth daily.    . VENCLEXTA 100 MG TABS     . acetaminophen (TYLENOL) 500 MG tablet Take 500 mg by mouth daily as needed for moderate pain. (Patient not taking: Reported on 06/04/2020)    . finasteride (PROSCAR) 5 MG tablet Take 1 tablet (5 mg total) by mouth every evening. (Patient not taking: Reported on 06/04/2020) 90 tablet 3  . POSACONAZOLE PO Take 300 mg by mouth daily. (Patient not taking: Reported on 06/04/2020)    . potassium chloride SA (KLOR-CON M20) 20 MEQ tablet Take 1 tablet (20 mEq total) by mouth every other day. Patient taking every other day  (Patient not taking: Reported on 06/04/2020) 30 tablet 0   No current facility-administered medications for this visit.    Review of Systems  Constitutional: Positive for weight loss (6 lbs). Negative for chills, diaphoresis, fever and malaise/fatigue.       Has been "up and about" for the past few days.  HENT: Negative for congestion, ear discharge, ear pain, hearing loss, nosebleeds, sinus pain, sore throat and tinnitus.   Eyes: Negative for blurred vision.  Respiratory: Negative for cough, hemoptysis, sputum production and shortness of breath.   Cardiovascular: Negative for chest pain, palpitations and leg swelling.  Gastrointestinal: Positive for diarrhea (on and  off), nausea (a couple of days) and vomiting (a couple of days). Negative for abdominal pain, blood in stool, constipation, heartburn and melena.       Early satiety. Decreased appetite. Eats slowly.  Genitourinary: Negative for dysuria, frequency, hematuria and urgency.  Musculoskeletal: Negative for back pain, joint pain, myalgias and neck pain.  Skin: Negative for itching and rash.  Neurological: Positive for weakness (legs). Negative for dizziness, tingling, sensory change and headaches.  Endo/Heme/Allergies: Does not bruise/bleed easily.  Psychiatric/Behavioral: Negative for depression and memory loss. The patient is not nervous/anxious and does not have insomnia.   All other systems reviewed and are negative.  Performance status (ECOG): 2  Vitals Blood pressure (!) 146/77, pulse 71, temperature 97.6 F (36.4 C), temperature source Tympanic, resp. rate 18, weight 156 lb 1.4 oz (70.8 kg), SpO2 100 %.   Physical Exam Vitals and nursing note reviewed.  Constitutional:      General: He is not in acute distress.    Appearance: He is not diaphoretic.     Comments: Chronically fatigued appearing gentleman in no acute distress.   HENT:     Head: Normocephalic and atraumatic.     Comments: Short thin gray hair with  temporal wasting.    Mouth/Throat:     Mouth: Mucous membranes are moist.     Pharynx: Oropharynx is clear.  Eyes:     General: No scleral icterus.    Extraocular Movements: Extraocular movements intact.     Conjunctiva/sclera: Conjunctivae normal.     Pupils: Pupils are equal, round, and reactive to light.  Cardiovascular:     Rate and Rhythm: Normal rate and regular rhythm.     Heart sounds: Normal heart sounds. No murmur heard.   Pulmonary:     Effort: Pulmonary effort is normal. No respiratory distress.     Breath sounds: Normal breath sounds. No wheezing or rales.  Chest:     Chest wall: No tenderness.  Abdominal:     General: Bowel sounds are normal. There is no distension.     Palpations: There is splenomegaly (palpable 3-4 fingerbreadths below left costal margin). There is no hepatomegaly or mass.     Tenderness: There is no abdominal tenderness. There is no guarding or rebound.  Musculoskeletal:        General: No swelling or tenderness. Normal range of motion.     Cervical back: Normal range of motion and neck supple.  Lymphadenopathy:     Head:     Right side of head: No preauricular, posterior auricular or occipital adenopathy.     Left side of head: No preauricular, posterior auricular or occipital adenopathy.     Cervical: No cervical adenopathy.     Upper Body:     Right upper body: No supraclavicular or axillary adenopathy.     Left upper body: No supraclavicular or axillary adenopathy.     Lower Body: No right inguinal adenopathy. No left inguinal adenopathy.  Skin:    General: Skin is warm and dry.  Neurological:     Mental Status: He is alert and oriented to person, place, and time.  Psychiatric:        Behavior: Behavior normal.        Thought Content: Thought content normal.        Judgment: Judgment normal.    Appointment on 06/04/2020  Component Date Value Ref Range Status  . Sodium 06/04/2020 134* 135 - 145 mmol/L Final  . Potassium 06/04/2020  3.5  3.5 - 5.1 mmol/L Final  . Chloride 06/04/2020 102  98 - 111 mmol/L Final  . CO2 06/04/2020 26  22 - 32 mmol/L Final  . Glucose, Bld 06/04/2020 86  70 - 99 mg/dL Final   Glucose reference range applies only to samples taken after fasting for at least 8 hours.  . BUN 06/04/2020 20  8 - 23 mg/dL Final  . Creatinine, Ser 06/04/2020 1.35* 0.61 - 1.24 mg/dL Final  . Calcium 06/04/2020 8.3* 8.9 - 10.3 mg/dL Final  . Total Protein 06/04/2020 9.1* 6.5 - 8.1 g/dL Final  . Albumin 06/04/2020 2.6* 3.5 - 5.0 g/dL Final  . AST 06/04/2020 27  15 - 41 U/L Final  . ALT 06/04/2020 11  0 - 44 U/L Final  . Alkaline Phosphatase 06/04/2020 66  38 - 126 U/L Final  . Total Bilirubin 06/04/2020 1.0  0.3 - 1.2 mg/dL Final  . GFR calc non Af Amer 06/04/2020 52* >60 mL/min Final  . GFR calc Af Amer 06/04/2020 60* >60 mL/min Final  . Anion gap 06/04/2020 6  5 - 15 Final   Performed at [facility], [address] [place], [place] [address]  . WBC 06/04/2020 3.6* 4.0 - 10.5 K/uL Final  . RBC 06/04/2020 2.97* 4.22 - 5.81 MIL/uL Final  . Hemoglobin 06/04/2020 8.5* 13.0 - 17.0 g/dL Final  . HCT 06/04/2020 26.2* 39 - 52 % Final  . MCV 06/04/2020 88.2  80.0 - 100.0 fL Final  . MCH 06/04/2020 28.6  26.0 - 34.0 pg Final  . MCHC 06/04/2020 32.4  30.0 - 36.0 g/dL Final  . RDW 06/04/2020 18.8* 11.5 - 15.5 % Final  . Platelets 06/04/2020 80* 150 - 400 K/uL Final   Comment: Immature Platelet Fraction may be clinically indicated, consider ordering this additional test [id number]   . nRBC 06/04/2020 7.8* 0.0 - 0.2 % Final   Performed at [facility], [address], [place], [place] [address]  . Neutrophils Relative % 06/04/2020 PENDING  % Incomplete  . Neutro Abs 06/04/2020 PENDING  1.7 - 7.7 K/uL Incomplete  . Band Neutrophils 06/04/2020 PENDING  % Incomplete  . Lymphocytes Relative 06/04/2020 PENDING  % Incomplete  . Lymphs Abs 06/04/2020 PENDING  0.7 - 4.0 K/uL Incomplete  . Monocytes  Relative 06/04/2020 PENDING  % Incomplete  . Monocytes Absolute 06/04/2020 PENDING  0 - 1 K/uL Incomplete  . Eosinophils Relative 06/04/2020 PENDING  % Incomplete  . Eosinophils Absolute 06/04/2020 PENDING  0 - 0 K/uL Incomplete  . Basophils Relative 06/04/2020 PENDING  % Incomplete  . Basophils Absolute 06/04/2020 PENDING  0 - 0 K/uL Incomplete  . WBC Morphology 06/04/2020 PENDING   Incomplete  . RBC Morphology 06/04/2020 PENDING   Incomplete  . Smear Review 06/04/2020 PENDING   Incomplete  . Other 06/04/2020 PENDING  % Incomplete  . nRBC 06/04/2020 PENDING  0 /100 WBC Incomplete  . Metamyelocytes Relative 06/04/2020 PENDING  % Incomplete  . Myelocytes 06/04/2020 PENDING  % Incomplete  . Promyelocytes Relative 06/04/2020 PENDING  % Incomplete  . Blasts 06/04/2020 PENDING  % Incomplete  . Immature Granulocytes 06/04/2020 PENDING  % Incomplete  . Abs Immature Granulocytes 06/04/2020 PENDING  0.00 - 0.07 K/uL Incomplete    Assessment:  HAWK [name] is a 74 y.o. 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 He has had polycythemia dating back to 2013. Hematocrit was 62.1 with a hemoglobin of 19.8 on 03/28/2015. JAK 2 testing on  03/28/2015 revealed [id number] mutation. Erythropoietin level was 1.1 (low). 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  He began a phlebotomyprogram on 03/28/2015 to maintain a hematocrit goal of <45.Last phlebotomywason 05/02/2015. He is on a baby aspirin.  He was initially on hydroxyurea beginning in 11/2015 then [facility].  Bone marrowon 12/14/2015 revealed a persistent myeloproliferative neoplasm with myelofibrosis and alterations compatible with myelodysplatic progression. Marrow was packed (95-100% cellularity) with pan myelosis, multi-lineage dyspoiesis, and no significant increase in blasts. There was moderate to focally marked reticulin fibrosis (grade 2-3/3). Storage iron was not identified. Flow cytometry revealed  non-specific atypical myeloid findings with no increase in blasts. 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 Cytogenetics were normal (46, XY).  Bone marrowon 04/07/2016 at [facility] revealed a hypercellular marrow (>95%) with persistent involvement by myeloid neoplasm with 5% blasts. There was mild reticulin fibrosis. FISH t(9;22) results were normal. 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 Cytogenetics are pending.  Bone marrowon 08/09/2018 revealed a hypercellular bone marrow (>95%) with persistent involvement by myeloid neoplasm with <1% blasts by manual touch preparation differential. There was marked reticulin fibrosis with focal collagen deposition. Cytogenetics were normal (46, XY). - Myeloid mutation panel study is pending.  Bone marrowon 07/14/2019 revealed a hypercellular marrow (>95%) with persistent involvement by a myeloid neoplasm with extensive fibrosis and rare (< 5%) CD34 +blast byIHC. Myeloid mutation panel revealed the patient's previously reported variants inIDH2, JAK2, KRAS, NRAS and SRSF2 were again identified. There are no new variants. Cytogenetics are pending.  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  Bone marrow on 05/08/2020 revealed persistent disease, but with 50% reduction in blasts (20% to 5-10%).   He was admitted to [facility] from 03/28/2020 - 04/04/2020 forcycle #1azacitidine and venetoclax.  Day #1 was 04/01/2020. He tolerated treatment well, with the exception of mild tumor lysis syndrome, diarrhea, epistaxis x 1, and mild hyponatremia. He completed a 5 day course ofceftriaxone andazithromycin forpneumonia.  He is currently day 40 s/p cycle #1 azacytidine and venetoclax.  Peripheral smear on 05/02/2020 showed absolute leukopenia, with circulating blasts, 6% by manual differential count.  He had a normocytic anemia,  with few nucleated RBCs.  There was thrombocytopenia, with unremarkable morphology.  Findings were compatible with treatment for acute leukemia.   He is currently day 15 of cycle #2 azacitidine and venetoclax (began 05/21/2020).  He is RBC transfusion dependent.  He receives PRBCs twice weekly.   He has urinary retention. Renal ultrasoundon 07/28/2016 revealed bilateral hydronephrosis (right >left) and a large postvoid residual (2 liters) suggesting bladder dysfunction or outlet obstruction. He underwent temporary Foley catheter placement. He performs I/O self catheterizations. He is on Flomax and finasteride. PSAwas 2.02 on 07/09/2016.  He is followed by GI (Dr. Vira Agar) for a history of polypsand Crohn's disease.He had an unremarkable colonoscopy in 03/2017.AbdomenandpelvisCTon 04/03/2019 revealed mild wall thickening of portions of a loop of small bowel within the pelvis with intervening areas of mildly dilated small bowel,consistent with active Crohn's disease. No evidence of an abscess or fistula. No other evidence of bowel inflammation.There was splenomegaly(16.7 x 7.7 x 15.6 cm), developed since12/11/2011.There was mild bladder wall thickeningandenlargement of the prostate. Chronic bladder outlet obstructionwas felt to bethe etiology of bladder wall thickening.  He has chronicrenal insufficiency(Cr 1.85; CrCl 36 ml/min). SPEP on 09/23/2016 was negative. Spot urine revealed 17.4% of 27.3 mg/dL of a monoclonal protein. 24 hour urine on 10/16/2016 revealed no monoclonal protein. Renal  function transiently decreased in 06/2017 after Bactrim. 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  He has a history of epistaxis. Normal studies included: PT, PTT, von Willebrand panel, and platelet function assay (PFA).  He has right foot drop. HeadMRIon 06/09/2019 showed strong evidence of diffuse osseous metastatic  disease. There was no non-contrast MRI evidence of brain metastasis. There was only mild for age nonspecific cerebral white signal changes. Lumbar spine MRIon 06/09/2019 showed diffusely abnormal marrow signal in the visible skeleton, as seen on the brain MRI. There was no pathologic fracture and no extraosseous extension of tumor on the lumbar spine or visible sacrum. In conjunction with the splenomegaly by CT in [place] infiltration byleukemia/lymphoma may be the most likely etiology. Differential considerations included multiple myeloma and metastatic disease unknown primary. The involvement of the pelvis should be amenable to bone marrow biopsy.There was superimposed mild for age lumbar spine degeneration with no spinal stenosis.There was mild degenerative L4 and L5 neural foraminal stenosis.  He has had a history of pneumoniasince 02/2020.  He was admitted to [facility] from 04/25/2020 - 04/30/2020 for sepsis due to pneumonia. He was treated with IV fluids and antibiotics. He received a total of 2 units of PRBCs (last 04/30/2020) for severe anemia. Hospital course was complicated by acute hypoxemic respiratory failure and pulmonary edema requiring BiPAP therapy and ICU transfer. He was treated for hypokalemia, hypomagnesemia, and hyponatremia.   Patient has a normocytic anemia.  He received 2 units of PRBCs at [facility] on 05/08/2020.   He received the Moderna COVID-19vaccineon 10/28/2019 and 11/29/2019. COVID-19 testing was negative on 02/29/2020.  Symptomatically, he has been "up and around."  Diarrhea comes and goes. He had nausea and vomiting relieved with ondansetron. His legs get tired if he walks too much.  Appetite is poor.  He has lost 6 pounds.  Exam is stable.  Plan: 1.   Labs today: CBC with diff, CMP, type and screen. 2.   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 He is day15 of cycle #2 azacitidine + Venetoclax  (began 05/21/2020). Patient taking venetoclax 100 mg a day.             Continue prophylactic antibiotics:                        Posoconazole 300 mg po q day. Levaquin 250 mg po q day. Valacyclovir 500 mg po q day.             Labs reviewed.  Counts recovering.   Bone marrow on 06/13/2020 at [facility]. 3. Transfusion dependent anemia Hematocrit 26.2.  Hemoglobin 8.5.  MCV 88.2.  Platelets 80,000.  WBC 3600 ([place] 2300).             Maintain hemoglobin > 8.0.  He has recurrent symptomatic anemia secondary to chemotherapy and AML.               Continue labs on Mondays and Thursdays with transfusion on Tuesdays and Fridays. No transfusion needed. 4. Renal insufficiency and hypokalemia Creatinine 1.35.  Potassium 3.5.             Patient continues I/O catheterization.   Continue potassium 20 mEq p.o.QOD. 5.   Code status             DNR/DNI  Patient declines heroic measures.  He is awaiting bone marrow results on 06/13/2020 regarding consideration of Hospice. 6.  Palliative care  Follow-up with Billey Chang, NP on Fridays in [place]. 7.   No transfusions (PRBCs or platelets) needed today. 8.   RTC on 09/15 for labs (CBC with diff, type and screen), and +/- PRBC transfusion. 9.   RTC on 09/17 in [place] for labs (CBC with diff, type and screen), and +/- PRBC transfusion. 10.   RTC on 09/20 for MD assess, labs (CBC with diff, CMP, type and screen), and +/- PRBC transfusion.  I discussed the assessment and treatment plan with the patient.  The patient was provided an opportunity to ask questions and all were answered.  The patient agreed with the plan and demonstrated an understanding of the instructions.  The patient was advised to call back if the symptoms worsen or if the condition fails to improve as anticipated.  I provided 19 minutes of face-to-face time during this this encounter and  > 50% was spent counseling as documented under my assessment and plan.   Lequita Asal, MD, PhD    06/04/2020, 11:14 AM  I, Mirian Mo Tufford, am acting as Education administrator for [employer]. Mike Gip, MD, PhD.  I, Melissa C. Mike Gip, MD, have reviewed the above documentation for accuracy and completeness, and I agree with the above.

## 2020-06-04 ENCOUNTER — Inpatient Hospital Stay: Payer: Medicare Other

## 2020-06-04 ENCOUNTER — Inpatient Hospital Stay (HOSPITAL_BASED_OUTPATIENT_CLINIC_OR_DEPARTMENT_OTHER): Payer: Medicare Other | Admitting: Hematology and Oncology

## 2020-06-04 ENCOUNTER — Other Ambulatory Visit: Payer: Self-pay

## 2020-06-04 ENCOUNTER — Telehealth: Payer: Self-pay

## 2020-06-04 ENCOUNTER — Encounter: Payer: Self-pay | Admitting: Hematology and Oncology

## 2020-06-04 VITALS — BP 146/77 | HR 71 | Temp 97.6°F | Resp 18 | Wt 156.1 lb

## 2020-06-04 DIAGNOSIS — Z7189 Other specified counseling: Secondary | ICD-10-CM | POA: Diagnosis not present

## 2020-06-04 DIAGNOSIS — Z148 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: Secondary | ICD-10-CM

## 2020-06-04 DIAGNOSIS — C9200 Acute myeloblastic leukemia, not having achieved remission: Secondary | ICD-10-CM | POA: Diagnosis not present

## 2020-06-04 DIAGNOSIS — T451X5A Adverse effect of antineoplastic and immunosuppressive drugs, initial encounter: Secondary | ICD-10-CM | POA: Diagnosis not present

## 2020-06-04 DIAGNOSIS — D649 Anemia, unspecified: Secondary | ICD-10-CM

## 2020-06-04 DIAGNOSIS — E876 Hypokalemia: Secondary | ICD-10-CM

## 2020-06-04 DIAGNOSIS — Z5111 Encounter for antineoplastic chemotherapy: Secondary | ICD-10-CM

## 2020-06-04 DIAGNOSIS — D6481 Anemia due to antineoplastic chemotherapy: Secondary | ICD-10-CM | POA: Diagnosis not present

## 2020-06-04 DIAGNOSIS — D45 Polycythemia vera: Secondary | ICD-10-CM | POA: Diagnosis not present

## 2020-06-04 DIAGNOSIS — N2889 Other specified disorders of kidney and ureter: Secondary | ICD-10-CM | POA: Diagnosis not present

## 2020-06-04 DIAGNOSIS — R6881 Early satiety: Secondary | ICD-10-CM | POA: Diagnosis not present

## 2020-06-04 DIAGNOSIS — N289 Disorder of kidney and ureter, unspecified: Secondary | ICD-10-CM | POA: Diagnosis not present

## 2020-06-04 LAB — COMPREHENSIVE METABOLIC PANEL
ALT: 11 U/L (ref 0–44)
AST: 27 U/L (ref 15–41)
Albumin: 2.6 g/dL — ABNORMAL LOW (ref 3.5–5.0)
Alkaline Phosphatase: 66 U/L (ref 38–126)
Anion gap: 6 (ref 5–15)
BUN: 20 mg/dL (ref 8–23)
CO2: 26 mmol/L (ref 22–32)
Calcium: 8.3 mg/dL — ABNORMAL LOW (ref 8.9–10.3)
Chloride: 102 mmol/L (ref 98–111)
Creatinine, Ser: 1.35 mg/dL — ABNORMAL HIGH (ref 0.61–1.24)
GFR calc Af Amer: 60 mL/min — ABNORMAL LOW (ref >60)
GFR calc non Af Amer: 52 mL/min — ABNORMAL LOW (ref >60)
Glucose, Bld: 86 mg/dL (ref 70–99)
Potassium: 3.5 mmol/L (ref 3.5–5.1)
Sodium: 134 mmol/L — ABNORMAL LOW (ref 135–145)
Total Bilirubin: 1 mg/dL (ref 0.3–1.2)
Total Protein: 9.1 g/dL — ABNORMAL HIGH (ref 6.5–8.1)

## 2020-06-04 LAB — TYPE AND SCREEN
ABO/RH(D): O POS
Antibody Screen: NEGATIVE
Unit division: 0

## 2020-06-04 LAB — CBC WITH DIFFERENTIAL/PLATELET
Abs Immature Granulocytes: 0.2 10*3/uL — ABNORMAL HIGH (ref 0.00–0.07)
Band Neutrophils: 7 %
Basophils Absolute: 0 10*3/uL (ref 0.0–0.1)
Basophils Relative: 1 %
Eosinophils Absolute: 0 10*3/uL (ref 0.0–0.5)
Eosinophils Relative: 0 %
HCT: 26.2 % — ABNORMAL LOW (ref 39.0–52.0)
Hemoglobin: 8.5 g/dL — ABNORMAL LOW (ref 13.0–17.0)
Lymphocytes Relative: 15 %
Lymphs Abs: 0.5 10*3/uL — ABNORMAL LOW (ref 0.7–4.0)
MCH: 28.6 pg (ref 26.0–34.0)
MCHC: 32.4 g/dL (ref 30.0–36.0)
MCV: 88.2 fL (ref 80.0–100.0)
Metamyelocytes Relative: 3 %
Monocytes Absolute: 0.4 10*3/uL (ref 0.1–1.0)
Monocytes Relative: 11 %
Myelocytes: 2 %
Neutro Abs: 2.3 10*3/uL (ref 1.7–7.7)
Neutrophils Relative %: 58 %
Other: 3 %
Platelets: 80 10*3/uL — ABNORMAL LOW (ref 150–400)
RBC Morphology: NONE SEEN
RBC: 2.97 MIL/uL — ABNORMAL LOW (ref 4.22–5.81)
RDW: 18.8 % — ABNORMAL HIGH (ref 11.5–15.5)
WBC: 3.6 10*3/uL — ABNORMAL LOW (ref 4.0–10.5)
nRBC: 7.8 % — ABNORMAL HIGH (ref 0.0–0.2)
nRBC: 9 /100 WBC — ABNORMAL HIGH

## 2020-06-04 LAB — BPAM RBC
Blood Product Expiration Date: 202110072359
Unit Type and Rh: 5100

## 2020-06-04 LAB — PREPARE RBC (CROSSMATCH)

## 2020-06-04 MED ORDER — HEPARIN SOD (PORK) LOCK FLUSH 100 UNIT/ML IV SOLN
250.0000 [IU] | Freq: Once | INTRAVENOUS | Status: AC | PRN
  Administered 2020-06-04: 250 [IU]
  Filled 2020-06-04: qty 5

## 2020-06-04 MED ORDER — SODIUM CHLORIDE 0.9% FLUSH
10.0000 mL | Freq: Once | INTRAVENOUS | Status: AC | PRN
  Administered 2020-06-04: 10 mL
  Filled 2020-06-04: qty 10

## 2020-06-04 NOTE — Progress Notes (Signed)
Patient here for oncology follow-up appointment, expresses no complaints or concerns at this time.    

## 2020-06-04 NOTE — Telephone Encounter (Signed)
Nutrition  Referral received from Billey Chang, NP for poor appetite.   Called patient for nutrition assessment.  No answer.  Left message with call back number.  Khylei Wilms B. Zenia Resides, [place], [place] Registered Dietitian [phone] (mobile)

## 2020-06-06 ENCOUNTER — Other Ambulatory Visit: Payer: Self-pay

## 2020-06-06 ENCOUNTER — Inpatient Hospital Stay: Payer: Medicare Other

## 2020-06-06 ENCOUNTER — Other Ambulatory Visit: Payer: Self-pay | Admitting: Hematology and Oncology

## 2020-06-06 DIAGNOSIS — C9200 Acute myeloblastic leukemia, not having achieved remission: Secondary | ICD-10-CM

## 2020-06-06 DIAGNOSIS — D649 Anemia, unspecified: Secondary | ICD-10-CM

## 2020-06-06 LAB — CBC WITH DIFFERENTIAL/PLATELET
Abs Immature Granulocytes: 0.2 10*3/uL — ABNORMAL HIGH (ref 0.00–0.07)
Band Neutrophils: 7 %
Basophils Absolute: 0 10*3/uL (ref 0.0–0.1)
Basophils Relative: 0 %
Eosinophils Absolute: 0 10*3/uL (ref 0.0–0.5)
Eosinophils Relative: 0 %
HCT: 24.8 % — ABNORMAL LOW (ref 39.0–52.0)
Hemoglobin: 7.8 g/dL — ABNORMAL LOW (ref 13.0–17.0)
Lymphocytes Relative: 16 %
Lymphs Abs: 0.7 10*3/uL (ref 0.7–4.0)
MCH: 27.8 pg (ref 26.0–34.0)
MCHC: 31.5 g/dL (ref 30.0–36.0)
MCV: 88.3 fL (ref 80.0–100.0)
Metamyelocytes Relative: 3 %
Monocytes Absolute: 0.6 10*3/uL (ref 0.1–1.0)
Monocytes Relative: 14 %
Myelocytes: 2 %
Neutro Abs: 2.7 10*3/uL (ref 1.7–7.7)
Neutrophils Relative %: 54 %
Other: 4 %
Platelets: 112 10*3/uL — ABNORMAL LOW (ref 150–400)
RBC: 2.81 MIL/uL — ABNORMAL LOW (ref 4.22–5.81)
RDW: 19.1 % — ABNORMAL HIGH (ref 11.5–15.5)
WBC: 4.4 10*3/uL (ref 4.0–10.5)
nRBC: 5 /100 WBC — ABNORMAL HIGH
nRBC: 7.5 % — ABNORMAL HIGH (ref 0.0–0.2)

## 2020-06-06 LAB — PREPARE RBC (CROSSMATCH)

## 2020-06-06 LAB — SAMPLE TO BLOOD BANK

## 2020-06-06 MED ORDER — DIPHENHYDRAMINE HCL 25 MG PO CAPS
25.0000 mg | ORAL_CAPSULE | Freq: Once | ORAL | Status: AC
  Administered 2020-06-06: 25 mg via ORAL
  Filled 2020-06-06: qty 1

## 2020-06-06 MED ORDER — SODIUM CHLORIDE 0.9% IV SOLUTION
250.0000 mL | Freq: Once | INTRAVENOUS | Status: AC
  Administered 2020-06-06: 250 mL via INTRAVENOUS
  Filled 2020-06-06: qty 250

## 2020-06-06 MED ORDER — HEPARIN SOD (PORK) LOCK FLUSH 100 UNIT/ML IV SOLN
500.0000 [IU] | Freq: Once | INTRAVENOUS | Status: AC
  Administered 2020-06-06: 500 [IU] via INTRAVENOUS
  Filled 2020-06-06: qty 5

## 2020-06-06 MED ORDER — ACETAMINOPHEN 325 MG PO TABS
650.0000 mg | ORAL_TABLET | Freq: Once | ORAL | Status: AC
  Administered 2020-06-06: 650 mg via ORAL
  Filled 2020-06-06: qty 2

## 2020-06-07 ENCOUNTER — Ambulatory Visit: Payer: Medicare Other

## 2020-06-07 ENCOUNTER — Inpatient Hospital Stay: Payer: Medicare Other

## 2020-06-07 ENCOUNTER — Other Ambulatory Visit: Payer: Medicare Other

## 2020-06-07 DIAGNOSIS — C9200 Acute myeloblastic leukemia, not having achieved remission: Secondary | ICD-10-CM

## 2020-06-07 DIAGNOSIS — D649 Anemia, unspecified: Secondary | ICD-10-CM

## 2020-06-07 MED ORDER — HEPARIN SOD (PORK) LOCK FLUSH 100 UNIT/ML IV SOLN
500.0000 [IU] | Freq: Once | INTRAVENOUS | Status: AC
  Administered 2020-06-07: 500 [IU] via INTRAVENOUS
  Filled 2020-06-07: qty 5

## 2020-06-07 MED ORDER — SODIUM CHLORIDE 0.9% IV SOLUTION
250.0000 mL | Freq: Once | INTRAVENOUS | Status: AC
  Administered 2020-06-07: 250 mL via INTRAVENOUS
  Filled 2020-06-07: qty 250

## 2020-06-07 MED ORDER — ACETAMINOPHEN 325 MG PO TABS
650.0000 mg | ORAL_TABLET | Freq: Once | ORAL | Status: AC
  Administered 2020-06-07: 650 mg via ORAL
  Filled 2020-06-07: qty 2

## 2020-06-07 MED ORDER — DIPHENHYDRAMINE HCL 25 MG PO CAPS
25.0000 mg | ORAL_CAPSULE | Freq: Once | ORAL | Status: AC
  Administered 2020-06-07: 25 mg via ORAL
  Filled 2020-06-07: qty 1

## 2020-06-08 ENCOUNTER — Inpatient Hospital Stay: Payer: Medicare Other

## 2020-06-08 ENCOUNTER — Ambulatory Visit
Admission: RE | Admit: 2020-06-08 | Discharge: 2020-06-08 | Disposition: A | Discharge status: Home / Self Care | Payer: Medicare Other | Source: Home / Self Care | Attending: Oncology | Admitting: Oncology

## 2020-06-08 ENCOUNTER — Inpatient Hospital Stay (HOSPITAL_BASED_OUTPATIENT_CLINIC_OR_DEPARTMENT_OTHER): Payer: Medicare Other | Admitting: Oncology

## 2020-06-08 ENCOUNTER — Encounter: Payer: Self-pay | Admitting: Oncology

## 2020-06-08 ENCOUNTER — Inpatient Hospital Stay
Admission: EM | Admit: 2020-06-08 | Discharge: 2020-06-16 | DRG: 834 | Disposition: A | Discharge status: Hospice | Payer: Medicare Other | Source: Ambulatory Visit | Attending: Internal Medicine | Admitting: Internal Medicine

## 2020-06-08 ENCOUNTER — Other Ambulatory Visit: Payer: Self-pay

## 2020-06-08 ENCOUNTER — Encounter: Payer: Self-pay | Admitting: Emergency Medicine

## 2020-06-08 ENCOUNTER — Inpatient Hospital Stay (HOSPITAL_BASED_OUTPATIENT_CLINIC_OR_DEPARTMENT_OTHER): Payer: Medicare Other | Admitting: Hospice and Palliative Medicine

## 2020-06-08 ENCOUNTER — Telehealth: Payer: Self-pay | Admitting: Hospice and Palliative Medicine

## 2020-06-08 ENCOUNTER — Ambulatory Visit
Admission: RE | Admit: 2020-06-08 | Discharge: 2020-06-08 | Disposition: A | Discharge status: Home / Self Care | Payer: Medicare Other | Source: Ambulatory Visit | Attending: Oncology | Admitting: Oncology

## 2020-06-08 ENCOUNTER — Other Ambulatory Visit: Payer: Self-pay | Admitting: Oncology

## 2020-06-08 ENCOUNTER — Telehealth: Payer: Self-pay | Admitting: *Deleted

## 2020-06-08 VITALS — BP 138/85 | HR 115 | Temp 100.9°F | Resp 18

## 2020-06-08 DIAGNOSIS — Z148 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: Secondary | ICD-10-CM | POA: Diagnosis not present

## 2020-06-08 DIAGNOSIS — R338 Other retention of urine: Secondary | ICD-10-CM | POA: Diagnosis present

## 2020-06-08 DIAGNOSIS — M25562 Pain in left knee: Secondary | ICD-10-CM | POA: Diagnosis present

## 2020-06-08 DIAGNOSIS — R799 Abnormal finding of blood chemistry, unspecified: Secondary | ICD-10-CM | POA: Diagnosis not present

## 2020-06-08 DIAGNOSIS — R059 Cough, unspecified: Secondary | ICD-10-CM

## 2020-06-08 DIAGNOSIS — Z87891 Personal history of nicotine dependence: Secondary | ICD-10-CM

## 2020-06-08 DIAGNOSIS — R0602 Shortness of breath: Secondary | ICD-10-CM

## 2020-06-08 DIAGNOSIS — R000 Tachycardia, unspecified: Secondary | ICD-10-CM | POA: Diagnosis present

## 2020-06-08 DIAGNOSIS — M25561 Pain in right knee: Secondary | ICD-10-CM | POA: Diagnosis present

## 2020-06-08 DIAGNOSIS — E8770 Fluid overload, unspecified: Secondary | ICD-10-CM | POA: Diagnosis present

## 2020-06-08 DIAGNOSIS — C9590 Leukemia, unspecified not having achieved remission: Secondary | ICD-10-CM

## 2020-06-08 DIAGNOSIS — Z9221 Personal history of antineoplastic chemotherapy: Secondary | ICD-10-CM

## 2020-06-08 DIAGNOSIS — C9200 Acute myeloblastic leukemia, not having achieved remission: Principal | ICD-10-CM | POA: Diagnosis present

## 2020-06-08 DIAGNOSIS — I517 Cardiomegaly: Secondary | ICD-10-CM | POA: Diagnosis not present

## 2020-06-08 DIAGNOSIS — R05 Cough: Secondary | ICD-10-CM | POA: Insufficient documentation

## 2020-06-08 DIAGNOSIS — J90 Pleural effusion, not elsewhere classified: Secondary | ICD-10-CM | POA: Diagnosis not present

## 2020-06-08 DIAGNOSIS — Z20822 Contact with and (suspected) exposure to covid-19: Secondary | ICD-10-CM | POA: Diagnosis present

## 2020-06-08 DIAGNOSIS — D45 Polycythemia vera: Secondary | ICD-10-CM | POA: Diagnosis present

## 2020-06-08 DIAGNOSIS — J189 Pneumonia, unspecified organism: Secondary | ICD-10-CM | POA: Diagnosis present

## 2020-06-08 DIAGNOSIS — A419 Sepsis, unspecified organism: Secondary | ICD-10-CM | POA: Diagnosis not present

## 2020-06-08 DIAGNOSIS — N183 Chronic kidney disease, stage 3 unspecified: Secondary | ICD-10-CM | POA: Diagnosis present

## 2020-06-08 DIAGNOSIS — R6510 Systemic inflammatory response syndrome (SIRS) of non-infectious origin without acute organ dysfunction: Secondary | ICD-10-CM | POA: Diagnosis present

## 2020-06-08 DIAGNOSIS — Z66 Do not resuscitate: Secondary | ICD-10-CM | POA: Diagnosis present

## 2020-06-08 DIAGNOSIS — D630 Anemia in neoplastic disease: Secondary | ICD-10-CM | POA: Diagnosis present

## 2020-06-08 DIAGNOSIS — D6481 Anemia due to antineoplastic chemotherapy: Secondary | ICD-10-CM

## 2020-06-08 DIAGNOSIS — Z6820 Body mass index (BMI) 20.0-20.9, adult: Secondary | ICD-10-CM

## 2020-06-08 DIAGNOSIS — R509 Fever, unspecified: Secondary | ICD-10-CM

## 2020-06-08 DIAGNOSIS — R531 Weakness: Secondary | ICD-10-CM | POA: Diagnosis not present

## 2020-06-08 DIAGNOSIS — Z8042 Family history of malignant neoplasm of prostate: Secondary | ICD-10-CM

## 2020-06-08 DIAGNOSIS — E876 Hypokalemia: Secondary | ICD-10-CM | POA: Diagnosis present

## 2020-06-08 DIAGNOSIS — J811 Chronic pulmonary edema: Secondary | ICD-10-CM | POA: Diagnosis not present

## 2020-06-08 DIAGNOSIS — G8929 Other chronic pain: Secondary | ICD-10-CM | POA: Diagnosis present

## 2020-06-08 DIAGNOSIS — D509 Iron deficiency anemia, unspecified: Secondary | ICD-10-CM | POA: Diagnosis present

## 2020-06-08 DIAGNOSIS — Z515 Encounter for palliative care: Secondary | ICD-10-CM

## 2020-06-08 DIAGNOSIS — R54 Age-related physical debility: Secondary | ICD-10-CM | POA: Diagnosis present

## 2020-06-08 DIAGNOSIS — N401 Enlarged prostate with lower urinary tract symptoms: Secondary | ICD-10-CM | POA: Diagnosis present

## 2020-06-08 DIAGNOSIS — I4510 Unspecified right bundle-branch block: Secondary | ICD-10-CM | POA: Diagnosis present

## 2020-06-08 DIAGNOSIS — T451X5A Adverse effect of antineoplastic and immunosuppressive drugs, initial encounter: Secondary | ICD-10-CM

## 2020-06-08 DIAGNOSIS — H409 Unspecified glaucoma: Secondary | ICD-10-CM | POA: Diagnosis present

## 2020-06-08 DIAGNOSIS — Z79899 Other long term (current) drug therapy: Secondary | ICD-10-CM

## 2020-06-08 DIAGNOSIS — D7581 Myelofibrosis: Secondary | ICD-10-CM | POA: Diagnosis present

## 2020-06-08 DIAGNOSIS — Z807 Family history of other malignant neoplasms of lymphoid, hematopoietic and related tissues: Secondary | ICD-10-CM

## 2020-06-08 DIAGNOSIS — Z960 Presence of urogenital implants: Secondary | ICD-10-CM | POA: Diagnosis present

## 2020-06-08 DIAGNOSIS — N179 Acute kidney failure, unspecified: Secondary | ICD-10-CM | POA: Diagnosis not present

## 2020-06-08 DIAGNOSIS — M109 Gout, unspecified: Secondary | ICD-10-CM | POA: Diagnosis present

## 2020-06-08 DIAGNOSIS — R0682 Tachypnea, not elsewhere classified: Secondary | ICD-10-CM

## 2020-06-08 DIAGNOSIS — R52 Pain, unspecified: Secondary | ICD-10-CM

## 2020-06-08 DIAGNOSIS — E871 Hypo-osmolality and hyponatremia: Secondary | ICD-10-CM | POA: Diagnosis present

## 2020-06-08 DIAGNOSIS — J9601 Acute respiratory failure with hypoxia: Secondary | ICD-10-CM | POA: Diagnosis not present

## 2020-06-08 DIAGNOSIS — R5081 Fever presenting with conditions classified elsewhere: Secondary | ICD-10-CM | POA: Diagnosis present

## 2020-06-08 DIAGNOSIS — N1832 Chronic kidney disease, stage 3b: Secondary | ICD-10-CM | POA: Diagnosis present

## 2020-06-08 DIAGNOSIS — K5090 Crohn's disease, unspecified, without complications: Secondary | ICD-10-CM | POA: Diagnosis present

## 2020-06-08 DIAGNOSIS — Z800 Family history of malignant neoplasm of digestive organs: Secondary | ICD-10-CM

## 2020-06-08 DIAGNOSIS — E43 Unspecified severe protein-calorie malnutrition: Secondary | ICD-10-CM | POA: Insufficient documentation

## 2020-06-08 DIAGNOSIS — D849 Immunodeficiency, unspecified: Secondary | ICD-10-CM | POA: Diagnosis present

## 2020-06-08 DIAGNOSIS — Z803 Family history of malignant neoplasm of breast: Secondary | ICD-10-CM

## 2020-06-08 DIAGNOSIS — D649 Anemia, unspecified: Secondary | ICD-10-CM

## 2020-06-08 DIAGNOSIS — J9811 Atelectasis: Secondary | ICD-10-CM | POA: Diagnosis not present

## 2020-06-08 DIAGNOSIS — N400 Enlarged prostate without lower urinary tract symptoms: Secondary | ICD-10-CM | POA: Diagnosis present

## 2020-06-08 LAB — URINALYSIS, COMPLETE (UACMP) WITH MICROSCOPIC
Bilirubin Urine: NEGATIVE
Glucose, UA: NEGATIVE mg/dL
Hgb urine dipstick: NEGATIVE
Ketones, ur: NEGATIVE mg/dL
Leukocytes,Ua: NEGATIVE
Nitrite: NEGATIVE
Protein, ur: 100 mg/dL — AB
Specific Gravity, Urine: 1.013 (ref 1.005–1.030)
Squamous Epithelial / HPF: NONE SEEN (ref 0–5)
pH: 6 (ref 5.0–8.0)

## 2020-06-08 LAB — CBC WITH DIFFERENTIAL/PLATELET
Abs Immature Granulocytes: 0.7 10*3/uL — ABNORMAL HIGH (ref 0.00–0.07)
Band Neutrophils: 9 %
Basophils Absolute: 0 10*3/uL (ref 0.0–0.1)
Basophils Relative: 0 %
Eosinophils Absolute: 0 10*3/uL (ref 0.0–0.5)
Eosinophils Relative: 0 %
HCT: 30 % — ABNORMAL LOW (ref 39.0–52.0)
Hemoglobin: 9.9 g/dL — ABNORMAL LOW (ref 13.0–17.0)
Lymphocytes Relative: 20 %
Lymphs Abs: 1.3 10*3/uL (ref 0.7–4.0)
MCH: 28.3 pg (ref 26.0–34.0)
MCHC: 33 g/dL (ref 30.0–36.0)
MCV: 85.7 fL (ref 80.0–100.0)
Metamyelocytes Relative: 5 %
Monocytes Absolute: 0.9 10*3/uL (ref 0.1–1.0)
Monocytes Relative: 13 %
Myelocytes: 6 %
Neutro Abs: 3.8 10*3/uL (ref 1.7–7.7)
Neutrophils Relative %: 47 %
Platelets: 153 10*3/uL (ref 150–400)
RBC: 3.5 MIL/uL — ABNORMAL LOW (ref 4.22–5.81)
RDW: 18.7 % — ABNORMAL HIGH (ref 11.5–15.5)
Smear Review: ADEQUATE
WBC: 6.7 10*3/uL (ref 4.0–10.5)
nRBC: 5.2 % — ABNORMAL HIGH (ref 0.0–0.2)

## 2020-06-08 LAB — TYPE AND SCREEN
ABO/RH(D): O POS
ABO/RH(D): O POS
Antibody Screen: NEGATIVE
Antibody Screen: NEGATIVE
Unit division: 0
Unit division: 0

## 2020-06-08 LAB — BASIC METABOLIC PANEL
Anion gap: 8 (ref 5–15)
Anion gap: 8 (ref 5–15)
BUN: 21 mg/dL (ref 8–23)
BUN: 25 mg/dL — ABNORMAL HIGH (ref 8–23)
CO2: 24 mmol/L (ref 22–32)
CO2: 23 mmol/L (ref 22–32)
Calcium: 8.5 mg/dL — ABNORMAL LOW (ref 8.9–10.3)
Calcium: 8.4 mg/dL — ABNORMAL LOW (ref 8.9–10.3)
Chloride: 99 mmol/L (ref 98–111)
Chloride: 98 mmol/L (ref 98–111)
Creatinine, Ser: 1.37 mg/dL — ABNORMAL HIGH (ref 0.61–1.24)
Creatinine, Ser: 1.41 mg/dL — ABNORMAL HIGH (ref 0.61–1.24)
GFR calc Af Amer: 59 mL/min — ABNORMAL LOW (ref >60)
GFR calc Af Amer: 57 mL/min — ABNORMAL LOW (ref >60)
GFR calc non Af Amer: 51 mL/min — ABNORMAL LOW (ref >60)
GFR calc non Af Amer: 49 mL/min — ABNORMAL LOW (ref >60)
Glucose, Bld: 90 mg/dL (ref 70–99)
Glucose, Bld: 111 mg/dL — ABNORMAL HIGH (ref 70–99)
Potassium: 3.7 mmol/L (ref 3.5–5.1)
Potassium: 4.1 mmol/L (ref 3.5–5.1)
Sodium: 131 mmol/L — ABNORMAL LOW (ref 135–145)
Sodium: 129 mmol/L — ABNORMAL LOW (ref 135–145)

## 2020-06-08 LAB — LACTIC ACID, PLASMA: Lactic Acid, Venous: 2.2 mmol/L (ref 0.5–1.9)

## 2020-06-08 LAB — BPAM RBC
Blood Product Expiration Date: 202109202359
Blood Product Expiration Date: 202109232359
ISSUE DATE / TIME: 202109151034
ISSUE DATE / TIME: 202109160915
Unit Type and Rh: 5100
Unit Type and Rh: 9500

## 2020-06-08 LAB — PROCALCITONIN: Procalcitonin: 5.82 ng/mL

## 2020-06-08 LAB — SARS CORONAVIRUS 2 BY RT PCR (HOSPITAL ORDER, PERFORMED IN ~~LOC~~ HOSPITAL LAB): SARS Coronavirus 2: NEGATIVE

## 2020-06-08 MED ORDER — SODIUM CHLORIDE 0.9 % IV SOLN
Freq: Once | INTRAVENOUS | Status: AC
  Filled 2020-06-08: qty 250

## 2020-06-08 MED ORDER — SODIUM CHLORIDE 0.9% FLUSH
10.0000 mL | Freq: Once | INTRAVENOUS | Status: AC | PRN
  Administered 2020-06-08: 10 mL
  Filled 2020-06-08: qty 10

## 2020-06-08 MED ORDER — SODIUM CHLORIDE 0.9 % IV BOLUS
1000.0000 mL | Freq: Once | INTRAVENOUS | Status: AC
  Administered 2020-06-08: 1000 mL via INTRAVENOUS

## 2020-06-08 MED ORDER — VANCOMYCIN HCL 1750 MG/350ML IV SOLN
1750.0000 mg | Freq: Once | INTRAVENOUS | Status: AC
  Administered 2020-06-08: 1750 mg via INTRAVENOUS
  Filled 2020-06-08 (×2): qty 350

## 2020-06-08 MED ORDER — BENZONATATE 100 MG PO CAPS: 100.0000 mg | ORAL_CAPSULE | Freq: Two times a day (BID) | ORAL | 0 refills | Status: DC | PRN

## 2020-06-08 MED ORDER — ALLOPURINOL 100 MG PO TABS
150.0000 mg | ORAL_TABLET | Freq: Every day | ORAL | Status: DC
  Administered 2020-06-09 – 2020-06-14 (×6): 150 mg via ORAL
  Filled 2020-06-08: qty 0.5
  Filled 2020-06-08 (×3): qty 2
  Filled 2020-06-08: qty 0.5
  Filled 2020-06-08: qty 2

## 2020-06-08 MED ORDER — VITAMIN B-12 1000 MCG PO TABS
1000.0000 ug | ORAL_TABLET | Freq: Every day | ORAL | Status: DC
  Administered 2020-06-09 – 2020-06-14 (×6): 1000 ug via ORAL
  Filled 2020-06-08 (×6): qty 1

## 2020-06-08 MED ORDER — LACTATED RINGERS IV SOLN: INTRAVENOUS | Status: AC

## 2020-06-08 MED ORDER — HEPARIN SODIUM (PORCINE) 5000 UNIT/ML IJ SOLN
5000.0000 [IU] | Freq: Three times a day (TID) | INTRAMUSCULAR | Status: DC
  Administered 2020-06-08 – 2020-06-09 (×2): 5000 [IU] via SUBCUTANEOUS
  Filled 2020-06-08 (×2): qty 1

## 2020-06-08 MED ORDER — HEPARIN SOD (PORK) LOCK FLUSH 100 UNIT/ML IV SOLN
INTRAVENOUS | Status: AC
  Filled 2020-06-08: qty 5

## 2020-06-08 MED ORDER — ONDANSETRON HCL 4 MG/2ML IJ SOLN: 4.0000 mg | Freq: Four times a day (QID) | INTRAMUSCULAR | Status: DC | PRN

## 2020-06-08 MED ORDER — SODIUM CHLORIDE 0.9 % IV SOLN
2.0000 g | Freq: Once | INTRAVENOUS | Status: AC
  Administered 2020-06-08: 2 g via INTRAVENOUS
  Filled 2020-06-08: qty 2

## 2020-06-08 MED ORDER — METRONIDAZOLE IN NACL 5-0.79 MG/ML-% IV SOLN
500.0000 mg | Freq: Three times a day (TID) | INTRAVENOUS | Status: DC
  Administered 2020-06-08 – 2020-06-12 (×10): 500 mg via INTRAVENOUS
  Filled 2020-06-08 (×12): qty 100

## 2020-06-08 MED ORDER — ONDANSETRON HCL 4 MG PO TABS: 4.0000 mg | ORAL_TABLET | Freq: Four times a day (QID) | ORAL | Status: DC | PRN

## 2020-06-08 MED ORDER — BENZONATATE 100 MG PO CAPS: 100.0000 mg | ORAL_CAPSULE | Freq: Two times a day (BID) | ORAL | Status: DC | PRN

## 2020-06-08 MED ORDER — SODIUM CHLORIDE 0.9 % IV SOLN
2.0000 g | Freq: Two times a day (BID) | INTRAVENOUS | Status: AC
  Administered 2020-06-09 – 2020-06-14 (×10): 2 g via INTRAVENOUS
  Filled 2020-06-08 (×14): qty 2

## 2020-06-08 MED ORDER — FOLIC ACID 1 MG PO TABS
1.0000 mg | ORAL_TABLET | Freq: Every day | ORAL | Status: DC
  Administered 2020-06-10 – 2020-06-14 (×5): 1 mg via ORAL
  Filled 2020-06-08 (×6): qty 1

## 2020-06-08 MED ORDER — SODIUM CHLORIDE 0.9% FLUSH
10.0000 mL | Freq: Once | INTRAVENOUS | Status: AC
  Administered 2020-06-08: 10 mL via INTRAVENOUS
  Filled 2020-06-08: qty 10

## 2020-06-08 MED ORDER — HEPARIN SOD (PORK) LOCK FLUSH 100 UNIT/ML IV SOLN
500.0000 [IU] | Freq: Once | INTRAVENOUS | Status: AC | PRN
  Administered 2020-06-08: 500 [IU]
  Filled 2020-06-08: qty 5

## 2020-06-08 MED ORDER — ACETAMINOPHEN 325 MG PO TABS
650.0000 mg | ORAL_TABLET | Freq: Four times a day (QID) | ORAL | Status: DC | PRN
  Administered 2020-06-09 – 2020-06-15 (×5): 650 mg via ORAL
  Filled 2020-06-08 (×5): qty 2

## 2020-06-08 MED ORDER — ACETAMINOPHEN 650 MG RE SUPP: 650.0000 mg | Freq: Four times a day (QID) | RECTAL | Status: DC | PRN

## 2020-06-08 MED ORDER — LATANOPROST 0.005 % OP SOLN
1.0000 [drp] | Freq: Every day | OPHTHALMIC | Status: DC
  Administered 2020-06-09 – 2020-06-15 (×7): 1 [drp] via OPHTHALMIC
  Filled 2020-06-08 (×2): qty 2.5

## 2020-06-08 MED ORDER — TIMOLOL MALEATE 0.5 % OP SOLN
1.0000 [drp] | Freq: Every day | OPHTHALMIC | Status: DC
  Administered 2020-06-09 – 2020-06-16 (×8): 1 [drp] via OPHTHALMIC
  Filled 2020-06-08: qty 5

## 2020-06-08 MED ORDER — VANCOMYCIN HCL 750 MG/150ML IV SOLN
750.0000 mg | Freq: Two times a day (BID) | INTRAVENOUS | Status: DC
  Administered 2020-06-09: 750 mg via INTRAVENOUS
  Filled 2020-06-08 (×3): qty 150

## 2020-06-08 MED ORDER — HEPARIN SOD (PORK) LOCK FLUSH 100 UNIT/ML IV SOLN
500.0000 [IU] | Freq: Once | INTRAVENOUS | Status: AC
  Administered 2020-06-08: 500 [IU] via INTRAVENOUS
  Filled 2020-06-08: qty 5

## 2020-06-08 MED ORDER — VALACYCLOVIR HCL 500 MG PO TABS
500.0000 mg | ORAL_TABLET | Freq: Every day | ORAL | Status: DC
  Administered 2020-06-09 – 2020-06-10 (×2): 500 mg via ORAL
  Filled 2020-06-08 (×3): qty 1

## 2020-06-08 NOTE — H&P (Signed)
History and Physical    Ruben [name] WYO:[id number] DOB: [birth year] DOA: 06/08/2020  PCP: [name], MD  Patient coming from: Oncology office  I have personally briefly reviewed patient's old medical records in [place]  Chief Complaint: Generalized weakness, subjective fevers  HPI: Ruben [name] is a 74 y.o. 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  Patient states he went to his oncology office for routine lab work to determine if he needed further blood transfusions.  Lab work was reassuring however after his office visit he became generally weak, fatigued, appeared pale, and felt confused.  He has had a cough which has been nonproductive and ongoing for some time.  He reported subjective fevers without chills or diaphoresis.  He denies any nausea, vomiting, abdominal pain.  He reports intermittent loose stools related to his chronic Crohn's disease which is not significantly changed.  He reports decreased p.o. intake recently.  He has swelling in both of his legs which he thinks has decreased recently.  ED Course:  Initial vitals showed BP 138/85, pulse 115, RR 18, temp 100.9 Fahrenheit, SPO2 96% on room air.  Labs notable for WBC 6.7, hemoglobin 9.9, platelets 153,000, sodium 129, potassium 4.1, bicarb 23, BUN 25, creatinine 1.41 glucose 121, lactic acid 2.2, procalcitonin 5.82.  Urinalysis showed negative nitrates, negative leukocytes, 0-5 RBC/hpf, 0 WBC/hpf, rare bacteria microscopy.  Blood and urine cultures were obtained and pending.  SARS-CoV-2 PCR is negative.  TB chest x-ray shows right IJ central  line in good position with improved aeration compared to prior x-ray from 04/26/2020.  Stable cardiomegaly and pulmonary vascular congestion are seen as well as small left pleural effusion.  Patient was given 1 L normal saline and started on empiric IV vancomycin and cefepime.  Hospitalist service was consulted admit for further evaluation and management.  Review of Systems: All systems reviewed and are negative except as documented in history of present illness above.   Past Medical History:  Diagnosis Date  . Blood dyscrasia   . BPH (benign prostatic hyperplasia)   . Cancer ([place])    SKIN/ POLYCYTHEMIA VERA  . Chronic kidney disease    RENAL INSUFF (40%)  . Crohn disease ([place])   . Crohn's disease ([place])   . Glaucoma   . Glaucoma   . Gout   . History of chicken pox   . Myelofibrosis ([place])   . Nosebleed   . RBBB   . Right bundle branch block     Past Surgical History:  Procedure Laterality Date  . CATARACT EXTRACTION W/PHACO Left 07/13/2018   Procedure: CATARACT EXTRACTION PHACO AND INTRAOCULAR LENS PLACEMENT (IOC);  Surgeon: [name], MD;  Location: [facility] ORS;  Service: Ophthalmology;  Laterality: Left;  [place] 00:39  CDE 5.14 Fluid pack lot # [id number]  . CATARACT EXTRACTION W/PHACO Right 08/03/2018   Procedure: CATARACT EXTRACTION PHACO AND INTRAOCULAR LENS PLACEMENT (IOC);  Surgeon: [name], MD;  Location: [facility] ORS;  Service: Ophthalmology;  Laterality: Right;  [place] 00:43.4 CDE 5.23 Fluid pack Lot # [id number]  . COLONOSCOPY WITH PROPOFOL N/A 03/27/2017   Procedure: COLONOSCOPY WITH PROPOFOL;  Surgeon: [name], MD;  Location[facility] ENDOSCOPY;  Service: Endoscopy;  Laterality: N/A;  . IR FLUORO GUIDE CV LINE LEFT  05/18/2020  . MOHS SURGERY     EAR  . PROSTATE SURGERY  2008   Prostate Biopsy in [place] to elevated PSA.  per patient normal  . Skin Lesion Basal cell removed    . wart removal     from eyelid    Social History:  reports that he quit  smoking about 16 years ago. His smoking use included cigarettes. He has a 5.00 pack-year smoking history. He has never used smokeless tobacco. He reports previous alcohol use of about 4.0 - 5.0 standard drinks of alcohol per week. He reports that he does not use drugs.  No Known Allergies  Family History  Problem Relation Age of Onset  . Cancer Mother        Liver  . Cancer Father        Prostate  . Cancer Sister        Hodgkins lymphoma  . Cancer Paternal Aunt        Breast     Prior to Admission medications   Medication Sig Start Date End Date Taking? Authorizing Provider  acetaminophen (TYLENOL) 500 MG tablet Take 500 mg by mouth daily as needed for moderate pain. Patient not taking: Reported on 06/04/2020    [provider]  allopurinol (ZYLOPRIM) 300 MG tablet TAKE 1/2 TABLET (150 MG TOTAL) BY MOUTH DAILY. 12/05/19   Lequita Asal, MD  benzonatate (TESSALON) 100 MG capsule Take 1 capsule (100 mg total) by mouth 2 (two) times daily as needed for cough. 06/08/20   Borders, Kirt Boys, NP  Cyanocobalamin (RA VITAMIN B-12 TR) 1000 MCG TBCR Take 1,000 mcg by mouth daily at 12 noon.     [provider]  cyanocobalamin 1000 MCG tablet Take by mouth.     [provider]  finasteride (PROSCAR) 5 MG tablet Take 1 tablet (5 mg total) by mouth every evening. Patient not taking: Reported on 06/04/2020 11/08/19   Hollice Espy, MD  folic acid (FOLVITE) 1 MG tablet Take 1 mg by mouth daily at 12 noon.  03/20/15   [provider]  latanoprost (XALATAN) 0.005 % ophthalmic solution Place 1 drop into both eyes at bedtime.     [provider]  levofloxacin (LEVAQUIN) 250 MG tablet Take 250 mg by mouth daily.     [provider]  midodrine (PROAMATINE) 5 MG tablet Take 1 tablet (5 mg total) by mouth 3 (three) times daily with meals. 04/30/20   Jennye Boroughs, MD  ondansetron (ZOFRAN-ODT) 4 MG disintegrating tablet Take 1 tablet (4 mg total) by mouth  every 8 (eight) hours as needed for nausea or vomiting. 06/01/20   Borders, Kirt Boys, NP  POSACONAZOLE PO Take 300 mg by mouth daily. Patient not taking: Reported on 06/04/2020    [provider]  potassium chloride SA (KLOR-CON M20) 20 MEQ tablet Take 1 tablet (20 mEq total) by mouth every other day. Patient taking every other day Patient not taking: Reported on 06/04/2020 06/02/20   Lequita Asal, MD  sodium bicarbonate 650 MG tablet Take 650 mg by mouth 2 (two) times daily. 06/06/17   [provider]  timolol (TIMOPTIC) 0.5 % ophthalmic solution Place 1 drop into both eyes daily. In AM 05/27/18   [provider]  valACYclovir (VALTREX) 500 MG tablet Take 500 mg by mouth daily.    [provider]  VENCLEXTA 100 MG TABS  05/24/20   [provider]    Physical Exam: Vitals:   06/08/20 1658 06/08/20 1703 06/08/20 1900  BP: (!) 145/74  131/75  Pulse: (!) 117  (!) 105  Resp: (!) 21  (!) 32  Temp: 98.8 F (37.1 C)    TempSrc: Oral    SpO2: 94%  93%  Weight:  70.8 kg   Height:  6' (1.829 m)    Constitutional: Resting supine in bed, NAD, calm, appears tired but comfortable Eyes: PERRL, lids and conjunctivae normal ENMT: Mucous membranes are moist. Posterior pharynx clear of any exudate or lesions.Normal dentition.  Neck: normal, supple, no masses. Respiratory: clear to auscultation bilaterally, no wheezing, no crackles. Normal respiratory effort. No accessory muscle use.  Cardiovascular: Right subclavian central line in place.  Regular rate and rhythm, systolic murmur present.  +1 bilateral lower extremity edema. 2+ pedal pulses. Abdomen: no tenderness, no masses palpated. No hepatosplenomegaly. Bowel sounds positive.  Musculoskeletal: no clubbing / cyanosis. No joint deformity upper and lower extremities. Good ROM, no contractures. Normal muscle tone.  Skin: no rashes, lesions, ulcers. No induration Neurologic: CN 2-12 grossly intact. Sensation  intact, Strength 5/5 in all 4.  Psychiatric: Normal judgment and insight. Alert and oriented x 3. Normal mood.   Labs on Admission: I have personally reviewed following labs and imaging studies  CBC: Recent Labs  Lab 06/04/20 1006 06/06/20 0821 06/08/20 0824  WBC 3.6* 4.4 6.7  NEUTROABS 2.3 2.7 3.8  HGB 8.5* 7.8* 9.9*  HCT 26.2* 24.8* 30.0*  MCV 88.2 88.3 85.7  PLT 80* 112* 583   Basic Metabolic Panel: Recent Labs  Lab 06/04/20 1006 06/08/20 0824 06/08/20 1456  NA 134* 131* 129*  K 3.5 3.7 4.1  CL 102 99 98  CO2 26 24 23   GLUCOSE 86 90 111*  BUN 20 21 25*  CREATININE 1.35* 1.37* 1.41*  CALCIUM 8.3* 8.5* 8.4*   GFR: Estimated Creatinine Clearance: 46.7 mL/min (A) (by C-G formula based on SCr of 1.41 mg/dL (H)). Liver Function Tests: Recent Labs  Lab 06/04/20 1006  AST 27  ALT 11  ALKPHOS 66  BILITOT 1.0  PROT 9.1*  ALBUMIN 2.6*   No results for input(s): LIPASE, AMYLASE in the last 168 hours. No results for input(s): AMMONIA in the last 168 hours. Coagulation Profile: No results for input(s): INR, PROTIME in the last 168 hours. Cardiac Enzymes: No results for input(s): CKTOTAL, CKMB, CKMBINDEX, TROPONINI in the last 168 hours. BNP (last 3 results) No results for input(s): PROBNP in the last 8760 hours. HbA1C: No results for input(s): HGBA1C in the last 72 hours. CBG: No results for input(s): GLUCAP in the last 168 hours. Lipid Profile: No results for input(s): CHOL, HDL, LDLCALC, TRIG, CHOLHDL, LDLDIRECT in the last 72 hours. Thyroid Function Tests: No results for input(s): TSH, T4TOTAL, FREET4, T3FREE, THYROIDAB in the last 72 hours. Anemia Panel: No results for input(s): VITAMINB12, FOLATE, FERRITIN, TIBC, IRON, RETICCTPCT in the last 72 hours. Urine analysis:    Component Value Date/Time   COLORURINE YELLOW (A) 06/08/2020 1723   APPEARANCEUR CLEAR (A) 06/08/2020 1723   APPEARANCEUR Hazy (A) 11/08/2019 0947   LABSPEC 1.013 06/08/2020 1723    LABSPEC 1.021 08/24/2012 1636   PHURINE 6.0 06/08/2020 1723   GLUCOSEU NEGATIVE 06/08/2020 1723   GLUCOSEU Negative 08/24/2012 1636   HGBUR NEGATIVE 06/08/2020 1723   BILIRUBINUR NEGATIVE 06/08/2020 1723   BILIRUBINUR Negative 11/08/2019 0947   BILIRUBINUR Negative 08/24/2012 1636   KETONESUR NEGATIVE 06/08/2020 1723   PROTEINUR  100 (A) 06/08/2020 1723   UROBILINOGEN negative 09/03/2015 1124   NITRITE NEGATIVE 06/08/2020 1723   LEUKOCYTESUR NEGATIVE 06/08/2020 1723   LEUKOCYTESUR Negative 08/24/2012 1636    Radiological Exams on Admission: DG Chest 2 View  Result Date: 06/08/2020 CLINICAL DATA:  Chronic cough, shortness of breath EXAM: CHEST - 2 VIEW COMPARISON:  Prior chest x-ray 04/26/2020 FINDINGS: Tunneled right IJ central venous catheter present. The tip overlies the superior cavoatrial junction. Stable mild cardiomegaly. Mild pulmonary vascular congestion without overt edema. Significantly improved aeration compared to the prior radiograph. Small layering left pleural effusion with associated atelectasis. Stable chronic bronchitic changes and areas of linear scarring. No pneumothorax. No focal airspace infiltrate. IMPRESSION: 1. Overall, significantly improved aeration compared to prior imaging from 04/26/2020. 2. Stable cardiomegaly and pulmonary vascular congestion without overt edema. 3. Small left pleural effusion with associated atelectasis. 4. Tunneled right IJ central line in good position. Electronically Signed   By: Jacqulynn Cadet M.D.   On: 06/08/2020 14:04    EKG: Independently reviewed. Sinus tachycardia, rate 117, RBBB.  Rate is slower when compared to previous.  Assessment/Plan Principal Problem:   SIRS (systemic inflammatory response syndrome) (HCC) Active Problems:   BPH (benign prostatic hyperplasia)   Crohn disease (HCC)   Iron deficiency anemia   CKD (chronic kidney disease) stage 3, GFR 30-59 ml/min   Acute myeloid leukemia not having achieved remission  ([place])  Ruben [name] is a 74 y.o. 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  SIRS/febrile illness: Patient presenting with fever 100.9, tachycardia, tachypnea, lactic acid 2.2 without obvious infectious source.  CXR shows improved aeration compared to previous.  Urinalysis is not convincing for UTI.  SARS-CoV-2 PCR is negative.  He has been started on empiric antibiotics.  He has a central line in place right chest.  May also be fever of malignancy. 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 Mike Gip.  Undergoing chemotherapy with azacitidine and venetoclax. -Hold venetoclax for now  Anemia of chronic disease and iron deficiency: Chronic and stable.  Has been requiring frequent transfusions as an outpatient.  Continue to monitor.  CKD stage IIIb: Chronic appears stable.  Continue monitor.  Crohn's disease: Has chronic intermittent diarrhea.  Currently stable.  BPH with chronic urinary retention: Patient self catheterizes.  Generalized weakness: Request PT/OT eval.  DVT prophylaxis: Subcutaneous heparin Code Status: DNR, confirmed with patient on admission Family Communication: Discussed with patient, he has discussed with family Disposition Plan: From home, dispo pending further infectious work-up and PT/OT eval Consults called: None Admission status:  Status is: Observation  The patient remains  OBS appropriate and will d/c before 2 midnights.  Dispo: The patient is from: Home              Anticipated d/c is to: Home              Anticipated d/c date is: 3 days              Patient currently is not medically stable to d/c.  Zada Finders MD [facility]  If 7PM-7AM, please contact night-coverage [email or web address]  06/08/2020, 8:27 PM

## 2020-06-08 NOTE — Telephone Encounter (Signed)
I received a call from patient's wife.  After leaving the clinic today, she says patient developed a persistent cough, worse from baseline.  She also feels like patient now has a fever.  She checked his temp and it was 99.5.  I called and spoke with Dr. Mike Gip who requested patient be evaluated in [facility] [address] with plan to check a chest x-ray.  This information was communicated to Rulon Abide, NP who will see him later this afternoon.

## 2020-06-08 NOTE — ED Provider Notes (Signed)
Ruben [name] ____________________________________________   First MD Initiated Contact with Patient 06/08/20 1848     (approximate)  I have reviewed the triage vital signs and the nursing notes.  HISTORY  Chief Complaint sepsis   HPI Ruben [name] is a 74 y.o. malewho presents to the ED for evaluation of abnormal outpatient labs.   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 Otherwise history of Crohn's disease, BPH, CKD. Patient performs self urinary catheterizations 4 times daily at baseline.    Patient seen by oncology this morning and outpatient labs drawn demonstrate CKD at baseline, elevated procalcitonin to 5.8, lactic acid of 2.2.  Blood cultures were drawn and patient was transferred to the ED for further evaluation.  Patient and wife reports that patient felt well this morning and first went to the oncologist for routine blood work for assessment of need for transfusions, and reassured by CBC without need for transfusions.  After leaving oncologist, patient's wife went to the grocery store to get some things and patient stayed in the car, patient reportedly had multiple coughing fits and wife return to the car to find him short of breath and confused.  She called the cancer center and return to the cancer center this afternoon and where CXR was taken without infiltrates, and the additional blood work a procalcitonin, lactic acid and blood cultures were drawn.  Patient reports he feels better now after getting IV fluids and denies acute pain anywhere.  Reports chronic bilateral knee pain at baseline.    Past Medical History:  Diagnosis Date  . Blood dyscrasia   . BPH (benign prostatic hyperplasia)   . Cancer ([place])    SKIN/ POLYCYTHEMIA VERA  . Chronic kidney disease    RENAL INSUFF (40%)  . Crohn disease ([place])   . Crohn's disease  ([place])   . Glaucoma   . Glaucoma   . Gout   . History of chicken pox   . Myelofibrosis ([place])   . Nosebleed   . RBBB   . Right bundle branch block     Patient Active Problem List   Diagnosis Date Noted  . Thrombocytopenia ([place]) 06/02/2020  . Elevated bilirubin 05/28/2020  . Encounter for antineoplastic chemotherapy 05/21/2020  . Hypokalemia 05/12/2020  . Anemia due to antineoplastic chemotherapy 05/02/2020  . Chemotherapy-induced neutropenia ([place]) 05/02/2020  . Sepsis due to pneumonia ([place]) 04/25/2020  . Febrile neutropenia ([place]) 04/25/2020  . Leukopenia due to antineoplastic chemotherapy ([place]) 04/25/2020  . Epistaxis, recurrent 04/01/2020  . 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 Symptomatic anemia 03/02/2020  . Weight loss 03/02/2020  . Pneumonia of both lungs due to infectious organism 03/01/2020  . Elevated total protein 12/04/2019  . Secondary hyperparathyroidism of renal origin ([place]) 09/20/2019  . CKD (chronic kidney disease) stage 3, GFR 30-59 ml/min 09/20/2019  . Foot drop, right 06/09/2019  . Crohn's disease of small intestine without complication ([place]) 33/29/5188  . Right-sided thoracic back pain 04/06/2019  . Hyponatremia 04/06/2019  . Iron deficiency anemia 11/26/2018  . Goals of care, counseling/discussion 01/06/2018  . Myeloproliferative neoplasm ([place]) 01/04/2018  . High triglycerides 10/14/2017  . 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 Renal insufficiency 07/23/2016  . 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 Right ankle pain 10/31/2015  . Hyperuricemia 09/05/2015  . BPH (benign prostatic hyperplasia) 08/23/2015  . History of tobacco use 08/23/2015  .  Glaucoma 08/23/2015  . Enlarged prostate without lower urinary tract symptoms (luts) 08/23/2015  . Elevated prostate specific antigen (PSA) 08/23/2015  . Dermatitis 08/23/2015  . Crohn disease ([place]) 08/23/2015  . Other prurigo 08/23/2015  . Dorsalgia  08/23/2015  . Personal history of nicotine dependence 08/23/2015  . Polycythemia vera ([place]) 04/04/2015  . Secondary polycythemia 03/28/2015  . 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 Right bundle-branch block 05/03/2012    Past Surgical History:  Procedure Laterality Date  . CATARACT EXTRACTION W/PHACO Left 07/13/2018   Procedure: CATARACT EXTRACTION PHACO AND INTRAOCULAR LENS PLACEMENT (IOC);  Surgeon: [name], MD;  Location: [facility] ORS;  Service: Ophthalmology;  Laterality: Left;  [place] 00:39  CDE 5.14 Fluid pack lot # [id number]  . CATARACT EXTRACTION W/PHACO Right 08/03/2018   Procedure: CATARACT EXTRACTION PHACO AND INTRAOCULAR LENS PLACEMENT (IOC);  Surgeon: [name], MD;  Location: [facility] ORS;  Service: Ophthalmology;  Laterality: Right;  [place] 00:43.4 CDE 5.23 Fluid pack Lot # [id number]  . COLONOSCOPY WITH PROPOFOL N/A 03/27/2017   Procedure: COLONOSCOPY WITH PROPOFOL;  Surgeon: [name], MD;  Location: [facility] ENDOSCOPY;  Service: Endoscopy;  Laterality: N/A;  . IR FLUORO GUIDE CV LINE LEFT  05/18/2020  . MOHS SURGERY     EAR  . PROSTATE SURGERY  2008   Prostate Biopsy in [place] to elevated PSA.  per patient normal  . Skin Lesion Basal cell removed    . wart removal     from eyelid    Prior to Admission medications   Medication Sig Start Date End Date Taking? Authorizing Provider  acetaminophen (TYLENOL) 500 MG tablet Take 500 mg by mouth daily as needed for moderate pain. Patient not taking: Reported on 06/04/2020    [provider]  allopurinol (ZYLOPRIM) 300 MG tablet TAKE 1/2 TABLET (150 MG TOTAL) BY MOUTH DAILY. 12/05/19   Lequita Asal, MD  benzonatate (TESSALON) 100 MG capsule Take 1 capsule (100 mg total) by mouth 2 (two) times daily as needed for cough. 06/08/20   Borders, Kirt Boys, NP  Cyanocobalamin (RA VITAMIN B-12 TR) 1000 MCG TBCR Take 1,000 mcg by mouth daily at 12 noon.     [provider]  cyanocobalamin 1000  MCG tablet Take by mouth.     [provider]  finasteride (PROSCAR) 5 MG tablet Take 1 tablet (5 mg total) by mouth every evening. Patient not taking: Reported on 06/04/2020 11/08/19   Hollice Espy, MD  folic acid (FOLVITE) 1 MG tablet Take 1 mg by mouth daily at 12 noon.  03/20/15   [provider]  latanoprost (XALATAN) 0.005 % ophthalmic solution Place 1 drop into both eyes at bedtime.     [provider]  levofloxacin (LEVAQUIN) 250 MG tablet Take 250 mg by mouth daily.     [provider]  midodrine (PROAMATINE) 5 MG tablet Take 1 tablet (5 mg total) by mouth 3 (three) times daily with meals. 04/30/20   Jennye Boroughs, MD  ondansetron (ZOFRAN-ODT) 4 MG disintegrating tablet Take 1 tablet (4 mg total) by mouth every 8 (eight) hours as needed for nausea or vomiting. 06/01/20   Borders, Kirt Boys, NP  POSACONAZOLE PO Take 300 mg by mouth daily. Patient not taking: Reported on 06/04/2020    [provider]  potassium chloride SA (KLOR-CON M20) 20 MEQ tablet Take 1 tablet (20 mEq total) by mouth every other day. Patient taking every other day Patient not taking: Reported on 06/04/2020 06/02/20  Lequita Asal, MD  sodium bicarbonate 650 MG tablet Take 650 mg by mouth 2 (two) times daily. 06/06/17   [provider]  timolol (TIMOPTIC) 0.5 % ophthalmic solution Place 1 drop into both eyes daily. In AM 05/27/18   [provider]  valACYclovir (VALTREX) 500 MG tablet Take 500 mg by mouth daily.    [provider]  VENCLEXTA 100 MG TABS  05/24/20   [provider]    Allergies Patient has no known allergies.  Family History  Problem Relation Age of Onset  . Cancer Mother        Liver  . Cancer Father        Prostate  . Cancer Sister        Hodgkins lymphoma  . Cancer Paternal Aunt        Breast    Social History Social History   Tobacco Use  . Smoking status: Former Smoker    Packs/day: 0.25    Years: 20.00      Pack years: 5.00    Types: Cigarettes    Quit date: 09/28/2003    Years since quitting: 16.7  . Smokeless tobacco: Never Used  Vaping Use  . Vaping Use: Never used  Substance Use Topics  . Alcohol use: Not Currently    Alcohol/week: 4.0 - 5.0 standard drinks    Types: 4 - 5 Glasses of wine per week  . Drug use: No    Review of Systems  Constitutional: No fever/chills Eyes: No visual changes. ENT: No sore throat. Cardiovascular: Denies chest pain. Respiratory: Cough and shortness of breath. Gastrointestinal: No abdominal pain.  No nausea, no vomiting.  No diarrhea.  No constipation. Genitourinary: Negative for dysuria. Musculoskeletal: Negative for back pain. Skin: Negative for rash. Neurological: Negative for headaches, focal weakness or numbness.  Positive for confusion, now resolving.   ____________________________________________   PHYSICAL EXAM:  VITAL SIGNS: Vitals:   06/08/20 1658 06/08/20 1900  BP: (!) 145/74 131/75  Pulse: (!) 117 (!) 105  Resp: (!) 21 (!) 32  Temp: 98.8 F (37.1 C)   SpO2: 94% 93%      Constitutional: Alert and oriented to person, place and time.  Tachypneic to the 30s without distress. Eyes: Conjunctivae are normal. PERRL. EOMI. Head: Atraumatic. Nose: No congestion/rhinnorhea. Mouth/Throat: Mucous membranes are moist.  Oropharynx non-erythematous. Neck: No stridor. No cervical spine tenderness to palpation. Cardiovascular: Tachycardic rate, regular rhythm. Grossly normal heart sounds.  Good peripheral circulation. Respiratory: Normal respiratory effort.  No retractions. Lungs CTAB. Gastrointestinal: Soft , nondistended, nontender to palpation. No abdominal bruits. No CVA tenderness. Musculoskeletal: No lower extremity tenderness nor edema.  No joint effusions. No signs of acute trauma. Port in place to the right chest, C/D/I Neurologic:  Normal speech and language. No gross focal neurologic deficits are appreciated. No gait  instability noted. Skin:  Skin is warm, dry and intact. No rash noted. Psychiatric: Mood and affect are normal. Speech and behavior are normal.  ____________________________________________   LABS (all labs ordered are listed, but only abnormal results are displayed)  Labs Reviewed  URINALYSIS, COMPLETE (UACMP) WITH MICROSCOPIC - Abnormal; Notable for the following components:      Result Value   Color, Urine YELLOW (*)    APPearance CLEAR (*)    Protein, ur 100 (*)    Bacteria, UA RARE (*)    All other components within normal limits  SARS CORONAVIRUS 2 BY RT PCR (HOSPITAL ORDER, [place]  [facility])  URINE CULTURE    ____________________________________________  12 Lead EKG  Sinus rhythm, rate of 117 bpm.  Normal axis.  Right bundle branch block at baseline, otherwise normal intervals.  No evidence of acute ischemia. ____________________________________________  RADIOLOGY  ED MD interpretation: CXR reviewed with minimal left-sided pleural effusion, but otherwise clear and without infiltrates   Official radiology report(s): DG Chest 2 View  Result Date: 06/08/2020 CLINICAL DATA:  Chronic cough, shortness of breath EXAM: CHEST - 2 VIEW COMPARISON:  Prior chest x-ray 04/26/2020 FINDINGS: Tunneled right IJ central venous catheter present. The tip overlies the superior cavoatrial junction. Stable mild cardiomegaly. Mild pulmonary vascular congestion without overt edema. Significantly improved aeration compared to the prior radiograph. Small layering left pleural effusion with associated atelectasis. Stable chronic bronchitic changes and areas of linear scarring. No pneumothorax. No focal airspace infiltrate. IMPRESSION: 1. Overall, significantly improved aeration compared to prior imaging from 04/26/2020. 2. Stable cardiomegaly and pulmonary vascular congestion without overt edema. 3. Small left pleural effusion with associated atelectasis. 4. Tunneled right IJ  central line in good position. Electronically Signed   By: Jacqulynn Cadet M.D.   On: 06/08/2020 14:04    ____________________________________________   PROCEDURES and INTERVENTIONS  Procedure(s) performed (including Critical Care):  Procedures  Medications  ceFEPIme (MAXIPIME) 2 g in sodium chloride 0.9 % 100 mL IVPB (2 g Intravenous New Bag/Given 06/08/20 1918)  vancomycin (VANCOREADY) IVPB 1750 mg/350 mL (has no administration in time range)  sodium chloride 0.9 % bolus 1,000 mL (1,000 mLs Intravenous New Bag/Given 06/08/20 1711)    ____________________________________________   MDM / ED COURSE  74 year old immunocompromised patient presenting with evidence of sepsis requiring medical admission.  Tachycardic with low-grade fevers, otherwise hemodynamically stable.  Exam is quite reassuring demonstrating a well-appearing patient who has no distress or complaints.  He reports bilateral chronic knee pain, otherwise reports feeling better after receiving IV fluids.  He is noted to be tachypneic into the low 30s with normal breath sounds, and in conjunction with his symptoms of coughing this afternoon and new pleural effusion on his chest x-ray, I am concerned about pneumonia as the source of his sepsis.  Outpatient blood work from the cancer center reviewed indicating lactic acidosis and elevated procalcitonin.  In conjunction with his fever and tachycardia, sepsis criteria is met.  Blood cultures were drawn as outpatient at the cancer center, and we added urine cultures to this due to his history of self cathing.  Patient covered broadly with cefepime and vancomycin.  Improving tachycardia with IV fluids.  Will admit the patient to hospitalist medicine for further work-up and management of his septic condition.  Clinical Course as of Jun 08 1937  Fri Jun 08, 2020  1935 Discussed case with admitting hospitalist, who agrees to see the patient for admission   [DS]    Clinical Course User  Index [DS] Vladimir Crofts, MD     ____________________________________________   FINAL CLINICAL IMPRESSION(S) / ED DIAGNOSES  Final diagnoses:  Sepsis without acute organ dysfunction, due to unspecified organism [facility])  Sinus tachycardia  Fever in other diseases  Tachypnea  Shortness of breath     ED Discharge Orders    None       Blyss Lugar Tamala Julian   [name]:  This document was prepared using Dragon voice recognition software and may include unintentional dictation errors.   Vladimir Crofts, MD 06/08/20 [phone]

## 2020-06-08 NOTE — Progress Notes (Signed)
Symptom Management Consult note [facility]  [phone] [phone]  Patient Care Team: [name], MD as PCP - General (Family Medicine) [name], MD (Inactive) (Gastroenterology) Dingeldein, Remo Lipps, MD (Ophthalmology) Lequita Asal, MD as Referring Physician (Hematology and Oncology) Dasher, Rayvon Char, MD as Referring Physician (Dermatology) Anthonette Legato, MD as Consulting Physician (Internal Medicine) Hollice Espy, MD as Consulting Physician (Urology) Crissie Sickles, MD as Referring Physician (Hematology and Oncology) Vladimir Crofts, MD as Consulting Physician (Neurology)   Name of the patient: Ruben [name]  [id number]  [birth year]   Date of visit: 06/08/2020   Diagnosis- AML   Chief complaint/ Reason for visit- Cough- low grade temp  Heme/Onc history:  Oncology History  Acute myeloid leukemia not having achieved remission ([place])  03/07/2020 Initial Diagnosis   Acute myeloid leukemia not having achieved remission ([place])   05/21/2020 -  Chemotherapy   The patient had azaCITIDine (VIDAZA) chemo injection 150 mg, 78.5 mg/m2 = 142.5 mg, Subcutaneous,  Once, 1 of 4 cycles Administration: 150 mg (05/21/2020), 150 mg (05/22/2020), 150 mg (05/23/2020), 150 mg (05/24/2020), 150 mg (05/25/2020), 150 mg (05/29/2020), 150 mg (05/30/2020)  for chemotherapy treatment.      Interval history-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  Unfortunately, has slowly started to decline over the past few weeks with poor oral intake.  He was evaluated by Billey Chang to discuss goals of care.   Today, patient was seen this morning by Altha Harm.  He had lab work  that did not indicate he needed a blood transfusion and he was sent home.  He appeared well.  Admitted to a fairly chronic cough.  He was given [employer]    After leaving clinic, patient's had a "coughing attack" and was unable to catch his breath.  Wife states he has not been "right" since. He has had very little to eat or drink and appears confused.  Patient was instructed to have a chest x-ray and be evaluated in smc.  Reports a low-grade temperature of 100.2, very weak, pale and lethargic. Wife states he required a wheelchair to get his chest x-ray and into our clinic when he typically walks. Denies any acute pain but has chronic bilateral knee pain. No chest pain or sob.   ECOG FS:1 - Symptomatic but completely ambulatory  Review of systems- Review of Systems  Constitutional: Positive for fever and malaise/fatigue. Negative for chills and weight loss.  HENT: Negative for congestion, ear pain and tinnitus.   Eyes: Negative.  Negative for blurred vision and double vision.  Respiratory: Positive for cough. Negative for sputum production and shortness of breath.   Cardiovascular: Negative.  Negative for chest pain, palpitations and leg swelling.  Gastrointestinal: Negative.  Negative for abdominal pain, constipation, diarrhea, nausea and vomiting.  Genitourinary: Negative for dysuria, frequency and urgency.  Musculoskeletal: Positive for joint pain. Negative for back pain and falls.  Skin: Negative.  Negative for rash.  Neurological: Positive for weakness. Negative for headaches.  Endo/Heme/Allergies: Negative.  Does not bruise/bleed easily.  Psychiatric/Behavioral: Positive for memory loss. Negative for depression. The patient is not nervous/anxious and does not have insomnia.       Current treatment- vidaza  No Known Allergies  Past Medical History:  Diagnosis Date  . Blood dyscrasia   . BPH (benign prostatic hyperplasia)   . Cancer ([place])    SKIN/ POLYCYTHEMIA VERA  .  Chronic kidney disease    RENAL INSUFF (40%)  . Crohn disease ([place])   . Crohn's disease ([place])   . Glaucoma   . Glaucoma   . Gout   . History of chicken pox   . Myelofibrosis ([place])   . Nosebleed   . RBBB   . Right bundle branch block      Past Surgical History:  Procedure Laterality Date  . CATARACT EXTRACTION W/PHACO Left 07/13/2018   Procedure: CATARACT EXTRACTION PHACO AND INTRAOCULAR LENS PLACEMENT (IOC);  Surgeon: [name], MD;  Location: [facility] ORS;  Service: Ophthalmology;  Laterality: Left;  [place] 00:39  CDE 5.14 Fluid pack lot # [id number]  . CATARACT EXTRACTION W/PHACO Right 08/03/2018   Procedure: CATARACT EXTRACTION PHACO AND INTRAOCULAR LENS PLACEMENT (IOC);  Surgeon: [name], MD;  Location: [facility] ORS;  Service: Ophthalmology;  Laterality: Right;  [place] 00:43.4 CDE 5.23 Fluid pack Lot # [id number]  . COLONOSCOPY WITH PROPOFOL N/A 03/27/2017   Procedure: COLONOSCOPY WITH PROPOFOL;  Surgeon: [name], MD;  Location: [facility] ENDOSCOPY;  Service: Endoscopy;  Laterality: N/A;  . IR FLUORO GUIDE CV LINE LEFT  05/18/2020  . MOHS SURGERY     EAR  . PROSTATE SURGERY  2008   Prostate Biopsy in [place] to elevated PSA.  per patient normal  . Skin Lesion Basal cell removed    . wart removal     from eyelid    Social History   Socioeconomic History  . Marital status: Married    Spouse name: Not on file  . Number of children: 2  . Years of education: Not on file  . Highest education level: Bachelor's degree (e.g., BA, AB, BS)  Occupational History  . Occupation: Retired  Tobacco Use  . Smoking status: Former Smoker    Packs/day: 0.25    Years: 20.00    Pack years: 5.00    Types: Cigarettes    Quit date: 09/28/2003    Years since quitting: 16.7  . Smokeless tobacco: Never Used  Vaping Use  . Vaping Use: Never used  Substance and Sexual Activity  . Alcohol use: Not Currently    Alcohol/week: 4.0 - 5.0 standard drinks    Types: 4 - 5 Glasses of  wine per week  . Drug use: No  . Sexual activity: Not on file  Other Topics Concern  . Not on file  Social History Narrative  . Not on file   Social Determinants of Health   Financial Resource Strain:   . Difficulty of Paying Living Expenses: Not on file  Food Insecurity:   . Worried About Charity fundraiser in the Last Year: Not on file  . Ran Out of Food in the Last Year: Not on file  Transportation Needs:   . Lack of Transportation (Medical): Not on file  . Lack of Transportation (Non-Medical): Not on file  Physical Activity:   . Days of Exercise per Week: Not on file  . Minutes of Exercise per Session: Not on file  Stress:   . Feeling of Stress : Not on file  Social Connections:   . Frequency of Communication with Friends and Family: Not on file  . Frequency of Social Gatherings with Friends and Family: Not on file  . Attends Religious Services: Not on file  .  Active Member of Clubs or Organizations: Not on file  . Attends Archivist Meetings: Not on file  . Marital Status: Not on file  Intimate Partner Violence:   . Fear of Current or Ex-Partner: Not on file  . Emotionally Abused: Not on file  . Physically Abused: Not on file  . Sexually Abused: Not on file    Family History  Problem Relation Age of Onset  . Cancer Mother        Liver  . Cancer Father        Prostate  . Cancer Sister        Hodgkins lymphoma  . Cancer Paternal Aunt        Breast    No current facility-administered medications for this visit. No current outpatient medications on file.  Facility-Administered Medications Ordered in Other Visits:  .  0.9 %  sodium chloride infusion, , Intravenous, PRN, Shahmehdi, Seyed A, MD, Paused at 06/13/20 2118 .  acetaminophen (TYLENOL) tablet 650 mg, 650 mg, Oral, Q6H PRN, 650 mg at 06/10/20 2031 **OR** acetaminophen (TYLENOL) suppository 650 mg, 650 mg, Rectal, Q6H PRN, Zada Finders R, MD .  allopurinol (ZYLOPRIM) tablet 150 mg, 150 mg,  Oral, Daily, Zada Finders R, MD, 150 mg at 06/14/20 4268 .  ALPRAZolam (XANAX) tablet 0.25 mg, 0.25 mg, Oral, TID PRN, Priscella Mann, Sudheer B, MD .  amLODipine (NORVASC) tablet 5 mg, 5 mg, Oral, Daily, Shahmehdi, Seyed A, MD, 5 mg at 06/14/20 0823 .  benzonatate (TESSALON) capsule 100 mg, 100 mg, Oral, BID PRN, Posey Pronto, Vishal R, MD .  ceFEPIme (MAXIPIME) 2 g in sodium chloride 0.9 % 100 mL IVPB, 2 g, Intravenous, Q12H, Rowland Lathe, RPH, Stopped at 06/14/20 3419 .  Chlorhexidine Gluconate Cloth 2 % PADS 6 each, 6 each, Topical, Daily, Shahmehdi, Seyed A, MD, 6 each at 06/13/20 0926 .  enoxaparin (LOVENOX) injection 40 mg, 40 mg, Subcutaneous, Q24H, Dallie Piles, RPH, 40 mg at 06/12/20 1801 .  feeding supplement (ENSURE ENLIVE) (ENSURE ENLIVE) liquid 237 mL, 237 mL, Oral, TID BM, Shahmehdi, Seyed A, MD, 237 mL at 06/14/20 0827 .  finasteride (PROSCAR) tablet 5 mg, 5 mg, Oral, QPM, Shahmehdi, Seyed A, MD, 5 mg at 06/13/20 1815 .  folic acid (FOLVITE) tablet 1 mg, 1 mg, Oral, Q1200, Zada Finders R, MD, 1 mg at 06/14/20 1146 .  hydrALAZINE (APRESOLINE) injection 10 mg, 10 mg, Intravenous, Q4H PRN, Shahmehdi, Seyed A, MD .  latanoprost (XALATAN) 0.005 % ophthalmic solution 1 drop, 1 drop, Both Eyes, QHS, Patel, Vishal R, MD, 1 drop at 06/13/20 2023 .  loperamide HCl (IMODIUM) 1 MG/7.5ML suspension 1 mg, 1 mg, Oral, PRN, Priscella Mann, Sudheer B, MD .  MEDLINE mouth rinse, 15 mL, Mouth Rinse, BID, Shahmehdi, Seyed A, MD, 15 mL at 06/14/20 0842 .  multivitamin with minerals tablet 1 tablet, 1 tablet, Oral, Daily, Shahmehdi, Seyed A, MD, 1 tablet at 06/14/20 6222 .  ondansetron (ZOFRAN) tablet 4 mg, 4 mg, Oral, Q6H PRN **OR** ondansetron (ZOFRAN) injection 4 mg, 4 mg, Intravenous, Q6H PRN, Patel, Vishal R, MD .  timolol (TIMOPTIC) 0.5 % ophthalmic solution 1 drop, 1 drop, Both Eyes, Daily, Zada Finders R, MD, 1 drop at 06/14/20 0828 .  vitamin B-12 (CYANOCOBALAMIN) tablet 1,000 mcg, 1,000 mcg, Oral, Q1200,  Lenore Cordia, MD, 1,000 mcg at 06/14/20 1146  Physical exam:  Vitals:   06/08/20 1417 06/08/20 1600  BP: (!) 158/82 138/85  Pulse: (!) 126 (!) 115  Resp: 20 18  Temp: 99 F (37.2 C) (!) 100.9 F (38.3 C)  TempSrc:  Oral  SpO2: 97% (P) 96%   Physical Exam Constitutional:      Appearance: Normal appearance.  HENT:     Head: Normocephalic and atraumatic.  Eyes:     Pupils: Pupils are equal, round, and reactive to light.  Cardiovascular:     Rate and Rhythm: Normal rate and regular rhythm.     Heart sounds: Normal heart sounds. No murmur heard.   Pulmonary:     Effort: Pulmonary effort is normal.     Breath sounds: Normal breath sounds. No wheezing.  Abdominal:     General: Bowel sounds are normal. There is no distension.     Palpations: Abdomen is soft.     Tenderness: There is no abdominal tenderness.  Musculoskeletal:        General: Normal range of motion.     Cervical back: Normal range of motion.  Skin:    General: Skin is warm and dry.     Coloration: Skin is pale.     Findings: No rash.  Neurological:     Mental Status: He is alert. He is confused.     Motor: Weakness present.  Psychiatric:        Judgment: Judgment normal.      CMP Latest Ref Rng & Units 06/14/2020  Glucose 70 - 99 mg/dL 90  BUN 8 - 23 mg/dL 35(H)  Creatinine 0.61 - 1.24 mg/dL 1.27(H)  Sodium 135 - 145 mmol/L 130(L)  Potassium 3.5 - 5.1 mmol/L 3.1(L)  Chloride 98 - 111 mmol/L 101  CO2 22 - 32 mmol/L 23  Calcium 8.9 - 10.3 mg/dL 10.3  Total Protein 6.5 - 8.1 g/dL -  Total Bilirubin 0.3 - 1.2 mg/dL -  Alkaline Phos 38 - 126 U/L -  AST 15 - 41 U/L -  ALT 0 - 44 U/L -   CBC Latest Ref Rng & Units 06/14/2020  WBC 4.0 - 10.5 K/uL 5.8  Hemoglobin 13.0 - 17.0 g/dL 8.4(L)  Hematocrit 39 - 52 % 26.7(L)  Platelets 150 - 400 K/uL 229    No images are attached to the encounter.  DG Chest 1 View  Result Date: 06/11/2020 CLINICAL DATA:  Shortness of breath EXAM: CHEST  1 VIEW  COMPARISON:  June 09, 2020 FINDINGS: Central catheter tip is in the superior vena cava. No pneumothorax. There is cardiomegaly with pulmonary vascularity within normal limits. There is airspace opacity in the left lower lobe. Elsewhere there is interstitial prominence in the mid lung regions. No appreciable adenopathy. No bone lesions. IMPRESSION: Airspace opacity consistent with pneumonia in the left lower lung region and to a lesser extent in each mid lung region. Appearance indicative of multifocal pneumonia. Heart remains prominent with pulmonary vascularity within normal limits. No adenopathy. Central catheter tip in superior vena cava without pneumothorax. Electronically Signed   By: Lowella Grip III M.D.   On: 06/11/2020 08:34   DG Chest 2 View  Result Date: 06/08/2020 CLINICAL DATA:  Chronic cough, shortness of breath EXAM: CHEST - 2 VIEW COMPARISON:  Prior chest x-ray 04/26/2020 FINDINGS: Tunneled right IJ central venous catheter present. The tip overlies the superior cavoatrial junction. Stable mild cardiomegaly. Mild pulmonary vascular congestion without overt edema. Significantly improved aeration compared to the prior radiograph. Small layering left pleural effusion with associated atelectasis. Stable chronic bronchitic changes and areas of linear scarring. No pneumothorax. No focal airspace infiltrate. IMPRESSION: 1.  Overall, significantly improved aeration compared to prior imaging from 04/26/2020. 2. Stable cardiomegaly and pulmonary vascular congestion without overt edema. 3. Small left pleural effusion with associated atelectasis. 4. Tunneled right IJ central line in good position. Electronically Signed   By: Jacqulynn Cadet M.D.   On: 06/08/2020 14:04   NM Pulmonary Perfusion  Result Date: 06/11/2020 CLINICAL DATA:  Shortness of breath EXAM: NUCLEAR MEDICINE PERFUSION LUNG SCAN TECHNIQUE: Perfusion images were obtained in multiple projections after intravenous injection of  radiopharmaceutical. Views: Anterior, posterior, left lateral, right lateral, RPO, LPO, RAO, LAO RADIOPHARMACEUTICALS:  4.37 mCi Tc-35mMAA IV COMPARISON:  Chest radiograph June 11, 2020 FINDINGS: There is slight decreased radiotracer uptake in each apex, nonsegmental. Elsewhere, the distribution of uptake on the perfusion study is normal. IMPRESSION: Slightly decreased radiotracer uptake in the apex regions, nonsegmental, a finding of questionable significance. This study is considered low probability for pulmonary embolus. Electronically Signed   By: WLowella GripIII M.D.   On: 06/11/2020 12:02   [place]Venous Img Lower Bilateral (DVT)  Result Date: 06/09/2020 CLINICAL DATA:  Bilateral lower extremity pain 1 week. EXAM: BILATERAL LOWER EXTREMITY VENOUS DOPPLER ULTRASOUND TECHNIQUE: Gray-scale sonography with compression, as well as color and duplex ultrasound, were performed to evaluate the deep venous system(s) from the level of the common femoral vein through the popliteal and proximal calf veins. COMPARISON:  None. FINDINGS: VENOUS Normal compressibility of the common femoral, superficial femoral, and popliteal veins, as well as the visualized calf veins bilaterally. Visualized portions of profunda femoral vein and great saphenous vein unremarkable bilaterally. No filling defects to suggest DVT on grayscale or color Doppler imaging. Doppler waveforms show normal direction of venous flow, normal respiratory plasticity and response to augmentation bilaterally. OTHER None. Limitations: none IMPRESSION: Negative bilateral lower extremity Doppler evaluation. Electronically Signed   By: DMarin OlpM.D.   On: 06/09/2020 17:09   IR Fluoro Guide CV Line Left  Result Date: 05/18/2020 CLINICAL DATA:  AML, needs venous access for treatment regimen. Renal insufficiency. EXAM: TUNNELED CENTRAL VENOUS CATHETER PLACEMENT WITH ULTRASOUND AND FLUOROSCOPIC GUIDANCE TECHNIQUE: The procedure, risks, benefits, and  alternatives were explained to the patient. Questions regarding the procedure were encouraged and answered. The patient understands and consents to the procedure. Patency of the right IJ vein was confirmed with ultrasound with image documentation. An appropriate skin site was determined. Region was prepped using maximum barrier technique including cap and mask, sterile gown, sterile gloves, large sterile sheet, and Chlorhexidine as cutaneous antisepsis. The region was infiltrated locally with 1% lidocaine. Under real-time ultrasound guidance, the right IJ vein was accessed with a 21 gauge micropuncture needle; the needle tip within the vein was confirmed with ultrasound image documentation. 12F single-lumen cuffed PowerLine tunneled from a right anterior chest wall approach to the dermatotomy site. Needle exchanged over the 018 guidewire for transitional dilator, through which the catheter which had been cut to 22 cm was advanced under intermittent fluoroscopy, positioned with its tip at the cavoatrial junction. Spot chest radiograph confirms good catheter position. No pneumothorax. Catheter was flushed per protocol. Catheter secured externally with O Prolene suture. The right IJ dermatotomy site was closed with Dermabond. COMPLICATIONS: COMPLICATIONS None immediate FLUOROSCOPY TIME:  Less than 0.1 minute; 0.25 mGy COMPARISON:  None IMPRESSION: 1. Technically successful placement of tunneled right IJ tunneled single-lumen power injectable catheter with ultrasound and fluoroscopic guidance. Ready for routine use. Electronically Signed   By: DLucrezia EuropeM.D.   On: 05/18/2020 11:33  DG Chest Port 1 View  Result Date: 06/09/2020 CLINICAL DATA:  Dyspnea, tachypnea, tachycardia EXAM: PORTABLE CHEST 1 VIEW COMPARISON:  06/08/2020 chest radiograph. FINDINGS: Right internal jugular central venous catheter terminates in the lower third of the SVC. Stable cardiomediastinal silhouette with mild cardiomegaly. No pneumothorax.  Small left pleural effusion is unchanged. No significant right pleural effusion. Diffuse hazy parahilar interstitial lung opacities, increased. IMPRESSION: 1. Stable mild cardiomegaly. Diffuse hazy parahilar interstitial lung opacities, increased, favor pulmonary edema. 2. Stable small left pleural effusion. Electronically Signed   By: Ilona Sorrel M.D.   On: 06/09/2020 15:21   CT BONE MARROW BIOPSY & ASPIRATION  Result Date: 06/12/2020 CLINICAL DATA:  Acute myeloid leukemia and need for bone marrow biopsy. EXAM: CT GUIDED BONE MARROW ASPIRATION AND BIOPSY ANESTHESIA/SEDATION: Versed 1.0 mg IV, Fentanyl 50 mcg IV Total Moderate Sedation Time:   10 minutes. The patient's level of consciousness and physiologic status were continuously monitored during the procedure by Radiology nursing. PROCEDURE: The procedure risks, benefits, and alternatives were explained to the patient. Questions regarding the procedure were encouraged and answered. The patient understands and consents to the procedure. A time out was performed prior to initiating the procedure. The right gluteal region was prepped with chlorhexidine. Sterile gown and sterile gloves were used for the procedure. Local anesthesia was provided with 1% Lidocaine. Under CT guidance, an 11 gauge On Control bone cutting needle was advanced from a posterior approach into the right iliac bone. Needle positioning was confirmed with CT. Initial non heparinized and heparinized aspirate samples were obtained of bone marrow. Core biopsy was performed via the On Control drill needle. COMPLICATIONS: None FINDINGS: Inspection of initial aspirate did reveal visible particles. Intact core biopsy sample was obtained. IMPRESSION: CT guided bone marrow biopsy of right posterior iliac bone with both aspirate and core samples obtained. Electronically Signed   By: Aletta Edouard M.D.   On: 06/12/2020 12:51     Assessment and plan- Patient is a 74 y.o. male who presents for acute  onset confusion, fever and weakness x 4 hours.  AML- Followed by Dr. Mike Gip- on vidaza and azacytidine and requiring bi weekly blood transfusion.   Confusion/weakness/fever- unclear etiology. Given acute onset (<4 hours) will re-draw labs, check vitals and give IVF. Pro calcitonin and lactic acid elevated. Spoke with Dr. Mike Gip who recommends admission to hospital. Patient and wife taken to ED for evalaution. Differentials include sepsis, hypoglycemia or severe dehydration.   Plan-  IVF Labs (blood cultures X2, lactic acid, pro calcitonin and BMP)  Disposition-  ED  Visit Diagnosis 1. Fever, unspecified fever cause   2. Acute myeloid leukemia not having achieved remission (HCC)   3. Weakness   4. 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 He also understands that He can call clinic at any time with any questions, concerns, or complaints.   Greater than 50% was spent in counseling and coordination of care with this patient including but not limited to discussion of the relevant topics above (See A&P) including, but not limited to diagnosis and management of acute and chronic medical conditions.   Thank you for allowing me to participate in the care of this very pleasant patient.    Jacquelin Hawking, NP [place] at [facility] Cell - [phone] Pager- [phone] 06/14/2020 12:13 PM

## 2020-06-08 NOTE — ED Notes (Addendum)
See triage note, pt reports overall not feeling well today, went to cancer center for treatement. No appetite, cough.  Pt ST on monitor, 93% on RA DR smith at bedside to assess.  Wife at bedside Pt alert and oriented. Clear speech Pt tachypneic  C/o bilateral knee pain.

## 2020-06-08 NOTE — Telephone Encounter (Signed)
Critical Lactic Acid 2.2. Pt currently on his way to the ED after receiving care in the cancer center this afternoon.

## 2020-06-08 NOTE — Telephone Encounter (Signed)
Called report  Lactic Acid, Plasma Order: [id number] Status:  Final result Visible to patient:  No (scheduled for 06/08/2020 5:18 PM) Next appt:  06/11/2020 at 08:15 AM in Oncology ([employer] [facility]) Dx:  Fever, unspecified fever cause  0 Result Notes  Ref Range & Units 15:14 1 mo ago  Lactic Acid, Venous 0.5 - 1.9 mmol/L 2.2High Panic  2.4High Panic CM   Comment: CRITICAL RESULT CALLED TO, READ BACK BY AND VERIFIED WITH Orlen Leedy AT 1617 06/08/20.PMF  Performed at [facility], [address] [place], [place] [address]   Resulting Agency  [facility] CLIN LAB [facility]      Specimen Collected: 06/08/20 15:14 Last Resulted: 06/08/20 16:18

## 2020-06-08 NOTE — Consult Note (Signed)
PHARMACY -  BRIEF ANTIBIOTIC NOTE   Pharmacy has received consult(s) for vancomycin from an ED provider.  The patient's profile has been reviewed for ht/wt/allergies/indication/available labs.    One time order(s) placed for vancomycin 1750 mg IV x 1 dose  Further antibiotics/pharmacy consults should be ordered by admitting physician if indicated.                       Thank you,  Benn Moulder, PharmD Pharmacy Resident  06/08/2020 7:22 PM

## 2020-06-08 NOTE — Progress Notes (Signed)
Labs drawn per orders. Labs reviewed with MD, per MD patient does not need blood transfusion today and may be discharged at this time. Pt updated and all questions answered at this time. Central line dressing changed and cleaned. Pt stable for discharge.   Ruben [name] [employer]

## 2020-06-08 NOTE — ED Notes (Signed)
Pharmacy messaged to verify vanc

## 2020-06-08 NOTE — ED Triage Notes (Signed)
Pt started feeling bad this AM and went to cancer center.  Was given fluids and had labs.  Elevated lactic and procalcitonin.  Blood cultures already drawn today.  Pt c/o pain to bilateral knees at this time. CXR done already.    NAD, unlabored at this time.

## 2020-06-08 NOTE — Progress Notes (Signed)
[place]  Telephone:([phone] [phone]   Name: Ruben [name] Date: 06/08/2020 MRN: [id number]  DOB: Sep 29, 1945  Patient Care Team: [name], MD as PCP - General (Family Medicine) [name], MD (Inactive) (Gastroenterology) Dingeldein, Remo Lipps, MD (Ophthalmology) Lequita Asal, MD as Referring Physician (Hematology and Oncology) Dasher, Rayvon Char, MD as Referring Physician (Dermatology) Anthonette Legato, MD as Consulting Physician (Internal Medicine) Hollice Espy, MD as Consulting Physician (Urology) Crissie Sickles, MD as Referring Physician (Hematology and Oncology) Vladimir Crofts, MD as Consulting Physician (Neurology)    REASON FOR CONSULTATION: Ruben [name] is a 74 y.o. 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  He has had progressive decline with fatigue and poor oral intake.  Patient was referred to palliative care to help address goals and manage ongoing symptoms.  SOCIAL HISTORY:     reports that he quit smoking about 16 years ago. His smoking use included cigarettes. He has a 5.00 pack-year smoking history. He has never used smokeless tobacco. He reports previous alcohol use of about 4.0 - 5.0 standard drinks of alcohol per week. He reports that he does not use drugs.   Patient is married and lives at home with his wife.  He has 2 adult children, one of whom lives in [place] and the other lives locally.  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  ADVANCE DIRECTIVES:  On file  CODE STATUS:   PAST MEDICAL HISTORY: Past Medical History:  Diagnosis Date  . Blood dyscrasia   . BPH (benign prostatic hyperplasia)   . Cancer ([place])    SKIN/ POLYCYTHEMIA VERA  . Chronic kidney disease    RENAL INSUFF (40%)  . Crohn disease ([place])   .  Crohn's disease ([place])   . Glaucoma   . Glaucoma   . Gout   . History of chicken pox   . Myelofibrosis ([place])   . Nosebleed   . RBBB   . Right bundle branch block     PAST SURGICAL HISTORY:  Past Surgical History:  Procedure Laterality Date  . CATARACT EXTRACTION W/PHACO Left 07/13/2018   Procedure: CATARACT EXTRACTION PHACO AND INTRAOCULAR LENS PLACEMENT (IOC);  Surgeon: [name], MD;  Location: [facility] ORS;  Service: Ophthalmology;  Laterality: Left;  [place] 00:39  CDE 5.14 Fluid pack lot # [id number]  . CATARACT EXTRACTION W/PHACO Right 08/03/2018   Procedure: CATARACT EXTRACTION PHACO AND INTRAOCULAR LENS PLACEMENT (IOC);  Surgeon: [name], MD;  Location: [facility] ORS;  Service: Ophthalmology;  Laterality: Right;  [place] 00:43.4 CDE 5.23 Fluid pack Lot # [id number]  . COLONOSCOPY WITH PROPOFOL N/A 03/27/2017   Procedure: COLONOSCOPY WITH PROPOFOL;  Surgeon: [name], MD;  Location: [facility] ENDOSCOPY;  Service: Endoscopy;  Laterality: N/A;  . IR FLUORO GUIDE CV LINE LEFT  05/18/2020  . MOHS SURGERY     EAR  . PROSTATE SURGERY  2008   Prostate Biopsy in [place] to elevated PSA.  per patient normal  . Skin Lesion Basal cell removed    . wart removal     from eyelid    HEMATOLOGY/ONCOLOGY HISTORY:  Oncology History  Acute myeloid leukemia not having achieved remission ([place])  03/07/2020 Initial Diagnosis   Acute myeloid leukemia not having achieved remission ([place])   05/21/2020 -  Chemotherapy   The patient had azaCITIDine (VIDAZA) chemo  injection 150 mg, 78.5 mg/m2 = 142.5 mg, Subcutaneous,  Once, 1 of 4 cycles Administration: 150 mg (05/21/2020), 150 mg (05/22/2020), 150 mg (05/23/2020), 150 mg (05/24/2020), 150 mg (05/25/2020), 150 mg (05/29/2020), 150 mg (05/30/2020)  for chemotherapy treatment.      ALLERGIES:  has No Known Allergies.  MEDICATIONS:  Current Outpatient Medications  Medication Sig Dispense Refill  . acetaminophen (TYLENOL) 500 MG tablet Take 500 mg by  mouth daily as needed for moderate pain. (Patient not taking: Reported on 06/04/2020)    . allopurinol (ZYLOPRIM) 300 MG tablet TAKE 1/2 TABLET (150 MG TOTAL) BY MOUTH DAILY. 45 tablet 1  . Cyanocobalamin (RA VITAMIN B-12 TR) 1000 MCG TBCR Take 1,000 mcg by mouth daily at 12 noon.     . cyanocobalamin 1000 MCG tablet Take by mouth.     . finasteride (PROSCAR) 5 MG tablet Take 1 tablet (5 mg total) by mouth every evening. (Patient not taking: Reported on 06/04/2020) 90 tablet 3  . folic acid (FOLVITE) 1 MG tablet Take 1 mg by mouth daily at 12 noon.     . latanoprost (XALATAN) 0.005 % ophthalmic solution Place 1 drop into both eyes at bedtime.     Marland Kitchen levofloxacin (LEVAQUIN) 250 MG tablet Take 250 mg by mouth daily.     . midodrine (PROAMATINE) 5 MG tablet Take 1 tablet (5 mg total) by mouth 3 (three) times daily with meals. 90 tablet 0  . ondansetron (ZOFRAN-ODT) 4 MG disintegrating tablet Take 1 tablet (4 mg total) by mouth every 8 (eight) hours as needed for nausea or vomiting. 45 tablet 0  . POSACONAZOLE PO Take 300 mg by mouth daily. (Patient not taking: Reported on 06/04/2020)    . potassium chloride SA (KLOR-CON M20) 20 MEQ tablet Take 1 tablet (20 mEq total) by mouth every other day. Patient taking every other day (Patient not taking: Reported on 06/04/2020) 30 tablet 0  . sodium bicarbonate 650 MG tablet Take 650 mg by mouth 2 (two) times daily.    . timolol (TIMOPTIC) 0.5 % ophthalmic solution Place 1 drop into both eyes daily. In AM  5  . valACYclovir (VALTREX) 500 MG tablet Take 500 mg by mouth daily.    . VENCLEXTA 100 MG TABS      No current facility-administered medications for this visit.    VITAL SIGNS: There were no vitals taken for this visit. There were no vitals filed for this visit.  Estimated body mass index is 21.17 kg/m as calculated from the following:   Height as of 05/29/20: 6' (1.829 m).   Weight as of 06/04/20: 156 lb 1.4 oz (70.8 kg).  LABS: CBC:    Component Value  Date/Time   WBC 6.7 06/08/2020 0824   HGB 9.9 (L) 06/08/2020 0824   HGB 19.3 (H) 08/24/2012 1632   HCT 30.0 (L) 06/08/2020 0824   HCT 59.8 (H) 08/24/2012 1632   PLT 153 06/08/2020 0824   PLT 551 (H) 08/24/2012 1632   MCV 85.7 06/08/2020 0824   MCV 84 08/24/2012 1632   NEUTROABS PENDING 06/08/2020 0824   LYMPHSABS PENDING 06/08/2020 0824   MONOABS PENDING 06/08/2020 0824   EOSABS PENDING 06/08/2020 0824   BASOSABS PENDING 06/08/2020 0824   Comprehensive Metabolic Panel:    Component Value Date/Time   NA 131 (L) 06/08/2020 0824   NA 137 08/24/2012 1632   K 3.7 06/08/2020 0824   K 4.2 08/24/2012 1632   CL 99 06/08/2020 0824   CL 108 (  H) 08/24/2012 1632   CO2 24 06/08/2020 0824   CO2 21 08/24/2012 1632   BUN 21 06/08/2020 0824   BUN 18 08/24/2012 1632   CREATININE 1.37 (H) 06/08/2020 0824   CREATININE 0.97 08/24/2012 1632   GLUCOSE 90 06/08/2020 0824   GLUCOSE 113 (H) 08/24/2012 1632   CALCIUM 8.5 (L) 06/08/2020 0824   CALCIUM 9.6 08/24/2012 1632   AST 27 06/04/2020 1006   AST 42 (H) 08/24/2012 1632   ALT 11 06/04/2020 1006   ALT 41 08/24/2012 1632   ALKPHOS 66 06/04/2020 1006   ALKPHOS 155 (H) 08/24/2012 1632   BILITOT 1.0 06/04/2020 1006   BILITOT 0.9 08/24/2012 1632   PROT 9.1 (H) 06/04/2020 1006   PROT 8.6 (H) 08/24/2012 1632   ALBUMIN 2.6 (L) 06/04/2020 1006   ALBUMIN 4.1 08/24/2012 1632    RADIOGRAPHIC STUDIES: IR Fluoro Guide CV Line Left  Result Date: 05/18/2020 CLINICAL DATA:  AML, needs venous access for treatment regimen. Renal insufficiency. EXAM: TUNNELED CENTRAL VENOUS CATHETER PLACEMENT WITH ULTRASOUND AND FLUOROSCOPIC GUIDANCE TECHNIQUE: The procedure, risks, benefits, and alternatives were explained to the patient. Questions regarding the procedure were encouraged and answered. The patient understands and consents to the procedure. Patency of the right IJ vein was confirmed with ultrasound with image documentation. An appropriate skin site was  determined. Region was prepped using maximum barrier technique including cap and mask, sterile gown, sterile gloves, large sterile sheet, and Chlorhexidine as cutaneous antisepsis. The region was infiltrated locally with 1% lidocaine. Under real-time ultrasound guidance, the right IJ vein was accessed with a 21 gauge micropuncture needle; the needle tip within the vein was confirmed with ultrasound image documentation. 46F single-lumen cuffed PowerLine tunneled from a right anterior chest wall approach to the dermatotomy site. Needle exchanged over the 018 guidewire for transitional dilator, through which the catheter which had been cut to 22 cm was advanced under intermittent fluoroscopy, positioned with its tip at the cavoatrial junction. Spot chest radiograph confirms good catheter position. No pneumothorax. Catheter was flushed per protocol. Catheter secured externally with O Prolene suture. The right IJ dermatotomy site was closed with Dermabond. COMPLICATIONS: COMPLICATIONS None immediate FLUOROSCOPY TIME:  Less than 0.1 minute; 0.25 mGy COMPARISON:  None IMPRESSION: 1. Technically successful placement of tunneled right IJ tunneled single-lumen power injectable catheter with ultrasound and fluoroscopic guidance. Ready for routine use. Electronically Signed   By: Lucrezia Europe M.D.   On: 05/18/2020 11:33    PERFORMANCE STATUS (ECOG) : 2 - Symptomatic, <50% confined to bed  Review of Systems Unless otherwise noted, a complete review of systems is negative.  Physical Exam General: NAD, thin Pulmonary: Unlabored Extremities: + edema, no joint deformities Skin: no rashes Neurological: Weakness but otherwise nonfocal  IMPRESSION: Follow-up visit with patient and wife. Patient reports he is doing reasonably well. He denies any significant changes or concerns. He does have chronic dry cough, which he finds irritating. He says that it happens sometimes when he eats and drinks. Talk about referral to speech  therapy but he declined. He would be interested in trying an antitussive. Will prescribe as needed benzonatate.   Discussed the MOST form. Patient/wife want to hold on addressing until after the bone marrow biopsy.  PLAN: -Continue current scope of treatment -Benzonatate 100 mg twice daily as needed for cough -MOST form previously reviewed -RTC next week  Case and plan discussed with Dr. Mike Gip  Patient expressed understanding and was in agreement with this plan. He also understands that  He can call the clinic at any time with any questions, concerns, or complaints.     Time Total: 25 minutes  Visit consisted of counseling and education dealing with the complex and emotionally intense issues of symptom management and palliative care in the setting of serious and potentially life-threatening illness.Greater than 50%  of this time was spent counseling and coordinating care related to the above assessment and plan.  Signed by: Altha Harm, PhD, NP-C

## 2020-06-08 NOTE — Consult Note (Signed)
Pharmacy Antibiotic Note  Ruben [name] is a 74 y.o. male admitted on 06/08/2020 with sepsis- unknown source.  Pharmacy has been consulted for Vancomycin/ Cefepime dosing. Patient has a PMH significant for CKD III.  Vancomycin 1750 mg given @2108 . Cefepime 2g @ 1918.   Plan: 1. Cefepime 2g Q12H - will start tomorrow at 0700.  2. Vancomycin 750 mg Q12H - Will start tomorrow at 1000. Will check Scr with AM labs.   Height: 6' (182.9 cm) Weight: 70.8 kg (156 lb 1.4 oz) IBW/kg (Calculated) : 77.6  Temp (24hrs), Avg:99.6 F (37.6 C), Min:98.8 F (37.1 C), Max:100.9 F (38.3 C)  Recent Labs  Lab 06/04/20 1006 06/06/20 0821 06/08/20 0824 06/08/20 1456 06/08/20 1514  WBC 3.6* 4.4 6.7  --   --   CREATININE 1.35*  --  1.37* 1.41*  --   LATICACIDVEN  --   --   --   --  2.2*    Estimated Creatinine Clearance: 46.7 mL/min (A) (by C-G formula based on SCr of 1.41 mg/dL (H)).    No Known Allergies  Antimicrobials this admission: 9/17 Cefepime/Vancomycin/ Metronidazole   Dose adjustments this admission:   Microbiology results:  BCx:   UCx:    Sputum:   MRSA PCR:   Thank you for allowing pharmacy to be a part of this patient's care.  Rowland Lathe 06/08/2020 9:45 PM

## 2020-06-08 NOTE — Progress Notes (Signed)
NP Rulon Abide telephoned report to Triage Nurse in the ED. RN pushed pt via wheelchair to ED. RN gave a brief report of what was done in the cancer center for patient this afternoon to an RN in ED. Pt has AML. CXR was done. EKG performed. Labs obtained BMP, procalcitonin which is elevated at 5.82. Critical lactic acid is 2.2. Pt has a R chest single lumen subclavian line. He received 1 liter of NS over 1 hour. Temp 99- 100.9 in cancer center. HR was 126 on arrival. 114 at present. Wife is with patient in the ED. He was being placed in a separate/ private waiting area in the ED.

## 2020-06-08 NOTE — Progress Notes (Signed)
3:50pm  Temp: 100.2 Blood pressure: 147/81 Pulse:115 Oz:95

## 2020-06-09 ENCOUNTER — Inpatient Hospital Stay: Payer: Medicare Other

## 2020-06-09 ENCOUNTER — Other Ambulatory Visit: Payer: Self-pay | Admitting: Oncology

## 2020-06-09 ENCOUNTER — Other Ambulatory Visit: Payer: Self-pay

## 2020-06-09 DIAGNOSIS — R627 Adult failure to thrive: Secondary | ICD-10-CM | POA: Diagnosis not present

## 2020-06-09 DIAGNOSIS — M6281 Muscle weakness (generalized): Secondary | ICD-10-CM | POA: Diagnosis not present

## 2020-06-09 DIAGNOSIS — R634 Abnormal weight loss: Secondary | ICD-10-CM | POA: Diagnosis not present

## 2020-06-09 DIAGNOSIS — N400 Enlarged prostate without lower urinary tract symptoms: Secondary | ICD-10-CM | POA: Diagnosis not present

## 2020-06-09 DIAGNOSIS — E871 Hypo-osmolality and hyponatremia: Secondary | ICD-10-CM | POA: Diagnosis present

## 2020-06-09 DIAGNOSIS — Z9221 Personal history of antineoplastic chemotherapy: Secondary | ICD-10-CM | POA: Diagnosis not present

## 2020-06-09 DIAGNOSIS — K5090 Crohn's disease, unspecified, without complications: Secondary | ICD-10-CM | POA: Diagnosis present

## 2020-06-09 DIAGNOSIS — M109 Gout, unspecified: Secondary | ICD-10-CM | POA: Diagnosis not present

## 2020-06-09 DIAGNOSIS — R5381 Other malaise: Secondary | ICD-10-CM | POA: Diagnosis not present

## 2020-06-09 DIAGNOSIS — R509 Fever, unspecified: Secondary | ICD-10-CM | POA: Diagnosis not present

## 2020-06-09 DIAGNOSIS — I517 Cardiomegaly: Secondary | ICD-10-CM | POA: Diagnosis not present

## 2020-06-09 DIAGNOSIS — J189 Pneumonia, unspecified organism: Secondary | ICD-10-CM | POA: Diagnosis present

## 2020-06-09 DIAGNOSIS — J811 Chronic pulmonary edema: Secondary | ICD-10-CM | POA: Diagnosis not present

## 2020-06-09 DIAGNOSIS — N401 Enlarged prostate with lower urinary tract symptoms: Secondary | ICD-10-CM | POA: Diagnosis present

## 2020-06-09 DIAGNOSIS — M79662 Pain in left lower leg: Secondary | ICD-10-CM | POA: Diagnosis not present

## 2020-06-09 DIAGNOSIS — R000 Tachycardia, unspecified: Secondary | ICD-10-CM | POA: Diagnosis present

## 2020-06-09 DIAGNOSIS — Z20822 Contact with and (suspected) exposure to covid-19: Secondary | ICD-10-CM | POA: Diagnosis present

## 2020-06-09 DIAGNOSIS — R6510 Systemic inflammatory response syndrome (SIRS) of non-infectious origin without acute organ dysfunction: Secondary | ICD-10-CM | POA: Diagnosis not present

## 2020-06-09 DIAGNOSIS — Z960 Presence of urogenital implants: Secondary | ICD-10-CM | POA: Diagnosis present

## 2020-06-09 DIAGNOSIS — J90 Pleural effusion, not elsewhere classified: Secondary | ICD-10-CM | POA: Diagnosis present

## 2020-06-09 DIAGNOSIS — Z6821 Body mass index (BMI) 21.0-21.9, adult: Secondary | ICD-10-CM | POA: Diagnosis not present

## 2020-06-09 DIAGNOSIS — N179 Acute kidney failure, unspecified: Secondary | ICD-10-CM | POA: Diagnosis not present

## 2020-06-09 DIAGNOSIS — Z66 Do not resuscitate: Secondary | ICD-10-CM | POA: Diagnosis present

## 2020-06-09 DIAGNOSIS — M79661 Pain in right lower leg: Secondary | ICD-10-CM | POA: Diagnosis not present

## 2020-06-09 DIAGNOSIS — D499 Neoplasm of unspecified behavior of unspecified site: Secondary | ICD-10-CM | POA: Diagnosis not present

## 2020-06-09 DIAGNOSIS — A419 Sepsis, unspecified organism: Secondary | ICD-10-CM | POA: Diagnosis not present

## 2020-06-09 DIAGNOSIS — R5081 Fever presenting with conditions classified elsewhere: Secondary | ICD-10-CM | POA: Diagnosis present

## 2020-06-09 DIAGNOSIS — E43 Unspecified severe protein-calorie malnutrition: Secondary | ICD-10-CM | POA: Diagnosis present

## 2020-06-09 DIAGNOSIS — R338 Other retention of urine: Secondary | ICD-10-CM | POA: Diagnosis present

## 2020-06-09 DIAGNOSIS — J439 Emphysema, unspecified: Secondary | ICD-10-CM | POA: Diagnosis not present

## 2020-06-09 DIAGNOSIS — C9200 Acute myeloblastic leukemia, not having achieved remission: Secondary | ICD-10-CM | POA: Diagnosis present

## 2020-06-09 DIAGNOSIS — R0602 Shortness of breath: Secondary | ICD-10-CM | POA: Diagnosis not present

## 2020-06-09 DIAGNOSIS — Z6820 Body mass index (BMI) 20.0-20.9, adult: Secondary | ICD-10-CM | POA: Diagnosis not present

## 2020-06-09 DIAGNOSIS — R279 Unspecified lack of coordination: Secondary | ICD-10-CM | POA: Diagnosis not present

## 2020-06-09 DIAGNOSIS — J9601 Acute respiratory failure with hypoxia: Secondary | ICD-10-CM | POA: Diagnosis not present

## 2020-06-09 DIAGNOSIS — I4510 Unspecified right bundle-branch block: Secondary | ICD-10-CM | POA: Diagnosis not present

## 2020-06-09 DIAGNOSIS — R54 Age-related physical debility: Secondary | ICD-10-CM | POA: Diagnosis present

## 2020-06-09 DIAGNOSIS — D509 Iron deficiency anemia, unspecified: Secondary | ICD-10-CM | POA: Diagnosis present

## 2020-06-09 DIAGNOSIS — D849 Immunodeficiency, unspecified: Secondary | ICD-10-CM | POA: Diagnosis present

## 2020-06-09 DIAGNOSIS — D7581 Myelofibrosis: Secondary | ICD-10-CM | POA: Diagnosis present

## 2020-06-09 DIAGNOSIS — Z515 Encounter for palliative care: Secondary | ICD-10-CM | POA: Diagnosis not present

## 2020-06-09 DIAGNOSIS — N1832 Chronic kidney disease, stage 3b: Secondary | ICD-10-CM | POA: Diagnosis present

## 2020-06-09 DIAGNOSIS — M79605 Pain in left leg: Secondary | ICD-10-CM | POA: Diagnosis not present

## 2020-06-09 DIAGNOSIS — D45 Polycythemia vera: Secondary | ICD-10-CM | POA: Diagnosis not present

## 2020-06-09 DIAGNOSIS — E8770 Fluid overload, unspecified: Secondary | ICD-10-CM | POA: Diagnosis present

## 2020-06-09 DIAGNOSIS — D4989 Neoplasm of unspecified behavior of other specified sites: Secondary | ICD-10-CM | POA: Diagnosis not present

## 2020-06-09 DIAGNOSIS — D649 Anemia, unspecified: Secondary | ICD-10-CM | POA: Diagnosis not present

## 2020-06-09 DIAGNOSIS — H409 Unspecified glaucoma: Secondary | ICD-10-CM | POA: Diagnosis not present

## 2020-06-09 DIAGNOSIS — M79604 Pain in right leg: Secondary | ICD-10-CM | POA: Diagnosis not present

## 2020-06-09 LAB — BLOOD GAS, ARTERIAL
Acid-base deficit: 4.1 mmol/L — ABNORMAL HIGH (ref 0.0–2.0)
Allens test (pass/fail): POSITIVE — AB
Bicarbonate: 18.5 mmol/L — ABNORMAL LOW (ref 20.0–28.0)
FIO2: 0.36
O2 Saturation: 93 %
Patient temperature: 37
pCO2 arterial: 26 mmHg — ABNORMAL LOW (ref 32.0–48.0)
pH, Arterial: 7.46 — ABNORMAL HIGH (ref 7.350–7.450)
pO2, Arterial: 63 mmHg — ABNORMAL LOW (ref 83.0–108.0)

## 2020-06-09 LAB — CBC
HCT: 25.9 % — ABNORMAL LOW (ref 39.0–52.0)
Hemoglobin: 8.8 g/dL — ABNORMAL LOW (ref 13.0–17.0)
MCH: 28.8 pg (ref 26.0–34.0)
MCHC: 34 g/dL (ref 30.0–36.0)
MCV: 84.6 fL (ref 80.0–100.0)
Platelets: 159 10*3/uL (ref 150–400)
RBC: 3.06 MIL/uL — ABNORMAL LOW (ref 4.22–5.81)
RDW: 18.8 % — ABNORMAL HIGH (ref 11.5–15.5)
WBC: 5.6 10*3/uL (ref 4.0–10.5)
nRBC: 4.6 % — ABNORMAL HIGH (ref 0.0–0.2)

## 2020-06-09 LAB — BASIC METABOLIC PANEL
Anion gap: 9 (ref 5–15)
BUN: 24 mg/dL — ABNORMAL HIGH (ref 8–23)
CO2: 23 mmol/L (ref 22–32)
Calcium: 8.3 mg/dL — ABNORMAL LOW (ref 8.9–10.3)
Chloride: 98 mmol/L (ref 98–111)
Creatinine, Ser: 1.29 mg/dL — ABNORMAL HIGH (ref 0.61–1.24)
GFR calc Af Amer: >60 mL/min (ref >60)
GFR calc non Af Amer: 55 mL/min — ABNORMAL LOW (ref >60)
Glucose, Bld: 84 mg/dL (ref 70–99)
Potassium: 3.3 mmol/L — ABNORMAL LOW (ref 3.5–5.1)
Sodium: 130 mmol/L — ABNORMAL LOW (ref 135–145)

## 2020-06-09 LAB — PROTIME-INR
INR: 1.7 — ABNORMAL HIGH (ref 0.8–1.2)
Prothrombin Time: 19.2 seconds — ABNORMAL HIGH (ref 11.4–15.2)

## 2020-06-09 LAB — BRAIN NATRIURETIC PEPTIDE
B Natriuretic Peptide: 819 pg/mL — ABNORMAL HIGH (ref 0.0–100.0)
B Natriuretic Peptide: 494.2 pg/mL — ABNORMAL HIGH (ref 0.0–100.0)

## 2020-06-09 LAB — MAGNESIUM: Magnesium: 1.5 mg/dL — ABNORMAL LOW (ref 1.7–2.4)

## 2020-06-09 LAB — LACTIC ACID, PLASMA
Lactic Acid, Venous: 1.1 mmol/L (ref 0.5–1.9)
Lactic Acid, Venous: 3.2 mmol/L (ref 0.5–1.9)
Lactic Acid, Venous: 1.5 mmol/L (ref 0.5–1.9)

## 2020-06-09 LAB — PROCALCITONIN: Procalcitonin: 34.65 ng/mL

## 2020-06-09 MED ORDER — FUROSEMIDE 10 MG/ML IJ SOLN
40.0000 mg | Freq: Once | INTRAMUSCULAR | Status: AC
  Administered 2020-06-09: 40 mg via INTRAVENOUS
  Filled 2020-06-09: qty 4

## 2020-06-09 MED ORDER — POTASSIUM CHLORIDE CRYS ER 20 MEQ PO TBCR
40.0000 meq | EXTENDED_RELEASE_TABLET | Freq: Once | ORAL | Status: AC
  Administered 2020-06-09: 40 meq via ORAL
  Filled 2020-06-09: qty 2

## 2020-06-09 MED ORDER — LORAZEPAM 2 MG/ML IJ SOLN
0.5000 mg | Freq: Four times a day (QID) | INTRAMUSCULAR | Status: DC | PRN
  Administered 2020-06-09 – 2020-06-10 (×2): 0.5 mg via INTRAVENOUS
  Filled 2020-06-09 (×2): qty 1

## 2020-06-09 MED ORDER — LACTATED RINGERS IV SOLN: INTRAVENOUS | Status: DC

## 2020-06-09 MED ORDER — HYDRALAZINE HCL 20 MG/ML IJ SOLN: 10.0000 mg | INTRAMUSCULAR | Status: DC | PRN

## 2020-06-09 MED ORDER — LORAZEPAM 2 MG/ML IJ SOLN: 0.5000 mg | Freq: Once | INTRAMUSCULAR | Status: DC

## 2020-06-09 MED ORDER — VANCOMYCIN HCL 750 MG/150ML IV SOLN
750.0000 mg | Freq: Two times a day (BID) | INTRAVENOUS | Status: DC
  Administered 2020-06-09 – 2020-06-10 (×3): 750 mg via INTRAVENOUS
  Filled 2020-06-09 (×7): qty 150

## 2020-06-09 MED ORDER — AMLODIPINE BESYLATE 5 MG PO TABS
5.0000 mg | ORAL_TABLET | Freq: Every day | ORAL | Status: DC
  Administered 2020-06-10 – 2020-06-14 (×5): 5 mg via ORAL
  Filled 2020-06-09 (×5): qty 1

## 2020-06-09 NOTE — ED Notes (Signed)
Xray in room getting chest xray.

## 2020-06-09 NOTE — ED Notes (Signed)
Wife at bedside.

## 2020-06-09 NOTE — ED Notes (Signed)
Pt was ambulatory to BR for self cath.  Upon returning to room, pt c/o being cold and shaky.  Pt also c/o having difficulty breathing.  Pt noted to be tachypneic and tachycardic.  O2 sats 98%.  Pt placed back in bed, encouraged to practice deep breathing.  Room temperature adjusted, pt provided warm blanket, will monitor.

## 2020-06-09 NOTE — Progress Notes (Signed)
Pt. Unable to tolerate bipap. Placed pt. Back on 4 lnc.

## 2020-06-09 NOTE — ED Notes (Signed)
VANCOMYCIN ADMINISTERED AT 2150. UNABLE TO CHART IN MAR DUE TO IN BEING LOCKED BY ANOTHER USER.

## 2020-06-09 NOTE — ED Notes (Signed)
Pt up to hall bathroom to self-cath.

## 2020-06-09 NOTE — ED Notes (Signed)
Pt had improved mildly, called back to room with sudden onset SHOB, pt remains tachypneic, tachycardiac.  Pt appears in distress.  Pt placed on 2L . Dr. Jamesetta Geralds notified.

## 2020-06-09 NOTE — ED Notes (Signed)
PT resting more quietly at this time.  Family at bedside.  Attempted BiPap, pt did not tolerate mask, became increasingly tachypneic with same.  Decision made to allow pt to rest on [place] at this time and re-evaluate if pt becomes unstable again.  Will continue to monitor closely.

## 2020-06-09 NOTE — Evaluation (Signed)
Occupational Therapy Evaluation Patient Details Name: Ruben [name] MRN: [id number] DOB: 03/29/1946 Today's Date: 06/09/2020    History of Present Illness Per MD notes: Ruben [name] is a 74 y.o. 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 Adm for SIRS.   Clinical Impression   Pt seem for OT evaluation this date in setting of hospitalization d/t abnormal labs at oncology office. Pt is pleasant and happy to get OOB and move. Pt reports he lives with his spouse in a [facility] with 4-5 steps to enter with b/l railing and is INDEP with self care and fxl mobility at baseline. Pt does c/o of occasional knee pain that hinders his fxl mobility tolerance/standing tolerance at home, but states it is not that severe this date. On assessment, pt able to perform all self care at his reported baseline and performs transfers/fxl mobility with no AD and no physical assist. Does appear to summon more effort than his reported baseline d/t generally feeling weaker although he also reports that he overall feels better this AM. Pt's UB strength grossly 4/5, OT issues him UB HEP with green resistance band and he has good understanding. Do not anticipate continued skilled OT needs in acute setting, nor need for OT f/u upon d/c. Pt would likely benefit from mobility specialist consult while in acute setting. Thank you for the consult on this lovely patient.    Follow Up Recommendations  No OT follow up    Equipment Recommendations  None recommended by OT    Recommendations for Other Services       Precautions / Restrictions Precautions Precautions:  Fall Precaution Comments: low fall Restrictions Weight Bearing Restrictions: No      Mobility Bed Mobility Overal bed mobility: Independent                Transfers Overall transfer level: Independent               General transfer comment: demos good control, safety awareness, stability with coming to stand and static/dynamic standing.    Balance Overall balance assessment: Independent                                         ADL either performed or assessed with clinical judgement   ADL Overall ADL's : Modified independent                                       General ADL Comments: requires increased time/demos less fxl activity tolerance as it pertains to standing ADLs/fxl mobility, but requires no physical assistance or AD.     Vision Baseline Vision/History: Wears glasses Wears Glasses: Reading only Patient Visual Report: No change from baseline       Perception     Praxis      Pertinent Vitals/Pain Pain Assessment: Faces Faces Pain Scale: Hurts a little bit Pain Location: b/l knees (pt reports pretty good today, but does endorse that the pain is sometimes worse and limiting his standing/ambulation tolerance) Pain Descriptors / Indicators:  Sore Pain Intervention(s): Monitored during session     Hand Dominance Right   Extremity/Trunk Assessment Upper Extremity Assessment Upper Extremity Assessment: Overall WFL for tasks assessed;Generalized weakness (ROM WFL, strength grossly 4/5)   Lower Extremity Assessment Lower Extremity Assessment: Defer to PT evaluation;Overall WFL for tasks assessed       Communication Communication Communication: No difficulties   Cognition Arousal/Alertness: Awake/alert Behavior During Therapy: WFL for tasks assessed/performed Overall Cognitive Status: Within Functional Limits for tasks assessed                                     General Comments        Exercises Other Exercises Other Exercises: OT issues pt UB HEP with green resistance band, goes over proper form/technique. Pt with good understanding.   Shoulder Instructions      Home Living Family/patient expects to be discharged to:: Private residence Living Arrangements: Spouse/significant other Available Help at Discharge: Family;Available 24 hours/day Type of Home: House Home Access: Stairs to enter [employer] of Steps: 4-5 Entrance Stairs-Rails: Right;Left;Can reach both Home Layout: One level     Bathroom Shower/Tub: Occupational psychologist: Standard     Home Equipment: Shower seat;Grab bars - tub/shower          Prior Functioning/Environment Level of Independence: Independent        Comments: Pt reports total INDEP with ADLs/fxl mobility. He does not use AD for fxl mobility, no use of DME for ADLs. Has shower seat but stands to shower. States he has not been able to drive since initiating chemo d/t doctor's orders. His spouse drives him to appts and gets groceries.        OT Problem List: Decreased strength;Decreased activity tolerance      OT Treatment/Interventions: Self-care/ADL training;Therapeutic exercise;Therapeutic activities    OT Goals(Current goals can be found in the care plan section) Acute Rehab OT Goals Patient Stated Goal: to go home OT Goal Formulation: All assessment and education complete, DC therapy  OT Frequency:     Barriers to D/C:            Co-evaluation              AM-PAC OT "6 Clicks" Daily Activity     Outcome Measure Help from another person eating meals?: None Help from another person taking care of personal grooming?: None Help from another person toileting, which includes using toliet, bedpan, or urinal?: None Help from another person bathing (including washing, rinsing, drying)?: None Help from another person to put on and taking off regular upper body clothing?: None Help from another  person to put on and taking off regular lower body clothing?: None 6 Click Score: 24   End of Session Equipment Utilized During Treatment: Gait belt Nurse Communication: Mobility status  Activity Tolerance: Patient tolerated treatment well Patient left: Other (comment) (sitting edge of stretcher with breakfast tray on BST in front of him. Balance is normal, RN okay'ed.)  OT Visit Diagnosis: Muscle weakness (generalized) (M62.81)                Time: 5170-0174 OT Time Calculation (min): 39 min Charges:  OT General Charges $OT Visit: 1 Visit OT Evaluation $OT Eval Moderate Complexity: 1 Mod OT Treatments $Self Care/Home Management : 8-22 mins $Therapeutic Activity: 8-22 mins  Gerrianne Scale, MS, OTR/L ascom [phone] 06/09/20, 9:57 AM

## 2020-06-09 NOTE — ED Notes (Signed)
Pt back into bed from bathroom at this time.

## 2020-06-09 NOTE — Consult Note (Signed)
[place]  [phone] [phone]) [phone]  ID: Ruben [name] OB: [birth year]  MR#: [id number]  GBM#:[id number]  Patient Care Team: [name], MD as PCP - General (Family Medicine) [name], MD (Inactive) (Gastroenterology) Dingeldein, Remo Lipps, MD (Ophthalmology) Lequita Asal, MD as Referring Physician (Hematology and Oncology) Dasher, Rayvon Char, MD as Referring Physician (Dermatology) Anthonette Legato, MD as Consulting Physician (Internal Medicine) Hollice Espy, MD as Consulting Physician (Urology) Crissie Sickles, MD as Referring Physician (Hematology and Oncology) Vladimir Crofts, MD as Consulting Physician (Neurology)  CHIEF COMPLAINT: AML, 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 He has been on chemotherapy since that time. He last received by Loreen Freud on May 31, 2020. He continues to take oral venetoclax his last dose was yesterday. Patient was seen in clinic Friday morning and felt at his baseline, then acutely throughout the day became progressively more weak and fatigued with low-grade fevers. He was brought back to the cancer center and subsequent transferred to the ED for admission. Infectious work-up is negative to date. Patient was previously confused, but this has resolved. His no neurologic complaints. He has no chest pain, cough, or hemoptysis. He continues to have shortness of breath, but oxygen saturations are 98% on 2 L nasal cannula. He has a poor appetite. He has no nausea, vomiting, constipation, or diarrhea. He has no urinary complaints. Patient feels generally terrible, but offers no further specific complaints.  REVIEW OF SYSTEMS:   Review of Systems  Constitutional: Positive for fever and malaise/fatigue. Negative for weight loss.  Respiratory: Positive for shortness of breath. Negative for cough and  hemoptysis.   Cardiovascular: Negative.  Negative for chest pain and leg swelling.  Gastrointestinal: Negative.  Negative for abdominal pain and diarrhea.  Genitourinary: Negative.  Negative for dysuria.  Musculoskeletal: Negative.  Negative for back pain.  Skin: Negative.  Negative for rash.  Neurological: Positive for weakness. Negative for dizziness, focal weakness and headaches.  Psychiatric/Behavioral: Negative.  The patient is not nervous/anxious.     As per HPI. Otherwise, a complete review of systems is negative.  PAST MEDICAL HISTORY: Past Medical History:  Diagnosis Date  . Blood dyscrasia   . BPH (benign prostatic hyperplasia)   . Cancer ([place])    SKIN/ POLYCYTHEMIA VERA  . Chronic kidney disease    RENAL INSUFF (40%)  . Crohn disease ([place])   . Crohn's disease ([place])   . Glaucoma   . Glaucoma   . Gout   . History of chicken pox   . Myelofibrosis ([place])   . Nosebleed   . RBBB   . Right bundle branch block     PAST SURGICAL HISTORY: Past Surgical History:  Procedure Laterality Date  . CATARACT EXTRACTION W/PHACO Left 07/13/2018   Procedure: CATARACT EXTRACTION PHACO AND INTRAOCULAR LENS PLACEMENT (IOC);  Surgeon: [name], MD;  Location: [facility] ORS;  Service: Ophthalmology;  Laterality: Left;  [place] 00:39  CDE 5.14 Fluid pack lot # [id number]  . CATARACT EXTRACTION W/PHACO Right 08/03/2018   Procedure: CATARACT EXTRACTION PHACO AND INTRAOCULAR LENS PLACEMENT (IOC);  Surgeon: [name], MD;  Location: [facility] ORS;  Service: Ophthalmology;  Laterality: Right;  [place] 00:43.4 CDE 5.23 Fluid pack Lot # [id number]  . COLONOSCOPY WITH PROPOFOL N/A 03/27/2017   Procedure: COLONOSCOPY WITH PROPOFOL;  Surgeon: [name], MD;  Location: [facility] ENDOSCOPY;  Service: Endoscopy;  Laterality: N/A;  . IR FLUORO GUIDE CV LINE LEFT  05/18/2020  . MOHS SURGERY     EAR  . PROSTATE SURGERY  2008   Prostate Biopsy in [place] to elevated PSA.  per patient normal  . Skin  Lesion Basal cell removed    . wart removal     from eyelid    FAMILY HISTORY: Family History  Problem Relation Age of Onset  . Cancer Mother        Liver  . Cancer Father        Prostate  . Cancer Sister        Hodgkins lymphoma  . Cancer Paternal Aunt        Breast    ADVANCED DIRECTIVES (Y/N):  _0 @  HEALTH MAINTENANCE: Social History   Tobacco Use  . Smoking status: Former Smoker    Packs/day: 0.25    Years: 20.00    Pack years: 5.00    Types: Cigarettes    Quit date: 09/28/2003    Years since quitting: 16.7  . Smokeless tobacco: Never Used  Vaping Use  . Vaping Use: Never used  Substance Use Topics  . Alcohol use: Not Currently    Alcohol/week: 4.0 - 5.0 standard drinks    Types: 4 - 5 Glasses of wine per week  . Drug use: No     Colonoscopy:  PAP:  Bone density:  Lipid panel:  No Known Allergies  Current Facility-Administered Medications  Medication Dose Route Frequency Provider Last Rate Last Admin  . acetaminophen (TYLENOL) tablet 650 mg  650 mg Oral Q6H PRN Lenore Cordia, MD   650 mg at 06/09/20 1555   Or  . acetaminophen (TYLENOL) suppository 650 mg  650 mg Rectal Q6H PRN Lenore Cordia, MD      . allopurinol (ZYLOPRIM) tablet 150 mg  150 mg Oral Daily Zada Finders R, MD   150 mg at 06/09/20 1013  . amLODipine (NORVASC) tablet 5 mg  5 mg Oral Daily Shahmehdi, Seyed A, MD      . benzonatate (TESSALON) capsule 100 mg  100 mg Oral BID PRN Zada Finders R, MD      . ceFEPIme (MAXIPIME) 2 g in sodium chloride 0.9 % 100 mL IVPB  2 g Intravenous Q12H Rowland Lathe, RPH   Stopped at 06/09/20 4270  . folic acid (FOLVITE) tablet 1 mg  1 mg Oral Q1200 Zada Finders R, MD      . hydrALAZINE (APRESOLINE) injection 10 mg  10 mg Intravenous Q4H PRN Shahmehdi, Seyed A, MD      . latanoprost (XALATAN) 0.005 % ophthalmic solution 1 drop  1 drop Both Eyes QHS Patel, Vishal R, MD      . LORazepam (ATIVAN) injection 0.5 mg  0.5 mg Intravenous Q6H PRN  Shahmehdi, Seyed A, MD   0.5 mg at 06/09/20 1510  . metroNIDAZOLE (FLAGYL) IVPB 500 mg  500 mg Intravenous Q8H Lenore Cordia, MD   Stopped at 06/09/20 0727  . ondansetron (ZOFRAN) tablet 4 mg  4 mg Oral Q6H PRN Lenore Cordia, MD       Or  . ondansetron (ZOFRAN) injection 4 mg  4 mg Intravenous Q6H PRN Zada Finders R, MD      . timolol (TIMOPTIC) 0.5 % ophthalmic solution 1 drop  1 drop Both Eyes Daily Lenore Cordia, MD   1 drop at 06/09/20 1012  . valACYclovir (VALTREX) tablet 500 mg  500 mg Oral Daily  Lenore Cordia, MD   500 mg at 06/09/20 0857  . vancomycin (VANCOREADY) IVPB 750 mg/150 mL  750 mg Intravenous Q12H Rowland Lathe, RPH   Stopped at 06/09/20 1202  . vitamin B-12 (CYANOCOBALAMIN) tablet 1,000 mcg  1,000 mcg Oral Q1200 Lenore Cordia, MD   1,000 mcg at 06/09/20 1013   Current Outpatient Medications  Medication Sig Dispense Refill  . allopurinol (ZYLOPRIM) 300 MG tablet TAKE 1/2 TABLET (150 MG TOTAL) BY MOUTH DAILY. 45 tablet 1  . folic acid (FOLVITE) 1 MG tablet Take 1 mg by mouth daily at 12 noon.     Marland Kitchen levofloxacin (LEVAQUIN) 500 MG tablet Take 250 mg by mouth daily.     . midodrine (PROAMATINE) 5 MG tablet Take 1 tablet (5 mg total) by mouth 3 (three) times daily with meals. 90 tablet 0  . ondansetron (ZOFRAN-ODT) 4 MG disintegrating tablet Take 1 tablet (4 mg total) by mouth every 8 (eight) hours as needed for nausea or vomiting. 45 tablet 0  . posaconazole (NOXAFIL) 100 MG TBEC delayed-release tablet Take 300 mg by mouth daily.     . potassium chloride SA (KLOR-CON M20) 20 MEQ tablet Take 1 tablet (20 mEq total) by mouth every other day. Patient taking every other day 30 tablet 0  . sodium bicarbonate 650 MG tablet Take 650 mg by mouth 2 (two) times daily.    . timolol (TIMOPTIC) 0.5 % ophthalmic solution Place 1 drop into both eyes daily. In AM  5  . valACYclovir (VALTREX) 500 MG tablet Take 500 mg by mouth daily.    . VENCLEXTA 100 MG TABS Take 100 mg by mouth  daily.     Marland Kitchen acetaminophen (TYLENOL) 500 MG tablet Take 500 mg by mouth daily as needed for moderate pain. (Patient not taking: Reported on 06/04/2020)    . benzonatate (TESSALON) 100 MG capsule Take 1 capsule (100 mg total) by mouth 2 (two) times daily as needed for cough. (Patient not taking: Reported on 06/09/2020) 20 capsule 0  . Cyanocobalamin (RA VITAMIN B-12 TR) 1000 MCG TBCR Take 1,000 mcg by mouth daily at 12 noon.     . cyanocobalamin 1000 MCG tablet Take by mouth.     . finasteride (PROSCAR) 5 MG tablet Take 1 tablet (5 mg total) by mouth every evening. 90 tablet 3  . latanoprost (XALATAN) 0.005 % ophthalmic solution Place 1 drop into both eyes at bedtime.       OBJECTIVE: Vitals:   06/09/20 1530 06/09/20 1600  BP: (!) 123/96 (!) 144/76  Pulse: (!) 128 (!) 120  Resp: 16 (!) 29  Temp:    SpO2: 95% 98%     Body mass index is 21.17 kg/m.    ECOG FS:3 - Symptomatic, >50% confined to bed  General: Ill-appearing, no acute distress. Eyes: Pink conjunctiva, anicteric sclera. HEENT: Normocephalic, moist mucous membranes. Lungs: No audible wheezing or coughing. Heart: Regular rate and rhythm. Abdomen: Soft, nontender, no obvious distention. Musculoskeletal: No edema, cyanosis, or clubbing. Neuro: Alert, answering all questions appropriately. Cranial nerves grossly intact. Skin: No rashes or petechiae noted. Psych: Normal affect.  LAB RESULTS:  Lab Results  Component Value Date   NA 130 (L) 06/09/2020   K 3.3 (L) 06/09/2020   CL 98 06/09/2020   CO2 23 06/09/2020   GLUCOSE 84 06/09/2020   BUN 24 (H) 06/09/2020   CREATININE 1.29 (H) 06/09/2020   CALCIUM 8.3 (L) 06/09/2020   PROT 9.1 (H) 06/04/2020  ALBUMIN 2.6 (L) 06/04/2020   AST 27 06/04/2020   ALT 11 06/04/2020   ALKPHOS 66 06/04/2020   BILITOT 1.0 06/04/2020   GFRNONAA 55 (L) 06/09/2020   GFRAA >60 06/09/2020    Lab Results  Component Value Date   WBC 5.6 06/09/2020   NEUTROABS 3.8 06/08/2020   HGB 8.8 (L)  06/09/2020   HCT 25.9 (L) 06/09/2020   MCV 84.6 06/09/2020   PLT 159 06/09/2020     STUDIES: DG Chest 2 View  Result Date: 06/08/2020 CLINICAL DATA:  Chronic cough, shortness of breath EXAM: CHEST - 2 VIEW COMPARISON:  Prior chest x-ray 04/26/2020 FINDINGS: Tunneled right IJ central venous catheter present. The tip overlies the superior cavoatrial junction. Stable mild cardiomegaly. Mild pulmonary vascular congestion without overt edema. Significantly improved aeration compared to the prior radiograph. Small layering left pleural effusion with associated atelectasis. Stable chronic bronchitic changes and areas of linear scarring. No pneumothorax. No focal airspace infiltrate. IMPRESSION: 1. Overall, significantly improved aeration compared to prior imaging from 04/26/2020. 2. Stable cardiomegaly and pulmonary vascular congestion without overt edema. 3. Small left pleural effusion with associated atelectasis. 4. Tunneled right IJ central line in good position. Electronically Signed   By: Jacqulynn Cadet M.D.   On: 06/08/2020 14:04   US Venous Img Lower Bilateral (DVT)  Result Date: 06/09/2020 CLINICAL DATA:  Bilateral lower extremity pain 1 week. EXAM: BILATERAL LOWER EXTREMITY VENOUS DOPPLER ULTRASOUND TECHNIQUE: Gray-scale sonography with compression, as well as color and duplex ultrasound, were performed to evaluate the deep venous system(s) from the level of the common femoral vein through the popliteal and proximal calf veins. COMPARISON:  None. FINDINGS: VENOUS Normal compressibility of the common femoral, superficial femoral, and popliteal veins, as well as the visualized calf veins bilaterally. Visualized portions of profunda femoral vein and great saphenous vein unremarkable bilaterally. No filling defects to suggest DVT on grayscale or color Doppler imaging. Doppler waveforms show normal direction of venous flow, normal respiratory plasticity and response to augmentation bilaterally. OTHER  None. Limitations: none IMPRESSION: Negative bilateral lower extremity Doppler evaluation. Electronically Signed   By: Marin Olp M.D.   On: 06/09/2020 17:09   IR Fluoro Guide CV Line Left  Result Date: 05/18/2020 CLINICAL DATA:  AML, needs venous access for treatment regimen. Renal insufficiency. EXAM: TUNNELED CENTRAL VENOUS CATHETER PLACEMENT WITH ULTRASOUND AND FLUOROSCOPIC GUIDANCE TECHNIQUE: The procedure, risks, benefits, and alternatives were explained to the patient. Questions regarding the procedure were encouraged and answered. The patient understands and consents to the procedure. Patency of the right IJ vein was confirmed with ultrasound with image documentation. An appropriate skin site was determined. Region was prepped using maximum barrier technique including cap and mask, sterile gown, sterile gloves, large sterile sheet, and Chlorhexidine as cutaneous antisepsis. The region was infiltrated locally with 1% lidocaine. Under real-time ultrasound guidance, the right IJ vein was accessed with a 21 gauge micropuncture needle; the needle tip within the vein was confirmed with ultrasound image documentation. 46F single-lumen cuffed PowerLine tunneled from a right anterior chest wall approach to the dermatotomy site. Needle exchanged over the 018 guidewire for transitional dilator, through which the catheter which had been cut to 22 cm was advanced under intermittent fluoroscopy, positioned with its tip at the cavoatrial junction. Spot chest radiograph confirms good catheter position. No pneumothorax. Catheter was flushed per protocol. Catheter secured externally with O Prolene suture. The right IJ dermatotomy site was closed with Dermabond. COMPLICATIONS: COMPLICATIONS None immediate FLUOROSCOPY TIME:  Less than  0.1 minute; 0.25 mGy COMPARISON:  None IMPRESSION: 1. Technically successful placement of tunneled right IJ tunneled single-lumen power injectable catheter with ultrasound and fluoroscopic  guidance. Ready for routine use. Electronically Signed   By: Lucrezia Europe M.D.   On: 05/18/2020 11:33   DG Chest Port 1 View  Result Date: 06/09/2020 CLINICAL DATA:  Dyspnea, tachypnea, tachycardia EXAM: PORTABLE CHEST 1 VIEW COMPARISON:  06/08/2020 chest radiograph. FINDINGS: Right internal jugular central venous catheter terminates in the lower third of the SVC. Stable cardiomediastinal silhouette with mild cardiomegaly. No pneumothorax. Small left pleural effusion is unchanged. No significant right pleural effusion. Diffuse hazy parahilar interstitial lung opacities, increased. IMPRESSION: 1. Stable mild cardiomegaly. Diffuse hazy parahilar interstitial lung opacities, increased, favor pulmonary edema. 2. Stable small left pleural effusion. Electronically Signed   By: Ilona Sorrel M.D.   On: 06/09/2020 15:21    ASSESSMENT: AML, 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  PLAN:    1. AML: Patient last received subcutaneous Vidaza on May 30, 2020. He continued on 100 mg daily a venetoclax and his last dose was yesterday. Patient has been instructed to hold venetoclax until his acute symptoms resolved. He has a bone marrow biopsy scheduled for Wednesday, June 13, 2020 to assess his response to treatment. He has been instructed to keep this appointment as scheduled if he is discharged. Previously, hospice was discussed but patient wishes to see the results of his bone marrow biopsy before making any further decisions on whether to continue treatment. No other interventions are needed. Continue supportive care. Recommend palliative care consult with Josh Borders on Monday. Case discussed at length with family who is at bedside. 2. Fever with concern of sepsis: Infectious work-up negative to date. Chest x-ray mildly improved and blood cultures negative to date. Continue empiric antibiotics. 3. Anemia: Patient's most recent hemoglobin is 8.8. He does not require transfusion at this time. Continue to monitor daily  CBC. 4. Renal insufficiency: Patient's creatinine is mildly elevated at 1.29. Continue IV fluids as ordered. 5. Hypokalemia: Replace as needed.  Patient is DNR/DNI.  Appreciate consult, will follow.   Lloyd Huger, MD   06/09/2020 5:35 PM

## 2020-06-09 NOTE — ED Notes (Signed)
Emptied 919ms of urine out of catheter bag.

## 2020-06-09 NOTE — ED Notes (Signed)
Hospitalist speaking with wife. Wife back at bedside.

## 2020-06-09 NOTE — Progress Notes (Addendum)
PROGRESS NOTE    Patient: Ruben [name]                            PCP: [name], MD                    DOB: [birth year]            DOA: 06/08/2020 MEB:[id number]             DOS: 06/09/2020, 7:27 AM   LOS: 0 days   Date of Service: The patient was seen and examined on 06/09/2020  Subjective:   The patient was seen and examined this Am. Remained hemodynamically stable, afebrile normotensive this a.m., awake alert oriented No acute distress   Brief Narrative:   Ruben [name] is a 74 y.o. 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  ED Course:  Initial vitals showed BP 138/85, pulse 115, RR 18, temp 100.9 Fahrenheit, SPO2 96% on room air. WBC 6.7, creatinine 1.41, lactic acid 2.2, procalcitonin 5.82. UA -clear,  SARS-CoV-2 PCR is negative.   TB chest x-ray shows right IJ central line, cardiomegaly and pulmonary vascular congestion    Assessment & Plan:   Principal Problem:   SIRS (systemic inflammatory response syndrome) (HCC) Active Problems:   BPH (benign prostatic hyperplasia)   Crohn disease (HCC)   Iron deficiency anemia   CKD (chronic kidney disease) stage 3, GFR 30-59 ml/min   Acute myeloid leukemia not having achieved remission (HCC)    Sepsis/SIRS - POA -Mild upper respiratory symptoms,  -patient is vaccinated, on admission SARS-CoV-2 screen negative -Sepsis/SIRS etiology resolved--temp 98.8, WBC 5.6, lactic acid 2.2--1.1 now, creatinine 1.29  -Patient met the sepsis criteria on admission with temp of 100.9, tachypneic with respiratory rate of 23, tachycardic heart rate as high as 126,, creatinine elevated to 1.41 (immunocompromised patient) -Blood and urine cultures been obtained --started on broad-spectrum  antibiotics   -CXR shows improved aeration compared to previous.  Urinalysis is not convincing for UTI.      -Continue empiric antibiotics IV vancomycin cefepime and Flagyl -as patient is immunocompromised  - He has a central line in place right chest.  May also be fever of malignancy.   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 Mike Gip.   -On chemotherapy with azacitidine and venetoclax. -Hold venetoclax for now  Anemia of chronic disease and iron deficiency: -Monitor H&H closely - Chronic and stable.   Has been requiring frequent transfusions as an outpatient.    CKD stage IIIb: - Cr.  1.37, 1.41 >>  1.29 today  Chronic appears stable.  Continue monitor.  Crohn's disease: -Currently stable Has chronic intermittent diarrhea.  Currently stable.  BPH with chronic urinary retention: -Stable Patient self catheterizes.  Generalized weakness: Request PT/OT eval.   Ethics: DNR-patient confirms After reviewing medical records, ongoing cancer, immunocompromise, presenting with source, sepsis ruled out.  Nevertheless due to comorbidities, patient frailty and age anticipated prognosis to be poor. Palliative care consult -determine goal of care  DVT prophylaxis: Subcutaneous heparin Code Status: DNR, confirmed with patient on admission Family Communication: Discussed with patient, he has discussed with family Disposition Plan: From home, dispo pending further infectious work-up and PT/OT eval Consults called: None Admission status:         Status is: Observation  The patient remains OBS appropriate and will d/c before 2 midnights.  Dispo: The patient is from: Home  Anticipated  d/c is to: Home  Anticipated d/c date is: 3 days  Patient currently is not medically stable to d/c.        Nutritional status:        Cultures; Blood Cultures x 2 >> NGT Urine Culture  >>> NGT    Antimicrobials: 06/08/2020 vancomycin/cefepime>>     Consultants: Palliative care     Procedures:   No admission procedures for hospital encounter.     Antimicrobials:  Anti-infectives (From admission, onward)   Start     Dose/Rate Route Frequency Ordered Stop   06/09/20 1000  valACYclovir (VALTREX) tablet 500 mg        500 mg Oral Daily 06/08/20 2123     06/09/20 1000  vancomycin (VANCOREADY) IVPB 750 mg/150 mL        750 mg 150 mL/hr over 60 Minutes Intravenous Every 12 hours 06/08/20 2154     06/09/20 0700  ceFEPIme (MAXIPIME) 2 g in sodium chloride 0.9 % 100 mL IVPB        2 g 200 mL/hr over 30 Minutes Intravenous Every 12 hours 06/08/20 2154     06/08/20 2200  metroNIDAZOLE (FLAGYL) IVPB 500 mg        500 mg 100 mL/hr over 60 Minutes Intravenous Every 8 hours 06/08/20 2121     06/08/20 2100  vancomycin (VANCOREADY) IVPB 1750 mg/350 mL        1,750 mg 175 mL/hr over 120 Minutes Intravenous  Once 06/08/20 1921 06/08/20 2316   06/08/20 1900  ceFEPIme (MAXIPIME) 2 g in sodium chloride 0.9 % 100 mL IVPB        2 g 200 mL/hr over 30 Minutes Intravenous  Once 06/08/20 1854 06/08/20 1951       Medication:  . allopurinol  150 mg Oral Daily  . Cyanocobalamin  1,000 mcg Oral Q1200  . folic acid  1 mg Oral [id number]  . heparin  5,000 Units Subcutaneous Q8H  . latanoprost  1 drop Both Eyes QHS  . timolol  1 drop Both Eyes Daily  . valACYclovir  500 mg Oral Daily    acetaminophen **OR** acetaminophen, benzonatate, ondansetron **OR** ondansetron (ZOFRAN) IV   Objective:   Vitals:   06/09/20 0015 06/09/20 0500 06/09/20 0600 06/09/20 0640  BP:  (!) 148/80 (!) 141/77 (!) 147/80  Pulse: 90 92 90 89  Resp: (!) _0 Temp:        TempSrc:      SpO2: 92% 93% 96% 94%  Weight:      Height:        Intake/Output Summary (Last 24 hours) at 06/09/2020 0728 Last data filed at 06/09/2020 0727 Gross per 24 hour  Intake 1773.15 ml  Output --  Net 1773.15 ml   Filed Weights   06/08/20 1703  Weight: 70.8 kg     Examination:   Physical Exam  Constitution:  Alert, cooperative, no distress,  Appears calm and comfortable  Psychiatric: Normal and stable mood and affect, cognition intact,   HEENT: Normocephalic, PERRL, otherwise with in Normal limits  Chest:Chest symmetric Cardio vascular:  S1/S2, RRR, No murmure, No Rubs or Gallops  pulmonary: Clear to auscultation bilaterally, respirations unlabored, negative wheezes / crackles Abdomen: Soft, non-tender, non-distended, bowel sounds,no masses, no organomegaly Muscular skeletal: Limited exam - in bed, able to move all 4 extremities, Normal strength,  Neuro: CNII-XII intact. , normal motor and sensation, reflexes intact  Extremities: No pitting edema lower extremities, +2 pulses  Skin: Dry, warm to touch, negative for any Rashes, No open wounds Wounds: per nursing documentation    ------------------------------------------------------------------------------------------------------------------------------------------    LABs:  CBC Latest Ref Rng & Units 06/09/2020 06/08/2020 06/06/2020  WBC 4.0 - 10.5 K/uL 5.6 6.7 4.4  Hemoglobin 13.0 - 17.0 g/dL 8.8(L) 9.9(L) 7.8(L)  Hematocrit 39 - 52 % 25.9(L) 30.0(L) 24.8(L)  Platelets 150 - 400 K/uL 159 153 112(L)   CMP Latest Ref Rng & Units 06/09/2020 06/08/2020 06/08/2020  Glucose 70 - 99 mg/dL 84 111(H) 90  BUN 8 - 23 mg/dL 24(H) 25(H) 21  Creatinine 0.61 - 1.24 mg/dL 1.29(H) 1.41(H) 1.37(H)  Sodium 135 - 145 mmol/L 130(L) 129(L) 131(L)  Potassium 3.5 - 5.1 mmol/L 3.3(L) 4.1 3.7  Chloride 98 - 111 mmol/L 98 98 99  CO2 22 - 32 mmol/L _0 Calcium 8.9 - 10.3 mg/dL 8.3(L) 8.4(L) 8.5(L)  Total Protein 6.5 - 8.1 g/dL - -  -  Total Bilirubin 0.3 - 1.2 mg/dL - - -  Alkaline Phos 38 - 126 U/L - - -  AST 15 - 41 U/L - - -  ALT 0 - 44 U/L - - -       Micro Results Recent Results (from the past 240 hour(s))  Culture, blood (Routine X 2) w Reflex to ID Panel     Status: None (Preliminary result)   Collection Time: 06/08/20  3:14 PM   Specimen: BLOOD  Result Value Ref Range Status   Specimen Description   Final    BLOOD RIGHT ARM Performed at [facility], [address], [place], [place] [address]    Special Requests   Final    BOTTLES DRAWN AEROBIC AND ANAEROBIC Blood Culture adequate volume Performed at [facility], [address] [place], [place] [address]    Culture   Final    NO GROWTH < 24 HOURS Performed at [facility], [address] [place], [place] [address]    Report Status PENDING  Incomplete  Culture, blood (Routine X 2) w Reflex to ID Panel     Status: None (Preliminary result)   Collection Time: 06/08/20  3:33 PM   Specimen: BLOOD  Result Value Ref Range Status   Specimen Description   Final    BLOOD LEFT ARM Performed at [facility], [address], [place], [place] [address]    Special Requests   Final    BOTTLES DRAWN AEROBIC AND ANAEROBIC Blood Culture adequate volume Performed at [facility], [address] [place], [place] [address]    Culture   Final    NO GROWTH < 24 HOURS Performed at [facility], [address], [place], [place] [address]    Report Status PENDING  Incomplete  SARS Coronavirus 2 by RT PCR (hospital order, performed in [place] hospital lab) Nasopharyngeal Nasopharyngeal Swab     Status: None   Collection Time: 06/08/20  5:23 PM   Specimen: Nasopharyngeal Swab  Result Value Ref Range Status   SARS Coronavirus 2 NEGATIVE NEGATIVE Final    Comment: (  NOTE) SARS-CoV-2 target nucleic acids are NOT DETECTED.  The SARS-CoV-2 RNA is generally detectable in upper and lower respiratory specimens  during the acute phase of infection. The lowest concentration of SARS-CoV-2 viral copies this assay can detect is 250 copies / mL. A negative result does not preclude SARS-CoV-2 infection and should not be used as the sole basis for treatment or other patient management decisions.  A negative result may occur with improper specimen collection / handling, submission of specimen other than nasopharyngeal swab, presence of viral mutation(s) within the areas targeted by this assay, and inadequate number of viral copies (<250 copies / mL). A negative result must be combined with clinical observations, patient history, and epidemiological information.  Fact Sheet for Patients:   StrictlyIdeas.no  Fact Sheet for Healthcare Providers: BankingDealers.co.za  This test is not yet approved or  cleared by the [place] FDA and has been authorized for detection and/or diagnosis of SARS-CoV-2 by FDA under an Emergency Use Authorization (EUA).  This EUA will remain in effect (meaning this test can be used) for the duration of the COVID-19 declaration under Section 564(b)(1) of the Act, 21 U.S.C. section 360bbb-3(b)(1), unless the authorization is terminated or revoked sooner.  Performed at [facility], [address] [place], [place] [address]     Radiology Reports DG Chest 2 View  Result Date: 06/08/2020 CLINICAL DATA:  Chronic cough, shortness of breath EXAM: CHEST - 2 VIEW COMPARISON:  Prior chest x-ray 04/26/2020 FINDINGS: Tunneled right IJ central venous catheter present. The tip overlies the superior cavoatrial junction. Stable mild cardiomegaly. Mild pulmonary vascular congestion without overt edema. Significantly improved aeration compared to the prior radiograph. Small layering left pleural effusion with associated atelectasis. Stable chronic bronchitic changes and areas of linear scarring. No pneumothorax. No focal airspace  infiltrate. IMPRESSION: 1. Overall, significantly improved aeration compared to prior imaging from 04/26/2020. 2. Stable cardiomegaly and pulmonary vascular congestion without overt edema. 3. Small left pleural effusion with associated atelectasis. 4. Tunneled right IJ central line in good position. Electronically Signed   By: Jacqulynn Cadet M.D.   On: 06/08/2020 14:04   IR Fluoro Guide CV Line Left  Result Date: 05/18/2020 CLINICAL DATA:  AML, needs venous access for treatment regimen. Renal insufficiency. EXAM: TUNNELED CENTRAL VENOUS CATHETER PLACEMENT WITH ULTRASOUND AND FLUOROSCOPIC GUIDANCE TECHNIQUE: The procedure, risks, benefits, and alternatives were explained to the patient. Questions regarding the procedure were encouraged and answered. The patient understands and consents to the procedure. Patency of the right IJ vein was confirmed with ultrasound with image documentation. An appropriate skin site was determined. Region was prepped using maximum barrier technique including cap and mask, sterile gown, sterile gloves, large sterile sheet, and Chlorhexidine as cutaneous antisepsis. The region was infiltrated locally with 1% lidocaine. Under real-time ultrasound guidance, the right IJ vein was accessed with a 21 gauge micropuncture needle; the needle tip within the vein was confirmed with ultrasound image documentation. 68F single-lumen cuffed PowerLine tunneled from a right anterior chest wall approach to the dermatotomy site. Needle exchanged over the 018 guidewire for transitional dilator, through which the catheter which had been cut to 22 cm was advanced under intermittent fluoroscopy, positioned with its tip at the cavoatrial junction. Spot chest radiograph confirms good catheter position. No pneumothorax. Catheter was flushed per protocol. Catheter secured externally with O Prolene suture. The right IJ dermatotomy site was closed with Dermabond. COMPLICATIONS: COMPLICATIONS None immediate  FLUOROSCOPY TIME:  Less than 0.1 minute; 0.25 mGy COMPARISON:  None IMPRESSION: 1. Technically successful placement of tunneled right IJ tunneled single-lumen power injectable catheter with ultrasound and fluoroscopic guidance. Ready for routine use. Electronically Signed   By: Lucrezia Europe M.D.   On: 05/18/2020 11:33    SIGNED: Deatra Meryl, MD, FACP, FHM. [facility],  Pager (please use [email or web address] to page/text)  If 7PM-7AM, please contact night-coverage [email or web address] Ota [facility] 06/09/2020, 7:28 AM

## 2020-06-09 NOTE — Progress Notes (Signed)
PT Cancellation Note  Patient Details Name: Ruben [name] MRN: [id number] DOB: 1946/06/06   Cancelled Treatment:    Reason Eval/Treat Not Completed: Medical issues which prohibited therapy, NSG advised to hold today.   Alanson Puls, [place] DPT 06/09/2020, 4:41 PM

## 2020-06-09 NOTE — Consult Note (Signed)
  PULMONARY / CRITICAL CARE MEDICINE  Name: Ruben [name] MRN: [id number] DOB: [birth year]    LOS: 0  Referring Provider:  Dr. Barrett Henle Reason for Referral: Acute dyspnea  PCCM called to see patient for acute onset dyspnea. Briefly this is a 74 year old Caucasian male with a history of polycythemia vera that has transformed to AML since June 2021. He is currently undergoing chemotherapy. He was being seen at the oncology center for routine labs and follow-up when he was noted to be febrile, weak and had some altered mental status. While in the ED, he was found to be hypotensive and given some fluids and he became acutely short of breath. At the time of assessment, patient had recovered and is currently on room air without requiring any oxygen. He denies any shortness of breath and the nurses report no issues with patient. At this point he is not appropriate for any ICU level care hence PCCM will sign off. Will call PCCM if needed. It is okay for patient to be admitted to the [place] floor with telemetry or without telemetry.  Zailey Audia S. [facility] ANP-BC Pulmonary and Critical Care Medicine [facility] Pager [phone] or [phone]  NB: This document was prepared using Dragon voice recognition software and may include unintentional dictation errors.    06/09/2020, 10:19 PM

## 2020-06-10 ENCOUNTER — Encounter: Payer: Self-pay | Admitting: Family Medicine

## 2020-06-10 LAB — CBC WITH DIFFERENTIAL/PLATELET
Abs Immature Granulocytes: 1.39 10*3/uL — ABNORMAL HIGH (ref 0.00–0.07)
Basophils Absolute: 0.2 10*3/uL — ABNORMAL HIGH (ref 0.0–0.1)
Basophils Relative: 2 %
Eosinophils Absolute: 0 10*3/uL (ref 0.0–0.5)
Eosinophils Relative: 0 %
HCT: 28.6 % — ABNORMAL LOW (ref 39.0–52.0)
Hemoglobin: 9.1 g/dL — ABNORMAL LOW (ref 13.0–17.0)
Immature Granulocytes: 17 %
Lymphocytes Relative: 16 %
Lymphs Abs: 1.3 10*3/uL (ref 0.7–4.0)
MCH: 28.1 pg (ref 26.0–34.0)
MCHC: 31.8 g/dL (ref 30.0–36.0)
MCV: 88.3 fL (ref 80.0–100.0)
Monocytes Absolute: 0.5 10*3/uL (ref 0.1–1.0)
Monocytes Relative: 7 %
Neutro Abs: 4.6 10*3/uL (ref 1.7–7.7)
Neutrophils Relative %: 58 %
Platelets: 206 10*3/uL (ref 150–400)
RBC: 3.24 MIL/uL — ABNORMAL LOW (ref 4.22–5.81)
RDW: 18.8 % — ABNORMAL HIGH (ref 11.5–15.5)
WBC Morphology: INCREASED
WBC: 8 10*3/uL (ref 4.0–10.5)
nRBC: 3.6 % — ABNORMAL HIGH (ref 0.0–0.2)

## 2020-06-10 LAB — BASIC METABOLIC PANEL
Anion gap: 10 (ref 5–15)
BUN: 35 mg/dL — ABNORMAL HIGH (ref 8–23)
CO2: 22 mmol/L (ref 22–32)
Calcium: 8.7 mg/dL — ABNORMAL LOW (ref 8.9–10.3)
Chloride: 100 mmol/L (ref 98–111)
Creatinine, Ser: 1.6 mg/dL — ABNORMAL HIGH (ref 0.61–1.24)
GFR calc Af Amer: 49 mL/min — ABNORMAL LOW (ref >60)
GFR calc non Af Amer: 42 mL/min — ABNORMAL LOW (ref >60)
Glucose, Bld: 88 mg/dL (ref 70–99)
Potassium: 3.6 mmol/L (ref 3.5–5.1)
Sodium: 132 mmol/L — ABNORMAL LOW (ref 135–145)

## 2020-06-10 LAB — URINE CULTURE: Culture: NO GROWTH

## 2020-06-10 LAB — PROCALCITONIN: Procalcitonin: >150 ng/mL

## 2020-06-10 MED ORDER — CHLORHEXIDINE GLUCONATE CLOTH 2 % EX PADS
6.0000 | MEDICATED_PAD | Freq: Every day | CUTANEOUS | Status: DC
  Administered 2020-06-13 – 2020-06-16 (×4): 6 via TOPICAL

## 2020-06-10 MED ORDER — VENETOCLAX 100 MG PO TABS
100.0000 mg | ORAL_TABLET | Freq: Every day | ORAL | Status: DC
  Filled 2020-06-10 (×2): qty 1

## 2020-06-10 MED ORDER — SODIUM CHLORIDE 0.9 % IV SOLN
INTRAVENOUS | Status: DC | PRN
  Administered 2020-06-10 – 2020-06-12 (×3): 250 mL via INTRAVENOUS
  Administered 2020-06-12: 1000 mL via INTRAVENOUS
  Administered 2020-06-13: 250 mL via INTRAVENOUS

## 2020-06-10 MED ORDER — FINASTERIDE 5 MG PO TABS
5.0000 mg | ORAL_TABLET | Freq: Every evening | ORAL | Status: DC
  Administered 2020-06-10 – 2020-06-14 (×5): 5 mg via ORAL
  Filled 2020-06-10 (×5): qty 1

## 2020-06-10 MED ORDER — ORAL CARE MOUTH RINSE
15.0000 mL | Freq: Two times a day (BID) | OROMUCOSAL | Status: DC
  Administered 2020-06-10 – 2020-06-16 (×8): 15 mL via OROMUCOSAL

## 2020-06-10 NOTE — ED Notes (Signed)
Wife at bedside.

## 2020-06-10 NOTE — ED Notes (Signed)
Pt sitting at side of bed with breakfast tray.

## 2020-06-10 NOTE — ED Notes (Signed)
Report given to Ashley RN

## 2020-06-10 NOTE — ED Notes (Signed)
Wife at bedside providing pt bed bath at this time.

## 2020-06-10 NOTE — Progress Notes (Signed)
PROGRESS NOTE    Patient: Ruben [name]                            PCP: [name], MD                    DOB: 04-29-1946            DOA: 06/08/2020 QIW:[id number]             DOS: 06/10/2020, 10:44 AM   LOS: 1 day   Date of Service: The patient was seen and examined on 06/10/2020  Subjective:   Patient was seen and examined this morning, stable off oxygen, denies any chest pain, much improved shortness of breath.  Yesterday afternoon 06/09/2020 patient went to acute respiratory failure, hypoxia, tachycardia, tachypnea. It was thought patient was volume overloaded, Lasix was given, small dose of Ativan,.  Patient was placed on nonrebreather mask, follow-up with chest x-ray, labs including BNP which was elevated Oncologist, ICU pulmonary was consulted   Overall patient responded well to the above treatment, this morning much more stable awake in no respiratory distress   Brief Narrative:   Ruben [name] is a 74 y.o. 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  ED Course:  Initial vitals showed BP 138/85, pulse 115, RR 18, temp 100.9 Fahrenheit, SPO2 96% on room air. WBC 6.7, creatinine 1.41, lactic acid 2.2, procalcitonin 5.82. UA -clear,  SARS-CoV-2 PCR is negative.   TB chest x-ray shows right IJ central line, cardiomegaly and pulmonary vascular congestion    Assessment & Plan:   Principal Problem:   SIRS (systemic inflammatory response syndrome) (HCC) Active Problems:   BPH (benign prostatic hyperplasia)   Crohn disease (HCC)   Iron deficiency anemia   CKD (chronic kidney disease) stage 3, GFR 30-59 ml/min   Acute myeloid leukemia not having achieved remission (HCC)   Sepsis (HCC)   Brief acute respiratory failure -Acute  respiratory failure likely due to volume overload -IV fluid given to the sepsis, sepsis protocol without bolus -Yesterday afternoon 06/09/2020 patient developed hypoxia, tachypnea, tachycardia, T-max of 100.4 Was thought was due to volume overload, patient was anxious, small dose of 0.5 Ativan given, patient was placed on nonrebreather mask, 40 mg of Lasix was given Patient responded well to treatment, currently has been weaned off oxygen, vitals are stable -Currently hemodynamically stable ICU critical pulmonary was consulted, his oncologist team was notified   Sepsis/SIRS - POA -upper respiratory symptoms -patient is vaccinated, on admission SARS-CoV-2 screen negative -Aside from brief respiratory failure yesterday much improved today, Hemodynamically stable -T-max 100.4  -Sepsis/SIRS etiology resolved--temp this a.m. 98.6, WBC 8.0, lactic acid 1.5, creatinine 1.60  -Patient met the sepsis criteria on admission with temp of 100.9, tachypneic with respiratory rate of 23, tachycardic heart rate as high as 126,, creatinine elevated to 1.41 (immunocompromised patient) -Blood and urine cultures been obtained --started on broad-spectrum antibiotics   -CXR shows improved aeration compared to previous.  Urinalysis is not convincing for UTI.      -Continue empiric antibiotics IV vancomycin cefepime and Flagyl -as patient is immunocompromised  - He has a central line in place right chest.  May also be fever of malignancy.   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 Mike Gip.   -On chemotherapy with azacitidine and venetoclax. -on  venetoclax   Anemia of chronic disease and iron  deficiency: -Monitoring H&H, stable   Has been requiring frequent transfusions as an outpatient.    CKD stage IIIb: - Cr.  1.37, 1.41 >>  1.29 >>1.60 today  Chronic appears stable.  Continue monitor.  Crohn's disease: Has chronic intermittent diarrhea.   Stable no signs of flareup  BPH with chronic urinary retention: -Stable Patient self catheterizes.  Generalized weakness: Request PT/OT eval.   Ethics: DNR-patient confirms After reviewing medical records, ongoing cancer, immunocompromise, presenting with source, sepsis ruled out.  Nevertheless due to comorbidities, patient frailty and age anticipated prognosis to be poor. Palliative care consult -determine goal of care    DVT prophylaxis: Subcutaneous heparin Code Status: DNR, confirmed with patient on admission Family Communication: Discussed with patient, he has discussed with family Disposition Plan: From home, dispo pending further infectious work-up and PT/OT eval Consults called: None Admission status:         Status is - Inpatient  Misty status of inpatient due to sepsis etiology, requiring IV antibiotics, fluids, acute respiratory failure, acute renal injury, needing input from consultants... Needing intervention for hemodynamic stability   Dispo: The patient is from: Home  Anticipated d/c is to: Home  Anticipated d/c date is: 3 days  Patient currently is not medically stable to d/c.        Nutritional status:        Cultures; Blood Cultures x 2 >> NGT Urine Culture  >>> NGT    Antimicrobials: 06/08/2020 vancomycin/cefepime>>     Consultants: Palliative care     Procedures:   No admission procedures for hospital encounter.     Antimicrobials:  Anti-infectives (From admission, onward)   Start     Dose/Rate Route Frequency Ordered Stop   06/09/20 2200  vancomycin (VANCOREADY) IVPB 750 mg/150 mL        750 mg 150 mL/hr over 60  Minutes Intravenous Every 12 hours 06/09/20 2153     06/09/20 1000  valACYclovir (VALTREX) tablet 500 mg        500 mg Oral Daily 06/08/20 2123     06/09/20 1000  vancomycin (VANCOREADY) IVPB 750 mg/150 mL  Status:  Discontinued        750 mg 150 mL/hr over 60 Minutes Intravenous Every 12 hours 06/08/20 2154 06/09/20 2152   06/09/20 0700  ceFEPIme (MAXIPIME) 2 g in sodium chloride 0.9 % 100 mL IVPB        2 g 200 mL/hr over 30 Minutes Intravenous Every 12 hours 06/08/20 2154     06/08/20 2200  metroNIDAZOLE (FLAGYL) IVPB 500 mg        500 mg 100 mL/hr over 60 Minutes Intravenous Every 8 hours 06/08/20 2121     06/08/20 2100  vancomycin (VANCOREADY) IVPB 1750 mg/350 mL        1,750 mg 175 mL/hr over 120 Minutes Intravenous  Once 06/08/20 1921 06/08/20 2316   06/08/20 1900  ceFEPIme (MAXIPIME) 2 g in sodium chloride 0.9 % 100 mL IVPB        2 g 200 mL/hr over 30 Minutes Intravenous  Once 06/08/20 1854 06/08/20 1951  Medication:  . allopurinol  150 mg Oral Daily  . amLODipine  5 mg Oral Daily  . finasteride  5 mg Oral QPM  . folic acid  1 mg Oral [id number]  . latanoprost  1 drop Both Eyes QHS  . timolol  1 drop Both Eyes Daily  . valACYclovir  500 mg Oral Daily  . venetoclax  100 mg Oral Daily  . vitamin B-12  1,000 mcg Oral Q1200    acetaminophen **OR** acetaminophen, benzonatate, hydrALAZINE, LORazepam, ondansetron **OR** ondansetron (ZOFRAN) IV   Objective:   Vitals:   06/10/20 0800 06/10/20 0830 06/10/20 0900 06/10/20 1000  BP: 122/76 132/81 130/76 127/68  Pulse: 93 96 95 96  Resp: 20 (!) 23 20 19   Temp:      TempSrc:      SpO2: 98% 96% 100% 99%  Weight:      Height:        Intake/Output Summary (Last 24 hours) at 06/10/2020 1044 Last data filed at 06/10/2020 0919 Gross per 24 hour  Intake 490 ml  Output 1100 ml  Net -610 ml   Filed Weights   06/08/20 1703  Weight: 70.8 kg     Examination:      Physical Exam:   General:  Alert, oriented,  cooperative, no distress; --- much improved shortness of breath, anxiety  HEENT:  Normocephalic, PERRL, otherwise with in Normal limits   Neuro:  CNII-XII intact. , normal motor and sensation, reflexes intact   Lungs:   Clear to auscultation BL, Respirations unlabored, no wheezes / crackles  Cardio:    S1/S2, RRR, No murmure, No Rubs or Gallops   Abdomen:   Soft, non-tender, bowel sounds active all four quadrants,  no guarding or peritoneal signs.  Muscular skeletal:   Generalized weaknesses, Limited exam - in bed, able to move all 4 extremities, Normal strength,  2+ pulses,  symmetric, No pitting edema  Skin:  Dry, warm to touch, negative for any Rashes, No open wounds  Wounds: Please see nursing documentation Pressure Injury (Active)     Location:   Location Orientation:   Staging:   Wound Description (Comments):   Present on Admission:          ------------------------------------------------------------------------------------------------------------------------------------------    LABs:  CBC Latest Ref Rng & Units 06/10/2020 06/09/2020 06/08/2020  WBC 4.0 - 10.5 K/uL 8.0 5.6 6.7  Hemoglobin 13.0 - 17.0 g/dL 9.1(L) 8.8(L) 9.9(L)  Hematocrit 39 - 52 % 28.6(L) 25.9(L) 30.0(L)  Platelets 150 - 400 K/uL 206 159 153   CMP Latest Ref Rng & Units 06/10/2020 06/09/2020 06/08/2020  Glucose 70 - 99 mg/dL 88 84 111(H)  BUN 8 - 23 mg/dL 35(H) 24(H) 25(H)  Creatinine 0.61 - 1.24 mg/dL 1.60(H) 1.29(H) 1.41(H)  Sodium 135 - 145 mmol/L 132(L) 130(L) 129(L)  Potassium 3.5 - 5.1 mmol/L 3.6 3.3(L) 4.1  Chloride 98 - 111 mmol/L 100 98 98  CO2 22 - 32 mmol/L 22 23 23   Calcium 8.9 - 10.3 mg/dL 8.7(L) 8.3(L) 8.4(L)  Total Protein 6.5 - 8.1 g/dL - - -  Total Bilirubin 0.3 - 1.2 mg/dL - - -  Alkaline Phos 38 - 126 U/L - - -  AST 15 - 41 U/L - - -  ALT 0 - 44 U/L - - -       Micro Results Recent Results (from the past 240 hour(s))  Culture, blood (Routine X 2) w Reflex to ID Panel      Status: None (Preliminary result)  Collection Time: 06/08/20  3:14 PM   Specimen: BLOOD  Result Value Ref Range Status   Specimen Description   Final    BLOOD RIGHT ARM Performed at [facility], [address], [place], [place] [address]    Special Requests   Final    BOTTLES DRAWN AEROBIC AND ANAEROBIC Blood Culture adequate volume Performed at [facility], [address], [place], [place] [address]    Culture   Final    NO GROWTH 2 DAYS Performed at [facility], [address], [place], [place] [address]    Report Status PENDING  Incomplete  Culture, blood (Routine X 2) w Reflex to ID Panel     Status: None (Preliminary result)   Collection Time: 06/08/20  3:33 PM   Specimen: BLOOD  Result Value Ref Range Status   Specimen Description   Final    BLOOD LEFT ARM Performed at [facility], [address] [place], [place] [address]    Special Requests   Final    BOTTLES DRAWN AEROBIC AND ANAEROBIC Blood Culture adequate volume Performed at [facility], [address] [place], [place] [address]    Culture   Final    NO GROWTH 2 DAYS Performed at [facility], [address], [place], [place] [address]    Report Status PENDING  Incomplete  SARS Coronavirus 2 by RT PCR (hospital order, performed in [place] hospital lab) Nasopharyngeal Nasopharyngeal Swab     Status: None   Collection Time: 06/08/20  5:23 PM   Specimen: Nasopharyngeal Swab  Result Value Ref Range Status   SARS Coronavirus 2 NEGATIVE NEGATIVE Final    Comment: (NOTE) SARS-CoV-2 target nucleic acids are NOT DETECTED.  The SARS-CoV-2 RNA is generally detectable in upper and lower respiratory specimens during the acute phase of infection. The lowest concentration of SARS-CoV-2 viral copies this assay can detect is 250 copies / mL. A negative result does not preclude SARS-CoV-2 infection and should not be used as the sole basis for treatment or  other patient management decisions.  A negative result may occur with improper specimen collection / handling, submission of specimen other than nasopharyngeal swab, presence of viral mutation(s) within the areas targeted by this assay, and inadequate number of viral copies (<250 copies / mL). A negative result must be combined with clinical observations, patient history, and epidemiological information.  Fact Sheet for Patients:   StrictlyIdeas.no  Fact Sheet for Healthcare Providers: BankingDealers.co.za  This test is not yet approved or  cleared by the [place] FDA and has been authorized for detection and/or diagnosis of SARS-CoV-2 by FDA under an Emergency Use Authorization (EUA).  This EUA will remain in effect (meaning this test can be used) for the duration of the COVID-19 declaration under Section 564(b)(1) of the Act, 21 U.S.C. section 360bbb-3(b)(1), unless the authorization is terminated or revoked sooner.  Performed at [facility], [address] [place], [place] [address]   Urine culture     Status: None   Collection Time: 06/08/20  5:23 PM   Specimen: Urine, Random  Result Value Ref Range Status   Specimen Description   Final    URINE, RANDOM Performed at [facility], [address], [place], [place] [address]    Special Requests   Final    NONE Performed at [facility], [address] [place], [place] [address]    Culture   Final    NO GROWTH  Performed at [facility], Center [address] [place],  [address]    Report Status 06/10/2020 FINAL  Final    Radiology Reports DG Chest 2 View  Result Date: 06/08/2020 CLINICAL DATA:  Chronic cough, shortness of breath EXAM: CHEST - 2 VIEW COMPARISON:  Prior chest x-ray 04/26/2020 FINDINGS: Tunneled right IJ central venous catheter present. The tip overlies the superior cavoatrial junction. Stable mild cardiomegaly. Mild  pulmonary vascular congestion without overt edema. Significantly improved aeration compared to the prior radiograph. Small layering left pleural effusion with associated atelectasis. Stable chronic bronchitic changes and areas of linear scarring. No pneumothorax. No focal airspace infiltrate. IMPRESSION: 1. Overall, significantly improved aeration compared to prior imaging from 04/26/2020. 2. Stable cardiomegaly and pulmonary vascular congestion without overt edema. 3. Small left pleural effusion with associated atelectasis. 4. Tunneled right IJ central line in good position. Electronically Signed   By: Jacqulynn Cadet M.D.   On: 06/08/2020 14:04   US Venous Img Lower Bilateral (DVT)  Result Date: 06/09/2020 CLINICAL DATA:  Bilateral lower extremity pain 1 week. EXAM: BILATERAL LOWER EXTREMITY VENOUS DOPPLER ULTRASOUND TECHNIQUE: Gray-scale sonography with compression, as well as color and duplex ultrasound, were performed to evaluate the deep venous system(s) from the level of the common femoral vein through the popliteal and proximal calf veins. COMPARISON:  None. FINDINGS: VENOUS Normal compressibility of the common femoral, superficial femoral, and popliteal veins, as well as the visualized calf veins bilaterally. Visualized portions of profunda femoral vein and great saphenous vein unremarkable bilaterally. No filling defects to suggest DVT on grayscale or color Doppler imaging. Doppler waveforms show normal direction of venous flow, normal respiratory plasticity and response to augmentation bilaterally. OTHER None. Limitations: none IMPRESSION: Negative bilateral lower extremity Doppler evaluation. Electronically Signed   By: Marin Olp M.D.   On: 06/09/2020 17:09   IR Fluoro Guide CV Line Left  Result Date: 05/18/2020 CLINICAL DATA:  AML, needs venous access for treatment regimen. Renal insufficiency. EXAM: TUNNELED CENTRAL VENOUS CATHETER PLACEMENT WITH ULTRASOUND AND FLUOROSCOPIC GUIDANCE  TECHNIQUE: The procedure, risks, benefits, and alternatives were explained to the patient. Questions regarding the procedure were encouraged and answered. The patient understands and consents to the procedure. Patency of the right IJ vein was confirmed with ultrasound with image documentation. An appropriate skin site was determined. Region was prepped using maximum barrier technique including cap and mask, sterile gown, sterile gloves, large sterile sheet, and Chlorhexidine as cutaneous antisepsis. The region was infiltrated locally with 1% lidocaine. Under real-time ultrasound guidance, the right IJ vein was accessed with a 21 gauge micropuncture needle; the needle tip within the vein was confirmed with ultrasound image documentation. 90F single-lumen cuffed PowerLine tunneled from a right anterior chest wall approach to the dermatotomy site. Needle exchanged over the 018 guidewire for transitional dilator, through which the catheter which had been cut to 22 cm was advanced under intermittent fluoroscopy, positioned with its tip at the cavoatrial junction. Spot chest radiograph confirms good catheter position. No pneumothorax. Catheter was flushed per protocol. Catheter secured externally with O Prolene suture. The right IJ dermatotomy site was closed with Dermabond. COMPLICATIONS: COMPLICATIONS None immediate FLUOROSCOPY TIME:  Less than 0.1 minute; 0.25 mGy COMPARISON:  None IMPRESSION: 1. Technically successful placement of tunneled right IJ tunneled single-lumen power injectable catheter with ultrasound and fluoroscopic guidance. Ready for routine use. Electronically Signed   By: Lucrezia Europe M.D.   On: 05/18/2020 11:33   DG Chest Port 1 View  Result Date:  06/09/2020 CLINICAL DATA:  Dyspnea, tachypnea, tachycardia EXAM: PORTABLE CHEST 1 VIEW COMPARISON:  06/08/2020 chest radiograph. FINDINGS: Right internal jugular central venous catheter terminates in the lower third of the SVC. Stable cardiomediastinal  silhouette with mild cardiomegaly. No pneumothorax. Small left pleural effusion is unchanged. No significant right pleural effusion. Diffuse hazy parahilar interstitial lung opacities, increased. IMPRESSION: 1. Stable mild cardiomegaly. Diffuse hazy parahilar interstitial lung opacities, increased, favor pulmonary edema. 2. Stable small left pleural effusion. Electronically Signed   By: Ilona Sorrel M.D.   On: 06/09/2020 15:21    SIGNED: Deatra Shoaib, MD, FACP, FHM. [facility],  Pager (please use [email or web address] to page/text)  If 7PM-7AM, please contact night-coverage [email or web address] Ota [facility] 06/10/2020, 10:44 AM

## 2020-06-10 NOTE — Evaluation (Signed)
Physical Therapy Evaluation Patient Details Name: Ruben [name] MRN: [id number] DOB: 1946-01-27 Today's Date: 06/10/2020   History of Present Illness  74 y.o. 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 Adm for SIRS  Clinical Impression  Pt was seen for mobility as tolerated on the side of the bed, and noted his struggles with getting up to stand and to sequence to try to stand.  He is generally weak with esp weak L hand grip.  Pt will be seen for acute therapy to get OOB to chair, to progress with standing tolerance and to increase LE strength.    Follow Up Recommendations SNF    Equipment Recommendations  None recommended by PT    Recommendations for Other Services       Precautions / Restrictions Precautions Precautions: Fall Precaution Comments: requires assist to sequence all movement Restrictions Weight Bearing Restrictions: No      Mobility  Bed Mobility Overal bed mobility: Needs Assistance Bed Mobility: Supine to Sit;Sit to Supine     Supine to sit: Mod assist Sit to supine: Mod assist      Transfers Overall transfer level: Needs assistance Equipment used: Rolling walker (2 wheeled);1 person hand held assist Transfers: Sit to/from Stand           General transfer comment: pt cannot assist on cue to try to stand  Ambulation/Gait             General Gait Details: non ambulatory  Stairs            Wheelchair Mobility    Modified Rankin (Stroke Patients Only)       Balance                                             Pertinent Vitals/Pain Pain  Assessment: Faces Faces Pain Scale: Hurts a little bit Pain Location: joints    Home Living Family/patient expects to be discharged to:: Private residence Living Arrangements: Spouse/significant other Available Help at Discharge: Family;Available 24 hours/day Type of Home: House Home Access: Stairs to enter Entrance Stairs-Rails: Right;Left;Can reach both Entrance Stairs-Number of Steps: 4-5 Home Layout: One level Home Equipment: Shower seat;Grab bars - tub/shower      Prior Function Level of Independence: Independent         Comments: pt prev walked with no AD but  now cannot walk since chemo     Hand Dominance   Dominant Hand: Right    Extremity/Trunk Assessment   Upper Extremity Assessment Upper Extremity Assessment: Generalized weakness    Lower Extremity Assessment Lower Extremity Assessment: Generalized weakness    Cervical / Trunk Assessment Cervical / Trunk Assessment: Kyphotic  Communication   Communication: No difficulties  Cognition Arousal/Alertness: Awake/alert Behavior During Therapy: Flat affect Overall Cognitive Status: No family/caregiver present to determine baseline cognitive functioning                                 General Comments: pt is not demonstrating ability to follow cues  General Comments General comments (skin integrity, edema, etc.): pt was seen for eval of mobility, with dense cues for standing attempts     Exercises     Assessment/Plan    PT Assessment Patient needs continued PT services  PT Problem List Decreased strength;Decreased range of motion;Decreased activity tolerance;Decreased balance;Decreased mobility;Decreased coordination;Decreased knowledge of use of DME       PT Treatment Interventions DME instruction;Gait training;Stair training;Functional mobility training;Therapeutic activities;Therapeutic exercise;Balance training;Neuromuscular re-education;Patient/family education    PT Goals  (Current goals can be found in the Care Plan section)  Acute Rehab PT Goals Patient Stated Goal: to go home PT Goal Formulation: With patient Time For Goal Achievement: 06/24/20 Potential to Achieve Goals: Fair    Frequency Min 2X/week   Barriers to discharge Decreased caregiver support;Inaccessible home environment house with stairs and spouse alone    Co-evaluation               AM-PAC PT "6 Clicks" Mobility  Outcome Measure Help needed turning from your back to your side while in a flat bed without using bedrails?: A Little Help needed moving from lying on your back to sitting on the side of a flat bed without using bedrails?: A Lot Help needed moving to and from a bed to a chair (including a wheelchair)?: A Lot Help needed standing up from a chair using your arms (e.g., wheelchair or bedside chair)?: Total Help needed to walk in hospital room?: Total Help needed climbing 3-5 steps with a railing? : Total 6 Click Score: 10    End of Session Equipment Utilized During Treatment: Gait belt Activity Tolerance: Patient limited by fatigue;Treatment limited secondary to medical complications (Comment) Patient left: in bed;with call bell/phone within reach;with bed alarm set Nurse Communication: Mobility status PT Visit Diagnosis: Muscle weakness (generalized) (M62.81);Difficulty in walking, not elsewhere classified (R26.2)    Time: 5956-3875 PT Time Calculation (min) (ACUTE ONLY): 32 min   Charges:   PT Evaluation $PT Eval Moderate Complexity: 1 Mod PT Treatments $Therapeutic Activity: 8-22 mins       Ramond Dial 06/10/2020, 11:05 PM  Mee Hives, PT MS Acute Rehab Dept. Number: [facility] [place]

## 2020-06-10 NOTE — ED Notes (Signed)
PT ambulatory to BR without difficulty, stand by assist only.  Pt reports BM, pt assisted back to room and into bed.  Pt denies needs or c/o, NAD noted on returning to room, VSS, will continue to monitor.

## 2020-06-11 ENCOUNTER — Ambulatory Visit: Payer: Medicare Other | Admitting: Hematology and Oncology

## 2020-06-11 ENCOUNTER — Inpatient Hospital Stay: Payer: Medicare Other

## 2020-06-11 ENCOUNTER — Encounter: Payer: Self-pay | Admitting: Family Medicine

## 2020-06-11 ENCOUNTER — Ambulatory Visit: Payer: Medicare Other

## 2020-06-11 DIAGNOSIS — E43 Unspecified severe protein-calorie malnutrition: Secondary | ICD-10-CM | POA: Insufficient documentation

## 2020-06-11 DIAGNOSIS — Z515 Encounter for palliative care: Secondary | ICD-10-CM

## 2020-06-11 LAB — CBC WITH DIFFERENTIAL/PLATELET
Abs Immature Granulocytes: 1.01 10*3/uL — ABNORMAL HIGH (ref 0.00–0.07)
Basophils Absolute: 0.2 10*3/uL — ABNORMAL HIGH (ref 0.0–0.1)
Basophils Relative: 2 %
Eosinophils Absolute: 0 10*3/uL (ref 0.0–0.5)
Eosinophils Relative: 0 %
HCT: 27.3 % — ABNORMAL LOW (ref 39.0–52.0)
Hemoglobin: 8.7 g/dL — ABNORMAL LOW (ref 13.0–17.0)
Immature Granulocytes: 15 %
Lymphocytes Relative: 14 %
Lymphs Abs: 1 10*3/uL (ref 0.7–4.0)
MCH: 27.8 pg (ref 26.0–34.0)
MCHC: 31.9 g/dL (ref 30.0–36.0)
MCV: 87.2 fL (ref 80.0–100.0)
Monocytes Absolute: 0.5 10*3/uL (ref 0.1–1.0)
Monocytes Relative: 7 %
Neutro Abs: 4.3 10*3/uL (ref 1.7–7.7)
Neutrophils Relative %: 62 %
Platelets: 185 10*3/uL (ref 150–400)
RBC: 3.13 MIL/uL — ABNORMAL LOW (ref 4.22–5.81)
RDW: 19.2 % — ABNORMAL HIGH (ref 11.5–15.5)
Smear Review: NORMAL
WBC: 6.9 10*3/uL (ref 4.0–10.5)
nRBC: 2.9 % — ABNORMAL HIGH (ref 0.0–0.2)

## 2020-06-11 LAB — BASIC METABOLIC PANEL
Anion gap: 8 (ref 5–15)
BUN: 38 mg/dL — ABNORMAL HIGH (ref 8–23)
CO2: 22 mmol/L (ref 22–32)
Calcium: 8.8 mg/dL — ABNORMAL LOW (ref 8.9–10.3)
Chloride: 101 mmol/L (ref 98–111)
Creatinine, Ser: 1.71 mg/dL — ABNORMAL HIGH (ref 0.61–1.24)
GFR calc Af Amer: 45 mL/min — ABNORMAL LOW (ref >60)
GFR calc non Af Amer: 39 mL/min — ABNORMAL LOW (ref >60)
Glucose, Bld: 85 mg/dL (ref 70–99)
Potassium: 3.1 mmol/L — ABNORMAL LOW (ref 3.5–5.1)
Sodium: 131 mmol/L — ABNORMAL LOW (ref 135–145)

## 2020-06-11 LAB — PHOSPHORUS: Phosphorus: 4.8 mg/dL — ABNORMAL HIGH (ref 2.5–4.6)

## 2020-06-11 LAB — MRSA PCR SCREENING: MRSA by PCR: NEGATIVE

## 2020-06-11 LAB — BRAIN NATRIURETIC PEPTIDE: B Natriuretic Peptide: 324.2 pg/mL — ABNORMAL HIGH (ref 0.0–100.0)

## 2020-06-11 LAB — MAGNESIUM: Magnesium: 1.9 mg/dL (ref 1.7–2.4)

## 2020-06-11 LAB — PROCALCITONIN: Procalcitonin: >150 ng/mL

## 2020-06-11 MED ORDER — ADULT MULTIVITAMIN W/MINERALS CH
1.0000 | ORAL_TABLET | Freq: Every day | ORAL | Status: DC
  Administered 2020-06-12 – 2020-06-14 (×3): 1 via ORAL
  Filled 2020-06-11 (×3): qty 1

## 2020-06-11 MED ORDER — ENSURE ENLIVE PO LIQD
237.0000 mL | Freq: Three times a day (TID) | ORAL | Status: DC
  Administered 2020-06-11 – 2020-06-16 (×11): 237 mL via ORAL

## 2020-06-11 MED ORDER — SODIUM CHLORIDE 0.9 % IV SOLN: INTRAVENOUS | Status: AC

## 2020-06-11 MED ORDER — ENOXAPARIN SODIUM 40 MG/0.4ML ~~LOC~~ SOLN
40.0000 mg | SUBCUTANEOUS | Status: DC
  Administered 2020-06-11 – 2020-06-12 (×2): 40 mg via SUBCUTANEOUS
  Filled 2020-06-11 (×2): qty 0.4

## 2020-06-11 MED ORDER — TECHNETIUM TO 99M ALBUMIN AGGREGATED
4.0000 | Freq: Once | INTRAVENOUS | Status: AC | PRN
  Administered 2020-06-11: 4.37 via INTRAVENOUS

## 2020-06-11 NOTE — Progress Notes (Signed)
Physical Therapy Treatment Patient Details Name: Ruben [name] MRN: [id number] DOB: 04-29-1946 Today's Date: 06/11/2020    History of Present Illness 74 y.o. 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 Adm for SIRS    PT Comments    Pt was anxious about participating in physical therapy and needed encouragement from the spouse and PT to complete bed mobility and transfers. Pt able to complete all observed aspects of bed mobility with SBA and was able to come to standing and ambulate with min guard. Pt was noted to have increased anxiety while standing and breathing became short and shallow, but oxygen remain at 99-100%. While ambulating, pt became nauseous, but all vitals were [facility]. Pt will benefit from PT services in a SNF setting upon discharge to safely address deficits listed in patient problem list for decreased caregiver assistance and eventual return to PLOF.    Follow Up Recommendations  Home health PT     Equipment Recommendations  None recommended by PT    Recommendations for Other Services       Precautions / Restrictions Precautions Precautions: Fall Restrictions Weight Bearing Restrictions: No    Mobility  Bed Mobility Overal bed mobility: Needs Assistance Bed Mobility: Supine to Sit;Sit to Supine     Supine to sit: Supervision Sit to supine: Supervision   General bed mobility comments: pt able to complete all aspects of bed mobility with no VC and Tactile cues  Transfers Overall transfer level: Needs assistance Equipment used: Rolling walker (2 wheeled) Transfers: Sit to/from Stand Sit to Stand:  Min guard         General transfer comment: pt able to easily come up to standing.  Ambulation/Gait Ambulation/Gait assistance: Min guard Gait Distance (Feet): 4 Feet 2'x 2' Assistive device: Rolling walker (2 wheeled) Gait Pattern/deviations: WFL(Within Functional Limits)     General Gait Details: pt noted to be anxious while walking and became nauseated. pt notes extreme anxiety while walking and standing up   Stairs             Wheelchair Mobility    Modified Rankin (Stroke Patients Only)       Balance Overall balance assessment: Needs assistance Sitting-balance support: No upper extremity supported;Feet unsupported Sitting balance-Leahy Scale: Good     Standing balance support: Bilateral upper extremity supported Standing balance-Leahy Scale: Good Standing balance comment: pt's balance was steady in standing. pt noted to be anxious                            Cognition Arousal/Alertness: Awake/alert Behavior During Therapy: Flat affect Overall Cognitive Status: Within Functional Limits for tasks assessed                                        Exercises Total Joint Exercises Ankle Circles/Pumps: AROM;Both;10 reps Quad Sets: AROM;Both;10 reps Heel Slides: AROM;Both;10 reps Straight Leg Raises: AROM;Both;10 reps Long Arc Quad: AROM;Both;10 reps Marching in Standing: AROM;Both;5 reps    General Comments        Pertinent Vitals/Pain Pain Assessment: Faces Faces Pain Scale: Hurts  a little bit Pain Location: knees Pain Descriptors / Indicators: Aching Pain Intervention(s): Limited activity within patient's tolerance;Monitored during session    Home Living                      Prior Function            PT Goals (current goals can now be found in the care plan section) Acute Rehab PT Goals Patient Stated Goal: to go home PT Goal Formulation: With patient Time For Goal Achievement: 06/24/20 Potential to  Achieve Goals: Good Progress towards PT goals: Progressing toward goals    Frequency    Min 2X/week      PT Plan Current plan remains appropriate    Co-evaluation              AM-PAC PT "6 Clicks" Mobility   Outcome Measure  Help needed turning from your back to your side while in a flat bed without using bedrails?: None Help needed moving from lying on your back to sitting on the side of a flat bed without using bedrails?: None Help needed moving to and from a bed to a chair (including a wheelchair)?: A Little Help needed standing up from a chair using your arms (e.g., wheelchair or bedside chair)?: A Little Help needed to walk in hospital room?: A Little Help needed climbing 3-5 steps with a railing? : A Lot 6 Click Score: 19    End of Session Equipment Utilized During Treatment: Gait belt Activity Tolerance: Patient tolerated treatment well Patient left: with call bell/phone within reach;in chair;with family/visitor present;with chair alarm set Nurse Communication: Mobility status;Other (comment) (nurse notified about catheter needing to be emptied) PT Visit Diagnosis: Muscle weakness (generalized) (M62.81);Difficulty in walking, not elsewhere classified (R26.2)     Time: 4944-9675 PT Time Calculation (min) (ACUTE ONLY): 41 min  Charges:                        Hervey Ard, SPT 06/11/20. 5:43 PM

## 2020-06-11 NOTE — Consult Note (Signed)
[place]  [phone] [phone]   Name: Ruben [name] Date: 06/11/2020 MRN: [id number]  DOB: [birth year]  Patient Care Team: [name], MD as PCP - General (Family Medicine) [name], MD (Inactive) (Gastroenterology) Dingeldein, Remo Lipps, MD (Ophthalmology) Lequita Asal, MD as Referring Physician (Hematology and Oncology) Dasher, Rayvon Char, MD as Referring Physician (Dermatology) Anthonette Legato, MD as Consulting Physician (Internal Medicine) Hollice Espy, MD as Consulting Physician (Urology) Crissie Sickles, MD as Referring Physician (Hematology and Oncology) Vladimir Crofts, MD as Consulting Physician (Neurology)    REASON FOR CONSULTATION: Ruben [name] is a 74 y.o. 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  Chest x-ray revealed possible multifocal pneumonia.  Patient was referred to palliative care to help address goals and manage ongoing symptoms..   SOCIAL HISTORY:     reports that he quit smoking about 16 years ago. His smoking use included cigarettes. He has a 5.00 pack-year smoking history. He has never used smokeless tobacco. He reports previous alcohol use of about 4.0 - 5.0 standard drinks of alcohol per week. He reports that he does not use drugs.  Patient is married and lives at home with his wife.  He has 2 adult children, one of whom lives in [place] and the other lives locally.  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  ADVANCE DIRECTIVES:  On file  CODE STATUS: DNR  PAST MEDICAL HISTORY: Past Medical History:  Diagnosis Date   Blood dyscrasia    BPH (benign prostatic hyperplasia)    Cancer (HCC)     SKIN/ POLYCYTHEMIA VERA   Chronic kidney disease    RENAL INSUFF (40%)   Crohn disease ([place])    Crohn's disease ([place])    Glaucoma    Glaucoma    Gout    History of chicken pox    Myelofibrosis ([place])    Nosebleed    RBBB    Right bundle branch block     PAST SURGICAL HISTORY:  Past Surgical History:  Procedure Laterality Date   CATARACT EXTRACTION W/PHACO Left 07/13/2018   Procedure: CATARACT EXTRACTION PHACO AND INTRAOCULAR LENS PLACEMENT ([place]);  Surgeon: [name], MD;  Location: [facility] ORS;  Service: Ophthalmology;  Laterality: Left;  [place] 00:39  CDE 5.14 Fluid pack lot # [id number]   CATARACT EXTRACTION W/PHACO Right 08/03/2018   Procedure: CATARACT EXTRACTION PHACO AND INTRAOCULAR LENS PLACEMENT ([facility]);  Surgeon: [name], MD;  Location: [facility] ORS;  Service: Ophthalmology;  Laterality: Right;  [place] 00:43.4 CDE 5.23 Fluid pack Lot # [id number]   COLONOSCOPY WITH PROPOFOL N/A 03/27/2017   Procedure: COLONOSCOPY WITH PROPOFOL;  Surgeon: [name], MD;  Location: [facility]COPY;  Service: Endoscopy;  Laterality: N/A;   IR FLUORO GUIDE CV LINE LEFT  05/18/2020   MOHS SURGERY     EAR   PROSTATE SURGERY  2008   Prostate Biopsy in [place] to elevated PSA.  per patient normal   Skin Lesion Basal cell removed     wart removal     from eyelid    HEMATOLOGY/ONCOLOGY HISTORY:  Oncology History  Acute myeloid leukemia not having achieved remission ([place])  03/07/2020 Initial Diagnosis   Acute myeloid leukemia not having achieved remission ([place])  05/21/2020 -  Chemotherapy   The patient had azaCITIDine (VIDAZA) chemo injection 150 mg, 78.5 mg/m2 = 142.5 mg, Subcutaneous,  Once, 1 of 4 cycles Administration: 150 mg (05/21/2020), 150 mg (05/22/2020), 150 mg (05/23/2020), 150 mg (05/24/2020), 150 mg (05/25/2020), 150 mg (05/29/2020), 150 mg (05/30/2020)  for chemotherapy treatment.      ALLERGIES:  has No Known Allergies.  MEDICATIONS:  Current  Facility-Administered Medications  Medication Dose Route Frequency Provider Last Rate Last Admin   0.9 %  sodium chloride infusion   Intravenous PRN Skipper Cliche A, MD 10 mL/hr at 06/10/20 1820 250 mL at 06/10/20 1820   acetaminophen (TYLENOL) tablet 650 mg  650 mg Oral Q6H PRN Lenore Cordia, MD   650 mg at 06/10/20 2031   Or   acetaminophen (TYLENOL) suppository 650 mg  650 mg Rectal Q6H PRN Lenore Cordia, MD       allopurinol (ZYLOPRIM) tablet 150 mg  150 mg Oral Daily Zada Finders R, MD   150 mg at 06/11/20 0954   amLODipine (NORVASC) tablet 5 mg  5 mg Oral Daily Shahmehdi, Seyed A, MD   5 mg at 06/11/20 0954   benzonatate (TESSALON) capsule 100 mg  100 mg Oral BID PRN Lenore Cordia, MD       ceFEPIme (MAXIPIME) 2 g in sodium chloride 0.9 % 100 mL IVPB  2 g Intravenous Q12H Rowland Lathe, RPH 200 mL/hr at 06/11/20 0802 2 g at 06/11/20 0802   Chlorhexidine Gluconate Cloth 2 % PADS 6 each  6 each Topical Daily Shahmehdi, Seyed A, MD       enoxaparin (LOVENOX) injection 40 mg  40 mg Subcutaneous Q24H Dallie Piles, RPH   40 mg at 06/11/20 1338   feeding supplement (ENSURE ENLIVE) (ENSURE ENLIVE) liquid 237 mL  237 mL Oral TID BM Shahmehdi, Seyed A, MD   237 mL at 06/11/20 1345   finasteride (PROSCAR) tablet 5 mg  5 mg Oral QPM Shahmehdi, Seyed A, MD   5 mg at 40/98/11 9147   folic acid (FOLVITE) tablet 1 mg  1 mg Oral Q1200 Zada Finders R, MD   1 mg at 06/11/20 1338   hydrALAZINE (APRESOLINE) injection 10 mg  10 mg Intravenous Q4H PRN Shahmehdi, Seyed A, MD       latanoprost (XALATAN) 0.005 % ophthalmic solution 1 drop  1 drop Both Eyes QHS Zada Finders R, MD   1 drop at 06/10/20 2031   LORazepam (ATIVAN) injection 0.5 mg  0.5 mg Intravenous Q6H PRN Shahmehdi, Seyed A, MD   0.5 mg at 06/10/20 1457   MEDLINE mouth rinse  15 mL Mouth Rinse BID Shahmehdi, Seyed A, MD   15 mL at 06/11/20 1345   metroNIDAZOLE (FLAGYL) IVPB 500 mg  500 mg Intravenous Q8H Patel, Vishal  R, MD 100 mL/hr at 06/11/20 1340 500 mg at 06/11/20 1340   [START ON 06/12/2020] multivitamin with minerals tablet 1 tablet  1 tablet Oral Daily Shahmehdi, Seyed A, MD       ondansetron (ZOFRAN) tablet 4 mg  4 mg Oral Q6H PRN Lenore Cordia, MD       Or   ondansetron (ZOFRAN) injection 4 mg  4 mg Intravenous Q6H PRN Zada Finders R, MD       timolol (TIMOPTIC) 0.5 % ophthalmic solution 1 drop  1 drop Both Eyes Daily Lenore Cordia, MD   1 drop at 06/11/20 0954   venetoclax TABS 100 mg  100 mg Oral Daily Shahmehdi, Seyed A, MD       vitamin B-12 (CYANOCOBALAMIN) tablet 1,000 mcg  1,000 mcg Oral Q1200 Zada Finders R, MD   1,000 mcg at 06/11/20 1338    VITAL SIGNS: BP 133/69    Pulse 78    Temp (!) 97.5 F (36.4 C) (Oral)    Resp 15    Ht 6' (1.829 m)    Wt 156 lb 1.4 oz (70.8 kg)    SpO2 100%    BMI 21.17 kg/m  Filed Weights   06/08/20 1703  Weight: 156 lb 1.4 oz (70.8 kg)    Estimated body mass index is 21.17 kg/m as calculated from the following:   Height as of this encounter: 6' (1.829 m).   Weight as of this encounter: 156 lb 1.4 oz (70.8 kg).  LABS: CBC:    Component Value Date/Time   WBC 6.9 06/11/2020 0530   HGB 8.7 (L) 06/11/2020 0530   HGB 19.3 (H) 08/24/2012 1632   HCT 27.3 (L) 06/11/2020 0530   HCT 59.8 (H) 08/24/2012 1632   PLT 185 06/11/2020 0530   PLT 551 (H) 08/24/2012 1632   MCV 87.2 06/11/2020 0530   MCV 84 08/24/2012 1632   NEUTROABS 4.3 06/11/2020 0530   LYMPHSABS 1.0 06/11/2020 0530   MONOABS 0.5 06/11/2020 0530   EOSABS 0.0 06/11/2020 0530   BASOSABS 0.2 (H) 06/11/2020 0530   Comprehensive Metabolic Panel:    Component Value Date/Time   NA 131 (L) 06/11/2020 0530   NA 137 08/24/2012 1632   K 3.1 (L) 06/11/2020 0530   K 4.2 08/24/2012 1632   CL 101 06/11/2020 0530   CL 108 (H) 08/24/2012 1632   CO2 22 06/11/2020 0530   CO2 21 08/24/2012 1632   BUN 38 (H) 06/11/2020 0530   BUN 18 08/24/2012 1632   CREATININE 1.71 (H) 06/11/2020 0530    CREATININE 0.97 08/24/2012 1632   GLUCOSE 85 06/11/2020 0530   GLUCOSE 113 (H) 08/24/2012 1632   CALCIUM 8.8 (L) 06/11/2020 0530   CALCIUM 9.6 08/24/2012 1632   AST 27 06/04/2020 1006   AST 42 (H) 08/24/2012 1632   ALT 11 06/04/2020 1006   ALT 41 08/24/2012 1632   ALKPHOS 66 06/04/2020 1006   ALKPHOS 155 (H) 08/24/2012 1632   BILITOT 1.0 06/04/2020 1006   BILITOT 0.9 08/24/2012 1632   PROT 9.1 (H) 06/04/2020 1006   PROT 8.6 (H) 08/24/2012 1632   ALBUMIN 2.6 (L) 06/04/2020 1006   ALBUMIN 4.1 08/24/2012 1632    RADIOGRAPHIC STUDIES: DG Chest 1 View  Result Date: 06/11/2020 CLINICAL DATA:  Shortness of breath EXAM: CHEST  1 VIEW COMPARISON:  June 09, 2020 FINDINGS: Central catheter tip is in the superior vena cava. No pneumothorax. There is cardiomegaly with pulmonary vascularity within normal limits. There is airspace opacity in the left lower lobe. Elsewhere there is interstitial prominence in the mid lung regions. No appreciable adenopathy. No bone lesions. IMPRESSION: Airspace opacity consistent with pneumonia in the left lower lung region and to a lesser extent in each mid lung region. Appearance indicative of multifocal pneumonia. Heart remains prominent with pulmonary vascularity within normal limits. No adenopathy. Central catheter tip in superior vena cava without pneumothorax. Electronically Signed   By: Lowella Grip III M.D.   On: 06/11/2020 08:34   DG Chest 2 View  Result Date: 06/08/2020 CLINICAL DATA:  Chronic cough, shortness of breath EXAM: CHEST - 2 VIEW COMPARISON:  Prior chest x-ray  04/26/2020 FINDINGS: Tunneled right IJ central venous catheter present. The tip overlies the superior cavoatrial junction. Stable mild cardiomegaly. Mild pulmonary vascular congestion without overt edema. Significantly improved aeration compared to the prior radiograph. Small layering left pleural effusion with associated atelectasis. Stable chronic bronchitic changes and areas of  linear scarring. No pneumothorax. No focal airspace infiltrate. IMPRESSION: 1. Overall, significantly improved aeration compared to prior imaging from 04/26/2020. 2. Stable cardiomegaly and pulmonary vascular congestion without overt edema. 3. Small left pleural effusion with associated atelectasis. 4. Tunneled right IJ central line in good position. Electronically Signed   By: Jacqulynn Cadet M.D.   On: 06/08/2020 14:04   NM Pulmonary Perfusion  Result Date: 06/11/2020 CLINICAL DATA:  Shortness of breath EXAM: NUCLEAR MEDICINE PERFUSION LUNG SCAN TECHNIQUE: Perfusion images were obtained in multiple projections after intravenous injection of radiopharmaceutical. Views: Anterior, posterior, left lateral, right lateral, RPO, LPO, RAO, LAO RADIOPHARMACEUTICALS:  4.37 mCi Tc-37mMAA IV COMPARISON:  Chest radiograph June 11, 2020 FINDINGS: There is slight decreased radiotracer uptake in each apex, nonsegmental. Elsewhere, the distribution of uptake on the perfusion study is normal. IMPRESSION: Slightly decreased radiotracer uptake in the apex regions, nonsegmental, a finding of questionable significance. This study is considered low probability for pulmonary embolus. Electronically Signed   By: WLowella GripIII M.D.   On: 06/11/2020 12:02   [place]Venous Img Lower Bilateral (DVT)  Result Date: 06/09/2020 CLINICAL DATA:  Bilateral lower extremity pain 1 week. EXAM: BILATERAL LOWER EXTREMITY VENOUS DOPPLER ULTRASOUND TECHNIQUE: Gray-scale sonography with compression, as well as color and duplex ultrasound, were performed to evaluate the deep venous system(s) from the level of the common femoral vein through the popliteal and proximal calf veins. COMPARISON:  None. FINDINGS: VENOUS Normal compressibility of the common femoral, superficial femoral, and popliteal veins, as well as the visualized calf veins bilaterally. Visualized portions of profunda femoral vein and great saphenous vein unremarkable  bilaterally. No filling defects to suggest DVT on grayscale or color Doppler imaging. Doppler waveforms show normal direction of venous flow, normal respiratory plasticity and response to augmentation bilaterally. OTHER None. Limitations: none IMPRESSION: Negative bilateral lower extremity Doppler evaluation. Electronically Signed   By: DMarin OlpM.D.   On: 06/09/2020 17:09   IR Fluoro Guide CV Line Left  Result Date: 05/18/2020 CLINICAL DATA:  AML, needs venous access for treatment regimen. Renal insufficiency. EXAM: TUNNELED CENTRAL VENOUS CATHETER PLACEMENT WITH ULTRASOUND AND FLUOROSCOPIC GUIDANCE TECHNIQUE: The procedure, risks, benefits, and alternatives were explained to the patient. Questions regarding the procedure were encouraged and answered. The patient understands and consents to the procedure. Patency of the right IJ vein was confirmed with ultrasound with image documentation. An appropriate skin site was determined. Region was prepped using maximum barrier technique including cap and mask, sterile gown, sterile gloves, large sterile sheet, and Chlorhexidine as cutaneous antisepsis. The region was infiltrated locally with 1% lidocaine. Under real-time ultrasound guidance, the right IJ vein was accessed with a 21 gauge micropuncture needle; the needle tip within the vein was confirmed with ultrasound image documentation. 30F single-lumen cuffed PowerLine tunneled from a right anterior chest wall approach to the dermatotomy site. Needle exchanged over the 018 guidewire for transitional dilator, through which the catheter which had been cut to 22 cm was advanced under intermittent fluoroscopy, positioned with its tip at the cavoatrial junction. Spot chest radiograph confirms good catheter position. No pneumothorax. Catheter was flushed per protocol. Catheter secured externally with O Prolene suture. The right IJ dermatotomy site  was closed with Dermabond. COMPLICATIONS: COMPLICATIONS None immediate  FLUOROSCOPY TIME:  Less than 0.1 minute; 0.25 mGy COMPARISON:  None IMPRESSION: 1. Technically successful placement of tunneled right IJ tunneled single-lumen power injectable catheter with ultrasound and fluoroscopic guidance. Ready for routine use. Electronically Signed   By: Lucrezia Europe M.D.   On: 05/18/2020 11:33   DG Chest Port 1 View  Result Date: 06/09/2020 CLINICAL DATA:  Dyspnea, tachypnea, tachycardia EXAM: PORTABLE CHEST 1 VIEW COMPARISON:  06/08/2020 chest radiograph. FINDINGS: Right internal jugular central venous catheter terminates in the lower third of the SVC. Stable cardiomediastinal silhouette with mild cardiomegaly. No pneumothorax. Small left pleural effusion is unchanged. No significant right pleural effusion. Diffuse hazy parahilar interstitial lung opacities, increased. IMPRESSION: 1. Stable mild cardiomegaly. Diffuse hazy parahilar interstitial lung opacities, increased, favor pulmonary edema. 2. Stable small left pleural effusion. Electronically Signed   By: Ilona Sorrel M.D.   On: 06/09/2020 15:21    PERFORMANCE STATUS (ECOG) : 3 - Symptomatic, >50% confined to bed  Review of Systems Unless otherwise noted, a complete review of systems is negative.  Physical Exam General: NAD Pulmonary: Unlabored Extremities: no edema, no joint deformities Skin: no rashes Neurological: Weakness but otherwise nonfocal  IMPRESSION: Met with patient and wife.  Patient reports that he is doing much better today.  He had an episode of respiratory failure over the weekend thought secondary to volume overload.  He is currently off oxygen and sitting in the chair.  Patient is undecided on his goals for future treatment.  He had been planning to make decisions following the results of his bone marrow biopsy, which was scheduled later this week.  Both patient and wife would like to pursue bone marrow biopsy in the hospital if at all possible.  We discussed options for post hospital care  including home health versus rehab versus hospice.  Both patient and wife seem to be leaning towards hospice.  They recognize that hospice would require patient to discontinue treatment and stop transfusions.  Again, patient would like to hold on decision-making until the bone marrow biopsy and he has a chance to speak with Dr. Mike Gip.  PLAN: -Continue current scope of treatment -Consider bone marrow biopsy while in the hospital -Will follow  Case and plan discussed with Dr. Mike Gip   Time Total: 60 minutes  Visit consisted of counseling and education dealing with the complex and emotionally intense issues of symptom management and palliative care in the setting of serious and potentially life-threatening illness.Greater than 50%  of this time was spent counseling and coordinating care related to the above assessment and plan.  Signed by: Altha Harm, PhD, NP-C

## 2020-06-11 NOTE — Clinical Social Work Note (Signed)
This CSW working remote today. Attempted calling patient in room for readmission prevention screen. No answer. Will try again later.  Dayton Scrape, [place]

## 2020-06-11 NOTE — Progress Notes (Signed)
Initial Nutrition Assessment  DOCUMENTATION CODES:   Severe malnutrition in context of chronic illness  INTERVENTION:   Ensure Enlive po TID, each supplement provides 350 kcal and 20 grams of protein  MVI daily   Liberalize diet   NUTRITION DIAGNOSIS:   Severe Malnutrition related to cancer and cancer related treatments as evidenced by severe fat depletion, severe muscle depletion.  GOAL:   Patient will meet greater than or equal to 90% of their needs  MONITOR:   PO intake, Supplement acceptance, Labs, Weight trends, Skin, I & O's  REASON FOR ASSESSMENT:   Malnutrition Screening Tool    ASSESSMENT:   74 y.o. 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  Met with pt in room today. Pt reports fair appetite and oral intake at baseline. Pt reports that he ate 100% of scrambled eggs, 50% of french toast and 100% of an orange juice for breakfast this morning. Pt reports that he does drink Boost at home; pt is willing to drink Ensure while in hospital. RD will add supplements and MVI to help pt meet his estimated needs. RD will also liberalize pt's diet. Per chart, pt is down 6lbs(4%) over the past 2 months; this is not significant. Pt reports that his UBW was 170lbs several years ago.   Medications reviewed and include: allopurinol, lovenox, folic acid, MVI, G29, cefepime, metronidazole  Labs reviewed: Na 131(L), K 3.1(L), BUN 38(H), creat 1.71(H), P 4.8(H), Mg 1.9 wnl Hgb 8.7(L), Hct 27.3(L)  NUTRITION - FOCUSED PHYSICAL EXAM:    Most Recent Value  Orbital Region Severe depletion  Upper Arm Region Severe depletion   Thoracic and Lumbar Region Severe depletion  Buccal Region Severe depletion  Temple Region Severe depletion  Clavicle Bone Region Severe depletion  Clavicle and Acromion Bone Region Severe depletion  Scapular Bone Region Severe depletion  Dorsal Hand Severe depletion  Patellar Region Severe depletion  Anterior Thigh Region Severe depletion  Posterior Calf Region Severe depletion  Edema (RD Assessment) Mild  Hair Reviewed  Eyes Reviewed  Mouth Reviewed  Skin Reviewed  Nails Reviewed     Diet Order:   Diet Order            Diet regular Room service appropriate? Yes; Fluid consistency: Thin  Diet effective now                EDUCATION NEEDS:   Education needs have been addressed  Skin:  Skin Assessment: Reviewed RN Assessment (ecchymosis)  Last BM:  9/19  Height:   Ht Readings from Last 1 Encounters:  06/08/20 6' (1.829 m)    Weight:   Wt Readings from Last 1 Encounters:  06/08/20 70.8 kg    Ideal Body Weight:  80.9 kg  BMI:  Body mass index is 21.17 kg/m.  Estimated Nutritional Needs:   Kcal:  2000-2300kcal/day  Protein:  100-115g/day  Fluid:  1.8-2.1L/day  Koleen Distance MS, RD, LDN Please refer to [facility] for RD and/or RD on-call/weekend/after hours pager

## 2020-06-11 NOTE — TOC Initial Note (Signed)
Transition of Care [facility]) - Initial/Assessment Note    Patient Details  Name: Ruben [name] MRN: [id number] Date of Birth: 05-12-1946  [facility]) CM/SW Contact:    Candie Chroman, LCSW Phone Number: 06/11/2020, 2:11 PM  Clinical Narrative:  Readmission prevention screen complete. CSW working remote today. Called in room and wife answered. She stated patient recently took some pain medication and it would be best to talk with her. CSW introduced role and explained that discharge planning would be discussed. PCP is Lelon Huh, MD but he has not seen him in two years. Wife drives to his other appointments. [facility] [place] and [facility] pharmacy at [facility] for certain medications. The specialty pharmacy delivers the medications. Patient was active with [facility] for nursing prior to admission. No DME use including oxygen prior to admission. Discussed potential disposition plans: home with home health, SNF, or home with hospice. Patient's wife appears to prefer for him to return home but does admit she cannot provide the level of physical assistance he needs. She would like to receive more information from the medical team in order to make the best decision. CSW relayed this in a secure chat to Dr. Roger Shelter, Dr. Mike Gip, and Billey Chang, NP. No further concerns. CSW encouraged patient's wife to contact CSW as needed. CSW will continue to follow patient and his wife for support and facilitate discharge once medically stable.        Expected Discharge Plan:  (Unable to determine at this time.) Barriers to Discharge: Continued Medical Work up   Patient Goals and CMS Choice        Expected Discharge Plan and Services Expected Discharge Plan:  (Unable to determine at this time.)       Living arrangements for the past 2 months: Single Family Home                                      Prior Living Arrangements/Services Living  arrangements for the past 2 months: Single Family Home Lives with:: Spouse Patient language and need for interpreter reviewed:: Yes Do you feel safe going back to the place where you live?: Yes      Need for Family Participation in Patient Care: Yes (Comment) Care giver support system in place?: Yes (comment) Current home services: Home RN Criminal Activity/Legal Involvement Pertinent to Current Situation/Hospitalization: No - Comment as needed  Activities of Daily Living Home Assistive Devices/Equipment: None ADL Screening (condition at time of admission) Patient's cognitive ability adequate to safely complete daily activities?: Yes Is the patient deaf or have difficulty hearing?: No Does the patient have difficulty seeing, even when wearing glasses/contacts?: No Does the patient have difficulty concentrating, remembering, or making decisions?: No Patient able to express need for assistance with ADLs?: Yes Does the patient have difficulty dressing or bathing?: No Independently performs ADLs?: Yes (appropriate for developmental age) Does the patient have difficulty walking or climbing stairs?: Yes Weakness of Legs: None Weakness of Arms/Hands: None  Permission Sought/Granted Permission sought to share information with : Facility Sport and exercise psychologist, Family Supports    Share Information with NAME: Ruben [name]  Permission granted to share info w AGENCY: [place] granted to share info w Relationship: Wife  Permission granted to share info w Contact Information: [phone]  Emotional Assessment Appearance:: Appears stated age Attitude/Demeanor/Rapport: Unable to Assess  Affect (typically observed): Unable to Assess Orientation: : Oriented to Self, Oriented to Place, Oriented to  Time, Oriented to Situation Alcohol / Substance Use: Not Applicable Psych Involvement: No (comment)  Admission diagnosis:  Shortness of breath [R06.02] Tachypnea  [R06.82] Sinus tachycardia [R00.0] SOB (shortness of breath) [R06.02] Pain [R52] SIRS (systemic inflammatory response syndrome) (HCC) [R65.10] Sepsis (HCC) [A41.9] Fever in other diseases [R50.81] Sepsis without acute organ dysfunction, due to unspecified organism [facility]) [A41.9] Patient Active Problem List   Diagnosis Date Noted  . Sepsis ([place]) 06/09/2020  . SIRS (systemic inflammatory response syndrome) ([place]) 06/08/2020  . Thrombocytopenia ([place]) 06/02/2020  . Elevated bilirubin 05/28/2020  . Encounter for antineoplastic chemotherapy 05/21/2020  . Hypokalemia 05/12/2020  . Anemia due to antineoplastic chemotherapy 05/02/2020  . Chemotherapy-induced neutropenia ([place]) 05/02/2020  . Sepsis due to pneumonia ([place]) 04/25/2020  . Febrile neutropenia ([place]) 04/25/2020  . Leukopenia due to antineoplastic chemotherapy ([place]) 04/25/2020  . Epistaxis, recurrent 04/01/2020  . 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 Symptomatic anemia 03/02/2020  . Weight loss 03/02/2020  . Pneumonia of both lungs due to infectious organism 03/01/2020  . Elevated total protein 12/04/2019  . Secondary hyperparathyroidism of renal origin ([place]) 09/20/2019  . CKD (chronic kidney disease) stage 3, GFR 30-59 ml/min 09/20/2019  . Foot drop, right 06/09/2019  . Crohn's disease of small intestine without complication ([place]) 62/86/3817  . Right-sided thoracic back pain 04/06/2019  . Hyponatremia 04/06/2019  . Iron deficiency anemia 11/26/2018  . Goals of care, counseling/discussion 01/06/2018  . Myeloproliferative neoplasm ([place]) 01/04/2018  . High triglycerides 10/14/2017  . 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 Renal insufficiency 07/23/2016  . 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 Right ankle pain 10/31/2015  . Hyperuricemia 09/05/2015  . BPH (benign prostatic hyperplasia) 08/23/2015  . History of tobacco use 08/23/2015  . Glaucoma 08/23/2015  . Enlarged prostate  without lower urinary tract symptoms (luts) 08/23/2015  . Elevated prostate specific antigen (PSA) 08/23/2015  . Dermatitis 08/23/2015  . Crohn disease ([place]) 08/23/2015  . Other prurigo 08/23/2015  . Dorsalgia 08/23/2015  . Personal history of nicotine dependence 08/23/2015  . Polycythemia vera ([place]) 04/04/2015  . Secondary polycythemia 03/28/2015  . 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 Right bundle-branch block 05/03/2012   PCP:  [name], MD Pharmacy[facility]   [id number] [place] [place] [address]Phone: [phone]: [address] [address]Phone: [phone]: [phone]    Social Determinants of Health ([place] Interventions    Readmission Risk Interventions Readmission Risk Prevention Plan 06/11/2020 04/26/2020  Transportation Screening Complete Complete  PCP or Specialist Appt within 3-5 Days - Complete  Social Work Consult for [place]/Counseling - Complete  Palliative Care Screening - Not Applicable  Medication Review (Press photographer Complete Complete  PCP or Specialist appointment within 3-5 days of discharge Complete -  [place] Consult Complete -  SW Recovery Care/Counseling Consult Complete -  Palliative Care Screening Complete -  Skilled Nursing Facility Complete -  Some recent data might be hidden

## 2020-06-11 NOTE — Progress Notes (Signed)
[facility] Hematology/Oncology Progress Note  Date of admission: 06/08/2020  Hospital day:  06/11/2020  Chief Complaint: Ruben [name] is a 74 y.o. 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  Subjective: He is feeling much better today.  Thinking is clear.  He has eaten some.  He is off oxygen.  He remains fatigued.  Social History: The patient is alone today.  Allergies: No Known Allergies  Scheduled Medications: . allopurinol  150 mg Oral Daily  . amLODipine  5 mg Oral Daily  . Chlorhexidine Gluconate Cloth  6 each Topical Daily  . enoxaparin (LOVENOX) injection  40 mg Subcutaneous Q24H  . feeding supplement (ENSURE ENLIVE)  237 mL Oral TID BM  . finasteride  5 mg Oral QPM  . folic acid  1 mg Oral [id number]  . latanoprost  1 drop Both Eyes QHS  . mouth rinse  15 mL Mouth Rinse BID  . [START ON 06/12/2020] multivitamin with minerals  1 tablet Oral Daily  . timolol  1 drop Both Eyes Daily  . venetoclax  100 mg Oral Daily  . vitamin B-12  1,000 mcg Oral Q1200    Review of Systems: GENERAL:  Fatigued.  Afebrile.  Weight loss. PERFORMANCE STATUS (ECOG):  3 HEENT:  No visual changes, runny nose, sore throat, mouth sores or tenderness. Lungs: No shortness of breath or cough.  No hemoptysis. Cardiac:  No chest pain, palpitations, orthopnea, or PND. GI:  Decreased appetite.  Intermittent small volume loose stool.  No nausea, vomiting, constipation, melena or hematochezia. GU:  Foley catheter in place.  No urgency, frequency, dysuria, or hematuria. Musculoskeletal:  No back pain.  No joint pain.  No muscle tenderness. Extremities:  No pain or swelling. Skin:  No rashes or skin changes. Neuro:  Generalized weakness.  No headache, numbness or focal weakness, balance or coordination issues. Psych:  Mood fair. Pain:  No focal  pain. Review of systems:  All other systems reviewed and found to be negative.  Physical Exam: Blood pressure 133/69, pulse 78, temperature (!) 97.5 F (36.4 C), temperature source Oral, resp. rate 15, height 6' (1.829 m), weight 156 lb 1.4 oz (70.8 kg), SpO2 100 %.  GENERAL:  Chronically ill appearing thin man sitting lying comfortably on the medical unit in no acute distress. MENTAL STATUS:  Alert and oriented to person, place and time. HEAD:  Thin gray hair.  Temporal wasting.  Normocephalic, atraumatic, face symmetric, no Cushingoid features. EYES:  Pupils equal round and reactive to light and accomodation.  No conjunctivitis or scleral icterus. CHEST WALL:  Prominent ribs. RESPIRATORY:  Clear to auscultation anteriorly without rales, wheezes or rhonchi. CARDIOVASCULAR:  Regular rate and rhythm without murmur, rub or gallop. ABDOMEN:  Soft, non-tender, with active bowel sounds, and no hepatomegaly.  Spleen palpable. SKIN:  No rashes, ulcers or lesions. EXTREMITIES:  Foley catheter attached to left leg.  No edema, no skin discoloration or tenderness.  No palpable cords. NEUROLOGICAL: Unremarkable. PSYCH:  Appropriate.   Results for orders placed or performed during the hospital encounter of 06/08/20 (from the past 48 hour(s))  Lactic acid, plasma     Status: None   Collection Time: 06/09/20  6:33 PM  Result Value Ref Range   Lactic Acid, Venous 1.5 0.5 - 1.9 mmol/L    Comment: Performed at [facility], [address] [place], [place]  [address]  CBC with Differential/Platelet     Status: Abnormal   Collection Time: 06/10/20  2:44 AM  Result Value Ref Range   WBC 8.0 4.0 - 10.5 K/uL   RBC 3.24 (L) 4.22 - 5.81 MIL/uL   Hemoglobin 9.1 (L) 13.0 - 17.0 g/dL   HCT 28.6 (L) 39 - 52 %   MCV 88.3 80.0 - 100.0 fL   MCH 28.1 26.0 - 34.0 pg   MCHC 31.8 30.0 - 36.0 g/dL   RDW 18.8 (H) 11.5 - 15.5 %   Platelets 206 150 - 400 K/uL   nRBC 3.6 (H) 0.0 - 0.2 %   Neutrophils  Relative % 58 %   Neutro Abs 4.6 1.7 - 7.7 K/uL   Lymphocytes Relative 16 %   Lymphs Abs 1.3 0.7 - 4.0 K/uL   Monocytes Relative 7 %   Monocytes Absolute 0.5 0 - 1 K/uL   Eosinophils Relative 0 %   Eosinophils Absolute 0.0 0 - 0 K/uL   Basophils Relative 2 %   Basophils Absolute 0.2 (H) 0 - 0 K/uL   WBC Morphology INCREASED BANDS (>20% BANDS)    Immature Granulocytes 17 %   Abs Immature Granulocytes 1.39 (H) 0.00 - 0.07 K/uL   Polychromasia PRESENT    Basophilic Stippling PRESENT     Comment: Performed at [facility] [address], [place], [place] [address]  Basic metabolic panel     Status: Abnormal   Collection Time: 06/10/20  2:44 AM  Result Value Ref Range   Sodium 132 (L) 135 - 145 mmol/L   Potassium 3.6 3.5 - 5.1 mmol/L   Chloride 100 98 - 111 mmol/L   CO2 22 22 - 32 mmol/L   Glucose, Bld 88 70 - 99 mg/dL    Comment: Glucose reference range applies only to samples taken after fasting for at least 8 hours.   BUN 35 (H) 8 - 23 mg/dL   Creatinine, Ser 1.60 (H) 0.61 - 1.24 mg/dL   Calcium 8.7 (L) 8.9 - 10.3 mg/dL   GFR calc non Af Amer 42 (L) >60 mL/min   GFR calc Af Amer 49 (L) >60 mL/min   Anion gap 10 5 - 15    Comment: Performed at [facility], [address] [place], [place] [address]  Procalcitonin - Baseline     Status: None   Collection Time: 06/10/20  2:44 AM  Result Value Ref Range   Procalcitonin >150.00 ng/mL    Comment:        Interpretation: PCT >= 10 ng/mL: Important systemic inflammatory response, almost exclusively due to severe bacterial sepsis or septic shock. (NOTE)       Sepsis PCT Algorithm           Lower Respiratory Tract                                      Infection PCT Algorithm    ----------------------------     ----------------------------         PCT < 0.25 ng/mL                PCT < 0.10 ng/mL          Strongly encourage             Strongly discourage   discontinuation of antibiotics    initiation of  antibiotics    ----------------------------     -----------------------------  PCT 0.25 - 0.50 ng/mL            PCT 0.10 - 0.25 ng/mL               OR       >80% decrease in PCT            Discourage initiation of                                            antibiotics      Encourage discontinuation           of antibiotics    ----------------------------     -----------------------------         PCT >= 0.50 ng/mL              PCT 0.26 - 0.50 ng/mL                AND       <80% decrease in PCT             Encourage initiation of                                             antibiotics       Encourage continuation           of antibiotics    ----------------------------     -----------------------------        PCT >= 0.50 ng/mL                  PCT > 0.50 ng/mL               AND         increase in PCT                  Strongly encourage                                      initiation of antibiotics    Strongly encourage escalation           of antibiotics                                     -----------------------------                                           PCT <= 0.25 ng/mL                                                 OR                                        > 80% decrease in PCT  Discontinue / Do not initiate                                             antibiotics  Performed at [facility], [address] [place], [place] [address]   CBC with Differential/Platelet     Status: Abnormal   Collection Time: 06/11/20  5:30 AM  Result Value Ref Range   WBC 6.9 4.0 - 10.5 K/uL   RBC 3.13 (L) 4.22 - 5.81 MIL/uL   Hemoglobin 8.7 (L) 13.0 - 17.0 g/dL   HCT 27.3 (L) 39 - 52 %   MCV 87.2 80.0 - 100.0 fL   MCH 27.8 26.0 - 34.0 pg   MCHC 31.9 30.0 - 36.0 g/dL   RDW 19.2 (H) 11.5 - 15.5 %   Platelets 185 150 - 400 K/uL   nRBC 2.9 (H) 0.0 - 0.2 %   Neutrophils Relative % 62 %   Neutro Abs 4.3 1.7 - 7.7 K/uL   Lymphocytes  Relative 14 %   Lymphs Abs 1.0 0.7 - 4.0 K/uL   Monocytes Relative 7 %   Monocytes Absolute 0.5 0 - 1 K/uL   Eosinophils Relative 0 %   Eosinophils Absolute 0.0 0 - 0 K/uL   Basophils Relative 2 %   Basophils Absolute 0.2 (H) 0 - 0 K/uL   WBC Morphology MILD LEFT SHIFT (1-5% METAS, OCC MYELO, OCC BANDS)    RBC Morphology MIXED RBC POPULATION    Smear Review Normal platelet morphology    Immature Granulocytes 15 %   Abs Immature Granulocytes 1.01 (H) 0.00 - 0.07 K/uL   Polychromasia PRESENT     Comment: Performed at [facility], [address], [place], [place] [address]  Basic metabolic panel     Status: Abnormal   Collection Time: 06/11/20  5:30 AM  Result Value Ref Range   Sodium 131 (L) 135 - 145 mmol/L   Potassium 3.1 (L) 3.5 - 5.1 mmol/L   Chloride 101 98 - 111 mmol/L   CO2 22 22 - 32 mmol/L   Glucose, Bld 85 70 - 99 mg/dL    Comment: Glucose reference range applies only to samples taken after fasting for at least 8 hours.   BUN 38 (H) 8 - 23 mg/dL   Creatinine, Ser 1.71 (H) 0.61 - 1.24 mg/dL   Calcium 8.8 (L) 8.9 - 10.3 mg/dL   GFR calc non Af Amer 39 (L) >60 mL/min   GFR calc Af Amer 45 (L) >60 mL/min   Anion gap 8 5 - 15    Comment: Performed at [facility], [address] [place], [place] [address]  Procalcitonin - Baseline     Status: None   Collection Time: 06/11/20  5:30 AM  Result Value Ref Range   Procalcitonin >150.00 ng/mL    Comment:        Interpretation: PCT >= 10 ng/mL: Important systemic inflammatory response, almost exclusively due to severe bacterial sepsis or septic shock. (NOTE)       Sepsis PCT Algorithm           Lower Respiratory Tract                                      Infection PCT Algorithm    ----------------------------     ----------------------------  PCT < 0.25 ng/mL                PCT < 0.10 ng/mL          Strongly encourage             Strongly discourage   discontinuation of antibiotics     initiation of antibiotics    ----------------------------     -----------------------------       PCT 0.25 - 0.50 ng/mL            PCT 0.10 - 0.25 ng/mL               OR       >80% decrease in PCT            Discourage initiation of                                            antibiotics      Encourage discontinuation           of antibiotics    ----------------------------     -----------------------------         PCT >= 0.50 ng/mL              PCT 0.26 - 0.50 ng/mL                AND       <80% decrease in PCT             Encourage initiation of                                             antibiotics       Encourage continuation           of antibiotics    ----------------------------     -----------------------------        PCT >= 0.50 ng/mL                  PCT > 0.50 ng/mL               AND         increase in PCT                  Strongly encourage                                      initiation of antibiotics    Strongly encourage escalation           of antibiotics                                     -----------------------------                                           PCT <= 0.25 ng/mL  OR                                        > 80% decrease in PCT                                      Discontinue / Do not initiate                                             antibiotics  Performed at [facility], [address] [place], [place] [address]   Magnesium     Status: None   Collection Time: 06/11/20  5:30 AM  Result Value Ref Range   Magnesium 1.9 1.7 - 2.4 mg/dL    Comment: Performed at [facility], [address] [place], [place] [address]  Phosphorus     Status: Abnormal   Collection Time: 06/11/20  5:30 AM  Result Value Ref Range   Phosphorus 4.8 (H) 2.5 - 4.6 mg/dL    Comment: Performed at [facility], [address] [place], [place] [address]  Brain natriuretic peptide     Status:  Abnormal   Collection Time: 06/11/20  5:30 AM  Result Value Ref Range   B Natriuretic Peptide 324.2 (H) 0.0 - 100.0 pg/mL    Comment: Performed at [facility], [address] [place], [place] [address]  MRSA PCR Screening     Status: None   Collection Time: 06/11/20  1:43 PM   Specimen: Nasopharyngeal  Result Value Ref Range   MRSA by PCR NEGATIVE NEGATIVE    Comment:        The GeneXpert MRSA Assay (FDA approved for NASAL specimens only), is one component of a comprehensive MRSA colonization surveillance program. It is not intended to diagnose MRSA infection nor to guide or monitor treatment for MRSA infections. Performed at [facility], [address] [place], [place] [address]    DG Chest 1 View  Result Date: 06/11/2020 CLINICAL DATA:  Shortness of breath EXAM: CHEST  1 VIEW COMPARISON:  June 09, 2020 FINDINGS: Central catheter tip is in the superior vena cava. No pneumothorax. There is cardiomegaly with pulmonary vascularity within normal limits. There is airspace opacity in the left lower lobe. Elsewhere there is interstitial prominence in the mid lung regions. No appreciable adenopathy. No bone lesions. IMPRESSION: Airspace opacity consistent with pneumonia in the left lower lung region and to a lesser extent in each mid lung region. Appearance indicative of multifocal pneumonia. Heart remains prominent with pulmonary vascularity within normal limits. No adenopathy. Central catheter tip in superior vena cava without pneumothorax. Electronically Signed   By: Lowella Grip III M.D.   On: 06/11/2020 08:34   NM Pulmonary Perfusion  Result Date: 06/11/2020 CLINICAL DATA:  Shortness of breath EXAM: NUCLEAR MEDICINE PERFUSION LUNG SCAN TECHNIQUE: Perfusion images were obtained in multiple projections after intravenous injection of radiopharmaceutical. Views: Anterior, posterior, left lateral, right lateral, RPO, LPO, RAO, LAO RADIOPHARMACEUTICALS:  4.37  mCi Tc-12mMAA IV COMPARISON:  Chest radiograph June 11, 2020 FINDINGS: There is slight decreased radiotracer uptake in each apex, nonsegmental. Elsewhere, the distribution of uptake on the perfusion study is normal. IMPRESSION:  Slightly decreased radiotracer uptake in the apex regions, nonsegmental, a finding of questionable significance. This study is considered low probability for pulmonary embolus. Electronically Signed   By: Lowella Grip III M.D.   On: 06/11/2020 12:02    Assessment:  Ruben [name] is a 74 y.o. 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 He was admitted with weakness and fever suggestive of SIRS/febrile illness.  CXR on 06/11/2020 revealed airspace opacity c/w pneumonia in the left lower lung and to lesser extent in the mid lung region suggestive of multi-focal pneumonia.  Symptomatically, he remains weak.  He is off oxygen.  Plan: 1. Acute myelogenous leukemia (AML)  Patient is currently day 22 of cycle #2 venetoclax and azacitidine.  Hematocrit 27.3, hemoglobin 8.7, platelets 185,000, WBC 6900 with an ANC of 4300.  Review plan for bone marrow on 06/13/2020.  Bone marrow was scheduled at [facility].   Message sent to Dr. Evelene Croon regarding bone marrow at [facility] tomorrow.  Continue to hold venetoclax.  Encouraged by current counts and suspect upcoming bone marrow will show improvement.  Patient will be making a decision regarding future treatment depending on his bone marrow. 2. SIRS/febrile illness  Cultures negative.  Patient on cefepime and Flagyl. 3. Volume overload, resolved  Patient transiently on oxygen.  CXR on 06/09/2020 revealing diffuse hazy perihilar lung opacities suggestive of pulmonary edema.   Patient responded well to diuresis.  CXR on 06/11/2020 suggestive of multi-focal pneumonia. 4.   Disposition  Patient is very weak.  He describes sitting in the chair today and  working with PT today.  We discussed future discharge plans including home with home health vs a skilled nursing facility.  He notes that some of his decision is based on the bone marrow results.  He is unsure if his wife can care for him at home.  Appreciate palliative care consult. 5.   Code status  DNI/DNI    Lequita Asal, MD  06/11/2020, 5:33 PM

## 2020-06-11 NOTE — Progress Notes (Signed)
PROGRESS NOTE    Patient: Ruben [name]                            PCP: [name], MD                    DOB: 09-Nov-1945            DOA: 06/08/2020 [id number]             DOS: 06/11/2020, 10:37 AM   LOS: 2 days   Date of Service: The patient was seen and examined on 06/11/2020  Subjective:   The patient was seen and examined, remained stable on supplemental oxygen this morning Denies any chest pain or shortness of breath Foley cath in place -good urine output noted Hemodynamically stable (potassium 3.1, elevated creatinine 1.71 this a.m.)  Brief Narrative:   Ruben [name] is a 74 y.o. 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  ED Course:  Initial vitals showed BP 138/85, pulse 115, RR 18, temp 100.9 Fahrenheit, SPO2 96% on room air. WBC 6.7, creatinine 1.41, lactic acid 2.2, procalcitonin 5.82. UA -clear,  SARS-CoV-2 PCR is negative.   TB chest x-ray shows right IJ central line, cardiomegaly and pulmonary vascular congestion    Assessment & Plan:   Principal Problem:   SIRS (systemic inflammatory response syndrome) (HCC) Active Problems:   BPH (benign prostatic hyperplasia)   Crohn disease (HCC)   Iron deficiency anemia   CKD (chronic kidney disease) stage 3, GFR 30-59 ml/min   Acute myeloid leukemia not having achieved remission (HCC)   Sepsis (HCC)   Brief acute respiratory failure -Much improved-monitoring closely, currently on 2 L of oxygen, satting 100%  -Acute respiratory failure likely due to volume overload -IV fluid given to the sepsis, sepsis protocol without bolus -on 06/09/2020 patient developed hypoxia, tachypnea, tachycardia, T-max of 100.4 Was thought was due to volume overload, patient was anxious,  small dose of 0.5 Ativan given, patient was placed on nonrebreather mask, 40 mg of Lasix was given Patient responded well to treatment, currently has been weaned off oxygen, vitals are stable -Currently hemodynamically stable ICU critical pulmonary was consulted, his oncologist team was notified   Sepsis/SIRS - POA -upper respiratory symptoms (immunocompromised) -patient is vaccinated, on admission SARS-CoV-2 screen negative Brief acute respiratory failure 06/09/2020, T-max 100.4  -Cultures has been negative to date, no leukocytosis, afebrile due to elevated creatinine we will discontinue vancomycin and Valtrex for now. Continue IV cefepime and Flagyl   -Sepsis/SIRS etiology resolved--temp this a.m. 98.6, WBC 8.0, lactic acid 1.5, creatinine 1.60  -Patient met the sepsis criteria on admission with temp of 100.9, tachypneic with respiratory rate of 23, tachycardic heart rate as high as 126,, creatinine elevated to 1.41 (immunocompromised patient) -Blood and urine cultures been obtained --started on broad-spectrum antibiotics   -CXR shows improved aeration compared to previous.  Urinalysis is not convincing for UTI.      - He has a central line in place right chest.  May also be fever of malignancy.  -We will f/up with Blood and Crine cultures,  monitor strict I/O's as he developed volume overload on last admission  -Initiating mild gentle IV fluid hydration 50 mL/hour x4 hours.  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 Mike Gip.   -On chemotherapy with azacitidine and venetoclax. -on  venetoclax   Anemia of chronic disease and iron deficiency: -Stable monitoring H&H   Has been requiring frequent transfusions as an outpatient.    CKD stage IIIb: - Cr.  1.37, 1.41 >>  1.29 >>1.60 >> 1.71 today  Chronic appears stable.  Continue monitor.  Crohn's  disease: Has chronic intermittent diarrhea.   Stable no signs of flareup  BPH with chronic urinary retention: -Stable Patient self catheterizes.  Generalized weakness: Request PT/OT eval.   Ethics: DNR-patient confirms After reviewing medical records, ongoing cancer, immunocompromise, presenting with source, sepsis ruled out.  Nevertheless due to comorbidities, patient frailty and age anticipated prognosis to be poor. Palliative care consult -determine goal of care    DVT prophylaxis: Subcutaneous heparin Code Status: DNR, confirmed with patient on admission Family Communication: Discussed with patient, he has discussed with family Disposition Plan: From home, dispo pending further infectious work-up and PT/OT eval Consults called: None Admission status:    Status is - Inpatient  Misty status of inpatient due to sepsis etiology, requiring IV antibiotics, fluids, acute respiratory failure, acute renal injury, needing input from consultants... Needing intervention for hemodynamic stability   Dispo: The patient is from: Home  Anticipated d/c is to: Home  Anticipated d/c date is: 3 days  Patient currently is not medically stable to d/c.        Nutritional status:        Cultures; Blood Cultures x 2 >> NGT Urine Culture  >>> NGT    Antimicrobials: 06/08/2020 vancomycin/cefepime>>     Consultants: Palliative care     Procedures:   No admission procedures for hospital encounter.     Antimicrobials:  Anti-infectives (From admission, onward)   Start     Dose/Rate Route Frequency Ordered Stop   06/09/20 2200  vancomycin (VANCOREADY) IVPB 750 mg/150 mL  Status:  Discontinued        750 mg 150 mL/hr over 60 Minutes Intravenous Every 12 hours 06/09/20 2153 06/11/20 0814   06/09/20 1000  valACYclovir (VALTREX) tablet 500 mg  Status:  Discontinued        500 mg Oral Daily 06/08/20 2123 06/11/20 0814   06/09/20 1000   vancomycin (VANCOREADY) IVPB 750 mg/150 mL  Status:  Discontinued        750 mg 150 mL/hr over 60 Minutes Intravenous Every 12 hours 06/08/20 2154 06/09/20 2152   06/09/20 0700  ceFEPIme (MAXIPIME) 2 g in sodium chloride 0.9 % 100 mL IVPB        2 g 200 mL/hr over 30 Minutes Intravenous Every 12 hours 06/08/20 2154     06/08/20 2200  metroNIDAZOLE (FLAGYL) IVPB 500 mg        500 mg 100 mL/hr over 60 Minutes Intravenous Every 8 hours 06/08/20 2121     06/08/20 2100  vancomycin (VANCOREADY) IVPB 1750 mg/350 mL        1,750 mg 175 mL/hr over 120 Minutes Intravenous  Once 06/08/20 1921 06/08/20 2316   06/08/20 1900  ceFEPIme (MAXIPIME) 2 g in sodium chloride 0.9 % 100 mL IVPB        2 g 200 mL/hr over 30 Minutes Intravenous  Once 06/08/20 1854 06/08/20 1951       Medication:  . allopurinol  150 mg Oral Daily  . amLODipine  5 mg Oral Daily  .  Chlorhexidine Gluconate Cloth  6 each Topical Daily  . enoxaparin (LOVENOX) injection  40 mg Subcutaneous Q24H  . finasteride  5 mg Oral QPM  . folic acid  1 mg Oral [id number]  . latanoprost  1 drop Both Eyes QHS  . mouth rinse  15 mL Mouth Rinse BID  . timolol  1 drop Both Eyes Daily  . venetoclax  100 mg Oral Daily  . vitamin B-12  1,000 mcg Oral Q1200    sodium chloride, acetaminophen **OR** acetaminophen, benzonatate, hydrALAZINE, LORazepam, ondansetron **OR** ondansetron (ZOFRAN) IV   Objective:   Vitals:   06/10/20 1605 06/10/20 2008 06/11/20 0440 06/11/20 0954  BP: 132/71 134/77 126/73 129/71  Pulse: 98 95 80   Resp: _0 Temp: 98.5 F (36.9 C) 99.2 F (37.3 C) 97.7 F (36.5 C)   TempSrc: Oral Oral Oral   SpO2: 99% 99% 100%   Weight:      Height:        Intake/Output Summary (Last 24 hours) at 06/11/2020 1037 Last data filed at 06/11/2020 0400 Gross per 24 hour  Intake 845.27 ml  Output 975 ml  Net -129.73 ml   Filed Weights   06/08/20 1703  Weight: 70.8 kg     Examination:       Physical Exam:     General:   Much more awake alert this morning, following,, on supplemental oxygen 2 L  HEENT:  Normocephalic, PERRL, otherwise with in Normal limits   Neuro:  CNII-XII intact. , normal motor and sensation, reflexes intact   Lungs:   Clear to auscultation BL, Respirations unlabored, no wheezes / crackles  Cardio:    S1/S2, RRR, No murmure, No Rubs or Gallops   Abdomen:   Soft, non-tender, bowel sounds active all four quadrants,  no guarding or peritoneal signs.  Muscular skeletal:  Limited exam - in bed, able to move all 4 extremities, Normal strength,  2+ pulses,  symmetric, No pitting edema  Skin:  Dry, warm to touch, negative for any Rashes, No open wounds  Wounds: Please see nursing documentation Pressure Injury (Active)     Location:   Location Orientation:   Staging:   Wound Description (Comments):   Present on Admission:            ------------------------------------------------------------------------------------------------------------------------------------------    LABs:  CBC Latest Ref Rng & Units 06/11/2020 06/10/2020 06/09/2020  WBC 4.0 - 10.5 K/uL 6.9 8.0 5.6  Hemoglobin 13.0 - 17.0 g/dL 8.7(L) 9.1(L) 8.8(L)  Hematocrit 39 - 52 % 27.3(L) 28.6(L) 25.9(L)  Platelets 150 - 400 K/uL 185 206 159   CMP Latest Ref Rng & Units 06/11/2020 06/10/2020 06/09/2020  Glucose 70 - 99 mg/dL 85 88 84  BUN 8 - 23 mg/dL 38(H) 35(H) 24(H)  Creatinine 0.61 - 1.24 mg/dL 1.71(H) 1.60(H) 1.29(H)  Sodium 135 - 145 mmol/L 131(L) 132(L) 130(L)  Potassium 3.5 - 5.1 mmol/L 3.1(L) 3.6 3.3(L)  Chloride 98 - 111 mmol/L 101 100 98  CO2 22 - 32 mmol/L _1 Calcium 8.9 - 10.3 mg/dL 8.8(L) 8.7(L) 8.3(L)  Total Protein 6.5 - 8.1 g/dL - - -  Total Bilirubin 0.3 - 1.2 mg/dL - - -  Alkaline Phos 38 - 126 U/L - - -  AST 15 - 41 U/L - - -  ALT 0 - 44 U/L - - -       Micro Results Recent Results (from the past 240 hour(s))  Culture, blood (Routine X 2)  w Reflex to ID Panel     Status: None  (Preliminary result)   Collection Time: 06/08/20  3:14 PM   Specimen: BLOOD  Result Value Ref Range Status   Specimen Description   Final    BLOOD RIGHT ARM Performed at [facility], [address] [place], [place] [address]    Special Requests   Final    BOTTLES DRAWN AEROBIC AND ANAEROBIC Blood Culture adequate volume Performed at [facility], [address], [place], [place] [address]    Culture   Final    NO GROWTH 3 DAYS Performed at [facility], [address] [place], [place] [address]    Report Status PENDING  Incomplete  Culture, blood (Routine X 2) w Reflex to ID Panel     Status: None (Preliminary result)   Collection Time: 06/08/20  3:33 PM   Specimen: BLOOD  Result Value Ref Range Status   Specimen Description   Final    BLOOD LEFT ARM Performed at [facility], [address] [place], [place] [address]    Special Requests   Final    BOTTLES DRAWN AEROBIC AND ANAEROBIC Blood Culture adequate volume Performed at [facility], [address] [place], [place] [address]    Culture   Final    NO GROWTH 3 DAYS Performed at [facility], [address], [place], [place] [address]    Report Status PENDING  Incomplete  SARS Coronavirus 2 by RT PCR (hospital order, performed in [facility] hospital lab) Nasopharyngeal Nasopharyngeal Swab     Status: None   Collection Time: 06/08/20  5:23 PM   Specimen: Nasopharyngeal Swab  Result Value Ref Range Status   SARS Coronavirus 2 NEGATIVE NEGATIVE Final    Comment: (NOTE) SARS-CoV-2 target nucleic acids are NOT DETECTED.  The SARS-CoV-2 RNA is generally detectable in upper and lower respiratory specimens during the acute phase of infection. The lowest concentration of SARS-CoV-2 viral copies this assay can detect is 250 copies / mL. A negative result does not preclude SARS-CoV-2 infection and should not be used as the sole basis for treatment or other patient management  decisions.  A negative result may occur with improper specimen collection / handling, submission of specimen other than nasopharyngeal swab, presence of viral mutation(s) within the areas targeted by this assay, and inadequate number of viral copies (<250 copies / mL). A negative result must be combined with clinical observations, patient history, and epidemiological information.  Fact Sheet for Patients:   StrictlyIdeas.no  Fact Sheet for Healthcare Providers: BankingDealers.co.za  This test is not yet approved or  cleared by the [place] FDA and has been authorized for detection and/or diagnosis of SARS-CoV-2 by FDA under an Emergency Use Authorization (EUA).  This EUA will remain in effect (meaning this test can be used) for the duration of the COVID-19 declaration under Section 564(b)(1) of the Act, 21 U.S.C. section 360bbb-3(b)(1), unless the authorization is terminated or revoked sooner.  Performed at [facility], [address], [place], [place] [address]   Urine culture     Status: None   Collection Time: 06/08/20  5:23 PM   Specimen: Urine, Random  Result Value Ref Range Status   Specimen Description   Final    URINE, RANDOM Performed at [facility], [address] [place], [place] [address]    Special Requests   Final    NONE Performed at [facility], [address] [place], [place] [address]    Culture   Final    NO GROWTH Performed at [facility], [address], [place], [place] [address]    Report Status 06/10/2020 FINAL  Final    Radiology Reports DG Chest 1 View  Result Date: 06/11/2020 CLINICAL DATA:  Shortness of breath EXAM: CHEST  1 VIEW COMPARISON:  June 09, 2020 FINDINGS: Central catheter tip is in the superior vena cava. No pneumothorax. There is cardiomegaly with pulmonary vascularity within normal limits. There is airspace opacity in the left lower  lobe. Elsewhere there is interstitial prominence in the mid lung regions. No appreciable adenopathy. No bone lesions. IMPRESSION: Airspace opacity consistent with pneumonia in the left lower lung region and to a lesser extent in each mid lung region. Appearance indicative of multifocal pneumonia. Heart remains prominent with pulmonary vascularity within normal limits. No adenopathy. Central catheter tip in superior vena cava without pneumothorax. Electronically Signed   By: Lowella Grip III M.D.   On: 06/11/2020 08:34   DG Chest 2 View  Result Date: 06/08/2020 CLINICAL DATA:  Chronic cough, shortness of breath EXAM: CHEST - 2 VIEW COMPARISON:  Prior chest x-ray 04/26/2020 FINDINGS: Tunneled right IJ central venous catheter present. The tip overlies the superior cavoatrial junction. Stable mild cardiomegaly. Mild pulmonary vascular congestion without overt edema. Significantly improved aeration compared to the prior radiograph. Small layering left pleural effusion with associated atelectasis. Stable chronic bronchitic changes and areas of linear scarring. No pneumothorax. No focal airspace infiltrate. IMPRESSION: 1. Overall, significantly improved aeration compared to prior imaging from 04/26/2020. 2. Stable cardiomegaly and pulmonary vascular congestion without overt edema. 3. Small left pleural effusion with associated atelectasis. 4. Tunneled right IJ central line in good position. Electronically Signed   By: Jacqulynn Cadet M.D.   On: 06/08/2020 14:04   US Venous Img Lower Bilateral (DVT)  Result Date: 06/09/2020 CLINICAL DATA:  Bilateral lower extremity pain 1 week. EXAM: BILATERAL LOWER EXTREMITY VENOUS DOPPLER ULTRASOUND TECHNIQUE: Gray-scale sonography with compression, as well as color and duplex ultrasound, were performed to evaluate the deep venous system(s) from the level of the common femoral vein through the popliteal and proximal calf veins. COMPARISON:  None. FINDINGS: VENOUS Normal  compressibility of the common femoral, superficial femoral, and popliteal veins, as well as the visualized calf veins bilaterally. Visualized portions of profunda femoral vein and great saphenous vein unremarkable bilaterally. No filling defects to suggest DVT on grayscale or color Doppler imaging. Doppler waveforms show normal direction of venous flow, normal respiratory plasticity and response to augmentation bilaterally. OTHER None. Limitations: none IMPRESSION: Negative bilateral lower extremity Doppler evaluation. Electronically Signed   By: Marin Olp M.D.   On: 06/09/2020 17:09   IR Fluoro Guide CV Line Left  Result Date: 05/18/2020 CLINICAL DATA:  AML, needs venous access for treatment regimen. Renal insufficiency. EXAM: TUNNELED CENTRAL VENOUS CATHETER PLACEMENT WITH ULTRASOUND AND FLUOROSCOPIC GUIDANCE TECHNIQUE: The procedure, risks, benefits, and alternatives were explained to the patient. Questions regarding the procedure were encouraged and answered. The patient understands and consents to the procedure. Patency of the right IJ vein was confirmed with ultrasound with image documentation. An appropriate skin site was determined. Region was prepped using maximum barrier technique including cap and mask, sterile gown, sterile gloves, large sterile sheet, and Chlorhexidine as cutaneous antisepsis. The region was infiltrated locally with 1% lidocaine. Under real-time ultrasound guidance, the right IJ vein was accessed with a 21 gauge micropuncture needle; the needle tip within  the vein was confirmed with ultrasound image documentation. 70F single-lumen cuffed PowerLine tunneled from a right anterior chest wall approach to the dermatotomy site. Needle exchanged over the 018 guidewire for transitional dilator, through which the catheter which had been cut to 22 cm was advanced under intermittent fluoroscopy, positioned with its tip at the cavoatrial junction. Spot chest radiograph confirms good catheter  position. No pneumothorax. Catheter was flushed per protocol. Catheter secured externally with O Prolene suture. The right IJ dermatotomy site was closed with Dermabond. COMPLICATIONS: COMPLICATIONS None immediate FLUOROSCOPY TIME:  Less than 0.1 minute; 0.25 mGy COMPARISON:  None IMPRESSION: 1. Technically successful placement of tunneled right IJ tunneled single-lumen power injectable catheter with ultrasound and fluoroscopic guidance. Ready for routine use. Electronically Signed   By: Lucrezia Europe M.D.   On: 05/18/2020 11:33   DG Chest Port 1 View  Result Date: 06/09/2020 CLINICAL DATA:  Dyspnea, tachypnea, tachycardia EXAM: PORTABLE CHEST 1 VIEW COMPARISON:  06/08/2020 chest radiograph. FINDINGS: Right internal jugular central venous catheter terminates in the lower third of the SVC. Stable cardiomediastinal silhouette with mild cardiomegaly. No pneumothorax. Small left pleural effusion is unchanged. No significant right pleural effusion. Diffuse hazy parahilar interstitial lung opacities, increased. IMPRESSION: 1. Stable mild cardiomegaly. Diffuse hazy parahilar interstitial lung opacities, increased, favor pulmonary edema. 2. Stable small left pleural effusion. Electronically Signed   By: Ilona Sorrel M.D.   On: 06/09/2020 15:21    SIGNED: Deatra Khiry, MD, FACP, FHM. [facility],  Pager (please use [email or web address] to page/text)  If 7PM-7AM, please contact night-coverage [email or web address] [email or web address] [facility] 06/11/2020, 10:37 AM

## 2020-06-12 ENCOUNTER — Inpatient Hospital Stay: Payer: Medicare Other

## 2020-06-12 ENCOUNTER — Other Ambulatory Visit: Payer: Self-pay

## 2020-06-12 LAB — CBC WITH DIFFERENTIAL/PLATELET
Abs Immature Granulocytes: 0.87 10*3/uL — ABNORMAL HIGH (ref 0.00–0.07)
Basophils Absolute: 0.2 10*3/uL — ABNORMAL HIGH (ref 0.0–0.1)
Basophils Relative: 3 %
Eosinophils Absolute: 0 10*3/uL (ref 0.0–0.5)
Eosinophils Relative: 0 %
HCT: 27.7 % — ABNORMAL LOW (ref 39.0–52.0)
Hemoglobin: 8.9 g/dL — ABNORMAL LOW (ref 13.0–17.0)
Immature Granulocytes: 14 %
Lymphocytes Relative: 11 %
Lymphs Abs: 0.7 10*3/uL (ref 0.7–4.0)
MCH: 28.1 pg (ref 26.0–34.0)
MCHC: 32.1 g/dL (ref 30.0–36.0)
MCV: 87.4 fL (ref 80.0–100.0)
Monocytes Absolute: 0.6 10*3/uL (ref 0.1–1.0)
Monocytes Relative: 9 %
Neutro Abs: 4 10*3/uL (ref 1.7–7.7)
Neutrophils Relative %: 63 %
Platelets: 210 10*3/uL (ref 150–400)
RBC: 3.17 MIL/uL — ABNORMAL LOW (ref 4.22–5.81)
RDW: 18.7 % — ABNORMAL HIGH (ref 11.5–15.5)
Smear Review: NORMAL
WBC: 6.3 10*3/uL (ref 4.0–10.5)
nRBC: 2.9 % — ABNORMAL HIGH (ref 0.0–0.2)

## 2020-06-12 LAB — BASIC METABOLIC PANEL
Anion gap: 5 (ref 5–15)
BUN: 31 mg/dL — ABNORMAL HIGH (ref 8–23)
CO2: 22 mmol/L (ref 22–32)
Calcium: 9.2 mg/dL (ref 8.9–10.3)
Chloride: 103 mmol/L (ref 98–111)
Creatinine, Ser: 1.46 mg/dL — ABNORMAL HIGH (ref 0.61–1.24)
GFR calc Af Amer: 55 mL/min — ABNORMAL LOW (ref >60)
GFR calc non Af Amer: 47 mL/min — ABNORMAL LOW (ref >60)
Glucose, Bld: 87 mg/dL (ref 70–99)
Potassium: 2.9 mmol/L — ABNORMAL LOW (ref 3.5–5.1)
Sodium: 130 mmol/L — ABNORMAL LOW (ref 135–145)

## 2020-06-12 LAB — PROCALCITONIN: Procalcitonin: 105.1 ng/mL

## 2020-06-12 MED ORDER — MIDAZOLAM HCL 2 MG/2ML IJ SOLN
INTRAMUSCULAR | Status: AC
  Filled 2020-06-12: qty 2

## 2020-06-12 MED ORDER — FENTANYL CITRATE (PF) 100 MCG/2ML IJ SOLN
INTRAMUSCULAR | Status: DC | PRN
  Administered 2020-06-12: 50 ug via INTRAVENOUS

## 2020-06-12 MED ORDER — FENTANYL CITRATE (PF) 100 MCG/2ML IJ SOLN
INTRAMUSCULAR | Status: AC
  Filled 2020-06-12: qty 2

## 2020-06-12 MED ORDER — POTASSIUM CHLORIDE CRYS ER 20 MEQ PO TBCR
40.0000 meq | EXTENDED_RELEASE_TABLET | Freq: Once | ORAL | Status: AC
  Administered 2020-06-12: 40 meq via ORAL
  Filled 2020-06-12: qty 2

## 2020-06-12 MED ORDER — MIDAZOLAM HCL 2 MG/2ML IJ SOLN
INTRAMUSCULAR | Status: DC | PRN
  Administered 2020-06-12: 1 mg via INTRAVENOUS

## 2020-06-12 MED ORDER — HEPARIN SOD (PORK) LOCK FLUSH 100 UNIT/ML IV SOLN
INTRAVENOUS | Status: AC
  Filled 2020-06-12: qty 5

## 2020-06-12 NOTE — Progress Notes (Signed)
Patient clinically stable post BMB per Dr Kathlene Cote, tolerated well. Vitals stable pre and post procedure. Denies complaints at this time. Wife at bedside upon return to room , report given to [facility] post procedure/recovery.

## 2020-06-12 NOTE — Progress Notes (Signed)
Referring Physician(s): Corcoran,M  Supervising Physician: Aletta Edouard  Patient Status:  [facility] - In-pt  Chief Complaint:  Weakness, leukemia, cough  Subjective: Patient familiar to IR service from prior bone marrow biopsies in 2017 and most recently on 02/28/2020.  He is status post tunneled right IJ central venous catheter ablation by our service on 05/18/2020.  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  He was recently admitted to [facility] with weakness , fever and pneumonia.  COVID-19 negative.  Request now received for CT-guided bone marrow biopsy for further evaluation.  He currently denies headache, chest pain, dyspnea, abdominal pain, back pain, nausea, vomiting or bleeding.   Past Medical History:  Diagnosis Date  . Blood dyscrasia   . BPH (benign prostatic hyperplasia)   . Cancer ([place])    SKIN/ POLYCYTHEMIA VERA  . Chronic kidney disease    RENAL INSUFF (40%)  . Crohn disease ([place])   . Crohn's disease ([place])   . Glaucoma   . Glaucoma   . Gout   . History of chicken pox   . Myelofibrosis ([place])   . Nosebleed   . RBBB   . Right bundle branch block    Past Surgical History:  Procedure Laterality Date  . CATARACT EXTRACTION W/PHACO Left 07/13/2018   Procedure: CATARACT EXTRACTION PHACO AND INTRAOCULAR LENS PLACEMENT (IOC);  Surgeon: [name], MD;  Location: [facility] ORS;  Service: Ophthalmology;  Laterality: Left;  [place] 00:39  CDE 5.14 Fluid pack lot # [id number]  . CATARACT EXTRACTION W/PHACO Right 08/03/2018   Procedure: CATARACT EXTRACTION PHACO AND INTRAOCULAR LENS PLACEMENT (IOC);  Surgeon: [name], MD;  Location: [facility] ORS;  Service: Ophthalmology;  Laterality: Right;  [place] 00:43.4 CDE 5.23 Fluid pack Lot # [id number]  . COLONOSCOPY WITH PROPOFOL N/A 03/27/2017   Procedure: COLONOSCOPY WITH PROPOFOL;  Surgeon: [name], MD;  Location: [facility] ENDOSCOPY;  Service: Endoscopy;  Laterality: N/A;  . IR FLUORO  GUIDE CV LINE LEFT  05/18/2020  . MOHS SURGERY     EAR  . PROSTATE SURGERY  2008   Prostate Biopsy in [place] to elevated PSA.  per patient normal  . Skin Lesion Basal cell removed    . wart removal     from eyelid     Allergies: Patient has no known allergies.  Medications: Prior to Admission medications   Medication Sig Start Date End Date Taking? Authorizing Provider  allopurinol (ZYLOPRIM) 300 MG tablet TAKE 1/2 TABLET (150 MG TOTAL) BY MOUTH DAILY. 12/05/19  Yes Corcoran, Drue Second, MD  folic acid (FOLVITE) 1 MG tablet Take 1 mg by mouth daily at 12 noon.  03/20/15  Yes [provider]  levofloxacin (LEVAQUIN) 500 MG tablet Take 250 mg by mouth daily.    Yes [provider]  midodrine (PROAMATINE) 5 MG tablet Take 1 tablet (5 mg total) by mouth 3 (three) times daily with meals. 04/30/20  Yes Jennye Boroughs, MD  ondansetron (ZOFRAN-ODT) 4 MG disintegrating tablet Take 1 tablet (4 mg total) by mouth every 8 (eight) hours as needed for nausea or vomiting. 06/01/20  Yes Borders, Kirt Boys, NP  posaconazole (NOXAFIL) 100 MG TBEC delayed-release tablet Take 300 mg by mouth daily.    Yes [provider]  potassium chloride SA (KLOR-CON M20) 20 MEQ tablet Take 1 tablet (20 mEq total) by mouth every other day. Patient taking every other day 06/02/20  Yes Lequita Asal, MD  sodium bicarbonate 650 MG  tablet Take 650 mg by mouth 2 (two) times daily. 06/06/17  Yes [provider]  timolol (TIMOPTIC) 0.5 % ophthalmic solution Place 1 drop into both eyes daily. In AM 05/27/18  Yes [provider]  valACYclovir (VALTREX) 500 MG tablet Take 500 mg by mouth daily.   Yes [provider]  VENCLEXTA 100 MG TABS Take 100 mg by mouth daily.  05/24/20  Yes [provider]  acetaminophen (TYLENOL) 500 MG tablet Take 500 mg by mouth daily as needed for moderate pain. Patient not taking: Reported on 06/04/2020    [provider]    benzonatate (TESSALON) 100 MG capsule Take 1 capsule (100 mg total) by mouth 2 (two) times daily as needed for cough. Patient not taking: Reported on 06/09/2020 06/08/20   Borders, Kirt Boys, NP  Cyanocobalamin (RA VITAMIN B-12 TR) 1000 MCG TBCR Take 1,000 mcg by mouth daily at 12 noon.     [provider]  cyanocobalamin 1000 MCG tablet Take by mouth.     [provider]  finasteride (PROSCAR) 5 MG tablet Take 1 tablet (5 mg total) by mouth every evening. 11/08/19   Hollice Espy, MD  latanoprost (XALATAN) 0.005 % ophthalmic solution Place 1 drop into both eyes at bedtime.     [provider]     Vital Signs: BP 123/68 (BP Location: Left Arm)   Pulse 82   Temp (!) 97.4 F (36.3 C) (Oral)   Resp 20   Ht 6' (1.829 m)   Wt 157 lb 6.5 oz (71.4 kg)   SpO2 99%   BMI 21.35 kg/m   Physical Exam awake , answers simple questions okay; chest-slightly diminished breath sounds  left base, right clear.  Heart with normal rate, irregular rhythm.  Abdomen soft, positive bowel sounds, NT;   trace pretibial edema bilaterally.  Imaging: DG Chest 1 View  Result Date: 06/11/2020 CLINICAL DATA:  Shortness of breath EXAM: CHEST  1 VIEW COMPARISON:  June 09, 2020 FINDINGS: Central catheter tip is in the superior vena cava. No pneumothorax. There is cardiomegaly with pulmonary vascularity within normal limits. There is airspace opacity in the left lower lobe. Elsewhere there is interstitial prominence in the mid lung regions. No appreciable adenopathy. No bone lesions. IMPRESSION: Airspace opacity consistent with pneumonia in the left lower lung region and to a lesser extent in each mid lung region. Appearance indicative of multifocal pneumonia. Heart remains prominent with pulmonary vascularity within normal limits. No adenopathy. Central catheter tip in superior vena cava without pneumothorax. Electronically Signed   By: Lowella Grip III M.D.   On: 06/11/2020 08:34   DG  Chest 2 View  Result Date: 06/08/2020 CLINICAL DATA:  Chronic cough, shortness of breath EXAM: CHEST - 2 VIEW COMPARISON:  Prior chest x-ray 04/26/2020 FINDINGS: Tunneled right IJ central venous catheter present. The tip overlies the superior cavoatrial junction. Stable mild cardiomegaly. Mild pulmonary vascular congestion without overt edema. Significantly improved aeration compared to the prior radiograph. Small layering left pleural effusion with associated atelectasis. Stable chronic bronchitic changes and areas of linear scarring. No pneumothorax. No focal airspace infiltrate. IMPRESSION: 1. Overall, significantly improved aeration compared to prior imaging from 04/26/2020. 2. Stable cardiomegaly and pulmonary vascular congestion without overt edema. 3. Small left pleural effusion with associated atelectasis. 4. Tunneled right IJ central line in good position. Electronically Signed   By: Jacqulynn Cadet M.D.   On: 06/08/2020 14:04   NM Pulmonary Perfusion  Result Date: 06/11/2020 CLINICAL DATA:  Shortness of breath EXAM: NUCLEAR MEDICINE PERFUSION LUNG SCAN TECHNIQUE: Perfusion images were obtained in multiple projections after intravenous injection of radiopharmaceutical. Views: Anterior, posterior, left lateral, right lateral, RPO, LPO, RAO, LAO RADIOPHARMACEUTICALS:  4.37 mCi Tc-51mMAA IV COMPARISON:  Chest radiograph June 11, 2020 FINDINGS: There is slight decreased radiotracer uptake in each apex, nonsegmental. Elsewhere, the distribution of uptake on the perfusion study is normal. IMPRESSION: Slightly decreased radiotracer uptake in the apex regions, nonsegmental, a finding of questionable significance. This study is considered low probability for pulmonary embolus. Electronically Signed   By: WLowella GripIII M.D.   On: 06/11/2020 12:02   [place] Img Lower Bilateral (DVT)  Result Date: 06/09/2020 CLINICAL DATA:  Bilateral lower extremity pain 1 week. EXAM: BILATERAL LOWER  EXTREMITY VENOUS DOPPLER ULTRASOUND TECHNIQUE: Gray-scale sonography with compression, as well as color and duplex ultrasound, were performed to evaluate the deep venous system(s) from the level of the common femoral vein through the popliteal and proximal calf veins. COMPARISON:  None. FINDINGS: VENOUS Normal compressibility of the common femoral, superficial femoral, and popliteal veins, as well as the visualized calf veins bilaterally. Visualized portions of profunda femoral vein and great saphenous vein unremarkable bilaterally. No filling defects to suggest DVT on grayscale or color Doppler imaging. Doppler waveforms show normal direction of venous flow, normal respiratory plasticity and response to augmentation bilaterally. OTHER None. Limitations: none IMPRESSION: Negative bilateral lower extremity Doppler evaluation. Electronically Signed   By: DMarin OlpM.D.   On: 06/09/2020 17:09   DG Chest Port 1 View  Result Date: 06/09/2020 CLINICAL DATA:  Dyspnea, tachypnea, tachycardia EXAM: PORTABLE CHEST 1 VIEW COMPARISON:  06/08/2020 chest radiograph. FINDINGS: Right internal jugular central venous catheter terminates in the lower third of the SVC. Stable cardiomediastinal silhouette with mild cardiomegaly. No pneumothorax. Small left pleural effusion is unchanged. No significant right pleural effusion. Diffuse hazy parahilar interstitial lung opacities, increased. IMPRESSION: 1. Stable mild cardiomegaly. Diffuse hazy parahilar interstitial lung opacities, increased, favor pulmonary edema. 2. Stable small left pleural effusion. Electronically Signed   By: JIlona SorrelM.D.   On: 06/09/2020 15:21    Labs:  CBC: Recent Labs    06/09/20 0417 06/10/20 0244 06/11/20 0530 06/12/20 0757  WBC 5.6 8.0 6.9 6.3  HGB 8.8* 9.1* 8.7* 8.9*  HCT 25.9* 28.6* 27.3* 27.7*  PLT 159 206 185 210    COAGS: Recent Labs    03/22/20 0947 04/25/20 1909 04/26/20 0508 06/09/20 0417  INR 1.2 1.3* 1.5* 1.7*    APTT 40*  --   --   --     BMP: Recent Labs    06/09/20 0417 06/10/20 0244 06/11/20 0530 06/12/20 0757  NA 130* 132* 131* 130*  K 3.3* 3.6 3.1* 2.9*  CL 98 100 101 103  CO2 23 22 22 22   GLUCOSE 84 88 85 87  BUN 24* 35* 38* 31*  CALCIUM 8.3* 8.7* 8.8* 9.2  CREATININE 1.29* 1.60* 1.71* 1.46*  GFRNONAA 55* 42* 39* 47*  GFRAA >60 49* 45* 55*    LIVER FUNCTION TESTS: Recent Labs    05/21/20 0849 05/24/20 1455 05/29/20 0819 06/04/20 1006  BILITOT 1.4* 1.4* 1.1 1.0  AST 42* 32 27 27  ALT 6 7 10 11   ALKPHOS 68 63 69 66  PROT 8.5* 8.4* 8.7* 9.1*  ALBUMIN 2.4* 2.3* 2.5* 2.6*    Assessment and Plan: 74yo male with 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  He was recently admitted  to [facility] with weakness , fever and pneumonia.  COVID-19 negative.  Request now received for CT-guided bone marrow biopsy for further evaluation. Risks and benefits of procedure was discussed with the patient /spouse including, but not limited to bleeding, infection, damage to adjacent structures or low yield requiring additional tests.  All of the questions were answered and there is agreement to proceed.  Consent signed and in chart.    Electronically Signed: D. Rowe Robert, PA-C 06/12/2020, 9:09 AM   I spent a total of 20 minutes at the the patient's bedside AND on the patient's hospital floor or unit, greater than 50% of which was counseling/coordinating care for CT-guided bone marrow biopsy    Patient ID: Ruben [name], male   DOB: Aug 29, 1946, 74 y.o.   MRN: [id number]

## 2020-06-12 NOTE — Procedures (Signed)
Interventional Radiology Procedure Note  Procedure: CT guided bone marrow aspiration and biopsy  Complications: None  EBL: < 10 mL  Findings: Aspirate and core biopsy performed of bone marrow in right iliac bone.  Plan: Bedrest supine x 1 hrs  Zamyra Allensworth T. Calieb Lichtman, M.D Pager:  [phone]   

## 2020-06-12 NOTE — Progress Notes (Signed)
°   06/12/20 1300  Clinical Encounter Type  Visited With Patient and family together;Other (Comment)  Visit Type Initial;Spiritual support  Referral From Family;Chaplain;Social work  Consult/Referral To Chaplain  while standing at the nurses' station pt's wife asked for a cup of ice and chaplain offered to get it. After getting the ice, Pt's wife asked chaplain about having once been in ICU and chaplain said yes. It was then chaplain realized who [name] was. After taking the ice the room, chaplain got a cup of water to put in the ice. Lovey Newcomer, the case manager welcomed chaplain to join in the conversation they were having. Pt's wife asked chaplain to sit with her at on the foot of the bed as [name], wife, and case manager discussed options available to them. The conversation went over for over 30 minutes. Although [name] is concerned about his wife, he admitted he does not want to live like this. After the conversation was over, The four of [place] held hands as chaplain prayed. Before leaving the room, Wife reached out and chaplain tightly hugged her. Chaplain made known her availability and she will follow up them in the morning.

## 2020-06-12 NOTE — Progress Notes (Signed)
Mobility Specialist - Progress Note   06/12/20 1325  Mobility  Activity Transferred:  Bed to chair;Ambulated in room  Level of Assistance Contact guard assist, steadying assist  Assistive Device Front wheel walker  Distance Ambulated (ft) 45 ft  Mobility Response Tolerated well  Mobility performed by Mobility specialist  $Mobility charge 1 Mobility    Pre-mobility: 94 HR, 98% SpO2 During mobility: 104 HR, 94% SpO2 Post-mobility: 94 HR, 97% SpO2   Pt long-sitting in bed upon arrival. Pt agreed to session. Pt alert and oriented. Pt transitioned to sitting EOB and S2S w/ SBA. Pt transferred from bed to chair CGA using a RW. Pt progressed to ambulating in room using a RW. No LOB noted. No c/o SOB. Pt attempted to ambulate another lap in room, but w/o AD. CGA utilized for safety. Pt tolerated ambulating w/o AD very well. Pt maintained slow and steady gait. However, noted LOB when turning around to sit on recliner w/o AD. LOB corrected by verbal cues and was encouraged to take his time. Overall, pt tolerated session very well. Pt left in recliner w/ alarm set. Call bell and phone placed in reach.     Dominick Morella Mobility Specialist  06/12/20, 1:32 PM

## 2020-06-12 NOTE — Care Management Important Message (Signed)
Important Message  Patient Details  Name: DEMARQUIS OSLEY MRN: [id number] Date of Birth: 03/10/1946   Medicare Important Message Given:  Yes  Initial Medicare IM given by Patient Access Associate on 06/11/2020 at 10:38am.     Dannette Barbara 06/12/2020, 8:35 AM

## 2020-06-12 NOTE — Progress Notes (Signed)
PROGRESS NOTE    Patient: Ruben [name]                            PCP: [name], MD                    DOB: 11-14-1945            DOA: 06/08/2020 VOJ:[id number]             DOS: 06/12/2020, 11:32 AM   LOS: 3 days   Date of Service: The patient was seen and examined on 06/12/2020  Subjective:   The patient was seen and examined this morning, stable, off oxygen, Awake alert no acute distress  Wife present at bedside Was seen by his primary oncologist last night Planning for bone biopsy today  Receptive to placement--SNF  Brief Narrative:   Ruben [name] is a 74 y.o. 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  ED Course:  Initial vitals showed BP 138/85, pulse 115, RR 18, temp 100.9 Fahrenheit, SPO2 96% on room air. WBC 6.7, creatinine 1.41, lactic acid 2.2, procalcitonin 5.82. UA -clear,  SARS-CoV-2 PCR is negative.   TB chest x-ray shows right IJ central line, cardiomegaly and pulmonary vascular congestion    Assessment & Plan:   Principal Problem:   SIRS (systemic inflammatory response syndrome) (HCC) Active Problems:   BPH (benign prostatic hyperplasia)   Crohn disease (HCC)   Iron deficiency anemia   CKD (chronic kidney disease) stage 3, GFR 30-59 ml/min   Acute myeloid leukemia not having achieved remission (HCC)   Sepsis ([place])   Protein-calorie malnutrition, severe   Palliative care encounter   Brief acute respiratory failure Acute respiratory distress x2 since admission... Stabilized now off supplemental oxygen On room air satting 100% -Stable now   -Acute respiratory failure likely due to volume overload -IV fluid given to the sepsis, sepsis protocol without bolus -on 06/09/2020 patient developed hypoxia,  tachypnea, tachycardia, T-max of 100.4 Was thought was due to volume overload, patient was anxious, small dose of 0.5 Ativan given, patient was placed on nonrebreather mask, 40 mg of Lasix was given Patient responded well to treatment, currently has been weaned off oxygen, vitals are stable     Sepsis/SIRS - POA -upper respiratory symptoms (immunocompromised)/likely pneumonia Chest x-ray imaging consistent with possible pneumonia -patient is vaccinated, on admission SARS-CoV-2 screen negative Brief acute respiratory failure 06/09/2020, 06/10/2020 (T-max 100.4)  -Improved sepsis physiology -Afebrile normotensive now Has been on IV antibiotics of vancomycin Valtrex,Flagyl, cefepime -Narrow antibiotics vancomycin Valtrex was discontinued due to elevated creatinine -Cultures remain negative, Flagyl also discontinued today 06/12/2020 Continue cefepime  -Cultures has been negative to date, no leukocytosis, afebrile due to elevated creatinine we will discontinue vancomycin and Valtrex for now. Continue IV cefepime and Flagyl     -Patient met the sepsis criteria on admission with temp of 100.9, tachypneic with respiratory rate of 23, tachycardic heart rate as high as 126,, creatinine elevated to 1.41 (immunocompromised patient) -Blood and urine cultures been obtained --started on broad-spectrum antibiotics   -CXR shows improved aeration compared to previous.  Urinalysis is not convincing for UTI.      - He has a central line in place right chest.  May  also be fever of malignancy.  -We will f/up with Blood and Crine cultures,  monitor strict I/O's as he developed volume overload on last admission  -Initiating mild gentle IV fluid hydration 50 mL/hour x4 hours.  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 Mike Gip.   -On chemotherapy with azacitidine and  venetoclax. -on  venetoclax  -Bone biopsy today 06/12/2020  Anemia of chronic disease and iron deficiency: -Stable monitoring H&H  Has been requiring frequent transfusions as an outpatient.    CKD stage IIIb: - Cr.  1.37, 1.41 >>  1.29 >>1.60 >> 1.71 >>  1.46 today  Chronic appears stable.  Continue monitor.  Crohn's disease: Has intermittent diarrhea, currently stable  Stable no signs of flareup  BPH with chronic urinary retention: -Remained stable Patient self catheterizes.  Generalized weakness: Request PT/OT eval.   Ethics: DNR-patient confirms  --was entertained possible hospice After reviewing medical records, ongoing cancer, immunocompromise, presenting with source, sepsis ruled out.  Nevertheless due to comorbidities, patient frailty and age anticipated prognosis to be poor. Palliative care consult -determine goal of care    DVT prophylaxis: Subcutaneous heparin Code Status: DNR, confirmed with patient on admission Family Communication: Discussed with patient, he has discussed with family Disposition Plan: From home, PT/OT recommended SNF  consults called:  Heme oncologist  Admission status:    Status is - Inpatient  [place]isty status of inpatient due to sepsis etiology, requiring IV antibiotics, fluids, acute respiratory failure, acute renal injury, needing input from consultants... Needing intervention for hemodynamic stability   Dispo: The patient is from: Home  Anticipated d/c is to:  SNF  Anticipated d/c date is: The patient and his wife are agreeable to SNF will be discharged in 1 to 2  days if remains stable  Patient currently is not medically stable to d/c... As patient continues to receive IV antibiotics, as sepsis being ruled out, in immunocompromised patient, evaluation by heme oncologist.  Severe debility now unable to ambulate... Willing to go to SNF        Nutritional status:  Nutrition Problem: Severe  Malnutrition Etiology: cancer and cancer related treatments Signs/Symptoms: severe fat depletion, severe muscle depletion Interventions: Ensure Enlive (each supplement provides 350kcal and 20 grams of protein), MVI, Liberalize Diet Cultures; Blood Cultures x 2 >> NGT Urine Culture  >>> NGT    Antimicrobials: 06/08/2020 vancomycin/cefepime>>     Consultants: Palliative care     Procedures:   No admission procedures for hospital encounter.     Antimicrobials:  Anti-infectives (From admission, onward)   Start     Dose/Rate Route Frequency Ordered Stop   06/09/20 2200  vancomycin (VANCOREADY) IVPB 750 mg/150 mL  Status:  Discontinued        750 mg 150 mL/hr over 60 Minutes Intravenous Every 12 hours 06/09/20 2153 06/11/20 0814   06/09/20 1000  valACYclovir (VALTREX) tablet 500 mg  Status:  Discontinued        500 mg Oral Daily 06/08/20 2123 06/11/20 0814   06/09/20 1000  vancomycin (VANCOREADY) IVPB 750 mg/150 mL  Status:  Discontinued        750 mg 150 mL/hr over 60 Minutes Intravenous Every 12 hours 06/08/20 2154 06/09/20 2152   06/09/20 0700  ceFEPIme (MAXIPIME) 2 g in sodium chloride 0.9 % 100 mL IVPB        2 g 200 mL/hr over 30 Minutes Intravenous Every 12 hours 06/08/20 2154  06/08/20 2200  metroNIDAZOLE (FLAGYL) IVPB 500 mg  Status:  Discontinued        500 mg 100 mL/hr over 60 Minutes Intravenous Every 8 hours 06/08/20 2121 06/12/20 1116   06/08/20 2100  vancomycin (VANCOREADY) IVPB 1750 mg/350 mL        1,750 mg 175 mL/hr over 120 Minutes Intravenous  Once 06/08/20 1921 06/08/20 2316   06/08/20 1900  ceFEPIme (MAXIPIME) 2 g in sodium chloride 0.9 % 100 mL IVPB        2 g 200 mL/hr over 30 Minutes Intravenous  Once 06/08/20 1854 06/08/20 1951       Medication:  . allopurinol  150 mg Oral Daily  . amLODipine  5 mg Oral Daily  . Chlorhexidine Gluconate Cloth  6 each Topical Daily  . enoxaparin (LOVENOX) injection  40 mg Subcutaneous Q24H  . feeding  supplement (ENSURE ENLIVE)  237 mL Oral TID BM  . finasteride  5 mg Oral QPM  . folic acid  1 mg Oral [id number]  . latanoprost  1 drop Both Eyes QHS  . mouth rinse  15 mL Mouth Rinse BID  . midazolam      . multivitamin with minerals  1 tablet Oral Daily  . timolol  1 drop Both Eyes Daily  . venetoclax  100 mg Oral Daily  . vitamin B-12  1,000 mcg Oral Q1200    sodium chloride, acetaminophen **OR** acetaminophen, benzonatate, fentaNYL, hydrALAZINE, LORazepam, midazolam, ondansetron **OR** ondansetron (ZOFRAN) IV   Objective:   Vitals:   06/12/20 1101 06/12/20 1106 06/12/20 1110 06/12/20 1115  BP: 134/80 131/71 136/80 124/83  Pulse: 96 94 (!) 103 98  Resp: 17 16 17 18   Temp:      TempSrc:      SpO2: 100% 100% 100% 100%  Weight:      Height:        Intake/Output Summary (Last 24 hours) at 06/12/2020 1132 Last data filed at 06/12/2020 1607 Gross per 24 hour  Intake 497.98 ml  Output 1100 ml  Net -602.02 ml   Filed Weights   06/08/20 1703 06/12/20 0420  Weight: 70.8 kg 71.4 kg     Examination:        Physical Exam:   General:  Alert, oriented, cooperative, no distress; generalized cachexia  HEENT:  Normocephalic, PERRL, otherwise with in Normal limits   Neuro:  CNII-XII intact. , normal motor and sensation, reflexes intact   Lungs:   Clear to auscultation BL, Respirations unlabored, no wheezes / crackles  Cardio:    S1/S2, RRR, No murmure, No Rubs or Gallops   Abdomen:   Soft, non-tender, bowel sounds active all four quadrants,  no guarding or peritoneal signs.  Muscular skeletal:   Generalized weakness, unable to ambulate without assist now  Limited exam - in bed, able to move all 4 extremities, ,  2+ pulses,  symmetric, No pitting edema  Skin:  Dry, warm to touch, negative for any Rashes, No open wounds  Wounds: Please see nursing documentation Pressure Injury (Active)     Location:   Location Orientation:   Staging:   Wound Description (Comments):    Present on Admission:               ------------------------------------------------------------------------------------------------------------------------------------------    LABs:  CBC Latest Ref Rng & Units 06/12/2020 06/11/2020 06/10/2020  WBC 4.0 - 10.5 K/uL 6.3 6.9 8.0  Hemoglobin 13.0 - 17.0 g/dL 8.9(L) 8.7(L) 9.1(L)  Hematocrit 39 - 52 % 27.7(L)  27.3(L) 28.6(L)  Platelets 150 - 400 K/uL 210 185 206   CMP Latest Ref Rng & Units 06/12/2020 06/11/2020 06/10/2020  Glucose 70 - 99 mg/dL 87 85 88  BUN 8 - 23 mg/dL 31(H) 38(H) 35(H)  Creatinine 0.61 - 1.24 mg/dL 1.46(H) 1.71(H) 1.60(H)  Sodium 135 - 145 mmol/L 130(L) 131(L) 132(L)  Potassium 3.5 - 5.1 mmol/L 2.9(L) 3.1(L) 3.6  Chloride 98 - 111 mmol/L 103 101 100  CO2 22 - 32 mmol/L 22 22 22   Calcium 8.9 - 10.3 mg/dL 9.2 8.8(L) 8.7(L)  Total Protein 6.5 - 8.1 g/dL - - -  Total Bilirubin 0.3 - 1.2 mg/dL - - -  Alkaline Phos 38 - 126 U/L - - -  AST 15 - 41 U/L - - -  ALT 0 - 44 U/L - - -       Micro Results Recent Results (from the past 240 hour(s))  Culture, blood (Routine X 2) w Reflex to ID Panel     Status: None (Preliminary result)   Collection Time: 06/08/20  3:14 PM   Specimen: BLOOD  Result Value Ref Range Status   Specimen Description   Final    BLOOD RIGHT ARM Performed at [facility], [address], [place], [place] [address]    Special Requests   Final    BOTTLES DRAWN AEROBIC AND ANAEROBIC Blood Culture adequate volume Performed at [facility], [address], [place], [place] [address]    Culture   Final    NO GROWTH 4 DAYS Performed at [facility], [address] [place], [place] [address]    Report Status PENDING  Incomplete  Culture, blood (Routine X 2) w Reflex to ID Panel     Status: None (Preliminary result)   Collection Time: 06/08/20  3:33 PM   Specimen: BLOOD  Result Value Ref Range Status   Specimen Description   Final    BLOOD LEFT ARM Performed at [facility], [address], [place], [place] [address]    Special Requests   Final    BOTTLES DRAWN AEROBIC AND ANAEROBIC Blood Culture adequate volume Performed at [facility], [address], [place], [place] [address]    Culture   Final    NO GROWTH 4 DAYS Performed at [facility], [address], [place], [place] [address]    Report Status PENDING  Incomplete  SARS Coronavirus 2 by RT PCR (hospital order, performed in [facility] hospital lab) Nasopharyngeal Nasopharyngeal Swab     Status: None   Collection Time: 06/08/20  5:23 PM   Specimen: Nasopharyngeal Swab  Result Value Ref Range Status   SARS Coronavirus 2 NEGATIVE NEGATIVE Final    Comment: (NOTE) SARS-CoV-2 target nucleic acids are NOT DETECTED.  The SARS-CoV-2 RNA is generally detectable in upper and lower respiratory specimens during the acute phase of infection. The lowest concentration of SARS-CoV-2 viral copies this assay can detect is 250 copies / mL. A negative result does not preclude SARS-CoV-2 infection and should not be used as the sole basis for treatment or other patient management decisions.  A negative result may occur with improper specimen collection / handling, submission of specimen other than nasopharyngeal swab, presence of viral mutation(s) within the areas targeted by this assay, and inadequate number of viral copies (<250 copies / mL). A negative result must be combined with clinical observations, patient history, and epidemiological information.  Fact Sheet for Patients:   StrictlyIdeas.no  Fact  Sheet for Healthcare Providers: BankingDealers.co.za  This test is not yet approved or  cleared by the [place] and has been authorized for detection and/or diagnosis of SARS-CoV-2 by FDA under an Emergency Use Authorization (EUA).  This EUA will remain in effect (meaning this test can be used) for the duration of  the COVID-19 declaration under Section 564(b)(1) of the Act, 21 U.S.C. section 360bbb-3(b)(1), unless the authorization is terminated or revoked sooner.  Performed at [facility], [address] [place], [place] [address]   Urine culture     Status: None   Collection Time: 06/08/20  5:23 PM   Specimen: Urine, Random  Result Value Ref Range Status   Specimen Description   Final    URINE, RANDOM Performed at [facility], [address], [place], [place] [address]    Special Requests   Final    NONE Performed at [facility], [address], [place], [place] [address]    Culture   Final    NO GROWTH Performed at [facility], [address], [place], [place] [address]    Report Status 06/10/2020 FINAL  Final  MRSA PCR Screening     Status: None   Collection Time: 06/11/20  1:43 PM   Specimen: Nasopharyngeal  Result Value Ref Range Status   MRSA by PCR NEGATIVE NEGATIVE Final    Comment:        The GeneXpert MRSA Assay (FDA approved for NASAL specimens only), is one component of a comprehensive MRSA colonization surveillance program. It is not intended to diagnose MRSA infection nor to guide or monitor treatment for MRSA infections. Performed at [facility], [address], [place], [place] [address]     Radiology Reports DG Chest 1 View  Result Date: 06/11/2020 CLINICAL DATA:  Shortness of breath EXAM: CHEST  1 VIEW COMPARISON:  June 09, 2020 FINDINGS: Central catheter tip is in the superior vena cava. No pneumothorax. There is cardiomegaly with pulmonary vascularity within normal limits. There is airspace opacity in the left lower lobe. Elsewhere there is interstitial prominence in the mid lung regions. No appreciable adenopathy. No bone lesions. IMPRESSION: Airspace opacity consistent with pneumonia in the left lower lung region and to a lesser extent in each mid lung region. Appearance indicative of multifocal  pneumonia. Heart remains prominent with pulmonary vascularity within normal limits. No adenopathy. Central catheter tip in superior vena cava without pneumothorax. Electronically Signed   By: Lowella Grip III M.D.   On: 06/11/2020 08:34   DG Chest 2 View  Result Date: 06/08/2020 CLINICAL DATA:  Chronic cough, shortness of breath EXAM: CHEST - 2 VIEW COMPARISON:  Prior chest x-ray 04/26/2020 FINDINGS: Tunneled right IJ central venous catheter present. The tip overlies the superior cavoatrial junction. Stable mild cardiomegaly. Mild pulmonary vascular congestion without overt edema. Significantly improved aeration compared to the prior radiograph. Small layering left pleural effusion with associated atelectasis. Stable chronic bronchitic changes and areas of linear scarring. No pneumothorax. No focal airspace infiltrate. IMPRESSION: 1. Overall, significantly improved aeration compared to prior imaging from 04/26/2020. 2. Stable cardiomegaly and pulmonary vascular congestion without overt edema. 3. Small left pleural effusion with associated atelectasis. 4. Tunneled right IJ central line in good position. Electronically Signed   By: Jacqulynn Cadet M.D.   On: 06/08/2020 14:04   NM Pulmonary Perfusion  Result Date: 06/11/2020 CLINICAL DATA:  Shortness of breath EXAM: NUCLEAR MEDICINE PERFUSION LUNG SCAN TECHNIQUE: Perfusion images were obtained  in multiple projections after intravenous injection of radiopharmaceutical. Views: Anterior, posterior, left lateral, right lateral, RPO, LPO, RAO, LAO RADIOPHARMACEUTICALS:  4.37 mCi Tc-34mMAA IV COMPARISON:  Chest radiograph June 11, 2020 FINDINGS: There is slight decreased radiotracer uptake in each apex, nonsegmental. Elsewhere, the distribution of uptake on the perfusion study is normal. IMPRESSION: Slightly decreased radiotracer uptake in the apex regions, nonsegmental, a finding of questionable significance. This study is considered low probability for  pulmonary embolus. Electronically Signed   By: WLowella GripIII M.D.   On: 06/11/2020 12:02   UKoreaVenous Img Lower Bilateral (DVT)  Result Date: 06/09/2020 CLINICAL DATA:  Bilateral lower extremity pain 1 week. EXAM: BILATERAL LOWER EXTREMITY VENOUS DOPPLER ULTRASOUND TECHNIQUE: Gray-scale sonography with compression, as well as color and duplex ultrasound, were performed to evaluate the deep venous system(s) from the level of the common femoral vein through the popliteal and proximal calf veins. COMPARISON:  None. FINDINGS: VENOUS Normal compressibility of the common femoral, superficial femoral, and popliteal veins, as well as the visualized calf veins bilaterally. Visualized portions of profunda femoral vein and great saphenous vein unremarkable bilaterally. No filling defects to suggest DVT on grayscale or color Doppler imaging. Doppler waveforms show normal direction of venous flow, normal respiratory plasticity and response to augmentation bilaterally. OTHER None. Limitations: none IMPRESSION: Negative bilateral lower extremity Doppler evaluation. Electronically Signed   By: DMarin OlpM.D.   On: 06/09/2020 17:09   IR Fluoro Guide CV Line Left  Result Date: 05/18/2020 CLINICAL DATA:  AML, needs venous access for treatment regimen. Renal insufficiency. EXAM: TUNNELED CENTRAL VENOUS CATHETER PLACEMENT WITH ULTRASOUND AND FLUOROSCOPIC GUIDANCE TECHNIQUE: The procedure, risks, benefits, and alternatives were explained to the patient. Questions regarding the procedure were encouraged and answered. The patient understands and consents to the procedure. Patency of the right IJ vein was confirmed with ultrasound with image documentation. An appropriate skin site was determined. Region was prepped using maximum barrier technique including cap and mask, sterile gown, sterile gloves, large sterile sheet, and Chlorhexidine as cutaneous antisepsis. The region was infiltrated locally with 1% lidocaine. Under  real-time ultrasound guidance, the right IJ vein was accessed with a 21 gauge micropuncture needle; the needle tip within the vein was confirmed with ultrasound image documentation. 45F single-lumen cuffed PowerLine tunneled from a right anterior chest wall approach to the dermatotomy site. Needle exchanged over the 018 guidewire for transitional dilator, through which the catheter which had been cut to 22 cm was advanced under intermittent fluoroscopy, positioned with its tip at the cavoatrial junction. Spot chest radiograph confirms good catheter position. No pneumothorax. Catheter was flushed per protocol. Catheter secured externally with O Prolene suture. The right IJ dermatotomy site was closed with Dermabond. COMPLICATIONS: COMPLICATIONS None immediate FLUOROSCOPY TIME:  Less than 0.1 minute; 0.25 mGy COMPARISON:  None IMPRESSION: 1. Technically successful placement of tunneled right IJ tunneled single-lumen power injectable catheter with ultrasound and fluoroscopic guidance. Ready for routine use. Electronically Signed   By: DLucrezia EuropeM.D.   On: 05/18/2020 11:33   DG Chest Port 1 View  Result Date: 06/09/2020 CLINICAL DATA:  Dyspnea, tachypnea, tachycardia EXAM: PORTABLE CHEST 1 VIEW COMPARISON:  06/08/2020 chest radiograph. FINDINGS: Right internal jugular central venous catheter terminates in the lower third of the SVC. Stable cardiomediastinal silhouette with mild cardiomegaly. No pneumothorax. Small left pleural effusion is unchanged. No significant right pleural effusion. Diffuse hazy parahilar interstitial lung opacities, increased. IMPRESSION: 1. Stable mild cardiomegaly. Diffuse hazy parahilar interstitial lung opacities,  increased, favor pulmonary edema. 2. Stable small left pleural effusion. Electronically Signed   By: Ilona Sorrel M.D.   On: 06/09/2020 15:21    SIGNED: Deatra Trentin, MD, FACP, FHM. [facility],  Pager (please use [email or web address] to page/text)  If 7PM-7AM, please  contact night-coverage [email or web address] 06/12/2020, 11:32 AM

## 2020-06-13 LAB — BASIC METABOLIC PANEL
Anion gap: 8 (ref 5–15)
BUN: 35 mg/dL — ABNORMAL HIGH (ref 8–23)
CO2: 22 mmol/L (ref 22–32)
Calcium: 10.3 mg/dL (ref 8.9–10.3)
Chloride: 102 mmol/L (ref 98–111)
Creatinine, Ser: 1.47 mg/dL — ABNORMAL HIGH (ref 0.61–1.24)
GFR calc Af Amer: 54 mL/min — ABNORMAL LOW (ref >60)
GFR calc non Af Amer: 47 mL/min — ABNORMAL LOW (ref >60)
Glucose, Bld: 89 mg/dL (ref 70–99)
Potassium: 3.4 mmol/L — ABNORMAL LOW (ref 3.5–5.1)
Sodium: 132 mmol/L — ABNORMAL LOW (ref 135–145)

## 2020-06-13 LAB — CULTURE, BLOOD (ROUTINE X 2)
Culture: NO GROWTH
Culture: NO GROWTH
Special Requests: ADEQUATE
Special Requests: ADEQUATE

## 2020-06-13 MED ORDER — LOPERAMIDE HCL 1 MG/7.5ML PO SUSP
2.0000 mg | ORAL | Status: DC | PRN
  Filled 2020-06-13: qty 15

## 2020-06-13 MED ORDER — ALPRAZOLAM 0.25 MG PO TABS: 0.2500 mg | ORAL_TABLET | Freq: Three times a day (TID) | ORAL | Status: DC | PRN

## 2020-06-13 MED ORDER — LOPERAMIDE HCL 1 MG/7.5ML PO SUSP
1.0000 mg | ORAL | Status: DC | PRN
  Filled 2020-06-13: qty 7.5

## 2020-06-13 MED ORDER — LOPERAMIDE HCL 2 MG PO CAPS
2.0000 mg | ORAL_CAPSULE | ORAL | Status: DC | PRN
  Filled 2020-06-13: qty 1

## 2020-06-13 NOTE — Progress Notes (Signed)
   06/13/20 1330  Clinical Encounter Type  Visited With Patient and family together  Visit Type Follow-up  Referral From Chaplain  Consult/Referral To Chaplain  When chaplain arrived at pt's room, he was sleep. Chaplain went and spoke with nurse and when chaplain came back to the room, pt was awake and his wife was at bedside. Chaplain told them, she was checking on them. Patient looks a little my comfortable today than he did yesterday. Pt's wife said that he is going to tell his doctor that he doesn't want anymore chemo. Wife said she is leaving to take care of business, but will be back. Wife is hoping to talk to pt's doctor, before she leaves. Before leaving, chaplain hugged wife as she whispered a quiet prayer. Chaplain will check in on pt when she is back in.

## 2020-06-13 NOTE — Progress Notes (Signed)
PROGRESS NOTE    Ruben [name]  JOI:[id number] DOB: 1945/10/23 DOA: 06/08/2020 PCP: [name], MD   Brief Narrative:  74 year old male with history of AML on active chemotherapy admitted for weakness and fever.  Fever has resolved.  No clear infectious focus.  Oncology following.  Patient status post bone marrow biopsy to guide further treatment.  Deciding between hospice care and discharge to skilled nursing facility.  Decision making will depend upon results of bone marrow biopsy.   Assessment & Plan:   Principal Problem:   SIRS (systemic inflammatory response syndrome) (HCC) Active Problems:   BPH (benign prostatic hyperplasia)   Crohn disease (HCC)   Iron deficiency anemia   CKD (chronic kidney disease) stage 3, GFR 30-59 ml/min   Acute myeloid leukemia not having achieved remission (HCC)   Sepsis (HCC)   Protein-calorie malnutrition, severe   Palliative care encounter  Brief acute respiratory failure Acute respiratory distress x2 since admission... Stabilized now off supplemental oxygen On room air satting 100% -Stable now  -Acute respiratory failure likely due to volume overload -IV fluid given to the sepsis, sepsis protocol without bolus -on 06/09/2020 patient developed hypoxia, tachypnea, tachycardia, T-max of 100.4 Was thought was due to volume overload, patient was anxious, small dose of 0.5 Ativan given, patient was placed on nonrebreather mask, 40 mg of Lasix was given Patient responded well to treatment, currently has been weaned off oxygen, vitals are stable Plan: Vitals per unit protocol Oxygen if necessary  Sepsis/SIRS - POA -upper respiratory symptoms (immunocompromised)/likely pneumonia Chest x-ray imaging consistent with possible pneumonia -patient is vaccinated, on admission SARS-CoV-2 screen negative Brief acute respiratory failure 06/09/2020, 06/10/2020 (T-max 100.4) -Improved sepsis physiology -Patient met the sepsis criteria on admission with  temp of 100.9, tachypneic with respiratory rate of 23, tachycardic heart rate as high as 126,, creatinine elevated to 1.41 (immunocompromised patient) -Afebrile normotensive now Has been on IV antibiotics of vancomycin Valtrex,Flagyl, cefepime -Narrow antibiotics vancomycin Valtrex was discontinued due to elevated creatinine -Cultures remain negative, Flagyl also discontinued 06/12/2020 Plan: Continue cefepime Continue Flagyl for now Can likely discontinue antibiotics within 24 hours of all cultures remain negative Unclear source of fever.  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 Mike Gip.  -On chemotherapy with azacitidine and venetoclax. -on  venetoclax  -Bone biopsy 06/12/2020 Plan: Oncology and palliative care follow-up for bone marrow biopsy results Results of bone marrow biopsy likely to determine appropriate course of action  Anemia of chronic disease and iron deficiency: -Stable monitoring H&H Has been requiring frequent transfusions as an outpatient.   CKD stage IIIb: - Cr.  1.37, 1.41 >>  1.29 >>1.60 >> 1.71 >>  1.46 >>1.47 today  Chronic appears stable. Continue monitor.  Crohn's disease: Has intermittent diarrhea, currently stable Stable no signs of flareup  BPH with chronic urinary retention: -Remained stable Patient self catheterizes.  Generalized weakness: Request PT/OT eval.  Ethics: DNR-patient confirms  --was entertained possible hospice After reviewing medical records, ongoing cancer, immunocompromise, presenting with source, sepsis ruled out.  Nevertheless due to comorbidities, patient frailty and age anticipated prognosis to be poor. Palliative care consult -determine goal of care   DVT prophylaxis: Lovenox Code Status: DNR Family  Communication: Wife [name] via phone [phone] on 9/22 Disposition Plan: Status is: Inpatient  Remains inpatient appropriate because:IV treatments appropriate due to intensity of illness  or inability to take PO and Inpatient level of care appropriate due to severity of illness   Dispo: The patient is from: Home              Anticipated d/c is to: SNF              Anticipated d/c date is: 1 day              Patient currently is not medically stable to d/c.  Remains on empiric IV broad-spectrum antibiotics for fever unknown origin.  No clear infectious focus identified.  Discharge planning unclear at this time.  Results of bone marrow biopsy will likely guide the patient and the family regarding appropriate next steps.  Anticipate home with hospice services versus skilled nursing facility placement.   Consultants:   Oncology  Palliative care  Procedures:   Bone marrow biopsy, 06/12/2020  Antimicrobials:  Metronidazole Cefepime   Subjective: Seen and examined.  Complains of some diarrhea but no other specific complaints this morning.  No pain complaints.  Objective: Vitals:   06/12/20 1110 06/12/20 1115 06/12/20 1130 06/12/20 1933  BP: 136/80 124/83 122/82 134/67  Pulse: (!) 103 98 88 91  Resp: 17 18 20 20   Temp:    98.6 F (37 C)  TempSrc:    Oral  SpO2: 100% 100% 100% 99%  Weight:      Height:        Intake/Output Summary (Last 24 hours) at 06/13/2020 1030 Last data filed at 06/13/2020 0955 Gross per 24 hour  Intake 1098.13 ml  Output 1450 ml  Net -351.87 ml   Filed Weights   06/08/20 1703 06/12/20 0420  Weight: 70.8 kg 71.4 kg    Examination:  General exam: No acute distress, appears frail Respiratory system: Bibasilar crackles.  Normal work of breathing.  Room air Cardiovascular system: S1 & S2 heard, RRR. No JVD, murmurs, rubs, gallops or clicks. No pedal edema. Gastrointestinal system: Thin.  Nontender, nondistended.  Normal bowel sounds Central  nervous system: Alert and oriented. No focal neurological deficits. Extremities: Symmetric 5 x 5 power. Skin: No rashes, lesions or ulcers Psychiatry: Judgement and insight appear normal. Mood & affect appropriate.     Data Reviewed: I have personally reviewed following labs and imaging studies  CBC: Recent Labs  Lab 06/08/20 0824 06/09/20 0417 06/10/20 0244 06/11/20 0530 06/12/20 0757  WBC 6.7 5.6 8.0 6.9 6.3  NEUTROABS 3.8  --  4.6 4.3 4.0  HGB 9.9* 8.8* 9.1* 8.7* 8.9*  HCT 30.0* 25.9* 28.6* 27.3* 27.7*  MCV 85.7 84.6 88.3 87.2 87.4  PLT 153 159 206 185 845   Basic Metabolic Panel: Recent Labs  Lab 06/09/20 0417 06/10/20 0244 06/11/20 0530 06/12/20 0757 06/13/20 0634  NA 130* 132* 131* 130* 132*  K 3.3* 3.6 3.1* 2.9* 3.4*  CL 98 100 101 103 102  CO2 23 22 22 22 22   GLUCOSE 84 88 85 87 89  BUN 24* 35* 38* 31* 35*  CREATININE 1.29* 1.60* 1.71* 1.46* 1.47*  CALCIUM 8.3* 8.7* 8.8* 9.2 10.3  MG 1.5*  --  1.9  --   --   PHOS  --   --  4.8*  --   --    GFR: Estimated Creatinine Clearance: 45.2 mL/min (A) (by C-G formula based on SCr of 1.47 mg/dL (H)). Liver Function Tests: No results for input(s): AST, ALT, ALKPHOS, BILITOT, PROT, ALBUMIN in the last 168 hours. No results for input(s): LIPASE, AMYLASE in the  last 168 hours. No results for input(s): AMMONIA in the last 168 hours. Coagulation Profile: Recent Labs  Lab 06/09/20 0417  INR 1.7*   Cardiac Enzymes: No results for input(s): CKTOTAL, CKMB, CKMBINDEX, TROPONINI in the last 168 hours. BNP (last 3 results) No results for input(s): PROBNP in the last 8760 hours. HbA1C: No results for input(s): HGBA1C in the last 72 hours. CBG: No results for input(s): GLUCAP in the last 168 hours. Lipid Profile: No results for input(s): CHOL, HDL, LDLCALC, TRIG, CHOLHDL, LDLDIRECT in the last 72 hours. Thyroid Function Tests: No results for input(s): TSH, T4TOTAL, FREET4, T3FREE, THYROIDAB in the last 72  hours. Anemia Panel: No results for input(s): VITAMINB12, FOLATE, FERRITIN, TIBC, IRON, RETICCTPCT in the last 72 hours. Sepsis Labs: Recent Labs  Lab 06/08/20 1456 06/08/20 1514 06/09/20 0417 06/09/20 0856 06/09/20 1443 06/09/20 1833 06/10/20 0244 06/11/20 0530 06/12/20 0757  PROCALCITON   < >  --  34.65  --   --   --  >150.00 >150.00 105.10  LATICACIDVEN  --  2.2*  --  1.1 3.2* 1.5  --   --   --    < > = values in this interval not displayed.    Recent Results (from the past 240 hour(s))  Culture, blood (Routine X 2) w Reflex to ID Panel     Status: None   Collection Time: 06/08/20  3:14 PM   Specimen: BLOOD  Result Value Ref Range Status   Specimen Description   Final    BLOOD RIGHT ARM Performed at [facility], [address], [place], [place] [address]    Special Requests   Final    BOTTLES DRAWN AEROBIC AND ANAEROBIC Blood Culture adequate volume Performed at [facility], [address], [place], [place] [address]    Culture   Final    NO GROWTH 5 DAYS Performed at [facility], [address], [place], [place] [address]    Report Status 06/13/2020 FINAL  Final  Culture, blood (Routine X 2) w Reflex to ID Panel     Status: None   Collection Time: 06/08/20  3:33 PM   Specimen: BLOOD  Result Value Ref Range Status   Specimen Description   Final    BLOOD LEFT ARM Performed at [facility], [address], [place], [place] [address]    Special Requests   Final    BOTTLES DRAWN AEROBIC AND ANAEROBIC Blood Culture adequate volume Performed at [facility], [address], [place], [place] [address]    Culture   Final    NO GROWTH 5 DAYS Performed at [facility], [address] [place], [place] [address]    Report Status 06/13/2020 FINAL  Final  SARS Coronavirus 2 by RT PCR (hospital order, performed in [facility] hospital lab) Nasopharyngeal Nasopharyngeal Swab     Status: None   Collection Time: 06/08/20   5:23 PM   Specimen: Nasopharyngeal Swab  Result Value Ref Range Status   SARS Coronavirus 2 NEGATIVE NEGATIVE Final    Comment: (NOTE) SARS-CoV-2 target nucleic acids are NOT DETECTED.  The SARS-CoV-2 RNA is generally detectable in upper and lower respiratory specimens during the acute phase of infection. The lowest concentration of SARS-CoV-2 viral copies this assay can detect is 250 copies / mL. A negative result does not preclude SARS-CoV-2 infection and should not be used as the sole basis for treatment or other patient management decisions.  A negative result may occur  with improper specimen collection / handling, submission of specimen other than nasopharyngeal swab, presence of viral mutation(s) within the areas targeted by this assay, and inadequate number of viral copies (<250 copies / mL). A negative result must be combined with clinical observations, patient history, and epidemiological information.  Fact Sheet for Patients:   StrictlyIdeas.no  Fact Sheet for Healthcare Providers: BankingDealers.co.za  This test is not yet approved or  cleared by the [place] FDA and has been authorized for detection and/or diagnosis of SARS-CoV-2 by FDA under an Emergency Use Authorization (EUA).  This EUA will remain in effect (meaning this test can be used) for the duration of the COVID-19 declaration under Section 564(b)(1) of the Act, 21 U.S.C. section 360bbb-3(b)(1), unless the authorization is terminated or revoked sooner.  Performed at [facility], [address] [place], [place] [address]   Urine culture     Status: None   Collection Time: 06/08/20  5:23 PM   Specimen: Urine, Random  Result Value Ref Range Status   Specimen Description   Final    URINE, RANDOM Performed at [facility], [address], [place], [place] [address]    Special Requests   Final    NONE Performed at [facility], [address], [place], [place] [address]    Culture   Final    NO GROWTH Performed at [facility], [place] [address] [place], [place] [address]    Report Status 06/10/2020 FINAL  Final  MRSA PCR Screening     Status: None   Collection Time: 06/11/20  1:43 PM   Specimen: Nasopharyngeal  Result Value Ref Range Status   MRSA by PCR NEGATIVE NEGATIVE Final    Comment:        The GeneXpert MRSA Assay (FDA approved for NASAL specimens only), is one component of a comprehensive MRSA colonization surveillance program. It is not intended to diagnose MRSA infection nor to guide or monitor treatment for MRSA infections. Performed at [facility], [address] [place], [place] [address]          Radiology Studies: NM Pulmonary Perfusion  Result Date: 06/11/2020 CLINICAL DATA:  Shortness of breath EXAM: NUCLEAR MEDICINE PERFUSION LUNG SCAN TECHNIQUE: Perfusion images were obtained in multiple projections after intravenous injection of radiopharmaceutical. Views: Anterior, posterior, left lateral, right lateral, RPO, LPO, RAO, LAO RADIOPHARMACEUTICALS:  4.37 mCi Tc-62mMAA IV COMPARISON:  Chest radiograph June 11, 2020 FINDINGS: There is slight decreased radiotracer uptake in each apex, nonsegmental. Elsewhere, the distribution of uptake on the perfusion study is normal. IMPRESSION: Slightly decreased radiotracer uptake in the apex regions, nonsegmental, a finding of questionable significance. This study is considered low probability for pulmonary embolus. Electronically Signed   By: WLowella GripIII M.D.   On: 06/11/2020 12:02   CT BONE MARROW BIOPSY & ASPIRATION  Result Date: 06/12/2020 CLINICAL DATA:  Acute myeloid leukemia and need for bone marrow biopsy. EXAM: CT GUIDED BONE MARROW ASPIRATION AND BIOPSY ANESTHESIA/SEDATION: Versed 1.0 mg IV, Fentanyl 50 mcg IV Total Moderate Sedation Time:   10 minutes. The patient's level of consciousness and  physiologic status were continuously monitored during the procedure by Radiology nursing. PROCEDURE: The procedure risks, benefits, and alternatives were explained to the patient. Questions regarding the procedure were encouraged and answered. The patient understands and consents to the procedure. A time out was performed prior to initiating the procedure. The right gluteal region was prepped with chlorhexidine. Sterile gown and  sterile gloves were used for the procedure. Local anesthesia was provided with 1% Lidocaine. Under CT guidance, an 11 gauge On Control bone cutting needle was advanced from a posterior approach into the right iliac bone. Needle positioning was confirmed with CT. Initial non heparinized and heparinized aspirate samples were obtained of bone marrow. Core biopsy was performed via the On Control drill needle. COMPLICATIONS: None FINDINGS: Inspection of initial aspirate did reveal visible particles. Intact core biopsy sample was obtained. IMPRESSION: CT guided bone marrow biopsy of right posterior iliac bone with both aspirate and core samples obtained. Electronically Signed   By: Aletta Edouard M.D.   On: 06/12/2020 12:51        Scheduled Meds: . allopurinol  150 mg Oral Daily  . amLODipine  5 mg Oral Daily  . Chlorhexidine Gluconate Cloth  6 each Topical Daily  . enoxaparin (LOVENOX) injection  40 mg Subcutaneous Q24H  . feeding supplement (ENSURE ENLIVE)  237 mL Oral TID BM  . finasteride  5 mg Oral QPM  . folic acid  1 mg Oral [id number]  . latanoprost  1 drop Both Eyes QHS  . mouth rinse  15 mL Mouth Rinse BID  . multivitamin with minerals  1 tablet Oral Daily  . timolol  1 drop Both Eyes Daily  . vitamin B-12  1,000 mcg Oral Q1200   Continuous Infusions: . sodium chloride Stopped (06/12/20 2243)  . ceFEPime (MAXIPIME) IV Stopped (06/12/20 2052)     LOS: 4 days    Time spent: 25 minutes    Sidney Ace, MD [facility] Pager [phone]  If  7PM-7AM, please contact night-coverage 06/13/2020, 10:30 AM

## 2020-06-13 NOTE — Progress Notes (Signed)
Mobility Specialist - Progress Note   06/13/20 1218  Mobility  Activity Ambulated in hall  Level of Assistance Contact guard assist, steadying assist  Assistive Device None  Distance Ambulated (ft) 260 ft  Mobility Response Tolerated well  Mobility performed by Mobility specialist  $Mobility charge 1 Mobility    Pre-mobility: 70 HR, 113/54 BP, 100% SpO2 During mobility: 75 HR, 89% SpO2 Post-mobility: 70 HR, 129/63 BP, 100% SpO2   Pt was sitting in recliner with wife present in room upon arrival. Pt was hesitant to session, but wife encouraged that he go for a walk and pt agreed. Pt was AOx4. Pt was independent in sit-to-stand and agreed to attempt ambulation without RW this session. Pt ambulated a total of 260' around nurses station with CGA. No LOB noted. Pt maintained good upright posture throughout session, but needed VC to ambulate at a slow and controlled pace. After 200', pt's O2 desat to 89%, however pt denied SOB. No heavy breathing was noted, but mobility encouraged pt to utilize PLB technique to increase O2 sats. Upon getting back to bed, pt c/o feeling fatigue but rated it a "1/10". Overall, pt tolerated session well. Pt was left in bed with phone/call bell in reach.    Kathee Delton Mobility Specialist 06/13/20, 12:29 PM

## 2020-06-13 NOTE — TOC Progression Note (Signed)
Transition of Care [facility]) - Progression Note    Patient Details  Name: OUMAR MARCOTT MRN: [id number] Date of Birth: 1946/04/25  Transition of Care [facility]) CM/SW Contact  Meriel Flavors, LCSW Phone Number: 06/13/2020, 10:50 AM  Clinical Narrative:    Patient's wife requested to speak with [facility] social worker. CSW met with patient and wife [name] to discuss options for care moving forward. [name] also invited Chaplain who was at the nurses station to join our conversation. [name] was feeling very overwhelmed with providing care for [facility] and as she shared her feelings the conversation came to [place] deciding if he wanted to continue to fight and continue getting treatments. At the end of the conversation it appeared [facility] was processing his EOL choices and was leaning in the direction of Hospice.   Expected Discharge Plan:  (Unable to determine at this time.) Barriers to Discharge: Continued Medical Work up  Expected Discharge Plan and Services Expected Discharge Plan:  (Unable to determine at this time.)       Living arrangements for the past 2 months: Single Family Home                                       Social Determinants of Health (SDOH) Interventions    Readmission Risk Interventions Readmission Risk Prevention Plan 06/11/2020 04/26/2020  Transportation Screening Complete Complete  PCP or Specialist Appt within 3-5 Days - Complete  Social Work Consult for [place] Planning/Counseling - Complete  Palliative Care Screening - Not Applicable  Medication Review Press photographer) Complete Complete  PCP or Specialist appointment within 3-5 days of discharge Complete -  [place] or Home Care Consult Complete -  SW Recovery Care/Counseling Consult Complete -  Palliative Care Screening Complete -  Skilled Nursing Facility Complete -  Some recent data might be hidden

## 2020-06-14 LAB — CBC WITH DIFFERENTIAL/PLATELET
Abs Immature Granulocytes: 0.67 10*3/uL — ABNORMAL HIGH (ref 0.00–0.07)
Basophils Absolute: 0.2 10*3/uL — ABNORMAL HIGH (ref 0.0–0.1)
Basophils Relative: 4 %
Eosinophils Absolute: 0 10*3/uL (ref 0.0–0.5)
Eosinophils Relative: 0 %
HCT: 26.7 % — ABNORMAL LOW (ref 39.0–52.0)
Hemoglobin: 8.4 g/dL — ABNORMAL LOW (ref 13.0–17.0)
Immature Granulocytes: 12 %
Lymphocytes Relative: 14 %
Lymphs Abs: 0.8 10*3/uL (ref 0.7–4.0)
MCH: 27.7 pg (ref 26.0–34.0)
MCHC: 31.5 g/dL (ref 30.0–36.0)
MCV: 88.1 fL (ref 80.0–100.0)
Monocytes Absolute: 0.5 10*3/uL (ref 0.1–1.0)
Monocytes Relative: 8 %
Neutro Abs: 3.6 10*3/uL (ref 1.7–7.7)
Neutrophils Relative %: 62 %
Platelets: 229 10*3/uL (ref 150–400)
RBC: 3.03 MIL/uL — ABNORMAL LOW (ref 4.22–5.81)
RDW: 19.2 % — ABNORMAL HIGH (ref 11.5–15.5)
Smear Review: NORMAL
WBC: 5.8 10*3/uL (ref 4.0–10.5)
nRBC: 9.4 % — ABNORMAL HIGH (ref 0.0–0.2)

## 2020-06-14 LAB — SURGICAL PATHOLOGY

## 2020-06-14 LAB — BASIC METABOLIC PANEL
Anion gap: 6 (ref 5–15)
BUN: 35 mg/dL — ABNORMAL HIGH (ref 8–23)
CO2: 23 mmol/L (ref 22–32)
Calcium: 10.3 mg/dL (ref 8.9–10.3)
Chloride: 101 mmol/L (ref 98–111)
Creatinine, Ser: 1.27 mg/dL — ABNORMAL HIGH (ref 0.61–1.24)
GFR calc Af Amer: >60 mL/min (ref >60)
GFR calc non Af Amer: 56 mL/min — ABNORMAL LOW (ref >60)
Glucose, Bld: 90 mg/dL (ref 70–99)
Potassium: 3.1 mmol/L — ABNORMAL LOW (ref 3.5–5.1)
Sodium: 130 mmol/L — ABNORMAL LOW (ref 135–145)

## 2020-06-14 MED ORDER — POTASSIUM CHLORIDE CRYS ER 20 MEQ PO TBCR
40.0000 meq | EXTENDED_RELEASE_TABLET | Freq: Once | ORAL | Status: AC
  Administered 2020-06-14: 40 meq via ORAL
  Filled 2020-06-14: qty 2

## 2020-06-14 NOTE — Progress Notes (Signed)
[place]  Telephone:([phone] [phone]   Name: Ruben [name] Date: 06/14/2020 MRN: [id number]  DOB: 1946-07-16  Patient Care Team: [name], MD as PCP - General (Family Medicine) [name], MD (Inactive) (Gastroenterology) Dingeldein, Remo Lipps, MD (Ophthalmology) Lequita Asal, MD as Referring Physician (Hematology and Oncology) Dasher, Rayvon Char, MD as Referring Physician (Dermatology) Anthonette Legato, MD as Consulting Physician (Internal Medicine) Hollice Espy, MD as Consulting Physician (Urology) Crissie Sickles, MD as Referring Physician (Hematology and Oncology) Vladimir Crofts, MD as Consulting Physician (Neurology)    REASON FOR CONSULTATION: Ruben [name] is a 74 y.o. 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  Chest x-ray revealed possible multifocal pneumonia. Patient was referred to palliative care to help address goals and manage ongoing symptoms.. .   CODE STATUS: DNR  PAST MEDICAL HISTORY: Past Medical History:  Diagnosis Date  . Blood dyscrasia   . BPH (benign prostatic hyperplasia)   . Cancer ([place])    SKIN/ POLYCYTHEMIA VERA  . Chronic kidney disease    RENAL INSUFF (40%)  . Crohn disease ([place])   . Crohn's disease ([place])   . Glaucoma   . Glaucoma   . Gout   . History of chicken pox   . Myelofibrosis ([place])   . Nosebleed   . RBBB   . Right bundle branch block     PAST SURGICAL HISTORY:  Past Surgical History:  Procedure Laterality Date  . CATARACT EXTRACTION W/PHACO Left 07/13/2018   Procedure: CATARACT EXTRACTION PHACO AND INTRAOCULAR LENS PLACEMENT (IOC);  Surgeon: [name], MD;  Location: [facility] ORS;  Service:  Ophthalmology;  Laterality: Left;  [place] 00:39  CDE 5.14 Fluid pack lot # [id number]  . CATARACT EXTRACTION W/PHACO Right 08/03/2018   Procedure: CATARACT EXTRACTION PHACO AND INTRAOCULAR LENS PLACEMENT (IOC);  Surgeon: [name], MD;  Location: [facility] ORS;  Service: Ophthalmology;  Laterality: Right;  [place] 00:43.4 CDE 5.23 Fluid pack Lot # [id number]  . COLONOSCOPY WITH PROPOFOL N/A 03/27/2017   Procedure: COLONOSCOPY WITH PROPOFOL;  Surgeon: [name], MD;  Location[facility] ENDOSCOPY;  Service: Endoscopy;  Laterality: N/A;  . IR FLUORO GUIDE CV LINE LEFT  05/18/2020  . MOHS SURGERY     EAR  . PROSTATE SURGERY  2008   Prostate Biopsy in [place] to elevated PSA.  per patient normal  . Skin Lesion Basal cell removed    . wart removal     from eyelid    HEMATOLOGY/ONCOLOGY HISTORY:  Oncology History  Acute myeloid leukemia not having achieved remission ([place])  03/07/2020 Initial Diagnosis   Acute myeloid leukemia not having achieved remission ([place])   05/21/2020 -  Chemotherapy   The patient had azaCITIDine (VIDAZA) chemo injection 150 mg, 78.5 mg/m2 = 142.5 mg, Subcutaneous,  Once, 1 of 4 cycles Administration: 150 mg (05/21/2020), 150 mg (05/22/2020), 150 mg (05/23/2020), 150 mg (05/24/2020), 150 mg (05/25/2020), 150 mg (05/29/2020), 150 mg (05/30/2020)  for chemotherapy treatment.      ALLERGIES:  has No Known Allergies.  MEDICATIONS:  Current Facility-Administered Medications  Medication Dose Route Frequency Provider Last Rate Last Admin  . 0.9 %  sodium chloride infusion   Intravenous PRN Deatra Ericberto, MD   Paused at 06/13/20 2118  .  acetaminophen (TYLENOL) tablet 650 mg  650 mg Oral Q6H PRN Lenore Cordia, MD   650 mg at 06/10/20 2031   Or  . acetaminophen (TYLENOL) suppository 650 mg  650 mg Rectal Q6H PRN Lenore Cordia, MD      . allopurinol (ZYLOPRIM) tablet 150 mg  150 mg Oral Daily Lenore Cordia, MD   150 mg at 06/14/20 0630  . ALPRAZolam (XANAX) tablet 0.25 mg   0.25 mg Oral TID PRN Ralene Muskrat B, MD      . amLODipine (NORVASC) tablet 5 mg  5 mg Oral Daily Shahmehdi, Seyed A, MD   5 mg at 06/14/20 0823  . benzonatate (TESSALON) capsule 100 mg  100 mg Oral BID PRN Zada Finders R, MD      . ceFEPIme (MAXIPIME) 2 g in sodium chloride 0.9 % 100 mL IVPB  2 g Intravenous Q12H Sidney Ace, MD   Stopped at 06/14/20 [phone]  . Chlorhexidine Gluconate Cloth 2 % PADS 6 each  6 each Topical Daily Deatra Benjamyn, MD   6 each at 06/14/20 1217  . enoxaparin (LOVENOX) injection 40 mg  40 mg Subcutaneous Q24H Dallie Piles, RPH   40 mg at 06/12/20 1801  . feeding supplement (ENSURE ENLIVE) (ENSURE ENLIVE) liquid 237 mL  237 mL Oral TID BM Shahmehdi, Seyed A, MD   237 mL at 06/14/20 1406  . finasteride (PROSCAR) tablet 5 mg  5 mg Oral QPM Shahmehdi, Seyed A, MD   5 mg at 06/13/20 1815  . folic acid (FOLVITE) tablet 1 mg  1 mg Oral Q1200 Zada Finders R, MD   1 mg at 06/14/20 1146  . hydrALAZINE (APRESOLINE) injection 10 mg  10 mg Intravenous Q4H PRN Shahmehdi, Seyed A, MD      . latanoprost (XALATAN) 0.005 % ophthalmic solution 1 drop  1 drop Both Eyes QHS Lenore Cordia, MD   1 drop at 06/13/20 2023  . loperamide HCl (IMODIUM) 1 MG/7.5ML suspension 1 mg  1 mg Oral PRN Ralene Muskrat B, MD      . MEDLINE mouth rinse  15 mL Mouth Rinse BID Shahmehdi, Seyed A, MD   15 mL at 06/14/20 0842  . multivitamin with minerals tablet 1 tablet  1 tablet Oral Daily Deatra Jarelle, MD   1 tablet at 06/14/20 0932  . ondansetron (ZOFRAN) tablet 4 mg  4 mg Oral Q6H PRN Lenore Cordia, MD       Or  . ondansetron (ZOFRAN) injection 4 mg  4 mg Intravenous Q6H PRN Zada Finders R, MD      . timolol (TIMOPTIC) 0.5 % ophthalmic solution 1 drop  1 drop Both Eyes Daily Lenore Cordia, MD   1 drop at 06/14/20 0828  . vitamin B-12 (CYANOCOBALAMIN) tablet 1,000 mcg  1,000 mcg Oral Q1200 Zada Finders R, MD   1,000 mcg at 06/14/20 1146    VITAL SIGNS: BP 129/64   Pulse  85   Temp (!) 97.3 F (36.3 C) (Oral)   Resp 16   Ht 6' (1.829 m)   Wt 156 lb 1.4 oz (70.8 kg)   SpO2 99%   BMI 21.17 kg/m  Filed Weights   06/08/20 1703 06/12/20 0420 06/14/20 0445  Weight: 156 lb 1.4 oz (70.8 kg) 157 lb 6.5 oz (71.4 kg) 156 lb 1.4 oz (70.8 kg)    Estimated body mass index is 21.17 kg/m as calculated from the following:   Height as  of this encounter: 6' (1.829 m).   Weight as of this encounter: 156 lb 1.4 oz (70.8 kg).  LABS: CBC:    Component Value Date/Time   WBC 5.8 06/14/2020 0436   HGB 8.4 (L) 06/14/2020 0436   HGB 19.3 (H) 08/24/2012 1632   HCT 26.7 (L) 06/14/2020 0436   HCT 59.8 (H) 08/24/2012 1632   PLT 229 06/14/2020 0436   PLT 551 (H) 08/24/2012 1632   MCV 88.1 06/14/2020 0436   MCV 84 08/24/2012 1632   NEUTROABS 3.6 06/14/2020 0436   LYMPHSABS 0.8 06/14/2020 0436   MONOABS 0.5 06/14/2020 0436   EOSABS 0.0 06/14/2020 0436   BASOSABS 0.2 (H) 06/14/2020 0436   Comprehensive Metabolic Panel:    Component Value Date/Time   NA 130 (L) 06/14/2020 0436   NA 137 08/24/2012 1632   K 3.1 (L) 06/14/2020 0436   K 4.2 08/24/2012 1632   CL 101 06/14/2020 0436   CL 108 (H) 08/24/2012 1632   CO2 23 06/14/2020 0436   CO2 21 08/24/2012 1632   BUN 35 (H) 06/14/2020 0436   BUN 18 08/24/2012 1632   CREATININE 1.27 (H) 06/14/2020 0436   CREATININE 0.97 08/24/2012 1632   GLUCOSE 90 06/14/2020 0436   GLUCOSE 113 (H) 08/24/2012 1632   CALCIUM 10.3 06/14/2020 0436   CALCIUM 9.6 08/24/2012 1632   AST 27 06/04/2020 1006   AST 42 (H) 08/24/2012 1632   ALT 11 06/04/2020 1006   ALT 41 08/24/2012 1632   ALKPHOS 66 06/04/2020 1006   ALKPHOS 155 (H) 08/24/2012 1632   BILITOT 1.0 06/04/2020 1006   BILITOT 0.9 08/24/2012 1632   PROT 9.1 (H) 06/04/2020 1006   PROT 8.6 (H) 08/24/2012 1632   ALBUMIN 2.6 (L) 06/04/2020 1006   ALBUMIN 4.1 08/24/2012 1632    RADIOGRAPHIC STUDIES: DG Chest 1 View  Result Date: 06/11/2020 CLINICAL DATA:  Shortness of breath  EXAM: CHEST  1 VIEW COMPARISON:  June 09, 2020 FINDINGS: Central catheter tip is in the superior vena cava. No pneumothorax. There is cardiomegaly with pulmonary vascularity within normal limits. There is airspace opacity in the left lower lobe. Elsewhere there is interstitial prominence in the mid lung regions. No appreciable adenopathy. No bone lesions. IMPRESSION: Airspace opacity consistent with pneumonia in the left lower lung region and to a lesser extent in each mid lung region. Appearance indicative of multifocal pneumonia. Heart remains prominent with pulmonary vascularity within normal limits. No adenopathy. Central catheter tip in superior vena cava without pneumothorax. Electronically Signed   By: Lowella Grip III M.D.   On: 06/11/2020 08:34   DG Chest 2 View  Result Date: 06/08/2020 CLINICAL DATA:  Chronic cough, shortness of breath EXAM: CHEST - 2 VIEW COMPARISON:  Prior chest x-ray 04/26/2020 FINDINGS: Tunneled right IJ central venous catheter present. The tip overlies the superior cavoatrial junction. Stable mild cardiomegaly. Mild pulmonary vascular congestion without overt edema. Significantly improved aeration compared to the prior radiograph. Small layering left pleural effusion with associated atelectasis. Stable chronic bronchitic changes and areas of linear scarring. No pneumothorax. No focal airspace infiltrate. IMPRESSION: 1. Overall, significantly improved aeration compared to prior imaging from 04/26/2020. 2. Stable cardiomegaly and pulmonary vascular congestion without overt edema. 3. Small left pleural effusion with associated atelectasis. 4. Tunneled right IJ central line in good position. Electronically Signed   By: Jacqulynn Cadet M.D.   On: 06/08/2020 14:04   NM Pulmonary Perfusion  Result Date: 06/11/2020 CLINICAL DATA:  Shortness of breath EXAM:  NUCLEAR MEDICINE PERFUSION LUNG SCAN TECHNIQUE: Perfusion images were obtained in multiple projections after  intravenous injection of radiopharmaceutical. Views: Anterior, posterior, left lateral, right lateral, RPO, LPO, RAO, LAO RADIOPHARMACEUTICALS:  4.37 mCi Tc-68mMAA IV COMPARISON:  Chest radiograph June 11, 2020 FINDINGS: There is slight decreased radiotracer uptake in each apex, nonsegmental. Elsewhere, the distribution of uptake on the perfusion study is normal. IMPRESSION: Slightly decreased radiotracer uptake in the apex regions, nonsegmental, a finding of questionable significance. This study is considered low probability for pulmonary embolus. Electronically Signed   By: WLowella GripIII M.D.   On: 06/11/2020 12:02   [place] Img Lower Bilateral (DVT)  Result Date: 06/09/2020 CLINICAL DATA:  Bilateral lower extremity pain 1 week. EXAM: BILATERAL LOWER EXTREMITY VENOUS DOPPLER ULTRASOUND TECHNIQUE: Gray-scale sonography with compression, as well as color and duplex ultrasound, were performed to evaluate the deep venous system(s) from the level of the common femoral vein through the popliteal and proximal calf veins. COMPARISON:  None. FINDINGS: VENOUS Normal compressibility of the common femoral, superficial femoral, and popliteal veins, as well as the visualized calf veins bilaterally. Visualized portions of profunda femoral vein and great saphenous vein unremarkable bilaterally. No filling defects to suggest DVT on grayscale or color Doppler imaging. Doppler waveforms show normal direction of venous flow, normal respiratory plasticity and response to augmentation bilaterally. OTHER None. Limitations: none IMPRESSION: Negative bilateral lower extremity Doppler evaluation. Electronically Signed   By: DMarin OlpM.D.   On: 06/09/2020 17:09   IR Fluoro Guide CV Line Left  Result Date: 05/18/2020 CLINICAL DATA:  AML, needs venous access for treatment regimen. Renal insufficiency. EXAM: TUNNELED CENTRAL VENOUS CATHETER PLACEMENT WITH ULTRASOUND AND FLUOROSCOPIC GUIDANCE TECHNIQUE: The  procedure, risks, benefits, and alternatives were explained to the patient. Questions regarding the procedure were encouraged and answered. The patient understands and consents to the procedure. Patency of the right IJ vein was confirmed with ultrasound with image documentation. An appropriate skin site was determined. Region was prepped using maximum barrier technique including cap and mask, sterile gown, sterile gloves, large sterile sheet, and Chlorhexidine as cutaneous antisepsis. The region was infiltrated locally with 1% lidocaine. Under real-time ultrasound guidance, the right IJ vein was accessed with a 21 gauge micropuncture needle; the needle tip within the vein was confirmed with ultrasound image documentation. 31F single-lumen cuffed PowerLine tunneled from a right anterior chest wall approach to the dermatotomy site. Needle exchanged over the 018 guidewire for transitional dilator, through which the catheter which had been cut to 22 cm was advanced under intermittent fluoroscopy, positioned with its tip at the cavoatrial junction. Spot chest radiograph confirms good catheter position. No pneumothorax. Catheter was flushed per protocol. Catheter secured externally with O Prolene suture. The right IJ dermatotomy site was closed with Dermabond. COMPLICATIONS: COMPLICATIONS None immediate FLUOROSCOPY TIME:  Less than 0.1 minute; 0.25 mGy COMPARISON:  None IMPRESSION: 1. Technically successful placement of tunneled right IJ tunneled single-lumen power injectable catheter with ultrasound and fluoroscopic guidance. Ready for routine use. Electronically Signed   By: DLucrezia EuropeM.D.   On: 05/18/2020 11:33   DG Chest Port 1 View  Result Date: 06/09/2020 CLINICAL DATA:  Dyspnea, tachypnea, tachycardia EXAM: PORTABLE CHEST 1 VIEW COMPARISON:  06/08/2020 chest radiograph. FINDINGS: Right internal jugular central venous catheter terminates in the lower third of the SVC. Stable cardiomediastinal silhouette with  mild cardiomegaly. No pneumothorax. Small left pleural effusion is unchanged. No significant right pleural effusion. Diffuse hazy parahilar interstitial lung opacities, increased.  IMPRESSION: 1. Stable mild cardiomegaly. Diffuse hazy parahilar interstitial lung opacities, increased, favor pulmonary edema. 2. Stable small left pleural effusion. Electronically Signed   By: Ilona Sorrel M.D.   On: 06/09/2020 15:21   CT BONE MARROW BIOPSY & ASPIRATION  Result Date: 06/12/2020 CLINICAL DATA:  Acute myeloid leukemia and need for bone marrow biopsy. EXAM: CT GUIDED BONE MARROW ASPIRATION AND BIOPSY ANESTHESIA/SEDATION: Versed 1.0 mg IV, Fentanyl 50 mcg IV Total Moderate Sedation Time:   10 minutes. The patient's level of consciousness and physiologic status were continuously monitored during the procedure by Radiology nursing. PROCEDURE: The procedure risks, benefits, and alternatives were explained to the patient. Questions regarding the procedure were encouraged and answered. The patient understands and consents to the procedure. A time out was performed prior to initiating the procedure. The right gluteal region was prepped with chlorhexidine. Sterile gown and sterile gloves were used for the procedure. Local anesthesia was provided with 1% Lidocaine. Under CT guidance, an 11 gauge On Control bone cutting needle was advanced from a posterior approach into the right iliac bone. Needle positioning was confirmed with CT. Initial non heparinized and heparinized aspirate samples were obtained of bone marrow. Core biopsy was performed via the On Control drill needle. COMPLICATIONS: None FINDINGS: Inspection of initial aspirate did reveal visible particles. Intact core biopsy sample was obtained. IMPRESSION: CT guided bone marrow biopsy of right posterior iliac bone with both aspirate and core samples obtained. Electronically Signed   By: Aletta Edouard M.D.   On: 06/12/2020 12:51    PERFORMANCE STATUS (ECOG) : 3 -  Symptomatic, >50% confined to bed  Review of Systems Unless otherwise noted, a complete review of systems is negative.  Physical Exam General: NAD Pulmonary: Unlabored GU: Catheter in place Extremities: no edema, no joint deformities Skin: no rashes Neurological: Weakness but otherwise nonfocal  IMPRESSION: Routine follow-up visit today with patient and wife.  Patient seems clinically improved and stable.  He denies any significant changes or concerns.  Patient met earlier with hospitalist and verbalized desire to pursue hospice.  Spoke with patient and wife about that decision.  They also confirmed a desire to pursue hospice at home but want to speak with Dr. Mike Gip before making any final decisions.  They recognize that hospice would be focus more on comfort and would entail discontinuation of treatment and transfusions.  I spoke with Flo Shanks with hospice who will also see patient.  PLAN: -Best supportive care -Probable discharge home with hospice -Referral to hospice liaison -Will follow  Case and plan discussed with Drs. Sreenath and Cor[place]an   Patient expressed understanding and was in agreement with this plan. He also understands that He can call the clinic at any time with any questions, concerns, or complaints.     Time Total: 30 minutes  Visit consisted of counseling and education dealing with the complex and emotionally intense issues of symptom management and palliative care in the setting of serious and potentially life-threatening illness.Greater than 50%  of this time was spent counseling and coordinating care related to the above assessment and plan.  Signed by: Altha Harm, PhD, NP-C

## 2020-06-14 NOTE — Progress Notes (Signed)
PROGRESS NOTE    KELTEN ENOCHS  WTU:[id number] DOB: [birth year] DOA: 06/08/2020 PCP: [name], MD   Brief Narrative:  74 year old male with history of AML on active chemotherapy admitted for weakness and fever.  Fever has resolved.  No clear infectious focus.  Oncology following.  Patient status post bone marrow biopsy to guide further treatment.  Deciding between hospice care and discharge to skilled nursing facility.  Decision making will depend upon results of bone marrow biopsy.  Discussed goals of care disposition planning with the patient and his wife today.  Patient indicates that he would like to stop aggressive intervention and chemotherapy and instead return home with hospice and focus on his quality of life.  I also spoke with Billey Chang and notified oncologist Dr. Mike Gip of the patient's decision.  Dr. Mike Gip will speak with the patient today.  I have notified social work will reach out to hospice liaison.    Assessment & Plan:   Principal Problem:   SIRS (systemic inflammatory response syndrome) (HCC) Active Problems:   BPH (benign prostatic hyperplasia)   Crohn disease (HCC)   Iron deficiency anemia   CKD (chronic kidney disease) stage 3, GFR 30-59 ml/min   Acute myeloid leukemia not having achieved remission (HCC)   Sepsis (HCC)   Protein-calorie malnutrition, severe   Palliative care encounter  Brief acute respiratory failure Acute respiratory distress x2 since admission... Stabilized now off supplemental oxygen On room air satting 100% -Stable now  -Acute respiratory failure likely due to volume overload -IV fluid given to the sepsis, sepsis protocol without bolus -on 06/09/2020 patient developed hypoxia, tachypnea, tachycardia, T-max of 100.4 Was thought was due to volume overload, patient was anxious, small dose of 0.5 Ativan given, patient was placed on nonrebreather mask, 40 mg of Lasix was given Patient responded well to treatment, currently has  been weaned off oxygen, vitals are stable Plan: Vitals per unit protocol Oxygen if necessary  Sepsis/SIRS - POA -upper respiratory symptoms (immunocompromised)/likely pneumonia Chest x-ray imaging consistent with possible pneumonia -patient is vaccinated, on admission SARS-CoV-2 screen negative Brief acute respiratory failure 06/09/2020, 06/10/2020 (T-max 100.4) -Improved sepsis physiology -Patient met the sepsis criteria on admission with temp of 100.9, tachypneic with respiratory rate of 23, tachycardic heart rate as high as 126,, creatinine elevated to 1.41 (immunocompromised patient) -Afebrile normotensive now Has been on IV antibiotics of vancomycin Valtrex,Flagyl, cefepime -Narrow antibiotics vancomycin Valtrex was discontinued due to elevated creatinine -Cultures remain negative, Flagyl also discontinued 06/12/2020 Plan: Continue cefepime Continue Flagyl for now No clear infectious focus identified.  Pneumonia versus malignancy related.  5 days of antibiotics after today.  Can likely DC IV antibiotics after last dose today.  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 Mike Gip.  -On chemotherapy with azacitidine and venetoclax. -on  venetoclax  -Bone biopsy 06/12/2020 Plan: Lengthy discussion with patient and his wife today regarding disposition planning overall goals of care.  Patient is wanting to stop chemotherapy and is to return home with hospice services and focus on quality of life.  I ensured the understood what this meant and that all aggressive interventions will be stopped.  They are in agreement.  They are waiting to hear from Dr. Mike Gip will speak with patient today.  Anemia of chronic disease and iron deficiency: -Stable monitoring H&H Has been requiring frequent transfusions as an outpatient.   CKD stage IIIb: - Cr.  1.37, 1.41 >>  1.29  >>1.60 >> 1.71 >>  1.46 >>1.47 today  Chronic appears stable. Continue monitor.  Crohn's disease: Has intermittent diarrhea, currently stable Stable no signs of flareup  BPH with chronic urinary retention: -Remained stable Patient self catheterizes.  Generalized weakness: Request PT/OT eval.  Ethics: DNR-patient confirms  --was entertained possible hospice After reviewing medical records, ongoing cancer, immunocompromise, presenting with source, sepsis ruled out.  Nevertheless due to comorbidities, patient frailty and age anticipated prognosis to be poor. Palliative care consult -determine goal of care   DVT prophylaxis: Lovenox Code Status: DNR Family Communication: Wife [name] via phone [phone] on 9/22 Disposition Plan: Status is: Inpatient  Remains inpatient appropriate because:IV treatments appropriate due to intensity of illness or inability to take PO and Inpatient level of care appropriate due to severity of illness   Dispo: The patient is from: Home              Anticipated d/c is to: SNF              Anticipated d/c date is: 1 day              Patient currently is not medically stable to d/c.  Remains on IV broad-spectrum antibiotics.  We will plan to continue while patient is hospitalized.  No clear infectious focus identified.  Disposition planning in progress.  Anticipate patient will elect to return home with hospice services.  Pending oncology follow-up.   Consultants:   Oncology  Palliative care  Procedures:   Bone marrow biopsy, 06/12/2020  Antimicrobials:  Metronidazole Cefepime   Subjective: Seen and examined.  Complains of fatigue this morning but no other specific complaints.  No pain reported.  Objective: Vitals:   06/13/20 1926 06/14/20 0436 06/14/20 0445 06/14/20 0814  BP: (!) 143/69 133/69  131/65  Pulse: 85 84  85  Resp: 16 16    Temp: 98 F (36.7 C) 98 F (36.7 C)  98.3 F (36.8 C)  TempSrc: Oral Oral  Oral  SpO2: 99%  100%  99%  Weight:   70.8 kg   Height:        Intake/Output Summary (Last 24 hours) at 06/14/2020 1049 Last data filed at 06/14/2020 0449 Gross per 24 hour  Intake 222.77 ml  Output 1350 ml  Net -1127.23 ml   Filed Weights   06/08/20 1703 06/12/20 0420 06/14/20 0445  Weight: 70.8 kg 71.4 kg 70.8 kg    Examination:  General exam: No acute distress, appears frail Respiratory system: Bibasilar crackles.  Normal work of breathing.  Room air Cardiovascular system: S1 & S2 heard, RRR. No JVD, murmurs, rubs, gallops or clicks. No pedal edema. Gastrointestinal system: Thin.  Nontender, nondistended.  Normal bowel sounds Central nervous system: Alert and oriented. No focal neurological deficits. Extremities: Symmetric 5 x 5 power. Skin: No rashes, lesions or ulcers Psychiatry: Judgement and insight appear normal. Mood & affect appropriate.     Data Reviewed: I have personally reviewed following labs and imaging studies  CBC: Recent Labs  Lab 06/08/20 0824 06/08/20 0824 06/09/20 0417 06/10/20 0244 06/11/20 0530 06/12/20 0757 06/14/20 0436  WBC 6.7   < > 5.6 8.0 6.9 6.3 5.8  NEUTROABS 3.8  --   --  4.6 4.3 4.0 3.6  HGB 9.9*   < > 8.8* 9.1* 8.7* 8.9* 8.4*  HCT 30.0*   < > 25.9* 28.6* 27.3* 27.7* 26.7*  MCV 85.7   < > 84.6 88.3 87.2 87.4 88.1  PLT 153   < >  159 206 185 210 229   < > = values in this interval not displayed.   Basic Metabolic Panel: Recent Labs  Lab 06/09/20 0417 06/09/20 0417 06/10/20 0244 06/11/20 0530 06/12/20 0757 06/13/20 0634 06/14/20 0436  NA 130*   < > 132* 131* 130* 132* 130*  K 3.3*   < > 3.6 3.1* 2.9* 3.4* 3.1*  CL 98   < > 100 101 103 102 101  CO2 23   < > 22 22 22 22 23   GLUCOSE 84   < > 88 85 87 89 90  BUN 24*   < > 35* 38* 31* 35* 35*  CREATININE 1.29*   < > 1.60* 1.71* 1.46* 1.47* 1.27*  CALCIUM 8.3*   < > 8.7* 8.8* 9.2 10.3 10.3  MG 1.5*  --   --  1.9  --   --   --   PHOS  --   --   --  4.8*  --   --   --    < > = values in this  interval not displayed.   GFR: Estimated Creatinine Clearance: 51.9 mL/min (A) (by C-G formula based on SCr of 1.27 mg/dL (H)). Liver Function Tests: No results for input(s): AST, ALT, ALKPHOS, BILITOT, PROT, ALBUMIN in the last 168 hours. No results for input(s): LIPASE, AMYLASE in the last 168 hours. No results for input(s): AMMONIA in the last 168 hours. Coagulation Profile: Recent Labs  Lab 06/09/20 0417  INR 1.7*   Cardiac Enzymes: No results for input(s): CKTOTAL, CKMB, CKMBINDEX, TROPONINI in the last 168 hours. BNP (last 3 results) No results for input(s): PROBNP in the last 8760 hours. HbA1C: No results for input(s): HGBA1C in the last 72 hours. CBG: No results for input(s): GLUCAP in the last 168 hours. Lipid Profile: No results for input(s): CHOL, HDL, LDLCALC, TRIG, CHOLHDL, LDLDIRECT in the last 72 hours. Thyroid Function Tests: No results for input(s): TSH, T4TOTAL, FREET4, T3FREE, THYROIDAB in the last 72 hours. Anemia Panel: No results for input(s): VITAMINB12, FOLATE, FERRITIN, TIBC, IRON, RETICCTPCT in the last 72 hours. Sepsis Labs: Recent Labs  Lab 06/08/20 1456 06/08/20 1514 06/09/20 0417 06/09/20 0856 06/09/20 1443 06/09/20 1833 06/10/20 0244 06/11/20 0530 06/12/20 0757  PROCALCITON   < >  --  34.65  --   --   --  >150.00 >150.00 105.10  LATICACIDVEN  --  2.2*  --  1.1 3.2* 1.5  --   --   --    < > = values in this interval not displayed.    Recent Results (from the past 240 hour(s))  Culture, blood (Routine X 2) w Reflex to ID Panel     Status: None   Collection Time: 06/08/20  3:14 PM   Specimen: BLOOD  Result Value Ref Range Status   Specimen Description   Final    BLOOD RIGHT ARM Performed at [facility], [address], [place], [place] [address]    Special Requests   Final    BOTTLES DRAWN AEROBIC AND ANAEROBIC Blood Culture adequate volume Performed at [facility], [address] [place], [place] [address]     Culture   Final    NO GROWTH 5 DAYS Performed at [facility], [address], [place], [place] [address]    Report Status 06/13/2020 FINAL  Final  Culture, blood (Routine X 2) w Reflex to ID Panel     Status: None   Collection Time: 06/08/20  3:33  PM   Specimen: BLOOD  Result Value Ref Range Status   Specimen Description   Final    BLOOD LEFT ARM Performed at [facility], [address] [place], [place] [address]    Special Requests   Final    BOTTLES DRAWN AEROBIC AND ANAEROBIC Blood Culture adequate volume Performed at [facility], [address] [place], [place] [address]    Culture   Final    NO GROWTH 5 DAYS Performed at [facility], [address] [place], [place] [address]    Report Status 06/13/2020 FINAL  Final  SARS Coronavirus 2 by RT PCR (hospital order, performed in [facility] hospital lab) Nasopharyngeal Nasopharyngeal Swab     Status: None   Collection Time: 06/08/20  5:23 PM   Specimen: Nasopharyngeal Swab  Result Value Ref Range Status   SARS Coronavirus 2 NEGATIVE NEGATIVE Final    Comment: (NOTE) SARS-CoV-2 target nucleic acids are NOT DETECTED.  The SARS-CoV-2 RNA is generally detectable in upper and lower respiratory specimens during the acute phase of infection. The lowest concentration of SARS-CoV-2 viral copies this assay can detect is 250 copies / mL. A negative result does not preclude SARS-CoV-2 infection and should not be used as the sole basis for treatment or other patient management decisions.  A negative result may occur with improper specimen collection / handling, submission of specimen other than nasopharyngeal swab, presence of viral mutation(s) within the areas targeted by this assay, and inadequate number of viral copies (<250 copies / mL). A negative result must be combined with clinical observations, patient history, and epidemiological information.  Fact Sheet for Patients:     StrictlyIdeas.no  Fact Sheet for Healthcare Providers: BankingDealers.co.za  This test is not yet approved or  cleared by the [place] FDA and has been authorized for detection and/or diagnosis of SARS-CoV-2 by FDA under an Emergency Use Authorization (EUA).  This EUA will remain in effect (meaning this test can be used) for the duration of the COVID-19 declaration under Section 564(b)(1) of the Act, 21 U.S.C. section 360bbb-3(b)(1), unless the authorization is terminated or revoked sooner.  Performed at [facility], [address], [place], [place] [address]   Urine culture     Status: None   Collection Time: 06/08/20  5:23 PM   Specimen: Urine, Random  Result Value Ref Range Status   Specimen Description   Final    URINE, RANDOM Performed at [facility], [address] [place], [place] [address]    Special Requests   Final    NONE Performed at [facility], [address], [place], [place] [address]    Culture   Final    NO GROWTH Performed at [facility], [place] [address], [place], [place] [address]    Report Status 06/10/2020 FINAL  Final  MRSA PCR Screening     Status: None   Collection Time: 06/11/20  1:43 PM   Specimen: Nasopharyngeal  Result Value Ref Range Status   MRSA by PCR NEGATIVE NEGATIVE Final    Comment:        The GeneXpert MRSA Assay (FDA approved for NASAL specimens only), is one component of a comprehensive MRSA colonization surveillance program. It is not intended to diagnose MRSA infection nor to guide or monitor treatment for MRSA infections. Performed at [facility], [address] [place], [place] [address]          Radiology Studies: CT BONE  MARROW BIOPSY & ASPIRATION  Result Date: 06/12/2020 CLINICAL DATA:  Acute myeloid leukemia and need for bone marrow biopsy. EXAM: CT GUIDED BONE MARROW ASPIRATION AND BIOPSY  ANESTHESIA/SEDATION: Versed 1.0 mg IV, Fentanyl 50 mcg IV Total Moderate Sedation Time:   10 minutes. The patient's level of consciousness and physiologic status were continuously monitored during the procedure by Radiology nursing. PROCEDURE: The procedure risks, benefits, and alternatives were explained to the patient. Questions regarding the procedure were encouraged and answered. The patient understands and consents to the procedure. A time out was performed prior to initiating the procedure. The right gluteal region was prepped with chlorhexidine. Sterile gown and sterile gloves were used for the procedure. Local anesthesia was provided with 1% Lidocaine. Under CT guidance, an 11 gauge On Control bone cutting needle was advanced from a posterior approach into the right iliac bone. Needle positioning was confirmed with CT. Initial non heparinized and heparinized aspirate samples were obtained of bone marrow. Core biopsy was performed via the On Control drill needle. COMPLICATIONS: None FINDINGS: Inspection of initial aspirate did reveal visible particles. Intact core biopsy sample was obtained. IMPRESSION: CT guided bone marrow biopsy of right posterior iliac bone with both aspirate and core samples obtained. Electronically Signed   By: Aletta Edouard M.D.   On: 06/12/2020 12:51        Scheduled Meds: . allopurinol  150 mg Oral Daily  . amLODipine  5 mg Oral Daily  . Chlorhexidine Gluconate Cloth  6 each Topical Daily  . enoxaparin (LOVENOX) injection  40 mg Subcutaneous Q24H  . feeding supplement (ENSURE ENLIVE)  237 mL Oral TID BM  . finasteride  5 mg Oral QPM  . folic acid  1 mg Oral [id number]  . latanoprost  1 drop Both Eyes QHS  . mouth rinse  15 mL Mouth Rinse BID  . multivitamin with minerals  1 tablet Oral Daily  . timolol  1 drop Both Eyes Daily  . vitamin B-12  1,000 mcg Oral Q1200   Continuous Infusions: . sodium chloride Stopped (06/13/20 2118)  . ceFEPime (MAXIPIME) IV 2 g  (06/14/20 0841)     LOS: 5 days    Time spent: 25 minutes    Sidney Ace, MD [facility] Pager [phone]  If 7PM-7AM, please contact night-coverage 06/14/2020, 10:49 AM

## 2020-06-14 NOTE — Care Management Important Message (Signed)
Important Message  Patient Details  Name: Ruben [name] MRN: [id number] Date of Birth: 1946/06/26   Medicare Important Message Given:  Yes     Dannette Barbara 06/14/2020, 1:57 PM

## 2020-06-14 NOTE — Progress Notes (Signed)
Mobility Specialist - Progress Note   06/14/20 1700  Mobility  Activity Refused mobility  Mobility performed by Mobility specialist    Pt declined mobility stating, "I just want to rest". Will attempt session at another date.    Kathee Delton Mobility Specialist 06/14/20, 5:23 PM

## 2020-06-14 NOTE — Progress Notes (Signed)
Physical Therapy Treatment Patient Details Name: Ruben [name] MRN: [id number] DOB: 1945/10/10 Today's Date: 06/14/2020    History of Present Illness 74 y.o. 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 Adm for SIRS    PT Comments    Pt ready for session.  Wife in room and very attentive directing most of session.  OOB with min a x 1 with encouragement for wife to let him try on his own.  Stood with min guard and is able to walk 1 1/2 laps with HHA from wife.  Pt reports some weakness but is encouraged by her to walk further.  Upon return to room he increases his speed due to fatigue and is educated to slow down for safety.  Returned to supine without assist.  Discussed RW use for when fatigued for energy conservation and balance.    Given improvement in mobility, adjusted discharge recommendations for HHPT.   Follow Up Recommendations  Home health PT;Supervision for mobility/OOB     Equipment Recommendations  Rolling walker with 5" wheels;3in1 (PT)    Recommendations for Other Services       Precautions / Restrictions Precautions Precautions: Fall Precaution Comments: requires assist to sequence all movement Restrictions Weight Bearing Restrictions: No    Mobility  Bed Mobility Overal bed mobility: Needs Assistance Bed Mobility: Supine to Sit;Sit to Supine     Supine to sit: Min assist Sit to supine: Min guard      Transfers Overall transfer level: Needs assistance Equipment used: None Transfers: Sit to/from Stand Sit to Stand: Min guard            Ambulation/Gait Ambulation/Gait assistance: Min  guard Gait Distance (Feet): 210 Feet Assistive device: 1 person hand held assist Gait Pattern/deviations: Step-through pattern Gait velocity: decreased   General Gait Details: generally steady but reports weakness   Stairs             Wheelchair Mobility    Modified Rankin (Stroke Patients Only)       Balance Overall balance assessment: Needs assistance Sitting-balance support: No upper extremity supported;Feet unsupported Sitting balance-Leahy Scale: Good     Standing balance support: Bilateral upper extremity supported Standing balance-Leahy Scale: Good                              Cognition Arousal/Alertness: Awake/alert Behavior During Therapy: Flat affect Overall Cognitive Status: Within Functional Limits for tasks assessed                                        Exercises      General Comments        Pertinent Vitals/Pain Pain Assessment: No/denies pain    Home Living                      Prior Function            PT Goals (current goals can now be found in the care plan section) Progress towards PT goals: Progressing toward goals    Frequency  PT Plan Discharge plan needs to be updated    Co-evaluation              AM-PAC PT "6 Clicks" Mobility   Outcome Measure  Help needed turning from your back to your side while in a flat bed without using bedrails?: None Help needed moving from lying on your back to sitting on the side of a flat bed without using bedrails?: None Help needed moving to and from a bed to a chair (including a wheelchair)?: A Little Help needed standing up from a chair using your arms (e.g., wheelchair or bedside chair)?: A Little Help needed to walk in hospital room?: A Little Help needed climbing 3-5 steps with a railing? : A Little 6 Click Score: 20    End of Session Equipment Utilized During Treatment: Gait belt Activity Tolerance: Patient tolerated  treatment well Patient left: with family/visitor present;with chair alarm set;in bed;with bed alarm set Nurse Communication: Mobility status;Other (comment)       Time: 5038-8828 PT Time Calculation (min) (ACUTE ONLY): 11 min  Charges:  $Gait Training: 8-22 mins                     Chesley Noon, PTA 06/14/20, 12:26 PM

## 2020-06-15 ENCOUNTER — Inpatient Hospital Stay: Payer: Medicare Other | Admitting: Hospice and Palliative Medicine

## 2020-06-15 ENCOUNTER — Inpatient Hospital Stay: Payer: Medicare Other

## 2020-06-15 LAB — CBC WITH DIFFERENTIAL/PLATELET
Abs Immature Granulocytes: 0.3 10*3/uL — ABNORMAL HIGH (ref 0.00–0.07)
Basophils Absolute: 0 10*3/uL (ref 0.0–0.1)
Basophils Relative: 0 %
Eosinophils Absolute: 0.1 10*3/uL (ref 0.0–0.5)
Eosinophils Relative: 1 %
HCT: 27.8 % — ABNORMAL LOW (ref 39.0–52.0)
Hemoglobin: 9 g/dL — ABNORMAL LOW (ref 13.0–17.0)
Lymphocytes Relative: 14 %
Lymphs Abs: 0.9 10*3/uL (ref 0.7–4.0)
MCH: 28 pg (ref 26.0–34.0)
MCHC: 32.4 g/dL (ref 30.0–36.0)
MCV: 86.3 fL (ref 80.0–100.0)
Metamyelocytes Relative: 4 %
Monocytes Absolute: 0.3 10*3/uL (ref 0.1–1.0)
Monocytes Relative: 5 %
Neutro Abs: 4.9 10*3/uL (ref 1.7–7.7)
Neutrophils Relative %: 76 %
Platelets: 218 10*3/uL (ref 150–400)
RBC: 3.22 MIL/uL — ABNORMAL LOW (ref 4.22–5.81)
RDW: 19.2 % — ABNORMAL HIGH (ref 11.5–15.5)
Smear Review: NORMAL
WBC: 6.5 10*3/uL (ref 4.0–10.5)
nRBC: 24.8 % — ABNORMAL HIGH (ref 0.0–0.2)
nRBC: 28 /100 WBC — ABNORMAL HIGH

## 2020-06-15 LAB — AMMONIA: Ammonia: 27 umol/L (ref 9–35)

## 2020-06-15 LAB — PATHOLOGIST SMEAR REVIEW

## 2020-06-15 NOTE — Unmapped (Signed)
I LVM with patient Howard Villarreal to reschedule canceled 9/27 [place] appt.    Gina L Powe

## 2020-06-15 NOTE — Unmapped (Signed)
Pt is in the hospital.  Would like a call back in 4 weeks because he's currently off of medication.

## 2020-06-15 NOTE — Progress Notes (Addendum)
[facility] hospital liaison note:  Writer met with patient and his wife on 9/23 at request of Palliative NP Josh Borders  to initiate education regarding hospice services, philosophy and team approach to care with understanding voiced. At that time patient had his wife wished to speak with his oncologist Dr. Corcoran before making the decision regarding hospice services/stopping treatment.  After lengthy discussions regarding discharge plan this morning with patient, his wife and his daughter as well as attending physician Dr. Sreenath and Palliative NP Josh Borders the decision was made that Mr. Rogers would discharge home and not to an inpatient hospice unit. DME needs confirmed and ordered. DME will be delivered tomorrow between 10-11 am. Patient and his wife are aware of and requested that medication regime be simplified at [facility]. Medications focused on comfort are most important. Patient will require non Emergent transport with signed out of facility DNR in place. Hospital care team updated. Hospice contact numbers and information given to Mrs. Rogers.  Thank you for the opportunity to be involved in the care of this patient and his family.  Karen Robertson BSN, RN, [facility] Hospital Liaison [facility] [phone]    

## 2020-06-15 NOTE — Progress Notes (Signed)
[facility] Hematology/Oncology Progress Note  Date of admission: 06/08/2020  Hospital day: 06/14/2020  Chief Complaint: Ruben [name] is a 74 y.o. 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  Subjective:  He remains fatigued.  He notes intermittent difficulty with thinking.  Notes issue with losing time after sedation for bone marrow.   Social History: The patient is accompanied by his wife and daughter today.  Allergies: No Known Allergies  Scheduled Medications: . Chlorhexidine Gluconate Cloth  6 each Topical Daily  . enoxaparin (LOVENOX) injection  40 mg Subcutaneous Q24H  . feeding supplement (ENSURE ENLIVE)  237 mL Oral TID BM  . finasteride  5 mg Oral QPM  . latanoprost  1 drop Both Eyes QHS  . mouth rinse  15 mL Mouth Rinse BID  . timolol  1 drop Both Eyes Daily    Review of Systems  Constitutional: Negative.  Negative for chills, fever and malaise/fatigue.  HENT: Negative.  Negative for congestion, nosebleeds and sore throat.   Eyes: Negative.  Negative for blurred vision and double vision.  Respiratory: Negative.  Negative for cough, sputum production and shortness of breath.   Cardiovascular: Negative.  Negative for chest pain, orthopnea and leg swelling.  Gastrointestinal: Negative for abdominal pain, blood in stool, melena, nausea and vomiting.       Decreased appetite.  Genitourinary: Negative for dysuria and urgency.       Decreased fluid intake.  Cathter in place.  Musculoskeletal: Negative for back pain and neck pain.  Skin: Negative.  Negative for rash.  Neurological: Positive for weakness (general). Negative for dizziness, sensory change, speech change, focal weakness and headaches.  Endo/Heme/Allergies: Negative.  Does not bruise/bleed easily.  Psychiatric/Behavioral: Negative for depression and memory  loss. The patient is not nervous/anxious and does not have insomnia.     PERFORMANCE STATUS (ECOG):  3  Physical Exam: Blood pressure 132/67, pulse 86, temperature 98.4 F (36.9 C), temperature source Oral, resp. rate 18, height 6' (1.829 m), weight 156 lb 1.4 oz (70.8 kg), SpO2 94 %.   Physical Exam Constitutional:      General: He is not in acute distress.    Appearance: He is not toxic-appearing.     Comments: Chronically ill appearing gentleman engaged in conversation.  HENT:     Head: Normocephalic and atraumatic.     Comments: Thin gray hair.  Temporal wasting. Eyes:     General: No scleral icterus.    Conjunctiva/sclera: Conjunctivae normal.  Musculoskeletal:        General: No swelling.  Skin:    General: Skin is warm and dry.     Coloration: Skin is not jaundiced.     Findings: No erythema or rash.  Neurological:     General: No focal deficit present.     Mental Status: He is alert and oriented to person, place, and time.  Psychiatric:        Mood and Affect: Mood normal.        Behavior: Behavior normal.        Thought Content: Thought content normal.        Judgment: Judgment normal.      Results for orders placed or performed during the hospital encounter of 06/08/20 (from the past 48 hour(s))  Basic metabolic panel     Status: Abnormal   Collection Time: 06/13/20  6:34 AM  Result Value Ref Range   Sodium 132 (L) 135 - 145 mmol/L   Potassium 3.4 (L) 3.5 - 5.1 mmol/L   Chloride 102 98 - 111 mmol/L   CO2 22 22 - 32 mmol/L   Glucose, Bld 89 70 - 99 mg/dL    Comment: Glucose reference range applies only to samples taken after fasting for at least 8 hours.   BUN 35 (H) 8 - 23 mg/dL   Creatinine, Ser 1.47 (H) 0.61 - 1.24 mg/dL   Calcium 10.3 8.9 - 10.3 mg/dL   GFR calc non Af Amer 47 (L) >60 mL/min   GFR calc Af Amer 54 (L) >60 mL/min   Anion gap 8 5 - 15    Comment: Performed at [facility], [address] [place], [place] [address]  Basic  metabolic panel     Status: Abnormal   Collection Time: 06/14/20  4:36 AM  Result Value Ref Range   Sodium 130 (L) 135 - 145 mmol/L   Potassium 3.1 (L) 3.5 - 5.1 mmol/L   Chloride 101 98 - 111 mmol/L   CO2 23 22 - 32 mmol/L   Glucose, Bld 90 70 - 99 mg/dL    Comment: Glucose reference range applies only to samples taken after fasting for at least 8 hours.   BUN 35 (H) 8 - 23 mg/dL   Creatinine, Ser 1.27 (H) 0.61 - 1.24 mg/dL   Calcium 10.3 8.9 - 10.3 mg/dL   GFR calc non Af Amer 56 (L) >60 mL/min   GFR calc Af Amer >60 >60 mL/min   Anion gap 6 5 - 15    Comment: Performed at [facility], [address] [place], [place] [address]  CBC with Differential/Platelet     Status: Abnormal   Collection Time: 06/14/20  4:36 AM  Result Value Ref Range   WBC 5.8 4.0 - 10.5 K/uL   RBC 3.03 (L) 4.22 - 5.81 MIL/uL   Hemoglobin 8.4 (L) 13.0 - 17.0 g/dL   HCT 26.7 (L) 39 - 52 %   MCV 88.1 80.0 - 100.0 fL   MCH 27.7 26.0 - 34.0 pg   MCHC 31.5 30.0 - 36.0 g/dL   RDW 19.2 (H) 11.5 - 15.5 %   Platelets 229 150 - 400 K/uL   nRBC 9.4 (H) 0.0 - 0.2 %   Neutrophils Relative % 62 %   Neutro Abs 3.6 1.7 - 7.7 K/uL   Lymphocytes Relative 14 %   Lymphs Abs 0.8 0.7 - 4.0 K/uL   Monocytes Relative 8 %   Monocytes Absolute 0.5 0 - 1 K/uL   Eosinophils Relative 0 %   Eosinophils Absolute 0.0 0 - 0 K/uL   Basophils Relative 4 %   Basophils Absolute 0.2 (H) 0 - 0 K/uL   WBC Morphology MILD LEFT SHIFT (1-5% METAS, OCC MYELO, OCC BANDS)    RBC Morphology HIDE    Smear Review Normal platelet morphology    Immature Granulocytes 12 %   Abs Immature Granulocytes 0.67 (H) 0.00 - 0.07 K/uL   Polychromasia PRESENT     Comment: Performed at [facility], [address], [place], [place] [address]   No results found.  Assessment:  Ruben [name] is a 74 y.o. 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 He was  admitted with weakness and fever suggestive of SIRS/febrile illness.  Bone marrow on 06/12/2020 revealed persistent myeloproliferative neoplasm with  no increase in blasts and severe myelofibrosis.  Symptomatically, he remains fatigued.  He would like to go home on Hospice.   Plan: 1. Acute myelogenous leukemia (AML)  Patient is currently day 23 of cycle #2 venetoclax and azacitidine.  Hematocrit 26.7, hemoglobin 8.4, platelets 229,000, WBC 5800 with an ANC of 3600.  Bone marrow on 06/13/2020 was reviewed with the patient and his wife.   No evidence of acute leukemia.   Bone marrow shows underlying nyelofibrosis.  Discuss patient's thoughts about continuation of therapy.  Patient adamant that he wishes time off treatment and taking pills.   He notes that cycle #2 was tougher on him and took all of his energy from him.   There was little quality of life left secondary to the treatment and its complications.  He expressed the need for time out to go home, be with his family and do things that he would like to do if he has the energy.   He would like to go out to dinner and to the beach or mountains if he gets stronger.  He stated that he felt comfortable with his decision to pursue Hospice.  His wife stated that he would need equipment at home including a hospital bed and a wheelchair.  Multiple questions asked and answered. 2.   Intermittent confusion  Patient notes intermittent episodes of difficulty thinking.  He was particularly set back after the sedation (Versed 1 mg and Fentanyl 50 mcg) with his bone marrow.  He has chronic hyponatremia (130) and hypercalcemia (10.3; corrected 11.49).  We discussed the importance of fluid and caloric intake.  He is not on supplemental calcium.  If patient remains hospitalized, would provide gentle hydration. 3.   Disposition  Patient would like to be discharged home to Hospice.  He wished to simplify his medications (ideally not take any).  His  medications were reviewed individually and several deleted per his request.  His wife was in agreement. 4.   Code status  DNI/DNI   I provided 49 minutes (7:25 PM - 8:14 PM) of face-to-face time during this encounter.  The majority of out time was spent discussing his goals of care.  Multiple questions were asked and answered.  He would like to follow-up in clinic on as as needed basis.   Lequita Asal, MD  06/15/2020, 1:36 AM

## 2020-06-15 NOTE — Progress Notes (Signed)
[place]  [phone] [phone]   Name: Ruben [name] Date: 06/15/2020 MRN: [id number]  DOB: [birth year]  Patient Care Team: [name], MD as PCP - General (Family Medicine) [name], MD (Inactive) (Gastroenterology) Dingeldein, Remo Lipps, MD (Ophthalmology) Lequita Asal, MD as Referring Physician (Hematology and Oncology) Dasher, Rayvon Char, MD as Referring Physician (Dermatology) Anthonette Legato, MD as Consulting Physician (Internal Medicine) Hollice Espy, MD as Consulting Physician (Urology) Crissie Sickles, MD as Referring Physician (Hematology and Oncology) Vladimir Crofts, MD as Consulting Physician (Neurology)    REASON FOR CONSULTATION: Ruben [name] is a 74 y.o. 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  Chest x-ray revealed possible multifocal pneumonia. Patient was referred to palliative care to help address goals and manage ongoing symptoms.. .   CODE STATUS: DNR  PAST MEDICAL HISTORY: Past Medical History:  Diagnosis Date  . Blood dyscrasia   . BPH (benign prostatic hyperplasia)   . Cancer ([place])    SKIN/ POLYCYTHEMIA VERA  . Chronic kidney disease    RENAL INSUFF (40%)  . Crohn disease ([place])   . Crohn's disease ([place])   . Glaucoma   . Glaucoma   . Gout   . History of chicken pox   . Myelofibrosis ([place])   . Nosebleed   . RBBB   . Right bundle branch block     PAST SURGICAL HISTORY:  Past Surgical History:  Procedure Laterality Date  . CATARACT EXTRACTION W/PHACO Left 07/13/2018   Procedure: CATARACT EXTRACTION PHACO AND INTRAOCULAR LENS PLACEMENT (IOC);  Surgeon: [name], MD;  Location: [facility] ORS;  Service:  Ophthalmology;  Laterality: Left;  [place] 00:39  CDE 5.14 Fluid pack lot # [id number]  . CATARACT EXTRACTION W/PHACO Right 08/03/2018   Procedure: CATARACT EXTRACTION PHACO AND INTRAOCULAR LENS PLACEMENT (IOC);  Surgeon: [name], MD;  Location: [facility] ORS;  Service: Ophthalmology;  Laterality: Right;  [place] 00:43.4 CDE 5.23 Fluid pack Lot # [id number]  . COLONOSCOPY WITH PROPOFOL N/A 03/27/2017   Procedure: COLONOSCOPY WITH PROPOFOL;  Surgeon: [name], MD;  Location: [facility] ENDOSCOPY;  Service: Endoscopy;  Laterality: N/A;  . IR FLUORO GUIDE CV LINE LEFT  05/18/2020  . MOHS SURGERY     EAR  . PROSTATE SURGERY  2008   Prostate Biopsy in [place] to elevated PSA.  per patient normal  . Skin Lesion Basal cell removed    . wart removal     from eyelid    HEMATOLOGY/ONCOLOGY HISTORY:  Oncology History  Acute myeloid leukemia not having achieved remission ([place])  03/07/2020 Initial Diagnosis   Acute myeloid leukemia not having achieved remission ([place])   05/21/2020 -  Chemotherapy   The patient had azaCITIDine (VIDAZA) chemo injection 150 mg, 78.5 mg/m2 = 142.5 mg, Subcutaneous,  Once, 1 of 4 cycles Administration: 150 mg (05/21/2020), 150 mg (05/22/2020), 150 mg (05/23/2020), 150 mg (05/24/2020), 150 mg (05/25/2020), 150 mg (05/29/2020), 150 mg (05/30/2020)  for chemotherapy treatment.      ALLERGIES:  has No Known Allergies.  MEDICATIONS:  Current Facility-Administered Medications  Medication Dose Route Frequency Provider Last Rate Last Admin  . 0.9 %  sodium chloride infusion   Intravenous PRN Deatra Stoy, MD   Paused at 06/13/20 2118  .  acetaminophen (TYLENOL) tablet 650 mg  650 mg Oral Q6H PRN Lenore Cordia, MD   650 mg at 06/14/20 1637   Or  . acetaminophen (TYLENOL) suppository 650 mg  650 mg Rectal Q6H PRN Lenore Cordia, MD      . ALPRAZolam Duanne Moron) tablet 0.25 mg  0.25 mg Oral TID PRN Ralene Muskrat B, MD      . Chlorhexidine Gluconate Cloth 2 % PADS 6 each  6 each  Topical Daily Deatra Kieon, MD   6 each at 06/14/20 1217  . enoxaparin (LOVENOX) injection 40 mg  40 mg Subcutaneous Q24H Dallie Piles, RPH   40 mg at 06/12/20 1801  . feeding supplement (ENSURE ENLIVE) (ENSURE ENLIVE) liquid 237 mL  237 mL Oral TID BM Shahmehdi, Seyed A, MD   237 mL at 06/14/20 1406  . finasteride (PROSCAR) tablet 5 mg  5 mg Oral QPM Shahmehdi, Seyed A, MD   5 mg at 06/14/20 1713  . hydrALAZINE (APRESOLINE) injection 10 mg  10 mg Intravenous Q4H PRN Shahmehdi, Seyed A, MD      . latanoprost (XALATAN) 0.005 % ophthalmic solution 1 drop  1 drop Both Eyes QHS Lenore Cordia, MD   1 drop at 06/14/20 2035  . loperamide HCl (IMODIUM) 1 MG/7.5ML suspension 1 mg  1 mg Oral PRN Ralene Muskrat B, MD      . MEDLINE mouth rinse  15 mL Mouth Rinse BID Shahmehdi, Seyed A, MD   15 mL at 06/14/20 0842  . ondansetron (ZOFRAN) tablet 4 mg  4 mg Oral Q6H PRN Lenore Cordia, MD       Or  . ondansetron (ZOFRAN) injection 4 mg  4 mg Intravenous Q6H PRN Zada Finders R, MD      . timolol (TIMOPTIC) 0.5 % ophthalmic solution 1 drop  1 drop Both Eyes Daily Zada Finders R, MD   1 drop at 06/15/20 1102    VITAL SIGNS: BP 139/70 (BP Location: Right Arm)   Pulse 87   Temp 98.4 F (36.9 C) (Oral)   Resp 18   Ht 6' (1.829 m)   Wt 157 lb 6.5 oz (71.4 kg)   SpO2 97%   BMI 21.35 kg/m  Filed Weights   06/12/20 0420 06/14/20 0445 06/15/20 0300  Weight: 157 lb 6.5 oz (71.4 kg) 156 lb 1.4 oz (70.8 kg) 157 lb 6.5 oz (71.4 kg)    Estimated body mass index is 21.35 kg/m as calculated from the following:   Height as of this encounter: 6' (1.829 m).   Weight as of this encounter: 157 lb 6.5 oz (71.4 kg).  LABS: CBC:    Component Value Date/Time   WBC 6.5 06/15/2020 0410   HGB 9.0 (L) 06/15/2020 0410   HGB 19.3 (H) 08/24/2012 1632   HCT 27.8 (L) 06/15/2020 0410   HCT 59.8 (H) 08/24/2012 1632   PLT 218 06/15/2020 0410   PLT 551 (H) 08/24/2012 1632   MCV 86.3 06/15/2020 0410   MCV 84  08/24/2012 1632   NEUTROABS 4.9 06/15/2020 0410   LYMPHSABS 0.9 06/15/2020 0410   MONOABS 0.3 06/15/2020 0410   EOSABS 0.1 06/15/2020 0410   BASOSABS 0.0 06/15/2020 0410   Comprehensive Metabolic Panel:    Component Value Date/Time   NA 130 (L) 06/14/2020 0436   NA 137 08/24/2012 1632   K 3.1 (L) 06/14/2020 0436   K 4.2 08/24/2012 1632   CL 101 06/14/2020 0436   CL 108 (H)  08/24/2012 1632   CO2 23 06/14/2020 0436   CO2 21 08/24/2012 1632   BUN 35 (H) 06/14/2020 0436   BUN 18 08/24/2012 1632   CREATININE 1.27 (H) 06/14/2020 0436   CREATININE 0.97 08/24/2012 1632   GLUCOSE 90 06/14/2020 0436   GLUCOSE 113 (H) 08/24/2012 1632   CALCIUM 10.3 06/14/2020 0436   CALCIUM 9.6 08/24/2012 1632   AST 27 06/04/2020 1006   AST 42 (H) 08/24/2012 1632   ALT 11 06/04/2020 1006   ALT 41 08/24/2012 1632   ALKPHOS 66 06/04/2020 1006   ALKPHOS 155 (H) 08/24/2012 1632   BILITOT 1.0 06/04/2020 1006   BILITOT 0.9 08/24/2012 1632   PROT 9.1 (H) 06/04/2020 1006   PROT 8.6 (H) 08/24/2012 1632   ALBUMIN 2.6 (L) 06/04/2020 1006   ALBUMIN 4.1 08/24/2012 1632    RADIOGRAPHIC STUDIES: DG Chest 1 View  Result Date: 06/11/2020 CLINICAL DATA:  Shortness of breath EXAM: CHEST  1 VIEW COMPARISON:  June 09, 2020 FINDINGS: Central catheter tip is in the superior vena cava. No pneumothorax. There is cardiomegaly with pulmonary vascularity within normal limits. There is airspace opacity in the left lower lobe. Elsewhere there is interstitial prominence in the mid lung regions. No appreciable adenopathy. No bone lesions. IMPRESSION: Airspace opacity consistent with pneumonia in the left lower lung region and to a lesser extent in each mid lung region. Appearance indicative of multifocal pneumonia. Heart remains prominent with pulmonary vascularity within normal limits. No adenopathy. Central catheter tip in superior vena cava without pneumothorax. Electronically Signed   By: Lowella Grip III M.D.   On:  06/11/2020 08:34   DG Chest 2 View  Result Date: 06/08/2020 CLINICAL DATA:  Chronic cough, shortness of breath EXAM: CHEST - 2 VIEW COMPARISON:  Prior chest x-ray 04/26/2020 FINDINGS: Tunneled right IJ central venous catheter present. The tip overlies the superior cavoatrial junction. Stable mild cardiomegaly. Mild pulmonary vascular congestion without overt edema. Significantly improved aeration compared to the prior radiograph. Small layering left pleural effusion with associated atelectasis. Stable chronic bronchitic changes and areas of linear scarring. No pneumothorax. No focal airspace infiltrate. IMPRESSION: 1. Overall, significantly improved aeration compared to prior imaging from 04/26/2020. 2. Stable cardiomegaly and pulmonary vascular congestion without overt edema. 3. Small left pleural effusion with associated atelectasis. 4. Tunneled right IJ central line in good position. Electronically Signed   By: Jacqulynn Cadet M.D.   On: 06/08/2020 14:04   NM Pulmonary Perfusion  Result Date: 06/11/2020 CLINICAL DATA:  Shortness of breath EXAM: NUCLEAR MEDICINE PERFUSION LUNG SCAN TECHNIQUE: Perfusion images were obtained in multiple projections after intravenous injection of radiopharmaceutical. Views: Anterior, posterior, left lateral, right lateral, RPO, LPO, RAO, LAO RADIOPHARMACEUTICALS:  4.37 mCi Tc-68mMAA IV COMPARISON:  Chest radiograph June 11, 2020 FINDINGS: There is slight decreased radiotracer uptake in each apex, nonsegmental. Elsewhere, the distribution of uptake on the perfusion study is normal. IMPRESSION: Slightly decreased radiotracer uptake in the apex regions, nonsegmental, a finding of questionable significance. This study is considered low probability for pulmonary embolus. Electronically Signed   By: WLowella GripIII M.D.   On: 06/11/2020 12:02   [place] Img Lower Bilateral (DVT)  Result Date: 06/09/2020 CLINICAL DATA:  Bilateral lower extremity pain 1 week. EXAM:  BILATERAL LOWER EXTREMITY VENOUS DOPPLER ULTRASOUND TECHNIQUE: Gray-scale sonography with compression, as well as color and duplex ultrasound, were performed to evaluate the deep venous system(s) from the level of the common femoral vein through the popliteal and proximal  calf veins. COMPARISON:  None. FINDINGS: VENOUS Normal compressibility of the common femoral, superficial femoral, and popliteal veins, as well as the visualized calf veins bilaterally. Visualized portions of profunda femoral vein and great saphenous vein unremarkable bilaterally. No filling defects to suggest DVT on grayscale or color Doppler imaging. Doppler waveforms show normal direction of venous flow, normal respiratory plasticity and response to augmentation bilaterally. OTHER None. Limitations: none IMPRESSION: Negative bilateral lower extremity Doppler evaluation. Electronically Signed   By: Marin Olp M.D.   On: 06/09/2020 17:09   IR Fluoro Guide CV Line Left  Result Date: 05/18/2020 CLINICAL DATA:  AML, needs venous access for treatment regimen. Renal insufficiency. EXAM: TUNNELED CENTRAL VENOUS CATHETER PLACEMENT WITH ULTRASOUND AND FLUOROSCOPIC GUIDANCE TECHNIQUE: The procedure, risks, benefits, and alternatives were explained to the patient. Questions regarding the procedure were encouraged and answered. The patient understands and consents to the procedure. Patency of the right IJ vein was confirmed with ultrasound with image documentation. An appropriate skin site was determined. Region was prepped using maximum barrier technique including cap and mask, sterile gown, sterile gloves, large sterile sheet, and Chlorhexidine as cutaneous antisepsis. The region was infiltrated locally with 1% lidocaine. Under real-time ultrasound guidance, the right IJ vein was accessed with a 21 gauge micropuncture needle; the needle tip within the vein was confirmed with ultrasound image documentation. 28F single-lumen cuffed PowerLine tunneled  from a right anterior chest wall approach to the dermatotomy site. Needle exchanged over the 018 guidewire for transitional dilator, through which the catheter which had been cut to 22 cm was advanced under intermittent fluoroscopy, positioned with its tip at the cavoatrial junction. Spot chest radiograph confirms good catheter position. No pneumothorax. Catheter was flushed per protocol. Catheter secured externally with O Prolene suture. The right IJ dermatotomy site was closed with Dermabond. COMPLICATIONS: COMPLICATIONS None immediate FLUOROSCOPY TIME:  Less than 0.1 minute; 0.25 mGy COMPARISON:  None IMPRESSION: 1. Technically successful placement of tunneled right IJ tunneled single-lumen power injectable catheter with ultrasound and fluoroscopic guidance. Ready for routine use. Electronically Signed   By: Lucrezia Europe M.D.   On: 05/18/2020 11:33   DG Chest Port 1 View  Result Date: 06/09/2020 CLINICAL DATA:  Dyspnea, tachypnea, tachycardia EXAM: PORTABLE CHEST 1 VIEW COMPARISON:  06/08/2020 chest radiograph. FINDINGS: Right internal jugular central venous catheter terminates in the lower third of the SVC. Stable cardiomediastinal silhouette with mild cardiomegaly. No pneumothorax. Small left pleural effusion is unchanged. No significant right pleural effusion. Diffuse hazy parahilar interstitial lung opacities, increased. IMPRESSION: 1. Stable mild cardiomegaly. Diffuse hazy parahilar interstitial lung opacities, increased, favor pulmonary edema. 2. Stable small left pleural effusion. Electronically Signed   By: Ilona Sorrel M.D.   On: 06/09/2020 15:21   CT BONE MARROW BIOPSY & ASPIRATION  Result Date: 06/12/2020 CLINICAL DATA:  Acute myeloid leukemia and need for bone marrow biopsy. EXAM: CT GUIDED BONE MARROW ASPIRATION AND BIOPSY ANESTHESIA/SEDATION: Versed 1.0 mg IV, Fentanyl 50 mcg IV Total Moderate Sedation Time:   10 minutes. The patient's level of consciousness and physiologic status were  continuously monitored during the procedure by Radiology nursing. PROCEDURE: The procedure risks, benefits, and alternatives were explained to the patient. Questions regarding the procedure were encouraged and answered. The patient understands and consents to the procedure. A time out was performed prior to initiating the procedure. The right gluteal region was prepped with chlorhexidine. Sterile gown and sterile gloves were used for the procedure. Local anesthesia was provided with 1% Lidocaine. Under CT  guidance, an 11 gauge On Control bone cutting needle was advanced from a posterior approach into the right iliac bone. Needle positioning was confirmed with CT. Initial non heparinized and heparinized aspirate samples were obtained of bone marrow. Core biopsy was performed via the On Control drill needle. COMPLICATIONS: None FINDINGS: Inspection of initial aspirate did reveal visible particles. Intact core biopsy sample was obtained. IMPRESSION: CT guided bone marrow biopsy of right posterior iliac bone with both aspirate and core samples obtained. Electronically Signed   By: Aletta Edouard M.D.   On: 06/12/2020 12:51    PERFORMANCE STATUS (ECOG) : 3 - Symptomatic, >50% confined to bed  Review of Systems Unless otherwise noted, a complete review of systems is negative.  Physical Exam General: NAD Pulmonary: Unlabored GU: Catheter in place Extremities: no edema, no joint deformities Skin: no rashes Neurological: Weakness but otherwise nonfocal  IMPRESSION: Follow-up visit requested by hospice liaison.  Patient has some transient confusion this morning.  Wife verbalized concerns with taking him home with hospice.  The alternative option of residential hospice has been discussed.  Yesterday, during Dr. Kem Parkinson meeting patient had verbalized a strong desire to go home.  Today, he was deferential to his wife.  Wife asked that I call patient's daughter who verbalized a strong opinion that  patient's final wishes should be honored and he will go home with hospice.  Patient's wife is going to speak with daughter and together make a decision.  Case was discussed with Dr. Mike Gip, Dr. Priscella Mann, hospice liaison, and SW.   PLAN: -Best supportive care -Hospice involvement at time of discharge (either home or IPU)  Case and plan discussed with Drs. Sreenath and Corcoran   Patient expressed understanding and was in agreement with this plan. He also understands that He can call the clinic at any time with any questions, concerns, or complaints.     Time Total: 35 minutes  Visit consisted of counseling and education dealing with the complex and emotionally intense issues of symptom management and palliative care in the setting of serious and potentially life-threatening illness.Greater than 50%  of this time was spent counseling and coordinating care related to the above assessment and plan.  Signed by: Altha Harm, PhD, NP-C

## 2020-06-15 NOTE — Progress Notes (Addendum)
°   06/15/20 0800  Clinical Encounter Type  Visited With Patient;Family;Health care provider  Visit Type Follow-up;Spiritual support;Social support  Referral From Chaplain  Consult/Referral To Chaplain  While standing at nurse's station chaplain and staff heard pt yelling and went in to see about him. Pt was on the telephone with his wife, who is upset that staff won't talk to her over the phone. It was explained that she needs a password on pt's file. Pt's wife is upset that others this week have spoken with her and now those on today won't. Pt wife said she understands the proto-call. Pt wife is rather frizzled, she is home waiting for hospice to come and prepare the house for her dying husband. This is a difficult time for her. Chaplain promised to check in on her husband throughout the day.

## 2020-06-15 NOTE — Progress Notes (Addendum)
   06/15/20 0315  Clinical Encounter Type  Visited With Patient and family together  Visit Type Follow-up  Referral From Chaplain  Consult/Referral To Chaplain  Chaplain checked back in on pt. When chaplain arrived at the room, pt's wife was at bedside saying he wasn't responsive. Pt's wife said that she was afraid to leave with him like this. Chaplain asked the nurse to come in. Nurse checked pt, said something to him and he responded. Pt's wife talked with chaplain for a few minutes and than chaplain shared a story about her mother transitioning. She shared how all her mother wanted was to go home. And that appears to be what the pt wants. Hearing the pt say this confirmed that hte right decisions had been made regarding him being discharged and taken home. Pt's wife thanked Clinical biochemist for sharing her story. Chaplain assured pt's wife that she would check on pt several times throughout the night.

## 2020-06-15 NOTE — Progress Notes (Addendum)
   06/15/20 2140  Clinical Encounter Type  Visited With Patient  Visit Type Follow-up  Referral From Chaplain  Consult/Referral To Chaplain  Staying true to her word. Chaplain Roberts Bon checked on Ruben [name] again. When she entered the room, she thought he was sleep, but saw that he was quietly laying there. When asked how he was feeling, Ruben [name] said something that chaplain could not understand. Chaplain touched Ruben [name]' hand and began praying for him. During the prayer, he closed his eyes. Chaplain Rinda Rollyson prayed for peace and a restful night. After praying, chaplain quietly stepped out of the room.

## 2020-06-15 NOTE — Progress Notes (Addendum)
   06/15/20 1300  Clinical Encounter Type  Visited With Patient and family together;Other (Comment)  Visit Type Follow-up;Spiritual support  Referral From Chaplain  Consult/Referral To Chaplain  Chaplain stopped by to check on pt. When she arrived pt's wife, daughter, and associate minister were at bedside. Santiago Glad of palliative care came in and gave pt's wife some information. Pt's wife shared many pictures with chaplain and minister. Chaplain talked with wife and associate minister for a while, while daughter was talking with Santiago Glad. When daughter came back in the room, chaplain inquired about how she ws doing and she said fine, but she is concerned about her mother. Pt's daughter needed to charge her telephone and chap.ain gave her a Games developer to use. Chaplain made known her availability. When chaplain left, wife was feeding pt his lunch. Chaplain will check back in on pt later. Per family, pt will be discharged tomorrow at needed equipment is delivered to the home.

## 2020-06-15 NOTE — Progress Notes (Addendum)
   06/15/20 1951  Clinical Encounter Type  Visited With Patient and family together  Visit Type Follow-up  Referral From Chaplain  Consult/Referral To Chaplain  Chaplain stopped by to check on pt and his daughter was at the door. Chaplain went in and spoke to pt. Daughter said pt ate a little soup and he was holding a cup of tea that he was drinking. Chaplain commented on the [facility] that he appears to be. Telling him that he seems to be gentle and kind. Chaplain told pt that she would come back to check on him later.

## 2020-06-15 NOTE — Progress Notes (Signed)
PROGRESS NOTE    Ruben [name]  ZOX:[id number] DOB: 11/30/1945 DOA: 06/08/2020 PCP: [name], MD   Brief Narrative:  74 year old male with history of AML on active chemotherapy admitted for weakness and fever.  Fever has resolved.  No clear infectious focus.  Oncology following.  Patient status post bone marrow biopsy to guide further treatment.  Deciding between hospice care and discharge to skilled nursing facility.  Decision making will depend upon results of bone marrow biopsy.  Lengthy discussion with palliative care nurse practitioner Billey Chang, hospice liaison Santiago Glad, patient, patient's wife.  Seems to be some confusion regarding disposition at this time.  Initially the plan was to go home however the wife was concerned that his care needs may be too much for her to handle.  We offered an inpatient hospice unit.  After repeated and lengthy discussions the decision was been made to go home with hospice services.  DME to be delivered to the house 06/16/2020.    Assessment & Plan:   Principal Problem:   SIRS (systemic inflammatory response syndrome) (HCC) Active Problems:   BPH (benign prostatic hyperplasia)   Crohn disease (HCC)   Iron deficiency anemia   CKD (chronic kidney disease) stage 3, GFR 30-59 ml/min   Acute myeloid leukemia not having achieved remission (HCC)   Sepsis (HCC)   Protein-calorie malnutrition, severe   Palliative care encounter  Brief acute respiratory failure Acute respiratory distress x2 since admission... Stabilized now off supplemental oxygen On room air satting 100% -Stable now  -Acute respiratory failure likely due to volume overload -IV fluid given to the sepsis, sepsis protocol without bolus -on 06/09/2020 patient developed hypoxia, tachypnea, tachycardia, T-max of 100.4 Was thought was due to volume overload, patient was anxious, small dose of 0.5 Ativan given, patient was placed on nonrebreather mask, 40 mg of Lasix was given Patient  responded well to treatment, currently has been weaned off oxygen, vitals are stable Plan: Every shift vitals Oxygen if necessary  Sepsis/SIRS - POA -upper respiratory symptoms (immunocompromised)/likely pneumonia Chest x-ray imaging consistent with possible pneumonia -patient is vaccinated, on admission SARS-CoV-2 screen negative Brief acute respiratory failure 06/09/2020, 06/10/2020 (T-max 100.4) -Improved sepsis physiology -Patient met the sepsis criteria on admission with temp of 100.9, tachypneic with respiratory rate of 23, tachycardic heart rate as high as 126,, creatinine elevated to 1.41 (immunocompromised patient) -Afebrile normotensive now Has been on IV antibiotics of vancomycin Valtrex,Flagyl, cefepime -Narrow antibiotics vancomycin Valtrex was discontinued due to elevated creatinine -Cultures remain negative, Flagyl also discontinued 06/12/2020 Plan: Stop all antibiotics.  Patient received 5 days of broad-spectrum with no clear infectious focus.  Could be malignancy related fever.  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 Mike Gip.  -On chemotherapy with azacitidine and venetoclax. -on  venetoclax  -Bone biopsy 06/12/2020 Plan: After multiple discussions with myself, palliative care, hospice liaison, primary oncologist the patient and wife have elected to discontinue cancer specific therapies and return home with hospice services.  Hospice this DME to be delivered to patient's home 06/16/2020.  We will plan to monitor in-house overnight and discharge home with hospice services on 06/16/2020.  Anemia of chronic disease and iron deficiency: -Stable monitoring H&H Has been requiring frequent transfusions as an outpatient.   CKD stage IIIb: - Cr.  1.37, 1.41 >>  1.29 >>1.60 >> 1.71 >>   1.46 >>1.47  Chronic appears stable. Continue monitor.  Crohn's disease: Has intermittent diarrhea, currently stable Stable no signs of flareup  BPH with chronic urinary retention: -Remained stable Patient self catheterizes.  Generalized weakness: Request PT/OT eval.  Ethics: DNR-patient confirms  --was entertained possible hospice After reviewing medical records, ongoing cancer, immunocompromise, presenting with source, sepsis ruled out.  Nevertheless due to comorbidities, patient frailty and age anticipated prognosis to be poor. Palliative care consult -determine goal of care.  See above.  Discharge home with hospice 06/16/2020   DVT prophylaxis: Lovenox Code Status: DNR Family Communication: Wife [name] at bedside Disposition Plan: Status is: Inpatient  Remains inpatient appropriate because:Unsafe d/c plan   Dispo: The patient is from: Home              Anticipated d/c is to: Home with hospice              Anticipated d/c date is: 1 day              Patient currently is medically stable to d/c.   This time patient is medically stable for discharge home with hospice services.  However DME will not be delivered patient home until 06/16/2020.  We will plan to keep in house overnight and discharged home tomorrow.   Consultants:   Oncology  Palliative care  Procedures:   Bone marrow biopsy, 06/12/2020  Antimicrobials:  None   Subjective: Seen and examined.   Lethargic somewhat confused this morning.  Objective: Vitals:   06/14/20 1521 06/14/20 2136 06/15/20 0300 06/15/20 0445  BP: 133/65 132/67  139/70  Pulse: 89 86  87  Resp: 20 18  18   Temp: 98.2 F (36.8 C) 98.4 F (36.9 C)  98.4 F (36.9 C)  TempSrc: Oral Oral  Oral  SpO2: 99% 94%  97%  Weight:   71.4 kg   Height:        Intake/Output Summary (Last 24 hours) at 06/15/2020 1316 Last data filed at 06/15/2020 0547 Gross per 24 hour  Intake 240 ml  Output 1550 ml  Net -1310 ml   Filed Weights    06/12/20 0420 06/14/20 0445 06/15/20 0300  Weight: 71.4 kg 70.8 kg 71.4 kg    Examination:  General exam: No acute distress, appears frail Respiratory system: Bibasilar crackles.  Normal work of breathing.  Room air Cardiovascular system: S1 & S2 heard, RRR. No JVD, murmurs, rubs, gallops or clicks. No pedal edema. Gastrointestinal system: Thin.  Nontender, nondistended.  Normal bowel sounds Central nervous system: Alert and oriented. No focal neurological deficits. Extremities: Symmetric 5 x 5 power. Skin: No rashes, lesions or ulcers Psychiatry: Judgement and insight appear normal. Mood & affect appropriate.     Data Reviewed: I have personally reviewed following labs and imaging studies  CBC: Recent Labs  Lab 06/10/20 0244 06/11/20 0530 06/12/20 0757 06/14/20 0436 06/15/20 0410  WBC 8.0 6.9 6.3 5.8 6.5  NEUTROABS 4.6 4.3 4.0 3.6 4.9  HGB 9.1* 8.7* 8.9* 8.4* 9.0*  HCT 28.6* 27.3* 27.7* 26.7* 27.8*  MCV 88.3 87.2 87.4 88.1 86.3  PLT 206 185 210 229 409   Basic Metabolic Panel: Recent Labs  Lab 06/09/20 0417 06/09/20 0417 06/10/20 0244 06/11/20 0530 06/12/20 0757 06/13/20 0634 06/14/20 0436  NA 130*   < > 132* 131* 130* 132* 130*  K 3.3*   < > 3.6 3.1* 2.9* 3.4* 3.1*  CL 98   < > 100 101 103 102 101  CO2 23   < > 22  22 22 22 23   GLUCOSE 84   < > 88 85 87 89 90  BUN 24*   < > 35* 38* 31* 35* 35*  CREATININE 1.29*   < > 1.60* 1.71* 1.46* 1.47* 1.27*  CALCIUM 8.3*   < > 8.7* 8.8* 9.2 10.3 10.3  MG 1.5*  --   --  1.9  --   --   --   PHOS  --   --   --  4.8*  --   --   --    < > = values in this interval not displayed.   GFR: Estimated Creatinine Clearance: 52.3 mL/min (A) (by C-G formula based on SCr of 1.27 mg/dL (H)). Liver Function Tests: No results for input(s): AST, ALT, ALKPHOS, BILITOT, PROT, ALBUMIN in the last 168 hours. No results for input(s): LIPASE, AMYLASE in the last 168 hours. Recent Labs  Lab 06/15/20 0410  AMMONIA 27   Coagulation  Profile: Recent Labs  Lab 06/09/20 0417  INR 1.7*   Cardiac Enzymes: No results for input(s): CKTOTAL, CKMB, CKMBINDEX, TROPONINI in the last 168 hours. BNP (last 3 results) No results for input(s): PROBNP in the last 8760 hours. HbA1C: No results for input(s): HGBA1C in the last 72 hours. CBG: No results for input(s): GLUCAP in the last 168 hours. Lipid Profile: No results for input(s): CHOL, HDL, LDLCALC, TRIG, CHOLHDL, LDLDIRECT in the last 72 hours. Thyroid Function Tests: No results for input(s): TSH, T4TOTAL, FREET4, T3FREE, THYROIDAB in the last 72 hours. Anemia Panel: No results for input(s): VITAMINB12, FOLATE, FERRITIN, TIBC, IRON, RETICCTPCT in the last 72 hours. Sepsis Labs: Recent Labs  Lab 06/08/20 1456 06/08/20 1514 06/09/20 0417 06/09/20 0856 06/09/20 1443 06/09/20 1833 06/10/20 0244 06/11/20 0530 06/12/20 0757  PROCALCITON   < >  --  34.65  --   --   --  >150.00 >150.00 105.10  LATICACIDVEN  --  2.2*  --  1.1 3.2* 1.5  --   --   --    < > = values in this interval not displayed.    Recent Results (from the past 240 hour(s))  Culture, blood (Routine X 2) w Reflex to ID Panel     Status: None   Collection Time: 06/08/20  3:14 PM   Specimen: BLOOD  Result Value Ref Range Status   Specimen Description   Final    BLOOD RIGHT ARM Performed at [facility], [address] [place], [place] [address]    Special Requests   Final    BOTTLES DRAWN AEROBIC AND ANAEROBIC Blood Culture adequate volume Performed at [facility], [address] [place], [place] [address]    Culture   Final    NO GROWTH 5 DAYS Performed at [facility], [address] [place], [place] [address]    Report Status 06/13/2020 FINAL  Final  Culture, blood (Routine X 2) w Reflex to ID Panel     Status: None   Collection Time: 06/08/20  3:33 PM   Specimen: BLOOD  Result Value Ref Range Status   Specimen Description   Final    BLOOD LEFT ARM Performed at  [facility], [address], [place], [place] [address]    Special Requests   Final    BOTTLES DRAWN AEROBIC AND ANAEROBIC Blood Culture adequate volume Performed at [facility], [address] [place], [place] [address]    Culture   Final    NO GROWTH 5 DAYS  Performed at [facility], [address] [place], [place] [address]    Report Status 06/13/2020 FINAL  Final  SARS Coronavirus 2 by RT PCR (hospital order, performed in [facility] hospital lab) Nasopharyngeal Nasopharyngeal Swab     Status: None   Collection Time: 06/08/20  5:23 PM   Specimen: Nasopharyngeal Swab  Result Value Ref Range Status   SARS Coronavirus 2 NEGATIVE NEGATIVE Final    Comment: (NOTE) SARS-CoV-2 target nucleic acids are NOT DETECTED.  The SARS-CoV-2 RNA is generally detectable in upper and lower respiratory specimens during the acute phase of infection. The lowest concentration of SARS-CoV-2 viral copies this assay can detect is 250 copies / mL. A negative result does not preclude SARS-CoV-2 infection and should not be used as the sole basis for treatment or other patient management decisions.  A negative result may occur with improper specimen collection / handling, submission of specimen other than nasopharyngeal swab, presence of viral mutation(s) within the areas targeted by this assay, and inadequate number of viral copies (<250 copies / mL). A negative result must be combined with clinical observations, patient history, and epidemiological information.  Fact Sheet for Patients:   StrictlyIdeas.no  Fact Sheet for Healthcare Providers: BankingDealers.co.za  This test is not yet approved or  cleared by the [place] FDA and has been authorized for detection and/or diagnosis of SARS-CoV-2 by FDA under an Emergency Use Authorization (EUA).  This EUA will remain in effect (meaning this test can be used) for the duration  of the COVID-19 declaration under Section 564(b)(1) of the Act, 21 U.S.C. section 360bbb-3(b)(1), unless the authorization is terminated or revoked sooner.  Performed at [facility], [address], [place], [name] [address]   Urine culture     Status: None   Collection Time: 06/08/20  5:23 PM   Specimen: Urine, Random  Result Value Ref Range Status   Specimen Description   Final    URINE, RANDOM Performed at [facility], [address], [place], [place] [address]    Special Requests   Final    NONE Performed at [facility], [address] [place], [place] [address]    Culture   Final    NO GROWTH Performed at [facility], [place] [address] [place], [place] [address]    Report Status 06/10/2020 FINAL  Final  MRSA PCR Screening     Status: None   Collection Time: 06/11/20  1:43 PM   Specimen: Nasopharyngeal  Result Value Ref Range Status   MRSA by PCR NEGATIVE NEGATIVE Final    Comment:        The GeneXpert MRSA Assay (FDA approved for NASAL specimens only), is one component of a comprehensive MRSA colonization surveillance program. It is not intended to diagnose MRSA infection nor to guide or monitor treatment for MRSA infections. Performed at [facility], [address] [place], [place] [address]          Radiology Studies: No results found.      Scheduled Meds: . Chlorhexidine Gluconate Cloth  6 each Topical Daily  . enoxaparin (LOVENOX) injection  40 mg Subcutaneous Q24H  . feeding supplement (ENSURE ENLIVE)  237 mL Oral TID BM  . finasteride  5 mg Oral QPM  . latanoprost  1 drop Both Eyes QHS  . mouth rinse  15 mL Mouth Rinse BID  . timolol  1 drop Both Eyes Daily   Continuous Infusions: . sodium chloride Stopped (  06/13/20 2118)     LOS: 6 days    Time spent: 25 minutes    Sidney Ace, MD [facility] Pager [phone]  If 7PM-7AM, please contact  night-coverage 06/15/2020, 1:16 PM

## 2020-06-16 ENCOUNTER — Other Ambulatory Visit: Payer: Self-pay | Admitting: Hospice and Palliative Medicine

## 2020-06-16 MED ORDER — HEPARIN SOD (PORK) LOCK FLUSH 100 UNIT/ML IV SOLN
500.0000 [IU] | Freq: Once | INTRAVENOUS | Status: DC
  Filled 2020-06-16: qty 5

## 2020-06-16 MED ORDER — MORPHINE SULFATE (CONCENTRATE) 10 MG /0.5 ML PO SOLN: 5.0000 mg | ORAL | 0 refills | Status: AC | PRN

## 2020-06-16 MED ORDER — LORAZEPAM 0.5 MG PO TABS: 0.5000 mg | ORAL_TABLET | Freq: Four times a day (QID) | ORAL | 0 refills | Status: AC | PRN

## 2020-06-16 NOTE — Progress Notes (Addendum)
   06/16/20 0800  Clinical Encounter Type  Visited With Patient  Visit Type Follow-up;Spiritual support  Referral From Chaplain  Consult/Referral To Chaplain  Chaplain checked in on pt. Pt is sleeping. She held his hand and whispered a quiet prayer and left.

## 2020-06-16 NOTE — Plan of Care (Signed)
The patient has been discharged home with hospice services. AVS package has been gone over and was provided the patient's daughter Manuela Schwartz. The patient was discharged with a the chest port. It was going to be removed and after talking with the family they wanted to keep the chest port. MD was notified and the patient and approved for the patient to be discharged with the chest port.  Problem: Education: Goal: Knowledge of General Education information will improve Description: Including pain rating scale, medication(s)/side effects and non-pharmacologic comfort measures Outcome: Completed/Met   Problem: Health Behavior/Discharge Planning: Goal: Ability to manage health-related needs will improve Outcome: Completed/Met   Problem: Clinical Measurements: Goal: Ability to maintain clinical measurements within normal limits will improve Outcome: Completed/Met Goal: Will remain free from infection Outcome: Completed/Met Goal: Diagnostic test results will improve Outcome: Completed/Met Goal: Respiratory complications will improve Outcome: Completed/Met Goal: Cardiovascular complication will be avoided Outcome: Completed/Met   Problem: Activity: Goal: Risk for activity intolerance will decrease Outcome: Completed/Met   Problem: Nutrition: Goal: Adequate nutrition will be maintained Outcome: Completed/Met   Problem: Coping: Goal: Level of anxiety will decrease Outcome: Completed/Met   Problem: Elimination: Goal: Will not experience complications related to bowel motility Outcome: Completed/Met Goal: Will not experience complications related to urinary retention Outcome: Completed/Met   Problem: Pain Managment: Goal: General experience of comfort will improve Outcome: Completed/Met   Problem: Safety: Goal: Ability to remain free from injury will improve Outcome: Completed/Met   Problem: Skin Integrity: Goal: Risk for impaired skin integrity will decrease Outcome: Completed/Met    Problem: Acute Rehab PT Goals(only PT should resolve) Goal: Pt Will Go Supine/Side To Sit Outcome: Completed/Met Goal: Pt Will Transfer Bed To Chair/Chair To Bed Outcome: Completed/Met Goal: Pt Will Perform Standing Balance Or Pre-Gait Outcome: Completed/Met Goal: Pt Will Ambulate Outcome: Completed/Met Goal: Pt Will Go Up/Down Stairs Outcome: Completed/Met   Problem: Malnutrition  (NI-5.2) Goal: Food and/or nutrient delivery Description: Individualized approach for food/nutrient provision. Outcome: Completed/Met

## 2020-06-16 NOTE — Discharge Summary (Signed)
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  Fever has resolved.  No clear infectious focus.  Oncology following.  Patient status post bone marrow biopsy to guide further treatment.  Deciding between hospice care and discharge to skilled nursing facility.  Decision making will depend upon results of bone marrow biopsy.  Lengthy discussion with palliative care nurse practitioner Billey Chang, hospice liaison Santiago Glad, patient, patient's wife.  Seems to be some confusion regarding disposition at this time.  Initially the plan was to go home however the wife was concerned that his care needs may be too much for her to handle.  We offered an inpatient hospice unit.  After repeated and lengthy discussions the decision was been made to go home with hospice services.  DME to be delivered to the house 06/16/2020.  Patient somewhat lethargic at the time of discharge.  Vital signs stable at this time.  Confirmation of DME delivery per [facility] and bedside nurse.  Patient discharged home with hospice services.  Discharge Diagnoses:  Principal Problem:   SIRS (systemic inflammatory response syndrome) (HCC) Active Problems:   BPH (benign prostatic hyperplasia)   Crohn disease (HCC)   Iron deficiency anemia   CKD (chronic kidney disease) stage 3, GFR 30-59 ml/min   Acute myeloid leukemia not having achieved remission (HCC)   Sepsis ([place])   Protein-calorie malnutrition, severe   Palliative care encounter  Brief acute respiratory failure Acute respiratory distress x2 since  admission.Marland KitchenMarland KitchenStabilized now off supplemental oxygen On room air satting 100% -Stabl[place]ow -Acute respiratory failure likely due to volume overload -IV fluid given to the sepsis, sepsis protocol without bolus -on 06/09/2020 patient developed hypoxia, tachypnea, tachycardia, T-max of 100.4 Was thought was due to volume overload, patient was anxious, small dose of 0.5 Ativan given, patient was placed on nonrebreather mask, 40 mg of Lasix was given Patient responded well to treatment, currently has been weaned off oxygen, vitals are stable Plan: Home with hospice.  Oxygen if necessary for patient comfort  Sepsis/SIRS - POA -upper respiratory symptoms (immunocompromised)/likely pneumonia Chest x-ray imaging consistent with possible pneumonia -patient is vaccinated, on admission SARS-CoV-2 screen negative Brief acute respiratory failure 06/09/2020,06/10/2020 (T-max 100.4) -Improved sepsis physiology -Patient met the sepsis criteria on admission with temp of 100.9, tachypneic with respiratory rate of 23, tachycardic heart rate as high as 126,, creatinine elevated to 1.41 (immunocompromised patient) -Afebrile normotensive now Has been on IV antibiotics of vancomycin Valtrex,Flagyl,cefepime -Narrow antibiotics vancomycin Valtrex was discontinued due to elevated creatinine -Cultures remain negative, Flagyl also discontinued 06/12/2020 Plan: Stop all antibiotics.  Patient received 5 days of broad-spectrum with no clear infectious focus.  Could be malignancy related fever.    Discharge home.  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 Mike Gip.  -On chemotherapy with azacitidine and venetoclax. -on venetoclax  -Bone biopsy 06/12/2020 Plan: After multiple discussions with myself, palliative care, hospice liaison, primary oncologist the patient and wife have elected  to discontinue cancer specific therapies and return home with hospice services.    DME delivery confirmation. patient discharged home with hospice services  Anemia of chronic disease and iron deficiency: -Stable monitoring H&H Has been requiring frequent transfusions as an outpatient.   CKD stage IIIb: - Cr. 1.37, 1.41 >>1.29 >>1.60 >>1.71 >>1.46 >>1.47   Crohn's disease: Has intermittent diarrhea, currently stable Stable no signs of flareup  BPH with chronic urinary retention: -Remained stable Patient self catheterizes. Will discharge home with indwelling Foley catheter  Generalized weakness: Request PT/OT eval.  Ethics: DNR-patient confirms--was entertained possible hospice After reviewing medical records, ongoing cancer, immunocompromise, presenting with source, sepsis ruled out. Nevertheless due to comorbidities, patient frailty and age anticipated prognosis to be poor. Palliative care consult -determine goal of care.  See above.  Discharge home with hospice 06/16/2020  Discharge Instructions  Discharge Instructions    Diet - low sodium heart healthy   Complete by: As directed    Increase activity slowly   Complete by: As directed    No wound care   Complete by: As directed    No wound care   Complete by: As directed      Allergies as of 06/16/2020   No Known Allergies     Medication List    STOP taking these medications   acetaminophen 500 MG tablet Commonly known as: TYLENOL   allopurinol 300 MG tablet Commonly known as: ZYLOPRIM   benzonatate 100 MG capsule Commonly known as: TESSALON   cyanocobalamin 5465 MCG tablet   folic acid 1 MG tablet Commonly known as: FOLVITE   levofloxacin 500 MG tablet Commonly known as: LEVAQUIN   midodrine 5 MG tablet Commonly known as: PROAMATINE   posaconazole 100 MG Tbec delayed-release tablet Commonly known as: NOXAFIL   potassium chloride SA 20 MEQ tablet Commonly known as: Klor-Con M20    RA Vitamin B-12 TR 1000 MCG Tbcr Generic drug: Cyanocobalamin   sodium bicarbonate 650 MG tablet   valACYclovir 500 MG tablet Commonly known as: VALTREX   Venclexta 100 MG Tabs Generic drug: venetoclax     TAKE these medications   finasteride 5 MG tablet Commonly known as: PROSCAR Take 1 tablet (5 mg total) by mouth every evening.   latanoprost 0.005 % ophthalmic solution Commonly known as: XALATAN Place 1 drop into both eyes at bedtime.   ondansetron 4 MG disintegrating tablet Commonly known as: ZOFRAN-ODT Take 1 tablet (4 mg total) by mouth every 8 (eight) hours as needed for nausea or vomiting.   timolol 0.5 % ophthalmic solution Commonly known as: TIMOPTIC Place 1 drop into both eyes daily. In AM       No Known Allergies  Consultations: Palliative care Oncology  Procedures/Studies: DG Chest 1 View  Result Date: 06/11/2020 CLINICAL DATA:  Shortness of breath EXAM: CHEST  1 VIEW COMPARISON:  June 09, 2020 FINDINGS: Central catheter tip is in the superior vena cava. No pneumothorax. There is cardiomegaly with pulmonary vascularity within normal limits. There is airspace opacity in the left lower lobe. Elsewhere there is interstitial prominence in the mid lung regions. No appreciable adenopathy. No bone lesions. IMPRESSION: Airspace opacity consistent with pneumonia in the left lower lung region and to a lesser extent in each mid lung region. Appearance indicative of multifocal pneumonia. Heart remains prominent with pulmonary vascularity within normal limits. No adenopathy. Central catheter tip in superior vena cava without pneumothorax. Electronically Signed   By: Lowella Grip III M.D.   On: 06/11/2020 08:34   DG Chest 2 View  Result Date: 06/08/2020 CLINICAL DATA:  Chronic cough, shortness of breath EXAM: CHEST - 2 VIEW  COMPARISON:  Prior chest x-ray 04/26/2020 FINDINGS: Tunneled right IJ central venous catheter present. The tip overlies the superior  cavoatrial junction. Stable mild cardiomegaly. Mild pulmonary vascular congestion without overt edema. Significantly improved aeration compared to the prior radiograph. Small layering left pleural effusion with associated atelectasis. Stable chronic bronchitic changes and areas of linear scarring. No pneumothorax. No focal airspace infiltrate. IMPRESSION: 1. Overall, significantly improved aeration compared to prior imaging from 04/26/2020. 2. Stable cardiomegaly and pulmonary vascular congestion without overt edema. 3. Small left pleural effusion with associated atelectasis. 4. Tunneled right IJ central line in good position. Electronically Signed   By: Jacqulynn Cadet M.D.   On: 06/08/2020 14:04   NM Pulmonary Perfusion  Result Date: 06/11/2020 CLINICAL DATA:  Shortness of breath EXAM: NUCLEAR MEDICINE PERFUSION LUNG SCAN TECHNIQUE: Perfusion images were obtained in multiple projections after intravenous injection of radiopharmaceutical. Views: Anterior, posterior, left lateral, right lateral, RPO, LPO, RAO, LAO RADIOPHARMACEUTICALS:  4.37 mCi Tc-60mMAA IV COMPARISON:  Chest radiograph June 11, 2020 FINDINGS: There is slight decreased radiotracer uptake in each apex, nonsegmental. Elsewhere, the distribution of uptake on the perfusion study is normal. IMPRESSION: Slightly decreased radiotracer uptake in the apex regions, nonsegmental, a finding of questionable significance. This study is considered low probability for pulmonary embolus. Electronically Signed   By: WLowella GripIII M.D.   On: 06/11/2020 12:02   UKoreaVenous Img Lower Bilateral (DVT)  Result Date: 06/09/2020 CLINICAL DATA:  Bilateral lower extremity pain 1 week. EXAM: BILATERAL LOWER EXTREMITY VENOUS DOPPLER ULTRASOUND TECHNIQUE: Gray-scale sonography with compression, as well as color and duplex ultrasound, were performed to evaluate the deep venous system(s) from the level of the common femoral vein through the popliteal and  proximal calf veins. COMPARISON:  None. FINDINGS: VENOUS Normal compressibility of the common femoral, superficial femoral, and popliteal veins, as well as the visualized calf veins bilaterally. Visualized portions of profunda femoral vein and great saphenous vein unremarkable bilaterally. No filling defects to suggest DVT on grayscale or color Doppler imaging. Doppler waveforms show normal direction of venous flow, normal respiratory plasticity and response to augmentation bilaterally. OTHER None. Limitations: none IMPRESSION: Negative bilateral lower extremity Doppler evaluation. Electronically Signed   By: DMarin OlpM.D.   On: 06/09/2020 17:09   IR Fluoro Guide CV Line Left  Result Date: 05/18/2020 CLINICAL DATA:  AML, needs venous access for treatment regimen. Renal insufficiency. EXAM: TUNNELED CENTRAL VENOUS CATHETER PLACEMENT WITH ULTRASOUND AND FLUOROSCOPIC GUIDANCE TECHNIQUE: The procedure, risks, benefits, and alternatives were explained to the patient. Questions regarding the procedure were encouraged and answered. The patient understands and consents to the procedure. Patency of the right IJ vein was confirmed with ultrasound with image documentation. An appropriate skin site was determined. Region was prepped using maximum barrier technique including cap and mask, sterile gown, sterile gloves, large sterile sheet, and Chlorhexidine as cutaneous antisepsis. The region was infiltrated locally with 1% lidocaine. Under real-time ultrasound guidance, the right IJ vein was accessed with a 21 gauge micropuncture needle; the needle tip within the vein was confirmed with ultrasound image documentation. 30F single-lumen cuffed PowerLine tunneled from a right anterior chest wall approach to the dermatotomy site. Needle exchanged over the 018 guidewire for transitional dilator, through which the catheter which had been cut to 22 cm was advanced under intermittent fluoroscopy, positioned with its tip at the  cavoatrial junction. Spot chest radiograph confirms good catheter position. No pneumothorax. Catheter was flushed per protocol. Catheter secured externally with O Prolene  suture. The right IJ dermatotomy site was closed with Dermabond. COMPLICATIONS: COMPLICATIONS None immediate FLUOROSCOPY TIME:  Less than 0.1 minute; 0.25 mGy COMPARISON:  None IMPRESSION: 1. Technically successful placement of tunneled right IJ tunneled single-lumen power injectable catheter with ultrasound and fluoroscopic guidance. Ready for routine use. Electronically Signed   By: Lucrezia Europe M.D.   On: 05/18/2020 11:33   DG Chest Port 1 View  Result Date: 06/09/2020 CLINICAL DATA:  Dyspnea, tachypnea, tachycardia EXAM: PORTABLE CHEST 1 VIEW COMPARISON:  06/08/2020 chest radiograph. FINDINGS: Right internal jugular central venous catheter terminates in the lower third of the SVC. Stable cardiomediastinal silhouette with mild cardiomegaly. No pneumothorax. Small left pleural effusion is unchanged. No significant right pleural effusion. Diffuse hazy parahilar interstitial lung opacities, increased. IMPRESSION: 1. Stable mild cardiomegaly. Diffuse hazy parahilar interstitial lung opacities, increased, favor pulmonary edema. 2. Stable small left pleural effusion. Electronically Signed   By: Ilona Sorrel M.D.   On: 06/09/2020 15:21   CT BONE MARROW BIOPSY & ASPIRATION  Result Date: 06/12/2020 CLINICAL DATA:  Acute myeloid leukemia and need for bone marrow biopsy. EXAM: CT GUIDED BONE MARROW ASPIRATION AND BIOPSY ANESTHESIA/SEDATION: Versed 1.0 mg IV, Fentanyl 50 mcg IV Total Moderate Sedation Time:   10 minutes. The patient's level of consciousness and physiologic status were continuously monitored during the procedure by Radiology nursing. PROCEDURE: The procedure risks, benefits, and alternatives were explained to the patient. Questions regarding the procedure were encouraged and answered. The patient understands and consents to the  procedure. A time out was performed prior to initiating the procedure. The right gluteal region was prepped with chlorhexidine. Sterile gown and sterile gloves were used for the procedure. Local anesthesia was provided with 1% Lidocaine. Under CT guidance, an 11 gauge On Control bone cutting needle was advanced from a posterior approach into the right iliac bone. Needle positioning was confirmed with CT. Initial non heparinized and heparinized aspirate samples were obtained of bone marrow. Core biopsy was performed via the On Control drill needle. COMPLICATIONS: None FINDINGS: Inspection of initial aspirate did reveal visible particles. Intact core biopsy sample was obtained. IMPRESSION: CT guided bone marrow biopsy of right posterior iliac bone with both aspirate and core samples obtained. Electronically Signed   By: Aletta Edouard M.D.   On: 06/12/2020 12:51    (Echo, Carotid, EGD, Colonoscopy, ERCP)    Subjective: Seen and examined on the day of discharge.  Lethargic.  Does answer questions but voice very weak.  Discharge Exam: Vitals:   06/15/20 1933 06/16/20 0423  BP: 133/67 107/66  Pulse: 90 (!) 103  Resp: 16 20  Temp: 98.2 F (36.8 C) 97.8 F (36.6 C)  SpO2: 97% 98%   Vitals:   06/15/20 1322 06/15/20 1933 06/16/20 0423 06/16/20 0500  BP: 133/67 133/67 107/66   Pulse: 85 90 (!) 103   Resp: 20 16 20    Temp: 98.5 F (36.9 C) 98.2 F (36.8 C) 97.8 F (36.6 C)   TempSrc: Oral Oral Oral   SpO2: 98% 97% 98%   Weight:    69.3 kg  Height:        General: Weak and deconditioned Cardiovascular: RRR, S1/S2 +, no rubs, no gallops Respiratory: Poor respiratory effort.  No adventitious sounds.  Room air Abdominal: Soft, NT, ND, bowel sounds + Extremities: no edema, no cyanosis    The results of significant diagnostics from this hospitalization (including imaging, microbiology, ancillary and laboratory) are listed below for reference.     Microbiology: Recent  Results (from the  past 240 hour(s))  Culture, blood (Routine X 2) w Reflex to ID Panel     Status: None   Collection Time: 06/08/20  3:14 PM   Specimen: BLOOD  Result Value Ref Range Status   Specimen Description   Final    BLOOD RIGHT ARM Performed at [facility], [address] [place], [place] [address]    Special Requests   Final    BOTTLES DRAWN AEROBIC AND ANAEROBIC Blood Culture adequate volume Performed at [facility], [address], [place], [place] [address]    Culture   Final    NO GROWTH 5 DAYS Performed at [facility], [address], [place], [place] [address]    Report Status 06/13/2020 FINAL  Final  Culture, blood (Routine X 2) w Reflex to ID Panel     Status: None   Collection Time: 06/08/20  3:33 PM   Specimen: BLOOD  Result Value Ref Range Status   Specimen Description   Final    BLOOD LEFT ARM Performed at [facility], [address], [place], [place] [address]    Special Requests   Final    BOTTLES DRAWN AEROBIC AND ANAEROBIC Blood Culture adequate volume Performed at [facility], [address] [place], [place] [address]    Culture   Final    NO GROWTH 5 DAYS Performed at [facility], [address], [place], [place] [address]    Report Status 06/13/2020 FINAL  Final  SARS Coronavirus 2 by RT PCR (hospital order, performed in [place] hospital lab) Nasopharyngeal Nasopharyngeal Swab     Status: None   Collection Time: 06/08/20  5:23 PM   Specimen: Nasopharyngeal Swab  Result Value Ref Range Status   SARS Coronavirus 2 NEGATIVE NEGATIVE Final    Comment: (NOTE) SARS-CoV-2 target nucleic acids are NOT DETECTED.  The SARS-CoV-2 RNA is generally detectable in upper and lower respiratory specimens during the acute phase of infection. The lowest concentration of SARS-CoV-2 viral copies this assay can detect is 250 copies / mL. A negative result does not preclude SARS-CoV-2 infection and should not be used as the  sole basis for treatment or other patient management decisions.  A negative result may occur with improper specimen collection / handling, submission of specimen other than nasopharyngeal swab, presence of viral mutation(s) within the areas targeted by this assay, and inadequate number of viral copies (<250 copies / mL). A negative result must be combined with clinical observations, patient history, and epidemiological information.  Fact Sheet for Patients:   StrictlyIdeas.no  Fact Sheet for Healthcare Providers: BankingDealers.co.za  This test is not yet approved or  cleared by the [place] FDA and has been authorized for detection and/or diagnosis of SARS-CoV-2 by FDA under an Emergency Use Authorization (EUA).  This EUA will remain in effect (meaning this test can be used) for the duration of the COVID-19 declaration under Section 564(b)(1) of the Act, 21 U.S.C. section 360bbb-3(b)(1), unless the authorization is terminated or revoked sooner.  Performed at [facility], [address] [place], [place] [address]   Urine culture     Status: None   Collection Time: 06/08/20  5:23 PM   Specimen: Urine, Random  Result Value Ref Range Status   Specimen Description   Final    URINE, RANDOM Performed at [facility], [address], [place], [place] [address]    Special Requests   Final  NONE Performed at [facility], [address] [place], [place] [address]    Culture   Final    NO GROWTH Performed at [facility], [place] [address] [place], [place] [address]    Report Status 06/10/2020 FINAL  Final  MRSA PCR Screening     Status: None   Collection Time: 06/11/20  1:43 PM   Specimen: Nasopharyngeal  Result Value Ref Range Status   MRSA by PCR NEGATIVE NEGATIVE Final    Comment:        The GeneXpert MRSA Assay (FDA approved for NASAL specimens only), is one component of  a comprehensive MRSA colonization surveillance program. It is not intended to diagnose MRSA infection nor to guide or monitor treatment for MRSA infections. Performed at [facility], [address] [place], [place] [address]      Labs: BNP (last 3 results) Recent Labs    06/09/20 0417 06/09/20 1443 06/11/20 0530  BNP 819.0* 494.2* 349.1*   Basic Metabolic Panel: Recent Labs  Lab 06/10/20 0244 06/11/20 0530 06/12/20 0757 06/13/20 0634 06/14/20 0436  NA 132* 131* 130* 132* 130*  K 3.6 3.1* 2.9* 3.4* 3.1*  CL 100 101 103 102 101  CO2 22 22 22 22 23   GLUCOSE 88 85 87 89 90  BUN 35* 38* 31* 35* 35*  CREATININE 1.60* 1.71* 1.46* 1.47* 1.27*  CALCIUM 8.7* 8.8* 9.2 10.3 10.3  MG  --  1.9  --   --   --   PHOS  --  4.8*  --   --   --    Liver Function Tests: No results for input(s): AST, ALT, ALKPHOS, BILITOT, PROT, ALBUMIN in the last 168 hours. No results for input(s): LIPASE, AMYLASE in the last 168 hours. Recent Labs  Lab 06/15/20 0410  AMMONIA 27   CBC: Recent Labs  Lab 06/10/20 0244 06/11/20 0530 06/12/20 0757 06/14/20 0436 06/15/20 0410  WBC 8.0 6.9 6.3 5.8 6.5  NEUTROABS 4.6 4.3 4.0 3.6 4.9  HGB 9.1* 8.7* 8.9* 8.4* 9.0*  HCT 28.6* 27.3* 27.7* 26.7* 27.8*  MCV 88.3 87.2 87.4 88.1 86.3  PLT 206 185 210 229 218   Cardiac Enzymes: No results for input(s): CKTOTAL, CKMB, CKMBINDEX, TROPONINI in the last 168 hours. BNP: Invalid input(s): POCBNP CBG: No results for input(s): GLUCAP in the last 168 hours. D-Dimer No results for input(s): DDIMER in the last 72 hours. Hgb A1c No results for input(s): HGBA1C in the last 72 hours. Lipid Profile No results for input(s): CHOL, HDL, LDLCALC, TRIG, CHOLHDL, LDLDIRECT in the last 72 hours. Thyroid function studies No results for input(s): TSH, T4TOTAL, T3FREE, THYROIDAB in the last 72 hours.  Invalid input(s): FREET3 Anemia work up No results for input(s): VITAMINB12, FOLATE, FERRITIN, TIBC,  IRON, RETICCTPCT in the last 72 hours. Urinalysis    Component Value Date/Time   COLORURINE YELLOW (A) 06/08/2020 1723   APPEARANCEUR CLEAR (A) 06/08/2020 1723   APPEARANCEUR Hazy (A) 11/08/2019 0947   LABSPEC 1.013 06/08/2020 1723   LABSPEC 1.021 08/24/2012 1636   PHURINE 6.0 06/08/2020 1723   GLUCOSEU NEGATIVE 06/08/2020 1723   GLUCOSEU Negative 08/24/2012 1636   HGBUR NEGATIVE 06/08/2020 1723   BILIRUBINUR NEGATIVE 06/08/2020 1723   BILIRUBINUR Negative 11/08/2019 0947   BILIRUBINUR Negative 08/24/2012 1636   KETONESUR NEGATIVE 06/08/2020 1723   PROTEINUR 100 (A) 06/08/2020 1723   UROBILINOGEN negative 09/03/2015 1124   NITRITE NEGATIVE 06/08/2020 1723   LEUKOCYTESUR NEGATIVE 06/08/2020 1723  LEUKOCYTESUR Negative 08/24/2012 1636   Sepsis Labs Invalid input(s): PROCALCITONIN,  WBC,  LACTICIDVEN Microbiology Recent Results (from the past 240 hour(s))  Culture, blood (Routine X 2) w Reflex to ID Panel     Status: None   Collection Time: 06/08/20  3:14 PM   Specimen: BLOOD  Result Value Ref Range Status   Specimen Description   Final    BLOOD RIGHT ARM Performed at [facility], [address] [place], [place] [address]    Special Requests   Final    BOTTLES DRAWN AEROBIC AND ANAEROBIC Blood Culture adequate volume Performed at [facility], [address], [place], [place] [address]    Culture   Final    NO GROWTH 5 DAYS Performed at [facility], [address], [place], [place] [address]    Report Status 06/13/2020 FINAL  Final  Culture, blood (Routine X 2) w Reflex to ID Panel     Status: None   Collection Time: 06/08/20  3:33 PM   Specimen: BLOOD  Result Value Ref Range Status   Specimen Description   Final    BLOOD LEFT ARM Performed at [facility], [address], [place], [place] [address]    Special Requests   Final    BOTTLES DRAWN AEROBIC AND ANAEROBIC Blood Culture adequate volume Performed at [facility], [address] [place], [place] [address]    Culture   Final    NO GROWTH 5 DAYS Performed at [facility], [address], [place], [place] [address]    Report Status 06/13/2020 FINAL  Final  SARS Coronavirus 2 by RT PCR (hospital order, performed in [place] hospital lab) Nasopharyngeal Nasopharyngeal Swab     Status: None   Collection Time: 06/08/20  5:23 PM   Specimen: Nasopharyngeal Swab  Result Value Ref Range Status   SARS Coronavirus 2 NEGATIVE NEGATIVE Final    Comment: (NOTE) SARS-CoV-2 target nucleic acids are NOT DETECTED.  The SARS-CoV-2 RNA is generally detectable in upper and lower respiratory specimens during the acute phase of infection. The lowest concentration of SARS-CoV-2 viral copies this assay can detect is 250 copies / mL. A negative result does not preclude SARS-CoV-2 infection and should not be used as the sole basis for treatment or other patient management decisions.  A negative result may occur with improper specimen collection / handling, submission of specimen other than nasopharyngeal swab, presence of viral mutation(s) within the areas targeted by this assay, and inadequate number of viral copies (<250 copies / mL). A negative result must be combined with clinical observations, patient history, and epidemiological information.  Fact Sheet for Patients:   StrictlyIdeas.no  Fact Sheet for Healthcare Providers: BankingDealers.co.za  This test is not yet approved or  cleared by the [place] FDA and has been authorized for detection and/or diagnosis of SARS-CoV-2 by FDA under an Emergency Use Authorization (EUA).  This EUA will remain in effect (meaning this test can be used) for the duration of the COVID-19 declaration under Section 564(b)(1) of the Act, 21 U.S.C. section 360bbb-3(b)(1), unless the authorization is terminated or revoked sooner.  Performed at [facility], [address] [place], [place] [address]   Urine culture     Status: None   Collection Time: 06/08/20  5:23 PM   Specimen: Urine, Random  Result Value Ref Range Status   Specimen Description   Final    URINE, RANDOM Performed at [facility],  [place], [place] [address]    Special Requests   Final    NONE Performed at [facility], [address] [place], [place] [address]    Culture   Final    NO GROWTH Performed at [facility], [place] [address] [place], [place] [address]    Report Status 06/10/2020 FINAL  Final  MRSA PCR Screening     Status: None   Collection Time: 06/11/20  1:43 PM   Specimen: Nasopharyngeal  Result Value Ref Range Status   MRSA by PCR NEGATIVE NEGATIVE Final    Comment:        The GeneXpert MRSA Assay (FDA approved for NASAL specimens only), is one component of a comprehensive MRSA colonization surveillance program. It is not intended to diagnose MRSA infection nor to guide or monitor treatment for MRSA infections. Performed at [facility], [address] [place], [place] [address]      Time coordinating discharge: Over 30 minutes  SIGNED:   Sidney Ace, MD  [facility] 06/16/2020, 10:59 AM Pager   If 7PM-7AM, please contact night-coverage

## 2020-06-16 NOTE — Progress Notes (Signed)
Patient was discharged home today. Spoke with hospice team who did an emergent admission as patient was reportedly febrile and declining. Rx sent for morphine and lorazepam for comfort. Reportedly, family want to keep him home although the option of transfer to hospice [facility] was also discussed.   Case and plan discussed with Dr. Mike Gip who was also going to speak with patient's wife.

## 2020-06-18 ENCOUNTER — Ambulatory Visit: Admit: 2020-06-18 | Payer: MEDICARE | Attending: Adult Health | Primary: Adult Health

## 2020-06-18 ENCOUNTER — Ambulatory Visit: Admit: 2020-06-18 | Payer: MEDICARE

## 2020-06-18 ENCOUNTER — Ambulatory Visit: Payer: Medicare Other

## 2020-06-18 ENCOUNTER — Ambulatory Visit: Payer: Medicare Other | Admitting: Hematology and Oncology

## 2020-06-18 ENCOUNTER — Other Ambulatory Visit: Payer: Medicare Other

## 2020-06-19 ENCOUNTER — Encounter (HOSPITAL_COMMUNITY): Payer: Self-pay | Admitting: Hematology and Oncology

## 2020-06-19 LAB — SURGICAL PATHOLOGY

## 2020-06-22 NOTE — Unmapped (Signed)
This patient has been disenrolled from the [facility] specialty pharmacy services due to patient is deceased.    Howard Villarreal  [facility] Specialty Pharmacist

## 2020-06-22 NOTE — Unmapped (Signed)
Patient is deceased.

## 2020-06-22 DEATH — deceased

## 2020-11-07 ENCOUNTER — Other Ambulatory Visit: Payer: Self-pay | Admitting: Urology

## 2021-10-31 IMAGING — CT CT CHEST W/O CM
1 of 3 series · 10 of 30 positions shown, 13 images · non-contrast
Comparison: None.

CLINICAL DATA: Cough and shortness of breath for 2-3 months.
Follow-up pneumonia. Pre bone marrow transplant.

EXAM:
CT CHEST WITHOUT CONTRAST
TECHNIQUE: Multidetector CT imaging of the chest was performed following the
standard protocol without IV contrast.

[Series 2: thorax · axial · 0.52mm/px · z∈[-570,-311]mm · 10 of 154 slices shown, 13 images]
[im 12/154  mediastinal]
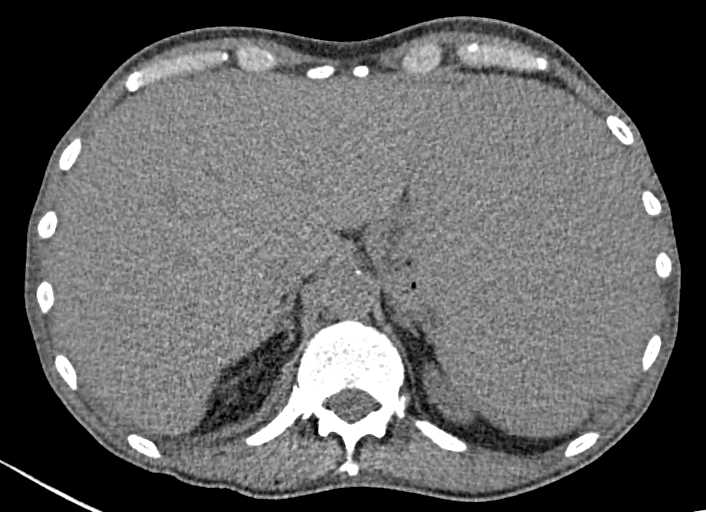
[im 12/154  lung]
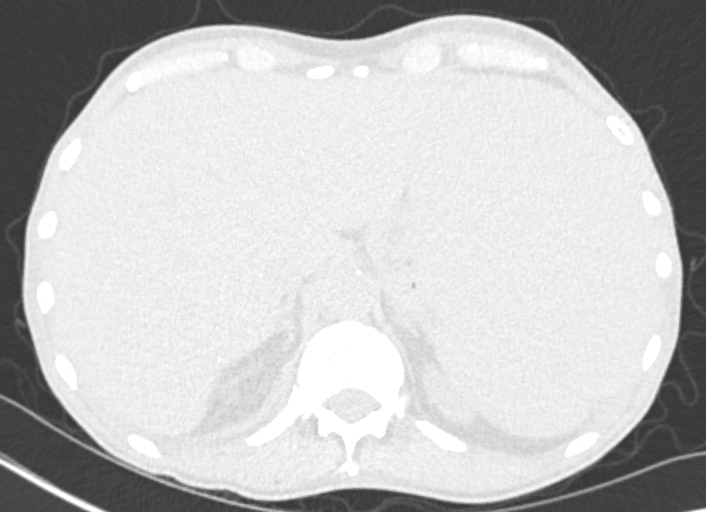
[im 24/154  lung]
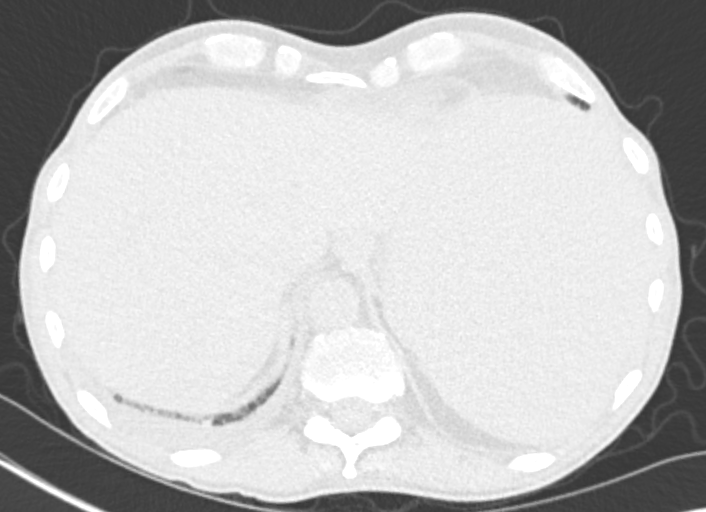
[im 48/154  lung]
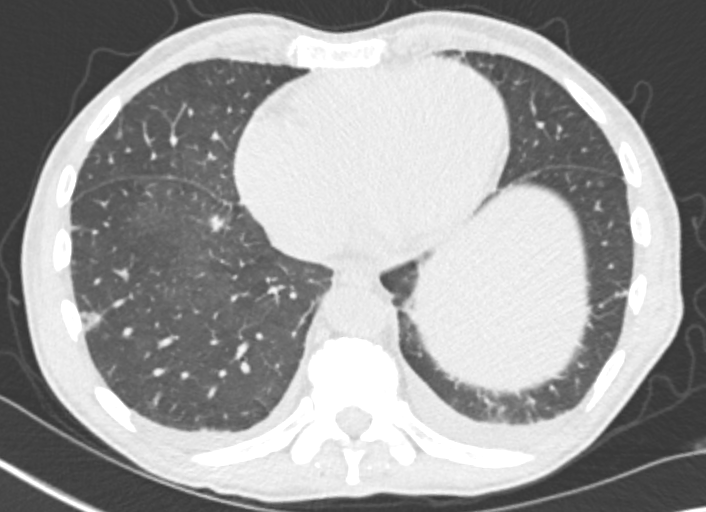
[im 59/154  lung]
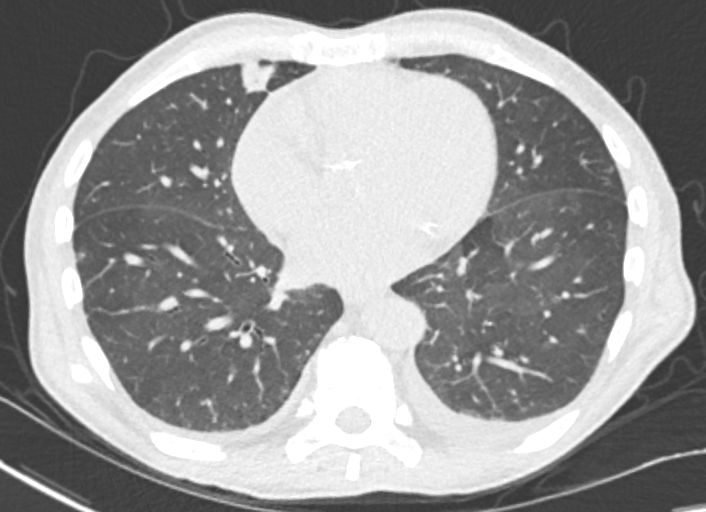
[im 71/154  mediastinal]
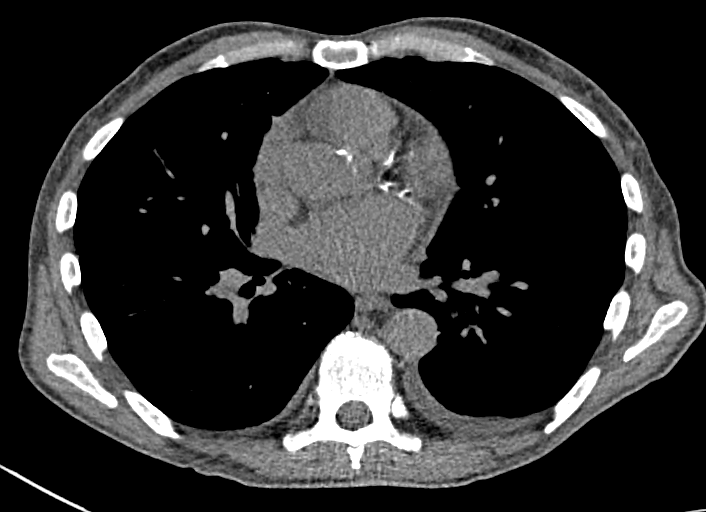
[im 71/154  lung]
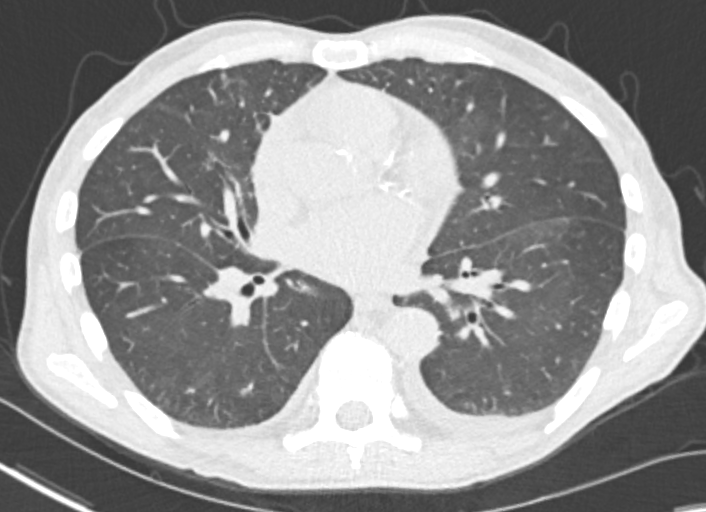
[im 83/154  lung]
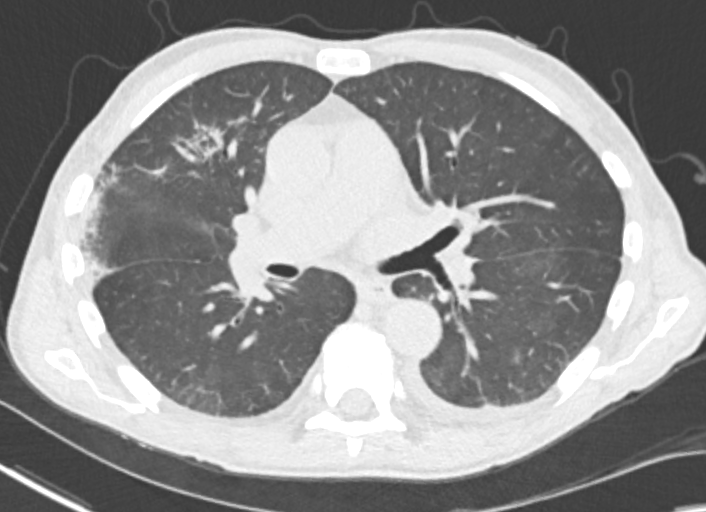
[im 95/154  lung]
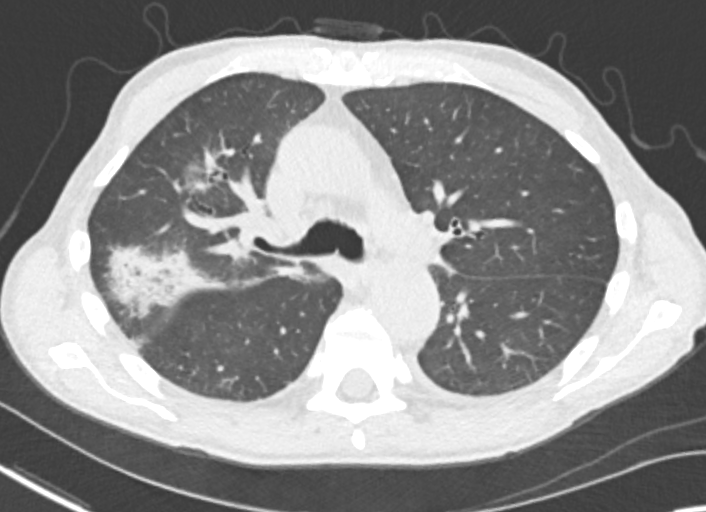
[im 118/154  lung]
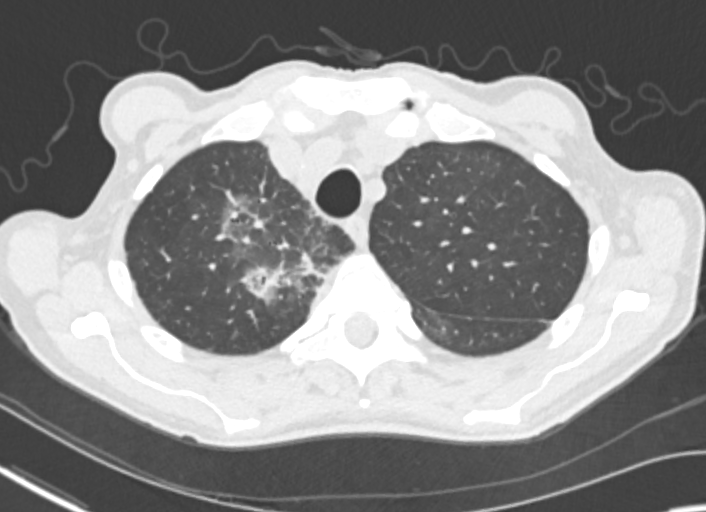
[im 130/154  mediastinal]
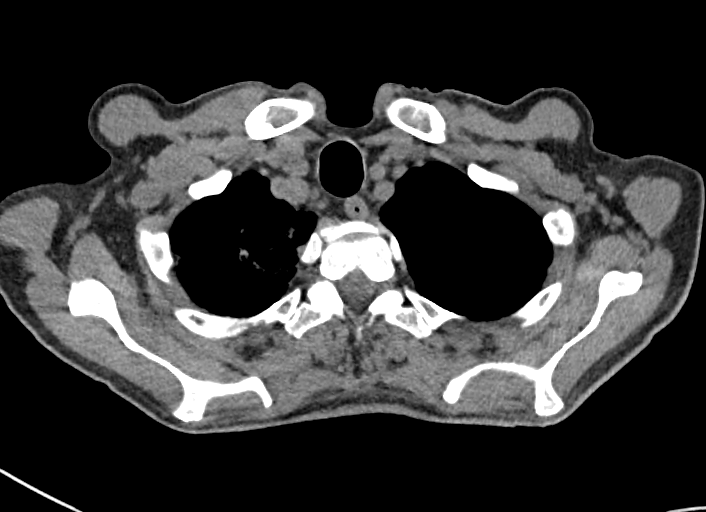
[im 130/154  lung]
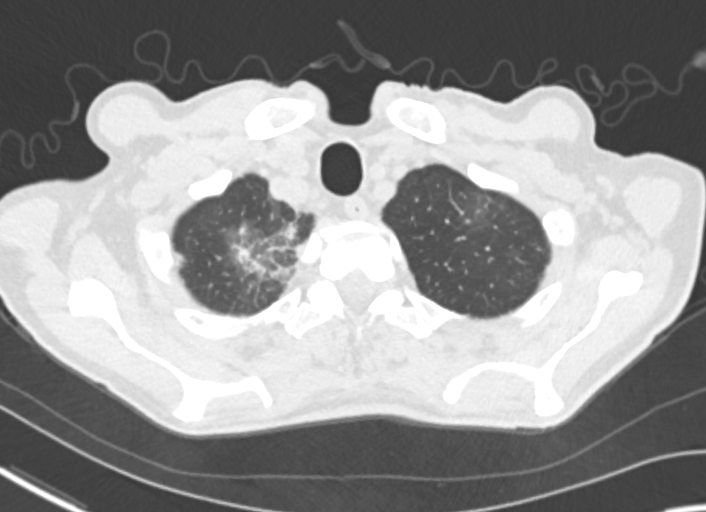
[im 142/154  lung]
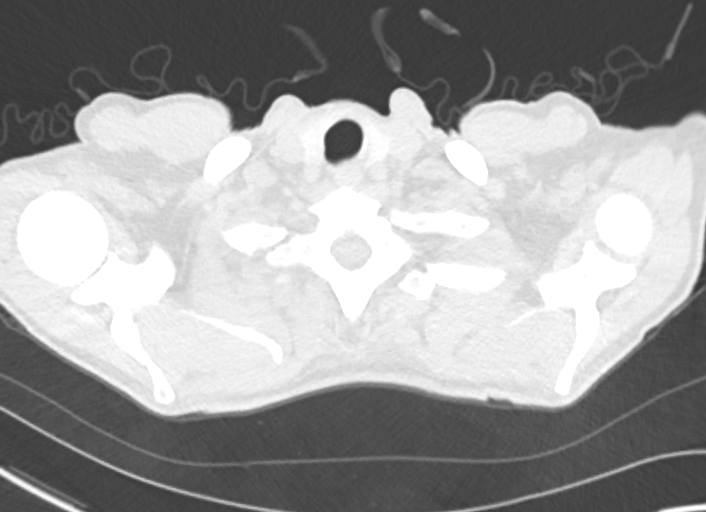

[10 of 30 positions shown; findings below may reference images not displayed]

FINDINGS: Cardiovascular: The heart size is normal. No substantial pericardial
effusion. Coronary artery calcification is evident. Atherosclerotic
calcification is noted in the wall of the thoracic aorta.

Mediastinum/Nodes: No mediastinal lymphadenopathy. No evidence for
gross hilar lymphadenopathy although assessment is limited by the
lack of intravenous contrast on today's study. The esophagus has
normal imaging features. There is no axillary lymphadenopathy.

Lungs/Pleura: Multifocal ground-glass and consolidative opacity is
identified in the lungs bilaterally, right greater than left. This
includes a 4.5 cm region of mixed attenuation airspace disease in
the right apex and a 6 cm focus of more consolidative airspace
disease in the posterior right upper lobe. Collapse/consolidative
opacity noted in the right middle lobe with nodular 12 mm opacity
identified in the right lower lobe on image 108/3. 17 mm
ground-glass opacity is identified in the left upper lobe on [DATE].
Other scattered tiny solid and sub solid pulmonary nodules are seen
in both lungs. Tiny bilateral pleural effusions evident.

Upper Abdomen: Spleen appears massively enlarged, markedly increased
comparing 04/03/2019 abdomen/pelvis CT but has been incompletely
visualized today.

Musculoskeletal: No worrisome lytic or sclerotic osseous
abnormality.
IMPRESSION: 1. Multifocal ground-glass and consolidative opacity in the lungs
bilaterally, right greater than left. Collapse/consolidative opacity
in the right middle lobe with nodular 12 mm opacity identified in
the right lower lobe. Imaging features compatible with multifocal
pneumonia.
2. Other scattered tiny solid and sub solid pulmonary nodules in
both lungs. Attention on follow-up recommended.
3. Tiny bilateral pleural effusions.
4. Massive splenomegaly, markedly increased in the interval since
04/03/2019 abdomen/pelvis CT. Incompletely visualized today.
5. Aortic Atherosclerosis ([id number]).

## 2021-11-04 ENCOUNTER — Encounter: Payer: Self-pay | Admitting: Internal Medicine
